# Patient Record
Sex: Male | Born: 1945
Health system: Southern US, Community
[De-identification: ages and names within clinical notes are randomized; demographics above are authoritative.]

## PROBLEM LIST (undated history)

## (undated) DIAGNOSIS — I471 Supraventricular tachycardia, unspecified: Secondary | ICD-10-CM

## (undated) DIAGNOSIS — I219 Acute myocardial infarction, unspecified: Secondary | ICD-10-CM

## (undated) DIAGNOSIS — I050 Rheumatic mitral stenosis: Secondary | ICD-10-CM

## (undated) DIAGNOSIS — K5792 Diverticulitis of intestine, part unspecified, without perforation or abscess without bleeding: Secondary | ICD-10-CM

## (undated) DIAGNOSIS — I2510 Atherosclerotic heart disease of native coronary artery without angina pectoris: Secondary | ICD-10-CM

## (undated) DIAGNOSIS — E785 Hyperlipidemia, unspecified: Secondary | ICD-10-CM

## (undated) DIAGNOSIS — I350 Nonrheumatic aortic (valve) stenosis: Secondary | ICD-10-CM

## (undated) DIAGNOSIS — I519 Heart disease, unspecified: Secondary | ICD-10-CM

## (undated) DIAGNOSIS — R011 Cardiac murmur, unspecified: Secondary | ICD-10-CM

## (undated) DIAGNOSIS — T4145XA Adverse effect of unspecified anesthetic, initial encounter: Secondary | ICD-10-CM

## (undated) DIAGNOSIS — I10 Essential (primary) hypertension: Secondary | ICD-10-CM

## (undated) DIAGNOSIS — K219 Gastro-esophageal reflux disease without esophagitis: Secondary | ICD-10-CM

## (undated) DIAGNOSIS — J398 Other specified diseases of upper respiratory tract: Secondary | ICD-10-CM

## (undated) DIAGNOSIS — E119 Type 2 diabetes mellitus without complications: Secondary | ICD-10-CM

## (undated) DIAGNOSIS — M1990 Unspecified osteoarthritis, unspecified site: Secondary | ICD-10-CM

## (undated) DIAGNOSIS — J189 Pneumonia, unspecified organism: Secondary | ICD-10-CM

## (undated) DIAGNOSIS — R739 Hyperglycemia, unspecified: Secondary | ICD-10-CM

## (undated) DIAGNOSIS — N184 Chronic kidney disease, stage 4 (severe): Secondary | ICD-10-CM

## (undated) DIAGNOSIS — N189 Chronic kidney disease, unspecified: Secondary | ICD-10-CM

## (undated) DIAGNOSIS — J45909 Unspecified asthma, uncomplicated: Secondary | ICD-10-CM

## (undated) DIAGNOSIS — G43009 Migraine without aura, not intractable, without status migrainosus: Secondary | ICD-10-CM

## (undated) DIAGNOSIS — T8859XA Other complications of anesthesia, initial encounter: Secondary | ICD-10-CM

## (undated) DIAGNOSIS — G4730 Sleep apnea, unspecified: Secondary | ICD-10-CM

## (undated) HISTORY — PX: CORONARY ARTERY BYPASS GRAFT: SHX141

## (undated) HISTORY — DX: Unspecified osteoarthritis, unspecified site: M19.90

## (undated) HISTORY — DX: Diverticulitis of intestine, part unspecified, without perforation or abscess without bleeding: K57.92

## (undated) HISTORY — PX: CATARACT EXTRACTION: SUR2

## (undated) HISTORY — PX: REPAIR KNEE LIGAMENT: SUR1188

## (undated) HISTORY — PX: TONSILLECTOMY: SUR1361

## (undated) HISTORY — DX: Acute myocardial infarction, unspecified: I21.9

## (undated) HISTORY — DX: Migraine without aura, not intractable, without status migrainosus: G43.009

## (undated) HISTORY — DX: Hyperlipidemia, unspecified: E78.5

## (undated) HISTORY — DX: Type 2 diabetes mellitus without complications: E11.9

## (undated) HISTORY — DX: Essential (primary) hypertension: I10

## (undated) HISTORY — DX: Hyperglycemia, unspecified: R73.9

## (undated) HISTORY — DX: Sleep apnea, unspecified: G47.30

## (undated) HISTORY — DX: Heart disease, unspecified: I51.9

## (undated) HISTORY — DX: Gastro-esophageal reflux disease without esophagitis: K21.9

## (undated) HISTORY — PX: OTHER SURGICAL HISTORY: SHX169

---
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
## 1994-04-30 ENCOUNTER — Encounter: Payer: Self-pay | Admitting: Cardiology

## 1994-05-03 ENCOUNTER — Encounter: Payer: Self-pay | Admitting: Cardiology

## 2007-06-01 ENCOUNTER — Encounter: Payer: Self-pay | Admitting: Family Medicine

## 2009-03-01 ENCOUNTER — Encounter: Payer: Self-pay | Admitting: Family Medicine

## 2009-04-12 ENCOUNTER — Ambulatory Visit: Payer: Self-pay | Admitting: Family Medicine

## 2009-04-12 ENCOUNTER — Encounter: Admission: RE | Admit: 2009-04-12 | Discharge: 2009-04-12 | Payer: Self-pay | Admitting: Family Medicine

## 2009-04-12 DIAGNOSIS — I10 Essential (primary) hypertension: Secondary | ICD-10-CM | POA: Insufficient documentation

## 2009-04-12 DIAGNOSIS — K219 Gastro-esophageal reflux disease without esophagitis: Secondary | ICD-10-CM | POA: Insufficient documentation

## 2009-04-12 DIAGNOSIS — I25708 Atherosclerosis of coronary artery bypass graft(s), unspecified, with other forms of angina pectoris: Secondary | ICD-10-CM | POA: Insufficient documentation

## 2009-04-12 DIAGNOSIS — E1169 Type 2 diabetes mellitus with other specified complication: Secondary | ICD-10-CM | POA: Insufficient documentation

## 2009-04-12 DIAGNOSIS — E119 Type 2 diabetes mellitus without complications: Secondary | ICD-10-CM | POA: Insufficient documentation

## 2009-04-12 DIAGNOSIS — Z8719 Personal history of other diseases of the digestive system: Secondary | ICD-10-CM | POA: Insufficient documentation

## 2009-04-12 DIAGNOSIS — I25810 Atherosclerosis of coronary artery bypass graft(s) without angina pectoris: Secondary | ICD-10-CM

## 2009-04-12 DIAGNOSIS — E785 Hyperlipidemia, unspecified: Secondary | ICD-10-CM

## 2009-04-12 DIAGNOSIS — N289 Disorder of kidney and ureter, unspecified: Secondary | ICD-10-CM | POA: Insufficient documentation

## 2009-04-12 DIAGNOSIS — M109 Gout, unspecified: Secondary | ICD-10-CM | POA: Insufficient documentation

## 2009-04-17 ENCOUNTER — Ambulatory Visit: Payer: Self-pay | Admitting: Cardiology

## 2009-04-19 LAB — CONVERTED CEMR LAB
ALT: 17 units/L (ref 0–53)
AST: 20 units/L (ref 0–37)
Albumin: 4.6 g/dL (ref 3.5–5.2)
Calcium: 9.5 mg/dL (ref 8.4–10.5)
Chloride: 103 meq/L (ref 96–112)
Hemoglobin: 14.2 g/dL (ref 13.0–17.0)
Hgb A1c MFr Bld: 6.1 % (ref 4.6–6.1)
Platelets: 221 10*3/uL (ref 150–400)
Potassium: 3.4 meq/L — ABNORMAL LOW (ref 3.5–5.3)
RDW: 12.9 % (ref 11.5–15.5)
WBC: 8.1 10*3/uL (ref 4.0–10.5)

## 2009-04-23 ENCOUNTER — Encounter: Payer: Self-pay | Admitting: Cardiology

## 2009-05-22 ENCOUNTER — Ambulatory Visit: Payer: Self-pay

## 2009-05-22 ENCOUNTER — Encounter: Payer: Self-pay | Admitting: Cardiology

## 2009-06-07 ENCOUNTER — Telehealth: Payer: Self-pay | Admitting: Family Medicine

## 2009-07-19 ENCOUNTER — Ambulatory Visit: Payer: Self-pay | Admitting: Family Medicine

## 2009-08-01 LAB — CONVERTED CEMR LAB
AST: 31 units/L (ref 0–37)
Albumin: 4.3 g/dL (ref 3.5–5.2)
Calcium: 9.8 mg/dL (ref 8.4–10.5)
Cholesterol: 166 mg/dL (ref 0–200)
Creatinine, Ser: 1.1 mg/dL (ref 0.4–1.5)
Glucose, Bld: 118 mg/dL — ABNORMAL HIGH (ref 70–99)
HDL: 42.2 mg/dL (ref >39.00)
Triglycerides: 163 mg/dL — ABNORMAL HIGH (ref 0.0–149.0)
VLDL: 32.6 mg/dL (ref 0.0–40.0)

## 2009-10-25 ENCOUNTER — Telehealth: Payer: Self-pay | Admitting: Family Medicine

## 2009-11-29 ENCOUNTER — Ambulatory Visit: Payer: Self-pay | Admitting: Cardiovascular Disease

## 2009-11-30 ENCOUNTER — Encounter: Payer: Self-pay | Admitting: Cardiovascular Disease

## 2009-12-04 ENCOUNTER — Telehealth (INDEPENDENT_AMBULATORY_CARE_PROVIDER_SITE_OTHER): Payer: Self-pay | Admitting: *Deleted

## 2009-12-05 ENCOUNTER — Encounter (HOSPITAL_COMMUNITY): Admission: RE | Admit: 2009-12-05 | Discharge: 2010-02-06 | Payer: Self-pay | Admitting: Cardiovascular Disease

## 2009-12-05 ENCOUNTER — Ambulatory Visit: Payer: Self-pay | Admitting: Cardiovascular Disease

## 2009-12-05 ENCOUNTER — Ambulatory Visit: Payer: Self-pay | Admitting: Internal Medicine

## 2009-12-05 ENCOUNTER — Ambulatory Visit: Payer: Self-pay

## 2009-12-10 ENCOUNTER — Telehealth: Payer: Self-pay | Admitting: Cardiovascular Disease

## 2009-12-11 LAB — CONVERTED CEMR LAB
Alkaline Phosphatase: 59 units/L (ref 39–117)
Bilirubin, Direct: 0.2 mg/dL (ref 0.0–0.3)
Direct LDL: 75.7 mg/dL
GFR calc non Af Amer: 58.67 mL/min (ref >60)
Glucose, Bld: 113 mg/dL — ABNORMAL HIGH (ref 70–99)
HDL: 43.1 mg/dL (ref >39.00)
Potassium: 3.8 meq/L (ref 3.5–5.1)
Sodium: 143 meq/L (ref 135–145)
Total Protein: 7.8 g/dL (ref 6.0–8.3)

## 2010-03-06 ENCOUNTER — Ambulatory Visit: Payer: Self-pay | Admitting: Family Medicine

## 2010-03-07 LAB — CONVERTED CEMR LAB
ALT: 17 units/L (ref 0–53)
Cholesterol: 193 mg/dL (ref 0–200)
HDL: 42.2 mg/dL (ref >39.00)
Triglycerides: 249 mg/dL — ABNORMAL HIGH (ref 0.0–149.0)

## 2010-03-13 ENCOUNTER — Ambulatory Visit: Payer: Self-pay | Admitting: Family Medicine

## 2010-03-15 ENCOUNTER — Ambulatory Visit: Payer: Self-pay | Admitting: Internal Medicine

## 2010-03-15 DIAGNOSIS — G4733 Obstructive sleep apnea (adult) (pediatric): Secondary | ICD-10-CM | POA: Insufficient documentation

## 2010-03-18 ENCOUNTER — Ambulatory Visit: Payer: Self-pay | Admitting: Internal Medicine

## 2010-04-17 ENCOUNTER — Encounter: Payer: Self-pay | Admitting: Internal Medicine

## 2010-04-17 ENCOUNTER — Ambulatory Visit (HOSPITAL_BASED_OUTPATIENT_CLINIC_OR_DEPARTMENT_OTHER): Admission: RE | Admit: 2010-04-17 | Discharge: 2010-04-17 | Payer: Self-pay | Admitting: Internal Medicine

## 2010-04-20 ENCOUNTER — Ambulatory Visit: Payer: Self-pay | Admitting: Internal Medicine

## 2010-05-14 ENCOUNTER — Ambulatory Visit: Payer: Self-pay | Admitting: Internal Medicine

## 2010-05-15 ENCOUNTER — Encounter: Payer: Self-pay | Admitting: Internal Medicine

## 2010-05-23 ENCOUNTER — Encounter: Payer: Self-pay | Admitting: Internal Medicine

## 2010-05-27 ENCOUNTER — Ambulatory Visit: Payer: Self-pay | Admitting: Cardiovascular Disease

## 2010-06-20 ENCOUNTER — Ambulatory Visit: Payer: Self-pay | Admitting: Internal Medicine

## 2010-07-03 ENCOUNTER — Telehealth: Payer: Self-pay | Admitting: Family Medicine

## 2010-07-05 ENCOUNTER — Encounter: Payer: Self-pay | Admitting: Family Medicine

## 2010-08-06 ENCOUNTER — Ambulatory Visit: Payer: Self-pay | Admitting: Internal Medicine

## 2010-08-12 ENCOUNTER — Ambulatory Visit: Payer: Self-pay | Admitting: Internal Medicine

## 2010-09-04 ENCOUNTER — Ambulatory Visit: Payer: Self-pay | Admitting: Family Medicine

## 2010-09-09 LAB — CONVERTED CEMR LAB
ALT: 27 units/L (ref 0–53)
AST: 26 units/L (ref 0–37)
BUN: 32 mg/dL — ABNORMAL HIGH (ref 6–23)
Chloride: 103 meq/L (ref 96–112)
GFR calc non Af Amer: 52.44 mL/min — ABNORMAL LOW (ref >60.00)
Hgb A1c MFr Bld: 6.4 % (ref 4.6–6.5)
Phosphorus: 3.2 mg/dL (ref 2.3–4.6)
Sodium: 141 meq/L (ref 135–145)
VLDL: 34 mg/dL (ref 0.0–40.0)

## 2010-09-11 ENCOUNTER — Ambulatory Visit
Admission: RE | Admit: 2010-09-11 | Discharge: 2010-09-11 | Payer: Self-pay | Source: Home / Self Care | Attending: Family Medicine | Admitting: Family Medicine

## 2010-10-01 ENCOUNTER — Ambulatory Visit
Admission: RE | Admit: 2010-10-01 | Discharge: 2010-10-01 | Payer: Self-pay | Source: Home / Self Care | Attending: Internal Medicine | Admitting: Internal Medicine

## 2010-10-08 NOTE — Assessment & Plan Note (Signed)
Summary: F/U BRONCHITIS/CLE   Vital Signs:  Patient profile:   65 year old male Weight:      231.75 pounds O2 Sat:      98 % on Room air Temp:     97.6 degrees F oral Pulse rate:   74 / minute Pulse rhythm:   regular BP sitting:   122 / 70  (left arm) Cuff size:   large  Vitals Entered By: Maudie Mercury Dance CMA ([place]) (August 12, 2010 12:14 PM)  O2 Flow:  Room air CC: Recheck-not better   History of Present Illness: CC: recheck  4wk hx cough.  Morning better, afternoon increased cough and feels chest tight.  Cough continued, intermittently productive of yellow sputum.  Seen last week, thought asthmatic bronchitis.  put on zpack and told to use albuterol.  No more HA.  Still using mucinex with fluid.  Able to sleep at night.   thinks albuterol is only thing that helps open chest up.  finished zpack, feels has helped some, but day after completed felt worse.  affecting ability to exercises as coughing fits occur when steps outside as well.  Using CPAP per pulm.   already taking cetirizine daily.  No fevers/chills.  No abd pain, no hemoptysis, weight stable (actually up), appetite ok.  No PNDrip or congestion.  No smokers at home, no asthma/COPD hx.  + h/o wheezing with bronchitis in past.  tends to get bronchitis in winter, then lasts several months before goes away.  4 wks ago went to [place], prior had root canal and completed course of amoxicillin.  Current Medications (verified): 1)  Lutein 20 Mg Caps (Lutein) .Marland Kitchen.. 1 By Mouth Once Daily 2)  Levitra 20 Mg Tabs (Vardenafil Hcl) .... As Needed 3)  Aspirin 81 Mg  Tabs (Aspirin) .... Take 1 Tablet By Mouth Once A Day 4)  Crestor 40 Mg Tabs (Rosuvastatin Calcium) .Marland Kitchen.. 1 Tab By Mouth Once Daily 5)  Zetia 10 Mg Tabs (Ezetimibe) .... Take One Tablet By Mouth Daily. 6)  Omeprazole 20 Mg Cpdr (Omeprazole) .... Take 1 Tablet By Mouth Two Times A Day 7)  Metoprolol Tartrate 50 Mg Tabs (Metoprolol Tartrate) .... 1/2  Tab Two Times A Day By  Mouth 8)  Allopurinol 100 Mg Tabs (Allopurinol) .... .one Tab By Mouth Two Times A Day 9)  Chlorthalidone 50 Mg Tabs (Chlorthalidone) .... 1/2 Tab By Mouth Daily 10)  Glucosamine 500 Mg Caps (Glucosamine Sulfate) .... 2 Tab Once Daily 11)  Fish Oil 1000 Mg Caps (Omega-3 Fatty Acids) .Marland Kitchen.. 1 Cap Two Times A Day 12)  Vitamin C 500 Mg  Tabs (Ascorbic Acid) .... Take 1 Tablet By Mouth Once A Day 13)  Vitamin D 1000 Unit  Tabs (Cholecalciferol) .... Take 1 Tablet By Mouth Once A Day 14)  Tylenol Extra Strength 500 Mg Tabs (Acetaminophen) .Marland Kitchen.. 1 Tab Two Times A Day 15)  Klor-Con M20 20 Meq Cr-Tabs (Potassium Chloride Crys Cr) .Marland Kitchen.. 1 Tab By Mouth Daily 16)  Cetirizine Hcl 10 Mg Tabs (Cetirizine Hcl) .Marland Kitchen.. 1 By Mouth Once Daily 17)  Cpap  11 [facility] 18)  Lisinopril 10 Mg Tabs (Lisinopril) .... Take One Tablet By Mouth Daily 19)  Ventolin Hfa 108 (90 Base) Mcg/act Aers (Albuterol Sulfate) .... 2 Puffs Every 4-6 Hours As Needed Sob  Allergies: 1)  ! Percocet  Past History:  Past Medical History: Last updated: 05/14/2010 1.  CAD s/p CABG 1997 x 5.  Done in [place], [place].  Myoview in  2006 in ? [place] was normal per patient.  2.  Diverticulitis, hx of 3.  Gout 4.  Hyperlipidemia 5.  Hypertension 6.  osteoarthritis: knee OA -- injections in past / cortisone  7.  GERD  8.  atypical migraine - only gets aura  9.  hyperglycemia -- borderline DM  10.  CKD 11. Obstructive sleep apnea- NPSG 04/17/10- Moderate OSA AHI 36.3/hr,,,,CPAP 2. Cough- PFT 03/18/10- Mild obstructive airways dz w/o resp to BD. FEV1 2.65/ 86%; Ratio 0.77  Social History: Last updated: 03/15/2010 Retired but [employer] with wife.  ReMarried- 1st wife died Alcohol use-yes 1 drink per day or less  never smoked - occasional cigar Drug use-no Regular exercise-yes-ballroom dancing , ping pong , yoga , Charlean Sanfilippo   Review of Systems       per HPI  Physical Exam  General:  Well-developed,well-nourished,in no acute  distress; alert,appropriate and cooperative throughout examination, nontoxic.  vitals reviewed and stable.  O2 sat 98%RA Head:  normocephalic, atraumatic, and no abnormalities observed.  sinuses NT Eyes:  perrla, eomi, no injection Ears:  TMs clear Nose:  nares clear, no congestion Mouth:  MMM, no pharyngeal erythema/exudates Neck:  resolving R AC LAD Lungs:  minimal exp wheezing, very slightly tight, no crackles/rales, normal WOB Heart:  Normal rate and regular rhythm. S1 and S2 normal without gallop, murmur, click, rub or other extra sounds. Pulses:  2+ rad pulses, brisk cap refill Extremities:  no pedal edema Skin:  Intact without suspicious lesions or rashes   Impression & Recommendations:  Problem # 1:  COUGH (ICD-786.2) previously thought asthmatic bronchitis, consistent with improvement with albuterol.  no need for further abx (s/p zpack.)  add prednisone for possible RAD component.  continue mucinex.  schedule albuterol.  red flags to return discussed.  On ACEI, but this isn't dry cough.  already on antihistamine for possible PND component.  h/o OSA on CPAP and doing well.  no h/o smoking.  check CXR given going on for 4 wks, recent international trip, continued cough despite abx.  nothing acute on my read.  advised will call if any change in plan based on radiologist's read.  Orders: T-2 View CXR (71020TC)  Complete Medication List: 1)  Lutein 20 Mg Caps (Lutein) .Marland Kitchen.. 1 by mouth once daily 2)  Levitra 20 Mg Tabs (Vardenafil hcl) .... As needed 3)  Aspirin 81 Mg Tabs (Aspirin) .... Take 1 tablet by mouth once a day 4)  Crestor 40 Mg Tabs (Rosuvastatin calcium) .Marland Kitchen.. 1 tab by mouth once daily 5)  Zetia 10 Mg Tabs (Ezetimibe) .... Take one tablet by mouth daily. 6)  Omeprazole 20 Mg Cpdr (Omeprazole) .... Take 1 tablet by mouth two times a day 7)  Metoprolol Tartrate 50 Mg Tabs (Metoprolol tartrate) .... 1/2  tab two times a day by mouth 8)  Allopurinol 100 Mg Tabs (Allopurinol)  .... .one tab by mouth two times a day 9)  Chlorthalidone 50 Mg Tabs (Chlorthalidone) .... 1/2 tab by mouth daily 10)  Glucosamine 500 Mg Caps (Glucosamine sulfate) .... 2 tab once daily 11)  Fish Oil 1000 Mg Caps (Omega-3 fatty acids) .Marland Kitchen.. 1 cap two times a day 12)  Vitamin C 500 Mg Tabs (Ascorbic acid) .... Take 1 tablet by mouth once a day 13)  Vitamin D 1000 Unit Tabs (Cholecalciferol) .... Take 1 tablet by mouth once a day 14)  Tylenol Extra Strength 500 Mg Tabs (Acetaminophen) .Marland Kitchen.. 1 tab two times a day 15)  Klor-con M20 20 Meq Cr-tabs (Potassium chloride crys cr) .Marland Kitchen.. 1 tab by mouth daily 16)  Cetirizine Hcl 10 Mg Tabs (Cetirizine hcl) .Marland Kitchen.. 1 by mouth once daily 17)  Cpap [facility]  18)  Lisinopril 10 Mg Tabs (Lisinopril) .... Take one tablet by mouth daily 19)  Ventolin Hfa 108 (90 Base) Mcg/act Aers (Albuterol sulfate) .... 2 puffs every 4-6 hours as needed sob 20)  Prednisone 50 Mg Tabs (Prednisone) .... Take one by mouth daily x 7 days  Patient Instructions: 1)  continued asthmatic bronchitis.  2)  Continue guaifenesin or mucinex. 3)  Push fluids and get plenty of rest. 4)  Use medication as prescribed: use albuterol inhaler 2 puffs every 4-6 hours scheduled for next 2 days, start steroid course x 7 days.  5)  Please return if you are not improving as expected, or if you have high fevers (>101.5) or difficulty swallowing/breathing. 6)  Call clinic with questions.  Pleasure to see you today!  Prescriptions: PREDNISONE 50 MG TABS (PREDNISONE) take one by mouth daily x 7 days  #7 x 0   Entered and Authorized by:   Ria Bush  MD   Signed by:   Ria Bush  MD on 08/12/2010   Method used:   Electronically to        [facility]. [phone]* (retail)       [address]       [place], [place]  [address]       Ph: [phone]       Fax: [phone]   RxID:   [id number]    Orders Added: 1)  Est. Patient Level III [id number] 2)  T-2 View CXR  [[id number]]    Current Allergies (reviewed today): ! PERCOCET

## 2010-10-08 NOTE — Letter (Signed)
Summary: CMN for CPAP Supplies/[facility]  CMN for CPAP Supplies/[facility]   Imported By: Phillis Knack 05/27/2010 14:14:01  _____________________________________________________________________  External Attachment:    Type:   Image     Comment:   External Document

## 2010-10-08 NOTE — Assessment & Plan Note (Signed)
Summary: FOLLOW UP AFTER LABS/RI   Vital Signs:  Patient profile:   65 year old male Height:      71 inches Weight:      218.25 pounds BMI:     30.55 Temp:     97.9 degrees F oral Pulse rate:   76 / minute Pulse rhythm:   regular BP sitting:   116 / 74  (left arm) Cuff size:   large  Vitals Entered By: Ozzie Hoyle LPN (July  6, 624THL [id number] AM) CC: f/u after labs and wants to discuss getting a sleep study   History of Present Illness: here for f/u of HTN and lipids and hyperglycemia  has been feeling fine - no complaints   bp in good control 116/74  lipids up a bit with trig 248 and LDL 109 (was 75)- cardiology wants it below 70 has not changed eating habits  eats red meat occas -- once every 2 weeks  little to no fried foods  eggs - - did eat some eggs for a while - back to egg whites now not much high fat dairy  stays away from shellfish  cardiologist mentioned adding another pill - ? zetia after stress test    AIC is down to 6.2- good control   wt is up 5 lb-- has increased recently - not exercising as much as he was - too busy    sleep issues --has dx of sleep apnea  was on cpap in past  this changes with his weight -- and trouble adjusting to it so has not used for 3-5 years  does startle his wife with breating and snoring -- not severe but comes and goes  is not overly tired during the day- but does fall asleep when he sits to read    Allergies: 1)  ! Percocet   Impression & Recommendations:  Problem # 1:  SLEEP APNEA (ICD-780.57) Assessment New ref for re- eval and tx to pulm  some snoring and witnessed apnea disc how this aff the CV system as well Orders: Sleep Disorder Referral (Sleep Disorder)  Problem # 2:  HYPERGLYCEMIA (ICD-790.29) Assessment: Improved  this continues to improve disc healthy diet (low simple sugar/ choose complex carbs/ low sat fat) diet and exercise in detail  lab and f/u in 6 mo  Labs Reviewed: Creat: 1.3 (12/05/2009)      Problem # 3:  HYPERLIPIDEMIA (ICD-272.4) Assessment: Deteriorated  this worsened despite good diet- may be genetic  pt is holding off on zetia until he speaks with his cardiologist again disc low sat fat diet- doing well with that  His updated medication list for this problem includes:    Crestor 40 Mg Tabs (Rosuvastatin calcium) .Marland Kitchen... 1 tab by mouth once daily    Niaspan 500 Mg Cr-tabs (Niacin (antihyperlipidemic)) .Marland Kitchen... Take 1 tablet by mouth once a day  Labs Reviewed: SGOT: 22 (03/06/2010)   SGPT: 17 (03/06/2010)   HDL:42.20 (03/06/2010), 43.10 (12/05/2009)  LDL:91 (07/19/2009), 85 (04/17/2009)  Chol:193 (03/06/2010), 145 (12/05/2009)  Trig:249.0 (03/06/2010), 207.0 (12/05/2009)  Complete Medication List: 1)  Lutein 20 Mg Caps (Lutein) .Marland Kitchen.. 1 by mouth once daily 2)  Levitra 20 Mg Tabs (Vardenafil hcl) .... As needed 3)  Aspirin 81 Mg Tabs (Aspirin) .... Take 1 tablet by mouth once a day 4)  Crestor 40 Mg Tabs (Rosuvastatin calcium) .Marland Kitchen.. 1 tab by mouth once daily 5)  Niaspan 500 Mg Cr-tabs (Niacin (antihyperlipidemic)) .... Take 1 tablet by mouth once a day  6)  Omeprazole 20 Mg Cpdr (Omeprazole) .... Take 1 tablet by mouth two times a day 7)  Metoprolol Tartrate 50 Mg Tabs (Metoprolol tartrate) .... 1/2  tab two times a day by mouth 8)  Allopurinol 100 Mg Tabs (Allopurinol) .... .one tab by mouth two times a day 9)  Chlorthalidone 50 Mg Tabs (Chlorthalidone) .... 1/2 tab by mouth daily 10)  Glucosamine 500 Mg Caps (Glucosamine sulfate) .... 2 tab once daily 11)  Fish Oil 1000 Mg Caps (Omega-3 fatty acids) .Marland Kitchen.. 1 cap two times a day 12)  Vitamin C 500 Mg Tabs (Ascorbic acid) .... Take 1 tablet by mouth once a day 13)  Vitamin D 1000 Unit Tabs (Cholecalciferol) .... Take 1 tablet by mouth once a day 14)  Tylenol Extra Strength 500 Mg Tabs (Acetaminophen) .Marland Kitchen.. 1 tab two times a day 15)  Klor-con M20 20 Meq Cr-tabs (Potassium chloride crys cr) .Marland Kitchen.. 1 tab by mouth daily 16)  Cetirizine  Hcl 10 Mg Tabs (Cetirizine hcl) .Marland Kitchen.. 1 by mouth once daily  Patient Instructions: 1)  we will do sleep clinic referral at check out  2)  no change in medicines  3)  schedule fasting labs in 6 months lipids/ast/alt/renal / AIC for hyperglycemia and lipids -- then follow up  4)  work hard on healthy diet and exercise - to loose the 5 lb you gained   Current Allergies (reviewed today): ! PERCOCET  Appended Document: FOLLOW UP AFTER LABS/RI     Allergies: 1)  ! Percocet  Past History:  Past Medical History: Last updated: 03/15/2010 1.  CAD s/p CABG 1997 x 5.  Done in [place], [place].  Myoview in 2006 in ? [place] was normal per patient.  2.  Diverticulitis, hx of 3.  Gout 4.  Hyperlipidemia 5.  Hypertension 6.  osteoarthritis: knee OA -- injections in past / cortisone  7.  GERD  8.  atypical migraine - only gets aura  9.  hyperglycemia -- borderline DM  10.  CKD 11. Obstructive sleep apnea 2. Cough  Past Surgical History: Last updated: 03/15/2010  1997-98 Open heart surgery (5 bypass) colonosc 2007 flex sig - divertic 09  EGD in past 2007 - gerd  Tonsillectomy Right knee ligament repair  Family History: Last updated: 04/12/2009 Family History of Heart disease (parent) father had stroke, and heart disease (in early 17s)  mother - alzheimers   Social History: Last updated: 03/15/2010 Retired but [employer] pl[employer] with wife.  ReMarried- 1st wife died Alcohol use-yes 1 drink per day or less  never smoked - occasional cigar Drug use-no Regular exercise-yes-ballroom dancing , ping pong , yoga , Richard Simmons   Risk Factors: Exercise: yes (04/12/2009)  Risk Factors: Smoking Status: never (03/15/2010)  Review of Systems General:  Complains of fatigue; denies loss of appetite and malaise. Eyes:  Denies blurring and eye irritation. CV:  Denies chest pain or discomfort, lightheadness, and palpitations. Resp:  Denies cough, sputum productive, and wheezing. GI:   Denies abdominal pain, bloody stools, change in bowel habits, and indigestion. GU:  Denies urinary frequency. MS:  Denies muscle aches and cramps. Derm:  Denies itching, lesion(s), poor wound healing, and rash. Neuro:  Denies numbness and tingling. Psych:  Denies anxiety and depression. Endo:  Denies cold intolerance, excessive thirst, excessive urination, and heat intolerance. Heme:  Denies abnormal bruising and bleeding.  Physical Exam  General:  Well-developed,well-nourished,in no acute distress; alert,appropriate and cooperative throughout examination Head:  normocephalic, atraumatic, and no abnormalities  observed.   Eyes:  vision grossly intact, pupils equal, pupils round, and pupils reactive to light.  no conjunctival pallor, injection or icterus  Nose:  no nasal discharge.   Mouth:  pharynx pink and moist.   Neck:  supple with full rom and no masses or thyromegally, no JVD or carotid bruit  Chest Wall:  No deformities, masses, tenderness or gynecomastia noted. Lungs:  Normal respiratory effort, chest expands symmetrically. Lungs are clear to auscultation, no crackles or wheezes. Heart:  Normal rate and regular rhythm. S1 and S2 normal without gallop, murmur, click, rub or other extra sounds. Abdomen:  Bowel sounds positive,abdomen soft and non-tender without masses, organomegaly or hernias noted. no renal bruits  Msk:  No deformity or scoliosis noted of thoracic or lumbar spine.   Pulses:  R and L carotid,radial,femoral,dorsalis pedis and posterior tibial pulses are full and equal bilaterally Extremities:  No clubbing, cyanosis, edema, or deformity noted with normal full range of motion of all joints.   Neurologic:  sensation intact to light touch, gait normal, and DTRs symmetrical and normal.   Skin:  Intact without suspicious lesions or rashes Cervical Nodes:  No lymphadenopathy noted Inguinal Nodes:  No significant adenopathy Psych:  normal affect, talkative and pleasant     Complete Medication List: 1)  Lutein 20 Mg Caps (Lutein) .Marland Kitchen.. 1 by mouth once daily 2)  Levitra 20 Mg Tabs (Vardenafil hcl) .... As needed 3)  Aspirin 81 Mg Tabs (Aspirin) .... Take 1 tablet by mouth once a day 4)  Crestor 40 Mg Tabs (Rosuvastatin calcium) .Marland Kitchen.. 1 tab by mouth once daily 5)  Niaspan 500 Mg Cr-tabs (Niacin (antihyperlipidemic)) .... Take 1 tablet by mouth once a day 6)  Omeprazole 20 Mg Cpdr (Omeprazole) .... Take 1 tablet by mouth two times a day 7)  Metoprolol Tartrate 50 Mg Tabs (Metoprolol tartrate) .... 1/2  tab two times a day by mouth 8)  Allopurinol 100 Mg Tabs (Allopurinol) .... .one tab by mouth two times a day 9)  Chlorthalidone 50 Mg Tabs (Chlorthalidone) .... 1/2 tab by mouth daily 10)  Glucosamine 500 Mg Caps (Glucosamine sulfate) .... 2 tab once daily 11)  Fish Oil 1000 Mg Caps (Omega-3 fatty acids) .Marland Kitchen.. 1 cap two times a day 12)  Vitamin C 500 Mg Tabs (Ascorbic acid) .... Take 1 tablet by mouth once a day 13)  Vitamin D 1000 Unit Tabs (Cholecalciferol) .... Take 1 tablet by mouth once a day 14)  Tylenol Extra Strength 500 Mg Tabs (Acetaminophen) .Marland Kitchen.. 1 tab two times a day 15)  Klor-con M20 20 Meq Cr-tabs (Potassium chloride crys cr) .Marland Kitchen.. 1 tab by mouth daily 16)  Cetirizine Hcl 10 Mg Tabs (Cetirizine hcl) .Marland Kitchen.. 1 by mouth once daily

## 2010-10-08 NOTE — Assessment & Plan Note (Signed)
Summary: sleep/cb   Copy to:  [facility] Provider/Referring Provider:  [name]  CC:  Sleep consult- Dr. Myrla Halsted study done in 2004 at [place] in [place].Marland Kitchen  History of Present Illness: March 15, 2010- 65 yoM seen with his wife today on kind referral by [name] because of breathing concerns with sleep.  He and second wife are newly married, heading for honeymoon prolonged cruise. Never smoker told by his wife that he stops breathing and gasps in his sleep. Occasionaklly  aware that he is stuggling his way up out of sleep and flails to get a breath. Snores, but not loudly. Nocturia x 2. Feels unrested. Denies obvious nocturnal wheeze or reflux. NPSG at [facility] in [facility] around 2004 with dx of OSA. Tried CPAP a few months but airflow bothered him and first wife. Dyspneic if he bends over, but no hx of asthma or routine wheeze. On waking in the AM he has bothersome cough with scant white sputum. Takes him an hour or two for this to pass, then he is mostly clear through the day. Not much nasal congestion. Hasn't needed extra pillows or noted swelling ankles. Hx treated GERD, MI with CABG, HTN, tonsillectomy.  Preventive Screening-Counseling & Management  Alcohol-Tobacco     Smoking Status: never  Comments: Occasional cigar  Current Medications (verified): 1)  Lutein 20 Mg Caps (Lutein) .Marland Kitchen.. 1 By Mouth Once Daily 2)  Levitra 20 Mg Tabs (Vardenafil Hcl) .... As Needed 3)  Aspirin 81 Mg  Tabs (Aspirin) .... Take 1 Tablet By Mouth Once A Day 4)  Crestor 40 Mg Tabs (Rosuvastatin Calcium) .Marland Kitchen.. 1 Tab By Mouth Once Daily 5)  Niaspan 500 Mg Cr-Tabs (Niacin (Antihyperlipidemic)) .... Take 1 Tablet By Mouth Once A Day 6)  Omeprazole 20 Mg Cpdr (Omeprazole) .... Take 1 Tablet By Mouth Two Times A Day 7)  Metoprolol Tartrate 50 Mg Tabs (Metoprolol Tartrate) .... 1/2  Tab Two Times A Day By Mouth 8)  Allopurinol 100 Mg Tabs (Allopurinol) .... .one Tab By Mouth Two Times A Day 9)  Chlorthalidone 50 Mg  Tabs (Chlorthalidone) .... 1/2 Tab By Mouth Daily 10)  Glucosamine 500 Mg Caps (Glucosamine Sulfate) .... 2 Tab Once Daily 11)  Fish Oil 1000 Mg Caps (Omega-3 Fatty Acids) .Marland Kitchen.. 1 Cap Two Times A Day 12)  Vitamin C 500 Mg  Tabs (Ascorbic Acid) .... Take 1 Tablet By Mouth Once A Day 13)  Vitamin D 1000 Unit  Tabs (Cholecalciferol) .... Take 1 Tablet By Mouth Once A Day 14)  Tylenol Extra Strength 500 Mg Tabs (Acetaminophen) .Marland Kitchen.. 1 Tab Two Times A Day 15)  Klor-Con M20 20 Meq Cr-Tabs (Potassium Chloride Crys Cr) .Marland Kitchen.. 1 Tab By Mouth Daily 16)  Cetirizine Hcl 10 Mg Tabs (Cetirizine Hcl) .Marland Kitchen.. 1 By Mouth Once Daily  Allergies (verified): 1)  ! 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  ReMarried- 1st wife died Alcohol use-yes 1 drink per day or less  never smoked - occasional cigar Drug use-no Regular exercise-yes-ballroom dancing , ping pong , yoga , Richard Simmons   Risk Factors: Exercise: yes (04/12/2009)  Risk Factors: Smoking Status: never (03/15/2010)  Past Medical History: 1.  CAD s/p CABG 1997 x 5.  Done in [place], [place].  Myoview in 2006 in ?  [place] was normal per patient.  2.  Diverticulitis, hx of 3.  Gout 4.  Hyperlipidemia 5.  Hypertension 6.  osteoarthritis: knee OA -- injections in past / cortisone  7.  GERD  8.  atypical migraine - only gets aura  9.  hyperglycemia -- borderline DM  10.  CKD 11. Obstructive sleep apnea 2. 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  ReMarried- 1st wife died Alcohol use-yes 1 drink per day or less  never smoked  - occasional cigar Drug use-no Regular exercise-yes-ballroom dancing , ping pong , yoga , Charlean Sanfilippo  Smoking Status:  never  Review of Systems      See HPI       The patient complains of productive cough.  The patient denies shortness of breath with activity, shortness of breath at rest, non-productive cough, coughing up blood, chest pain, irregular heartbeats, acid heartburn, indigestion, loss of appetite, weight change, abdominal pain, difficulty swallowing, sore throat, tooth/dental problems, headaches, nasal congestion/difficulty breathing through nose, sneezing, itching, ear ache, anxiety, depression, hand/feet swelling, joint stiffness or pain, rash, change in color of mucus, and fever.    Vital Signs:  Patient profile:   65 year old male Height:      71 inches Weight:      218 pounds BMI:     30.51 O2 Sat:      95 % on Room air Pulse rate:   59 / minute BP sitting:   116 / 74  (right arm) Cuff size:   large  Vitals Entered By: Clayborne Dana CMA (March 15, 2010 9:29 AM)  O2 Flow:  Room air CC: Sleep consult- Dr. Myrla Halsted study done in 2004 at [place] in [place].   Physical Exam  Additional Exam:  General: A/Ox3; pleasant and cooperative, NAD, SKIN: no rash, lesions NODES: no lymphadenopathy HEENT: [place]/AT, EOM- WNL, Conjuctivae- clear, PERRLA, TM-WNL, Nose- clear, Throat- clear and wnl, Mallampati  II, no stridor NECK: Supple w/ fair ROM, JVD- none, normal carotid impulses w/o bruits Thyroid- normal to palpation CHEST: Clear to P&A, no rales or cough HEART: RRR, no m/g/r heard ABDOMEN: Soft and nl; nml bowel sounds; no organomegaly or masses noted, mild overweight [id number], nl pulses, no edema, cyanosis or clubbing NEURO: Grossly intact to observation      Impression & Recommendations:  Problem # 1:  OBSTRUCTIVE SLEEP APNEA (ICD-327.23)  Previously dx'd in 2004 at [place]. Lost to f/u because CPAP wasn't adjusted to comfort. New wife is giving convincing  description. We discussed sleep study and he agrees to go ahead for update and confirmation of diagnosis. We began a discussion of hte medical concerns, emphasizing his responsibility to drive carefully.  Problem # 2:  COUGH (ICD-786.2)  Morning cough may reflect increased lung water overnight, bronhcitis or possibly some reflux. We have discussed and given sample trial of Spiriva to see what effect it has. Meanwhile we will schedule PFT. Several potential etiologies might be relieved by successful treatment of sleep apnea if that is confirmed.   Other Orders: Sleep Disorder Referral (Sleep Disorder) Consultation Level IV [id number])  Patient Instructions: 1)  Please schedule a follow-up appointment in 2 months. 2)  Our [facility] will contact you to schedule a sleep study 3)  Schedule PFT 4)  Sample trial  Spiriva, one daily

## 2010-10-08 NOTE — Progress Notes (Signed)
Summary: pt wants abx  Phone Note Call from Patient Call back at Home Phone [phone]   [name]: Patient Call For: Allena Earing MD Summary of Call: Pt's wife, Izora Gala,  was seen yesterday for bronchitis and given z-pack.  Pt is concerned because they are going out of the country next week and he is worried that he will get sick.  He is asking if he too can get an antibiotic to have just in case.  Uses [facility] in [place]. Initial call taken by: Marty Heck CMA, [place],  July 03, 2010 3:10 PM  Follow-up for Phone Call        sorry- I cannot do that ... where is he going -- is it a developed country where he could get healthcare if needed? Follow-up by: Allena Earing MD,  July 03, 2010 3:41 PM  Additional Follow-up for Phone Call Additional follow up Details #1::        Patient notified as instructed by telephone. Pt said was going to [place] and [place]. Pt to leave next Tues. Pt will call back if develops symptoms.Ozzie Hoyle LPN  October 26, 624THL 4:01 PM

## 2010-10-08 NOTE — Progress Notes (Signed)
Summary: Returning Call  Phone Note Call from Patient Call back at Home Phone [phone]   [name]: Patient Call For: RN Summary of Call: Patient called and said he was returning your call from this morning. Initial call taken by: Shanda Howells,  December 10, 2009 10:57 AM  Follow-up for Phone Call        pt aware of MV results. Follow-up by: Gabriel Cirri, RN, BSN,  December 10, 2009 11:13 AM

## 2010-10-08 NOTE — Assessment & Plan Note (Signed)
Summary: F6M/AMD   Visit Type:  Follow-up Referring Muzammil Bruins:  [facility] Primary Frazier Balfour:  [name]  CC:  Denies chest pain or shortness of breath..  History of Present Illness: 65 yo with history of CAD CABG back in 83 in [place], [place], HTN, hyperlipidemia, presents for routine followup.  Overall, he reports he is doing well. He is active, can walk 2 miles with no chest pain or shortness of breath. He has been gaining weight recently as he does travel, eats out frequently, goes on cruises. Previously he lost weight with [employer].  stress test in March of 2011 shows mildly decreased perfusion in the anterior wall at rest with moderately decreased perfusion with stress. He had no significant symptoms with testing.  EKG shows normal sinus rhythm with rate 61 beats per minute, no significant ST or T wave changes.     Current Medications (verified): 1)  Lutein 20 Mg Caps (Lutein) .Marland Kitchen.. 1 By Mouth Once Daily 2)  Levitra 20 Mg Tabs (Vardenafil Hcl) .... As Needed 3)  Aspirin 81 Mg  Tabs (Aspirin) .... Take 1 Tablet By Mouth Once A Day 4)  Crestor 40 Mg Tabs (Rosuvastatin Calcium) .Marland Kitchen.. 1 Tab By Mouth Once Daily 5)  Niaspan 500 Mg Cr-Tabs (Niacin (Antihyperlipidemic)) .... Take 1 Tablet By Mouth Once A Day 6)  Omeprazole 20 Mg Cpdr (Omeprazole) .... Take 1 Tablet By Mouth Two Times A Day 7)  Metoprolol Tartrate 50 Mg Tabs (Metoprolol Tartrate) .... 1/2  Tab Two Times A Day By Mouth 8)  Allopurinol 100 Mg Tabs (Allopurinol) .... .one Tab By Mouth Two Times A Day 9)  Chlorthalidone 50 Mg Tabs (Chlorthalidone) .... 1/2 Tab By Mouth Daily 10)  Glucosamine 500 Mg Caps (Glucosamine Sulfate) .... 2 Tab Once Daily 11)  Fish Oil 1000 Mg Caps (Omega-3 Fatty Acids) .Marland Kitchen.. 1 Cap Two Times A Day 12)  Vitamin C 500 Mg  Tabs (Ascorbic Acid) .... Take 1 Tablet By Mouth Once A Day 13)  Vitamin D 1000 Unit  Tabs (Cholecalciferol) .... Take 1 Tablet By Mouth Once A Day 14)  Tylenol Extra Strength 500 Mg  Tabs (Acetaminophen) .Marland Kitchen.. 1 Tab Two Times A Day 15)  Klor-Con M20 20 Meq Cr-Tabs (Potassium Chloride Crys Cr) .Marland Kitchen.. 1 Tab By Mouth Daily 16)  Cetirizine Hcl 10 Mg Tabs (Cetirizine Hcl) .Marland Kitchen.. 1 By Mouth Once Daily 17)  Cpap .... At Bedtime  Allergies (verified): 1)  ! Percocet  Past History:  Past Medical History: Last updated: 05/14/2010 1.  CAD s/p CABG 1997 x 5.  Done in [place], [place].  Myoview in 2006 in ? [place] was normal per patient.  2.  Diverticulitis, hx of 3.  Gout 4.  Hyperlipidemia 5.  Hypertension 6.  osteoarthritis: knee OA -- injections in past / cortisone  7.  GERD  8.  atypical migraine - only gets aura  9.  hyperglycemia -- borderline DM  10.  CKD 11. Obstructive sleep apnea- NPSG 04/17/10- Moderate OSA AHI 36.3/hr,,,,CPAP 2. Cough- PFT 03/18/10- Mild obstructive airways dz w/o resp to BD. 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  ReMarried- 1st wife died Alcohol use-yes 1 drink per day or less  never smoked - occasional cigar Drug use-no Regular exercise-yes-ballroom dancing , ping pong , yoga , Richard Simmons   Risk Factors: Exercise: yes (04/12/2009)  Risk Factors: Smoking Status: never (05/14/2010)  Review of Systems  The patient denies fever, weight loss, weight gain, vision loss, decreased hearing, hoarseness, chest pain, syncope, dyspnea on exertion, peripheral edema, prolonged cough, abdominal pain, incontinence, muscle weakness, depression, and enlarged lymph nodes.    Vital Signs:  Patient profile:   65 year old  male Height:      71 inches Weight:      222 pounds BMI:     31.07 Pulse rate:   71 / minute BP sitting:   132 / 85  (left arm) Cuff size:   regular  Vitals Entered By: Dolores Lory, CMA (May 27, 2010 9:26 AM)  Physical Exam  General:  Well developed, well nourished, in no acute distress. Head:  normocephalic and atraumatic Neck:  Neck supple, no JVD. No masses, thyromegaly or abnormal cervical nodes. Lungs:  Clear bilaterally to auscultation and percussion. Heart:  Non-displaced PMI, chest non-tender; regular rate and rhythm, S1, S2 without murmurs, rubs or gallops. Carotid upstroke normal, no bruit.  Pedals normal pulses. No edema, no varicosities. Abdomen:  Bowel sounds positive; abdomen soft and non-tender without masses Msk:  Back normal, normal gait. Muscle strength and tone normal. Pulses:  pulses normal in all 4 extremities Extremities:  No clubbing or cyanosis. Neurologic:  Alert and oriented x 3. Skin:  Intact without lesions or rashes. Psych:  Normal affect.   Impression & Recommendations:  Problem # 1:  CAD (ICD-414.00) history of CAD, bypass surgery, stress test this year showing mild ischemia in the anterior wall. He has no symptoms, is able to exercise without any difficulty. We will hold off on performing a cardiac catheterization. These changes may be secondary to his bypass surgery with severe native disease, collateral filling from a bypass to the anterior wall.  We will stress exercise, diet, low-cholesterol  His updated medication list for this problem includes:    Aspirin 81 Mg Tabs (Aspirin) .Marland Kitchen... Take 1 tablet by mouth once a day    Metoprolol Tartrate 50 Mg Tabs (Metoprolol tartrate) .Marland Kitchen... 1/2  tab two times a day by mouth    Lisinopril 10 Mg Tabs (Lisinopril) .Marland Kitchen... Take one tablet by mouth daily  Orders: EKG w/ Interpretation (93000)  Problem # 2:  HYPERLIPIDEMIA (ICD-272.4) His cholesterol is elevated and not at goal with LDL less than 70.  We will add Zetia 10 mg to his regimen.  His updated medication list for this problem includes:    Crestor 40 Mg Tabs (Rosuvastatin calcium) .Marland Kitchen... 1 tab by mouth once daily    Zetia 10 Mg Tabs (Ezetimibe) .Marland Kitchen... Take one tablet by mouth daily.  Problem # 3:  HYPERTENSION (ICD-401.9) Blood pressure is reasonably well controlled. He does report it is borderline elevated at home. We will add lisinopril 10 mg daily  His updated medication list for this problem includes:    Aspirin 81 Mg Tabs (Aspirin) .Marland Kitchen... Take 1 tablet by mouth once a day    Metoprolol Tartrate 50 Mg Tabs (Metoprolol tartrate) .Marland Kitchen... 1/2  tab two times a day by mouth    Chlorthalidone 50 Mg Tabs (Chlorthalidone) .Marland Kitchen... 1/2 tab by mouth daily    Lisinopril 10 Mg Tabs (Lisinopril) .Marland Kitchen... Take one tablet by mouth  daily  Patient Instructions: 1)  Your physician recommends that you return for a FASTING lipid profile: IN DECEMBER 2011 2)  Your physician has recommended you make the following change in your medication: STOP niaspan START  zetia and lisnopril 3)  Your physician wants you to follow-up in:   6 months You will receive a reminder letter in the mail two months in advance. If you don't receive a letter, please call our office to schedule the follow-up appointment. Prescriptions: LISINOPRIL 10 MG TABS (LISINOPRIL) Take one tablet by mouth daily  #30 x 6   Entered by:   Cordelia Pen, RN   Authorized by:   Esmond Plants MD   Signed by:   Cordelia Pen, RN on 05/27/2010   Method used:   Electronically to        [facility] (retail)       [address] [place], [place]  [address]       Ph: [phone]       Fax: [phone]   RxID:   [id number] ZETIA 10 MG TABS (EZETIMIBE) Take one tablet by mouth daily.  #30 x 11   Entered by:   Cordelia Pen, RN   Authorized by:   Esmond Plants MD   Signed by:   Cordelia Pen, RN on 05/27/2010   Method used:    Electronically to        [id number] (retail)       [address], [place]       [place], [place]  [address]       Ph: [phone]       Fax: [phone]   RxID:   [id number]

## 2010-10-08 NOTE — Progress Notes (Signed)
Summary: Nuclear Pre-Procedure  Phone Note Outgoing Call Call back at [facility] Phone [phone]   Call placed by: Eliezer Lofts, EMT-P,  December 04, 2009 3:40 PM Call placed to: Patient Action Taken: Phone Call Completed Summary of Call: Reviewed information on Myoview Information Sheet (see scanned document for further details).  Spoke with Patient.    Nuclear Med Background Indications for Stress Test: Evaluation for Ischemia, Graft Patency   History: CABG, Echo, Myocardial Perfusion Study  History Comments: '97 CABG x5 '06 MPS '97 Echo: EF=55-60%     Nuclear Pre-Procedure Cardiac Risk Factors: Family History - CAD, Hypertension, Lipids Height (in): 71

## 2010-10-08 NOTE — Assessment & Plan Note (Signed)
Summary: Cardiology Nuclear Study  Nuclear Med Background Indications for Stress Test: Evaluation for Ischemia, Graft Patency   History: CABG, Echo, Myocardial Perfusion Study  History Comments: '97 CABG x5 '06 MPS '97 Echo: EF=55-60%  Symptoms: DOE, Palpitations, SOB    Nuclear Pre-Procedure Cardiac Risk Factors: Family History - CAD, Hypertension, Lipids Caffeine/Decaff Intake: none NPO After: 7:00 PM Lungs: clear IV 0.9% NS with Angio Cath: 20g     IV Site: (R) AC IV Started by: Eliezer Lofts EMT-P Chest Size (in) 42     Height (in): 71 Weight (lb): 213 BMI: 29.81 Tech Comments: Metoprolol held > 24 hours, per Patient.  Nuclear Med Study 1 or 2 day study:  1 day     Stress Test Type:  Stress Reading MD:  Glori Bickers, MD     Referring MD:  T.Gollan Resting Radionuclide:  Technetium 5m Tetrofosmin     Resting Radionuclide Dose:  10.6 mCi  Stress Radionuclide:  Technetium 31m Tetrofosmin     Stress Radionuclide Dose:  33 mCi   Stress Protocol Exercise Time (min):  5:00 min     Max HR:  153 bpm     Predicted Max HR:  123456 bpm  Max Systolic BP: 123456 mm Hg     Percent Max HR:  99.35 %     METS: 7.0 Rate Pressure Product:  25092    Stress Test Technologist:  Perrin Maltese EMT-P     Nuclear Technologist:  Charlton Amor CNMT  Rest Procedure  Myocardial perfusion imaging was performed at rest 45 minutes following the intravenous administration of Myoview Technetium 15m Tetrofosmin.  Stress Procedure  The patient exercised for 5:00. The patient stopped due to fatigue, sob, and denied any chest pain.  There were no significant ST-T wave changes.  Myoview was injected at peak exercise and myocardial perfusion imaging was performed after a brief delay.  QPS Raw Data Images:  Normal; no motion artifact; normal heart/lung ratio. Stress Images:  Moderately reduced uptake in anterior wall Rest Images:  Mildly reduced uptake in the inferior wall Subtraction (SDS):  Possible  small previous anterior infarct with mild peri-infarct ischemia Transient Ischemic Dilatation:  .68  (Normal <1.22)  Lung/Heart Ratio:  .37  (Normal <0.45)  Quantitative Gated Spect Images QGS EDV:  61 ml QGS ESV:  15 ml QGS EF:  76 % QGS cine images:  Normal  Findings Abnormal nuclear study Evidence for anterior (septal apical) ischemia      Overall Impression  Exercise Capacity: Fair exercise capacity. BP Response: Normal blood pressure response. Clinical Symptoms: There is dyspnea. No CP. ECG Impression: No significant ST segment change suggestive of ischemia. Overall Impression: Abnormal stress nuclear study. Overall Impression Comments: On the vertical long axis, there are two images which have reduced uptake in the anterior wall at rest which is mildly reduced with stress representing possible small previous anterior infarct with mild peri-infarct ischemia. However the computer does not detect these defects and the wall motion is normal.   Appended Document: Cardiology Nuclear Study Left message to call. Gabriel Cirri RN BSN   Appended Document: Cardiology Nuclear Study pt aware. Gabriel Cirri RN BSN

## 2010-10-08 NOTE — Miscellaneous (Signed)
Summary: Orders Update pft charges  Clinical Lists Changes  Orders: Added new Service order of Carbon Monoxide diffusing w/capacity (94720) - Signed Added new Service order of Lung Volumes (94240) - Signed Added new Service order of Spirometry (Pre & Post) (94060) - Signed 

## 2010-10-08 NOTE — Miscellaneous (Signed)
Summary: Order Confirmation for CPAP/[facility]  Order Confirmation for CPAP/[facility]   Imported By: Phillis Knack 05/30/2010 11:25:09  _____________________________________________________________________  External Attachment:    Type:   Image     Comment:   External Document

## 2010-10-08 NOTE — Assessment & Plan Note (Signed)
Summary: COUGH/CLE   Vital Signs:  Patient profile:   65 year old male Weight:      230 pounds Temp:     97.6 degrees F oral Pulse rate:   76 / minute Pulse rhythm:   regular BP sitting:   100 / 60  (left arm) Cuff size:   large  Vitals Entered By: Maudie Mercury Dance CMA Deborra Medina) (August 06, 2010 12:24 PM) CC: Cough x3 weeks   History of Present Illness: CC: cough  3wk h/o coughing and HA.  + cough productive of yellow flegm and chest congestion.  worse at night, seems to get stuck in evenings.  prone to bronchitis in winter time.  Tried mucinex and taking some breathing treatment by pulm unsure what.  + chills.  + nausea.  + sore chest from cough.  No significant nasal congestion, PNDrainage.  No fevers, no abd pain, v/d, no rash, myalgia/arthralgia.  Wife sick recently with similar condition.  No smokers at home.  no h/o asthma/copd.  + h/o wheezing with bronchitis in past.  3 wks ago went to [place], prior had root canal and completed course of amoxicillin.  Current Medications (verified): 1)  Lutein 20 Mg Caps (Lutein) .Marland Kitchen.. 1 By Mouth Once Daily 2)  Levitra 20 Mg Tabs (Vardenafil Hcl) .... As Needed 3)  Aspirin 81 Mg  Tabs (Aspirin) .... Take 1 Tablet By Mouth Once A Day 4)  Crestor 40 Mg Tabs (Rosuvastatin Calcium) .Marland Kitchen.. 1 Tab By Mouth Once Daily 5)  Zetia 10 Mg Tabs (Ezetimibe) .... Take One Tablet By Mouth Daily. 6)  Omeprazole 20 Mg Cpdr (Omeprazole) .... Take 1 Tablet By Mouth Two Times A Day 7)  Metoprolol Tartrate 50 Mg Tabs (Metoprolol Tartrate) .... 1/2  Tab Two Times A Day By Mouth 8)  Allopurinol 100 Mg Tabs (Allopurinol) .... .one Tab By Mouth Two Times A Day 9)  Chlorthalidone 50 Mg Tabs (Chlorthalidone) .... 1/2 Tab By Mouth Daily 10)  Glucosamine 500 Mg Caps (Glucosamine Sulfate) .... 2 Tab Once Daily 11)  Fish Oil 1000 Mg Caps (Omega-3 Fatty Acids) .Marland Kitchen.. 1 Cap Two Times A Day 12)  Vitamin C 500 Mg  Tabs (Ascorbic Acid) .... Take 1 Tablet By Mouth Once A Day 13)   Vitamin D 1000 Unit  Tabs (Cholecalciferol) .... Take 1 Tablet By Mouth Once A Day 14)  Tylenol Extra Strength 500 Mg Tabs (Acetaminophen) .Marland Kitchen.. 1 Tab Two Times A Day 15)  Klor-Con M20 20 Meq Cr-Tabs (Potassium Chloride Crys Cr) .Marland Kitchen.. 1 Tab By Mouth Daily 16)  Cetirizine Hcl 10 Mg Tabs (Cetirizine Hcl) .Marland Kitchen.. 1 By Mouth Once Daily 17)  Cpap  11 [facility] 18)  Lisinopril 10 Mg Tabs (Lisinopril) .... Take One Tablet By Mouth Daily  Allergies: 1)  ! Percocet  Past History:  Past Medical History: Last updated: 05/14/2010 1.  CAD s/p CABG 1997 x 5.  Done in [place], [place].  Myoview in 2006 in ? [place] was normal per patient.  2.  Diverticulitis, hx of 3.  Gout 4.  Hyperlipidemia 5.  Hypertension 6.  osteoarthritis: knee OA -- injections in past / cortisone  7.  GERD  8.  atypical migraine - only gets aura  9.  hyperglycemia -- borderline DM  10.  CKD 11. Obstructive sleep apnea- NPSG 04/17/10- Moderate OSA AHI 36.3/hr,,,,CPAP 2. Cough- PFT 03/18/10- Mild obstructive airways dz w/o resp to BD. 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  ReMarried- 1st wife died Alcohol use-yes 1 drink per day or less  never smoked - occasional cigar Drug use-no Regular exercise-yes-ballroom dancing , ping pong , yoga , Charlean Sanfilippo   Review of Systems       per HPI  Physical Exam  General:  Well-developed,well-nourished,in no acute distress; alert,appropriate and cooperative throughout examination, nontoxic.  + coughing fits. Head:  normocephalic, atraumatic, and no abnormalities observed.  sinuses NT Eyes:  perrla, eomi, no injection Ears:  TMs clear Nose:  nares clear, no congestion Mouth:  MMM, no pharyngeal erythema/exudates Neck:  + R AC LAD Lungs:  + faint exp wheezing, slightly tight, no crackles/rales Heart:  Normal rate and regular rhythm. S1 and S2 normal without gallop, murmur, click, rub or other extra  sounds. Pulses:  2+ rad pulses, brisk cap refill Extremities:  no pedal edema Skin:  Intact without suspicious lesions or rashes   Impression & Recommendations:  Problem # 1:  COUGH (ICD-786.2) treat as bronchitis, going on for 3 wks, recent abx use, mild reactive airway component with wheezing, refilled albuterol.  red flags to return discussed.  Complete Medication List: 1)  Lutein 20 Mg Caps (Lutein) .Marland Kitchen.. 1 by mouth once daily 2)  Levitra 20 Mg Tabs (Vardenafil hcl) .... As needed 3)  Aspirin 81 Mg Tabs (Aspirin) .... Take 1 tablet by mouth once a day 4)  Crestor 40 Mg Tabs (Rosuvastatin calcium) .Marland Kitchen.. 1 tab by mouth once daily 5)  Zetia 10 Mg Tabs (Ezetimibe) .... Take one tablet by mouth daily. 6)  Omeprazole 20 Mg Cpdr (Omeprazole) .... Take 1 tablet by mouth two times a day 7)  Metoprolol Tartrate 50 Mg Tabs (Metoprolol tartrate) .... 1/2  tab two times a day by mouth 8)  Allopurinol 100 Mg Tabs (Allopurinol) .... .one tab by mouth two times a day 9)  Chlorthalidone 50 Mg Tabs (Chlorthalidone) .... 1/2 tab by mouth daily 10)  Glucosamine 500 Mg Caps (Glucosamine sulfate) .... 2 tab once daily 11)  Fish Oil 1000 Mg Caps (Omega-3 fatty acids) .Marland Kitchen.. 1 cap two times a day 12)  Vitamin C 500 Mg Tabs (Ascorbic acid) .... Take 1 tablet by mouth once a day 13)  Vitamin D 1000 Unit Tabs (Cholecalciferol) .... Take 1 tablet by mouth once a day 14)  Tylenol Extra Strength 500 Mg Tabs (Acetaminophen) .Marland Kitchen.. 1 tab two times a day 15)  Klor-con M20 20 Meq Cr-tabs (Potassium chloride crys cr) .Marland Kitchen.. 1 tab by mouth daily 16)  Cetirizine Hcl 10 Mg Tabs (Cetirizine hcl) .Marland Kitchen.. 1 by mouth once daily 17)  Cpap 11 [facility]  18)  Lisinopril 10 Mg Tabs (Lisinopril) .... Take one tablet by mouth daily 19)  Zithromax Z-pak 250 Mg Tabs (Azithromycin) .... Use as directed 20)  Ventolin Hfa 108 (90 Base) Mcg/act Aers (Albuterol sulfate) .... 2 puffs every 4-6 hours as needed sob  Patient Instructions: 1)  Sounds  like you have a bronchitis. 2)  Guaifenesin 400mg  IR 1 pill in am and 1 at noon with plenty of fluid to mobilize mucous, or mucinex. 3)  Use medication as prescribed: Zpack, albuterol inhaler to use pretty regularly for next few days. 4)  Please return if you are not improving as expected, or if you have high fevers (>101.5) or difficulty swallowing/breathing. 5)  Call clinic with questions.  Pleasure to see you today!  Prescriptions: VENTOLIN HFA 108 (90 BASE) MCG/ACT AERS (ALBUTEROL SULFATE) 2 puffs every 4-6 hours as needed  SOB  #1 x 3   Entered and Authorized by:   Ria Bush  MD   Signed by:   Ria Bush  MD on 08/06/2010   Method used:   Electronically to        [facility]. [phone]* (retail)       [address]       [place], [place]  [address]       Ph: [phone]       Fax: [phone]   RxID:   [id number] Azucena Fallen Z-PAK 250 MG TABS (AZITHROMYCIN) use as directed  #1 x 0   Entered and Authorized by:   Ria Bush  MD   Signed by:   Ria Bush  MD on 08/06/2010   Method used:   Electronically to        [facility]. [place] (retail)       [address]       [place], [place]  [address]       Ph: [phone]       Fax: [phone]   RxID:   [id number]    Orders Added: 1)  Est. Patient Level III [id number]    Current Allergies (reviewed today): ! PERCOCET

## 2010-10-08 NOTE — Assessment & Plan Note (Signed)
Summary: 1 MONTH/APC   Copy to:  [facility] Primary Provider/Referring Provider:  [name]  CC:  1 month follow up visit-sleep.Marland Kitchen  History of Present Illness: March 15, 2010- 64 yoM seen with his wife today on kind referral by [name] because of breathing concerns with sleep.  He and second wife are newly married, heading for honeymoon prolonged cruise. Never smoker told by his wife that he stops breathing and gasps in his sleep. Occasionaklly  aware that he is stuggling his way up out of sleep and flails to get a breath. Snores, but not loudly. Nocturia x 2. Feels unrested. Denies obvious nocturnal wheeze or reflux. NPSG at [facility] in [facility] around 2004 with dx of OSA. Tried CPAP a few months but airflow bothered him and first wife. Dyspneic if he bends over, but no hx of asthma or routine wheeze. On waking in the AM he has bothersome cough with scant white sputum. Takes him an hour or two for this to pass, then he is mostly clear through the day. Not much nasal congestion. Hasn't needed extra pillows or noted swelling ankles. Hx treated GERD, MI with CABG, HTN, tonsillectomy.  May 14, 2010- OSA.........................Marland Kitchenwife here No changes since last here. They did go on their honeymoon cruise without problesms. Tried sample Sprivia- didn't make a lot of difference but did dry him out. NPSG- 04/17/10- Moderate OSA, AHI 36.3/hr. CPAP 11. PFT- 03/18/10- FEV1 2.65/ 86%; R 0.77, mild obstructive disease w/o response to BD, NL LV; DLCO miildly reduced.  June 20, 2010- OSA................Marland Kitchenwife  Nurse CC: 1 month follow up visit-sleep. Has worked with Dr [name] on hx  coronary disease.  [facility] CPAP is on 11. He reports doing very well and sleeping through the night more with it. Wife notes little snore or apnea.    Preventive Screening-Counseling & Management  Alcohol-Tobacco     Smoking Status: never  Current Medications (verified): 1)  Lutein 20 Mg Caps (Lutein) .Marland Kitchen.. 1 By Mouth Once Daily 2)   Levitra 20 Mg Tabs (Vardenafil Hcl) .... As Needed 3)  Aspirin 81 Mg  Tabs (Aspirin) .... Take 1 Tablet By Mouth Once A Day 4)  Crestor 40 Mg Tabs (Rosuvastatin Calcium) .Marland Kitchen.. 1 Tab By Mouth Once Daily 5)  Zetia 10 Mg Tabs (Ezetimibe) .... Take One Tablet By Mouth Daily. 6)  Omeprazole 20 Mg Cpdr (Omeprazole) .... Take 1 Tablet By Mouth Two Times A Day 7)  Metoprolol Tartrate 50 Mg Tabs (Metoprolol Tartrate) .... 1/2  Tab Two Times A Day By Mouth 8)  Allopurinol 100 Mg Tabs (Allopurinol) .... .one Tab By Mouth Two Times A Day 9)  Chlorthalidone 50 Mg Tabs (Chlorthalidone) .... 1/2 Tab By Mouth Daily 10)  Glucosamine 500 Mg Caps (Glucosamine Sulfate) .... 2 Tab Once Daily 11)  Fish Oil 1000 Mg Caps (Omega-3 Fatty Acids) .Marland Kitchen.. 1 Cap Two Times A Day 12)  Vitamin C 500 Mg  Tabs (Ascorbic Acid) .... Take 1 Tablet By Mouth Once A Day 13)  Vitamin D 1000 Unit  Tabs (Cholecalciferol) .... Take 1 Tablet By Mouth Once A Day 14)  Tylenol Extra Strength 500 Mg Tabs (Acetaminophen) .Marland Kitchen.. 1 Tab Two Times A Day 15)  Klor-Con M20 20 Meq Cr-Tabs (Potassium Chloride Crys Cr) .Marland Kitchen.. 1 Tab By Mouth Daily 16)  Cetirizine Hcl 10 Mg Tabs (Cetirizine Hcl) .Marland Kitchen.. 1 By Mouth Once Daily 17)  Cpap .... At Bedtime 18)  Lisinopril 10 Mg Tabs (Lisinopril) .... Take One Tablet By Mouth  Daily  Allergies (verified): 1)  ! Percocet  Past History:  Past Medical History: Last updated: 05/14/2010 1.  CAD s/p CABG 1997 x 5.  Done in [place], [place].  Myoview in 2006 in ? [place] was normal per patient.  2.  Diverticulitis, hx of 3.  Gout 4.  Hyperlipidemia 5.  Hypertension 6.  osteoarthritis: knee OA -- injections in past / cortisone  7.  GERD  8.  atypical migraine - only gets aura  9.  hyperglycemia -- borderline DM  10.  CKD 11. Obstructive sleep apnea- NPSG 04/17/10- Moderate OSA AHI 36.3/hr,,,,CPAP 2. Cough- PFT 03/18/10- Mild obstructive airways dz w/o resp to BD. 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  ReMarried- 1st wife died Alcohol use-yes 1 drink per day or less  never smoked - occasional cigar Drug use-no Regular exercise-yes-ballroom dancing , ping pong , yoga , Richard Simmons   Risk Factors: Exercise: yes (04/12/2009)  Risk Factors: Smoking Status: never (06/20/2010)  Review of Systems      See HPI  The patient denies shortness of breath with activity, shortness of breath at rest, productive cough, non-productive cough, coughing up blood, chest pain, irregular heartbeats, acid heartburn, indigestion, loss of appetite, weight change, abdominal pain, difficulty swallowing, sore throat, tooth/dental problems, headaches, nasal congestion/difficulty breathing through nose, sneezing, itching, depression, and rash.    Vital Signs:  Patient profile:   65 year old male Height:      71 inches Weight:      225.50 pounds BMI:     31.56 O2 Sat:      98 % on Room air Pulse rate:   55 / minute BP sitting:   118 / 78  (left arm) Cuff size:   regular  Vitals Entered By: Clayborne Dana CMA (June 20, 2010 11:09 AM)  O2 Flow:  Room air CC: 1 month follow up visit-sleep.   Physical Exam  Additional Exam:  General: A/Ox3; pleasant and cooperative, NAD, SKIN: no rash, lesions NODES: no lymphadenopathy HEENT: [place]/AT, EOM- WNL, Conjuctivae- clear, PERRLA, TM-WNL, Nose- clear, Throat- clear and wnl, Mallampati  II, no stridor NECK: Supple w/ fair ROM, JVD- none, normal carotid impulses w/o bruits Thyroid- normal to palpation CHEST: Clear to P&A,  HEART: RRR, no  m/g/r heard ABDOMEN:, mild overweight [id number], nl pulses, no edema, cyanosis or clubbing NEURO: Grossly intact to observation      Impression & Recommendations:  Problem # 1:  OBSTRUCTIVE SLEEP APNEA (ICD-327.23)  Very good initial start on CPAP with good reported comfort and control. We will leave fixed pressure set at 11/ [facility]. We have discussed CPAP comfort and compliance measures.  Medications Added to Medication List This Visit: 1)  Cpap 11 [facility]   Other Orders: Est. Patient Level III [id number])  Patient Instructions: 1)  Please schedule a follow-up appointment in 3 months. 2)  Good start on CPAP. Call sooner if there are concerns.

## 2010-10-08 NOTE — Assessment & Plan Note (Signed)
Summary: ROV 2 MONTHS///KP   Copy to:  [facility] Primary Provider/Referring Provider:  [name]  CC:  2 month follow up visit-review sleep study.  History of Present Illness: History of Present Illness: March 15, 2010- 65 yoM seen with his wife today on kind referral by [name] because of breathing concerns with sleep.  He and second wife are newly married, heading for honeymoon prolonged cruise. Never smoker told by his wife that he stops breathing and gasps in his sleep. Occasionaklly  aware that he is stuggling his way up out of sleep and flails to get a breath. Snores, but not loudly. Nocturia x 2. Feels unrested. Denies obvious nocturnal wheeze or reflux. NPSG at [facility] in [facility] around 2004 with dx of OSA. Tried CPAP a few months but airflow bothered him and first wife. Dyspneic if he bends over, but no hx of asthma or routine wheeze. On waking in the AM he has bothersome cough with scant white sputum. Takes him an hour or two for this to pass, then he is mostly clear through the day. Not much nasal congestion. Hasn't needed extra pillows or noted swelling ankles. Hx treated GERD, MI with CABG, HTN, tonsillectomy.  May 14, 2010- OSA.........................Marland Kitchenwife here No changes since last here. They did go on their honeymoon cruise without problesms. Tried sample Sprivia- didn't make a lot of difference but did dry him out. NPSG- 04/17/10- Moderate OSA, AHI 36.3/hr. CPAP 11. PFT- 03/18/10- FEV1 2.65/ 86%; R 0.77, mild obstructive disease w/o response to BD, NL LV; DLCO miildly reduced.    Preventive Screening-Counseling & Management  Alcohol-Tobacco     Smoking Status: never  Allergies: 1)  ! 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  ReMarried- 1st wife died Alcohol use-yes 1 drink per day or less  never smoked - occasional cigar Drug use-no Regular exercise-yes-ballroom dancing , ping pong , yoga , Richard Simmons   Risk Factors: Exercise: yes (04/12/2009)  Risk Factors: Smoking Status: never (05/14/2010)  Past Medical History: 1.  CAD s/p CABG 1997 x 5.  Done in [place], [place].  Myoview in 2006 in ? [place] was normal per patient.  2.  Diverticulitis, hx of 3.  Gout 4.  Hyperlipidemia 5.  Hypertension 6.  osteoarthritis: knee OA -- injections in past / cortisone  7.  GERD  8.  atypical migraine - only gets aura  9.  hyperglycemia -- borderline DM  10.  CKD 11. Obstructive sleep apnea- NPSG 04/17/10- Moderate OSA AHI 36.3/hr,,,,CPAP 2. Cough- PFT 03/18/10- Mild obstructive airways dz w/o resp to BD. FEV1 2.65/ 86%; Ratio 0.77  Review of Systems      See HPI       The patient complains of non-productive cough.  The patient denies shortness of breath with activity, shortness of breath at rest, productive cough, coughing up blood, chest pain, irregular heartbeats, acid heartburn, indigestion, loss of appetite, weight change, abdominal pain, difficulty swallowing, sore throat, tooth/dental problems, nasal congestion/difficulty breathing through nose, and sneezing.    Vital Signs:  Patient profile:   64 year  old male Height:      71 inches Weight:      223.50 pounds BMI:     31.28 O2 Sat:      98 % on Room air Pulse rate:   59 / minute BP sitting:   122 / 80  (left arm) Cuff size:   regular  Vitals Entered By: Clayborne Dana CMA (May 14, 2010 9:40 AM)  O2 Flow:  Room air CC: 2 month follow up visit-review sleep study   Physical Exam  Additional Exam:  General: A/Ox3; pleasant and cooperative, NAD, SKIN: no rash,  lesions NODES: no lymphadenopathy HEENT: [place]/AT, EOM- WNL, Conjuctivae- clear, PERRLA, TM-WNL, Nose- clear, Throat- clear and wnl, Mallampati  II, no stridor NECK: Supple w/ fair ROM, JVD- none, normal carotid impulses w/o bruits Thyroid- normal to palpation CHEST: Clear to P&A, no rales or cough HEART: RRR, no m/g/r heard ABDOMEN:, mild overweight [id number], nl pulses, no edema, cyanosis or clubbing NEURO: Grossly intact to observation      Impression & Recommendations:  Problem # 1:  OBSTRUCTIVE SLEEP APNEA (ICD-327.23) Moderat OSA. he didn't tolerate initial exposure to CPAP diuring his previous marriage. He is a good candidate to try CPAP again based on this sleep study. We discussed alternatives carefully.  Problem # 2:  COUGH (ICD-786.2)  PFT indicates a mild small airway obstruction. some of his cough may be from GERD, but we will let him try a bronchodilator  Other Orders: Est. Patient Level IV [id number]) DME Referral (DME) Flu Vaccine 63yrs + MEDICARE PATIENTS [id number]) Administration Flu vaccine - MCR [id number])  Patient Instructions: 1)  Please schedule a follow-up appointment in 1 month. 2)  See [facility] to set up CPAP trial. 3)  Sample rescue bronchodilator: 2 puffs up to 4 times daily if needed for cough, chest tightness, shortness of breath.     Flu Vaccine Consent Questions     Do you have a history of severe allergic reactions to this vaccine? no    Any prior history of allergic reactions to egg and/or gelatin? no    Do you have a sensitivity to the preservative Thimersol? no    Do you have a past history of Guillan-Barre Syndrome? no    Do you currently have an acute febrile illness? no    Have you ever had a severe reaction to latex? no    Vaccine information given and explained to patient? yes    Are you currently pregnant? no    Lot Number:[id number]   Exp Date:03/08/2011   Site Given  Left Deltoid IMedflu   Clayborne Dana CMA  May 14, 2010 11:31 AM

## 2010-10-08 NOTE — Progress Notes (Signed)
Summary: Chest congestion  Phone Note Call from Patient Call back at Home Phone [phone]   [name]: Patient Call For: Allena Earing MD Summary of Call: Patient complains of chest congestion, green and yellow mucous, coughing, tightness in chest, head cold, no fever, has been taking Mucinex, Zicam, and using  Vicks vapor rub.  Thinks he needs an antibiotic and I advised that we usually don't prescribe antibiotics unless the patient is seen.  He agrees to be seen if necessary.  Some relief but not much.  Please advise.   Initial call taken by: Sherrian Divers CMA Deborra Medina),  October 25, 2009 8:20 AM  Follow-up for Phone Call        It sounds as if he is doing good URI care. 65 yo without lung disease with classic URI symptoms - sounds like classic viral infection.  Discuss colds, expect symptoms 7-10 weeks and continue supportive care. If fever develops, no improvement during this time period, then f/u indicated Follow-up by: Owens Loffler MD,  October 25, 2009 10:54 AM  Additional Follow-up for Phone Call Additional follow up Details #1::        Advised pt. Additional Follow-up by: Marty Heck CMA,  October 25, 2009 12:05 PM

## 2010-10-08 NOTE — Assessment & Plan Note (Signed)
Summary: EC6/AMD   Visit Type:  EC6 Referring Provider:  [facility] Primary Provider:  [name]  CC:  no complaints and other than edema when travel.  History of Present Illness: 65 yo with history of CAD s/p CABG, HTN, hyperlipidemia presents to establish cardiology care.  He had CABG back in 1997 in [place], [place].  Since then, he had a myoview in 2006 that per his report showed no abnormalities.  He was been followed by primary care at the [facility] and recently established care with Dr. Glori Bickers at [facility].  He has been doing well from a cardiac standpoint.     Since his last visit to the clinic in 2010, [name] has continued to do well. He does have some lower extremity edema. He does exercise on a regular basis, doing yoga, water aerobics and walking. He has lost 20 pounds since his last clinic visit by doing Weight Watchers.  He states that he has not had a stress test in many years.      Current Problems (verified): 1)  Uri  (ICD-465.9) 2)  Hyperglycemia  (ICD-790.29) 3)  Renal Insufficiency  (ICD-588.9) 4)  Gerd  (ICD-530.81) 5)  Cad  (ICD-414.00) 6)  Hypertension  (ICD-401.9) 7)  Hyperlipidemia  (ICD-272.4) 8)  Gout  (ICD-274.9) 9)  Diverticulitis, Hx of  (ICD-V12.79)  Current Medications (verified): 1)  Lutein 20 Mg Caps (Lutein) .Marland Kitchen.. 1 By Mouth Once Daily 2)  Levitra 20 Mg Tabs (Vardenafil Hcl) .... As Needed 3)  Aspirin 81 Mg  Tabs (Aspirin) .... Take 1 Tablet By Mouth Once A Day 4)  Crestor 40 Mg Tabs (Rosuvastatin Calcium) .Marland Kitchen.. 1 Tab By Mouth Once Daily 5)  Niaspan 500 Mg Cr-Tabs (Niacin (Antihyperlipidemic)) .... Take 1 Tablet By Mouth Once A Day 6)  Omeprazole 20 Mg Cpdr (Omeprazole) .... Take 1 Tablet By Mouth Two Times A Day 7)  Metoprolol Tartrate 50 Mg Tabs (Metoprolol Tartrate) .... 1/2  Tab Two Times A Day By Mouth 8)  Allopurinol 100 Mg Tabs (Allopurinol) .... .one Tab By Mouth Two Times A Day 9)  Chlorthalidone 50 Mg Tabs (Chlorthalidone) .... 1/2 Tab By  Mouth Daily 10)  Glucosamine 500 Mg Caps (Glucosamine Sulfate) .... 2 Tab Once Daily 11)  Fish Oil 1000 Mg Caps (Omega-3 Fatty Acids) .Marland Kitchen.. 1 Cap Two Times A Day 12)  Vitamin C 500 Mg  Tabs (Ascorbic Acid) .... Take 1 Tablet By Mouth Once A Day 13)  Vitamin D 1000 Unit  Tabs (Cholecalciferol) .... Take 1 Tablet By Mouth Once A Day 14)  Tylenol Extra Strength 500 Mg Tabs (Acetaminophen) .Marland Kitchen.. 1 Tab Two Times A Day 15)  Klor-Con M20 20 Meq Cr-Tabs (Potassium Chloride Crys Cr) .Marland Kitchen.. 1 Tab By Mouth Daily 16)  Cetirizine Hcl 10 Mg Tabs (Cetirizine Hcl) .Marland Kitchen.. 1 By Mouth Once Daily 17)  Ra Col-Rite 100 Mg Caps (Docusate Sodium) .Marland Kitchen.. 1 By Mouth Once Daily  Allergies (verified): 1)  ! Percocet  Past History:  Past Medical History: Last updated: 04/17/2009 1.  CAD s/p CABG 1997 x 5.  Done in [place], [place].  Myoview in 2006 in ? [place] was normal per patient.  2.  Diverticulitis, hx of 3.  Gout 4.  Hyperlipidemia 5.  Hypertension 6.  osteoarthritis: knee OA -- injections in past / cortisone  7.  GERD  8.  atypical migraine - only gets aura  9.  hyperglycemia -- borderline DM  10.  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  The patient denies fever, vision loss, decreased hearing, hoarseness, chest pain, syncope, dyspnea on exertion, prolonged cough,  abdominal pain, incontinence, muscle weakness, depression, and enlarged lymph nodes.    Vital Signs:  Patient profile:   65 year old male Height:      71 inches Weight:      217.50 pounds BMI:     30.44 Pulse rate:   76 / minute Pulse rhythm:   regular BP sitting:   126 / 86  (left arm) Cuff size:   large  Vitals Entered By: Philemon Kingdom (November 29, 2009 3:55 PM)  Physical Exam  General:  middle-aged gentleman in no apparent distress, alert and oriented x3, HEENT exam is benign, oropharynx is clear, neck is supple with no JVP or carotid bruits the heart sounds are regular with S1-S2 no murmurs appreciated, lungs are clear to auscultation with no wheezes or rales. He has a nontender abdomen that is essentially benign no widened aortic pulsation. trace to 1+ lower extremity edema bilaterally, neurologic exam grossly nonfocal, skin is warm and dry, pulses are equal and symmetrical in his upper and lower extremities.   Impression & Recommendations:  Problem # 1:  CAD (ICD-414.00)  Patient had CABG in 1997.  Negative myoview (per his report) in 2006.  as it has been many years since his last stress test, we'll set him up for a exercise perfusion imaging study. He is a nonsmoker and currently takes aspirin. Will increase the aspirin to 2 baby aspirins a day. He takes metoprolol. He is currently not on an ACE inhibitor and this could be added if there is room on his blood pressure. We'll discuss this with him on his next visit.   Orders: Nuclear Stress Test (Nuc Stress Test)  Problem # 2:  HYPERLIPIDEMIA ([id number]) Latest cholesterol shows that he is not at goal. Total cholesterol should be less than 150 and he is 169, LDL is 90 and ideally he should be less than 70. This was prior to recent weight loss and an increase in his Crestor from 20-40 mg. We'll recheck his cholesterol and make further decisions based on this. If he does not have an LDL less or close to 70, which as that he  attend milligrams daily.  His updated medication list for this problem includes:    Crestor 40 Mg Tabs (Rosuvastatin calcium) .Marland Kitchen... 1 tab by mouth once daily    Niaspan 500 Mg Cr-tabs (Niacin (antihyperlipidemic)) .Marland Kitchen... Take 1 tablet by mouth once a day  Patient Instructions: 1)  Your physician recommends that you schedule a follow-up appointment in: 6 months 2)  Your physician recommends that you continue on your current medications as directed. Please refer to the Current Medication list given to you today. 3)  Your physician has requested that you have an exercise stress  myoview.  For further information please visit [email or web address].  Please follow instruction sheet, as given. 4)  Your physician recommends that you return for a FASTING lipid profile: at your earliest convenience (lipids, cmet)

## 2010-10-10 NOTE — Miscellaneous (Signed)
Summary: Health Care Power of [place] [facility] [place] [facility]   Imported By: Laural Benes 09/10/2010 15:19:06  _____________________________________________________________________  External Attachment:    Type:   Image     Comment:   External Document

## 2010-10-10 NOTE — Assessment & Plan Note (Signed)
Summary: 6 MONTH FOLLOW UP/[facility]   Vital Signs:  Patient profile:   65 year old male Height:      71 inches Weight:      230.75 pounds BMI:     32.30 Temp:     97.9 degrees F oral Pulse rate:   64 / minute Pulse rhythm:   regular BP sitting:   100 / 60  (left arm) Cuff size:   large  Vitals Entered By: Ozzie Hoyle LPN (January  4, [id number] 9:36 AM) CC: six month f/u   History of Present Illness: here for f/u of HTN and Lipids and hyperglycemia and renal insuff  is feeling pretty good  nothing new medically  had bronchitis - finally cleared up from prednisone  wt is up 1lb with bmi of 32 he gained more and now working on loosing it  getting ready to start walking (now doing yoga)   HTN in very good control 100/60-- lisinopril was added   hyperglycemia slt worse with AIc 6.4 from 6.2 diet was generally worse and was on prednisone  will start walking and water aerobics  will start weight watchers -- soon   is excited -- waiting for new grandchild   renal insuff slt worse at 1.4 cr today and slt low gfr is not drinking enough water and eating too much salt    lipids are imp with addn of zetia trig 170, HDL 45 and LDL 72   occ ankles feel cold - but not swollen     Allergies: 1)  ! Percocet  Past History:  Past Medical History: Last updated: 05/14/2010 1.  CAD s/p CABG 1997 x 5.  Done in [place], [place].  Myoview in 2006 in ? [place] was normal per patient.  2.  Diverticulitis, hx of 3.  Gout 4.  Hyperlipidemia 5.  Hypertension 6.  osteoarthritis: knee OA -- injections in past / cortisone  7.  GERD  8.  atypical migraine - only gets aura  9.  hyperglycemia -- borderline DM  10.  CKD 11. Obstructive sleep apnea- NPSG 04/17/10- Moderate OSA AHI 36.3/hr,,,,CPAP 2. Cough- PFT 03/18/10- Mild obstructive airways dz w/o resp to BD. 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 Eyes:  Denies eye irritation. CV:  Denies chest pain or discomfort, lightheadness, and palpitations. Resp:  Denies cough, shortness of breath, and wheezing. GI:  Denies abdominal pain, change in bowel habits, indigestion, loss of appetite, and nausea. GU:  Denies dysuria, nocturia, and urinary hesitancy. MS:  Denies joint redness, muscle aches, and cramps. Derm:  Denies itching, lesion(s), poor wound healing, and rash. Neuro:  Denies numbness and tingling. Endo:  Denies cold intolerance, excessive thirst, excessive urination, and heat intolerance. Heme:  Denies abnormal  bruising and bleeding.  Physical Exam  General:  overweight but generally well appearing  Head:  normocephalic, atraumatic, and no abnormalities observed.   Eyes:  vision grossly intact, pupils equal, pupils round, and pupils reactive to light.  no conjunctival pallor, injection or icterus  Mouth:  pharynx pink and moist.   Neck:  supple with full rom and no masses or thyromegally, no JVD or carotid bruit  Chest Wall:  No deformities, masses, tenderness or gynecomastia noted. Lungs:  Normal respiratory effort, chest expands symmetrically. Lungs are clear to auscultation, no crackles or wheezes. Heart:  Normal rate and regular rhythm. S1  and S2 normal without gallop, murmur, click, rub or other extra sounds. Abdomen:  Bowel sounds positive,abdomen soft and non-tender without masses, organomegaly or hernias noted. no renal bruits  Msk:  No deformity or scoliosis noted of thoracic or lumbar spine.   Pulses:  R and L carotid,radial,femoral,dorsalis pedis and posterior tibial pulses are full and equal bilaterally Extremities:  slight/ trace ankle swelling bilat Neurologic:  sensation intact to light touch, gait normal, and DTRs symmetrical and normal.   Skin:  Intact without suspicious lesions or rashes Cervical Nodes:  No lymphadenopathy noted Inguinal Nodes:  No significant adenopathy Psych:  normal affect, talkative and pleasant   Diabetes Management Exam:    Foot Exam (with socks and/or shoes not present):       Sensory-Pinprick/Light touch:          Left medial foot (L-4): normal          Left dorsal foot (L-5): normal          Left lateral foot (S-1): normal          Right medial foot (L-4): normal          Right dorsal foot (L-5): normal          Right lateral foot (S-1): normal       Sensory-Monofilament:          Left foot: normal          Right foot: normal       Inspection:          Left foot: normal          Right foot: normal       Nails:          Left foot: normal          Right foot: normal   Impression & Recommendations:  Problem # 1:  HYPERGLYCEMIA (ICD-790.29) Assessment Deteriorated  this is slt worse after bronchitis and prednisone and poor holiday eating/ wt gain disc healthy diet (low simple sugar/ choose complex carbs/ low sat fat) diet and exercise in detail  will try harder   Labs Reviewed: Creat: 1.4 (09/04/2010)     Problem # 2:  RENAL INSUFFICIENCY (ICD-588.9) Assessment: Deteriorated this is worse suspect less fluids and addn of lisinopril may have inc cr is at risk of renal art stenosis re check renal prof 1 mo after inc water intake and also urine tests and then f/u if not  imp consider renal US and ref to renal  Problem # 3:  HYPERTENSION (ICD-401.9) Assessment: Improved  this is improved with lisinopril  will get back to exercise His updated medication list for this problem includes:    Metoprolol Tartrate 50 Mg Tabs (Metoprolol tartrate) .Marland Kitchen... 1/2  tab two times a day by mouth    Chlorthalidone 50 Mg Tabs (Chlorthalidone) .Marland Kitchen... 1/2 tab by mouth  daily    Lisinopril 10 Mg Tabs (Lisinopril) .Marland Kitchen... Take one tablet by mouth daily  BP today: 100/60 Prior BP: 122/70 (08/12/2010)  Labs Reviewed: K+: 4.6 (09/04/2010) Creat: : 1.4 (09/04/2010)   Chol: 151 (09/04/2010)   HDL: 45.30 (09/04/2010)   LDL: 72 (09/04/2010)   TG: 170.0 (09/04/2010)  Problem # 4:  HYPERLIPIDEMIA (ICD-272.4) Assessment: Improved  better with addn of zetia  very close to goal disc low sat fat diet  His updated medication list for this problem includes:    Crestor 40 Mg Tabs (Rosuvastatin calcium) .Marland Kitchen... 1 tab by mouth once daily    Zetia 10 Mg Tabs (Ezetimibe) .Marland Kitchen... Take one tablet by mouth daily.  Labs Reviewed: SGOT: 26 (09/04/2010)   SGPT: 27 (09/04/2010)   HDL:45.30 (09/04/2010), 42.20 (03/06/2010)  LDL:72 (09/04/2010), 91 (07/19/2009)  Chol:151 (09/04/2010), 193 (03/06/2010)  Trig:170.0 (09/04/2010), 249.0 (03/06/2010)  Complete Medication List: 1)  Lutein 20 Mg Caps (Lutein) .Marland Kitchen.. 1 by mouth once daily 2)  Levitra 20 Mg Tabs (Vardenafil hcl) .... As needed 3)  Aspirin 81 Mg Tabs (Aspirin) .... Take 2  tablets  by mouth once a day 4)  Crestor 40 Mg Tabs (Rosuvastatin calcium) .Marland Kitchen.. 1 tab by mouth once daily 5)  Zetia 10 Mg Tabs (Ezetimibe) .... Take one tablet by mouth daily. 6)  Omeprazole 20 Mg Cpdr (Omeprazole) .... Take 1 tablet by mouth two times a day 7)  Metoprolol Tartrate 50 Mg Tabs (Metoprolol tartrate) .... 1/2  tab two times a day by mouth 8)  Allopurinol 100 Mg Tabs (Allopurinol) .... .one tab by mouth two times a day 9)  Chlorthalidone 50 Mg Tabs (Chlorthalidone)  .... 1/2 tab by mouth daily 10)  Glucosamine 500 Mg Caps (Glucosamine sulfate) .... 2 tab once daily 11)  Fish Oil 1000 Mg Caps (Omega-3 fatty acids) .Marland Kitchen.. 1 cap two times a day 12)  Vitamin C 500 Mg Tabs (Ascorbic acid) .... Take 1 tablet by mouth once a day 13)  Vitamin D 1000 Unit Tabs (Cholecalciferol) .... Take 1 tablet by mouth once a day 14)  Tylenol Extra Strength 500 Mg Tabs (Acetaminophen) .Marland Kitchen.. 1 tab two times a day 15)  Klor-con M20 20 Meq Cr-tabs (Potassium chloride crys cr) .Marland Kitchen.. 1 tab by mouth daily 16)  Cetirizine Hcl 10 Mg Tabs (Cetirizine hcl) .Marland Kitchen.. 1 by mouth once daily 17)  Cpap 11 [facility]  18)  Lisinopril 10 Mg Tabs (Lisinopril) .... Take one tablet by mouth daily 19)  Ventolin Hfa 108 (90 Base) Mcg/act Aers (Albuterol sulfate) .... 2 puffs every 4-6 hours as needed sob  Patient Instructions: 1)  drink a lot of water  2)  use your support stockings  3)  no change in medicines  4)  in 1 month plan non fasting labs for renal profile/ microalbumin and ua dip and spin  5)  then follow up 1 week later    Orders Added: 1)  Est. Patient Level IV [id number]    Current Allergies (reviewed today): ! PERCOCET

## 2010-10-10 NOTE — Assessment & Plan Note (Signed)
Summary: rov 3 months///kp   Copy to:  [facility] Primary Provider/Referring Provider:  [name]  CC:  3 month follow up visit-sleep apnea; taking CPAP in middle of night-able to put back on and return to sleep.Marland Kitchen  History of Present Illness: May 14, 2010- OSA.........................Marland Kitchenwife here No changes since last here. They did go on their honeymoon cruise without problesms. Tried sample Sprivia- didn't make a lot of difference but did dry him out. NPSG- 04/17/10- Moderate OSA, AHI 36.3/hr. CPAP 11. PFT- 03/18/10- FEV1 2.65/ 86%; R 0.77, mild obstructive disease w/o response to BD, NL LV; DLCO miildly reduced.  June 20, 2010- OSA................Marland Kitchenwife  Nurse CC: 1 month follow up visit-sleep. Has worked with Dr [name] on hx  coronary disease.  [facility] CPAP is on 11. He reports doing very well and sleeping through the night more with it. Wife notes little snore or apnea.   October 01, 2010-  OSA................Marland Kitchenwife here Nurse-CC: 3 month follow up visit-sleep apnea; taking CPAP in middle of night-able to put back on and return to sleep. Continues with CPAP11. Full face mask is good, but very occasionally he takes it off in sleep. Wife confirms it stops his snore. He is less likely to fight sleepiness during day now that he is using CPAP.Marland Kitchen No major complaints or concerns.   Preventive Screening-Counseling & Management  Alcohol-Tobacco     Smoking Status: never  Current Medications (verified): 1)  Lutein 20 Mg Caps (Lutein) .Marland Kitchen.. 1 By Mouth Once Daily 2)  Levitra 20 Mg Tabs (Vardenafil Hcl) .... As Needed 3)  Aspirin 81 Mg  Tabs (Aspirin) .... Take 2  Tablets  By Mouth Once A Day 4)  Crestor 40 Mg Tabs (Rosuvastatin Calcium) .Marland Kitchen.. 1 Tab By Mouth Once Daily 5)  Zetia 10 Mg Tabs (Ezetimibe) .... Take One Tablet By Mouth Daily. 6)  Omeprazole 20 Mg Cpdr (Omeprazole) .... Take 1 Tablet By Mouth Two Times A Day 7)  Metoprolol Tartrate 50 Mg Tabs (Metoprolol Tartrate) .... 1/2  Tab Two  Times A Day By Mouth 8)  Allopurinol 100 Mg Tabs (Allopurinol) .... .one Tab By Mouth Two Times A Day 9)  Chlorthalidone 50 Mg Tabs (Chlorthalidone) .... 1/2 Tab By Mouth Daily 10)  Glucosamine 500 Mg Caps (Glucosamine Sulfate) .... 2 Tab Once Daily 11)  Fish Oil 1000 Mg Caps (Omega-3 Fatty Acids) .Marland Kitchen.. 1 Cap Two Times A Day 12)  Vitamin C 1000 Mg Tabs (Ascorbic Acid) .... Take 1 By Mouth Once Daily 13)  Vitamin D 1000 Unit  Tabs (Cholecalciferol) .... Take 1 Tablet By Mouth Once A Day 14)  Tylenol Extra Strength 500 Mg Tabs (Acetaminophen) .Marland Kitchen.. 1 Tab Two Times A Day 15)  Klor-Con M20 20 Meq Cr-Tabs (Potassium Chloride Crys Cr) .Marland Kitchen.. 1 Tab By Mouth Daily 16)  Cetirizine Hcl 10 Mg Tabs (Cetirizine Hcl) .Marland Kitchen.. 1 By Mouth Once Daily 17)  Cpap  11 [facility] 18)  Lisinopril 10 Mg Tabs (Lisinopril) .... Take One Tablet By Mouth Daily 19)  Ventolin Hfa 108 (90 Base) Mcg/act Aers (Albuterol Sulfate) .... 2 Puffs Every 4-6 Hours As Needed Sob  Allergies (verified): 1)  ! Percocet  Past History:  Past Medical History: Last updated: 05/14/2010 1.  CAD s/p CABG 1997 x 5.  Done in [place] [facility] [facility], [place].  Myoview in 2006 in ? [place] was normal per patient.  2.  Diverticulitis, hx of 3.  Gout 4.  Hyperlipidemia 5.  Hypertension 6.  osteoarthritis: knee OA -- injections in past /  cortisone  7.  GERD  8.  atypical migraine - only gets aura  9.  hyperglycemia -- borderline DM  10.  CKD 11. Obstructive sleep apnea- NPSG 04/17/10- Moderate OSA AHI 36.3/hr,,,,CPAP 2. Cough- PFT 03/18/10- Mild obstructive airways dz w/o resp to BD. 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 Alcohol use-yes 1 drink per day or less  never smoked - occasional cigar Drug use-no Regular exercise-yes-ballroom dancing , ping pong , yoga , Richard Simmons   Risk Factors: Exercise: yes (04/12/2009)  Risk Factors: Smoking Status: never (10/01/2010)  Review of Systems      See HPI  The patient denies shortness of breath with activity, shortness of breath at rest, productive cough, non-productive cough, coughing up blood, chest pain, irregular heartbeats, acid heartburn, indigestion, loss of appetite, weight change, abdominal pain, difficulty swallowing, sore throat, tooth/dental problems, headaches, nasal congestion/difficulty breathing through nose, and sneezing.    Vital Signs:  Patient profile:   65 year old male Height:      71 inches Weight:      235.38 pounds BMI:     32.95 O2 Sat:      97 % on Room air Pulse rate:   70 / minute BP sitting:   122 / 82  (left arm) Cuff size:   regular  Vitals Entered By: Clayborne Dana CMA (October 01, 2010 2:38 PM)  O2 Flow:  Room air CC: 3 month follow up visit-sleep apnea; taking CPAP in middle of night-able to put back on and return to sleep.   Physical Exam  Additional Exam:  General: A/Ox3; pleasant and cooperative, NAD, SKIN: no rash, lesions NODES: no lymphadenopathy HEENT: [place]/AT, EOM- WNL, Conjuctivae- clear, PERRLA, TM-WNL, Nose- clear, Throat- clear and wnl, Mallampati  II, no stridor NECK: Supple w/ fair ROM, JVD- none, normal carotid impulses w/o bruits Thyroid- normal to palpation, no stridor CHEST: Clear to P&A,  HEART: RRR, no m/g/r heard ABDOMEN:, mild overweight [id number], nl pulses, no edema, cyanosis or clubbing NEURO: Grossly intact to observation      Impression & Recommendations:  Problem # 1:  OBSTRUCTIVE SLEEP APNEA (ICD-327.23)  Good compliance and control  Other than once or twice a  month taking ithe mask off in his sleep, I don't hear of any problems at all. Sleep hygiene and comfort issues reviewed.   Medications Added to Medication List This Visit: 1)  Vitamin C 1000 Mg Tabs (Ascorbic acid) .... Take 1 by mouth once daily  Other Orders: Est. Patient Level III [id number])  Patient Instructions: 1)  Please schedule a follow-up appointment in 1 year. Please call if needed 2)  Continue CPAP at 11.

## 2010-11-05 ENCOUNTER — Encounter: Payer: Self-pay | Admitting: Family Medicine

## 2010-11-11 ENCOUNTER — Other Ambulatory Visit: Payer: Self-pay

## 2010-11-14 ENCOUNTER — Ambulatory Visit: Payer: Self-pay | Admitting: Family Medicine

## 2010-11-25 ENCOUNTER — Other Ambulatory Visit: Payer: Self-pay | Admitting: Family Medicine

## 2010-11-25 ENCOUNTER — Other Ambulatory Visit (INDEPENDENT_AMBULATORY_CARE_PROVIDER_SITE_OTHER): Payer: Medicare PPO

## 2010-11-25 ENCOUNTER — Encounter: Payer: Self-pay | Admitting: *Deleted

## 2010-11-25 DIAGNOSIS — I10 Essential (primary) hypertension: Secondary | ICD-10-CM

## 2010-11-25 DIAGNOSIS — R7309 Other abnormal glucose: Secondary | ICD-10-CM

## 2010-11-25 DIAGNOSIS — E785 Hyperlipidemia, unspecified: Secondary | ICD-10-CM

## 2010-11-25 DIAGNOSIS — N259 Disorder resulting from impaired renal tubular function, unspecified: Secondary | ICD-10-CM

## 2010-11-25 LAB — RENAL FUNCTION PANEL
Albumin: 5.1 g/dL (ref 3.5–5.2)
BUN: 30 mg/dL — ABNORMAL HIGH (ref 6–23)
CO2: 29 mEq/L (ref 19–32)
Chloride: 99 mEq/L (ref 96–112)
Phosphorus: 3.5 mg/dL (ref 2.3–4.6)
Potassium: 4.5 mEq/L (ref 3.5–5.1)

## 2010-11-25 LAB — MICROALBUMIN / CREATININE URINE RATIO
Creatinine,U: 64.1 mg/dL
Microalb Creat Ratio: 3.3 mg/g (ref 0.0–30.0)

## 2010-11-25 LAB — URINALYSIS, ROUTINE W REFLEX MICROSCOPIC
Hgb urine dipstick: NEGATIVE
Ketones, ur: NEGATIVE
Leukocytes, UA: NEGATIVE
Urine Glucose: NEGATIVE
Urobilinogen, UA: 0.2 (ref 0.0–1.0)

## 2010-11-25 LAB — HEPATIC FUNCTION PANEL
ALT: 23 U/L (ref 0–53)
Albumin: 5.1 g/dL (ref 3.5–5.2)
Alkaline Phosphatase: 53 U/L (ref 39–117)
Bilirubin, Direct: 0.2 mg/dL (ref 0.0–0.3)
Total Protein: 7.3 g/dL (ref 6.0–8.3)

## 2010-11-25 LAB — LIPID PANEL
Cholesterol: 127 mg/dL (ref 0–200)
HDL: 37.7 mg/dL — ABNORMAL LOW (ref >39.00)
VLDL: 48.2 mg/dL — ABNORMAL HIGH (ref 0.0–40.0)

## 2010-11-26 ENCOUNTER — Ambulatory Visit (INDEPENDENT_AMBULATORY_CARE_PROVIDER_SITE_OTHER): Payer: Medicare PPO | Admitting: Cardiovascular Disease

## 2010-11-26 ENCOUNTER — Encounter: Payer: Self-pay | Admitting: Cardiovascular Disease

## 2010-11-26 DIAGNOSIS — E785 Hyperlipidemia, unspecified: Secondary | ICD-10-CM

## 2010-11-26 DIAGNOSIS — I2510 Atherosclerotic heart disease of native coronary artery without angina pectoris: Secondary | ICD-10-CM

## 2010-11-26 DIAGNOSIS — I10 Essential (primary) hypertension: Secondary | ICD-10-CM

## 2010-11-26 DIAGNOSIS — G4733 Obstructive sleep apnea (adult) (pediatric): Secondary | ICD-10-CM

## 2010-11-26 NOTE — Assessment & Plan Note (Signed)
Blood pressure is low today. We have suggested he hold his chlorthalidone. Alternatively, he could cut his lisinopril and chlorthalidone in half. Weight loss is likely responsible for his lower blood pressure.

## 2010-11-26 NOTE — Assessment & Plan Note (Signed)
History of CAD and bypass. No symptoms of angina at this time. No further testing.

## 2010-11-26 NOTE — Progress Notes (Signed)
   Patient ID: Kurt Aguilar, male    DOB: September 20, 1945, 65 y.o.   MRN: [id number]  HPI 65 yo with history of CAD CABG back in 69 in [place], [place], HTN, hyperlipidemia, presents for routine followup.  Overall, he has been doing very well. He has decreased his weight from 235 pounds to 218 pounds over the last several months. He has been doing this by increased exercise and diet. He feels well. His blood pressure has been borderline low and occasionally has dizziness when he stands up too quickly.  stress test in March of 2011 shows mildly decreased perfusion in the anterior wall at rest with moderately decreased perfusion with stress. He had no significant symptoms with testing.  EKG shows normal sinus rhythm with rate 91 beats per minute, no significant ST or T wave changes.   Past Medical History: Last updated: 05/14/2010 1.  CAD s/p CABG 1997 x 5.  Done in [place], [place].  Myoview in 2006 in ? [place] was normal per patient.  2.  Diverticulitis, hx of 3.  Gout 4.  Hyperlipidemia 5.  Hypertension 6.  osteoarthritis: knee OA -- injections in past / cortisone  7.  GERD  8.  atypical migraine - only gets aura  9.  hyperglycemia -- borderline DM  10.  CKD 11. Obstructive sleep apnea- NPSG 04/17/10- Moderate OSA AHI 36.3/hr,,,,CPAP 2. Cough- PFT 03/18/10- Mild obstructive airways dz w/o resp to BD. FEV1 2.65/ 86%; Ratio 0.77   Review of Systems  Constitutional: Negative.   HENT: Negative.   Eyes: Negative.   Respiratory: Negative.   Cardiovascular: Negative.   Gastrointestinal: Negative.   Musculoskeletal: Negative.   Skin: Negative.   Neurological: Negative for syncope, speech difficulty and weakness.       Some dizzy symptoms with standing too quickly  Hematological: Negative.   Psychiatric/Behavioral: Negative.       Physical Exam  Constitutional: He is oriented to person, place, and time. He appears well-developed and well-nourished.  HENT:  Head: Normocephalic.  Nose:  Nose normal.  Mouth/Throat: Oropharynx is clear and moist.  Eyes: Conjunctivae are normal. Pupils are equal, round, and reactive to light.  Neck: Normal range of motion. Neck supple. No JVD present.  Cardiovascular: Normal rate, regular rhythm, normal heart sounds and intact distal pulses.  Exam reveals no gallop and no friction rub.   No murmur heard. Pulmonary/Chest: Effort normal and breath sounds normal. No respiratory distress. He has no wheezes. He has no rales. He exhibits no tenderness.  Abdominal: Soft. Bowel sounds are normal. He exhibits no distension. There is no tenderness.  Musculoskeletal: Normal range of motion. He exhibits no edema and no tenderness.  Lymphadenopathy:    He has no cervical adenopathy.  Neurological: He is alert and oriented to person, place, and time. Coordination normal.  Skin: Skin is warm and dry. No rash noted. No erythema.  Psychiatric: He has a normal mood and affect. His behavior is normal. Judgment and thought content normal.     Assessment and Plan:

## 2010-11-26 NOTE — Patient Instructions (Addendum)
  Would monitor blood pressure. If this continues to be low or you have continued symptoms of dizziness with standing, would change the chlorthalidone to one half tab, possibly even holding chlorthalidone altogether if symptoms persist. Contact our office if blood pressure continues to be borderline low. Would also suggest repeat cholesterol check in 6 months time given continued weight loss and possible need to decrease the dose of statin or zetia.  Follow up in 1 year.

## 2010-11-26 NOTE — Assessment & Plan Note (Signed)
Blood pressure is well controlled on his current medication regimen. No changes were made

## 2010-11-27 ENCOUNTER — Encounter: Payer: Self-pay | Admitting: Cardiovascular Disease

## 2010-11-29 ENCOUNTER — Ambulatory Visit (INDEPENDENT_AMBULATORY_CARE_PROVIDER_SITE_OTHER): Payer: Medicare PPO | Admitting: Family Medicine

## 2010-11-29 ENCOUNTER — Encounter: Payer: Self-pay | Admitting: Family Medicine

## 2010-11-29 DIAGNOSIS — N259 Disorder resulting from impaired renal tubular function, unspecified: Secondary | ICD-10-CM

## 2010-11-29 DIAGNOSIS — E785 Hyperlipidemia, unspecified: Secondary | ICD-10-CM

## 2010-11-29 DIAGNOSIS — I10 Essential (primary) hypertension: Secondary | ICD-10-CM

## 2010-11-29 DIAGNOSIS — R7309 Other abnormal glucose: Secondary | ICD-10-CM

## 2010-11-29 NOTE — Assessment & Plan Note (Signed)
bp is too low  Off chlorthalidone Symptomatic Stop the lisinopril  Re check upon return from trip in June  Update if not imp clinically before then

## 2010-11-29 NOTE — Assessment & Plan Note (Signed)
Improved LDL with zetia and crestor  Avoid red meat/ fried foods/ egg yolks/ fatty breakfast meats/ butter, cheese and high fat dairy/ and shellfish   Will work on trig and HDL and keep exercising  F/u 6 months

## 2010-11-29 NOTE — Patient Instructions (Addendum)
Please schedule lab in June for renal profile for renal insufficiency  If this is not improved I may refer you to renal doctor for kidneys Keep working on water intake and good habits for weight loss -- you are doing well  Follow up with me in about 6 months  Stop the lisinopril and chlorthalidone due to low bp If this does not improve let me know

## 2010-11-29 NOTE — Progress Notes (Signed)
Subjective:    Patient ID: Kurt Aguilar, male    DOB: [birth year], 65 y.o.   MRN: [id number]  HPI  Is having trouble with low blood pressure -- stopped his chlorthalidone  Still low and symptomatic    Here to follow up for HTN and hyperglycemia and renal insuff and hyperlipidemia   Wt is down 11 lb with better habits  Is doing well with diet and exercise  More salad and oatmeal and walks 3 miles -- is proud of what he is doing  Frustrated because wt plateaus at times    HTN-- bp on low side-- ? Orthostatic symptoms so cardiol dec/ stopped chlorthalidone  86/54 today  Hyperglycemia - sugar is a bit better at fasting 110 microalb is up a bit however   Renal insuff is worse with Cr of 1.5 Has been drinking more water  ua is nl  slt inc microalb Is on lisinopril  Was monitoring this at [place] -- was seen in the renal dept in the past CKD early stage   Lipids have inc trig at 241 and lower HDL at 37 and lower LDL at 56--is on crestor and zetia (cardiol) Cardiologist was really happy with this   Past Medical History  Diagnosis Date  . Diverticulitis   . Gout   . Hyperlipidemia   . Hypertension   . Osteoporosis   . GERD (gastroesophageal reflux disease)   . Atypical migraine   . Hyperglycemia     Past Surgical History  Procedure Date  . Open heart surgery 09/09/1995-1998    5 bypass  . Tonsillectomy   . Repair knee ligament     right    History   Social History  . Marital Status: Married    Spouse Name: N/A    Number of Children: 0  . Years of Education: N/A   Occupational History  . Restaurant owner    Social History Main Topics  . Smoking status: Former Smoker -- 1.0 packs/day for 3 years    Types: Cigars    Quit date: 11/25/2004  . Smokeless tobacco: Not on file   Comment: occas. cigar  . Alcohol Use: 0.5 oz/week    1 drink(s) per week     occas. social  . Drug Use: No  . Sexually Active: Not on file   Other Topics Concern  . Not on file    Social History Narrative  . No narrative on file    Family History  Problem Relation Age of Onset  . Heart disease Mother   . Heart disease Father   . Stroke Father         Review of Systems  Constitutional: Positive for fatigue. Negative for appetite change.  Eyes: Negative for visual disturbance.  Respiratory: Negative.  Negative for chest tightness and shortness of breath.   Cardiovascular: Negative for chest pain, palpitations and leg swelling.  Gastrointestinal: Negative for nausea and abdominal distention.  Genitourinary: Negative for urgency, frequency, hematuria and decreased urine volume.  Skin: Negative for pallor.  Neurological: Positive for dizziness. Negative for syncope, weakness, numbness and headaches.  Hematological: Does not bruise/bleed easily.  Psychiatric/Behavioral: Negative for dysphoric mood.       Objective:   Physical Exam  Constitutional: He appears well-developed and well-nourished.       overwt and well appearing   HENT:  Head: Normocephalic and atraumatic.  Eyes: Conjunctivae and EOM are normal. Pupils are equal, round, and reactive to light.  Neck: Normal range of  motion. No thyromegaly present.  Cardiovascular: Normal rate and regular rhythm.   Pulmonary/Chest: Effort normal and breath sounds normal.  Abdominal: Soft. Bowel sounds are normal. He exhibits no mass. There is no tenderness.  Musculoskeletal: He exhibits no edema.       Mild LS tenderness  Lymphadenopathy:    He has no cervical adenopathy.  Neurological: He is alert. He displays normal reflexes. No sensory deficit.  Skin: Skin is warm and dry.  Psychiatric: He has a normal mood and affect.          Assessment & Plan:

## 2010-11-29 NOTE — Assessment & Plan Note (Signed)
This is worse without reason ua nl  slt inc microalb  Water intake good Stopping chlorthalidone and ace at this time (for hypotension also )  Lab in June  If not imp needs renal ref

## 2010-11-29 NOTE — Assessment & Plan Note (Signed)
This is fairly stable  Continue effort at wt loss

## 2011-02-27 ENCOUNTER — Other Ambulatory Visit (INDEPENDENT_AMBULATORY_CARE_PROVIDER_SITE_OTHER): Payer: Medicare PPO

## 2011-02-27 DIAGNOSIS — N259 Disorder resulting from impaired renal tubular function, unspecified: Secondary | ICD-10-CM

## 2011-02-27 LAB — RENAL FUNCTION PANEL
CO2: 32 mEq/L (ref 19–32)
Chloride: 102 mEq/L (ref 96–112)
GFR: 59.5 mL/min — ABNORMAL LOW (ref >60.00)

## 2011-03-05 ENCOUNTER — Telehealth: Payer: Self-pay

## 2011-03-05 NOTE — Telephone Encounter (Signed)
Patient notified as instructed by telephone. Pt wanted to come same day wife had appt so scheduled 05/13/11 at 2pm.

## 2011-03-05 NOTE — Telephone Encounter (Signed)
Message copied by Helene Shoe on Wed Mar 05, 2011 11:40 AM ------      Message from: Loura Pardon A      Created: Fri Feb 28, 2011  3:16 PM       Cr is improved - still mildly high       F/u later this summer to disc and also to re check bp      Make sure to drink enough water

## 2011-04-02 ENCOUNTER — Other Ambulatory Visit: Payer: Self-pay | Admitting: *Deleted

## 2011-04-02 MED ORDER — POTASSIUM CHLORIDE CRYS ER 20 MEQ PO TBCR: 20.0000 meq | EXTENDED_RELEASE_TABLET | Freq: Every day | ORAL | Status: DC

## 2011-04-02 MED ORDER — OMEPRAZOLE 20 MG PO CPDR: 20.0000 mg | DELAYED_RELEASE_CAPSULE | Freq: Two times a day (BID) | ORAL | Status: DC

## 2011-04-02 MED ORDER — METOPROLOL TARTRATE 50 MG PO TABS: 25.0000 mg | ORAL_TABLET | Freq: Two times a day (BID) | ORAL | Status: DC

## 2011-04-02 MED ORDER — EZETIMIBE 10 MG PO TABS: 10.0000 mg | ORAL_TABLET | Freq: Every day | ORAL | Status: DC

## 2011-04-02 MED ORDER — CHLORTHALIDONE 50 MG PO TABS: ORAL_TABLET | ORAL | Status: DC

## 2011-04-02 MED ORDER — ROSUVASTATIN CALCIUM 40 MG PO TABS: 40.0000 mg | ORAL_TABLET | Freq: Every day | ORAL | Status: DC

## 2011-04-02 MED ORDER — ALLOPURINOL 100 MG PO TABS: 100.0000 mg | ORAL_TABLET | Freq: Two times a day (BID) | ORAL | Status: DC

## 2011-05-13 ENCOUNTER — Encounter: Payer: Self-pay | Admitting: Family Medicine

## 2011-05-13 ENCOUNTER — Ambulatory Visit (INDEPENDENT_AMBULATORY_CARE_PROVIDER_SITE_OTHER): Payer: Medicare PPO | Admitting: Family Medicine

## 2011-05-13 VITALS — BP 118/72 | HR 70 | Temp 97.9°F | Wt 232.5 lb

## 2011-05-13 DIAGNOSIS — G8929 Other chronic pain: Secondary | ICD-10-CM | POA: Insufficient documentation

## 2011-05-13 DIAGNOSIS — E785 Hyperlipidemia, unspecified: Secondary | ICD-10-CM

## 2011-05-13 DIAGNOSIS — J40 Bronchitis, not specified as acute or chronic: Secondary | ICD-10-CM

## 2011-05-13 DIAGNOSIS — M545 Low back pain, unspecified: Secondary | ICD-10-CM

## 2011-05-13 DIAGNOSIS — I10 Essential (primary) hypertension: Secondary | ICD-10-CM

## 2011-05-13 DIAGNOSIS — R7309 Other abnormal glucose: Secondary | ICD-10-CM

## 2011-05-13 DIAGNOSIS — N259 Disorder resulting from impaired renal tubular function, unspecified: Secondary | ICD-10-CM

## 2011-05-13 MED ORDER — PREDNISONE 10 MG PO TABS: ORAL_TABLET | ORAL | Status: DC

## 2011-05-13 MED ORDER — ALBUTEROL SULFATE HFA 108 (90 BASE) MCG/ACT IN AERS: 2.0000 | INHALATION_SPRAY | RESPIRATORY_TRACT | Status: DC | PRN

## 2011-05-13 NOTE — Progress Notes (Signed)
Subjective:    Patient ID: Kurt Aguilar, male    DOB: [birth year], 65 y.o.   MRN: [id number]  HPI Here for f/u of HTN and lipids and renal insuff and hyperglycemia  Also bronchitis for 3 weeks  Started as a cold 3 weeks ago with sore throat and nasal symptoms / congestion  Cough started 5 days ago  occ productive and getting worse- and is worried about that -- clear discharge  Had to have a steroid last time   Used to smoke years ago - very light  Does not have asthma  No trouble breathing - but cpap is laborius  Has px for ventolin but not using it   Is going to [place] on Sunday      Lipids were stable in march Lab Results  Component Value Date   CHOL 127 11/25/2010   CHOL 151 09/04/2010   CHOL 193 03/06/2010   Lab Results  Component Value Date   HDL 37.70* 11/25/2010   HDL 45.30 09/04/2010   HDL 42.20 03/06/2010   Lab Results  Component Value Date   LDLCALC 72 09/04/2010   LDLCALC 91 07/19/2009   [place] 85 04/17/2009   Lab Results  Component Value Date   TRIG 241.0* 11/25/2010   TRIG 170.0* 09/04/2010   TRIG 249.0* 03/06/2010   Lab Results  Component Value Date   CHOLHDL 3 11/25/2010   CHOLHDL 3 09/04/2010   CHOLHDL 5 03/06/2010   Lab Results  Component Value Date   LDLDIRECT 56.5 11/25/2010   LDLDIRECT 109.0 03/06/2010   LDLDIRECT 75.7 12/05/2009   diet= admittedly not as good   Did gain 13 lb from last visit He hurt his back and unable to exercise  Problems L5 - disc problem -- almost operated more than once  Starting to get a little better- walked almost a mile today  Yoga tends to help   Came off the chlorthalidone  Had to go back on that - was on a trip and bp started to fluctuate a lot  No low blood pressures either  bp was too low at last visit  118/72 today- good No ha or cp  Renal insuff -in June his cr 1.3 down from 1.5 ( still on chlorthalidone)  Has hypertensive nephropathy Used to see kidney doc at [place]- has not been since   Hyperglycemia   Sugar 122 Need to check a1c Knows it may be up due to bad diet and wt gain  Now is watching sugar in diet again  Also wants to start exercise when back problem improves   Patient Active Problem List  Diagnoses  . HYPERLIPIDEMIA  . GOUT  . OBSTRUCTIVE SLEEP APNEA  . HYPERTENSION  . CAD  . GERD  . RENAL INSUFFICIENCY  . HYPERGLYCEMIA  . DIVERTICULITIS, HX OF  . Chronic low back pain  . Bronchitis   Past Medical History  Diagnosis Date  . Diverticulitis   . Gout   . Hyperlipidemia   . Hypertension   . Osteoporosis   . GERD (gastroesophageal reflux disease)   . Atypical migraine   . Hyperglycemia    Past Surgical History  Procedure Date  . Open heart surgery 09/09/1995-1998    5 bypass  . Tonsillectomy   . Repair knee ligament     right   History  Substance Use Topics  . Smoking status: Former Smoker -- 1.0 packs/day for 3 years    Types: Cigars    Quit date: 11/25/2004  .  Smokeless tobacco: Not on file   Comment: occas. cigar  . Alcohol Use: 0.5 oz/week    1 drink(s) per week     occas. social   Family History  Problem Relation Age of Onset  . Heart disease Mother   . Heart disease Father   . Stroke Father    Allergies  Allergen Reactions  . Oxycodone-Acetaminophen    Current Outpatient Prescriptions on File Prior to Visit  Medication Sig Dispense Refill  . acetaminophen (TYLENOL) 500 MG tablet Take 500 mg by mouth 2 (two) times daily.        Marland Kitchen allopurinol (ZYLOPRIM) 100 MG tablet Take 1 tablet (100 mg total) by mouth 2 (two) times daily.  180 tablet  3  . Ascorbic Acid (VITAMIN C) 1000 MG tablet Take 500 mg by mouth daily.       Marland Kitchen aspirin 81 MG tablet Take 81 mg by mouth 2 (two) times daily.        . cetirizine (ZYRTEC) 10 MG tablet Take 10 mg by mouth daily.        . chlorthalidone (HYGROTON) 50 MG tablet Take one half tablet by mouth daily.  45 tablet  3  . Cholecalciferol (VITAMIN D) 1000 UNITS capsule Take 1,000 Units by mouth daily.        Marland Kitchen  ezetimibe (ZETIA) 10 MG tablet Take 1 tablet (10 mg total) by mouth daily.  90 tablet  3  . fish oil-omega-3 fatty acids 1000 MG capsule Take 1 g by mouth 2 (two) times daily.       . Glucosamine-Chondroit-Vit C-Mn (GLUCOSAMINE 1500 COMPLEX) CAPS Take 1,500 capsules by mouth 2 (two) times daily.        . Lutein 20 MG CAPS Take 1 capsule by mouth daily.        . metoprolol (LOPRESSOR) 50 MG tablet Take 0.5 tablets (25 mg total) by mouth 2 (two) times daily.  90 tablet  3  . omeprazole (PRILOSEC) 20 MG capsule Take 1 capsule (20 mg total) by mouth 2 (two) times daily.  180 capsule  3  . potassium chloride SA (K-DUR,KLOR-CON) 20 MEQ tablet Take 1 tablet (20 mEq total) by mouth daily.  90 tablet  3  . rosuvastatin (CRESTOR) 40 MG tablet Take 1 tablet (40 mg total) by mouth daily.  90 tablet  3  . vardenafil (LEVITRA) 20 MG tablet Take 20 mg by mouth as needed.              Review of Systems Review of Systems  Constitutional: Negative for fever, appetite change, fatigue and unexpected weight change.  Eyes: Negative for pain and visual disturbance.  Respiratory: Negative for cough and shortness of breath.   Cardiovascular: Negative for cp or palpitations    Gastrointestinal: Negative for nausea, diarrhea and constipation.  Genitourinary: Negative for urgency and frequency.  Skin: Negative for pallor or rash   MSK pos for low back pain  Neurological: Negative for weakness, light-headedness, numbness and headaches.  Hematological: Negative for adenopathy. Does not bruise/bleed easily.  Psychiatric/Behavioral: Negative for dysphoric mood. The patient is not nervous/anxious.          Objective:   Physical Exam  Constitutional: He appears well-developed and well-nourished. No distress.       overwt and well appearing   HENT:  Head: Normocephalic and atraumatic.  Right Ear: External ear normal.  Left Ear: External ear normal.  Mouth/Throat: Oropharynx is clear and moist.  Nares are  boggy with some post nasal drip  Eyes: Conjunctivae and EOM are normal. Pupils are equal, round, and reactive to light.  Neck: Normal range of motion. Neck supple. No JVD present. Carotid bruit is not present. No thyromegaly present.  Cardiovascular: Normal rate, regular rhythm, normal heart sounds and intact distal pulses.   Pulmonary/Chest: Effort normal and breath sounds normal. No respiratory distress. He has no wheezes. He has no rales. He exhibits no tenderness.       Generally harsh bs Scant wheeze on forced exp only  Cough is dry sounding and harsh as well   Abdominal: Soft. Bowel sounds are normal. He exhibits no distension, no abdominal bruit and no mass. There is no tenderness.  Musculoskeletal: Normal range of motion. He exhibits tenderness. He exhibits no edema.       Mild LS tenderness L3- L5 rom limted by pain - flex 50 deg and ext 10 deg Nl gait  Neg SLR bilaterally  Nl rom both hips  Lymphadenopathy:    He has no cervical adenopathy.  Neurological: He is alert. He has normal reflexes.  Skin: Skin is warm and dry. No rash noted. No erythema. No pallor.  Psychiatric: He has a normal mood and affect.          Assessment & Plan:

## 2011-05-13 NOTE — Assessment & Plan Note (Signed)
bp went up with wt gain so started back on chlorthalidone Lab today Good control Need to watch renal insuff Work on wt loss F/u 3 mo

## 2011-05-13 NOTE — Patient Instructions (Signed)
Take the prednisone taper for bronchitis  If worse/ fever/ shortness of breath - let us know  We will do ref to ortho at check out  Lab today  Drink fluids Cut portions and exercise when able for weight loss Follow up in 3 months  See your dermatologist about skin spots

## 2011-05-13 NOTE — Assessment & Plan Note (Signed)
With wt gain - expect a1c to be up  No symptoms  Disc imp of low glycemic diet Lab today F/u 3 mo

## 2011-05-13 NOTE — Assessment & Plan Note (Signed)
This is intermittent with hx of disc dz in past and with symptoms severe enough to stop exercise and cause 13 lb wt gain Ref to ortho to eval further and make tx plan

## 2011-05-13 NOTE — Assessment & Plan Note (Signed)
With mild reactive airways and cough  tx with short course of prednisone Do not suspect bacterial infx but will let me know if worse or fever

## 2011-05-13 NOTE — Assessment & Plan Note (Signed)
Per pt from HTNsive nephropathy bp in good control On diuretic - ? If too harsh on kidney Lab today Watch carefully  Low threshold to ref to renal  Last a1c 1.3 was imp Disc imp of water intake

## 2011-05-14 LAB — RENAL FUNCTION PANEL
Albumin: 4.5 g/dL (ref 3.5–5.2)
BUN: 27 mg/dL — ABNORMAL HIGH (ref 6–23)
Creatinine, Ser: 1.3 mg/dL (ref 0.4–1.5)
Glucose, Bld: 84 mg/dL (ref 70–99)
Phosphorus: 3.7 mg/dL (ref 2.3–4.6)

## 2011-06-03 ENCOUNTER — Ambulatory Visit: Payer: Medicare PPO | Admitting: Family Medicine

## 2011-06-24 ENCOUNTER — Telehealth: Payer: Self-pay | Admitting: *Deleted

## 2011-06-24 MED ORDER — ZOSTER VACCINE LIVE 19400 UNT/0.65ML ~~LOC~~ SOLR: 0.6500 mL | Freq: Once | SUBCUTANEOUS | Status: DC

## 2011-06-24 NOTE — Telephone Encounter (Signed)
Px printed for pick up in IN box  

## 2011-06-24 NOTE — Telephone Encounter (Signed)
Pt needs order for zostavax to take to a pharmacy.  Wife checked with his insurance and it will be covered. Please call wife when ready and she will pick up.

## 2011-06-24 NOTE — Telephone Encounter (Signed)
Script placed up front for pick up, left message on wife's voice mail advising her.

## 2011-08-04 ENCOUNTER — Other Ambulatory Visit: Payer: Self-pay | Admitting: *Deleted

## 2011-08-04 MED ORDER — ROSUVASTATIN CALCIUM 40 MG PO TABS: 40.0000 mg | ORAL_TABLET | Freq: Every day | ORAL | Status: DC

## 2011-08-15 ENCOUNTER — Ambulatory Visit: Payer: Medicare PPO | Admitting: Family Medicine

## 2011-10-03 ENCOUNTER — Encounter: Payer: Self-pay | Admitting: Internal Medicine

## 2011-10-03 ENCOUNTER — Ambulatory Visit (INDEPENDENT_AMBULATORY_CARE_PROVIDER_SITE_OTHER): Payer: Medicare PPO | Admitting: Internal Medicine

## 2011-10-03 VITALS — BP 132/70 | HR 59 | Ht 71.0 in | Wt 233.2 lb

## 2011-10-03 DIAGNOSIS — G4733 Obstructive sleep apnea (adult) (pediatric): Secondary | ICD-10-CM

## 2011-10-03 NOTE — Progress Notes (Signed)
10/03/11- 77 yoM former smoker followed for OSA Wife here LOV 10/01/10 She returns for one year followup, still fully compliant with CPAP 11/[employer]. He says he sleeps well through the night. He likes his current fullface mask and humidifier. His wife notes only minimal snoring without witnessed apnea. He feels well rested and sees no need for any changes.  ROS-see HPI Constitutional:   No-   weight loss, night sweats, fevers, chills, fatigue, lassitude. HEENT:   No-  headaches, difficulty swallowing, tooth/dental problems, sore throat,       No-  sneezing, itching, ear ache, nasal congestion, post nasal drip,  CV:  No-   chest pain, orthopnea, PND, swelling in lower extremities, anasarca, dizziness, palpitations Resp: No-   shortness of breath with exertion or at rest.              No-   productive cough,  No non-productive cough,  No- coughing up of blood.              No-   change in color of mucus.  No- wheezing.   Skin: No-   rash or lesions. GI:  No-   heartburn, indigestion, abdominal pain, nausea, vomiting, diarrhea,                 change in bowel habits, loss of appetite GU:  MS:  No-   joint pain or swelling.  No- decreased range of motion.  No- back pain. Neuro-     nothing unusual Psych:  No- change in mood or affect. No depression or anxiety.  No memory loss.   OBJ General- Alert, Oriented, Affect-appropriate, Distress- none acute Skin- rash-none, lesions- none, excoriation- none Lymphadenopathy- none Head- atraumatic            Eyes- Gross vision intact, PERRLA, conjunctivae clear secretions            Ears- Hearing, canals-normal            Nose- Clear, no-Septal dev, mucus, polyps, erosion, perforation             Throat- Mallampati II , mucosa clear , drainage- none, tonsils- atrophic Neck- flexible , trachea midline, no stridor , thyroid nl, carotid no bruit Chest - symmetrical excursion , unlabored           Heart/CV- RRR , no murmur , no gallop  , no rub, nl s1 s2                         - JVD- none , edema- none, stasis changes- none, varices- none           Lung- clear to P&A, wheeze- none, cough- none , dullness-none, rub- none           Chest wall-  Abd- Br/ Gen/ Rectal- Not done, not indicated Extrem- cyanosis- none, clubbing, none, atrophy- none, strength- nl Neuro- grossly intact to observation

## 2011-10-03 NOTE — Patient Instructions (Signed)
Continue CPAP 11 - please call as needed

## 2011-10-05 NOTE — Assessment & Plan Note (Signed)
Good compliance and control. We reviewed general comfort measures. No changes needed.

## 2011-10-23 ENCOUNTER — Encounter: Payer: Self-pay | Admitting: Family Medicine

## 2012-02-12 ENCOUNTER — Ambulatory Visit (INDEPENDENT_AMBULATORY_CARE_PROVIDER_SITE_OTHER): Payer: Medicare PPO | Admitting: Cardiovascular Disease

## 2012-02-12 ENCOUNTER — Encounter: Payer: Self-pay | Admitting: Cardiovascular Disease

## 2012-02-12 VITALS — BP 130/70 | HR 74 | Ht 70.0 in | Wt 234.2 lb

## 2012-02-12 DIAGNOSIS — I2510 Atherosclerotic heart disease of native coronary artery without angina pectoris: Secondary | ICD-10-CM

## 2012-02-12 DIAGNOSIS — E785 Hyperlipidemia, unspecified: Secondary | ICD-10-CM

## 2012-02-12 DIAGNOSIS — I10 Essential (primary) hypertension: Secondary | ICD-10-CM

## 2012-02-12 MED ORDER — FUROSEMIDE 20 MG PO TABS: 20.0000 mg | ORAL_TABLET | Freq: Every day | ORAL | Status: DC | PRN

## 2012-02-12 NOTE — Progress Notes (Signed)
Patient ID: Kurt Aguilar, male    DOB: 10/06/1945, 66 y.o.   MRN: [id number]  HPI Comments: 66 yo with history of CAD CABG back in 80 in [place], [place], HTN, hyperlipidemia, presents for routine followup.  He has been traveling around the country and with his travels, his weight has increased 20 pounds. He reports that he feels well, no shortness of breath or chest pain. He does have mild swelling around his ankles after long trips. He is interested in a diuretic to take as needed. He is planning additional trips to [place] at the end of the summer.  stress test in March of 2011 shows mildly decreased perfusion in the anterior wall at rest with moderately decreased perfusion with stress. He had no significant symptoms with testing.  EKG shows normal sinus rhythm with rate 74 beats per minute, no significant ST or T wave changes.    Past Medical History: Last updated: 05/14/2010 1.  CAD s/p CABG 1997 x 5.  Done in [place], [place].  Myoview in 2006 in ? [place] was normal per patient.  2.  Diverticulitis, hx of 3.  Gout 4.  Hyperlipidemia 5.  Hypertension 6.  osteoarthritis: knee OA -- injections in past / cortisone  7.  GERD  8.  atypical migraine - only gets aura  9.  hyperglycemia -- borderline DM  10.  CKD 11. Obstructive sleep apnea- NPSG 04/17/10- Moderate OSA AHI 36.3/hr,,,,CPAP 2. Cough- PFT 03/18/10- Mild obstructive airways dz w/o resp to BD. FEV1 2.65/ 86%; Ratio 0.77   Outpatient Encounter Prescriptions as of 02/12/2012  Medication Sig Dispense Refill  . acetaminophen (TYLENOL) 500 MG tablet Take 500 mg by mouth 2 (two) times daily.        Marland Kitchen albuterol (VENTOLIN HFA) 108 (90 BASE) MCG/ACT inhaler Inhale 2 puffs into the lungs every 4 (four) hours as needed for shortness of breath (tight lungs ).  1 Inhaler  11  . allopurinol (ZYLOPRIM) 100 MG tablet Take 1 tablet (100 mg total) by mouth 2 (two) times daily.  180 tablet  3  . aspirin 81 MG tablet Take by mouth. Takes 2 tablets  daily.      . cetirizine (ZYRTEC) 10 MG tablet Take 10 mg by mouth as needed.       . chlorthalidone (HYGROTON) 25 MG tablet Take 25 mg by mouth daily.      Marland Kitchen ezetimibe (ZETIA) 10 MG tablet Take 1 tablet (10 mg total) by mouth daily.  90 tablet  3  . fish oil-omega-3 fatty acids 1000 MG capsule Take 1 g by mouth 2 (two) times daily.       . Glucosamine-Chondroit-Vit C-Mn (GLUCOSAMINE 1500 COMPLEX) CAPS Take 1,500 capsules by mouth 2 (two) times daily.        . Lutein 20 MG CAPS Take 1 capsule by mouth daily.        . metoprolol tartrate (LOPRESSOR) 25 MG tablet Take 25 mg by mouth 2 (two) times daily.      Marland Kitchen omeprazole (PRILOSEC) 20 MG capsule Take 1 capsule (20 mg total) by mouth 2 (two) times daily.  180 capsule  3  . potassium chloride SA (K-DUR,KLOR-CON) 20 MEQ tablet Take 1 tablet (20 mEq total) by mouth daily.  90 tablet  3  . rosuvastatin (CRESTOR) 40 MG tablet Take 1 tablet (40 mg total) by mouth daily.  14 tablet  0  . vardenafil (LEVITRA) 20 MG tablet Take 20 mg by mouth as needed.  Review of Systems  Constitutional: Negative.   HENT: Negative.   Eyes: Negative.   Respiratory: Negative.   Cardiovascular: Positive for leg swelling.  Gastrointestinal: Negative.   Musculoskeletal: Negative.   Skin: Negative.   Neurological: Negative for syncope, speech difficulty and weakness.       Some dizzy symptoms with standing too quickly  Hematological: Negative.   Psychiatric/Behavioral: Negative.    BP 130/70  Pulse 74  Ht 5\' 10"  (1.778 m)  Wt 234 lb 4 oz (106.255 kg)  BMI 33.61 kg/m2  Physical Exam  Nursing note and vitals reviewed. Constitutional: He is oriented to person, place, and time. He appears well-developed and well-nourished.  HENT:  Head: Normocephalic.  Nose: Nose normal.  Mouth/Throat: Oropharynx is clear and moist.  Eyes: Conjunctivae are normal. Pupils are equal, round, and reactive to light.  Neck: Normal range of motion. Neck supple. No JVD present.    Cardiovascular: Normal rate, regular rhythm, S1 normal, S2 normal, normal heart sounds and intact distal pulses.  Exam reveals no gallop and no friction rub.   No murmur heard. Pulmonary/Chest: Effort normal and breath sounds normal. No respiratory distress. He has no wheezes. He has no rales. He exhibits no tenderness.  Abdominal: Soft. Bowel sounds are normal. He exhibits no distension. There is no tenderness.  Musculoskeletal: Normal range of motion. He exhibits no edema and no tenderness.  Lymphadenopathy:    He has no cervical adenopathy.  Neurological: He is alert and oriented to person, place, and time. Coordination normal.  Skin: Skin is warm and dry. No rash noted. No erythema.  Psychiatric: He has a normal mood and affect. His behavior is normal. Judgment and thought content normal.           Assessment and Plan

## 2012-02-12 NOTE — Assessment & Plan Note (Signed)
Currently with no symptoms of angina. No further workup at this time. Continue current medication regimen. 

## 2012-02-12 NOTE — Assessment & Plan Note (Signed)
We have encouraged him to work on his way to the summer and recheck his cholesterol in the early fall. He was previously at goal on his current medication regimen.

## 2012-02-12 NOTE — Patient Instructions (Signed)
You are doing well. Please take lasix as needed for significant edema, take with potassium  Please call us if you have new issues that need to be addressed before your next appt.  Your physician wants you to follow-up in: 12 months.  You will receive a reminder letter in the mail two months in advance. If you don't receive a letter, please call our office to schedule the follow-up appointment.

## 2012-02-12 NOTE — Assessment & Plan Note (Signed)
Blood pressure is well controlled on today's visit. No changes made to the medications. 

## 2012-03-15 ENCOUNTER — Telehealth: Payer: Self-pay | Admitting: Internal Medicine

## 2012-03-15 DIAGNOSIS — G4733 Obstructive sleep apnea (adult) (pediatric): Secondary | ICD-10-CM

## 2012-03-15 NOTE — Telephone Encounter (Signed)
Order- DME [facility] increase CPAP to 13     Dx OSA

## 2012-03-15 NOTE — Telephone Encounter (Signed)
Called spoke with patient, advised of CY's recs to increase his CPAP pressure.  Pt okay with this, verbalized his understandig and will call back if anything further is needed.  Order sent to [facility].

## 2012-03-15 NOTE — Telephone Encounter (Signed)
Called, spoke with pt who states he believes the pressure on his cpap needs to be increased because he is snoring with mask on.  Also, reports his wife has noticed he has stopped breathing a few times while sleeping over the past few wks.  Dr. Annamaria Boots, pls advise.  Thank you.

## 2012-03-15 NOTE — Telephone Encounter (Signed)
Patients current CPAP is at 11 through [place].

## 2012-04-07 ENCOUNTER — Telehealth: Payer: Self-pay | Admitting: Family Medicine

## 2012-04-07 NOTE — Telephone Encounter (Signed)
Noted.  Any worsening lower extremity edema?  If not, may see tomorrow, if worsening rec urgent care tonight.

## 2012-04-07 NOTE — Telephone Encounter (Signed)
Patient denied any LE swelling more than normal. Made appt tomorrow with Dr. Damita Dunnings. Advised to go to [facility] if symptoms worsen tonight. He verbalized understanding.

## 2012-04-07 NOTE — Telephone Encounter (Signed)
Noted  

## 2012-04-07 NOTE — Telephone Encounter (Signed)
[name]: Ashley/Patient; PCP: Ria Bush; CB#: [phone]; ; ; Call regarding Cough/Congestion;   Patient states he developed cough, nasal congestion, sore throat. Onset X 3 weeks. States sore throat and nasal congestion have resolved but non productive cough persists. States occasional wheezing when supine.  Afebrile. Patient taking fluids well. Triage per Cough Protocol. No emergent sx identified. Care advice given per guidelines. Patient advised increased fluids, avoid irritants, inhaled steam, humidifier. Call back parameters reviewed. Patient verbalizes understanding. Disposition of "See within 4 hours"  due to positive triage assessment for " New or worsening cough AND known cardiac or respiratory condition." No appts. available in [place]. PLEASE RETURN CALL REGARDING APPT. WITHIN 4 HOURS FOR COUGH/INTERMITTENT WHEEZING. PATIENT CAN BE REACHED @ [phone]. Message sent with high priority to CAN Pool via Epic EHR.

## 2012-04-08 ENCOUNTER — Encounter: Payer: Self-pay | Admitting: Family Medicine

## 2012-04-08 ENCOUNTER — Ambulatory Visit (INDEPENDENT_AMBULATORY_CARE_PROVIDER_SITE_OTHER): Payer: Medicare PPO | Admitting: Family Medicine

## 2012-04-08 VITALS — BP 110/70 | HR 68 | Temp 97.7°F | Wt 233.0 lb

## 2012-04-08 DIAGNOSIS — R059 Cough, unspecified: Secondary | ICD-10-CM

## 2012-04-08 DIAGNOSIS — R05 Cough: Secondary | ICD-10-CM

## 2012-04-08 MED ORDER — PREDNISONE 10 MG PO TABS: ORAL_TABLET | ORAL | Status: DC

## 2012-04-08 NOTE — Progress Notes (Signed)
grandkids were sick.  He had gotten a cold and was worried about progression to bronchitis.   duration of symptoms: 3 weeks.   Rhinorrhea: yes congestion:yes ear pain: yes sore throat:yes Cough: yes, dry now, prev with some production Myalgias:no other concerns: no fevers now, but previously.   He does feel better except for the cough.   H/o barky cough responsive to steroids previously.   SABA helps some, but not full control.    ROS: See HPI.  Otherwise negative.    Meds, vitals, and allergies reviewed.   GEN: nad, alert and oriented HEENT: mucous membranes moist, TM w/o erythema, nasal epithelium not injected, OP with mild cobblestoning NECK: supple w/o LA CV: rrr. PULM: ctab, no inc wob, dry cough noted, no wheeze ABD: soft, +bs EXT: no edema

## 2012-04-08 NOTE — Assessment & Plan Note (Signed)
Likely related to inflammation/irritation after illness.  Doubt current infection. Would use short course of steroids with GI caution.  I'd like him to discuss with pulm.  He may need preemptive steroid inhaler during certain parts of the year.  He agrees. F/u prn.  Well appearing.

## 2012-04-08 NOTE — Patient Instructions (Addendum)
Take the prednisone with food.  If you don't improve or if you have a fever, notify us.  Ask Dr. Annamaria Boots about this when you see him again.

## 2012-06-14 ENCOUNTER — Other Ambulatory Visit: Payer: Self-pay

## 2012-06-14 NOTE — Telephone Encounter (Signed)
Pt left message requesting acid reflux med to [facility] source and then later in message said [facility]. Left v/m for pt to call back with name of med requested and which pharmacy to send to.

## 2012-06-15 ENCOUNTER — Other Ambulatory Visit: Payer: Self-pay | Admitting: *Deleted

## 2012-06-15 MED ORDER — OMEPRAZOLE 20 MG PO CPDR: 20.0000 mg | DELAYED_RELEASE_CAPSULE | Freq: Two times a day (BID) | ORAL | Status: DC

## 2012-06-15 MED ORDER — EZETIMIBE 10 MG PO TABS: 10.0000 mg | ORAL_TABLET | Freq: Every day | ORAL | Status: DC

## 2012-06-15 MED ORDER — ROSUVASTATIN CALCIUM 40 MG PO TABS: 40.0000 mg | ORAL_TABLET | Freq: Every day | ORAL | Status: DC

## 2012-06-15 MED ORDER — METOPROLOL TARTRATE 25 MG PO TABS: 25.0000 mg | ORAL_TABLET | Freq: Two times a day (BID) | ORAL | Status: DC

## 2012-06-15 NOTE — Telephone Encounter (Signed)
Ok to refill? No recent appt with you and no recent labs

## 2012-06-15 NOTE — Telephone Encounter (Signed)
Spoke with pts wife and omeprazole was filled already this AM.

## 2012-06-15 NOTE — Telephone Encounter (Signed)
Please make him a follow up with me in December and refil meds till then, thanks

## 2012-06-16 MED ORDER — CHLORTHALIDONE 25 MG PO TABS: 25.0000 mg | ORAL_TABLET | Freq: Every day | ORAL | Status: DC

## 2012-06-16 MED ORDER — ALLOPURINOL 100 MG PO TABS: 100.0000 mg | ORAL_TABLET | Freq: Two times a day (BID) | ORAL | Status: DC

## 2012-06-16 MED ORDER — POTASSIUM CHLORIDE CRYS ER 20 MEQ PO TBCR: 20.0000 meq | EXTENDED_RELEASE_TABLET | Freq: Every day | ORAL | Status: DC

## 2012-06-16 NOTE — Telephone Encounter (Signed)
Called pt an made f/u appt for dec, and refilled med

## 2012-08-18 ENCOUNTER — Encounter: Payer: Self-pay | Admitting: Family Medicine

## 2012-08-18 ENCOUNTER — Ambulatory Visit (INDEPENDENT_AMBULATORY_CARE_PROVIDER_SITE_OTHER): Payer: Medicare PPO | Admitting: Family Medicine

## 2012-08-18 VITALS — BP 116/60 | HR 70 | Temp 97.7°F | Ht 71.0 in | Wt 230.8 lb

## 2012-08-18 DIAGNOSIS — R7309 Other abnormal glucose: Secondary | ICD-10-CM

## 2012-08-18 DIAGNOSIS — N259 Disorder resulting from impaired renal tubular function, unspecified: Secondary | ICD-10-CM

## 2012-08-18 DIAGNOSIS — E785 Hyperlipidemia, unspecified: Secondary | ICD-10-CM

## 2012-08-18 DIAGNOSIS — I10 Essential (primary) hypertension: Secondary | ICD-10-CM

## 2012-08-18 NOTE — Assessment & Plan Note (Signed)
Hx of hypertensive nephropathy  Good control now  Lab today  Lab Results  Component Value Date   CREATININE 1.3 05/13/2011   enc water intake

## 2012-08-18 NOTE — Assessment & Plan Note (Signed)
Lab today Disc imp to heart and overall health On crestor and diet and zetia currently

## 2012-08-18 NOTE — Progress Notes (Signed)
Subjective:    Patient ID: Kurt Aguilar, male    DOB: 05-30-1946, 66 y.o.   MRN: [id number]  HPI Here for f/u of chronic health conditions   Wt is down 3 lb with bmi of 32 Is doing well overall  Down 4 lb since June  Eating less and more healthy diet  Measuring protein  Lot of exercise - water aerobics - 3 d per week and yoga 2 d and some golf   CAD has been stable  Sleep apnea-on cpap= doing well with that  bp is stable today  No cp or palpitations or headaches or edema  No side effects to medicines  BP Readings from Last 3 Encounters:  08/18/12 116/60  04/08/12 110/70  02/12/12 130/70     Hyperlipidemia On statin - crestor and zetia Lab Results  Component Value Date   CHOL 127 11/25/2010   HDL 37.70* 11/25/2010   LDLCALC 72 09/04/2010   LDLDIRECT 56.5 11/25/2010   TRIG 241.0* 11/25/2010   CHOLHDL 3 11/25/2010   due for a check   Hyperglycemia Lab Results  Component Value Date   HGBA1C 6.1 05/13/2011     Fall hx no falls at all   Mood --has been very good -- with good motivation   Had flu vaccine   Patient Active Problem List  Diagnosis  . HYPERLIPIDEMIA  . GOUT  . OBSTRUCTIVE SLEEP APNEA  . HYPERTENSION  . CAD  . GERD  . RENAL INSUFFICIENCY  . HYPERGLYCEMIA  . DIVERTICULITIS, HX OF  . Chronic low back pain  . Bronchitis  . Cough   Past Medical History  Diagnosis Date  . Diverticulitis   . Gout   . Hyperlipidemia   . Hypertension   . Osteoporosis   . GERD (gastroesophageal reflux disease)   . Atypical migraine   . Hyperglycemia    Past Surgical History  Procedure Date  . Open heart surgery 09/09/1995-1998    5 bypass  . Tonsillectomy   . Repair knee ligament     right   History  Substance Use Topics  . Smoking status: Former Smoker -- 1.0 packs/day for 3 years    Types: Cigars    Quit date: 11/25/2004  . Smokeless tobacco: Not on file     Comment: occas. cigar quit 2006  . Alcohol Use: 0.5 oz/week    1 drink(s) per week   Comment: occas. social   Family History  Problem Relation Age of Onset  . Heart disease Mother   . Heart disease Father   . Stroke Father    Allergies  Allergen Reactions  . Oxycodone-Acetaminophen    Current Outpatient Prescriptions on File Prior to Visit  Medication Sig Dispense Refill  . acetaminophen (TYLENOL) 500 MG tablet Take 500 mg by mouth 2 (two) times daily.        Marland Kitchen albuterol (VENTOLIN HFA) 108 (90 BASE) MCG/ACT inhaler Inhale 2 puffs into the lungs every 4 (four) hours as needed for shortness of breath (tight lungs ).  1 Inhaler  11  . allopurinol (ZYLOPRIM) 100 MG tablet Take 1 tablet (100 mg total) by mouth 2 (two) times daily.  180 tablet  0  . aspirin 81 MG tablet Take by mouth. Takes 2 tablets daily.      . cetirizine (ZYRTEC) 10 MG tablet Take 10 mg by mouth as needed.       . chlorthalidone (HYGROTON) 25 MG tablet Take 1 tablet (25 mg total) by mouth daily.  90 tablet  0  . ezetimibe (ZETIA) 10 MG tablet Take 1 tablet (10 mg total) by mouth daily.  90 tablet  0  . fish oil-omega-3 fatty acids 1000 MG capsule Take 1 g by mouth 2 (two) times daily.       . furosemide (LASIX) 20 MG tablet Take 1 tablet (20 mg total) by mouth daily as needed.  30 tablet  3  . Glucosamine-Chondroit-Vit C-Mn (GLUCOSAMINE 1500 COMPLEX) CAPS Take 1,500 capsules by mouth 2 (two) times daily.        . Lutein 20 MG CAPS Take 1 capsule by mouth daily.        . metoprolol tartrate (LOPRESSOR) 25 MG tablet Take 1 tablet (25 mg total) by mouth 2 (two) times daily.  180 tablet  0  . omeprazole (PRILOSEC) 20 MG capsule Take 1 capsule (20 mg total) by mouth 2 (two) times daily.  60 capsule  0  . potassium chloride SA (K-DUR,KLOR-CON) 20 MEQ tablet Take 1 tablet (20 mEq total) by mouth daily.  90 tablet  0  . predniSONE (DELTASONE) 10 MG tablet Take 3 pills by mouth daily for 3 days then 2 pills daily for 3 days then 1 pill daily for 3 days.  Take with food.  18 tablet  0  . rosuvastatin (CRESTOR) 40 MG  tablet Take 1 tablet (40 mg total) by mouth daily.  90 tablet  0  . vardenafil (LEVITRA) 20 MG tablet Take 20 mg by mouth as needed.        . vitamin C (ASCORBIC ACID) 500 MG tablet Take 500 mg by mouth daily.          Review of Systems Review of Systems  Constitutional: Negative for fever, appetite change, fatigue and unexpected weight change.  Eyes: Negative for pain and visual disturbance.  Respiratory: Negative for cough and shortness of breath.   Cardiovascular: Negative for cp or palpitations    Gastrointestinal: Negative for nausea, diarrhea and constipation.  Genitourinary: Negative for urgency and frequency.  Skin: Negative for pallor or rash   Neurological: Negative for weakness, light-headedness, numbness and headaches.  Hematological: Negative for adenopathy. Does not bruise/bleed easily.  Psychiatric/Behavioral: Negative for dysphoric mood. The patient is not nervous/anxious.         Objective:   Physical Exam  Constitutional: He appears well-developed and well-nourished. No distress.       obese and well appearing   HENT:  Head: Normocephalic and atraumatic.  Right Ear: External ear normal.  Left Ear: External ear normal.  Nose: Nose normal.  Mouth/Throat: Oropharynx is clear and moist.  Eyes: Conjunctivae normal and EOM are normal. Pupils are equal, round, and reactive to light. Right eye exhibits no discharge. Left eye exhibits no discharge. No scleral icterus.  Neck: Normal range of motion. Neck supple. No JVD present. Carotid bruit is not present. No thyromegaly present.  Cardiovascular: Normal rate, regular rhythm, normal heart sounds and intact distal pulses.  Exam reveals no gallop.   Pulmonary/Chest: Effort normal and breath sounds normal. No respiratory distress. He has no wheezes. He exhibits no tenderness.  Abdominal: Soft. Bowel sounds are normal. He exhibits no distension, no abdominal bruit and no mass. There is no tenderness.  Musculoskeletal: He  exhibits no edema and no tenderness.       No cva tenderness   Lymphadenopathy:    He has no cervical adenopathy.  Neurological: He is alert. He has normal reflexes. No cranial nerve deficit. He  exhibits normal muscle tone. Coordination normal.  Skin: Skin is warm and dry. No rash noted. No erythema. No pallor.  Psychiatric: He has a normal mood and affect.          Assessment & Plan:

## 2012-08-18 NOTE — Assessment & Plan Note (Signed)
a1c today With good diet and exercise- expect improvement Disc imp of wt loss- will continue his efforts

## 2012-08-18 NOTE — Patient Instructions (Addendum)
Labs today Keep working on weight loss and also exercise  I'm glad you are doing well

## 2012-08-18 NOTE — Assessment & Plan Note (Signed)
bp in fair control at this time  No changes needed  Disc lifstyle change with low sodium diet and exercise   Labs today 

## 2012-08-19 LAB — COMPREHENSIVE METABOLIC PANEL
Albumin: 4.9 g/dL (ref 3.5–5.2)
BUN: 23 mg/dL (ref 6–23)
CO2: 29 mEq/L (ref 19–32)
Calcium: 9.9 mg/dL (ref 8.4–10.5)
Chloride: 98 mEq/L (ref 96–112)
GFR: 49.33 mL/min — ABNORMAL LOW (ref >60.00)
Glucose, Bld: 102 mg/dL — ABNORMAL HIGH (ref 70–99)
Potassium: 3.6 mEq/L (ref 3.5–5.1)

## 2012-08-19 LAB — LIPID PANEL
Cholesterol: 117 mg/dL (ref 0–200)
Total CHOL/HDL Ratio: 3

## 2012-08-19 LAB — CBC WITH DIFFERENTIAL/PLATELET
Basophils Relative: 0.6 % (ref 0.0–3.0)
Eosinophils Relative: 3.6 % (ref 0.0–5.0)
Hemoglobin: 15.5 g/dL (ref 13.0–17.0)
Lymphocytes Relative: 38.8 % (ref 12.0–46.0)
MCV: 91.8 fl (ref 78.0–100.0)
Neutro Abs: 4 10*3/uL (ref 1.4–7.7)
Neutrophils Relative %: 48.6 % (ref 43.0–77.0)
RBC: 5.02 Mil/uL (ref 4.22–5.81)
WBC: 8.3 10*3/uL (ref 4.5–10.5)

## 2012-08-19 LAB — TSH: TSH: 1.46 u[IU]/mL (ref 0.35–5.50)

## 2012-08-30 ENCOUNTER — Ambulatory Visit (INDEPENDENT_AMBULATORY_CARE_PROVIDER_SITE_OTHER): Payer: Medicare PPO | Admitting: Family Medicine

## 2012-08-30 ENCOUNTER — Encounter: Payer: Self-pay | Admitting: Family Medicine

## 2012-08-30 VITALS — BP 128/76 | HR 65 | Temp 97.8°F | Ht 71.0 in | Wt 236.2 lb

## 2012-08-30 DIAGNOSIS — E119 Type 2 diabetes mellitus without complications: Secondary | ICD-10-CM

## 2012-08-30 DIAGNOSIS — R799 Abnormal finding of blood chemistry, unspecified: Secondary | ICD-10-CM

## 2012-08-30 DIAGNOSIS — R7309 Other abnormal glucose: Secondary | ICD-10-CM

## 2012-08-30 DIAGNOSIS — E785 Hyperlipidemia, unspecified: Secondary | ICD-10-CM

## 2012-08-30 DIAGNOSIS — R7989 Other specified abnormal findings of blood chemistry: Secondary | ICD-10-CM

## 2012-08-30 DIAGNOSIS — N259 Disorder resulting from impaired renal tubular function, unspecified: Secondary | ICD-10-CM

## 2012-08-30 LAB — POCT URINALYSIS DIPSTICK
Bilirubin, UA: NEGATIVE
Glucose, UA: NEGATIVE
Ketones, UA: NEGATIVE
Leukocytes, UA: NEGATIVE
Protein, UA: NEGATIVE

## 2012-08-30 NOTE — Progress Notes (Signed)
Subjective:    Patient ID: Kurt Aguilar, male    DOB: Jun 07, 1946, 66 y.o.   MRN: [id number]  HPI Here for f/u of several chronic medical problem  Wt is up 6.5 lb He blames a lot of things in pockets  Weight at home has not changed   (he is trying hard to loose weight) Did eat some potato chips last night  bmi is 32  Hyperglycemia Lab Results  Component Value Date   HGBA1C 6.7* 08/18/2012   this is up from 6.1 Is now in diabetic range  No diabetes in the family    Lab Results  Component Value Date   CHOL 117 08/18/2012   CHOL 127 11/25/2010   CHOL 151 09/04/2010   Lab Results  Component Value Date   HDL 34.10* 08/18/2012   HDL 37.70* 11/25/2010   HDL 45.30 09/04/2010   Lab Results  Component Value Date   LDLCALC 72 09/04/2010   LDLCALC 91 07/19/2009   LDLCALC 85 04/17/2009   Lab Results  Component Value Date   TRIG 355.0* 08/18/2012   TRIG 241.0* 11/25/2010   TRIG 170.0* 09/04/2010   Lab Results  Component Value Date   CHOLHDL 3 08/18/2012   CHOLHDL 3 11/25/2010   CHOLHDL 3 09/04/2010   Lab Results  Component Value Date   LDLDIRECT 57.1 08/18/2012   LDLDIRECT 56.5 11/25/2010   LDLDIRECT 109.0 03/06/2010   trig are up - ? Due to sugar  ua neg today Need microalb Cr is up from 1.3 to 1.5 Hx of hypertensive nepheropathy On lasix and chlorthalidone Very rarely takes lasix -only when he travels  Does not drink enough water    Review of Systems Review of Systems  Constitutional: Negative for fever, appetite change, fatigue and unexpected weight change.  Eyes: Negative for pain and visual disturbance.  Respiratory: Negative for cough and shortness of breath.   Cardiovascular: Negative for cp or palpitations    Gastrointestinal: Negative for nausea, diarrhea and constipation.  Genitourinary: Negative for urgency and frequency.  Skin: Negative for pallor or rash   Neurological: Negative for weakness, light-headedness, numbness and headaches.   Hematological: Negative for adenopathy. Does not bruise/bleed easily.  Psychiatric/Behavioral: Negative for dysphoric mood. The patient is not nervous/anxious.         Objective:   Physical Exam  Constitutional: He appears well-developed and well-nourished. No distress.       obese and well appearing   HENT:  Head: Normocephalic and atraumatic.  Mouth/Throat: Oropharynx is clear and moist.  Eyes: Conjunctivae normal and EOM are normal. Pupils are equal, round, and reactive to light. Right eye exhibits no discharge. Left eye exhibits no discharge. No scleral icterus.  Neck: Normal range of motion. Neck supple. No JVD present. Carotid bruit is not present. No thyromegaly present.  Cardiovascular: Normal rate, regular rhythm, normal heart sounds and intact distal pulses.  Exam reveals no gallop.   Pulmonary/Chest: Effort normal and breath sounds normal. He has no rales.       No crackles   Abdominal: He exhibits no abdominal bruit.  Musculoskeletal: He exhibits no edema.  Lymphadenopathy:    He has no cervical adenopathy.  Neurological: He is alert. He has normal reflexes. No cranial nerve deficit. He exhibits normal muscle tone. Coordination normal.  Skin: Skin is warm and dry. No rash noted. No pallor.  Psychiatric: He has a normal mood and affect.          Assessment & Plan:

## 2012-08-30 NOTE — Assessment & Plan Note (Signed)
New dx with a1c of 6.7 Ref to DM ed Disc wt loss and low glycemic diet Good exericse profile Trig are high- this may be related Pend microalb -not on ace currently due to renla insuff

## 2012-08-30 NOTE — Assessment & Plan Note (Signed)
Hypertensive nephropathy in past  ua neg microalb pending -? Consider adding back ace in light of DM now  On chlorthalidone  Not enough water intake  Watching closely

## 2012-08-30 NOTE — Assessment & Plan Note (Signed)
Good control except for triglycerides which may be up due to sugar Disc goals for lipids and reasons to control them Rev labs with pt Rev low sat fat diet in detail

## 2012-08-30 NOTE — Patient Instructions (Addendum)
Drink lots of water for kidney health Work on low sugar and low fat diet and weight loss We will refer you for diabetic teaching at check out  We will get back to you when the urine microalbumin test returns  Follow up with me in about 3 months with labs prior

## 2012-08-31 LAB — MICROALBUMIN / CREATININE URINE RATIO: Microalb Creat Ratio: 1.7 mg/g (ref 0.0–30.0)

## 2012-09-01 LAB — POCT UA - MICROSCOPIC ONLY
Casts, Ur, LPF, POC: 0
Crystals, Ur, HPF, POC: 0
Yeast, UA: 0

## 2012-09-13 ENCOUNTER — Encounter: Payer: Self-pay | Admitting: Family Medicine

## 2012-09-13 ENCOUNTER — Ambulatory Visit (INDEPENDENT_AMBULATORY_CARE_PROVIDER_SITE_OTHER): Payer: Medicare PPO | Admitting: Family Medicine

## 2012-09-13 VITALS — BP 118/74 | HR 69 | Temp 98.2°F | Ht 71.0 in | Wt 227.8 lb

## 2012-09-13 DIAGNOSIS — J209 Acute bronchitis, unspecified: Secondary | ICD-10-CM

## 2012-09-13 MED ORDER — AZITHROMYCIN 250 MG PO TABS: ORAL_TABLET | ORAL | Status: DC

## 2012-09-13 MED ORDER — PREDNISONE 10 MG PO TABS: ORAL_TABLET | ORAL | Status: DC

## 2012-09-13 MED ORDER — ALBUTEROL SULFATE HFA 108 (90 BASE) MCG/ACT IN AERS: 2.0000 | INHALATION_SPRAY | RESPIRATORY_TRACT | Status: DC | PRN

## 2012-09-13 NOTE — Progress Notes (Signed)
Subjective:    Patient ID: Kurt Aguilar, male    DOB: 1946-03-23, 67 y.o.   MRN: [id number]  HPI Exposed to sick grandchildren and wife  Started symptoms about Wednesday afternoon last week  Has not had a fever yet Cough and a painful chest Has a sore throat  He usually gets bronchitis with reactive airways -and often ends up needing prednisone  Not wheezing yet  Yellow chest and nasal drainage -not thick  No facial pain  Ears are fine   Is behind his wife-she has bacterial bronchitis   Patient Active Problem List  Diagnosis  . HYPERLIPIDEMIA  . GOUT  . OBSTRUCTIVE SLEEP APNEA  . HYPERTENSION  . CAD  . GERD  . RENAL INSUFFICIENCY  . Diabetes type 2, controlled  . DIVERTICULITIS, HX OF  . Chronic low back pain  . Bronchitis   Past Medical History  Diagnosis Date  . Diverticulitis   . Gout   . Hyperlipidemia   . Hypertension   . Osteoporosis   . GERD (gastroesophageal reflux disease)   . Atypical migraine   . Hyperglycemia    Past Surgical History  Procedure Date  . Open heart surgery 09/09/1995-1998    5 bypass  . Tonsillectomy   . Repair knee ligament     right   History  Substance Use Topics  . Smoking status: Former Smoker -- 1.0 packs/day for 3 years    Types: Cigars    Quit date: 11/25/2004  . Smokeless tobacco: Not on file     Comment: occas. cigar quit 2006  . Alcohol Use: 0.5 oz/week    1 drink(s) per week     Comment: occas. social   Family History  Problem Relation Age of Onset  . Heart disease Mother   . Heart disease Father   . Stroke Father    Allergies  Allergen Reactions  . Oxycodone-Acetaminophen    Current Outpatient Prescriptions on File Prior to Visit  Medication Sig Dispense Refill  . acetaminophen (TYLENOL) 500 MG tablet Take 500 mg by mouth 2 (two) times daily.        Marland Kitchen albuterol (VENTOLIN HFA) 108 (90 BASE) MCG/ACT inhaler Inhale 2 puffs into the lungs every 4 (four) hours as needed for shortness of breath (tight lungs  ).  1 Inhaler  11  . allopurinol (ZYLOPRIM) 100 MG tablet Take 1 tablet (100 mg total) by mouth 2 (two) times daily.  180 tablet  0  . aspirin 81 MG tablet Take by mouth. Takes 2 tablets daily.      . cetirizine (ZYRTEC) 10 MG tablet Take 10 mg by mouth as needed.       . chlorthalidone (HYGROTON) 25 MG tablet Take 1 tablet (25 mg total) by mouth daily.  90 tablet  0  . ezetimibe (ZETIA) 10 MG tablet Take 1 tablet (10 mg total) by mouth daily.  90 tablet  0  . fish oil-omega-3 fatty acids 1000 MG capsule Take 1 g by mouth 2 (two) times daily.       . furosemide (LASIX) 20 MG tablet Take 1 tablet (20 mg total) by mouth daily as needed.  30 tablet  3  . Glucosamine-Chondroit-Vit C-Mn (GLUCOSAMINE 1500 COMPLEX) CAPS Take 1,500 capsules by mouth 2 (two) times daily.        . Lutein 20 MG CAPS Take 1 capsule by mouth daily.        . metoprolol tartrate (LOPRESSOR) 25 MG tablet Take 1 tablet (25  mg total) by mouth 2 (two) times daily.  180 tablet  0  . omeprazole (PRILOSEC) 20 MG capsule Take 1 capsule (20 mg total) by mouth 2 (two) times daily.  60 capsule  0  . potassium chloride SA (K-DUR,KLOR-CON) 20 MEQ tablet Take 1 tablet (20 mEq total) by mouth daily.  90 tablet  0  . predniSONE (DELTASONE) 10 MG tablet Take 3 pills by mouth daily for 3 days then 2 pills daily for 3 days then 1 pill daily for 3 days.  Take with food.  18 tablet  0  . rosuvastatin (CRESTOR) 40 MG tablet Take 1 tablet (40 mg total) by mouth daily.  90 tablet  0  . vardenafil (LEVITRA) 20 MG tablet Take 20 mg by mouth as needed.        . vitamin C (ASCORBIC ACID) 500 MG tablet Take 500 mg by mouth daily.          Review of Systems Review of Systems  Constitutional: Negative for fever, appetite change,  and unexpected weight change. pos for fatigue Eyes: Negative for pain and visual disturbance.  ENT pos for cong and post nasal drip and rhinorrhea, neg for sinus pain  Respiratory: Negative for shortness of breath.     Cardiovascular: Negative for cp or palpitations    Gastrointestinal: Negative for nausea, diarrhea and constipation.  Genitourinary: Negative for urgency and frequency.  Skin: Negative for pallor or rash   Neurological: Negative for weakness, light-headedness, numbness and headaches.  Hematological: Negative for adenopathy. Does not bruise/bleed easily.  Psychiatric/Behavioral: Negative for dysphoric mood. The patient is not nervous/anxious.         Objective:   Physical Exam  Constitutional: He appears well-developed and well-nourished. No distress.       overwt and well app  HENT:  Head: Normocephalic and atraumatic.  Right Ear: External ear normal.  Left Ear: External ear normal.  Mouth/Throat: Oropharynx is clear and moist. No oropharyngeal exudate.       Nares are injected and congested  Clear rhinorrhea No sinus tenderness  Eyes: Conjunctivae normal and EOM are normal. Pupils are equal, round, and reactive to light. Right eye exhibits no discharge. Left eye exhibits no discharge. No scleral icterus.  Neck: Normal range of motion. Neck supple. No JVD present. No thyromegaly present.  Cardiovascular: Normal rate and regular rhythm.   Pulmonary/Chest: Effort normal. No respiratory distress. He has wheezes. He has no rales. He exhibits no tenderness.       Harsh bs  Wheeze on forced exp only occ rhonchi  Lymphadenopathy:    He has no cervical adenopathy.  Neurological: He is alert.  Skin: Skin is warm and dry. No rash noted.  Psychiatric: He has a normal mood and affect.          Assessment & Plan:

## 2012-09-13 NOTE — Patient Instructions (Addendum)
Drink lots of fluids and get rest mucinex DM is ok for cough and congestion  Take the zpak as directed  Use the inhaler for wheeze as needed , but if wheezing gets worse and uncontrolled start the prednisone and expect this will increase your sugars so watch it  Update if not starting to improve in a week or if worsening

## 2012-09-14 ENCOUNTER — Other Ambulatory Visit: Payer: Self-pay | Admitting: Family Medicine

## 2012-09-14 MED ORDER — OMEPRAZOLE 20 MG PO CPDR: 20.0000 mg | DELAYED_RELEASE_CAPSULE | Freq: Two times a day (BID) | ORAL | Status: DC

## 2012-09-14 MED ORDER — EZETIMIBE 10 MG PO TABS: 10.0000 mg | ORAL_TABLET | Freq: Every day | ORAL | Status: DC

## 2012-09-14 MED ORDER — ALLOPURINOL 100 MG PO TABS: 100.0000 mg | ORAL_TABLET | Freq: Two times a day (BID) | ORAL | Status: DC

## 2012-09-14 MED ORDER — POTASSIUM CHLORIDE CRYS ER 20 MEQ PO TBCR: 20.0000 meq | EXTENDED_RELEASE_TABLET | Freq: Every day | ORAL | Status: DC

## 2012-09-14 MED ORDER — METOPROLOL TARTRATE 25 MG PO TABS: 25.0000 mg | ORAL_TABLET | Freq: Two times a day (BID) | ORAL | Status: DC

## 2012-09-14 MED ORDER — CHLORTHALIDONE 25 MG PO TABS: 25.0000 mg | ORAL_TABLET | Freq: Every day | ORAL | Status: DC

## 2012-09-14 MED ORDER — ROSUVASTATIN CALCIUM 40 MG PO TABS: 40.0000 mg | ORAL_TABLET | Freq: Every day | ORAL | Status: DC

## 2012-09-16 ENCOUNTER — Telehealth: Payer: Self-pay

## 2012-09-16 NOTE — Telephone Encounter (Signed)
Pt request multiple refill status to [facility]. Advised pt several meds were refilled electronically to [facility] on 09/14/12. Pt will ck with [facility].

## 2012-09-20 ENCOUNTER — Ambulatory Visit: Payer: Self-pay | Admitting: Family Medicine

## 2012-09-21 ENCOUNTER — Telehealth: Payer: Self-pay | Admitting: Family Medicine

## 2012-09-21 MED ORDER — PRODIGY LANCETS 26G MISC: 1.0000 | Freq: Every day | Status: DC

## 2012-09-21 MED ORDER — GLUCOSE BLOOD VI STRP: ORAL_STRIP | Status: DC

## 2012-09-21 NOTE — Telephone Encounter (Signed)
Thanks- that sounds good

## 2012-09-21 NOTE — Telephone Encounter (Signed)
Spoke with pt an he did need lancets too, Rx sent to pharm. electronically and pt notified

## 2012-09-21 NOTE — Telephone Encounter (Signed)
Called Mail order pharm. and they no longer supply strips for one touch they supply for [facility] true results, accucheck aviva and accucheck nano, and prodigy, called and let pt know and he said that he is going to talk to his DM coach and call us back he is either going to have the strips for his one touch meter sent to a local pharm. or change his meter to one that his mail order pharm. covers

## 2012-09-21 NOTE — Telephone Encounter (Signed)
Pt needs DM test equipt px sent to [facility]   He needs one touch ultra blue test strips to check glucose qd and prn for DM (250.0) # 90 3 refills  Also one touch delica lancets- same instructions #90 3 refills   Please call these in as they are not in the data base to send electronically and add to med list if possible , thanks

## 2012-09-21 NOTE — Addendum Note (Signed)
Addended by: Loura Pardon A on: 09/21/2012 03:20 PM   Modules accepted: Orders

## 2012-09-21 NOTE — Telephone Encounter (Signed)
I sent strips to [facility]- if he needs lancets- please send those also (whatever brand he needs) Thanks

## 2012-09-21 NOTE — Addendum Note (Signed)
Addended by: Tammi Sou on: 09/21/2012 03:43 PM   Modules accepted: Orders

## 2012-09-21 NOTE — Telephone Encounter (Addendum)
Pt called back and request the prodigy test strips for the prodigy pocket meter sent to [facility].. Pt does not need rx for meter.

## 2012-10-04 ENCOUNTER — Ambulatory Visit (INDEPENDENT_AMBULATORY_CARE_PROVIDER_SITE_OTHER): Payer: Medicare PPO | Admitting: Internal Medicine

## 2012-10-04 ENCOUNTER — Encounter: Payer: Self-pay | Admitting: Internal Medicine

## 2012-10-04 VITALS — BP 122/74 | HR 56 | Ht 71.0 in | Wt 226.2 lb

## 2012-10-04 DIAGNOSIS — G4733 Obstructive sleep apnea (adult) (pediatric): Secondary | ICD-10-CM

## 2012-10-04 NOTE — Progress Notes (Signed)
10/03/11- 11 yoM former smoker followed for OSA Wife here LOV 10/01/10 She returns for one year followup, still fully compliant with CPAP 11/[facility]. He says he sleeps well through the night. He likes his current fullface mask and humidifier. His wife notes only minimal snoring without witnessed apnea. He feels well rested and sees no need for any changes.  10/04/12-67 yoM former smoker followed for OSA           Wife here FOLLOWS FOR: wears CPAP 13/ [facility] every night for about 6 hours; pressure working well for patient at this. Full face mask. We had changed pressure from 11 in July, 2013, due to a leak. Now diagnosed with diabetes, diet-controlled, trying to lose weight. Weight loss would help sleep apnea as explained again.  ROS-see HPI Constitutional:   No-   weight loss, night sweats, fevers, chills, fatigue, lassitude. HEENT:   No-  headaches, difficulty swallowing, tooth/dental problems, sore throat,       No-  sneezing, itching, ear ache, nasal congestion, post nasal drip,  CV:  No-   chest pain, orthopnea, PND, swelling in lower extremities, anasarca, dizziness, palpitations Resp: No-   shortness of breath with exertion or at rest.              No-   productive cough,  No non-productive cough,  No- coughing up of blood.              No-   change in color of mucus.  No- wheezing.   Skin: No-   rash or lesions. GI:  No-   heartburn, indigestion, abdominal pain, nausea, vomiting,  GU:  MS:  No-   joint pain or swelling. . Neuro-     nothing unusual Psych:  No- change in mood or affect. No depression or anxiety.  No memory loss.   OBJ General- Alert, Oriented, Affect-appropriate, Distress- none acute Skin- rash-none, lesions- none, excoriation- none Lymphadenopathy- none Head- atraumatic            Eyes- Gross vision intact, PERRLA, conjunctivae clear secretions            Ears- Hearing, canals-normal            Nose- Clear, no-Septal dev, mucus, polyps, erosion, perforation     Throat- Mallampati II , mucosa clear , drainage- none, tonsils- atrophic Neck- flexible , trachea midline, no stridor , thyroid nl, carotid no bruit Chest - symmetrical excursion , unlabored           Heart/CV- RRR , no murmur , no gallop  , no rub, nl s1 s2                           - JVD- none , edema- none, stasis changes- none, varices- none           Lung- clear to P&A, wheeze- none, cough- none , dullness-none, rub- none           Chest wall-  Abd- Br/ Gen/ Rectal- Not done, not indicated Extrem- cyanosis- none, clubbing, none, atrophy- none, strength- nl Neuro- grossly intact to observation

## 2012-10-04 NOTE — Patient Instructions (Addendum)
We can continue CPAP 13/ [facility]  Please call as needed

## 2012-10-09 ENCOUNTER — Ambulatory Visit: Payer: Self-pay | Admitting: Family Medicine

## 2012-10-13 NOTE — Assessment & Plan Note (Signed)
Good compliance and control. We could leave CPAP pressure at 13. Encouraged weight loss.

## 2012-11-10 ENCOUNTER — Ambulatory Visit: Payer: Self-pay | Admitting: Family Medicine

## 2012-11-18 ENCOUNTER — Other Ambulatory Visit: Payer: Self-pay | Admitting: *Deleted

## 2012-11-18 MED ORDER — METOPROLOL TARTRATE 25 MG PO TABS: 25.0000 mg | ORAL_TABLET | Freq: Two times a day (BID) | ORAL | Status: DC

## 2012-11-18 MED ORDER — OMEPRAZOLE 20 MG PO CPDR: 20.0000 mg | DELAYED_RELEASE_CAPSULE | Freq: Two times a day (BID) | ORAL | Status: DC

## 2012-11-18 MED ORDER — CHLORTHALIDONE 25 MG PO TABS: 25.0000 mg | ORAL_TABLET | Freq: Every day | ORAL | Status: DC

## 2012-11-18 MED ORDER — POTASSIUM CHLORIDE CRYS ER 20 MEQ PO TBCR: 20.0000 meq | EXTENDED_RELEASE_TABLET | Freq: Every day | ORAL | Status: DC

## 2012-11-18 MED ORDER — EZETIMIBE 10 MG PO TABS: 10.0000 mg | ORAL_TABLET | Freq: Every day | ORAL | Status: DC

## 2012-11-18 MED ORDER — ALLOPURINOL 100 MG PO TABS: 100.0000 mg | ORAL_TABLET | Freq: Two times a day (BID) | ORAL | Status: DC

## 2012-11-21 ENCOUNTER — Telehealth: Payer: Self-pay | Admitting: Family Medicine

## 2012-11-21 DIAGNOSIS — E785 Hyperlipidemia, unspecified: Secondary | ICD-10-CM

## 2012-11-21 DIAGNOSIS — N259 Disorder resulting from impaired renal tubular function, unspecified: Secondary | ICD-10-CM

## 2012-11-21 DIAGNOSIS — E119 Type 2 diabetes mellitus without complications: Secondary | ICD-10-CM

## 2012-11-21 DIAGNOSIS — I10 Essential (primary) hypertension: Secondary | ICD-10-CM

## 2012-11-21 NOTE — Telephone Encounter (Signed)
Message copied by [name] on Sun Nov 21, 2012  6:04 PM ------      Message from: [name]      Created: Tue Nov 16, 2012  1:20 PM      Regarding: F/u labs scheduled Mon 11/22/12       Please order  future f/u labs for pt's upcomming lab appt.      Thanks      Tasha             ------

## 2012-11-22 ENCOUNTER — Other Ambulatory Visit: Payer: Medicare PPO

## 2012-11-23 ENCOUNTER — Telehealth: Payer: Self-pay | Admitting: Family Medicine

## 2012-11-23 NOTE — Telephone Encounter (Signed)
Labs were already ordered

## 2012-11-23 NOTE — Telephone Encounter (Signed)
Message copied by [name] on Tue Nov 23, 2012  7:46 AM ------      Message from: Ellamae Sia      Created: Fri Nov 19, 2012 10:05 AM      Regarding: Lab orders for Wednesday, 3.19.14       F/U labs ------

## 2012-11-24 ENCOUNTER — Other Ambulatory Visit (INDEPENDENT_AMBULATORY_CARE_PROVIDER_SITE_OTHER): Payer: Medicare PPO

## 2012-11-24 DIAGNOSIS — E119 Type 2 diabetes mellitus without complications: Secondary | ICD-10-CM

## 2012-11-24 DIAGNOSIS — N259 Disorder resulting from impaired renal tubular function, unspecified: Secondary | ICD-10-CM

## 2012-11-24 DIAGNOSIS — E785 Hyperlipidemia, unspecified: Secondary | ICD-10-CM

## 2012-11-24 DIAGNOSIS — I10 Essential (primary) hypertension: Secondary | ICD-10-CM

## 2012-11-24 LAB — COMPREHENSIVE METABOLIC PANEL
ALT: 21 U/L (ref 0–53)
AST: 25 U/L (ref 0–37)
BUN: 29 mg/dL — ABNORMAL HIGH (ref 6–23)
Calcium: 9.8 mg/dL (ref 8.4–10.5)
Chloride: 101 mEq/L (ref 96–112)
Creatinine, Ser: 1.4 mg/dL (ref 0.4–1.5)
Total Bilirubin: 1 mg/dL (ref 0.3–1.2)

## 2012-11-24 LAB — LIPID PANEL
Cholesterol: 120 mg/dL (ref 0–200)
HDL: 37 mg/dL — ABNORMAL LOW (ref >39.00)
LDL Cholesterol: 53 mg/dL (ref 0–99)
Triglycerides: 149 mg/dL (ref 0.0–149.0)

## 2012-11-29 ENCOUNTER — Encounter: Payer: Self-pay | Admitting: Family Medicine

## 2012-11-29 ENCOUNTER — Ambulatory Visit (INDEPENDENT_AMBULATORY_CARE_PROVIDER_SITE_OTHER): Payer: Medicare PPO | Admitting: Family Medicine

## 2012-11-29 VITALS — BP 110/64 | HR 66 | Temp 98.4°F | Ht 71.0 in | Wt 220.0 lb

## 2012-11-29 DIAGNOSIS — I10 Essential (primary) hypertension: Secondary | ICD-10-CM

## 2012-11-29 DIAGNOSIS — N259 Disorder resulting from impaired renal tubular function, unspecified: Secondary | ICD-10-CM

## 2012-11-29 DIAGNOSIS — E119 Type 2 diabetes mellitus without complications: Secondary | ICD-10-CM

## 2012-11-29 NOTE — Assessment & Plan Note (Signed)
In pt with hx of hypertensive nephropathy bp in fair control at this time  No changes needed  Disc lifstyle change with low sodium diet and exercise   Cr is stable at 1.4

## 2012-11-29 NOTE — Progress Notes (Signed)
Subjective:    Patient ID: Kurt Aguilar, male    DOB: 1946/05/03, 67 y.o.   MRN: [id number]  HPI Here for f/u of several chronic medical problems incl HTN and DM  Wt is down 7.5 lb since jan and 16 lb since dec! Is trying to stay on DM diet - low carbs , is a big effort -and is getting some exercise  Went to DM ed  Lab Results  Component Value Date   HGBA1C 6.1 11/24/2012   this is down from 6.7 (without med ) Watching his glucose    Triglycerides are much imp Lab Results  Component Value Date   CHOL 120 11/24/2012   CHOL 117 08/18/2012   CHOL 127 11/25/2010   Lab Results  Component Value Date   HDL 37.00* 11/24/2012   HDL 34.10* 08/18/2012   HDL 37.70* 11/25/2010   Lab Results  Component Value Date   LDLCALC 53 11/24/2012   LDLCALC 72 09/04/2010   LDLCALC 91 07/19/2009   Lab Results  Component Value Date   TRIG 149.0 11/24/2012   TRIG 355.0* 08/18/2012   TRIG 241.0* 11/25/2010   Lab Results  Component Value Date   CHOLHDL 3 11/24/2012   CHOLHDL 3 08/18/2012   CHOLHDL 3 11/25/2010   Lab Results  Component Value Date   LDLDIRECT 57.1 08/18/2012   LDLDIRECT 56.5 11/25/2010   LDLDIRECT 109.0 03/06/2010     bp is stable today  No cp or palpitations or headaches or edema  No side effects to medicines  BP Readings from Last 3 Encounters:  11/29/12 110/64  10/04/12 122/74  09/13/12 118/74    Renal insuff - fairly stable (hx of hypertensive nephropathy) Cr is 1.4 down from 1.5  Lots and lots of stress - this affects him also  Patient Active Problem List  Diagnosis  . HYPERLIPIDEMIA  . GOUT  . OBSTRUCTIVE SLEEP APNEA  . HYPERTENSION  . CAD  . GERD  . RENAL INSUFFICIENCY  . Diabetes type 2, controlled  . DIVERTICULITIS, HX OF  . Chronic low back pain   Past Medical History  Diagnosis Date  . Diverticulitis   . Gout   . Hyperlipidemia   . Hypertension   . Osteoporosis   . GERD (gastroesophageal reflux disease)   . Atypical migraine   .  Hyperglycemia    Past Surgical History  Procedure Laterality Date  . Open heart surgery  09/09/1995-1998    5 bypass  . Tonsillectomy    . Repair knee ligament      right   History  Substance Use Topics  . Smoking status: Former Smoker -- 1.00 packs/day for 3 years    Types: Cigars    Quit date: 11/25/2004  . Smokeless tobacco: Not on file     Comment: occas. cigar quit 2006  . Alcohol Use: 0.5 oz/week    1 drink(s) per week     Comment: occas. social   Family History  Problem Relation Age of Onset  . Heart disease Mother   . Heart disease Father   . Stroke Father    Allergies  Allergen Reactions  . Oxycodone-Acetaminophen    Current Outpatient Prescriptions on File Prior to Visit  Medication Sig Dispense Refill  . acetaminophen (TYLENOL) 500 MG tablet Take 500 mg by mouth 2 (two) times daily.        Marland Kitchen albuterol (VENTOLIN HFA) 108 (90 BASE) MCG/ACT inhaler Inhale 2 puffs into the lungs every 4 (four) hours as needed  for wheezing or shortness of breath (tight lungs ).  1 Inhaler  3  . allopurinol (ZYLOPRIM) 100 MG tablet Take 1 tablet (100 mg total) by mouth 2 (two) times daily.  180 tablet  1  . aspirin 81 MG tablet Takes 2 tablets daily.      . cetirizine (ZYRTEC) 10 MG tablet Take 10 mg by mouth as needed.       . chlorthalidone (HYGROTON) 25 MG tablet Take 1 tablet (25 mg total) by mouth daily.  90 tablet  1  . ezetimibe (ZETIA) 10 MG tablet Take 1 tablet (10 mg total) by mouth daily.  90 tablet  1  . fish oil-omega-3 fatty acids 1000 MG capsule Take 1 g by mouth 2 (two) times daily.       . furosemide (LASIX) 20 MG tablet Take 1 tablet (20 mg total) by mouth daily as needed.  30 tablet  3  . Glucosamine HCl 1500 MG TABS Take 1 tablet by mouth 2 (two) times daily.      Marland Kitchen glucose blood (PRODIGY TEST) test strip Check blood glucose once daily and as needed for DM2 (250.00)  90 each  3  . glucose blood test strip 1 each by Other route. one touch ultra blue test strips to  check glucose qd and prn      . Lutein 20 MG CAPS Take 1 capsule by mouth daily.        . metoprolol tartrate (LOPRESSOR) 25 MG tablet Take 1 tablet (25 mg total) by mouth 2 (two) times daily.  180 tablet  1  . omeprazole (PRILOSEC) 20 MG capsule Take 1 capsule (20 mg total) by mouth 2 (two) times daily.  180 capsule  1  . polycarbophil (FIBERCON) 625 MG tablet Take 625 mg by mouth daily.      . potassium chloride SA (K-DUR,KLOR-CON) 20 MEQ tablet Take 1 tablet (20 mEq total) by mouth daily.  90 tablet  1  . PRODIGY LANCETS 26G MISC 1 each by Does not apply route daily. To check glucose qd and prn for DM2 (250.00)  100 each  3  . rosuvastatin (CRESTOR) 40 MG tablet Take 1 tablet (40 mg total) by mouth daily.  90 tablet  0  . vardenafil (LEVITRA) 20 MG tablet Take 20 mg by mouth as needed.        . vitamin C (ASCORBIC ACID) 500 MG tablet Take 500 mg by mouth daily.       No current facility-administered medications on file prior to visit.    Review of Systems Review of Systems  Constitutional: Negative for fever, appetite change, fatigue and unexpected weight change.  Eyes: Negative for pain and visual disturbance.  Respiratory: Negative for cough and shortness of breath.   Cardiovascular: Negative for cp or palpitations    Gastrointestinal: Negative for nausea, diarrhea and constipation.  Genitourinary: Negative for urgency and frequency.  Skin: Negative for pallor or rash   Neurological: Negative for weakness, light-headedness, numbness and headaches.  Hematological: Negative for adenopathy. Does not bruise/bleed easily.  Psychiatric/Behavioral: Negative for dysphoric mood. The patient is not nervous/anxious.  pos for stressors        Objective:   Physical Exam  Constitutional: He appears well-developed and well-nourished. No distress.  HENT:  Head: Normocephalic and atraumatic.  Mouth/Throat: Oropharynx is clear and moist.  Eyes: Conjunctivae and EOM are normal. Pupils are equal,  round, and reactive to light. Right eye exhibits no discharge. Left eye exhibits  no discharge.  Neck: Normal range of motion. Neck supple. No JVD present. Carotid bruit is not present. No thyromegaly present.  Cardiovascular: Normal rate, regular rhythm, normal heart sounds and intact distal pulses.  Exam reveals no gallop.   Pulmonary/Chest: Effort normal and breath sounds normal. No respiratory distress. He has no wheezes. He has no rales.  Abdominal: Soft. Bowel sounds are normal. He exhibits no distension, no abdominal bruit and no mass. There is no tenderness.  Musculoskeletal: He exhibits no edema.  Lymphadenopathy:    He has no cervical adenopathy.  Neurological: He is alert. He has normal reflexes. No cranial nerve deficit. He exhibits normal muscle tone. Coordination normal.  Skin: Skin is warm and dry. No rash noted. No erythema. No pallor.  Psychiatric: He has a normal mood and affect.          Assessment & Plan:

## 2012-11-29 NOTE — Assessment & Plan Note (Signed)
a1c down to 6.1 with diet/ exercise and 20 lb wt loss Commended! On great effort- will keep it up  F/u 6 mo with lab prior

## 2012-11-29 NOTE — Assessment & Plan Note (Signed)
Cr is stable at 1.4 from hypertensive nephropathy Drinking more water Also bp is good and sugar is better  Will keep working on lifestyle change F/u in 6 mo

## 2012-11-29 NOTE — Patient Instructions (Addendum)
Your labs look a lot better  Keep up the great work with weight loss and diet and exercise  Follow up in 6 months with labs prior  Stay motivated !

## 2012-12-07 ENCOUNTER — Ambulatory Visit: Payer: Self-pay | Admitting: Family Medicine

## 2012-12-14 ENCOUNTER — Telehealth: Payer: Self-pay

## 2012-12-14 NOTE — Telephone Encounter (Signed)
Please review labs from Dr. Glori Bickers and advise [phone] thanks

## 2012-12-14 NOTE — Telephone Encounter (Signed)
Pt states he had bloodwork from Dr. Glori Bickers and states that Dr. [name] says he may be able to cut back on some of his medications. He also states he has lost weight. Please advise

## 2012-12-15 NOTE — Telephone Encounter (Signed)
Cholesterol  has been in the same range for about 2 years, denies having low  If he would like to do 1/2 zetia daily, this may help with the cost of the medication

## 2012-12-16 ENCOUNTER — Other Ambulatory Visit: Payer: Self-pay

## 2012-12-16 MED ORDER — EZETIMIBE 10 MG PO TABS: 5.0000 mg | ORAL_TABLET | Freq: Every day | ORAL | Status: DC

## 2012-12-16 NOTE — Telephone Encounter (Signed)
Pt informed Understanding verb 

## 2013-02-02 ENCOUNTER — Other Ambulatory Visit: Payer: Self-pay | Admitting: *Deleted

## 2013-02-02 MED ORDER — ROSUVASTATIN CALCIUM 40 MG PO TABS: 40.0000 mg | ORAL_TABLET | Freq: Every day | ORAL | Status: DC

## 2013-02-17 ENCOUNTER — Ambulatory Visit: Payer: Medicare PPO | Admitting: Cardiovascular Disease

## 2013-02-22 ENCOUNTER — Ambulatory Visit (INDEPENDENT_AMBULATORY_CARE_PROVIDER_SITE_OTHER): Payer: Medicare PPO | Admitting: Cardiovascular Disease

## 2013-02-22 ENCOUNTER — Encounter: Payer: Self-pay | Admitting: Cardiovascular Disease

## 2013-02-22 VITALS — BP 110/60 | HR 78 | Ht 70.0 in | Wt 217.5 lb

## 2013-02-22 DIAGNOSIS — E785 Hyperlipidemia, unspecified: Secondary | ICD-10-CM

## 2013-02-22 DIAGNOSIS — I4510 Unspecified right bundle-branch block: Secondary | ICD-10-CM | POA: Insufficient documentation

## 2013-02-22 DIAGNOSIS — I10 Essential (primary) hypertension: Secondary | ICD-10-CM

## 2013-02-22 DIAGNOSIS — E119 Type 2 diabetes mellitus without complications: Secondary | ICD-10-CM

## 2013-02-22 DIAGNOSIS — I2510 Atherosclerotic heart disease of native coronary artery without angina pectoris: Secondary | ICD-10-CM

## 2013-02-22 NOTE — Assessment & Plan Note (Signed)
The right bundle branch block compared to prior EKG. He is asymptomatic here no symptoms concerning for angina. We'll monitor him for now.

## 2013-02-22 NOTE — Assessment & Plan Note (Signed)
Cholesterol is at goal on the current lipid regimen. No changes to the medications were made.  

## 2013-02-22 NOTE — Patient Instructions (Addendum)
You are doing well. No medication changes were made.  Please call us if you have new issues that need to be addressed before your next appt.  Your physician wants you to follow-up in: 6 months.  You will receive a reminder letter in the mail two months in advance. If you don't receive a letter, please call our office to schedule the follow-up appointment.   

## 2013-02-22 NOTE — Assessment & Plan Note (Signed)
We have encouraged continued exercise, careful diet management. Hemoglobin A1c well controlled at 6.1

## 2013-02-22 NOTE — Assessment & Plan Note (Signed)
Currently with no symptoms of angina. No further workup at this time. Continue current medication regimen. New change on his EKG, right bundle branch block. Given no significant symptoms, we will hold off on any further testing.

## 2013-02-22 NOTE — Assessment & Plan Note (Signed)
Blood pressure is well controlled on today's visit. No changes made to the medications. 

## 2013-02-22 NOTE — Progress Notes (Signed)
Patient ID: Kurt Aguilar, male    DOB: 1945/10/22, 67 y.o.   MRN: [id number]  HPI Comments: 67 yo with history of CAD CABG back in 83 in [place], [place], HTN, hyperlipidemia, presents for routine followup.  Since his last clinic visit, he has had no health changes. No new symptoms.  He traveled to [place] last year with no complications. He is planning to travel again back to [place] later this year. He denies any chest pain, shortness of breath, edema, lightheadedness.  Is tolerating his medications well.  stress test in March of 2011 shows mildly decreased perfusion in the anterior wall at rest with moderately decreased perfusion with stress. He had no significant symptoms with testing.  EKG shows normal sinus rhythm with rate 78 beats per minute, no significant ST or T wave changes, new right bundle branch block (possibly had incomplete right bundle branch block last year)    Past Medical History: Last updated: 05/14/2010 1.  CAD s/p CABG 1997 x 5.  Done in [place], [place].  Myoview in 2006 in ? [place] was normal per patient.  2.  Diverticulitis, hx of 3.  Gout 4.  Hyperlipidemia 5.  Hypertension 6.  osteoarthritis: knee OA -- injections in past / cortisone  7.  GERD  8.  atypical migraine - only gets aura  9.  hyperglycemia -- borderline DM  10.  CKD 11. Obstructive sleep apnea- NPSG 04/17/10- Moderate OSA AHI 36.3/hr,,,,CPAP 2. Cough- PFT 03/18/10- Mild obstructive airways dz w/o resp to BD. FEV1 2.65/ 86%; Ratio 0.77   Outpatient Encounter Prescriptions as of 02/22/2013  Medication Sig Dispense Refill  . acetaminophen (TYLENOL) 500 MG tablet Take 500 mg by mouth 2 (two) times daily.        Marland Kitchen albuterol (VENTOLIN HFA) 108 (90 BASE) MCG/ACT inhaler Inhale 2 puffs into the lungs every 4 (four) hours as needed for wheezing or shortness of breath (tight lungs ).  1 Inhaler  3  . allopurinol (ZYLOPRIM) 100 MG tablet Take 100 mg by mouth every other day.      Marland Kitchen aspirin 81 MG tablet  Takes 2 tablets daily.      . cetirizine (ZYRTEC) 10 MG tablet Take 10 mg by mouth as needed.       . chlorthalidone (HYGROTON) 25 MG tablet Take 1 tablet (25 mg total) by mouth daily.  90 tablet  1  . ezetimibe (ZETIA) 10 MG tablet Take 0.5 tablets (5 mg total) by mouth daily.  90 tablet  1  . fish oil-omega-3 fatty acids 1000 MG capsule Take 1 g by mouth 2 (two) times daily.       . furosemide (LASIX) 20 MG tablet Take 1 tablet (20 mg total) by mouth daily as needed.  30 tablet  3  . Glucosamine HCl 1500 MG TABS Take 1 tablet by mouth 2 (two) times daily.      Marland Kitchen glucose blood (PRODIGY TEST) test strip Check blood glucose once daily and as needed for DM2 (250.00)  90 each  3  . glucose blood test strip 1 each by Other route. one touch ultra blue test strips to check glucose qd and prn      . Lutein 20 MG CAPS Take 1 capsule by mouth daily.        . metoprolol tartrate (LOPRESSOR) 25 MG tablet Take 1 tablet (25 mg total) by mouth 2 (two) times daily.  180 tablet  1  . omeprazole (PRILOSEC) 20 MG capsule Take  1 capsule (20 mg total) by mouth 2 (two) times daily.  180 capsule  1  . polycarbophil (FIBERCON) 625 MG tablet Take 625 mg by mouth daily.      . potassium chloride SA (K-DUR,KLOR-CON) 20 MEQ tablet Take 1 tablet (20 mEq total) by mouth daily.  90 tablet  1  . PRODIGY LANCETS 26G MISC 1 each by Does not apply route daily. To check glucose qd and prn for DM2 (250.00)  100 each  3  . rosuvastatin (CRESTOR) 40 MG tablet Take 1 tablet (40 mg total) by mouth daily.  90 tablet  1  . vardenafil (LEVITRA) 20 MG tablet Take 20 mg by mouth as needed.        . vitamin C (ASCORBIC ACID) 500 MG tablet Take 500 mg by mouth daily.      . [DISCONTINUED] allopurinol (ZYLOPRIM) 100 MG tablet Take 1 tablet (100 mg total) by mouth 2 (two) times daily.  180 tablet  1    Review of Systems  Constitutional: Negative.   HENT: Negative.   Eyes: Negative.   Respiratory: Negative.   Cardiovascular: Negative.    Gastrointestinal: Negative.   Musculoskeletal: Negative.   Skin: Negative.   Neurological: Negative.        Some dizzy symptoms with standing too quickly  Psychiatric/Behavioral: Negative.   All other systems reviewed and are negative.   BP 110/60  Pulse 78  Ht 5\' 10"  (1.778 m)  Wt 217 lb 8 oz (98.657 kg)  BMI 31.21 kg/m2  Physical Exam  Nursing note and vitals reviewed. Constitutional: He is oriented to person, place, and time. He appears well-developed and well-nourished.  HENT:  Head: Normocephalic.  Nose: Nose normal.  Mouth/Throat: Oropharynx is clear and moist.  Eyes: Conjunctivae are normal. Pupils are equal, round, and reactive to light.  Neck: Normal range of motion. Neck supple. No JVD present.  Cardiovascular: Normal rate, regular rhythm, S1 normal, S2 normal, normal heart sounds and intact distal pulses.  Exam reveals no gallop and no friction rub.   No murmur heard. Pulmonary/Chest: Effort normal and breath sounds normal. No respiratory distress. He has no wheezes. He has no rales. He exhibits no tenderness.  Abdominal: Soft. Bowel sounds are normal. He exhibits no distension. There is no tenderness.  Musculoskeletal: Normal range of motion. He exhibits no edema and no tenderness.  Lymphadenopathy:    He has no cervical adenopathy.  Neurological: He is alert and oriented to person, place, and time. Coordination normal.  Skin: Skin is warm and dry. No rash noted. No erythema.  Psychiatric: He has a normal mood and affect. His behavior is normal. Judgment and thought content normal.      Assessment and Plan

## 2013-04-11 ENCOUNTER — Other Ambulatory Visit: Payer: Self-pay | Admitting: *Deleted

## 2013-04-11 MED ORDER — CHLORTHALIDONE 25 MG PO TABS: 25.0000 mg | ORAL_TABLET | Freq: Every day | ORAL | Status: DC

## 2013-04-11 MED ORDER — OMEPRAZOLE 20 MG PO CPDR: 20.0000 mg | DELAYED_RELEASE_CAPSULE | Freq: Two times a day (BID) | ORAL | Status: DC

## 2013-04-11 MED ORDER — GLUCOSE BLOOD VI STRP: ORAL_STRIP | Status: DC

## 2013-04-11 MED ORDER — POTASSIUM CHLORIDE CRYS ER 20 MEQ PO TBCR: 20.0000 meq | EXTENDED_RELEASE_TABLET | Freq: Every day | ORAL | Status: DC

## 2013-05-23 ENCOUNTER — Other Ambulatory Visit: Payer: Medicare PPO

## 2013-05-30 ENCOUNTER — Encounter: Payer: Medicare PPO | Admitting: Family Medicine

## 2013-06-09 ENCOUNTER — Encounter: Payer: Self-pay | Admitting: Internal Medicine

## 2013-06-09 ENCOUNTER — Ambulatory Visit (INDEPENDENT_AMBULATORY_CARE_PROVIDER_SITE_OTHER): Payer: Medicare PPO | Admitting: Internal Medicine

## 2013-06-09 VITALS — BP 126/80 | HR 83 | Temp 98.6°F | Ht 71.0 in | Wt 218.5 lb

## 2013-06-09 DIAGNOSIS — J209 Acute bronchitis, unspecified: Secondary | ICD-10-CM

## 2013-06-09 MED ORDER — AZITHROMYCIN 250 MG PO TABS: ORAL_TABLET | ORAL | Status: DC

## 2013-06-09 NOTE — Patient Instructions (Signed)

## 2013-06-09 NOTE — Progress Notes (Signed)
HPI  Pt presents to the clinic today with c/o cold symptoms x 2 weeks. The worst part is the sore throat cough. He does produce thick green sputum. He denies fever but has chest congestion and body aches. He has tried Robitussin, Mucinex, cough drops and nothing seems to help. The cough is worse at night. He does not have a history of allergies or no asthma. He does have sick contacts.  Review of Systems      Past Medical History  Diagnosis Date  . Diverticulitis   . Gout   . Hyperlipidemia   . Hypertension   . Osteoporosis   . GERD (gastroesophageal reflux disease)   . Atypical migraine   . Hyperglycemia     Family History  Problem Relation Age of Onset  . Heart disease Mother   . Heart disease Father   . Stroke Father     History   Social History  . Marital Status: Married    Spouse Name: N/A    Number of Children: 0  . Years of Education: N/A   Occupational History  . Restaurant owner    Social History Main Topics  . Smoking status: Former Smoker -- 1.00 packs/day for 3 years    Types: Cigars    Quit date: 11/25/2004  . Smokeless tobacco: Not on file     Comment: occas. cigar quit 2006  . Alcohol Use: 0.5 oz/week    1 drink(s) per week     Comment: occas. social  . Drug Use: No  . Sexual Activity: Not on file   Other Topics Concern  . Not on file   Social History Narrative  . No narrative on file    Allergies  Allergen Reactions  . Oxycodone-Acetaminophen      Constitutional: Positive headache, fatigue. Denies fever or abrupt weight changes.  HEENT:  Positive sore throat. Denies eye redness, eye pain, pressure behind the eyes, facial pain, nasal congestion, ear pain, ringing in the ears, wax buildup, runny nose or bloody nose. Respiratory: Positive cough. Denies difficulty breathing or shortness of breath.  Cardiovascular: Denies chest pain, chest tightness, palpitations or swelling in the hands or feet.   No other specific complaints in a  complete review of systems (except as listed in HPI above).  Objective:   BP 126/80  Pulse 83  Temp(Src) 98.6 F (37 C) (Oral)  Ht 5\' 11"  (1.803 m)  Wt 218 lb 8 oz (99.111 kg)  BMI 30.49 kg/m2  SpO2 95% Wt Readings from Last 3 Encounters:  06/09/13 218 lb 8 oz (99.111 kg)  02/22/13 217 lb 8 oz (98.657 kg)  11/29/12 220 lb (99.791 kg)     General: Appears his stated age, well developed, well nourished in NAD. HEENT: Head: normal shape and size; Eyes: sclera white, no icterus, conjunctiva pink, PERRLA and EOMs intact; Ears: Tm's gray and intact, normal light reflex; Nose: mucosa pink and moist, septum midline; Throat/Mouth: + PND. Teeth present, mucosa erythematous and moist, no exudate noted, no lesions or ulcerations noted.  Neck: Mild cervical lymphadenopathy. Neck supple, trachea midline. No massses, lumps or thyromegaly present.  Cardiovascular: Normal rate and rhythm. S1,S2 noted.  No murmur, rubs or gallops noted. No JVD or BLE edema. No carotid bruits noted. Pulmonary/Chest: Normal effort and scattered rhonchi throughout. No respiratory distress.      Assessment & Plan:   Acute Bronchitis:  Get some rest and drink plenty of water Do salt water gargles for the sore throat eRx  for Azithromax x 5 days Pt declined RX for cough syrup  RTC as needed or if symptoms persist.

## 2013-06-21 ENCOUNTER — Other Ambulatory Visit: Payer: Self-pay | Admitting: Family Medicine

## 2013-06-21 MED ORDER — ROSUVASTATIN CALCIUM 40 MG PO TABS: 40.0000 mg | ORAL_TABLET | Freq: Every day | ORAL | Status: DC

## 2013-06-21 MED ORDER — POTASSIUM CHLORIDE CRYS ER 20 MEQ PO TBCR: 20.0000 meq | EXTENDED_RELEASE_TABLET | Freq: Every day | ORAL | Status: DC

## 2013-06-21 MED ORDER — METOPROLOL TARTRATE 25 MG PO TABS: 25.0000 mg | ORAL_TABLET | Freq: Two times a day (BID) | ORAL | Status: DC

## 2013-07-03 ENCOUNTER — Telehealth (INDEPENDENT_AMBULATORY_CARE_PROVIDER_SITE_OTHER): Payer: Medicare PPO | Admitting: Family Medicine

## 2013-07-03 DIAGNOSIS — E785 Hyperlipidemia, unspecified: Secondary | ICD-10-CM

## 2013-07-03 DIAGNOSIS — E119 Type 2 diabetes mellitus without complications: Secondary | ICD-10-CM

## 2013-07-03 DIAGNOSIS — N259 Disorder resulting from impaired renal tubular function, unspecified: Secondary | ICD-10-CM

## 2013-07-03 DIAGNOSIS — I10 Essential (primary) hypertension: Secondary | ICD-10-CM

## 2013-07-03 DIAGNOSIS — Z125 Encounter for screening for malignant neoplasm of prostate: Secondary | ICD-10-CM | POA: Insufficient documentation

## 2013-07-03 DIAGNOSIS — M109 Gout, unspecified: Secondary | ICD-10-CM

## 2013-07-03 NOTE — Telephone Encounter (Signed)
Message copied by [name] on Sun Jul 03, 2013  5:42 PM ------      Message from: Ellamae Sia      Created: Tue Jun 28, 2013 10:06 AM      Regarding: Lab orders for Monday, 10.27.14       Patient is scheduled for CPX labs, please order future labs, Thanks , Terri       ------

## 2013-07-04 ENCOUNTER — Encounter: Payer: Self-pay | Admitting: Family Medicine

## 2013-07-04 ENCOUNTER — Other Ambulatory Visit (INDEPENDENT_AMBULATORY_CARE_PROVIDER_SITE_OTHER): Payer: Medicare PPO

## 2013-07-04 ENCOUNTER — Ambulatory Visit (INDEPENDENT_AMBULATORY_CARE_PROVIDER_SITE_OTHER): Payer: Medicare PPO | Admitting: Family Medicine

## 2013-07-04 VITALS — BP 132/68 | HR 61 | Temp 97.5°F | Ht 71.0 in | Wt 218.0 lb

## 2013-07-04 DIAGNOSIS — E119 Type 2 diabetes mellitus without complications: Secondary | ICD-10-CM

## 2013-07-04 DIAGNOSIS — E785 Hyperlipidemia, unspecified: Secondary | ICD-10-CM

## 2013-07-04 DIAGNOSIS — R059 Cough, unspecified: Secondary | ICD-10-CM

## 2013-07-04 DIAGNOSIS — M109 Gout, unspecified: Secondary | ICD-10-CM

## 2013-07-04 DIAGNOSIS — Z125 Encounter for screening for malignant neoplasm of prostate: Secondary | ICD-10-CM

## 2013-07-04 DIAGNOSIS — N259 Disorder resulting from impaired renal tubular function, unspecified: Secondary | ICD-10-CM

## 2013-07-04 DIAGNOSIS — I10 Essential (primary) hypertension: Secondary | ICD-10-CM

## 2013-07-04 DIAGNOSIS — R05 Cough: Secondary | ICD-10-CM | POA: Insufficient documentation

## 2013-07-04 LAB — COMPREHENSIVE METABOLIC PANEL
Albumin: 4.5 g/dL (ref 3.5–5.2)
Alkaline Phosphatase: 61 U/L (ref 39–117)
CO2: 31 mEq/L (ref 19–32)
Calcium: 10.3 mg/dL (ref 8.4–10.5)
Chloride: 98 mEq/L (ref 96–112)
GFR: 56.53 mL/min — ABNORMAL LOW (ref >60.00)
Glucose, Bld: 124 mg/dL — ABNORMAL HIGH (ref 70–99)
Potassium: 4.2 mEq/L (ref 3.5–5.1)
Sodium: 140 mEq/L (ref 135–145)
Total Protein: 7.4 g/dL (ref 6.0–8.3)

## 2013-07-04 LAB — CBC WITH DIFFERENTIAL/PLATELET
Basophils Absolute: 0 10*3/uL (ref 0.0–0.1)
Basophils Relative: 0.6 % (ref 0.0–3.0)
Eosinophils Absolute: 0.5 10*3/uL (ref 0.0–0.7)
Eosinophils Relative: 5.8 % — ABNORMAL HIGH (ref 0.0–5.0)
HCT: 45.3 % (ref 39.0–52.0)
Hemoglobin: 15.4 g/dL (ref 13.0–17.0)
Lymphs Abs: 2.5 10*3/uL (ref 0.7–4.0)
MCV: 88.9 fl (ref 78.0–100.0)
Monocytes Absolute: 0.7 10*3/uL (ref 0.1–1.0)
Monocytes Relative: 8 % (ref 3.0–12.0)
Neutro Abs: 4.5 10*3/uL (ref 1.4–7.7)
RBC: 5.09 Mil/uL (ref 4.22–5.81)
RDW: 13 % (ref 11.5–14.6)
WBC: 8.2 10*3/uL (ref 4.5–10.5)

## 2013-07-04 LAB — PSA: PSA: 0.81 ng/mL (ref 0.10–4.00)

## 2013-07-04 LAB — URIC ACID: Uric Acid, Serum: 6.7 mg/dL (ref 4.0–7.8)

## 2013-07-04 LAB — TSH: TSH: 2.77 u[IU]/mL (ref 0.35–5.50)

## 2013-07-04 LAB — LDL CHOLESTEROL, DIRECT: Direct LDL: 65.4 mg/dL

## 2013-07-04 LAB — LIPID PANEL
Cholesterol: 134 mg/dL (ref 0–200)
Total CHOL/HDL Ratio: 4
VLDL: 52.2 mg/dL — ABNORMAL HIGH (ref 0.0–40.0)

## 2013-07-04 LAB — HEMOGLOBIN A1C: Hgb A1c MFr Bld: 6.3 % (ref 4.6–6.5)

## 2013-07-04 MED ORDER — BENZONATATE 200 MG PO CAPS: 200.0000 mg | ORAL_CAPSULE | Freq: Three times a day (TID) | ORAL | Status: DC | PRN

## 2013-07-04 NOTE — Progress Notes (Signed)
Subjective:    Patient ID: Kurt Aguilar, male    DOB: [birth year], 67 y.o.   MRN: [id number]  HPI Here for cough  Was tx for acute bronchitis with 10/14 - and tx with zpak here (had already been sick for 2 weeks) Did not want to take prednisone due to DM Took the zpak  Still has a cough  Also burning in chest - not severe but persistent -and production - phlegm is scant with a bit of color Feels rattling and not a lot coming up  A lot of throat clearning   Some nasal symptoms -- uses netti pot for cong No facial pain now  Ears and throat feel ok   No otc meds   Using some cough drops  Patient Active Problem List   Diagnosis Date Noted  . Prostate cancer screening 07/03/2013  . Right bundle branch block 02/22/2013  . Chronic low back pain 05/13/2011  . OBSTRUCTIVE SLEEP APNEA 03/15/2010  . HYPERLIPIDEMIA 04/12/2009  . GOUT 04/12/2009  . HYPERTENSION 04/12/2009  . CAD 04/12/2009  . GERD 04/12/2009  . RENAL INSUFFICIENCY 04/12/2009  . Diabetes type 2, controlled 04/12/2009  . DIVERTICULITIS, HX OF 04/12/2009   Past Medical History  Diagnosis Date  . Diverticulitis   . Gout   . Hyperlipidemia   . Hypertension   . Osteoporosis   . GERD (gastroesophageal reflux disease)   . Atypical migraine   . Hyperglycemia    Past Surgical History  Procedure Laterality Date  . Open heart surgery  09/09/1995-1998    5 bypass  . Tonsillectomy    . Repair knee ligament      right   History  Substance Use Topics  . Smoking status: Former Smoker -- 1.00 packs/day for 3 years    Types: Cigars    Quit date: 11/25/2004  . Smokeless tobacco: Not on file     Comment: occas. cigar quit 2006  . Alcohol Use: 0.5 oz/week    1 drink(s) per week     Comment: occas. social   Family History  Problem Relation Age of Onset  . Heart disease Mother   . Heart disease Father   . Stroke Father    Allergies  Allergen Reactions  . Oxycodone-Acetaminophen    Current Outpatient  Prescriptions on File Prior to Visit  Medication Sig Dispense Refill  . acetaminophen (TYLENOL) 500 MG tablet Take 500 mg by mouth 2 (two) times daily.        Marland Kitchen albuterol (VENTOLIN HFA) 108 (90 BASE) MCG/ACT inhaler Inhale 2 puffs into the lungs every 4 (four) hours as needed for wheezing or shortness of breath (tight lungs ).  1 Inhaler  3  . allopurinol (ZYLOPRIM) 100 MG tablet Take 100 mg by mouth every other day.      Marland Kitchen aspirin 81 MG tablet Takes 2 tablets daily.      Marland Kitchen azithromycin (ZITHROMAX) 250 MG tablet Take 2 tablets today, then 1 tablet daily for 4 days  6 tablet  0  . cetirizine (ZYRTEC) 10 MG tablet Take 10 mg by mouth as needed.       . chlorthalidone (HYGROTON) 25 MG tablet Take 1 tablet (25 mg total) by mouth daily.  90 tablet  0  . ezetimibe (ZETIA) 10 MG tablet Take 0.5 tablets (5 mg total) by mouth daily.  90 tablet  1  . fish oil-omega-3 fatty acids 1000 MG capsule Take 1 g by mouth 2 (two) times daily.       Marland Kitchen  Glucosamine HCl 1500 MG TABS Take 1 tablet by mouth 2 (two) times daily.      Marland Kitchen glucose blood (PRODIGY TEST) test strip Check blood glucose once daily and as needed for DM2 (250.00)  200 each  0  . glucose blood test strip 1 each by Other route. one touch ultra blue test strips to check glucose qd and prn      . Lutein 20 MG CAPS Take 1 capsule by mouth daily.        . metoprolol tartrate (LOPRESSOR) 25 MG tablet Take 1 tablet (25 mg total) by mouth 2 (two) times daily.  180 tablet  1  . omeprazole (PRILOSEC) 20 MG capsule Take 1 capsule (20 mg total) by mouth 2 (two) times daily.  180 capsule  0  . polycarbophil (FIBERCON) 625 MG tablet Take 625 mg by mouth daily.      . potassium chloride SA (K-DUR,KLOR-CON) 20 MEQ tablet Take 1 tablet (20 mEq total) by mouth daily.  90 tablet  1  . PRODIGY LANCETS 26G MISC 1 each by Does not apply route daily. To check glucose qd and prn for DM2 (250.00)  100 each  3  . rosuvastatin (CRESTOR) 40 MG tablet Take 1 tablet (40 mg total)  by mouth daily.  90 tablet  1  . vardenafil (LEVITRA) 20 MG tablet Take 20 mg by mouth as needed.        . vitamin C (ASCORBIC ACID) 500 MG tablet Take 500 mg by mouth daily.      . furosemide (LASIX) 20 MG tablet Take 1 tablet (20 mg total) by mouth daily as needed.  30 tablet  3   No current facility-administered medications on file prior to visit.      Review of Systems Review of Systems  Constitutional: Negative for fever, appetite change, fatigue and unexpected weight change.  ENt pos for cong and post nasal drip Eyes: Negative for pain and visual disturbance.  Respiratory: Negative for shortness of breath.   Cardiovascular: Negative for cp or palpitations    Gastrointestinal: Negative for nausea, diarrhea and constipation.  Genitourinary: Negative for urgency and frequency.  Skin: Negative for pallor or rash   Neurological: Negative for weakness, light-headedness, numbness and headaches.  Hematological: Negative for adenopathy. Does not bruise/bleed easily.  Psychiatric/Behavioral: Negative for dysphoric mood. The patient is not nervous/anxious.         Objective:   Physical Exam  Constitutional: He appears well-developed and well-nourished. No distress.  obese and well appearing   HENT:  Head: Normocephalic and atraumatic.  Mouth/Throat: Oropharynx is clear and moist.  Eyes: Conjunctivae and EOM are normal. Pupils are equal, round, and reactive to light. Right eye exhibits no discharge. Left eye exhibits no discharge.  Neck: Normal range of motion. Neck supple.  Cardiovascular: Normal rate and regular rhythm.   Pulmonary/Chest: Effort normal and breath sounds normal. No respiratory distress. He has no wheezes. He has no rales. He exhibits no tenderness.  Harsh bs/ upper airway congestion and throat clearing   Musculoskeletal: He exhibits no edema.  Lymphadenopathy:    He has no cervical adenopathy.  Neurological: He is alert.  Skin: Skin is warm and dry. No rash noted.   Psychiatric: He has a normal mood and affect.          Assessment & Plan:

## 2013-07-04 NOTE — Patient Instructions (Signed)
I think you have a post viral cough syndrome  Try the tessalon pills three times daily  mucinex DM up to every 12 hours to loosen cough Drink lots of fluids Breathe steam  Use nasal saline or netti pos  Update if not starting to improve in a week or if worsening

## 2013-07-04 NOTE — Assessment & Plan Note (Signed)
Persistent cough cycle after tx for bronchitis Reassuring exam Tessalon for cough mucinex DM for cong also Will try to break the cough cycle  See AVS Update if not starting to improve in a week or if worsening

## 2013-07-11 ENCOUNTER — Encounter: Payer: Self-pay | Admitting: Family Medicine

## 2013-07-11 ENCOUNTER — Ambulatory Visit (INDEPENDENT_AMBULATORY_CARE_PROVIDER_SITE_OTHER): Payer: Medicare PPO | Admitting: Family Medicine

## 2013-07-11 VITALS — BP 112/72 | HR 63 | Temp 97.9°F | Ht 70.5 in | Wt 216.2 lb

## 2013-07-11 DIAGNOSIS — E785 Hyperlipidemia, unspecified: Secondary | ICD-10-CM

## 2013-07-11 DIAGNOSIS — Z125 Encounter for screening for malignant neoplasm of prostate: Secondary | ICD-10-CM

## 2013-07-11 DIAGNOSIS — E119 Type 2 diabetes mellitus without complications: Secondary | ICD-10-CM

## 2013-07-11 DIAGNOSIS — Z23 Encounter for immunization: Secondary | ICD-10-CM

## 2013-07-11 DIAGNOSIS — I10 Essential (primary) hypertension: Secondary | ICD-10-CM

## 2013-07-11 DIAGNOSIS — Z0000 Encounter for general adult medical examination without abnormal findings: Secondary | ICD-10-CM

## 2013-07-11 MED ORDER — GLUCOSE BLOOD VI STRP: ORAL_STRIP | Status: DC

## 2013-07-11 MED ORDER — METOPROLOL TARTRATE 25 MG PO TABS: 25.0000 mg | ORAL_TABLET | Freq: Two times a day (BID) | ORAL | Status: DC

## 2013-07-11 MED ORDER — OMEPRAZOLE 20 MG PO CPDR: 20.0000 mg | DELAYED_RELEASE_CAPSULE | Freq: Two times a day (BID) | ORAL | Status: DC

## 2013-07-11 MED ORDER — CHLORTHALIDONE 25 MG PO TABS: 25.0000 mg | ORAL_TABLET | Freq: Every day | ORAL | Status: DC

## 2013-07-11 NOTE — Assessment & Plan Note (Signed)
psa is nl  No symptoms or family history  No DRE today Disc s/s of prostate problems to watch for

## 2013-07-11 NOTE — Progress Notes (Signed)
Subjective:    Patient ID: Kurt Aguilar, male    DOB: November 20, 1945, 67 y.o.   MRN: [id number]  HPI I have personally reviewed the Medicare Annual Wellness questionnaire and have noted 1. The patient's medical and social history 2. Their use of alcohol, tobacco or illicit drugs 3. Their current medications and supplements 4. The patient's functional ability including ADL's, fall risks, home safety risks and hearing or visual             impairment. 5. Diet and physical activities 6. Evidence for depression or mood disorders  The patients weight, height, BMI have been recorded in the chart and visual acuity is per eye clinic.  I have made referrals, counseling and provided education to the patient based review of the above and I have provided the pt with a written personalized care plan for preventive services.  See scanned forms.  Routine anticipatory guidance given to patient.  See health maintenance. Flu - had that today  Shingles 10/12 vaccine  PNA 1/04 - needs one today  Tetanus 106 vaccine  Colon 2008 at the [facility] - thinks 10 year recall Prostate cancer screening-  Lab Results  Component Value Date   PSA 0.81 07/04/2013   no problems with urination - does have nocturia 1-2 times per night - drinks a lot of water   Advance directive-has a living will  Cognitive function addressed- see scanned forms- and if abnormal then additional documentation follows. - no concerns at all   PMH and SH reviewed  Meds, vitals, and allergies reviewed.   ROS: See HPI.  Otherwise negative.    Wt is down another 2 lb - he has been working very hard on diet and exercise     Chemistry      Component Value Date/Time   NA 140 07/04/2013 0852   K 4.2 07/04/2013 0852   CL 98 07/04/2013 0852   CO2 31 07/04/2013 0852   BUN 26* 07/04/2013 0852   CREATININE 1.3 07/04/2013 0852      Component Value Date/Time   CALCIUM 10.3 07/04/2013 0852   ALKPHOS 61 07/04/2013 0852   AST 28 07/04/2013 0852    ALT 24 07/04/2013 0852   BILITOT 0.9 07/04/2013 0852      Lab Results  Component Value Date   CHOL 134 07/04/2013   CHOL 120 11/24/2012   CHOL 117 08/18/2012   Lab Results  Component Value Date   HDL 38.20* 07/04/2013   HDL 37.00* 11/24/2012   HDL 34.10* 08/18/2012   Lab Results  Component Value Date   LDLCALC 53 11/24/2012   LDLCALC 72 09/04/2010   LDLCALC 91 07/19/2009   Lab Results  Component Value Date   TRIG 261.0* 07/04/2013   TRIG 149.0 11/24/2012   TRIG 355.0* 08/18/2012   Lab Results  Component Value Date   CHOLHDL 4 07/04/2013   CHOLHDL 3 11/24/2012   CHOLHDL 3 08/18/2012   Lab Results  Component Value Date   LDLDIRECT 65.4 07/04/2013   LDLDIRECT 57.1 08/18/2012   LDLDIRECT 56.5 11/25/2010   is taking crestor 40 mg and diet   Lab Results  Component Value Date   WBC 8.2 07/04/2013   HGB 15.4 07/04/2013   HCT 45.3 07/04/2013   MCV 88.9 07/04/2013   PLT 243.0 07/04/2013   Lab Results  Component Value Date   TSH 2.77 07/04/2013     Dm Lab Results  Component Value Date   HGBA1C 6.3 07/04/2013   was on a  boat for a week- poor diet and exercise at that time -otherwise very good    . Patient Active Problem List   Diagnosis Date Noted  . Encounter for Medicare annual wellness exam 07/11/2013  . Cough 07/04/2013  . Prostate cancer screening 07/03/2013  . Right bundle branch block 02/22/2013  . Chronic low back pain 05/13/2011  . OBSTRUCTIVE SLEEP APNEA 03/15/2010  . HYPERLIPIDEMIA 04/12/2009  . GOUT 04/12/2009  . HYPERTENSION 04/12/2009  . CAD 04/12/2009  . GERD 04/12/2009  . RENAL INSUFFICIENCY 04/12/2009  . Diabetes type 2, controlled 04/12/2009  . DIVERTICULITIS, HX OF 04/12/2009   Past Medical History  Diagnosis Date  . Diverticulitis   . Gout   . Hyperlipidemia   . Hypertension   . Osteoporosis   . GERD (gastroesophageal reflux disease)   . Atypical migraine   . Hyperglycemia    Past Surgical History  Procedure Laterality Date   . Open heart surgery  09/09/1995-1998    5 bypass  . Tonsillectomy    . Repair knee ligament      right   History  Substance Use Topics  . Smoking status: Former Smoker -- 1.00 packs/day for 3 years    Types: Cigars    Quit date: 11/25/2004  . Smokeless tobacco: Not on file     Comment: occas. cigar quit 2006  . Alcohol Use: 0.5 oz/week    1 drink(s) per week     Comment: occas. social   Family History  Problem Relation Age of Onset  . Heart disease Mother   . Heart disease Father   . Stroke Father    Allergies  Allergen Reactions  . Oxycodone-Acetaminophen    Current Outpatient Prescriptions on File Prior to Visit  Medication Sig Dispense Refill  . allopurinol (ZYLOPRIM) 100 MG tablet Take 100 mg by mouth every other day.      Marland Kitchen aspirin 81 MG tablet Takes 2 tablets daily.      . benzonatate (TESSALON) 200 MG capsule Take 1 capsule (200 mg total) by mouth 3 (three) times daily as needed for cough. Swallow whole  30 capsule  0  . cetirizine (ZYRTEC) 10 MG tablet Take 10 mg by mouth as needed.       . ezetimibe (ZETIA) 10 MG tablet Take 0.5 tablets (5 mg total) by mouth daily.  90 tablet  1  . glucose blood test strip 1 each by Other route. one touch ultra blue test strips to check glucose qd and prn      . Lutein 20 MG CAPS Take 1 capsule by mouth daily.        . polycarbophil (FIBERCON) 625 MG tablet Take 625 mg by mouth daily.      . potassium chloride SA (K-DUR,KLOR-CON) 20 MEQ tablet Take 1 tablet (20 mEq total) by mouth daily.  90 tablet  1  . PRODIGY LANCETS 26G MISC 1 each by Does not apply route daily. To check glucose qd and prn for DM2 (250.00)  100 each  3  . rosuvastatin (CRESTOR) 40 MG tablet Take 1 tablet (40 mg total) by mouth daily.  90 tablet  1  . vardenafil (LEVITRA) 20 MG tablet Take 20 mg by mouth as needed.        . vitamin C (ASCORBIC ACID) 500 MG tablet Take 500 mg by mouth daily.      . furosemide (LASIX) 20 MG tablet Take 1 tablet (20 mg total) by  mouth daily as needed.  30 tablet  3   No current facility-administered medications on file prior to visit.    Review of Systems    Review of Systems  Constitutional: Negative for fever, appetite change, fatigue and unexpected weight change.  Eyes: Negative for pain and visual disturbance.  Respiratory: Negative for cough and shortness of breath.   Cardiovascular: Negative for cp or palpitations    Gastrointestinal: Negative for nausea, diarrhea and constipation.  Genitourinary: Negative for urgency and frequency.  Skin: Negative for pallor or rash   Neurological: Negative for weakness, light-headedness, numbness and headaches.  Hematological: Negative for adenopathy. Does not bruise/bleed easily.  Psychiatric/Behavioral: Negative for dysphoric mood. The patient is not nervous/anxious.      Objective:   Physical Exam  Constitutional: He appears well-developed and well-nourished. No distress.  obese and well appearing   HENT:  Head: Normocephalic and atraumatic.  Right Ear: External ear normal.  Left Ear: External ear normal.  Mouth/Throat: Oropharynx is clear and moist.  Eyes: Conjunctivae and EOM are normal. Pupils are equal, round, and reactive to light. Right eye exhibits no discharge. Left eye exhibits no discharge. No scleral icterus.  Neck: Normal range of motion. Neck supple. No JVD present. Carotid bruit is not present. No thyromegaly present.  Cardiovascular: Normal rate, regular rhythm and intact distal pulses.  Exam reveals no gallop.   Pulmonary/Chest: Effort normal and breath sounds normal. No respiratory distress. He has no wheezes.  Abdominal: Soft. Bowel sounds are normal. He exhibits no distension, no abdominal bruit and no mass. There is no tenderness.  Musculoskeletal: He exhibits no edema and no tenderness.  Neurological: He is alert. He has normal reflexes. No cranial nerve deficit. He exhibits normal muscle tone. Coordination normal.  Skin: Skin is warm and  dry. No rash noted. No erythema. No pallor.  Psychiatric: He has a normal mood and affect.          Assessment & Plan:

## 2013-07-11 NOTE — Assessment & Plan Note (Signed)
BP: 112/72 mmHg   bp in fair control at this time  No changes needed  Disc lifstyle change with low sodium diet and exercise   Labs rev

## 2013-07-11 NOTE — Assessment & Plan Note (Signed)
Reviewed health habits including diet and exercise and skin cancer prevention Also reviewed health mt list, fam hx and immunizations  See HPI Wellness labs reviewed  

## 2013-07-11 NOTE — Patient Instructions (Signed)
Keep up the good work with diet and exercise Labs are fairly stable  Pneumonia vaccine today Follow up in 6 months with labs prior

## 2013-07-11 NOTE — Assessment & Plan Note (Signed)
Lab Results  Component Value Date   HGBA1C 6.3 07/04/2013   Up a bit-poss due to a vacation with poor eating  Enc to keep working on healthy diet/exercise and wt loss  F/u 6 mo with lab prior

## 2013-07-11 NOTE — Assessment & Plan Note (Signed)
Disc goals for lipids and reasons to control them Rev labs with pt Rev low sat fat diet in detail  Will continue statin and zetia and diet

## 2013-08-15 ENCOUNTER — Ambulatory Visit (INDEPENDENT_AMBULATORY_CARE_PROVIDER_SITE_OTHER): Payer: Medicare PPO | Admitting: Family Medicine

## 2013-08-15 ENCOUNTER — Encounter: Payer: Self-pay | Admitting: Family Medicine

## 2013-08-15 ENCOUNTER — Ambulatory Visit (INDEPENDENT_AMBULATORY_CARE_PROVIDER_SITE_OTHER)
Admission: RE | Admit: 2013-08-15 | Discharge: 2013-08-15 | Disposition: A | Discharge status: Home / Self Care | Payer: Medicare PPO | Source: Ambulatory Visit | Attending: Family Medicine | Admitting: Family Medicine

## 2013-08-15 VITALS — BP 128/80 | HR 76 | Wt 219.0 lb

## 2013-08-15 DIAGNOSIS — M109 Gout, unspecified: Secondary | ICD-10-CM

## 2013-08-15 LAB — URIC ACID: Uric Acid, Serum: 7.5 mg/dL (ref 4.0–7.8)

## 2013-08-15 MED ORDER — COLCHICINE 0.6 MG PO TABS: 0.6000 mg | ORAL_TABLET | Freq: Two times a day (BID) | ORAL | Status: DC

## 2013-08-15 NOTE — Assessment & Plan Note (Signed)
Episodic since sept (after stopping allopurinol for a year) Is back on it bid Uric acid today Film of L ankle today  Colchicine prn pain  Info on gout and diet given  Update if not starting to improve in a week or if worsening

## 2013-08-15 NOTE — Patient Instructions (Signed)
Xray today Lab today for uric acid  Drink lots of water  Elevate ankle -warm compres if it helps  Use colchicine as needed for acute gout pain with a meal  Continue allopurinol as well

## 2013-08-15 NOTE — Progress Notes (Signed)
Pre visit review using our clinic review tool, if applicable. No additional management support is needed unless otherwise documented below in the visit note. 

## 2013-08-15 NOTE — Progress Notes (Signed)
Subjective:    Patient ID: Kurt Aguilar, male    DOB: 11-24-1945, 67 y.o.   MRN: [id number]  HPI Here for gout in L foot   (was in both feet and R elbow ) It came on when he was on his cruise  Waxed and waned from Sept 17th  Affected areas were painful and hot      On allopurinol - was off of it for a year-- started it back on Sept 28th - now takes it day and night  Taking bicarb  Never had any acute gout meds before  Had been over a year   Does not drink alcohol  He changed vitamins - to a food supplement   Lasix - has on his list - to use for travel only- but did not take it  He takes chlorthalidone however - every day   Patient Active Problem List   Diagnosis Date Noted  . Encounter for Medicare annual wellness exam 07/11/2013  . Cough 07/04/2013  . Prostate cancer screening 07/03/2013  . Right bundle branch block 02/22/2013  . Chronic low back pain 05/13/2011  . OBSTRUCTIVE SLEEP APNEA 03/15/2010  . HYPERLIPIDEMIA 04/12/2009  . GOUT 04/12/2009  . HYPERTENSION 04/12/2009  . CAD 04/12/2009  . GERD 04/12/2009  . RENAL INSUFFICIENCY 04/12/2009  . Diabetes type 2, controlled 04/12/2009  . DIVERTICULITIS, HX OF 04/12/2009   Past Medical History  Diagnosis Date  . Diverticulitis   . Gout   . Hyperlipidemia   . Hypertension   . Osteoporosis   . GERD (gastroesophageal reflux disease)   . Atypical migraine   . Hyperglycemia    Past Surgical History  Procedure Laterality Date  . Open heart surgery  09/09/1995-1998    5 bypass  . Tonsillectomy    . Repair knee ligament      right   History  Substance Use Topics  . Smoking status: Former Smoker -- 1.00 packs/day for 3 years    Types: Cigars    Quit date: 11/25/2004  . Smokeless tobacco: Not on file     Comment: occas. cigar quit 2006  . Alcohol Use: 0.5 oz/week    1 drink(s) per week     Comment: occas. social   Family History  Problem Relation Age of Onset  . Heart disease Mother   . Heart disease  Father   . Stroke Father    Allergies  Allergen Reactions  . Oxycodone-Acetaminophen    Current Outpatient Prescriptions on File Prior to Visit  Medication Sig Dispense Refill  . allopurinol (ZYLOPRIM) 100 MG tablet Take 100 mg by mouth every other day.      Marland Kitchen aspirin 81 MG tablet Takes 2 tablets daily.      . benzonatate (TESSALON) 200 MG capsule Take 1 capsule (200 mg total) by mouth 3 (three) times daily as needed for cough. Swallow whole  30 capsule  0  . cetirizine (ZYRTEC) 10 MG tablet Take 10 mg by mouth as needed.       . chlorthalidone (HYGROTON) 25 MG tablet Take 1 tablet (25 mg total) by mouth daily.  90 tablet  3  . DHA-EPA-Vitamin E (OMEGA-3 COMPLEX PO) Take 4 tablets by mouth daily.      Marland Kitchen ezetimibe (ZETIA) 10 MG tablet Take 0.5 tablets (5 mg total) by mouth daily.  90 tablet  1  . glucose blood (PRODIGY TEST) test strip Check blood glucose once daily and as needed for DM2 (250.00)  200 each  3  . glucose blood test strip 1 each by Other route. one touch ultra blue test strips to check glucose qd and prn      . Lutein 20 MG CAPS Take 1 capsule by mouth daily.        . metoprolol tartrate (LOPRESSOR) 25 MG tablet Take 1 tablet (25 mg total) by mouth 2 (two) times daily.  180 tablet  3  . NON FORMULARY Take 1 tablet by mouth 4 (four) times daily. FOOD NUTRIENT COMPLEX      . NON FORMULARY Take 1 tablet by mouth 2 (two) times daily. CELLULAR COMPLEX      . omeprazole (PRILOSEC) 20 MG capsule Take 1 capsule (20 mg total) by mouth 2 (two) times daily.  180 capsule  3  . polycarbophil (FIBERCON) 625 MG tablet Take 625 mg by mouth daily.      . potassium chloride SA (K-DUR,KLOR-CON) 20 MEQ tablet Take 1 tablet (20 mEq total) by mouth daily.  90 tablet  1  . PRODIGY LANCETS 26G MISC 1 each by Does not apply route daily. To check glucose qd and prn for DM2 (250.00)  100 each  3  . rosuvastatin (CRESTOR) 40 MG tablet Take 1 tablet (40 mg total) by mouth daily.  90 tablet  1  .  Specialty Vitamins Products (VARISAN VITALITY PO) Take 1 tablet by mouth 4 (four) times daily.      . vardenafil (LEVITRA) 20 MG tablet Take 20 mg by mouth as needed.        . vitamin C (ASCORBIC ACID) 500 MG tablet Take 500 mg by mouth daily.      . furosemide (LASIX) 20 MG tablet Take 1 tablet (20 mg total) by mouth daily as needed.  30 tablet  3   No current facility-administered medications on file prior to visit.     Review of Systems Review of Systems  Constitutional: Negative for fever, appetite change, fatigue and unexpected weight change.  Eyes: Negative for pain and visual disturbance.  Respiratory: Negative for shortness of breath.  pos for ongoing cough that is getting better  Cardiovascular: Negative for cp or palpitations    Gastrointestinal: Negative for nausea, diarrhea and constipation.  Genitourinary: Negative for urgency and frequency.  Skin: Negative for pallor or rash   MSK pos for joint swelling and pain/ neg for redness today Neurological: Negative for weakness, light-headedness, numbness and headaches.  Hematological: Negative for adenopathy. Does not bruise/bleed easily.  Psychiatric/Behavioral: Negative for dysphoric mood. The patient is not nervous/anxious.         Objective:   Physical Exam  Constitutional: He appears well-developed and well-nourished. No distress.  HENT:  Head: Atraumatic.  Eyes: Conjunctivae and EOM are normal. Pupils are equal, round, and reactive to light. No scleral icterus.  Neck: Normal range of motion. Neck supple.  Cardiovascular: Normal rate and regular rhythm.   Pulmonary/Chest: Effort normal and breath sounds normal. No respiratory distress. He has no wheezes. He has no rales.  No crackles  Musculoskeletal: He exhibits edema and tenderness.  Swelling and tenderness over L lateral malleolus  slt warm, no redness  Nl rom with pain to plantar flex  Bears weight with a limp   No other acute joint changes  Lymphadenopathy:      He has no cervical adenopathy.  Neurological: He is alert. He has normal reflexes.  Skin: Skin is warm and dry. No rash noted. No erythema.  Psychiatric: He has a normal mood  and affect.          Assessment & Plan:

## 2013-08-24 ENCOUNTER — Ambulatory Visit (INDEPENDENT_AMBULATORY_CARE_PROVIDER_SITE_OTHER): Payer: Medicare PPO | Admitting: Cardiovascular Disease

## 2013-08-24 ENCOUNTER — Encounter: Payer: Self-pay | Admitting: Cardiovascular Disease

## 2013-08-24 VITALS — BP 110/70 | HR 59 | Ht 70.5 in | Wt 219.5 lb

## 2013-08-24 DIAGNOSIS — E785 Hyperlipidemia, unspecified: Secondary | ICD-10-CM

## 2013-08-24 DIAGNOSIS — I4510 Unspecified right bundle-branch block: Secondary | ICD-10-CM

## 2013-08-24 DIAGNOSIS — I2510 Atherosclerotic heart disease of native coronary artery without angina pectoris: Secondary | ICD-10-CM

## 2013-08-24 DIAGNOSIS — E119 Type 2 diabetes mellitus without complications: Secondary | ICD-10-CM

## 2013-08-24 DIAGNOSIS — I10 Essential (primary) hypertension: Secondary | ICD-10-CM

## 2013-08-24 NOTE — Assessment & Plan Note (Signed)
Blood pressure is well controlled on today's visit. No changes made to the medications. 

## 2013-08-24 NOTE — Assessment & Plan Note (Signed)
Diabetes well controlled.

## 2013-08-24 NOTE — Assessment & Plan Note (Signed)
Intermittent right bundle branch block. Not visible on today's EKG. Asymptomatic. No further workup at this time

## 2013-08-24 NOTE — Progress Notes (Signed)
Patient ID: Kurt Aguilar, male    DOB: August 24, 1946, 67 y.o.   MRN: [id number]  HPI Comments: 67 yo with history of CAD CABG back in 20 in [place], [place], HTN, hyperlipidemia, presents for routine followup. EKGs show intermittent right bundle branch block. He is asymptomatic Hemoglobin A1c 6.1, total cholesterol 134 Recent travel to [place] where he had severe gout attack  Since his last clinic visit, he has had no health changes. No new symptoms.  He has been having significant problems with gout. Back on allopurinol.  Is tolerating his medications well.  stress test in March of 2011 shows mildly decreased perfusion in the anterior wall at rest with moderately decreased perfusion with stress. He had no significant symptoms with testing.  EKG shows normal sinus rhythm with rate 59 beats per minute, no significant ST or T wave changes,  Right bundle branch block has resolved    Past Medical History: Last updated: 05/14/2010 1.  CAD s/p CABG 1997 x 5.  Done in [place], [place].  Myoview in 2006 in ? [place] was normal per patient.  2.  Diverticulitis, hx of 3.  Gout 4.  Hyperlipidemia 5.  Hypertension 6.  osteoarthritis: knee OA -- injections in past / cortisone  7.  GERD  8.  atypical migraine - only gets aura  9.  hyperglycemia -- borderline DM  10.  CKD 11. Obstructive sleep apnea- NPSG 04/17/10- Moderate OSA AHI 36.3/hr,,,,CPAP 2. Cough- PFT 03/18/10- Mild obstructive airways dz w/o resp to BD. FEV1 2.65/ 86%; Ratio 0.77   Outpatient Encounter Prescriptions as of 08/24/2013  Medication Sig  . allopurinol (ZYLOPRIM) 100 MG tablet Take 100 mg by mouth 2 (two) times daily.   Marland Kitchen aspirin 81 MG tablet Takes 2 tablets daily.  . benzonatate (TESSALON) 200 MG capsule Take 1 capsule (200 mg total) by mouth 3 (three) times daily as needed for cough. Swallow whole  . cetirizine (ZYRTEC) 10 MG tablet Take 10 mg by mouth as needed.   . chlorthalidone (HYGROTON) 25 MG tablet Take 1 tablet  (25 mg total) by mouth daily.  . colchicine 0.6 MG tablet Take 1 tablet (0.6 mg total) by mouth 2 (two) times daily. With a meal as needed for acute flare of gout  . DHA-EPA-Vitamin E (OMEGA-3 COMPLEX PO) Take 4 tablets by mouth daily.  Marland Kitchen ezetimibe (ZETIA) 10 MG tablet Take 0.5 tablets (5 mg total) by mouth daily.  . furosemide (LASIX) 20 MG tablet Take 1 tablet (20 mg total) by mouth daily as needed.  Marland Kitchen glucose blood (PRODIGY TEST) test strip Check blood glucose once daily and as needed for DM2 (250.00)  . glucose blood test strip 1 each by Other route. one touch ultra blue test strips to check glucose qd and prn  . Lutein 20 MG CAPS Take 1 capsule by mouth daily.    . metoprolol tartrate (LOPRESSOR) 25 MG tablet Take 1 tablet (25 mg total) by mouth 2 (two) times daily.  . NON FORMULARY Take 1 tablet by mouth 4 (four) times daily. FOOD NUTRIENT COMPLEX  . NON FORMULARY Take 1 tablet by mouth 2 (two) times daily. CELLULAR COMPLEX  . omeprazole (PRILOSEC) 20 MG capsule Take 1 capsule (20 mg total) by mouth 2 (two) times daily.  . polycarbophil (FIBERCON) 625 MG tablet Take 625 mg by mouth daily.  . potassium chloride SA (K-DUR,KLOR-CON) 20 MEQ tablet Take 1 tablet (20 mEq total) by mouth daily.  Marland Kitchen PRODIGY LANCETS 26G MISC 1 each  by Does not apply route daily. To check glucose qd and prn for DM2 (250.00)  . rosuvastatin (CRESTOR) 40 MG tablet Take 1 tablet (40 mg total) by mouth daily.  Marland Kitchen Specialty Vitamins Products (VARISAN VITALITY PO) Take 1 tablet by mouth 4 (four) times daily.  . vardenafil (LEVITRA) 20 MG tablet Take 20 mg by mouth as needed.    . vitamin C (ASCORBIC ACID) 500 MG tablet Take 500 mg by mouth daily.     Review of Systems  Constitutional: Negative.   HENT: Negative.   Eyes: Negative.   Respiratory: Negative.   Cardiovascular: Negative.   Gastrointestinal: Negative.   Musculoskeletal: Positive for arthralgias.  Skin: Negative.   Neurological: Negative.    Psychiatric/Behavioral: Negative.   All other systems reviewed and are negative.   BP 110/70  Pulse 59  Ht 5' 10.5" (1.791 m)  Wt 219 lb 8 oz (99.565 kg)  BMI 31.04 kg/m2  Physical Exam  Nursing note and vitals reviewed. Constitutional: He is oriented to person, place, and time. He appears well-developed and well-nourished.  HENT:  Head: Normocephalic.  Nose: Nose normal.  Mouth/Throat: Oropharynx is clear and moist.  Eyes: Conjunctivae are normal. Pupils are equal, round, and reactive to light.  Neck: Normal range of motion. Neck supple. No JVD present.  Cardiovascular: Normal rate, regular rhythm, S1 normal, S2 normal, normal heart sounds and intact distal pulses.  Exam reveals no gallop and no friction rub.   No murmur heard. Pulmonary/Chest: Effort normal and breath sounds normal. No respiratory distress. He has no wheezes. He has no rales. He exhibits no tenderness.  Abdominal: Soft. Bowel sounds are normal. He exhibits no distension. There is no tenderness.  Musculoskeletal: Normal range of motion. He exhibits no edema and no tenderness.  Lymphadenopathy:    He has no cervical adenopathy.  Neurological: He is alert and oriented to person, place, and time. Coordination normal.  Skin: Skin is warm and dry. No rash noted. No erythema.  Psychiatric: He has a normal mood and affect. His behavior is normal. Judgment and thought content normal.      Assessment and Plan

## 2013-08-24 NOTE — Patient Instructions (Signed)
You are doing well. No medication changes were made.  Research the chlorthalidone and risk for gout  Please call us if you have new issues that need to be addressed before your next appt.  Your physician wants you to follow-up in: 6 months.  You will receive a reminder letter in the mail two months in advance. If you don't receive a letter, please call our office to schedule the follow-up appointment.

## 2013-08-24 NOTE — Assessment & Plan Note (Signed)
Cholesterol is at goal on the current lipid regimen. No changes to the medications were made.  

## 2013-08-24 NOTE — Assessment & Plan Note (Signed)
Currently with no symptoms of angina. No further workup at this time. Continue current medication regimen. 

## 2013-09-15 ENCOUNTER — Encounter: Payer: Self-pay | Admitting: Internal Medicine

## 2013-09-15 ENCOUNTER — Ambulatory Visit (INDEPENDENT_AMBULATORY_CARE_PROVIDER_SITE_OTHER): Payer: Medicare PPO | Admitting: Internal Medicine

## 2013-09-15 VITALS — BP 122/76 | HR 79 | Ht 70.0 in | Wt 225.0 lb

## 2013-09-15 DIAGNOSIS — J209 Acute bronchitis, unspecified: Secondary | ICD-10-CM

## 2013-09-15 DIAGNOSIS — G4733 Obstructive sleep apnea (adult) (pediatric): Secondary | ICD-10-CM

## 2013-09-15 MED ORDER — ALBUTEROL SULFATE HFA 108 (90 BASE) MCG/ACT IN AERS: 2.0000 | INHALATION_SPRAY | RESPIRATORY_TRACT | Status: DC | PRN

## 2013-09-15 MED ORDER — DOXYCYCLINE HYCLATE 100 MG PO TABS: ORAL_TABLET | ORAL | Status: DC

## 2013-09-15 MED ORDER — PROMETHAZINE-CODEINE 6.25-10 MG/5ML PO SYRP: 5.0000 mL | ORAL_SOLUTION | Freq: Four times a day (QID) | ORAL | Status: DC | PRN

## 2013-09-15 NOTE — Patient Instructions (Signed)
Script sent for doxycycline antibiotic and for ventolin/ albuterol rescue inhaler  Script printed for cough syrup  We can continue CPAP 13/ [facility]  Please call as needed

## 2013-09-15 NOTE — Progress Notes (Signed)
10/03/11- 33 yoM former smoker followed for OSA Wife here LOV 10/01/10 She returns for one year followup, still fully compliant with CPAP 11/[facility]. He says he sleeps well through the night. He likes his current fullface mask and humidifier. His wife notes only minimal snoring without witnessed apnea. He feels well rested and sees no need for any changes.  10/04/12-67 yoM former smoker followed for OSA           Wife here FOLLOWS FOR: wears CPAP 13/ [facility] every night for about 6 hours; pressure working well for patient at this. Full face mask. We had changed pressure from 11 in July, 2013, due to a leak. Now diagnosed with diabetes, diet-controlled, trying to lose weight. Weight loss would help sleep apnea as explained again.  09/15/13- 67 yoM former smoker followed for OSA, bronchitis, complicated by DM          Wife here FOLLOWS FOR: Currently using CPAP 13/ [facility] machine for 6 hours per night. Denies problems with machine, mask or pressure. Had bronchitis in October, relieved by Z-Pak. Now again 2 weeks of acute bronchitis with cough productive green but denies fever or sore throat. Some wheeze. Now diagnosed with diabetes and trying to avoid prednisone.  ROS-see HPI Constitutional:   No-   weight loss, night sweats, fevers, chills, fatigue, lassitude. HEENT:   No-  headaches, difficulty swallowing, tooth/dental problems, sore throat,       No-  sneezing, itching, ear ache, nasal congestion, post nasal drip,  CV:  No-   chest pain, orthopnea, PND, swelling in lower extremities, anasarca, dizziness, palpitations Resp: No-   shortness of breath with exertion or at rest.              + productive cough,  No non-productive cough,  No- coughing up of blood.              No-   change in color of mucus.  No- wheezing.   Skin: No-   rash or lesions. GI:  No-   heartburn, indigestion, abdominal pain, nausea, vomiting,  GU:  MS:  No-   joint pain or swelling. . Neuro-     nothing unusual Psych:  No-  change in mood or affect. No depression or anxiety.  No memory loss.  OBJ General- Alert, Oriented, Affect-appropriate, Distress- none acute Skin- rash-none, lesions- none, excoriation- none Lymphadenopathy- none Head- atraumatic            Eyes- Gross vision intact, PERRLA, conjunctivae clear secretions            Ears- Hearing, canals-normal            Nose- Clear, no-Septal dev, mucus, polyps, erosion, perforation             Throat- Mallampati II , mucosa clear , drainage- none, tonsils- atrophic Neck- flexible , trachea midline, no stridor , thyroid nl, carotid no bruit Chest - symmetrical excursion , unlabored           Heart/CV- RRR , no murmur , no gallop  , no rub, nl s1 s2                           - JVD- none , edema- none, stasis changes- none, varices- none           Lung- clear to P&A, wheeze- none, cough- none , dullness-none, rub- none  Chest wall-  Abd- Br/ Gen/ Rectal- Not done, not indicated Extrem- cyanosis- none, clubbing, none, atrophy- none, strength- nl Neuro- grossly intact to observation

## 2013-09-19 ENCOUNTER — Other Ambulatory Visit: Payer: Self-pay | Admitting: *Deleted

## 2013-09-19 MED ORDER — ALLOPURINOL 100 MG PO TABS: 100.0000 mg | ORAL_TABLET | Freq: Two times a day (BID) | ORAL | Status: DC

## 2013-09-19 MED ORDER — CHLORTHALIDONE 25 MG PO TABS: 25.0000 mg | ORAL_TABLET | Freq: Every day | ORAL | Status: DC

## 2013-10-04 ENCOUNTER — Ambulatory Visit: Payer: Medicare PPO | Admitting: Internal Medicine

## 2013-10-09 NOTE — Assessment & Plan Note (Signed)
Good compliance and control 

## 2013-10-09 NOTE — Assessment & Plan Note (Signed)
Second episode this winter. Plan-refill Ventolin inhaler. Doxycycline, codeine cough syrup

## 2013-12-15 ENCOUNTER — Other Ambulatory Visit: Payer: Self-pay

## 2014-01-27 ENCOUNTER — Telehealth: Payer: Self-pay | Admitting: *Deleted

## 2014-01-27 MED ORDER — ROSUVASTATIN CALCIUM 40 MG PO TABS: 40.0000 mg | ORAL_TABLET | Freq: Every day | ORAL | Status: DC

## 2014-01-27 MED ORDER — EZETIMIBE 10 MG PO TABS: 5.0000 mg | ORAL_TABLET | Freq: Every day | ORAL | Status: DC

## 2014-01-27 MED ORDER — CHLORTHALIDONE 25 MG PO TABS: 25.0000 mg | ORAL_TABLET | Freq: Every day | ORAL | Status: DC

## 2014-01-27 MED ORDER — POTASSIUM CHLORIDE CRYS ER 20 MEQ PO TBCR: 20.0000 meq | EXTENDED_RELEASE_TABLET | Freq: Every day | ORAL | Status: DC

## 2014-01-27 NOTE — Telephone Encounter (Signed)
Received a refill request for pt's kcl, crestor, and chlorthalidone. I refilled these meds and called him to schedule his 6 month f/u that was due around this time. I made him appointments for next week. He has an a1c and cmet in future orders, but I wanted to make sure you didn't want lipids or anything else done.

## 2014-01-27 NOTE — Telephone Encounter (Signed)
That is good-nothing to add, thanks

## 2014-02-01 ENCOUNTER — Other Ambulatory Visit (INDEPENDENT_AMBULATORY_CARE_PROVIDER_SITE_OTHER): Payer: Medicare PPO

## 2014-02-01 DIAGNOSIS — E785 Hyperlipidemia, unspecified: Secondary | ICD-10-CM

## 2014-02-01 DIAGNOSIS — E119 Type 2 diabetes mellitus without complications: Secondary | ICD-10-CM

## 2014-02-01 DIAGNOSIS — I10 Essential (primary) hypertension: Secondary | ICD-10-CM

## 2014-02-01 LAB — COMPREHENSIVE METABOLIC PANEL
ALT: 20 U/L (ref 0–53)
AST: 24 U/L (ref 0–37)
Albumin: 4.2 g/dL (ref 3.5–5.2)
Alkaline Phosphatase: 42 U/L (ref 39–117)
BILIRUBIN TOTAL: 1 mg/dL (ref 0.2–1.2)
BUN: 32 mg/dL — ABNORMAL HIGH (ref 6–23)
CALCIUM: 10 mg/dL (ref 8.4–10.5)
CO2: 32 meq/L (ref 19–32)
CREATININE: 1.4 mg/dL (ref 0.4–1.5)
Chloride: 99 mEq/L (ref 96–112)
GFR: 54.09 mL/min — AB (ref >60.00)
GLUCOSE: 116 mg/dL — AB (ref 70–99)
Potassium: 3.7 mEq/L (ref 3.5–5.1)
Sodium: 141 mEq/L (ref 135–145)
Total Protein: 7.1 g/dL (ref 6.0–8.3)

## 2014-02-01 LAB — HEMOGLOBIN A1C: Hgb A1c MFr Bld: 6 % (ref 4.6–6.5)

## 2014-02-03 ENCOUNTER — Ambulatory Visit (INDEPENDENT_AMBULATORY_CARE_PROVIDER_SITE_OTHER): Payer: Medicare PPO | Admitting: Family Medicine

## 2014-02-03 ENCOUNTER — Encounter: Payer: Self-pay | Admitting: Family Medicine

## 2014-02-03 VITALS — BP 122/68 | HR 64 | Temp 98.2°F | Ht 70.5 in | Wt 211.0 lb

## 2014-02-03 DIAGNOSIS — N259 Disorder resulting from impaired renal tubular function, unspecified: Secondary | ICD-10-CM

## 2014-02-03 DIAGNOSIS — I10 Essential (primary) hypertension: Secondary | ICD-10-CM

## 2014-02-03 DIAGNOSIS — E119 Type 2 diabetes mellitus without complications: Secondary | ICD-10-CM

## 2014-02-03 NOTE — Progress Notes (Signed)
Subjective:    Patient ID: Kurt Aguilar, male    DOB: Mar 28, 1946, 68 y.o.   MRN: [id number]  HPI Here for f/u of chronic health problems   Wt is down 14 lb  bmi 29 He is on a very stringent diet - about 3 months  Vegetables Meats - sauteed in coconut oil - occ chicken and steak  No sweats  No starchy vegetables-overall no carbohydrates  Salads with red wine vinegar on salads    bp is stable today  No cp or palpitations or headaches or edema  No side effects to medicines  BP Readings from Last 3 Encounters:  02/03/14 122/68  09/15/13 122/76  08/24/13 110/70      Renal insuff   Chemistry      Component Value Date/Time   NA 141 02/01/2014 0838   K 3.7 02/01/2014 0838   CL 99 02/01/2014 0838   CO2 32 02/01/2014 0838   BUN 32* 02/01/2014 0838   CREATININE 1.4 02/01/2014 0838      Component Value Date/Time   CALCIUM 10.0 02/01/2014 0838   ALKPHOS 42 02/01/2014 0838   AST 24 02/01/2014 0838   ALT 20 02/01/2014 0838   BILITOT 1.0 02/01/2014 0838     he is on high protien low carb diet  Perhaps not enough water   Some supplements   Diabetes Home sugar results - glucose is around 100 most of the time and then goes up to 120s  DM diet - not enough carbs  Exercise - a lot of that - walking at least 50 min per day with weights and water aerobics  Symptoms-none  A1C last  Lab Results  Component Value Date   HGBA1C 6.0 02/01/2014  from 6.3   No problems with medications  Renal protection not on ace due to cr currently  Last eye exam 5/15  Lab Results  Component Value Date   CHOL 134 07/04/2013   HDL 38.20* 07/04/2013   LDLCALC 53 11/24/2012   LDLDIRECT 65.4 07/04/2013   TRIG 261.0* 07/04/2013   CHOLHDL 4 07/04/2013   this is before diet change    Patient Active Problem List   Diagnosis Date Noted  . Encounter for Medicare annual wellness exam 07/11/2013  . Cough 07/04/2013  . Prostate cancer screening 07/03/2013  . Right bundle branch block 02/22/2013  .  Chronic low back pain 05/13/2011  . OBSTRUCTIVE SLEEP APNEA 03/15/2010  . HYPERLIPIDEMIA 04/12/2009  . GOUT 04/12/2009  . HYPERTENSION 04/12/2009  . CAD 04/12/2009  . GERD 04/12/2009  . RENAL INSUFFICIENCY 04/12/2009  . Diabetes type 2, controlled 04/12/2009  . DIVERTICULITIS, HX OF 04/12/2009   Past Medical History  Diagnosis Date  . Diverticulitis   . Gout   . Hyperlipidemia   . Hypertension   . Osteoporosis   . GERD (gastroesophageal reflux disease)   . Atypical migraine   . Hyperglycemia    Past Surgical History  Procedure Laterality Date  . Open heart surgery  09/09/1995-1998    5 bypass  . Tonsillectomy    . Repair knee ligament      right   History  Substance Use Topics  . Smoking status: Former Smoker -- 1.00 packs/day for 3 years    Types: Cigars    Quit date: 11/25/2004  . Smokeless tobacco: Not on file     Comment: occas. cigar quit 2006  . Alcohol Use: No   Family History  Problem Relation Age of Onset  . Heart  disease Mother   . Heart disease Father   . Stroke Father    Allergies  Allergen Reactions  . Oxycodone-Acetaminophen    Current Outpatient Prescriptions on File Prior to Visit  Medication Sig Dispense Refill  . albuterol (VENTOLIN HFA) 108 (90 BASE) MCG/ACT inhaler Inhale 2 puffs into the lungs every 4 (four) hours as needed for wheezing or shortness of breath (tight lungs ).  1 Inhaler  prn  . allopurinol (ZYLOPRIM) 100 MG tablet Take 1 tablet (100 mg total) by mouth 2 (two) times daily.  180 tablet  1  . aspirin 81 MG tablet Takes 2 tablets daily.      . benzonatate (TESSALON) 200 MG capsule Take 1 capsule (200 mg total) by mouth 3 (three) times daily as needed for cough. Swallow whole  30 capsule  0  . cetirizine (ZYRTEC) 10 MG tablet Take 10 mg by mouth as needed.       . chlorthalidone (HYGROTON) 25 MG tablet Take 1 tablet (25 mg total) by mouth daily.  90 tablet  0  . colchicine 0.6 MG tablet Take 1 tablet (0.6 mg total) by mouth 2  (two) times daily. With a meal as needed for acute flare of gout  30 tablet  0  . DHA-EPA-Vitamin E (OMEGA-3 COMPLEX PO) Take 4 tablets by mouth daily.      Marland Kitchen doxycycline (VIBRA-TABS) 100 MG tablet 2 today then one daily  8 tablet  1  . ezetimibe (ZETIA) 10 MG tablet Take 0.5 tablets (5 mg total) by mouth daily.  90 tablet  0  . glucosamine-chondroitin 500-400 MG tablet Take 1 tablet by mouth 2 (two) times daily.      Marland Kitchen glucose blood (PRODIGY TEST) test strip Check blood glucose once daily and as needed for DM2 (250.00)  200 each  3  . glucose blood test strip 1 each by Other route. one touch ultra blue test strips to check glucose qd and prn      . Lutein 20 MG CAPS Take 1 capsule by mouth daily.        . metoprolol tartrate (LOPRESSOR) 25 MG tablet Take 1 tablet (25 mg total) by mouth 2 (two) times daily.  180 tablet  3  . NON FORMULARY Take 1 tablet by mouth 4 (four) times daily. FOOD NUTRIENT COMPLEX      . NON FORMULARY Take 1 tablet by mouth 2 (two) times daily. CELLULAR COMPLEX      . omeprazole (PRILOSEC) 20 MG capsule Take 1 capsule (20 mg total) by mouth 2 (two) times daily.  180 capsule  3  . polycarbophil (FIBERCON) 625 MG tablet Take 625 mg by mouth daily.      . potassium chloride SA (K-DUR,KLOR-CON) 20 MEQ tablet Take 1 tablet (20 mEq total) by mouth daily.  90 tablet  0  . PRODIGY LANCETS 26G MISC 1 each by Does not apply route daily. To check glucose qd and prn for DM2 (250.00)  100 each  3  . promethazine-codeine (PHENERGAN WITH CODEINE) 6.25-10 MG/5ML syrup Take 5 mLs by mouth every 6 (six) hours as needed for cough.  180 mL  0  . rosuvastatin (CRESTOR) 40 MG tablet Take 1 tablet (40 mg total) by mouth daily.  90 tablet  0  . Specialty Vitamins Products (VARISAN VITALITY PO) Take 1 tablet by mouth 4 (four) times daily.      . vardenafil (LEVITRA) 20 MG tablet Take 20 mg by mouth as needed.        Marland Kitchen  vitamin C (ASCORBIC ACID) 500 MG tablet Take 500 mg by mouth daily.      .  furosemide (LASIX) 20 MG tablet Take 1 tablet (20 mg total) by mouth daily as needed.  30 tablet  3   No current facility-administered medications on file prior to visit.    Review of Systems Review of Systems  Constitutional: Negative for fever, appetite change, fatigue and unexpected weight change.  Eyes: Negative for pain and visual disturbance.  Respiratory: Negative for cough and shortness of breath.   Cardiovascular: Negative for cp or palpitations    Gastrointestinal: Negative for nausea, diarrhea and constipation.  Genitourinary: Negative for urgency and frequency.  Skin: Negative for pallor or rash   Neurological: Negative for weakness, light-headedness, numbness and headaches.  Hematological: Negative for adenopathy. Does not bruise/bleed easily.  Psychiatric/Behavioral: Negative for dysphoric mood. The patient is not nervous/anxious.         Objective:   Physical Exam  Constitutional: He appears well-developed and well-nourished. No distress.  HENT:  Head: Normocephalic and atraumatic.  Mouth/Throat: Oropharynx is clear and moist.  Eyes: Conjunctivae and EOM are normal. Pupils are equal, round, and reactive to light. Right eye exhibits no discharge. Left eye exhibits no discharge. No scleral icterus.  Neck: Normal range of motion. Neck supple. No JVD present. Carotid bruit is not present. No thyromegaly present.  Cardiovascular: Normal rate, regular rhythm, normal heart sounds and intact distal pulses.  Exam reveals no gallop.   Pulmonary/Chest: Effort normal and breath sounds normal. No respiratory distress. He has no wheezes. He exhibits no tenderness.  Abdominal: Soft. Bowel sounds are normal. He exhibits no distension, no abdominal bruit and no mass. There is no tenderness.  Musculoskeletal: He exhibits no edema.  No acute joint changes   Lymphadenopathy:    He has no cervical adenopathy.  Neurological: He is alert. He has normal reflexes. No cranial nerve deficit. He  exhibits normal muscle tone. Coordination normal.  Skin: Skin is warm and dry. No rash noted. No erythema. No pallor.  Psychiatric: He has a normal mood and affect.          Assessment & Plan:

## 2014-02-03 NOTE — Progress Notes (Signed)
Pre visit review using our clinic review tool, if applicable. No additional management support is needed unless otherwise documented below in the visit note. 

## 2014-02-03 NOTE — Patient Instructions (Signed)
Your weight loss is wonderful - but I think you need more carbs - 2 small servings of carbohydrates per meal (natural /no sugar added/ high fiber) Keep up the great exercise  Drink more water as well for your kidney function  Follow up in 6 months for annual exam with labs prior

## 2014-02-04 ENCOUNTER — Telehealth: Payer: Self-pay | Admitting: Family Medicine

## 2014-02-04 NOTE — Telephone Encounter (Signed)
Relevant patient education mailed to patient.  

## 2014-02-05 NOTE — Assessment & Plan Note (Signed)
Cr is up to 1.4 Suspect this may be due to high protein/ extremely low carb diet  Disc need for inc water intake  Also add some high fiber / complex carbs to diet  Want to avoid ketosis  Will follow closely  bp and glucose are very well controlled

## 2014-02-05 NOTE — Assessment & Plan Note (Signed)
bp in fair control at this time  BP Readings from Last 1 Encounters:  02/03/14 122/68   No changes needed Disc lifstyle change with low sodium diet and exercise

## 2014-02-05 NOTE — Assessment & Plan Note (Signed)
Lab Results  Component Value Date   HGBA1C 6.0 02/01/2014    Doing great with diet and exercise and wt loss-enc to keep it up

## 2014-04-12 ENCOUNTER — Ambulatory Visit (INDEPENDENT_AMBULATORY_CARE_PROVIDER_SITE_OTHER): Payer: Medicare PPO | Admitting: Cardiovascular Disease

## 2014-04-12 ENCOUNTER — Encounter: Payer: Self-pay | Admitting: Cardiovascular Disease

## 2014-04-12 VITALS — BP 120/70 | HR 56 | Ht 70.5 in | Wt 211.0 lb

## 2014-04-12 DIAGNOSIS — I10 Essential (primary) hypertension: Secondary | ICD-10-CM

## 2014-04-12 DIAGNOSIS — E119 Type 2 diabetes mellitus without complications: Secondary | ICD-10-CM

## 2014-04-12 DIAGNOSIS — E785 Hyperlipidemia, unspecified: Secondary | ICD-10-CM

## 2014-04-12 DIAGNOSIS — I4510 Unspecified right bundle-branch block: Secondary | ICD-10-CM

## 2014-04-12 DIAGNOSIS — I2510 Atherosclerotic heart disease of native coronary artery without angina pectoris: Secondary | ICD-10-CM

## 2014-04-12 DIAGNOSIS — N259 Disorder resulting from impaired renal tubular function, unspecified: Secondary | ICD-10-CM

## 2014-04-12 NOTE — Assessment & Plan Note (Signed)
Currently with no symptoms of angina. No further workup at this time. Continue current medication regimen. 

## 2014-04-12 NOTE — Assessment & Plan Note (Addendum)
Borderline elevated BUN and creatinine possibly from being on chlorthalidone. Suggested he decrease the chlorthalidone in half, even taking this as needed. Blood pressures running low with his recent weight loss. Suggested he cut back on his potassium when he does not take the chlorthalidone

## 2014-04-12 NOTE — Progress Notes (Signed)
Patient ID: Kurt Aguilar, male    DOB: [birth year], 68 y.o.   MRN: [id number]  HPI Comments: 68 yo with history of CAD CABG back in 54 in [place], [place], HTN, hyperlipidemia, presents for routine followup. EKGs show intermittent right bundle branch block. He is asymptomatic History of gout. Reports baseline creatinine mildly elevated  In followup today, he reports that he is feeling well. He is exercising on a regular basis. He has lost significant weight over the past year, now down to 200 pounds. He walks daily, watches his diet closely.  Previous Hemoglobin A1c 6.1, total cholesterol 134 He is taking zetia 5 mg daily.  stress test in March of 2011 shows mildly decreased perfusion in the anterior wall at rest with moderately decreased perfusion with stress. He had no significant symptoms with testing.  EKG shows normal sinus rhythm with rate 56 beats per minute, right bundle branch block    Past Medical History: Last updated: 05/14/2010 1.  CAD s/p CABG 1997 x 5.  Done in [place], [place].  Myoview in 2006 in ? [place] was normal per patient.  2.  Diverticulitis, hx of 3.  Gout 4.  Hyperlipidemia 5.  Hypertension 6.  osteoarthritis: knee OA -- injections in past / cortisone  7.  GERD  8.  atypical migraine - only gets aura  9.  hyperglycemia -- borderline DM  10.  CKD 11. Obstructive sleep apnea- NPSG 04/17/10- Moderate OSA AHI 36.3/hr,,,,CPAP 2. Cough- PFT 03/18/10- Mild obstructive airways dz w/o resp to BD. FEV1 2.65/ 86%; Ratio 0.77   Outpatient Encounter Prescriptions as of 04/12/2014  Medication Sig  . albuterol (VENTOLIN HFA) 108 (90 BASE) MCG/ACT inhaler Inhale 2 puffs into the lungs every 4 (four) hours as needed for wheezing or shortness of breath (tight lungs ).  Marland Kitchen allopurinol (ZYLOPRIM) 100 MG tablet Take 1 tablet (100 mg total) by mouth 2 (two) times daily.  Marland Kitchen aspirin 81 MG tablet Takes 2 tablets daily.  . benzonatate (TESSALON) 200 MG capsule Take 1 capsule (200  mg total) by mouth 3 (three) times daily as needed for cough. Swallow whole  . cetirizine (ZYRTEC) 10 MG tablet Take 10 mg by mouth as needed.   . chlorthalidone (HYGROTON) 25 MG tablet Take 1 tablet (25 mg total) by mouth daily.  . colchicine 0.6 MG tablet Take 1 tablet (0.6 mg total) by mouth 2 (two) times daily. With a meal as needed for acute flare of gout  . DHA-EPA-Vitamin E (OMEGA-3 COMPLEX PO) Take 4 tablets by mouth daily.  Marland Kitchen doxycycline (VIBRA-TABS) 100 MG tablet 2 today then one daily  . ezetimibe (ZETIA) 10 MG tablet Take 0.5 tablets (5 mg total) by mouth daily.  . furosemide (LASIX) 20 MG tablet Take 1 tablet (20 mg total) by mouth daily as needed.  . Glucosamine-Chondroit-Vit C-Mn (GLUCOSAMINE 1500 COMPLEX PO) Take by mouth daily.  Marland Kitchen glucose blood (PRODIGY TEST) test strip Check blood glucose once daily and as needed for DM2 (250.00)  . glucose blood test strip 1 each by Other route. one touch ultra blue test strips to check glucose qd and prn  . Lutein 20 MG CAPS Take 1 capsule by mouth daily.    . metoprolol tartrate (LOPRESSOR) 25 MG tablet Take 1 tablet (25 mg total) by mouth 2 (two) times daily.  . NON FORMULARY Take 1 tablet by mouth 4 (four) times daily. FOOD NUTRIENT COMPLEX  . NON FORMULARY Take 1 tablet by mouth 2 (two) times daily.  CELLULAR COMPLEX  . omeprazole (PRILOSEC) 20 MG capsule Take 1 capsule (20 mg total) by mouth 2 (two) times daily.  . polycarbophil (FIBERCON) 625 MG tablet Take 625 mg by mouth daily.  . potassium chloride SA (K-DUR,KLOR-CON) 20 MEQ tablet Take 1 tablet (20 mEq total) by mouth daily.  Marland Kitchen PRODIGY LANCETS 26G MISC 1 each by Does not apply route daily. To check glucose qd and prn for DM2 (250.00)  . promethazine-codeine (PHENERGAN WITH CODEINE) 6.25-10 MG/5ML syrup Take 5 mLs by mouth every 6 (six) hours as needed for cough.  . rosuvastatin (CRESTOR) 40 MG tablet Take 1 tablet (40 mg total) by mouth daily.  Marland Kitchen Specialty Vitamins Products (VARISAN  VITALITY PO) Take 1 tablet by mouth 4 (four) times daily.  . vardenafil (LEVITRA) 20 MG tablet Take 20 mg by mouth as needed.    . vitamin C (ASCORBIC ACID) 500 MG tablet Take 500 mg by mouth daily.    Review of Systems  Constitutional: Negative.   HENT: Negative.   Eyes: Negative.   Respiratory: Negative.   Cardiovascular: Negative.   Gastrointestinal: Negative.   Endocrine: Negative.   Musculoskeletal: Positive for arthralgias.  Skin: Negative.   Allergic/Immunologic: Negative.   Neurological: Negative.   Hematological: Negative.   Psychiatric/Behavioral: Negative.   All other systems reviewed and are negative.  BP 120/70  Pulse 56  Ht 5' 10.5" (1.791 m)  Wt 211 lb (95.709 kg)  BMI 29.84 kg/m2  Physical Exam  Nursing note and vitals reviewed. Constitutional: He is oriented to person, place, and time. He appears well-developed and well-nourished.  HENT:  Head: Normocephalic.  Nose: Nose normal.  Mouth/Throat: Oropharynx is clear and moist.  Eyes: Conjunctivae are normal. Pupils are equal, round, and reactive to light.  Neck: Normal range of motion. Neck supple. No JVD present.  Cardiovascular: Normal rate, regular rhythm, S1 normal, S2 normal, normal heart sounds and intact distal pulses.  Exam reveals no gallop and no friction rub.   No murmur heard. Pulmonary/Chest: Effort normal and breath sounds normal. No respiratory distress. He has no wheezes. He has no rales. He exhibits no tenderness.  Abdominal: Soft. Bowel sounds are normal. He exhibits no distension. There is no tenderness.  Musculoskeletal: Normal range of motion. He exhibits no edema and no tenderness.  Lymphadenopathy:    He has no cervical adenopathy.  Neurological: He is alert and oriented to person, place, and time. Coordination normal.  Skin: Skin is warm and dry. No rash noted. No erythema.  Psychiatric: He has a normal mood and affect. His behavior is normal. Judgment and thought content normal.       Assessment and Plan

## 2014-04-12 NOTE — Assessment & Plan Note (Signed)
He is doing exceptionally well with his diet and exercise

## 2014-04-12 NOTE — Assessment & Plan Note (Signed)
Cholesterol is at goal on the current lipid regimen. In fact his numbers are likely going to be very low given his recent weight loss. Suggested he hold the zetia with a recheck of his numbers in October 2015 with primary care on his annual visit

## 2014-04-12 NOTE — Assessment & Plan Note (Signed)
Intermittent right bundle branch block. Asymptomatic. No further workup needed

## 2014-04-12 NOTE — Assessment & Plan Note (Signed)
Blood pressure is well controlled on today's visit. No changes made to the medications. 

## 2014-04-12 NOTE — Patient Instructions (Signed)
You are doing well. Hold the zetia We will check your lipids in 06/2014  Cut the chlorthalidone in 1/2 daily, Hold it on days when you are outside, workout  Please call us if you have new issues that need to be addressed before your next appt.  Your physician wants you to follow-up in: 6 months.  You will receive a reminder letter in the mail two months in advance. If you don't receive a letter, please call our office to schedule the follow-up appointment.

## 2014-05-01 ENCOUNTER — Other Ambulatory Visit: Payer: Self-pay | Admitting: *Deleted

## 2014-05-01 MED ORDER — ROSUVASTATIN CALCIUM 40 MG PO TABS: 40.0000 mg | ORAL_TABLET | Freq: Every day | ORAL | Status: DC

## 2014-05-01 MED ORDER — ALLOPURINOL 100 MG PO TABS: 100.0000 mg | ORAL_TABLET | Freq: Two times a day (BID) | ORAL | Status: DC

## 2014-05-30 ENCOUNTER — Other Ambulatory Visit: Payer: Self-pay | Admitting: *Deleted

## 2014-05-30 MED ORDER — ALLOPURINOL 100 MG PO TABS: 100.0000 mg | ORAL_TABLET | Freq: Two times a day (BID) | ORAL | Status: DC

## 2014-05-30 MED ORDER — ROSUVASTATIN CALCIUM 40 MG PO TABS: 40.0000 mg | ORAL_TABLET | Freq: Every day | ORAL | Status: DC

## 2014-06-01 ENCOUNTER — Other Ambulatory Visit: Payer: Self-pay | Admitting: *Deleted

## 2014-06-01 MED ORDER — POTASSIUM CHLORIDE CRYS ER 20 MEQ PO TBCR: 20.0000 meq | EXTENDED_RELEASE_TABLET | Freq: Every day | ORAL | Status: DC

## 2014-06-01 MED ORDER — CHLORTHALIDONE 25 MG PO TABS: 25.0000 mg | ORAL_TABLET | Freq: Every day | ORAL | Status: DC

## 2014-06-01 MED ORDER — GLUCOSE BLOOD VI STRP: ORAL_STRIP | Status: DC

## 2014-06-19 ENCOUNTER — Ambulatory Visit (INDEPENDENT_AMBULATORY_CARE_PROVIDER_SITE_OTHER): Payer: Medicare PPO | Admitting: Family Medicine

## 2014-06-19 ENCOUNTER — Encounter: Payer: Self-pay | Admitting: Family Medicine

## 2014-06-19 VITALS — BP 136/74 | HR 72 | Temp 97.6°F | Wt 210.5 lb

## 2014-06-19 DIAGNOSIS — S8992XA Unspecified injury of left lower leg, initial encounter: Secondary | ICD-10-CM

## 2014-06-19 NOTE — Assessment & Plan Note (Addendum)
68 yo with knee injury sustained 2-3 wks ago. Knee instability limiting ability to be as active as previously. Suspicious for lateral meniscal tear on exam. Will refer to ortho for further evaluation. Encouraged continued use of knee brace. Declines pain medication. Pt agrees with plan.

## 2014-06-19 NOTE — Progress Notes (Signed)
Pre visit review using our clinic review tool, if applicable. No additional management support is needed unless otherwise documented below in the visit note. 

## 2014-06-19 NOTE — Patient Instructions (Signed)
I think you have ligament injury - possible lateral meniscal injury. Pass by Linda's office for referral to orthopedist today. Continue knee brace. May elevate leg, ice knee as well.

## 2014-06-19 NOTE — Progress Notes (Signed)
BP 136/74  Pulse 72  Temp(Src) 97.6 F (36.4 C) (Oral)  Wt 210 lb 8 oz (95.482 kg)   CC: L knee pain  Subjective:    Patient ID: Kurt Aguilar, male    DOB: Nov 28, 1945, 68 y.o.   MRN: [id number]  HPI: Kurt Aguilar is a 68 y.o. male presenting on 06/19/2014 for Knee Pain   3 wks ago while getting out of his truck twisted L knee. Has been going out on him intermittently leading to falls and near falls. Has needed to start using cane. No pain except when it gives out. Worse with prolonged standing. When knee gives out, lateral pain that radiates anterior to kneecap. No locking in place. Using knee brace which helps stability.  Hasn't tried any meds for this.  H/o R knee arthroscopy as teenager.  Has upcoming grandfather granddaughter dance he was supposed to escort his 3 granddaughters to  Limiting ability to do things he wants to do - previously golfing, swimming, walking, was very active. Wt Readings from Last 3 Encounters:  06/19/14 210 lb 8 oz (95.482 kg)  04/12/14 211 lb (95.709 kg)  02/03/14 211 lb (95.709 kg)    Relevant past medical, surgical, family and social history reviewed and updated as indicated.  Allergies and medications reviewed and updated. Current Outpatient Prescriptions on File Prior to Visit  Medication Sig  . albuterol (VENTOLIN HFA) 108 (90 BASE) MCG/ACT inhaler Inhale 2 puffs into the lungs every 4 (four) hours as needed for wheezing or shortness of breath (tight lungs ).  Marland Kitchen allopurinol (ZYLOPRIM) 100 MG tablet Take 1 tablet (100 mg total) by mouth 2 (two) times daily.  Marland Kitchen aspirin 81 MG tablet Takes 2 tablets daily.  . cetirizine (ZYRTEC) 10 MG tablet Take 10 mg by mouth as needed.   . chlorthalidone (HYGROTON) 25 MG tablet Take 1 tablet (25 mg total) by mouth daily.  . colchicine 0.6 MG tablet Take 1 tablet (0.6 mg total) by mouth 2 (two) times daily. With a meal as needed for acute flare of gout  . DHA-EPA-Vitamin E (OMEGA-3 COMPLEX PO) Take 4  tablets by mouth daily.  . Glucosamine-Chondroit-Vit C-Mn (GLUCOSAMINE 1500 COMPLEX PO) Take by mouth daily.  Marland Kitchen glucose blood test strip Check blood glucose once daily and as needed for DM2 (250.00)  . Lutein 20 MG CAPS Take 1 capsule by mouth daily.    . metoprolol tartrate (LOPRESSOR) 25 MG tablet Take 1 tablet (25 mg total) by mouth 2 (two) times daily.  . NON FORMULARY Take 1 tablet by mouth 4 (four) times daily. FOOD NUTRIENT COMPLEX  . NON FORMULARY Take 1 tablet by mouth 2 (two) times daily. CELLULAR COMPLEX  . omeprazole (PRILOSEC) 20 MG capsule Take 1 capsule (20 mg total) by mouth 2 (two) times daily.  . polycarbophil (FIBERCON) 625 MG tablet Take 625 mg by mouth daily.  Marland Kitchen PRODIGY LANCETS 26G MISC 1 each by Does not apply route daily. To check glucose qd and prn for DM2 (250.00)  . promethazine-codeine (PHENERGAN WITH CODEINE) 6.25-10 MG/5ML syrup Take 5 mLs by mouth every 6 (six) hours as needed for cough.  . rosuvastatin (CRESTOR) 40 MG tablet Take 1 tablet (40 mg total) by mouth daily.  Marland Kitchen Specialty Vitamins Products (VARISAN VITALITY PO) Take 1 tablet by mouth 2 (two) times daily.   . vardenafil (LEVITRA) 20 MG tablet Take 20 mg by mouth as needed.    . vitamin C (ASCORBIC ACID) 500 MG tablet Take 500  mg by mouth daily.  . benzonatate (TESSALON) 200 MG capsule Take 1 capsule (200 mg total) by mouth 3 (three) times daily as needed for cough. Swallow whole   No current facility-administered medications on file prior to visit.   Patient Active Problem List   Diagnosis Date Noted  . Left knee injury 06/19/2014  . Encounter for Medicare annual wellness exam 07/11/2013  . Prostate cancer screening 07/03/2013  . Right bundle branch block 02/22/2013  . Chronic low back pain 05/13/2011  . OBSTRUCTIVE SLEEP APNEA 03/15/2010  . HYPERLIPIDEMIA 04/12/2009  . GOUT 04/12/2009  . HYPERTENSION 04/12/2009  . CAD 04/12/2009  . GERD 04/12/2009  . RENAL INSUFFICIENCY 04/12/2009  . Diabetes  type 2, controlled 04/12/2009  . DIVERTICULITIS, HX OF 04/12/2009   Past Medical History  Diagnosis Date  . Diverticulitis   . Gout   . Hyperlipidemia   . Hypertension   . Osteoporosis   . GERD (gastroesophageal reflux disease)   . Atypical migraine   . Hyperglycemia    Past Surgical History  Procedure Laterality Date  . Open heart surgery  09/09/1995-1998    5 bypass  . Tonsillectomy    . Repair knee ligament      right   History  Substance Use Topics  . Smoking status: Former Smoker -- 1.00 packs/day for 3 years    Types: Cigars    Quit date: 11/25/2004  . Smokeless tobacco: Not on file     Comment: occas. cigar quit 2006  . Alcohol Use: No   Family History  Problem Relation Age of Onset  . Heart disease Mother   . Heart disease Father   . Stroke Father     Review of Systems Per HPI unless specifically indicated above    Objective:    BP 136/74  Pulse 72  Temp(Src) 97.6 F (36.4 C) (Oral)  Wt 210 lb 8 oz (95.482 kg)  Physical Exam  Nursing note and vitals reviewed. Constitutional: He appears well-developed and well-nourished. No distress.  Musculoskeletal: He exhibits no edema.  R knee WNL L Knee exam: No deformity on inspection. +pain with palpation of popliteal area, lateral joint line, and anterior knee joint Effusion/swelling of left knee present FROM in flex/extension, crepitus and popping of joint present No popliteal fullness but pain present. Neg drawer test. + mcmurray test. No pain with valgus/varus stress. No PFgrind. No abnormal patellar mobility.       Assessment & Plan:   Problem List Items Addressed This Visit   Left knee injury - Primary     68 yo with knee injury sustained 2-3 wks ago. Knee instability limiting ability to be as active as previously. Suspicious for lateral meniscal tear on exam. Will refer to ortho for further evaluation. Encouraged continued use of knee brace. Declines pain medication. Pt agrees with  plan.    Relevant Orders      Ambulatory referral to Orthopedic Surgery       Follow up plan: No Follow-up on file.

## 2014-07-17 ENCOUNTER — Encounter: Payer: Self-pay | Admitting: Orthopedic Surgery

## 2014-07-27 ENCOUNTER — Telehealth: Payer: Self-pay | Admitting: Family Medicine

## 2014-07-27 DIAGNOSIS — M1A00X0 Idiopathic chronic gout, unspecified site, without tophus (tophi): Secondary | ICD-10-CM

## 2014-07-27 DIAGNOSIS — E785 Hyperlipidemia, unspecified: Secondary | ICD-10-CM

## 2014-07-27 DIAGNOSIS — N289 Disorder of kidney and ureter, unspecified: Secondary | ICD-10-CM

## 2014-07-27 DIAGNOSIS — Z125 Encounter for screening for malignant neoplasm of prostate: Secondary | ICD-10-CM

## 2014-07-27 DIAGNOSIS — I10 Essential (primary) hypertension: Secondary | ICD-10-CM

## 2014-07-27 DIAGNOSIS — E119 Type 2 diabetes mellitus without complications: Secondary | ICD-10-CM

## 2014-07-27 NOTE — Telephone Encounter (Signed)
Opened in error

## 2014-07-27 NOTE — Telephone Encounter (Signed)
-----   Message from Ellamae Sia sent at 07/20/2014  6:32 PM EST ----- Regarding: Lab orders for Friday, 11.20.15 Patient is scheduled for CPX labs, please order future labs, Thanks , Karna Christmas

## 2014-07-28 ENCOUNTER — Other Ambulatory Visit (INDEPENDENT_AMBULATORY_CARE_PROVIDER_SITE_OTHER): Payer: Medicare PPO

## 2014-07-28 DIAGNOSIS — Z125 Encounter for screening for malignant neoplasm of prostate: Secondary | ICD-10-CM

## 2014-07-28 DIAGNOSIS — I10 Essential (primary) hypertension: Secondary | ICD-10-CM

## 2014-07-28 DIAGNOSIS — M1A00X0 Idiopathic chronic gout, unspecified site, without tophus (tophi): Secondary | ICD-10-CM

## 2014-07-28 DIAGNOSIS — N289 Disorder of kidney and ureter, unspecified: Secondary | ICD-10-CM

## 2014-07-28 DIAGNOSIS — E119 Type 2 diabetes mellitus without complications: Secondary | ICD-10-CM

## 2014-07-28 DIAGNOSIS — E785 Hyperlipidemia, unspecified: Secondary | ICD-10-CM

## 2014-07-28 LAB — COMPREHENSIVE METABOLIC PANEL
ALBUMIN: 4.2 g/dL (ref 3.5–5.2)
ALT: 24 U/L (ref 0–53)
AST: 26 U/L (ref 0–37)
Alkaline Phosphatase: 64 U/L (ref 39–117)
BUN: 31 mg/dL — ABNORMAL HIGH (ref 6–23)
CALCIUM: 10.1 mg/dL (ref 8.4–10.5)
CHLORIDE: 103 meq/L (ref 96–112)
CO2: 29 meq/L (ref 19–32)
Creatinine, Ser: 1.3 mg/dL (ref 0.4–1.5)
GFR: 58.38 mL/min — ABNORMAL LOW (ref >60.00)
GLUCOSE: 110 mg/dL — AB (ref 70–99)
Potassium: 3.8 mEq/L (ref 3.5–5.1)
Sodium: 141 mEq/L (ref 135–145)
Total Bilirubin: 1 mg/dL (ref 0.2–1.2)
Total Protein: 6.7 g/dL (ref 6.0–8.3)

## 2014-07-28 LAB — CBC WITH DIFFERENTIAL/PLATELET
BASOS ABS: 0 10*3/uL (ref 0.0–0.1)
Basophils Relative: 0.6 % (ref 0.0–3.0)
Eosinophils Absolute: 0.3 10*3/uL (ref 0.0–0.7)
Eosinophils Relative: 4 % (ref 0.0–5.0)
HCT: 44.3 % (ref 39.0–52.0)
HEMOGLOBIN: 14.6 g/dL (ref 13.0–17.0)
LYMPHS PCT: 37 % (ref 12.0–46.0)
Lymphs Abs: 2.3 10*3/uL (ref 0.7–4.0)
MCHC: 33 g/dL (ref 30.0–36.0)
MCV: 91.5 fl (ref 78.0–100.0)
Monocytes Absolute: 0.5 10*3/uL (ref 0.1–1.0)
Monocytes Relative: 8 % (ref 3.0–12.0)
Neutro Abs: 3.2 10*3/uL (ref 1.4–7.7)
Neutrophils Relative %: 50.4 % (ref 43.0–77.0)
PLATELETS: 284 10*3/uL (ref 150.0–400.0)
RBC: 4.85 Mil/uL (ref 4.22–5.81)
RDW: 13.8 % (ref 11.5–15.5)
WBC: 6.3 10*3/uL (ref 4.0–10.5)

## 2014-07-28 LAB — LIPID PANEL
CHOL/HDL RATIO: 4
Cholesterol: 187 mg/dL (ref 0–200)
HDL: 44.4 mg/dL (ref >39.00)
LDL Cholesterol: 106 mg/dL — ABNORMAL HIGH (ref 0–99)
NONHDL: 142.6
Triglycerides: 182 mg/dL — ABNORMAL HIGH (ref 0.0–149.0)
VLDL: 36.4 mg/dL (ref 0.0–40.0)

## 2014-07-28 LAB — URIC ACID: Uric Acid, Serum: 6.1 mg/dL (ref 4.0–7.8)

## 2014-07-28 LAB — HEMOGLOBIN A1C: Hgb A1c MFr Bld: 6.2 % (ref 4.6–6.5)

## 2014-07-28 LAB — PSA: PSA: 1.07 ng/mL (ref 0.10–4.00)

## 2014-07-28 LAB — TSH: TSH: 2.05 u[IU]/mL (ref 0.35–4.50)

## 2014-07-31 ENCOUNTER — Other Ambulatory Visit: Payer: Medicare PPO

## 2014-08-07 ENCOUNTER — Encounter: Payer: Self-pay | Admitting: Family Medicine

## 2014-08-07 ENCOUNTER — Ambulatory Visit (INDEPENDENT_AMBULATORY_CARE_PROVIDER_SITE_OTHER): Payer: Medicare PPO | Admitting: Family Medicine

## 2014-08-07 VITALS — BP 112/66 | HR 68 | Temp 98.1°F | Ht 70.5 in | Wt 208.2 lb

## 2014-08-07 DIAGNOSIS — Z125 Encounter for screening for malignant neoplasm of prostate: Secondary | ICD-10-CM

## 2014-08-07 DIAGNOSIS — Z0000 Encounter for general adult medical examination without abnormal findings: Secondary | ICD-10-CM

## 2014-08-07 DIAGNOSIS — E785 Hyperlipidemia, unspecified: Secondary | ICD-10-CM

## 2014-08-07 DIAGNOSIS — N289 Disorder of kidney and ureter, unspecified: Secondary | ICD-10-CM

## 2014-08-07 DIAGNOSIS — E119 Type 2 diabetes mellitus without complications: Secondary | ICD-10-CM

## 2014-08-07 DIAGNOSIS — I10 Essential (primary) hypertension: Secondary | ICD-10-CM

## 2014-08-07 DIAGNOSIS — Z23 Encounter for immunization: Secondary | ICD-10-CM

## 2014-08-07 MED ORDER — COLCHICINE 0.6 MG PO TABS: 0.6000 mg | ORAL_TABLET | Freq: Two times a day (BID) | ORAL | Status: DC | PRN

## 2014-08-07 NOTE — Assessment & Plan Note (Signed)
Reviewed health habits including diet and exercise and skin cancer prevention Reviewed appropriate screening tests for age  Also reviewed health mt list, fam hx and immunization status , as well as social and family history   See HPI prevnar vaccine today Will get Tdap at the health dept

## 2014-08-07 NOTE — Progress Notes (Signed)
Pre visit review using our clinic review tool, if applicable. No additional management support is needed unless otherwise documented below in the visit note. 

## 2014-08-07 NOTE — Assessment & Plan Note (Signed)
Lab Results  Component Value Date   HGBA1C 6.2 07/28/2014   Well controlled with diet and exercise  Will work on wt loss F/u 6 mo

## 2014-08-07 NOTE — Assessment & Plan Note (Signed)
Cr is 1.3 -down from 1.4 Stressed imp of water intake -needs to be better  Will re check 6 mo   Adv to avoid nephrotoxic meds

## 2014-08-07 NOTE — Progress Notes (Signed)
Subjective:    Patient ID: Kurt Aguilar, male    DOB: 02-29-1946, 68 y.o.   MRN: [id number]  HPI Here for annual medicare wellness exam as well as acute and chronic medical problems  I have personally reviewed the Medicare Annual Wellness questionnaire and have noted 1. The patient's medical and social history 2. Their use of alcohol, tobacco or illicit drugs 3. Their current medications and supplements 4. The patient's functional ability including ADL's, fall risks, home safety risks and hearing or visual             impairment. 5. Diet and physical activities 6. Evidence for depression or mood disorders  The patients weight, height, BMI have been recorded in the chart and visual acuity is per eye clinic.  I have made referrals, counseling and provided education to the patient based review of the above and I have provided the pt with a written personalized care plan for preventive services.  Doing well overall  Daughter had open heart surgery last weekend -- is home now - happy about that   See scanned forms.  Routine anticipatory guidance given to patient.  See health maintenance. Colon cancer screening 1/08 - was clear- 10 year recall  Flu vaccine was 9/15 at a pharmacy Tetanus vaccine 1/06  Pneumovax 11/14  Zoster vaccine 10/12 Prostate cancer screening  Lab Results  Component Value Date   PSA 1.07 07/28/2014   PSA 0.81 07/04/2013    Advance directive -has that drawn up Cognitive function addressed- see scanned forms- and if abnormal then additional documentation follows. No significant problems   PMH and SH reviewed  Meds, vitals, and allergies reviewed.   ROS: See HPI.  Otherwise negative.      wt is down 2 lb  bmi of 29  Hyperlipidemia Lab Results  Component Value Date   CHOL 187 07/28/2014   CHOL 134 07/04/2013   CHOL 120 11/24/2012   Lab Results  Component Value Date   HDL 44.40 07/28/2014   HDL 38.20* 07/04/2013   HDL 37.00* 11/24/2012   Lab  Results  Component Value Date   LDLCALC 106* 07/28/2014   LDLCALC 53 11/24/2012   LDLCALC 72 09/04/2010   Lab Results  Component Value Date   TRIG 182.0* 07/28/2014   TRIG 261.0* 07/04/2013   TRIG 149.0 11/24/2012   Lab Results  Component Value Date   CHOLHDL 4 07/28/2014   CHOLHDL 4 07/04/2013   CHOLHDL 3 11/24/2012   Lab Results  Component Value Date   LDLDIRECT 65.4 07/04/2013   LDLDIRECT 57.1 08/18/2012   LDLDIRECT 56.5 11/25/2010   crestor and diet  Last month-was not exercising -and had his knee operated on  He did change eating habits a bit / traveling and eating some junk food = and ate a lot of white clam chowder     Hyperglycemia Lab Results  Component Value Date   HGBA1C 6.2 07/28/2014   up from 6.0 Not surprised since he was eating poorly and not able to exercise yet   bp is stable today  No cp or palpitations or headaches or edema  No side effects to medicines  BP Readings from Last 3 Encounters:  08/07/14 112/66  06/19/14 136/74  04/12/14 120/70     Hx of renal insuff   Chemistry      Component Value Date/Time   NA 141 07/28/2014 0847   K 3.8 07/28/2014 0847   CL 103 07/28/2014 0847   CO2 29 07/28/2014 0847  BUN 31* 07/28/2014 0847   CREATININE 1.3 07/28/2014 0847      Component Value Date/Time   CALCIUM 10.1 07/28/2014 0847   ALKPHOS 64 07/28/2014 0847   AST 26 07/28/2014 0847   ALT 24 07/28/2014 0847   BILITOT 1.0 07/28/2014 0847     does not drink enough water -will work on that    Prostate ca screen Lab Results  Component Value Date   PSA 1.07 07/28/2014   PSA 0.81 07/04/2013   no problems with stream or nocturia  No family history    Patient Active Problem List   Diagnosis Date Noted  . Left knee injury 06/19/2014  . Encounter for Medicare annual wellness exam 07/11/2013  . Prostate cancer screening 07/03/2013  . Right bundle branch block 02/22/2013  . Chronic low back pain 05/13/2011  . OBSTRUCTIVE SLEEP APNEA  03/15/2010  . Hyperlipidemia 04/12/2009  . Gout 04/12/2009  . Essential hypertension 04/12/2009  . CAD 04/12/2009  . GERD 04/12/2009  . Renal insufficiency 04/12/2009  . Diabetes type 2, controlled 04/12/2009  . DIVERTICULITIS, HX OF 04/12/2009   Past Medical History  Diagnosis Date  . Diverticulitis   . Gout   . Hyperlipidemia   . Hypertension   . Osteoporosis   . GERD (gastroesophageal reflux disease)   . Atypical migraine   . Hyperglycemia    Past Surgical History  Procedure Laterality Date  . Open heart surgery  09/09/1995-1998    5 bypass  . Tonsillectomy    . Repair knee ligament      right   History  Substance Use Topics  . Smoking status: Former Smoker -- 1.00 packs/day for 3 years    Types: Cigars    Quit date: 11/25/2004  . Smokeless tobacco: Not on file     Comment: occas. cigar quit 2006  . Alcohol Use: No   Family History  Problem Relation Age of Onset  . Heart disease Mother   . Heart disease Father   . Stroke Father    Allergies  Allergen Reactions  . Oxycodone-Acetaminophen     Stops breathing   Current Outpatient Prescriptions on File Prior to Visit  Medication Sig Dispense Refill  . albuterol (VENTOLIN HFA) 108 (90 BASE) MCG/ACT inhaler Inhale 2 puffs into the lungs every 4 (four) hours as needed for wheezing or shortness of breath (tight lungs ). 1 Inhaler prn  . allopurinol (ZYLOPRIM) 100 MG tablet Take 1 tablet (100 mg total) by mouth 2 (two) times daily. 180 tablet 0  . aspirin 81 MG tablet Takes 2 tablets daily.    . benzonatate (TESSALON) 200 MG capsule Take 1 capsule (200 mg total) by mouth 3 (three) times daily as needed for cough. Swallow whole 30 capsule 0  . cetirizine (ZYRTEC) 10 MG tablet Take 10 mg by mouth as needed.     . chlorthalidone (HYGROTON) 25 MG tablet Take 1 tablet (25 mg total) by mouth daily. 90 tablet 0  . colchicine 0.6 MG tablet Take 1 tablet (0.6 mg total) by mouth 2 (two) times daily. With a meal as needed for  acute flare of gout 30 tablet 0  . DHA-EPA-Vitamin E (OMEGA-3 COMPLEX PO) Take 4 tablets by mouth daily.    . furosemide (LASIX) 20 MG tablet Take 10 mg by mouth daily as needed.    . Glucosamine-Chondroit-Vit C-Mn (GLUCOSAMINE 1500 COMPLEX PO) Take by mouth daily.    Marland Kitchen glucose blood test strip Check blood glucose once daily and as  needed for DM2 (250.00) 200 each 0  . Lutein 20 MG CAPS Take 1 capsule by mouth daily.      . metoprolol tartrate (LOPRESSOR) 25 MG tablet Take 1 tablet (25 mg total) by mouth 2 (two) times daily. 180 tablet 3  . NON FORMULARY Take 1 tablet by mouth 4 (four) times daily. FOOD NUTRIENT COMPLEX    . NON FORMULARY Take 1 tablet by mouth 2 (two) times daily. CELLULAR COMPLEX    . omeprazole (PRILOSEC) 20 MG capsule Take 1 capsule (20 mg total) by mouth 2 (two) times daily. 180 capsule 3  . polycarbophil (FIBERCON) 625 MG tablet Take 625 mg by mouth daily.    . potassium chloride SA (K-DUR,KLOR-CON) 20 MEQ tablet Take 10 mEq by mouth daily.    Marland Kitchen PRODIGY LANCETS 26G MISC 1 each by Does not apply route daily. To check glucose qd and prn for DM2 (250.00) 100 each 3  . promethazine-codeine (PHENERGAN WITH CODEINE) 6.25-10 MG/5ML syrup Take 5 mLs by mouth every 6 (six) hours as needed for cough. 180 mL 0  . rosuvastatin (CRESTOR) 40 MG tablet Take 1 tablet (40 mg total) by mouth daily. 90 tablet 0  . Specialty Vitamins Products (VARISAN VITALITY PO) Take 1 tablet by mouth 2 (two) times daily.     . vardenafil (LEVITRA) 20 MG tablet Take 20 mg by mouth as needed.      . vitamin C (ASCORBIC ACID) 500 MG tablet Take 500 mg by mouth daily.     No current facility-administered medications on file prior to visit.     Review of Systems Review of Systems  Constitutional: Negative for fever, appetite change, fatigue and unexpected weight change.  Eyes: Negative for pain and visual disturbance.  Respiratory: Negative for cough and shortness of breath.   Cardiovascular: Negative for  cp or palpitations    Gastrointestinal: Negative for nausea, diarrhea and constipation.  Genitourinary: Negative for urgency and frequency.  Skin: Negative for pallor or rash   Neurological: Negative for weakness, light-headedness, numbness and headaches.  Hematological: Negative for adenopathy. Does not bruise/bleed easily.  Psychiatric/Behavioral: Negative for dysphoric mood. The patient is not nervous/anxious.         Objective:   Physical Exam  Constitutional: He appears well-developed and well-nourished. No distress.  obese and well appearing   HENT:  Head: Normocephalic and atraumatic.  Right Ear: External ear normal.  Left Ear: External ear normal.  Nose: Nose normal.  Mouth/Throat: Oropharynx is clear and moist.  Eyes: Conjunctivae and EOM are normal. Pupils are equal, round, and reactive to light. Right eye exhibits no discharge. Left eye exhibits no discharge. No scleral icterus.  Neck: Normal range of motion. Neck supple. No JVD present. Carotid bruit is not present. No thyromegaly present.  Cardiovascular: Normal rate, regular rhythm, normal heart sounds and intact distal pulses.  Exam reveals no gallop.   Pulmonary/Chest: Effort normal and breath sounds normal. No respiratory distress. He has no wheezes. He exhibits no tenderness.  Abdominal: Soft. Bowel sounds are normal. He exhibits no distension, no abdominal bruit and no mass. There is no tenderness.  Musculoskeletal: He exhibits no edema or tenderness.  Lymphadenopathy:    He has no cervical adenopathy.  Neurological: He is alert. He has normal reflexes. No cranial nerve deficit. He exhibits normal muscle tone. Coordination normal.  Skin: Skin is warm and dry. No rash noted. No erythema. No pallor.  Psychiatric: He has a normal mood and affect.  Assessment & Plan:   Problem List Items Addressed This Visit      Cardiovascular and Mediastinum   Essential hypertension    bp in fair control at this  time  BP Readings from Last 1 Encounters:  08/07/14 112/66   No changes needed Disc lifstyle change with low sodium diet and exercise   Labs rev in detail       Endocrine   Diabetes type 2, controlled    Lab Results  Component Value Date   HGBA1C 6.2 07/28/2014   Well controlled with diet and exercise  Will work on wt loss F/u 6 mo       Genitourinary   Renal insufficiency    Cr is 1.3 -down from 1.4 Stressed imp of water intake -needs to be better  Will re check 6 mo   Adv to avoid nephrotoxic meds       Other   Encounter for Medicare annual wellness exam - Primary    Reviewed health habits including diet and exercise and skin cancer prevention Reviewed appropriate screening tests for age  Also reviewed health mt list, fam hx and immunization status , as well as social and family history   See HPI prevnar vaccine today Will get Tdap at the health dept     Hyperlipidemia    Disc goals for lipids and reasons to control them Rev labs with pt Rev low sat fat diet in detail Fair control but up from prev - bad diet and less exercise with knee surgery Will work on this Continue crestor Lab and f/u 6 mo     Prostate cancer screening    Lab Results  Component Value Date   PSA 1.07 07/28/2014   PSA 0.81 07/04/2013    No symptoms No family hx  Will continue to follow      Other Visit Diagnoses    Need for vaccination with 13-polyvalent pneumococcal conjugate vaccine        Relevant Orders       Pneumococcal conjugate vaccine 13-valent (Completed)

## 2014-08-07 NOTE — Assessment & Plan Note (Signed)
Lab Results  Component Value Date   PSA 1.07 07/28/2014   PSA 0.81 07/04/2013    No symptoms No family hx  Will continue to follow

## 2014-08-07 NOTE — Patient Instructions (Signed)
Drink more water - at least 8-12 servings per day for kidney health and to prevent gout attacks  prevnar vaccine today  Get a tetanus (Tdap) vaccine [facility] the [facility] in 2016 Eat a lower cholesterol diet   Avoid red meat/ fried foods/ egg yolks/ fatty breakfast meats/ butter, cheese and high fat dairy/ and shellfish   Follow up in 6 months with labs prior    Fat and Cholesterol Control Diet Fat and cholesterol levels in your blood and organs are influenced by your diet. High levels of fat and cholesterol may lead to diseases of the heart, small and large blood vessels, gallbladder, liver, and pancreas. CONTROLLING FAT AND CHOLESTEROL WITH DIET Although exercise and lifestyle factors are important, your diet is key. That is because certain foods are known to raise cholesterol and others to lower it. The goal is to balance foods for their effect on cholesterol and more importantly, to replace saturated and trans fat with other types of fat, such as monounsaturated fat, polyunsaturated fat, and omega-3 fatty acids. On average, a person should consume no more than 15 to 17 g of saturated fat daily. Saturated and trans fats are considered "bad" fats, and they will raise LDL cholesterol. Saturated fats are primarily found in animal products such as meats, butter, and cream. However, that does not mean you need to give up all your favorite foods. Today, there are good tasting, low-fat, low-cholesterol substitutes for most of the things you like to eat. Choose low-fat or nonfat alternatives. Choose round or loin cuts of red meat. These types of cuts are lowest in fat and cholesterol. Chicken (without the skin), fish, veal, and ground [place] breast are great choices. Eliminate fatty meats, such as hot dogs and salami. Even shellfish have little or no saturated fat. Have a 3 oz (85 g) portion when you eat lean meat, poultry, or fish. Trans fats are also called "partially hydrogenated oils." They are oils  that have been scientifically manipulated so that they are solid at room temperature resulting in a longer shelf life and improved taste and texture of foods in which they are added. Trans fats are found in stick margarine, some tub margarines, cookies, crackers, and baked goods.  When baking and cooking, oils are a great substitute for butter. The monounsaturated oils are especially beneficial since it is believed they lower LDL and raise HDL. The oils you should avoid entirely are saturated tropical oils, such as coconut and palm.  Remember to eat a lot from food groups that are naturally free of saturated and trans fat, including fish, fruit, vegetables, beans, grains (barley, rice, couscous, bulgur wheat), and pasta (without cream sauces).  IDENTIFYING FOODS THAT LOWER FAT AND CHOLESTEROL  Soluble fiber may lower your cholesterol. This type of fiber is found in fruits such as apples, vegetables such as broccoli, potatoes, and carrots, legumes such as beans, peas, and lentils, and grains such as barley. Foods fortified with plant sterols (phytosterol) may also lower cholesterol. You should eat at least 2 g per day of these foods for a cholesterol lowering effect.  Read package labels to identify low-saturated fats, trans fat free, and low-fat foods at the supermarket. Select cheeses that have only 2 to 3 g saturated fat per ounce. Use a heart-healthy tub margarine that is free of trans fats or partially hydrogenated oil. When buying baked goods (cookies, crackers), avoid partially hydrogenated oils. Breads and muffins should be made from whole grains (whole-wheat or whole oat flour,  instead of "flour" or "enriched flour"). Buy non-creamy canned soups with reduced salt and no added fats.  FOOD PREPARATION TECHNIQUES  Never deep-fry. If you must fry, either stir-fry, which uses very little fat, or use non-stick cooking sprays. When possible, broil, bake, or roast meats, and steam vegetables. Instead of  putting butter or margarine on vegetables, use lemon and herbs, applesauce, and cinnamon (for squash and sweet potatoes). Use nonfat yogurt, salsa, and low-fat dressings for salads.  LOW-SATURATED FAT / LOW-FAT FOOD SUBSTITUTES Meats / Saturated Fat (g)  Avoid: Steak, marbled (3 oz/85 g) / 11 g  Choose: Steak, lean (3 oz/85 g) / 4 g  Avoid: Hamburger (3 oz/85 g) / 7 g  Choose: Hamburger, lean (3 oz/85 g) / 5 g  Avoid: Ham (3 oz/85 g) / 6 g  Choose: Ham, lean cut (3 oz/85 g) / 2.4 g  Avoid: Chicken, with skin, dark meat (3 oz/85 g) / 4 g  Choose: Chicken, skin removed, dark meat (3 oz/85 g) / 2 g  Avoid: Chicken, with skin, light meat (3 oz/85 g) / 2.5 g  Choose: Chicken, skin removed, light meat (3 oz/85 g) / 1 g Dairy / Saturated Fat (g)  Avoid: Whole milk (1 cup) / 5 g  Choose: Low-fat milk, 2% (1 cup) / 3 g  Choose: Low-fat milk, 1% (1 cup) / 1.5 g  Choose: Skim milk (1 cup) / 0.3 g  Avoid: Hard cheese (1 oz/28 g) / 6 g  Choose: Skim milk cheese (1 oz/28 g) / 2 to 3 g  Avoid: Cottage cheese, 4% fat (1 cup) / 6.5 g  Choose: Low-fat cottage cheese, 1% fat (1 cup) / 1.5 g  Avoid: Ice cream (1 cup) / 9 g  Choose: Sherbet (1 cup) / 2.5 g  Choose: Nonfat frozen yogurt (1 cup) / 0.3 g  Choose: Frozen fruit bar / trace  Avoid: Whipped cream (1 tbs) / 3.5 g  Choose: Nondairy whipped topping (1 tbs) / 1 g Condiments / Saturated Fat (g)  Avoid: Mayonnaise (1 tbs) / 2 g  Choose: Low-fat mayonnaise (1 tbs) / 1 g  Avoid: Butter (1 tbs) / 7 g  Choose: Extra light margarine (1 tbs) / 1 g  Avoid: Coconut oil (1 tbs) / 11.8 g  Choose: Olive oil (1 tbs) / 1.8 g  Choose: Corn oil (1 tbs) / 1.7 g  Choose: Safflower oil (1 tbs) / 1.2 g  Choose: Sunflower oil (1 tbs) / 1.4 g  Choose: Soybean oil (1 tbs) / 2.4 g  Choose: Canola oil (1 tbs) / 1 g Document Released: 08/25/2005 Document Revised: 12/20/2012 Document Reviewed: 11/23/2013 [employer] Patient Information  2015 [place], [place]. This information is not intended to replace advice given to you by your health care provider. Make sure you discuss any questions you have with your health care provider.

## 2014-08-07 NOTE — Assessment & Plan Note (Signed)
bp in fair control at this time  BP Readings from Last 1 Encounters:  08/07/14 112/66   No changes needed Disc lifstyle change with low sodium diet and exercise   Labs rev in detail

## 2014-08-07 NOTE — Assessment & Plan Note (Signed)
Disc goals for lipids and reasons to control them Rev labs with pt Rev low sat fat diet in detail Fair control but up from prev - bad diet and less exercise with knee surgery Will work on this Continue crestor Lab and f/u 6 mo

## 2014-08-08 ENCOUNTER — Telehealth: Payer: Self-pay | Admitting: Family Medicine

## 2014-08-08 ENCOUNTER — Encounter: Payer: Self-pay | Admitting: Orthopedic Surgery

## 2014-08-08 NOTE — Telephone Encounter (Signed)
emmi emailed °

## 2014-08-29 ENCOUNTER — Other Ambulatory Visit: Payer: Self-pay | Admitting: Family Medicine

## 2014-11-22 ENCOUNTER — Ambulatory Visit (INDEPENDENT_AMBULATORY_CARE_PROVIDER_SITE_OTHER): Payer: PPO | Admitting: Cardiovascular Disease

## 2014-11-22 ENCOUNTER — Encounter: Payer: Self-pay | Admitting: Cardiovascular Disease

## 2014-11-22 VITALS — BP 110/68 | HR 68 | Ht 70.5 in | Wt 219.6 lb

## 2014-11-22 DIAGNOSIS — R0602 Shortness of breath: Secondary | ICD-10-CM

## 2014-11-22 DIAGNOSIS — I10 Essential (primary) hypertension: Secondary | ICD-10-CM

## 2014-11-22 DIAGNOSIS — E785 Hyperlipidemia, unspecified: Secondary | ICD-10-CM

## 2014-11-22 DIAGNOSIS — I25810 Atherosclerosis of coronary artery bypass graft(s) without angina pectoris: Secondary | ICD-10-CM

## 2014-11-22 DIAGNOSIS — E119 Type 2 diabetes mellitus without complications: Secondary | ICD-10-CM

## 2014-11-22 DIAGNOSIS — F4321 Adjustment disorder with depressed mood: Secondary | ICD-10-CM

## 2014-11-22 MED ORDER — ROSUVASTATIN CALCIUM 40 MG PO TABS: 40.0000 mg | ORAL_TABLET | Freq: Every day | ORAL | Status: DC

## 2014-11-22 NOTE — Assessment & Plan Note (Signed)
We have encouraged continued exercise, careful diet management in an effort to lose weight. 

## 2014-11-22 NOTE — Assessment & Plan Note (Signed)
Blood pressure is well controlled on today's visit. No changes made to the medications. 

## 2014-11-22 NOTE — Assessment & Plan Note (Signed)
Recent loss of his son to cardiac arrest/acute MI. Difficulty sleeping at nighttime. Recommended he try to start a walking program with his wife

## 2014-11-22 NOTE — Assessment & Plan Note (Signed)
Currently with no symptoms of angina. No further workup at this time. Continue current medication regimen. 

## 2014-11-22 NOTE — Patient Instructions (Signed)
You are doing well.  Please restart zetia one a day  Please call us if you have new issues that need to be addressed before your next appt.  Your physician wants you to follow-up in: 6 months.  You will receive a reminder letter in the mail two months in advance. If you don't receive a letter, please call our office to schedule the follow-up appointment.

## 2014-11-22 NOTE — Progress Notes (Signed)
Patient ID: Kurt Aguilar, male    DOB: 06/07/1946, 69 y.o.   MRN: [id number]  HPI Comments: 69 yo with history of CAD CABG back in 1997 in [place], [place], HTN, hyperlipidemia, presents for routine followup of his coronary artery disease. EKGs show intermittent right bundle branch block. He is asymptomatic History of gout. Reports baseline creatinine mildly elevated  In follow-up today, he reports that he lost his son to cardiac arrest in the past month. Having significant problems with sleep. Not exercising over the past several months secondary to left knee surgery, residual pain. Also had bronchitis February 2016 Previously was exercising on a regular basis, not recently. Weight is up approximately 15 pounds He also stopped taking his Zetia in 2015. Cholesterol since then has been climbing above goal.  EKG shows normal sinus rhythm with right bundle branch block, rate 68 bpm  Previous Hemoglobin A1c 6.1, total cholesterol 134  stress test in March of 2011 shows mildly decreased perfusion in the anterior wall at rest with moderately decreased perfusion with stress. He had no significant symptoms with testing.  Allergies  Allergen Reactions  . Oxycodone-Acetaminophen     Stops breathing    Outpatient Encounter Prescriptions as of 11/22/2014  Medication Sig  . albuterol (VENTOLIN HFA) 108 (90 BASE) MCG/ACT inhaler Inhale 2 puffs into the lungs every 4 (four) hours as needed for wheezing or shortness of breath (tight lungs ).  Marland Kitchen allopurinol (ZYLOPRIM) 100 MG tablet TAKE 1 TABLET TWICE DAILY  . aspirin 81 MG tablet Takes 2 tablets daily.  . benzonatate (TESSALON) 200 MG capsule Take 1 capsule (200 mg total) by mouth 3 (three) times daily as needed for cough. Swallow whole  . cetirizine (ZYRTEC) 10 MG tablet Take 10 mg by mouth as needed.   . chlorthalidone (HYGROTON) 25 MG tablet TAKE 1 TABLET EVERY DAY  . colchicine 0.6 MG tablet Take 1 tablet (0.6 mg total) by mouth 2 (two) times  daily as needed. With a meal as needed for acute flare of gout  . DHA-EPA-Vitamin E (OMEGA-3 COMPLEX PO) Take 4 tablets by mouth daily.  . furosemide (LASIX) 20 MG tablet Take 10 mg by mouth daily as needed.  . Glucosamine-Chondroit-Vit C-Mn (GLUCOSAMINE 1500 COMPLEX PO) Take by mouth daily.  . Lutein 20 MG CAPS Take 1 capsule by mouth daily.    . metoprolol tartrate (LOPRESSOR) 25 MG tablet TAKE 1 TABLET 2 TIMES DAILY.  . NON FORMULARY Take 1 tablet by mouth 4 (four) times daily. FOOD NUTRIENT COMPLEX  . NON FORMULARY Take 1 tablet by mouth 2 (two) times daily. CELLULAR COMPLEX  . omeprazole (PRILOSEC) 20 MG capsule TAKE 1 CAPSULE TWICE DAILY  . polycarbophil (FIBERCON) 625 MG tablet Take 625 mg by mouth daily.  . potassium chloride SA (K-DUR,KLOR-CON) 20 MEQ tablet TAKE 1 TABLET EVERY DAY  . PRODIGY LANCETS 26G MISC 1 each by Does not apply route daily. To check glucose qd and prn for DM2 (250.00)  . PRODIGY NO CODING BLOOD GLUC test strip CHECK  BLOOD  GLUCOSE ONE TIME DAILY  AND AS NEEDED  . promethazine-codeine (PHENERGAN WITH CODEINE) 6.25-10 MG/5ML syrup Take 5 mLs by mouth every 6 (six) hours as needed for cough.  . rosuvastatin (CRESTOR) 40 MG tablet Take 1 tablet (40 mg total) by mouth daily.  Marland Kitchen Specialty Vitamins Products (VARISAN VITALITY PO) Take 1 tablet by mouth 2 (two) times daily.   . vardenafil (LEVITRA) 20 MG tablet Take 20 mg by mouth  as needed.    . vitamin C (ASCORBIC ACID) 500 MG tablet Take 500 mg by mouth daily.  . [DISCONTINUED] CRESTOR 40 MG tablet TAKE 1 TABLET EVERY DAY    Past Medical History  Diagnosis Date  . Diverticulitis   . Gout   . Hyperlipidemia   . Hypertension   . Osteoporosis   . GERD (gastroesophageal reflux disease)   . Atypical migraine   . Hyperglycemia     Past Surgical History  Procedure Laterality Date  . Open heart surgery  09/09/1995-1998    5 bypass  . Tonsillectomy    . Repair knee ligament      right    Social History   reports that he quit smoking about 9 years ago. His smoking use included Cigars. He does not have any smokeless tobacco history on file. He reports that he does not drink alcohol or use illicit drugs.  Family History family history includes Heart attack (age of onset: 87) in his son; Heart disease in his father and mother; Stroke in his father.   Review of Systems  Constitutional: Negative.   Respiratory: Negative.   Cardiovascular: Negative.   Gastrointestinal: Negative.   Endocrine: Negative.   Musculoskeletal: Positive for arthralgias.  Skin: Negative.   Neurological: Negative.   Hematological: Negative.   Psychiatric/Behavioral: Negative.   All other systems reviewed and are negative.  BP 110/68 mmHg  Pulse 68  Ht 5' 10.5" (1.791 m)  Wt 219 lb 9 oz (99.593 kg)  BMI 31.05 kg/m2  Physical Exam  Constitutional: He is oriented to person, place, and time. He appears well-developed and well-nourished.  HENT:  Head: Normocephalic.  Nose: Nose normal.  Mouth/Throat: Oropharynx is clear and moist.  Eyes: Conjunctivae are normal. Pupils are equal, round, and reactive to light.  Neck: Normal range of motion. Neck supple. No JVD present.  Cardiovascular: Normal rate, regular rhythm, S1 normal, S2 normal, normal heart sounds and intact distal pulses.  Exam reveals no gallop and no friction rub.   No murmur heard. Pulmonary/Chest: Effort normal and breath sounds normal. No respiratory distress. He has no wheezes. He has no rales. He exhibits no tenderness.  Abdominal: Soft. Bowel sounds are normal. He exhibits no distension. There is no tenderness.  Musculoskeletal: Normal range of motion. He exhibits no edema or tenderness.  Lymphadenopathy:    He has no cervical adenopathy.  Neurological: He is alert and oriented to person, place, and time. Coordination normal.  Skin: Skin is warm and dry. No rash noted. No erythema.  Psychiatric: He has a normal mood and affect. His behavior is  normal. Judgment and thought content normal.      Assessment and Plan   Nursing note and vitals reviewed.

## 2014-11-22 NOTE — Assessment & Plan Note (Signed)
Cholesterol has climbed above goal. LDL less than 70, total cholesterol less than 150. Likely even worse now with recent weight gain. Recommended he restart his zetia 10 mg daily, recheck cholesterol in the end of summer

## 2014-12-22 ENCOUNTER — Other Ambulatory Visit: Payer: Self-pay

## 2014-12-22 MED ORDER — OMEPRAZOLE 20 MG PO CPDR: 20.0000 mg | DELAYED_RELEASE_CAPSULE | Freq: Two times a day (BID) | ORAL | Status: DC

## 2014-12-22 NOTE — Telephone Encounter (Signed)
[facility] left v[facility]m requesting refill omeprazole. Done.spoke with pt and he is wanting to go to [address] instead of [place].

## 2015-01-26 ENCOUNTER — Other Ambulatory Visit: Payer: Self-pay

## 2015-01-26 MED ORDER — METOPROLOL TARTRATE 25 MG PO TABS: ORAL_TABLET | ORAL | Status: DC

## 2015-01-26 MED ORDER — ALLOPURINOL 100 MG PO TABS: 100.0000 mg | ORAL_TABLET | Freq: Two times a day (BID) | ORAL | Status: DC

## 2015-01-26 NOTE — Telephone Encounter (Signed)
Kurt Aguilar from [address] left v/m requesting refill allopurinol and metoprolol. Spoke with pt and he no longer is using [facility]; [facility] is aware no long pts pharmacy. Pt request remaining refills sent to [facility]. Advised done.

## 2015-02-01 ENCOUNTER — Ambulatory Visit (INDEPENDENT_AMBULATORY_CARE_PROVIDER_SITE_OTHER): Payer: PPO | Admitting: Internal Medicine

## 2015-02-01 ENCOUNTER — Encounter: Payer: Self-pay | Admitting: Internal Medicine

## 2015-02-01 VITALS — BP 122/66 | HR 65 | Ht 70.0 in | Wt 234.0 lb

## 2015-02-01 DIAGNOSIS — G4733 Obstructive sleep apnea (adult) (pediatric): Secondary | ICD-10-CM

## 2015-02-01 NOTE — Progress Notes (Signed)
10/03/11- 25 yoM former smoker followed for OSA Wife here LOV 10/01/10 She returns for one year followup, still fully compliant with CPAP 11/[facility]. He says he sleeps well through the night. He likes his current fullface mask and humidifier. His wife notes only minimal snoring without witnessed apnea. He feels well rested and sees no need for any changes.  10/04/12-67 yoM former smoker followed for OSA           Wife here FOLLOWS FOR: wears CPAP 13/ [facility] every night for about 6 hours; pressure working well for patient at this. Full face mask. We had changed pressure from 11 in July, 2013, due to a leak. Now diagnosed with diabetes, diet-controlled, trying to lose weight. Weight loss would help sleep apnea as explained again.  09/15/13- 67 yoM former smoker followed for OSA, bronchitis, complicated by DM          Wife here FOLLOWS FOR: Currently using CPAP 13/ [facility] machine for 6 hours per night. Denies problems with machine, mask or pressure. Had bronchitis in October, relieved by Z-Pak. Now again 2 weeks of acute bronchitis with cough productive green but denies fever or sore throat. Some wheeze. Now diagnosed with diabetes and trying to avoid prednisone.  02/01/15- 67 yoM former smoker followed for OSA, bronchitis, complicated by DM          Wife here Report: Wears CPAP 13/ [facility] nightly 6hrs, snoring through the mask The way the exhaust air vents from his mask bothers his wife. He was off CPAP for 3 weeks on a trip to [place] and admits he sleeps better with it.  ROS-see HPI Constitutional:   No-   weight loss, night sweats, fevers, chills, fatigue, lassitude. HEENT:   No-  headaches, difficulty swallowing, tooth/dental problems, sore throat,       No-  sneezing, itching, ear ache, nasal congestion, post nasal drip,  CV:  No-   chest pain, orthopnea, PND, swelling in lower extremities, anasarca, dizziness, palpitations Resp: No-   shortness of breath with exertion or at rest.              +  productive cough,  No non-productive cough,  No- coughing up of blood.              No-   change in color of mucus.  No- wheezing.   Skin: No-   rash or lesions. GI:  No-   heartburn, indigestion, abdominal pain, nausea, vomiting,  GU:  MS:  No-   joint pain or swelling. . Neuro-     nothing unusual Psych:  No- change in mood or affect. No depression or anxiety.  No memory loss.  OBJ General- Alert, Oriented, Affect-appropriate, Distress- none acute Skin- rash-none, lesions- none, excoriation- none Lymphadenopathy- none Head- atraumatic            Eyes- Gross vision intact, PERRLA, conjunctivae clear secretions            Ears- Hearing aid+            Nose- Clear, no-Septal dev, mucus, polyps, erosion, perforation             Throat- Mallampati II , mucosa clear , drainage- none, tonsils- atrophic Neck- flexible , trachea midline, no stridor , thyroid nl, carotid no bruit Chest - symmetrical excursion , unlabored           Heart/CV- RRR , no murmur , no gallop  , no rub, nl s1 s2                           -  JVD- none , edema- none, stasis changes- none, varices- none           Lung- clear to P&A, wheeze- none, cough- none , dullness-none, rub- none           Chest wall-  Abd- Br/ Gen/ Rectal- Not done, not indicated Extrem- cyanosis- none, clubbing, none, atrophy- none, strength- nl Neuro- grossly intact to observation

## 2015-02-01 NOTE — Patient Instructions (Signed)
Order- [facility]uey Romans increase  CPAP to 15 cwp           Dx OSA            - please work with patient on mask style and fit to reduce bothersome venting           - download for pressure compliance on 15 cwp  Please call as needed

## 2015-02-03 NOTE — Assessment & Plan Note (Signed)
Cardiac problems managed by cardiology. We have discussed the interaction between obstructive sleep apnea and heart disease/hypertension

## 2015-02-03 NOTE — Assessment & Plan Note (Signed)
With weight gain, he is snoring through his mask at 13 so we are going to increased pressure to 15/[facility] and get download. He is going to discuss mask styles with Huey Romans

## 2015-02-09 ENCOUNTER — Telehealth: Payer: Self-pay | Admitting: Internal Medicine

## 2015-02-09 DIAGNOSIS — G4733 Obstructive sleep apnea (adult) (pediatric): Secondary | ICD-10-CM

## 2015-02-09 NOTE — Telephone Encounter (Signed)
Spoke with pt. He is needing a new CPAP machine. Order will be placed. Nothing further was needed.

## 2015-02-27 ENCOUNTER — Telehealth: Payer: Self-pay | Admitting: Internal Medicine

## 2015-02-27 DIAGNOSIS — G4733 Obstructive sleep apnea (adult) (pediatric): Secondary | ICD-10-CM

## 2015-02-27 NOTE — Telephone Encounter (Signed)
Pt is aware of DL showing he could use increase in CPAP pressure from 15 to 17 and agrees. Order has been placed.

## 2015-04-09 ENCOUNTER — Telehealth: Payer: Self-pay

## 2015-04-09 MED ORDER — EZETIMIBE 10 MG PO TABS: 10.0000 mg | ORAL_TABLET | Freq: Every day | ORAL | Status: DC

## 2015-04-09 NOTE — Telephone Encounter (Signed)
Pt would like Zetia rx sent [facility] in [place]

## 2015-04-09 NOTE — Telephone Encounter (Signed)
Refill sent for zetia.

## 2015-05-11 LAB — HM SIGMOIDOSCOPY: HM SIGMOIDOSCOPY: NORMAL

## 2015-05-11 LAB — HM DIABETES EYE EXAM

## 2015-06-08 ENCOUNTER — Encounter: Payer: Self-pay | Admitting: Cardiovascular Disease

## 2015-06-08 ENCOUNTER — Ambulatory Visit (INDEPENDENT_AMBULATORY_CARE_PROVIDER_SITE_OTHER): Payer: PPO | Admitting: Cardiovascular Disease

## 2015-06-08 VITALS — BP 120/68 | HR 86 | Ht 70.5 in | Wt 238.8 lb

## 2015-06-08 DIAGNOSIS — E119 Type 2 diabetes mellitus without complications: Secondary | ICD-10-CM

## 2015-06-08 DIAGNOSIS — I4510 Unspecified right bundle-branch block: Secondary | ICD-10-CM | POA: Diagnosis not present

## 2015-06-08 DIAGNOSIS — G4733 Obstructive sleep apnea (adult) (pediatric): Secondary | ICD-10-CM

## 2015-06-08 DIAGNOSIS — I10 Essential (primary) hypertension: Secondary | ICD-10-CM | POA: Diagnosis not present

## 2015-06-08 DIAGNOSIS — I25810 Atherosclerosis of coronary artery bypass graft(s) without angina pectoris: Secondary | ICD-10-CM

## 2015-06-08 DIAGNOSIS — E785 Hyperlipidemia, unspecified: Secondary | ICD-10-CM

## 2015-06-08 DIAGNOSIS — I2510 Atherosclerotic heart disease of native coronary artery without angina pectoris: Secondary | ICD-10-CM

## 2015-06-08 NOTE — Assessment & Plan Note (Signed)
Currently with no symptoms of angina. No further workup at this time. Continue current medication regimen. 

## 2015-06-08 NOTE — Assessment & Plan Note (Signed)
20 pound weight gain since March 2016 secondary to poor diet. We have encouraged continued exercise, careful diet management in an effort to lose weight.

## 2015-06-08 NOTE — Patient Instructions (Addendum)
You are doing well. No medication changes were made.  Please call us if you have new issues that need to be addressed before your next appt.  Your physician wants you to follow-up in: 6 months.  You will receive a reminder letter in the mail two months in advance. If you don't receive a letter, please call our office to schedule the follow-up appointment.   

## 2015-06-08 NOTE — Assessment & Plan Note (Signed)
We have stressed the importance of aggressive diabetes control, aggressive cholesterol control. Weight of 20 pounds. Ideally goal LDL less than 70 Currently with no symptoms of angina

## 2015-06-08 NOTE — Assessment & Plan Note (Signed)
Blood pressure is well controlled on today's visit. No changes made to the medications. 

## 2015-06-08 NOTE — Progress Notes (Signed)
Patient ID: Kurt Aguilar, male    DOB: [birth year], 69 y.o.   MRN: [id number]  HPI Comments: 69 yo with history of CAD,  CABG back in 70 in [place], [place], HTN, hyperlipidemia, presents for routine followup of his coronary artery disease. EKGs show intermittent right bundle branch block. He is asymptomatic History of gout. Reports baseline creatinine mildly elevated He lost his son in early 2016 to cardiac arrest   In follow-up today, he recently spent 2 weeks in [place] him a got back earlier this week. No dietary restrictions, drink lots of beer, lots of desserts. Weight is up post to 20 pounds from his prior clinic visit earlier in the year. He's not been exercising. No recent lab work available He is taking Crestor and Zetia daily. Denies any symptoms concerning for angina   EKG on today's visit shows normal sinus rhythm with rate 86 bpm, right bundle branch block  Other past medical history  Most recent total cholesterol in 2015 was 180  stress test in March of 2011 shows mildly decreased perfusion in the anterior wall at rest with moderately decreased perfusion with stress. He had no significant symptoms with testing.  Allergies  Allergen Reactions  . Oxycodone-Acetaminophen     Stops breathing    Outpatient Encounter Prescriptions as of 06/08/2015  Medication Sig  . albuterol (VENTOLIN HFA) 108 (90 BASE) MCG/ACT inhaler Inhale 2 puffs into the lungs every 4 (four) hours as needed for wheezing or shortness of breath (tight lungs ).  Marland Kitchen allopurinol (ZYLOPRIM) 100 MG tablet Take 1 tablet (100 mg total) by mouth 2 (two) times daily.  Marland Kitchen aspirin 81 MG tablet Takes 2 tablets daily.  . benzonatate (TESSALON) 200 MG capsule Take 1 capsule (200 mg total) by mouth 3 (three) times daily as needed for cough. Swallow whole  . cetirizine (ZYRTEC) 10 MG tablet Take 10 mg by mouth as needed.   . chlorthalidone (HYGROTON) 25 MG tablet TAKE 1 TABLET EVERY DAY  . colchicine 0.6 MG tablet Take  1 tablet (0.6 mg total) by mouth 2 (two) times daily as needed. With a meal as needed for acute flare of gout  . DHA-EPA-Vitamin E (OMEGA-3 COMPLEX PO) Take 4 tablets by mouth daily.  Marland Kitchen ezetimibe (ZETIA) 10 MG tablet Take 1 tablet (10 mg total) by mouth daily.  . furosemide (LASIX) 20 MG tablet Take 10 mg by mouth daily as needed.  . Glucosamine-Chondroit-Vit C-Mn (GLUCOSAMINE 1500 COMPLEX PO) Take by mouth daily.  . Lutein 20 MG CAPS Take 1 capsule by mouth daily.    . metoprolol tartrate (LOPRESSOR) 25 MG tablet TAKE 1 TABLET 2 TIMES DAILY.  . NON FORMULARY Take 1 tablet by mouth 4 (four) times daily. FOOD NUTRIENT COMPLEX  . NON FORMULARY Take 1 tablet by mouth 2 (two) times daily. CELLULAR COMPLEX  . omeprazole (PRILOSEC) 20 MG capsule Take 1 capsule (20 mg total) by mouth 2 (two) times daily.  . polycarbophil (FIBERCON) 625 MG tablet Take 625 mg by mouth daily.  . potassium chloride SA (K-DUR,KLOR-CON) 20 MEQ tablet TAKE 1 TABLET EVERY DAY  . PRODIGY LANCETS 26G MISC 1 each by Does not apply route daily. To check glucose qd and prn for DM2 (250.00)  . PRODIGY NO CODING BLOOD GLUC test strip CHECK  BLOOD  GLUCOSE ONE TIME DAILY  AND AS NEEDED  . promethazine-codeine (PHENERGAN WITH CODEINE) 6.25-10 MG/5ML syrup Take 5 mLs by mouth every 6 (six) hours as needed for cough.  Marland Kitchen  rosuvastatin (CRESTOR) 40 MG tablet Take 1 tablet (40 mg total) by mouth daily.  Marland Kitchen Specialty Vitamins Products (VARISAN VITALITY PO) Take 1 tablet by mouth 2 (two) times daily.   . vardenafil (LEVITRA) 20 MG tablet Take 20 mg by mouth as needed.    . vitamin C (ASCORBIC ACID) 500 MG tablet Take 500 mg by mouth daily.   No facility-administered encounter medications on file as of 06/08/2015.    Past Medical History  Diagnosis Date  . Diverticulitis   . Gout   . Hyperlipidemia   . Hypertension   . Osteoporosis   . GERD (gastroesophageal reflux disease)   . Atypical migraine   . Hyperglycemia     Past Surgical  History  Procedure Laterality Date  . Open heart surgery  09/09/1995-1998    5 bypass  . Tonsillectomy    . Repair knee ligament      right    Social History  reports that he quit smoking about 10 years ago. His smoking use included Cigars. He does not have any smokeless tobacco history on file. He reports that he does not drink alcohol or use illicit drugs.  Family History family history includes Heart attack (age of onset: 45) in his son; Heart disease in his father and mother; Stroke in his father.   Review of Systems  Constitutional: Positive for unexpected weight change.  Respiratory: Negative.   Cardiovascular: Negative.   Gastrointestinal: Negative.   Endocrine: Negative.   Musculoskeletal: Positive for arthralgias.  Skin: Negative.   Neurological: Negative.   Hematological: Negative.   Psychiatric/Behavioral: Negative.   All other systems reviewed and are negative.  BP 120/68 mmHg  Pulse 86  Ht 5' 10.5" (1.791 m)  Wt 238 lb 12 oz (108.296 kg)  BMI 33.76 kg/m2  Physical Exam  Constitutional: He is oriented to person, place, and time. He appears well-developed and well-nourished.  HENT:  Head: Normocephalic.  Nose: Nose normal.  Mouth/Throat: Oropharynx is clear and moist.  Eyes: Conjunctivae are normal. Pupils are equal, round, and reactive to light.  Neck: Normal range of motion. Neck supple. No JVD present.  Cardiovascular: Normal rate, regular rhythm, S1 normal, S2 normal, normal heart sounds and intact distal pulses.  Exam reveals no gallop and no friction rub.   No murmur heard. Pulmonary/Chest: Effort normal and breath sounds normal. No respiratory distress. He has no wheezes. He has no rales. He exhibits no tenderness.  Abdominal: Soft. Bowel sounds are normal. He exhibits no distension. There is no tenderness.  Musculoskeletal: Normal range of motion. He exhibits no edema or tenderness.  Lymphadenopathy:    He has no cervical adenopathy.  Neurological:  He is alert and oriented to person, place, and time. Coordination normal.  Skin: Skin is warm and dry. No rash noted. No erythema.  Psychiatric: He has a normal mood and affect. His behavior is normal. Judgment and thought content normal.      Assessment and Plan   Nursing note and vitals reviewed.

## 2015-06-08 NOTE — Assessment & Plan Note (Signed)
Cholesterol previously above goal, added zetia last year. He would like to lose weight before any further lab draws

## 2015-06-08 NOTE — Assessment & Plan Note (Signed)
Stressed the importance of weight loss, compliance with his CPAP

## 2015-06-11 ENCOUNTER — Telehealth: Payer: Self-pay | Admitting: Internal Medicine

## 2015-06-11 DIAGNOSIS — G4733 Obstructive sleep apnea (adult) (pediatric): Secondary | ICD-10-CM

## 2015-06-11 NOTE — Telephone Encounter (Signed)
lmtcb for pt.  

## 2015-06-12 NOTE — Telephone Encounter (Signed)
Attempted to call pt x2. When the pt answers the phone, I can hear him but he can't hear me. Will try back.

## 2015-06-12 NOTE — Telephone Encounter (Signed)
Spoke with pt. Advised him that I would get an order to [facility] for a new CPAP machine. Nothing further was needed.

## 2015-07-04 ENCOUNTER — Telehealth: Payer: Self-pay | Admitting: Internal Medicine

## 2015-07-04 DIAGNOSIS — G4733 Obstructive sleep apnea (adult) (pediatric): Secondary | ICD-10-CM

## 2015-07-04 DIAGNOSIS — Z9989 Dependence on other enabling machines and devices: Principal | ICD-10-CM

## 2015-07-04 NOTE — Telephone Encounter (Signed)
Spoke with the pt  He states [facility] needing further info regarding CPAP machine  Called [facility] and spoke with Helana and she states that she faxed form for [facility] to sign yesterday and needed pressure settings I checked up front fax, folder up front and CDY's desk and do not see a fax  Per [facility]- the pressure range should be 5-20  I tried calling Helana back to give VO, but she states this has to be in writing  She is going to refax order  Will await fax

## 2015-07-05 NOTE — Telephone Encounter (Signed)
Katie, did you receive this fax?

## 2015-07-06 NOTE — Telephone Encounter (Signed)
I have had no forms on patient.

## 2015-07-06 NOTE — Telephone Encounter (Signed)
Spoke with pt, advised that the order for his cpap is still being processed. Katie please advise if this order has been taken care of.  Thanks!

## 2015-07-06 NOTE — Telephone Encounter (Signed)
Called [facility], order is being refaxed to office.  Will await fax.

## 2015-07-06 NOTE — Telephone Encounter (Signed)
Patient is calling back for status on this, [phone].

## 2015-07-09 NOTE — Telephone Encounter (Signed)
Called and spoke with [facility] to find out what was needed to process this order.  Huey Romans advised me that they needed the order to be re-entered with current pressure settings.  Order has been completed as per [facility]'s specifications and patient has been notified. Nothing further needed.  Closing encounter.

## 2015-07-09 NOTE — Telephone Encounter (Signed)
I have not received any forms; sorry

## 2015-07-09 NOTE — Telephone Encounter (Signed)
I have looked in CY's look at and I do not see the form. Joellen Jersey do you have this form?

## 2015-07-19 ENCOUNTER — Other Ambulatory Visit: Payer: Self-pay | Admitting: Family Medicine

## 2015-08-08 ENCOUNTER — Other Ambulatory Visit: Payer: Self-pay

## 2015-08-10 ENCOUNTER — Other Ambulatory Visit: Payer: Self-pay | Admitting: *Deleted

## 2015-08-10 MED ORDER — POTASSIUM CHLORIDE CRYS ER 20 MEQ PO TBCR: 20.0000 meq | EXTENDED_RELEASE_TABLET | Freq: Every day | ORAL | Status: DC

## 2015-09-11 DIAGNOSIS — H2511 Age-related nuclear cataract, right eye: Secondary | ICD-10-CM | POA: Diagnosis not present

## 2015-09-14 ENCOUNTER — Telehealth: Payer: Self-pay | Admitting: Family Medicine

## 2015-09-14 ENCOUNTER — Encounter (HOSPITAL_COMMUNITY): Payer: Self-pay | Admitting: Family Medicine

## 2015-09-14 ENCOUNTER — Emergency Department (HOSPITAL_COMMUNITY): Payer: PPO

## 2015-09-14 ENCOUNTER — Emergency Department (HOSPITAL_COMMUNITY)
Admission: EM | Admit: 2015-09-14 | Discharge: 2015-09-14 | Disposition: A | Discharge status: Home / Self Care | Payer: PPO | Attending: Emergency Medicine | Admitting: Emergency Medicine

## 2015-09-14 DIAGNOSIS — Z79899 Other long term (current) drug therapy: Secondary | ICD-10-CM | POA: Insufficient documentation

## 2015-09-14 DIAGNOSIS — K5900 Constipation, unspecified: Secondary | ICD-10-CM | POA: Diagnosis not present

## 2015-09-14 DIAGNOSIS — M109 Gout, unspecified: Secondary | ICD-10-CM | POA: Insufficient documentation

## 2015-09-14 DIAGNOSIS — Z87891 Personal history of nicotine dependence: Secondary | ICD-10-CM | POA: Insufficient documentation

## 2015-09-14 DIAGNOSIS — R1032 Left lower quadrant pain: Secondary | ICD-10-CM | POA: Diagnosis not present

## 2015-09-14 DIAGNOSIS — E785 Hyperlipidemia, unspecified: Secondary | ICD-10-CM | POA: Insufficient documentation

## 2015-09-14 DIAGNOSIS — I10 Essential (primary) hypertension: Secondary | ICD-10-CM | POA: Insufficient documentation

## 2015-09-14 DIAGNOSIS — K219 Gastro-esophageal reflux disease without esophagitis: Secondary | ICD-10-CM | POA: Insufficient documentation

## 2015-09-14 DIAGNOSIS — R1031 Right lower quadrant pain: Secondary | ICD-10-CM | POA: Diagnosis not present

## 2015-09-14 DIAGNOSIS — R109 Unspecified abdominal pain: Secondary | ICD-10-CM | POA: Diagnosis not present

## 2015-09-14 DIAGNOSIS — Z7982 Long term (current) use of aspirin: Secondary | ICD-10-CM | POA: Insufficient documentation

## 2015-09-14 DIAGNOSIS — Z8739 Personal history of other diseases of the musculoskeletal system and connective tissue: Secondary | ICD-10-CM | POA: Insufficient documentation

## 2015-09-14 DIAGNOSIS — N2889 Other specified disorders of kidney and ureter: Secondary | ICD-10-CM | POA: Diagnosis not present

## 2015-09-14 LAB — COMPREHENSIVE METABOLIC PANEL
ALBUMIN: 4.4 g/dL (ref 3.5–5.0)
ALT: 35 U/L (ref 17–63)
ANION GAP: 15 (ref 5–15)
AST: 35 U/L (ref 15–41)
Alkaline Phosphatase: 61 U/L (ref 38–126)
BILIRUBIN TOTAL: 0.8 mg/dL (ref 0.3–1.2)
BUN: 26 mg/dL — AB (ref 6–20)
CHLORIDE: 98 mmol/L — AB (ref 101–111)
CO2: 23 mmol/L (ref 22–32)
Calcium: 9.9 mg/dL (ref 8.9–10.3)
Creatinine, Ser: 1.4 mg/dL — ABNORMAL HIGH (ref 0.61–1.24)
GFR calc Af Amer: 57 mL/min — ABNORMAL LOW (ref >60)
GFR calc non Af Amer: 49 mL/min — ABNORMAL LOW (ref >60)
GLUCOSE: 151 mg/dL — AB (ref 65–99)
POTASSIUM: 3.8 mmol/L (ref 3.5–5.1)
SODIUM: 136 mmol/L (ref 135–145)
Total Protein: 7.4 g/dL (ref 6.5–8.1)

## 2015-09-14 LAB — CBC
HEMATOCRIT: 44.6 % (ref 39.0–52.0)
HEMOGLOBIN: 15.6 g/dL (ref 13.0–17.0)
MCH: 31 pg (ref 26.0–34.0)
MCHC: 35 g/dL (ref 30.0–36.0)
MCV: 88.5 fL (ref 78.0–100.0)
Platelets: 221 10*3/uL (ref 150–400)
RBC: 5.04 MIL/uL (ref 4.22–5.81)
RDW: 13.2 % (ref 11.5–15.5)
WBC: 13.5 10*3/uL — ABNORMAL HIGH (ref 4.0–10.5)

## 2015-09-14 LAB — URINALYSIS, ROUTINE W REFLEX MICROSCOPIC
BILIRUBIN URINE: NEGATIVE
GLUCOSE, UA: NEGATIVE mg/dL
Hgb urine dipstick: NEGATIVE
KETONES UR: NEGATIVE mg/dL
Leukocytes, UA: NEGATIVE
Nitrite: NEGATIVE
PH: 6.5 (ref 5.0–8.0)
Protein, ur: NEGATIVE mg/dL
SPECIFIC GRAVITY, URINE: 1.02 (ref 1.005–1.030)

## 2015-09-14 LAB — LIPASE, BLOOD: LIPASE: 26 U/L (ref 11–51)

## 2015-09-14 MED ORDER — FENTANYL CITRATE (PF) 100 MCG/2ML IJ SOLN
50.0000 ug | Freq: Once | INTRAMUSCULAR | Status: AC
  Administered 2015-09-14: 50 ug via INTRAVENOUS

## 2015-09-14 MED ORDER — MAGNESIUM CITRATE PO SOLN
1.0000 | Freq: Once | ORAL | Status: AC
  Administered 2015-09-14: 1 via ORAL
  Filled 2015-09-14: qty 296

## 2015-09-14 MED ORDER — IOHEXOL 300 MG/ML  SOLN
100.0000 mL | Freq: Once | INTRAMUSCULAR | Status: AC | PRN
  Administered 2015-09-14: 80 mL via INTRAVENOUS

## 2015-09-14 MED ORDER — MORPHINE SULFATE (PF) 4 MG/ML IV SOLN
4.0000 mg | Freq: Once | INTRAVENOUS | Status: AC
  Administered 2015-09-14: 4 mg via INTRAVENOUS
  Filled 2015-09-14: qty 1

## 2015-09-14 MED ORDER — ONDANSETRON HCL 4 MG/2ML IJ SOLN
4.0000 mg | Freq: Once | INTRAMUSCULAR | Status: AC
  Administered 2015-09-14: 4 mg via INTRAVENOUS
  Filled 2015-09-14: qty 2

## 2015-09-14 MED ORDER — FENTANYL CITRATE (PF) 100 MCG/2ML IJ SOLN
50.0000 ug | Freq: Once | INTRAMUSCULAR | Status: AC
  Administered 2015-09-14: 50 ug via INTRAVENOUS
  Filled 2015-09-14: qty 2

## 2015-09-14 MED ORDER — FENTANYL CITRATE (PF) 100 MCG/2ML IJ SOLN
INTRAMUSCULAR | Status: AC
  Administered 2015-09-14: 50 ug
  Filled 2015-09-14: qty 2

## 2015-09-14 NOTE — ED Notes (Signed)
Pt stable, ambulatory, states understanding of discharge instructions 

## 2015-09-14 NOTE — ED Notes (Signed)
Brought patient back to room and asked for a urinalysis. Patient stated he would try but its a problem.

## 2015-09-14 NOTE — ED Notes (Signed)
Pt in bathroom

## 2015-09-14 NOTE — Telephone Encounter (Signed)
Aware-will watch for notes  

## 2015-09-14 NOTE — Telephone Encounter (Signed)
Patient Name: Kurt Aguilar DOB: [birth year] Initial Comment [name] States husband is constipated for 3 days. Nurse Assessment Nurse: Ronnald Ramp, RN, Miranda Date/Time (Eastern Time): 09/14/2015 1:07:18 PM Confirm and document reason for call. If symptomatic, describe symptoms. ---Spoke with patient. He states his stool is loose but he has pain when trying have a BM. He has a history or hemorrhoids. He has not had a normal BM in 4 days. Has the patient traveled out of the country within the last 30 days? ---Not Applicable Does the patient have any new or worsening symptoms? ---Yes Will a triage be completed? ---Yes Related visit to physician within the last 2 weeks? ---No Does the PT have any chronic conditions? (i.e. diabetes, asthma, etc.) ---Yes List chronic conditions. ---Asthma, Allerges, GERD, Diabetes, Diverticuitis Is this a behavioral health or substance abuse call? ---No Guidelines Guideline Title Affirmed Question Affirmed Notes Constipation [1] Sudden onset rectal pain (straining, rectal pressure or fullness) AND [2] NOT better after SITZ bath, suppository or enema Final Disposition User See Physician within 4 Hours (or PCP triage) Ronnald Ramp, RN, Miranda Comments attempted to make an appt. none available, suggested UC or ED. Referrals [facility] - ED Disagree/Comply: Comply

## 2015-09-14 NOTE — ED Notes (Signed)
Pt here for 4 days no BM, sts leaking watery stool. sts he can taste stool when he breathes.

## 2015-09-14 NOTE — Telephone Encounter (Signed)
Per chart review pt is at [facility] ED.

## 2015-09-14 NOTE — Discharge Instructions (Signed)
Drink the magnesium citrate once you get home. This should help you have a bowel movement. Recommend to follow-up with your primary care physician.  Please discuss with her the renal abnormalities that were noted on your CT, it is recommended that you have follow-up imaging which she can arrange for you. Please return here for any new or worsening symptoms.

## 2015-09-14 NOTE — ED Provider Notes (Signed)
CSN: [id number]     Arrival date & time 09/14/15  1355 History   First MD Initiated Contact with Patient 09/14/15 1516     Chief Complaint  Patient presents with  . Abdominal Pain  . Nausea  . Constipation     (Consider location/radiation/quality/duration/timing/severity/associated sxs/prior Treatment) Patient is a 70 y.o. male presenting with abdominal pain and constipation. The history is provided by the patient and medical records.  Abdominal Pain Associated symptoms: constipation   Constipation Associated symptoms: abdominal pain     70 year old male with history of diverticulitis, gout, hyperlipidemia, hypertension, presenting to the ED for abdominal pain. Patient states in the past 4 days he has had progressively worsening abdominal pain. He states he has not had a bowel movement in 4 days which is atypical for him. He states he has tried drinking prune juice and using an enema without production of stool.  He has had issues with constipation in the past, however this feels different.  He states he feels bloated. He was initially able to pass flatus, however none in the past 2 days.  He states he has had some nausea but denies vomiting.  He denies any fever or chills. Patient has no history of abdominal surgeries.  Patient did recently undergo anesthesia a little over a week ago for cataract surgery. States he has been doing well.  Vital signs stable.  Past Medical History  Diagnosis Date  . Diverticulitis   . Gout   . Hyperlipidemia   . Hypertension   . Osteoporosis   . GERD (gastroesophageal reflux disease)   . Atypical migraine   . Hyperglycemia    Past Surgical History  Procedure Laterality Date  . Open heart surgery  09/09/1995-1998    5 bypass  . Tonsillectomy    . Repair knee ligament      right   Family History  Problem Relation Age of Onset  . Heart disease Mother   . Heart disease Father   . Stroke Father   . Heart attack Son 6   Social History   Substance Use Topics  . Smoking status: Former Smoker -- 1.00 packs/day for 3 years    Types: Cigars    Quit date: 11/25/2004  . Smokeless tobacco: None     Comment: occas. cigar quit 2006  . Alcohol Use: No    Review of Systems  Gastrointestinal: Positive for abdominal pain and constipation.  All other systems reviewed and are negative.     Allergies  Oxycodone-acetaminophen  Home Medications   Prior to Admission medications   Medication Sig Start Date End Date Taking? Authorizing Provider  albuterol (VENTOLIN HFA) 108 (90 BASE) MCG/ACT inhaler Inhale 2 puffs into the lungs every 4 (four) hours as needed for wheezing or shortness of breath (tight lungs ). 09/15/13   Deneise Lever, MD  allopurinol (ZYLOPRIM) 100 MG tablet TAKE ONE TABLET BY MOUTH TWICE DAILY 07/19/15   [name], MD  aspirin 81 MG tablet Takes 2 tablets daily.    Historical Provider, MD  benzonatate (TESSALON) 200 MG capsule Take 1 capsule (200 mg total) by mouth 3 (three) times daily as needed for cough. Swallow whole 07/04/13   [name], MD  cetirizine (ZYRTEC) 10 MG tablet Take 10 mg by mouth as needed.     Historical Provider, MD  chlorthalidone (HYGROTON) 25 MG tablet TAKE 1 TABLET EVERY DAY 08/29/14   [name], MD  colchicine 0.6 MG tablet Take 1 tablet (  0.6 mg total) by mouth 2 (two) times daily as needed. With a meal as needed for acute flare of gout 08/07/14   [name], MD  DHA-EPA-Vitamin E (OMEGA-3 COMPLEX PO) Take 4 tablets by mouth daily.    Historical Provider, MD  ezetimibe (ZETIA) 10 MG tablet Take 1 tablet (10 mg total) by mouth daily. 04/09/15   Minna Merritts, MD  furosemide (LASIX) 20 MG tablet Take 10 mg by mouth daily as needed. 02/12/12 06/20/15  Minna Merritts, MD  Glucosamine-Chondroit-Vit C-Mn (GLUCOSAMINE 1500 COMPLEX PO) Take by mouth daily.    Historical Provider, MD  Lutein 20 MG CAPS Take 1 capsule by mouth daily.      Historical Provider, MD  metoprolol tartrate  (LOPRESSOR) 25 MG tablet TAKE 1 TABLET 2 TIMES DAILY. 01/26/15   [name], MD  NON FORMULARY Take 1 tablet by mouth 4 (four) times daily. FOOD NUTRIENT COMPLEX    Historical Provider, MD  NON FORMULARY Take 1 tablet by mouth 2 (two) times daily. CELLULAR COMPLEX    Historical Provider, MD  omeprazole (PRILOSEC) 20 MG capsule TAKE ONE CAPSULE BY MOUTH TWICE DAILY 07/19/15   [name], MD  polycarbophil (FIBERCON) 625 MG tablet Take 625 mg by mouth daily.    Historical Provider, MD  potassium chloride SA (K-DUR,KLOR-CON) 20 MEQ tablet Take 1 tablet (20 mEq total) by mouth daily. 08/10/15   [name], MD  PRODIGY LANCETS 26G MISC 1 each by Does not apply route daily. To check glucose qd and prn for DM2 (250.00) 09/21/12   [name], MD  PRODIGY NO CODING BLOOD GLUC test strip CHECK  BLOOD  GLUCOSE ONE TIME DAILY  AND AS NEEDED 08/29/14   [name], MD  promethazine-codeine [facility] WITH CODEINE) 6.25-10 MG/5ML syrup Take 5 mLs by mouth every 6 (six) hours as needed for cough. 09/15/13   Deneise Lever, MD  rosuvastatin (CRESTOR) 40 MG tablet Take 1 tablet (40 mg total) by mouth daily. 11/22/14   Minna Merritts, MD  Specialty Vitamins Products (VARISAN VITALITY PO) Take 1 tablet by mouth 2 (two) times daily.     Historical Provider, MD  vardenafil (LEVITRA) 20 MG tablet Take 20 mg by mouth as needed.      Historical Provider, MD  vitamin C (ASCORBIC ACID) 500 MG tablet Take 500 mg by mouth daily.    Historical Provider, MD   BP 128/62 mmHg  Pulse 79  Temp(Src) 97.4 F (36.3 C) (Oral)  Resp 20  Ht 5\' 10"  (1.778 m)  Wt 104.327 kg  BMI 33.00 kg/m2  SpO2 97%   Physical Exam  Constitutional: He is oriented to person, place, and time. He appears well-developed and well-nourished.  HENT:  Head: Normocephalic and atraumatic.  Mouth/Throat: Oropharynx is clear and moist.  Eyes: Conjunctivae and EOM are normal. Pupils are equal, round, and reactive to light.  Neck: Normal range of  motion.  Cardiovascular: Normal rate, regular rhythm and normal heart sounds.   Pulmonary/Chest: Effort normal and breath sounds normal. No respiratory distress. He has no wheezes.  Abdominal: Soft. Bowel sounds are normal. There is tenderness in the right lower quadrant and left lower quadrant.  Musculoskeletal: Normal range of motion.  Neurological: He is alert and oriented to person, place, and time.  Skin: Skin is warm and dry.  Psychiatric: He has a normal mood and affect.  Nursing note and vitals reviewed.   ED Course  Procedures (  including critical care time) Labs Review Labs Reviewed  COMPREHENSIVE METABOLIC PANEL - Abnormal; Notable for the following:    Chloride 98 (*)    Glucose, Bld 151 (*)    BUN 26 (*)    Creatinine, Ser 1.40 (*)    GFR calc non Af Amer 49 (*)    GFR calc Af Amer 57 (*)    All other components within normal limits  CBC - Abnormal; Notable for the following:    WBC 13.5 (*)    All other components within normal limits  LIPASE, BLOOD  URINALYSIS, ROUTINE W REFLEX MICROSCOPIC (NOT AT [facility])    Imaging Review Ct Abdomen Pelvis W Contrast  09/14/2015  CLINICAL DATA:  Constipation and lower abdominal pain.  Dysuria EXAM: CT ABDOMEN AND PELVIS WITH CONTRAST TECHNIQUE: Multidetector CT imaging of the abdomen and pelvis was performed using the standard protocol following bolus administration of intravenous contrast. CONTRAST:  87mL OMNIPAQUE IOHEXOL 300 MG/ML  SOLN COMPARISON:  None. FINDINGS: Lower chest: No pleural fluid. No pericardial effusion. The lung bases are clear. Hepatobiliary: Mild diffuse hepatic steatosis. There are multiple small cysts identified throughout the liver. The largest measures 1.4 cm, image 11 of series 2. The gallbladder appears normal. No biliary dilatation. Pancreas: Negative Spleen: Multiple calcified splenic granulomas noted. Adrenals/Urinary Tract: Normal appearance of the adrenal glands. There are bilateral polycystic kidneys  identified. Most of these cysts appear simple. Arising from the upper pole of the right kidney is a hyperdense lesion measuring 1.2 cm and 26 HU, image 12 of series 7. No obstructive uropathy identified. There is symmetric excretion of contrast material on the delayed images. Urinary bladder is unremarkable. Stomach/Bowel: The stomach is normal. The small bowel loops have a normal course and caliber. The appendix is visualized and appears normal. Unremarkable appearance of the proximal colon. There is a moderate stool burden identified within the distal colon with desiccated stool identified in the rectum. Vascular/Lymphatic: Calcified atherosclerotic disease involves the abdominal aorta. No aneurysm. No enlarged retroperitoneal or mesenteric adenopathy. No enlarged pelvic or inguinal lymph nodes. Reproductive: The prostate gland and seminal vesicles are unremarkable. Other: No free fluid identified. There is mild soft tissue stranding within the left iliac fossa around the mid left ureter. Nonspecific. Musculoskeletal: No aggressive lytic or sclerotic bone lesions identified. IMPRESSION: 1. There is a moderate amount of stool within the distal colon and rectum compatible with the clinical history of constipation. Cannot rule out rectal impaction. 2. Polycystic kidney disease. 3. There is an intermediate attenuating lesion arising from the upper pole of right kidney which may represent a hyperdense cyst or enhancing renal lesion. Recommend followup imaging with nonemergent renal protocol MR. 4. Liver cysts 5. Aortic atherosclerosis 6. Mild very ureteral fat stranding is noted within the left lower quadrant of the abdomen. Correlate for any clinical signs or symptoms of urinary tract infection. Electronically Signed   By: Kerby Moors M.D.   On: 09/14/2015 17:10   I have personally reviewed and evaluated these images and lab results as part of my medical decision-making.   EKG Interpretation None      MDM    Final diagnoses:  Abdominal pain, unspecified abdominal location  Constipation, unspecified constipation type   70 year old male here with abdominal pain. He has not had a bowel movement in 4 days.  Patient without any significant distention on exam. He does report he feels bloated. Has tenderness of his lower abdomen without peritonitis.  Patient does have leukocytosis on his  lab work. His renal function appears baseline.  CT scan was obtained revealing constipation, no evidence of bowel obstruction currently. There is also incidental finding of possible cysts versus enhancing renal lesion of the right upper kidney. Recommended follow-up MRI. U/a without signs of infection, no current dysuria or hematuria.  Results were discussed with patient and wife, they knowledge understanding. Patient was offered enema versus magnesium citrate here in the emergency department to help aid bowel movement, he preferred to take these at home which I feel is reasonable.  Patient will follow-up with his PCP next week-- given hard copies of his radiology results to ensure renal lesion is followed up appropriately.  Discussed plan with patient, he/she acknowledged understanding and agreed with plan of care.  Return precautions given for new or worsening symptoms.  Larene Pickett, PA-C 09/14/15 2004  Tanna Furry, MD 09/15/15 [id number]

## 2015-09-14 NOTE — ED Notes (Addendum)
Pt requests in/out cath, unable to urinate.

## 2015-09-15 DIAGNOSIS — G4733 Obstructive sleep apnea (adult) (pediatric): Secondary | ICD-10-CM | POA: Diagnosis not present

## 2015-09-18 DIAGNOSIS — H2511 Age-related nuclear cataract, right eye: Secondary | ICD-10-CM | POA: Diagnosis not present

## 2015-09-24 ENCOUNTER — Encounter: Payer: Self-pay | Admitting: Family Medicine

## 2015-09-24 ENCOUNTER — Ambulatory Visit (INDEPENDENT_AMBULATORY_CARE_PROVIDER_SITE_OTHER): Payer: PPO | Admitting: Family Medicine

## 2015-09-24 VITALS — BP 130/86 | HR 53 | Temp 97.4°F | Ht 70.5 in | Wt 236.5 lb

## 2015-09-24 DIAGNOSIS — N289 Disorder of kidney and ureter, unspecified: Secondary | ICD-10-CM | POA: Diagnosis not present

## 2015-09-24 DIAGNOSIS — R361 Hematospermia: Secondary | ICD-10-CM | POA: Diagnosis not present

## 2015-09-24 DIAGNOSIS — N281 Cyst of kidney, acquired: Secondary | ICD-10-CM | POA: Insufficient documentation

## 2015-09-24 DIAGNOSIS — Q6100 Congenital renal cyst, unspecified: Secondary | ICD-10-CM | POA: Diagnosis not present

## 2015-09-24 DIAGNOSIS — K5900 Constipation, unspecified: Secondary | ICD-10-CM

## 2015-09-24 DIAGNOSIS — D72829 Elevated white blood cell count, unspecified: Secondary | ICD-10-CM | POA: Diagnosis not present

## 2015-09-24 LAB — CBC WITH DIFFERENTIAL/PLATELET
BASOS PCT: 0.5 % (ref 0.0–3.0)
Basophils Absolute: 0 10*3/uL (ref 0.0–0.1)
EOS ABS: 0.5 10*3/uL (ref 0.0–0.7)
EOS PCT: 5.6 % — AB (ref 0.0–5.0)
HCT: 45.2 % (ref 39.0–52.0)
HEMOGLOBIN: 15 g/dL (ref 13.0–17.0)
LYMPHS ABS: 2.7 10*3/uL (ref 0.7–4.0)
Lymphocytes Relative: 31.6 % (ref 12.0–46.0)
MCHC: 33.1 g/dL (ref 30.0–36.0)
MCV: 91.7 fl (ref 78.0–100.0)
MONO ABS: 0.8 10*3/uL (ref 0.1–1.0)
Monocytes Relative: 9.7 % (ref 3.0–12.0)
NEUTROS ABS: 4.4 10*3/uL (ref 1.4–7.7)
NEUTROS PCT: 52.6 % (ref 43.0–77.0)
PLATELETS: 250 10*3/uL (ref 150.0–400.0)
RBC: 4.93 Mil/uL (ref 4.22–5.81)
RDW: 13.8 % (ref 11.5–15.5)
WBC: 8.4 10*3/uL (ref 4.0–10.5)

## 2015-09-24 LAB — PSA: PSA: 1.11 ng/mL (ref 0.10–4.00)

## 2015-09-24 LAB — RENAL FUNCTION PANEL
ALBUMIN: 4.5 g/dL (ref 3.5–5.2)
BUN: 23 mg/dL (ref 6–23)
CALCIUM: 10.1 mg/dL (ref 8.4–10.5)
CHLORIDE: 100 meq/L (ref 96–112)
CO2: 28 mEq/L (ref 19–32)
Creatinine, Ser: 1.56 mg/dL — ABNORMAL HIGH (ref 0.40–1.50)
GFR: 46.73 mL/min — ABNORMAL LOW (ref >60.00)
Glucose, Bld: 133 mg/dL — ABNORMAL HIGH (ref 70–99)
Phosphorus: 3.2 mg/dL (ref 2.3–4.6)
Potassium: 4.1 mEq/L (ref 3.5–5.1)
SODIUM: 142 meq/L (ref 135–145)

## 2015-09-24 MED ORDER — POTASSIUM CHLORIDE CRYS ER 20 MEQ PO TBCR: 20.0000 meq | EXTENDED_RELEASE_TABLET | Freq: Every day | ORAL | Status: DC

## 2015-09-24 MED ORDER — OMEPRAZOLE 20 MG PO CPDR: 20.0000 mg | DELAYED_RELEASE_CAPSULE | Freq: Two times a day (BID) | ORAL | Status: DC

## 2015-09-24 MED ORDER — ALLOPURINOL 100 MG PO TABS: 100.0000 mg | ORAL_TABLET | Freq: Two times a day (BID) | ORAL | Status: DC

## 2015-09-24 MED ORDER — ROSUVASTATIN CALCIUM 40 MG PO TABS: 40.0000 mg | ORAL_TABLET | Freq: Every day | ORAL | Status: DC

## 2015-09-24 MED ORDER — COLCHICINE 0.6 MG PO TABS: 0.6000 mg | ORAL_TABLET | Freq: Two times a day (BID) | ORAL | Status: DC | PRN

## 2015-09-24 MED ORDER — CHLORTHALIDONE 25 MG PO TABS: 25.0000 mg | ORAL_TABLET | Freq: Every day | ORAL | Status: DC

## 2015-09-24 MED ORDER — METOPROLOL TARTRATE 25 MG PO TABS: ORAL_TABLET | ORAL | Status: DC

## 2015-09-24 MED ORDER — EZETIMIBE 10 MG PO TABS: 10.0000 mg | ORAL_TABLET | Freq: Every day | ORAL | Status: DC

## 2015-09-24 NOTE — Progress Notes (Signed)
Pre visit review using our clinic review tool, if applicable. No additional management support is needed unless otherwise documented below in the visit note. 

## 2015-09-24 NOTE — Progress Notes (Signed)
Subjective:    Patient ID: Kurt Aguilar, male    DOB: 08/03/1946, 70 y.o.   MRN: [id number]  HPI Here for f/u of ED visit on 09/14/15 for abd pain /constipation  Also noticed blood in semen   He was d/c with mag citrate- drank the whole bottle and it worked well  Feels ok now  He had cataract surgery- and the constipation followed (but no pain med and no problems with surgery)  Usually uses prune juice No more abd pain   Has also been noticing blood in his semen- just a little  Aware of that for 3 months No pain  Lab Results  Component Value Date   PSA 1.07 07/28/2014   PSA 0.81 07/04/2013     Results for orders placed or performed during the hospital encounter of 09/14/15  Lipase, blood  Result Value Ref Range   Lipase 26 11 - 51 U/L  Comprehensive metabolic panel  Result Value Ref Range   Sodium 136 135 - 145 mmol/L   Potassium 3.8 3.5 - 5.1 mmol/L   Chloride 98 (L) 101 - 111 mmol/L   CO2 23 22 - 32 mmol/L   Glucose, Bld 151 (H) 65 - 99 mg/dL   BUN 26 (H) 6 - 20 mg/dL   Creatinine, Ser 1.40 (H) 0.61 - 1.24 mg/dL   Calcium 9.9 8.9 - 10.3 mg/dL   Total Protein 7.4 6.5 - 8.1 g/dL   Albumin 4.4 3.5 - 5.0 g/dL   AST 35 15 - 41 U/L   ALT 35 17 - 63 U/L   Alkaline Phosphatase 61 38 - 126 U/L   Total Bilirubin 0.8 0.3 - 1.2 mg/dL   GFR calc non Af Amer 49 (L) >60 mL/min   GFR calc Af Amer 57 (L) >60 mL/min   Anion gap 15 5 - 15  CBC  Result Value Ref Range   WBC 13.5 (H) 4.0 - 10.5 K/uL   RBC 5.04 4.22 - 5.81 MIL/uL   Hemoglobin 15.6 13.0 - 17.0 g/dL   HCT 44.6 39.0 - 52.0 %   MCV 88.5 78.0 - 100.0 fL   MCH 31.0 26.0 - 34.0 pg   MCHC 35.0 30.0 - 36.0 g/dL   RDW 13.2 11.5 - 15.5 %   Platelets 221 150 - 400 K/uL  Urinalysis, Routine w reflex microscopic (not at [facility])  Result Value Ref Range   Color, Urine YELLOW YELLOW   APPearance CLEAR CLEAR   Specific Gravity, Urine 1.020 1.005 - 1.030   pH 6.5 5.0 - 8.0   Glucose, UA NEGATIVE NEGATIVE mg/dL   Hgb urine  dipstick NEGATIVE NEGATIVE   Bilirubin Urine NEGATIVE NEGATIVE   Ketones, ur NEGATIVE NEGATIVE mg/dL   Protein, ur NEGATIVE NEGATIVE mg/dL   Nitrite NEGATIVE NEGATIVE   Leukocytes, UA NEGATIVE NEGATIVE    Ct of abd/pel 09/14/2015 CLINICAL DATA: Constipation and lower abdominal pain. Dysuria EXAM: CT ABDOMEN AND PELVIS WITH CONTRAST TECHNIQUE: Multidetector CT imaging of the abdomen and pelvis was performed using the standard protocol following bolus administration of intravenous contrast. CONTRAST: 11mL OMNIPAQUE IOHEXOL 300 MG/ML SOLN COMPARISON: None. FINDINGS: Lower chest: No pleural fluid. No pericardial effusion. The lung bases are clear. Hepatobiliary: Mild diffuse hepatic steatosis. There are multiple small cysts identified throughout the liver. The largest measures 1.4 cm, image 11 of series 2. The gallbladder appears normal. No biliary dilatation. Pancreas: Negative Spleen: Multiple calcified splenic granulomas noted. Adrenals/Urinary Tract: Normal appearance of the adrenal glands. There  are bilateral polycystic kidneys identified. Most of these cysts appear simple. Arising from the upper pole of the right kidney is a hyperdense lesion measuring 1.2 cm and 26 HU, image 12 of series 7. No obstructive uropathy identified. There is symmetric excretion of contrast material on the delayed images. Urinary bladder is unremarkable. Stomach/Bowel: The stomach is normal. The small bowel loops have a normal course and caliber. The appendix is visualized and appears normal. Unremarkable appearance of the proximal colon. There is a moderate stool burden identified within the distal colon with desiccated stool identified in the rectum. Vascular/Lymphatic: Calcified atherosclerotic disease involves the abdominal aorta. No aneurysm. No enlarged retroperitoneal or mesenteric adenopathy. No enlarged pelvic or inguinal lymph nodes. Reproductive: The prostate gland and seminal vesicles are unremarkable. Other: No  free fluid identified. There is mild soft tissue stranding within the left iliac fossa around the mid left ureter. Nonspecific. Musculoskeletal: No aggressive lytic or sclerotic bone lesions identified. IMPRESSION: 1. There is a moderate amount of stool within the distal colon and rectum compatible with the clinical history of constipation. Cannot rule out rectal impaction. 2. Polycystic kidney disease. 3. There is an intermediate attenuating lesion arising from the upper pole of right kidney which may represent a hyperdense cyst or enhancing renal lesion. Recommend followup imaging with nonemergent renal protocol MR. 4. Liver cysts 5. Aortic atherosclerosis 6. Mild very ureteral fat stranding is noted within the left lower quadrant of the abdomen. Correlate for any clinical signs or symptoms of urinary tract infection. Electronically Signed By: Kerby Moors M.D. On: 09/14/2015 17:10   Recommended getting an MRI of the kidney   Has never had a renal cyst that he knows of  Mildly elevated Cr for a while   Drinks a lot of water No sodas  occ coffee  Is trying to loose wt again  He went off the wagon - after his son died-gained 40 lb So far 12 lb down- doing wt watchers again   Patient Active Problem List   Diagnosis Date Noted  . Constipation 09/24/2015  . Blood in semen 09/24/2015  . Cyst of right kidney 09/24/2015  . Leukocytosis 09/24/2015  . CAD (coronary artery disease) 06/08/2015  . Grieving 11/22/2014  . Left knee injury 06/19/2014  . Encounter for Medicare annual wellness exam 07/11/2013  . Prostate cancer screening 07/03/2013  . Right bundle branch block 02/22/2013  . Chronic low back pain 05/13/2011  . Obstructive sleep apnea 03/15/2010  . Hyperlipidemia 04/12/2009  . Gout 04/12/2009  . Essential hypertension 04/12/2009  . CAD (coronary artery disease) of artery bypass graft 04/12/2009  . GERD 04/12/2009  . Renal insufficiency 04/12/2009  . Diabetes type 2, controlled  ([place]) 04/12/2009  . DIVERTICULITIS, HX OF 04/12/2009   Past Medical History  Diagnosis Date  . Diverticulitis   . Gout   . Hyperlipidemia   . Hypertension   . Osteoporosis   . GERD (gastroesophageal reflux disease)   . Atypical migraine   . Hyperglycemia    Past Surgical History  Procedure Laterality Date  . Open heart surgery  09/09/1995-1998    5 bypass  . Tonsillectomy    . Repair knee ligament      right   Social History  Substance Use Topics  . Smoking status: Former Smoker -- 1.00 packs/day for 3 years    Types: Cigars    Quit date: 11/25/2004  . Smokeless tobacco: None     Comment: occas. cigar quit 2006  . Alcohol  Use: 0.6 oz/week    1 Standard drinks or equivalent per week     Comment: rare   Family History  Problem Relation Age of Onset  . Heart disease Mother   . Heart disease Father   . Stroke Father   . Heart attack Son 85   Allergies  Allergen Reactions  . Oxycodone-Acetaminophen     Stops breathing   Current Outpatient Prescriptions on File Prior to Visit  Medication Sig Dispense Refill  . albuterol (VENTOLIN HFA) 108 (90 BASE) MCG/ACT inhaler Inhale 2 puffs into the lungs every 4 (four) hours as needed for wheezing or shortness of breath (tight lungs ). 1 Inhaler prn  . aspirin 81 MG tablet Takes 2 tablets daily.    . DHA-EPA-Vitamin E (OMEGA-3 COMPLEX PO) Take 4 tablets by mouth daily.    . Glucosamine HCl (GLUCOSAMINE PO) Take 1 tablet by mouth daily.    Marland Kitchen ketorolac (ACULAR) 0.4 % SOLN Place 1 drop into the right eye 4 (four) times daily.    . Lutein 20 MG CAPS Take 1 capsule by mouth daily.      . NON FORMULARY Take 1 tablet by mouth 4 (four) times daily. FOOD NUTRIENT COMPLEX    . NON FORMULARY Take 1 tablet by mouth 2 (two) times daily. CELLULAR COMPLEX    . ofloxacin (OCUFLOX) 0.3 % ophthalmic solution Place 1 drop into both eyes 4 (four) times daily.    . polycarbophil (FIBERCON) 625 MG tablet Take 625 mg by mouth daily.    .  prednisoLONE acetate (PRED FORTE) 1 % ophthalmic suspension Place 1 drop into the right eye 4 (four) times daily.    Marland Kitchen PRODIGY LANCETS 26G MISC 1 each by Does not apply route daily. To check glucose qd and prn for DM2 (250.00) 100 each 3  . PRODIGY NO CODING BLOOD GLUC test strip CHECK  BLOOD  GLUCOSE ONE TIME DAILY  AND AS NEEDED 200 each 3  . promethazine-codeine (PHENERGAN WITH CODEINE) 6.25-10 MG/5ML syrup Take 5 mLs by mouth every 6 (six) hours as needed for cough. 180 mL 0  . vitamin C (ASCORBIC ACID) 500 MG tablet Take 1,000 mg by mouth daily.     . furosemide (LASIX) 20 MG tablet Take 10 mg by mouth daily as needed.     No current facility-administered medications on file prior to visit.    Review of Systems Review of Systems  Constitutional: Negative for fever, appetite change, fatigue and unexpected weight change.  Eyes: Negative for pain and visual disturbance.  Respiratory: Negative for cough and shortness of breath.   Cardiovascular: Negative for cp or palpitations    Gastrointestinal: Negative for nausea, diarrhea and and pos for constipation that is resolved  Genitourinary: Negative for urgency and frequency. neg for hematuria, pos for blood in semen Neg for nocturia or urinary hesitancy Skin: Negative for pallor or rash   Neurological: Negative for weakness, light-headedness, numbness and headaches.  Hematological: Negative for adenopathy. Does not bruise/bleed easily.  Psychiatric/Behavioral: Negative for dysphoric mood. The patient is not nervous/anxious.         Objective:   Physical Exam  Constitutional: He appears well-developed and well-nourished. No distress.  obese and well appearing   HENT:  Head: Normocephalic and atraumatic.  Mouth/Throat: Oropharynx is clear and moist.  Eyes: Conjunctivae and EOM are normal. Pupils are equal, round, and reactive to light.  Neck: Normal range of motion. Neck supple. No JVD present. Carotid bruit is not present.  No  thyromegaly present.  Cardiovascular: Normal rate, regular rhythm, normal heart sounds and intact distal pulses.  Exam reveals no gallop.   Pulmonary/Chest: Effort normal and breath sounds normal. No respiratory distress. He has no wheezes. He has no rales.  No crackles  Abdominal: Soft. Bowel sounds are normal. He exhibits no distension, no abdominal bruit and no mass. There is no tenderness.  No cva tenderness  No suprapubic tenderness or fullness    Musculoskeletal: He exhibits no edema.  Lymphadenopathy:    He has no cervical adenopathy.  Neurological: He is alert. He has normal reflexes.  Skin: Skin is warm and dry. No rash noted.  Psychiatric: He has a normal mood and affect.          Assessment & Plan:   Problem List Items Addressed This Visit      Digestive   Constipation    Much improved with mag citrate Making and effort re: more fluids and fiber Suggested miralax when needed Rev ED notes and studies in detail         Genitourinary   Cyst of right kidney    Lesion resembling cyst in upper pole of R kidney Also renal insuf  Nl UA Will plan MRI after re check renal labs       Renal insufficiency - Primary    This was stable in ED Also UA was normal  Polycystic kidneys/ lesion on upper pole of R kidney noted and MR with renal protocol recommended  Will re  Check renal panel today and order MR once that is returned  Enc water intake       Relevant Orders   Renal function panel (Completed)     Other   Blood in semen    For the past several weeks Rev CT Ref to urology No pain or other symptoms       Relevant Orders   PSA (Completed)   Ambulatory referral to Urology   Leukocytosis    In ED during constipation/abd pain episode Feels better now  Re check today      Relevant Orders   CBC with Differential/Platelet (Completed)

## 2015-09-24 NOTE — Assessment & Plan Note (Signed)
In ED during constipation/abd pain episode Feels better now  Re check today

## 2015-09-24 NOTE — Assessment & Plan Note (Signed)
Lesion resembling cyst in upper pole of R kidney Also renal insuf  Nl UA Will plan MRI after re check renal labs

## 2015-09-24 NOTE — Assessment & Plan Note (Signed)
This was stable in ED Also UA was normal  Polycystic kidneys/ lesion on upper pole of R kidney noted and MR with renal protocol recommended  Will re  Check renal panel today and order MR once that is returned  Enc water intake

## 2015-09-24 NOTE — Assessment & Plan Note (Signed)
Much improved with mag citrate Making and effort re: more fluids and fiber Suggested miralax when needed Rev ED notes and studies in detail

## 2015-09-24 NOTE — Assessment & Plan Note (Signed)
For the past several weeks Rev CT Ref to urology No pain or other symptoms

## 2015-09-24 NOTE — Patient Instructions (Signed)
Labs today Then we will order your MRI  Stop at check out for urology referral  Take care of yourself

## 2015-09-25 ENCOUNTER — Telehealth: Payer: Self-pay | Admitting: Family Medicine

## 2015-09-25 DIAGNOSIS — N281 Cyst of kidney, acquired: Secondary | ICD-10-CM

## 2015-09-25 NOTE — Telephone Encounter (Signed)
Labs look ok  Cr is up a bit -please make sure to drink enough water I am going to order his MRI as we discussed - radiologist adv reduced contrast for this  Will route to [facility]

## 2015-09-25 NOTE — Telephone Encounter (Signed)
Pt notified of Dr. Tower's comments/instructions and verbalized understanding 

## 2015-09-27 ENCOUNTER — Ambulatory Visit (INDEPENDENT_AMBULATORY_CARE_PROVIDER_SITE_OTHER): Payer: PPO | Admitting: Urology

## 2015-09-27 ENCOUNTER — Telehealth (HOSPITAL_COMMUNITY): Payer: Self-pay | Admitting: Family Medicine

## 2015-09-27 ENCOUNTER — Ambulatory Visit (HOSPITAL_COMMUNITY)
Admission: RE | Admit: 2015-09-27 | Discharge: 2015-09-27 | Disposition: A | Discharge status: Home / Self Care | Payer: PPO | Source: Ambulatory Visit | Attending: Family Medicine | Admitting: Family Medicine

## 2015-09-27 ENCOUNTER — Other Ambulatory Visit: Payer: Self-pay | Admitting: Family Medicine

## 2015-09-27 ENCOUNTER — Other Ambulatory Visit: Payer: Self-pay | Admitting: Urology

## 2015-09-27 ENCOUNTER — Ambulatory Visit (HOSPITAL_COMMUNITY): Admission: RE | Admit: 2015-09-27 | Payer: PPO | Source: Ambulatory Visit

## 2015-09-27 ENCOUNTER — Encounter: Payer: Self-pay | Admitting: Urology

## 2015-09-27 VITALS — BP 121/71 | HR 56 | Ht 70.5 in | Wt 235.6 lb

## 2015-09-27 DIAGNOSIS — R935 Abnormal findings on diagnostic imaging of other abdominal regions, including retroperitoneum: Secondary | ICD-10-CM | POA: Diagnosis not present

## 2015-09-27 DIAGNOSIS — K7689 Other specified diseases of liver: Secondary | ICD-10-CM | POA: Insufficient documentation

## 2015-09-27 DIAGNOSIS — Q6100 Congenital renal cyst, unspecified: Secondary | ICD-10-CM | POA: Diagnosis not present

## 2015-09-27 DIAGNOSIS — R361 Hematospermia: Secondary | ICD-10-CM | POA: Diagnosis not present

## 2015-09-27 DIAGNOSIS — I700 Atherosclerosis of aorta: Secondary | ICD-10-CM | POA: Diagnosis not present

## 2015-09-27 DIAGNOSIS — Q613 Polycystic kidney, unspecified: Secondary | ICD-10-CM | POA: Insufficient documentation

## 2015-09-27 DIAGNOSIS — N528 Other male erectile dysfunction: Secondary | ICD-10-CM

## 2015-09-27 DIAGNOSIS — N281 Cyst of kidney, acquired: Secondary | ICD-10-CM

## 2015-09-27 DIAGNOSIS — N138 Other obstructive and reflux uropathy: Secondary | ICD-10-CM | POA: Insufficient documentation

## 2015-09-27 DIAGNOSIS — N529 Male erectile dysfunction, unspecified: Secondary | ICD-10-CM

## 2015-09-27 DIAGNOSIS — N289 Disorder of kidney and ureter, unspecified: Secondary | ICD-10-CM | POA: Insufficient documentation

## 2015-09-27 DIAGNOSIS — N401 Enlarged prostate with lower urinary tract symptoms: Secondary | ICD-10-CM

## 2015-09-27 LAB — BLADDER SCAN AMB NON-IMAGING: SCAN RESULT: 116

## 2015-09-27 MED ORDER — GADOBENATE DIMEGLUMINE 529 MG/ML IV SOLN
12.0000 mL | Freq: Once | INTRAVENOUS | Status: AC | PRN
  Administered 2015-09-27: 12 mL via INTRAVENOUS

## 2015-09-27 NOTE — Progress Notes (Signed)
09/27/2015 1:26 PM   [name] 1946-02-25 [id number]  Referring provider: [name], MD [address] [place], [place] [address]  Chief Complaint  Patient presents with  . Blood in semen    referred by Dr. Loura Pardon  . kidney cyst    HPI: Patient is a 70 year old Caucasian male who is referred to [place] by his primary care physician, Loura Pardon MD, for blood in the semen.  Patient also mentioned he was undergoing an MRI this afternoon for a renal cyst and that he's been having difficulty with erections.  Hematospermia Patient has noted intermittent episodes of blood in his ejaculate fluid over the last 6 months. He has not experienced painful ejaculations.   His current PSA is 1.11 ng/mL on 09/24/2015.  Renal lesion Patient underwent a CT scan of the abdomen and pelvis on 09/14/2015 for constipation and abdominal pain.  He was found to have an intermediate attenuating lesion arising from the upper pole of the right kidney for which renal protocol MRI was suggested by the radiologist.  He has an MRI scheduled this afternoon.  He has denied any flank pain or blood in the urine.  Erectile dysfunction He has been having difficulty with erections for the last few years.   His major complaint is a lack of firmness to his erections.  His libido is preserved.   His risk factors for ED are age, BPH, CAD, HTN and hyperlipidemia.  He denies any painful erections or curvatures with his erections.   He has tried Viagra in the past without good results.  He is uncertain of the dose of the Viagra or if he took an empty stomach, 2 hours prior to intercourse.     BPH WITH LUTS His IPSS score today is 6, which is mild lower urinary tract symptomatology. He is delighted with his quality life due to his urinary symptoms. His PVR is 116 mL.  he did have an episode of acute urinary retention after cataract surgery. This is also associated with constipation. He  states he is urinating well at this time. He also denies any recent fevers, chills, nausea or vomiting.  He does not have a family history of PCa.      IPSS      09/27/15 0900       International Prostate Symptom Score   How often have you had the sensation of not emptying your bladder? Not at All     How often have you had to urinate less than every two hours? More than half the time     How often have you found you stopped and started again several times when you urinated? Not at All     How often have you found it difficult to postpone urination? Not at All     How often have you had a weak urinary stream? Not at All     How often have you had to strain to start urination? Not at All     How many times did you typically get up at night to urinate? 2 Times     Total IPSS Score 6     Quality of Life due to urinary symptoms   If you were to spend the rest of your life with your urinary condition just the way it is now how would you feel about that? Delighted        Score:  1-7 Mild 8-19 Moderate 20-35  Severe    PMH: Past Medical History  Diagnosis Date  . Diverticulitis   . Gout   . Hyperlipidemia   . Hypertension   . Osteoporosis   . GERD (gastroesophageal reflux disease)   . Atypical migraine   . Hyperglycemia   . Arthritis   . Diabetes ([place])   . Heart attack ([place])   . Heart disease   . Sleep apnea     Surgical History: Past Surgical History  Procedure Laterality Date  . Open heart surgery  09/09/1995-1998    5 bypass  . Tonsillectomy    . Repair knee ligament      right  . Cataract extraction Bilateral     Home Medications:    Medication List       This list is accurate as of: 09/27/15  1:26 PM.  Always use your most recent med list.               albuterol 108 (90 Base) MCG/ACT inhaler  Commonly known as:  VENTOLIN HFA  Inhale 2 puffs into the lungs every 4 (four) hours as needed for wheezing or shortness of breath (tight lungs ).      allopurinol 100 MG tablet  Commonly known as:  ZYLOPRIM  Take 1 tablet (100 mg total) by mouth 2 (two) times daily.     aspirin 81 MG tablet  Takes 2 tablets daily.     chlorthalidone 25 MG tablet  Commonly known as:  HYGROTON  Take 1 tablet (25 mg total) by mouth daily.     colchicine 0.6 MG tablet  Take 1 tablet (0.6 mg total) by mouth 2 (two) times daily as needed. With a meal as needed for acute flare of gout     ezetimibe 10 MG tablet  Commonly known as:  ZETIA  Take 1 tablet (10 mg total) by mouth daily.     furosemide 20 MG tablet  Commonly known as:  LASIX  Take 10 mg by mouth daily as needed.     GLUCOSAMINE PO  Take 1 tablet by mouth daily.     ketorolac 0.4 % Soln  Commonly known as:  ACULAR  Place 1 drop into the right eye 4 (four) times daily.     Lutein 20 MG Caps  Take 1 capsule by mouth daily.     metoprolol tartrate 25 MG tablet  Commonly known as:  LOPRESSOR  TAKE 1 TABLET 2 TIMES DAILY.     NON FORMULARY  Take 1 tablet by mouth 4 (four) times daily. Reported on 09/27/2015     NON FORMULARY  Take 1 tablet by mouth 2 (two) times daily. Reported on 09/27/2015     ofloxacin 0.3 % ophthalmic solution  Commonly known as:  OCUFLOX  Place 1 drop into both eyes 4 (four) times daily.     OMEGA-3 COMPLEX PO  Take 4 tablets by mouth daily.     omeprazole 20 MG capsule  Commonly known as:  PRILOSEC  Take 1 capsule (20 mg total) by mouth 2 (two) times daily.     polycarbophil 625 MG tablet  Commonly known as:  FIBERCON  Take 625 mg by mouth daily.     potassium chloride SA 20 MEQ tablet  Commonly known as:  K-DUR,KLOR-CON  Take 1 tablet (20 mEq total) by mouth daily.     prednisoLONE acetate 1 % ophthalmic suspension  Commonly known as:  PRED FORTE  Place 1 drop into the right eye 4 (  four) times daily.     PRODIGY LANCETS 26G Misc  1 each by Does not apply route daily. To check glucose qd and prn for DM2 (250.00)     PRODIGY NO CODING BLOOD GLUC  test strip  Generic drug:  glucose blood  CHECK  BLOOD  GLUCOSE ONE TIME DAILY  AND AS NEEDED     promethazine-codeine 6.25-10 MG/5ML syrup  Commonly known as:  PHENERGAN with CODEINE  Take 5 mLs by mouth every 6 (six) hours as needed for cough.     rosuvastatin 40 MG tablet  Commonly known as:  CRESTOR  Take 1 tablet (40 mg total) by mouth daily.     vitamin C 500 MG tablet  Commonly known as:  ASCORBIC ACID  Take 1,000 mg by mouth daily.        Allergies:  Allergies  Allergen Reactions  . Oxycodone-Acetaminophen     Stops breathing    Family History: Family History  Problem Relation Age of Onset  . Heart disease Mother   . Heart disease Father   . Stroke Father   . Heart attack Son 20  . Kidney disease Neg Hx   . Prostate cancer Neg Hx     Social History:  reports that he quit smoking about 10 years ago. His smoking use included Cigars. He does not have any smokeless tobacco history on file. He reports that he drinks about 0.6 oz of alcohol per week. He reports that he does not use illicit drugs.  ROS: UROLOGY Frequent Urination?: No Hard to postpone urination?: No Burning/pain with urination?: No Get up at night to urinate?: No Leakage of urine?: No Urine stream starts and stops?: No Trouble starting stream?: No Do you have to strain to urinate?: No Blood in urine?: No Urinary tract infection?: No Sexually transmitted disease?: No Injury to kidneys or bladder?: No Painful intercourse?: No Weak stream?: No Erection problems?: Yes Penile pain?: No  Gastrointestinal Nausea?: No Vomiting?: No Indigestion/heartburn?: No Diarrhea?: No Constipation?: Yes  Constitutional Fever: No Night sweats?: No Weight loss?: No Fatigue?: No  Skin Skin rash/lesions?: No Itching?: No  Eyes Blurred vision?: No Double vision?: No  Ears/Nose/Throat Sore throat?: No Sinus problems?: No  Hematologic/Lymphatic Swollen glands?: No Easy bruising?:  No  Cardiovascular Leg swelling?: No Chest pain?: No  Respiratory Cough?: No Shortness of breath?: No  Endocrine Excessive thirst?: No  Musculoskeletal Back pain?: Yes Joint pain?: Yes  Neurological Headaches?: No Dizziness?: No  Psychologic Depression?: No Anxiety?: No  Physical Exam: BP 121/71 mmHg  Pulse 56  Ht 5' 10.5" (1.791 m)  Wt 235 lb 9.6 oz (106.867 kg)  BMI 33.32 kg/m2  Constitutional: Well nourished. Alert and oriented, No acute distress. HEENT: [place] AT, moist mucus membranes. Trachea midline, no masses. Cardiovascular: No clubbing, cyanosis, or edema. Respiratory: Normal respiratory effort, no increased work of breathing. GI: Abdomen is soft, non tender, non distended, no abdominal masses. Liver and spleen not palpable.  No hernias appreciated.  Stool sample for occult testing is not indicated.   GU: No CVA tenderness.  No bladder fullness or masses.  Patient with circumcised phallus.   Urethral meatus is patent.  No penile discharge. No penile lesions or rashes. Scrotum without lesions, cysts, rashes and/or edema.  Testicles are located scrotally bilaterally. No masses are appreciated in the testicles. Left and right epididymis are normal. Rectal: Patient with  normal sphincter tone. Anus and perineum without scarring or rashes. No rectal masses are appreciated. Prostate  is approximately 60 grams, no nodules are appreciated. Seminal vesicles are not palpable. Skin: No rashes, bruises or suspicious lesions. Lymph: No cervical or inguinal adenopathy. Neurologic: Grossly intact, no focal deficits, moving all 4 extremities. Psychiatric: Normal mood and affect.  Laboratory Data: Lab Results  Component Value Date   WBC 8.4 09/24/2015   HGB 15.0 09/24/2015   HCT 45.2 09/24/2015   MCV 91.7 09/24/2015   PLT 250.0 09/24/2015   Lab Results  Component Value Date   CREATININE 1.56* 09/24/2015   Lab Results  Component Value Date   PSA 1.11 09/24/2015   PSA  1.07 07/28/2014   PSA 0.81 07/04/2013   Lab Results  Component Value Date   HGBA1C 6.2 07/28/2014   Lab Results  Component Value Date   TSH 2.05 07/28/2014   Lab Results  Component Value Date   AST 35 09/14/2015   Lab Results  Component Value Date   ALT 35 09/14/2015    Pertinent Imaging: CLINICAL DATA: Constipation and lower abdominal pain. Dysuria  EXAM: CT ABDOMEN AND PELVIS WITH CONTRAST  TECHNIQUE: Multidetector CT imaging of the abdomen and pelvis was performed using the standard protocol following bolus administration of intravenous contrast.  CONTRAST: 51mL OMNIPAQUE IOHEXOL 300 MG/ML SOLN  COMPARISON: None.  FINDINGS: Lower chest: No pleural fluid. No pericardial effusion. The lung bases are clear.  Hepatobiliary: Mild diffuse hepatic steatosis. There are multiple small cysts identified throughout the liver. The largest measures 1.4 cm, image 11 of series 2. The gallbladder appears normal. No biliary dilatation.  Pancreas: Negative  Spleen: Multiple calcified splenic granulomas noted.  Adrenals/Urinary Tract: Normal appearance of the adrenal glands. There are bilateral polycystic kidneys identified. Most of these cysts appear simple. Arising from the upper pole of the right kidney is a hyperdense lesion measuring 1.2 cm and 26 HU, image 12 of series 7. No obstructive uropathy identified. There is symmetric excretion of contrast material on the delayed images. Urinary bladder is unremarkable.  Stomach/Bowel: The stomach is normal. The small bowel loops have a normal course and caliber. The appendix is visualized and appears normal. Unremarkable appearance of the proximal colon. There is a moderate stool burden identified within the distal colon with desiccated stool identified in the rectum.  Vascular/Lymphatic: Calcified atherosclerotic disease involves the abdominal aorta. No aneurysm. No enlarged retroperitoneal or mesenteric  adenopathy. No enlarged pelvic or inguinal lymph nodes.  Reproductive: The prostate gland and seminal vesicles are unremarkable.  Other: No free fluid identified. There is mild soft tissue stranding within the left iliac fossa around the mid left ureter. Nonspecific.  Musculoskeletal: No aggressive lytic or sclerotic bone lesions identified.  IMPRESSION: 1. There is a moderate amount of stool within the distal colon and rectum compatible with the clinical history of constipation. Cannot rule out rectal impaction. 2. Polycystic kidney disease. 3. There is an intermediate attenuating lesion arising from the upper pole of right kidney which may represent a hyperdense cyst or enhancing renal lesion. Recommend followup imaging with nonemergent renal protocol MR. 4. Liver cysts 5. Aortic atherosclerosis 6. Mild very ureteral fat stranding is noted within the left lower quadrant of the abdomen. Correlate for any clinical signs or symptoms of urinary tract infection.   Electronically Signed  By: Kerby Moors M.D.  On: 09/14/2015 17:10        Assessment & Plan:    1. Blood in semen:   I reassured the patient that blood in the semen is a benign finding.  I did  explain that his prostate is large and he could have veins that are friable which bleed easily when he ejaculates. He could start finasteride 5 mg daily if he finds the blood in the semen disturbing. He does not want to start a medication at this time.  2. Renal lesion:   Patient will be having an MRI this afternoon to further evaluate the renal lesion. I will be reviewing those results. If the lesion demonstrates ominous features, I will have the patient make an appointment with one of our physicians.   3. Erectile dysfunction:   We discussed trying a different PDE5 inhibitor, intra-urethral suppositories, intracavernous vasoactive drug injection therapy, vacuum constriction device and penile prosthesis implantation.   He would like to try Cialis 20 mg on demand dosing. I have given him samples.  I advised him to take them 2 hours prior to intercourse on an empty stomach. I also given him a coupon to help offset the cost. He will contact the office with his response to the medication.  He is warned not to take the Cialis with medications that contain nitrates.  I also advised him of the side effects, such as: headache, flushing, dyspepsia, abnormal vision, nasal congestion, back pain, myalgia, nausea, dizziness, and rash.  4.  BPH with LUTS:   Patient's I PSS score 6/0. His PVR is 116 mL. I did advise him that his prostate is enlarged and it is possible in the future that he may have another episode of acute urinary retention since this has occurred in the past.  He does not want to start medication for his prostate at this time. He will inform us if his urinary symptoms worsen.  - BLADDER SCAN AMB NON-IMAGING     Return for pending MRI results.  These notes generated with voice recognition software. I apologize for typographical errors.  Zara Council, [facility] [address] [place], [place] [address] [phone]

## 2015-10-01 ENCOUNTER — Telehealth: Payer: Self-pay | Admitting: Family Medicine

## 2015-10-01 DIAGNOSIS — Q619 Cystic kidney disease, unspecified: Secondary | ICD-10-CM | POA: Insufficient documentation

## 2015-10-01 DIAGNOSIS — N289 Disorder of kidney and ureter, unspecified: Secondary | ICD-10-CM

## 2015-10-01 NOTE — Telephone Encounter (Signed)
Please let the patient know that his MRI demonstrates polycystic kidney disease and that none of the cysts appear malignant.

## 2015-10-01 NOTE — Telephone Encounter (Signed)
-----   Message from Tammi Sou, [place] sent at 10/01/2015 12:04 PM EST ----- Pt notified of MRI results and Dr. Marliss Coots comments from [place], pt does want to see the renal doc and he would like to go somewhere in [place], I advise pt one of our [facility] will call to schedule appt., please put referral in

## 2015-10-01 NOTE — Telephone Encounter (Signed)
Spoke with pt in reference to MRI results. Pt voiced understanding. Pt wanted to know does he still need to see nephrology. Please advise.

## 2015-10-01 NOTE — Telephone Encounter (Signed)
Yes.  He has polycystic kidney disease.

## 2015-10-01 NOTE — Telephone Encounter (Signed)
Ref to nephrology for polycystic kidneys

## 2015-10-02 NOTE — Telephone Encounter (Signed)
Spoke with pt and made aware to keep nephrology appt. Pt voiced understanding.

## 2015-10-04 ENCOUNTER — Ambulatory Visit: Payer: PPO | Admitting: Urology

## 2015-10-05 ENCOUNTER — Other Ambulatory Visit: Payer: Self-pay | Admitting: Urology

## 2015-10-05 ENCOUNTER — Telehealth: Payer: Self-pay | Admitting: Urology

## 2015-10-05 DIAGNOSIS — N529 Male erectile dysfunction, unspecified: Secondary | ICD-10-CM

## 2015-10-05 MED ORDER — TADALAFIL 20 MG PO TABS: 20.0000 mg | ORAL_TABLET | Freq: Every day | ORAL | Status: DC | PRN

## 2015-10-05 NOTE — Telephone Encounter (Signed)
Patient called today and said he would like to go ahead and start on cialis like you discussed. If you could call in a script to [facility] in [place].   Thanks,  Kurt Aguilar

## 2015-10-05 NOTE — Telephone Encounter (Signed)
That will be fine.  I have sent in the prescription for the Cialis.

## 2015-10-09 ENCOUNTER — Other Ambulatory Visit: Payer: Self-pay

## 2015-10-09 DIAGNOSIS — N529 Male erectile dysfunction, unspecified: Secondary | ICD-10-CM

## 2015-10-09 MED ORDER — TADALAFIL 20 MG PO TABS: 20.0000 mg | ORAL_TABLET | Freq: Every day | ORAL | Status: DC | PRN

## 2015-10-09 NOTE — Progress Notes (Signed)
Pt called stating [facility] did not have his script for Cialis. Resent medication to pharmacy for pt.

## 2015-10-16 ENCOUNTER — Ambulatory Visit: Payer: PPO

## 2015-10-16 DIAGNOSIS — G4733 Obstructive sleep apnea (adult) (pediatric): Secondary | ICD-10-CM | POA: Diagnosis not present

## 2015-11-12 DIAGNOSIS — Q612 Polycystic kidney, adult type: Secondary | ICD-10-CM | POA: Diagnosis not present

## 2015-11-12 DIAGNOSIS — R609 Edema, unspecified: Secondary | ICD-10-CM | POA: Diagnosis not present

## 2015-11-12 DIAGNOSIS — N183 Chronic kidney disease, stage 3 (moderate): Secondary | ICD-10-CM | POA: Diagnosis not present

## 2015-11-12 DIAGNOSIS — I10 Essential (primary) hypertension: Secondary | ICD-10-CM | POA: Diagnosis not present

## 2015-11-13 DIAGNOSIS — G4733 Obstructive sleep apnea (adult) (pediatric): Secondary | ICD-10-CM | POA: Diagnosis not present

## 2015-11-15 ENCOUNTER — Telehealth: Payer: Self-pay | Admitting: Family Medicine

## 2015-11-15 ENCOUNTER — Ambulatory Visit (INDEPENDENT_AMBULATORY_CARE_PROVIDER_SITE_OTHER): Payer: PPO | Admitting: Family Medicine

## 2015-11-15 VITALS — BP 120/80 | HR 76 | Temp 97.8°F | Wt 235.0 lb

## 2015-11-15 DIAGNOSIS — J209 Acute bronchitis, unspecified: Secondary | ICD-10-CM

## 2015-11-15 DIAGNOSIS — R059 Cough, unspecified: Secondary | ICD-10-CM

## 2015-11-15 DIAGNOSIS — R05 Cough: Secondary | ICD-10-CM

## 2015-11-15 DIAGNOSIS — R062 Wheezing: Secondary | ICD-10-CM | POA: Diagnosis not present

## 2015-11-15 MED ORDER — ALBUTEROL SULFATE (2.5 MG/3ML) 0.083% IN NEBU
2.5000 mg | INHALATION_SOLUTION | RESPIRATORY_TRACT | Status: AC
  Administered 2015-11-15: 2.5 mg via RESPIRATORY_TRACT

## 2015-11-15 MED ORDER — PREDNISONE 10 MG PO TABS: ORAL_TABLET | ORAL | Status: DC

## 2015-11-15 MED ORDER — ALBUTEROL SULFATE HFA 108 (90 BASE) MCG/ACT IN AERS: 2.0000 | INHALATION_SPRAY | RESPIRATORY_TRACT | Status: DC | PRN

## 2015-11-15 NOTE — Telephone Encounter (Signed)
Aware-will watch for note

## 2015-11-15 NOTE — Telephone Encounter (Signed)
Patient Name: Kurt Aguilar DOB: [birth year] Initial Comment [name] states he lost conscious yesterday, like a seizure. He states he may have bronchitis, [place] He's having some breathing trouble. Nurse Assessment Nurse: Ronnald Ramp, RN, Miranda Date/Time (Eastern Time): 11/15/2015 10:59:10 AM Confirm and document reason for call. If symptomatic, describe symptoms. You must click the next button to save text entered. ---[name] states yesterday he was coughing and passed out. His coughing worse today. Albuterol q 6-8 hrs, last dose was yesterday afternoon. [name] states yesterday he passed out and "twitched" for a few seconds and his lip were purple. [place] and he felt better afterwards so did not seek any medical attention. Has the patient traveled out of the country within the last 30 days? ---No Does the patient have any new or worsening symptoms? ---Yes Will a triage be completed? ---Yes Related visit to physician within the last 2 weeks? ---No Does the PT have any chronic conditions? (i.e. diabetes, asthma, etc.) ---Yes List chronic conditions. ---Asthma Is this a behavioral health or substance abuse call? ---No Guidelines Guideline Title Affirmed Question Affirmed Notes Asthma Attack [1] Wheezing or coughing AND [2] hasn't used neb or inhaler twice AND [3] it's available Final Disposition User See Physician within 4 Hours (or PCP triage) Ronnald Ramp, RN, Miranda Comments No appt available with PCP or primary office. Appt scheduled for today at 2pm with Almira Coaster at [place] location. Referrals GO TO FACILITY OTHER - SPECIFY Disagree/Comply: Comply

## 2015-11-15 NOTE — Telephone Encounter (Signed)
Pt has 30 min appt scheduled with Delano Metz FNP on 11/15/15 at 2pm.

## 2015-11-15 NOTE — Progress Notes (Signed)
Pre visit review using our clinic review tool, if applicable. No additional management support is needed unless otherwise documented below in the visit note. 

## 2015-11-15 NOTE — Progress Notes (Signed)
Subjective:    Patient ID: Kurt Aguilar, male    DOB: [birth year], 70 y.o.   MRN: [id number]  HPI  Kurt Aguilar is a 70 year old male who presents today with a nonproductive cough that has been present for 3 weeks. I am seeing him in place of his PCP which is located at [facility] as no appointments were available. Associated symptom of PND noted and history of bronchitis during the winter months that has been associated with wheezing. Yesterday, he was in a [place] with friends and noted coughing which he was trying to suppress. At that time, he states that he "passed out" for a brief few seconds and did not experience any neuro or cardio prodrome prior to this episode or following the episode. He stated that he did not seek care because he was 45 minutes away from a health care agency and a friend who was with him and a nurse witnessed this episode and noted that this episode was related to coughing, lasted a brief few seconds, and no other symptoms were noted. No symptoms have occurred since that time. Today, he denies fever, chills, sweats, sinus pressure/pain, SOB, chest pain, palpitations, numbness, tingling, or lightheadedness. Treatment at home included albuterol which has provided moderate benefit, however he notes that this medication has expired. Recent sick exposure of grandchildren and also environmental trigger of dog/cat dander exposure.  Review of Systems  Constitutional: Negative for fever, chills and fatigue.  HENT: Positive for postnasal drip. Negative for congestion, ear pain, nosebleeds, rhinorrhea, sinus pressure, sneezing and sore throat.   Respiratory: Positive for cough. Negative for chest tightness, shortness of breath and wheezing.   Cardiovascular: Negative for chest pain, palpitations and leg swelling.  Gastrointestinal: Negative for nausea, vomiting, abdominal pain, diarrhea and constipation.  Genitourinary: Negative for dysuria and hematuria.    Musculoskeletal: Negative for myalgias and back pain.  Skin: Negative for color change and pallor.  Neurological: Negative for dizziness, seizures, syncope, speech difficulty, weakness, light-headedness, numbness and headaches.  Psychiatric/Behavioral:       Denies symptoms of depression or anxiety       Objective:   Physical Exam  Constitutional: He is oriented to person, place, and time. He appears well-developed and well-nourished.  HENT:  Right Ear: Tympanic membrane normal.  Left Ear: Tympanic membrane normal.  Nose: Rhinorrhea present. Right sinus exhibits no maxillary sinus tenderness and no frontal sinus tenderness. Left sinus exhibits no maxillary sinus tenderness and no frontal sinus tenderness.  Mouth/Throat: No posterior oropharyngeal erythema.  Eyes: Pupils are equal, round, and reactive to light.  Cardiovascular: Normal rate and regular rhythm.   Pulmonary/Chest: Effort normal. He has wheezes.  Lymphadenopathy:    He has no cervical adenopathy.  Neurological: He is alert and oriented to person, place, and time.  Skin: Skin is warm and dry. No rash noted.  Psychiatric: He has a normal mood and affect. His behavior is normal.      Assessment & Plan:  1. Acute bronchitis, unspecified organism Supportive measures of increased rest and fluid encouraged. Decreased wheezing after administration of albuterol. Oxygen saturation 98% - albuterol (PROVENTIL) (2.5 MG/3ML) 0.083% nebulizer solution 2.5 mg; Take 3 mLs (2.5 mg total) by nebulization now.  2. Wheezing - albuterol (PROVENTIL) (2.5 MG/3ML) 0.083% nebulizer solution 2.5 mg; Take 3 mLs (2.5 mg total) by nebulization now. - albuterol (VENTOLIN HFA) 108 (90 Base) MCG/ACT inhaler; Inhale 2 puffs into the lungs every 4 (four) hours as needed for  wheezing or shortness of breath (tight lungs ).  Dispense: 1 Inhaler; Refill: prn - predniSONE (DELTASONE) 10 MG tablet; Take 4 tablets by mouth once daily for 2 days, 3 tablets once  daily for 2 days, 2 tablets once daily for 2 days, and 1 tab daily for 2 days.  Dispense: 20 tablet; Refill: 0  3. Cough - predniSONE (DELTASONE) 10 MG tablet; Take 4 tablets by mouth once daily for 2 days, 3 tablets once daily for 2 days, 2 tablets once daily for 2 days, and 1 tab daily for 2 days.  Dispense: 20 tablet; Refill: 0  Advised patient to purchase a spacer to use when administering albuterol if needed. Also advised and encouraged patient to follow up with PCP for further evaluation if symptoms are not improved and for consideration of pulmonary function tests. Patient and wife voiced understanding and agreed with plan.

## 2015-11-15 NOTE — Patient Instructions (Signed)
Please take prednisone as prescribed with food. Consider purchasing a spacer to administer albuterol as needed. If symptoms do not improve, worsen, or you develop fever >101, contact clinic for further evaluation. If you notice any increased SOB, wheezing, or lightheadedness, please seek medical attention immediately.  Acute Bronchitis Bronchitis is inflammation of the airways that extend from the windpipe into the lungs (bronchi). The inflammation often causes mucus to develop. This leads to a cough, which is the most common symptom of bronchitis.  In acute bronchitis, the condition usually develops suddenly and goes away over time, usually in a couple weeks. Smoking, allergies, and asthma can make bronchitis worse. Repeated episodes of bronchitis may cause further lung problems.  CAUSES Acute bronchitis is most often caused by the same virus that causes a cold. The virus can spread from person to person (contagious) through coughing, sneezing, and touching contaminated objects. SIGNS AND SYMPTOMS   Cough.   Fever.   Coughing up mucus.   Body aches.   Chest congestion.   Chills.   Shortness of breath.   Sore throat.  DIAGNOSIS  Acute bronchitis is usually diagnosed through a physical exam. Your health care provider will also ask you questions about your medical history. Tests, such as chest X-rays, are sometimes done to rule out other conditions.  TREATMENT  Acute bronchitis usually goes away in a couple weeks. Oftentimes, no medical treatment is necessary. Medicines are sometimes given for relief of fever or cough. Antibiotic medicines are usually not needed but may be prescribed in certain situations. In some cases, an inhaler may be recommended to help reduce shortness of breath and control the cough. A cool mist vaporizer may also be used to help thin bronchial secretions and make it easier to clear the chest.  HOME CARE INSTRUCTIONS  Get plenty of rest.   Drink enough  fluids to keep your urine clear or pale yellow (unless you have a medical condition that requires fluid restriction). Increasing fluids may help thin your respiratory secretions (sputum) and reduce chest congestion, and it will prevent dehydration.   Take medicines only as directed by your health care provider.  If you were prescribed an antibiotic medicine, finish it all even if you start to feel better.  Avoid smoking and secondhand smoke. Exposure to cigarette smoke or irritating chemicals will make bronchitis worse. If you are a smoker, consider using nicotine gum or skin patches to help control withdrawal symptoms. Quitting smoking will help your lungs heal faster.   Reduce the chances of another bout of acute bronchitis by washing your hands frequently, avoiding people with cold symptoms, and trying not to touch your hands to your mouth, nose, or eyes.   Keep all follow-up visits as directed by your health care provider.  SEEK MEDICAL CARE IF: Your symptoms do not improve after 1 week of treatment.  SEEK IMMEDIATE MEDICAL CARE IF:  You develop an increased fever or chills.   You have chest pain.   You have severe shortness of breath.  You have bloody sputum.   You develop dehydration.  You faint or repeatedly feel like you are going to pass out.  You develop repeated vomiting.  You develop a severe headache. MAKE SURE YOU:   Understand these instructions.  Will watch your condition.  Will get help right away if you are not doing well or get worse.   This information is not intended to replace advice given to you by your health care provider. Make sure you  discuss any questions you have with your health care provider.   Document Released: 10/02/2004 Document Revised: 09/15/2014 Document Reviewed: 02/15/2013 Elsevier Interactive Patient Education [employer].

## 2015-11-29 ENCOUNTER — Ambulatory Visit (INDEPENDENT_AMBULATORY_CARE_PROVIDER_SITE_OTHER): Payer: PPO | Admitting: Family Medicine

## 2015-11-29 ENCOUNTER — Encounter: Payer: Self-pay | Admitting: Family Medicine

## 2015-11-29 VITALS — BP 126/70 | HR 60 | Temp 97.5°F | Wt 236.5 lb

## 2015-11-29 DIAGNOSIS — R05 Cough: Secondary | ICD-10-CM | POA: Diagnosis not present

## 2015-11-29 DIAGNOSIS — R059 Cough, unspecified: Secondary | ICD-10-CM | POA: Insufficient documentation

## 2015-11-29 MED ORDER — FLUTICASONE-SALMETEROL 100-50 MCG/DOSE IN AEPB: 1.0000 | INHALATION_SPRAY | Freq: Two times a day (BID) | RESPIRATORY_TRACT | Status: DC

## 2015-11-29 MED ORDER — PROMETHAZINE-CODEINE 6.25-10 MG/5ML PO SYRP: 5.0000 mL | ORAL_SOLUTION | Freq: Four times a day (QID) | ORAL | Status: DC | PRN

## 2015-11-29 NOTE — Progress Notes (Signed)
Pre visit review using our clinic review tool, if applicable. No additional management support is needed unless otherwise documented below in the visit note. 

## 2015-11-29 NOTE — Patient Instructions (Signed)
It was really nice to meet you. We are starting low dose Advair. Please update [place] with your cough.  Ok to still use the albuterol.  Follow up with your doctor in the next week or two if your symptoms are not improving.

## 2015-11-29 NOTE — Progress Notes (Signed)
SUBJECTIVE:  Kurt Aguilar is a 70 y.o. male pt of Dr. Glori Bickers, new to me, who complains of persistent bronchitis symptoms  Was seen by Thalia Bloodgood, NP on 11/15/15  Notes reviewed.  Given albuterol nebs in office and sent home with prednisone taper along with albuterol inhaler.  He does feel the wheezing has improved but the coughing has persisted.  He gets "bouts" of this once a year.  Feels oral prednisone helps but symptoms deteriorated after stopping it.  Feels prednisone is the only thing that helps when he has these symptoms once a year.  Denies fevers or chills.  Cough is not productive.  Unsure if he has seasonal allergies but he does not think that he does.  Tried cough medicine with codeine that he had at home but it expired several years ago.  Helped a little.   Current Outpatient Prescriptions on File Prior to Visit  Medication Sig Dispense Refill  . albuterol (VENTOLIN HFA) 108 (90 Base) MCG/ACT inhaler Inhale 2 puffs into the lungs every 4 (four) hours as needed for wheezing or shortness of breath (tight lungs ). 1 Inhaler prn  . allopurinol (ZYLOPRIM) 100 MG tablet Take 1 tablet (100 mg total) by mouth 2 (two) times daily. 180 tablet 3  . aspirin 81 MG tablet Takes 2 tablets daily.    . chlorthalidone (HYGROTON) 25 MG tablet Take 1 tablet (25 mg total) by mouth daily. 90 tablet 3  . colchicine 0.6 MG tablet Take 1 tablet (0.6 mg total) by mouth 2 (two) times daily as needed. With a meal as needed for acute flare of gout 45 tablet 1  . DHA-EPA-Vitamin E (OMEGA-3 COMPLEX PO) Take 4 tablets by mouth daily.    Marland Kitchen ezetimibe (ZETIA) 10 MG tablet Take 1 tablet (10 mg total) by mouth daily. 90 tablet 3  . Glucosamine HCl (GLUCOSAMINE PO) Take 1 tablet by mouth daily.    . Lutein 20 MG CAPS Take 1 capsule by mouth daily.      . metoprolol tartrate (LOPRESSOR) 25 MG tablet TAKE 1 TABLET 2 TIMES DAILY. 180 tablet 3  . NON FORMULARY Take 1 tablet by mouth 4 (four) times daily.  Reported on 09/27/2015    . NON FORMULARY Take 1 tablet by mouth 2 (two) times daily. Reported on 09/27/2015    . omeprazole (PRILOSEC) 20 MG capsule Take 1 capsule (20 mg total) by mouth 2 (two) times daily. 180 capsule 3  . polycarbophil (FIBERCON) 625 MG tablet Take 625 mg by mouth daily.    . potassium chloride SA (K-DUR,KLOR-CON) 20 MEQ tablet Take 1 tablet (20 mEq total) by mouth daily. 90 tablet 3  . PRODIGY LANCETS 26G MISC 1 each by Does not apply route daily. To check glucose qd and prn for DM2 (250.00) 100 each 3  . PRODIGY NO CODING BLOOD GLUC test strip CHECK  BLOOD  GLUCOSE ONE TIME DAILY  AND AS NEEDED 200 each 3  . promethazine-codeine (PHENERGAN WITH CODEINE) 6.25-10 MG/5ML syrup Take 5 mLs by mouth every 6 (six) hours as needed for cough. 180 mL 0  . rosuvastatin (CRESTOR) 40 MG tablet Take 1 tablet (40 mg total) by mouth daily. 90 tablet 3  . tadalafil (CIALIS) 20 MG tablet Take 1 tablet (20 mg total) by mouth daily as needed for erectile dysfunction. 10 tablet 12  . vitamin C (ASCORBIC ACID) 500 MG tablet Take 1,000 mg by mouth daily.     . furosemide (LASIX) 20 MG  tablet Take 10 mg by mouth daily as needed.     No current facility-administered medications on file prior to visit.    Allergies  Allergen Reactions  . Oxycodone-Acetaminophen     Stops breathing    Past Medical History  Diagnosis Date  . Diverticulitis   . Gout   . Hyperlipidemia   . Hypertension   . Osteoporosis   . GERD (gastroesophageal reflux disease)   . Atypical migraine   . Hyperglycemia   . Arthritis   . Diabetes ([place])   . Heart attack ([place])   . Heart disease   . Sleep apnea     Past Surgical History  Procedure Laterality Date  . Open heart surgery  09/09/1995-1998    5 bypass  . Tonsillectomy    . Repair knee ligament      right  . Cataract extraction Bilateral     Family History  Problem Relation Age of Onset  . Heart disease Mother   . Heart disease Father   . Stroke  Father   . Heart attack Son 87  . Kidney disease Neg Hx   . Prostate cancer Neg Hx     Social History   Social History  . Marital Status: Married    Spouse Name: N/A  . Number of Children: 0  . Years of Education: N/A   Occupational History  . Restaurant owner    Social History Main Topics  . Smoking status: Former Smoker -- 1.00 packs/day for 3 years    Types: Cigars    Quit date: 11/25/2004  . Smokeless tobacco: Not on file     Comment: occas. cigar quit 2006  . Alcohol Use: 0.6 oz/week    1 Standard drinks or equivalent per week     Comment: rare  . Drug Use: No  . Sexual Activity: Not on file   Other Topics Concern  . Not on file   Social History Narrative   The PMH, PSH, Social History, Family History, Medications, and allergies have been reviewed in [facility], and have been updated if relevant.  Review of Systems  Constitutional: Negative.   HENT: Negative.   Respiratory: Positive for cough and shortness of breath. Negative for wheezing and stridor.   Cardiovascular: Negative.   Gastrointestinal: Negative.   Endocrine: Negative.   Genitourinary: Negative.   Skin: Negative.   Allergic/Immunologic: Negative.   Neurological: Negative.   Hematological: Negative.   Psychiatric/Behavioral: Negative.   All other systems reviewed and are negative.   OBJECTIVE: BP 126/70 mmHg  Pulse 60  Temp(Src) 97.5 F (36.4 C) (Oral)  Wt 236 lb 8 oz (107.276 kg)  SpO2 97%   Physical Exam  Constitutional: He is oriented to person, place, and time and well-developed, well-nourished, and in no distress. No distress.  HENT:  Head: Normocephalic and atraumatic.  Eyes: Conjunctivae are normal.  Cardiovascular: Normal rate and regular rhythm.   Pulmonary/Chest: Effort normal and breath sounds normal. No respiratory distress. He has no wheezes. He has no rales.  Musculoskeletal: Normal range of motion. He exhibits no edema.  Neurological: He is alert and oriented to person,  place, and time.  Skin: Skin is warm and dry. He is not diaphoretic.  Psychiatric: Mood, memory, affect and judgment normal.  Nursing note and vitals reviewed.

## 2015-11-29 NOTE — Assessment & Plan Note (Signed)
Lung exam reassuring today. I would like to avoid further oral steroids since he is diabetic with chronic kidney disease. Advised short term use of inhaled steroid such as Advair- eRx sent, continue as needed proair rescue inhaler.  Cough suppressant with codeine refilled (per pt, he can tolerate this). Follow up with PCP in 1-2 weeks. Has OSA and therefore has a pulmonologist. Advised following up with him as well. The patient indicates understanding of these issues and agrees with the plan.

## 2015-12-03 ENCOUNTER — Ambulatory Visit (INDEPENDENT_AMBULATORY_CARE_PROVIDER_SITE_OTHER): Payer: PPO | Admitting: Internal Medicine

## 2015-12-03 ENCOUNTER — Ambulatory Visit (INDEPENDENT_AMBULATORY_CARE_PROVIDER_SITE_OTHER)
Admission: RE | Admit: 2015-12-03 | Discharge: 2015-12-03 | Disposition: A | Discharge status: Home / Self Care | Payer: PPO | Source: Ambulatory Visit | Attending: Internal Medicine | Admitting: Internal Medicine

## 2015-12-03 ENCOUNTER — Telehealth: Payer: Self-pay | Admitting: Family Medicine

## 2015-12-03 ENCOUNTER — Encounter: Payer: Self-pay | Admitting: Internal Medicine

## 2015-12-03 VITALS — BP 110/70 | HR 78 | Temp 98.5°F | Resp 20 | Wt 230.0 lb

## 2015-12-03 DIAGNOSIS — R0602 Shortness of breath: Secondary | ICD-10-CM | POA: Diagnosis not present

## 2015-12-03 DIAGNOSIS — R05 Cough: Secondary | ICD-10-CM | POA: Diagnosis not present

## 2015-12-03 DIAGNOSIS — R059 Cough, unspecified: Secondary | ICD-10-CM

## 2015-12-03 DIAGNOSIS — J181 Lobar pneumonia, unspecified organism: Secondary | ICD-10-CM | POA: Diagnosis not present

## 2015-12-03 MED ORDER — BENZONATATE 200 MG PO CAPS: 200.0000 mg | ORAL_CAPSULE | Freq: Three times a day (TID) | ORAL | Status: DC | PRN

## 2015-12-03 MED ORDER — ALBUTEROL SULFATE (2.5 MG/3ML) 0.083% IN NEBU
2.5000 mg | INHALATION_SOLUTION | Freq: Once | RESPIRATORY_TRACT | Status: AC
  Administered 2015-12-03: 2.5 mg via RESPIRATORY_TRACT

## 2015-12-03 MED ORDER — LEVOFLOXACIN 500 MG PO TABS: 500.0000 mg | ORAL_TABLET | Freq: Every day | ORAL | Status: DC

## 2015-12-03 MED ORDER — PREDNISONE 20 MG PO TABS: 40.0000 mg | ORAL_TABLET | Freq: Every day | ORAL | Status: DC

## 2015-12-03 NOTE — Telephone Encounter (Signed)
Spoke with Mrs Kurt Aguilar and pt early this morning cough up black stuff when clearing his lungs out; now when coughs up phlegm is clear in color. Pt will keep appt today with Dr Silvio Pate.if condition changes or worsens prior to appt pt will cb.

## 2015-12-03 NOTE — Assessment & Plan Note (Addendum)
Has RLL>LLL findings Lung fields are small---markings may also be compressed (but waited for radiology reading) Will give rocephin then levaquin Benzonatate  See back 2-3 days

## 2015-12-03 NOTE — Telephone Encounter (Signed)
Will evaluate at [facility]

## 2015-12-03 NOTE — Telephone Encounter (Signed)
Patient Name: TREIGHTON MEZEY DOB: 01-22-1946 Initial Comment [name] states her husbands breathing very shallow. He was seen on Friday- DX- Bronchitis, his symptoms are getting worse. Runny low grade fever. Coughing. Coughing up black stuff. Nurse Assessment Nurse: Dimas Chyle, RN, Dellis Filbert Date/Time Eilene Ghazi Time): 12/03/2015 8:10:37 AM Confirm and document reason for call. If symptomatic, describe symptoms. You must click the next button to save text entered. ---[name] states her husbands breathing very shallow. He was seen on Friday- DX- Bronchitis, his symptoms are getting worse. Runny low grade fever. Coughing. Coughing up black stuff. Has the patient traveled out of the country within the last 30 days? ---No Does the patient have any new or worsening symptoms? ---Yes Will a triage be completed? ---Yes Related visit to physician within the last 2 weeks? ---No Does the PT have any chronic conditions? (i.e. diabetes, asthma, etc.) ---Yes List chronic conditions. ---HTN, hyperlipidemia Is this a behavioral health or substance abuse call? ---No Guidelines Guideline Title Affirmed Question Affirmed Notes Breathing Difficulty [1] MILD difficulty breathing (e.g., minimal/no SOB at rest, SOB with walking, pulse <100) AND [2] NEW-onset or WORSE than normal Final Disposition User See Physician within 4 Hours (or PCP triage) Dimas Chyle, RN, Dellis Filbert Comments [name] already has appointment today at 12:15. Referrals REFERRED TO PCP OFFICE Disagree/Comply: Comply

## 2015-12-03 NOTE — Progress Notes (Signed)
Subjective:    Patient ID: Kurt Aguilar, male    DOB: 01/04/1946, 70 y.o.   MRN: [id number]  HPI Here due to persistent cough Wife is with him  "It is really dragging me down" Started around a month ago--while in [place]  Prednisone did help --but then recurred when finished this Then seen again--started advair. This doesn't seem to have helped  Mostly dry cough Fever this weekend--up to 101 briefly. Better this AM Cold at times but no shivering or sweats Does have SOB--most of the time. Even at rest Breathing very shallow  vick's has helped some  Current Outpatient Prescriptions on File Prior to Visit  Medication Sig Dispense Refill  . albuterol (VENTOLIN HFA) 108 (90 Base) MCG/ACT inhaler Inhale 2 puffs into the lungs every 4 (four) hours as needed for wheezing or shortness of breath (tight lungs ). 1 Inhaler prn  . allopurinol (ZYLOPRIM) 100 MG tablet Take 1 tablet (100 mg total) by mouth 2 (two) times daily. 180 tablet 3  . aspirin 81 MG tablet Takes 2 tablets daily.    . chlorthalidone (HYGROTON) 25 MG tablet Take 1 tablet (25 mg total) by mouth daily. 90 tablet 3  . colchicine 0.6 MG tablet Take 1 tablet (0.6 mg total) by mouth 2 (two) times daily as needed. With a meal as needed for acute flare of gout 45 tablet 1  . DHA-EPA-Vitamin E (OMEGA-3 COMPLEX PO) Take 4 tablets by mouth daily.    Marland Kitchen ezetimibe (ZETIA) 10 MG tablet Take 1 tablet (10 mg total) by mouth daily. 90 tablet 3  . Fluticasone-Salmeterol (ADVAIR) 100-50 MCG/DOSE AEPB Inhale 1 puff into the lungs 2 (two) times daily. 1 each 3  . Glucosamine HCl (GLUCOSAMINE PO) Take 1 tablet by mouth daily.    . Lutein 20 MG CAPS Take 1 capsule by mouth daily.      . metoprolol tartrate (LOPRESSOR) 25 MG tablet TAKE 1 TABLET 2 TIMES DAILY. 180 tablet 3  . NON FORMULARY Take 1 tablet by mouth 4 (four) times daily. Reported on 09/27/2015    . NON FORMULARY Take 1 tablet by mouth 2 (two) times daily. Reported on 09/27/2015      . omeprazole (PRILOSEC) 20 MG capsule Take 1 capsule (20 mg total) by mouth 2 (two) times daily. 180 capsule 3  . polycarbophil (FIBERCON) 625 MG tablet Take 625 mg by mouth daily.    . potassium chloride SA (K-DUR,KLOR-CON) 20 MEQ tablet Take 1 tablet (20 mEq total) by mouth daily. 90 tablet 3  . PRODIGY LANCETS 26G MISC 1 each by Does not apply route daily. To check glucose qd and prn for DM2 (250.00) 100 each 3  . PRODIGY NO CODING BLOOD GLUC test strip CHECK  BLOOD  GLUCOSE ONE TIME DAILY  AND AS NEEDED 200 each 3  . promethazine-codeine (PHENERGAN WITH CODEINE) 6.25-10 MG/5ML syrup Take 5 mLs by mouth every 6 (six) hours as needed for cough. 180 mL 0  . rosuvastatin (CRESTOR) 40 MG tablet Take 1 tablet (40 mg total) by mouth daily. 90 tablet 3  . tadalafil (CIALIS) 20 MG tablet Take 1 tablet (20 mg total) by mouth daily as needed for erectile dysfunction. 10 tablet 12  . vitamin C (ASCORBIC ACID) 500 MG tablet Take 1,000 mg by mouth daily.     . furosemide (LASIX) 20 MG tablet Take 10 mg by mouth daily as needed.     No current facility-administered medications on file prior to visit.  Allergies  Allergen Reactions  . Oxycodone-Acetaminophen     Stops breathing    Past Medical History  Diagnosis Date  . Diverticulitis   . Gout   . Hyperlipidemia   . Hypertension   . Osteoporosis   . GERD (gastroesophageal reflux disease)   . Atypical migraine   . Hyperglycemia   . Arthritis   . Diabetes ([place])   . Heart attack ([place])   . Heart disease   . Sleep apnea     Past Surgical History  Procedure Laterality Date  . Open heart surgery  09/09/1995-1998    5 bypass  . Tonsillectomy    . Repair knee ligament      right  . Cataract extraction Bilateral     Family History  Problem Relation Age of Onset  . Heart disease Mother   . Heart disease Father   . Stroke Father   . Heart attack Son 34  . Kidney disease Neg Hx   . Prostate cancer Neg Hx     Social History    Social History  . Marital Status: Married    Spouse Name: N/A  . Number of Children: 0  . Years of Education: N/A   Occupational History  . Restaurant owner    Social History Main Topics  . Smoking status: Former Smoker -- 1.00 packs/day for 3 years    Types: Cigars    Quit date: 11/25/2004  . Smokeless tobacco: Not on file     Comment: occas. cigar quit 2006  . Alcohol Use: 0.6 oz/week    1 Standard drinks or equivalent per week     Comment: rare  . Drug Use: No  . Sexual Activity: Not on file   Other Topics Concern  . Not on file   Social History Narrative   Review of Systems  No head congestion No nasal drainage No rash No vomiting or diarrhea Appetite is off     Objective:   Physical Exam  Constitutional:  Persistent harsh cough. tachypneic at 22 Seems mildly uncomfortable  HENT:  Mouth/Throat: Oropharynx is clear and moist. No oropharyngeal exudate.  No sinus tenderness TMs normal Slight nasal swelling  Neck: Normal range of motion. Neck supple.  Cardiovascular: Normal rate, regular rhythm and normal heart sounds.  Exam reveals no gallop.   No murmur heard. Pulmonary/Chest: Effort normal.  Decreased breath sounds at bases with faint LLL crackles No wheezes but cough is tight  Musculoskeletal: He exhibits no edema.  Lymphadenopathy:    He has no cervical adenopathy.          Assessment & Plan:

## 2015-12-03 NOTE — Telephone Encounter (Signed)
Pt has appt with Dr Silvio Pate 12/03/15 at 12:15.

## 2015-12-03 NOTE — Progress Notes (Signed)
Pre visit review using our clinic review tool, if applicable. No additional management support is needed unless otherwise documented below in the visit note. 

## 2015-12-03 NOTE — Assessment & Plan Note (Signed)
Persistent for a month Prednisone helped Concern for pneumonia so CXR ordered

## 2015-12-05 DIAGNOSIS — R05 Cough: Secondary | ICD-10-CM | POA: Diagnosis not present

## 2015-12-05 DIAGNOSIS — J181 Lobar pneumonia, unspecified organism: Secondary | ICD-10-CM | POA: Diagnosis not present

## 2015-12-05 MED ORDER — CEFTRIAXONE SODIUM 1 G IJ SOLR
1.0000 g | Freq: Once | INTRAMUSCULAR | Status: AC
  Administered 2015-12-05: 1 g via INTRAMUSCULAR

## 2015-12-05 NOTE — Addendum Note (Signed)
Addended by: Lurlean Nanny on: 12/05/2015 10:30 AM   Modules accepted: Orders

## 2015-12-06 ENCOUNTER — Encounter: Payer: Self-pay | Admitting: Internal Medicine

## 2015-12-06 ENCOUNTER — Ambulatory Visit (INDEPENDENT_AMBULATORY_CARE_PROVIDER_SITE_OTHER): Payer: PPO | Admitting: Internal Medicine

## 2015-12-06 VITALS — BP 118/70 | HR 79 | Temp 97.5°F | Wt 229.0 lb

## 2015-12-06 DIAGNOSIS — J181 Lobar pneumonia, unspecified organism: Secondary | ICD-10-CM | POA: Diagnosis not present

## 2015-12-06 NOTE — Progress Notes (Signed)
Subjective:    Patient ID: Kurt Aguilar, male    DOB: [birth year], 70 y.o.   MRN: [id number]  HPI Follow up on pneumonia Here with wife  Still doesn't feel good Cough persists but is better Breathing is not as shallow per wife No fever but has sweats a couple of nights Has burning feeling in chest--- points to bronchial area (throat and upper chest)  No problems with the antibiotic Tessalon is helping some and the codeine syrup  Tried to take a walk--had to stop and rest after 2 houses  Current Outpatient Prescriptions on File Prior to Visit  Medication Sig Dispense Refill  . albuterol (VENTOLIN HFA) 108 (90 Base) MCG/ACT inhaler Inhale 2 puffs into the lungs every 4 (four) hours as needed for wheezing or shortness of breath (tight lungs ). 1 Inhaler prn  . allopurinol (ZYLOPRIM) 100 MG tablet Take 1 tablet (100 mg total) by mouth 2 (two) times daily. 180 tablet 3  . aspirin 81 MG tablet Takes 2 tablets daily.    . benzonatate (TESSALON) 200 MG capsule Take 1 capsule (200 mg total) by mouth 3 (three) times daily as needed for cough. 60 capsule 0  . chlorthalidone (HYGROTON) 25 MG tablet Take 1 tablet (25 mg total) by mouth daily. 90 tablet 3  . colchicine 0.6 MG tablet Take 1 tablet (0.6 mg total) by mouth 2 (two) times daily as needed. With a meal as needed for acute flare of gout 45 tablet 1  . DHA-EPA-Vitamin E (OMEGA-3 COMPLEX PO) Take 4 tablets by mouth daily.    Marland Kitchen ezetimibe (ZETIA) 10 MG tablet Take 1 tablet (10 mg total) by mouth daily. 90 tablet 3  . Fluticasone-Salmeterol (ADVAIR) 100-50 MCG/DOSE AEPB Inhale 1 puff into the lungs 2 (two) times daily. 1 each 3  . Glucosamine HCl (GLUCOSAMINE PO) Take 1 tablet by mouth daily.    Marland Kitchen levofloxacin (LEVAQUIN) 500 MG tablet Take 1 tablet (500 mg total) by mouth daily. 10 tablet 0  . Lutein 20 MG CAPS Take 1 capsule by mouth daily.      . metoprolol tartrate (LOPRESSOR) 25 MG tablet TAKE 1 TABLET 2 TIMES DAILY. 180 tablet 3  .  NON FORMULARY Take 1 tablet by mouth 4 (four) times daily. Reported on 09/27/2015    . NON FORMULARY Take 1 tablet by mouth 2 (two) times daily. Reported on 09/27/2015    . omeprazole (PRILOSEC) 20 MG capsule Take 1 capsule (20 mg total) by mouth 2 (two) times daily. 180 capsule 3  . polycarbophil (FIBERCON) 625 MG tablet Take 625 mg by mouth daily.    . potassium chloride SA (K-DUR,KLOR-CON) 20 MEQ tablet Take 1 tablet (20 mEq total) by mouth daily. 90 tablet 3  . predniSONE (DELTASONE) 20 MG tablet Take 2 tablets (40 mg total) by mouth daily. 15 tablet 0  . PRODIGY LANCETS 26G MISC 1 each by Does not apply route daily. To check glucose qd and prn for DM2 (250.00) 100 each 3  . PRODIGY NO CODING BLOOD GLUC test strip CHECK  BLOOD  GLUCOSE ONE TIME DAILY  AND AS NEEDED 200 each 3  . promethazine-codeine (PHENERGAN WITH CODEINE) 6.25-10 MG/5ML syrup Take 5 mLs by mouth every 6 (six) hours as needed for cough. 180 mL 0  . rosuvastatin (CRESTOR) 40 MG tablet Take 1 tablet (40 mg total) by mouth daily. 90 tablet 3  . tadalafil (CIALIS) 20 MG tablet Take 1 tablet (20 mg total) by mouth  daily as needed for erectile dysfunction. 10 tablet 12  . vitamin C (ASCORBIC ACID) 500 MG tablet Take 1,000 mg by mouth daily.     . furosemide (LASIX) 20 MG tablet Take 10 mg by mouth daily as needed.     No current facility-administered medications on file prior to visit.    Allergies  Allergen Reactions  . Oxycodone-Acetaminophen     Stops breathing    Past Medical History  Diagnosis Date  . Diverticulitis   . Gout   . Hyperlipidemia   . Hypertension   . Osteoporosis   . GERD (gastroesophageal reflux disease)   . Atypical migraine   . Hyperglycemia   . Arthritis   . Diabetes ([place])   . Heart attack ([place])   . Heart disease   . Sleep apnea     Past Surgical History  Procedure Laterality Date  . Open heart surgery  09/09/1995-1998    5 bypass  . Tonsillectomy    . Repair knee ligament      right    . Cataract extraction Bilateral     Family History  Problem Relation Age of Onset  . Heart disease Mother   . Heart disease Father   . Stroke Father   . Heart attack Son 59  . Kidney disease Neg Hx   . Prostate cancer Neg Hx     Social History   Social History  . Marital Status: Married    Spouse Name: N/A  . Number of Children: 0  . Years of Education: N/A   Occupational History  . Restaurant owner    Social History Main Topics  . Smoking status: Former Smoker -- 1.00 packs/day for 3 years    Types: Cigars    Quit date: 11/25/2004  . Smokeless tobacco: Not on file     Comment: occas. cigar quit 2006  . Alcohol Use: 0.6 oz/week    1 Standard drinks or equivalent per week     Comment: rare  . Drug Use: No  . Sexual Activity: Not on file   Other Topics Concern  . Not on file   Social History Narrative   Review of Systems Eating and taking fluids fine Sleeping okay--- a lot    Objective:   Physical Exam  Constitutional: He appears well-developed. No distress.  Pulmonary/Chest: Effort normal. No respiratory distress. He has no wheezes.  Still with bibasilar light crackles and slightly decreased breath sounds at left base          Assessment & Plan:

## 2015-12-06 NOTE — Assessment & Plan Note (Signed)
Partial response No signs of complications like effusion and is a little better clinically I suspect atypical pneumonia Discussed rest still Finish the antibiotic Call if worsening  Will set up repeat CXR in 1 month

## 2015-12-06 NOTE — Progress Notes (Signed)
Pre visit review using our clinic review tool, if applicable. No additional management support is needed unless otherwise documented below in the visit note. 

## 2015-12-10 ENCOUNTER — Ambulatory Visit: Payer: PPO | Admitting: Internal Medicine

## 2015-12-10 ENCOUNTER — Telehealth: Payer: Self-pay

## 2015-12-10 NOTE — Telephone Encounter (Signed)
Spoke to her He tried to walk 4 houses each way on Friday (since he felt better) and really exhausted himself No SOB or fever now--just fatigued Offered pulmonary second opinion--- and we discussed this. Will wait for now---will do this later in week if still not clearly improving

## 2015-12-10 NOTE — Telephone Encounter (Signed)
Kurt Aguilar left v/m(DPR signed); pt was seen 12/06/15 with pneumonia; Pt is very weak, if pt goes to bathroom gets totally exhausted. pt is not showing any improvement;Kurt Aguilar request cb from Dr Silvio Pate.

## 2015-12-12 ENCOUNTER — Ambulatory Visit (INDEPENDENT_AMBULATORY_CARE_PROVIDER_SITE_OTHER)
Admission: RE | Admit: 2015-12-12 | Discharge: 2015-12-12 | Disposition: A | Discharge status: Home / Self Care | Payer: PPO | Source: Ambulatory Visit | Attending: Family Medicine | Admitting: Family Medicine

## 2015-12-12 ENCOUNTER — Ambulatory Visit (INDEPENDENT_AMBULATORY_CARE_PROVIDER_SITE_OTHER): Payer: PPO | Admitting: Family Medicine

## 2015-12-12 ENCOUNTER — Telehealth: Payer: Self-pay | Admitting: Internal Medicine

## 2015-12-12 ENCOUNTER — Encounter: Payer: Self-pay | Admitting: Family Medicine

## 2015-12-12 VITALS — BP 116/72 | HR 115 | Temp 97.6°F | Ht 70.5 in | Wt 229.5 lb

## 2015-12-12 DIAGNOSIS — J181 Lobar pneumonia, unspecified organism: Secondary | ICD-10-CM

## 2015-12-12 DIAGNOSIS — I959 Hypotension, unspecified: Secondary | ICD-10-CM | POA: Diagnosis not present

## 2015-12-12 DIAGNOSIS — R5383 Other fatigue: Secondary | ICD-10-CM | POA: Insufficient documentation

## 2015-12-12 DIAGNOSIS — E119 Type 2 diabetes mellitus without complications: Secondary | ICD-10-CM

## 2015-12-12 DIAGNOSIS — R05 Cough: Secondary | ICD-10-CM | POA: Diagnosis not present

## 2015-12-12 DIAGNOSIS — R5382 Chronic fatigue, unspecified: Secondary | ICD-10-CM

## 2015-12-12 LAB — GLUCOSE, POCT (MANUAL RESULT ENTRY): POC GLUCOSE: 155 mg/dL — AB (ref 70–99)

## 2015-12-12 NOTE — Telephone Encounter (Signed)
Patient Name: JAVIER MONGILLO DOB: Jun 29, 1946 Initial Comment [name] states husband has been weak, last night his BP was 98/58. this morning it was 98/71 then again 106/66 w/ pulse of 82 - still weak Nurse Assessment Nurse: Marcelline Deist, RN, Lynda Date/Time (Eastern Time): 12/12/2015 8:44:33 AM Confirm and document reason for call. If symptomatic, describe symptoms. You must click the next button to save text entered. ---[name] states husband has been weak, last night his BP was 98/58. this morning it was 98/71, then again 106/66 w/ pulse of 82, but is still weak. Is being treated for pneumonia, on Prednisone & an antibiotic - Levofloxacin, finished those rxs. Spoke with Dr. Silvio Pate Monday afternoon, seen Thursday. Told to wait a couple days, if no improvement, may need to see a pulmonary specialist. Did not take BP rx this am. No fever, has been dizzy & lightheaded. Has the patient traveled out of the country within the last 30 days? ---Not Applicable Does the patient have any new or worsening symptoms? ---Yes Will a triage be completed? ---Yes Related visit to physician within the last 2 weeks? ---Yes Does the PT have any chronic conditions? (i.e. diabetes, asthma, etc.) ---Yes List chronic conditions. ---on cholesterol rx, BP rx Is this a behavioral health or substance abuse call? ---No Guidelines Guideline Title Affirmed Question Affirmed Notes Pneumonia on Antibiotic Post- Hospitalization Follow-up Call [1] Finished taking antibiotics AND [2] no improvement (e.g., "not feeling any better") Final Disposition User See PCP When Office is Open (within 3 days) Marcelline Deist, RN, [employer] would like some further feedback from patient's Dr. as he does not feel there has been improvement. he is weak, lightheaded & BP is lower than usual. He has finished rx course for pneumonia. His breathing is fine, better. Nurse offered to schedule patient, but caller would rather just get  some advice from Dr. Silvio Pate as to what to do next. Referrals REFERRED TO PCP OFFICE Disagree/Comply: Comply

## 2015-12-12 NOTE — Telephone Encounter (Signed)
I spoke with Kurt Aguilar (DPR signed) and notified as instructed; Kurt Macri said pt does not need to go to ED and request appt; scheduled with [name] 12/12/15 at 4:30. Kurt Drayton agreed if pt condition changes or worsens prior to appt to take pt to ED.

## 2015-12-12 NOTE — Progress Notes (Signed)
Pre visit review using our clinic review tool, if applicable. No additional management support is needed unless otherwise documented below in the visit note. 

## 2015-12-12 NOTE — Progress Notes (Signed)
Subjective:    Patient ID: Kurt Aguilar, male    DOB: 04-27-1946, 70 y.o.   MRN: [id number]  HPI Here for f/u of pneumonia (likely atypical) and also low bp  Seen by Dr Silvio Pate and tx for pneuonia on 3/27 and 3/30 Rocephin and then levaquin  Tessalon and phenergan with codiene  DG Chest 2 View   Status: Final result       PACS Images     Show images for DG Chest 2 View     Study Result     CLINICAL DATA: Shortness of breath and cough for 1 month  EXAM: CHEST 2 VIEW  COMPARISON: August 12, 2010  FINDINGS: There is patchy infiltrate in both lower lobes, somewhat more on the right than on the left. Lungs elsewhere clear. Heart size and pulmonary vascularity are normal. No adenopathy. Patient is status post internal mammary bypass grafting. There is degenerative change in the thoracic spine.  IMPRESSION: Patchy infiltrate both lower lobes, somewhat more on the right than on the left. Suspect pneumonia as most likely etiology. Lungs elsewhere clear. No change in cardiac silhouette.  Followup PA and lateral chest radiographs recommended in 3-4 weeks following trial of antibiotic therapy to ensure resolution and exclude underlying malignancy.  These results will be called to the ordering clinician or representative by the Radiologist Assistant, and communication documented in the PACS or zVision Dashboard.   Electronically Signed  By: Lowella Grip III M.D.  On: 12/03/2015 13:52           Vitals     Height Weight BMI (Calculated)    5' 10.5" (1.791 m) 229 lb 8 oz (104.101 kg) 32.5      External Result Report     External Result Report     Imaging     Imaging Information     Patient Result Comments     Entered by Venia Carbon, MD at 12/03/2015 1:56 PM    Here is the official report for your records      Signed by     Signed Date/Time   Phone Pager    Erling Conte 12/03/2015 1:52  PM [phone]       Exam Information     Status Exam Begun   Exam Ended      Final [99] 12/03/2015 12:57 PM 12/03/2015 1:52 PM        Signed  Cough got better for a while and started coming back today  Chest feels a little tight/ burning feeling in chest  No wheezing  Finished abx today and prednisone yesterday (2nd round)   Really wiped out in general   Has not had any cough medicine today  Usually takes codeine cough med only at night   He held all his med last night except his gerd med -omeprazole  Did not take any of his bp medicines   Is using his inhaler- the albuterol and the advair - (up until today)  bp has been low at home -as low as 92/48 last night  Now it is 116/72 (without bp medicines)  Pulse has been going up - 90 yesterday and 115 this am   Is drinking fluids 20 oz water 3-4 times per day and a coke today  Is eating - appetite is ok -due to the prednisone  Glucose is likely up     Patient Active Problem List   Diagnosis Date Noted  . Fatigue 12/12/2015  . Hypotension 12/12/2015  . Lobar pneumonia ([place]) 12/03/2015  . Cough 11/29/2015  . Cystic kidney disease 10/01/2015  . Renal lesion 09/27/2015  . Erectile dysfunction of organic origin 09/27/2015  . BPH with obstruction/lower urinary tract symptoms 09/27/2015  . Constipation 09/24/2015  . Blood in semen 09/24/2015  . Cyst of right kidney 09/24/2015  . Leukocytosis 09/24/2015  . CAD (coronary artery disease) 06/08/2015  . Grieving 11/22/2014  . Left knee injury 06/19/2014  . Encounter for Medicare annual  wellness exam 07/11/2013  . Prostate cancer screening 07/03/2013  . Right bundle branch block 02/22/2013  . Chronic low back pain 05/13/2011  . Obstructive sleep apnea 03/15/2010  . Hyperlipidemia 04/12/2009  . Gout 04/12/2009  . Essential hypertension 04/12/2009  . CAD (coronary artery disease) of artery bypass graft 04/12/2009  . GERD 04/12/2009  . Renal insufficiency 04/12/2009  . Diabetes type 2, controlled ([place]) 04/12/2009  . DIVERTICULITIS, HX OF 04/12/2009   Past Medical History  Diagnosis Date  . Diverticulitis   . Gout   . Hyperlipidemia   . Hypertension   . Osteoporosis   . GERD (gastroesophageal reflux disease)   . Atypical migraine   . Hyperglycemia   . Arthritis   . Diabetes ([place])   . Heart attack ([place])   . Heart disease   . Sleep apnea    Past Surgical History  Procedure Laterality Date  . Open heart surgery  09/09/1995-1998    5 bypass  . Tonsillectomy    . Repair knee ligament      right  . Cataract extraction Bilateral    Social History  Substance Use Topics  . Smoking status: Former Smoker -- 1.00 packs/day for 3 years    Types: Cigars    Quit date: 11/25/2004  . Smokeless tobacco: None     Comment: occas. cigar quit 2006  . Alcohol Use: 0.6 oz/week    1 Standard drinks or equivalent per week     Comment: rare   Family History  Problem Relation Age of Onset  . Heart disease Mother   . Heart disease Father   . Stroke Father   . Heart attack Son 72  . Kidney disease Neg Hx   . Prostate cancer Neg Hx    Allergies  Allergen Reactions  . Oxycodone-Acetaminophen     Stops breathing   Current Outpatient Prescriptions on File Prior to Visit  Medication Sig Dispense Refill  .  omeprazole (PRILOSEC) 20 MG capsule Take 1 capsule (20 mg total) by mouth 2 (two) times daily. 180 capsule 3  . albuterol (VENTOLIN HFA) 108 (90 Base) MCG/ACT inhaler Inhale 2 puffs into the lungs every 4 (four) hours as needed for wheezing or shortness of breath (tight  lungs ). (Patient not taking: Reported on 12/12/2015) 1 Inhaler prn  . allopurinol (ZYLOPRIM) 100 MG tablet Take 1 tablet (100 mg total) by mouth 2 (two) times daily. (Patient not taking: Reported on 12/12/2015) 180 tablet 3  . aspirin 81 MG tablet Reported on 12/12/2015    . benzonatate (TESSALON) 200 MG capsule Take 1 capsule (200 mg total) by mouth 3 (three) times daily as needed for cough. (Patient not taking: Reported on 12/12/2015) 60 capsule 0  . chlorthalidone (HYGROTON) 25 MG tablet Take 1 tablet (25 mg total) by mouth daily. (Patient not taking: Reported on 12/12/2015) 90 tablet 3  . colchicine 0.6 MG tablet Take 1 tablet (0.6 mg total) by mouth 2 (two) times daily as needed. With a meal as needed for acute flare of gout (Patient not taking: Reported on 12/12/2015) 45 tablet 1  . DHA-EPA-Vitamin E (OMEGA-3 COMPLEX PO) Take 4 tablets by mouth daily. Reported on 12/12/2015    . ezetimibe (ZETIA) 10 MG tablet Take 1 tablet (10 mg total) by mouth daily. (Patient not taking: Reported on 12/12/2015) 90 tablet 3  . Fluticasone-Salmeterol (ADVAIR) 100-50 MCG/DOSE AEPB Inhale 1 puff into the lungs 2 (two) times daily. (Patient not taking: Reported on 12/12/2015) 1 each 3  . furosemide (LASIX) 20 MG tablet Take 10 mg by mouth daily as needed.    . Glucosamine HCl (GLUCOSAMINE PO) Take 1 tablet by mouth daily. Reported on 12/12/2015    . Lutein 20 MG CAPS Take 1 capsule by mouth daily. Reported on 12/12/2015    . metoprolol tartrate (LOPRESSOR) 25 MG tablet TAKE 1 TABLET 2 TIMES DAILY. (Patient not taking: Reported on 12/12/2015) 180 tablet 3  . NON FORMULARY Take 1 tablet by mouth 4 (four) times daily. Reported on 12/12/2015    . NON FORMULARY Take 1 tablet by mouth 2 (two) times daily. Reported on 12/12/2015    . polycarbophil (FIBERCON) 625 MG tablet Take 625 mg by mouth daily. Reported on 12/12/2015    . potassium chloride SA (K-DUR,KLOR-CON) 20 MEQ tablet Take 1 tablet (20 mEq total) by mouth daily. (Patient not taking:  Reported on 12/12/2015) 90 tablet 3  . predniSONE (DELTASONE) 20 MG tablet Take 2 tablets (40 mg total) by mouth daily. (Patient not taking: Reported on 12/12/2015) 15 tablet 0  . PRODIGY LANCETS 26G MISC 1 each by Does not apply route daily. To check glucose qd and prn for DM2 (250.00) (Patient not taking: Reported on 12/12/2015) 100 each 3  . PRODIGY NO CODING BLOOD GLUC test strip CHECK  BLOOD  GLUCOSE ONE TIME DAILY  AND AS NEEDED (Patient not taking: Reported on 12/12/2015) 200 each 3  . promethazine-codeine (PHENERGAN WITH CODEINE) 6.25-10 MG/5ML syrup Take 5 mLs by mouth every 6 (six) hours as needed for cough. (Patient not taking: Reported on 12/12/2015) 180 mL 0  . rosuvastatin (CRESTOR) 40 MG tablet Take 1 tablet (40 mg total) by mouth daily. (Patient not taking: Reported on 12/12/2015) 90 tablet 3  . tadalafil (CIALIS) 20 MG tablet Take 1 tablet (20 mg total) by mouth daily as needed for erectile dysfunction. (Patient not taking: Reported on 12/12/2015) 10 tablet 12  . vitamin C (ASCORBIC ACID)  500 MG tablet Take 1,000 mg by mouth daily. Reported on 12/12/2015     No current facility-administered medications on file prior to visit.     Review of Systems Review of Systems  Constitutional: Negative for fever, appetite change, and unexpected weight change. pos for fatigue and generalized weakness Eyes: Negative for pain and visual disturbance.  Respiratory: Negative for wheeze and shortness of breath.  pos for mild cough Cardiovascular: Negative for cp or palpitations   pos for hypotension Gastrointestinal: Negative for nausea, diarrhea and constipation.  Genitourinary: Negative for urgency and frequency.  Skin: Negative for pallor or rash   Neurological: Negative for weakness, light-headedness, numbness and headaches.  Hematological: Negative for adenopathy. Does not bruise/bleed easily.  Psychiatric/Behavioral: Negative for dysphoric mood. The patient is not nervous/anxious.         Objective:     Physical Exam  Constitutional: He appears well-developed and well-nourished. No distress.  Fatigued appearing - in good spirits  HENT:  Head: Normocephalic and atraumatic.  Right Ear: External ear normal.  Left Ear: External ear normal.  Mouth/Throat: Oropharynx is clear and moist. No oropharyngeal exudate.  Nares are boggy  MMM  Eyes: Conjunctivae and EOM are normal. Pupils are equal, round, and reactive to light. Right eye exhibits no discharge. Left eye exhibits no discharge.  Neck: Normal range of motion. Neck supple.  Cardiovascular: Regular rhythm and normal heart sounds.   Rate of 93 on exam after sitting  Pulmonary/Chest: Effort normal and breath sounds normal. No respiratory distress. He has no wheezes. He has no rales. He exhibits no tenderness.  Harsh bs No rales  Good air exch  occ dry cough   Abdominal: Soft. Bowel sounds are normal. He exhibits no distension. There is no tenderness.  Musculoskeletal: He exhibits no edema.  Lymphadenopathy:    He has no cervical adenopathy.  Neurological: He is alert. He has normal reflexes.  No tremor  Skin: Skin is warm and dry. No rash noted. No erythema. No pallor.  No pallor  Brisk capillary refill time and good skin turgor    Psychiatric: He has a normal mood and affect.          Assessment & Plan:  . Problem List Items Addressed This Visit      Cardiovascular and Mediastinum   Hypotension    S/p tx for pneumonia with levaquin and a prednisone course  Held bp meds  bp is improved now-though pt feels weak  Reassuring exam and vitals/not dehydrated appearing (is eating and drinking)  Labs today  CXR is improved  Plan from there  Adv to go to ED if symptoms worsen         Respiratory   Lobar pneumonia ([place])    Clinically improved re: cough and fever  Still weak however and bp has been low at home  Reassuring exam today and cxr is improved Labs done- will make plan from there If condition worsens will go  to ED        Relevant Orders   DG Chest 2 View (Completed)     Other   Fatigue - Primary    And weakness s/p tx for pneumonia  Lab today      Relevant Orders   CBC with Differential/Platelet (Completed)   Comprehensive metabolic panel (Completed)   TSH (Completed)   Vitamin B12 (Completed)    Other Visit Diagnoses    Diabetes mellitus without complication ([place])        Relevant  Orders    POCT glucose (manual entry) (Completed)

## 2015-12-12 NOTE — Telephone Encounter (Signed)
It sounds like he needs to be seen again. If his breathing is better, we can just see him here and decide if further evaluation is needed. If he really feels bad, he should proceed to ER

## 2015-12-12 NOTE — Telephone Encounter (Signed)
I will see him then

## 2015-12-12 NOTE — Patient Instructions (Addendum)
Glucose is ok at 155 Keep eating and keep drinking  Take your acid reflux medicine but hold all your other pills Labs today  If much worse tonight -go to the ER  Otherwise - will make plan tomorrow when plans return

## 2015-12-13 ENCOUNTER — Telehealth: Payer: Self-pay | Admitting: Family Medicine

## 2015-12-13 LAB — COMPREHENSIVE METABOLIC PANEL
ALT: 30 U/L (ref 0–53)
AST: 17 U/L (ref 0–37)
Albumin: 3.9 g/dL (ref 3.5–5.2)
Alkaline Phosphatase: 66 U/L (ref 39–117)
BUN: 38 mg/dL — AB (ref 6–23)
CHLORIDE: 98 meq/L (ref 96–112)
CO2: 26 mEq/L (ref 19–32)
Calcium: 9.5 mg/dL (ref 8.4–10.5)
Creatinine, Ser: 1.45 mg/dL (ref 0.40–1.50)
GFR: 50.81 mL/min — ABNORMAL LOW (ref >60.00)
GLUCOSE: 157 mg/dL — AB (ref 70–99)
POTASSIUM: 3.9 meq/L (ref 3.5–5.1)
SODIUM: 135 meq/L (ref 135–145)
TOTAL PROTEIN: 6.7 g/dL (ref 6.0–8.3)
Total Bilirubin: 0.6 mg/dL (ref 0.2–1.2)

## 2015-12-13 LAB — CBC WITH DIFFERENTIAL/PLATELET
Basophils Absolute: 0.1 10*3/uL (ref 0.0–0.1)
Basophils Relative: 0.5 % (ref 0.0–3.0)
EOS PCT: 1.7 % (ref 0.0–5.0)
Eosinophils Absolute: 0.2 10*3/uL (ref 0.0–0.7)
HCT: 45.3 % (ref 39.0–52.0)
Hemoglobin: 15.7 g/dL (ref 13.0–17.0)
LYMPHS ABS: 2.6 10*3/uL (ref 0.7–4.0)
Lymphocytes Relative: 21.4 % (ref 12.0–46.0)
MCHC: 34.7 g/dL (ref 30.0–36.0)
MCV: 89.4 fl (ref 78.0–100.0)
MONO ABS: 0.8 10*3/uL (ref 0.1–1.0)
Monocytes Relative: 6.6 % (ref 3.0–12.0)
NEUTROS PCT: 69.8 % (ref 43.0–77.0)
Neutro Abs: 8.5 10*3/uL — ABNORMAL HIGH (ref 1.4–7.7)
Platelets: 327 10*3/uL (ref 150.0–400.0)
RBC: 5.07 Mil/uL (ref 4.22–5.81)
RDW: 14.2 % (ref 11.5–15.5)
WBC: 12.2 10*3/uL — ABNORMAL HIGH (ref 4.0–10.5)

## 2015-12-13 LAB — VITAMIN B12: Vitamin B-12: 452 pg/mL (ref 211–911)

## 2015-12-13 LAB — TSH: TSH: 3.11 u[IU]/mL (ref 0.35–4.50)

## 2015-12-13 NOTE — Assessment & Plan Note (Signed)
Clinically improved re: cough and fever  Still weak however and bp has been low at home  Reassuring exam today and cxr is improved Labs done- will make plan from there If condition worsens will go to ED

## 2015-12-13 NOTE — Assessment & Plan Note (Signed)
And weakness s/p tx for pneumonia  Lab today

## 2015-12-13 NOTE — Assessment & Plan Note (Signed)
S/p tx for pneumonia with levaquin and a prednisone course  Held bp meds  bp is improved now-though pt feels weak  Reassuring exam and vitals/not dehydrated appearing (is eating and drinking)  Labs today  CXR is improved  Plan from there  Adv to go to ED if symptoms worsen

## 2015-12-13 NOTE — Telephone Encounter (Signed)
Spoke with pt and his wife- feeling a little better today - slept a lot and that was restful Coughs when he talks too much  No fever  bp was low again this am but normalized now  Continues to hold his bp meds  Enc to keep holding them  Keep eating and drinking fluids  Will check in tomorrow If worse overnight will go to the ED   We will check in tomorrow

## 2015-12-14 DIAGNOSIS — G4733 Obstructive sleep apnea (adult) (pediatric): Secondary | ICD-10-CM | POA: Diagnosis not present

## 2015-12-14 MED ORDER — LEVOFLOXACIN 500 MG PO TABS: 500.0000 mg | ORAL_TABLET | Freq: Every day | ORAL | Status: DC

## 2015-12-14 NOTE — Telephone Encounter (Signed)
Thanks for the update - that sounds a bit better In light of the cough I do want to extend his levaquin another 5 days- [facility] If worse -as before/ go to ED F/u with me when I am back in town -- if necessary while I'm gone- with first avail provider  Stay off bp medicines for now  Thanks

## 2015-12-14 NOTE — Telephone Encounter (Signed)
Please check and see how he is feeling today Thanks

## 2015-12-14 NOTE — Telephone Encounter (Signed)
Spoke with wife and she gave me BP reading from yesterday and today  Yesterday BP readings were: 96/66, 116/70, 130/75 pulse 92  Today BP readings: 100/69 pulse 97, 97/55 pulse 91, 126/72 pulse 100,   Pt said he is feeling a little better today but his cough is starting to come back and wife worried it is worsening

## 2015-12-14 NOTE — Telephone Encounter (Signed)
Pt notified of DR. Tower's comments and instructions. Pt will get abx and call back and schedule a f/u with Dr. Glori Bickers when she returns pt declined to schedule one now. Pt advised to go to ER or f/u with another provider if sxs worsen

## 2015-12-18 ENCOUNTER — Ambulatory Visit (INDEPENDENT_AMBULATORY_CARE_PROVIDER_SITE_OTHER): Payer: PPO | Admitting: Family Medicine

## 2015-12-18 ENCOUNTER — Telehealth: Payer: Self-pay | Admitting: Cardiovascular Disease

## 2015-12-18 ENCOUNTER — Encounter: Payer: Self-pay | Admitting: Family Medicine

## 2015-12-18 VITALS — BP 124/60 | HR 115 | Temp 98.5°F | Wt 226.0 lb

## 2015-12-18 DIAGNOSIS — R05 Cough: Secondary | ICD-10-CM | POA: Diagnosis not present

## 2015-12-18 DIAGNOSIS — J181 Lobar pneumonia, unspecified organism: Secondary | ICD-10-CM | POA: Diagnosis not present

## 2015-12-18 DIAGNOSIS — R0602 Shortness of breath: Secondary | ICD-10-CM | POA: Diagnosis not present

## 2015-12-18 DIAGNOSIS — R000 Tachycardia, unspecified: Secondary | ICD-10-CM

## 2015-12-18 DIAGNOSIS — R059 Cough, unspecified: Secondary | ICD-10-CM

## 2015-12-18 LAB — CBC WITH DIFFERENTIAL/PLATELET
BASOS ABS: 0.1 10*3/uL (ref 0.0–0.1)
Basophils Relative: 0.4 % (ref 0.0–3.0)
Eosinophils Absolute: 0.2 10*3/uL (ref 0.0–0.7)
Eosinophils Relative: 1.5 % (ref 0.0–5.0)
HEMATOCRIT: 42.4 % (ref 39.0–52.0)
HEMOGLOBIN: 14.3 g/dL (ref 13.0–17.0)
LYMPHS PCT: 16.6 % (ref 12.0–46.0)
Lymphs Abs: 2.3 10*3/uL (ref 0.7–4.0)
MCHC: 33.8 g/dL (ref 30.0–36.0)
MCV: 89.7 fl (ref 78.0–100.0)
MONOS PCT: 12.3 % — AB (ref 3.0–12.0)
Monocytes Absolute: 1.7 10*3/uL — ABNORMAL HIGH (ref 0.1–1.0)
NEUTROS ABS: 9.6 10*3/uL — AB (ref 1.4–7.7)
NEUTROS PCT: 69.2 % (ref 43.0–77.0)
PLATELETS: 207 10*3/uL (ref 150.0–400.0)
RBC: 4.73 Mil/uL (ref 4.22–5.81)
RDW: 14.5 % (ref 11.5–15.5)
WBC: 13.9 10*3/uL — AB (ref 4.0–10.5)

## 2015-12-18 LAB — COMPREHENSIVE METABOLIC PANEL
ALBUMIN: 3.6 g/dL (ref 3.5–5.2)
ALT: 16 U/L (ref 0–53)
AST: 16 U/L (ref 0–37)
Alkaline Phosphatase: 64 U/L (ref 39–117)
BILIRUBIN TOTAL: 1 mg/dL (ref 0.2–1.2)
BUN: 20 mg/dL (ref 6–23)
CALCIUM: 9.5 mg/dL (ref 8.4–10.5)
CO2: 25 meq/L (ref 19–32)
CREATININE: 1.41 mg/dL (ref 0.40–1.50)
Chloride: 99 mEq/L (ref 96–112)
GFR: 52.48 mL/min — ABNORMAL LOW (ref >60.00)
Glucose, Bld: 122 mg/dL — ABNORMAL HIGH (ref 70–99)
Potassium: 3.6 mEq/L (ref 3.5–5.1)
SODIUM: 135 meq/L (ref 135–145)
Total Protein: 7.1 g/dL (ref 6.0–8.3)

## 2015-12-18 LAB — BRAIN NATRIURETIC PEPTIDE: PRO B NATRI PEPTIDE: 18 pg/mL (ref 0.0–100.0)

## 2015-12-18 NOTE — Progress Notes (Signed)
Pre visit review using our clinic review tool, if applicable. No additional management support is needed unless otherwise documented below in the visit note. 

## 2015-12-18 NOTE — Telephone Encounter (Signed)
[place] calling stating pt needs to be seen soon for Tachycardia Pt see's Dr [name]  Placed patient on schedule for 12/26/15 to see [place]. Please advise if patient can be seen sooner.

## 2015-12-18 NOTE — Telephone Encounter (Signed)
Pt sched to see Dr. [name] tomorrow @ 2:00.

## 2015-12-18 NOTE — Telephone Encounter (Signed)
Pt placed on waiting list to call in the event of a cancellation.

## 2015-12-18 NOTE — Patient Instructions (Signed)
Great to see you. Please stop by to see Rosaria Ferries after you go to the lab.

## 2015-12-18 NOTE — Assessment & Plan Note (Signed)
>  25 minutes spent in face to face time with patient, >50% spent in counselling or coordination of care I am concerned about his tachycardia and persistent SOB. Will ask his cardiology and pulmonologist for urgent evaluation-  ?repeat echo- last done in 2010. Labs today including BNP and Ddimer given recent travel. The patient indicates understanding of these issues and agrees with the plan. Orders Placed This Encounter  Procedures  . Brain natriuretic peptide  . D-dimer, quantitative (not at [facility])  . CBC with Differential/Platelet  . Comprehensive metabolic panel  . Ambulatory referral to Cardiology  . Ambulatory referral to Pulmonology

## 2015-12-18 NOTE — Assessment & Plan Note (Signed)
Resolving based on chest xray but persistent symptoms.  See below.

## 2015-12-18 NOTE — Progress Notes (Signed)
Subjective:   Patient ID: Kurt Aguilar, male    DOB: Sep 21, 1945, 70 y.o.   MRN: [id number]  Kurt Aguilar is a pleasant 70 y.o. year old male pt of Dr. Glori Bickers who presents to clinic today with Follow-up  on 12/18/2015  HPI:  Recent quite complicated history.  Notes reviewed.  This is his fourth visit for persistent cough. Initially seen on 11/15/15 by Delano Metz for cough and wheezing- given albuterol and prednsione.  I saw him 4 days later on 11/29/15- at that time, he felt symptoms overall better but still coughing. eRx sent for Advair.  Saw Dr. Silvio Pate 4 days later, 3/27- felt advair has not help. CXR showed PNA- given IM recophenin and levaquin. Dg Chest 2 View  12/12/2015  CLINICAL DATA:  Followup pneumonia, persistent cough and weakness EXAM: CHEST  2 VIEW COMPARISON:  Chest x-ray of 12/03/2015 FINDINGS: The opacities noted previously in the lung bases have cleared. No residual infiltrate or effusion is seen. Mediastinal and hilar contours are unremarkable and cardiomegaly is stable. Median sternotomy sutures are noted from prior CABG. There are degenerative changes throughout the mid to lower thoracic spine. IMPRESSION: Clearing of basilar infiltrates.  No active process. Electronically Signed   By: Ivar Drape M.D.   On: 12/12/2015 17:15   Dg Chest 2 View  12/03/2015  CLINICAL DATA:  Shortness of breath and cough for 1 month EXAM: CHEST  2 VIEW COMPARISON:  August 12, 2010 FINDINGS: There is patchy infiltrate in both lower lobes, somewhat more on the right than on the left. Lungs elsewhere clear. Heart size and pulmonary vascularity are normal. No adenopathy. Patient is status post internal mammary bypass grafting. There is degenerative change in the thoracic spine. IMPRESSION: Patchy infiltrate both lower lobes, somewhat more on the right than on the left. Suspect pneumonia as most likely etiology. Lungs elsewhere clear. No change in cardiac silhouette. Followup PA and  lateral chest radiographs recommended in 3-4 weeks following trial of antibiotic therapy to ensure resolution and exclude underlying malignancy. These results will be called to the ordering clinician or representative by the Radiologist Assistant, and communication documented in the PACS or zVision Dashboard. Electronically Signed   By: Lowella Grip III M.D.   On: 12/03/2015 13:52   Saw PCP, Dr. Glori Bickers on 4/5- Repeat CXR showed clearing.  No further rx given.  Pt called on 4/6 because cough worsening- Dr. Glori Bickers extended course of Levaquin for another 5 days.  He is still short of breath, fatigued and cough is persistent.  Current Outpatient Prescriptions on File Prior to Visit  Medication Sig Dispense Refill  . albuterol (VENTOLIN HFA) 108 (90 Base) MCG/ACT inhaler Inhale 2 puffs into the lungs every 4 (four) hours as needed for wheezing or shortness of breath (tight lungs ). 1 Inhaler prn  . allopurinol (ZYLOPRIM) 100 MG tablet Take 1 tablet (100 mg total) by mouth 2 (two) times daily. 180 tablet 3  . aspirin 81 MG tablet Reported on 12/12/2015    . benzonatate (TESSALON) 200 MG capsule Take 1 capsule (200 mg total) by mouth 3 (three) times daily as needed for cough. 60 capsule 0  . chlorthalidone (HYGROTON) 25 MG tablet Take 1 tablet (25 mg total) by mouth daily. 90 tablet 3  . colchicine 0.6 MG tablet Take 1 tablet (0.6 mg total) by mouth 2 (two) times daily as needed. With a meal as needed for acute flare of gout 45 tablet 1  . DHA-EPA-Vitamin E (  OMEGA-3 COMPLEX PO) Take 4 tablets by mouth daily. Reported on 12/12/2015    . ezetimibe (ZETIA) 10 MG tablet Take 1 tablet (10 mg total) by mouth daily. 90 tablet 3  . Fluticasone-Salmeterol (ADVAIR) 100-50 MCG/DOSE AEPB Inhale 1 puff into the lungs 2 (two) times daily. 1 each 3  . Glucosamine HCl (GLUCOSAMINE PO) Take 1 tablet by mouth daily. Reported on 12/12/2015    . levofloxacin (LEVAQUIN) 500 MG tablet Take 1 tablet (500 mg total) by mouth  daily. 5 tablet 0  . Lutein 20 MG CAPS Take 1 capsule by mouth daily. Reported on 12/12/2015    . metoprolol tartrate (LOPRESSOR) 25 MG tablet TAKE 1 TABLET 2 TIMES DAILY. 180 tablet 3  . NON FORMULARY Take 1 tablet by mouth 4 (four) times daily. Reported on 12/12/2015    . NON FORMULARY Take 1 tablet by mouth 2 (two) times daily. Reported on 12/12/2015    . omeprazole (PRILOSEC) 20 MG capsule Take 1 capsule (20 mg total) by mouth 2 (two) times daily. 180 capsule 3  . polycarbophil (FIBERCON) 625 MG tablet Take 625 mg by mouth daily. Reported on 12/12/2015    . potassium chloride SA (K-DUR,KLOR-CON) 20 MEQ tablet Take 1 tablet (20 mEq total) by mouth daily. 90 tablet 3  . PRODIGY LANCETS 26G MISC 1 each by Does not apply route daily. To check glucose qd and prn for DM2 (250.00) 100 each 3  . PRODIGY NO CODING BLOOD GLUC test strip CHECK  BLOOD  GLUCOSE ONE TIME DAILY  AND AS NEEDED 200 each 3  . promethazine-codeine (PHENERGAN WITH CODEINE) 6.25-10 MG/5ML syrup Take 5 mLs by mouth every 6 (six) hours as needed for cough. 180 mL 0  . rosuvastatin (CRESTOR) 40 MG tablet Take 1 tablet (40 mg total) by mouth daily. 90 tablet 3  . tadalafil (CIALIS) 20 MG tablet Take 1 tablet (20 mg total) by mouth daily as needed for erectile dysfunction. 10 tablet 12  . vitamin C (ASCORBIC ACID) 500 MG tablet Take 1,000 mg by mouth daily. Reported on 12/12/2015    . furosemide (LASIX) 20 MG tablet Take 10 mg by mouth daily as needed.     No current facility-administered medications on file prior to visit.    Allergies  Allergen Reactions  . Oxycodone-Acetaminophen     Stops breathing    Past Medical History  Diagnosis Date  . Diverticulitis   . Gout   . Hyperlipidemia   . Hypertension   . Osteoporosis   . GERD (gastroesophageal reflux disease)   . Atypical migraine   . Hyperglycemia   . Arthritis   . Diabetes ([place])   . Heart attack ([place])   . Heart disease   . Sleep apnea     Past Surgical History    Procedure Laterality Date  . Open heart surgery  09/09/1995-1998    5 bypass  . Tonsillectomy    . Repair knee ligament      right  . Cataract extraction Bilateral     Family History  Problem Relation Age of Onset  . Heart disease Mother   . Heart disease Father   . Stroke Father   . Heart attack Son 46  . Kidney disease Neg Hx   . Prostate cancer Neg Hx     Social History   Social History  . Marital Status: Married    Spouse Name: N/A  . Number of Children: 0  . Years of Education: N/A  Occupational History  . Restaurant owner    Social History Main Topics  . Smoking status: Former Smoker -- 1.00 packs/day for 3 years    Types: Cigars    Quit date: 11/25/2004  . Smokeless tobacco: Not on file     Comment: occas. cigar quit 2006  . Alcohol Use: 0.6 oz/week    1 Standard drinks or equivalent per week     Comment: rare  . Drug Use: No  . Sexual Activity: Not on file   Other Topics Concern  . Not on file   Social History Narrative   The PMH, PSH, Social History, Family History, Medications, and allergies have been reviewed in [facility], and have been updated if relevant.    Review of Systems  Constitutional: Positive for fatigue. Negative for fever.  HENT: Negative.   Respiratory: Positive for cough. Negative for wheezing.   Cardiovascular: Positive for palpitations. Negative for chest pain and leg swelling.  Gastrointestinal: Negative.   Musculoskeletal: Positive for myalgias.  Neurological: Negative.        Objective:    BP 124/60 mmHg  Pulse 115  Temp(Src) 98.5 F (36.9 C) (Oral)  Wt 226 lb (102.513 kg)  SpO2 90% Wt Readings from Last 3 Encounters:  12/18/15 226 lb (102.513 kg)  12/12/15 229 lb 8 oz (104.101 kg)  12/06/15 229 lb (103.874 kg)     Physical Exam  Constitutional: He is oriented to person, place, and time.  Appears tired  HENT:  Head: Normocephalic.  Eyes: Conjunctivae are normal.  Cardiovascular: Tachycardia present.    Pulmonary/Chest:  Unable to take deep inspiration due to cough  Neurological: He is alert and oriented to person, place, and time.  Skin: There is pallor.  ?jaundice  Psychiatric: He has a normal mood and affect. His behavior is normal. Judgment and thought content normal.          Assessment & Plan:   Cough  Lobar pneumonia (HCC) No Follow-up on file.

## 2015-12-19 ENCOUNTER — Ambulatory Visit (INDEPENDENT_AMBULATORY_CARE_PROVIDER_SITE_OTHER): Payer: PPO | Admitting: Internal Medicine

## 2015-12-19 ENCOUNTER — Ambulatory Visit (INDEPENDENT_AMBULATORY_CARE_PROVIDER_SITE_OTHER): Payer: PPO | Admitting: Cardiovascular Disease

## 2015-12-19 ENCOUNTER — Encounter: Payer: Self-pay | Admitting: Cardiovascular Disease

## 2015-12-19 ENCOUNTER — Encounter: Payer: Self-pay | Admitting: Internal Medicine

## 2015-12-19 ENCOUNTER — Other Ambulatory Visit (INDEPENDENT_AMBULATORY_CARE_PROVIDER_SITE_OTHER): Payer: PPO

## 2015-12-19 ENCOUNTER — Ambulatory Visit
Admission: RE | Admit: 2015-12-19 | Discharge: 2015-12-19 | Disposition: A | Discharge status: Home / Self Care | Payer: PPO | Source: Ambulatory Visit | Attending: Family Medicine | Admitting: Family Medicine

## 2015-12-19 ENCOUNTER — Encounter (INDEPENDENT_AMBULATORY_CARE_PROVIDER_SITE_OTHER): Payer: PPO | Admitting: Cardiovascular Disease

## 2015-12-19 ENCOUNTER — Other Ambulatory Visit: Payer: Self-pay | Admitting: Family Medicine

## 2015-12-19 VITALS — BP 118/82 | HR 98 | Ht 70.0 in | Wt 227.0 lb

## 2015-12-19 VITALS — BP 110/64 | HR 91 | Ht 70.0 in | Wt 229.2 lb

## 2015-12-19 DIAGNOSIS — R791 Abnormal coagulation profile: Secondary | ICD-10-CM | POA: Diagnosis not present

## 2015-12-19 DIAGNOSIS — I2510 Atherosclerotic heart disease of native coronary artery without angina pectoris: Secondary | ICD-10-CM | POA: Diagnosis not present

## 2015-12-19 DIAGNOSIS — R918 Other nonspecific abnormal finding of lung field: Secondary | ICD-10-CM | POA: Insufficient documentation

## 2015-12-19 DIAGNOSIS — Q613 Polycystic kidney, unspecified: Secondary | ICD-10-CM | POA: Diagnosis not present

## 2015-12-19 DIAGNOSIS — Q446 Cystic disease of liver: Secondary | ICD-10-CM | POA: Insufficient documentation

## 2015-12-19 DIAGNOSIS — I10 Essential (primary) hypertension: Secondary | ICD-10-CM | POA: Diagnosis not present

## 2015-12-19 DIAGNOSIS — R0602 Shortness of breath: Secondary | ICD-10-CM | POA: Diagnosis not present

## 2015-12-19 DIAGNOSIS — I25810 Atherosclerosis of coronary artery bypass graft(s) without angina pectoris: Secondary | ICD-10-CM

## 2015-12-19 DIAGNOSIS — R7989 Other specified abnormal findings of blood chemistry: Secondary | ICD-10-CM

## 2015-12-19 DIAGNOSIS — R059 Cough, unspecified: Secondary | ICD-10-CM

## 2015-12-19 DIAGNOSIS — R05 Cough: Secondary | ICD-10-CM | POA: Diagnosis not present

## 2015-12-19 LAB — D-DIMER, QUANTITATIVE: D-Dimer, Quant: 0.61 ug/mL-FEU — ABNORMAL HIGH (ref 0.00–0.48)

## 2015-12-19 LAB — SEDIMENTATION RATE: Sed Rate: 93 mm/hr — ABNORMAL HIGH (ref 0–22)

## 2015-12-19 MED ORDER — PREDNISONE 10 MG PO TABS: ORAL_TABLET | ORAL | Status: DC

## 2015-12-19 MED ORDER — IOPAMIDOL (ISOVUE-370) INJECTION 76%
75.0000 mL | Freq: Once | INTRAVENOUS | Status: AC | PRN
  Administered 2015-12-19: 75 mL via INTRAVENOUS

## 2015-12-19 MED ORDER — FUROSEMIDE 20 MG PO TABS: 20.0000 mg | ORAL_TABLET | Freq: Every day | ORAL | Status: DC | PRN

## 2015-12-19 NOTE — Assessment & Plan Note (Signed)
Coronary calcifications seen on CT scan of the chest consistent with prior disease, history of bypass surgery. Encouraged aggressive diabetes control as much as can be done given he is on prednisone. Aggressive lipid control with Crestor and zetia

## 2015-12-19 NOTE — Assessment & Plan Note (Signed)
Onset of symptoms feb 2017 with "photo neg pumonary edema pattern" 12/19/2015  ESR 93 > started on prednisone 20 mg daily   ddx = Miscellaneous:Alv microlithiasis, alv proteinosis, asp, bronchiectais, BOOP   ARDS/ AIP Occupational dz/ HSP Neoplasm Infection Drug  Pulmonary emboli, Protein disorders Edema/Eosinophilic dz Sarcoidosis Hist X / Hemorrhage Idiopathic   With no sign eosinophilia or exp favoring hsp, nl bnp and no symptoms at all to suggest atypical infection as well as rapidly clearing them recurring pattern of AS dz with prednisone this is almost certainly BOOP ? Post infection vs idiopathic  The goal with a chronic steroid dependent illness is always arriving at the lowest effective dose that controls the disease/symptoms and not accepting a set "formula" which is based on statistics or guidelines that don't always take into account patient  variability or the natural hx of the dz in every individual patient, which may well vary over time.  For now therefore I recommend the patient maintain  Ceiling of 20 mg and floor of 10 mg per day guided by symptoms and esr  Discussed in detail all the  indications, usual  risks and alternatives  relative to the benefits with patient/wife who agree  to proceed with conservative f/u as outlined  Without subjecting him to bx at this point.  Total time devoted to counseling  = 35/26mreview case with pt/ discussion of options/alternatives/ personally creating in presence of pt  then going over specific  Instructions directly with the pt including how to use all of the meds but in particular covering each new medication in detail (see avs)      .

## 2015-12-19 NOTE — Assessment & Plan Note (Signed)
We spent some time discussing his blood pressure medications. Blood pressure has been running low for the past several weeks the setting of his lung pathology. He is holding most of his medications. Recommended he continue to monitor blood pressure, and we can restart medications slowly as symptoms improve. Would certainly hold beta blockers given potential for bronchospasm   Total encounter time more than 25 minutes  Greater than 50% was spent in counseling and coordination of care with the patient

## 2015-12-19 NOTE — Progress Notes (Signed)
Subjective:    Patient ID: Kurt Aguilar, male    DOB: 01/12/1946,   MRN: [id number]  HPI  42 yowm never smoker after cabg in [place] stated noting recurrent winter cough regardless of where located in [place] ([place]/ [place]) with typical acute episode in [place] in Feb 2017  This time cough  more dry less severe than previous >  abx and pred > improved but did not resolve and worse when stopped it so restarted pred/abx > dx as pneumonia with variable as dz > referred to pulmonary clinic 12/19/2015 by [name].    12/19/2015 1st [facility] office visit/ [facility]   Chief Complaint  Patient presents with  . Pulmonary Consult    Referred by [name]. Pt c/o cough x 4 wks- non prod and worse in the evening and when he lies down. Talking and exertion are things that trigger the cough. He has been on pred x 2 and both times cough resolved and immediately returns once done with med. He also c/o SOB- gets winded just walking from room to room at home.   while on prednisone still some weak and sob but better cough and last dose at least one week and worse overall since off it assoc with sob room to room and weakness/ excess/ purulent sputum or mucus plugs / no unusual exp / no ctd.  Has new CPAP machine fall 2016   No obvious other patterns in day to day or daytime variabilty or assoc  cp or chest tightness, subjective wheeze overt sinus or hb symptoms. No unusual exp hx or h/o childhood pna/ asthma or knowledge of premature birth.   Also denies any obvious fluctuation of symptoms with weather or environmental changes or other aggravating or alleviating factors except as outlined above   Current Medications, Allergies, Complete Past Medical History, Past Surgical History, Family History, and Social History were reviewed in [employer] record.              Review of Systems  Constitutional: Negative for fever, chills, activity change,  appetite change and unexpected weight change.  HENT: Negative for congestion, dental problem, postnasal drip, rhinorrhea, sneezing, sore throat, trouble swallowing and voice change.   Eyes: Negative for visual disturbance.  Respiratory: Positive for cough and shortness of breath. Negative for choking.   Cardiovascular: Negative for chest pain and leg swelling.  Gastrointestinal: Negative for nausea, vomiting and abdominal pain.  Genitourinary: Negative for difficulty urinating.  Musculoskeletal: Negative for arthralgias.  Skin: Negative for rash.  Psychiatric/Behavioral: Negative for behavioral problems and confusion.       Objective:   Physical Exam  Pleasant hoarse amb wm nad  Wt Readings from Last 3 Encounters:  12/19/15 229 lb 4 oz (103.987 kg)  12/19/15 229 lb 4 oz (103.987 kg)  12/19/15 227 lb (102.967 kg)    Vital signs reviewed  HEENT: nl dentition, turbinates, and oropharynx. Nl external ear canals without cough reflex   NECK :  without JVD/Nodes/TM/ nl carotid upstrokes bilaterally   LUNGS: no acc muscle use,  Nl contour chest which is clear to A and P bilaterally without cough on insp or exp maneuvers   CV:  RRR  no s3 or murmur or increase in P2, no edema   ABD:  soft and nontender with nl inspiratory excursion in the supine position. No bruits or organomegaly, bowel sounds nl  MS:  Nl gait/ ext warm without deformities, calf tenderness, cyanosis  or clubbing No obvious joint restrictions   SKIN: warm and dry without lesions    NEURO:  alert, approp, nl sensorium with  no motor deficits    Labs ordered/ reviewed:      Chemistry      Component Value Date/Time   NA 135 12/18/2015 1226   K 3.6 12/18/2015 1226   CL 99 12/18/2015 1226   CO2 25 12/18/2015 1226   BUN 20 12/18/2015 1226   CREATININE 1.41 12/18/2015 1226      Component Value Date/Time   CALCIUM 9.5 12/18/2015 1226   ALKPHOS 64 12/18/2015 1226   AST 16 12/18/2015 1226   ALT 16  12/18/2015 1226   BILITOT 1.0 12/18/2015 1226        Lab Results  Component Value Date   WBC 13.9* 12/18/2015   HGB 14.3 12/18/2015   HCT 42.4 12/18/2015   MCV 89.7 12/18/2015   PLT 207.0 12/18/2015       EOS                         0.2                                                                   12/18/15      Lab Results  Component Value Date   DDIMER 0.61* 12/18/2015      Lab Results  Component Value Date   TSH 3.11 12/12/2015     Lab Results  Component Value Date   PROBNP 18.0 12/18/2015       Lab Results  Component Value Date   ESRSEDRATE 93* 12/19/2015            Assessment & Plan:

## 2015-12-19 NOTE — Assessment & Plan Note (Signed)
He continues to feel poorly, now on long prednisone taper Significant coughing on inspiration on today's exam, being managed by pulmonary

## 2015-12-19 NOTE — Progress Notes (Signed)
Patient ID: Kurt Aguilar, male    DOB: 27-May-1946, 70 y.o.   MRN: [id number]  HPI Comments: 70 yo with history of CAD,  CABG back in 51 in [place], [place], HTN, hyperlipidemia, presents for routine followup of his coronary artery disease. History of gout. Reports baseline creatinine mildly elevated He lost his son in early 2016 to cardiac arrest History of diabetes  In follow-up today, he has had a difficult several months  reports symptoms started with a "bowel blockage" Following that he developed upper respiratory infection, possibly viral. He had significant cough, one episode leading to cough syncope In March 2017 was placed on prednisone for his cough with improvement of symptoms, cough returned after prednisone was discontinued He was started on Advair but this did not seem to help his symptoms In follow-up was placed on prednisone and ABX Symptoms have persisted and he has been placed back on long course of prednisone for symptoms  Recent CT scan of the chest done today Images reviewed with him in detail showing diffuse coronary calcifications of the LAD, mild calcifications in the aorta Images show interstitial disease bilaterally, worse on the right  Reports sugars have been going higher on the prednisone In general he continues to feel poorly, malaise, weak  EKG on today's visit shows normal sinus rhythm with rate 91 bpm, right bundle branch block  Other past medical history  Most recent total cholesterol in 2015 was 180  stress test in March of 2011 shows mildly decreased perfusion in the anterior wall at rest with moderately decreased perfusion with stress. He had no significant symptoms with testing.  Allergies  Allergen Reactions  . Oxycodone-Acetaminophen     Stops breathing    Outpatient Encounter Prescriptions as of 12/19/2015  Medication Sig  . albuterol (VENTOLIN HFA) 108 (90 Base) MCG/ACT inhaler Inhale 2 puffs into the lungs every 4 (four) hours as needed  for wheezing or shortness of breath (tight lungs ). (Patient not taking: Reported on 12/19/2015)  . allopurinol (ZYLOPRIM) 100 MG tablet Take 1 tablet (100 mg total) by mouth 2 (two) times daily. (Patient not taking: Reported on 12/19/2015)  . aspirin 81 MG tablet Reported on 12/19/2015  . benzonatate (TESSALON) 200 MG capsule Take 1 capsule (200 mg total) by mouth 3 (three) times daily as needed for cough. (Patient not taking: Reported on 12/19/2015)  . chlorthalidone (HYGROTON) 25 MG tablet Take 1 tablet (25 mg total) by mouth daily. (Patient not taking: Reported on 12/19/2015)  . colchicine 0.6 MG tablet Take 1 tablet (0.6 mg total) by mouth 2 (two) times daily as needed. With a meal as needed for acute flare of gout (Patient not taking: Reported on 12/19/2015)  . ezetimibe (ZETIA) 10 MG tablet Take 1 tablet (10 mg total) by mouth daily. (Patient not taking: Reported on 12/19/2015)  . Glucosamine HCl (GLUCOSAMINE PO) Take 1 tablet by mouth daily. Reported on 12/19/2015  . Lutein 20 MG CAPS Take 1 capsule by mouth daily. Reported on 12/19/2015  . metoprolol tartrate (LOPRESSOR) 25 MG tablet TAKE 1 TABLET 2 TIMES DAILY. (Patient not taking: Reported on 12/19/2015)  . NON FORMULARY Take 1 tablet by mouth 4 (four) times daily. Reported on 12/19/2015  . NON FORMULARY Take 1 tablet by mouth 2 (two) times daily. Reported on 12/19/2015  . omeprazole (PRILOSEC) 20 MG capsule Take 1 capsule (20 mg total) by mouth 2 (two) times daily.  . polycarbophil (FIBERCON) 625 MG tablet Take 625 mg by mouth daily.  Reported on 12/19/2015  . potassium chloride SA (K-DUR,KLOR-CON) 20 MEQ tablet Take 1 tablet (20 mEq total) by mouth daily. (Patient not taking: Reported on 12/19/2015)  . predniSONE (DELTASONE) 10 MG tablet Take  2 each am until better then 1 each am (Patient not taking: Reported on 12/19/2015)  . PRODIGY LANCETS 26G MISC 1 each by Does not apply route daily. To check glucose qd and prn for DM2 (250.00) (Patient not  taking: Reported on 12/19/2015)  . PRODIGY NO CODING BLOOD GLUC test strip CHECK  BLOOD  GLUCOSE ONE TIME DAILY  AND AS NEEDED (Patient not taking: Reported on 12/19/2015)  . promethazine-codeine (PHENERGAN WITH CODEINE) 6.25-10 MG/5ML syrup Take 5 mLs by mouth every 6 (six) hours as needed for cough. (Patient not taking: Reported on 12/19/2015)  . rosuvastatin (CRESTOR) 40 MG tablet Take 1 tablet (40 mg total) by mouth daily. (Patient not taking: Reported on 12/19/2015)  . tadalafil (CIALIS) 20 MG tablet Take 1 tablet (20 mg total) by mouth daily as needed for erectile dysfunction. (Patient not taking: Reported on 12/19/2015)  . vitamin C (ASCORBIC ACID) 500 MG tablet Take 1,000 mg by mouth daily. Reported on 12/19/2015  . [DISCONTINUED] furosemide (LASIX) 20 MG tablet Take 10 mg by mouth daily as needed. Reported on 12/19/2015   No facility-administered encounter medications on file as of 12/19/2015.    Past Medical History  Diagnosis Date  . Diverticulitis   . Gout   . Hyperlipidemia   . Hypertension   . Osteoporosis   . GERD (gastroesophageal reflux disease)   . Atypical migraine   . Hyperglycemia   . Arthritis   . Diabetes ([place])   . Heart attack ([place])   . Heart disease   . Sleep apnea     Past Surgical History  Procedure Laterality Date  . Open heart surgery  09/09/1995-1998    5 bypass  . Tonsillectomy    . Repair knee ligament      right  . Cataract extraction Bilateral   . Coronary artery bypass graft      [place]    Social History  reports that he quit smoking about 11 years ago. His smoking use included Cigars. He does not have any smokeless tobacco history on file. He reports that he drinks about 0.6 oz of alcohol per week. He reports that he does not use illicit drugs.  Family History family history includes Heart attack (age of onset: 50) in his son; Heart disease in his father and mother; Stroke in his father. There is no history of Kidney disease or  Prostate cancer.   Review of Systems  Constitutional: Positive for fatigue.  Respiratory: Positive for cough and shortness of breath.   Cardiovascular: Negative.   Gastrointestinal: Negative.   Endocrine: Negative.   Musculoskeletal: Positive for arthralgias.  Skin: Negative.   Neurological: Negative.   Hematological: Negative.   Psychiatric/Behavioral: Negative.   All other systems reviewed and are negative.  BP 110/64 mmHg  Pulse 91  Ht 5\' 10"  (1.778 m)  Wt 229 lb 4 oz (103.987 kg)  BMI 32.89 kg/m2  Physical Exam  Constitutional: He is oriented to person, place, and time. He appears well-developed and well-nourished.  HENT:  Head: Normocephalic.  Nose: Nose normal.  Mouth/Throat: Oropharynx is clear and moist.  Eyes: Conjunctivae are normal. Pupils are equal, round, and reactive to light.  Neck: Normal range of motion. Neck supple. No JVD present.  Cardiovascular: Normal rate, regular rhythm, S1 normal,  S2 normal, normal heart sounds and intact distal pulses.  Exam reveals no gallop and no friction rub.   No murmur heard. Pulmonary/Chest: Effort normal. No respiratory distress. He has no wheezes. He has rales. He exhibits no tenderness.  Abdominal: Soft. Bowel sounds are normal. He exhibits no distension. There is no tenderness.  Musculoskeletal: Normal range of motion. He exhibits no edema or tenderness.  Lymphadenopathy:    He has no cervical adenopathy.  Neurological: He is alert and oriented to person, place, and time. Coordination normal.  Skin: Skin is warm and dry. No rash noted. No erythema.  Psychiatric: He has a normal mood and affect. His behavior is normal. Judgment and thought content normal.      Assessment and Plan   Nursing note and vitals reviewed.

## 2015-12-19 NOTE — Assessment & Plan Note (Signed)
Symptoms as above, now on long course of prednisone

## 2015-12-19 NOTE — Patient Instructions (Signed)
Please remember to go to the lab   department downstairs for your tests - we will call you with the results when they are available.    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    It can be treated with medication, but also with lifestyle changes including elevation of the head of your bed (ideally with 6 inch  bed blocks),  Smoking cessation, avoidance of late meals, excessive alcohol, and avoid fatty foods, chocolate, peppermint, colas, red wine, and acidic juices such as orange juice.  NO MINT OR MENTHOL PRODUCTS SO NO COUGH DROPS  USE SUGARLESS CANDY INSTEAD (Jolley ranchers or Stover's or Life Savers) or even ice chips will also do - the key is to swallow to prevent all throat clearing. NO OIL BASED VITAMINS - use powdered substitutes.  Please schedule a follow up office visit in 2 weeks, sooner if needed with cxr on return

## 2015-12-19 NOTE — Patient Instructions (Signed)
You are doing well. No medication changes were made.  Consider restarting your crestor and zetia  Please call us if you have new issues that need to be addressed before your next appt.  Your physician wants you to follow-up in: 6 months.  You will receive a reminder letter in the mail two months in advance. If you don't receive a letter, please call our office to schedule the follow-up appointment.

## 2015-12-20 ENCOUNTER — Ambulatory Visit: Payer: PPO | Admitting: Internal Medicine

## 2015-12-20 LAB — RESPIRATORY ALLERGY PROFILE REGION II ~~LOC~~
Allergen, Comm Silver Birch, t9: <0.1 kU/L
Allergen, Cottonwood, t14: <0.1 kU/L
Allergen, Mulberry, t76: <0.1 kU/L
Aspergillus fumigatus, m3: <0.1 kU/L
Bermuda Grass: <0.1 kU/L
Box Elder IgE: <0.1 kU/L
CAT DANDER: 0.17 kU/L — AB
Cladosporium Herbarum: <0.1 kU/L
Cockroach: <0.1 kU/L
Common Ragweed: <0.1 kU/L
IgE (Immunoglobulin E), Serum: 89 kU/L (ref <115)
Penicillium Notatum: <0.1 kU/L

## 2015-12-20 NOTE — Progress Notes (Signed)
Quick Note:  Spoke with pt and notified of results per Dr. Wert. Pt verbalized understanding and denied any questions.  ______ 

## 2015-12-24 LAB — HYPERSENSITIVITY PNUEMONITIS PROFILE

## 2015-12-26 ENCOUNTER — Ambulatory Visit: Payer: PPO | Admitting: Cardiovascular Disease

## 2016-01-07 ENCOUNTER — Encounter: Payer: Self-pay | Admitting: Internal Medicine

## 2016-01-07 ENCOUNTER — Ambulatory Visit (INDEPENDENT_AMBULATORY_CARE_PROVIDER_SITE_OTHER): Payer: PPO | Admitting: Internal Medicine

## 2016-01-07 ENCOUNTER — Other Ambulatory Visit (INDEPENDENT_AMBULATORY_CARE_PROVIDER_SITE_OTHER): Payer: PPO

## 2016-01-07 ENCOUNTER — Ambulatory Visit (INDEPENDENT_AMBULATORY_CARE_PROVIDER_SITE_OTHER)
Admission: RE | Admit: 2016-01-07 | Discharge: 2016-01-07 | Disposition: A | Discharge status: Home / Self Care | Payer: PPO | Source: Ambulatory Visit | Attending: Internal Medicine | Admitting: Internal Medicine

## 2016-01-07 VITALS — BP 130/78 | HR 95 | Ht 70.5 in | Wt 228.4 lb

## 2016-01-07 DIAGNOSIS — R918 Other nonspecific abnormal finding of lung field: Secondary | ICD-10-CM

## 2016-01-07 DIAGNOSIS — R0602 Shortness of breath: Secondary | ICD-10-CM | POA: Diagnosis not present

## 2016-01-07 DIAGNOSIS — J189 Pneumonia, unspecified organism: Secondary | ICD-10-CM | POA: Diagnosis not present

## 2016-01-07 DIAGNOSIS — R05 Cough: Secondary | ICD-10-CM | POA: Diagnosis not present

## 2016-01-07 LAB — SEDIMENTATION RATE: SED RATE: 18 mm/h (ref 0–22)

## 2016-01-07 NOTE — Progress Notes (Signed)
Subjective:    Patient ID: Kurt Aguilar, male    DOB: 1946/05/02,   MRN: [id number]    Brief patient profile:  74 yowm never smoker after cabg in [place] started noting recurrent winter cough regardless of where located in [place] ([place]/ [place]) with typical acute episode in [place] in Feb 2017  This time cough  more dry less severe than previous >  abx and pred > improved but did not resolve and worse when stopped it so restarted pred/abx > dx as pneumonia with variable as dz > referred to pulmonary clinic 12/19/2015 by Dr Kurt Aguilar.   History of Present Illness  12/19/2015 1st [facility] office visit/ Kurt Aguilar   Chief Complaint  Patient presents with  . Pulmonary Consult    Referred by Dr. Alphonsa Aguilar. Pt c/o cough x 4 wks- non prod and worse in the evening and when he Aguilar down. Talking and exertion are things that trigger the cough. He has been on pred x 2 and both times cough resolved and immediately returns once done with med. He also c/o SOB- gets winded just walking from room to room at home.   while on prednisone still some weak and sob but better cough and last dose at least one week and worse Aguilar since off it assoc with sob room to room and weakness/ excess/ purulent sputum or mucus plugs / no unusual exp / no ctd.  Has new CPAP machine fall 2016  rec Please remember to go to the lab   department downstairs for your tests - we will call you with the results when they are available. 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 Follow-up    Cough and SOB have improved some, but not back to his normal baseline. He states still feeling  very weak.   no longer needing cough med, walking down block and back slower than baeline  No obvious day to day or daytime variability or assoc excess/ purulent sputum or mucus plugs or hemoptysis or cp or chest tightness, subjective wheeze or overt sinus or hb symptoms. No unusual exp hx or h/o childhood pna/ asthma or knowledge of premature birth.  Sleeping ok without nocturnal  or early am exacerbation  of respiratory  c/o's or need for noct saba. Also denies any obvious fluctuation of symptoms with weather or environmental changes or other aggravating or alleviating factors except as outlined above   Current Medications, Allergies, Complete Past Medical History, Past Surgical History, Family History, and Social History were reviewed in [employer] record.  ROS  The following are not active complaints unless bolded sore throat, dysphagia, dental problems, itching, sneezing,  nasal congestion or excess/ purulent secretions, ear ache,   fever, chills, sweats, unintended wt loss, classically pleuritic or exertional cp,  orthopnea pnd or leg swelling, presyncope, palpitations, abdominal pain, anorexia, nausea, vomiting, diarrhea  or change in bowel or bladder habits, change in stools or urine, dysuria,hematuria,  rash, arthralgias, visual complaints, headache, numbness, weakness or ataxia or problems with walking or coordination,  change in mood/affect or memory.  Objective:   Physical Exam  Pleasant min hoarse amb wm nad  01/07/2016          228   12/19/15 229 lb 4 oz (103.987 kg)  12/19/15 229 lb 4 oz (103.987 kg)  12/19/15 227 lb (102.967 kg)    Vital signs reviewed  HEENT: nl dentition, turbinates, and oropharynx. Nl external ear canals without cough reflex   NECK :  without JVD/Nodes/TM/ nl carotid upstrokes bilaterally   LUNGS: no acc muscle use,  Nl contour chest which is clear to A and P bilaterally without cough on insp or exp  maneuvers   CV:  RRR  no s3 or murmur or increase in P2, no edema   ABD:  soft and nontender with nl inspiratory excursion in the supine position. No bruits or organomegaly, bowel sounds nl  MS:  Nl gait/ ext warm without deformities, calf tenderness, cyanosis or clubbing No obvious joint restrictions   SKIN: warm and dry without lesions    NEURO:  alert, approp, nl sensorium with  no motor deficits      CXR PA and Lateral:   01/07/2016 :    I personally reviewed images and agree with radiology impression as follows:   No active cardiopulmonary disease.    Lab Results  Component Value Date   ESRSEDRATE 18 01/07/2016   ESRSEDRATE 93* 12/19/2015        Assessment & Plan:

## 2016-01-07 NOTE — Assessment & Plan Note (Signed)
Onset of symptoms feb 2017 with "photo neg pumonary edema pattern" - Allergy profile No visit date found. >  Eos 0.2 /  IgE  89/ Pos Cat RAST only  12/19/2015  ESR 93 > started on prednisone 20 mg daily  HSP profile 12/19/15 >  Neg  - 01/07/2016 ESR down to 18 but only 30% improved symptomatically > rec continue 20 mg daily until better then 10 mg daily   The goal with a chronic steroid dependent illness is always arriving at the lowest effective dose that controls the disease/symptoms and not accepting a set "formula" which is based on statistics or guidelines that don't always take into account patient  variability or the natural hx of the dz in every individual patient, which may well vary over time.  For now therefore I recommend the patient maintain  20 until better then 10 mg daily   Also emphasized rx for gerd/ cyclical coughing   I had an extended discussion with the patient reviewing all relevant studies completed to date and  lasting 15 to 20 minutes of a 25 minute visit    Each maintenance medication was reviewed in detail including most importantly the difference between maintenance and prns and under what circumstances the prns are to be triggered using an action plan format that is not reflected in the computer generated alphabetically organized AVS.    Please see instructions for details which were reviewed in writing and the patient given a copy highlighting the part that I personally wrote and discussed at today's ov.

## 2016-01-07 NOTE — Progress Notes (Signed)
Quick Note:  Spoke with pt and notified of results per Dr. Wert. Pt verbalized understanding and denied any questions.  ______ 

## 2016-01-07 NOTE — Patient Instructions (Signed)
Please remember to go to the lab and x-ray department downstairs for your tests - we will call you with the results when they are available.  Please schedule a follow up office visit in 4 weeks, sooner if needed

## 2016-01-13 DIAGNOSIS — G4733 Obstructive sleep apnea (adult) (pediatric): Secondary | ICD-10-CM | POA: Diagnosis not present

## 2016-01-15 DIAGNOSIS — G4733 Obstructive sleep apnea (adult) (pediatric): Secondary | ICD-10-CM | POA: Diagnosis not present

## 2016-01-17 ENCOUNTER — Telehealth: Payer: Self-pay | Admitting: Internal Medicine

## 2016-01-17 NOTE — Telephone Encounter (Signed)
The lowest dose of prednisone that works is the right dose so needs to go back to that dose and hold there for 2 weeks then ov before attempt to taper again

## 2016-01-17 NOTE — Telephone Encounter (Signed)
Spoke with patient- aware of rec's per MW Scheduled for appt with MW 02/01/16 at 12pm Nothing further needed.

## 2016-01-17 NOTE — Telephone Encounter (Signed)
Pt states he is down to Prednisone 10mg  tabs today for the next 3 days and is starting to have increased cough with chest congestion x 2 days with sore throat.  Pt states that cough had almost resolved with Prednisone taper but has returned and is increasing.  Pt reports some mucus production white in color, runny nose and more SOB.  Please advise Dr [name]. Thanks.   Patient Instructions 01/07/16     Please remember to go to the lab and x-ray department downstairs for your tests - we will call you with the results when they are available.  Please schedule a follow up office visit in 4 weeks, sooner if needed

## 2016-01-27 ENCOUNTER — Telehealth: Payer: Self-pay | Admitting: Family Medicine

## 2016-01-27 DIAGNOSIS — Z0001 Encounter for general adult medical examination with abnormal findings: Secondary | ICD-10-CM | POA: Insufficient documentation

## 2016-01-27 DIAGNOSIS — M1A00X0 Idiopathic chronic gout, unspecified site, without tophus (tophi): Secondary | ICD-10-CM

## 2016-01-27 DIAGNOSIS — Z0000 Encounter for general adult medical examination without abnormal findings: Secondary | ICD-10-CM

## 2016-01-27 DIAGNOSIS — Z125 Encounter for screening for malignant neoplasm of prostate: Secondary | ICD-10-CM

## 2016-01-27 DIAGNOSIS — E119 Type 2 diabetes mellitus without complications: Secondary | ICD-10-CM

## 2016-01-27 DIAGNOSIS — N401 Enlarged prostate with lower urinary tract symptoms: Secondary | ICD-10-CM

## 2016-01-27 DIAGNOSIS — N138 Other obstructive and reflux uropathy: Secondary | ICD-10-CM

## 2016-01-27 NOTE — Telephone Encounter (Signed)
-----   Message from Ellamae Sia sent at 01/25/2016 12:11 PM EDT ----- Regarding: Lab orders for Wednesday, 5.31.17 Patient is scheduled for CPX labs, please order future labs, Thanks , Karna Christmas

## 2016-02-01 ENCOUNTER — Ambulatory Visit (INDEPENDENT_AMBULATORY_CARE_PROVIDER_SITE_OTHER): Payer: PPO | Admitting: Internal Medicine

## 2016-02-01 ENCOUNTER — Encounter: Payer: Self-pay | Admitting: Internal Medicine

## 2016-02-01 VITALS — BP 132/80 | HR 77 | Ht 70.0 in | Wt 235.4 lb

## 2016-02-01 DIAGNOSIS — R918 Other nonspecific abnormal finding of lung field: Secondary | ICD-10-CM

## 2016-02-01 NOTE — Patient Instructions (Addendum)
Prednisone 10 mg with bfast x 2 week then 5 mg x 2 weeks then 5 mg even days x 2 weeks and stop   If breathing/cough worse or nausea resume previous dose  Please schedule a follow up office visit in 6 weeks, call sooner if needed with cxr

## 2016-02-01 NOTE — Progress Notes (Signed)
Subjective:    Patient ID: Kurt Aguilar, male    DOB: 12-Aug-1946,   MRN: [id number]    Brief patient profile:  69 yowm never smoker after cabg in [place] started noting recurrent winter cough regardless of where located in [place] ([place]/ [place]) with typical acute episode in [place] in Feb 2017  This time cough  more dry less severe than previous >  abx and pred > improved but did not resolve and worse when stopped it so restarted pred/abx > dx as pneumonia with variable as dz > referred to pulmonary clinic 12/19/2015 by Dr Kurt Aguilar with presumed BOOP   History of Present Illness  12/19/2015 1st [facility] office visit/ Kurt Aguilar   Chief Complaint  Patient presents with  . Pulmonary Consult    Referred by Dr. Alphonsa Aguilar. Pt c/o cough x 4 wks- non prod and worse in the evening and when he Aguilar down. Talking and exertion are things that trigger the cough. He has been on pred x 2 and both times cough resolved and immediately returns once done with med. He also c/o SOB- gets winded just walking from room to room at home.   while on prednisone still some weak and sob but better cough and last dose at least one week and worse Aguilar since off it assoc with sob room to room and weakness/ excess/ purulent sputum or mucus plugs / no unusual exp / no ctd.  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 Follow-up    Cough and SOB have improved some, but not back to his normal baseline. He states still feeling very weak.   no longer needing cough med, walking down block and back slower than baseline rec No change  rx   02/01/2016  f/u ov/Kurt Aguilar re: boop tapered pred to 10 mg as 01/28/16  Chief Complaint  Patient presents with  . Follow-up    pt states he is much improved since last office visit.  SOB has resolved.     Not limited by breathing from desired activities / concerned about wt gain from pred   No obvious day to day or daytime variability or assoc excess/ purulent sputum or mucus plugs or hemoptysis or cp or chest tightness, subjective wheeze or overt sinus or hb symptoms. No unusual exp hx or h/o childhood pna/ asthma or knowledge of premature birth.  Sleeping ok without nocturnal  or early am exacerbation  of respiratory  c/o's or need for noct saba. Also denies any obvious fluctuation of symptoms with weather or environmental changes or other aggravating or alleviating factors except as outlined above   Current Medications, Allergies, Complete Past Medical History, Past Surgical History, Family History, and Social History were reviewed in [employer] record.  ROS  The following are not active complaints unless bolded sore throat, dysphagia, dental problems, itching, sneezing,  nasal congestion or excess/ purulent secretions, ear ache,   fever, chills, sweats, unintended wt loss, classically pleuritic or exertional cp,  orthopnea pnd or leg swelling, presyncope, palpitations, abdominal pain,  anorexia, nausea, vomiting, diarrhea  or change in bowel or bladder habits, change in stools or urine, dysuria,hematuria,  rash, arthralgias, visual complaints, headache, numbness, weakness or ataxia or problems with walking or coordination,  change in mood/affect or memory.                 Objective:   Physical Exam  Pleasant min hoarse amb wm nad  02/01/2016        235  01/07/2016          228   12/19/15 229 lb 4 oz (103.987 kg)  12/19/15 229 lb 4 oz (103.987 kg)  12/19/15 227 lb (102.967 kg)    Vital signs reviewed  HEENT: nl dentition, turbinates, and oropharynx. Nl  external ear canals without cough reflex   NECK :  without JVD/Nodes/TM/ nl carotid upstrokes bilaterally   LUNGS: no acc muscle use,  Nl contour chest which is clear to A and P bilaterally without cough on insp or exp maneuvers   CV:  RRR  no s3 or murmur or increase in P2, no edema   ABD:  soft and nontender with nl inspiratory excursion in the supine position. No bruits or organomegaly, bowel sounds nl  MS:  Nl gait/ ext warm without deformities, calf tenderness, cyanosis or clubbing No obvious joint restrictions   SKIN: warm and dry without lesions    NEURO:  alert, approp, nl sensorium with  no motor deficits        I personally reviewed images and agree with radiology impression as follows:  CXR:  01/07/16  No active cardiopulmonary disease.   .   Lab Results  Component Value Date   ESRSEDRATE 18 01/07/2016   ESRSEDRATE 93* 12/19/2015         Assessment & Plan:

## 2016-02-03 ENCOUNTER — Encounter: Payer: Self-pay | Admitting: Internal Medicine

## 2016-02-03 NOTE — Assessment & Plan Note (Signed)
Onset of symptoms feb 2017 with "photo neg pumonary edema pattern" - Allergy profile No visit date found. >  Eos 0.2 /  IgE  89/ Pos Cat RAST only  12/19/2015  ESR 93 > started on prednisone 20 mg daily  HSP profile 12/19/15 >  Neg  - 01/07/2016 ESR down to 18 but only 30% improved symptomatically > rec continue 20 mg daily until better then 10 mg daily  - 02/01/2016 rec taper off x 6 weeks then ov   The goal with a chronic steroid dependent illness is always arriving at the lowest effective dose that controls the disease/symptoms and not accepting a set "formula" which is based on statistics or guidelines that don't always take into account patient  variability or the natural hx of the dz in every individual patient, which may well vary over time.  For now therefore I recommend the patient taper off over 6 weeks  I had an extended discussion with the patient reviewing all relevant studies completed to date and  lasting 15 to 20 minutes of a 25 minute visit on the following ongoing concerns:   Cautioned what to look for as taper emphasizing that the lowest dose that works to keep the symptoms from coming back is the right dose and hopefully that will be zero by the time he returns in 6 weeks  Cautioned re adrenal reserve issues and to look out for nausea/ fatigue as get to the lowest doses of prednisone  Reviewed diet to prevent excess wt gain  Each maintenance medication was reviewed in detail including most importantly the difference between maintenance and as needed and under what circumstances the prns are to be used.  Please see instructions for details which were reviewed in writing and the patient given a copy.

## 2016-02-06 ENCOUNTER — Ambulatory Visit (INDEPENDENT_AMBULATORY_CARE_PROVIDER_SITE_OTHER): Payer: PPO

## 2016-02-06 ENCOUNTER — Other Ambulatory Visit (INDEPENDENT_AMBULATORY_CARE_PROVIDER_SITE_OTHER): Payer: PPO

## 2016-02-06 VITALS — BP 140/90 | HR 80 | Temp 97.7°F | Ht 70.5 in | Wt 235.5 lb

## 2016-02-06 DIAGNOSIS — E119 Type 2 diabetes mellitus without complications: Secondary | ICD-10-CM

## 2016-02-06 DIAGNOSIS — N401 Enlarged prostate with lower urinary tract symptoms: Secondary | ICD-10-CM | POA: Diagnosis not present

## 2016-02-06 DIAGNOSIS — M1A00X0 Idiopathic chronic gout, unspecified site, without tophus (tophi): Secondary | ICD-10-CM

## 2016-02-06 DIAGNOSIS — Z1159 Encounter for screening for other viral diseases: Secondary | ICD-10-CM | POA: Diagnosis not present

## 2016-02-06 DIAGNOSIS — Z0000 Encounter for general adult medical examination without abnormal findings: Secondary | ICD-10-CM

## 2016-02-06 DIAGNOSIS — N138 Other obstructive and reflux uropathy: Secondary | ICD-10-CM

## 2016-02-06 LAB — LIPID PANEL
Cholesterol: 159 mg/dL (ref 0–200)
HDL: 55.2 mg/dL (ref >39.00)
LDL CALC: 72 mg/dL (ref 0–99)
NONHDL: 104.11
Total CHOL/HDL Ratio: 3
Triglycerides: 163 mg/dL — ABNORMAL HIGH (ref 0.0–149.0)
VLDL: 32.6 mg/dL (ref 0.0–40.0)

## 2016-02-06 LAB — COMPREHENSIVE METABOLIC PANEL
ALBUMIN: 4.4 g/dL (ref 3.5–5.2)
ALT: 27 U/L (ref 0–53)
AST: 27 U/L (ref 0–37)
Alkaline Phosphatase: 45 U/L (ref 39–117)
BILIRUBIN TOTAL: 1 mg/dL (ref 0.2–1.2)
BUN: 31 mg/dL — AB (ref 6–23)
CALCIUM: 9.6 mg/dL (ref 8.4–10.5)
CHLORIDE: 105 meq/L (ref 96–112)
CO2: 28 mEq/L (ref 19–32)
CREATININE: 1.45 mg/dL (ref 0.40–1.50)
GFR: 50.79 mL/min — ABNORMAL LOW (ref >60.00)
Glucose, Bld: 110 mg/dL — ABNORMAL HIGH (ref 70–99)
Potassium: 4.7 mEq/L (ref 3.5–5.1)
SODIUM: 141 meq/L (ref 135–145)
TOTAL PROTEIN: 6.6 g/dL (ref 6.0–8.3)

## 2016-02-06 LAB — CBC WITH DIFFERENTIAL/PLATELET
BASOS ABS: 0 10*3/uL (ref 0.0–0.1)
Basophils Relative: 0.4 % (ref 0.0–3.0)
EOS ABS: 0.2 10*3/uL (ref 0.0–0.7)
Eosinophils Relative: 1.9 % (ref 0.0–5.0)
HCT: 41.9 % (ref 39.0–52.0)
Hemoglobin: 13.9 g/dL (ref 13.0–17.0)
LYMPHS ABS: 3.3 10*3/uL (ref 0.7–4.0)
Lymphocytes Relative: 35 % (ref 12.0–46.0)
MCHC: 33.2 g/dL (ref 30.0–36.0)
MCV: 89.3 fl (ref 78.0–100.0)
Monocytes Absolute: 0.7 10*3/uL (ref 0.1–1.0)
Monocytes Relative: 7.5 % (ref 3.0–12.0)
NEUTROS ABS: 5.2 10*3/uL (ref 1.4–7.7)
Neutrophils Relative %: 55.2 % (ref 43.0–77.0)
Platelets: 223 10*3/uL (ref 150.0–400.0)
RBC: 4.69 Mil/uL (ref 4.22–5.81)
RDW: 15.5 % (ref 11.5–15.5)
WBC: 9.4 10*3/uL (ref 4.0–10.5)

## 2016-02-06 LAB — MICROALBUMIN / CREATININE URINE RATIO
CREATININE, U: 174.5 mg/dL
MICROALB UR: 2.1 mg/dL — AB (ref 0.0–1.9)
MICROALB/CREAT RATIO: 1.2 mg/g (ref 0.0–30.0)

## 2016-02-06 LAB — URIC ACID: URIC ACID, SERUM: 5.9 mg/dL (ref 4.0–7.8)

## 2016-02-06 LAB — HEMOGLOBIN A1C: HEMOGLOBIN A1C: 7.8 % — AB (ref 4.6–6.5)

## 2016-02-06 LAB — PSA: PSA: 0.87 ng/mL (ref 0.10–4.00)

## 2016-02-06 LAB — TSH: TSH: 2.74 u[IU]/mL (ref 0.35–4.50)

## 2016-02-06 NOTE — Progress Notes (Signed)
Pre visit review using our clinic review tool, if applicable. No additional management support is needed unless otherwise documented below in the visit note. 

## 2016-02-06 NOTE — Progress Notes (Signed)
PCP notes:  Health maintenance:  Tetanus - postponed/insurance Hep C screening - completed Microalbumin - completed A1C - completed Eye exam - Sept 2016 Foot exam - will be completed at CPE  Abnormal screenings: None  Patient concerns: None  Nurse concerns: None  Next appt: 02/12/16 @ 0930

## 2016-02-06 NOTE — Patient Instructions (Signed)
Kurt Aguilar , Thank you for taking time to come for your Medicare Wellness Visit. I appreciate your ongoing commitment to your health goals. Please review the following plan we discussed and let me know if I can assist you in the future.   These are the goals we discussed: Goals    . Weight < 196 lb (88.905 kg)     Starting 02/05/2016, I will continue to exercise for at least 60 min daily.        This is a list of the screening recommended for you and due dates:  Health Maintenance  Topic Date Due  . Complete foot exam   02/12/2016*  . Tetanus Vaccine  02/05/2017*  . Flu Shot  04/08/2016  . Eye exam for diabetics  05/23/2016  . Hemoglobin A1C  08/07/2016  . Colon Cancer Screening  09/08/2016  . Urine Protein Check  02/05/2017  . Shingles Vaccine  Completed  .  Hepatitis C: One time screening is recommended by Center for Disease Control  (CDC) for  adults born from [birth year] through 1967.   Completed  . Pneumonia vaccines  Completed  *Topic was postponed. The date shown is not the original due date.   Preventive Care for Adults  A healthy lifestyle and preventive care can promote health and wellness. Preventive health guidelines for adults include the following key practices.  . A routine yearly physical is a good way to check with your health care provider about your health and preventive screening. It is a chance to share any concerns and updates on your health and to receive a thorough exam.  . Visit your dentist for a routine exam and preventive care every 6 months. Brush your teeth twice a day and floss once a day. Good oral hygiene prevents tooth decay and gum disease.  . The frequency of eye exams is based on your age, health, family medical history, use  of contact lenses, and other factors. Follow your health care provider's ecommendations for frequency of eye exams.  . Eat a healthy diet. Foods like vegetables, fruits, whole grains, low-fat dairy products, and lean protein  foods contain the nutrients you need without too many calories. Decrease your intake of foods high in solid fats, added sugars, and salt. Eat the right amount of calories for you. Get information about a proper diet from your health care provider, if necessary.  . Regular physical exercise is one of the most important things you can do for your health. Most adults should get at least 150 minutes of moderate-intensity exercise (any activity that increases your heart rate and causes you to sweat) each week. In addition, most adults need muscle-strengthening exercises on 2 or more days a week.  Silver Sneakers may be a benefit available to you. To determine eligibility, you may visit the website: www.silversneakers.com or contact program at (618) 600-3925 Mon-Fri between 8AM-8PM.   . Maintain a healthy weight. The body mass index (BMI) is a screening tool to identify possible weight problems. It provides an estimate of body fat based on height and weight. Your health care provider can find your BMI and can help you achieve or maintain a healthy weight.   For adults 20 years and older: ? A BMI below 18.5 is considered underweight. ? A BMI of 18.5 to 24.9 is normal. ? A BMI of 25 to 29.9 is considered overweight. ? A BMI of 30 and above is considered obese.   . Maintain normal blood lipids and cholesterol levels  by exercising and minimizing your intake of saturated fat. Eat a balanced diet with plenty of fruit and vegetables. Blood tests for lipids and cholesterol should begin at age 33 and be repeated every 5 years. If your lipid or cholesterol levels are high, you are over 50, or you are at high risk for heart disease, you may need your cholesterol levels checked more frequently. Ongoing high lipid and cholesterol levels should be treated with medicines if diet and exercise are not working.  . If you smoke, find out from your health care provider how to quit. If you do not use tobacco, please do not  start.  . If you choose to drink alcohol, please do not consume more than 2 drinks per day. One drink is considered to be 12 ounces (355 mL) of beer, 5 ounces (148 mL) of wine, or 1.5 ounces (44 mL) of liquor.  . If you are 63-18 years old, ask your health care provider if you should take aspirin to prevent strokes.  . Use sunscreen. Apply sunscreen liberally and repeatedly throughout the day. You should seek shade when your shadow is shorter than you. Protect yourself by wearing long sleeves, pants, a wide-brimmed hat, and sunglasses year round, whenever you are outdoors.  . Once a month, do a whole body skin exam, using a mirror to look at the skin on your back. Tell your health care provider of new moles, moles that have irregular borders, moles that are larger than a pencil eraser, or moles that have changed in shape or color.

## 2016-02-06 NOTE — Progress Notes (Signed)
Subjective:   Kurt Aguilar is a 70 y.o. male who presents for Medicare Annual/Subsequent preventive examination.  Review of Systems:  N/A Cardiac Risk Factors include: advanced age (>44men, >52 women);male gender;dyslipidemia;diabetes mellitus;hypertension;obesity (BMI >30kg/m2);smoking/ tobacco exposure     Objective:    Vitals: BP 140/90 mmHg  Pulse 80  Temp(Src) 97.7 F (36.5 C) (Oral)  Ht 5' 10.5" (1.791 m)  Wt 235 lb 8 oz (106.822 kg)  BMI 33.30 kg/m2  SpO2 95%  Body mass index is 33.3 kg/(m^2).  Tobacco History  Smoking status  . Former Smoker -- 1.00 packs/day for 3 years  . Types: Cigars  . Quit date: 11/25/2004  Smokeless tobacco  . Never Used    Comment: occas. cigar quit 2006     Counseling given: No   Past Medical History  Diagnosis Date  . Diverticulitis   . Gout   . Hyperlipidemia   . Hypertension   . Osteoporosis   . GERD (gastroesophageal reflux disease)   . Atypical migraine   . Hyperglycemia   . Arthritis   . Diabetes ([place])   . Heart attack ([place])   . Heart disease   . Sleep apnea    Past Surgical History  Procedure Laterality Date  . Open heart surgery  09/09/1995-1998    5 bypass  . Tonsillectomy    . Repair knee ligament      right  . Cataract extraction Bilateral   . Coronary artery bypass graft      [place]   Family History  Problem Relation Age of Onset  . Heart disease Mother   . Heart disease Father   . Stroke Father   . Heart attack Son 26  . Kidney disease Neg Hx   . Prostate cancer Neg Hx    History  Sexual Activity  . Sexual Activity: No    Outpatient Encounter Prescriptions as of 02/06/2016  Medication Sig  . albuterol (VENTOLIN HFA) 108 (90 Base) MCG/ACT inhaler Inhale 2 puffs into the lungs every 4 (four) hours as needed for wheezing or shortness of breath (tight lungs ).  Marland Kitchen allopurinol (ZYLOPRIM) 100 MG tablet Take 1 tablet (100 mg total) by mouth 2 (two) times daily.  Marland Kitchen aspirin 81 MG tablet  Reported on 12/19/2015  . benzonatate (TESSALON) 200 MG capsule Take 1 capsule (200 mg total) by mouth 3 (three) times daily as needed for cough.  . colchicine 0.6 MG tablet Take 1 tablet (0.6 mg total) by mouth 2 (two) times daily as needed. With a meal as needed for acute flare of gout  . ezetimibe (ZETIA) 10 MG tablet Take 1 tablet (10 mg total) by mouth daily.  . furosemide (LASIX) 20 MG tablet Take 1 tablet (20 mg total) by mouth daily as needed. Reported on 12/19/2015  . Glucosamine HCl (GLUCOSAMINE PO) Take 1 tablet by mouth daily. Reported on 12/19/2015  . Lutein 20 MG CAPS Take 1 capsule by mouth daily. Reported on 12/19/2015  . omeprazole (PRILOSEC) 20 MG capsule Take 1 capsule (20 mg total) by mouth 2 (two) times daily.  . polycarbophil (FIBERCON) 625 MG tablet Take 625 mg by mouth daily. Reported on 12/19/2015  . potassium chloride SA (K-DUR,KLOR-CON) 20 MEQ tablet Take 1 tablet (20 mEq total) by mouth daily.  . predniSONE (DELTASONE) 10 MG tablet Take  2 each am until better then 1 each am  . PRODIGY LANCETS 26G MISC 1 each by Does not apply route daily. To check glucose  qd and prn for DM2 (250.00)  . PRODIGY NO CODING BLOOD GLUC test strip CHECK  BLOOD  GLUCOSE ONE TIME DAILY  AND AS NEEDED  . promethazine-codeine (PHENERGAN WITH CODEINE) 6.25-10 MG/5ML syrup Take 5 mLs by mouth every 6 (six) hours as needed for cough.  . rosuvastatin (CRESTOR) 40 MG tablet Take 1 tablet (40 mg total) by mouth daily.  . tadalafil (CIALIS) 20 MG tablet Take 1 tablet (20 mg total) by mouth daily as needed for erectile dysfunction.   No facility-administered encounter medications on file as of 02/06/2016.    Activities of Daily Living In your present state of health, do you have any difficulty performing the following activities: 02/06/2016  Hearing? Y  Vision? N  Difficulty concentrating or making decisions? N  Walking or climbing stairs? N  Dressing or bathing? N  Doing errands, shopping? N    Preparing Food and eating ? N  Using the Toilet? N  In the past six months, have you accidently leaked urine? N  Do you have problems with loss of bowel control? N  Managing your Medications? N  Managing your Finances? N  Housekeeping or managing your Housekeeping? N    Patient Care Team: [name], MD as PCP - General Minna Merritts, MD as Consulting Physician (Cardiology) Tanda Rockers, MD as Consulting Physician (Pulmonary Disease) Mariah Milling. Kenton Kingfisher, MD as Referring Physician (Dentistry)   Assessment:    Hearing Screening Comments: Wears bilateral hearing aids Vision Screening Comments: Last eye exam in Sept 2016  Exercise Activities and Dietary recommendations Current Exercise Habits: Home exercise routine, Type of exercise: walking;Other - see comments (water aerobics), Frequency (Times/Week): 7, Intensity: Moderate, Exercise limited by: None identified  Goals    . Weight < 196 lb (88.905 kg)     Starting 02/05/2016, I will continue to exercise for at least 60 min daily.       Fall Risk Fall Risk  02/06/2016 11/15/2015 08/07/2014 07/11/2013 06/09/2013  Falls in the past year? No No No Yes Yes  Number falls in past yr: - - - 1 -   Depression Screen PHQ 2/9 Scores 02/06/2016 11/15/2015 08/07/2014 07/11/2013  PHQ - 2 Score 0 0 0 0    Cognitive Testing MMSE - Mini Mental State Exam 02/06/2016  Orientation to time 5  Orientation to Place 5  Registration 3  Attention/ Calculation 0  Recall 3  Language- name 2 objects 0  Language- repeat 1  Language- follow 3 step command 3  Language- read & follow direction 0  Write a sentence 0  Copy design 0  Total score 20   PLEASE NOTE: A Mini-Cog screen was completed. Maximum score is 20. A value of 0 denotes this part of [place]stein MMSE was not completed or the patient failed this part of the Mini-Cog screening.   Mini-Cog Screening Orientation to Time - Max 5 pts Orientation to Place - Max 5 pts Registration - Max 3 pts Recall  - Max 3 pts Language Repeat - Max 1 pts Language Follow 3 Step Command - Max 3 pts  Immunization History  Administered Date(s) Administered  . Influenza Split 07/09/2011  . Influenza Whole 05/14/2010, 06/09/2012  . Influenza, High Dose Seasonal PF 05/22/2014, 07/04/2015  . Influenza,inj,Quad PF,36+ Mos 07/11/2013  . Pneumococcal Conjugate-13 08/07/2014  . Pneumococcal Polysaccharide-23 09/08/2002, 07/11/2013  . Td 09/08/2004  . Zoster 07/09/2011   Screening Tests Health Maintenance  Topic Date Due  . FOOT EXAM  02/12/2016 (Originally  02/04/2015)  . TETANUS/TDAP  02/05/2017 (Originally 09/08/2014)  . INFLUENZA VACCINE  04/08/2016  . OPHTHALMOLOGY EXAM  05/23/2016  . HEMOGLOBIN A1C  08/07/2016  . COLONOSCOPY  09/08/2016  . URINE MICROALBUMIN  02/05/2017  . ZOSTAVAX  Completed  . Hepatitis C Screening  Completed  . PNA vac Low Risk Adult  Completed      Plan:     I have personally reviewed and addressed the Medicare Annual Wellness questionnaire and have noted the following in the patient's chart:  A. Medical and social history B. Use of alcohol, tobacco or illicit drugs  C. Current medications and supplements D. Functional ability and status E.  Nutritional status F.  Physical activity G. Advance directives H. List of other physicians I.  Hospitalizations, surgeries, and ER visits in previous 12 months J.  [place] to include hearing, vision, cognitive, depression L. Referrals and appointments - none  In addition, I have reviewed and discussed with patient certain preventive protocols, quality metrics, and best practice recommendations. A written personalized care plan for preventive services as well as general preventive health recommendations were provided to patient.  See attached scanned questionnaire for additional information.   Signed,   Lindell Noe, MHA, BS, LPN Health Advisor

## 2016-02-07 ENCOUNTER — Ambulatory Visit: Payer: PPO | Admitting: Internal Medicine

## 2016-02-07 LAB — HEPATITIS C ANTIBODY: HCV Ab: NEGATIVE

## 2016-02-12 ENCOUNTER — Ambulatory Visit (INDEPENDENT_AMBULATORY_CARE_PROVIDER_SITE_OTHER): Payer: PPO | Admitting: Family Medicine

## 2016-02-12 ENCOUNTER — Encounter: Payer: Self-pay | Admitting: Family Medicine

## 2016-02-12 VITALS — BP 130/72 | HR 81 | Temp 98.1°F | Ht 70.5 in | Wt 235.5 lb

## 2016-02-12 DIAGNOSIS — I10 Essential (primary) hypertension: Secondary | ICD-10-CM | POA: Diagnosis not present

## 2016-02-12 DIAGNOSIS — Z125 Encounter for screening for malignant neoplasm of prostate: Secondary | ICD-10-CM

## 2016-02-12 DIAGNOSIS — Z23 Encounter for immunization: Secondary | ICD-10-CM | POA: Diagnosis not present

## 2016-02-12 DIAGNOSIS — N289 Disorder of kidney and ureter, unspecified: Secondary | ICD-10-CM

## 2016-02-12 DIAGNOSIS — J181 Lobar pneumonia, unspecified organism: Secondary | ICD-10-CM

## 2016-02-12 DIAGNOSIS — E119 Type 2 diabetes mellitus without complications: Secondary | ICD-10-CM

## 2016-02-12 DIAGNOSIS — Z0000 Encounter for general adult medical examination without abnormal findings: Secondary | ICD-10-CM | POA: Diagnosis not present

## 2016-02-12 DIAGNOSIS — E785 Hyperlipidemia, unspecified: Secondary | ICD-10-CM

## 2016-02-12 MED ORDER — GLIPIZIDE ER 5 MG PO TB24: 5.0000 mg | ORAL_TABLET | Freq: Every day | ORAL | Status: DC

## 2016-02-12 NOTE — Assessment & Plan Note (Signed)
Overall stable Lab Results  Component Value Date   CREATININE 1.45 02/06/2016   BUN 31* 02/06/2016   NA 141 02/06/2016   K 4.7 02/06/2016   CL 105 02/06/2016   CO2 28 02/06/2016   Enc to drink more water

## 2016-02-12 NOTE — Assessment & Plan Note (Signed)
Disc goals for lipids and reasons to control them Rev labs with pt Rev low sat fat diet in detail Good control with crestor and zetia

## 2016-02-12 NOTE — Assessment & Plan Note (Signed)
Poor control due to prednisone Lab Results  Component Value Date   HGBA1C 7.8* 02/06/2016   This is way up  Enc checking glucose later in the day Given glipizide 5 mg xl for use while on prednisone F/u 29mo

## 2016-02-12 NOTE — Progress Notes (Signed)
Pre visit review using our clinic review tool, if applicable. No additional management support is needed unless otherwise documented below in the visit note. 

## 2016-02-12 NOTE — Assessment & Plan Note (Signed)
Lab Results  Component Value Date   PSA 0.87 02/06/2016   PSA 1.11 09/24/2015   PSA 1.07 07/28/2014   No clinical changes  Has had exam at [facility] office

## 2016-02-12 NOTE — Assessment & Plan Note (Signed)
Clinically improved Still seeing pulmonary for sequelae and still on prednisone (tapering) for another mo

## 2016-02-12 NOTE — Assessment & Plan Note (Signed)
Reviewed health habits including diet and exercise and skin cancer prevention Reviewed appropriate screening tests for age  Also reviewed health mt list, fam hx and immunization status , as well as social and family history   See HPI Rev AMW with Lesia  Try the glipizide daily during prednisone course to control blood sugar  If hypoglycemia - hold it and let me know  Start checking some readings later in the day When ready- get back on track with diabetic diet and exercise  Also increase water intake  Had a flex sig at the [place] in the fall for screening  PSA is low  Get your tetanus shot today at the health dept

## 2016-02-12 NOTE — Patient Instructions (Signed)
Try the glipizide daily during prednisone course to control blood sugar  If hypoglycemia - hold it and let me know  Start checking some readings later in the day When ready- get back on track with diabetic diet and exercise  Also increase water intake  Get your tetanus shot today at the health dept

## 2016-02-12 NOTE — Assessment & Plan Note (Signed)
bp in fair control at this time  BP Readings from Last 1 Encounters:  02/12/16 130/72   No changes needed Disc lifstyle change with low sodium diet and exercise  Labs reviewed  Not on med currently (due to low bp for a while when sick)-continue to monitor

## 2016-02-12 NOTE — Progress Notes (Addendum)
Subjective:    Patient ID: Kurt Aguilar, male    DOB: 05/11/1946, 70 y.o.   MRN: [id number]  HPI Here for health maintenance exam and to review chronic medical problems    Had AMW with Lesia on 5/31 and did well  Doing well overall    Wt is stable with bmi of 33 Still on prednisone   Colonoscopy 1/08= he was scheduled and then they did a flex sig (no barium enema) Everything was ok - this was in Sept 2016   Prostate screen  Lab Results  Component Value Date   PSA 0.87 02/06/2016   PSA 1.11 09/24/2015   PSA 1.07 07/28/2014  no prostate problems  Gets up at night to urinate 1-2 times -is tolerating that  Thinks he is emptying his bladder all the way  Sees urology    bp is stable today (he is monitoring at home)-no problems  No cp or palpitations or headaches or edema  No side effects to medicines  That is w/o any medicine at all  BP Readings from Last 3 Encounters:  02/12/16 130/72  02/06/16 140/90  02/01/16 132/80     bmi stable at 32 Obese   DM2--130s in am , not checking in pm  Lab Results  Component Value Date   HGBA1C 7.8* 02/06/2016  up from 6.2 Has been on steroids from lung problems (projection is 4 more weeks to go) Has been weaning dose  Was on metformin years ago - too hard on kidneys  Diet - "eating everything in site"  Cannot control appetite with prednisone   microalbumin slt elevated-will watch   Breathing is much better!  Cholesterol Lab Results  Component Value Date   CHOL 159 02/06/2016   CHOL 187 07/28/2014   CHOL 134 07/04/2013   Lab Results  Component Value Date   HDL 55.20 02/06/2016   HDL 44.40 07/28/2014   HDL 38.20* 07/04/2013   Lab Results  Component Value Date   LDLCALC 72 02/06/2016   LDLCALC 106* 07/28/2014   LDLCALC 53 11/24/2012   Lab Results  Component Value Date   TRIG 163.0* 02/06/2016   TRIG 182.0* 07/28/2014   TRIG 261.0* 07/04/2013   Lab Results  Component Value Date   CHOLHDL 3 02/06/2016   CHOLHDL 4 07/28/2014   CHOLHDL 4 07/04/2013   Lab Results  Component Value Date   LDLDIRECT 65.4 07/04/2013   LDLDIRECT 57.1 08/18/2012   LDLDIRECT 56.5 11/25/2010   looking very good/ eating is fair  On crestor     Chemistry      Component Value Date/Time   NA 141 02/06/2016 1002   K 4.7 02/06/2016 1002   CL 105 02/06/2016 1002   CO2 28 02/06/2016 1002   BUN 31* 02/06/2016 1002   CREATININE 1.45 02/06/2016 1002      Component Value Date/Time   CALCIUM 9.6 02/06/2016 1002   ALKPHOS 45 02/06/2016 1002   AST 27 02/06/2016 1002   ALT 27 02/06/2016 1002   BILITOT 1.0 02/06/2016 1002     does not always drink enough water   Lab Results  Component Value Date   WBC 9.4 02/06/2016   HGB 13.9 02/06/2016   HCT 41.9 02/06/2016   MCV 89.3 02/06/2016   PLT 223.0 02/06/2016   Lab Results  Component Value Date   TSH 2.74 02/06/2016    Has planned a tetanus shot this after noon - at the health dept   Patient Active Problem List  Diagnosis Date Noted  . Routine general medical examination at a health care facility 01/27/2016  . Pulmonary infiltrates 12/19/2015  . Fatigue 12/12/2015  . Hypotension 12/12/2015  . Lobar pneumonia ([place]) 12/03/2015  . Cough 11/29/2015  . Cystic kidney disease 10/01/2015  . Renal lesion 09/27/2015  . Erectile dysfunction of organic origin 09/27/2015  . BPH with obstruction/lower urinary tract symptoms 09/27/2015  . Constipation 09/24/2015  . Blood in semen 09/24/2015  . Cyst of right kidney 09/24/2015  . Leukocytosis 09/24/2015  . CAD (coronary artery disease) 06/08/2015  . Grieving 11/22/2014  . Left knee injury 06/19/2014  . Encounter for Medicare annual wellness exam 07/11/2013  . Prostate cancer screening 07/03/2013  . Right bundle branch block 02/22/2013  . Chronic low back pain 05/13/2011  . Obstructive sleep apnea 03/15/2010  . Hyperlipidemia 04/12/2009  . Gout 04/12/2009  . Essential hypertension 04/12/2009  . CAD (coronary  artery disease) of artery bypass graft 04/12/2009  . GERD 04/12/2009  . Renal insufficiency 04/12/2009  . Diabetes type 2, controlled ([place]) 04/12/2009  . DIVERTICULITIS, HX OF 04/12/2009   Past Medical History  Diagnosis Date  . Diverticulitis   . Gout   . Hyperlipidemia   . Hypertension   . Osteoporosis   . GERD (gastroesophageal reflux disease)   . Atypical migraine   . Hyperglycemia   . Arthritis   . Diabetes ([place])   . Heart attack ([place])   . Heart disease   . Sleep apnea    Past Surgical History  Procedure Laterality Date  . Open heart surgery  09/09/1995-1998    5 bypass  . Tonsillectomy    . Repair knee ligament      right  . Cataract extraction Bilateral   . Coronary artery bypass graft      [place]   Social History  Substance Use Topics  . Smoking status: Former Smoker -- 1.00 packs/day for 3 years    Types: Cigars    Quit date: 11/25/2004  . Smokeless tobacco: Never Used     Comment: occas. cigar quit 2006  . Alcohol Use: 0.6 oz/week    1 Standard drinks or equivalent per week     Comment: rare   Family History  Problem Relation Age of Onset  . Heart disease Mother   . Heart disease Father   . Stroke Father   . Heart attack Son 62  . Kidney disease Neg Hx   . Prostate cancer Neg Hx    Allergies  Allergen Reactions  . Oxycodone-Acetaminophen     Stops breathing   Current Outpatient Prescriptions on File Prior to Visit  Medication Sig Dispense Refill  . albuterol (VENTOLIN HFA) 108 (90 Base) MCG/ACT inhaler Inhale 2 puffs into the lungs every 4 (four) hours as needed for wheezing or shortness of breath (tight lungs ). 1 Inhaler prn  . allopurinol (ZYLOPRIM) 100 MG tablet Take 1 tablet (100 mg total) by mouth 2 (two) times daily. 180 tablet 3  . aspirin 81 MG tablet Reported on 12/19/2015    . benzonatate (TESSALON) 200 MG capsule Take 1 capsule (200 mg total) by mouth 3 (three) times daily as needed for cough. 60 capsule 0  . colchicine  0.6 MG tablet Take 1 tablet (0.6 mg total) by mouth 2 (two) times daily as needed. With a meal as needed for acute flare of gout 45 tablet 1  . ezetimibe (ZETIA) 10 MG tablet Take 1 tablet (10 mg total) by mouth daily.  90 tablet 3  . furosemide (LASIX) 20 MG tablet Take 1 tablet (20 mg total) by mouth daily as needed. Reported on 12/19/2015 30 tablet 3  . Glucosamine HCl (GLUCOSAMINE PO) Take 1 tablet by mouth daily. Reported on 12/19/2015    . Lutein 20 MG CAPS Take 1 capsule by mouth daily. Reported on 12/19/2015    . omeprazole (PRILOSEC) 20 MG capsule Take 1 capsule (20 mg total) by mouth 2 (two) times daily. 180 capsule 3  . polycarbophil (FIBERCON) 625 MG tablet Take 625 mg by mouth daily. Reported on 12/19/2015    . potassium chloride SA (K-DUR,KLOR-CON) 20 MEQ tablet Take 1 tablet (20 mEq total) by mouth daily. 90 tablet 3  . predniSONE (DELTASONE) 10 MG tablet Take  2 each am until better then 1 each am 60 tablet 2  . PRODIGY LANCETS 26G MISC 1 each by Does not apply route daily. To check glucose qd and prn for DM2 (250.00) 100 each 3  . PRODIGY NO CODING BLOOD GLUC test strip CHECK  BLOOD  GLUCOSE ONE TIME DAILY  AND AS NEEDED 200 each 3  . promethazine-codeine (PHENERGAN WITH CODEINE) 6.25-10 MG/5ML syrup Take 5 mLs by mouth every 6 (six) hours as needed for cough. 180 mL 0  . rosuvastatin (CRESTOR) 40 MG tablet Take 1 tablet (40 mg total) by mouth daily. 90 tablet 3  . tadalafil (CIALIS) 20 MG tablet Take 1 tablet (20 mg total) by mouth daily as needed for erectile dysfunction. 10 tablet 12   No current facility-administered medications on file prior to visit.    Review of Systems Review of Systems  Constitutional: Negative for fever, appetite change, fatigue and unexpected weight change.  Eyes: Negative for pain and visual disturbance.  Respiratory: Negative for cough and shortness of breath.   Cardiovascular: Negative for cp or palpitations    Gastrointestinal: Negative for nausea,  diarrhea and constipation.  Genitourinary: Negative for urgency and frequency.  Skin: Negative for pallor or rash   Neurological: Negative for weakness, light-headedness, numbness and headaches.  Hematological: Negative for adenopathy. Does not bruise/bleed easily.  Psychiatric/Behavioral: Negative for dysphoric mood. The patient is not nervous/anxious.         Objective:   Physical Exam  Constitutional: He appears well-developed and well-nourished. No distress.  obese and well appearing   HENT:  Head: Normocephalic and atraumatic.  Right Ear: External ear normal.  Left Ear: External ear normal.  Nose: Nose normal.  Mouth/Throat: Oropharynx is clear and moist.  Eyes: Conjunctivae and EOM are normal. Pupils are equal, round, and reactive to light. Right eye exhibits no discharge. Left eye exhibits no discharge. No scleral icterus.  Neck: Normal range of motion. Neck supple. No JVD present. Carotid bruit is not present. No thyromegaly present.  Cardiovascular: Normal rate, regular rhythm and intact distal pulses.  Exam reveals no gallop.   Murmur heard. Pulmonary/Chest: Effort normal and breath sounds normal. No respiratory distress. He has no wheezes. He has no rales. He exhibits no tenderness.  Abdominal: Soft. Bowel sounds are normal. He exhibits no distension, no abdominal bruit and no mass. There is no tenderness.  Musculoskeletal: He exhibits no edema or tenderness.  Lymphadenopathy:    He has no cervical adenopathy.  Neurological: He is alert. He has normal reflexes. No cranial nerve deficit. He exhibits normal muscle tone. Coordination normal.  Skin: Skin is warm and dry. No rash noted. No erythema. No pallor.  Solar lentigines diff  Psychiatric: He has  a normal mood and affect.  cheerful          Assessment & Plan:   Problem List Items Addressed This Visit      Cardiovascular and Mediastinum   Essential hypertension - Primary    bp in fair control at this time  BP  Readings from Last 1 Encounters:  02/12/16 130/72   No changes needed Disc lifstyle change with low sodium diet and exercise  Labs reviewed  Not on med currently (due to low bp for a while when sick)-continue to monitor          Respiratory   Lobar pneumonia ([place])    Clinically improved Still seeing pulmonary for sequelae and still on prednisone (tapering) for another mo        Endocrine   Diabetes type 2, controlled ([place])    Poor control due to prednisone Lab Results  Component Value Date   HGBA1C 7.8* 02/06/2016   This is way up  Enc checking glucose later in the day Given glipizide 5 mg xl for use while on prednisone F/u 7mo       Relevant Medications   glipiZIDE (GLUCOTROL XL) 5 MG 24 hr tablet     Genitourinary   Renal insufficiency    Overall stable Lab Results  Component Value Date   CREATININE 1.45 02/06/2016   BUN 31* 02/06/2016   NA 141 02/06/2016   K 4.7 02/06/2016   CL 105 02/06/2016   CO2 28 02/06/2016   Enc to drink more water         Other   Routine general medical examination at a health care facility    Reviewed health habits including diet and exercise and skin cancer prevention Reviewed appropriate screening tests for age  Also reviewed health mt list, fam hx and immunization status , as well as social and family history   See HPI Rev AMW with Lesia  Try the glipizide daily during prednisone course to control blood sugar  If hypoglycemia - hold it and let me know  Start checking some readings later in the day When ready- get back on track with diabetic diet and exercise  Also increase water intake  Had a flex sig at the [place] in the fall for screening  PSA is low  Get your tetanus shot today at the health dept       Prostate cancer screening    Lab Results  Component Value Date   PSA 0.87 02/06/2016   PSA 1.11 09/24/2015   PSA 1.07 07/28/2014   No clinical changes  Has had exam at [place] office      Hyperlipidemia    Disc  goals for lipids and reasons to control them Rev labs with pt Rev low sat fat diet in detail Good control with crestor and zetia

## 2016-02-12 NOTE — Progress Notes (Signed)
I reviewed health advisor's note, was available for consultation, and agree with documentation and plan.  

## 2016-02-13 DIAGNOSIS — G4733 Obstructive sleep apnea (adult) (pediatric): Secondary | ICD-10-CM | POA: Diagnosis not present

## 2016-02-13 NOTE — Addendum Note (Signed)
Addended by: Loura Pardon A on: 02/13/2016 01:44 PM   Modules accepted: Miquel Dunn

## 2016-02-25 DIAGNOSIS — L814 Other melanin hyperpigmentation: Secondary | ICD-10-CM | POA: Diagnosis not present

## 2016-02-25 DIAGNOSIS — L821 Other seborrheic keratosis: Secondary | ICD-10-CM | POA: Diagnosis not present

## 2016-02-25 DIAGNOSIS — L570 Actinic keratosis: Secondary | ICD-10-CM | POA: Diagnosis not present

## 2016-02-25 DIAGNOSIS — D235 Other benign neoplasm of skin of trunk: Secondary | ICD-10-CM | POA: Diagnosis not present

## 2016-02-27 NOTE — Progress Notes (Signed)
This encounter was created in error - please disregard.

## 2016-03-14 DIAGNOSIS — G4733 Obstructive sleep apnea (adult) (pediatric): Secondary | ICD-10-CM | POA: Diagnosis not present

## 2016-03-18 ENCOUNTER — Ambulatory Visit: Payer: PPO | Admitting: Internal Medicine

## 2016-04-14 DIAGNOSIS — G4733 Obstructive sleep apnea (adult) (pediatric): Secondary | ICD-10-CM | POA: Diagnosis not present

## 2016-05-15 DIAGNOSIS — G4733 Obstructive sleep apnea (adult) (pediatric): Secondary | ICD-10-CM | POA: Diagnosis not present

## 2016-05-16 ENCOUNTER — Encounter: Payer: Self-pay | Admitting: Internal Medicine

## 2016-05-16 ENCOUNTER — Ambulatory Visit (INDEPENDENT_AMBULATORY_CARE_PROVIDER_SITE_OTHER): Payer: PPO | Admitting: Internal Medicine

## 2016-05-16 ENCOUNTER — Other Ambulatory Visit (INDEPENDENT_AMBULATORY_CARE_PROVIDER_SITE_OTHER): Payer: PPO

## 2016-05-16 ENCOUNTER — Ambulatory Visit
Admission: RE | Admit: 2016-05-16 | Discharge: 2016-05-16 | Disposition: A | Discharge status: Home / Self Care | Payer: PPO | Source: Ambulatory Visit | Attending: Internal Medicine | Admitting: Internal Medicine

## 2016-05-16 VITALS — BP 124/66 | HR 76 | Ht 70.0 in | Wt 238.4 lb

## 2016-05-16 VITALS — BP 116/76 | HR 99 | Ht 70.5 in | Wt 238.0 lb

## 2016-05-16 DIAGNOSIS — E1121 Type 2 diabetes mellitus with diabetic nephropathy: Secondary | ICD-10-CM | POA: Diagnosis not present

## 2016-05-16 DIAGNOSIS — Z23 Encounter for immunization: Secondary | ICD-10-CM | POA: Diagnosis not present

## 2016-05-16 DIAGNOSIS — R918 Other nonspecific abnormal finding of lung field: Secondary | ICD-10-CM

## 2016-05-16 DIAGNOSIS — G4733 Obstructive sleep apnea (adult) (pediatric): Secondary | ICD-10-CM | POA: Diagnosis not present

## 2016-05-16 LAB — SEDIMENTATION RATE: Sed Rate: 58 mm/hr — ABNORMAL HIGH (ref 0–20)

## 2016-05-16 NOTE — Progress Notes (Signed)
M former smoker followed for OSA Wife here   02/01/15- 67 yoM former smoker followed for OSA, bronchitis, complicated by DM          Wife here Report: Wears CPAP 13/ [employer] nightly 6hrs, snoring through the mask The way the exhaust air vents from his mask bothers his wife. He was off CPAP for 3 weeks on a trip to [place] and admits he sleeps better with it.  05/16/2016-70 year old male former smoker followed for OSA, bronchitis, complicated by DM, CAD/CABG CPAP 17/[employer] FOLLOWS FOR; DME Hue[facility] Romans; Pt states he wears CPAP nightly and DL attached. Pt will need order for new supplies.  Followed recently by Dr. [name] for cough and suspected BOOP He recently got "So Clear" CPAP cleaning device for $300 out of concern that he might get bronchitis from his CPAP machine. Download-adequate compliance 77% 4 hours, good control AHI 4.0/hour. Incidentally uncomfortable today with pain and swelling right elbow he attributes to gout.  ROS-see HPI Constitutional:   No-   weight loss, night sweats, fevers, chills, fatigue, lassitude. HEENT:   No-  headaches, difficulty swallowing, tooth/dental problems, sore throat,       No-  sneezing, itching, ear ache, nasal congestion, post nasal drip,  CV:  No-   chest pain, orthopnea, PND, swelling in lower extremities, anasarca, dizziness, palpitations Resp: No-   shortness of breath with exertion or at rest.              + productive cough,  No non-productive cough,  No- coughing up of blood.              No-   change in color of mucus.  No- wheezing.   Skin: No-   rash or lesions. GI:  No-   heartburn, indigestion, abdominal pain, nausea, vomiting,  GU:  MS:  + joint pain or swelling. . Neuro-     nothing unusual Psych:  No- change in mood or affect. No depression or anxiety.  No memory loss.  OBJ General- Alert, Oriented, Affect-appropriate, Distress- none acute Skin- rash-none, lesions- none, excoriation- none Lymphadenopathy- none Head- atraumatic  Eyes- Gross vision intact, PERRLA, conjunctivae clear secretions            Ears- Hearing aid+            Nose- Clear, no-Septal dev, mucus, polyps, erosion, perforation             Throat- Mallampati II , mucosa clear , drainage- none, tonsils- atrophic Neck- flexible , trachea midline, no stridor , thyroid nl, carotid no bruit Chest - symmetrical excursion , unlabored           Heart/CV- RRR , no murmur , no gallop  , no rub, nl s1 s2                           - JVD- none , edema- none, stasis changes- none, varices- none           Lung- clear to P&A, wheeze- none, cough- none , dullness-none, rub- none           Chest wall-  Abd- Br/ Gen/ Rectal- Not done, not indicated Extrem- cyanosis- none, clubbing, none, atrophy- none, strength- nl. + Swelling right elbow. Neuro- grossly intact to observation

## 2016-05-16 NOTE — Patient Instructions (Signed)
Please remember to go to the lab   department downstairs for your tests - we will call you with the results when they are available.  Weight control is simply a matter of calorie balance which needs to be tilted in your favor by eating less and exercising more.  To get the most out of exercise, you need to be continuously aware that you are short of breath, but never out of breath, for 30 minutes daily. As you improve, it will actually be easier for you to do the same amount of exercise  in  30 minutes so always push to the level where you are short of breath.  If this does not result in gradual weight reduction then I strongly recommend you see a nutritionist with a food diary x 2 weeks so that we can work out a negative calorie balance which is universally effective in steady weight loss programs.  Think of your calorie balance like you do your bank account where in this case you want the balance to go down so you must take in less calories than you burn up.  It's just that simple:  Hard to do, but easy to understand.  Good luck!    If you are satisfied with your treatment plan,  let your doctor know and he/she can either refill your medications or you can return here when your prescription runs out.     If in any way you are not 100% satisfied,  please tell us.  If 100% better, tell your friends!  Pulmonary follow up is as needed

## 2016-05-16 NOTE — Assessment & Plan Note (Signed)
Download and compliance goals reviewed with him. Comfort issues discussed to improve compliance. Quality of life is definitely improved with CPAP and he wishes to continue using it. Plan-hope to increase use. Pressure is comfortable and sufficient. Order replacement supplies as needed.

## 2016-05-16 NOTE — Progress Notes (Signed)
Subjective:    Patient ID: Kurt Aguilar, male    DOB: Nov 18, 1945,   MRN: [id number]    Brief patient profile:  21 yowm never smoker after cabg in [place] started noting recurrent winter cough regardless of where located in [place] ([place]/ [place]) with typical acute episode in [place] in Feb 2017  This time cough  more dry less severe than previous >  abx and pred > improved but did not resolve and worse when stopped it so restarted pred/abx > dx as pneumonia with variable as dz > referred to pulmonary clinic 12/19/2015 by [name] with presumed BOOP   History of Present Illness  12/19/2015 1st [name]/ Fernando Stoiber   Chief Complaint  Patient presents with  . Pulmonary Consult    Referred by [name]. Pt c/o cough x 4 wks- non prod and worse in the evening and when he lies down. Talking and exertion are things that trigger the cough. He has been on pred x 2 and both times cough resolved and immediately returns once done with med. He also c/o SOB- gets winded just walking from room to room at home.   while on prednisone still some weak and sob but better cough and last dose at least one week and worse overall since off it assoc with sob room to room and weakness/ excess/ purulent sputum or mucus plugs / no unusual exp / no ctd.  Has new CPAP machine fall 2016  rec  Omeprazole 20 mg Take 30- 60 min before your first and last meals of the day  Prednisone 10  X 2 each day until 100% better then 10 mg daily  Dx is Brochilitis obliterans with organizing pneumonia vs eosiniphic pneumonia most likely diagnosis GERD  Diet    01/07/2016  f/u ov/Tynesia Harral re: prob boop/ on 20 mg pred at 30% improved Chief Complaint  Patient presents with  . Follow-up    Cough and SOB have improved some, but not back to his normal baseline. He states still feeling very weak.   no longer needing cough med, walking down block and back slower than baseline rec No change  rx   02/01/2016  f/u ov/Oniya Mandarino re: boop tapered pred to 10 mg as 01/28/16  Chief Complaint  Patient presents with  . Follow-up    pt states he is much improved since last office visit.  SOB has resolved.     Not limited by breathing from desired activities / concerned about wt gain from pred  rec Prednisone 10 mg with bfast x 2 week then 5 mg x 2 weeks then 5 mg even days x 2 weeks and stop  If breathing/cough worse or nausea resume previous dose   05/16/2016  f/u ov/Audryna Wendt re: boop off pred since early July 2017  Chief Complaint  Patient presents with  . Follow-up    CXR repeated today. Breathing is doing well. No new co's today.    Not limited by breathing from desired activities    No obvious day to day or daytime variability or assoc excess/ purulent sputum or mucus plugs or hemoptysis or cp or chest tightness, subjective wheeze or overt sinus or hb symptoms. No unusual exp hx or h/o childhood pna/ asthma or knowledge of premature birth.  Sleeping ok without nocturnal  or early am exacerbation  of respiratory  c/o's or need for noct saba. Also denies any obvious fluctuation of symptoms with weather or environmental changes or  other aggravating or alleviating factors except as outlined above   Current Medications, Allergies, Complete Past Medical History, Past Surgical History, Family History, and Social History were reviewed in [employer] record.  ROS  The following are not active complaints unless bolded sore throat, dysphagia, dental problems, itching, sneezing,  nasal congestion or excess/ purulent secretions, ear ache,   fever, chills, sweats, unintended wt loss, classically pleuritic or exertional cp,  orthopnea pnd or leg swelling, presyncope, palpitations, abdominal pain, anorexia, nausea, vomiting, diarrhea  or change in bowel or bladder habits, change in stools or urine, dysuria,hematuria,  rash, arthralgias, visual complaints, headache, numbness, weakness or  ataxia or problems with walking or coordination,  change in mood/affect or memory.                 Objective:   Physical Exam  Pleasant min hoarse amb wm nad   05/16/2016          238  02/01/2016        235  01/07/2016          228   12/19/15 229 lb 4 oz (103.987 kg)  12/19/15 229 lb 4 oz (103.987 kg)  12/19/15 227 lb (102.967 kg)    Vital signs reviewed/  Note sats 97% RA on arrival  HEENT: nl dentition, turbinates, and oropharynx. Nl external ear canals without cough reflex   NECK :  without JVD/Nodes/TM/ nl carotid upstrokes bilaterally   LUNGS: no acc muscle use,  Nl contour chest which is clear to A and P bilaterally without cough on insp or exp maneuvers   CV:  RRR  no s3 or murmur or increase in P2, no edema   ABD:  soft and nontender with nl inspiratory excursion in the supine position. No bruits or organomegaly, bowel sounds nl  MS:  Nl gait/ ext warm without deformities, calf tenderness, cyanosis or clubbing No obvious joint restrictions   SKIN: warm and dry without lesions    NEURO:  alert, approp, nl sensorium with  no motor deficits     CXR PA and Lateral:   05/16/2016 :    I personally reviewed images and agree with radiology impression as follows:   No active cardiopulmonary disease        Lab Results  Component Value Date   ESRSEDRATE 58 (H) 05/16/2016   ESRSEDRATE 18 01/07/2016   ESRSEDRATE 93 (H) 12/19/2015       Assessment & Plan:

## 2016-05-16 NOTE — Assessment & Plan Note (Signed)
We discussed interaction of untreated obstructive sleep apnea with more difficult diabetic control. He is motivated to be compliant with CPAP.

## 2016-05-16 NOTE — Patient Instructions (Signed)
Order- DME [facility]- please replace CPAP supplies and mask of choice. Continue 17 cwp, humidifier, AirView   Dx OSA  Please call as needed

## 2016-05-19 DIAGNOSIS — E669 Obesity, unspecified: Secondary | ICD-10-CM | POA: Insufficient documentation

## 2016-05-19 DIAGNOSIS — Z6832 Body mass index (BMI) 32.0-32.9, adult: Secondary | ICD-10-CM | POA: Insufficient documentation

## 2016-05-19 NOTE — Assessment & Plan Note (Signed)
Onset of symptoms feb 2017 with "photo neg pumonary edema pattern" - Allergy profile 12/19/15 >  Eos 0.2 /  IgE  89/ Pos Cat RAST only  12/19/2015  ESR 93 > started on prednisone 20 mg daily  HSP profile 12/19/15 >  Neg  - 01/07/2016 ESR down to 18 but only 30% improved symptomatically > rec continue 20 mg daily until better then 10 mg daily  - 02/01/2016 rec taper off x 6 weeks then ov > 05/16/2016 ESR = 58   I had an extended discussion with the patient reviewing all relevant studies completed to date and  lasting 15 to 20 minutes of a 25 minute visit on the following ongoing concerns:   1) doing great for now though note esr trending back up off prednisone s obvious other source such as connective tissue dz - which could still be occult in this setting and not yet presenting  2) many pts with idiopathic boop will recur up a year and require prednisone rx  3) The goal with a chronic steroid dependent illness is always arriving at the lowest effective dose that controls the disease/symptoms and not accepting a set "formula" which is based on statistics or guidelines that don't always take into account patient  variability or the natural hx of the dz in every individual patient, which may well vary over time.  For now therefore I recommend the patient maintain  Off prednisone unless new symptoms develop then dose on the low side again considering he never required more than 20 mg per day to gain complete remission previously   F/u in 6 weeks planned

## 2016-05-19 NOTE — Progress Notes (Signed)
Called spoke with pt. Reviewed results and recs. Scheduled pt with MW on 06/17/16. He voiced understanding and had no further questions.

## 2016-05-19 NOTE — Assessment & Plan Note (Signed)
Body mass index is 33.67 kg/m.  Lab Results  Component Value Date   TSH 2.74 02/06/2016     Contributing to gerd tendency/ doe/reviewed the need and the process to achieve and maintain neg calorie balance > defer f/u primary care including intermittently monitoring thyroid status

## 2016-05-21 DIAGNOSIS — G4733 Obstructive sleep apnea (adult) (pediatric): Secondary | ICD-10-CM | POA: Diagnosis not present

## 2016-05-23 DIAGNOSIS — I10 Essential (primary) hypertension: Secondary | ICD-10-CM | POA: Diagnosis not present

## 2016-05-23 DIAGNOSIS — Q612 Polycystic kidney, adult type: Secondary | ICD-10-CM | POA: Diagnosis not present

## 2016-05-23 DIAGNOSIS — N183 Chronic kidney disease, stage 3 (moderate): Secondary | ICD-10-CM | POA: Diagnosis not present

## 2016-05-26 ENCOUNTER — Encounter: Payer: Self-pay | Admitting: Internal Medicine

## 2016-06-03 ENCOUNTER — Telehealth: Payer: Self-pay | Admitting: Internal Medicine

## 2016-06-03 MED ORDER — PREDNISONE 10 MG PO TABS: ORAL_TABLET | ORAL | 0 refills | Status: DC

## 2016-06-03 NOTE — Telephone Encounter (Signed)
Spoke with pt and he states that his wife has been sick for about 2 weeks and he thinks he is also sick now. Pt c/o chest congestion, dry cough and some burning sensations. Pt denies ShOB, wheeze, chest tightness or f/n/v. Pt would like to know if he can start back on oral prednisone to help with symptoms.   MW - Please advise. Thanks!  LOV 05/16/16 NOV 06/17/16   Instructions   Patient Instructions    Please remember to go to the lab   department downstairs for your tests - we will call you with the results when they are available.  Weight control is simply a matter of calorie balance which needs to be tilted in your favor by eating less and exercising more.  To get the most out of exercise, you need to be continuously aware that you are short of breath, but never out of breath, for 30 minutes daily. As you improve, it will actually be easier for you to do the same amount of exercise  in  30 minutes so always push to the level where you are short of breath.  If this does not result in gradual weight reduction then I strongly recommend you see a nutritionist with a food diary x 2 weeks so that we can work out a negative calorie balance which is universally effective in steady weight loss programs.  Think of your calorie balance like you do your bank account where in this case you want the balance to go down so you must take in less calories than you burn up.  It's just that simple:  Hard to do, but easy to understand.  Good luck!    If you are satisfied with your treatment plan,  let your doctor know and he/she can either refill your medications or you can return here when your prescription runs out.     If in any way you are not 100% satisfied,  please tell us.  If 100% better, tell your friends!  Pulmonary follow up is as needed

## 2016-06-03 NOTE — Telephone Encounter (Signed)
Prednisone 10 mg 2 daily until better then 1 daily until ov in 2 weeks

## 2016-06-03 NOTE — Telephone Encounter (Signed)
Spoke with pt and gave recommendation. He would like rx sent to pharmacy. Nothing further needed.

## 2016-06-09 LAB — HM DIABETES EYE EXAM

## 2016-06-14 DIAGNOSIS — G4733 Obstructive sleep apnea (adult) (pediatric): Secondary | ICD-10-CM | POA: Diagnosis not present

## 2016-06-16 ENCOUNTER — Ambulatory Visit (INDEPENDENT_AMBULATORY_CARE_PROVIDER_SITE_OTHER): Payer: PPO | Admitting: Cardiovascular Disease

## 2016-06-16 ENCOUNTER — Encounter: Payer: Self-pay | Admitting: Cardiovascular Disease

## 2016-06-16 VITALS — BP 122/70 | HR 73 | Ht 70.5 in | Wt 238.0 lb

## 2016-06-16 DIAGNOSIS — E1121 Type 2 diabetes mellitus with diabetic nephropathy: Secondary | ICD-10-CM | POA: Diagnosis not present

## 2016-06-16 DIAGNOSIS — E7800 Pure hypercholesterolemia, unspecified: Secondary | ICD-10-CM | POA: Diagnosis not present

## 2016-06-16 DIAGNOSIS — I10 Essential (primary) hypertension: Secondary | ICD-10-CM

## 2016-06-16 DIAGNOSIS — I25810 Atherosclerosis of coronary artery bypass graft(s) without angina pectoris: Secondary | ICD-10-CM | POA: Diagnosis not present

## 2016-06-16 NOTE — Patient Instructions (Signed)

## 2016-06-16 NOTE — Progress Notes (Signed)
Cardiology Office Note  Date:  06/16/2016   ID:  Kurt Aguilar, DOB 1946-09-04, MRN [id number]  PCP:  Loura Pardon, MD   Chief Complaint  Patient presents with  . OTHER    6 month f/u no complaints today. Meds reviewed verbally with pt.    HPI:  70 yo with history of CAD,  CABG back in 90 in [place], [place], HTN, hyperlipidemia, presents for routine followup of his coronary artery disease. History of gout. Reports baseline creatinine mildly elevated He lost his son in early 2016 to cardiac arrest History of diabetes  In follow-up today, he reports that he is slowly improving from recent reactive airway symptoms. Started on prednisone for shortness of breath, improvement of symptoms, not at his baseline but is getting close. Currently taking prednisone 10 mg daily, follow-up with Dr. [name] tomorrow. Denies any significant sputum production Previously had Elevated sed rate, also having problems with gout  Off omeprazole, initially had gerd symptoms, Not using inhalers  No regular exercise program for many months Reports he was set back by  "bowel blockage" the beginning of the year Following by upper respiratory infection, possibly viral. He had significant cough, one episode leading to cough syncope In March 2017 was placed on prednisone cough returned after prednisone was discontinued He was started on Advair but this did not seem to help his symptoms In follow-up was placed on prednisone and ABX  Previous CT scan of the chest   showing diffuse coronary calcifications of the LAD, mild calcifications in the aorta Images show interstitial disease bilaterally, worse on the right  Reports sugars have been going higher on the prednisone  EKG on today's visit shows normal sinus rhythm with rate 73 bpm, right bundle branch block, no change from prior EKG  Lab work reviewed, Creatinine 1.45 Total chol 159, LDL 72 Hemoglobin A1c up from 6.2, now 7.8 on prednisone  Other past  medical history  stress test in March of 2011 shows mildly decreased perfusion in the anterior wall at rest with moderately decreased perfusion with stress. He had no significant symptoms with testing.  PMH:   has a past medical history of Arthritis; Atypical migraine; Diabetes ([place]); Diverticulitis; GERD (gastroesophageal reflux disease); Gout; Heart attack; Heart disease; Hyperglycemia; Hyperlipidemia; Hypertension; Osteoporosis; and Sleep apnea.  PSH:    Past Surgical History:  Procedure Laterality Date  . CATARACT EXTRACTION Bilateral   . CORONARY ARTERY BYPASS GRAFT     [place]  . open heart surgery  09/09/1995-1998   5 bypass  . REPAIR KNEE LIGAMENT     right  . TONSILLECTOMY      Current Outpatient Prescriptions  Medication Sig Dispense Refill  . albuterol (VENTOLIN HFA) 108 (90 Base) MCG/ACT inhaler Inhale 2 puffs into the lungs every 4 (four) hours as needed for wheezing or shortness of breath (tight lungs ). 1 Inhaler prn  . allopurinol (ZYLOPRIM) 100 MG tablet Take 1 tablet (100 mg total) by mouth 2 (two) times daily. 180 tablet 3  . aspirin 81 MG tablet Reported on 12/19/2015    . benzonatate (TESSALON) 200 MG capsule Take 1 capsule (200 mg total) by mouth 3 (three) times daily as needed for cough. 60 capsule 0  . colchicine 0.6 MG tablet Take 1 tablet (0.6 mg total) by mouth 2 (two) times daily as needed. With a meal as needed for acute flare of gout 45 tablet 1  . ezetimibe (ZETIA) 10 MG tablet Take 1 tablet (10 mg total)  by mouth daily. 90 tablet 3  . furosemide (LASIX) 20 MG tablet Take 1 tablet (20 mg total) by mouth daily as needed. Reported on 12/19/2015 30 tablet 3  . glipiZIDE (GLUCOTROL XL) 5 MG 24 hr tablet Take 1 tablet (5 mg total) by mouth daily with breakfast. (Patient taking differently: Take 5 mg by mouth daily as needed. ) 30 tablet 3  . Glucosamine HCl (GLUCOSAMINE PO) Take 1 tablet by mouth daily. Reported on 12/19/2015    . Lutein 20 MG CAPS Take 1  capsule by mouth daily. Reported on 12/19/2015    . polycarbophil (FIBERCON) 625 MG tablet Take 625 mg by mouth daily. Reported on 12/19/2015    . potassium chloride SA (K-DUR,KLOR-CON) 20 MEQ tablet Take 1 tablet (20 mEq total) by mouth daily. 90 tablet 3  . predniSONE (DELTASONE) 10 MG tablet TAKE 20mg  DAILY UNTIL BETTER, THEN 10mg  DAILY 28 tablet 0  . PRODIGY LANCETS 26G MISC 1 each by Does not apply route daily. To check glucose qd and prn for DM2 (250.00) 100 each 3  . PRODIGY NO CODING BLOOD GLUC test strip CHECK  BLOOD  GLUCOSE ONE TIME DAILY  AND AS NEEDED 200 each 3  . promethazine-codeine (PHENERGAN WITH CODEINE) 6.25-10 MG/5ML syrup Take 5 mLs by mouth every 6 (six) hours as needed for cough. 180 mL 0  . rosuvastatin (CRESTOR) 40 MG tablet Take 1 tablet (40 mg total) by mouth daily. 90 tablet 3  . tadalafil (CIALIS) 20 MG tablet Take 1 tablet (20 mg total) by mouth daily as needed for erectile dysfunction. 10 tablet 12   No current facility-administered medications for this visit.      Allergies:   Oxycodone-acetaminophen   Social History:  The patient  reports that he quit smoking about 11 years ago. His smoking use included Cigars. He has a 3.00 pack-year smoking history. He has never used smokeless tobacco. He reports that he drinks about 0.6 oz of alcohol per week . He reports that he does not use drugs.   Family History:   family history includes Heart attack (age of onset: 14) in his son; Heart disease in his father and mother; Stroke in his father.    Review of Systems: Review of Systems  Constitutional: Negative.   Respiratory: Positive for cough and shortness of breath.   Cardiovascular: Negative.   Gastrointestinal: Negative.   Musculoskeletal: Negative.   Neurological: Negative.   Psychiatric/Behavioral: Negative.   All other systems reviewed and are negative.    PHYSICAL EXAM: VS:  BP 122/70 (BP Location: Left Arm, Patient Position: Sitting, Cuff Size: Large)    Pulse 73   Ht 5' 10.5" (1.791 m)   Wt 238 lb (108 kg)   BMI 33.67 kg/m  , BMI Body mass index is 33.67 kg/m. GEN: Well nourished, well developed, in no acute distress  HEENT: normal  Neck: no JVD, carotid bruits, or masses Cardiac: RRR; no murmurs, rubs, or gallops,no edema  Respiratory:  clear to auscultation bilaterally, normal work of breathing GI: soft, nontender, nondistended, + BS MS: no deformity or atrophy  Skin: warm and dry, no rash Neuro:  Strength and sensation are intact Psych: euthymic mood, full affect    Recent Labs: 12/18/2015: Pro B Natriuretic peptide (BNP) 18.0 02/06/2016: ALT 27; BUN 31; Creatinine, Ser 1.45; Hemoglobin 13.9; Platelets 223.0; Potassium 4.7; Sodium 141; TSH 2.74    Lipid Panel Lab Results  Component Value Date   CHOL 159 02/06/2016   HDL  55.20 02/06/2016   LDLCALC 72 02/06/2016   TRIG 163.0 (H) 02/06/2016      Wt Readings from Last 3 Encounters:  06/16/16 238 lb (108 kg)  05/16/16 238 lb (108 kg)  05/16/16 238 lb 6.4 oz (108.1 kg)       ASSESSMENT AND PLAN:  Pure hypercholesterolemia - Plan: EKG 12-Lead Cholesterol close to goal, slightly elevated Recommended he work hard to drop his A1c down  Essential hypertension - Plan: EKG 12-Lead Blood pressure is well controlled on today's visit. No changes made to the medications.  Coronary artery disease involving coronary bypass graft of native heart without angina pectoris - Plan: EKG 12-Lead Chronic mild shortness of breath, stable. Recommended if symptoms get worse that he call our office for further evaluation/testing  Controlled type 2 diabetes mellitus with diabetic nephropathy, unspecified long term insulin use status ([place]) - Plan: EKG 12-Lead Stressed importance of weight loss, low carbohydrate diet Prednisone likely contributing to her sugars.   Morbid obesity due to excess calories ([place]) in setting of pred rx  - Plan: EKG 12-Lead We have encouraged continued exercise,  careful diet management in an effort to lose weight.  Reactive airway disease Clinical exam essentially benign today, discussed with him Seems to be very sensitive to upper respiratory tract infections, Wife just getting over one cold, seems to pass back and forth   Total encounter time more than 25 minutes  Greater than 50% was spent in counseling and coordination of care with the patient    Disposition:   F/U  6 months   Orders Placed This Encounter  Procedures  . EKG 12-Lead     Signed, Esmond Plants, M.D., Ph.D. 06/16/2016  [place], [place]

## 2016-06-17 ENCOUNTER — Encounter: Payer: Self-pay | Admitting: Internal Medicine

## 2016-06-17 ENCOUNTER — Ambulatory Visit (INDEPENDENT_AMBULATORY_CARE_PROVIDER_SITE_OTHER): Payer: PPO | Admitting: Internal Medicine

## 2016-06-17 VITALS — BP 112/82 | HR 75 | Ht 70.0 in | Wt 241.8 lb

## 2016-06-17 DIAGNOSIS — R059 Cough, unspecified: Secondary | ICD-10-CM

## 2016-06-17 DIAGNOSIS — R05 Cough: Secondary | ICD-10-CM

## 2016-06-17 DIAGNOSIS — R918 Other nonspecific abnormal finding of lung field: Secondary | ICD-10-CM

## 2016-06-17 MED ORDER — PREDNISONE 10 MG PO TABS: ORAL_TABLET | ORAL | 2 refills | Status: DC

## 2016-06-17 NOTE — Progress Notes (Signed)
Subjective:    Patient ID: Kurt Aguilar, male    DOB: [birth year],   MRN: [id number]    Brief patient profile:  15 yowm never smoker after cabg in [place] started noting recurrent winter cough regardless of where located in [place] ([place]/ [place]) with typical acute episode in [place] in Feb 2017  This time cough  more dry less severe than previous >  abx and pred > improved but did not resolve and worse when stopped it so restarted pred/abx > dx as pneumonia with variable as dz > referred to pulmonary clinic 12/19/2015 by Dr Kurt Aguilar with presumed BOOP   History of Present Illness  12/19/2015 1st [facility] office visit/ Kurt Aguilar   Chief Complaint  Patient presents with  . Pulmonary Consult    Referred by Dr. Alphonsa Aguilar. Pt c/o cough x 4 wks- non prod and worse in the evening and when he Aguilar down. Talking and exertion are things that trigger the cough. He has been on pred x 2 and both times cough resolved and immediately returns once done with med. He also c/o SOB- gets winded just walking from room to room at home.   while on prednisone still some weak and sob but better cough and last dose at least one week and worse Aguilar since off it assoc with sob room to room and weakness/ excess/ purulent sputum or mucus plugs / no unusual exp / no ctd.  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 Follow-up    Cough and SOB have improved some, but not back to his normal baseline. He states still feeling very weak.   no longer needing cough med, walking down block and back slower than baseline rec No change  rx   02/01/2016  f/u ov/Kurt Aguilar re: boop tapered pred to 10 mg as 01/28/16  Chief Complaint  Patient presents with  . Follow-up    pt states he is much improved since last office visit.  SOB has resolved.    Not limited by breathing from desired activities / concerned about wt gain from pred  rec Prednisone 10 mg with bfast x 2 week then 5 mg x 2 weeks then 5 mg even days x 2 weeks and stop  If breathing/cough worse or nausea resume previous dose     05/16/2016  f/u ov/Kurt Aguilar re: boop off pred since early July 2017  Chief Complaint  Patient presents with  . Follow-up    CXR repeated today. Breathing is doing well. No new co's today.   Not limited by breathing from desired activities   rec Please remember to go to the lab   department downstairs for your tests - we will call you with the results when they are available. Weight control iby neg cal balance   06/03/16  PC sick with cough/ sense of chest congestion/ nothing purulent x 3 days and hoarseness  rec pred 20 until better   06/17/2016  f/u ov/Kurt Aguilar re:  prob boop recurrence >> Prednisone down to 10 mg x 3 days  Chief Complaint  Patient presents with  . Follow-up    Had increased cough 2 wks ago- started back on pred 20 mg and stayed on this dose for 9 days, then started on 10 mg daily. He states cough had improved on 20 mg and started worsening again on 10 mg. His cough is non prod.    cough is daytime and dry Aguilar much better   Not limited by breathing from desired activities    No obvious day to day or daytime variability or assoc excess/ purulent sputum or mucus plugs or hemoptysis or cp or chest tightness, subjective wheeze or overt sinus or hb symptoms. No unusual exp hx or h/o childhood pna/ asthma or knowledge of premature birth.  Sleeping ok without nocturnal  or early am exacerbation  of respiratory  c/o's or need for noct saba. Also denies any obvious fluctuation of symptoms with weather or environmental changes or  other aggravating or alleviating factors except as outlined above   Current Medications, Allergies, Complete Past Medical History, Past Surgical History, Family History, and Social History were reviewed in [employer] record.  ROS  The following are not active complaints unless bolded sore throat, dysphagia, dental problems, itching, sneezing,  nasal congestion or excess/ purulent secretions, ear ache,   fever, chills, sweats, unintended wt loss, classically pleuritic or exertional cp,  orthopnea pnd or leg swelling, presyncope, palpitations, abdominal pain, anorexia, nausea, vomiting, diarrhea  or change in bowel or bladder habits, change in stools or urine, dysuria,hematuria,  rash, arthralgias, visual complaints, headache, numbness, weakness or ataxia or problems with walking or coordination,  change in mood/affect or memory.                 Objective:   Physical Exam  Pleasant min hoarse amb wm nad   05/16/2016          238  02/01/2016        235  01/07/2016          228   12/19/15 229 lb 4 oz (103.987 kg)  12/19/15 229 lb 4 oz (103.987 kg)  12/19/15 227 lb (102.967 kg)    Vital signs reviewed/  Note sats 96% RA on arrival  HEENT: nl dentition, turbinates, and oropharynx. Nl external ear canals without cough reflex   NECK :  without JVD/Nodes/TM/ nl carotid upstrokes bilaterally   LUNGS: no acc muscle use,  Nl contour chest which is clear to A and P bilaterally without cough on insp or exp maneuvers   CV:  RRR  no s3 or murmur or increase in P2, no edema   ABD:  soft and nontender with nl inspiratory excursion in the supine position. No bruits or organomegaly, bowel sounds nl  MS:  Nl gait/ ext warm without deformities, calf tenderness, cyanosis or clubbing No obvious joint restrictions   SKIN: warm and dry without lesions    NEURO:  alert, approp, nl sensorium with  no motor deficits     CXR PA and Lateral:   05/16/2016 :    I personally reviewed  images and agree with radiology impression as follows:   No active cardiopulmonary disease        Lab Results  Component Value Date   ESRSEDRATE 58 (H) 05/16/2016   ESRSEDRATE 18 01/07/2016   ESRSEDRATE 93 (H) 12/19/2015       Assessment & Plan:

## 2016-06-17 NOTE — Patient Instructions (Addendum)
Prednisone 10 mg 2 daily until better, then 1 daily for one week,  Then half daily for a week then stop   If flare start over at 2 daily (call if need more)    Whenever needing prednisone or coughing or hoarse > omeprazole(prilosec) is 20 mg Take 30- 60 min before your first and last meals of the day   For cough > take tessalon up to every 4 hours as needed  Please schedule a follow up office visit in 6 weeks, call sooner if needed cxr

## 2016-06-18 NOTE — Assessment & Plan Note (Signed)
Onset of symptoms feb 2017 with "photo neg pumonary edema pattern" - Allergy profile 12/19/15 >  Eos 0.2 /  IgE  89/ Pos Cat RAST only  12/19/2015  ESR 93 > started on prednisone 20 mg daily  HSP profile 12/19/15 >  Neg  - 01/07/2016 ESR down to 18 but only 30% improved symptomatically > rec continue 20 mg daily until better then 10 mg daily  - 02/01/2016 rec taper off x 6 weeks then ov > 05/16/2016 ESR = 58> restarted pred 06/03/16  The goal with a chronic steroid dependent illness is always arriving at the lowest effective dose that controls the disease/symptoms and not accepting a set "formula" which is based on statistics or guidelines that don't always take into account patient  variability or the natural hx of the dz in every individual patient, which may well vary over time.  For now therefore I recommend the patient maintain  A floor of zero p slow taper and a ceiling of 20 mg daily   I had an extended discussion with the patient and wife reviewing all relevant studies completed to date and  lasting 15 to 20 minutes of a 25 minute visit    Each maintenance medication was reviewed in detail including most importantly the difference between maintenance and prns and under what circumstances the prns are to be triggered using an action plan format that is not reflected in the computer generated alphabetically organized AVS.    Please see instructions for details which were reviewed in writing and the patient given a copy highlighting the part that I personally wrote and discussed at today's ov.  

## 2016-06-18 NOTE — Assessment & Plan Note (Signed)
rec max gerd rx whenever on prednisone

## 2016-07-15 DIAGNOSIS — G4733 Obstructive sleep apnea (adult) (pediatric): Secondary | ICD-10-CM | POA: Diagnosis not present

## 2016-07-29 ENCOUNTER — Encounter: Payer: Self-pay | Admitting: Internal Medicine

## 2016-07-29 ENCOUNTER — Ambulatory Visit (INDEPENDENT_AMBULATORY_CARE_PROVIDER_SITE_OTHER)
Admission: RE | Admit: 2016-07-29 | Discharge: 2016-07-29 | Disposition: A | Discharge status: Home / Self Care | Payer: PPO | Source: Ambulatory Visit | Attending: Internal Medicine | Admitting: Internal Medicine

## 2016-07-29 ENCOUNTER — Ambulatory Visit (INDEPENDENT_AMBULATORY_CARE_PROVIDER_SITE_OTHER): Payer: PPO | Admitting: Internal Medicine

## 2016-07-29 ENCOUNTER — Other Ambulatory Visit (INDEPENDENT_AMBULATORY_CARE_PROVIDER_SITE_OTHER): Payer: PPO

## 2016-07-29 VITALS — BP 128/82 | HR 77 | Ht 70.0 in | Wt 244.0 lb

## 2016-07-29 DIAGNOSIS — R918 Other nonspecific abnormal finding of lung field: Secondary | ICD-10-CM

## 2016-07-29 DIAGNOSIS — E119 Type 2 diabetes mellitus without complications: Secondary | ICD-10-CM

## 2016-07-29 DIAGNOSIS — R05 Cough: Secondary | ICD-10-CM

## 2016-07-29 DIAGNOSIS — I10 Essential (primary) hypertension: Secondary | ICD-10-CM | POA: Diagnosis not present

## 2016-07-29 DIAGNOSIS — R0602 Shortness of breath: Secondary | ICD-10-CM | POA: Diagnosis not present

## 2016-07-29 DIAGNOSIS — R058 Other specified cough: Secondary | ICD-10-CM

## 2016-07-29 LAB — BASIC METABOLIC PANEL
BUN: 37 mg/dL — AB (ref 6–23)
CO2: 29 meq/L (ref 19–32)
Calcium: 9.7 mg/dL (ref 8.4–10.5)
Chloride: 97 mEq/L (ref 96–112)
Creatinine, Ser: 1.51 mg/dL — ABNORMAL HIGH (ref 0.40–1.50)
GFR: 48.41 mL/min — ABNORMAL LOW (ref >60.00)
GLUCOSE: 216 mg/dL — AB (ref 70–99)
POTASSIUM: 4 meq/L (ref 3.5–5.1)
SODIUM: 138 meq/L (ref 135–145)

## 2016-07-29 LAB — HEMOGLOBIN A1C: Hgb A1c MFr Bld: 9.2 % — ABNORMAL HIGH (ref 4.6–6.5)

## 2016-07-29 MED ORDER — GABAPENTIN 100 MG PO CAPS: 100.0000 mg | ORAL_CAPSULE | Freq: Three times a day (TID) | ORAL | 2 refills | Status: DC

## 2016-07-29 MED ORDER — PREDNISONE 10 MG PO TABS: ORAL_TABLET | ORAL | 2 refills | Status: DC

## 2016-07-29 NOTE — Progress Notes (Signed)
Subjective:    Patient ID: Kurt Aguilar, male    DOB: 1945/11/30,   MRN: [id number]    Brief patient profile:  35 yowm never smoker after cabg in [place] started noting recurrent winter cough regardless of where located in [place] ([place]/ [place]) with typical acute episode in [place] in Feb 2017  This time cough  more dry less severe than previous >  abx and pred > improved but did not resolve and worse when stopped it so restarted pred/abx > dx as pneumonia with variable as dz > referred to pulmonary clinic 12/19/2015 by [name] with presumed BOOP   History of Present Illness  12/19/2015 1st [facility] office visit/ Kurt Aguilar   Chief Complaint  Patient presents with  . Pulmonary Consult    Referred by [name]. Pt c/o cough x 4 wks- non prod and worse in the evening and when he lies down. Talking and exertion are things that trigger the cough. He has been on pred x 2 and both times cough resolved and immediately returns once done with med. He also c/o SOB- gets winded just walking from room to room at home.   while on prednisone still some weak and sob but better cough and last dose at least one week and worse overall since off it assoc with sob room to room and weakness/ excess/ purulent sputum or mucus plugs / no unusual exp / no ctd.  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 Follow-up    Cough and SOB have improved some, but not back to his normal baseline. He states still feeling very weak.   no longer needing cough med, walking down block and back slower than baseline rec No change  rx   02/01/2016  f/u ov/Kurt Aguilar re: boop tapered pred to 10 mg as 01/28/16  Chief Complaint  Patient presents with  . Follow-up    pt states he is much improved since last office visit.  SOB has resolved.    Not limited by breathing from desired activities / concerned about wt gain from pred  rec Prednisone 10 mg with bfast x 2 week then 5 mg x 2 weeks then 5 mg even days x 2 weeks and stop  If breathing/cough worse or nausea resume previous dose     05/16/2016  f/u ov/Kurt Aguilar re: boop off pred since early July 2017  Chief Complaint  Patient presents with  . Follow-up    CXR repeated today. Breathing is doing well. No new co's today.   Not limited by breathing from desired activities   rec Please remember to go to the lab   department downstairs for your tests - we will call you with the results when they are available. Weight control iby neg cal balance   06/03/16  PC sick with cough/ sense of chest congestion/ nothing purulent x 3 days and hoarseness  rec pred 20 until better   06/17/2016  f/u ov/Kurt Aguilar re:  prob boop recurrence >> Prednisone down to 10 mg x 3 days  Chief Complaint  Patient presents with  . Follow-up    Had increased cough 2 wks ago- started back on pred 20 mg and stayed on this dose for 9 days, then started on 10 mg daily. He states cough had improved on 20 mg and started worsening again on 10 mg. His cough is non prod.    cough is daytime and dry overall much better   Not limited by breathing from desired activities   rec Prednisone 10 mg 2 daily until better, then 1 daily for one week,  Then half daily for a week then stop  If flare start over at 2 daily (call if need more)  Whenever needing prednisone or coughing or hoarse > omeprazole(prilosec) is 20 mg Take 30- 60 min before your first and last meals of the day  For cough > take tessalon up to every 4 hours as needed    07/29/2016  f/u ov/Kurt Aguilar re:  Chief Complaint  Patient presents with  . Follow-up    6wk  rov. pt states breathing has slightly improved. pt sob with exertion, chest congestion & occ wheezing.  while on omeprazole bid ac and predisone down to 5 mg x 8 days cough worse so started back on 20 mg daily and helped still feel urge to clear the throat daytime only .   Walked all over [place] on 20 mg daily    No obvious day to day or daytime variability or assoc excess/ purulent sputum or mucus plugs or hemoptysis or cp or chest tightness, subjective wheeze or overt sinus or hb symptoms. No unusual exp hx or h/o childhood pna/ asthma or knowledge of premature birth.  Sleeping ok without nocturnal  or early am exacerbation  of respiratory  c/o's or need for noct saba. Also denies any obvious fluctuation of symptoms with weather or environmental changes or other aggravating or alleviating factors except as outlined above   Current Medications, Allergies, Complete Past Medical History, Past Surgical History, Family History, and Social History were reviewed in [employer] record.  ROS  The following are not active complaints unless bolded sore throat, dysphagia, dental problems, itching, sneezing,  nasal congestion or excess/ purulent secretions, ear ache,   fever, chills, sweats, unintended wt loss, classically pleuritic or exertional cp,  orthopnea pnd or leg swelling, presyncope, palpitations, abdominal pain, anorexia, nausea, vomiting, diarrhea  or change in bowel or bladder habits, change in stools or urine, dysuria,hematuria,  rash, arthralgias, visual complaints, headache, numbness, weakness or ataxia or problems with walking or coordination,  change in mood/affect or memory.                 Objective:   Physical Exam  Pleasant min hoarse amb wm nad freq grinding throat clearing during interview/ exam      07/29/2016      244  05/16/2016          238  02/01/2016        235  01/07/2016          228   12/19/15 229 lb 4 oz (103.987 kg)  12/19/15 229 lb 4 oz  (103.987 kg)  12/19/15 227 lb (102.967 kg)    Vital signs reviewed/  Note sats 96% RA on arrival  HEENT: nl dentition, turbinates, and oropharynx. Nl external ear canals without cough reflex   NECK :  without JVD/Nodes/TM/ nl carotid upstrokes bilaterally   LUNGS: no acc muscle use,  Nl contour chest which is  clear to A and P bilaterally without cough on insp or exp maneuvers   CV:  RRR  no s3 or murmur or increase in P2, no edema   ABD:  soft and nontender with nl inspiratory excursion in the supine position. No bruits or organomegaly, bowel sounds nl  MS:  Nl gait/ ext warm without deformities, calf tenderness, cyanosis or clubbing No obvious joint restrictions   SKIN: warm and dry without lesions    NEURO:  alert, approp, nl sensorium with  no motor deficits        CXR PA and Lateral:   07/29/2016 :    I personally reviewed images and agree with radiology impression as follows:    No acute cardiopulmonary disease.  Stable examination.    Labs ordered 07/29/2016  :  RA screen   Lab Results  Component Value Date   ESRSEDRATE 58 (H) 05/16/2016   ESRSEDRATE 18 01/07/2016   ESRSEDRATE 93 (H) 12/19/2015       Assessment & Plan:

## 2016-07-29 NOTE — Patient Instructions (Addendum)
Start gabapentin 100 mg three times a day   For throat clearing use tessalon pearls as needed and keeps lots of candy handy= sugarless life savers and jolly ranchers  Taper prednisone to the lowest dose the works - try taper to 10 alternate with 5 mg to get thru  the holidays   Please remember to go to the lab and x-ray department downstairs for your tests - we will call you with the results when they are available.  Please schedule a follow up office visit in 6 weeks, call sooner if needed

## 2016-07-30 LAB — RHEUMATOID FACTOR

## 2016-07-30 LAB — ANA: Anti Nuclear Antibody(ANA): NEGATIVE

## 2016-07-30 LAB — CYCLIC CITRUL PEPTIDE ANTIBODY, IGG

## 2016-07-30 NOTE — Progress Notes (Signed)
Spoke with pt and notified of results per Dr. Wert. Pt verbalized understanding and denied any questions. 

## 2016-07-31 DIAGNOSIS — R05 Cough: Secondary | ICD-10-CM | POA: Insufficient documentation

## 2016-07-31 DIAGNOSIS — R058 Other specified cough: Secondary | ICD-10-CM | POA: Insufficient documentation

## 2016-07-31 NOTE — Assessment & Plan Note (Signed)
Body mass index is 35.01 kg/m.  Lab Results  Component Value Date   TSH 2.74 02/06/2016     Contributing to gerd tendency/ doe/reviewed the need and the process to achieve and maintain neg calorie balance > defer f/u primary care including intermittently monitoring thyroid status  - will try on this end to work on minimizing systemic steroid exposure going forward

## 2016-07-31 NOTE — Assessment & Plan Note (Signed)
Trial of gabapentin 100 tid 07/29/2016 >>>  Upper airway cough syndrome (previously labeled PNDS) , is  so named because it's frequently impossible to sort out how much is  CR/sinusitis with freq throat clearing (which can be related to primary GERD)   vs  causing  secondary (" extra esophageal")  GERD from wide swings in gastric pressure that occur with throat clearing, often  promoting self use of mint and menthol lozenges that reduce the lower esophageal sphincter tone and exacerbate the problem further in a cyclical fashion.   These are the same pts (now being labeled as having "irritable larynx syndrome" by some cough centers) who not infrequently have a history of having failed to tolerate ace inhibitors,  dry powder inhalers or biphosphonates or report having atypical/extraesophageal reflux symptoms that don't respond to standard doses of PPI  and are easily confused as having aecopd or asthma flares by even experienced allergists/ pulmonologists (myself included).    Will try gabapentin for irritable larynx and in meantime continue max rx for gerd/ try to prevent cyclical throat clearing with use of non mint/menthol lozenges  See avs for details

## 2016-07-31 NOTE — Assessment & Plan Note (Signed)
Onset of symptoms feb 2017 with "photo neg pumonary edema pattern" - Allergy profile 12/19/15 >  Eos 0.2 /  IgE  89/ Pos Cat RAST only  12/19/2015  ESR 93 > started on prednisone 20 mg daily  HSP profile 12/19/15 >  Neg  - 01/07/2016 ESR down to 18 but only 30% improved symptomatically > rec continue 20 mg daily until better then 10 mg daily  - 02/01/2016 rec taper off x 6 weeks then ov > 05/16/2016 ESR = 58> restarted pred 06/03/16  - Collagen Vasc dz screen 07/29/2016  Neg      No evidence at all of recurrent as dz c/w treated boop - the cough has much more of an upper airway pattern now than one assoc with ILD so rec treat it separately and work again on tapering prednisone  The goal with a chronic steroid dependent illness is always arriving at the lowest effective dose that controls the disease/symptoms and not accepting a set "formula" which is based on statistics or guidelines that don't always take into account patient  variability or the natural hx of the dz in every individual patient, which may well vary over time.  For now therefore I recommend the patient maintain  10/5 as new floor with taper  I had an extended discussion with the patient reviewing all relevant studies completed to date and  lasting 15 to 20 minutes of a 25 minute visit    Each maintenance medication was reviewed in detail including most importantly the difference between maintenance and prns and under what circumstances the prns are to be triggered using an action plan format that is not reflected in the computer generated alphabetically organized AVS.    Please see instructions for details which were reviewed in writing and the patient given a copy highlighting the part that I personally wrote and discussed at today's ov.

## 2016-08-12 ENCOUNTER — Encounter: Payer: Self-pay | Admitting: Family Medicine

## 2016-08-12 ENCOUNTER — Ambulatory Visit (INDEPENDENT_AMBULATORY_CARE_PROVIDER_SITE_OTHER): Payer: PPO | Admitting: Family Medicine

## 2016-08-12 VITALS — BP 122/70 | HR 61 | Temp 97.5°F | Ht 70.0 in | Wt 248.5 lb

## 2016-08-12 DIAGNOSIS — E1165 Type 2 diabetes mellitus with hyperglycemia: Secondary | ICD-10-CM

## 2016-08-12 DIAGNOSIS — E119 Type 2 diabetes mellitus without complications: Secondary | ICD-10-CM | POA: Diagnosis not present

## 2016-08-12 DIAGNOSIS — R918 Other nonspecific abnormal finding of lung field: Secondary | ICD-10-CM

## 2016-08-12 DIAGNOSIS — E7800 Pure hypercholesterolemia, unspecified: Secondary | ICD-10-CM

## 2016-08-12 DIAGNOSIS — I10 Essential (primary) hypertension: Secondary | ICD-10-CM

## 2016-08-12 DIAGNOSIS — G8929 Other chronic pain: Secondary | ICD-10-CM

## 2016-08-12 DIAGNOSIS — IMO0001 Reserved for inherently not codable concepts without codable children: Secondary | ICD-10-CM

## 2016-08-12 DIAGNOSIS — M79672 Pain in left foot: Secondary | ICD-10-CM

## 2016-08-12 DIAGNOSIS — K219 Gastro-esophageal reflux disease without esophagitis: Secondary | ICD-10-CM

## 2016-08-12 DIAGNOSIS — N289 Disorder of kidney and ureter, unspecified: Secondary | ICD-10-CM

## 2016-08-12 DIAGNOSIS — E1121 Type 2 diabetes mellitus with diabetic nephropathy: Secondary | ICD-10-CM

## 2016-08-12 MED ORDER — GLIPIZIDE ER 10 MG PO TB24: 10.0000 mg | ORAL_TABLET | Freq: Every day | ORAL | 11 refills | Status: DC

## 2016-08-12 NOTE — Progress Notes (Signed)
Pre visit review using our clinic review tool, if applicable. No additional management support is needed unless otherwise documented below in the visit note. 

## 2016-08-12 NOTE — Progress Notes (Signed)
Subjective:    Patient ID: Kurt Aguilar, male    DOB: Jan 24, 1946, 70 y.o.   MRN: [id number]  HPI Here for f/u of chronic medical problems   Wt Readings from Last 3 Encounters:  08/12/16 248 lb 8 oz (112.7 kg)  07/29/16 244 lb (110.7 kg)  06/17/16 241 lb 12.8 oz (109.7 kg)   bmi is 35.6  Still being treated for pulmonary problems and cannot get off the prednisone  GERD is also worse   Has not been able to exercise for a long time  Chronic heel pain - L foot -? Plantar fasciitis (more pain after inactivity)  Chronic back pain - last saw ortho 3 y ago / ? What his diagnosis is  Intermittent gout     bp is stable today  No cp or palpitations or headaches or edema  No side effects to medicines  BP Readings from Last 3 Encounters:  08/12/16 122/70  07/29/16 128/82  06/17/16 112/82      DM2 Has been on prednisone Lab Results  Component Value Date   HGBA1C 9.2 (H) 07/29/2016  blood glucose - avg fasting 140s/ pp 200s  No low blood sugars  This is up from 7.8 Diet -not been eating well (was previously) - issue of motivation and time and mood  Has done diabetes teaching - good grasp on that  Drinking more alcohol 2-3 per day  Eye exam utd On glipizide 5 mg  Was on metformin years ago at the [place] -was able to stop it - no problems with it ,now renal insuff   Renal insufficiency  Lab Results  Component Value Date   CREATININE 1.51 (H) 07/29/2016   not drinking enough water  Hesitant to try metformin at this time in light of inc cr  Not currently taking colchicine or any nsaid He is on chlortalidone and lasix (as needed) from cardiology    Hx of hyperlipidemia Lab Results  Component Value Date   CHOL 159 02/06/2016   HDL 55.20 02/06/2016   LDLCALC 72 02/06/2016   LDLDIRECT 65.4 07/04/2013   TRIG 163.0 (H) 02/06/2016   CHOLHDL 3 02/06/2016   On crestor    Patient Active Problem List   Diagnosis Date Noted  . Chronic heel pain, left 08/12/2016  . Upper  airway cough syndrome 07/31/2016  . Morbid obesity due to excess calories ([place]) in setting of pred rx  05/19/2016  . Routine general medical examination at a health care facility 01/27/2016  . Pulmonary infiltrates 12/19/2015  . Fatigue 12/12/2015  . Hypotension 12/12/2015  . Lobar pneumonia ([place]) 12/03/2015  . Cough 11/29/2015  . Cystic kidney disease 10/01/2015  . Renal lesion 09/27/2015  . Erectile dysfunction of organic origin 09/27/2015  . BPH with obstruction/lower urinary tract symptoms 09/27/2015  . Constipation 09/24/2015  . Blood in semen 09/24/2015  . Cyst of right kidney 09/24/2015  . Leukocytosis 09/24/2015  . CAD (coronary artery disease) 06/08/2015  . Grieving 11/22/2014  . Left knee injury 06/19/2014  . Encounter for Medicare annual wellness exam 07/11/2013  . Prostate cancer screening 07/03/2013  . Right bundle branch block 02/22/2013  . Chronic low back pain 05/13/2011  . Obstructive sleep apnea 03/15/2010  . Hyperlipidemia 04/12/2009  . Gout 04/12/2009  . Essential hypertension 04/12/2009  . CAD (coronary artery disease) of artery bypass graft 04/12/2009  . GERD 04/12/2009  . Renal insufficiency 04/12/2009  . Diabetes type 2, controlled ([place]) 04/12/2009  . DIVERTICULITIS, HX OF 04/12/2009  Past Medical History:  Diagnosis Date  . Arthritis   . Atypical migraine   . Diabetes ([place])   . Diverticulitis   . GERD (gastroesophageal reflux disease)   . Gout   . Heart attack   . Heart disease   . Hyperglycemia   . Hyperlipidemia   . Hypertension   . Osteoporosis   . Sleep apnea    Past Surgical History:  Procedure Laterality Date  . CATARACT EXTRACTION Bilateral   . CORONARY ARTERY BYPASS GRAFT     [place]  . open heart surgery  09/09/1995-1998   5 bypass  . REPAIR KNEE LIGAMENT     right  . TONSILLECTOMY     Social History  Substance Use Topics  . Smoking status: Former Smoker    Packs/day: 1.00    Years: 3.00    Types: Cigars     Quit date: 11/25/2004  . Smokeless tobacco: Never Used     Comment: occas. cigar quit 2006  . Alcohol use 0.6 oz/week    1 Standard drinks or equivalent per week     Comment: rare   Family History  Problem Relation Age of Onset  . Heart disease Mother   . Heart disease Father   . Stroke Father   . Heart attack Son 71  . Kidney disease Neg Hx   . Prostate cancer Neg Hx    Allergies  Allergen Reactions  . Oxycodone-Acetaminophen     Stops breathing   Current Outpatient Prescriptions on File Prior to Visit  Medication Sig Dispense Refill  . albuterol (VENTOLIN HFA) 108 (90 Base) MCG/ACT inhaler Inhale 2 puffs into the lungs every 4 (four) hours as needed for wheezing or shortness of breath (tight lungs ). 1 Inhaler prn  . allopurinol (ZYLOPRIM) 100 MG tablet Take 1 tablet (100 mg total) by mouth 2 (two) times daily. 180 tablet 3  . aspirin 81 MG tablet Reported on 12/19/2015    . benzonatate (TESSALON) 200 MG capsule Take 1 capsule (200 mg total) by mouth 3 (three) times daily as needed for cough. 60 capsule 0  . chlorthalidone (HYGROTON) 25 MG tablet     . colchicine 0.6 MG tablet Take 1 tablet (0.6 mg total) by mouth 2 (two) times daily as needed. With a meal as needed for acute flare of gout 45 tablet 1  . ezetimibe (ZETIA) 10 MG tablet Take 1 tablet (10 mg total) by mouth daily. 90 tablet 3  . furosemide (LASIX) 20 MG tablet Take 1 tablet (20 mg total) by mouth daily as needed. Reported on 12/19/2015 30 tablet 3  . gabapentin (NEURONTIN) 100 MG capsule Take 1 capsule (100 mg total) by mouth 3 (three) times daily. One three times daily 90 capsule 2  . Glucosamine HCl (GLUCOSAMINE PO) Take 1 tablet by mouth daily. Reported on 12/19/2015    . Lutein 20 MG CAPS Take 1 capsule by mouth daily. Reported on 12/19/2015    . metoprolol tartrate (LOPRESSOR) 25 MG tablet     . omeprazole (PRILOSEC) 20 MG capsule     . polycarbophil (FIBERCON) 625 MG tablet Take 625 mg by mouth daily. Reported on  12/19/2015    . potassium chloride SA (K-DUR,KLOR-CON) 20 MEQ tablet Take 1 tablet (20 mEq total) by mouth daily. 90 tablet 3  . predniSONE (DELTASONE) 10 MG tablet TAKE 20mg  DAILY UNTIL BETTER, THEN 10mg  DAILY 60 tablet 2  . PRODIGY LANCETS 26G MISC 1 each by Does not apply route  daily. To check glucose qd and prn for DM2 (250.00) 100 each 3  . PRODIGY NO CODING BLOOD GLUC test strip CHECK  BLOOD  GLUCOSE ONE TIME DAILY  AND AS NEEDED 200 each 3  . promethazine-codeine (PHENERGAN WITH CODEINE) 6.25-10 MG/5ML syrup Take 5 mLs by mouth every 6 (six) hours as needed for cough. 180 mL 0  . rosuvastatin (CRESTOR) 40 MG tablet Take 1 tablet (40 mg total) by mouth daily. 90 tablet 3  . tadalafil (CIALIS) 20 MG tablet Take 1 tablet (20 mg total) by mouth daily as needed for erectile dysfunction. 10 tablet 12   No current facility-administered medications on file prior to visit.      Review of Systems Review of Systems  Constitutional: Negative for fever, appetite change,  and unexpected weight change. pos for expected weight gain  Eyes: Negative for pain and visual disturbance.  Respiratory: Negative for  shortness of breath.  pos for chronic cough  Cardiovascular: Negative for cp or palpitations    Gastrointestinal: Negative for nausea, diarrhea and constipation.  Genitourinary: Negative for urgency and frequency.  Skin: Negative for pallor or rash   MSK pos for chronic back pain/ foot pain and intermittent gout  Neurological: Negative for weakness, light-headedness, numbness and headaches.  Hematological: Negative for adenopathy. Does not bruise/bleed easily.  Psychiatric/Behavioral: Negative for dysphoric mood. The patient is not nervous/anxious.  pos for general lack of motivation for self care        Objective:   Physical Exam  Constitutional: He appears well-developed and well-nourished. No distress.  Obese and well appearing / round face   HENT:  Head: Normocephalic and atraumatic.    Mouth/Throat: Oropharynx is clear and moist. No oropharyngeal exudate.  Eyes: Conjunctivae and EOM are normal. Pupils are equal, round, and reactive to light. Right eye exhibits no discharge. Left eye exhibits no discharge. No scleral icterus.  Neck: Normal range of motion. Neck supple. No JVD present. Carotid bruit is not present. No thyromegaly present.  Cardiovascular: Normal rate, regular rhythm, normal heart sounds and intact distal pulses.  Exam reveals no gallop.   Pulmonary/Chest: Effort normal and breath sounds normal. No respiratory distress. He has no wheezes. He has no rales. He exhibits no tenderness.  No crackles  Harsh bs , slightly distant  No wheeze even on forced exp Good air exch today  Abdominal: Soft. Bowel sounds are normal. He exhibits no distension, no abdominal bruit and no mass. There is no tenderness.  Musculoskeletal: He exhibits tenderness. He exhibits no edema.  Tenderness of L heel and arch  Limited rom of LS  Lymphadenopathy:    He has no cervical adenopathy.  Neurological: He is alert. He has normal reflexes. No cranial nerve deficit. He exhibits normal muscle tone. Coordination normal.  Skin: Skin is warm and dry. No rash noted. No pallor.  Psychiatric: He has a normal mood and affect.          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        Digestive  GERD     Endocrine   RESOLVED: Diabetes type 2, controlled ([place])   Relevant Medications   glipiZIDE (GLUCOTROL XL) 10 MG 24 hr tablet   Diabetes mellitus type 2, uncontrolled ([place])    Lab Results  Component Value Date    HGBA1C 9.2 (H) 07/29/2016   This is up significantly in the setting of long term prednisone and dietary indiscretion and wt gain  Pt plans to rev DM material and get back on DM diet asap  Will work on eval/tx back and heel pain so he can exercise (and he will begin water exercise now) Work on wt loss He will use the lowest dose of prednisone needed  Inc glipizide xl to 10 mg daily - watching glucose readings bid and alert if low  F/u short term to review these and re check renal labs        Relevant Medications   glipiZIDE (GLUCOTROL XL) 10 MG 24 hr tablet     Genitourinary   Renal insufficiency    Cr is up to 1.51 this draw Hx of kidney cysts  On 2 diuretics Admits to not drinking water Also DM is out of control  Likely multifactorial  Enc more water Avoiding metformin for his DM and all nsaid  Will re check at f/u        Other   Morbid obesity due to excess calories ([place]) in setting of pred rx     Worsened by inc appetite and intake on prednisone and dietary indiscretion  Difficult to control Problem List    No episode was linked to this visit.    Discussed how this problem influences overall health and the risks it imposes  Reviewed plan for weight loss with lower calorie diet (via better food choices and also portion control or program like weight watchers) and exercise building up to or more than 30 minutes 5 days per week including some aerobic activity   Exercise is difficult with chronic back pain and new heel pain  Will begin to work on eval and tx of these problems  Also suggested water exercise which he is on board with  Plans to return to diabetic diet asap as well      Relevant Medications   glipiZIDE (GLUCOTROL XL) 10 MG 24 hr tablet   Hyperlipidemia    Disc goals for lipids and reasons to control them Rev labs with pt Rev low sat fat diet in detail On crestor and diet -no change      Chronic heel pain, left    This is ongoing for a month or so    Suspect plantar fasciitis  Keeping him from exercising  Will refer to podiatry  Feet are well perfused with no neuropathy      Relevant Orders   Ambulatory referral to Podiatry    Other Visit Diagnoses    Controlled type 2 diabetes mellitus without complication, without long-term current use of insulin ([facility])       Relevant Medications   glipiZIDE (GLUCOTROL XL) 10 MG 24 hr tablet

## 2016-08-12 NOTE — Patient Instructions (Addendum)
Cut alcohol out if possible (due to gout and diabetes)  Get back on a diabetic diet asap please !!! Stop at check out for referral to podiatry  Get started with water exercise as well    Increase glipizide xl to 10 mg  Drink a lot more water   Decrease prednisone as tolerated   Follow up in 2-4 weeks with blood sugar log

## 2016-08-13 ENCOUNTER — Telehealth: Payer: Self-pay | Admitting: Cardiovascular Disease

## 2016-08-13 DIAGNOSIS — E1165 Type 2 diabetes mellitus with hyperglycemia: Secondary | ICD-10-CM | POA: Insufficient documentation

## 2016-08-13 DIAGNOSIS — IMO0002 Reserved for concepts with insufficient information to code with codable children: Secondary | ICD-10-CM | POA: Insufficient documentation

## 2016-08-13 DIAGNOSIS — E119 Type 2 diabetes mellitus without complications: Secondary | ICD-10-CM | POA: Insufficient documentation

## 2016-08-13 NOTE — Assessment & Plan Note (Signed)
Worsened by inc appetite and intake on prednisone and dietary indiscretion  Difficult to control Problem List    No episode was linked to this visit.    Discussed how this problem influences overall health and the risks it imposes  Reviewed plan for weight loss with lower calorie diet (via better food choices and also portion control or program like weight watchers) and exercise building up to or more than 30 minutes 5 days per week including some aerobic activity   Exercise is difficult with chronic back pain and new heel pain  Will begin to work on eval and tx of these problems  Also suggested water exercise which he is on board with  Plans to return to diabetic diet asap as well

## 2016-08-13 NOTE — Assessment & Plan Note (Signed)
This is ongoing for a month or so  Suspect plantar fasciitis  Keeping him from exercising  Will refer to podiatry  Feet are well perfused with no neuropathy

## 2016-08-13 NOTE — Assessment & Plan Note (Addendum)
Cr is up to 1.51 this draw Hx of kidney cysts  On 2 diuretics Admits to not drinking water Also DM is out of control  Likely multifactorial  Enc more water Avoiding metformin for his DM and all nsaid  Will re check at f/u

## 2016-08-13 NOTE — Telephone Encounter (Signed)
Received records request [facility], forwarded to [facility] for processing.

## 2016-08-13 NOTE — Assessment & Plan Note (Signed)
Sees pulm-steroid dependent -presumed BOOP

## 2016-08-13 NOTE — Assessment & Plan Note (Signed)
Lab Results  Component Value Date   HGBA1C 9.2 (H) 07/29/2016   This is up significantly in the setting of long term prednisone and dietary indiscretion and wt gain  Pt plans to rev DM material and get back on DM diet asap  Will work on eval/tx back and heel pain so he can exercise (and he will begin water exercise now) Work on wt loss He will use the lowest dose of prednisone needed  Inc glipizide xl to 10 mg daily - watching glucose readings bid and alert if low  F/u short term to review these and re check renal labs

## 2016-08-13 NOTE — Assessment & Plan Note (Signed)
bp in fair control at this time  BP Readings from Last 1 Encounters:  08/12/16 122/70   No changes needed Disc lifstyle change with low sodium diet and exercise  Labs reviewed

## 2016-08-13 NOTE — Assessment & Plan Note (Signed)
Disc goals for lipids and reasons to control them Rev labs with pt Rev low sat fat diet in detail On crestor and diet -no change

## 2016-08-22 ENCOUNTER — Encounter: Payer: Self-pay | Admitting: Podiatry

## 2016-08-22 ENCOUNTER — Ambulatory Visit (INDEPENDENT_AMBULATORY_CARE_PROVIDER_SITE_OTHER): Payer: PPO

## 2016-08-22 ENCOUNTER — Ambulatory Visit (INDEPENDENT_AMBULATORY_CARE_PROVIDER_SITE_OTHER): Payer: PPO | Admitting: Podiatry

## 2016-08-22 DIAGNOSIS — M7662 Achilles tendinitis, left leg: Secondary | ICD-10-CM

## 2016-08-22 DIAGNOSIS — M7732 Calcaneal spur, left foot: Secondary | ICD-10-CM | POA: Diagnosis not present

## 2016-08-22 DIAGNOSIS — M25572 Pain in left ankle and joints of left foot: Secondary | ICD-10-CM

## 2016-08-22 DIAGNOSIS — M722 Plantar fascial fibromatosis: Secondary | ICD-10-CM

## 2016-08-22 MED ORDER — NONFORMULARY OR COMPOUNDED ITEM: 1.0000 g | Freq: Four times a day (QID) | 2 refills | Status: DC

## 2016-08-24 NOTE — Progress Notes (Signed)
Subjective:  Patient presents today as a new patient for pain and tenderness to the left lower extremity.atient states that pain off and on for the past 2-3 years.Patient states that the pain has been significant for the past 2-3 weeks. Patient presents today for further treatment and evaluation. Patient denies trauma.    Objective/Physical Exam General: The patient is alert and oriented x3 in no acute distress.  Dermatology: Skin is warm, dry and supple bilateral lower extremities. Negative for open lesions or macerations.  Vascular: Palpable pedal pulses bilaterally. No edema or erythema noted. Capillary refill within normal limits.  Neurological: Epicritic and protective threshold grossly intact bilaterally.   Musculoskeletal Exam: arge retrocalcaneal heel spurs palpable to the posterior aspect of the leftfootpainful on palpation.Range of motion within normal limits to all pedal and ankle joints bilateral. Muscle strength 5/5 in all groups bilateral.   Radiographic Exam:  arge retrocalcaneal heel spur noted to the posterior aspect of the posterior tubercle of the left calcaneus at the level of the insertion of the Achilles tendon.  Assessment: #1 retrocalcanealheel spurleft lower extremity #2 pain in left lower extremity #3 Achilles bursitis left lower extremity   Plan of Care:  #1 Patient was evaluated. #2 today we discussed all conservativeversus surgical modalities regardingAchilles tendinitis. The patient opts for conservative management which will include shoe gear mopadding, anti-plantar pain cream #3 compression anklet with silicone heel pad dispensed #4 anti-inflammatory pain cream dispensed through [place] #5 return to clinic when necessary   Edrick Kins, DPM [facility]  Dr. Edrick Kins, [place]                                        [place],  [id number]                Office [phone]  Fax [phone]

## 2016-09-02 DIAGNOSIS — M25562 Pain in left knee: Secondary | ICD-10-CM | POA: Diagnosis not present

## 2016-09-02 DIAGNOSIS — S83207A Unspecified tear of unspecified meniscus, current injury, left knee, initial encounter: Secondary | ICD-10-CM | POA: Diagnosis not present

## 2016-09-11 ENCOUNTER — Ambulatory Visit (INDEPENDENT_AMBULATORY_CARE_PROVIDER_SITE_OTHER)
Admission: RE | Admit: 2016-09-11 | Discharge: 2016-09-11 | Disposition: A | Discharge status: Home / Self Care | Payer: PPO | Source: Ambulatory Visit | Attending: Internal Medicine | Admitting: Internal Medicine

## 2016-09-11 ENCOUNTER — Other Ambulatory Visit (INDEPENDENT_AMBULATORY_CARE_PROVIDER_SITE_OTHER): Payer: PPO

## 2016-09-11 ENCOUNTER — Ambulatory Visit (INDEPENDENT_AMBULATORY_CARE_PROVIDER_SITE_OTHER): Payer: PPO | Admitting: Internal Medicine

## 2016-09-11 ENCOUNTER — Encounter: Payer: Self-pay | Admitting: Internal Medicine

## 2016-09-11 VITALS — BP 122/78 | HR 95 | Ht 70.5 in | Wt 252.4 lb

## 2016-09-11 DIAGNOSIS — R918 Other nonspecific abnormal finding of lung field: Secondary | ICD-10-CM

## 2016-09-11 DIAGNOSIS — R05 Cough: Secondary | ICD-10-CM | POA: Diagnosis not present

## 2016-09-11 DIAGNOSIS — R0602 Shortness of breath: Secondary | ICD-10-CM | POA: Diagnosis not present

## 2016-09-11 DIAGNOSIS — R058 Other specified cough: Secondary | ICD-10-CM

## 2016-09-11 LAB — SEDIMENTATION RATE: Sed Rate: 27 mm/hr — ABNORMAL HIGH (ref 0–20)

## 2016-09-11 MED ORDER — OMEPRAZOLE 20 MG PO CPDR: 20.0000 mg | DELAYED_RELEASE_CAPSULE | Freq: Two times a day (BID) | ORAL | Status: DC

## 2016-09-11 MED ORDER — PREDNISONE 10 MG PO TABS: ORAL_TABLET | ORAL | 2 refills | Status: DC

## 2016-09-11 MED ORDER — GABAPENTIN 300 MG PO CAPS: 300.0000 mg | ORAL_CAPSULE | Freq: Three times a day (TID) | ORAL | 11 refills | Status: DC

## 2016-09-11 NOTE — Progress Notes (Signed)
Subjective:    Patient ID: Kurt Aguilar, male    DOB: 11-02-1945,   MRN: [id number]    Brief patient profile:  19 yowm never smoker after cabg in [place] started noting recurrent winter cough regardless of where located in [place] ([place]/ [place]) with typical acute episode in [place] in Feb 2017  This time cough  more dry less severe than previous >  abx and pred > improved but did not resolve and worse when stopped it so restarted pred/abx > dx as pneumonia with variable as dz > referred to pulmonary clinic 12/19/2015 by [name] with presumed BOOP   History of Present Illness  12/19/2015 1st [facility] office visit/ Kurt Aguilar   Chief Complaint  Patient presents with  . Pulmonary Consult    Referred by [name]. Pt c/o cough x 4 wks- non prod and worse in the evening and when he lies down. Talking and exertion are things that trigger the cough. He has been on pred x 2 and both times cough resolved and immediately returns once done with med. He also c/o SOB- gets winded just walking from room to room at home.   while on prednisone still some weak and sob but better cough and last dose at least one week and worse overall since off it assoc with sob room to room and weakness/ excess/ purulent sputum or mucus plugs / no unusual exp / no ctd.  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 Follow-up    Cough and SOB have improved some, but not back to his normal baseline. He states still feeling very weak.   no longer needing cough med, walking down block and back slower than baseline rec No change  rx   02/01/2016  f/u ov/Kurt Aguilar re: boop tapered pred to 10 mg as 01/28/16  Chief Complaint  Patient presents with  . Follow-up    pt states he is much improved since last office visit.  SOB has resolved.    Not limited by breathing from desired activities / concerned about wt gain from pred  rec Prednisone 10 mg with bfast x 2 week then 5 mg x 2 weeks then 5 mg even days x 2 weeks and stop  If breathing/cough worse or nausea resume previous dose     05/16/2016  f/u ov/Kurt Aguilar re: boop off pred since early July 2017  Chief Complaint  Patient presents with  . Follow-up    CXR repeated today. Breathing is doing well. No new co's today.   Not limited by breathing from desired activities   rec Please remember to go to the lab   department downstairs for your tests - we will call you with the results when they are available. Weight control iby neg cal balance   06/03/16  PC sick with cough/ sense of chest congestion/ nothing purulent x 3 days and hoarseness  rec pred 20 until better   06/17/2016  f/u ov/Kurt Aguilar re:  prob boop recurrence >> Prednisone down to 10 mg x 3 days  Chief Complaint  Patient presents with  . Follow-up    Had increased cough 2 wks ago- started back on pred 20 mg and stayed on this dose for 9 days, then started on 10 mg daily. He states cough had improved on 20 mg and started worsening again on 10 mg. His cough is non prod.    cough is daytime and dry overall much better   Not limited by breathing from desired activities   rec Prednisone 10 mg 2 daily until better, then 1 daily for one week,  Then half daily for a week then stop  If flare start over at 2 daily (call if need more)  Whenever needing prednisone or coughing or hoarse > omeprazole(prilosec) is 20 mg Take 30- 60 min before your first and last meals of the day  For cough > take tessalon up to every 4 hours as needed    07/29/2016  f/u ov/Kurt Aguilar re: ? boop recurrence  Chief Complaint  Patient presents with  .  Follow-up    6wk rov. pt states breathing has slightly improved. pt sob with exertion, chest congestion & occ wheezing.  while on omeprazole bid ac and predisone down to 5 mg x 8 days cough worse so started back on 20 mg daily and helped still feel urge to clear the throat daytime only .   Walked all over [place] on 20 mg daily  rec Start gabapentin 100 mg three times a day  For throat clearing use tessalon pearls as needed and keeps lots of candy handy= sugarless life savers and jolly ranchers Taper prednisone to the lowest dose the works - try taper to 10 alternate with 5 mg to get thru  the holidays      09/11/2016  f/u ov/Kurt Aguilar re: BOOP/uacs  on pred 10/5  prilosec 20 mg ac and hs  Chief Complaint  Patient presents with  . Follow-up    Increased SOB for the past 3-4 days. He gets SOB with exertion such as getting dressed or walking accross the room.  He has also started coughing more "feels like I burned my lungs"- prod with white sputum.     was doing ok on 10/5 in terms of breathing but worse gradually x one week p several weeks on the 10/5 dose  Cough some better but still needing freq tessalon on gabapentin 100 tid       No obvious day to day or daytime variability or assoc excess/ purulent sputum or mucus plugs or hemoptysis or cp or chest tightness, subjective wheeze or overt sinus or hb symptoms. No unusual exp hx or h/o childhood pna/ asthma or knowledge of premature birth.  Sleeping ok without nocturnal  or early am exacerbation  of respiratory  c/o's or need for noct saba. Also denies any obvious fluctuation of symptoms with weather or environmental changes or other aggravating or alleviating factors except as outlined above   Current Medications, Allergies, Complete Past Medical History, Past Surgical History, Family History, and Social History were reviewed in [employer] record.  ROS  The following are not active complaints unless bolded sore throat,  dysphagia, dental problems, itching, sneezing,  nasal congestion or excess/ purulent secretions, ear ache,   fever, chills, sweats, unintended wt loss, classically pleuritic or exertional cp,  orthopnea pnd or leg swelling, presyncope, palpitations, abdominal pain, anorexia, nausea, vomiting, diarrhea  or change in bowel or bladder habits, change in stools or urine, dysuria,hematuria,  rash, arthralgias baseline ,  visual complaints, headache, numbness, weakness or ataxia or problems with walking or coordination,  change in mood/affect or memory.                 Objective:   Physical Exam  Pleasant min hoarse amb wm occ throat clearng    09/11/2016          252  07/29/2016      244  05/16/2016          238  02/01/2016        235  01/07/2016          228   12/19/15 229 lb 4 oz (103.987 kg)  12/19/15 229 lb 4 oz (103.987 kg)  12/19/15 227 lb (102.967 kg)    Vital signs reviewed/  Note sats 92% RA on arrival  HEENT: nl dentition, turbinates, and oropharynx. Nl external ear canals without cough reflex   NECK :  without JVD/Nodes/TM/ nl carotid upstrokes bilaterally   LUNGS: no acc muscle use,  Nl contour chest  A few insp crackles in bases with  cough on insp maneuvers   CV:  RRR  no s3 or murmur or increase in P2, no edema   ABD:  soft and nontender with nl inspiratory excursion in the supine position. No bruits or organomegaly, bowel sounds nl  MS:  Nl gait/ ext warm without deformities, calf tenderness, cyanosis or clubbing No obvious joint restrictions   SKIN: warm and dry without lesions    NEURO:  alert, approp, nl sensorium with  no motor deficits      CXR PA and Lateral:   09/11/2016 :    I personally reviewed images and agree with radiology impression as follows:    No active lung disease.  Stable borderline cardiomegaly.     Lab Results  Component Value Date   ESRSEDRATE 27 (H) 09/11/2016   ESRSEDRATE 58 (H) 05/16/2016   ESRSEDRATE 18 01/07/2016               Assessment & Plan:

## 2016-09-11 NOTE — Progress Notes (Signed)
LMTCB

## 2016-09-11 NOTE — Assessment & Plan Note (Signed)
Trial of gabapentin 100 tid 07/29/2016 >  Still tessalon dep 09/11/2016 so increased to 300 tid and ppi bid ac    He clearly has two mechanisms of cough, the one on deep insp typical for ILD, the second with throat clearing c/w uacs/ irirtable larynx vs gerd vs combination so max rx for all three potential mechs and regroup when returns from [place] with pfts on return   I had an extended discussion with the patient reviewing all relevant studies completed to date and  lasting 15 to 20 minutes of a 25 minute visit    Each maintenance medication was reviewed in detail including most importantly the difference between maintenance and prns and under what circumstances the prns are to be triggered using an action plan format that is not reflected in the computer generated alphabetically organized AVS.    Please see AVS for specific instructions unique to this visit that I personally wrote and verbalized to the the pt in detail and then reviewed with pt  by my nurse highlighting any  changes in therapy recommended at today's visit to their plan of care.

## 2016-09-11 NOTE — Assessment & Plan Note (Addendum)
Body mass index is 35.7  Still trending up  Lab Results  Component Value Date   TSH 2.74 02/06/2016     Contributing to gerd tendency/ doe/reviewed the need and the process to achieve and maintain neg calorie balance > defer f/u primary care including intermittently monitoring thyroid status

## 2016-09-11 NOTE — Assessment & Plan Note (Addendum)
Onset of symptoms feb 2017 with "photo neg pumonary edema pattern" - Allergy profile 12/19/15 >  Eos 0.2 /  IgE  89/ Pos Cat RAST only  12/19/2015  ESR 93 > started on prednisone 20 mg daily  HSP profile 12/19/15 >  Neg  - 01/07/2016 ESR down to 18 but only 30% improved symptomatically > rec continue 20 mg daily until better then 10 mg daily  - 02/01/2016 rec taper off x 6 weeks then ov > 05/16/2016 ESR = 58> restarted pred 06/03/16  - Collagen Vasc dz screen 07/29/2016  Neg    Symptoms non specific as is the ESR of 27 but flared on 10/ 5 > The goal with a chronic steroid dependent illness is always arriving at the lowest effective dose that controls the disease/symptoms and not accepting a set "formula" which is based on statistics or guidelines that don't always take into account patient  variability or the natural hx of the dz in every individual patient, which may well vary over time.  For now therefore I recommend the patient maintain  A ceiling of 20 mg until better then titrate down to floor of 10/5 until returns from [place]  Discussed in detail all the  indications, usual  risks and alternatives  relative to the benefits with patient/wife  who agrees to proceed with conservative f/u as outlined

## 2016-09-11 NOTE — Progress Notes (Signed)
Spoke with pt and notified of results per Dr. Wert. Pt verbalized understanding and denied any questions. 

## 2016-09-11 NOTE — Patient Instructions (Addendum)
Prednisone 10 mg  2 daily until better then 1 daily x 2 weeks then 10 alternating with 5 until return  Prilosec should be 20 mg Take 30- 60 min before your first and last meals of the day   Increase gabapentin to 300 mg three times daily   Please remember to go to the lab and x-ray department downstairs for your tests - we will call you with the results when they are available.     Please schedule a follow up office visit in 8 weeks, sooner if needed with pfts on return

## 2016-09-12 ENCOUNTER — Telehealth: Payer: Self-pay

## 2016-09-12 ENCOUNTER — Ambulatory Visit (INDEPENDENT_AMBULATORY_CARE_PROVIDER_SITE_OTHER): Payer: PPO | Admitting: Family Medicine

## 2016-09-12 ENCOUNTER — Encounter: Payer: Self-pay | Admitting: Family Medicine

## 2016-09-12 VITALS — BP 136/86 | HR 61 | Temp 97.9°F | Ht 70.5 in | Wt 252.0 lb

## 2016-09-12 DIAGNOSIS — IMO0001 Reserved for inherently not codable concepts without codable children: Secondary | ICD-10-CM

## 2016-09-12 DIAGNOSIS — E1165 Type 2 diabetes mellitus with hyperglycemia: Secondary | ICD-10-CM

## 2016-09-12 DIAGNOSIS — N289 Disorder of kidney and ureter, unspecified: Secondary | ICD-10-CM | POA: Diagnosis not present

## 2016-09-12 DIAGNOSIS — I10 Essential (primary) hypertension: Secondary | ICD-10-CM

## 2016-09-12 LAB — RENAL FUNCTION PANEL
ALBUMIN: 4.3 g/dL (ref 3.5–5.2)
BUN: 38 mg/dL — ABNORMAL HIGH (ref 6–23)
CHLORIDE: 97 meq/L (ref 96–112)
CO2: 32 mEq/L (ref 19–32)
Calcium: 9.7 mg/dL (ref 8.4–10.5)
Creatinine, Ser: 1.43 mg/dL (ref 0.40–1.50)
GFR: 51.53 mL/min — ABNORMAL LOW (ref >60.00)
Glucose, Bld: 135 mg/dL — ABNORMAL HIGH (ref 70–99)
Phosphorus: 3.4 mg/dL (ref 2.3–4.6)
Potassium: 3.6 mEq/L (ref 3.5–5.1)
SODIUM: 138 meq/L (ref 135–145)

## 2016-09-12 NOTE — Progress Notes (Signed)
Pre visit review using our clinic review tool, if applicable. No additional management support is needed unless otherwise documented below in the visit note. 

## 2016-09-12 NOTE — Telephone Encounter (Signed)
Pt called his insurance to see which meds would be covered for diabetes, he said without the names of medications they won't give him a list of meds covered he would like to know which meds he needs to check on .

## 2016-09-12 NOTE — Telephone Encounter (Signed)
Pt was given the name of meds, he will check and let us know

## 2016-09-12 NOTE — Patient Instructions (Signed)
Lab today  Stop bedtime snack unless glucose is low  Keep doing water aerobics  Aim for at least 64 oz of water per day  Please call your insurance co - I need to know what diabetes medicines are covered -- I am most curious about lantus insulin (or any brand of basal insulin) or januvia  Then we will decide what to try next  Take care of yourself   Follow up in 3 months with labs prior

## 2016-09-12 NOTE — Telephone Encounter (Signed)
It sounds like they are giving him the run around.  All insurance companies (I believe) are required to supply their clients with a book or computer access to a list of all medications they cover and what levels or tiers they are in    (usually in a benefits book or computer file)   In any case I am curious about Lantus or Levemir insulin, Januvia, Invokana and Trulicity

## 2016-09-12 NOTE — Assessment & Plan Note (Signed)
Recently inc prednisone Was improving with inc glipzide and better diet and exercise Ref glucose readings  Enc better habits Will call ins re: next med that is affordable (perhaps [place] or basal insulin) F/u 3 mo or earlier if needed  Will keep working on wt loss

## 2016-09-12 NOTE — Assessment & Plan Note (Signed)
Drinking more water  Lab today

## 2016-09-12 NOTE — Progress Notes (Signed)
Subjective:    Patient ID: Kurt Aguilar, male    DOB: 12/27/1945, 71 y.o.   MRN: [id number]  HPI  Here for f/u of DM and renal insufficiency  Yesterday felt off balance and tired  Improved today   Wt Readings from Last 3 Encounters:  09/12/16 252 lb (114.3 kg)  09/11/16 252 lb 6.4 oz (114.5 kg)  08/12/16 248 lb 8 oz (112.7 kg)  no gain  bmi is 35.6  Still on prednisone-yesterday it was increased  Was dx with BOOP - is unable to get off the prednisone   Lab Results  Component Value Date   HGBA1C 9.2 (H) 07/29/2016  he has changed diet - now eats 3 oz of meat and vegetables  Stopped all sweets  Also stopped alcohol for the most part  No sugar beverages  Now he is eating a bedtime snack-occ cheese/nuts/ or raw veg  This was up with bad diet  Inc glipizide from 5 to 10 mg xl  Avoiding metformin for now due to renal insuff  Am glucose is labile - few days as low as 92-120  occ over 300  In evening mid 100s to mid 200s   Perhaps better on exercise days Water exercise     Made his knee hurt a bit / but he loved it overall     Lab Results  Component Value Date   CREATININE 1.51 (H) 07/29/2016   BUN 37 (H) 07/29/2016   NA 138 07/29/2016   K 4.0 07/29/2016   CL 97 07/29/2016   CO2 29 07/29/2016    Since then drinking more water  Is drinking more water 4-5 twelve oz glasses  Feels like he still could do better   Patient Active Problem List   Diagnosis Date Noted  . Diabetes mellitus type 2, uncontrolled ([place]) 08/13/2016  . Chronic heel pain, left 08/12/2016  . Upper airway cough syndrome 07/31/2016  . Morbid obesity due to excess calories ([place]) in setting of pred rx  05/19/2016  . Routine general medical examination at a health care facility 01/27/2016  . Pulmonary infiltrates 12/19/2015  . Fatigue 12/12/2015  . Hypotension 12/12/2015  . Cough 11/29/2015  . Cystic kidney disease 10/01/2015  . Renal lesion 09/27/2015  . Erectile dysfunction of organic  origin 09/27/2015  . BPH with obstruction/lower urinary tract symptoms 09/27/2015  . Constipation 09/24/2015  . Blood in semen 09/24/2015  . Cyst of right kidney 09/24/2015  . Leukocytosis 09/24/2015  . CAD (coronary artery disease) 06/08/2015  . Grieving 11/22/2014  . Encounter for Medicare annual wellness exam 07/11/2013  . Prostate cancer screening 07/03/2013  . Right bundle branch block 02/22/2013  . Chronic low back pain 05/13/2011  . Obstructive sleep apnea 03/15/2010  . Hyperlipidemia 04/12/2009  . Gout 04/12/2009  . Essential hypertension 04/12/2009  . CAD (coronary artery disease) of artery bypass graft 04/12/2009  . GERD 04/12/2009  . Renal insufficiency 04/12/2009  . DIVERTICULITIS, HX OF 04/12/2009   Past Medical History:  Diagnosis Date  . Arthritis   . Atypical migraine   . Diabetes ([place])   . Diverticulitis   . GERD (gastroesophageal reflux disease)   . Gout   . Heart attack   . Heart disease   . Hyperglycemia   . Hyperlipidemia   . Hypertension   . Osteoporosis   . Sleep apnea    Past Surgical History:  Procedure Laterality Date  . CATARACT EXTRACTION Bilateral   . CORONARY ARTERY BYPASS GRAFT  [place]  . open heart surgery  09/09/1995-1998   5 bypass  . REPAIR KNEE LIGAMENT     right  . TONSILLECTOMY     Social History  Substance Use Topics  . Smoking status: Former Smoker    Packs/day: 1.00    Years: 3.00    Types: Cigars    Quit date: 11/25/2004  . Smokeless tobacco: Never Used     Comment: occas. cigar quit 2006  . Alcohol use 0.6 oz/week    1 Standard drinks or equivalent per week     Comment: rare   Family History  Problem Relation Age of Onset  . Heart disease Mother   . Heart disease Father   . Stroke Father   . Heart attack Son 19  . Kidney disease Neg Hx   . Prostate cancer Neg Hx    Allergies  Allergen Reactions  . Oxycodone-Acetaminophen     Stops breathing   Current Outpatient Prescriptions on File Prior  to Visit  Medication Sig Dispense Refill  . albuterol (VENTOLIN HFA) 108 (90 Base) MCG/ACT inhaler Inhale 2 puffs into the lungs every 4 (four) hours as needed for wheezing or shortness of breath (tight lungs ). 1 Inhaler prn  . allopurinol (ZYLOPRIM) 100 MG tablet Take 1 tablet (100 mg total) by mouth 2 (two) times daily. 180 tablet 3  . aspirin 81 MG tablet Reported on 12/19/2015    . benzonatate (TESSALON) 200 MG capsule Take 1 capsule (200 mg total) by mouth 3 (three) times daily as needed for cough. 60 capsule 0  . chlorthalidone (HYGROTON) 25 MG tablet     . colchicine 0.6 MG tablet Take 1 tablet (0.6 mg total) by mouth 2 (two) times daily as needed. With a meal as needed for acute flare of gout 45 tablet 1  . ezetimibe (ZETIA) 10 MG tablet Take 1 tablet (10 mg total) by mouth daily. 90 tablet 3  . furosemide (LASIX) 20 MG tablet Take 1 tablet (20 mg total) by mouth daily as needed. Reported on 12/19/2015 30 tablet 3  . gabapentin (NEURONTIN) 300 MG capsule Take 1 capsule (300 mg total) by mouth 3 (three) times daily. 90 capsule 11  . glipiZIDE (GLUCOTROL XL) 10 MG 24 hr tablet Take 1 tablet (10 mg total) by mouth daily with breakfast. 30 tablet 11  . Glucosamine HCl (GLUCOSAMINE PO) Take 1 tablet by mouth daily. Reported on 12/19/2015    . Lutein 20 MG CAPS Take 1 capsule by mouth daily. Reported on 12/19/2015    . metoprolol tartrate (LOPRESSOR) 25 MG tablet Take 25 mg by mouth 2 (two) times daily.     . NONFORMULARY OR COMPOUNDED ITEM Apply 1-2 g topically 4 (four) times daily. 120 each 2  . omeprazole (PRILOSEC) 20 MG capsule Take 1 capsule (20 mg total) by mouth 2 (two) times daily before a meal.    . polycarbophil (FIBERCON) 625 MG tablet Take 625 mg by mouth daily. Reported on 12/19/2015    . potassium chloride SA (K-DUR,KLOR-CON) 20 MEQ tablet Take 1 tablet (20 mEq total) by mouth daily. 90 tablet 3  . predniSONE (DELTASONE) 10 MG tablet TAKE 20mg  DAILY UNTIL BETTER, THEN 10mg  DAILY x 2  weeks then one alternating with one half 100 tablet 2  . PRODIGY LANCETS 26G MISC 1 each by Does not apply route daily. To check glucose qd and prn for DM2 (250.00) 100 each 3  . PRODIGY NO CODING BLOOD GLUC test strip  CHECK  BLOOD  GLUCOSE ONE TIME DAILY  AND AS NEEDED 200 each 3  . promethazine-codeine (PHENERGAN WITH CODEINE) 6.25-10 MG/5ML syrup Take 5 mLs by mouth every 6 (six) hours as needed for cough. 180 mL 0  . rosuvastatin (CRESTOR) 40 MG tablet Take 1 tablet (40 mg total) by mouth daily. 90 tablet 3  . tadalafil (CIALIS) 20 MG tablet Take 1 tablet (20 mg total) by mouth daily as needed for erectile dysfunction. 10 tablet 12   No current facility-administered medications on file prior to visit.     Review of Systems Review of Systems  Constitutional: Negative for fever, appetite change, and unexpected weight change. pos for periods of fatigue and general malaise  Eyes: Negative for pain and visual disturbance.  Respiratory: pos for baseline cough and shortness of breath.  neg for wheezing today Cardiovascular: Negative for cp or palpitations    Gastrointestinal: Negative for nausea, diarrhea and constipation.  Genitourinary: Negative for urgency and frequency.  Skin: Negative for pallor or rash   Neurological: Negative for weakness, light-headedness, numbness and headaches.  Hematological: Negative for adenopathy. Does not bruise/bleed easily.  Psychiatric/Behavioral: Negative for dysphoric mood. The patient is not nervous/anxious.         Objective:   Physical Exam  Constitutional: He appears well-developed and well-nourished. No distress.  obese and well appearing   HENT:  Head: Normocephalic and atraumatic.  Mouth/Throat: Oropharynx is clear and moist.  Eyes: Conjunctivae and EOM are normal. Pupils are equal, round, and reactive to light.  Neck: Normal range of motion. Neck supple. No JVD present. Carotid bruit is not present. No thyromegaly present.  Cardiovascular:  Normal rate, regular rhythm, normal heart sounds and intact distal pulses.  Exam reveals no gallop.   Pulmonary/Chest: Effort normal and breath sounds normal. No respiratory distress. He has no wheezes. He has no rales.  No crackles  Diffusely distant bs  No wheeze Good air exch  Abdominal: Soft. Bowel sounds are normal. He exhibits no distension, no abdominal bruit and no mass. There is no tenderness.  Musculoskeletal: He exhibits no edema.  Lymphadenopathy:    He has no cervical adenopathy.  Neurological: He is alert. He has normal reflexes.  Skin: Skin is warm and dry. No rash noted.  Psychiatric: He has a normal mood and affect.          Assessment & Plan:   Problem List Items Addressed This Visit      Cardiovascular and Mediastinum   Essential hypertension - Primary    bp in fair control at this time  BP Readings from Last 1 Encounters:  09/12/16 136/86   No changes needed Disc lifstyle change with low sodium diet and exercise  Latest labs reviewed  Renal panel drawn today        Endocrine   Diabetes mellitus type 2, uncontrolled ([place])    Recently inc prednisone Was improving with inc glipzide and better diet and exercise Ref glucose readings  Enc better habits Will call ins re: next med that is affordable (perhaps [place] or basal insulin) F/u 3 mo or earlier if needed  Will keep working on wt loss         Genitourinary   Renal insufficiency    Drinking more water  Lab today       Relevant Orders   Renal function panel (Completed)     Other   Morbid obesity due to excess calories ([place]) in setting of pred rx  He is fighting an uphill battle with this Discussed how this problem influences overall health and the risks it imposes  Reviewed plan for weight loss with lower calorie diet (via better food choices and also portion control or program like weight watchers) and exercise building up to or more than 30 minutes 5 days per week including some  aerobic activity   Will keep doing water aerobics

## 2016-09-12 NOTE — Assessment & Plan Note (Signed)
He is fighting an uphill battle with this Discussed how this problem influences overall health and the risks it imposes  Reviewed plan for weight loss with lower calorie diet (via better food choices and also portion control or program like weight watchers) and exercise building up to or more than 30 minutes 5 days per week including some aerobic activity   Will keep doing water aerobics

## 2016-09-13 NOTE — Assessment & Plan Note (Signed)
bp in fair control at this time  BP Readings from Last 1 Encounters:  09/12/16 136/86   No changes needed Disc lifstyle change with low sodium diet and exercise  Latest labs reviewed  Renal panel drawn today

## 2016-09-14 ENCOUNTER — Encounter: Payer: Self-pay | Admitting: Internal Medicine

## 2016-09-15 MED ORDER — INSULIN GLARGINE 100 UNIT/ML SOLOSTAR PEN: 50.0000 [IU] | PEN_INJECTOR | Freq: Every day | SUBCUTANEOUS | 11 refills | Status: DC

## 2016-09-15 NOTE — Telephone Encounter (Signed)
I sent lantus insulin solostar pen to his pharmacy The px says 50 u qhs  However I want him to follow this schedule   Week one : 10 u QHS Week two 20 u QHS Week three 30 u QHS Stop increasing this if glucose levels become low  At 30 units call and leave a message re: how glucose readings are and we will go from there   Please see what kind of needles he will need for the pen and send those in also  Thanks   If he needs inst re: how to use it - either see if the folks at [facility] can teach him or see if perhaps [facility] could teach him here

## 2016-09-15 NOTE — Telephone Encounter (Signed)
Pt left v/m the meds that [name] had asked pt to ck with ins co; pt did ck and all meds offered by [name] on 09/12/16 phone note is Tier 3. Pt request cb with which med [name] wants pt to start.

## 2016-09-16 ENCOUNTER — Telehealth: Payer: Self-pay

## 2016-09-16 ENCOUNTER — Other Ambulatory Visit: Payer: Self-pay

## 2016-09-16 MED ORDER — INSULIN PEN NEEDLE 31G X 8 MM MISC: 2 refills | Status: DC

## 2016-09-16 MED ORDER — INSULIN GLARGINE 100 UNIT/ML SOLOSTAR PEN: 50.0000 [IU] | PEN_INJECTOR | Freq: Every day | SUBCUTANEOUS | 3 refills | Status: DC

## 2016-09-16 NOTE — Telephone Encounter (Signed)
Continue the glipizide Thanks

## 2016-09-16 NOTE — Telephone Encounter (Signed)
Error see phone note dated 09/16/16.

## 2016-09-16 NOTE — Telephone Encounter (Signed)
Pt going to [place] in couple of weeks for 3 months and request 3 mth rx for lantus sent to [facility] [address]; Medication phoned to pharmacy as instructed and cancelled rx for # 15 ml. Pt also wants to verify if he should stop or continue the glipizide when starts the lantus. Pt request cb.

## 2016-09-16 NOTE — Telephone Encounter (Signed)
Patient advised.

## 2016-09-16 NOTE — Telephone Encounter (Signed)
Pt notified of Dr. Marliss Coots comments and instructions and verbalized understanding. Rx for needles sent to pharmacy

## 2016-09-18 ENCOUNTER — Other Ambulatory Visit: Payer: Self-pay | Admitting: Family Medicine

## 2016-09-18 ENCOUNTER — Other Ambulatory Visit: Payer: Self-pay | Admitting: Internal Medicine

## 2016-09-18 DIAGNOSIS — H353131 Nonexudative age-related macular degeneration, bilateral, early dry stage: Secondary | ICD-10-CM | POA: Diagnosis not present

## 2016-09-18 NOTE — Telephone Encounter (Signed)
Will refill electronically  

## 2016-09-24 ENCOUNTER — Other Ambulatory Visit: Payer: Self-pay | Admitting: Family Medicine

## 2016-09-26 ENCOUNTER — Other Ambulatory Visit: Payer: Self-pay

## 2016-09-26 MED ORDER — GLUCOSE BLOOD VI STRP: ORAL_STRIP | 5 refills | Status: DC

## 2016-09-26 NOTE — Telephone Encounter (Signed)
Pt request prodigy test strips to [facility] [address]. Advised pt done per protocol.

## 2016-10-06 ENCOUNTER — Telehealth: Payer: Self-pay | Admitting: Family Medicine

## 2016-10-06 NOTE — Telephone Encounter (Signed)
Pt said since starting lantus BS are low in AM; lowest was 74 and then range from 90's - 169. This morning FBS was 169 but ate spaghetti last night  and by afternoon can be up to 400. Pt taking Lantus 30 units at hs daily. Pt said only symptom is pt has been sleeping more than usual but pt said was sleeping more than usual before starting lantus in Jan. 2018. Pt leaving today at 12 noon for [facility] and request cb ASAP. Pt wants to know if can adjust dosage of med rather than being seen. No available appt at [facility] or [place] this morning prior to pt going out of town. [address]

## 2016-10-06 NOTE — Telephone Encounter (Signed)
Pt is still on 30 units of lantus so I advise pt of Dr. Marliss Coots instructions and pt verbalized understanding pt will update [place] later this week

## 2016-10-06 NOTE — Telephone Encounter (Signed)
I assume he is still on the 30 units - let me know if not  Change dosing to just before lunch- let's see if that makes a difference, and do best you can with diet  Let me know later in the week how he is doing by phone and then we can adj dose If necessary

## 2016-10-06 NOTE — Telephone Encounter (Signed)
[facility]  Patient Name: ISABEL FREESE  DOB: May 02, 1946    Initial Comment [name] states he was recently put on insulin(a month ago.) His bs has been erratic, and he has been sleeping more during the day. 169 this morning, yesterday in the 200's.   Nurse Assessment  Nurse: Wynetta Emery, RN, Baker Janus Date/Time Eilene Ghazi Time): 10/06/2016 9:12:24 AM  Confirm and document reason for call. If symptomatic, describe symptoms. ---Fritz Pickerel is new diabetic and started on Lantus 30 units at night glibizide at breakfast-- states he notes the bs is very erratic starts out low and gets high during the day  Does the patient have any new or worsening symptoms? ---Yes  Will a triage be completed? ---Yes  Related visit to physician within the last 2 weeks? ---Yes  Does the PT have any chronic conditions? (i.e. diabetes, asthma, etc.) ---Yes  List chronic conditions. ---Type 2  Is this a behavioral health or substance abuse call? ---No     Guidelines    Guideline Title Affirmed Question Affirmed Notes       Final Disposition User        Comments  Nurse tried to make appointment none available. Nurse would like him to be seen this am before he leaves to go to [place] at [place] today is there any way we can get him in before he leaves for [place].

## 2016-10-18 ENCOUNTER — Other Ambulatory Visit: Payer: Self-pay | Admitting: Family Medicine

## 2016-10-22 ENCOUNTER — Telehealth: Payer: Self-pay

## 2016-10-22 NOTE — Telephone Encounter (Signed)
I spoke with pt;pt is presently in [facility] and will not return home until around 11/08/16. On 10/18/16 pt felt nauseated, dizzy and BP was 92/62. On 10/19/16 pt stopped taking chlorthalidone; pt also concerned about BS; on 10/18/16 afternoon BS was 484. Pt is presently taking lantus 30 units at 10 PM. 10/22/16 FBS was 168 and BP today was 118/81. Pt feels good today; no H/A, dizziness,CP or SOB. Pt scheduled 30 min appt on 11/12/16 at 12 noon(this was first appt that met pts schedule needs). Pt wonders if [name] might advise pt on how to adjust his dosage on chlorthalidone and lantus. Pt request cb today after 10:30 AM. Pt agreed if his condition worsened again [facility]

## 2016-10-22 NOTE — Telephone Encounter (Signed)
Keep holding chlorthalidone and watch bp  Inc his lantus from 30 to 40 units daily and see how blood glucose does  Keep me posted  maks sure you are drinking enough fluids

## 2016-10-22 NOTE — Telephone Encounter (Signed)
PLEASE NOTE: All timestamps contained within this report are represented as [place] Standard Time. CONFIDENTIALTY NOTICE: This fax transmission is intended only for the addressee. It contains information that is legally privileged, confidential or otherwise protected from use or disclosure. If you are not the intended recipient, you are strictly prohibited from reviewing, disclosing, copying using or disseminating any of this information or taking any action in reliance on or regarding this information. If you have received this fax in error, please notify us immediately by telephone so that we can arrange for its return to [place]. Phone: [phone], Toll-Free: [phone], Fax: [phone] Page: 1 of 1 Call Id: [id number] Will[place] Patient Name: Kurt Aguilar Gender: Unknown DOB: [birth year] Age: 17 Y 28 D Return Phone Number: [phone] (Primary) City/State/Zip: [place] Client [place] Night - Client Client Site [place] [facility] [place], Roque Lias - MD Who Is Calling Patient / Member / Family / Caregiver Call Type Triage / Clinical Relationship To Patient Self Return Phone Number [phone] (Primary) Chief Complaint Medication reaction Reason for Call Symptomatic / Request for [place] said he is out of state , feels like he will pass out but has not yet. Just started using insulin . Is bleeding when bumping into things. Nurse Assessment Guidelines Guideline Title Affirmed Question Disp. Time Eilene Ghazi Time) Disposition Final User 10/21/2016 5:52:41 PM FINAL ATTEMPT MADE - message left Yes Ilona Sorrel, RN, Joellen Jersey

## 2016-10-22 NOTE — Telephone Encounter (Signed)
Pt notified of Dr. Marliss Coots comments and instructions and verbalized understanding. Pt will keep [place] updated on BP and BS

## 2016-10-23 DIAGNOSIS — Z6835 Body mass index (BMI) 35.0-35.9, adult: Secondary | ICD-10-CM | POA: Diagnosis not present

## 2016-10-23 DIAGNOSIS — I10 Essential (primary) hypertension: Secondary | ICD-10-CM | POA: Diagnosis not present

## 2016-10-23 DIAGNOSIS — Z7984 Long term (current) use of oral hypoglycemic drugs: Secondary | ICD-10-CM | POA: Diagnosis not present

## 2016-10-23 DIAGNOSIS — E669 Obesity, unspecified: Secondary | ICD-10-CM | POA: Diagnosis not present

## 2016-10-23 DIAGNOSIS — R531 Weakness: Secondary | ICD-10-CM | POA: Diagnosis not present

## 2016-10-23 DIAGNOSIS — R42 Dizziness and giddiness: Secondary | ICD-10-CM | POA: Diagnosis not present

## 2016-10-23 DIAGNOSIS — E119 Type 2 diabetes mellitus without complications: Secondary | ICD-10-CM | POA: Diagnosis not present

## 2016-10-23 DIAGNOSIS — M6281 Muscle weakness (generalized): Secondary | ICD-10-CM | POA: Diagnosis not present

## 2016-10-23 DIAGNOSIS — Z87891 Personal history of nicotine dependence: Secondary | ICD-10-CM | POA: Diagnosis not present

## 2016-10-23 DIAGNOSIS — T450X5A Adverse effect of antiallergic and antiemetic drugs, initial encounter: Secondary | ICD-10-CM | POA: Diagnosis not present

## 2016-10-29 ENCOUNTER — Other Ambulatory Visit: Payer: Self-pay | Admitting: Internal Medicine

## 2016-10-29 DIAGNOSIS — R918 Other nonspecific abnormal finding of lung field: Secondary | ICD-10-CM

## 2016-11-10 ENCOUNTER — Other Ambulatory Visit: Payer: Self-pay | Admitting: Internal Medicine

## 2016-11-10 DIAGNOSIS — R0600 Dyspnea, unspecified: Secondary | ICD-10-CM

## 2016-11-11 ENCOUNTER — Other Ambulatory Visit (INDEPENDENT_AMBULATORY_CARE_PROVIDER_SITE_OTHER): Payer: PPO

## 2016-11-11 ENCOUNTER — Other Ambulatory Visit: Payer: PPO

## 2016-11-11 ENCOUNTER — Ambulatory Visit (INDEPENDENT_AMBULATORY_CARE_PROVIDER_SITE_OTHER): Payer: PPO | Admitting: Internal Medicine

## 2016-11-11 ENCOUNTER — Encounter: Payer: Self-pay | Admitting: Internal Medicine

## 2016-11-11 VITALS — BP 120/70 | HR 71 | Ht 72.0 in | Wt 258.0 lb

## 2016-11-11 DIAGNOSIS — R0609 Other forms of dyspnea: Secondary | ICD-10-CM

## 2016-11-11 DIAGNOSIS — R05 Cough: Secondary | ICD-10-CM | POA: Diagnosis not present

## 2016-11-11 DIAGNOSIS — R0600 Dyspnea, unspecified: Secondary | ICD-10-CM

## 2016-11-11 DIAGNOSIS — R918 Other nonspecific abnormal finding of lung field: Secondary | ICD-10-CM | POA: Diagnosis not present

## 2016-11-11 DIAGNOSIS — R058 Other specified cough: Secondary | ICD-10-CM

## 2016-11-11 LAB — PULMONARY FUNCTION TEST
DL/VA % PRED: 77 %
DL/VA: 3.63 ml/min/mmHg/L
DLCO COR % PRED: 43 %
DLCO UNC % PRED: 42 %
DLCO UNC: 14.88 ml/min/mmHg
DLCO cor: 15.24 ml/min/mmHg
FEF 25-75 POST: 1.42 L/s
FEF 25-75 PRE: 1 L/s
FEF2575-%CHANGE-POST: 41 %
FEF2575-%PRED-POST: 56 %
FEF2575-%Pred-Pre: 40 %
FEV1-%CHANGE-POST: 7 %
FEV1-%PRED-POST: 56 %
FEV1-%Pred-Pre: 52 %
FEV1-Post: 1.91 L
FEV1-Pre: 1.77 L
FEV1FVC-%CHANGE-POST: 2 %
FEV1FVC-%PRED-PRE: 96 %
FEV6-%Change-Post: 6 %
FEV6-%PRED-POST: 60 %
FEV6-%Pred-Pre: 56 %
FEV6-PRE: 2.48 L
FEV6-Post: 2.63 L
FEV6FVC-%CHANGE-POST: 0 %
FEV6FVC-%PRED-PRE: 104 %
FEV6FVC-%Pred-Post: 105 %
FVC-%Change-Post: 5 %
FVC-%Pred-Post: 57 %
FVC-%Pred-Pre: 54 %
FVC-Post: 2.66 L
FVC-Pre: 2.53 L
POST FEV1/FVC RATIO: 72 %
PRE FEV6/FVC RATIO: 98 %
Post FEV6/FVC ratio: 99 %
Pre FEV1/FVC ratio: 70 %
RV % pred: 76 %
RV: 2.01 L
TLC % pred: 63 %
TLC: 4.71 L

## 2016-11-11 LAB — CBC WITH DIFFERENTIAL/PLATELET
Basophils Absolute: 0 10*3/uL (ref 0.0–0.1)
Basophils Relative: 0.3 % (ref 0.0–3.0)
EOS ABS: 0 10*3/uL (ref 0.0–0.7)
Eosinophils Relative: 0.3 % (ref 0.0–5.0)
HCT: 41.5 % (ref 39.0–52.0)
Hemoglobin: 14 g/dL (ref 13.0–17.0)
LYMPHS ABS: 1.8 10*3/uL (ref 0.7–4.0)
Lymphocytes Relative: 15.8 % (ref 12.0–46.0)
MCHC: 33.8 g/dL (ref 30.0–36.0)
MCV: 92.4 fl (ref 78.0–100.0)
MONO ABS: 0.5 10*3/uL (ref 0.1–1.0)
Monocytes Relative: 4 % (ref 3.0–12.0)
NEUTROS PCT: 79.6 % — AB (ref 43.0–77.0)
Neutro Abs: 9.1 10*3/uL — ABNORMAL HIGH (ref 1.4–7.7)
Platelets: 232 10*3/uL (ref 150.0–400.0)
RBC: 4.48 Mil/uL (ref 4.22–5.81)
RDW: 14.1 % (ref 11.5–15.5)
WBC: 11.4 10*3/uL — ABNORMAL HIGH (ref 4.0–10.5)

## 2016-11-11 LAB — BASIC METABOLIC PANEL
BUN: 34 mg/dL — AB (ref 6–23)
CALCIUM: 9.5 mg/dL (ref 8.4–10.5)
CO2: 24 mEq/L (ref 19–32)
Chloride: 107 mEq/L (ref 96–112)
Creatinine, Ser: 1.68 mg/dL — ABNORMAL HIGH (ref 0.40–1.50)
GFR: 42.77 mL/min — AB (ref >60.00)
Glucose, Bld: 178 mg/dL — ABNORMAL HIGH (ref 70–99)
Potassium: 4.2 mEq/L (ref 3.5–5.1)
SODIUM: 142 meq/L (ref 135–145)

## 2016-11-11 LAB — TSH: TSH: 1.27 u[IU]/mL (ref 0.35–4.50)

## 2016-11-11 LAB — BRAIN NATRIURETIC PEPTIDE: PRO B NATRI PEPTIDE: 24 pg/mL (ref 0.0–100.0)

## 2016-11-11 LAB — SEDIMENTATION RATE: SED RATE: 20 mm/h (ref 0–20)

## 2016-11-11 MED ORDER — PANTOPRAZOLE SODIUM 40 MG PO TBEC
40.0000 mg | DELAYED_RELEASE_TABLET | Freq: Every day | ORAL | 2 refills | Status: DC
Start: 2016-11-11 — End: 2017-02-01

## 2016-11-11 MED ORDER — FAMOTIDINE 20 MG PO TABS: 20.0000 mg | ORAL_TABLET | Freq: Every day | ORAL | 2 refills | Status: DC

## 2016-11-11 NOTE — Patient Instructions (Addendum)
Please remember to go to the lab   department downstairs in the basement  for your tests - we will call you with the results when they are available.      Stop prilosec and take pantoprazole (protonix) 40 mg   Take  30-60 min before first meal of the day and Pepcid (famotidine)  20 mg one @  bedtime until return to office - this is the best way to tell whether stomach acid is contributing to your problem.    Try again to reduce prednisone to 10 mg daily if tolerated   Please schedule a follow up office visit in 4 weeks, sooner if needed

## 2016-11-11 NOTE — Progress Notes (Signed)
Subjective:    Patient ID: Kurt Aguilar, male    DOB: 1946-01-21,   MRN: [id number]    Brief patient profile:  71yowm never cigarette  Smoker(just some cigars)  after cabg in [place] started noting recurrent winter cough regardless of where located in [place] ([place]/ [place]) with typical acute episode in [place] in Feb 2017  This time cough  more dry less severe than previous >  abx and pred > improved but did not resolve and worse when stopped it so restarted pred/abx > dx as pneumonia with variable as dz > referred to pulmonary clinic 12/19/2015 by [name] with 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 Pulmonary Consult    Referred by [name]. Pt c/o cough x 4 wks- non prod and worse in the evening and when he lies down. Talking and exertion are things that trigger the cough. He has been on pred x 2 and both times cough resolved and immediately returns once done with med. He also c/o SOB- gets winded just walking from room to room at home.   while on prednisone still some weak and sob but better cough and last dose at least one week and worse overall since off it assoc with sob room to room and weakness/ excess/ purulent sputum or mucus plugs / no unusual exp / no ctd.  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 Follow-up    Cough and SOB have improved some, but not back to his normal baseline. He states still feeling very weak.   no longer needing cough med, walking down block and back slower than  baseline rec No change rx   02/01/2016  f/u ov/Wert re: boop tapered pred to 10 mg as 01/28/16  Chief Complaint  Patient presents with  . Follow-up    pt states he is much improved since last office visit.  SOB has resolved.    Not limited by breathing from desired activities / concerned about wt gain from pred  rec Prednisone 10 mg with bfast x 2 week then 5 mg x 2 weeks then 5 mg even days x 2 weeks and stop  If breathing/cough worse or nausea resume previous dose     05/16/2016  f/u ov/Wert re: boop off pred since early July 2017  Chief Complaint  Patient presents with  . Follow-up    CXR repeated today. Breathing is doing well. No new co's today.   Not limited by breathing from desired activities   rec Please remember to go to the lab   department downstairs for your tests - we will call you with the results when they are available. Weight control iby neg cal balance   06/03/16  PC sick with cough/ sense of chest congestion/ nothing purulent x 3 days and hoarseness  rec pred 20 until better   06/17/2016  f/u ov/Wert re:  prob boop recurrence >> Prednisone down to 10 mg  x 3 days  Chief Complaint  Patient presents with  . Follow-up    Had increased cough 2 wks ago- started back on pred 20 mg and stayed on this dose for 9 days, then started on 10 mg daily. He states cough had improved on 20 mg and started worsening again on 10 mg. His cough is non prod.    cough is daytime and dry overall much better   Not limited by breathing from desired activities   rec Prednisone 10 mg 2 daily until better, then 1 daily for one week,  Then half daily for a week then stop  If flare start over at 2 daily (call if need more)  Whenever needing prednisone or coughing or hoarse > omeprazole(prilosec) is 20 mg Take 30- 60 min before your first and last meals of the day  For cough > take tessalon up to every 4 hours as needed    07/29/2016  f/u ov/Wert re: ? boop recurrence  Chief Complaint    Patient presents with  . Follow-up    6wk rov. pt states breathing has slightly improved. pt sob with exertion, chest congestion & occ wheezing.  while on omeprazole bid ac and predisone down to 5 mg x 8 days cough worse so started back on 20 mg daily and helped still feel urge to clear the throat daytime only .   Walked all over [place] on 20 mg daily  rec Start gabapentin 100 mg three times a day  For throat clearing use tessalon pearls as needed and keeps lots of candy handy= sugarless life savers and jolly ranchers Taper prednisone to the lowest dose the works - try taper to 10 alternate with 5 mg to get thru  the holidays      09/11/2016  f/u ov/Wert re: BOOP/uacs  on pred 10/5  prilosec 20 mg ac and hs  Chief Complaint  Patient presents with  . Follow-up    Increased SOB for the past 3-4 days. He gets SOB with exertion such as getting dressed or walking accross the room.  He has also started coughing more "feels like I burned my lungs"- prod with white sputum.     was doing ok on 10/5 in terms of breathing but worse gradually x one week p several weeks on the 10/5 dose  Cough some better but still needing freq tessalon on gabapentin 100 tid  rec Prednisone 10 mg  2 daily until better then 1 daily x 2 weeks then 10 alternating with 5 until return Prilosec should be 20 mg Take 30- 60 min before your first and last meals of the day  Increase gabapentin to 300 mg three times daily   .        11/11/2016  f/u ov/Wert re: BOOP/ pred 15 a/w 10  Chief Complaint  Patient presents with  . Follow-up    Cough has improved per pt but his spouse thinks it's worse. Pt states his breathing is progressively worse. He has not had to use ventolin inhaler. He is currently taking pred 15 alternating with 10.    cough remains dry day > noct   No obvious day to day or daytime variability or assoc excess/ purulent sputum or mucus plugs or hemoptysis or cp or chest tightness, subjective wheeze or overt  sinus or hb symptoms. No unusual exp hx or h/o childhood pna/ asthma or knowledge of premature birth.  Sleeping ok without nocturnal  or early am exacerbation  of  respiratory  c/o's or need for noct saba. Also denies any obvious fluctuation of symptoms with weather or environmental changes or other aggravating or alleviating factors except as outlined above   Current Medications, Allergies, Complete Past Medical History, Past Surgical History, Family History, and Social History were reviewed in [employer] record.  ROS  The following are not active complaints unless bolded sore throat, dysphagia, dental problems, itching, sneezing,  nasal congestion or excess/ purulent secretions, ear ache,   fever, chills, sweats, unintended wt loss, classically pleuritic or exertional cp,  orthopnea pnd or leg swelling, presyncope, palpitations, abdominal pain, anorexia, nausea, vomiting, diarrhea  or change in bowel or bladder habits, change in stools or urine, dysuria,hematuria,  rash, arthralgias baseline , visual complaints, headache, numbness, weakness or ataxia or problems with walking or coordination,  change in mood/affect or memory.                 Objective:   Physical Exam  Pleasant min hoarse amb wm freq  throat clearng    11/11/2016          258  09/11/2016          252  07/29/2016      244  05/16/2016          238  02/01/2016        235  01/07/2016          228   12/19/15 229 lb 4 oz (103.987 kg)  12/19/15 229 lb 4 oz (103.987 kg)  12/19/15 227 lb (102.967 kg)    Vital signs reviewed/  Note sats 95% RA on arrival  HEENT: nl dentition, turbinates, and oropharynx. Nl external ear canals without cough reflex   NECK :  without JVD/Nodes/TM/ nl carotid upstrokes bilaterally   LUNGS: no acc muscle use,  Nl contour chest  Min distant  insp crackles in bases with  cough on insp maneuvers   CV:  RRR  no s3 or murmur or increase in P2,  Trace to 1 + sym pitting bilateral  lower ext edema   ABD:  soft and nontender with nl inspiratory excursion in the supine position. No bruits or organomegaly, bowel sounds nl  MS:  Nl gait/ ext warm without deformities, calf tenderness, cyanosis or clubbing No obvious joint restrictions   SKIN: warm and dry without lesions    NEURO:  alert, approp, nl sensorium with  no motor deficits      CXR PA and Lateral:   09/11/2016 :    I personally reviewed images and agree with radiology impression as follows:    No active lung disease.  Stable borderline cardiomegaly.     Labs ordered/ reviewed:      Chemistry      Component Value Date/Time   NA 142 11/11/2016 1048   K 4.2 11/11/2016 1048   CL 107 11/11/2016 1048   CO2 24 11/11/2016 1048   BUN 34 (H) 11/11/2016 1048   CREATININE 1.68 (H) 11/11/2016 1048      Component Value Date/Time   CALCIUM 9.5 11/11/2016 1048   ALKPHOS 45 02/06/2016 1002   AST 27 02/06/2016 1002   ALT 27 02/06/2016 1002   BILITOT 1.0 02/06/2016 1002        Lab Results  Component Value Date   WBC 11.4 (H) 11/11/2016   HGB 14.0 11/11/2016   HCT 41.5 11/11/2016   MCV 92.4 11/11/2016   PLT 232.0 11/11/2016  Lab Results  Component Value Date   TSH 1.27 11/11/2016     Lab Results  Component Value Date   PROBNP 24.0 11/11/2016       Lab Results  Component Value Date   ESRSEDRATE 20 11/11/2016   ESRSEDRATE 27 (H) 09/11/2016   ESRSEDRATE 58 (H) 05/16/2016                 Assessment & Plan:

## 2016-11-11 NOTE — Progress Notes (Signed)
PFT done today. 

## 2016-11-12 ENCOUNTER — Telehealth: Payer: Self-pay | Admitting: Family Medicine

## 2016-11-12 ENCOUNTER — Ambulatory Visit (INDEPENDENT_AMBULATORY_CARE_PROVIDER_SITE_OTHER): Payer: PPO | Admitting: Family Medicine

## 2016-11-12 VITALS — BP 128/74 | HR 66 | Temp 97.8°F | Ht 70.5 in | Wt 258.5 lb

## 2016-11-12 DIAGNOSIS — E1165 Type 2 diabetes mellitus with hyperglycemia: Secondary | ICD-10-CM

## 2016-11-12 DIAGNOSIS — I10 Essential (primary) hypertension: Secondary | ICD-10-CM

## 2016-11-12 DIAGNOSIS — IMO0001 Reserved for inherently not codable concepts without codable children: Secondary | ICD-10-CM

## 2016-11-12 DIAGNOSIS — I959 Hypotension, unspecified: Secondary | ICD-10-CM

## 2016-11-12 NOTE — Progress Notes (Signed)
Pre visit review using our clinic review tool, if applicable. No additional management support is needed unless otherwise documented below in the visit note. 

## 2016-11-12 NOTE — Assessment & Plan Note (Addendum)
Trial of gabapentin 100 tid 07/29/2016 >  Still tessalon dep 09/11/2016 so increased to 300 tid and ppi bid ac > not improved 11/11/2016   Next step is usually add elavil at hs but due to wt gain he's already experiencing this is not a good option > will try  Max acid suppression/ hard rock sugarless candy instead.

## 2016-11-12 NOTE — Telephone Encounter (Signed)
Pt called stating he called his insurance  co about diabetic classes.  He stated it sounded like they will cover this however whoever we send him to will have to call to clarify.  Pt is ok with going [facility] if that who you prefer.

## 2016-11-12 NOTE — Progress Notes (Signed)
Subjective:    Patient ID: Kurt Aguilar, male    DOB: May 08, 1946, 71 y.o.   MRN: [id number]  HPI Here for f/u of chronic health problems   Wt Readings from Last 3 Encounters:  11/12/16 258 lb 8 oz (117.3 kg)  11/11/16 258 lb (117 kg)  09/12/16 252 lb (114.3 kg)   bmi 36.5  He had trouble with bp and glucose control while he was in [place]  bp trended down (92/62) and he stopped clorthalidone Glucose went up into the 400s Was on lantus 30 u at night   Continues to hold the chlorthalidone BP Readings from Last 3 Encounters:  11/12/16 128/74  11/11/16 120/70  09/12/16 136/86  at home it is running   Pulse Readings from Last 3 Encounters:  11/12/16 66  11/11/16 71  09/12/16 61    Was inst to inc lantus to 40 u daily (before lunch)   Blood glucose level readings from home are labile [place] from 80s to 200s for the most part  On 2/23 he had a reading of 375 -had peach pie and french fries  On 2/27 a 332 ate soup with cheese on the top and creme brulee   Not much exercise in [place] because he stayed dizzy   Diet - ate a lot of fish  He is not totally off sweets   He sucks on sugar free hard candy for cough   Higher readings are usually mid day   On 3/3 a 306  Prednisone dose is currently 10 mg daily   (was prev on 10 and 15 mg alternating)   On glipizide 10 mg xl daily  Lab Results  Component Value Date   HGBA1C 9.2 (H) 07/29/2016     He went to the ED on 2/15 in [facility] for dizziness  ? If related to blood pressure  Improved since better bp off chlorthalidone  Pulse is ok  Does not think it is from gabapentin   Did water aerobics this am   Saw Dr [name]- diag also with obesity hypoventilation syndrome   Patient Active Problem List   Diagnosis Date Noted  . Dyspnea on exertion 11/11/2016  . Diabetes mellitus type 2, uncontrolled ([place]) 08/13/2016  . Chronic heel pain, left 08/12/2016  . Upper airway cough syndrome 07/31/2016  . Morbid obesity due to excess  calories ([place]) in setting of pred rx  05/19/2016  . Routine general medical examination at a health care facility 01/27/2016  . Pulmonary infiltrates 12/19/2015  . Fatigue 12/12/2015  . Hypotension 12/12/2015  . Cough 11/29/2015  . Cystic kidney disease 10/01/2015  . Renal lesion 09/27/2015  . Erectile dysfunction of organic origin 09/27/2015  . BPH with obstruction/lower urinary tract symptoms 09/27/2015  . Constipation 09/24/2015  . Blood in semen 09/24/2015  . Cyst of right kidney 09/24/2015  . Leukocytosis 09/24/2015  . CAD (coronary artery disease) 06/08/2015  . Grieving 11/22/2014  . Encounter for Medicare annual wellness exam 07/11/2013  . Prostate cancer screening 07/03/2013  . Right bundle branch block 02/22/2013  . Chronic low back pain 05/13/2011  . Obstructive sleep apnea 03/15/2010  . Hyperlipidemia 04/12/2009  . Gout 04/12/2009  . Essential hypertension 04/12/2009  . CAD (coronary artery disease) of artery bypass graft 04/12/2009  . GERD 04/12/2009  . Renal insufficiency 04/12/2009  . DIVERTICULITIS, HX OF 04/12/2009   Past Medical History:  Diagnosis Date  . Arthritis   . Atypical migraine   . Diabetes ([place])   .  Diverticulitis   . GERD (gastroesophageal reflux disease)   . Gout   . Heart attack   . Heart disease   . Hyperglycemia   . Hyperlipidemia   . Hypertension   . Osteoporosis   . Sleep apnea    Past Surgical History:  Procedure Laterality Date  . CATARACT EXTRACTION Bilateral   . CORONARY ARTERY BYPASS GRAFT     [place]  . open heart surgery  09/09/1995-1998   5 bypass  . REPAIR KNEE LIGAMENT     right  . TONSILLECTOMY     Social History  Substance Use Topics  . Smoking status: Former Smoker    Packs/day: 1.00    Years: 3.00    Types: Cigars    Quit date: 11/25/2004  . Smokeless tobacco: Never Used     Comment: occas. cigar quit 2006  . Alcohol use 0.6 oz/week    1 Standard drinks or equivalent per week     Comment: rare     Family History  Problem Relation Age of Onset  . Heart disease Mother   . Heart disease Father   . Stroke Father   . Heart attack Son 50  . Kidney disease Neg Hx   . Prostate cancer Neg Hx    Allergies  Allergen Reactions  . Oxycodone-Acetaminophen     Stops breathing   Current Outpatient Prescriptions on File Prior to Visit  Medication Sig Dispense Refill  . albuterol (VENTOLIN HFA) 108 (90 Base) MCG/ACT inhaler Inhale 2 puffs into the lungs every 4 (four) hours as needed for wheezing or shortness of breath (tight lungs ). 1 Inhaler prn  . allopurinol (ZYLOPRIM) 100 MG tablet take 1 tablet by mouth twice a day (Patient taking differently: take 1 tablet by mouth twice a day with a meal) 180 tablet 1  . aspirin 81 MG tablet Reported on 12/19/2015    . benzonatate (TESSALON) 200 MG capsule take 1 capsule by mouth three times a day if needed for cough 60 capsule 0  . colchicine 0.6 MG tablet Take 1 tablet (0.6 mg total) by mouth 2 (two) times daily as needed. With a meal as needed for acute flare of gout 45 tablet 1  . ezetimibe (ZETIA) 10 MG tablet take 1 tablet by mouth once daily 90 tablet 1  . famotidine (PEPCID) 20 MG tablet Take 1 tablet (20 mg total) by mouth at bedtime. 30 tablet 2  . furosemide (LASIX) 20 MG tablet Take 1 tablet (20 mg total) by mouth daily as needed. Reported on 12/19/2015 30 tablet 3  . gabapentin (NEURONTIN) 300 MG capsule Take 1 capsule (300 mg total) by mouth 3 (three) times daily. 90 capsule 11  . glipiZIDE (GLUCOTROL XL) 10 MG 24 hr tablet Take 1 tablet (10 mg total) by mouth daily with breakfast. 30 tablet 11  . Glucosamine HCl (GLUCOSAMINE PO) Take 1,500 mg by mouth 2 (two) times daily. Reported on 12/19/2015    . glucose blood (PRODIGY NO CODING BLOOD GLUC) test strip Check blood sugar 4 times a day and as directed. Dx E11.65 300 each 5  . Insulin Glargine (LANTUS) 100 UNIT/ML Solostar Pen Inject 50 Units into the skin daily at 10 pm. (Patient taking  differently: Inject 40 Units into the skin daily at 10 pm. ) 45 mL 3  . Insulin Pen Needle 31G X 8 MM MISC Use with Lantus insulin pen 50 each 2  . Lutein 20 MG CAPS Take 1 capsule by mouth  daily. Reported on 12/19/2015    . metoprolol tartrate (LOPRESSOR) 25 MG tablet take 1 tablet by mouth twice a day 180 tablet 0  . NONFORMULARY OR COMPOUNDED ITEM Apply 1-2 g topically 4 (four) times daily. 120 each 2  . pantoprazole (PROTONIX) 40 MG tablet Take 1 tablet (40 mg total) by mouth daily before breakfast. 30 tablet 2  . polycarbophil (FIBERCON) 625 MG tablet Take 625 mg by mouth daily. Reported on 12/19/2015    . potassium chloride SA (K-DUR,KLOR-CON) 20 MEQ tablet take 1 tablet by mouth once daily (Patient taking differently: take 1/2 tablet by mouth once daily) 90 tablet 0  . predniSONE (DELTASONE) 10 MG tablet TAKE 20mg  DAILY UNTIL BETTER, THEN 10mg  DAILY x 2 weeks then one alternating with one half 100 tablet 2  . PRODIGY LANCETS 26G MISC 1 each by Does not apply route daily. To check glucose qd and prn for DM2 (250.00) 100 each 3  . rosuvastatin (CRESTOR) 40 MG tablet take 1 tablet by mouth once daily 90 tablet 1  . tadalafil (CIALIS) 20 MG tablet Take 1 tablet (20 mg total) by mouth daily as needed for erectile dysfunction. 10 tablet 12   No current facility-administered medications on file prior to visit.      Review of Systems Review of Systems  Constitutional: Negative for fever, appetite change, and unexpected weight change. pos for fatigue  Eyes: Negative for pain and visual disturbance.  Respiratory: Negative for cough and pos for exertional shortness of breath.  pos for general exercise intolerance  Cardiovascular: Negative for cp or palpitations    Gastrointestinal: Negative for nausea, diarrhea and constipation.  Genitourinary: Negative for urgency and frequency.  Skin: Negative for pallor or rash   Neurological: Negative for weakness, light-headedness, numbness and headaches.    Hematological: Negative for adenopathy. Does not bruise/bleed easily.  Psychiatric/Behavioral: Negative for dysphoric mood. The patient is occ  nervous/anxious.         Objective:   Physical Exam  Constitutional: He appears well-developed and well-nourished. No distress.  obese and well appearing   HENT:  Head: Normocephalic and atraumatic.  Mouth/Throat: Oropharynx is clear and moist.  Eyes: Conjunctivae and EOM are normal. Pupils are equal, round, and reactive to light.  Neck: Normal range of motion. Neck supple. No JVD present. Carotid bruit is not present. No thyromegaly present.  Cardiovascular: Normal rate, regular rhythm, normal heart sounds and intact distal pulses.  Exam reveals no gallop.   Pulmonary/Chest: Effort normal and breath sounds normal. No respiratory distress. He has no wheezes. He has no rales.  No crackles  Good air exch No wheezing   Abdominal: Soft. Bowel sounds are normal. He exhibits no distension, no abdominal bruit and no mass. There is no tenderness.  Musculoskeletal: He exhibits no edema.  Lymphadenopathy:    He has no cervical adenopathy.  Neurological: He is alert. He has normal reflexes. No cranial nerve deficit. He exhibits normal muscle tone. Coordination normal.  Skin: Skin is warm and dry. No rash noted. No pallor.  Psychiatric: He has a normal mood and affect.          Assessment & Plan:   Problem List Items Addressed This Visit      Cardiovascular and Mediastinum   Essential hypertension (Chronic)    bp is ok off chlorthalidone bp in fair control at this time  BP Readings from Last 1 Encounters:  11/12/16 128/74   No changes needed Disc lifstyle change with low  sodium diet and exercise         Hypotension    This is much improved off chlorthalidone  He is less dizzy  Will continue to hold it and we will monitor        Endocrine   Diabetes mellitus type 2, uncontrolled (HCC) - Primary    Glucose readings were high -  some imp after inc lantus to 40 u daily  Disc diet in detail-pt has very poor understanding despite dm teaching in the past  Is cutting prednisone down a bit which should help-but unsure if he will get off of it in the future Wt is also up  Ref to DM teaching  Ref to endocrinology for help with management Lab Results  Component Value Date   HGBA1C 9.2 (H) 07/29/2016   Rev diet- and urged to do better with low refined carb diet and exercise as tolerated       Relevant Orders   Ambulatory referral to Endocrinology

## 2016-11-12 NOTE — Patient Instructions (Addendum)
Call your insurance to see how coverage would be for diabetic education  I would love to set you up at [facility] in the future   Stop at check out for referral to endocrinology

## 2016-11-12 NOTE — Progress Notes (Signed)
Spoke with pt and notified of results per Dr. Wert. Pt verbalized understanding and denied any questions. 

## 2016-11-12 NOTE — Telephone Encounter (Signed)
[place] form with [name].

## 2016-11-12 NOTE — Assessment & Plan Note (Signed)
No evidence of anemia/ chf/ thyroid dz or airflow obst on today's studies

## 2016-11-12 NOTE — Assessment & Plan Note (Signed)
Body mass index is 34.99 kg/m.  trending  Up on pred Lab Results  Component Value Date   TSH 1.27 11/11/2016     Contributing to gerd/ Doe/low ERV (discussed) >> reviewed the need and the process to achieve and maintain neg calorie balance > defer f/u primary care including intermittently monitoring thyroid status

## 2016-11-12 NOTE — Telephone Encounter (Signed)
Ref done  Will route to [facility] 

## 2016-11-12 NOTE — Assessment & Plan Note (Addendum)
Baseline PFTs 03/18/10  FVC  3.59 (80%) with ERV 67% and dlco 75 corrects to 106 Onset of symptoms feb 2017 with "photo neg pumonary edema pattern" - Allergy profile 12/19/15 >  Eos 0.2 /  IgE  89/ Pos Cat RAST only  12/19/2015  ESR 93 > started on prednisone 20 mg daily  HSP profile 12/19/15 >  Neg  - 01/07/2016 ESR down to 18 but only 30% improved symptomatically > rec continue 20 mg daily until better then 10 mg daily  - 02/01/2016 rec taper off x 6 weeks then ov > 05/16/2016 ESR = 58> restarted pred 06/03/16  - Collagen Vasc dz screen 07/29/2016  Neg  - PFT's  11/11/2016  FEV1 1.91 (56 % ) ratio 72  p 7 % improvement from saba p nothing prior to study with DLCO  42/43 % corrects to 77 % for alv volume but ERV 35%  - ESR  20 on 10 a/w 15 as  Of 11/11/2016 > rec try 10 mg daily   The goal with a chronic steroid dependent illness is always arriving at the lowest effective dose that controls the disease/symptoms and not accepting a set "formula" which is based on statistics or guidelines that don't always take into account patient  variability or the natural hx of the dz in every individual patient, which may well vary over time.  For now therefore I recommend the patient maintain  10 mg daily if possible.

## 2016-11-13 NOTE — Assessment & Plan Note (Addendum)
This is much improved off chlorthalidone  He is less dizzy  Will continue to hold it and we will monitor

## 2016-11-13 NOTE — Assessment & Plan Note (Signed)
Glucose readings were high - some imp after inc lantus to 40 u daily  Disc diet in detail-pt has very poor understanding despite dm teaching in the past  Is cutting prednisone down a bit which should help-but unsure if he will get off of it in the future Wt is also up  Ref to DM teaching  Ref to endocrinology for help with management Lab Results  Component Value Date   HGBA1C 9.2 (H) 07/29/2016   Rev diet- and urged to do better with low refined carb diet and exercise as tolerated

## 2016-11-13 NOTE — Assessment & Plan Note (Signed)
bp is ok off chlorthalidone bp in fair control at this time  BP Readings from Last 1 Encounters:  11/12/16 128/74   No changes needed Disc lifstyle change with low sodium diet and exercise

## 2016-11-27 DIAGNOSIS — G4733 Obstructive sleep apnea (adult) (pediatric): Secondary | ICD-10-CM | POA: Diagnosis not present

## 2016-11-28 DIAGNOSIS — E1165 Type 2 diabetes mellitus with hyperglycemia: Secondary | ICD-10-CM | POA: Diagnosis not present

## 2016-12-03 ENCOUNTER — Encounter: Payer: Self-pay | Admitting: Endocrinology

## 2016-12-03 ENCOUNTER — Ambulatory Visit (INDEPENDENT_AMBULATORY_CARE_PROVIDER_SITE_OTHER): Payer: PPO | Admitting: Endocrinology

## 2016-12-03 VITALS — BP 126/84 | HR 76 | Ht 70.5 in | Wt 254.0 lb

## 2016-12-03 DIAGNOSIS — E1165 Type 2 diabetes mellitus with hyperglycemia: Secondary | ICD-10-CM

## 2016-12-03 DIAGNOSIS — IMO0001 Reserved for inherently not codable concepts without codable children: Secondary | ICD-10-CM

## 2016-12-03 LAB — POCT GLYCOSYLATED HEMOGLOBIN (HGB A1C): Hemoglobin A1C: 6

## 2016-12-03 NOTE — Progress Notes (Signed)
Subjective:    Patient ID: Kurt Aguilar, male    DOB: [birth year], 71 y.o.   MRN: [id number]  HPI pt is referred by [name], for diabetes.  Pt states DM was dx'ed in 2014; he has moderate neuropathy of the lower extremities; he has associated renal insufficiency and CAD; he has been on insulin since early 2018; pt says his diet and exercise are limited by health problems; he has never had pancreatitis, pancreatic surgery, severe hypoglycemia or DKA.  He takes chronic prednisone for BOOP (recently tapered).  He brings a record of his cbg's which I have reviewed today.  It varies from 71-235, but most are approx 100.   Past Medical History:  Diagnosis Date  . Arthritis   . Atypical migraine   . Diabetes ([place])   . Diverticulitis   . GERD (gastroesophageal reflux disease)   . Gout   . Heart attack   . Heart disease   . Hyperglycemia   . Hyperlipidemia   . Hypertension   . Osteoporosis   . Sleep apnea     Past Surgical History:  Procedure Laterality Date  . CATARACT EXTRACTION Bilateral   . CORONARY ARTERY BYPASS GRAFT     [place]  . open heart surgery  09/09/1995-1998   5 bypass  . REPAIR KNEE LIGAMENT     right  . TONSILLECTOMY      Social History   Social History  . Marital status: Married    Spouse name: N/A  . Number of children: 0  . Years of education: N/A   Occupational History  . Restaurant owner Retired   Social History Main Topics  . Smoking status: Former Smoker    Packs/day: 1.00    Years: 3.00    Types: Cigars    Quit date: 11/25/2004  . Smokeless tobacco: Never Used     Comment: occas. cigar quit 2006  . Alcohol use 0.6 oz/week    1 Standard drinks or equivalent per week     Comment: rare  . Drug use: No  . Sexual activity: No   Other Topics Concern  . Not on file   Social History Narrative  . No narrative on file    Current Outpatient Prescriptions on File Prior to Visit  Medication Sig Dispense Refill  . albuterol  (VENTOLIN HFA) 108 (90 Base) MCG/ACT inhaler Inhale 2 puffs into the lungs every 4 (four) hours as needed for wheezing or shortness of breath (tight lungs ). 1 Inhaler prn  . allopurinol (ZYLOPRIM) 100 MG tablet take 1 tablet by mouth twice a day (Patient taking differently: take 1 tablet by mouth twice a day with a meal) 180 tablet 1  . aspirin 81 MG tablet 81 mg 2 (two) times daily. Reported on 12/19/2015    . CINNAMON PO Take 1,000 mg by mouth 2 (two) times daily.    . colchicine 0.6 MG tablet Take 1 tablet (0.6 mg total) by mouth 2 (two) times daily as needed. With a meal as needed for acute flare of gout 45 tablet 1  . ezetimibe (ZETIA) 10 MG tablet take 1 tablet by mouth once daily 90 tablet 1  . famotidine (PEPCID) 20 MG tablet Take 1 tablet (20 mg total) by mouth at bedtime. 30 tablet 2  . furosemide (LASIX) 20 MG tablet Take 1 tablet (20 mg total) by mouth daily as needed. Reported on 12/19/2015 30 tablet 3  . gabapentin (NEURONTIN) 300 MG capsule Take 1 capsule (  300 mg total) by mouth 3 (three) times daily. 90 capsule 11  . Glucosamine HCl (GLUCOSAMINE PO) Take 1,500 mg by mouth 2 (two) times daily. Reported on 12/19/2015    . glucose blood (PRODIGY NO CODING BLOOD GLUC) test strip Check blood sugar 4 times a day and as directed. Dx E11.65 300 each 5  . Insulin Glargine (LANTUS) 100 UNIT/ML Solostar Pen Inject 50 Units into the skin daily at 10 pm. (Patient taking differently: Inject 40 Units into the skin daily at 10 pm. ) 45 mL 3  . Insulin Pen Needle 31G X 8 MM MISC Use with Lantus insulin pen 50 each 2  . Lutein 20 MG CAPS Take 1 capsule by mouth daily. Reported on 12/19/2015    . metoprolol tartrate (LOPRESSOR) 25 MG tablet take 1 tablet by mouth twice a day 180 tablet 0  . NONFORMULARY OR COMPOUNDED ITEM Apply 1-2 g topically 4 (four) times daily. 120 each 2  . pantoprazole (PROTONIX) 40 MG tablet Take 1 tablet (40 mg total) by mouth daily before breakfast. 30 tablet 2  . polycarbophil  (FIBERCON) 625 MG tablet Take 625 mg by mouth daily. Reported on 12/19/2015    . potassium chloride SA (K-DUR,KLOR-CON) 20 MEQ tablet take 1 tablet by mouth once daily (Patient taking differently: take 1/2 tablet by mouth once daily) 90 tablet 0  . PRODIGY LANCETS 26G MISC 1 each by Does not apply route daily. To check glucose qd and prn for DM2 (250.00) 100 each 3  . rosuvastatin (CRESTOR) 40 MG tablet take 1 tablet by mouth once daily 90 tablet 1  . tadalafil (CIALIS) 20 MG tablet Take 1 tablet (20 mg total) by mouth daily as needed for erectile dysfunction. 10 tablet 12   No current facility-administered medications on file prior to visit.     Allergies  Allergen Reactions  . Oxycodone-Acetaminophen     Stops breathing    Family History  Problem Relation Age of Onset  . Heart disease Mother   . Heart disease Father   . Stroke Father   . Heart attack Son 13  . Kidney disease Neg Hx   . Prostate cancer Neg Hx   . Diabetes Neg Hx     BP 126/84   Pulse 76   Ht 5' 10.5" (1.791 m)   Wt 254 lb (115.2 kg)   SpO2 94%   BMI 35.93 kg/m    Review of Systems denies blurry vision, headache, chest pain, n/v, urinary frequency, excessive diaphoresis, and rhinorrhea.  He has chronic doe, leg cramps, easy bruising, cold intolerance, and weight gain.      Objective:   Physical Exam VS: see vs page GEN: no distress HEAD: head: no deformity eyes: no periorbital swelling, no proptosis external nose and ears are normal mouth: no lesion seen NECK: supple, thyroid is not enlarged CHEST WALL: no deformity.  Old healed surgical scar (median sternotomy). LUNGS: clear to auscultation CV: reg rate and rhythm, no murmur ABD: abdomen is soft, nontender.  no hepatosplenomegaly.  not distended.  no hernia MUSCULOSKELETAL: muscle bulk and strength are grossly normal.  no obvious joint swelling.  gait is normal and steady. EXTEMITIES: no deformity.  no ulcer on the feet.  feet are of normal color  and temp.  1+ bilat edema.  Old healed surgical scars on both legs (vein harvest).  PULSES: dorsalis pedis intact bilat.  no carotid bruit NEURO:  cn 2-12 grossly intact.   readily moves all 4's.  sensation is intact to touch on the feet SKIN:  Normal texture and temperature.  No rash or suspicious lesion is visible.   NODES:  None palpable at the neck PSYCH: alert, well-oriented.  Does not appear anxious nor depressed.    a1c=6.0%  Lab Results  Component Value Date   CREATININE 1.68 (H) 11/11/2016   BUN 34 (H) 11/11/2016   NA 142 11/11/2016   K 4.2 11/11/2016   CL 107 11/11/2016   CO2 24 11/11/2016   I personally reviewed electrocardiogram tracing (06/16/16): Indication: HTN Impression: NSR.  No MI.  No hypertrophy.  RBBB Compared to 12/19/15: no change    Assessment & Plan:  Insulin-requiring type 2 DM, with CAD: overcontrolled.   BOOP: prednisone has been tapered, but he will be on for the forseable future.  Renal insuff: in this context, glipizide is chosen as the med to d/c.   Patient Instructions  good diet and exercise significantly improve the control of your diabetes.  please let me know if you wish to be referred to a dietician.  high blood sugar is very risky to your health.  you should see an eye doctor and dentist every year.  It is very important to get all recommended vaccinations.  Controlling your blood pressure and cholesterol drastically reduces the damage diabetes does to your body.  Those who smoke should quit.  Please discuss these with your doctor.  check your blood sugar twice a day.  vary the time of day when you check, between before the 3 meals, and at bedtime.  also check if you have symptoms of your blood sugar being too high or too low.  please keep a record of the readings and bring it to your next appointment here (or you can bring the meter itself).  You can write it on any piece of paper.  please call us sooner if you have a lot of readings under 1000 or  over 200. For now, please: Stop taking the glipizide, and: Change the lantus to the morning.   Please come back for a follow-up appointment in 1 month.

## 2016-12-03 NOTE — Patient Instructions (Addendum)
good diet and exercise significantly improve the control of your diabetes.  please let me know if you wish to be referred to a dietician.  high blood sugar is very risky to your health.  you should see an eye doctor and dentist every year.  It is very important to get all recommended vaccinations.  Controlling your blood pressure and cholesterol drastically reduces the damage diabetes does to your body.  Those who smoke should quit.  Please discuss these with your doctor.  check your blood sugar twice a day.  vary the time of day when you check, between before the 3 meals, and at bedtime.  also check if you have symptoms of your blood sugar being too high or too low.  please keep a record of the readings and bring it to your next appointment here (or you can bring the meter itself).  You can write it on any piece of paper.  please call us sooner if you have a lot of readings under 1000 or over 200. For now, please: Stop taking the glipizide, and: Change the lantus to the morning.   Please come back for a follow-up appointment in 1 month.

## 2016-12-11 ENCOUNTER — Other Ambulatory Visit (INDEPENDENT_AMBULATORY_CARE_PROVIDER_SITE_OTHER): Payer: PPO

## 2016-12-11 DIAGNOSIS — E1165 Type 2 diabetes mellitus with hyperglycemia: Secondary | ICD-10-CM | POA: Diagnosis not present

## 2016-12-11 DIAGNOSIS — IMO0001 Reserved for inherently not codable concepts without codable children: Secondary | ICD-10-CM

## 2016-12-11 DIAGNOSIS — I10 Essential (primary) hypertension: Secondary | ICD-10-CM

## 2016-12-11 LAB — COMPREHENSIVE METABOLIC PANEL
ALBUMIN: 4.1 g/dL (ref 3.5–5.2)
ALT: 22 U/L (ref 0–53)
AST: 19 U/L (ref 0–37)
Alkaline Phosphatase: 32 U/L — ABNORMAL LOW (ref 39–117)
BUN: 30 mg/dL — ABNORMAL HIGH (ref 6–23)
CALCIUM: 9.4 mg/dL (ref 8.4–10.5)
CHLORIDE: 108 meq/L (ref 96–112)
CO2: 27 meq/L (ref 19–32)
Creatinine, Ser: 1.44 mg/dL (ref 0.40–1.50)
GFR: 51.08 mL/min — AB (ref >60.00)
Glucose, Bld: 83 mg/dL (ref 70–99)
Potassium: 3.9 mEq/L (ref 3.5–5.1)
Sodium: 142 mEq/L (ref 135–145)
Total Bilirubin: 0.7 mg/dL (ref 0.2–1.2)
Total Protein: 6.5 g/dL (ref 6.0–8.3)

## 2016-12-11 LAB — HEMOGLOBIN A1C: HEMOGLOBIN A1C: 6.2 % (ref 4.6–6.5)

## 2016-12-15 DIAGNOSIS — E1165 Type 2 diabetes mellitus with hyperglycemia: Secondary | ICD-10-CM | POA: Diagnosis not present

## 2016-12-16 ENCOUNTER — Ambulatory Visit (INDEPENDENT_AMBULATORY_CARE_PROVIDER_SITE_OTHER): Payer: PPO | Admitting: Family Medicine

## 2016-12-16 ENCOUNTER — Encounter: Payer: Self-pay | Admitting: Family Medicine

## 2016-12-16 VITALS — BP 110/68 | HR 60 | Temp 98.0°F | Ht 70.5 in | Wt 251.5 lb

## 2016-12-16 DIAGNOSIS — N289 Disorder of kidney and ureter, unspecified: Secondary | ICD-10-CM

## 2016-12-16 DIAGNOSIS — E1165 Type 2 diabetes mellitus with hyperglycemia: Secondary | ICD-10-CM

## 2016-12-16 DIAGNOSIS — R0609 Other forms of dyspnea: Secondary | ICD-10-CM

## 2016-12-16 DIAGNOSIS — IMO0001 Reserved for inherently not codable concepts without codable children: Secondary | ICD-10-CM

## 2016-12-16 DIAGNOSIS — I10 Essential (primary) hypertension: Secondary | ICD-10-CM

## 2016-12-16 NOTE — Assessment & Plan Note (Signed)
GFR and cr are improved with inc of fluids (64 oz per day) and also d/c of glipizide  Enc him to keep up the good work

## 2016-12-16 NOTE — Assessment & Plan Note (Signed)
Lab Results  Component Value Date   HGBA1C 6.2 12/11/2016   Great improvement Now seeing endocrinology and also going to DM ed classes-better diet and exercise  Off glipizide due to renal insuff Dosing lantus in am  Going down on prednisone as tolerated for BOOP Will continue endocrinology f/u [facility] foot [facility] eye care

## 2016-12-16 NOTE — Assessment & Plan Note (Signed)
Wt is starting to come down with better diet and exercise  Discussed how this problem influences overall health and the risks it imposes  Reviewed plan for weight loss with lower calorie diet (via better food choices and also portion control or program like weight watchers) and exercise building up to or more than 30 minutes 5 days per week including some aerobic activity   Also dec prednisone if successful will also help

## 2016-12-16 NOTE — Progress Notes (Signed)
Pre visit review using our clinic review tool, if applicable. No additional management support is needed unless otherwise documented below in the visit note. 

## 2016-12-16 NOTE — Assessment & Plan Note (Signed)
Doing well with current meds -lasix and metoprolol  No more hypotension since stopping chlorthalidone bp in fair control at this time  BP Readings from Last 1 Encounters:  12/16/16 110/68   No changes needed Disc lifstyle change with low sodium diet and exercise

## 2016-12-16 NOTE — Progress Notes (Signed)
Subjective:    Patient ID: Kurt Aguilar, male    DOB: April 05, 1946, 71 y.o.   MRN: [id number]  HPI Here for f/u of chronic health problems   Wt Readings from Last 3 Encounters:  12/16/16 251 lb 8 oz (114.1 kg)  12/03/16 254 lb (115.2 kg)  11/12/16 258 lb 8 oz (117.3 kg)  weight is coming down  bmi 35.5  bp is stable today  No cp or palpitations or headaches or edema  No side effects to medicines  BP Readings from Last 3 Encounters:  12/16/16 110/68  12/03/16 126/84  11/12/16 128/74    Off chlorthalidone which gave him hypotension  Takes lasix Takes metoprolol  No more hypotension !  Saw Dr Loanne Drilling for diabetes  Changed his lantus to am dosing - that seems to have helped  Also d/c glipizide for renal insufficiency Lab Results  Component Value Date   MICROALBUR 2.1 (H) 02/06/2016  blood glucose readings are improved overall  Seldom above 200 (one 275)  occ up to 170 in the afternoon  Not eating sweets   Lab Results  Component Value Date   HGBA1C 6.2 12/11/2016   This is much improved  We referred him to DM ed at [facility]- really liked his first class Exercising more - water aerobics 3 times per week and golf 1-2 times per week  Also trying to walk every day   BOOP- has cut down prednisone again to 5 mg  This is the 2nd day and hopes it will work out  [employer] tell a difference quite yet    Hx of renal insuff   Chemistry      Component Value Date/Time   NA 142 12/11/2016 0838   K 3.9 12/11/2016 0838   CL 108 12/11/2016 0838   CO2 27 12/11/2016 0838   BUN 30 (H) 12/11/2016 0838   CREATININE 1.44 12/11/2016 0838      Component Value Date/Time   CALCIUM 9.4 12/11/2016 0838   ALKPHOS 32 (L) 12/11/2016 0838   AST 19 12/11/2016 0838   ALT 22 12/11/2016 0838   BILITOT 0.7 12/11/2016 0838     improved off glipizide  Is working on fluid intake   - close to 64 oz per day!   Patient Active Problem List   Diagnosis Date Noted  . Dyspnea on exertion  11/11/2016  . Diabetes mellitus type 2, uncontrolled ([place]) 08/13/2016  . Chronic heel pain, left 08/12/2016  . Upper airway cough syndrome 07/31/2016  . Morbid obesity due to excess calories ([place]) in setting of pred rx  05/19/2016  . Routine general medical examination at a health care facility 01/27/2016  . Pulmonary infiltrates 12/19/2015  . Fatigue 12/12/2015  . Cough 11/29/2015  . Cystic kidney disease 10/01/2015  . Renal lesion 09/27/2015  . Erectile dysfunction of organic origin 09/27/2015  . BPH with obstruction/lower urinary tract symptoms 09/27/2015  . Constipation 09/24/2015  . Blood in semen 09/24/2015  . Cyst of right kidney 09/24/2015  . Leukocytosis 09/24/2015  . CAD (coronary artery disease) 06/08/2015  . Grieving 11/22/2014  . Encounter for Medicare annual wellness exam 07/11/2013  . Prostate cancer screening 07/03/2013  . Right bundle branch block 02/22/2013  . Chronic low back pain 05/13/2011  . Obstructive sleep apnea 03/15/2010  . Hyperlipidemia 04/12/2009  . Gout 04/12/2009  . Essential hypertension 04/12/2009  . CAD (coronary artery disease) of artery bypass graft 04/12/2009  . GERD 04/12/2009  . Renal insufficiency 04/12/2009  .  DIVERTICULITIS, HX OF 04/12/2009   Past Medical History:  Diagnosis Date  . Arthritis   . Atypical migraine   . Diabetes ([place])   . Diverticulitis   . GERD (gastroesophageal reflux disease)   . Gout   . Heart attack   . Heart disease   . Hyperglycemia   . Hyperlipidemia   . Hypertension   . Osteoporosis   . Sleep apnea    Past Surgical History:  Procedure Laterality Date  . CATARACT EXTRACTION Bilateral   . CORONARY ARTERY BYPASS GRAFT     [place]  . open heart surgery  09/09/1995-1998   5 bypass  . REPAIR KNEE LIGAMENT     right  . TONSILLECTOMY     Social History  Substance Use Topics  . Smoking status: Former Smoker    Packs/day: 1.00    Years: 3.00    Types: Cigars    Quit date: 11/25/2004  .  Smokeless tobacco: Never Used     Comment: occas. cigar quit 2006  . Alcohol use 0.6 oz/week    1 Standard drinks or equivalent per week     Comment: rare   Family History  Problem Relation Age of Onset  . Heart disease Mother   . Heart disease Father   . Stroke Father   . Heart attack Son 3  . Kidney disease Neg Hx   . Prostate cancer Neg Hx   . Diabetes Neg Hx    Allergies  Allergen Reactions  . Oxycodone-Acetaminophen     Stops breathing   Current Outpatient Prescriptions on File Prior to Visit  Medication Sig Dispense Refill  . albuterol (VENTOLIN HFA) 108 (90 Base) MCG/ACT inhaler Inhale 2 puffs into the lungs every 4 (four) hours as needed for wheezing or shortness of breath (tight lungs ). 1 Inhaler prn  . allopurinol (ZYLOPRIM) 100 MG tablet take 1 tablet by mouth twice a day (Patient taking differently: take 1 tablet by mouth twice a day with a meal) 180 tablet 1  . aspirin 81 MG tablet 81 mg 2 (two) times daily. Reported on 12/19/2015    . CINNAMON PO Take 1,000 mg by mouth 2 (two) times daily.    . colchicine 0.6 MG tablet Take 1 tablet (0.6 mg total) by mouth 2 (two) times daily as needed. With a meal as needed for acute flare of gout 45 tablet 1  . ezetimibe (ZETIA) 10 MG tablet take 1 tablet by mouth once daily 90 tablet 1  . famotidine (PEPCID) 20 MG tablet Take 1 tablet (20 mg total) by mouth at bedtime. 30 tablet 2  . furosemide (LASIX) 20 MG tablet Take 1 tablet (20 mg total) by mouth daily as needed. Reported on 12/19/2015 30 tablet 3  . gabapentin (NEURONTIN) 300 MG capsule Take 1 capsule (300 mg total) by mouth 3 (three) times daily. 90 capsule 11  . Glucosamine HCl (GLUCOSAMINE PO) Take 1,500 mg by mouth 2 (two) times daily. Reported on 12/19/2015    . glucose blood (PRODIGY NO CODING BLOOD GLUC) test strip Check blood sugar 4 times a day and as directed. Dx E11.65 300 each 5  . Insulin Glargine (LANTUS) 100 UNIT/ML Solostar Pen Inject 50 Units into the skin  daily at 10 pm. (Patient taking differently: Inject 40 Units into the skin daily at 10 pm. ) 45 mL 3  . Insulin Pen Needle 31G X 8 MM MISC Use with Lantus insulin pen 50 each 2  . Lutein 20  MG CAPS Take 1 capsule by mouth daily. Reported on 12/19/2015    . metoprolol tartrate (LOPRESSOR) 25 MG tablet take 1 tablet by mouth twice a day 180 tablet 0  . NONFORMULARY OR COMPOUNDED ITEM Apply 1-2 g topically 4 (four) times daily. 120 each 2  . pantoprazole (PROTONIX) 40 MG tablet Take 1 tablet (40 mg total) by mouth daily before breakfast. 30 tablet 2  . polycarbophil (FIBERCON) 625 MG tablet Take 625 mg by mouth daily. Reported on 12/19/2015    . potassium chloride SA (K-DUR,KLOR-CON) 20 MEQ tablet take 1 tablet by mouth once daily (Patient taking differently: take 1/2 tablet by mouth once daily) 90 tablet 0  . PRODIGY LANCETS 26G MISC 1 each by Does not apply route daily. To check glucose qd and prn for DM2 (250.00) 100 each 3  . rosuvastatin (CRESTOR) 40 MG tablet take 1 tablet by mouth once daily 90 tablet 1  . tadalafil (CIALIS) 20 MG tablet Take 1 tablet (20 mg total) by mouth daily as needed for erectile dysfunction. 10 tablet 12   No current facility-administered medications on file prior to visit.     Review of Systems    Review of Systems  Constitutional: Negative for fever, appetite change, fatigue and unexpected weight change.  Eyes: Negative for pain and visual disturbance.  Respiratory: pos for slt improved  cough and shortness of breath.  (BOOP) Cardiovascular: Negative for cp or palpitations    Gastrointestinal: Negative for nausea, diarrhea and constipation.  Genitourinary: Negative for urgency and frequency. neg for polydipsia  Skin: Negative for pallor or rash   Neurological: Negative for weakness, light-headedness, numbness and headaches.  Hematological: Negative for adenopathy. Does not bruise/bleed easily.  Psychiatric/Behavioral: Negative for dysphoric mood. The patient is  not nervous/anxious.      Objective:   Physical Exam  Constitutional: He appears well-developed and well-nourished. No distress.  Morbidly obese and well appearing   HENT:  Head: Normocephalic and atraumatic.  Mouth/Throat: Oropharynx is clear and moist.  Eyes: Conjunctivae and EOM are normal. Pupils are equal, round, and reactive to light.  Neck: Normal range of motion. Neck supple. No JVD present. Carotid bruit is not present. No thyromegaly present.  Cardiovascular: Normal rate, regular rhythm, normal heart sounds and intact distal pulses.  Exam reveals no gallop.   Pulmonary/Chest: Effort normal and breath sounds normal. No respiratory distress. He has no wheezes. He has no rales.  No crackles  Abdominal: Soft. Bowel sounds are normal. He exhibits no abdominal bruit.  Musculoskeletal: He exhibits no edema or tenderness.  Trace pedal edema w/o pitting  Lymphadenopathy:    He has no cervical adenopathy.  Neurological: He is alert. He has normal reflexes. No cranial nerve deficit. He exhibits normal muscle tone. Coordination normal.  Skin: Skin is warm and dry. No rash noted. No pallor.  Psychiatric: He has a normal mood and affect.          Assessment & Plan:   Problem List Items Addressed This Visit      Cardiovascular and Mediastinum   Essential hypertension - Primary (Chronic)    Doing well with current meds -lasix and metoprolol  No more hypotension since stopping chlorthalidone bp in fair control at this time  BP Readings from Last 1 Encounters:  12/16/16 110/68   No changes needed Disc lifstyle change with low sodium diet and exercise          Endocrine   Diabetes mellitus type 2, uncontrolled ([place])  Lab Results  Component Value Date   HGBA1C 6.2 12/11/2016   Great improvement Now seeing endocrinology and also going to DM ed classes-better diet and exercise  Off glipizide due to renal insuff Dosing lantus in am  Going down on prednisone as tolerated  for BOOP Will continue endocrinology f/u [facility]        Genitourinary   Renal insufficiency    GFR and cr are improved with inc of fluids (64 oz per day) and also d/c of glipizide  Enc him to keep up the good work        Other   Dyspnea on exertion   Morbid obesity due to excess calories ([place]) in setting of pred rx     Wt is starting to come down with better diet and exercise  Discussed how this problem influences overall health and the risks it imposes  Reviewed plan for weight loss with lower calorie diet (via better food choices and also portion control or program like weight watchers) and exercise building up to or more than 30 minutes 5 days per week including some aerobic activity   Also dec prednisone if successful will also help

## 2016-12-16 NOTE — Patient Instructions (Signed)
Keep up the great work with diet /exercise/weight loss  Diabetes is improved  Blood pressure is controlled Renal function is also improved Keep drinkign 64 oz of fluids daily  Keep exercising - give yourself a goal   We will see you in June

## 2016-12-19 ENCOUNTER — Ambulatory Visit (INDEPENDENT_AMBULATORY_CARE_PROVIDER_SITE_OTHER)
Admission: RE | Admit: 2016-12-19 | Discharge: 2016-12-19 | Disposition: A | Discharge status: Home / Self Care | Payer: PPO | Source: Ambulatory Visit | Attending: Internal Medicine | Admitting: Internal Medicine

## 2016-12-19 ENCOUNTER — Ambulatory Visit (INDEPENDENT_AMBULATORY_CARE_PROVIDER_SITE_OTHER): Payer: PPO | Admitting: Internal Medicine

## 2016-12-19 VITALS — BP 124/72 | HR 69 | Ht 70.5 in | Wt 250.0 lb

## 2016-12-19 DIAGNOSIS — R918 Other nonspecific abnormal finding of lung field: Secondary | ICD-10-CM | POA: Diagnosis not present

## 2016-12-19 DIAGNOSIS — R058 Other specified cough: Secondary | ICD-10-CM

## 2016-12-19 DIAGNOSIS — R05 Cough: Secondary | ICD-10-CM

## 2016-12-19 DIAGNOSIS — Z0000 Encounter for general adult medical examination without abnormal findings: Secondary | ICD-10-CM | POA: Diagnosis not present

## 2016-12-19 NOTE — Progress Notes (Signed)
Spoke with pt and notified of results per Dr. Wert. Pt verbalized understanding and denied any questions. 

## 2016-12-19 NOTE — Progress Notes (Signed)
Subjective:    Patient ID: Kurt Aguilar, male    DOB: 03/12/1946,   MRN: [id number]    Brief patient profile:  71yowm never cigarette  Smoker(just some cigars)  after cabg in [place] started noting recurrent winter cough regardless of where located in [place] ([place]/ [place]) with typical acute episode in [place] in Feb 2017  This time cough  more dry less severe than previous >  abx and pred > improved but did not resolve and worse when stopped it so restarted pred/abx > dx as pneumonia with variable as dz > referred to pulmonary clinic 12/19/2015 by [name] with presumed BOOP   History of Present Illness  12/19/2015 1st [facility] office visit/ Machael Raine   Chief Complaint  Patient presents with  . Pulmonary Consult    Referred by [name]. Pt c/o cough x 4 wks- non prod and worse in the evening and when he lies down. Talking and exertion are things that trigger the cough. He has been on pred x 2 and both times cough resolved and immediately returns once done with med. He also c/o SOB- gets winded just walking from room to room at home.   while on prednisone still some weak and sob but better cough and last dose at least one week and worse overall since off it assoc with sob room to room and weakness/ excess/ purulent sputum or mucus plugs / no unusual exp / no ctd.  Has new CPAP machine fall 2016  rec  Omeprazole 20 mg Take 30- 60 min before your first and last meals of the day  Prednisone 10  X 2 each day until 100% better then 10 mg daily  Dx is Brochilitis obliterans with organizing pneumonia vs eosiniphic pneumonia most likely diagnosis GERD  Diet    01/07/2016  f/u ov/Duc Crocket re: prob boop/ on 20 mg pred at 30% improved Chief Complaint  Patient presents with  . Follow-up    Cough and SOB have improved some, but not back to his normal baseline. He states still feeling very weak.   no longer needing cough med, walking down block and back slower than  baseline rec No change rx   02/01/2016  f/u ov/Maika Kaczmarek re: boop tapered pred to 10 mg as 01/28/16  Chief Complaint  Patient presents with  . Follow-up    pt states he is much improved since last office visit.  SOB has resolved.    Not limited by breathing from desired activities / concerned about wt gain from pred  rec Prednisone 10 mg with bfast x 2 week then 5 mg x 2 weeks then 5 mg even days x 2 weeks and stop  If breathing/cough worse or nausea resume previous dose     05/16/2016  f/u ov/Taygan Connell re: boop off pred since early July 2017  Chief Complaint  Patient presents with  . Follow-up    CXR repeated today. Breathing is doing well. No new co's today.   Not limited by breathing from desired activities   rec Please remember to go to the lab   department downstairs for your tests - we will call you with the results when they are available. Weight control iby neg cal balance   06/03/16  PC sick with cough/ sense of chest congestion/ nothing purulent x 3 days and hoarseness  rec pred 20 until better   06/17/2016  f/u ov/Tonishia Steffy re:  prob boop recurrence >> Prednisone down to 10 mg  x 3 days  Chief Complaint  Patient presents with  . Follow-up    Had increased cough 2 wks ago- started back on pred 20 mg and stayed on this dose for 9 days, then started on 10 mg daily. He states cough had improved on 20 mg and started worsening again on 10 mg. His cough is non prod.    cough is daytime and dry overall much better   Not limited by breathing from desired activities   rec Prednisone 10 mg 2 daily until better, then 1 daily for one week,  Then half daily for a week then stop  If flare start over at 2 daily (call if need more)  Whenever needing prednisone or coughing or hoarse > omeprazole(prilosec) is 20 mg Take 30- 60 min before your first and last meals of the day  For cough > take tessalon up to every 4 hours as needed    07/29/2016  f/u ov/Khamora Karan re: ? boop recurrence  Chief Complaint    Patient presents with  . Follow-up    6wk rov. pt states breathing has slightly improved. pt sob with exertion, chest congestion & occ wheezing.  while on omeprazole bid ac and predisone down to 5 mg x 8 days cough worse so started back on 20 mg daily and helped still feel urge to clear the throat daytime only .   Walked all over [place] on 20 mg daily  rec Start gabapentin 100 mg three times a day  For throat clearing use tessalon pearls as needed and keeps lots of candy handy= sugarless life savers and jolly ranchers Taper prednisone to the lowest dose the works - try taper to 10 alternate with 5 mg to get thru  the holidays      09/11/2016  f/u ov/Vasiliki Smaldone re: BOOP/uacs  on pred 10/5  prilosec 20 mg ac and hs  Chief Complaint  Patient presents with  . Follow-up    Increased SOB for the past 3-4 days. He gets SOB with exertion such as getting dressed or walking accross the room.  He has also started coughing more "feels like I burned my lungs"- prod with white sputum.     was doing ok on 10/5 in terms of breathing but worse gradually x one week p several weeks on the 10/5 dose  Cough some better but still needing freq tessalon on gabapentin 100 tid  rec Prednisone 10 mg  2 daily until better then 1 daily x 2 weeks then 10 alternating with 5 until return Prilosec should be 20 mg Take 30- 60 min before your first and last meals of the day  Increase gabapentin to 300 mg three times daily   .        11/11/2016  f/u ov/Cherity Blickenstaff re: BOOP/ pred 15 a/w 10  Chief Complaint  Patient presents with  . Follow-up    Cough has improved per pt but his spouse thinks it's worse. Pt states his breathing is progressively worse. He has not had to use ventolin inhaler. He is currently taking pred 15 alternating with 10.    cough remains dry day > noct rec Stop prilosec and take pantoprazole (protonix) 40 mg   Take  30-60 min before first meal of the day and Pepcid (famotidine)  20 mg one @  bedtime until return  to office - this is the best way to tell whether stomach acid is contributing to your problem.  Try again to reduce prednisone to 10  mg daily if tolerated     12/19/2016  f/u ov/Nahuel Wilbert re: BOOP prednisone 5 mg x 5 days  Chief Complaint  Patient presents with  . Follow-up    Pt states his cough is much improved and his breathing seems to be doing better. He is currently taking pred 5 mg daily.   cough better on reflux rx/ some watery rhinitis Water aerobics tiw / walking a mile in 15-20 min some hills  No obvious day to day or daytime variability or assoc excess/ purulent sputum or mucus plugs or hemoptysis or cp or chest tightness, subjective wheeze or overt sinus or hb symptoms. No unusual exp hx or h/o childhood pna/ asthma or knowledge of premature birth.  Sleeping ok without nocturnal  or early am exacerbation  of respiratory  c/o's or need for noct saba. Also denies any obvious fluctuation of symptoms with weather or environmental changes or other aggravating or alleviating factors except as outlined above   Current Medications, Allergies, Complete Past Medical History, Past Surgical History, Family History, and Social History were reviewed in [employer] record.  ROS  The following are not active complaints unless bolded sore throat, dysphagia, dental problems, itching, sneezing,  nasal congestion or excess/ purulent secretions, ear ache,   fever, chills, sweats, unintended wt loss, classically pleuritic or exertional cp,  orthopnea pnd or leg swelling, presyncope, palpitations, abdominal pain, anorexia, nausea, vomiting, diarrhea  or change in bowel or bladder habits, change in stools or urine, dysuria,hematuria,  rash, arthralgias, visual complaints, headache, numbness, weakness or ataxia or problems with walking or coordination,  change in mood/affect or memory.                         Objective:   Physical Exam  Pleasant  Wm nad     12/19/2016         250 11/11/2016          258  09/11/2016          252  07/29/2016      244  05/16/2016          238  02/01/2016        235  01/07/2016          228   12/19/15 229 lb 4 oz (103.987 kg)  12/19/15 229 lb 4 oz (103.987 kg)  12/19/15 227 lb (102.967 kg)    Vital signs reviewed/  Note sats 95% RA on arrival  HEENT: nl dentition, turbinates, and oropharynx. Nl external ear canals without cough reflex   NECK :  without JVD/Nodes/TM/ nl carotid upstrokes bilaterally   LUNGS: no acc muscle use,  Nl contour chest  VeryV Min distant  insp crackles in bases with no loner  cough on insp maneuvers   CV:  RRR  no s3 or murmur or increase in P2,  Trace   Sym  Bilateral ankle edema   ABD:  soft and nontender with nl inspiratory excursion in the supine position. No bruits or organomegaly, bowel sounds nl  MS:  Nl gait/ ext warm without deformities, calf tenderness, cyanosis or clubbing No obvious joint restrictions   SKIN: warm and dry without lesions    NEURO:  alert, approp, nl sensorium with  no motor deficits          Labs  reviewed:    Lab Results  Component Value Date   ESRSEDRATE 20 11/11/2016   ESRSEDRATE 27 (H) 09/11/2016  ESRSEDRATE 58 (H) 05/16/2016        CXR PA and Lateral:   12/19/2016 :    I personally reviewed images and agree with radiology impression as follows:    No acute abnormality noted.         Assessment & Plan:

## 2016-12-19 NOTE — Patient Instructions (Addendum)
For drainage /itching/ sneezing/ throat tickle try take CHLORPHENIRAMINE  4 mg - take one every 4 hours as needed - available over the counter- may cause drowsiness so start with just a bedtime dose or two and see how you tolerate it before trying in daytime    If can't tolerate then zyrtec 10 mg taken just at bedtime   Prednisone 5 mg daily x 4 weeks then try every other day    Please remember to go to the  x-ray department downstairs in the basement  for your tests - we will call you with the results when they are available.  Please schedule a follow up office visit in 8 weeks, sooner if needed

## 2016-12-20 ENCOUNTER — Encounter: Payer: Self-pay | Admitting: Internal Medicine

## 2016-12-20 NOTE — Assessment & Plan Note (Addendum)
Trial of gabapentin 100 tid 07/29/2016 >  Still tessalon dep 09/11/2016 so increased to 300 tid and ppi bid ac > not improved 11/11/2016 > rec max gerd rx and sugarless candy > resolved 12/19/2016  But worried about pnds related to seasonal rhinitis which likely to be worse this year with pred taper planned   rec try 1st gen h1 and if can't tol then zyrtec should suffice.

## 2016-12-20 NOTE — Assessment & Plan Note (Signed)
Baseline PFTs 03/18/10  FVC  3.59 (80%) with ERV 67% and dlco 75 corrects to 106 Onset of symptoms feb 2017 with "photo neg pumonary edema pattern" - Allergy profile 12/19/15 >  Eos 0.2 /  IgE  89/ Pos Cat RAST only  12/19/2015  ESR 93 > started on prednisone 20 mg daily  HSP profile 12/19/15 >  Neg  - 01/07/2016 ESR down to 18 but only 30% improved symptomatically > rec continue 20 mg daily until better then 10 mg daily  - 02/01/2016 rec taper off x 6 weeks then ov > 05/16/2016 ESR = 58> restarted pred 06/03/16  - Collagen Vasc dz screen 07/29/2016  Neg  - PFT's  11/11/2016  FEV1 1.91 (56 % ) ratio 72  p 7 % improvement from saba p nothing prior to study with DLCO  42/43 % corrects to 77 % for alv volume but ERV 35%  - ESR  20 on 10 a/w 15 as  Of 11/11/2016 > rec try 10 mg daily  - Prednisone 5 mg daily as of 12/15/16   No evidence at all of a flare with pred but there is still a risk of late recurrence given that this was idiopathic BOOP most likely > challenge now is to see if has any problem with adrenal reserve so rec taper to 5 mg qod then will do 2.5 qod next ov in 8 m and off thereafter if tol and no recurrence  Each maintenance medication was reviewed in detail including most importantly the difference between maintenance and as needed and under what circumstances the prns are to be used.  Please see AVS for specific  Instructions which are unique to this visit and I personally typed out  which were reviewed in detail in writing with the patient and a copy provided.

## 2016-12-20 NOTE — Assessment & Plan Note (Signed)
Body mass index is 35.36 kg/m.  -  trending down with pred taper, encouraged Lab Results  Component Value Date   TSH 1.27 11/11/2016     Contributing to gerd risk/ doe/reviewed the need and the process to achieve and maintain neg calorie balance > defer f/u primary care including intermittently monitoring thyroid status

## 2016-12-21 NOTE — Progress Notes (Signed)
Cardiology Office Note  Date:  12/22/2016   ID:  Kurt Aguilar, DOB 08-21-1946, MRN [id number]  PCP:  Loura Pardon, MD   Chief Complaint  Patient presents with  . other    48mo f/u. Pt states he is doing well. Pt states he started insulin in [place]. Reviewed meds with pt verbally.    HPI:  71 yo with history of  CAD,  CABG back in 52 in [place], [place],  HTN,  diabetes hyperlipidemia,   gout.  baseline creatinine mildly elevated He lost his son in early 2016 to cardiac arrest presents for routine followup of his coronary artery disease.  Issue in [place] in 10/2016 "thought I was having a stroke" Was seen in the emergency room for confusion BMP performed showing creatinine 1.69, BUN 45 Chlorthalidone was held Repeat BMP early March 2018 showing creatinine 1.69 Baseline creatinine typically 1.4 For some reason he has continued his potassium  Only takes Lasix as needed for ankle swelling  h/o reactive airway symptoms, Boop PreviouslyStarted on prednisoneMarch 2017  for shortness of breath, improvement of symptoms,  cough returned after prednisone was discontinued getting close To his baseline Minimal wheezing and cough predisone 5 mg daily, Plan may be to wean the dose Managed by Dr. [name] , Pulmonary  Walks one mile a day, golfing 2 x week Water Aerobics 3 x a week Reports having regional exercise tolerance  Other major issue was hemoglobin A1c going up to 11 Started On insulin, daily in Am HBA1C down to 6 Also now watching his diet  Previous CT scan of the chest   showing diffuse coronary calcifications of the LAD, mild calcifications in the aorta Images show interstitial disease bilaterally, worse on the right  EKG personally reviewed by myself on todays visit shows normal sinus rhythm with rate 65 bpm, right bundle branch block, no change from prior EKG  Lab work reviewed,  Creatinine 1.69 Since then he has held his chlorthalidone Total chol 159, LDL  72 Hemoglobin A1c 6.0  Other past medical history  stress test in March of 2011 shows mildly decreased perfusion in the anterior wall at rest with moderately decreased perfusion with stress. He had no significant symptoms with testing.  PMH:   has a past medical history of Arthritis; Atypical migraine; Diabetes ([place]); Diverticulitis; GERD (gastroesophageal reflux disease); Gout; Heart attack ([place]); Heart disease; Hyperglycemia; Hyperlipidemia; Hypertension; Osteoporosis; and Sleep apnea.  PSH:    Past Surgical History:  Procedure Laterality Date  . CATARACT EXTRACTION Bilateral   . CORONARY ARTERY BYPASS GRAFT     [place]  . open heart surgery  09/09/1995-1998   5 bypass  . REPAIR KNEE LIGAMENT     right  . TONSILLECTOMY      Current Outpatient Prescriptions  Medication Sig Dispense Refill  . albuterol (VENTOLIN HFA) 108 (90 Base) MCG/ACT inhaler Inhale 2 puffs into the lungs every 4 (four) hours as needed for wheezing or shortness of breath (tight lungs ). 1 Inhaler prn  . allopurinol (ZYLOPRIM) 100 MG tablet take 1 tablet by mouth twice a day (Patient taking differently: take 1 tablet by mouth twice a day with a meal) 180 tablet 1  . aspirin 81 MG tablet 81 mg 2 (two) times daily. Reported on 12/19/2015    . azelastine (OPTIVAR) 0.05 % ophthalmic solution Place 1 drop into both eyes as needed.    Marland Kitchen CINNAMON PO Take 1,000 mg by mouth 2 (two) times daily.    . colchicine  0.6 MG tablet Take 1 tablet (0.6 mg total) by mouth 2 (two) times daily as needed. With a meal as needed for acute flare of gout 45 tablet 1  . ezetimibe (ZETIA) 10 MG tablet take 1 tablet by mouth once daily 90 tablet 1  . famotidine (PEPCID) 20 MG tablet Take 1 tablet (20 mg total) by mouth at bedtime. 30 tablet 2  . furosemide (LASIX) 20 MG tablet Take 1 tablet (20 mg total) by mouth daily as needed. Reported on 12/19/2015 30 tablet 3  . gabapentin (NEURONTIN) 300 MG capsule Take 1 capsule (300 mg total) by  mouth 3 (three) times daily. 90 capsule 11  . Glucosamine HCl (GLUCOSAMINE PO) Take 1,500 mg by mouth 2 (two) times daily. Reported on 12/19/2015    . glucose blood (PRODIGY NO CODING BLOOD GLUC) test strip Check blood sugar 4 times a day and as directed. Dx E11.65 300 each 5  . Insulin Glargine (LANTUS) 100 UNIT/ML Solostar Pen Inject 50 Units into the skin daily at 10 pm. (Patient taking differently: Inject 40 Units into the skin daily at 10 pm. ) 45 mL 3  . Insulin Pen Needle 31G X 8 MM MISC Use with Lantus insulin pen 50 each 2  . Lutein 20 MG CAPS Take 1 capsule by mouth daily. Reported on 12/19/2015    . metoprolol tartrate (LOPRESSOR) 25 MG tablet take 1 tablet by mouth twice a day 180 tablet 0  . NONFORMULARY OR COMPOUNDED ITEM Apply 1-2 g topically 4 (four) times daily. 120 each 2  . pantoprazole (PROTONIX) 40 MG tablet Take 1 tablet (40 mg total) by mouth daily before breakfast. 30 tablet 2  . polycarbophil (FIBERCON) 625 MG tablet Take 625 mg by mouth daily. Reported on 12/19/2015    . potassium chloride SA (K-DUR,KLOR-CON) 20 MEQ tablet take 1 tablet by mouth once daily (Patient taking differently: take 1/2 tablet by mouth once daily) 90 tablet 0  . PRODIGY LANCETS 26G MISC 1 each by Does not apply route daily. To check glucose qd and prn for DM2 (250.00) 100 each 3  . rosuvastatin (CRESTOR) 40 MG tablet take 1 tablet by mouth once daily 90 tablet 1  . tadalafil (CIALIS) 20 MG tablet Take 1 tablet (20 mg total) by mouth daily as needed for erectile dysfunction. 10 tablet 12   No current facility-administered medications for this visit.      Allergies:   Oxycodone-acetaminophen   Social History:  The patient  reports that he quit smoking about 12 years ago. His smoking use included Cigars. He has a 3.00 pack-year smoking history. He has never used smokeless tobacco. He reports that he drinks about 0.6 oz of alcohol per week . He reports that he does not use drugs.   Family History:    family history includes Heart attack (age of onset: 38) in his son; Heart disease in his father and mother; Stroke in his father.    Review of Systems: Review of Systems  Constitutional: Negative.        Weight gain  Respiratory: Negative.   Cardiovascular: Negative.   Gastrointestinal: Negative.   Musculoskeletal: Negative.   Neurological: Negative.   Psychiatric/Behavioral: Negative.   All other systems reviewed and are negative.    PHYSICAL EXAM: VS:  BP 128/78 (BP Location: Left Arm, Patient Position: Sitting, Cuff Size: Normal)   Pulse 65   Ht 5' 10.5" (1.791 m)   Wt 251 lb 12 oz (114.2 kg)  BMI 35.61 kg/m  , BMI Body mass index is 35.61 kg/m. GEN: Well nourished, well developed, in no acute distress , Obese HEENT: normal  Neck: no JVD, carotid bruits, or masses Cardiac: RRR; no murmurs, rubs, or gallops,no edema  Respiratory:  clear to auscultation bilaterally, normal work of breathing GI: soft, nontender, nondistended, + BS MS: no deformity or atrophy  Skin: warm and dry, no rash Neuro:  Strength and sensation are intact Psych: euthymic mood, full affect    Recent Labs: 11/11/2016: Hemoglobin 14.0; Platelets 232.0; Pro B Natriuretic peptide (BNP) 24.0; TSH 1.27 12/11/2016: ALT 22; BUN 30; Creatinine, Ser 1.44; Potassium 3.9; Sodium 142    Lipid Panel Lab Results  Component Value Date   CHOL 159 02/06/2016   HDL 55.20 02/06/2016   LDLCALC 72 02/06/2016   TRIG 163.0 (H) 02/06/2016      Wt Readings from Last 3 Encounters:  12/22/16 251 lb 12 oz (114.2 kg)  12/19/16 250 lb (113.4 kg)  12/16/16 251 lb 8 oz (114.1 kg)       ASSESSMENT AND PLAN:  Pure hypercholesterolemia - Plan: EKG 12-Lead Scheduled to have labwork done through primary care next month  Essential hypertension - Plan: EKG 12-Lead Chlorthalidone recently held in the setting of dehydration, hypotension Recommended he hold the potassium as well, only take potassium when he takes Lasix  sparingly  Coronary artery disease involving coronary bypass graft of native heart without angina pectoris - Chronic mild shortness of breath, stable. Currently with no symptoms of angina. No further workup at this time. Continue current medication regimen.  Controlled type 2 diabetes mellitus with diabetic nephropathy, unspecified long term insulin use status ([place]) - Plan: EKG 12-Lead Stressed importance of weight loss, low carbohydrate diet Dramatic improvement in hemoglobin A1c with insulin  Morbid obesity due to excess calories ([facility]) in setting of pred rx  - Plan: EKG 12-Lead Doing better with his diet, now doing regular exercise program  Reactive airway disease Clinical exam essentially benign today Weaning down on his potassium Followed by pulmonary   Total encounter time more than 25 minutes  Greater than 50% was spent in counseling and coordination of care with the patient    Disposition:   F/U  6 months   Orders Placed This Encounter  Procedures  . EKG 12-Lead     Signed, Esmond Plants, M.D., Ph.D. 12/22/2016  [place], [place]

## 2016-12-22 ENCOUNTER — Ambulatory Visit (INDEPENDENT_AMBULATORY_CARE_PROVIDER_SITE_OTHER): Payer: PPO | Admitting: Cardiovascular Disease

## 2016-12-22 ENCOUNTER — Encounter: Payer: Self-pay | Admitting: Cardiovascular Disease

## 2016-12-22 VITALS — BP 128/78 | HR 65 | Ht 70.5 in | Wt 251.8 lb

## 2016-12-22 DIAGNOSIS — R918 Other nonspecific abnormal finding of lung field: Secondary | ICD-10-CM | POA: Diagnosis not present

## 2016-12-22 DIAGNOSIS — E782 Mixed hyperlipidemia: Secondary | ICD-10-CM | POA: Diagnosis not present

## 2016-12-22 DIAGNOSIS — R0609 Other forms of dyspnea: Secondary | ICD-10-CM

## 2016-12-22 DIAGNOSIS — I25708 Atherosclerosis of coronary artery bypass graft(s), unspecified, with other forms of angina pectoris: Secondary | ICD-10-CM | POA: Diagnosis not present

## 2016-12-22 DIAGNOSIS — I10 Essential (primary) hypertension: Secondary | ICD-10-CM

## 2016-12-22 DIAGNOSIS — E1165 Type 2 diabetes mellitus with hyperglycemia: Secondary | ICD-10-CM

## 2016-12-22 DIAGNOSIS — N289 Disorder of kidney and ureter, unspecified: Secondary | ICD-10-CM

## 2016-12-22 DIAGNOSIS — E1159 Type 2 diabetes mellitus with other circulatory complications: Secondary | ICD-10-CM

## 2016-12-22 DIAGNOSIS — IMO0002 Reserved for concepts with insufficient information to code with codable children: Secondary | ICD-10-CM

## 2016-12-22 NOTE — Patient Instructions (Signed)

## 2017-01-02 ENCOUNTER — Ambulatory Visit: Payer: PPO | Admitting: Endocrinology

## 2017-01-05 ENCOUNTER — Encounter: Payer: Self-pay | Admitting: Endocrinology

## 2017-01-05 ENCOUNTER — Telehealth: Payer: Self-pay | Admitting: Family Medicine

## 2017-01-05 ENCOUNTER — Ambulatory Visit (INDEPENDENT_AMBULATORY_CARE_PROVIDER_SITE_OTHER): Payer: PPO | Admitting: Endocrinology

## 2017-01-05 VITALS — BP 126/78 | HR 58 | Ht 71.0 in | Wt 253.0 lb

## 2017-01-05 DIAGNOSIS — E1165 Type 2 diabetes mellitus with hyperglycemia: Secondary | ICD-10-CM

## 2017-01-05 DIAGNOSIS — E1159 Type 2 diabetes mellitus with other circulatory complications: Secondary | ICD-10-CM

## 2017-01-05 DIAGNOSIS — IMO0002 Reserved for concepts with insufficient information to code with codable children: Secondary | ICD-10-CM

## 2017-01-05 MED ORDER — INSULIN GLARGINE 100 UNIT/ML SOLOSTAR PEN: 40.0000 [IU] | PEN_INJECTOR | SUBCUTANEOUS | 3 refills | Status: DC

## 2017-01-05 NOTE — Progress Notes (Signed)
Subjective:    Patient ID: Kurt Aguilar, male    DOB: 1945/10/10, 71 y.o.   MRN: [id number]  HPI Pt returns for f/u of diabetes mellitus: DM type:  Dx'ed: 5427 Complications: polyneuropathy, renal insufficiency, and CAD Therapy: insulin since early 2018 DKA: never Severe hypoglycemia: never Pancreatitis: never Other: he takes chronic prednisone for BOOP; renal insuff limits oral rx options. Interval history: no cbg record, but states cbg's vary from 90-275.  He is tapering prednisone. He takes lantus 40/d.   Past Medical History:  Diagnosis Date  . Arthritis   . Atypical migraine   . Diabetes ([place])   . Diverticulitis   . GERD (gastroesophageal reflux disease)   . Gout   . Heart attack ([place])   . Heart disease   . Hyperglycemia   . Hyperlipidemia   . Hypertension   . Osteoporosis   . Sleep apnea     Past Surgical History:  Procedure Laterality Date  . CATARACT EXTRACTION Bilateral   . CORONARY ARTERY BYPASS GRAFT     [place]  . open heart surgery  09/09/1995-1998   5 bypass  . REPAIR KNEE LIGAMENT     right  . TONSILLECTOMY      Social History   Social History  . Marital status: Married    Spouse name: N/A  . Number of children: 0  . Years of education: N/A   Occupational History  . Restaurant owner Retired   Social History Main Topics  . Smoking status: Former Smoker    Packs/day: 1.00    Years: 3.00    Types: Cigars    Quit date: 11/25/2004  . Smokeless tobacco: Never Used     Comment: occas. cigar quit 2006  . Alcohol use 0.6 oz/week    1 Standard drinks or equivalent per week     Comment: rare  . Drug use: No  . Sexual activity: No   Other Topics Concern  . Not on file   Social History Narrative  . No narrative on file    Current Outpatient Prescriptions on File Prior to Visit  Medication Sig Dispense Refill  . albuterol (VENTOLIN HFA) 108 (90 Base) MCG/ACT inhaler Inhale 2 puffs into the lungs every 4 (four) hours as  needed for wheezing or shortness of breath (tight lungs ). 1 Inhaler prn  . allopurinol (ZYLOPRIM) 100 MG tablet take 1 tablet by mouth twice a day (Patient taking differently: take 1 tablet by mouth twice a day with a meal) 180 tablet 1  . aspirin 81 MG tablet 81 mg 2 (two) times daily. Reported on 12/19/2015    . azelastine (OPTIVAR) 0.05 % ophthalmic solution Place 1 drop into both eyes as needed.    Marland Kitchen CINNAMON PO Take 1,000 mg by mouth 2 (two) times daily.    . colchicine 0.6 MG tablet Take 1 tablet (0.6 mg total) by mouth 2 (two) times daily as needed. With a meal as needed for acute flare of gout 45 tablet 1  . ezetimibe (ZETIA) 10 MG tablet take 1 tablet by mouth once daily 90 tablet 1  . famotidine (PEPCID) 20 MG tablet Take 1 tablet (20 mg total) by mouth at bedtime. 30 tablet 2  . furosemide (LASIX) 20 MG tablet Take 1 tablet (20 mg total) by mouth daily as needed. Reported on 12/19/2015 30 tablet 3  . gabapentin (NEURONTIN) 300 MG capsule Take 1 capsule (300 mg total) by mouth 3 (three) times daily. 90 capsule  11  . Glucosamine HCl (GLUCOSAMINE PO) Take 1,500 mg by mouth 2 (two) times daily. Reported on 12/19/2015    . glucose blood (PRODIGY NO CODING BLOOD GLUC) test strip Check blood sugar 4 times a day and as directed. Dx E11.65 300 each 5  . Insulin Pen Needle 31G X 8 MM MISC Use with Lantus insulin pen 50 each 2  . Lutein 20 MG CAPS Take 1 capsule by mouth daily. Reported on 12/19/2015    . metoprolol tartrate (LOPRESSOR) 25 MG tablet take 1 tablet by mouth twice a day 180 tablet 0  . NONFORMULARY OR COMPOUNDED ITEM Apply 1-2 g topically 4 (four) times daily. 120 each 2  . pantoprazole (PROTONIX) 40 MG tablet Take 1 tablet (40 mg total) by mouth daily before breakfast. 30 tablet 2  . polycarbophil (FIBERCON) 625 MG tablet Take 625 mg by mouth daily. Reported on 12/19/2015    . PRODIGY LANCETS 26G MISC 1 each by Does not apply route daily. To check glucose qd and prn for DM2 (250.00) 100  each 3  . rosuvastatin (CRESTOR) 40 MG tablet take 1 tablet by mouth once daily 90 tablet 1  . tadalafil (CIALIS) 20 MG tablet Take 1 tablet (20 mg total) by mouth daily as needed for erectile dysfunction. 10 tablet 12  . potassium chloride SA (K-DUR,KLOR-CON) 20 MEQ tablet take 1 tablet by mouth once daily (Patient not taking: Reported on 01/05/2017) 90 tablet 0   No current facility-administered medications on file prior to visit.     Allergies  Allergen Reactions  . Oxycodone-Acetaminophen     Stops breathing    Family History  Problem Relation Age of Onset  . Heart disease Mother   . Heart disease Father   . Stroke Father   . Heart attack Son 57  . Kidney disease Neg Hx   . Prostate cancer Neg Hx   . Diabetes Neg Hx     BP 126/78   Pulse (!) 58   Ht 5\' 11"  (1.803 m)   Wt 253 lb (114.8 kg)   SpO2 97%   BMI 35.29 kg/m   Review of Systems He denies hypoglycemia.      Objective:   Physical Exam VITAL SIGNS:  See vs page.  GENERAL: no distress.  Pulses: dorsalis pedis intact bilat.   MSK: no deformity of the feet CV: 1+ bilat leg edema.  Skin:  no ulcer on the feet.  normal color and temp on the feet.  Old healed surgical scars on both legs (vein harvest).  Neuro: sensation is intact to touch on the feet.         Assessment & Plan:  Insulin-requiring type 2 DM, with renal failure: I advised multiple daily injections, but pt declines.  Check fructosamine.  Patient Instructions  check your blood sugar twice a day.  vary the time of day when you check, between before the 3 meals, and at bedtime.  also check if you have symptoms of your blood sugar being too high or too low.  please keep a record of the readings and bring it to your next appointment here (or you can bring the meter itself).  You can write it on any piece of paper.  please call us sooner if you have a lot of readings under 100 or over 200. blood tests are requested for you today.  We'll let you know  about the results. Please come back for a follow-up appointment in 3 months.

## 2017-01-05 NOTE — Patient Instructions (Addendum)
check your blood sugar twice a day.  vary the time of day when you check, between before the 3 meals, and at bedtime.  also check if you have symptoms of your blood sugar being too high or too low.  please keep a record of the readings and bring it to your next appointment here (or you can bring the meter itself).  You can write it on any piece of paper.  please call us sooner if you have a lot of readings under 100 or over 200.  blood tests are requested for you today.  We'll let you know about the results.  Please come back for a follow-up appointment in 3 months.

## 2017-01-05 NOTE — Telephone Encounter (Signed)
Pt dropped off wellness form [facility] form. Please call when ready. Placing in rx tower.  Thanks

## 2017-01-05 NOTE — Telephone Encounter (Signed)
Done and in IN box thanks

## 2017-01-06 NOTE — Telephone Encounter (Signed)
Form faxed, pt's wife said she didn't need a copy so form sent to scanning

## 2017-01-07 LAB — FRUCTOSAMINE: FRUCTOSAMINE: 218 umol/L (ref 190–270)

## 2017-02-01 ENCOUNTER — Telehealth: Payer: Self-pay | Admitting: Family Medicine

## 2017-02-01 ENCOUNTER — Other Ambulatory Visit: Payer: Self-pay | Admitting: Family Medicine

## 2017-02-01 ENCOUNTER — Other Ambulatory Visit: Payer: Self-pay | Admitting: Internal Medicine

## 2017-02-01 DIAGNOSIS — N401 Enlarged prostate with lower urinary tract symptoms: Principal | ICD-10-CM

## 2017-02-01 DIAGNOSIS — N138 Other obstructive and reflux uropathy: Secondary | ICD-10-CM

## 2017-02-01 DIAGNOSIS — Z0000 Encounter for general adult medical examination without abnormal findings: Secondary | ICD-10-CM

## 2017-02-01 DIAGNOSIS — R918 Other nonspecific abnormal finding of lung field: Secondary | ICD-10-CM

## 2017-02-01 DIAGNOSIS — Z125 Encounter for screening for malignant neoplasm of prostate: Secondary | ICD-10-CM

## 2017-02-01 NOTE — Telephone Encounter (Signed)
-----   Message from Ellamae Sia sent at 01/26/2017  4:06 PM EDT ----- Regarding: Lab orders for Wednesday, 5.30.18 Patient is scheduled for CPX labs, please order future labs, Thanks , Karna Christmas

## 2017-02-04 ENCOUNTER — Other Ambulatory Visit (INDEPENDENT_AMBULATORY_CARE_PROVIDER_SITE_OTHER): Payer: PPO

## 2017-02-04 DIAGNOSIS — Z0000 Encounter for general adult medical examination without abnormal findings: Secondary | ICD-10-CM | POA: Diagnosis not present

## 2017-02-04 DIAGNOSIS — N401 Enlarged prostate with lower urinary tract symptoms: Secondary | ICD-10-CM | POA: Diagnosis not present

## 2017-02-04 DIAGNOSIS — N138 Other obstructive and reflux uropathy: Secondary | ICD-10-CM

## 2017-02-04 DIAGNOSIS — Z125 Encounter for screening for malignant neoplasm of prostate: Secondary | ICD-10-CM

## 2017-02-04 DIAGNOSIS — R7989 Other specified abnormal findings of blood chemistry: Secondary | ICD-10-CM

## 2017-02-04 LAB — LIPID PANEL
Cholesterol: 129 mg/dL (ref 0–200)
HDL: 34 mg/dL — AB (ref >39.00)
NonHDL: 95.35
Total CHOL/HDL Ratio: 4
Triglycerides: 216 mg/dL — ABNORMAL HIGH (ref 0.0–149.0)
VLDL: 43.2 mg/dL — AB (ref 0.0–40.0)

## 2017-02-04 LAB — COMPREHENSIVE METABOLIC PANEL
ALK PHOS: 46 U/L (ref 39–117)
ALT: 16 U/L (ref 0–53)
AST: 20 U/L (ref 0–37)
Albumin: 4.4 g/dL (ref 3.5–5.2)
BUN: 25 mg/dL — AB (ref 6–23)
CHLORIDE: 105 meq/L (ref 96–112)
CO2: 31 meq/L (ref 19–32)
Calcium: 9.8 mg/dL (ref 8.4–10.5)
Creatinine, Ser: 1.7 mg/dL — ABNORMAL HIGH (ref 0.40–1.50)
GFR: 42.16 mL/min — AB (ref >60.00)
Glucose, Bld: 109 mg/dL — ABNORMAL HIGH (ref 70–99)
POTASSIUM: 4.6 meq/L (ref 3.5–5.1)
SODIUM: 142 meq/L (ref 135–145)
TOTAL PROTEIN: 6.6 g/dL (ref 6.0–8.3)
Total Bilirubin: 0.8 mg/dL (ref 0.2–1.2)

## 2017-02-04 LAB — CBC WITH DIFFERENTIAL/PLATELET
Basophils Absolute: 0 10*3/uL (ref 0.0–0.1)
Basophils Relative: 0.4 % (ref 0.0–3.0)
EOS PCT: 5.7 % — AB (ref 0.0–5.0)
Eosinophils Absolute: 0.4 10*3/uL (ref 0.0–0.7)
HCT: 41.9 % (ref 39.0–52.0)
Hemoglobin: 14.1 g/dL (ref 13.0–17.0)
LYMPHS ABS: 2.3 10*3/uL (ref 0.7–4.0)
Lymphocytes Relative: 32 % (ref 12.0–46.0)
MCHC: 33.6 g/dL (ref 30.0–36.0)
MCV: 90.7 fl (ref 78.0–100.0)
MONO ABS: 0.7 10*3/uL (ref 0.1–1.0)
Monocytes Relative: 9.1 % (ref 3.0–12.0)
NEUTROS ABS: 3.8 10*3/uL (ref 1.4–7.7)
NEUTROS PCT: 52.8 % (ref 43.0–77.0)
Platelets: 255 10*3/uL (ref 150.0–400.0)
RBC: 4.62 Mil/uL (ref 4.22–5.81)
RDW: 14.7 % (ref 11.5–15.5)
WBC: 7.2 10*3/uL (ref 4.0–10.5)

## 2017-02-04 LAB — LDL CHOLESTEROL, DIRECT: Direct LDL: 59 mg/dL

## 2017-02-04 LAB — TSH: TSH: 3.83 u[IU]/mL (ref 0.35–4.50)

## 2017-02-04 LAB — PSA, MEDICARE: PSA: 0.62 ng/mL (ref 0.10–4.00)

## 2017-02-09 ENCOUNTER — Ambulatory Visit: Payer: PPO | Admitting: Internal Medicine

## 2017-02-10 ENCOUNTER — Encounter: Payer: Self-pay | Admitting: Internal Medicine

## 2017-02-10 ENCOUNTER — Ambulatory Visit (INDEPENDENT_AMBULATORY_CARE_PROVIDER_SITE_OTHER): Payer: PPO | Admitting: Internal Medicine

## 2017-02-10 ENCOUNTER — Other Ambulatory Visit (INDEPENDENT_AMBULATORY_CARE_PROVIDER_SITE_OTHER): Payer: PPO

## 2017-02-10 VITALS — BP 124/68 | HR 58 | Ht 70.5 in | Wt 250.0 lb

## 2017-02-10 DIAGNOSIS — R918 Other nonspecific abnormal finding of lung field: Secondary | ICD-10-CM

## 2017-02-10 DIAGNOSIS — R05 Cough: Secondary | ICD-10-CM | POA: Diagnosis not present

## 2017-02-10 DIAGNOSIS — R058 Other specified cough: Secondary | ICD-10-CM

## 2017-02-10 LAB — SEDIMENTATION RATE: Sed Rate: 28 mm/hr — ABNORMAL HIGH (ref 0–20)

## 2017-02-10 MED ORDER — PREDNISONE 5 MG (21) PO TBPK: ORAL_TABLET | ORAL | 0 refills | Status: DC

## 2017-02-10 NOTE — Patient Instructions (Addendum)
Prednisone 5mg  one half every other day x 2 weeks and then stop - increase if symptoms recur or get nauseated   Please remember to go to the lab  department downstairs in the basement  for your tests - we will call you with the results when they are available.  Please schedule a follow up visit in 2  months but call sooner if needed

## 2017-02-10 NOTE — Assessment & Plan Note (Signed)
Body mass index is 35.36 kg/m.  -  trending no change  Lab Results  Component Value Date   TSH 3.83 02/04/2017     Contributing to gerd risk/ doe/reviewed the need and the process to achieve and maintain neg calorie balance > defer f/u primary care including intermittently monitoring thyroid status

## 2017-02-10 NOTE — Progress Notes (Signed)
LMTCB

## 2017-02-10 NOTE — Progress Notes (Signed)
Subjective:    Patient ID: Kurt Aguilar, male    DOB: January 03, 1946,   MRN: [id number]    Brief patient profile:  60 yowm never cigarette  Smoker(just some cigars)  after cabg in [place] started noting recurrent winter cough regardless of where located in [place] ([place]/ [place]) with typical acute episode in [place] in Feb 2017  This time cough  more dry less severe than previous >  abx and pred > improved but did not resolve and worse when stopped it so restarted pred/abx > dx as pneumonia with variable as dz > referred to pulmonary clinic 12/19/2015 by Dr Kurt Aguilar with presumed BOOP   History of Present Illness  12/19/2015 1st Kurt Aguilar Pulmonary office visit/ Kurt Aguilar   Chief Complaint  Patient presents with  . Pulmonary Consult    Referred by Dr. Alphonsa Aguilar. Pt c/o cough x 4 wks- non prod and worse in the evening and when he Aguilar down. Talking and exertion are things that trigger the cough. He has been on pred x 2 and both times cough resolved and immediately returns once done with med. He also c/o SOB- gets winded just walking from room to room at home.   while on prednisone still some weak and sob but better cough and last dose at least one week and worse Aguilar since off it assoc with sob room to room and weakness/ excess/ purulent sputum or mucus plugs / no unusual exp / no ctd.  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 Follow-up    Cough and SOB have improved some, but not back to his normal baseline. He states still feeling very weak.   no longer needing cough med, walking down block and back slower than  baseline rec No change rx   02/01/2016  f/u ov/Kurt Aguilar re: boop tapered pred to 10 mg as 01/28/16  Chief Complaint  Patient presents with  . Follow-up    pt states he is much improved since last office visit.  SOB has resolved.    Not limited by breathing from desired activities / concerned about wt gain from pred  rec Prednisone 10 mg with bfast x 2 week then 5 mg x 2 weeks then 5 mg even days x 2 weeks and stop  If breathing/cough worse or nausea resume previous dose     05/16/2016  f/u ov/Kurt Aguilar re: boop off pred since early July 2017  Chief Complaint  Patient presents with  . Follow-up    CXR repeated today. Breathing is doing well. No new co's today.   Not limited by breathing from desired activities   rec Please remember to go to the lab   department downstairs for your tests - we will call you with the results when they are available. Weight control iby neg cal balance   06/03/16  PC sick with cough/ sense of chest congestion/ nothing purulent x 3 days and hoarseness  rec pred 20 until better   06/17/2016  f/u ov/Kurt Aguilar re:  prob boop recurrence >> Prednisone down to 10  mg x 3 days  Chief Complaint  Patient presents with  . Follow-up    Had increased cough 2 wks ago- started back on pred 20 mg and stayed on this dose for 9 days, then started on 10 mg daily. He states cough had improved on 20 mg and started worsening again on 10 mg. His cough is non prod.    cough is daytime and dry Aguilar much better   Not limited by breathing from desired activities   rec Prednisone 10 mg 2 daily until better, then 1 daily for one week,  Then half daily for a week then stop  If flare start over at 2 daily (call if need more)  Whenever needing prednisone or coughing or hoarse > omeprazole(prilosec) is 20 mg Take 30- 60 min before your first and last meals of the day  For cough > take tessalon up to every 4 hours as needed    07/29/2016  f/u ov/Kurt Aguilar re: ? boop recurrence  Chief Complaint    Patient presents with  . Follow-up    6wk rov. pt states breathing has slightly improved. pt sob with exertion, chest congestion & occ wheezing.  while on omeprazole bid ac and predisone down to 5 mg x 8 days cough worse so started back on 20 mg daily and helped still feel urge to clear the throat daytime only .   Walked all over [place] on 20 mg daily  rec Start gabapentin 100 mg three times a day  For throat clearing use tessalon pearls as needed and keeps lots of candy handy= sugarless life savers and jolly ranchers Taper prednisone to the lowest dose the works - try taper to 10 alternate with 5 mg to get thru  the holidays      09/11/2016  f/u ov/Kurt Aguilar re: BOOP/uacs  on pred 10/5  prilosec 20 mg ac and hs  Chief Complaint  Patient presents with  . Follow-up    Increased SOB for the past 3-4 days. He gets SOB with exertion such as getting dressed or walking accross the room.  He has also started coughing more "feels like I burned my lungs"- prod with white sputum.     was doing ok on 10/5 in terms of breathing but worse gradually x one week p several weeks on the 10/5 dose  Cough some better but still needing freq tessalon on gabapentin 100 tid  rec Prednisone 10 mg  2 daily until better then 1 daily x 2 weeks then 10 alternating with 5 until return Prilosec should be 20 mg Take 30- 60 min before your first and last meals of the day  Increase gabapentin to 300 mg three times daily   .        11/11/2016  f/u ov/Kurt Aguilar re: BOOP/ pred 15 a/w 10  Chief Complaint  Patient presents with  . Follow-up    Cough has improved per pt but his spouse thinks it's worse. Pt states his breathing is progressively worse. He has not had to use ventolin inhaler. He is currently taking pred 15 alternating with 10.    cough remains dry day > noct rec Stop prilosec and take pantoprazole (protonix) 40 mg   Take  30-60 min before first meal of the day and Pepcid (famotidine)  20 mg one @  bedtime until return  to office - this is the best way to tell whether stomach acid is contributing to your problem.  Try again to reduce prednisone to  10 mg daily if tolerated     12/19/2016  f/u ov/Kaleigh Spiegelman re: BOOP prednisone 5 mg x 5 days  Chief Complaint  Patient presents with  . Follow-up    Pt states his cough is much improved and his breathing seems to be doing better. He is currently taking pred 5 mg daily.   cough better on reflux rx/ some watery rhinitis Water aerobics tiw / walking a mile in 15-20 min some hills rec For drainage /itching/ sneezing/ throat tickle try take CHLORPHENIRAMINE  4 mg - take one every 4 hours as needed - available over the counter- may cause drowsiness so start with just a bedtime dose or two and see how you tolerate it before trying in daytime   If can't tolerate then zyrtec 10 mg taken just at bedtime  Prednisone 5 mg daily x 4 weeks then try every other day  Please remember to go to the  x-ray department downstairs in the basement  for your tests - we will call you with the results when they are available.     02/10/2017  f/u ov/Sylis Ketchum re:  PF / boop down to 10 mg one half qod and maint on gabapentin 300 tid for UACS Chief Complaint  Patient presents with  . Follow-up    Pt states "doing great"- doing well on the pred 5 mg every other day.    no change ex tol with taper / no cough/ no nausea even on days off   No obvious day to day or daytime variability or assoc excess/ purulent sputum or mucus plugs or hemoptysis or cp or chest tightness, subjective wheeze or overt sinus or hb symptoms. No unusual exp hx or h/o childhood pna/ asthma or knowledge of premature birth.  Sleeping ok without nocturnal  or early am exacerbation  of respiratory  c/o's or need for noct saba. Also denies any obvious fluctuation of symptoms with weather or environmental changes or other aggravating or alleviating factors except as outlined above   Current Medications, Allergies, Complete Past Medical  History, Past Surgical History, Family History, and Social History were reviewed in [employer] record.  ROS  The following are not active complaints unless bolded sore throat, dysphagia, dental problems, itching, sneezing,  nasal congestion or excess/ purulent secretions, ear ache,   fever, chills, sweats, unintended wt loss, classically pleuritic or exertional cp,  orthopnea pnd or leg swelling on prn lasix, presyncope, palpitations, abdominal pain, anorexia, nausea, vomiting, diarrhea  or change in bowel or bladder habits, change in stools or urine, dysuria,hematuria,  rash, arthralgias, visual complaints, headache, numbness, weakness or ataxia or problems with walking or coordination,  change in mood/affect or memory.                    Objective:   Physical Exam  Pleasant  Wm nad     02/10/2017          250  12/19/2016        250 11/11/2016          258  09/11/2016          252  07/29/2016      244  05/16/2016          238  02/01/2016        235  01/07/2016          228   12/19/15 229 lb 4 oz (103.987 kg)  12/19/15 229 lb 4 oz (103.987 kg)  12/19/15 227 lb (102.967 kg)    Vital signs reviewed/  Note sats 95% RA on arrival  HEENT: nl dentition, turbinates, and oropharynx. Nl external ear canals without cough reflex   NECK :  without JVD/Nodes/TM/ nl carotid upstrokes bilaterally   LUNGS: no acc muscle use,  Nl contour chest  With good bs bilaterally / very minimal insp crackles R > L base only on deep insp  CV:  RRR  no s3 or murmur or increase in P2,  Trace Sym  Bilateral ankle edema   ABD:  soft and nontender with nl inspiratory excursion in the supine position. No bruits or organomegaly, bowel sounds nl  MS:  Nl gait/ ext warm without deformities, calf tenderness, cyanosis or clubbing No obvious joint restrictions   SKIN: warm and dry without lesions    NEURO:  alert, approp, nl sensorium with  no motor deficits         Lab Results  Component  Value Date   ESRSEDRATE 28 (H) 02/10/2017   ESRSEDRATE 20 11/11/2016   ESRSEDRATE 27 (H) 09/11/2016               Assessment & Plan:

## 2017-02-11 ENCOUNTER — Telehealth: Payer: Self-pay | Admitting: Internal Medicine

## 2017-02-11 NOTE — Assessment & Plan Note (Addendum)
Baseline PFTs 03/18/10  FVC  3.59 (80%) with ERV 67% and dlco 75 corrects to 106 Onset of symptoms feb 2017 with "photo neg pumonary edema pattern" - Allergy profile 12/19/15 >  Eos 0.2 /  IgE  89/ Pos Cat RAST only  12/19/2015  ESR 93 > started on prednisone 20 mg daily  HSP profile 12/19/15 >  Neg  - 01/07/2016 ESR down to 18 but only 30% improved symptomatically > rec continue 20 mg daily until better then 10 mg daily  - 02/01/2016 rec taper off x 6 weeks then ov > 05/16/2016 ESR = 58> restarted pred 06/03/16  - Collagen Vasc dz screen 07/29/2016  Neg  - PFT's  11/11/2016  FEV1 1.91 (56 % ) ratio 72  p 7 % improvement from saba p nothing prior to study with DLCO  42/43 % corrects to 77 % for alv volume but ERV 35%  - ESR  20 on 10 a/w 15 as  Of 11/11/2016 > rec try 10 mg daily  - Prednisone 5 mg daily as of 12/15/16 > taper to 5 mg qod by 02/10/2017 - 02/10/2017 ESR =  28  rec taper off x 2 weeks   The goal with a chronic steroid dependent illness is always arriving at the lowest effective dose that controls the disease/symptoms and not accepting a set "formula" which is based on statistics or guidelines that don't always take into account patient  variability or the natural hx of the dz in every individual patient, which may well vary over time.  For now therefore I recommend the patient taper completely off over next 2 weeks looking for any flare of cough and shortness of breath or nausea is an indication that he needs to be back on the previous dose that was effective at controlling the symptoms. He was given plenty of extra prednisone for this purpose since he is leaving to go to [place] soon on vacation  I had an extended discussion with the patient reviewing all relevant studies completed to date and  lasting 15 to 20 minutes of a 25 minute visit    Each maintenance medication was reviewed in detail including most importantly the difference between maintenance and prns and under what circumstances the prns are  to be triggered using an action plan format that is not reflected in the computer generated alphabetically organized AVS.    Please see AVS for specific instructions unique to this visit that I personally wrote and verbalized to the the pt in detail and then reviewed with pt  by my nurse highlighting any  changes in therapy recommended at today's visit to their plan of care.

## 2017-02-11 NOTE — Assessment & Plan Note (Signed)
Trial of gabapentin 100 tid 07/29/2016 >  Still tessalon dep 09/11/2016 so increased to 300 tid and ppi bid ac > not improved 11/11/2016 > rec max gerd rx and sugarless candy > resolved 12/19/2016   So far no flare with steroid taper > no change rx

## 2017-02-11 NOTE — Telephone Encounter (Signed)
Tanda Rockers, MD  Rosana Berger, CMA        Call patient : Crist Fat is only slightly higher so that's good, should be able to come off prednisone just as rec at [facility] with patient's wife Izora Gala about results. She verbalized understanding. Will tell patient once he returns home. Nothing else needed at time of call.

## 2017-02-13 ENCOUNTER — Encounter: Payer: Self-pay | Admitting: Family Medicine

## 2017-02-13 ENCOUNTER — Ambulatory Visit (INDEPENDENT_AMBULATORY_CARE_PROVIDER_SITE_OTHER): Payer: PPO

## 2017-02-13 ENCOUNTER — Ambulatory Visit (INDEPENDENT_AMBULATORY_CARE_PROVIDER_SITE_OTHER): Payer: PPO | Admitting: Family Medicine

## 2017-02-13 VITALS — BP 130/78 | HR 55 | Temp 98.1°F | Ht 70.5 in | Wt 251.2 lb

## 2017-02-13 DIAGNOSIS — E1165 Type 2 diabetes mellitus with hyperglycemia: Secondary | ICD-10-CM

## 2017-02-13 DIAGNOSIS — Z0000 Encounter for general adult medical examination without abnormal findings: Secondary | ICD-10-CM

## 2017-02-13 DIAGNOSIS — M1A00X0 Idiopathic chronic gout, unspecified site, without tophus (tophi): Secondary | ICD-10-CM | POA: Diagnosis not present

## 2017-02-13 DIAGNOSIS — N138 Other obstructive and reflux uropathy: Secondary | ICD-10-CM

## 2017-02-13 DIAGNOSIS — N401 Enlarged prostate with lower urinary tract symptoms: Secondary | ICD-10-CM | POA: Diagnosis not present

## 2017-02-13 DIAGNOSIS — IMO0002 Reserved for concepts with insufficient information to code with codable children: Secondary | ICD-10-CM

## 2017-02-13 DIAGNOSIS — Z794 Long term (current) use of insulin: Secondary | ICD-10-CM | POA: Diagnosis not present

## 2017-02-13 DIAGNOSIS — Z125 Encounter for screening for malignant neoplasm of prostate: Secondary | ICD-10-CM | POA: Diagnosis not present

## 2017-02-13 DIAGNOSIS — I10 Essential (primary) hypertension: Secondary | ICD-10-CM

## 2017-02-13 DIAGNOSIS — E1159 Type 2 diabetes mellitus with other circulatory complications: Secondary | ICD-10-CM | POA: Diagnosis not present

## 2017-02-13 DIAGNOSIS — E669 Obesity, unspecified: Secondary | ICD-10-CM

## 2017-02-13 DIAGNOSIS — Z1211 Encounter for screening for malignant neoplasm of colon: Secondary | ICD-10-CM | POA: Insufficient documentation

## 2017-02-13 DIAGNOSIS — N183 Chronic kidney disease, stage 3 (moderate): Secondary | ICD-10-CM | POA: Diagnosis not present

## 2017-02-13 DIAGNOSIS — E782 Mixed hyperlipidemia: Secondary | ICD-10-CM

## 2017-02-13 DIAGNOSIS — E1122 Type 2 diabetes mellitus with diabetic chronic kidney disease: Secondary | ICD-10-CM

## 2017-02-13 DIAGNOSIS — N289 Disorder of kidney and ureter, unspecified: Secondary | ICD-10-CM | POA: Diagnosis not present

## 2017-02-13 LAB — MICROALBUMIN / CREATININE URINE RATIO
CREATININE, U: 163.7 mg/dL
MICROALB/CREAT RATIO: 1.2 mg/g (ref 0.0–30.0)
Microalb, Ur: 2 mg/dL — ABNORMAL HIGH (ref 0.0–1.9)

## 2017-02-13 MED ORDER — ZOSTER VAC RECOMB ADJUVANTED 50 MCG/0.5ML IM SUSR: 0.5000 mL | Freq: Once | INTRAMUSCULAR | 0 refills | Status: AC

## 2017-02-13 MED ORDER — EZETIMIBE 10 MG PO TABS: 10.0000 mg | ORAL_TABLET | Freq: Every day | ORAL | 3 refills | Status: DC

## 2017-02-13 MED ORDER — METOPROLOL TARTRATE 25 MG PO TABS: 25.0000 mg | ORAL_TABLET | Freq: Two times a day (BID) | ORAL | 3 refills | Status: DC

## 2017-02-13 MED ORDER — ALLOPURINOL 100 MG PO TABS
100.0000 mg | ORAL_TABLET | Freq: Two times a day (BID) | ORAL | 1 refills | Status: DC
Start: 2017-02-13 — End: 2017-08-13

## 2017-02-13 MED ORDER — ROSUVASTATIN CALCIUM 40 MG PO TABS: 40.0000 mg | ORAL_TABLET | Freq: Every day | ORAL | 3 refills | Status: DC

## 2017-02-13 NOTE — Assessment & Plan Note (Signed)
No change in voiding Lab Results  Component Value Date   PSA 0.62 02/04/2017   PSA 0.87 02/06/2016   PSA 1.11 09/24/2015

## 2017-02-13 NOTE — Assessment & Plan Note (Signed)
Disc goals for lipids and reasons to control them Rev labs with pt Rev low sat fat diet in detail Controlled with crestor and diet  Close to goal LDL of 70 HDL is down-disc exercise

## 2017-02-13 NOTE — Assessment & Plan Note (Signed)
Doing well with allopuinol

## 2017-02-13 NOTE — Assessment & Plan Note (Signed)
Reviewed health habits including diet and exercise and skin cancer prevention Reviewed appropriate screening tests for age  Also reviewed health mt list, fam hx and immunization status , as well as social and family history   See HPI AMW rev- plans to have further hearing eval Ref for screening colonoscopy Px for shingrix given  Enc to keep working on weight loss

## 2017-02-13 NOTE — Assessment & Plan Note (Signed)
No advance in symptoms  Tolerates nocturia times one at night  Lab Results  Component Value Date   PSA 0.62 02/04/2017   PSA 0.87 02/06/2016   PSA 1.11 09/24/2015

## 2017-02-13 NOTE — Progress Notes (Signed)
PCP notes:   Health maintenance:  Colon cancer screening - PCP please discuss with pt at CPE  Abnormal screenings:   Hearing (right ear only) - failed  Patient concerns:   None  Nurse concerns:  None  Next PCP appt:   02/13/17 @ 1200  I reviewed health advisor's note, was available for consultation, and agree with documentation and plan. Loura Pardon MD

## 2017-02-13 NOTE — Patient Instructions (Addendum)
We will refer you for a colonoscopy   Aim for 64 oz of fluids daily (mostly water) See the kidney doctor as planned  Your Cr is elevated a bit more this time   Keep exercising and eat fish to boost HDL cholesterol    Take care of yourself  Keep working on weight loss !

## 2017-02-13 NOTE — Progress Notes (Signed)
Subjective:   Kurt Aguilar is a 71 y.o. male who presents for Medicare Annual (Subsequent) preventive examination.  Review of Systems:  N/A Cardiac Risk Factors include: advanced age (>39men, >66 women);obesity (BMI >30kg/m2);diabetes mellitus;dyslipidemia;male gender;hypertension     Objective:     Vitals: BP 130/78 (BP Location: Left Arm, Patient Position: Sitting, Cuff Size: Normal)   Pulse (!) 55   Temp 98.1 F (36.7 C) (Oral)   Ht 5' 10.5" (1.791 m) Comment: no shoes  Wt 251 lb 4 oz (114 kg)   SpO2 95%   BMI 35.54 kg/m   Body mass index is 35.54 kg/m.   Tobacco History  Smoking Status  . Former Smoker  . Packs/day: 1.00  . Years: 3.00  . Types: Cigars  . Quit date: 11/25/2004  Smokeless Tobacco  . Never Used    Comment: occas. cigar quit 2006     Counseling given: No   Past Medical History:  Diagnosis Date  . Arthritis   . Atypical migraine   . Diabetes ([place])   . Diverticulitis   . GERD (gastroesophageal reflux disease)   . Gout   . Heart attack ([place])   . Heart disease   . Hyperglycemia   . Hyperlipidemia   . Hypertension   . Osteoporosis   . Sleep apnea    Past Surgical History:  Procedure Laterality Date  . CATARACT EXTRACTION Bilateral   . CORONARY ARTERY BYPASS GRAFT     [place]  . open heart surgery  09/09/1995-1998   5 bypass  . REPAIR KNEE LIGAMENT     right  . TONSILLECTOMY     Family History  Problem Relation Age of Onset  . Heart disease Mother   . Heart disease Father   . Stroke Father   . Heart attack Son 6  . Kidney disease Neg Hx   . Prostate cancer Neg Hx   . Diabetes Neg Hx    History  Sexual Activity  . Sexual activity: No    Outpatient Encounter Prescriptions as of 02/13/2017  Medication Sig  . albuterol (VENTOLIN HFA) 108 (90 Base) MCG/ACT inhaler Inhale 2 puffs into the lungs every 4 (four) hours as needed for wheezing or shortness of breath (tight lungs ).  Marland Kitchen allopurinol (ZYLOPRIM) 100 MG  tablet take 1 tablet by mouth twice a day (Patient taking differently: take 1 tablet by mouth twice a day with a meal)  . aspirin 81 MG tablet 81 mg 2 (two) times daily. Reported on 12/19/2015  . azelastine (OPTIVAR) 0.05 % ophthalmic solution Place 1 drop into both eyes as needed.  Marland Kitchen CINNAMON PO Take 1,000 mg by mouth 2 (two) times daily.  . colchicine 0.6 MG tablet Take 1 tablet (0.6 mg total) by mouth 2 (two) times daily as needed. With a meal as needed for acute flare of gout  . ezetimibe (ZETIA) 10 MG tablet take 1 tablet by mouth once daily  . famotidine (PEPCID) 20 MG tablet take 1 tablet by mouth at bedtime  . furosemide (LASIX) 20 MG tablet Take 1 tablet (20 mg total) by mouth daily as needed. Reported on 12/19/2015  . gabapentin (NEURONTIN) 300 MG capsule Take 1 capsule (300 mg total) by mouth 3 (three) times daily.  . Glucosamine HCl (GLUCOSAMINE PO) Take 1,500 mg by mouth 2 (two) times daily. Reported on 12/19/2015  . glucose blood (PRODIGY NO CODING BLOOD GLUC) test strip Check blood sugar 4 times a day and as directed. Dx  E11.65  . Insulin Glargine (LANTUS) 100 UNIT/ML Solostar Pen Inject 40 Units into the skin every morning.  . Insulin Pen Needle 31G X 8 MM MISC Use with Lantus insulin pen  . Lutein 20 MG CAPS Take 1 capsule by mouth daily. Reported on 12/19/2015  . metoprolol tartrate (LOPRESSOR) 25 MG tablet take 1 tablet by mouth twice a day  . pantoprazole (PROTONIX) 40 MG tablet take 1 tablet by mouth daily before BREAKFAST  . polycarbophil (FIBERCON) 625 MG tablet Take 625 mg by mouth daily. Reported on 12/19/2015  . PRODIGY LANCETS 26G MISC 1 each by Does not apply route daily. To check glucose qd and prn for DM2 (250.00)  . rosuvastatin (CRESTOR) 40 MG tablet take 1 tablet by mouth once daily  . tadalafil (CIALIS) 20 MG tablet Take 1 tablet (20 mg total) by mouth daily as needed for erectile dysfunction.  . [DISCONTINUED] predniSONE (STERAPRED UNI-PAK 21 TAB) 5 MG (21) TBPK  tablet One half every  Other day   No facility-administered encounter medications on file as of 02/13/2017.     Activities of Daily Living In your present state of health, do you have any difficulty performing the following activities: 02/13/2017  Hearing? Y  Vision? N  Difficulty concentrating or making decisions? N  Walking or climbing stairs? N  Dressing or bathing? N  Doing errands, shopping? N  Preparing Food and eating ? N  Using the Toilet? N  In the past six months, have you accidently leaked urine? N  Do you have problems with loss of bowel control? N  Managing your Medications? N  Managing your Finances? N  Housekeeping or managing your Housekeeping? N  Some recent data might be hidden    Patient Care Team: Tower, [name], MD as PCP - General [name], Kathlene November, MD as Consulting Physician (Cardiology) Tanda Rockers, MD as Consulting Physician (Pulmonary Disease) Buren Kos., MD as Referring Physician (Dentistry)    Assessment:     Hearing Screening   125Hz  250Hz  500Hz  1000Hz  2000Hz  3000Hz  4000Hz  6000Hz  8000Hz   Right ear:   40 40 40  0    Left ear:           Comments: Hearing aid - left ear  Vision Screening Comments: Last vision exam in Oct 2017 with Dr Orion Modest   Exercise Activities and Dietary recommendations Current Exercise Habits: Home exercise routine, Type of exercise: walking;Other - see comments (golf), Time (Minutes): 60, Frequency (Times/Week): 7, Weekly Exercise (Minutes/Week): 420, Intensity: Moderate, Exercise limited by: None identified  Goals    . Weight < 196 lb (88.905 kg)          Starting 02/13/2017, I will continue to exercise for at least 60 min daily.       Fall Risk Fall Risk  02/13/2017 02/06/2016 11/15/2015 08/07/2014 07/11/2013  Falls in the past year? No No No No Yes  Number falls in past yr: - - - - 1   Depression Screen PHQ 2/9 Scores 02/13/2017 02/06/2016 11/15/2015 08/07/2014  PHQ - 2 Score 0 0 0 0     Cognitive  Function MMSE - Mini Mental State Exam 02/13/2017 02/06/2016  Orientation to time 5 5  Orientation to Place 5 5  Registration 3 3  Attention/ Calculation 0 0  Recall 3 3  Language- name 2 objects 0 0  Language- repeat 1 1  Language- follow 3 step command 3 3  Language- read & follow direction 0 0  Write a sentence 0 0  Copy design 0 0  Total score 20 20       PLEASE NOTE: A Mini-Cog screen was completed. Maximum score is 20. A value of 0 denotes this part of Folstein MMSE was not completed or the patient failed this part of the Mini-Cog screening.   Mini-Cog Screening Orientation to Time - Max 5 pts Orientation to Place - Max 5 pts Registration - Max 3 pts Recall - Max 3 pts Language Repeat - Max 1 pts Language Follow 3 Step Command - Max 3 pts   Immunization History  Administered Date(s) Administered  . Influenza Split 07/09/2011  . Influenza Whole 05/14/2010, 06/09/2012  . Influenza, High Dose Seasonal PF 05/22/2014, 07/04/2015, 05/16/2016  . Influenza,inj,Quad PF,36+ Mos 07/11/2013  . Pneumococcal Conjugate-13 08/07/2014  . Pneumococcal Polysaccharide-23 09/08/2002, 07/11/2013  . Td 09/08/2004, 02/12/2016  . Zoster 07/09/2011   Screening Tests Health Maintenance  Topic Date Due  . COLONOSCOPY  09/07/2026 (Originally 09/08/2016)  . INFLUENZA VACCINE  04/08/2017  . OPHTHALMOLOGY EXAM  06/08/2017  . HEMOGLOBIN A1C  06/12/2017  . FOOT EXAM  01/05/2018  . URINE MICROALBUMIN  02/13/2018  . TETANUS/TDAP  02/11/2026  . Hepatitis C Screening  Completed  . PNA vac Low Risk Adult  Completed      Plan:     I have personally reviewed and addressed the Medicare Annual Wellness questionnaire and have noted the following in the patient's chart:  A. Medical and social history B. Use of alcohol, tobacco or illicit drugs  C. Current medications and supplements D. Functional ability and status E.  Nutritional status F.  Physical activity G. Advance directives H. List of other  physicians I.  Hospitalizations, surgeries, and ER visits in previous 12 months J.  [place] to include hearing, vision, cognitive, depression L. Referrals and appointments - none  In addition, I have reviewed and discussed with patient certain preventive protocols, quality metrics, and best practice recommendations. A written personalized care plan for preventive services as well as general preventive health recommendations were provided to patient.  See attached scanned questionnaire for additional information.   Signed,   Lindell Noe, MHA, BS, LPN Health Coach

## 2017-02-13 NOTE — Assessment & Plan Note (Signed)
bp in fair control at this time  BP Readings from Last 1 Encounters:  02/13/17 130/78   No changes needed Disc lifstyle change with low sodium diet and exercise  Labs reviewed  Watching renal function

## 2017-02-13 NOTE — Patient Instructions (Addendum)
Mr. Cisnero , Thank you for taking time to come for your Medicare Wellness Visit. I appreciate your ongoing commitment to your health goals. Please review the following plan we discussed and let me know if I can assist you in the future.   These are the goals we discussed: Goals    . Weight < 196 lb (88.905 kg)          Starting 02/13/2017, I will continue to exercise for at least 60 min daily.        This is a list of the screening recommended for you and due dates:  Health Maintenance  Topic Date Due  . Colon Cancer Screening  09/07/2026*  . Flu Shot  04/08/2017  . Eye exam for diabetics  06/08/2017  . Hemoglobin A1C  06/12/2017  . Complete foot exam   01/05/2018  . Urine Protein Check  02/13/2018  . Tetanus Vaccine  02/11/2026  .  Hepatitis C: One time screening is recommended by Center for Disease Control  (CDC) for  adults born from [birth year] through 1967.   Completed  . Pneumonia vaccines  Completed  *Topic was postponed. The date shown is not the original due date.    Preventive Care for Adults  A healthy lifestyle and preventive care can promote health and wellness. Preventive health guidelines for adults include the following key practices.  . A routine yearly physical is a good way to check with your health care provider about your health and preventive screening. It is a chance to share any concerns and updates on your health and to receive a thorough exam.  . Visit your dentist for a routine exam and preventive care every 6 months. Brush your teeth twice a day and floss once a day. Good oral hygiene prevents tooth decay and gum disease.  . The frequency of eye exams is based on your age, health, family medical history, use  of contact lenses, and other factors. Follow your health care provider's ecommendations for frequency of eye exams.  . Eat a healthy diet. Foods like vegetables, fruits, whole grains, low-fat dairy products, and lean protein foods contain the nutrients  you need without too many calories. Decrease your intake of foods high in solid fats, added sugars, and salt. Eat the right amount of calories for you. Get information about a proper diet from your health care provider, if necessary.  . Regular physical exercise is one of the most important things you can do for your health. Most adults should get at least 150 minutes of moderate-intensity exercise (any activity that increases your heart rate and causes you to sweat) each week. In addition, most adults need muscle-strengthening exercises on 2 or more days a week.  Silver Sneakers may be a benefit available to you. To determine eligibility, you may visit the website: www.silversneakers.com or contact program at 305-396-4517 Mon-Fri between 8AM-8PM.   . Maintain a healthy weight. The body mass index (BMI) is a screening tool to identify possible weight problems. It provides an estimate of body fat based on height and weight. Your health care provider can find your BMI and can help you achieve or maintain a healthy weight.   For adults 20 years and older: ? A BMI below 18.5 is considered underweight. ? A BMI of 18.5 to 24.9 is normal. ? A BMI of 25 to 29.9 is considered overweight. ? A BMI of 30 and above is considered obese.   . Maintain normal blood lipids and cholesterol levels  by exercising and minimizing your intake of saturated fat. Eat a balanced diet with plenty of fruit and vegetables. Blood tests for lipids and cholesterol should begin at age 68 and be repeated every 5 years. If your lipid or cholesterol levels are high, you are over 50, or you are at high risk for heart disease, you may need your cholesterol levels checked more frequently. Ongoing high lipid and cholesterol levels should be treated with medicines if diet and exercise are not working.  . If you smoke, find out from your health care provider how to quit. If you do not use tobacco, please do not start.  . If you choose to  drink alcohol, please do not consume more than 2 drinks per day. One drink is considered to be 12 ounces (355 mL) of beer, 5 ounces (148 mL) of wine, or 1.5 ounces (44 mL) of liquor.  . If you are 61-32 years old, ask your health care provider if you should take aspirin to prevent strokes.  . Use sunscreen. Apply sunscreen liberally and repeatedly throughout the day. You should seek shade when your shadow is shorter than you. Protect yourself by wearing long sleeves, pants, a wide-brimmed hat, and sunglasses year round, whenever you are outdoors.  . Once a month, do a whole body skin exam, using a mirror to look at the skin on your back. Tell your health care provider of new moles, moles that have irregular borders, moles that are larger than a pencil eraser, or moles that have changed in shape or color.

## 2017-02-13 NOTE — Assessment & Plan Note (Signed)
Refer for screening colonoscopy  He does c/o of hemorrhoid problems

## 2017-02-13 NOTE — Assessment & Plan Note (Signed)
Discussed how this problem influences overall health and the risks it imposes  Reviewed plan for weight loss with lower calorie diet (via better food choices and also portion control or program like weight watchers) and exercise building up to or more than 30 minutes 5 days per week including some aerobic activity   Now without prednisone- appetite is less and he feels more control of eating  Commended exercise

## 2017-02-13 NOTE — Assessment & Plan Note (Signed)
Watched by renal [name]  Lab Results  Component Value Date   CREATININE 1.70 (H) 02/04/2017    This is up  Disc imp of fluids-will work to mt 64 oz daily  F/u planned

## 2017-02-13 NOTE — Progress Notes (Signed)
Subjective:    Patient ID: Kurt Aguilar, male    DOB: 28-Mar-1946, 71 y.o.   MRN: [id number]  HPI Here for health maintenance exam and to review chronic medical problems  Off of his prednisone since yesterday  Sob is improved -can do more and walk on an incline    Waiting for wt to come off   Had amw visit today Missed 4000 Hz in R ear Has hearing aide in L ear    Hearing Screening   125Hz  250Hz  500Hz  1000Hz  2000Hz  3000Hz  4000Hz  6000Hz  8000Hz   Right ear:   40 40 40  0    Left ear:           Comments: Hearing aid - left ear   Vision Screening Comments: Last vision exam in Oct 2017  he wants to get hearing checked-notes a difference    Interested in shingrix vaccine   (had zostavax in 2012) Will see if covered  Given a px for it   Wt Readings from Last 3 Encounters:  02/13/17 251 lb 4 oz (114 kg)  02/13/17 251 lb 4 oz (114 kg)  02/10/17 250 lb (113.4 kg)  stable  Doing well with diet =trying really hard to back off on all eating  Appetite is decreasing a bit now that he is on less prednisone  bmi is 35.5  colonosocopy 1/08 per pt  Flex sig at the [place] - ? 2011 -no problems  He is interested in getting a colonoscopy  Eye exam 10/17   bp is stable today  No cp or palpitations or headaches or edema  No side effects to medicines  BP Readings from Last 3 Encounters:  02/13/17 130/78  02/13/17 130/78  02/10/17 124/68     Hx of CAD with cabg in the past  He is retaining fluid more with prednisone  Has lasix if he needs it   Sees pulmonary for BooP Prednisone 5 daily -plan to taper off for 2 wk Lab Results  Component Value Date   ESRSEDRATE 28 (H) 02/10/2017     DM2 Seeing endocrinology  Did diabetic teaching  Lab Results  Component Value Date   HGBA1C 6.2 12/11/2016   Improved ! No lows  200 the highest (after pasta)  Getting closer to goal  Exercise is important    Hx of BPH No changes in status  Nocturia once per night  No change in urine  flow  Lab Results  Component Value Date   PSA 0.62 02/04/2017   PSA 0.87 02/06/2016   PSA 1.11 09/24/2015   Hx of renal insufficiency Lab Results  Component Value Date   CREATININE 1.70 (H) 02/04/2017   BUN 25 (H) 02/04/2017   NA 142 02/04/2017   K 4.6 02/04/2017   CL 105 02/04/2017   CO2 31 02/04/2017   Cr is up from 1.44 GFR is 42.1 (down from 51) Hx of hypertensive nephropathy No nsaids except low dose daily aspirin  Not getting in as much water / he forgets to drink (does well when mindful)  Exercising more and drinking less     Hyperlipidemia Lab Results  Component Value Date   CHOL 129 02/04/2017   CHOL 159 02/06/2016   CHOL 187 07/28/2014   Lab Results  Component Value Date   HDL 34.00 (L) 02/04/2017   HDL 55.20 02/06/2016   HDL 44.40 07/28/2014   Lab Results  Component Value Date   LDLCALC 72 02/06/2016   LDLCALC 106 (H)  07/28/2014   LDLCALC 53 11/24/2012   Lab Results  Component Value Date   TRIG 216.0 (H) 02/04/2017   TRIG 163.0 (H) 02/06/2016   TRIG 182.0 (H) 07/28/2014   Lab Results  Component Value Date   CHOLHDL 4 02/04/2017   CHOLHDL 3 02/06/2016   CHOLHDL 4 07/28/2014   Lab Results  Component Value Date   LDLDIRECT 59.0 02/04/2017   LDLDIRECT 65.4 07/04/2013   LDLDIRECT 57.1 08/18/2012  crestor and diet  Is eating better   Lab Results  Component Value Date   WBC 7.2 02/04/2017   HGB 14.1 02/04/2017   HCT 41.9 02/04/2017   MCV 90.7 02/04/2017   PLT 255.0 02/04/2017   Lab Results  Component Value Date   TSH 3.83 02/04/2017    Patient Active Problem List   Diagnosis Date Noted  . Colon cancer screening 02/13/2017  . Dyspnea on exertion 11/11/2016  . Diabetes mellitus type 2, uncontrolled ([place]) 08/13/2016  . Chronic heel pain, left 08/12/2016  . Upper airway cough syndrome 07/31/2016  . Obesity (BMI 30-39.9) 05/19/2016  . Routine general medical examination at a health care facility 01/27/2016  . Pulmonary infiltrates  with hier ESR c/w BOOP/ idiopathic  12/19/2015  . Fatigue 12/12/2015  . Cough 11/29/2015  . Cystic kidney disease 10/01/2015  . Renal lesion 09/27/2015  . Erectile dysfunction of organic origin 09/27/2015  . BPH with obstruction/lower urinary tract symptoms 09/27/2015  . Constipation 09/24/2015  . Cyst of right kidney 09/24/2015  . CAD (coronary artery disease) 06/08/2015  . Grieving 11/22/2014  . Encounter for Medicare annual wellness exam 07/11/2013  . Prostate cancer screening 07/03/2013  . Right bundle branch block 02/22/2013  . Chronic low back pain 05/13/2011  . Obstructive sleep apnea 03/15/2010  . Hyperlipidemia 04/12/2009  . Gout 04/12/2009  . Essential hypertension 04/12/2009  . CAD (coronary artery disease) of artery bypass graft 04/12/2009  . GERD 04/12/2009  . Renal insufficiency 04/12/2009  . DIVERTICULITIS, HX OF 04/12/2009   Past Medical History:  Diagnosis Date  . Arthritis   . Atypical migraine   . Diabetes ([place])   . Diverticulitis   . GERD (gastroesophageal reflux disease)   . Gout   . Heart attack ([place])   . Heart disease   . Hyperglycemia   . Hyperlipidemia   . Hypertension   . Osteoporosis   . Sleep apnea    Past Surgical History:  Procedure Laterality Date  . CATARACT EXTRACTION Bilateral   . CORONARY ARTERY BYPASS GRAFT     [place]  . open heart surgery  09/09/1995-1998   5 bypass  . REPAIR KNEE LIGAMENT     right  . TONSILLECTOMY     Social History  Substance Use Topics  . Smoking status: Former Smoker    Packs/day: 1.00    Years: 3.00    Types: Cigars    Quit date: 11/25/2004  . Smokeless tobacco: Never Used     Comment: occas. cigar quit 2006  . Alcohol use 0.6 oz/week    1 Standard drinks or equivalent per week     Comment: rare   Family History  Problem Relation Age of Onset  . Heart disease Mother   . Heart disease Father   . Stroke Father   . Heart attack Son 27  . Kidney disease Neg Hx   . Prostate cancer  Neg Hx   . Diabetes Neg Hx    Allergies  Allergen Reactions  . Oxycodone-Acetaminophen  Stops breathing   Current Outpatient Prescriptions on File Prior to Visit  Medication Sig Dispense Refill  . albuterol (VENTOLIN HFA) 108 (90 Base) MCG/ACT inhaler Inhale 2 puffs into the lungs every 4 (four) hours as needed for wheezing or shortness of breath (tight lungs ). 1 Inhaler prn  . aspirin 81 MG tablet 81 mg 2 (two) times daily. Reported on 12/19/2015    . azelastine (OPTIVAR) 0.05 % ophthalmic solution Place 1 drop into both eyes as needed.    Marland Kitchen CINNAMON PO Take 1,000 mg by mouth 2 (two) times daily.    . colchicine 0.6 MG tablet Take 1 tablet (0.6 mg total) by mouth 2 (two) times daily as needed. With a meal as needed for acute flare of gout 45 tablet 1  . famotidine (PEPCID) 20 MG tablet take 1 tablet by mouth at bedtime 30 tablet 2  . furosemide (LASIX) 20 MG tablet Take 1 tablet (20 mg total) by mouth daily as needed. Reported on 12/19/2015 30 tablet 3  . gabapentin (NEURONTIN) 300 MG capsule Take 1 capsule (300 mg total) by mouth 3 (three) times daily. 90 capsule 11  . Glucosamine HCl (GLUCOSAMINE PO) Take 1,500 mg by mouth 2 (two) times daily. Reported on 12/19/2015    . glucose blood (PRODIGY NO CODING BLOOD GLUC) test strip Check blood sugar 4 times a day and as directed. Dx E11.65 300 each 5  . Insulin Glargine (LANTUS) 100 UNIT/ML Solostar Pen Inject 40 Units into the skin every morning. 45 mL 3  . Insulin Pen Needle 31G X 8 MM MISC Use with Lantus insulin pen 50 each 2  . Lutein 20 MG CAPS Take 1 capsule by mouth daily. Reported on 12/19/2015    . pantoprazole (PROTONIX) 40 MG tablet take 1 tablet by mouth daily before BREAKFAST 30 tablet 2  . polycarbophil (FIBERCON) 625 MG tablet Take 625 mg by mouth daily. Reported on 12/19/2015    . PRODIGY LANCETS 26G MISC 1 each by Does not apply route daily. To check glucose qd and prn for DM2 (250.00) 100 each 3  . tadalafil (CIALIS) 20 MG  tablet Take 1 tablet (20 mg total) by mouth daily as needed for erectile dysfunction. 10 tablet 12   No current facility-administered medications on file prior to visit.     Review of Systems Review of Systems  Constitutional: Negative for fever, , fatigue and unexpected weight change.  Eyes: Negative for pain and visual disturbance.  Respiratory: Negative for cough and shortness of breath.  (sob on exertion is improved) Cardiovascular: Negative for cp or palpitations   pos for puffy ankles w/o pitting  Gastrointestinal: Negative for nausea, diarrhea and constipation.  Genitourinary: Negative for urgency and frequency.  Skin: Negative for pallor or rash   Neurological: Negative for weakness, light-headedness, numbness and headaches.  Hematological: Negative for adenopathy. Does not bruise/bleed easily.  Psychiatric/Behavioral: Negative for dysphoric mood. The patient is not nervous/anxious.         Objective:   Physical Exam  Constitutional: He appears well-developed and well-nourished. No distress.  obese and well appearing   HENT:  Head: Normocephalic and atraumatic.  Right Ear: External ear normal.  Left Ear: External ear normal.  Nose: Nose normal.  Mouth/Throat: Oropharynx is clear and moist.  Eyes: Conjunctivae and EOM are normal. Pupils are equal, round, and reactive to light. Right eye exhibits no discharge. Left eye exhibits no discharge. No scleral icterus.  Neck: Normal range of motion. Neck supple.  No JVD present. Carotid bruit is not present. No thyromegaly present.  Cardiovascular: Normal rate, regular rhythm and intact distal pulses.  Exam reveals no gallop.   Murmur heard. Pulmonary/Chest: Effort normal and breath sounds normal. No respiratory distress. He has no wheezes. He exhibits no tenderness.  Abdominal: Soft. Bowel sounds are normal. He exhibits no distension, no abdominal bruit and no mass. There is no tenderness.  Musculoskeletal: He exhibits no edema or  tenderness.  Puffy ankles-no pitting edema noted  Lymphadenopathy:    He has no cervical adenopathy.  Neurological: He is alert. He has normal reflexes. No cranial nerve deficit. He exhibits normal muscle tone. Coordination normal.  Skin: Skin is warm and dry. No rash noted. No erythema. No pallor.  Some lentigines Fair complexion  Psychiatric: He has a normal mood and affect.          Assessment & Plan:   Problem List Items Addressed This Visit      Cardiovascular and Mediastinum   Essential hypertension - Primary (Chronic)    bp in fair control at this time  BP Readings from Last 1 Encounters:  02/13/17 130/78   No changes needed Disc lifstyle change with low sodium diet and exercise  Labs reviewed  Watching renal function       Relevant Medications   ezetimibe (ZETIA) 10 MG tablet   metoprolol tartrate (LOPRESSOR) 25 MG tablet   rosuvastatin (CRESTOR) 40 MG tablet     Endocrine   Diabetes mellitus type 2, uncontrolled ([place])    Seeing endocrinology Lab Results  Component Value Date   HGBA1C 6.2 12/11/2016   Doing better Also weaned off prednisone (for now)  Eating better microalb pending Will work on wt loss       Relevant Medications   rosuvastatin (CRESTOR) 40 MG tablet     Genitourinary   BPH with obstruction/lower urinary tract symptoms    No advance in symptoms  Tolerates nocturia times one at night  Lab Results  Component Value Date   PSA 0.62 02/04/2017   PSA 0.87 02/06/2016   PSA 1.11 09/24/2015         Renal insufficiency    Watched by renal [name]  Lab Results  Component Value Date   CREATININE 1.70 (H) 02/04/2017    This is up  Disc imp of fluids-will work to mt 64 oz daily  F/u planned         Other   Colon cancer screening    Refer for screening colonoscopy  He does c/o of hemorrhoid problems       Relevant Orders   Ambulatory referral to Gastroenterology   Gout    Doing well with allopuinol      Hyperlipidemia      Disc goals for lipids and reasons to control them Rev labs with pt Rev low sat fat diet in detail Controlled with crestor and diet  Close to goal LDL of 70 HDL is down-disc exercise       Relevant Medications   ezetimibe (ZETIA) 10 MG tablet   metoprolol tartrate (LOPRESSOR) 25 MG tablet   rosuvastatin (CRESTOR) 40 MG tablet   Obesity (BMI 30-39.9)    Discussed how this problem influences overall health and the risks it imposes  Reviewed plan for weight loss with lower calorie diet (via better food choices and also portion control or program like weight watchers) and exercise building up to or more than 30 minutes 5 days per week including some  aerobic activity   Now without prednisone- appetite is less and he feels more control of eating  Commended exercise       Prostate cancer screening    No change in voiding Lab Results  Component Value Date   PSA 0.62 02/04/2017   PSA 0.87 02/06/2016   PSA 1.11 09/24/2015         Routine general medical examination at a health care facility    Reviewed health habits including diet and exercise and skin cancer prevention Reviewed appropriate screening tests for age  Also reviewed health mt list, fam hx and immunization status , as well as social and family history   See HPI AMW rev- plans to have further hearing eval Ref for screening colonoscopy Px for shingrix given  Enc to keep working on weight loss

## 2017-02-13 NOTE — Assessment & Plan Note (Signed)
Seeing endocrinology Lab Results  Component Value Date   HGBA1C 6.2 12/11/2016   Doing better Also weaned off prednisone (for now)  Eating better microalb pending Will work on wt loss

## 2017-02-13 NOTE — Progress Notes (Signed)
Pre visit review using our clinic review tool, if applicable. No additional management support is needed unless otherwise documented below in the visit note. 

## 2017-02-24 ENCOUNTER — Other Ambulatory Visit: Payer: Self-pay | Admitting: *Deleted

## 2017-02-24 DIAGNOSIS — L570 Actinic keratosis: Secondary | ICD-10-CM | POA: Diagnosis not present

## 2017-02-24 DIAGNOSIS — D225 Melanocytic nevi of trunk: Secondary | ICD-10-CM | POA: Diagnosis not present

## 2017-02-24 DIAGNOSIS — L821 Other seborrheic keratosis: Secondary | ICD-10-CM | POA: Diagnosis not present

## 2017-02-24 DIAGNOSIS — L814 Other melanin hyperpigmentation: Secondary | ICD-10-CM | POA: Diagnosis not present

## 2017-02-24 MED ORDER — COLCHICINE 0.6 MG PO TABS: 0.6000 mg | ORAL_TABLET | Freq: Two times a day (BID) | ORAL | 1 refills | Status: DC | PRN

## 2017-02-24 NOTE — Telephone Encounter (Signed)
Pt notified Rx sent to pharmacy and I also advise pt of Dr. Marliss Coots instructions and pt verbalized understanding

## 2017-02-24 NOTE — Telephone Encounter (Signed)
I sent it to his pharmacy Use as needed Drink lots of fluids Take it with food Update if not improving

## 2017-02-24 NOTE — Telephone Encounter (Signed)
Received message on Triage line pt is having a Gout flare and Rx for Colchicine has expired and needs refill. Pt would like today due to going out of town tomorrow.

## 2017-03-05 ENCOUNTER — Telehealth: Payer: Self-pay | Admitting: Family Medicine

## 2017-03-05 DIAGNOSIS — M1712 Unilateral primary osteoarthritis, left knee: Secondary | ICD-10-CM | POA: Diagnosis not present

## 2017-03-05 DIAGNOSIS — M1711 Unilateral primary osteoarthritis, right knee: Secondary | ICD-10-CM | POA: Diagnosis not present

## 2017-03-05 NOTE — Telephone Encounter (Signed)
Pt dropped off form to be completed by Dr. Glori Bickers. Pt would like form to be mailed upon completion and has enclosed a mailing label. Form left in rx tower to be picked up by CMA. Please advise.

## 2017-03-06 ENCOUNTER — Ambulatory Visit (INDEPENDENT_AMBULATORY_CARE_PROVIDER_SITE_OTHER): Payer: PPO | Admitting: Podiatry

## 2017-03-06 ENCOUNTER — Ambulatory Visit (INDEPENDENT_AMBULATORY_CARE_PROVIDER_SITE_OTHER): Payer: PPO

## 2017-03-06 ENCOUNTER — Encounter: Payer: Self-pay | Admitting: Podiatry

## 2017-03-06 DIAGNOSIS — M7671 Peroneal tendinitis, right leg: Secondary | ICD-10-CM | POA: Diagnosis not present

## 2017-03-06 DIAGNOSIS — M25571 Pain in right ankle and joints of right foot: Secondary | ICD-10-CM | POA: Diagnosis not present

## 2017-03-06 DIAGNOSIS — M7752 Other enthesopathy of left foot: Secondary | ICD-10-CM

## 2017-03-06 DIAGNOSIS — M659 Synovitis and tenosynovitis, unspecified: Secondary | ICD-10-CM | POA: Diagnosis not present

## 2017-03-06 MED ORDER — BETAMETHASONE SOD PHOS & ACET 6 (3-3) MG/ML IJ SUSP: 3.0000 mg | Freq: Once | INTRAMUSCULAR | Status: DC

## 2017-03-06 NOTE — Progress Notes (Signed)
   HPI: Patient presents today for follow-up treatment and evaluation of Achilles retrocalcaneal bursitis left lower extremity. Patient states that his left lower extremity is doing well however he has some significant pain and tenderness to the right ankle and lateral aspect of the right foot. This been going on for several weeks now. Patient goes to [place] for vacation in 2 weeks and he would like to see if using treatment options to alleviate some symptoms for his trip.   Physical Exam: General: The patient is alert and oriented x3 in no acute distress.  Dermatology: Skin is warm, dry and supple bilateral lower extremities. Negative for open lesions or macerations.  Vascular: Palpable pedal pulses bilaterally. No edema or erythema noted. Capillary refill within normal limits.  Neurological: Epicritic and protective threshold grossly intact bilaterally.   Musculoskeletal Exam: Pain on palpation noted to the retrocalcaneal area of the left lower extremity more prominent on the lateral aspect. There is also some pain on palpation and range of motion to the lateral aspect of the right ankle joint consistent with ankle joint type synovitis Pain on palpation also noted along the peroneal tendons of the right lower extremity  Radiographic Exam:  Normal osseous mineralization. Joint spaces preserved. No fracture/dislocation/boney destruction.  There is some diffuse degenerative changes consistent with osteoarthritis throughout the pedal joints of the foot.  Assessment: 1. Right ankle synovitis 2. Right peroneal tendinitis 3. Sub-Achilles bursitis left  Plan of Care:  1. Patient was evaluated. X-rays reviewed today 2. Injection of 0.5 mL Celestone Soluspan injected in the right ankle joint 3. Injection of 0.5 mL Celestone Soluspan injected in the peroneal tendon sheath right lower extremity 4. Injection of 0.5 mL Celestone Soluspan injected into the sub-Achilles bursa of the left lower  extremity. Care was taken to avoid direct injection into the Achilles tendon. 5. Ankle brace dispensed for right lower extremity 6. Return to clinic when necessary  Patient going to [place] on vacation with his daughters and spouse for 2-1/2 weeks   Edrick Kins, DPM [facility]  Dr. Edrick Kins, [address]                                        [place], [place] [address]                Office [phone]  Fax [phone]

## 2017-03-06 NOTE — Telephone Encounter (Signed)
Forms in your inbox to complete once you return

## 2017-03-10 NOTE — Telephone Encounter (Signed)
Spoke to pt and advised letter is available for pickup. We do not schedule [employer] pickups for pts in office

## 2017-03-10 NOTE — Telephone Encounter (Signed)
done

## 2017-03-13 ENCOUNTER — Telehealth: Payer: Self-pay | Admitting: Cardiovascular Disease

## 2017-03-13 NOTE — Telephone Encounter (Signed)
Received records request from [facility], forwarded to [facility] for processing.

## 2017-03-14 ENCOUNTER — Other Ambulatory Visit: Payer: Self-pay | Admitting: Cardiovascular Disease

## 2017-03-18 IMAGING — CT CT ANGIO CHEST
1 of 2 series · 19 of 32 positions shown · IV contrast (isovue)
Comparison: None.

CLINICAL DATA: Shortness of breath and positive D-dimer.

EXAM:
CT ANGIOGRAPHY CHEST WITH CONTRAST
TECHNIQUE: Multidetector CT imaging of the chest was performed using the
standard protocol during bolus administration of intravenous
contrast. Multiplanar CT image reconstructions and MIPs were
obtained to evaluate the vascular anatomy.
CONTRAST:  75 cc Isovue 370 intravenous

[Series 6: thins · axial · 0.77mm/px · z∈[-620,-368]mm · 19 of 278 slices shown]
[im 13/278  lung]
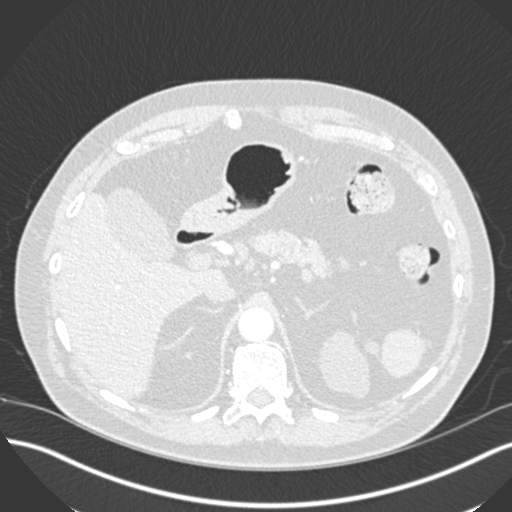
[im 25/278  soft-tissue]
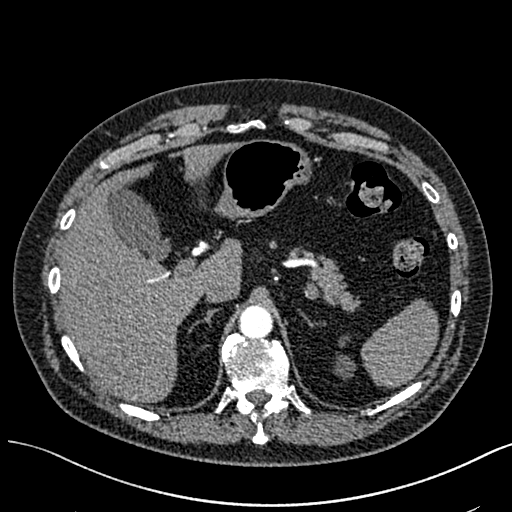
[im 37/278  lung]
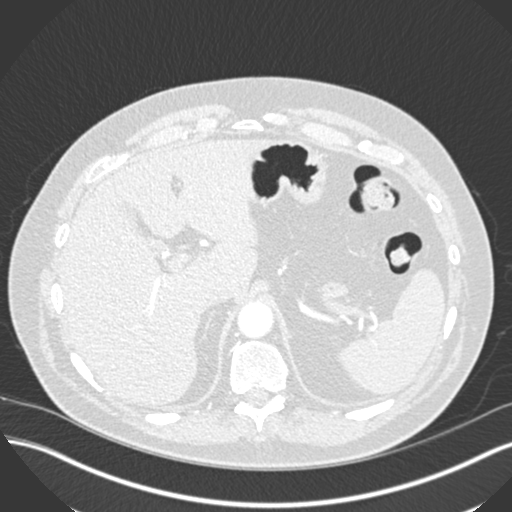
[im 61/278  soft-tissue]
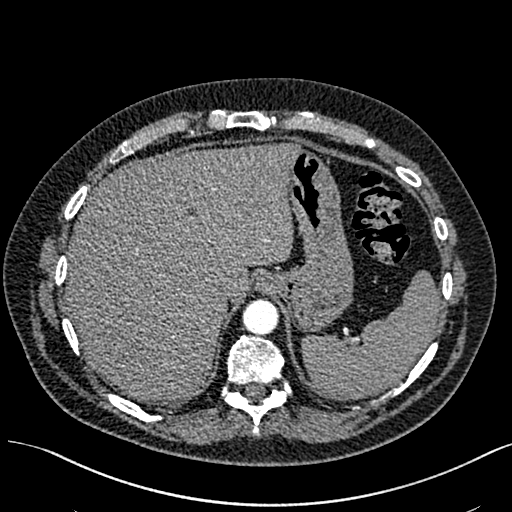
[im 73/278  lung]
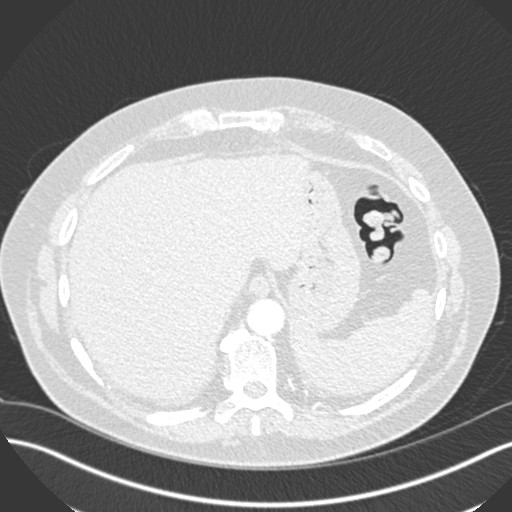
[im 85/278  soft-tissue]
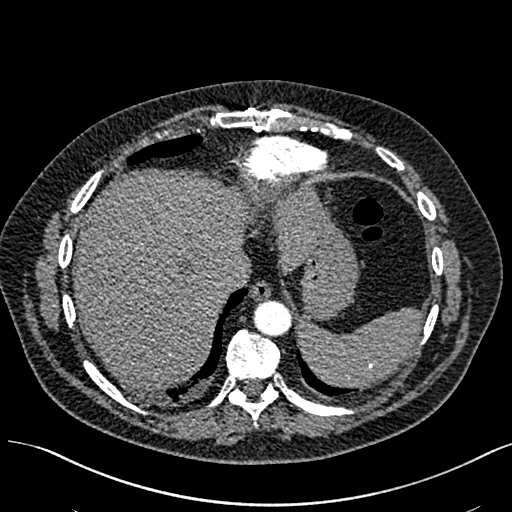
[im 97/278  lung]
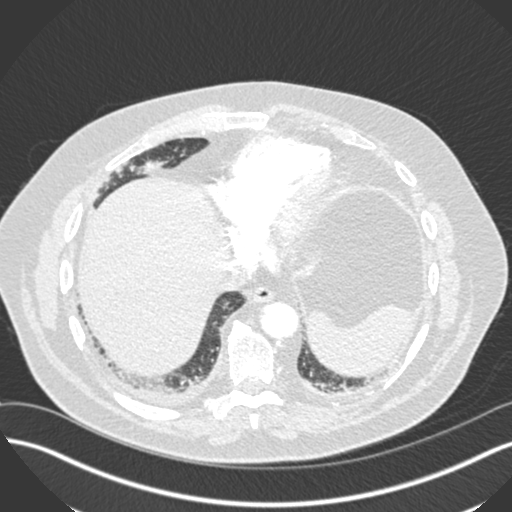
[im 109/278  soft-tissue]
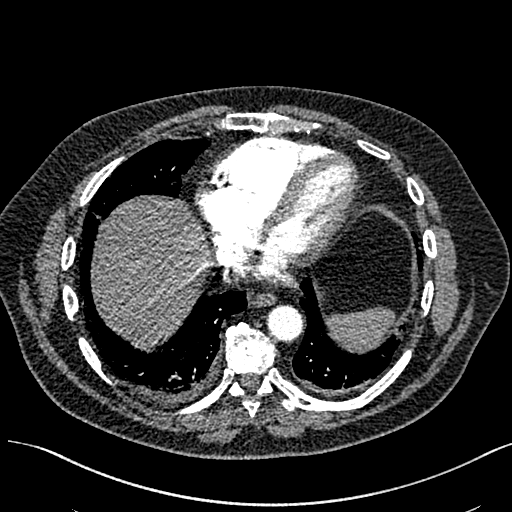
[im 121/278  lung]
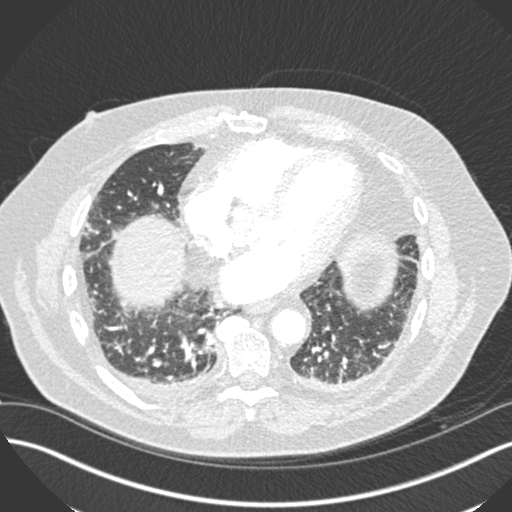
[im 145/278  soft-tissue]
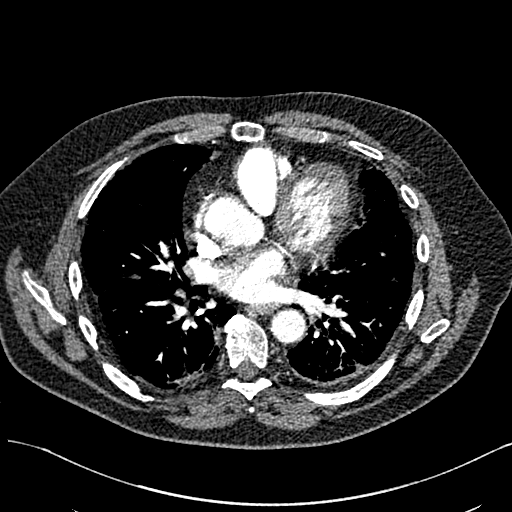
[im 157/278  lung]
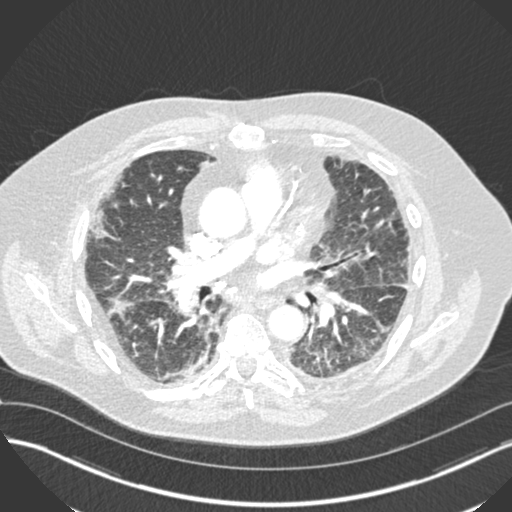
[im 169/278  soft-tissue]
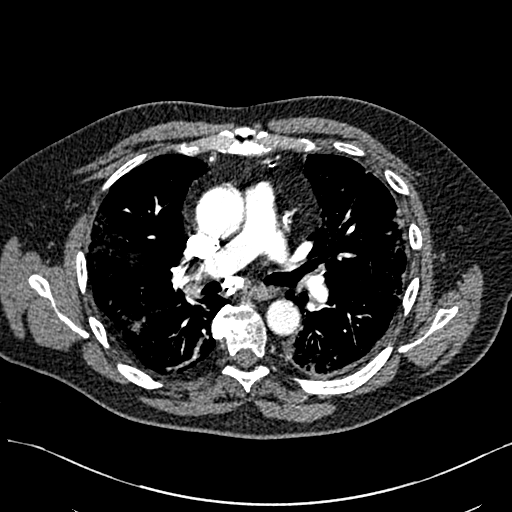
[im 181/278  lung]
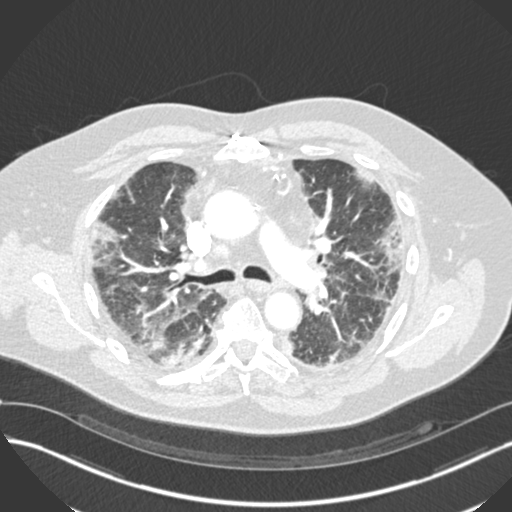
[im 193/278  soft-tissue]
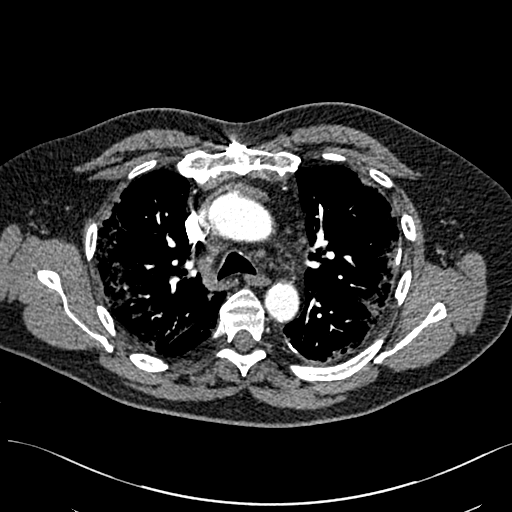
[im 205/278  lung]
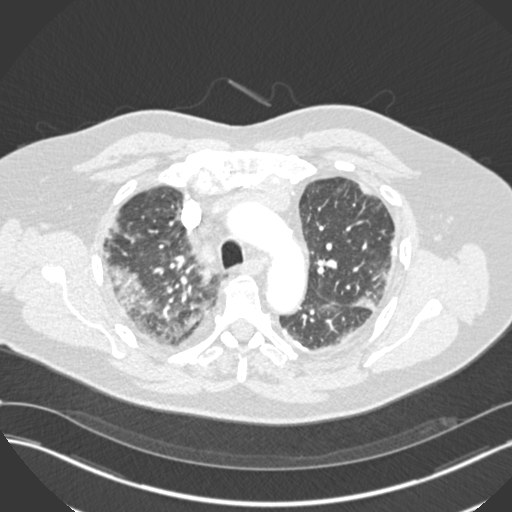
[im 217/278  soft-tissue]
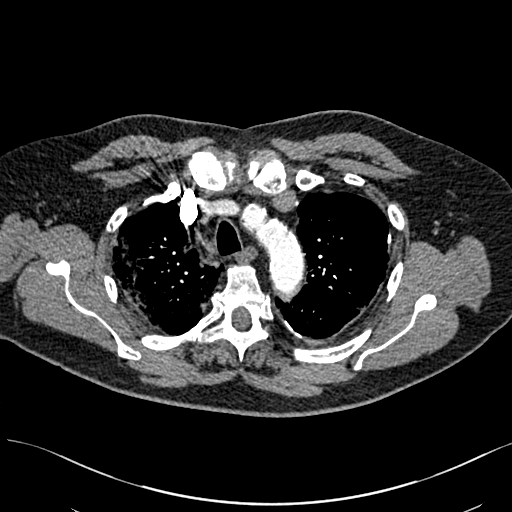
[im 241/278  lung]
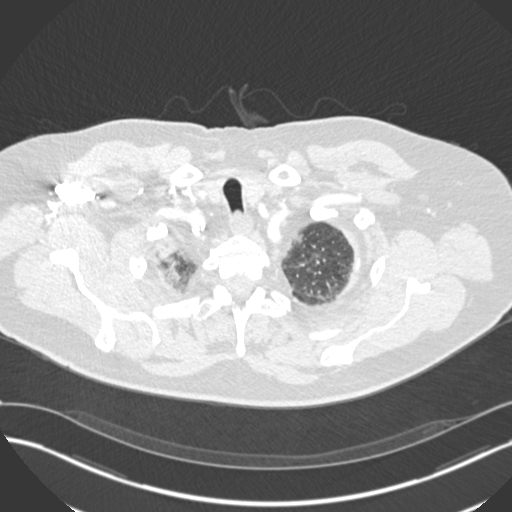
[im 253/278  soft-tissue]
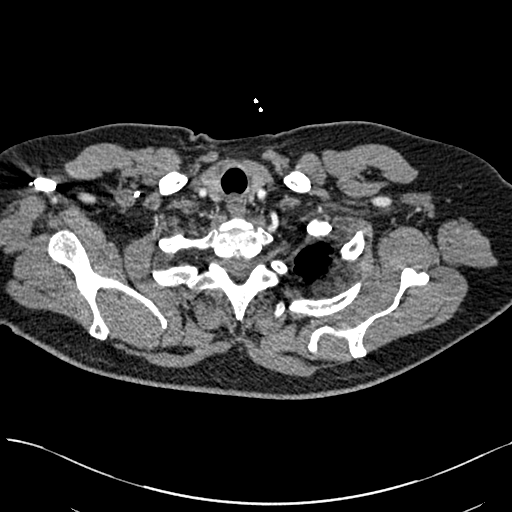
[im 265/278  lung]
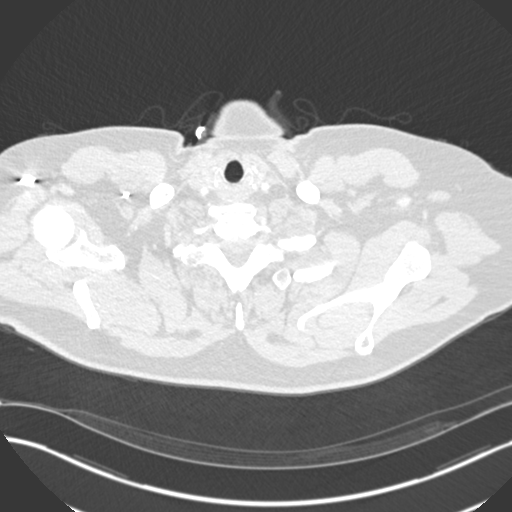

[19 of 32 positions shown; findings below may reference images not displayed]

FINDINGS: THORACIC INLET/BODY WALL:

No acute abnormality.

MEDIASTINUM:

Normal heart size. No pericardial effusion. No evidence of pulmonary
embolism or acute aortic syndrome. Atherosclerosis, including the
coronary arteries. Calcified mediastinal lymph nodes consistent with
remote granulomatous disease. Prominent mediastinal and bilateral
hilar lymph nodes is presumably reactive to the lung disease.

LUNG WINDOWS:

Low volume chest with peripheral ground-glass and consolidative
opacities. There is a mild apical gradient. Thickening along the
fissures without dominant nodular pattern. These opacities have
fluctuated by chest x-ray, present 12/03/2015 but improving
12/12/2015

Calcified pleural plaques in the left upper chest. Lack of symmetry
or basilar predominance would not be typical for asbestos related
pleural disease in this could be posttraumatic or postinflammatory.
A pleural calcification has been present since 5622 chest x-ray, and
preexistent the lung opacity.

UPPER ABDOMEN:

Polycystic liver and kidneys as recently characterized by MRI
09/14/2015. Chronic granulomatous changes in the spleen. No active
disease.

OSSEOUS:

No acute fracture.  No suspicious lytic or blastic lesions.

Review of the MIP images confirms the above findings.
IMPRESSION: 1. No evidence of pulmonary embolism.
2. Acute bilateral airspace disease which has fluctuated by chest
x-ray during [REDACTED] and [place] and has peripheral predilection.
Question atypical pneumonia, acute eosinophilic pneumonia, or acute
hypersensitivity pneumonitis.

## 2017-03-24 ENCOUNTER — Telehealth: Payer: Self-pay | Admitting: Cardiovascular Disease

## 2017-03-24 NOTE — Telephone Encounter (Signed)
Received records request from [facility], forwarded to [facility] for processing.

## 2017-04-04 ENCOUNTER — Other Ambulatory Visit: Payer: Self-pay | Admitting: Family Medicine

## 2017-04-06 NOTE — Telephone Encounter (Signed)
Please refill times one and ask him how his gout is doing  Thanks

## 2017-04-06 NOTE — Telephone Encounter (Signed)
Last CPE was 02/13/17, last filled 02/24/17 #45 tab with 1 additional refill, please advise

## 2017-04-07 NOTE — Telephone Encounter (Signed)
Rx filled and left voicemail requesting pt to call the office and update [place] on how gout is doing

## 2017-04-10 NOTE — Telephone Encounter (Signed)
Lm on pts vm requesting a call back with update 

## 2017-04-15 ENCOUNTER — Encounter: Payer: Self-pay | Admitting: Internal Medicine

## 2017-04-15 ENCOUNTER — Ambulatory Visit (INDEPENDENT_AMBULATORY_CARE_PROVIDER_SITE_OTHER): Payer: PPO | Admitting: Internal Medicine

## 2017-04-15 VITALS — BP 120/78 | HR 82 | Ht 70.5 in | Wt 243.0 lb

## 2017-04-15 DIAGNOSIS — R058 Other specified cough: Secondary | ICD-10-CM

## 2017-04-15 DIAGNOSIS — R05 Cough: Secondary | ICD-10-CM

## 2017-04-15 DIAGNOSIS — R918 Other nonspecific abnormal finding of lung field: Secondary | ICD-10-CM

## 2017-04-15 NOTE — Patient Instructions (Addendum)
Once the protonix runs out,  Ok to try omeprzole 20 mg before bfrast and pepcid at bedtime until you see [name]   Once you run out of gabapentin ok to leave it off and call if refills needed   Please remember to go to the lab and x-ray department downstairs in the basement  for your tests - we will call you with the results when they are available.      If you are satisfied with your treatment plan,  let your doctor know and he/she can either refill your medications or you can return here when your prescription runs out.     If in any way you are not 100% satisfied,  please tell us.  If 100% better, tell your friends!  Pulmonary follow up is as needed

## 2017-04-15 NOTE — Progress Notes (Signed)
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   History of Present Illness  12/19/2015 1st [facility] office visit/ Kurt Aguilar   Chief Complaint  Patient presents with  . Pulmonary Consult    Referred by [name]. Pt c/o cough x 4 wks- non prod and worse in the evening and when he lies down. Talking and exertion are things that trigger the cough. He has been on pred x 2 and both times cough resolved and immediately returns once done with med. He also c/o SOB- gets winded just walking from room to room at home.   while on prednisone still some weak and sob but better cough and last dose at least one week and worse overall since off it assoc with sob room to room and weakness/ excess/ purulent sputum or mucus plugs / no unusual exp / no ctd.  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 Follow-up    Cough and SOB have improved some, but not back to his normal baseline. He states still feeling very weak.   no longer needing cough med, walking down block and back slower than  baseline rec No change rx   02/01/2016  f/u ov/Kurt Aguilar re: boop tapered pred to 10 mg as 01/28/16  Chief Complaint  Patient presents with  . Follow-up    pt states he is much improved since last office visit.  SOB has resolved.    Not limited by breathing from desired activities / concerned about wt gain from pred  rec Prednisone 10 mg with bfast x 2 week then 5 mg x 2 weeks then 5 mg even days x 2 weeks and stop  If breathing/cough worse or nausea resume previous dose     05/16/2016  f/u ov/Kurt Aguilar re: boop off pred since early July 2017  Chief Complaint  Patient presents with  . Follow-up    CXR repeated today. Breathing is doing well. No new co's today.   Not limited by breathing from desired activities   rec Please remember to go to the lab   department downstairs for your tests - we will call you with the results when they are available. Weight control iby neg cal balance   06/03/16  PC sick with cough/ sense of chest congestion/ nothing purulent x 3 days and hoarseness  rec pred 20 until better   06/17/2016  f/u ov/Kurt Aguilar re:  prob boop recurrence >> Prednisone down to 10  mg x 3 days  Chief Complaint  Patient presents with  . Follow-up    Had increased cough 2 wks ago- started back on pred 20 mg and stayed on this dose for 9 days, then started on 10 mg daily. He states cough had improved on 20 mg and started worsening again on 10 mg. His cough is non prod.    cough is daytime and dry overall much better   Not limited by breathing from desired activities   rec Prednisone 10 mg 2 daily until better, then 1 daily for one week,  Then half daily for a week then stop  If flare start over at 2 daily (call if need more)  Whenever needing prednisone or coughing or hoarse > omeprazole(prilosec) is 20 mg Take 30- 60 min before your first and last meals of the day  For cough > take tessalon up to every 4 hours as needed    07/29/2016  f/u ov/Kurt Aguilar re: ? boop recurrence  Chief Complaint    Patient presents with  . Follow-up    6wk rov. pt states breathing has slightly improved. pt sob with exertion, chest congestion & occ wheezing.  while on omeprazole bid ac and predisone down to 5 mg x 8 days cough worse so started back on 20 mg daily and helped still feel urge to clear the throat daytime only .   Walked all over [place] on 20 mg daily  rec Start gabapentin 100 mg three times a day  For throat clearing use tessalon pearls as needed and keeps lots of candy handy= sugarless life savers and jolly ranchers Taper prednisone to the lowest dose the works - try taper to 10 alternate with 5 mg to get thru  the holidays      09/11/2016  f/u ov/Kurt Aguilar re: BOOP/uacs  on pred 10/5  prilosec 20 mg ac and hs  Chief Complaint  Patient presents with  . Follow-up    Increased SOB for the past 3-4 days. He gets SOB with exertion such as getting dressed or walking accross the room.  He has also started coughing more "feels like I burned my lungs"- prod with white sputum.     was doing ok on 10/5 in terms of breathing but worse gradually x one week p several weeks on the 10/5 dose  Cough some better but still needing freq tessalon on gabapentin 100 tid  rec Prednisone 10 mg  2 daily until better then 1 daily x 2 weeks then 10 alternating with 5 until return Prilosec should be 20 mg Take 30- 60 min before your first and last meals of the day  Increase gabapentin to 300 mg three times daily   .        11/11/2016  f/u ov/Kurt Aguilar re: BOOP/ pred 15 a/w 10  Chief Complaint  Patient presents with  . Follow-up    Cough has improved per pt but his spouse thinks it's worse. Pt states his breathing is progressively worse. He has not had to use ventolin inhaler. He is currently taking pred 15 alternating with 10.    cough remains dry day > noct rec Stop prilosec and take pantoprazole (protonix) 40 mg   Take  30-60 min before first meal of the day and Pepcid (famotidine)  20 mg one @  bedtime until return  to office - this is the best way to tell whether stomach acid is contributing to your problem.  Try again to reduce prednisone to  10 mg daily if tolerated     12/19/2016  f/u ov/Feleshia Zundel re: BOOP prednisone 5 mg x 5 days  Chief Complaint  Patient presents with  . Follow-up    Pt states his cough is much improved and his breathing seems to be doing better. He is currently taking pred 5 mg daily.   cough better on reflux rx/ some watery rhinitis Water aerobics tiw / walking a mile in 15-20 min some hills rec For drainage /itching/ sneezing/ throat tickle try take CHLORPHENIRAMINE  4 mg - take one every 4 hours as needed - available over the counter- may cause drowsiness so start with just a bedtime dose or two and see how you tolerate it before trying in daytime   If can't tolerate then zyrtec 10 mg taken just at bedtime  Prednisone 5 mg daily x 4 weeks then try every other day  Please remember to go to the  x-ray department downstairs in the basement  for your tests - we will call you with the results when they are available.     02/10/2017  f/u ov/Roey Coopman re:  PF / boop down to 10 mg one half qod and maint on gabapentin 300 tid for UACS Chief Complaint  Patient presents with  . Follow-up    Pt states "doing great"- doing well on the pred 5 mg every other day.    no change ex tol with taper / no cough/ no nausea even on days off  rec Prednisone 5mg  one half every other day x 2 weeks and then stop - increase if symptoms recur or get nauseated  Please remember to go to the lab  department downstairs in the basement  for your tests - we will call you with the results when they are available.     04/15/2017  f/u ov/Alnisa Hasley re:  PF/ boop / ? Irritable larynx  Chief Complaint  Patient presents with  . Follow-up    Pt states developed a cold mid July 2018 while traveling to [place]. He is feeling better now and not coughing much. No new co's today.   walked all over [place] up hills/ in heat no problem  with sob   Still occ throat clearing better on gabapentin 300 tid and gerd rx / daytime only   No obvious day to day or daytime variability or assoc excess/ purulent sputum or mucus plugs or hemoptysis or cp or chest tightness, subjective wheeze or overt sinus or hb symptoms. No unusual exp hx or h/o childhood pna/ asthma or knowledge of premature birth.  Sleeping ok without nocturnal  or early am exacerbation  of respiratory  c/o's or need for noct saba. Also denies any obvious fluctuation of symptoms with weather or environmental changes or other aggravating or alleviating factors except as outlined above   Current Medications, Allergies, Complete Past Medical History, Past Surgical History, Family History, and Social History were reviewed in [employer] record.  ROS  The following are not active complaints unless bolded sore throat, dysphagia, dental problems, itching, sneezing,  nasal congestion or excess/ purulent secretions, ear ache,   fever, chills, sweats, unintended wt loss, classically pleuritic or exertional cp,  orthopnea pnd or leg swelling, presyncope, palpitations, abdominal pain, anorexia, nausea, vomiting, diarrhea  or change in bowel or bladder habits, change in stools or urine, dysuria,hematuria,  rash, arthralgias, visual complaints, headache, numbness, weakness or ataxia or problems with walking or coordination,  change in mood/affect or memory.  Objective:   Physical Exam  Pleasant  Wm nad      04/15/2017          243  02/10/2017          250  12/19/2016        250 11/11/2016          258  09/11/2016          252  07/29/2016      244  05/16/2016          238  02/01/2016        235  01/07/2016          228   12/19/15 229 lb 4 oz (103.987 kg)  12/19/15 229 lb 4 oz (103.987 kg)  12/19/15 227 lb (102.967 kg)    Vital signs reviewed/  Note sats 98% RA on arrival  HEENT: nl dentition, turbinates, and oropharynx. Nl external ear canals  without cough reflex   NECK :  without JVD/Nodes/TM/ nl carotid upstrokes bilaterally   LUNGS: no acc muscle use,  Nl contour chest  - good bs bilaterally / No significant insp crackles or cough on insp  CV:  RRR  no s3 or murmur or increase in P2,  Trace Sym  Bilateral ankle edema   ABD:  soft and nontender with nl inspiratory excursion in the supine position. No bruits or organomegaly, bowel sounds nl  MS:  Nl gait/ ext warm without deformities, calf tenderness, cyanosis or clubbing No obvious joint restrictions   SKIN: warm and dry without lesions    NEURO:  alert, approp, nl sensorium with  no motor deficits                      Assessment & Plan:   Outpatient Encounter Prescriptions as of 04/15/2017  Medication Sig  . albuterol (VENTOLIN HFA) 108 (90 Base) MCG/ACT inhaler Inhale 2 puffs into the lungs every 4 (four) hours as needed for wheezing or shortness of breath (tight lungs ).  Marland Kitchen allopurinol (ZYLOPRIM) 100 MG tablet Take 1 tablet (100 mg total) by mouth 2 (two) times daily.  Marland Kitchen aspirin 81 MG tablet 81 mg 2 (two) times daily. Reported on 12/19/2015  . azelastine (OPTIVAR) 0.05 % ophthalmic solution Place 1 drop into both eyes as needed.  Marland Kitchen CINNAMON PO Take 1,000 mg by mouth 2 (two) times daily.  . colchicine 0.6 MG tablet TAKE 1 TABLET BY MOUTH TWICE A DAY WITH A MEAL AS NEEDED FOR ACUTE FLARE OF GOUT  . ezetimibe (ZETIA) 10 MG tablet Take 1 tablet (10 mg total) by mouth daily.  . famotidine (PEPCID) 20 MG tablet take 1 tablet by mouth at bedtime  . furosemide (LASIX) 20 MG tablet take 1 tablet by mouth once daily if needed  . gabapentin (NEURONTIN) 300 MG capsule Take 1 capsule (300 mg total) by mouth 3 (three) times daily.  . Glucosamine HCl (GLUCOSAMINE PO) Take 1,500 mg by mouth 2 (two) times daily. Reported on 12/19/2015  . glucose blood (PRODIGY NO CODING BLOOD GLUC) test strip Check blood sugar 4 times a day and as directed. Dx E11.65  . Insulin Glargine  (LANTUS) 100 UNIT/ML Solostar Pen Inject 40 Units into the skin every morning.  . Insulin Pen Needle 31G X 8 MM MISC Use with Lantus insulin pen  . Lutein 20 MG CAPS Take 1 capsule by mouth daily. Reported on 12/19/2015  . metoprolol tartrate (LOPRESSOR) 25 MG tablet Take 1 tablet (  25 mg total) by mouth 2 (two) times daily.  . pantoprazole (PROTONIX) 40 MG tablet take 1 tablet by mouth daily before BREAKFAST  . polycarbophil (FIBERCON) 625 MG tablet Take 625 mg by mouth daily. Reported on 12/19/2015  . PRODIGY LANCETS 26G MISC 1 each by Does not apply route daily. To check glucose qd and prn for DM2 (250.00)  . rosuvastatin (CRESTOR) 40 MG tablet Take 1 tablet (40 mg total) by mouth daily.  . tadalafil (CIALIS) 20 MG tablet Take 1 tablet (20 mg total) by mouth daily as needed for erectile dysfunction.   Facility-Administered Encounter Medications as of 04/15/2017  Medication  . betamethasone acetate-betamethasone sodium phosphate (CELESTONE) injection 3 mg

## 2017-04-16 NOTE — Assessment & Plan Note (Signed)
Body mass index is 34.37 kg/m.  -  trending down off pred, encouraged  Lab Results  Component Value Date   TSH 3.83 02/04/2017     Contributing to gerd risk/ doe/reviewed the need and the process to achieve and maintain neg calorie balance > defer f/u primary care including intermittently monitoring thyroid status

## 2017-04-16 NOTE — Assessment & Plan Note (Signed)
Trial of gabapentin 100 tid 07/29/2016 >  Still tessalon dep 09/11/2016 so increased to 300 tid and ppi bid ac > not improved 11/11/2016 > rec max gerd rx and sugarless candy > resolved 12/19/2016    Clearly better but still occ throat clearing - he can take the gabapentin indefinitely but if remains gabapentin dep will need to let PCP refill or return here yearly for refills

## 2017-04-16 NOTE — Assessment & Plan Note (Addendum)
Baseline PFTs 03/18/10  FVC  3.59 (80%) with ERV 67% and dlco 75 corrects to 106 Onset of symptoms feb 2017 with "photo neg pumonary edema pattern" - Allergy profile 12/19/15 >  Eos 0.2 /  IgE  89/ Pos Cat RAST only  12/19/2015  ESR 93 > started on prednisone 20 mg daily  HSP profile 12/19/15 >  Neg  - 01/07/2016 ESR down to 18 but only 30% improved symptomatically > rec continue 20 mg daily until better then 10 mg daily  - 02/01/2016 rec taper off x 6 weeks then ov > 05/16/2016 ESR = 58> restarted pred 06/03/16  - Collagen Vasc dz screen 07/29/2016  Neg  - PFT's  11/11/2016  FEV1 1.91 (56 % ) ratio 72  p 7 % improvement from saba p nothing prior to study with DLCO  42/43 % corrects to 77 % for alv volume but ERV 35%  - ESR  20 on 10 a/w 15 as  Of 11/11/2016 > rec try 10 mg daily  - Prednisone 5 mg daily as of 12/15/16 > taper to 5 mg qod by 02/10/2017 - 02/10/2017 ESR =  28  rec taper off x 2 weeks > no flare  I had an extended final summary discussion with the patient reviewing all relevant studies completed to date and  lasting 15 to 20 minutes of a 25 minute visit on the following issues:   At this point unlikely to recur but since idiopathic there is always a risk > advised monitor ex tol, stay active and continue to work on wt loss (see separate a/p) and control of upper airway cough syndrome/ irritable larynx  Pulmonary f/u can be prn   Each maintenance medication was reviewed in detail including most importantly the difference between maintenance and as needed and under what circumstances the prns are to be used.  Please see AVS for specific  Instructions which are unique to this visit and I personally typed out  which were reviewed in detail in writing with the patient and a copy provided.

## 2017-04-20 ENCOUNTER — Ambulatory Visit (INDEPENDENT_AMBULATORY_CARE_PROVIDER_SITE_OTHER)
Admission: RE | Admit: 2017-04-20 | Discharge: 2017-04-20 | Disposition: A | Discharge status: Home / Self Care | Payer: PPO | Source: Ambulatory Visit | Attending: Internal Medicine | Admitting: Internal Medicine

## 2017-04-20 ENCOUNTER — Ambulatory Visit (INDEPENDENT_AMBULATORY_CARE_PROVIDER_SITE_OTHER): Payer: PPO | Admitting: Endocrinology

## 2017-04-20 ENCOUNTER — Other Ambulatory Visit (INDEPENDENT_AMBULATORY_CARE_PROVIDER_SITE_OTHER): Payer: PPO

## 2017-04-20 ENCOUNTER — Encounter: Payer: Self-pay | Admitting: Endocrinology

## 2017-04-20 VITALS — BP 128/62 | HR 68 | Wt 240.2 lb

## 2017-04-20 DIAGNOSIS — R918 Other nonspecific abnormal finding of lung field: Secondary | ICD-10-CM

## 2017-04-20 DIAGNOSIS — IMO0002 Reserved for concepts with insufficient information to code with codable children: Secondary | ICD-10-CM

## 2017-04-20 DIAGNOSIS — E1159 Type 2 diabetes mellitus with other circulatory complications: Secondary | ICD-10-CM | POA: Diagnosis not present

## 2017-04-20 DIAGNOSIS — E1165 Type 2 diabetes mellitus with hyperglycemia: Secondary | ICD-10-CM | POA: Diagnosis not present

## 2017-04-20 LAB — SEDIMENTATION RATE: Sed Rate: 32 mm/hr — ABNORMAL HIGH (ref 0–20)

## 2017-04-20 LAB — POCT GLYCOSYLATED HEMOGLOBIN (HGB A1C): Hemoglobin A1C: 6

## 2017-04-20 MED ORDER — INSULIN GLARGINE 100 UNIT/ML SOLOSTAR PEN: 30.0000 [IU] | PEN_INJECTOR | SUBCUTANEOUS | 3 refills | Status: DC

## 2017-04-20 NOTE — Patient Instructions (Addendum)
check your blood sugar twice a day.  vary the time of day when you check, between before the 3 meals, and at bedtime.  also check if you have symptoms of your blood sugar being too high or too low.  please keep a record of the readings and bring it to your next appointment here (or you can bring the meter itself).  You can write it on any piece of paper.  please call us sooner if you have a lot of readings under 100 or over 200.  Please reduce the lantus to 30 units each morning.   Please come back for a follow-up appointment in 3 months.

## 2017-04-20 NOTE — Progress Notes (Signed)
Subjective:    Patient ID: Kurt Aguilar, male    DOB: July 24, 1946, 71 y.o.   MRN: [id number]  HPI Pt returns for f/u of diabetes mellitus: DM type: Insulin-requiring type 2.   Dx'ed: 2951 Complications: polyneuropathy, renal insufficiency, and CAD Therapy: insulin since early 2018. DKA: never.  Severe hypoglycemia: never.  Pancreatitis: never.  Other: he takes chronic prednisone for BOOP; renal insuff limits oral rx options; he declines multiple daily injections.   Interval history: Meter is downloaded today, and the printout is scanned into the record.  It varies from 68-200.  There is no trend throughout the day.  He is off prednisone.  Past Medical History:  Diagnosis Date  . Arthritis   . Atypical migraine   . Diabetes ([place])   . Diverticulitis   . GERD (gastroesophageal reflux disease)   . Gout   . Heart attack ([place])   . Heart disease   . Hyperglycemia   . Hyperlipidemia   . Hypertension   . Osteoporosis   . Sleep apnea     Past Surgical History:  Procedure Laterality Date  . CATARACT EXTRACTION Bilateral   . CORONARY ARTERY BYPASS GRAFT     [place]  . open heart surgery  09/09/1995-1998   5 bypass  . REPAIR KNEE LIGAMENT     right  . TONSILLECTOMY      Social History   Social History  . Marital status: Married    Spouse name: N/A  . Number of children: 0  . Years of education: N/A   Occupational History  . Restaurant owner Retired   Social History Main Topics  . Smoking status: Former Smoker    Packs/day: 1.00    Years: 3.00    Types: Cigars    Quit date: 11/25/2004  . Smokeless tobacco: Never Used     Comment: occas. cigar quit 2006  . Alcohol use 0.6 oz/week    1 Standard drinks or equivalent per week     Comment: rare  . Drug use: No  . Sexual activity: No   Other Topics Concern  . Not on file   Social History Narrative  . No narrative on file    Current Outpatient Prescriptions on File Prior to Visit  Medication Sig  Dispense Refill  . albuterol (VENTOLIN HFA) 108 (90 Base) MCG/ACT inhaler Inhale 2 puffs into the lungs every 4 (four) hours as needed for wheezing or shortness of breath (tight lungs ). 1 Inhaler prn  . allopurinol (ZYLOPRIM) 100 MG tablet Take 1 tablet (100 mg total) by mouth 2 (two) times daily. 180 tablet 1  . aspirin 81 MG tablet 81 mg 2 (two) times daily. Reported on 12/19/2015    . azelastine (OPTIVAR) 0.05 % ophthalmic solution Place 1 drop into both eyes as needed.    Marland Kitchen CINNAMON PO Take 1,000 mg by mouth 2 (two) times daily.    . colchicine 0.6 MG tablet TAKE 1 TABLET BY MOUTH TWICE A DAY WITH A MEAL AS NEEDED FOR ACUTE FLARE OF GOUT 45 tablet 1  . ezetimibe (ZETIA) 10 MG tablet Take 1 tablet (10 mg total) by mouth daily. 90 tablet 3  . famotidine (PEPCID) 20 MG tablet take 1 tablet by mouth at bedtime 30 tablet 2  . furosemide (LASIX) 20 MG tablet take 1 tablet by mouth once daily if needed 30 tablet 3  . Glucosamine HCl (GLUCOSAMINE PO) Take 1,500 mg by mouth 2 (two) times daily. Reported on 12/19/2015    .  glucose blood (PRODIGY NO CODING BLOOD GLUC) test strip Check blood sugar 4 times a day and as directed. Dx E11.65 300 each 5  . Insulin Pen Needle 31G X 8 MM MISC Use with Lantus insulin pen 50 each 2  . Lutein 20 MG CAPS Take 1 capsule by mouth daily. Reported on 12/19/2015    . metoprolol tartrate (LOPRESSOR) 25 MG tablet Take 1 tablet (25 mg total) by mouth 2 (two) times daily. 180 tablet 3  . pantoprazole (PROTONIX) 40 MG tablet take 1 tablet by mouth daily before BREAKFAST 30 tablet 2  . polycarbophil (FIBERCON) 625 MG tablet Take 625 mg by mouth daily. Reported on 12/19/2015    . PRODIGY LANCETS 26G MISC 1 each by Does not apply route daily. To check glucose qd and prn for DM2 (250.00) 100 each 3  . rosuvastatin (CRESTOR) 40 MG tablet Take 1 tablet (40 mg total) by mouth daily. 90 tablet 3  . tadalafil (CIALIS) 20 MG tablet Take 1 tablet (20 mg total) by mouth daily as needed for  erectile dysfunction. 10 tablet 12  . gabapentin (NEURONTIN) 300 MG capsule Take 1 capsule (300 mg total) by mouth 3 (three) times daily. (Patient not taking: Reported on 04/20/2017) 90 capsule 11   Current Facility-Administered Medications on File Prior to Visit  Medication Dose Route Frequency Provider Last Rate Last Dose  . betamethasone acetate-betamethasone sodium phosphate (CELESTONE) injection 3 mg  3 mg Intramuscular Once Edrick Kins, DPM        Allergies  Allergen Reactions  . Oxycodone-Acetaminophen     Stops breathing    Family History  Problem Relation Age of Onset  . Heart disease Mother   . Heart disease Father   . Stroke Father   . Heart attack Son 74  . Kidney disease Neg Hx   . Prostate cancer Neg Hx   . Diabetes Neg Hx     BP 128/62   Pulse 68   Wt 240 lb 3.2 oz (109 kg)   SpO2 95%   BMI 33.98 kg/m   Review of Systems He denies LOC.  He has lost 13 lbs.      Objective:   Physical Exam VITAL SIGNS:  See vs page.  GENERAL: no distress. Pulses: foot pulses are intact bilaterally.   MSK: no deformity of the feet or ankles.  CV: 1+ bilat leg edema.  Skin:  no ulcer on the feet or ankles.  normal color and temp on the feet and ankles.  Old healed surgical scars on both legs (vein harvest).  Neuro: sensation is intact to touch on the feet and ankles.     Lab Results  Component Value Date   HGBA1C 6.0 04/20/2017      Assessment & Plan:  Insulin-requiring type 2 DM, with renal insuff: overcontrolled.   Weight loss, new: we'll follow.   Renal insuff: he is at risk for hypoglycemia.    Patient Instructions  check your blood sugar twice a day.  vary the time of day when you check, between before the 3 meals, and at bedtime.  also check if you have symptoms of your blood sugar being too high or too low.  please keep a record of the readings and bring it to your next appointment here (or you can bring the meter itself).  You can write it on any piece of  paper.  please call us sooner if you have a lot of readings under 100 or over  200.  Please reduce the lantus to 30 units each morning.   Please come back for a follow-up appointment in 3 months.

## 2017-05-01 DIAGNOSIS — Z1211 Encounter for screening for malignant neoplasm of colon: Secondary | ICD-10-CM | POA: Diagnosis not present

## 2017-05-01 DIAGNOSIS — D126 Benign neoplasm of colon, unspecified: Secondary | ICD-10-CM | POA: Diagnosis not present

## 2017-05-05 DIAGNOSIS — D126 Benign neoplasm of colon, unspecified: Secondary | ICD-10-CM | POA: Diagnosis not present

## 2017-05-07 ENCOUNTER — Other Ambulatory Visit: Payer: Self-pay | Admitting: Internal Medicine

## 2017-06-01 DIAGNOSIS — Q612 Polycystic kidney, adult type: Secondary | ICD-10-CM | POA: Diagnosis not present

## 2017-06-01 DIAGNOSIS — R600 Localized edema: Secondary | ICD-10-CM | POA: Diagnosis not present

## 2017-06-01 DIAGNOSIS — N183 Chronic kidney disease, stage 3 (moderate): Secondary | ICD-10-CM | POA: Diagnosis not present

## 2017-06-01 DIAGNOSIS — I10 Essential (primary) hypertension: Secondary | ICD-10-CM | POA: Diagnosis not present

## 2017-06-02 DIAGNOSIS — G4733 Obstructive sleep apnea (adult) (pediatric): Secondary | ICD-10-CM | POA: Diagnosis not present

## 2017-06-19 ENCOUNTER — Ambulatory Visit (INDEPENDENT_AMBULATORY_CARE_PROVIDER_SITE_OTHER): Payer: PPO | Admitting: Podiatry

## 2017-06-19 ENCOUNTER — Ambulatory Visit (INDEPENDENT_AMBULATORY_CARE_PROVIDER_SITE_OTHER): Payer: PPO

## 2017-06-19 ENCOUNTER — Encounter: Payer: Self-pay | Admitting: Podiatry

## 2017-06-19 ENCOUNTER — Ambulatory Visit: Payer: PPO | Admitting: Podiatry

## 2017-06-19 DIAGNOSIS — M109 Gout, unspecified: Secondary | ICD-10-CM | POA: Diagnosis not present

## 2017-06-22 NOTE — Progress Notes (Signed)
  Subjective:  Patient ID: Kurt Aguilar, male    DOB: 1945/12/07,  MRN: [id number]  Chief Complaint  Patient presents with  . Foot Pain    1st MPJ right - red, swollen and painful x 3 days, better today, been taking Tylenol   71 y.o. male returns for the above complaint. He was formerly being treated by Dr. Amalia Hailey for right ankle synovitis and peroneal tendinitis with subacromial Achilles bursitis with noted improvement. No complaint today for this issue.  New complaint of right great toe pain 3 days. Endorses redness swelling and pain. Has been taking Tylenol. Has a history of gout however he states that this does not seem as severe as his other gout attacks.  Objective:  There were no vitals filed for this visit. General AA&O x3. Normal mood and affect.  Vascular Dorsalis pedis and posterior tibial pulses  present 2+ bilaterally  Capillary refill normal to all digits. Pedal hair growth diminished .  Neurologic Epicritic sensation grossly present.  Dermatologic No open lesions. Interspaces clear of maceration.  Nails well groomed and normal in appearance. Right first MPJ with edema and erythema, warmth to touch   Orthopedic: Pain on palpation and pain on range of motion right first MPJ    Radiographs: No acute fractures. No osseous erosions noted to the right first MPJ  Assessment & Plan:  Patient was evaluated and treated and all questions answered.  Gout right first MPJ -Educated on etiology -Discussed taking colchicine or other anti-inflammatory medicine to relieve acute attack. -Injection delivered as below.  Procedure: Joint Injection Location: Right first MPJ joint Skin Prep: Alcohol. Injectate: 0.5 cc 1% lidocaine plain, 0.5 cc dexamethasone phosphate. Disposition: Patient tolerated procedure well. Injection site dressed with a band-aid.

## 2017-06-26 NOTE — Progress Notes (Signed)
Cardiology Office Note  Date:  06/30/2017   ID:  Kurt Aguilar, DOB 07-29-1946, MRN [id number]  PCP:  [name], MD   Chief Complaint  Patient presents with  . other    50mo f/u. Pt states he is doing well. Reviewed meds with pt verbally.    HPI:  71 yo with history of  CAD,  CABG back in 59 in [place], [place],  HTN,  diabetes hyperlipidemia,   gout.  baseline creatinine mildly elevated He lost his son in early 2016 to cardiac arrest Aortic valve murmur presents for routine followup of his coronary artery disease.  Off prednisone, Walks one mile a day, golfing 2 x week Water Aerobics 3 x a week Reports having regional exercise tolerance  Weight down 5 to 6 pounds recently Changed diet  HBA1C 6.0 Dropping insulin, with weight loss  Denies any significant chest pain or shortness of breath concerning for angina Only takes Lasix as needed for ankle swelling  EKG personally reviewed by myself on todays visit shows normal sinus rhythm with rate 51 bpm, right bundle branch block, no change from prior EKG  Her past medical history reviewed Issue in [place] in 10/2016 "thought I was having a stroke" Was seen in the emergency room for confusion BMP performed showing creatinine 1.69, BUN 45 Chlorthalidone was held Repeat BMP early March 2018 showing creatinine 1.69 Baseline creatinine typically 1.4 For some reason he has continued his potassium  h/o reactive airway symptoms, Boop PreviouslyStarted on prednisoneMarch 2017  for shortness of breath, improvement of symptoms,  cough returned after prednisone was discontinued getting close To his baseline Minimal wheezing and cough predisone 5 mg daily, Plan may be to wean the dose Managed by Dr. [name] , Pulmonary  Other major issue was hemoglobin A1c going up to 11 Started On insulin, daily in [place]  Previous CT scan of the chest   showing diffuse coronary calcifications of the LAD, mild calcifications in the  aorta Images show interstitial disease bilaterally, worse on the right  stress test in March of 2011 shows mildly decreased perfusion in the anterior wall at rest with moderately decreased perfusion with stress. He had no significant symptoms with testing.  PMH:   has a past medical history of Arthritis; Atypical migraine; Diabetes ([place]); Diverticulitis; GERD (gastroesophageal reflux disease); Gout; Heart attack ([place]); Heart disease; Hyperglycemia; Hyperlipidemia; Hypertension; Osteoporosis; and Sleep apnea.  PSH:    Past Surgical History:  Procedure Laterality Date  . CATARACT EXTRACTION Bilateral   . CORONARY ARTERY BYPASS GRAFT     [place]  . open heart surgery  09/09/1995-1998   5 bypass  . REPAIR KNEE LIGAMENT     right  . TONSILLECTOMY      Current Outpatient Prescriptions  Medication Sig Dispense Refill  . albuterol (VENTOLIN HFA) 108 (90 Base) MCG/ACT inhaler Inhale 2 puffs into the lungs every 4 (four) hours as needed for wheezing or shortness of breath (tight lungs ). 1 Inhaler prn  . allopurinol (ZYLOPRIM) 100 MG tablet Take 1 tablet (100 mg total) by mouth 2 (two) times daily. 180 tablet 1  . aspirin 81 MG tablet 81 mg 2 (two) times daily. Reported on 12/19/2015    . azelastine (OPTIVAR) 0.05 % ophthalmic solution Place 1 drop into both eyes as needed.    Marland Kitchen CINNAMON PO Take 1,000 mg by mouth 2 (two) times daily.    . colchicine 0.6 MG tablet TAKE 1 TABLET BY MOUTH TWICE A DAY WITH  A MEAL AS NEEDED FOR ACUTE FLARE OF GOUT 45 tablet 1  . ezetimibe (ZETIA) 10 MG tablet Take 1 tablet (10 mg total) by mouth daily. 90 tablet 3  . famotidine (PEPCID) 20 MG tablet take 1 tablet by mouth at bedtime 30 tablet 2  . furosemide (LASIX) 20 MG tablet take 1 tablet by mouth once daily if needed 30 tablet 3  . Glucosamine HCl (GLUCOSAMINE PO) Take 1,500 mg by mouth 2 (two) times daily. Reported on 12/19/2015    . glucose blood (PRODIGY NO CODING BLOOD GLUC) test strip Check blood  sugar 4 times a day and as directed. Dx E11.65 300 each 5  . Insulin Glargine (LANTUS) 100 UNIT/ML Solostar Pen Inject 30 Units into the skin every morning. 30 mL 3  . Insulin Pen Needle 31G X 8 MM MISC Use with Lantus insulin pen 50 each 2  . Lutein 20 MG CAPS Take 1 capsule by mouth daily. Reported on 12/19/2015    . metoprolol tartrate (LOPRESSOR) 25 MG tablet Take 1 tablet (25 mg total) by mouth 2 (two) times daily. 180 tablet 3  . polycarbophil (FIBERCON) 625 MG tablet Take 625 mg by mouth daily. Reported on 12/19/2015    . polyethylene glycol-electrolytes (NULYTELY/GOLYTELY) 420 g solution See admin instructions.  0  . PRODIGY LANCETS 26G MISC 1 each by Does not apply route daily. To check glucose qd and prn for DM2 (250.00) 100 each 3  . rosuvastatin (CRESTOR) 40 MG tablet Take 1 tablet (40 mg total) by mouth daily. 90 tablet 3  . tadalafil (CIALIS) 20 MG tablet Take 1 tablet (20 mg total) by mouth daily as needed for erectile dysfunction. 10 tablet 12   Current Facility-Administered Medications  Medication Dose Route Frequency Provider Last Rate Last Dose  . betamethasone acetate-betamethasone sodium phosphate (CELESTONE) injection 3 mg  3 mg Intramuscular Once Edrick Kins, DPM         Allergies:   Oxycodone-acetaminophen   Social History:  The patient  reports that he quit smoking about 12 years ago. His smoking use included Cigars. He has a 3.00 pack-year smoking history. He has never used smokeless tobacco. He reports that he drinks about 0.6 oz of alcohol per week . He reports that he does not use drugs.   Family History:   family history includes Heart attack (age of onset: 32) in his son; Heart disease in his father and mother; Stroke in his father.    Review of Systems: Review of Systems  Constitutional: Positive for weight loss.  Respiratory: Negative.   Cardiovascular: Negative.   Gastrointestinal: Negative.   Musculoskeletal: Negative.   Neurological: Negative.    Psychiatric/Behavioral: Negative.   All other systems reviewed and are negative. No significant change   PHYSICAL EXAM: VS:  BP 136/70 (BP Location: Left Arm, Patient Position: Sitting, Cuff Size: Normal)   Pulse (!) 51   Ht 5' 10.5" (1.791 m)   Wt 238 lb 8 oz (108.2 kg)   BMI 33.74 kg/m  , BMI Body mass index is 33.74 kg/m.  No significant change compared to previous office visit GEN: Well nourished, well developed, in no acute distress , Obese HEENT: normal  Neck: no JVD, carotid bruits, or masses Cardiac: RRR; no murmurs, rubs, or gallops,no edema  Respiratory:  clear to auscultation bilaterally, normal work of breathing GI: soft, nontender, nondistended, + BS MS: no deformity or atrophy  Skin: warm and dry, no rash Neuro:  Strength and sensation are  intact Psych: euthymic mood, full affect    Recent Labs: 11/11/2016: Pro B Natriuretic peptide (BNP) 24.0 02/04/2017: ALT 16; BUN 25; Creatinine, Ser 1.70; Hemoglobin 14.1; Platelets 255.0; Potassium 4.6; Sodium 142; TSH 3.83    Lipid Panel Lab Results  Component Value Date   CHOL 129 02/04/2017   HDL 34.00 (L) 02/04/2017   LDLCALC 72 02/06/2016   TRIG 216.0 (H) 02/04/2017      Wt Readings from Last 3 Encounters:  06/30/17 238 lb 8 oz (108.2 kg)  04/20/17 240 lb 3.2 oz (109 kg)  04/15/17 243 lb (110.2 kg)       ASSESSMENT AND PLAN:  Pure hypercholesterolemia - Plan: EKG 12-Lead Cholesterol is at goal on the current lipid regimen. No changes to the medications were made. Recent weight loss  Essential hypertension - Plan: EKG 12-Lead Chlorthalidone held in the setting of dehydration, hypotension Blood pressure stable, likely improving with weight loss  Coronary artery disease involving coronary bypass graft of native heart without angina pectoris Currently with no symptoms of angina. No further workup at this time. Continue current medication regimen.  Chronic mild shortness of breath, stable. Stable,  recommended regular exercise program  Controlled type 2 diabetes mellitus with diabetic nephropathy, unspecified long term insulin use status ([place]) - Plan: EKG 12-Lead Hemoglobin A1c of 6 Improving with weight loss Insulin dose is coming down  Morbid obesity due to excess calories ([facility]) in setting of pred rx  - Plan: EKG 12-Lead better with his diet, now doing regular exercise program Stable  Reactive airway disease Off prednisone, reports he is back to his baseline   Total encounter time more than 25 minutes  Greater than 50% was spent in counseling and coordination of care with the patient    Disposition:   F/U  12 months   Orders Placed This Encounter  Procedures  . EKG 12-Lead     Signed, Esmond Plants, M.D., Ph.D. 06/30/2017  [place], [place]

## 2017-06-30 ENCOUNTER — Ambulatory Visit (INDEPENDENT_AMBULATORY_CARE_PROVIDER_SITE_OTHER): Payer: PPO | Admitting: Cardiovascular Disease

## 2017-06-30 ENCOUNTER — Encounter: Payer: Self-pay | Admitting: Cardiovascular Disease

## 2017-06-30 VITALS — BP 136/70 | HR 51 | Ht 70.5 in | Wt 238.5 lb

## 2017-06-30 DIAGNOSIS — I10 Essential (primary) hypertension: Secondary | ICD-10-CM | POA: Diagnosis not present

## 2017-06-30 DIAGNOSIS — E782 Mixed hyperlipidemia: Secondary | ICD-10-CM

## 2017-06-30 DIAGNOSIS — G4733 Obstructive sleep apnea (adult) (pediatric): Secondary | ICD-10-CM | POA: Diagnosis not present

## 2017-06-30 DIAGNOSIS — I25708 Atherosclerosis of coronary artery bypass graft(s), unspecified, with other forms of angina pectoris: Secondary | ICD-10-CM

## 2017-06-30 DIAGNOSIS — R0609 Other forms of dyspnea: Secondary | ICD-10-CM

## 2017-06-30 NOTE — Patient Instructions (Addendum)
Medication Instructions:   Please cut the metoprolol in 1/2 twice a day  Labwork:  No new labs needed  Testing/Procedures:  Call if you would ever like a echocardiogram to look at the aortic valve Suspected very early aortic valve stenosis   Follow-Up: It was a pleasure seeing you in the office today. Please call us if you have new issues that need to be addressed before your next appt.  [phone]  Your physician wants you to follow-up in: 12 months.  You will receive a reminder letter in the mail two months in advance. If you don't receive a letter, please call our office to schedule the follow-up appointment.  If you need a refill on your cardiac medications before your next appointment, please call your pharmacy.

## 2017-07-07 ENCOUNTER — Other Ambulatory Visit: Payer: Self-pay | Admitting: Family Medicine

## 2017-07-07 NOTE — Telephone Encounter (Signed)
Rout do Endo since they manage DM care

## 2017-07-10 ENCOUNTER — Telehealth: Payer: Self-pay | Admitting: Family Medicine

## 2017-07-10 ENCOUNTER — Other Ambulatory Visit: Payer: Self-pay

## 2017-07-10 MED ORDER — INSULIN PEN NEEDLE 31G X 8 MM MISC: 2 refills | Status: DC

## 2017-07-10 NOTE — Telephone Encounter (Unsigned)
Copied from [place] [id number]. Topic: Quick Communication - See Telephone Encounter >> Jul 10, 2017  2:16 PM Neva Seat wrote: Pt stopped by pharmacy and no needles. Pt has only 1 needle left. Needs needles for insulin pen.

## 2017-07-15 ENCOUNTER — Ambulatory Visit: Payer: PPO | Admitting: Podiatry

## 2017-07-17 ENCOUNTER — Other Ambulatory Visit: Payer: Self-pay | Admitting: Internal Medicine

## 2017-07-17 MED ORDER — FAMOTIDINE 20 MG PO TABS: 20.0000 mg | ORAL_TABLET | Freq: Every day | ORAL | 0 refills | Status: DC

## 2017-07-21 ENCOUNTER — Ambulatory Visit: Payer: PPO | Admitting: Endocrinology

## 2017-07-21 ENCOUNTER — Encounter: Payer: Self-pay | Admitting: Endocrinology

## 2017-07-21 VITALS — BP 138/78 | HR 65 | Wt 233.8 lb

## 2017-07-21 DIAGNOSIS — E1165 Type 2 diabetes mellitus with hyperglycemia: Secondary | ICD-10-CM

## 2017-07-21 LAB — POCT GLYCOSYLATED HEMOGLOBIN (HGB A1C): Hemoglobin A1C: 5.9

## 2017-07-21 MED ORDER — INSULIN GLARGINE 100 UNIT/ML SOLOSTAR PEN: 15.0000 [IU] | PEN_INJECTOR | SUBCUTANEOUS | 3 refills | Status: DC

## 2017-07-21 NOTE — Patient Instructions (Addendum)
check your blood sugar twice a day.  vary the time of day when you check, between before the 3 meals, and at bedtime.  also check if you have symptoms of your blood sugar being too high or too low.  please keep a record of the readings and bring it to your next appointment here (or you can bring the meter itself).  You can write it on any piece of paper.  please call us sooner if you have a lot of readings under 100 or over 200.  Please reduce the lantus to 15 units each morning.   Please call if you go back on the steroids.  Please come back for a follow-up appointment in 3 months.

## 2017-07-21 NOTE — Progress Notes (Signed)
Subjective:    Patient ID: Kurt Aguilar, male    DOB: Mar 10, 1946, 71 y.o.   MRN: [id number]  HPI Pt returns for f/u of diabetes mellitus: DM type: Insulin-requiring type 2.   Dx'ed: 1884 Complications: polyneuropathy, renal insufficiency, and CAD Therapy: insulin since early 2018. DKA: never.  Severe hypoglycemia: never.  Pancreatitis: never.  Other: he takes intermitt prednisone for BOOP; renal insuff limits rx options; he declines multiple daily injections.   Interval history: Meter is downloaded today, and the printout is scanned into the record.  It varies from 90-200's.  There is no trend throughout the day.  No recent prednisone. He takes lantus, 20/d Past Medical History:  Diagnosis Date  . Arthritis   . Atypical migraine   . Diabetes ([place])   . Diverticulitis   . GERD (gastroesophageal reflux disease)   . Gout   . Heart attack ([place])   . Heart disease   . Hyperglycemia   . Hyperlipidemia   . Hypertension   . Osteoporosis   . Sleep apnea     Past Surgical History:  Procedure Laterality Date  . CATARACT EXTRACTION Bilateral   . CORONARY ARTERY BYPASS GRAFT     [place]  . open heart surgery  09/09/1995-1998   5 bypass  . REPAIR KNEE LIGAMENT     right  . TONSILLECTOMY      Social History   Socioeconomic History  . Marital status: Married    Spouse name: Not on file  . Number of children: 0  . Years of education: Not on file  . Highest education level: Not on file  Social Needs  . Financial resource strain: Not on file  . Food insecurity - worry: Not on file  . Food insecurity - inability: Not on file  . Transportation needs - medical: Not on file  . Transportation needs - non-medical: Not on file  Occupational History  . Occupation: Armed forces technical officer: RETIRED  Tobacco Use  . Smoking status: Former Smoker    Packs/day: 1.00    Years: 3.00    Pack years: 3.00    Types: Cigars    Last attempt to quit: 11/25/2004    Years  since quitting: 12.6  . Smokeless tobacco: Never Used  . Tobacco comment: occas. cigar quit 2006  Substance and Sexual Activity  . Alcohol use: Yes    Alcohol/week: 0.6 oz    Types: 1 Standard drinks or equivalent per week    Comment: rare  . Drug use: No  . Sexual activity: No  Other Topics Concern  . Not on file  Social History Narrative  . Not on file    Current Outpatient Medications on File Prior to Visit  Medication Sig Dispense Refill  . albuterol (VENTOLIN HFA) 108 (90 Base) MCG/ACT inhaler Inhale 2 puffs into the lungs every 4 (four) hours as needed for wheezing or shortness of breath (tight lungs ). 1 Inhaler prn  . allopurinol (ZYLOPRIM) 100 MG tablet Take 1 tablet (100 mg total) by mouth 2 (two) times daily. 180 tablet 1  . aspirin 81 MG tablet 81 mg 2 (two) times daily. Reported on 12/19/2015    . azelastine (OPTIVAR) 0.05 % ophthalmic solution Place 1 drop into both eyes as needed.    . B-D ULTRAFINE III SHORT PEN 31G X 8 MM MISC use with LANTUS PEN 100 each 2  . CINNAMON PO Take 1,000 mg by mouth 2 (two) times daily.    Marland Kitchen  colchicine 0.6 MG tablet TAKE 1 TABLET BY MOUTH TWICE A DAY WITH A MEAL AS NEEDED FOR ACUTE FLARE OF GOUT 45 tablet 1  . ezetimibe (ZETIA) 10 MG tablet Take 1 tablet (10 mg total) by mouth daily. 90 tablet 3  . famotidine (PEPCID) 20 MG tablet Take 1 tablet (20 mg total) at bedtime by mouth. 30 tablet 0  . furosemide (LASIX) 20 MG tablet take 1 tablet by mouth once daily if needed 30 tablet 3  . Glucosamine HCl (GLUCOSAMINE PO) Take 1,500 mg by mouth 2 (two) times daily. Reported on 12/19/2015    . glucose blood (PRODIGY NO CODING BLOOD GLUC) test strip Check blood sugar 4 times a day and as directed. Dx E11.65 300 each 5  . Insulin Pen Needle 31G X 8 MM MISC Use with Lantus insulin pen 50 each 2  . Lutein 20 MG CAPS Take 1 capsule by mouth daily. Reported on 12/19/2015    . metoprolol tartrate (LOPRESSOR) 25 MG tablet Take 1 tablet (25 mg total) by mouth  2 (two) times daily. 180 tablet 3  . polycarbophil (FIBERCON) 625 MG tablet Take 625 mg by mouth daily. Reported on 12/19/2015    . polyethylene glycol-electrolytes (NULYTELY/GOLYTELY) 420 g solution See admin instructions.  0  . PRODIGY LANCETS 26G MISC 1 each by Does not apply route daily. To check glucose qd and prn for DM2 (250.00) 100 each 3  . rosuvastatin (CRESTOR) 40 MG tablet Take 1 tablet (40 mg total) by mouth daily. 90 tablet 3  . tadalafil (CIALIS) 20 MG tablet Take 1 tablet (20 mg total) by mouth daily as needed for erectile dysfunction. 10 tablet 12   Current Facility-Administered Medications on File Prior to Visit  Medication Dose Route Frequency Provider Last Rate Last Dose  . betamethasone acetate-betamethasone sodium phosphate (CELESTONE) injection 3 mg  3 mg Intramuscular Once Edrick Kins, DPM        Allergies  Allergen Reactions  . Oxycodone-Acetaminophen     Stops breathing    Family History  Problem Relation Age of Onset  . Heart disease Mother   . Heart disease Father   . Stroke Father   . Heart attack Son 48  . Kidney disease Neg Hx   . Prostate cancer Neg Hx   . Diabetes Neg Hx     BP 138/78 (BP Location: Left Arm, Patient Position: Sitting, Cuff Size: Normal)   Pulse 65   Wt 233 lb 12.8 oz (106.1 kg)   SpO2 96%   BMI 33.07 kg/m    Review of Systems He denies hypoglycemia    Objective:   Physical Exam VITAL SIGNS:  See vs page.  GENERAL: no distress. Pulses: foot pulses are intact bilaterally.   MSK: no deformity of the feet or ankles.  CV: trace bilat leg edema.  Skin:  no ulcer on the feet or ankles.  normal color and temp on the feet and ankles.  Old healed surgical scars on both legs (vein harvest).  Neuro: sensation is intact to touch on the feet and ankles.     Lab Results  Component Value Date   CREATININE 1.70 (H) 02/04/2017   BUN 25 (H) 02/04/2017   NA 142 02/04/2017   K 4.6 02/04/2017   CL 105 02/04/2017   CO2 31  02/04/2017   Lab Results  Component Value Date   HGBA1C 5.9 07/21/2017      Assessment & Plan:  Insulin-requiring type 2 DM: overcontrolled BOOP:  he takes intermitt steroids Renal failure.  He declines fructosamine, at least for now.    Patient Instructions  check your blood sugar twice a day.  vary the time of day when you check, between before the 3 meals, and at bedtime.  also check if you have symptoms of your blood sugar being too high or too low.  please keep a record of the readings and bring it to your next appointment here (or you can bring the meter itself).  You can write it on any piece of paper.  please call us sooner if you have a lot of readings under 100 or over 200.  Please reduce the lantus to 15 units each morning.   Please call if you go back on the steroids.  Please come back for a follow-up appointment in 3 months.

## 2017-07-28 ENCOUNTER — Other Ambulatory Visit: Payer: Self-pay | Admitting: Cardiovascular Disease

## 2017-07-29 ENCOUNTER — Encounter: Payer: Self-pay | Admitting: Internal Medicine

## 2017-07-29 ENCOUNTER — Ambulatory Visit: Payer: PPO | Admitting: Internal Medicine

## 2017-07-29 ENCOUNTER — Ambulatory Visit (INDEPENDENT_AMBULATORY_CARE_PROVIDER_SITE_OTHER)
Admission: RE | Admit: 2017-07-29 | Discharge: 2017-07-29 | Disposition: A | Discharge status: Home / Self Care | Payer: PPO | Source: Ambulatory Visit | Attending: Internal Medicine | Admitting: Internal Medicine

## 2017-07-29 VITALS — BP 124/62 | HR 63 | Temp 97.4°F | Ht 70.5 in | Wt 231.8 lb

## 2017-07-29 DIAGNOSIS — R918 Other nonspecific abnormal finding of lung field: Secondary | ICD-10-CM | POA: Diagnosis not present

## 2017-07-29 DIAGNOSIS — R05 Cough: Secondary | ICD-10-CM | POA: Diagnosis not present

## 2017-07-29 DIAGNOSIS — J069 Acute upper respiratory infection, unspecified: Secondary | ICD-10-CM | POA: Diagnosis not present

## 2017-07-29 MED ORDER — AZITHROMYCIN 250 MG PO TABS: ORAL_TABLET | ORAL | 0 refills | Status: DC

## 2017-07-29 NOTE — Progress Notes (Signed)
Subjective:    Patient ID: Kurt Aguilar, male    DOB: Sep 28, 1945,   MRN: [id number]    Brief patient profile:  36 yowm never cigarette  Smoker(just some cigars)  after cabg in [place] started noting recurrent winter cough regardless of where located in [place] ([place]/ [place]) with typical acute episode in [place] in Feb 2017  This time cough  more dry less severe than previous >  abx and pred > improved but did not resolve and worse when stopped it so restarted pred/abx > dx as pneumonia with variable as dz > referred to pulmonary clinic 12/19/2015 by [name] with 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   History of Present Illness  12/19/2015 1st [facility] office visit/ [facility]   Chief Complaint  Patient presents with  . Pulmonary Consult    Referred by [name]. Pt c/o cough x 4 wks- non prod and worse in the evening and when he lies down. Talking and exertion are things that trigger the cough. He has been on pred x 2 and both times cough resolved and immediately returns once done with med. He also c/o SOB- gets winded just walking from room to room at home.   while on prednisone still some weak and sob but better cough and last dose at least one week and worse overall since off it assoc with sob room to room and weakness/ excess/ purulent sputum or mucus plugs / no unusual exp / no ctd.  Has new CPAP machine fall 2016  rec  Omeprazole 20 mg Take 30- 60 min before your first and last meals of the day  Prednisone 10  X 2 each day until 100% better then 10 mg daily  Dx is Brochilitis obliterans with organizing pneumonia vs eosiniphic pneumonia most likely diagnosis GERD  Diet       02/01/2016  f/u ov/Wert re: boop tapered pred to 10 mg as 01/28/16  Chief Complaint  Patient presents with  . Follow-up    pt states he is much improved since last office visit.  SOB has resolved.    Not limited by breathing from desired activities / concerned about wt gain from pred   rec Prednisone 10 mg with bfast x 2 week then 5 mg x 2 weeks then 5 mg even days x 2 weeks and stop  If breathing/cough worse or nausea resume previous dose    09/11/2016  f/u ov/Wert re: BOOP/uacs  on pred 10/5  prilosec 20 mg ac and hs  Chief Complaint  Patient presents with  . Follow-up    Increased SOB for the past 3-4 days. He gets SOB with exertion such as getting dressed or walking accross the room.  He has also started coughing more "feels like I burned my lungs"- prod with white sputum.     was doing ok on 10/5 in terms of breathing but worse gradually x one week p several weeks on the 10/5 dose  Cough some better but still needing freq tessalon on gabapentin 100 tid  rec Prednisone 10 mg  2 daily until better then 1 daily x 2 weeks then 10 alternating with 5 until return Prilosec should be 20 mg Take 30- 60 min before your first and last meals of the day  Increase gabapentin to 300 mg three times daily   .       02/10/2017  f/u ov/Wert re:  PF / boop down to 10 mg one half qod and maint on  gabapentin 300 tid for UACS Chief Complaint  Patient presents with  . Follow-up    Pt states "doing great"- doing well on the pred 5 mg every other day.    no change ex tol with taper / no cough/ no nausea even on days off  rec Prednisone 5mg  one half every other day x 2 weeks and then stop -      04/15/2017  f/u ov/Wert re:  PF/ boop / ? Irritable larynx  Chief Complaint  Patient presents with  . Follow-up    Pt states developed a cold mid July 2018 while traveling to [place]. He is feeling better now and not coughing much. No new co's today.   walked all over [place] up hills/ in heat no problem with sob   Still occ throat clearing better on gabapentin 300 tid and gerd rx / daytime only  rec Once the protonix runs out,  Ok to try omeprazole 20 mg before bfrast and pepcid at bedtime until you see [name]  Once you run out of gabapentin ok to leave it off and call if refills needed        07/29/2017  Acute ov/Wert re: PF s/p boop with recurrent cough in setting of ? URI Chief Complaint  Patient presents with  . Acute Visit    Pt states had fever and chills 3 days ago- then started developed non prod cough. He states he noticed some SOB when he went for his walk recently.    good ex tol/ no cough on just pepcid at hs and no gabapentin  Flu shot last month Stuffy in head for several weeks > nettipot > yellow  Then acutely ill 11/18 bad chills/ fever to 101.4  then dry cough s sore throat    No obvious day to day or daytime variability or assoc excess/ purulent sputum or mucus plugs or hemoptysis or cp or chest tightness, subjective wheeze or overt sinus or hb symptoms. No unusual exp hx or h/o childhood pna/ asthma or knowledge of premature birth.  Sleeping ok flat without nocturnal  or early am exacerbation  of respiratory  c/o's or need for noct saba. Also denies any obvious fluctuation of symptoms with weather or environmental changes or other aggravating or alleviating factors except as outlined above   Current Allergies, Complete Past Medical History, Past Surgical History, Family History, and Social History were reviewed in [employer] record.  ROS  The following are not active complaints unless bolded Hoarseness, sore throat, dysphagia, dental problems, itching, sneezing,  nasal congestion or discharge of excess mucus or purulent secretions, ear ache,   fever, chills, sweats, unintended wt loss or wt gain, classically pleuritic or exertional cp,  orthopnea pnd or leg swelling, presyncope, palpitations, abdominal pain, anorexia, nausea, vomiting, diarrhea  or change in bowel habits or change in bladder habits, change in stools or change in urine, dysuria, hematuria,  rash, arthralgias, visual complaints, headache, numbness, weakness or ataxia or problems with walking or coordination,  change in mood/affect or memory.        Current Meds   Medication Sig  . albuterol (VENTOLIN HFA) 108 (90 Base) MCG/ACT inhaler Inhale 2 puffs into the lungs every 4 (four) hours as needed for wheezing or shortness of breath (tight lungs ).  Marland Kitchen allopurinol (ZYLOPRIM) 100 MG tablet Take 1 tablet (100 mg total) by mouth 2 (two) times daily.  Marland Kitchen aspirin 81 MG tablet 81 mg 2 (two) times daily. Reported  on 12/19/2015  . azelastine (OPTIVAR) 0.05 % ophthalmic solution Place 1 drop into both eyes as needed.  . B-D ULTRAFINE III SHORT PEN 31G X 8 MM MISC use with LANTUS PEN  . CINNAMON PO Take 1,000 mg by mouth 2 (two) times daily.  . colchicine 0.6 MG tablet TAKE 1 TABLET BY MOUTH TWICE A DAY WITH A MEAL AS NEEDED FOR ACUTE FLARE OF GOUT  . ezetimibe (ZETIA) 10 MG tablet Take 1 tablet (10 mg total) by mouth daily.  . famotidine (PEPCID) 20 MG tablet Take 1 tablet (20 mg total) at bedtime by mouth.  . furosemide (LASIX) 20 MG tablet take 1 tablet by mouth once daily if needed  . Glucosamine HCl (GLUCOSAMINE PO) Take 1,500 mg by mouth 2 (two) times daily. Reported on 12/19/2015  . glucose blood (PRODIGY NO CODING BLOOD GLUC) test strip Check blood sugar 4 times a day and as directed. Dx E11.65  . Insulin Glargine (LANTUS) 100 UNIT/ML Solostar Pen Inject 15 Units every morning into the skin.  . Insulin Pen Needle 31G X 8 MM MISC Use with Lantus insulin pen  . Lutein 20 MG CAPS Take 1 capsule by mouth daily. Reported on 12/19/2015  . metoprolol tartrate (LOPRESSOR) 25 MG tablet Take 12.5 mg by mouth 2 (two) times daily.  . polycarbophil (FIBERCON) 625 MG tablet Take 625 mg by mouth daily. Reported on 12/19/2015  . polyethylene glycol-electrolytes (NULYTELY/GOLYTELY) 420 g solution See admin instructions.  Marland Kitchen PRODIGY LANCETS 26G MISC 1 each by Does not apply route daily. To check glucose qd and prn for DM2 (250.00)  . rosuvastatin (CRESTOR) 40 MG tablet Take 1 tablet (40 mg total) by mouth daily.  . tadalafil (CIALIS) 20 MG tablet Take 1 tablet (20 mg total) by  mouth daily as needed for erectile dysfunction.               Objective:   Physical Exam  Pleasant  Wm nad     07/29/2017      232  04/15/2017          243  02/10/2017          250  12/19/2016        250 11/11/2016          258  09/11/2016          252  07/29/2016      244  05/16/2016          238  02/01/2016        235  01/07/2016          228   12/19/15 229 lb 4 oz (103.987 kg)  12/19/15 229 lb 4 oz (103.987 kg)  12/19/15 227 lb (102.967 kg)    Vital signs reviewed/  Note sats 98% RA on arrival     LUNGS: no acc muscle use,  Nl contour chest  - good bs bilaterally / No significant insp crackles or cough on insp  CV:  RRR  no s3 or murmur or increase in P2,  Trace Sym  Bilateral ankle edema     HEENT: nl dentition,   and oropharynx. Nl external ear canals without cough reflex - moderate bilateral non-specific turbinate edema     NECK :  without JVD/Nodes/TM/ nl carotid upstrokes bilaterally   LUNGS: no acc muscle use,  Nl contour chest which is clear to A and P bilaterally without cough on insp or exp maneuvers   CV:  RRR  no  s3 or murmur or increase in P2, and  Trace sym bilat  pedal edema   ABD:  soft and nontender with nl inspiratory excursion in the supine position. No bruits or organomegaly appreciated, bowel sounds nl  MS:  Nl gait/ ext warm without deformities, calf tenderness, cyanosis or clubbing No obvious joint restrictions   SKIN: warm and dry without lesions    NEURO:  alert, approp, nl sensorium with  no motor or cerebellar deficits apparent.          CXR PA and Lateral:   07/29/2017 :    I personally reviewed images and   impression as follows:   No acute change              Assessment & Plan:

## 2017-07-29 NOTE — Patient Instructions (Addendum)
zpak   At onset of cough for any reason:  omerprazole 40 mg before bfast and pepcid 20 mg at bedtime  For cough > mucinex dm up to 1200 mg every 12 hours as needed   For drainage / throat tickle try take CHLORPHENIRAMINE  4 mg - take one every 4 hours as needed - available over the counter- may cause drowsiness so start with just a bedtime dose or two and see how you tolerate it before trying in daytime    GERD (REFLUX)  is an extremely common cause of respiratory symptoms just like yours , many times with no obvious heartburn at all.    It can be treated with medication, but also with lifestyle changes including elevation of the head of your bed (ideally with 6 inch  bed blocks),  Smoking cessation, avoidance of late meals, excessive alcohol, and avoid fatty foods, chocolate, peppermint, colas, red wine, and acidic juices such as orange juice.  NO MINT OR MENTHOL PRODUCTS SO NO COUGH DROPS   USE SUGARLESS CANDY INSTEAD (Jolley ranchers or Stover's or Life Savers) or even ice chips will also do - the key is to swallow to prevent all throat clearing. NO OIL BASED VITAMINS - use powdered substitutes.    Please remember to go to the  x-ray department downstairs in the basement  for your tests - we will call you with the results when they are available.   Follow up as needed

## 2017-07-31 ENCOUNTER — Encounter: Payer: Self-pay | Admitting: Internal Medicine

## 2017-07-31 NOTE — Assessment & Plan Note (Signed)
Baseline PFTs 03/18/10  FVC  3.59 (80%) with ERV 67% and dlco 75 corrects to 106 Onset of symptoms feb 2017 with "photo neg pumonary edema pattern" - Allergy profile 12/19/15 >  Eos 0.2 /  IgE  89/ Pos Cat RAST only  12/19/2015  ESR 93 > started on prednisone 20 mg daily  HSP profile 12/19/15 >  Neg  - 01/07/2016 ESR down to 18 but only 30% improved symptomatically > rec continue 20 mg daily until better then 10 mg daily  - 02/01/2016 rec taper off x 6 weeks then ov > 05/16/2016 ESR = 58> restarted pred 06/03/16  - Collagen Vasc dz screen 07/29/2016  Neg  - PFT's  11/11/2016  FEV1 1.91 (56 % ) ratio 72  p 7 % improvement from saba p nothing prior to study with DLCO  42/43 % corrects to 77 % for alv volume but ERV 35%  - ESR  20 on 10 a/w 15 as  Of 11/11/2016 > rec try 10 mg daily  - Prednisone 5 mg daily as of 12/15/16 > taper to 5 mg qod by 02/10/2017 - 02/10/2017 ESR =  28  rec taper off x 2 weeks > no flare - 04/20/2017 ESR = 32 off pred since 02/2017   No evidence of a flare of boop or any pna at this point > rx zpak for uri, no steroids

## 2017-07-31 NOTE — Assessment & Plan Note (Signed)
Onset of symptoms 07/26/17 > rec zpak and max gerd rx short term   Of the three most common causes of  Sub-acute or recurrent or chronic cough, only one (GERD)  can actually contribute to/ trigger  the other two (asthma and post nasal drip syndrome)  and perpetuate the cylce of cough.  While not intuitively obvious, many patients with chronic low grade reflux do not cough until there is a primary insult that disturbs the protective epithelial barrier and exposes sensitive nerve endings.   This is typically viral is is likely the case here  but can be direct physical injury such as with an endotracheal tube.   The point is that once this occurs, it is difficult to eliminate the cycle  using anything but a maximally effective acid suppression regimen at least in the short run, accompanied by an appropriate diet to address non acid GERD  And control the cough with mucinex dm   I had an extended discussion with the patient reviewing all relevant studies completed to date and  lasting 15 to 20 minutes of a 25 minute acute office visit    Each maintenance medication was reviewed in detail including most importantly the difference between maintenance and prns and under what circumstances the prns are to be triggered using an action plan format that is not reflected in the computer generated alphabetically organized AVS.    Please see AVS for specific instructions unique to this visit that I personally wrote and verbalized to the the pt in detail and then reviewed with pt  by my nurse highlighting any  changes in therapy recommended at today's visit to their plan of care.

## 2017-08-13 ENCOUNTER — Other Ambulatory Visit: Payer: Self-pay | Admitting: Family Medicine

## 2017-09-14 ENCOUNTER — Other Ambulatory Visit: Payer: Self-pay | Admitting: *Deleted

## 2017-09-18 ENCOUNTER — Telehealth: Payer: Self-pay | Admitting: Internal Medicine

## 2017-09-18 MED ORDER — AZITHROMYCIN 250 MG PO TABS: ORAL_TABLET | ORAL | 0 refills | Status: DC

## 2017-09-18 NOTE — Telephone Encounter (Signed)
Pt c/o increased sob, prod cough with yellow/green mucus X2-3 days.    Denies fever, sinus congestion, body aches, chest pain.  Pt taking mucinex to help with s/s- requesting a zpak.    Pt uses [facility] on [address] in [place]   MW please advise on recs.  Thanks!

## 2017-09-18 NOTE — Telephone Encounter (Signed)
Spoke with patient. He is aware of MW's recs. Will go ahead and call in medication. Nothing else needed at time of call.

## 2017-09-18 NOTE — Telephone Encounter (Signed)
z-pak 

## 2017-09-21 DIAGNOSIS — H353131 Nonexudative age-related macular degeneration, bilateral, early dry stage: Secondary | ICD-10-CM | POA: Diagnosis not present

## 2017-09-21 DIAGNOSIS — E119 Type 2 diabetes mellitus without complications: Secondary | ICD-10-CM | POA: Diagnosis not present

## 2017-09-24 ENCOUNTER — Telehealth: Payer: Self-pay | Admitting: Internal Medicine

## 2017-09-24 MED ORDER — FAMOTIDINE 20 MG PO TABS: 20.0000 mg | ORAL_TABLET | Freq: Every day | ORAL | 0 refills | Status: DC

## 2017-09-24 NOTE — Telephone Encounter (Signed)
Spoke with pt. He is needing a 90 day supply of Pepcid refilled. Rx has been sent in. Nothing further was needed.

## 2017-11-10 ENCOUNTER — Ambulatory Visit: Payer: PPO | Admitting: Endocrinology

## 2017-11-10 ENCOUNTER — Encounter: Payer: Self-pay | Admitting: Endocrinology

## 2017-11-10 VITALS — BP 108/66 | HR 60 | Temp 97.7°F | Wt 222.0 lb

## 2017-11-10 DIAGNOSIS — E11649 Type 2 diabetes mellitus with hypoglycemia without coma: Secondary | ICD-10-CM | POA: Diagnosis not present

## 2017-11-10 LAB — POCT GLYCOSYLATED HEMOGLOBIN (HGB A1C): Hemoglobin A1C: 6

## 2017-11-10 NOTE — Patient Instructions (Addendum)
check your blood sugar twice a day.  vary the time of day when you check, between before the 3 meals, and at bedtime.  also check if you have symptoms of your blood sugar being too high or too low.  please keep a record of the readings and bring it to your next appointment here (or you can bring the meter itself).  You can write it on any piece of paper.  please call us sooner if you have a lot of readings under 100 or over 200.  Please stop taking the lantus.   Please call if you go back on the steroids.  Please come back for a follow-up appointment in 3 months.

## 2017-11-10 NOTE — Progress Notes (Signed)
Subjective:    Patient ID: Kurt Aguilar, male    DOB: 09/30/1945, 72 y.o.   MRN: [id number]  HPI Pt returns for f/u of diabetes mellitus: DM type: Insulin-requiring type 2.   Dx'ed: 9233 Complications: polyneuropathy, renal insufficiency, and CAD Therapy: insulin since early 2018. DKA: never.  Severe hypoglycemia: never.  Pancreatitis: never.  Other: he takes intermitt prednisone for BOOP; renal insuff limits rx options; he declines multiple daily injections.   Interval history: no cbg record, but states cbg's vary from 100-160  There is no trend throughout the day.  No recent prednisone. He takes lantus, 10/d.   Past Medical History:  Diagnosis Date  . Arthritis   . Atypical migraine   . Diabetes ([place])   . Diverticulitis   . GERD (gastroesophageal reflux disease)   . Gout   . Heart attack ([place])   . Heart disease   . Hyperglycemia   . Hyperlipidemia   . Hypertension   . Osteoporosis   . Sleep apnea     Past Surgical History:  Procedure Laterality Date  . CATARACT EXTRACTION Bilateral   . CORONARY ARTERY BYPASS GRAFT     [place]  . open heart surgery  09/09/1995-1998   5 bypass  . REPAIR KNEE LIGAMENT     right  . TONSILLECTOMY      Social History   Socioeconomic History  . Marital status: Married    Spouse name: Not on file  . Number of children: 0  . Years of education: Not on file  . Highest education level: Not on file  Social Needs  . Financial resource strain: Not on file  . Food insecurity - worry: Not on file  . Food insecurity - inability: Not on file  . Transportation needs - medical: Not on file  . Transportation needs - non-medical: Not on file  Occupational History  . Occupation: Armed forces technical officer: RETIRED  Tobacco Use  . Smoking status: Former Smoker    Packs/day: 1.00    Years: 3.00    Pack years: 3.00    Types: Cigars    Last attempt to quit: 11/25/2004    Years since quitting: 12.9  . Smokeless tobacco:  Never Used  . Tobacco comment: occas. cigar quit 2006  Substance and Sexual Activity  . Alcohol use: Yes    Alcohol/week: 0.6 oz    Types: 1 Standard drinks or equivalent per week    Comment: rare  . Drug use: No  . Sexual activity: No  Other Topics Concern  . Not on file  Social History Narrative  . Not on file    Current Outpatient Medications on File Prior to Visit  Medication Sig Dispense Refill  . albuterol (VENTOLIN HFA) 108 (90 Base) MCG/ACT inhaler Inhale 2 puffs into the lungs every 4 (four) hours as needed for wheezing or shortness of breath (tight lungs ). 1 Inhaler prn  . allopurinol (ZYLOPRIM) 100 MG tablet take 1 tablet by mouth twice a day 180 tablet 0  . aspirin 81 MG tablet 81 mg 2 (two) times daily. Reported on 12/19/2015    . azelastine (OPTIVAR) 0.05 % ophthalmic solution Place 1 drop into both eyes as needed.    Marland Kitchen CINNAMON PO Take 1,000 mg by mouth 2 (two) times daily.    . colchicine 0.6 MG tablet TAKE 1 TABLET BY MOUTH TWICE A DAY WITH A MEAL AS NEEDED FOR ACUTE FLARE OF GOUT 45 tablet 1  .  ezetimibe (ZETIA) 10 MG tablet Take 1 tablet (10 mg total) by mouth daily. 90 tablet 3  . famotidine (PEPCID) 20 MG tablet Take 1 tablet (20 mg total) by mouth at bedtime. 90 tablet 0  . furosemide (LASIX) 20 MG tablet take 1 tablet by mouth once daily if needed 30 tablet 3  . Glucosamine HCl (GLUCOSAMINE PO) Take 1,500 mg by mouth 2 (two) times daily. Reported on 12/19/2015    . glucose blood (PRODIGY NO CODING BLOOD GLUC) test strip Check blood sugar 4 times a day and as directed. Dx E11.65 300 each 5  . Lutein 20 MG CAPS Take 1 capsule by mouth daily. Reported on 12/19/2015    . metoprolol tartrate (LOPRESSOR) 25 MG tablet Take 12.5 mg by mouth 2 (two) times daily.    . polycarbophil (FIBERCON) 625 MG tablet Take 625 mg by mouth daily. Reported on 12/19/2015    . rosuvastatin (CRESTOR) 40 MG tablet Take 1 tablet (40 mg total) by mouth daily. 90 tablet 3  . tadalafil (CIALIS)  20 MG tablet Take 1 tablet (20 mg total) by mouth daily as needed for erectile dysfunction. 10 tablet 12   Current Facility-Administered Medications on File Prior to Visit  Medication Dose Route Frequency Provider Last Rate Last Dose  . betamethasone acetate-betamethasone sodium phosphate (CELESTONE) injection 3 mg  3 mg Intramuscular Once Edrick Kins, DPM        Allergies  Allergen Reactions  . Oxycodone-Acetaminophen     Stops breathing    Family History  Problem Relation Age of Onset  . Heart disease Mother   . Heart disease Father   . Stroke Father   . Heart attack Son 52  . Kidney disease Neg Hx   . Prostate cancer Neg Hx   . Diabetes Neg Hx     BP 108/66 (BP Location: Left Arm, Patient Position: Sitting, Cuff Size: Large)   Pulse 60   Temp 97.7 F (36.5 C) (Oral)   Wt 222 lb (100.7 kg)   SpO2 95%   BMI 31.40 kg/m    Review of Systems He denies hypoglycemia    Objective:   Physical Exam VITAL SIGNS:  See vs page.  GENERAL: no distress. Pulses: foot pulses are intact bilaterally.   MSK: no deformity of the feet or ankles.  CV: 1+ bilat leg edema.  Skin:  no ulcer on the feet or ankles.  normal color and temp on the feet and ankles.  Old healed surgical scars on both legs (vein harvest).  Neuro: sensation is intact to touch on the feet and ankles.     Lab Results  Component Value Date   HGBA1C 6.0 11/10/2017   Lab Results  Component Value Date   CREATININE 1.70 (H) 02/04/2017   BUN 25 (H) 02/04/2017   NA 142 02/04/2017   K 4.6 02/04/2017   CL 105 02/04/2017   CO2 31 02/04/2017       Assessment & Plan:  Type 2 DM: overcontrolled Renal insuff: this limits rx options BOOP: he may need to resume prednisone at some point, and therefore need to resume insulin.     Patient Instructions  check your blood sugar twice a day.  vary the time of day when you check, between before the 3 meals, and at bedtime.  also check if you have symptoms of your blood  sugar being too high or too low.  please keep a record of the readings and bring it to your  next appointment here (or you can bring the meter itself).  You can write it on any piece of paper.  please call us sooner if you have a lot of readings under 100 or over 200.  Please stop taking the lantus.   Please call if you go back on the steroids.  Please come back for a follow-up appointment in 3 months.

## 2017-11-11 ENCOUNTER — Encounter: Payer: Self-pay | Admitting: Family Medicine

## 2017-11-11 ENCOUNTER — Ambulatory Visit (INDEPENDENT_AMBULATORY_CARE_PROVIDER_SITE_OTHER): Payer: PPO | Admitting: Family Medicine

## 2017-11-11 VITALS — BP 128/70 | HR 68 | Temp 97.6°F | Wt 222.5 lb

## 2017-11-11 DIAGNOSIS — R05 Cough: Secondary | ICD-10-CM

## 2017-11-11 DIAGNOSIS — R059 Cough, unspecified: Secondary | ICD-10-CM

## 2017-11-11 MED ORDER — BENZONATATE 100 MG PO CAPS: 100.0000 mg | ORAL_CAPSULE | Freq: Three times a day (TID) | ORAL | 0 refills | Status: DC | PRN

## 2017-11-11 NOTE — Progress Notes (Signed)
Subjective:    Patient ID: Kurt Aguilar, male    DOB: 18-Mar-1946, 72 y.o.   MRN: [id number]  HPI This is a 72 yo male, accompanied by his wife, who presents today with cough x several weeks. Was in [place] when he got sick, started as slight cough. Had low fever. Increasing intensity of cough, non productive, feels like there is something in his chest that he can't get out. No wheeze or SOB. Clear nasal drainage, no ear pain, occasionally throat sore in evenings. Doesn't feel poorly.  Last fever (99.6) was 3 days ago.  Was taking Mucinex, (none over lats couple of days) with some relief. Has been using albuterol with some relief. None today.  Saw Dr. Loanne Drilling yesterday and was able to go off insulin.  He has a history of BOOP and sees Dr. Shyrl Numbers. Has had upper airway cough syndrome in past.   Past Medical History:  Diagnosis Date  . Arthritis   . Atypical migraine   . Diabetes ([place])   . Diverticulitis   . GERD (gastroesophageal reflux disease)   . Gout   . Heart attack ([place])   . Heart disease   . Hyperglycemia   . Hyperlipidemia   . Hypertension   . Osteoporosis   . Sleep apnea    Past Surgical History:  Procedure Laterality Date  . CATARACT EXTRACTION Bilateral   . CORONARY ARTERY BYPASS GRAFT     [place]  . open heart surgery  09/09/1995-1998   5 bypass  . REPAIR KNEE LIGAMENT     right  . TONSILLECTOMY     Family History  Problem Relation Age of Onset  . Heart disease Mother   . Heart disease Father   . Stroke Father   . Heart attack Son 26  . Kidney disease Neg Hx   . Prostate cancer Neg Hx   . Diabetes Neg Hx    Social History   Tobacco Use  . Smoking status: Former Smoker    Packs/day: 1.00    Years: 3.00    Pack years: 3.00    Types: Cigars    Last attempt to quit: 11/25/2004    Years since quitting: 12.9  . Smokeless tobacco: Never Used  . Tobacco comment: occas. cigar quit 2006  Substance Use Topics  . Alcohol use: Yes   Alcohol/week: 0.6 oz    Types: 1 Standard drinks or equivalent per week    Comment: rare  . Drug use: No      Review of Systems Per HPI    Objective:   Physical Exam  Constitutional: He is oriented to person, place, and time. He appears well-developed and well-nourished. No distress.  HENT:  Head: Normocephalic and atraumatic.  Right Ear: Tympanic membrane, external ear and ear canal normal.  Left Ear: Tympanic membrane, external ear and ear canal normal.  Nose: Nose normal.  Mouth/Throat: Uvula is midline, oropharynx is clear and moist and mucous membranes are normal.  Eyes: Conjunctivae are normal.  Neck: Normal range of motion. Neck supple.  Cardiovascular: Normal rate, regular rhythm and normal heart sounds.  Pulmonary/Chest: Effort normal and breath sounds normal. No respiratory distress. He has no wheezes. He has no rales.  Lymphadenopathy:    He has no cervical adenopathy.  Neurological: He is alert and oriented to person, place, and time.  Skin: Skin is warm and dry. He is not diaphoretic.  Psychiatric: He has a normal mood and affect. His behavior is normal.  Judgment and thought content normal.  Vitals reviewed.     BP 128/70   Pulse 68   Temp 97.6 F (36.4 C) (Oral)   Wt 222 lb 8 oz (100.9 kg)   SpO2 98%   BMI 31.47 kg/m  Wt Readings from Last 3 Encounters:  11/11/17 222 lb 8 oz (100.9 kg)  11/10/17 222 lb (100.7 kg)  07/29/17 231 lb 12.8 oz (105.1 kg)       Assessment & Plan:  1. Cough - Provided written and verbal information regarding diagnosis and treatment. No history or PE concerning for bacterial etiology - RTC precautions reviewed - benzonatate (TESSALON) 100 MG capsule; Take 1-2 capsules (100-200 mg total) by mouth 3 (three) times daily as needed.  Dispense: 30 capsule; Refill: 0 -  Patient Instructions  Good to see you today  Please resume your Mucinex twice a day  Use your albuterol inhaler every 4-6 hours while awake today and tomorrow  then as needed  If any worsening, fever or increased sputum production, please call the office  Clarene Reamer, FNP-BC  [facility] [place]  11/11/2017 9:04 PM

## 2017-11-11 NOTE — Patient Instructions (Signed)
Good to see you today  Please resume your Mucinex twice a day  Use your albuterol inhaler every 4-6 hours while awake today and tomorrow then as needed  If any worsening, fever or increased sputum production, please call the office

## 2017-11-30 DIAGNOSIS — E1129 Type 2 diabetes mellitus with other diabetic kidney complication: Secondary | ICD-10-CM | POA: Diagnosis not present

## 2017-11-30 DIAGNOSIS — I10 Essential (primary) hypertension: Secondary | ICD-10-CM | POA: Diagnosis not present

## 2017-11-30 DIAGNOSIS — Q612 Polycystic kidney, adult type: Secondary | ICD-10-CM | POA: Diagnosis not present

## 2017-11-30 DIAGNOSIS — N183 Chronic kidney disease, stage 3 (moderate): Secondary | ICD-10-CM | POA: Diagnosis not present

## 2017-12-02 DIAGNOSIS — G4733 Obstructive sleep apnea (adult) (pediatric): Secondary | ICD-10-CM | POA: Diagnosis not present

## 2017-12-15 ENCOUNTER — Other Ambulatory Visit: Payer: Self-pay | Admitting: Internal Medicine

## 2017-12-23 ENCOUNTER — Telehealth: Payer: Self-pay | Admitting: Internal Medicine

## 2017-12-23 NOTE — Telephone Encounter (Signed)
Pepcid 20mg  last refilled on 09/24/17 with a note to pharmacist to defer further refills to PCP. Pt was instructed to f/u prn at last OV 07/29/17 [facility] and was placed on hold for 27min. Called pt to make him aware of above information.  Pt states he will contact PCP for further refills on Pepcid 20mg  Nothing further is needed.

## 2018-02-09 ENCOUNTER — Ambulatory Visit (INDEPENDENT_AMBULATORY_CARE_PROVIDER_SITE_OTHER): Payer: PPO | Admitting: Endocrinology

## 2018-02-09 ENCOUNTER — Encounter: Payer: Self-pay | Admitting: Endocrinology

## 2018-02-09 VITALS — BP 105/52 | HR 58 | Wt 218.2 lb

## 2018-02-09 DIAGNOSIS — E11649 Type 2 diabetes mellitus with hypoglycemia without coma: Secondary | ICD-10-CM | POA: Diagnosis not present

## 2018-02-09 LAB — POCT GLYCOSYLATED HEMOGLOBIN (HGB A1C): Hemoglobin A1C: 6.2 % — AB (ref 4.0–5.6)

## 2018-02-09 NOTE — Patient Instructions (Addendum)
check your blood sugar twice a day.  vary the time of day when you check, between before the 3 meals, and at bedtime.  also check if you have symptoms of your blood sugar being too high or too low.  please keep a record of the readings and bring it to your next appointment here (or you can bring the meter itself).  You can write it on any piece of paper.  please call us sooner if you have a lot of readings under 100 or over 200.  Please call if you go back on the steroids.  Please come back for a follow-up appointment in 4-6 months.

## 2018-02-09 NOTE — Progress Notes (Signed)
Subjective:    Patient ID: Kurt Aguilar, male    DOB: June 29, 1946, 72 y.o.   MRN: [id number]  HPI Pt returns for f/u of diabetes mellitus: DM type: Insulin-requiring type 2.   Dx'ed: 7425 Complications: polyneuropathy, renal insufficiency, and CAD Therapy: insulin since early 2018. DKA: never.  Severe hypoglycemia: never.  Pancreatitis: never.  Other: he takes intermitt prednisone for BOOP; renal insuff limits rx options; he took insulin 2018-2019.   Interval history: Meter is downloaded today, and the printout is scanned into the record.  It varies from 70-182. There is no trend throughout the day.  No recent prednisone.   Past Medical History:  Diagnosis Date  . Arthritis   . Atypical migraine   . Diabetes ([place])   . Diverticulitis   . GERD (gastroesophageal reflux disease)   . Gout   . Heart attack ([place])   . Heart disease   . Hyperglycemia   . Hyperlipidemia   . Hypertension   . Osteoporosis   . Sleep apnea     Past Surgical History:  Procedure Laterality Date  . CATARACT EXTRACTION Bilateral   . CORONARY ARTERY BYPASS GRAFT     [place]  . open heart surgery  09/09/1995-1998   5 bypass  . REPAIR KNEE LIGAMENT     right  . TONSILLECTOMY      Social History   Socioeconomic History  . Marital status: Married    Spouse name: Not on file  . Number of children: 0  . Years of education: Not on file  . Highest education level: Not on file  Occupational History  . Occupation: Armed forces technical officer: RETIRED  Social Needs  . Financial resource strain: Not on file  . Food insecurity:    Worry: Not on file    Inability: Not on file  . Transportation needs:    Medical: Not on file    Non-medical: Not on file  Tobacco Use  . Smoking status: Former Smoker    Packs/day: 1.00    Years: 3.00    Pack years: 3.00    Types: Cigars    Last attempt to quit: 11/25/2004    Years since quitting: 13.2  . Smokeless tobacco: Never Used  . Tobacco  comment: occas. cigar quit 2006  Substance and Sexual Activity  . Alcohol use: Yes    Alcohol/week: 0.6 oz    Types: 1 Standard drinks or equivalent per week    Comment: rare  . Drug use: No  . Sexual activity: Never  Lifestyle  . Physical activity:    Days per week: Not on file    Minutes per session: Not on file  . Stress: Not on file  Relationships  . Social connections:    Talks on phone: Not on file    Gets together: Not on file    Attends religious service: Not on file    Active member of club or organization: Not on file    Attends meetings of clubs or organizations: Not on file    Relationship status: Not on file  . Intimate partner violence:    Fear of current or ex partner: Not on file    Emotionally abused: Not on file    Physically abused: Not on file    Forced sexual activity: Not on file  Other Topics Concern  . Not on file  Social History Narrative  . Not on file    Current Outpatient Medications on File  Prior to Visit  Medication Sig Dispense Refill  . albuterol (VENTOLIN HFA) 108 (90 Base) MCG/ACT inhaler Inhale 2 puffs into the lungs every 4 (four) hours as needed for wheezing or shortness of breath (tight lungs ). 1 Inhaler prn  . allopurinol (ZYLOPRIM) 100 MG tablet take 1 tablet by mouth twice a day 180 tablet 0  . aspirin 81 MG tablet 81 mg 2 (two) times daily. Reported on 12/19/2015    . azelastine (OPTIVAR) 0.05 % ophthalmic solution Place 1 drop into both eyes as needed.    . benzonatate (TESSALON) 100 MG capsule Take 1-2 capsules (100-200 mg total) by mouth 3 (three) times daily as needed. 30 capsule 0  . CINNAMON PO Take 1,000 mg by mouth 2 (two) times daily.    . colchicine 0.6 MG tablet TAKE 1 TABLET BY MOUTH TWICE A DAY WITH A MEAL AS NEEDED FOR ACUTE FLARE OF GOUT 45 tablet 1  . ezetimibe (ZETIA) 10 MG tablet Take 1 tablet (10 mg total) by mouth daily. 90 tablet 3  . furosemide (LASIX) 20 MG tablet take 1 tablet by mouth once daily if needed 30  tablet 3  . Glucosamine HCl (GLUCOSAMINE PO) Take 1,500 mg by mouth 2 (two) times daily. Reported on 12/19/2015    . glucose blood (PRODIGY NO CODING BLOOD GLUC) test strip Check blood sugar 4 times a day and as directed. Dx E11.65 300 each 5  . Lutein 20 MG CAPS Take 1 capsule by mouth daily. Reported on 12/19/2015    . metoprolol tartrate (LOPRESSOR) 25 MG tablet Take 12.5 mg by mouth 2 (two) times daily.    . polycarbophil (FIBERCON) 625 MG tablet Take 625 mg by mouth daily. Reported on 12/19/2015    . rosuvastatin (CRESTOR) 40 MG tablet Take 1 tablet (40 mg total) by mouth daily. 90 tablet 3  . famotidine (PEPCID) 20 MG tablet Take 1 tablet (20 mg total) by mouth at bedtime. (Patient not taking: Reported on 02/09/2018) 90 tablet 0  . omeprazole (PRILOSEC) 20 MG capsule Take 20 mg by mouth 2 (two) times daily.  0   Current Facility-Administered Medications on File Prior to Visit  Medication Dose Route Frequency Provider Last Rate Last Dose  . betamethasone acetate-betamethasone sodium phosphate (CELESTONE) injection 3 mg  3 mg Intramuscular Once Edrick Kins, DPM        Allergies  Allergen Reactions  . Oxycodone-Acetaminophen     Stops breathing    Family History  Problem Relation Age of Onset  . Heart disease Mother   . Heart disease Father   . Stroke Father   . Heart attack Son 23  . Kidney disease Neg Hx   . Prostate cancer Neg Hx   . Diabetes Neg Hx     BP (!) 105/52   Pulse (!) 58   Wt 218 lb 3.2 oz (99 kg)   SpO2 95%   BMI 30.87 kg/m    Review of Systems He has lost a few lbs.      Objective:   Physical Exam VITAL SIGNS:  See vs page.  GENERAL: no distress. Pulses: foot pulses are intact bilaterally.   MSK: no deformity of the feet or ankles.  CV: trace bilat leg edema.  Skin:  no ulcer on the feet or ankles.  normal color and temp on the feet and ankles.  Old healed surgical scars on both legs (vein harvest).  Neuro: sensation is intact to touch on the feet  and ankles.   Lab Results  Component Value Date   HGBA1C 6.2 (A) 02/09/2018   Lab Results  Component Value Date   CREATININE 1.70 (H) 02/04/2017   BUN 25 (H) 02/04/2017   NA 142 02/04/2017   K 4.6 02/04/2017   CL 105 02/04/2017   CO2 31 02/04/2017      Assessment & Plan:  Type 2 DM: better.  he declines medication for now. BOOP: he may have to resume steroid rx.   Renal insuff: this limits rx options.     Patient Instructions  check your blood sugar twice a day.  vary the time of day when you check, between before the 3 meals, and at bedtime.  also check if you have symptoms of your blood sugar being too high or too low.  please keep a record of the readings and bring it to your next appointment here (or you can bring the meter itself).  You can write it on any piece of paper.  please call us sooner if you have a lot of readings under 100 or over 200.  Please call if you go back on the steroids.  Please come back for a follow-up appointment in 4-6 months.

## 2018-02-24 DIAGNOSIS — L814 Other melanin hyperpigmentation: Secondary | ICD-10-CM | POA: Diagnosis not present

## 2018-02-24 DIAGNOSIS — L819 Disorder of pigmentation, unspecified: Secondary | ICD-10-CM | POA: Diagnosis not present

## 2018-02-24 DIAGNOSIS — D1801 Hemangioma of skin and subcutaneous tissue: Secondary | ICD-10-CM | POA: Diagnosis not present

## 2018-02-24 DIAGNOSIS — L570 Actinic keratosis: Secondary | ICD-10-CM | POA: Diagnosis not present

## 2018-02-24 DIAGNOSIS — D229 Melanocytic nevi, unspecified: Secondary | ICD-10-CM | POA: Diagnosis not present

## 2018-02-24 DIAGNOSIS — L821 Other seborrheic keratosis: Secondary | ICD-10-CM | POA: Diagnosis not present

## 2018-03-07 ENCOUNTER — Other Ambulatory Visit: Payer: Self-pay | Admitting: Family Medicine

## 2018-03-08 ENCOUNTER — Ambulatory Visit (INDEPENDENT_AMBULATORY_CARE_PROVIDER_SITE_OTHER): Payer: PPO

## 2018-03-08 VITALS — BP 132/70 | HR 54 | Temp 97.7°F | Ht 71.5 in | Wt 219.0 lb

## 2018-03-08 DIAGNOSIS — Z0000 Encounter for general adult medical examination without abnormal findings: Secondary | ICD-10-CM

## 2018-03-08 DIAGNOSIS — Z125 Encounter for screening for malignant neoplasm of prostate: Secondary | ICD-10-CM

## 2018-03-08 DIAGNOSIS — E1165 Type 2 diabetes mellitus with hyperglycemia: Secondary | ICD-10-CM | POA: Diagnosis not present

## 2018-03-08 DIAGNOSIS — I10 Essential (primary) hypertension: Secondary | ICD-10-CM

## 2018-03-08 DIAGNOSIS — E785 Hyperlipidemia, unspecified: Secondary | ICD-10-CM | POA: Diagnosis not present

## 2018-03-08 LAB — CBC WITH DIFFERENTIAL/PLATELET
BASOS ABS: 0.1 10*3/uL (ref 0.0–0.1)
Basophils Relative: 0.7 % (ref 0.0–3.0)
Eosinophils Absolute: 0.5 10*3/uL (ref 0.0–0.7)
Eosinophils Relative: 6.1 % — ABNORMAL HIGH (ref 0.0–5.0)
HCT: 43.4 % (ref 39.0–52.0)
HEMOGLOBIN: 14.5 g/dL (ref 13.0–17.0)
LYMPHS PCT: 31.6 % (ref 12.0–46.0)
Lymphs Abs: 2.5 10*3/uL (ref 0.7–4.0)
MCHC: 33.5 g/dL (ref 30.0–36.0)
MCV: 93.6 fl (ref 78.0–100.0)
MONOS PCT: 8.5 % (ref 3.0–12.0)
Monocytes Absolute: 0.7 10*3/uL (ref 0.1–1.0)
NEUTROS PCT: 53.1 % (ref 43.0–77.0)
Neutro Abs: 4.1 10*3/uL (ref 1.4–7.7)
Platelets: 238 10*3/uL (ref 150.0–400.0)
RBC: 4.64 Mil/uL (ref 4.22–5.81)
RDW: 14.5 % (ref 11.5–15.5)
WBC: 7.8 10*3/uL (ref 4.0–10.5)

## 2018-03-08 LAB — COMPREHENSIVE METABOLIC PANEL
ALK PHOS: 51 U/L (ref 39–117)
ALT: 18 U/L (ref 0–53)
AST: 20 U/L (ref 0–37)
Albumin: 4.3 g/dL (ref 3.5–5.2)
BILIRUBIN TOTAL: 0.8 mg/dL (ref 0.2–1.2)
BUN: 31 mg/dL — ABNORMAL HIGH (ref 6–23)
CHLORIDE: 102 meq/L (ref 96–112)
CO2: 31 mEq/L (ref 19–32)
Calcium: 9.4 mg/dL (ref 8.4–10.5)
Creatinine, Ser: 1.49 mg/dL (ref 0.40–1.50)
GFR: 48.94 mL/min — ABNORMAL LOW (ref >60.00)
Glucose, Bld: 118 mg/dL — ABNORMAL HIGH (ref 70–99)
Potassium: 4.8 mEq/L (ref 3.5–5.1)
SODIUM: 141 meq/L (ref 135–145)
Total Protein: 6.8 g/dL (ref 6.0–8.3)

## 2018-03-08 LAB — LDL CHOLESTEROL, DIRECT: Direct LDL: 50 mg/dL

## 2018-03-08 LAB — MICROALBUMIN / CREATININE URINE RATIO
CREATININE, U: 87.7 mg/dL
MICROALB/CREAT RATIO: 1.7 mg/g (ref 0.0–30.0)
Microalb, Ur: 1.5 mg/dL (ref 0.0–1.9)

## 2018-03-08 LAB — LIPID PANEL
CHOLESTEROL: 115 mg/dL (ref 0–200)
HDL: 37.5 mg/dL — AB (ref >39.00)
NonHDL: 77.14
TRIGLYCERIDES: 203 mg/dL — AB (ref 0.0–149.0)
Total CHOL/HDL Ratio: 3
VLDL: 40.6 mg/dL — AB (ref 0.0–40.0)

## 2018-03-08 LAB — TSH: TSH: 3.34 u[IU]/mL (ref 0.35–4.50)

## 2018-03-08 LAB — PSA, MEDICARE: PSA: 1.15 ng/ml (ref 0.10–4.00)

## 2018-03-08 NOTE — Progress Notes (Signed)
Subjective:   Kurt Aguilar is a 72 y.o. male who presents for Medicare Annual/Subsequent preventive examination.  Review of Systems:  N/A Cardiac Risk Factors include: advanced age (>60men, >40 women);male gender;diabetes mellitus;obesity (BMI >30kg/m2);dyslipidemia;hypertension     Objective:    Vitals: BP 132/70 (BP Location: Right Arm, Patient Position: Sitting, Cuff Size: Normal)   Pulse (!) 54   Temp 97.7 F (36.5 C) (Oral)   Ht 5' 11.5" (1.816 m) Comment: shoes  Wt 219 lb (99.3 kg)   SpO2 98%   BMI 30.12 kg/m   Body mass index is 30.12 kg/m.  Advanced Directives 03/08/2018 02/13/2017 02/06/2016  Does Patient Have a Medical Advance Directive? Yes Yes Yes  Type of Paramedic of [place];Living will [place];Living will [place];Living will  Does patient want to make changes to medical advance directive? - - No - Patient declined  Copy of [place] in Chart? No - copy requested Yes Yes    Tobacco Social History   Tobacco Use  Smoking Status Former Smoker  . Packs/day: 1.00  . Years: 3.00  . Pack years: 3.00  . Types: Cigars  . Last attempt to quit: 11/25/2004  . Years since quitting: 13.2  Smokeless Tobacco Never Used  Tobacco Comment   occas. cigar quit 2006     Counseling given: No Comment: occas. cigar quit 2006   Clinical Intake:  Pre-visit preparation completed: Yes  Pain Score: 1      Nutritional Status: BMI > 30  Obese Nutritional Risks: None Diabetes: Yes CBG done?: No Did pt. bring in CBG monitor from home?: No  How often do you need to have someone help you when you read instructions, pamphlets, or other written materials from your doctor or pharmacy?: 1 - Never What is the last grade level you completed in school?: 12th grade + 2 yrs college  Interpreter Needed?: No  Comments: pt lives with spouse Information entered by :: LPinson, LPN  Past Medical  History:  Diagnosis Date  . Arthritis   . Atypical migraine   . Diabetes ([place])   . Diverticulitis   . GERD (gastroesophageal reflux disease)   . Gout   . Heart attack ([place])   . Heart disease   . Hyperglycemia   . Hyperlipidemia   . Hypertension   . Osteoporosis   . Sleep apnea    Past Surgical History:  Procedure Laterality Date  . CATARACT EXTRACTION Bilateral   . CORONARY ARTERY BYPASS GRAFT     [place]  . open heart surgery  09/09/1995-1998   5 bypass  . REPAIR KNEE LIGAMENT     right  . TONSILLECTOMY     Family History  Problem Relation Age of Onset  . Heart disease Mother   . Heart disease Father   . Stroke Father   . Heart attack Son 42  . Kidney disease Neg Hx   . Prostate cancer Neg Hx   . Diabetes Neg Hx    Social History   Socioeconomic History  . Marital status: Married    Spouse name: Not on file  . Number of children: 0  . Years of education: Not on file  . Highest education level: Not on file  Occupational History  . Occupation: Armed forces technical officer: RETIRED  Social Needs  . Financial resource strain: Not on file  . Food insecurity:    Worry: Not on file  Inability: Not on file  . Transportation needs:    Medical: Not on file    Non-medical: Not on file  Tobacco Use  . Smoking status: Former Smoker    Packs/day: 1.00    Years: 3.00    Pack years: 3.00    Types: Cigars    Last attempt to quit: 11/25/2004    Years since quitting: 13.2  . Smokeless tobacco: Never Used  . Tobacco comment: occas. cigar quit 2006  Substance and Sexual Activity  . Alcohol use: Yes    Alcohol/week: 0.6 oz    Types: 1 Standard drinks or equivalent per week    Comment: rare  . Drug use: No  . Sexual activity: Never  Lifestyle  . Physical activity:    Days per week: Not on file    Minutes per session: Not on file  . Stress: Not on file  Relationships  . Social connections:    Talks on phone: Not on file    Gets together: Not on file     Attends religious service: Not on file    Active member of club or organization: Not on file    Attends meetings of clubs or organizations: Not on file    Relationship status: Not on file  Other Topics Concern  . Not on file  Social History Narrative  . Not on file    Outpatient Encounter Medications as of 03/08/2018  Medication Sig  . albuterol (VENTOLIN HFA) 108 (90 Base) MCG/ACT inhaler Inhale 2 puffs into the lungs every 4 (four) hours as needed for wheezing or shortness of breath (tight lungs ).  Marland Kitchen allopurinol (ZYLOPRIM) 100 MG tablet take 1 tablet by mouth twice a day  . aspirin 81 MG tablet 81 mg 2 (two) times daily. Reported on 12/19/2015  . azelastine (OPTIVAR) 0.05 % ophthalmic solution Place 1 drop into both eyes as needed.  . benzonatate (TESSALON) 100 MG capsule Take 1-2 capsules (100-200 mg total) by mouth 3 (three) times daily as needed.  Marland Kitchen CINNAMON PO Take 1,000 mg by mouth 2 (two) times daily.  . colchicine 0.6 MG tablet TAKE 1 TABLET BY MOUTH TWICE A DAY WITH A MEAL AS NEEDED FOR ACUTE FLARE OF GOUT  . ezetimibe (ZETIA) 10 MG tablet Take 1 tablet (10 mg total) by mouth daily.  . furosemide (LASIX) 20 MG tablet take 1 tablet by mouth once daily if needed  . Glucosamine HCl (GLUCOSAMINE PO) Take 1,500 mg by mouth 2 (two) times daily. Reported on 12/19/2015  . glucose blood (PRODIGY NO CODING BLOOD GLUC) test strip Check blood sugar 4 times a day and as directed. Dx E11.65  . Lutein 20 MG CAPS Take 1 capsule by mouth daily. Reported on 12/19/2015  . metoprolol tartrate (LOPRESSOR) 25 MG tablet Take 12.5 mg by mouth 2 (two) times daily.  Marland Kitchen omeprazole (PRILOSEC) 20 MG capsule Take 20 mg by mouth 2 (two) times daily.  . polycarbophil (FIBERCON) 625 MG tablet Take 625 mg by mouth daily. Reported on 12/19/2015  . rosuvastatin (CRESTOR) 40 MG tablet Take 1 tablet (40 mg total) by mouth daily.  . [DISCONTINUED] famotidine (PEPCID) 20 MG tablet Take 1 tablet (20 mg total) by mouth at  bedtime. (Patient not taking: Reported on 02/09/2018)   Facility-Administered Encounter Medications as of 03/08/2018  Medication  . betamethasone acetate-betamethasone sodium phosphate (CELESTONE) injection 3 mg    Activities of Daily Living In your present state of health, do you have any difficulty performing  the following activities: 03/08/2018  Hearing? N  Vision? N  Difficulty concentrating or making decisions? N  Walking or climbing stairs? N  Dressing or bathing? N  Doing errands, shopping? N  Preparing Food and eating ? N  Using the Toilet? N  In the past six months, have you accidently leaked urine? N  Do you have problems with loss of bowel control? N  Managing your Medications? N  Managing your Finances? N  Housekeeping or managing your Housekeeping? N  Some recent data might be hidden    Patient Care Team: Tower, [name], MD as PCP - General [name], Kathlene November, MD as Consulting Physician (Cardiology) Tanda Rockers, MD as Consulting Physician (Pulmonary Disease) Buren Kos., MD as Referring Physician (Dentistry)   Assessment:   This is a routine wellness examination for Elpidio.  Hearing Screening Comments: Hearing aids Vision Screening Comments: October 2018 with Dr. Ellin Mayhew   Exercise Activities and Dietary recommendations Current Exercise Habits: Home exercise routine, Type of exercise: walking;Other - see comments(golfing, water aerobics), Time (Minutes): 60, Frequency (Times/Week): 7, Weekly Exercise (Minutes/Week): 420, Exercise limited by: None identified  Goals    . Weight < 196 lb (88.905 kg)     Starting 03/08/2018, I will continue to exercise for at least 60 min daily.        Fall Risk Fall Risk  03/08/2018 02/13/2017 02/06/2016 11/15/2015 08/07/2014  Falls in the past year? No No No No No  Number falls in past yr: - - - - -   Depression Screen PHQ 2/9 Scores 03/08/2018 02/13/2017 02/06/2016 11/15/2015  PHQ - 2 Score 0 0 0 0  PHQ- 9 Score 0 - - -     Cognitive Function MMSE - Mini Mental State Exam 03/08/2018 02/13/2017 02/06/2016  Orientation to time 5 5 5   Orientation to Place 5 5 5   Registration 3 3 3   Attention/ Calculation 0 0 0  Recall 3 3 3   Language- name 2 objects 0 0 0  Language- repeat 1 1 1   Language- follow 3 step command 3 3 3   Language- read & follow direction 0 0 0  Write a sentence 0 0 0  Copy design 0 0 0  Total score 20 20 20      PLEASE NOTE: A Mini-Cog screen was completed. Maximum score is 20. A value of 0 denotes this part of [place] MMSE was not completed or the patient failed this part of the Mini-Cog screening.   Mini-Cog Screening Orientation to Time - Max 5 pts Orientation to Place - Max 5 pts Registration - Max 3 pts Recall - Max 3 pts Language Repeat - Max 1 pts Language Follow 3 Step Command - Max 3 pts     Immunization History  Administered Date(s) Administered  . Influenza Split 07/09/2011  . Influenza Whole 05/14/2010, 06/09/2012  . Influenza, High Dose Seasonal PF 05/22/2014, 07/04/2015, 05/16/2016  . Influenza,inj,Quad PF,6+ Mos 07/11/2013  . Influenza-Unspecified 07/03/2017  . Pneumococcal Conjugate-13 08/07/2014  . Pneumococcal Polysaccharide-23 09/08/2002, 07/11/2013  . Td 09/08/2004, 02/12/2016  . Zoster 07/09/2011    Screening Tests Health Maintenance  Topic Date Due  . INFLUENZA VACCINE  04/08/2018  . OPHTHALMOLOGY EXAM  06/08/2018  . HEMOGLOBIN A1C  08/11/2018  . FOOT EXAM  02/10/2019  . URINE MICROALBUMIN  03/09/2019  . TETANUS/TDAP  02/11/2026  . COLONOSCOPY  05/02/2027  . Hepatitis C Screening  Completed  . PNA vac Low Risk Adult  Completed  Plan:     I have personally reviewed, addressed, and noted the following in the patient's chart:  A. Medical and social history B. Use of alcohol, tobacco or illicit drugs  C. Current medications and supplements D. Functional ability and status E.  Nutritional status F.  Physical activity G. Advance  directives H. List of other physicians I.  Hospitalizations, surgeries, and ER visits in previous 12 months J.  [place] to include hearing, vision, cognitive, depression L. Referrals and appointments - none  In addition, I have reviewed and discussed with patient certain preventive protocols, quality metrics, and best practice recommendations. A written personalized care plan for preventive services as well as general preventive health recommendations were provided to patient.  See attached scanned questionnaire for additional information.   Signed,   Lindell Noe, MHA, BS, LPN Health Coach

## 2018-03-08 NOTE — Progress Notes (Signed)
PCP notes:   Health maintenance:  Microalbumin - completed Eye exam - per pt, exam in Oct 2018  Abnormal screenings:   None  Patient concerns:   Pt requested refill of Metoprolol Tartrate. Per med flowsheet, PCP reordered medication on 03/07/2018. Contacted pharmacy on record. Per Audelia Acton, he did not have [employer] electronic refill request from PCP. Provided verbal order for medication refill based on PCP's order. Contacted patient to advise him medication refill had been requested and to contact pharmacy for pickup. Patient verbalized understanding.   Nurse concerns:  None  Next PCP appt:   03/16/18 @ 1430  I reviewed health advisor's note, was available for consultation, and agree with documentation and plan. Loura Pardon MD

## 2018-03-08 NOTE — Patient Instructions (Signed)
Kurt Aguilar , Thank you for taking time to come for your Medicare Wellness Visit. I appreciate your ongoing commitment to your health goals. Please review the following plan we discussed and let me know if I can assist you in the future.   These are the goals we discussed: Goals    . Weight < 196 lb (88.905 kg)     Starting 03/08/2018, I will continue to exercise for at least 60 min daily.        This is a list of the screening recommended for you and due dates:  Health Maintenance  Topic Date Due  . Flu Shot  04/08/2018  . Eye exam for diabetics  06/08/2018  . Hemoglobin A1C  08/11/2018  . Complete foot exam   02/10/2019  . Urine Protein Check  03/09/2019  . Tetanus Vaccine  02/11/2026  . Colon Cancer Screening  05/02/2027  .  Hepatitis C: One time screening is recommended by Center for Disease Control  (CDC) for  adults born from [birth year] through 1967.   Completed  . Pneumonia vaccines  Completed   Preventive Care for Adults  A healthy lifestyle and preventive care can promote health and wellness. Preventive health guidelines for adults include the following key practices.  . A routine yearly physical is a good way to check with your health care provider about your health and preventive screening. It is a chance to share any concerns and updates on your health and to receive a thorough exam.  . Visit your dentist for a routine exam and preventive care every 6 months. Brush your teeth twice a day and floss once a day. Good oral hygiene prevents tooth decay and gum disease.  . The frequency of eye exams is based on your age, health, family medical history, use  of contact lenses, and other factors. Follow your health care provider's recommendations for frequency of eye exams.  . Eat a healthy diet. Foods like vegetables, fruits, whole grains, low-fat dairy products, and lean protein foods contain the nutrients you need without too many calories. Decrease your intake of foods high in  solid fats, added sugars, and salt. Eat the right amount of calories for you. Get information about a proper diet from your health care provider, if necessary.  . Regular physical exercise is one of the most important things you can do for your health. Most adults should get at least 150 minutes of moderate-intensity exercise (any activity that increases your heart rate and causes you to sweat) each week. In addition, most adults need muscle-strengthening exercises on 2 or more days a week.  Silver Sneakers may be a benefit available to you. To determine eligibility, you may visit the website: www.silversneakers.com or contact program at 915-475-1901 Mon-Fri between 8AM-8PM.   . Maintain a healthy weight. The body mass index (BMI) is a screening tool to identify possible weight problems. It provides an estimate of body fat based on height and weight. Your health care provider can find your BMI and can help you achieve or maintain a healthy weight.   For adults 20 years and older: ? A BMI below 18.5 is considered underweight. ? A BMI of 18.5 to 24.9 is normal. ? A BMI of 25 to 29.9 is considered overweight. ? A BMI of 30 and above is considered obese.   . Maintain normal blood lipids and cholesterol levels by exercising and minimizing your intake of saturated fat. Eat a balanced diet with plenty of fruit and vegetables.  Blood tests for lipids and cholesterol should begin at age 87 and be repeated every 5 years. If your lipid or cholesterol levels are high, you are over 50, or you are at high risk for heart disease, you may need your cholesterol levels checked more frequently. Ongoing high lipid and cholesterol levels should be treated with medicines if diet and exercise are not working.  . If you smoke, find out from your health care provider how to quit. If you do not use tobacco, please do not start.  . If you choose to drink alcohol, please do not consume more than 2 drinks per day. One drink  is considered to be 12 ounces (355 mL) of beer, 5 ounces (148 mL) of wine, or 1.5 ounces (44 mL) of liquor.  . If you are 23-30 years old, ask your health care provider if you should take aspirin to prevent strokes.  . Use sunscreen. Apply sunscreen liberally and repeatedly throughout the day. You should seek shade when your shadow is shorter than you. Protect yourself by wearing long sleeves, pants, a wide-brimmed hat, and sunglasses year round, whenever you are outdoors.  . Once a month, do a whole body skin exam, using a mirror to look at the skin on your back. Tell your health care provider of new moles, moles that have irregular borders, moles that are larger than a pencil eraser, or moles that have changed in shape or color.

## 2018-03-09 ENCOUNTER — Other Ambulatory Visit: Payer: PPO

## 2018-03-12 ENCOUNTER — Encounter: Payer: PPO | Admitting: Family Medicine

## 2018-03-16 ENCOUNTER — Ambulatory Visit (INDEPENDENT_AMBULATORY_CARE_PROVIDER_SITE_OTHER): Payer: PPO | Admitting: Family Medicine

## 2018-03-16 ENCOUNTER — Encounter: Payer: Self-pay | Admitting: Family Medicine

## 2018-03-16 VITALS — BP 114/72 | HR 81 | Temp 97.7°F | Ht 71.5 in | Wt 221.0 lb

## 2018-03-16 DIAGNOSIS — Z125 Encounter for screening for malignant neoplasm of prostate: Secondary | ICD-10-CM | POA: Diagnosis not present

## 2018-03-16 DIAGNOSIS — Z0000 Encounter for general adult medical examination without abnormal findings: Secondary | ICD-10-CM | POA: Diagnosis not present

## 2018-03-16 DIAGNOSIS — I10 Essential (primary) hypertension: Secondary | ICD-10-CM

## 2018-03-16 DIAGNOSIS — E1165 Type 2 diabetes mellitus with hyperglycemia: Secondary | ICD-10-CM | POA: Diagnosis not present

## 2018-03-16 DIAGNOSIS — E669 Obesity, unspecified: Secondary | ICD-10-CM | POA: Diagnosis not present

## 2018-03-16 DIAGNOSIS — E785 Hyperlipidemia, unspecified: Secondary | ICD-10-CM | POA: Diagnosis not present

## 2018-03-16 DIAGNOSIS — N138 Other obstructive and reflux uropathy: Secondary | ICD-10-CM

## 2018-03-16 DIAGNOSIS — N289 Disorder of kidney and ureter, unspecified: Secondary | ICD-10-CM | POA: Diagnosis not present

## 2018-03-16 DIAGNOSIS — N401 Enlarged prostate with lower urinary tract symptoms: Secondary | ICD-10-CM | POA: Diagnosis not present

## 2018-03-16 MED ORDER — ALLOPURINOL 100 MG PO TABS: 100.0000 mg | ORAL_TABLET | Freq: Two times a day (BID) | ORAL | 3 refills | Status: DC

## 2018-03-16 MED ORDER — METOPROLOL TARTRATE 25 MG PO TABS: 25.0000 mg | ORAL_TABLET | Freq: Two times a day (BID) | ORAL | 3 refills | Status: DC

## 2018-03-16 MED ORDER — ROSUVASTATIN CALCIUM 40 MG PO TABS: 40.0000 mg | ORAL_TABLET | Freq: Every day | ORAL | 3 refills | Status: DC

## 2018-03-16 MED ORDER — COLCHICINE 0.6 MG PO TABS: ORAL_TABLET | ORAL | 3 refills | Status: DC

## 2018-03-16 MED ORDER — EZETIMIBE 10 MG PO TABS: 10.0000 mg | ORAL_TABLET | Freq: Every day | ORAL | 3 refills | Status: DC

## 2018-03-16 MED ORDER — OMEPRAZOLE 20 MG PO CPDR: 20.0000 mg | DELAYED_RELEASE_CAPSULE | Freq: Every day | ORAL | 3 refills | Status: DC

## 2018-03-16 NOTE — Assessment & Plan Note (Signed)
bp in fair control at this time  BP Readings from Last 1 Encounters:  03/16/18 114/72   No changes needed Most recent labs reviewed  Disc lifstyle change with low sodium diet and exercise

## 2018-03-16 NOTE — Progress Notes (Signed)
Subjective:    Patient ID: Kurt Aguilar, male    DOB: [birth year], 72 y.o.   MRN: [id number]  HPI Here for health maintenance exam and to review chronic medical problems    Leaving for [place] next week  Taking their family - looking forward to it   Wt Readings from Last 3 Encounters:  03/16/18 221 lb (100.2 kg)  03/08/18 219 lb (99.3 kg)  02/09/18 218 lb 3.2 oz (99 kg)   30.39 kg/m  Was 251 a year ago Eating healthy  Exercising - water aerobics for 2 h three times per week  Walking and golf also   Had amw on 7/1 No concerns   Colonoscopy 8/18  Polyps found -adenomas with 5 y recall  More problems with constipation - started drinking prune juice (bad for her sugar)  Also benefiber  Eating fruits and veggies     Prostate health Hx of BPH-no problems with frequency or nocturia  Lab Results  Component Value Date   PSA 1.15 03/08/2018   PSA 0.62 02/04/2017   PSA 0.87 02/06/2016     Zoster status - had zostavax  Is on the wait list for shingrix    bp is stable today  No cp or palpitations or headaches or edema  No side effects to medicines  BP Readings from Last 3 Encounters:  03/16/18 114/72  03/08/18 132/70  02/09/18 (!) 105/52    CAD-doing well/ no heart issues     H/o renal insuff from hypertensive nephropathy  Lab Results  Component Value Date   CREATININE 1.49 03/08/2018   BUN 31 (H) 03/08/2018   NA 141 03/08/2018   K 4.8 03/08/2018   CL 102 03/08/2018   CO2 31 03/08/2018  avoids nsaids  Drinking more water Improved from a year ago    Hyperlipidemia Lab Results  Component Value Date   CHOL 115 03/08/2018   CHOL 129 02/04/2017   CHOL 159 02/06/2016   Lab Results  Component Value Date   HDL 37.50 (L) 03/08/2018   HDL 34.00 (L) 02/04/2017   HDL 55.20 02/06/2016   Lab Results  Component Value Date   LDLCALC 72 02/06/2016   LDLCALC 106 (H) 07/28/2014   LDLCALC 53 11/24/2012   Lab Results  Component Value Date   TRIG 203.0  (H) 03/08/2018   TRIG 216.0 (H) 02/04/2017   TRIG 163.0 (H) 02/06/2016   Lab Results  Component Value Date   CHOLHDL 3 03/08/2018   CHOLHDL 4 02/04/2017   CHOLHDL 3 02/06/2016   Lab Results  Component Value Date   LDLDIRECT 50.0 03/08/2018   LDLDIRECT 59.0 02/04/2017   LDLDIRECT 65.4 07/04/2013   crestor and diet  Also zetia    Lab Results  Component Value Date   ALT 18 03/08/2018   AST 20 03/08/2018   ALKPHOS 51 03/08/2018   BILITOT 0.8 03/08/2018      Pulmonary f/u for BOOP-no longer on prednisone  Breathing is good  Much better    DM2 Sees endocrinology Lab Results  Component Value Date   HGBA1C 6.2 (A) 02/09/2018   Well controlled  No longer on prednisone  Off insulin  utd on eye exam   Lab Results  Component Value Date   WBC 7.8 03/08/2018   HGB 14.5 03/08/2018   HCT 43.4 03/08/2018   MCV 93.6 03/08/2018   PLT 238.0 03/08/2018   Lab Results  Component Value Date   TSH 3.34 03/08/2018    Patient Active  Problem List   Diagnosis Date Noted  . Colon cancer screening 02/13/2017  . Dyspnea on exertion 11/11/2016  . Diabetes mellitus type 2, uncontrolled ([place]) 08/13/2016  . Chronic heel pain, left 08/12/2016  . Upper airway cough syndrome 07/31/2016  . Obesity (BMI 30-39.9) 05/19/2016  . Routine general medical examination at a health care facility 01/27/2016  . Pulmonary infiltrates with high  ESR c/w BOOP/ idiopathic  12/19/2015  . Fatigue 12/12/2015  . Cough 11/29/2015  . Cystic kidney disease 10/01/2015  . Renal lesion 09/27/2015  . Erectile dysfunction of organic origin 09/27/2015  . BPH with obstruction/lower urinary tract symptoms 09/27/2015  . Constipation 09/24/2015  . Cyst of right kidney 09/24/2015  . CAD (coronary artery disease) 06/08/2015  . Grieving 11/22/2014  . Encounter for Medicare annual wellness exam 07/11/2013  . Prostate cancer screening 07/03/2013  . Right bundle branch block 02/22/2013  . Chronic low back pain  05/13/2011  . Obstructive sleep apnea 03/15/2010  . Hyperlipidemia 04/12/2009  . Gout 04/12/2009  . Essential hypertension 04/12/2009  . CAD (coronary artery disease) of artery bypass graft 04/12/2009  . GERD 04/12/2009  . Renal insufficiency 04/12/2009  . DIVERTICULITIS, HX OF 04/12/2009   Past Medical History:  Diagnosis Date  . Arthritis   . Atypical migraine   . Diabetes ([place])   . Diverticulitis   . GERD (gastroesophageal reflux disease)   . Gout   . Heart attack ([place])   . Heart disease   . Hyperglycemia   . Hyperlipidemia   . Hypertension   . Osteoporosis   . Sleep apnea    Past Surgical History:  Procedure Laterality Date  . CATARACT EXTRACTION Bilateral   . CORONARY ARTERY BYPASS GRAFT     [place]  . open heart surgery  09/09/1995-1998   5 bypass  . REPAIR KNEE LIGAMENT     right  . TONSILLECTOMY     Social History   Tobacco Use  . Smoking status: Former Smoker    Packs/day: 1.00    Years: 3.00    Pack years: 3.00    Types: Cigars    Last attempt to quit: 11/25/2004    Years since quitting: 13.3  . Smokeless tobacco: Never Used  . Tobacco comment: occas. cigar quit 2006  Substance Use Topics  . Alcohol use: Yes    Alcohol/week: 0.6 oz    Types: 1 Standard drinks or equivalent per week    Comment: rare  . Drug use: No   Family History  Problem Relation Age of Onset  . Heart disease Mother   . Heart disease Father   . Stroke Father   . Heart attack Son 20  . Kidney disease Neg Hx   . Prostate cancer Neg Hx   . Diabetes Neg Hx    Allergies  Allergen Reactions  . Oxycodone-Acetaminophen     Stops breathing   Current Outpatient Medications on File Prior to Visit  Medication Sig Dispense Refill  . albuterol (VENTOLIN HFA) 108 (90 Base) MCG/ACT inhaler Inhale 2 puffs into the lungs every 4 (four) hours as needed for wheezing or shortness of breath (tight lungs ). 1 Inhaler prn  . aspirin 81 MG tablet 81 mg 2 (two) times daily. Reported  on 12/19/2015    . azelastine (OPTIVAR) 0.05 % ophthalmic solution Place 1 drop into both eyes as needed.    . benzonatate (TESSALON) 100 MG capsule Take 1-2 capsules (100-200 mg total) by mouth 3 (three) times daily  as needed. 30 capsule 0  . CINNAMON PO Take 1,000 mg by mouth 2 (two) times daily.    . furosemide (LASIX) 20 MG tablet take 1 tablet by mouth once daily if needed 30 tablet 3  . Glucosamine HCl (GLUCOSAMINE PO) Take 1,500 mg by mouth 2 (two) times daily. Reported on 12/19/2015    . glucose blood (PRODIGY NO CODING BLOOD GLUC) test strip Check blood sugar 4 times a day and as directed. Dx E11.65 300 each 5  . Lutein 20 MG CAPS Take 1 capsule by mouth daily. Reported on 12/19/2015    . polycarbophil (FIBERCON) 625 MG tablet Take 625 mg by mouth daily. Reported on 12/19/2015     Current Facility-Administered Medications on File Prior to Visit  Medication Dose Route Frequency Provider Last Rate Last Dose  . betamethasone acetate-betamethasone sodium phosphate (CELESTONE) injection 3 mg  3 mg Intramuscular Once Edrick Kins, DPM         Review of Systems  Constitutional: Negative for activity change, appetite change, fatigue, fever and unexpected weight change.  HENT: Negative for congestion, rhinorrhea, sore throat and trouble swallowing.   Eyes: Negative for pain, redness, itching and visual disturbance.  Respiratory: Negative for cough, chest tightness, shortness of breath and wheezing.   Cardiovascular: Negative for chest pain and palpitations.  Gastrointestinal: Positive for constipation. Negative for abdominal pain, blood in stool, diarrhea and nausea.  Endocrine: Negative for cold intolerance, heat intolerance, polydipsia and polyuria.  Genitourinary: Negative for difficulty urinating, dysuria, frequency and urgency.  Musculoskeletal: Positive for neck pain. Negative for arthralgias, joint swelling and myalgias.       Occ neck pain   Skin: Negative for pallor and rash.    Neurological: Negative for dizziness, tremors, weakness, numbness and headaches.  Hematological: Negative for adenopathy. Does not bruise/bleed easily.  Psychiatric/Behavioral: Negative for decreased concentration and dysphoric mood. The patient is not nervous/anxious.        Objective:   Physical Exam  Constitutional: He appears well-developed and well-nourished. No distress.  obese and well appearing    HENT:  Head: Normocephalic and atraumatic.  Right Ear: External ear normal.  Left Ear: External ear normal.  Nose: Nose normal.  Mouth/Throat: Oropharynx is clear and moist.  Eyes: Pupils are equal, round, and reactive to light. Conjunctivae and EOM are normal. Right eye exhibits no discharge. Left eye exhibits no discharge. No scleral icterus.  Neck: Normal range of motion. Neck supple. No JVD present. Carotid bruit is not present. No thyromegaly present.  Cardiovascular: Normal rate, regular rhythm and intact distal pulses. Exam reveals no gallop.  Murmur heard. Pulmonary/Chest: Effort normal and breath sounds normal. No respiratory distress. He has no wheezes. He exhibits no tenderness.  Abdominal: Soft. Bowel sounds are normal. He exhibits no distension, no abdominal bruit and no mass. There is no tenderness.  Musculoskeletal: He exhibits no edema or tenderness.  No kyphosis   Lymphadenopathy:    He has no cervical adenopathy.  Neurological: He is alert. He has normal reflexes. He displays normal reflexes. No cranial nerve deficit. He exhibits normal muscle tone. Coordination normal.  Skin: Skin is warm and dry. No rash noted. No erythema. No pallor.  Solar lentigines diffusely  Some  sks  Psychiatric: He has a normal mood and affect.  Pleasant           Assessment & Plan:   Problem List Items Addressed This Visit      Cardiovascular and Mediastinum   Essential  hypertension (Chronic)    bp in fair control at this time  BP Readings from Last 1 Encounters:   03/16/18 114/72   No changes needed Most recent labs reviewed  Disc lifstyle change with low sodium diet and exercise        Relevant Medications   ezetimibe (ZETIA) 10 MG tablet   metoprolol tartrate (LOPRESSOR) 25 MG tablet   rosuvastatin (CRESTOR) 40 MG tablet     Endocrine   Diabetes mellitus type 2, uncontrolled ([place])    Doing better Lab Results  Component Value Date   HGBA1C 6.2 (A) 02/09/2018   No insulin No prednisone currently  Disc eye and foot care       Relevant Medications   rosuvastatin (CRESTOR) 40 MG tablet     Genitourinary   BPH with obstruction/lower urinary tract symptoms    No complaints Lab Results  Component Value Date   PSA 1.15 03/08/2018   PSA 0.62 02/04/2017   PSA 0.87 02/06/2016   Continue to watch      Renal insufficiency    Stable cr and GFR Avoiding nephrotoxic meds Inc water to 64 oz per day  Continue to follow bp and DM well controlled        Other   Hyperlipidemia    Disc goals for lipids and reasons to control them Rev last labs with pt Rev low sat fat diet in detail  Well controlled with crestor and zetia       Relevant Medications   ezetimibe (ZETIA) 10 MG tablet   metoprolol tartrate (LOPRESSOR) 25 MG tablet   rosuvastatin (CRESTOR) 40 MG tablet   Obesity (BMI 30-39.9)    Discussed how this problem influences overall health and the risks it imposes  Reviewed plan for weight loss with lower calorie diet (via better food choices and also portion control or program like weight watchers) and exercise building up to or more than 30 minutes 5 days per week including some aerobic activity   Commended on wt loss so far      Prostate cancer screening    No symptoms  Lab Results  Component Value Date   PSA 1.15 03/08/2018   PSA 0.62 02/04/2017   PSA 0.87 02/06/2016   continue to follow       Routine general medical examination at a health care facility - Primary    Reviewed health habits including diet and  exercise and skin cancer prevention Reviewed appropriate screening tests for age  Also reviewed health mt list, fam hx and immunization status , as well as social and family history   See HPI amw rev Labs rev  Commended on wt loss and better habits On wait list for shingrix  Disc inc water intake for renal health and constipation

## 2018-03-16 NOTE — Assessment & Plan Note (Signed)
No symptoms  Lab Results  Component Value Date   PSA 1.15 03/08/2018   PSA 0.62 02/04/2017   PSA 0.87 02/06/2016   continue to follow

## 2018-03-16 NOTE — Assessment & Plan Note (Signed)
Reviewed health habits including diet and exercise and skin cancer prevention Reviewed appropriate screening tests for age  Also reviewed health mt list, fam hx and immunization status , as well as social and family history   See HPI amw rev Labs rev  Commended on wt loss and better habits On wait list for shingrix  Disc inc water intake for renal health and constipation

## 2018-03-16 NOTE — Patient Instructions (Addendum)
Get a store brand of miralax for constipation  Water intake is most important - aim for 64 oz per day   Keep working on diet /exercise and weight loss -good job so far

## 2018-03-16 NOTE — Assessment & Plan Note (Addendum)
Stable cr and GFR Avoiding nephrotoxic meds Inc water to 64 oz per day  Continue to follow bp and DM well controlled

## 2018-03-16 NOTE — Assessment & Plan Note (Signed)
Doing better Lab Results  Component Value Date   HGBA1C 6.2 (A) 02/09/2018   No insulin No prednisone currently  Disc eye and foot care

## 2018-03-16 NOTE — Assessment & Plan Note (Signed)
Discussed how this problem influences overall health and the risks it imposes  Reviewed plan for weight loss with lower calorie diet (via better food choices and also portion control or program like weight watchers) and exercise building up to or more than 30 minutes 5 days per week including some aerobic activity   Commended on wt loss so far  

## 2018-03-16 NOTE — Assessment & Plan Note (Signed)
Disc goals for lipids and reasons to control them Rev last labs with pt Rev low sat fat diet in detail  Well controlled with crestor and zetia

## 2018-03-16 NOTE — Assessment & Plan Note (Signed)
No complaints Lab Results  Component Value Date   PSA 1.15 03/08/2018   PSA 0.62 02/04/2017   PSA 0.87 02/06/2016   Continue to watch

## 2018-06-04 DIAGNOSIS — G4733 Obstructive sleep apnea (adult) (pediatric): Secondary | ICD-10-CM | POA: Diagnosis not present

## 2018-08-11 ENCOUNTER — Encounter: Payer: Self-pay | Admitting: Endocrinology

## 2018-08-11 ENCOUNTER — Ambulatory Visit: Payer: PPO | Admitting: Endocrinology

## 2018-08-11 VITALS — BP 124/68 | HR 68 | Ht 72.0 in | Wt 229.4 lb

## 2018-08-11 DIAGNOSIS — E11649 Type 2 diabetes mellitus with hypoglycemia without coma: Secondary | ICD-10-CM

## 2018-08-11 LAB — POCT GLYCOSYLATED HEMOGLOBIN (HGB A1C): Hemoglobin A1C: 6.2 % — AB (ref 4.0–5.6)

## 2018-08-11 NOTE — Patient Instructions (Addendum)
check your blood sugar twice a day.  vary the time of day when you check, between before the 3 meals, and at bedtime.  also check if you have symptoms of your blood sugar being too high or too low.  please keep a record of the readings and bring it to your next appointment here (or you can bring the meter itself).  You can write it on any piece of paper.  please call us sooner if you have a lot of readings under 100 or over 200.  Please call if you go back on the steroids.  Please come back for a follow-up appointment in 6 months.

## 2018-08-11 NOTE — Progress Notes (Signed)
Subjective:    Patient ID: Kurt Aguilar, male    DOB: 1945/10/09, 72 y.o.   MRN: [id number]  HPI Pt returns for f/u of diabetes mellitus: DM type: 2   Dx'ed: 4193 Complications: polyneuropathy, renal insufficiency, and CAD Therapy: no medication now.   DKA: never.  Severe hypoglycemia: never.  Pancreatitis: never.  Other: he takes intermitt prednisone for BOOP; renal insuff limits rx options; he took insulin 2018-2019.   Interval history: He took prednisone x 2 weeks, approx 3 weeks ago.  Past Medical History:  Diagnosis Date  . Arthritis   . Atypical migraine   . Diabetes ([place])   . Diverticulitis   . GERD (gastroesophageal reflux disease)   . Gout   . Heart attack ([place])   . Heart disease   . Hyperglycemia   . Hyperlipidemia   . Hypertension   . Osteoporosis   . Sleep apnea     Past Surgical History:  Procedure Laterality Date  . CATARACT EXTRACTION Bilateral   . CORONARY ARTERY BYPASS GRAFT     [place]  . open heart surgery  09/09/1995-1998   5 bypass  . REPAIR KNEE LIGAMENT     right  . TONSILLECTOMY      Social History   Socioeconomic History  . Marital status: Married    Spouse name: Not on file  . Number of children: 0  . Years of education: Not on file  . Highest education level: Not on file  Occupational History  . Occupation: Armed forces technical officer: RETIRED  Social Needs  . Financial resource strain: Not on file  . Food insecurity:    Worry: Not on file    Inability: Not on file  . Transportation needs:    Medical: Not on file    Non-medical: Not on file  Tobacco Use  . Smoking status: Former Smoker    Packs/day: 1.00    Years: 3.00    Pack years: 3.00    Types: Cigars    Last attempt to quit: 11/25/2004    Years since quitting: 13.7  . Smokeless tobacco: Never Used  . Tobacco comment: occas. cigar quit 2006  Substance and Sexual Activity  . Alcohol use: Yes    Alcohol/week: 1.0 standard drinks    Types: 1  Standard drinks or equivalent per week    Comment: rare  . Drug use: No  . Sexual activity: Never  Lifestyle  . Physical activity:    Days per week: Not on file    Minutes per session: Not on file  . Stress: Not on file  Relationships  . Social connections:    Talks on phone: Not on file    Gets together: Not on file    Attends religious service: Not on file    Active member of club or organization: Not on file    Attends meetings of clubs or organizations: Not on file    Relationship status: Not on file  . Intimate partner violence:    Fear of current or ex partner: Not on file    Emotionally abused: Not on file    Physically abused: Not on file    Forced sexual activity: Not on file  Other Topics Concern  . Not on file  Social History Narrative  . Not on file    Current Outpatient Medications on File Prior to Visit  Medication Sig Dispense Refill  . albuterol (VENTOLIN HFA) 108 (90 Base) MCG/ACT inhaler Inhale  2 puffs into the lungs every 4 (four) hours as needed for wheezing or shortness of breath (tight lungs ). 1 Inhaler prn  . allopurinol (ZYLOPRIM) 100 MG tablet Take 1 tablet (100 mg total) by mouth 2 (two) times daily. 180 tablet 3  . aspirin 81 MG tablet 81 mg 2 (two) times daily. Reported on 12/19/2015    . azelastine (OPTIVAR) 0.05 % ophthalmic solution Place 1 drop into both eyes as needed.    . benzonatate (TESSALON) 100 MG capsule Take 1-2 capsules (100-200 mg total) by mouth 3 (three) times daily as needed. 30 capsule 0  . CINNAMON PO Take 1,000 mg by mouth 2 (two) times daily.    . colchicine 0.6 MG tablet TAKE 1 TABLET BY MOUTH TWICE A DAY WITH A MEAL AS NEEDED FOR ACUTE FLARE OF GOUT 45 tablet 3  . ezetimibe (ZETIA) 10 MG tablet Take 1 tablet (10 mg total) by mouth daily. 90 tablet 3  . furosemide (LASIX) 20 MG tablet take 1 tablet by mouth once daily if needed 30 tablet 3  . Glucosamine HCl (GLUCOSAMINE PO) Take 1,500 mg by mouth 2 (two) times daily. Reported  on 12/19/2015    . glucose blood (PRODIGY NO CODING BLOOD GLUC) test strip Check blood sugar 4 times a day and as directed. Dx E11.65 300 each 5  . Lutein 20 MG CAPS Take 1 capsule by mouth daily. Reported on 12/19/2015    . metoprolol tartrate (LOPRESSOR) 25 MG tablet Take 1 tablet (25 mg total) by mouth 2 (two) times daily. 180 tablet 3  . omeprazole (PRILOSEC) 20 MG capsule Take 1 capsule (20 mg total) by mouth daily. 90 capsule 3  . polyethylene glycol (MIRALAX / GLYCOLAX) packet Take 17 g by mouth daily.    . rosuvastatin (CRESTOR) 40 MG tablet Take 1 tablet (40 mg total) by mouth daily. 90 tablet 3  . polycarbophil (FIBERCON) 625 MG tablet Take 625 mg by mouth daily. Reported on 12/19/2015     Current Facility-Administered Medications on File Prior to Visit  Medication Dose Route Frequency Provider Last Rate Last Dose  . betamethasone acetate-betamethasone sodium phosphate (CELESTONE) injection 3 mg  3 mg Intramuscular Once Edrick Kins, DPM        Allergies  Allergen Reactions  . Oxycodone-Acetaminophen     Stops breathing    Family History  Problem Relation Age of Onset  . Heart disease Mother   . Heart disease Father   . Stroke Father   . Heart attack Son 33  . Kidney disease Neg Hx   . Prostate cancer Neg Hx   . Diabetes Neg Hx     BP 124/68 (BP Location: Left Arm, Patient Position: Sitting, Cuff Size: Normal)   Pulse 68   Ht 6' (1.829 m)   Wt 229 lb 6.4 oz (104.1 kg)   SpO2 93%   BMI 31.11 kg/m    Review of Systems He has gained a few lbs    Objective:   Physical Exam VITAL SIGNS:  See vs page.  GENERAL: no distress. Pulses: foot pulses are intact bilaterally.   MSK: no deformity of the feet or ankles.  CV: trace bilat leg edema.  Skin:  no ulcer on the feet or ankles.  normal color and temp on the feet and ankles.  Old healed surgical scars on both legs (vein harvest).  Neuro: sensation is intact to touch on the feet and ankles.  A1c=6.2%  Lab Results  Component Value Date   CREATININE 1.49 03/08/2018   BUN 31 (H) 03/08/2018   NA 141 03/08/2018   K 4.8 03/08/2018   CL 102 03/08/2018   CO2 31 03/08/2018       Assessment & Plan:  Type 2 DM: with renal insuff BOOP: he may need DM medication if he goes back on steroids  Patient Instructions  check your blood sugar twice a day.  vary the time of day when you check, between before the 3 meals, and at bedtime.  also check if you have symptoms of your blood sugar being too high or too low.  please keep a record of the readings and bring it to your next appointment here (or you can bring the meter itself).  You can write it on any piece of paper.  please call us sooner if you have a lot of readings under 100 or over 200.  Please call if you go back on the steroids.  Please come back for a follow-up appointment in 6 months.

## 2018-08-12 ENCOUNTER — Ambulatory Visit (INDEPENDENT_AMBULATORY_CARE_PROVIDER_SITE_OTHER): Payer: PPO | Admitting: Adult Health

## 2018-08-12 ENCOUNTER — Ambulatory Visit (INDEPENDENT_AMBULATORY_CARE_PROVIDER_SITE_OTHER)
Admission: RE | Admit: 2018-08-12 | Discharge: 2018-08-12 | Disposition: A | Discharge status: Home / Self Care | Payer: PPO | Source: Ambulatory Visit | Attending: Adult Health | Admitting: Adult Health

## 2018-08-12 ENCOUNTER — Encounter: Payer: Self-pay | Admitting: Adult Health

## 2018-08-12 VITALS — BP 128/60 | HR 69 | Temp 98.0°F | Ht 70.0 in | Wt 227.8 lb

## 2018-08-12 DIAGNOSIS — R918 Other nonspecific abnormal finding of lung field: Secondary | ICD-10-CM | POA: Diagnosis not present

## 2018-08-12 DIAGNOSIS — R059 Cough, unspecified: Secondary | ICD-10-CM

## 2018-08-12 DIAGNOSIS — J209 Acute bronchitis, unspecified: Secondary | ICD-10-CM | POA: Diagnosis not present

## 2018-08-12 DIAGNOSIS — R05 Cough: Secondary | ICD-10-CM

## 2018-08-12 DIAGNOSIS — R0602 Shortness of breath: Secondary | ICD-10-CM | POA: Diagnosis not present

## 2018-08-12 LAB — CBC WITH DIFFERENTIAL/PLATELET
Basophils Absolute: 0.1 10*3/uL (ref 0.0–0.1)
Basophils Relative: 1 % (ref 0.0–3.0)
Eosinophils Absolute: 0.4 10*3/uL (ref 0.0–0.7)
Eosinophils Relative: 4.8 % (ref 0.0–5.0)
HCT: 43.7 % (ref 39.0–52.0)
HEMOGLOBIN: 14.7 g/dL (ref 13.0–17.0)
Lymphocytes Relative: 35.6 % (ref 12.0–46.0)
Lymphs Abs: 2.9 10*3/uL (ref 0.7–4.0)
MCHC: 33.6 g/dL (ref 30.0–36.0)
MCV: 93.2 fl (ref 78.0–100.0)
MONOS PCT: 8.9 % (ref 3.0–12.0)
Monocytes Absolute: 0.7 10*3/uL (ref 0.1–1.0)
Neutro Abs: 4.1 10*3/uL (ref 1.4–7.7)
Neutrophils Relative %: 49.7 % (ref 43.0–77.0)
Platelets: 290 10*3/uL (ref 150.0–400.0)
RBC: 4.69 Mil/uL (ref 4.22–5.81)
RDW: 13.8 % (ref 11.5–15.5)
WBC: 8.2 10*3/uL (ref 4.0–10.5)

## 2018-08-12 LAB — BASIC METABOLIC PANEL
BUN: 29 mg/dL — ABNORMAL HIGH (ref 6–23)
CO2: 28 mEq/L (ref 19–32)
Calcium: 9.7 mg/dL (ref 8.4–10.5)
Chloride: 103 mEq/L (ref 96–112)
Creatinine, Ser: 1.79 mg/dL — ABNORMAL HIGH (ref 0.40–1.50)
GFR: 39.56 mL/min — ABNORMAL LOW (ref >60.00)
GLUCOSE: 105 mg/dL — AB (ref 70–99)
Potassium: 4.9 mEq/L (ref 3.5–5.1)
Sodium: 138 mEq/L (ref 135–145)

## 2018-08-12 LAB — SEDIMENTATION RATE: Sed Rate: 16 mm/hr (ref 0–20)

## 2018-08-12 MED ORDER — AMOXICILLIN-POT CLAVULANATE 875-125 MG PO TABS: 1.0000 | ORAL_TABLET | Freq: Two times a day (BID) | ORAL | 0 refills | Status: AC

## 2018-08-12 MED ORDER — BENZONATATE 200 MG PO CAPS: 200.0000 mg | ORAL_CAPSULE | Freq: Three times a day (TID) | ORAL | 3 refills | Status: DC | PRN

## 2018-08-12 MED ORDER — PREDNISONE 10 MG PO TABS: ORAL_TABLET | ORAL | 0 refills | Status: DC

## 2018-08-12 NOTE — Patient Instructions (Signed)
Augmentin 875mg  Twice daily  For 1 week , take w/ food .  Mucinex DM Twice daily  As needed  Cough/congestion .  Tessalon Three times a day  As needed  Cough .  Begin Prednisone 20mg  daily , take with food.  Chest xray today  Labs today .  Follow up with Dr. [name]  In 2 weeks and As needed   Please contact office for sooner follow up if symptoms do not improve or worsen or seek emergency care

## 2018-08-12 NOTE — Assessment & Plan Note (Signed)
Slow to resolve acute bronchitis.  Patient does have a history of BOOP in the past.  Will check labs with an ESR and chest x-ray.  Will treat empirically with antibiotics and steroids. If cxr and ESR ok will taper off steroids prior to next ov    Plan  Patient Instructions  Augmentin 856m Twice daily  For 1 week , take w/ food .  Mucinex DM Twice daily  As needed  Cough/congestion .  Tessalon Three times a day  As needed  Cough .  Begin Prednisone 27mdaily , take with food.  Chest xray today  Labs today .  Follow up with Dr. WeMelvyn NovasIn 2 weeks and As needed   Please contact office for sooner follow up if symptoms do not improve or worsen or seek emergency care

## 2018-08-12 NOTE — Progress Notes (Signed)
_0  ID: Kurt Aguilar, male    DOB: Jun 10, 1946, 72 y.o.   MRN: [id number]  Chief Complaint  Patient presents with  . Acute Visit    Cough     Referring provider: [name], MD  HPI: 72 year old male former smoker (cigars only) followed for BOOP  Medical history significant for coronary disease status post CABG  TEST/EVENTS :  Baseline PFTs 03/18/10  FVC  3.59 (80%) with ERV 67% and dlco 75 corrects to 106 Onset of symptoms feb 2017 with "photo neg pumonary edema pattern" - Allergy profile 12/19/15 >  Eos 0.2 /  IgE  89/ Pos Cat RAST only  12/19/2015  ESR 93 > started on prednisone 20 mg daily  HSP profile 12/19/15 >  Neg  - 01/07/2016 ESR down to 18 but only 30% improved symptomatically > rec continue 20 mg daily until better then 10 mg daily  - 02/01/2016 rec taper off x 6 weeks then ov > 05/16/2016 ESR = 58> restarted pred 06/03/16  - Collagen Vasc dz screen 07/29/2016  Neg  - PFT's  11/11/2016  FEV1 1.91 (56 % ) ratio 72  p 7 % improvement from saba p nothing prior to study with DLCO  42/43 % corrects to 77 % for alv volume but ERV 35%  - ESR  20 on 10 a/w 15 as  Of 11/11/2016 > rec try 10 mg daily  - Prednisone 5 mg daily as of 12/15/16 > taper to 5 mg qod by 02/10/2017 - 02/10/2017 ESR =  28  rec taper off x 2 weeks > no flare - 04/20/2017 ESR = 32 off pred since 02/2017   08/12/2018 Acute OV : Cough  Patient presents for an acute office visit. Complains of 4 weeks of cough, congestion with thick mucus.  Has significant coughing paroxysms on and off.  Is been using Mucinex DM without much relief.  Patient says he started some leftover prednisone for about 8 days which did help some.  Has some sinus drainage.  Denies any fever, hemoptysis, chest pain, orthopnea or edema.  Patient was last seen 1 year ago.  He has a history of BOOP that began in 2017.  At onset had elevated ESR.  He responded to prednisone.  Collagen vascular screening was negative.  PFTs showed moderate restriction.   Patient improved gradually and was weaned off of steroids in June 2018.  Chest x-ray November 2018 showed no infiltrates.  Stable pleural thickening bilaterally.  Says he is very active tries to eat healthy.  And exercises on a regular basis.  Allergies  Allergen Reactions  . Oxycodone-Acetaminophen     Stops breathing    Immunization History  Administered Date(s) Administered  . Influenza Split 07/09/2011  . Influenza Whole 05/14/2010, 06/09/2012  . Influenza, High Dose Seasonal PF 05/22/2014, 07/04/2015, 05/16/2016, 07/09/2018  . Influenza,inj,Quad PF,6+ Mos 07/11/2013  . Influenza-Unspecified 07/03/2017  . Pneumococcal Conjugate-13 08/07/2014  . Pneumococcal Polysaccharide-23 09/08/2002, 07/11/2013  . Td 09/08/2004, 02/12/2016  . Zoster 07/09/2011    Past Medical History:  Diagnosis Date  . Arthritis   . Atypical migraine   . Diabetes ([place])   . Diverticulitis   . GERD (gastroesophageal reflux disease)   . Gout   . Heart attack ([place])   . Heart disease   . Hyperglycemia   . Hyperlipidemia   . Hypertension   . Osteoporosis   . Sleep apnea     Tobacco History: Social History   Tobacco Use  Smoking Status Former Smoker  . Packs/day: 1.00  . Years: 3.00  . Pack years: 3.00  . Types: Cigars  . Last attempt to quit: 11/25/2004  . Years since quitting: 13.7  Smokeless Tobacco Never Used  Tobacco Comment   occas. cigar quit 2006   Counseling given: Not Answered Comment: occas. cigar quit 2006   Outpatient Medications Prior to Visit  Medication Sig Dispense Refill  . albuterol (VENTOLIN HFA) 108 (90 Base) MCG/ACT inhaler Inhale 2 puffs into the lungs every 4 (four) hours as needed for wheezing or shortness of breath (tight lungs ). 1 Inhaler prn  . allopurinol (ZYLOPRIM) 100 MG tablet Take 1 tablet (100 mg total) by mouth 2 (two) times daily. 180 tablet 3  . aspirin 81 MG tablet 81 mg 2 (two) times daily. Reported on 12/19/2015    . azelastine (OPTIVAR) 0.05 %  ophthalmic solution Place 1 drop into both eyes as needed.    . benzonatate (TESSALON) 100 MG capsule Take 1-2 capsules (100-200 mg total) by mouth 3 (three) times daily as needed. 30 capsule 0  . CINNAMON PO Take 1,000 mg by mouth 2 (two) times daily.    . colchicine 0.6 MG tablet TAKE 1 TABLET BY MOUTH TWICE A DAY WITH A MEAL AS NEEDED FOR ACUTE FLARE OF GOUT 45 tablet 3  . ezetimibe (ZETIA) 10 MG tablet Take 1 tablet (10 mg total) by mouth daily. 90 tablet 3  . furosemide (LASIX) 20 MG tablet take 1 tablet by mouth once daily if needed 30 tablet 3  . Glucosamine HCl (GLUCOSAMINE PO) Take 1,500 mg by mouth 2 (two) times daily. Reported on 12/19/2015    . glucose blood (PRODIGY NO CODING BLOOD GLUC) test strip Check blood sugar 4 times a day and as directed. Dx E11.65 300 each 5  . Lutein 20 MG CAPS Take 1 capsule by mouth daily. Reported on 12/19/2015    . metoprolol tartrate (LOPRESSOR) 25 MG tablet Take 1 tablet (25 mg total) by mouth 2 (two) times daily. 180 tablet 3  . omeprazole (PRILOSEC) 20 MG capsule Take 1 capsule (20 mg total) by mouth daily. 90 capsule 3  . polycarbophil (FIBERCON) 625 MG tablet Take 625 mg by mouth daily. Reported on 12/19/2015    . polyethylene glycol (MIRALAX / GLYCOLAX) packet Take 17 g by mouth daily.    . rosuvastatin (CRESTOR) 40 MG tablet Take 1 tablet (40 mg total) by mouth daily. 90 tablet 3   Facility-Administered Medications Prior to Visit  Medication Dose Route Frequency Provider Last Rate Last Dose  . betamethasone acetate-betamethasone sodium phosphate (CELESTONE) injection 3 mg  3 mg Intramuscular Once Edrick Kins, DPM         Review of Systems  Constitutional:   No  weight loss, night sweats,  Fevers, chills, + fatigue, or  lassitude.  HEENT:   No headaches,  Difficulty swallowing,  Tooth/dental problems, or  Sore throat,                No sneezing, itching, ear ache, nasal congestion, post nasal drip,   CV:  No chest pain,  Orthopnea, PND,  swelling in lower extremities, anasarca, dizziness, palpitations, syncope.   GI  No heartburn, indigestion, abdominal pain, nausea, vomiting, diarrhea, change in bowel habits, loss of appetite, bloody stools.   Resp:   No chest wall deformity  Skin: no rash or lesions.  GU: no dysuria, change in color of urine, no urgency or frequency.  No flank pain, no hematuria   MS:  No joint pain or swelling.  No decreased range of motion.  No back pain.    Physical Exam  BP 128/60 (BP Location: Left Arm, Cuff Size: Normal)   Pulse 69   Temp 98 F (36.7 C) (Oral)   Ht _0  (1.778 m)   Wt 227 lb 12.8 oz (103.3 kg)   SpO2 95%   BMI 32.69 kg/m   GEN: A/Ox3; pleasant , NAD, elderly   HEENT:  [place]/AT,  EACs-clear, TMs-wnl, NOSE-clear, THROAT-clear, no lesions, no postnasal drip or exudate noted.   NECK:  Supple w/ fair ROM; no JVD; normal carotid impulses w/o bruits; no thyromegaly or nodules palpated; no lymphadenopathy.    RESP  Few trace rhonchi ,no accessory muscle use, no dullness to percussion  CARD:  RRR, no m/r/g, no peripheral edema, pulses intact, no cyanosis or clubbing.  GI:   Soft & nt; nml bowel sounds; no organomegaly or masses detected.   Musco: Warm bil, no deformities or joint swelling noted.   Neuro: alert, no focal deficits noted.    Skin: Warm, no lesions or rashes    Lab Results:  CBC  No results found for: BNP  ProBNP    Component Value Date/Time   PROBNP 24.0 11/11/2016 1048    Imaging: Dg Chest 2 View  Result Date: 08/12/2018 CLINICAL DATA:  Cough and shortness of breath for several weeks EXAM: CHEST - 2 VIEW COMPARISON:  07/29/2017 FINDINGS: Cardiac shadow is stable. Postsurgical changes are again noted. Aortic calcifications are seen. Lungs are well aerated bilaterally without focal infiltrate or sizable effusion. No acute bony abnormality is noted. IMPRESSION: No acute abnormality noted. Electronically Signed   By: Inez Catalina M.D.   On:  08/12/2018 13:27      PFT Results Latest Ref Rng & Units 11/11/2016  FVC-Pre L 2.53  FVC-Predicted Pre % 54  FVC-Post L 2.66  FVC-Predicted Post % 57  Pre FEV1/FVC % % 70  Post FEV1/FCV % % 72  FEV1-Pre L 1.77  FEV1-Predicted Pre % 52  FEV1-Post L 1.91  DLCO UNC% % 42  DLCO COR %Predicted % 77  TLC L 4.71  TLC % Predicted % 63  RV % Predicted % 76    No results found for: NITRICOXIDE      Assessment & Plan:   Acute bronchitis Slow to resolve acute bronchitis.  Patient does have a history of BOOP in the past.  Will check labs with an ESR and chest x-ray.  Will treat empirically with antibiotics and steroids. If cxr and ESR ok will taper off steroids prior to next ov    Plan  Patient Instructions  Augmentin 866m Twice daily  For 1 week , take w/ food .  Mucinex DM Twice daily  As needed  Cough/congestion .  Tessalon Three times a day  As needed  Cough .  Begin Prednisone 267mdaily , take with food.  Chest xray today  Labs today .  Follow up with Dr. WeMelvyn NovasIn 2 weeks and As needed   Please contact office for sooner follow up if symptoms do not improve or worsen or seek emergency care       Pulmonary infiltrates with high  ESR c/w BOOP/ idiopathic  Hx of BOOP in 2017 requiring steroids for ~1 year . Improved clinically .  Patient with persistent cough over the last 4 weeks.  Will check a chest x-ray and labs with  ESR.  Empirically treat with steroids for now.  If ESR and chest x-ray are okay will taper to off.  Plan  Patient Instructions  Augmentin 832m Twice daily  For 1 week , take w/ food .  Mucinex DM Twice daily  As needed  Cough/congestion .  Tessalon Three times a day  As needed  Cough .  Begin Prednisone 255mdaily , take with food.  Chest xray today  Labs today .  Follow up with Dr. WeMelvyn NovasIn 2 weeks and As needed   Please contact office for sooner follow up if symptoms do not improve or worsen or seek emergency care          TaRexene Edison NP 08/12/2018

## 2018-08-12 NOTE — Assessment & Plan Note (Signed)
Hx of BOOP in 2017 requiring steroids for ~1 year . Improved clinically .  Patient with persistent cough over the last 4 weeks.  Will check a chest x-ray and labs with ESR.  Empirically treat with steroids for now.  If ESR and chest x-ray are okay will taper to off.  Plan  Patient Instructions  Augmentin 850m Twice daily  For 1 week , take w/ food .  Mucinex DM Twice daily  As needed  Cough/congestion .  Tessalon Three times a day  As needed  Cough .  Begin Prednisone 281mdaily , take with food.  Chest xray today  Labs today .  Follow up with Dr. WeMelvyn NovasIn 2 weeks and As needed   Please contact office for sooner follow up if symptoms do not improve or worsen or seek emergency care

## 2018-08-13 ENCOUNTER — Encounter: Payer: Self-pay | Admitting: Adult Health

## 2018-08-16 NOTE — Progress Notes (Signed)
Called spoke with patient, advised of cxr results / recs as stated by TP.  Pt verbalized understanding and denied any questions.

## 2018-08-23 ENCOUNTER — Telehealth: Payer: Self-pay | Admitting: *Deleted

## 2018-08-23 NOTE — Progress Notes (Signed)
Chart and office note reviewed in detail  > agree with a/p as outlined    

## 2018-08-23 NOTE — Telephone Encounter (Signed)
Spoke to [facility] who states pt is now wanting to have his metoprolol moved to their location. She will contact [facility] and have the remaining refills transferred.

## 2018-08-26 ENCOUNTER — Ambulatory Visit (INDEPENDENT_AMBULATORY_CARE_PROVIDER_SITE_OTHER): Payer: PPO | Admitting: Internal Medicine

## 2018-08-26 ENCOUNTER — Encounter: Payer: Self-pay | Admitting: Internal Medicine

## 2018-08-26 VITALS — BP 138/64 | HR 65 | Ht 70.5 in | Wt 222.0 lb

## 2018-08-26 DIAGNOSIS — R05 Cough: Secondary | ICD-10-CM | POA: Diagnosis not present

## 2018-08-26 DIAGNOSIS — R918 Other nonspecific abnormal finding of lung field: Secondary | ICD-10-CM

## 2018-08-26 DIAGNOSIS — R058 Other specified cough: Secondary | ICD-10-CM

## 2018-08-26 NOTE — Progress Notes (Signed)
Subjective:    Patient ID: Kurt Aguilar, male    DOB: October 24, 1945,   MRN: [id number]    Brief patient profile:  41 yowm never cigarette  Smoker(just some cigars)  after cabg in [place] started noting recurrent winter cough regardless of where located in [place] ([place]/ [place]) with typical acute episode in [place] in Feb 2017  This  cough  more dry less severe than previous >  abx and pred > improved but did not resolve and worse when stopped it so restarted pred/abx > dx as pneumonia with variable as dz > referred to pulmonary clinic 12/19/2015 by [name] with 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   History of Present Illness  12/19/2015 1st [facility] office visit/ Bruce Churilla   Chief Complaint  Patient presents with  . Pulmonary Consult    Referred by [name]. Pt c/o cough x 4 wks- non prod and worse in the evening and when he lies down. Talking and exertion are things that trigger the cough. He has been on pred x 2 and both times cough resolved and immediately returns once done with med. He also c/o SOB- gets winded just walking from room to room at home.   while on prednisone still some weak and sob but better cough and last dose at least one week and worse overall since off it assoc with sob room to room and weakness/ excess/ purulent sputum or mucus plugs / no unusual exp / no ctd.  Has new CPAP machine fall 2016  rec  Omeprazole 20 mg Take 30- 60 min before your first and last meals of the day  Prednisone 10  X 2 each day until 100% better then 10 mg daily  Dx is Brochilitis obliterans with organizing pneumonia vs eosiniphic pneumonia most likely diagnosis GERD  Diet       02/01/2016  f/u ov/Dimitra Woodstock re: boop tapered pred to 10 mg as 01/28/16  Chief Complaint  Patient presents with  . Follow-up    pt states he is much improved since last office visit.  SOB has resolved.    Not limited by breathing from desired activities / concerned about wt gain from pred  rec Prednisone  10 mg with bfast x 2 week then 5 mg x 2 weeks then 5 mg even days x 2 weeks and stop  If breathing/cough worse or nausea resume previous dose    09/11/2016  f/u ov/Ester Hilley re: BOOP/uacs  on pred 10/5  prilosec 20 mg ac and hs  Chief Complaint  Patient presents with  . Follow-up    Increased SOB for the past 3-4 days. He gets SOB with exertion such as getting dressed or walking accross the room.  He has also started coughing more "feels like I burned my lungs"- prod with white sputum.     was doing ok on 10/5 in terms of breathing but worse gradually x one week p several weeks on the 10/5 dose  Cough some better but still needing freq tessalon on gabapentin 100 tid  rec Prednisone 10 mg  2 daily until better then 1 daily x 2 weeks then 10 alternating with 5 until return Prilosec should be 20 mg Take 30- 60 min before your first and last meals of the day  Increase gabapentin to 300 mg three times daily   .       02/10/2017  f/u ov/Lazariah Savard re:  PF / boop down to 10 mg one half qod and maint on  gabapentin 300 tid for UACS Chief Complaint  Patient presents with  . Follow-up    Pt states "doing great"- doing well on the pred 5 mg every other day.    no change ex tol with taper / no cough/ no nausea even on days off  rec Prednisone 5mg  one half every other day x 2 weeks and then stop -      04/15/2017  f/u ov/Jonel Sick re:  PF/ boop / ? Irritable larynx  Chief Complaint  Patient presents with  . Follow-up    Pt states developed a cold mid July 2018 while traveling to [place]. He is feeling better now and not coughing much. No new co's today.   walked all over [place] up hills/ in heat no problem with sob   Still occ throat clearing better on gabapentin 300 tid and gerd rx / daytime only  rec Once the protonix runs out,  Ok to try omeprazole 20 mg before bfrast and pepcid at bedtime until you see [name]  Once you run out of gabapentin ok to leave it off and call if refills needed              08/12/18  NP cough? Uri  Augmentin 875mg  Twice daily  For 1 week , take w/ food .  Mucinex DM Twice daily  As needed  Cough/congestion .  Tessalon Three times a day  As needed  Cough .  Begin Prednisone 20mg  daily , take with food.  Chest xray  ok      08/26/2018  f/u ov/Halleigh Comes re: flare of cough related to uri, much better  Chief Complaint  Patient presents with  . Follow-up    Cough is much improved. He very rarely uses his albuterol inhaler.   Dyspnea:  Pool x 2.5 h Cough: gone/ still has urge to clear to clear throat with sense of throat and chest "congestion" but no mucus Sleeping: ok / adjustable x 8 in hob SABA use: none  02: none    No obvious day to day or daytime variability or assoc excess/ purulent sputum or mucus plugs or hemoptysis or cp or chest tightness, subjective wheeze or overt sinus or hb symptoms.   Sleeping fine as above  without nocturnal  or early am exacerbation  of respiratory  c/o's or need for noct saba. Also denies any obvious fluctuation of symptoms with weather or environmental changes or other aggravating or alleviating factors except as outlined above   No unusual exposure hx or h/o childhood pna/ asthma or knowledge of premature birth.  Current Allergies, Complete Past Medical History, Past Surgical History, Family History, and Social History were reviewed in [employer] record.  ROS  The following are not active complaints unless bolded Hoarseness, sore throat, dysphagia, dental problems, itching, sneezing,  nasal congestion or discharge of excess mucus or purulent secretions, ear ache,   fever, chills, sweats, unintended wt loss or wt gain, classically pleuritic or exertional cp,  orthopnea pnd or arm/hand swelling  or leg swelling, presyncope, palpitations, abdominal pain, anorexia, nausea, vomiting, diarrhea  or change in bowel habits or change in bladder habits, change in stools or change in urine, dysuria, hematuria,   rash, arthralgias, visual complaints, headache, numbness, weakness or ataxia or problems with walking or coordination,  change in mood or  memory.        Current Meds  Medication Sig  . albuterol (VENTOLIN HFA) 108 (90 Base) MCG/ACT inhaler Inhale 2 puffs  into the lungs every 4 (four) hours as needed for wheezing or shortness of breath (tight lungs ).  Marland Kitchen allopurinol (ZYLOPRIM) 100 MG tablet Take 1 tablet (100 mg total) by mouth 2 (two) times daily.  Marland Kitchen aspirin 81 MG tablet 81 mg 2 (two) times daily. Reported on 12/19/2015  . azelastine (OPTIVAR) 0.05 % ophthalmic solution Place 1 drop into both eyes as needed.  . benzonatate (TESSALON) 100 MG capsule Take 1-2 capsules (100-200 mg total) by mouth 3 (three) times daily as needed.  . benzonatate (TESSALON) 200 MG capsule Take 1 capsule (200 mg total) by mouth 3 (three) times daily as needed for cough.  Marland Kitchen CINNAMON PO Take 1,000 mg by mouth 2 (two) times daily.  . colchicine 0.6 MG tablet TAKE 1 TABLET BY MOUTH TWICE A DAY WITH A MEAL AS NEEDED FOR ACUTE FLARE OF GOUT  . ezetimibe (ZETIA) 10 MG tablet Take 1 tablet (10 mg total) by mouth daily.  . furosemide (LASIX) 20 MG tablet take 1 tablet by mouth once daily if needed  . Glucosamine HCl (GLUCOSAMINE PO) Take 1,500 mg by mouth 2 (two) times daily. Reported on 12/19/2015  . glucose blood (PRODIGY NO CODING BLOOD GLUC) test strip Check blood sugar 4 times a day and as directed. Dx E11.65  . Lutein 20 MG CAPS Take 1 capsule by mouth daily. Reported on 12/19/2015  . metoprolol tartrate (LOPRESSOR) 25 MG tablet Take 1 tablet (25 mg total) by mouth 2 (two) times daily.  Marland Kitchen omeprazole (PRILOSEC) 20 MG capsule Take 1 capsule (20 mg total) by mouth daily.  . polycarbophil (FIBERCON) 625 MG tablet Take 625 mg by mouth daily. Reported on 12/19/2015  . polyethylene glycol (MIRALAX / GLYCOLAX) packet Take 17 g by mouth daily.  . predniSONE (DELTASONE) 10 MG tablet 2 tabs daily (Patient taking differently: 1 tab  daily)  . rosuvastatin (CRESTOR) 40 MG tablet Take 1 tablet (40 mg total) by mouth daily.           Objective:   Physical Exam  Pleasant amb wm nad      08/26/2018      222 07/29/2017      232  04/15/2017          243  02/10/2017          250  12/19/2016        250 11/11/2016          258  09/11/2016          252  07/29/2016      244  05/16/2016          238  02/01/2016        235  01/07/2016          228   12/19/15 229 lb 4 oz (103.987 kg)  12/19/15 229 lb 4 oz (103.987 kg)  12/19/15 227 lb (102.967 kg)      Vital signs reviewed - Note on arrival 02 sats  97% on RA    HEENT: nl dentition, turbinates bilaterally, and oropharynx. Nl external ear canals without cough reflex   NECK :  without JVD/Nodes/TM/ nl carotid upstrokes bilaterally   LUNGS: no acc muscle use,  Nl contour chest which is clear to A and P bilaterally without cough on insp or exp maneuvers   CV:  RRR  no s3 or murmur or increase in P2, and no edema   ABD:  Obese/ soft and nontender with nl inspiratory excursion in the  supine position. No bruits or organomegaly appreciated, bowel sounds nl  MS:  Nl gait/ ext warm without deformities, calf tenderness, cyanosis or clubbing No obvious joint restrictions   SKIN: warm and dry without lesions    NEURO:  alert, approp, nl sensorium with  no motor or cerebellar deficits apparent.      I personally reviewed images and agree with radiology impression as follows:  CXR:   08/12/18 No acute abnormality noted.     Lab Results  Component Value Date   ESRSEDRATE 16 08/12/2018   ESRSEDRATE 32 (H) 04/20/2017   ESRSEDRATE 28 (H) 02/10/2017            Assessment & Plan:

## 2018-08-26 NOTE — Patient Instructions (Signed)
Prednisone  10 mg take a half x 3 days and stop   Whenever coughing , omeprazole 40 mg Take 30-60 min before first meal of the day and take pepcid 20 mg after supper (if not improving then omeprazole should be:  Take 30-60 min before first meal of the day )  For drainage / throat tickle try take CHLORPHENIRAMINE  4 mg - take one every 4 hours as needed - available over the counter- may cause drowsiness so start with just a bedtime dose or two and see how you tolerate it before trying in daytime    Pulmonary follow up is as needed

## 2018-08-30 ENCOUNTER — Encounter: Payer: Self-pay | Admitting: Internal Medicine

## 2018-08-30 NOTE — Assessment & Plan Note (Signed)
Baseline PFTs 03/18/10  FVC  3.59 (80%) with ERV 67% and dlco 75 corrects to 106 Onset of symptoms feb 2017 with "photo neg pumonary edema pattern" - Allergy profile 12/19/15 >  Eos 0.2 /  IgE  89/ Pos Cat RAST only  12/19/2015  ESR 93 > started on prednisone 20 mg daily  HSP profile 12/19/15 >  Neg  - 01/07/2016 ESR down to 18 but only 30% improved symptomatically > rec continue 20 mg daily until better then 10 mg daily  - 02/01/2016 rec taper off x 6 weeks then ov > 05/16/2016 ESR = 58> restarted pred 06/03/16  - Collagen Vasc dz screen 07/29/2016  Neg  - PFT's  11/11/2016  FEV1 1.91 (56 % ) ratio 72  p 7 % improvement from saba p nothing prior to study with DLCO  42/43 % corrects to 77 % for alv volume but ERV 35%  - ESR  20 on 10 a/w 15 as  Of 11/11/2016 > rec try 10 mg daily  - Prednisone 5 mg daily as of 12/15/16 > taper to 5 mg qod by 02/10/2017 - 02/10/2017 ESR =  28  rec taper off x 2 weeks > no flare - 04/20/2017 ESR = 32 off pred since 02/2017  - 08/12/18  ESR = 16 with onset of cough attributable to uri  No evidence of recurrent boop > ok to take rapidly off prednisone and f/u if worse. see avs for instructions unique to this ov   

## 2018-08-30 NOTE — Assessment & Plan Note (Signed)
Trial of gabapentin 100 tid 07/29/2016 >  Still tessalon dep 09/11/2016 so increased to 300 tid and ppi bid ac > not improved 11/11/2016 > rec max gerd rx and sugarless candy > resolved 12/19/2016  And off gabapentin 8/201   Of the three most common causes of  Sub-acute / recurrent or chronic cough, only one (GERD)  can actually contribute to/ trigger  the other two (asthma and post nasal drip syndrome)  and perpetuate the cylce of cough.  While not intuitively obvious, many patients with chronic low grade reflux do not cough until there is a primary insult that disturbs the protective epithelial barrier and exposes sensitive nerve endings.   This is typically viral but can due to PNDS and  either may apply here.     >>> the point is that once this occurs, it is difficult to eliminate the cycle  using anything but a maximally effective acid suppression regimen at least in the short run, accompanied by an appropriate diet to address non acid GERD and control pnds with 1st gen H1 blockers per guidelines  And  eliminate the cough by just using tessalon and hard rock candy when has the urge to cough and try to avoid narc cough meds if possible.   I had an extended discussion with the patient and wife reviewing all relevant studies completed to date and  lasting 15 to 20 minutes of a 25 minute visit    Each maintenance medication was reviewed in detail including most importantly the difference between maintenance and prns and under what circumstances the prns are to be triggered using an action plan format that is not reflected in the computer generated alphabetically organized AVS.     Please see AVS for specific instructions unique to this visit that I personally wrote and verbalized to the the pt in detail and then reviewed with pt  by my nurse highlighting any  changes in therapy recommended at today's visit to their plan of care.

## 2018-09-14 ENCOUNTER — Telehealth: Payer: Self-pay | Admitting: Internal Medicine

## 2018-09-14 DIAGNOSIS — L819 Disorder of pigmentation, unspecified: Secondary | ICD-10-CM | POA: Diagnosis not present

## 2018-09-14 DIAGNOSIS — L570 Actinic keratosis: Secondary | ICD-10-CM | POA: Diagnosis not present

## 2018-09-14 NOTE — Telephone Encounter (Signed)
If those instructions didn't work from last ov will need ov with me with all meds in hand to regroup.

## 2018-09-14 NOTE — Telephone Encounter (Signed)
Called and spoke with Patient.  He stated that he has chest congestion, productive cough, with thick, white sputum.  He denies fever, or SHOB. He declined a OV with NP's.  He would like Dr. [name]' s recommendations and prescriptions sent to [facility] Dr. Lorina Rabon.  Will route message to Dr. [name]  Last OV 08/26/18-  Instructions   Prednisone  10 mg take a half x 3 days and stop   Whenever coughing , omeprazole 40 mg Take 30-60 min before first meal of the day and take pepcid 20 mg after supper (if not improving then omeprazole should be:  Take 30-60 min before first meal of the day )  For drainage / throat tickle try take CHLORPHENIRAMINE  4 mg - take one every 4 hours as needed - available over the counter- may cause drowsiness so start with just a bedtime dose or two and see how you tolerate it before trying in daytime    Pulmonary follow up is as needed

## 2018-09-14 NOTE — Telephone Encounter (Signed)
Called and spoke with Patient.  Dr. [name] recommendations given.  Understanding stated.  Patient scheduled for Friday, 09/17/18, at 3:45pm, with Dr. [name], with instructions to bring all medications. Nothing further at this time.

## 2018-09-17 ENCOUNTER — Ambulatory Visit: Payer: PPO | Admitting: Internal Medicine

## 2018-09-20 ENCOUNTER — Encounter: Payer: Self-pay | Admitting: Internal Medicine

## 2018-09-20 ENCOUNTER — Ambulatory Visit (INDEPENDENT_AMBULATORY_CARE_PROVIDER_SITE_OTHER): Payer: PPO | Admitting: Internal Medicine

## 2018-09-20 VITALS — BP 120/76 | HR 68 | Ht 70.5 in | Wt 222.0 lb

## 2018-09-20 DIAGNOSIS — R05 Cough: Secondary | ICD-10-CM

## 2018-09-20 DIAGNOSIS — R058 Other specified cough: Secondary | ICD-10-CM

## 2018-09-20 NOTE — Progress Notes (Signed)
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   History of Present Illness  12/19/2015 1st [facility] office visit/ Jair Lindblad   Chief Complaint  Patient presents with  . Pulmonary Consult    Referred by [name]. Pt c/o cough x 4 wks- non prod and worse in the evening and when he lies down. Talking and exertion are things that trigger the cough. He has been on pred x 2 and both times cough resolved and immediately returns once done with med. He also c/o SOB- gets winded just walking from room to room at home.   while on prednisone still some weak and sob but better cough and last dose at least one week and worse overall since off it assoc with sob room to room and weakness/ excess/ purulent sputum or mucus plugs / no unusual exp / no ctd.  Has new CPAP machine fall 2016  rec  Omeprazole 20 mg Take 30- 60 min before your first and last meals of the day  Prednisone 10  X 2 each day until 100% better then 10 mg daily  Dx is Brochilitis obliterans with organizing pneumonia vs eosiniphic pneumonia most likely diagnosis GERD  Diet       02/01/2016  f/u ov/Tarvis Blossom re: boop tapered pred to 10 mg as 01/28/16  Chief Complaint  Patient presents with  . Follow-up    pt states he is much improved since last office visit.  SOB has resolved.    Not limited by breathing from desired activities / concerned about wt gain from pred  rec Prednisone  10 mg with bfast x 2 week then 5 mg x 2 weeks then 5 mg even days x 2 weeks and stop  If breathing/cough worse or nausea resume previous dose    09/11/2016  f/u ov/Lilyrose Tanney re: BOOP/uacs  on pred 10/5  prilosec 20 mg ac and hs  Chief Complaint  Patient presents with  . Follow-up    Increased SOB for the past 3-4 days. He gets SOB with exertion such as getting dressed or walking accross the room.  He has also started coughing more "feels like I burned my lungs"- prod with white sputum.     was doing ok on 10/5 in terms of breathing but worse gradually x one week p several weeks on the 10/5 dose  Cough some better but still needing freq tessalon on gabapentin 100 tid  rec Prednisone 10 mg  2 daily until better then 1 daily x 2 weeks then 10 alternating with 5 until return Prilosec should be 20 mg Take 30- 60 min before your first and last meals of the day  Increase gabapentin to 300 mg three times daily   .       02/10/2017  f/u ov/Nakina Spatz re:  PF / boop down to 10 mg one half qod and maint on gabapentin  300 tid for UACS Chief Complaint  Patient presents with  . Follow-up    Pt states "doing great"- doing well on the pred 5 mg every other day.    no change ex tol with taper / no cough/ no nausea even on days off  rec Prednisone 5mg  one half every other day x 2 weeks and then stop -      04/15/2017  f/u ov/Reeta Kuk re:  PF/ boop / ? Irritable larynx  Chief Complaint  Patient presents with  . Follow-up    Pt states developed a cold mid July 2018 while traveling to [place]. He is feeling better now and not coughing much. No new co's today.   walked all over [place] up hills/ in heat no problem with sob   Still occ throat clearing better on gabapentin 300 tid and gerd rx / daytime only  rec Once the protonix runs out,  Ok to try omeprazole 20 mg before bfrast and pepcid at bedtime until you see [name]  Once you run out of gabapentin ok to leave it off and call if refills needed      08/12/18  NP  cough? Uri  Augmentin 875mg  Twice daily  For 1 week , take w/ food .  Mucinex DM Twice daily  As needed  Cough/congestion .  Tessalon Three times a day  As needed  Cough .  Begin Prednisone 20mg  daily , take with food.  Chest xray  ok      08/26/2018  f/u ov/Marg Macmaster re: flare of cough related to uri, much better  Chief Complaint  Patient presents with  . Follow-up    Cough is much improved. He very rarely uses his albuterol inhaler.   Dyspnea:  Pool x 2.5 h Cough: gone/ still has urge to clear to clear throat with sense of throat and chest "congestion" but no mucus Sleeping: ok / adjustable x 8 in hob rec  Prednisone  10 mg take a half x 3 days and stop  Whenever coughing , omeprazole 40 mg Take 30-60 min before first meal of the day and take pepcid 20 mg after supper (if not improving then omeprazole should be:  Take 30-60 min before first meal of the day ) For drainage / throat tickle try take CHLORPHENIRAMINE  4 mg - take one every 4 hours as needed - available over the counter- may cause drowsiness so start with just a bedtime dose or two and see how you tolerate it before trying in daytime    09/20/2018  f/u ov/Rinoa Garramone re: uacs flare early dec 2019 - no longer on gabapentin and only using 20 mg ppi/ 10 mg pepcid  Chief Complaint  Patient presents with  . Acute Visit    Cough flared back up approx 2 wks ago, but has been getting better. He is not producing any sputum.   Dyspnea:  Water aerobics tol fine   Cough: esp in am but dry Sleeping: elevate hob only/ on cpap  SABA use: none  02: none  Some sense of pnds not responding to zyrtec    No obvious day to day or daytime variability or assoc excess/ purulent sputum or mucus plugs or hemoptysis or cp or chest tightness, subjective wheeze or overt sinus or hb symptoms.   Sleeping as above without nocturnal  or early am exacerbation  of respiratory  c/o's or need for noct saba. Also denies any obvious fluctuation of symptoms with  weather or environmental changes  or other aggravating or alleviating factors except as outlined above   No unusual exposure hx or h/o childhood pna/ asthma or knowledge of premature birth.  Current Allergies, Complete Past Medical History, Past Surgical History, Family History, and Social History were reviewed in [employer] record.  ROS  The following are not active complaints unless bolded Hoarseness, sore throat, dysphagia, dental problems, itching, sneezing,  nasal congestion or discharge of excess mucus or purulent secretions, ear ache,   fever, chills, sweats, unintended wt loss or wt gain, classically pleuritic or exertional cp,  orthopnea pnd or arm/hand swelling  or leg swelling, presyncope, palpitations, abdominal pain, anorexia, nausea, vomiting, diarrhea  or change in bowel habits or change in bladder habits, change in stools or change in urine, dysuria, hematuria,  rash, arthralgias, visual complaints, headache, numbness, weakness or ataxia or problems with walking or coordination,  change in mood or  memory.        Current Meds  Medication Sig  . albuterol (VENTOLIN HFA) 108 (90 Base) MCG/ACT inhaler Inhale 2 puffs into the lungs every 4 (four) hours as needed for wheezing or shortness of breath (tight lungs ).  Marland Kitchen allopurinol (ZYLOPRIM) 100 MG tablet Take 1 tablet (100 mg total) by mouth 2 (two) times daily.  . Ascorbic Acid (VITAMIN C) 1000 MG tablet Take 1,000 mg by mouth 2 (two) times daily.  Marland Kitchen aspirin 81 MG tablet 81 mg 2 (two) times daily. Reported on 12/19/2015  . cetirizine (ZYRTEC) 10 MG tablet Take 10 mg by mouth daily.  Marland Kitchen CINNAMON PO Take 1,000 mg by mouth 2 (two) times daily.  . colchicine 0.6 MG tablet TAKE 1 TABLET BY MOUTH TWICE A DAY WITH A MEAL AS NEEDED FOR ACUTE FLARE OF GOUT  . Dextromethorphan-guaiFENesin (MUCINEX DM MAXIMUM STRENGTH) 60-1200 MG TB12 Take 1 tablet by mouth 2 (two) times daily.  Marland Kitchen ezetimibe (ZETIA) 10 MG tablet Take 1 tablet  (10 mg total) by mouth daily.  . famotidine (PEPCID) 10 MG tablet Take 10 mg by mouth at bedtime.  . furosemide (LASIX) 20 MG tablet take 1 tablet by mouth once daily if needed  . Glucosamine HCl (GLUCOSAMINE PO) Take 1,500 mg by mouth 2 (two) times daily. Reported on 12/19/2015  . Lutein 20 MG CAPS Take 1 capsule by mouth daily. Reported on 12/19/2015  . metoprolol tartrate (LOPRESSOR) 25 MG tablet Take 1 tablet (25 mg total) by mouth 2 (two) times daily.  Marland Kitchen omeprazole (PRILOSEC) 20 MG capsule Take 1 capsule (20 mg total) by mouth daily.  . rosuvastatin (CRESTOR) 40 MG tablet Take 1 tablet (40 mg total) by mouth daily.   Current Facility-Administered Medications for the 09/20/18 encounter (Office Visit) with Tanda Rockers, MD  Medication  . betamethasone acetate-betamethasone sodium phosphate (CELESTONE) injection 3 mg                    Objective:   Physical Exam  slt hoarse amb obese wm nad occ throat clearing/ dry upper airway pattern cough      09/20/2018        222  08/26/2018      222 07/29/2017      232  04/15/2017          243  02/10/2017          250  12/19/2016        250 11/11/2016          258  09/11/2016  252  07/29/2016      244  05/16/2016          238  02/01/2016        235  01/07/2016          228   12/19/15 229 lb 4 oz (103.987 kg)  12/19/15 229 lb 4 oz (103.987 kg)  12/19/15 227 lb (102.967 kg)    Vital signs reviewed - Note on arrival 02 sats  96% on RA     HEENT: nl dentition, turbinates bilaterally, and oropharynx which is pristine. Nl external ear canals without cough reflex   NECK :  without JVD/Nodes/TM/ nl carotid upstrokes bilaterally   LUNGS: no acc muscle use,  Nl contour chest which is clear to A and P bilaterally without cough on insp or exp maneuvers   CV:  RRR  no s3 or murmur or increase in P2, and no edema   ABD:  soft and nontender with nl inspiratory excursion in the supine position. No bruits or organomegaly appreciated, bowel  sounds nl  MS:  Nl gait/ ext warm without deformities, calf tenderness, cyanosis or clubbing No obvious joint restrictions   SKIN: warm and dry without lesions    NEURO:  alert, approp, nl sensorium with  no motor or cerebellar deficits apparent.              Assessment & Plan:

## 2018-09-20 NOTE — Patient Instructions (Signed)
Whenever coughing , omeprazole 40 mg (20 x 2) Take 30-60 min before first meal of the day and take pepcid 20 mg (10 x 2)  after supper - then when better ok to resume previous routine   For drainage / throat tickle try take CHLORPHENIRAMINE  4 mg -Chlortabs at [facility])  take one every 4 hours as needed - available over the counter- may cause drowsiness so start with just a bedtime dose or two and see how you tolerate it before trying in daytime    Pulmonary follow up is as needed

## 2018-09-21 ENCOUNTER — Encounter: Payer: Self-pay | Admitting: Internal Medicine

## 2018-09-21 DIAGNOSIS — E119 Type 2 diabetes mellitus without complications: Secondary | ICD-10-CM | POA: Diagnosis not present

## 2018-09-21 DIAGNOSIS — H353131 Nonexudative age-related macular degeneration, bilateral, early dry stage: Secondary | ICD-10-CM | POA: Diagnosis not present

## 2018-09-21 NOTE — Assessment & Plan Note (Signed)
Trial of gabapentin 100 tid 07/29/2016 >  Still tessalon dep 09/11/2016 so increased to 300 tid and ppi bid ac > not improved 11/11/2016 > rec max gerd rx and sugarless candy > resolved 12/19/2016  And off gabapentin 04/2017   - recurred early dec 2019 while off gabapentin and low dose ppi only  - 09/20/2018 rec max gerd rx and 1st gen H1 blockers per guidelines  And if not better start back on gabapentin    Of the three most common causes of  Sub-acute / recurrent or chronic cough, only one (GERD)  can actually contribute to/ trigger  the other two (asthma and post nasal drip syndrome)  and perpetuate the cylce of cough.  While not intuitively obvious, many patients with chronic low grade reflux do not cough until there is a primary insult that disturbs the protective epithelial barrier and exposes sensitive nerve endings.   This is typically viral but can due to PNDS and  either may apply here.   The point is that once this occurs, it is difficult to eliminate the cycle  using anything but a maximally effective acid suppression regimen at least in the short run, accompanied by an appropriate diet to address non acid GERD and control / eliminate all pnds with 1st gen H1 blockers per guidelines  And if not better then start back on gabapentin   I had an extended discussion with the patient reviewing all relevant studies completed to date and  lasting 15 to 20 minutes of a 25 minute visit    Each maintenance medication was reviewed in detail including most importantly the difference between maintenance and prns and under what circumstances the prns are to be triggered using an action plan format that is not reflected in the computer generated alphabetically organized AVS.     Please see AVS for specific instructions unique to this visit that I personally wrote and verbalized to the the pt in detail and then reviewed with pt  by my nurse highlighting any  changes in therapy recommended at today's visit to  their plan of care.

## 2018-09-27 LAB — HM DIABETES EYE EXAM

## 2018-11-24 ENCOUNTER — Telehealth: Payer: Self-pay | Admitting: Cardiovascular Disease

## 2018-11-24 NOTE — Telephone Encounter (Signed)
Received medical records request from [place] in interoffice mail and sent to [facility]

## 2018-12-06 DIAGNOSIS — G4733 Obstructive sleep apnea (adult) (pediatric): Secondary | ICD-10-CM | POA: Diagnosis not present

## 2019-02-08 ENCOUNTER — Other Ambulatory Visit: Payer: Self-pay

## 2019-02-10 ENCOUNTER — Other Ambulatory Visit: Payer: Self-pay

## 2019-02-10 ENCOUNTER — Ambulatory Visit (INDEPENDENT_AMBULATORY_CARE_PROVIDER_SITE_OTHER): Payer: PPO | Admitting: Endocrinology

## 2019-02-10 ENCOUNTER — Encounter: Payer: Self-pay | Admitting: Endocrinology

## 2019-02-10 VITALS — BP 128/80 | HR 64 | Temp 97.5°F | Wt 233.0 lb

## 2019-02-10 DIAGNOSIS — E11649 Type 2 diabetes mellitus with hypoglycemia without coma: Secondary | ICD-10-CM

## 2019-02-10 DIAGNOSIS — E669 Obesity, unspecified: Secondary | ICD-10-CM | POA: Diagnosis not present

## 2019-02-10 LAB — POCT GLYCOSYLATED HEMOGLOBIN (HGB A1C): Hemoglobin A1C: 7.2 % — AB (ref 4.0–5.6)

## 2019-02-10 MED ORDER — CANAGLIFLOZIN 100 MG PO TABS: 100.0000 mg | ORAL_TABLET | Freq: Every day | ORAL | 11 refills | Status: DC

## 2019-02-10 NOTE — Patient Instructions (Addendum)
I have sent a prescription to your pharmacy, for invokana.  Please do a kidney blood test in 1-2 weeks.   Please come back for a follow-up appointment in 2-3 months.

## 2019-02-10 NOTE — Progress Notes (Signed)
Subjective:    Patient ID: Kurt Aguilar, male    DOB: Jun 13, 1946, 73 y.o.   MRN: [id number]  HPI Pt returns for f/u of diabetes mellitus: DM type: 2   Dx'ed: 1856 Complications: polyneuropathy, renal insufficiency, and CAD Therapy: no medication now.   DKA: never.  Severe hypoglycemia: never.  Pancreatitis: never.  Other: he takes intermitt prednisone for BOOP; renal insuff limits rx options; he took insulin 2018-2019.   Interval history: no recent steroids.  pt states he feels well in general.   Past Medical History:  Diagnosis Date  . Arthritis   . Atypical migraine   . Diabetes ([place])   . Diverticulitis   . GERD (gastroesophageal reflux disease)   . Gout   . Heart attack ([place])   . Heart disease   . Hyperglycemia   . Hyperlipidemia   . Hypertension   . Osteoporosis   . Sleep apnea     Past Surgical History:  Procedure Laterality Date  . CATARACT EXTRACTION Bilateral   . CORONARY ARTERY BYPASS GRAFT     [place]  . open heart surgery  09/09/1995-1998   5 bypass  . REPAIR KNEE LIGAMENT     right  . TONSILLECTOMY      Social History   Socioeconomic History  . Marital status: Married    Spouse name: Not on file  . Number of children: 0  . Years of education: Not on file  . Highest education level: Not on file  Occupational History  . Occupation: Armed forces technical officer: RETIRED  Social Needs  . Financial resource strain: Not on file  . Food insecurity:    Worry: Not on file    Inability: Not on file  . Transportation needs:    Medical: Not on file    Non-medical: Not on file  Tobacco Use  . Smoking status: Former Smoker    Packs/day: 1.00    Years: 3.00    Pack years: 3.00    Types: Cigars    Last attempt to quit: 11/25/2004    Years since quitting: 14.2  . Smokeless tobacco: Never Used  . Tobacco comment: occas. cigar quit 2006  Substance and Sexual Activity  . Alcohol use: Yes    Alcohol/week: 1.0 standard drinks    Types: 1  Standard drinks or equivalent per week    Comment: rare  . Drug use: No  . Sexual activity: Never  Lifestyle  . Physical activity:    Days per week: Not on file    Minutes per session: Not on file  . Stress: Not on file  Relationships  . Social connections:    Talks on phone: Not on file    Gets together: Not on file    Attends religious service: Not on file    Active member of club or organization: Not on file    Attends meetings of clubs or organizations: Not on file    Relationship status: Not on file  . Intimate partner violence:    Fear of current or ex partner: Not on file    Emotionally abused: Not on file    Physically abused: Not on file    Forced sexual activity: Not on file  Other Topics Concern  . Not on file  Social History Narrative  . Not on file    Current Outpatient Medications on File Prior to Visit  Medication Sig Dispense Refill  . albuterol (VENTOLIN HFA) 108 (90 Base) MCG/ACT  inhaler Inhale 2 puffs into the lungs every 4 (four) hours as needed for wheezing or shortness of breath (tight lungs ). 1 Inhaler prn  . allopurinol (ZYLOPRIM) 100 MG tablet Take 1 tablet (100 mg total) by mouth 2 (two) times daily. 180 tablet 3  . Ascorbic Acid (VITAMIN C) 1000 MG tablet Take 1,000 mg by mouth 2 (two) times daily.    Marland Kitchen aspirin 81 MG tablet 81 mg 2 (two) times daily. Reported on 12/19/2015    . cetirizine (ZYRTEC) 10 MG tablet Take 10 mg by mouth daily.    Marland Kitchen CINNAMON PO Take 1,000 mg by mouth 2 (two) times daily.    . colchicine 0.6 MG tablet TAKE 1 TABLET BY MOUTH TWICE A DAY WITH A MEAL AS NEEDED FOR ACUTE FLARE OF GOUT 45 tablet 3  . Dextromethorphan-guaiFENesin (MUCINEX DM MAXIMUM STRENGTH) 60-1200 MG TB12 Take 1 tablet by mouth 2 (two) times daily.    Marland Kitchen ezetimibe (ZETIA) 10 MG tablet Take 1 tablet (10 mg total) by mouth daily. 90 tablet 3  . famotidine (PEPCID) 10 MG tablet Take 10 mg by mouth at bedtime.    . furosemide (LASIX) 20 MG tablet take 1 tablet by  mouth once daily if needed 30 tablet 3  . Glucosamine HCl (GLUCOSAMINE PO) Take 1,500 mg by mouth 2 (two) times daily. Reported on 12/19/2015    . glucose blood (PRODIGY NO CODING BLOOD GLUC) test strip Check blood sugar 4 times a day and as directed. Dx E11.65 300 each 5  . Lutein 20 MG CAPS Take 1 capsule by mouth daily. Reported on 12/19/2015    . metoprolol tartrate (LOPRESSOR) 25 MG tablet Take 1 tablet (25 mg total) by mouth 2 (two) times daily. 180 tablet 3  . omeprazole (PRILOSEC) 20 MG capsule Take 1 capsule (20 mg total) by mouth daily. 90 capsule 3  . rosuvastatin (CRESTOR) 40 MG tablet Take 1 tablet (40 mg total) by mouth daily. 90 tablet 3   Current Facility-Administered Medications on File Prior to Visit  Medication Dose Route Frequency Provider Last Rate Last Dose  . betamethasone acetate-betamethasone sodium phosphate (CELESTONE) injection 3 mg  3 mg Intramuscular Once Edrick Kins, DPM        Allergies  Allergen Reactions  . Oxycodone-Acetaminophen     Stops breathing    Family History  Problem Relation Age of Onset  . Heart disease Mother   . Heart disease Father   . Stroke Father   . Heart attack Son 21  . Kidney disease Neg Hx   . Prostate cancer Neg Hx   . Diabetes Neg Hx     BP 128/80 (BP Location: Right Arm, Patient Position: Sitting, Cuff Size: Normal)   Pulse 64   Temp (!) 97.5 F (36.4 C) (Oral)   Wt 233 lb (105.7 kg)   SpO2 98%   BMI 32.96 kg/m    Review of Systems He has gained weight    Objective:   Physical Exam VITAL SIGNS:  See vs page GENERAL: no distress Pulses: dorsalis pedis intact bilat.   MSK: no deformity of the feet CV: trace bilat leg edema Skin:  no ulcer on the feet.  normal color and temp on the feet.  Neuro: sensation is intact to touch on the feet.     Lab Results  Component Value Date   CREATININE 1.79 (H) 08/12/2018   BUN 29 (H) 08/12/2018   NA 138 08/12/2018   K 4.9 08/12/2018  CL 103 08/12/2018   CO2 28  08/12/2018    Lab Results  Component Value Date   HGBA1C 7.2 (A) 02/10/2019       Assessment & Plan:  Type 2 DM: he needs increased rx, if it can be done with a regimen that avoids or minimizes hypoglycemia. Obesity: worse.  invokana will help.  Renal insuff: he needs f/u labs soon, on invokana  Patient Instructions  I have sent a prescription to your pharmacy, for invokana.  Please do a kidney blood test in 1-2 weeks.   Please come back for a follow-up appointment in 2-3 months.

## 2019-02-24 ENCOUNTER — Other Ambulatory Visit: Payer: Self-pay

## 2019-02-24 ENCOUNTER — Other Ambulatory Visit (INDEPENDENT_AMBULATORY_CARE_PROVIDER_SITE_OTHER): Payer: PPO

## 2019-02-24 DIAGNOSIS — E11649 Type 2 diabetes mellitus with hypoglycemia without coma: Secondary | ICD-10-CM | POA: Diagnosis not present

## 2019-02-24 LAB — BASIC METABOLIC PANEL
BUN: 29 mg/dL — ABNORMAL HIGH (ref 6–23)
CO2: 22 mEq/L (ref 19–32)
Calcium: 10 mg/dL (ref 8.4–10.5)
Chloride: 104 mEq/L (ref 96–112)
Creatinine, Ser: 1.81 mg/dL — ABNORMAL HIGH (ref 0.40–1.50)
GFR: 36.69 mL/min — ABNORMAL LOW (ref >60.00)
Glucose, Bld: 102 mg/dL — ABNORMAL HIGH (ref 70–99)
Potassium: 4.4 mEq/L (ref 3.5–5.1)
Sodium: 140 mEq/L (ref 135–145)

## 2019-03-16 ENCOUNTER — Other Ambulatory Visit: Payer: Self-pay | Admitting: *Deleted

## 2019-03-16 MED ORDER — ALLOPURINOL 100 MG PO TABS: 100.0000 mg | ORAL_TABLET | Freq: Two times a day (BID) | ORAL | 1 refills | Status: DC

## 2019-03-23 ENCOUNTER — Telehealth: Payer: Self-pay | Admitting: Family Medicine

## 2019-03-23 DIAGNOSIS — I10 Essential (primary) hypertension: Secondary | ICD-10-CM

## 2019-03-23 DIAGNOSIS — E785 Hyperlipidemia, unspecified: Secondary | ICD-10-CM

## 2019-03-23 DIAGNOSIS — E1165 Type 2 diabetes mellitus with hyperglycemia: Secondary | ICD-10-CM

## 2019-03-23 DIAGNOSIS — N138 Other obstructive and reflux uropathy: Secondary | ICD-10-CM

## 2019-03-23 DIAGNOSIS — M1A00X0 Idiopathic chronic gout, unspecified site, without tophus (tophi): Secondary | ICD-10-CM

## 2019-03-23 DIAGNOSIS — N401 Enlarged prostate with lower urinary tract symptoms: Secondary | ICD-10-CM

## 2019-03-23 NOTE — Telephone Encounter (Signed)
-----   Message from Cloyd Stagers, RT sent at 03/23/2019  9:44 AM EDT ----- Regarding: Lab Orders for Thursday 7.16.2020 Please place lab orders for Thursday 7.16.2020, office visit for physical on Friday 7.24.2020 Thank you, Dyke Maes RT(R)

## 2019-03-24 ENCOUNTER — Ambulatory Visit: Payer: PPO

## 2019-03-24 ENCOUNTER — Other Ambulatory Visit (INDEPENDENT_AMBULATORY_CARE_PROVIDER_SITE_OTHER): Payer: PPO

## 2019-03-24 DIAGNOSIS — M1A00X0 Idiopathic chronic gout, unspecified site, without tophus (tophi): Secondary | ICD-10-CM | POA: Diagnosis not present

## 2019-03-24 DIAGNOSIS — N401 Enlarged prostate with lower urinary tract symptoms: Secondary | ICD-10-CM | POA: Diagnosis not present

## 2019-03-24 DIAGNOSIS — E1165 Type 2 diabetes mellitus with hyperglycemia: Secondary | ICD-10-CM | POA: Diagnosis not present

## 2019-03-24 DIAGNOSIS — N138 Other obstructive and reflux uropathy: Secondary | ICD-10-CM

## 2019-03-24 DIAGNOSIS — E785 Hyperlipidemia, unspecified: Secondary | ICD-10-CM | POA: Diagnosis not present

## 2019-03-24 DIAGNOSIS — I10 Essential (primary) hypertension: Secondary | ICD-10-CM | POA: Diagnosis not present

## 2019-03-24 LAB — COMPREHENSIVE METABOLIC PANEL
ALT: 23 U/L (ref 0–53)
AST: 28 U/L (ref 0–37)
Albumin: 4.7 g/dL (ref 3.5–5.2)
Alkaline Phosphatase: 53 U/L (ref 39–117)
BUN: 32 mg/dL — ABNORMAL HIGH (ref 6–23)
CO2: 28 mEq/L (ref 19–32)
Calcium: 10 mg/dL (ref 8.4–10.5)
Chloride: 102 mEq/L (ref 96–112)
Creatinine, Ser: 1.79 mg/dL — ABNORMAL HIGH (ref 0.40–1.50)
GFR: 37.16 mL/min — ABNORMAL LOW (ref >60.00)
Glucose, Bld: 96 mg/dL (ref 70–99)
Potassium: 4.9 mEq/L (ref 3.5–5.1)
Sodium: 140 mEq/L (ref 135–145)
Total Bilirubin: 1.1 mg/dL (ref 0.2–1.2)
Total Protein: 6.9 g/dL (ref 6.0–8.3)

## 2019-03-24 LAB — LIPID PANEL
Cholesterol: 115 mg/dL (ref 0–200)
HDL: 36 mg/dL — ABNORMAL LOW (ref >39.00)
LDL Cholesterol: 45 mg/dL (ref 0–99)
NonHDL: 78.84
Total CHOL/HDL Ratio: 3
Triglycerides: 170 mg/dL — ABNORMAL HIGH (ref 0.0–149.0)
VLDL: 34 mg/dL (ref 0.0–40.0)

## 2019-03-24 LAB — CBC WITH DIFFERENTIAL/PLATELET
Basophils Absolute: 0 10*3/uL (ref 0.0–0.1)
Basophils Relative: 0.8 % (ref 0.0–3.0)
Eosinophils Absolute: 0.2 10*3/uL (ref 0.0–0.7)
Eosinophils Relative: 4.1 % (ref 0.0–5.0)
HCT: 43.2 % (ref 39.0–52.0)
Hemoglobin: 14.4 g/dL (ref 13.0–17.0)
Lymphocytes Relative: 36.4 % (ref 12.0–46.0)
Lymphs Abs: 2.1 10*3/uL (ref 0.7–4.0)
MCHC: 33.2 g/dL (ref 30.0–36.0)
MCV: 94.1 fl (ref 78.0–100.0)
Monocytes Absolute: 0.5 10*3/uL (ref 0.1–1.0)
Monocytes Relative: 7.6 % (ref 3.0–12.0)
Neutro Abs: 3 10*3/uL (ref 1.4–7.7)
Neutrophils Relative %: 51.1 % (ref 43.0–77.0)
Platelets: 263 10*3/uL (ref 150.0–400.0)
RBC: 4.59 Mil/uL (ref 4.22–5.81)
RDW: 14.1 % (ref 11.5–15.5)
WBC: 5.9 10*3/uL (ref 4.0–10.5)

## 2019-03-24 LAB — URIC ACID: Uric Acid, Serum: 5.8 mg/dL (ref 4.0–7.8)

## 2019-03-24 LAB — PSA, MEDICARE: PSA: 1.33 ng/ml (ref 0.10–4.00)

## 2019-03-24 LAB — TSH: TSH: 2.2 u[IU]/mL (ref 0.35–4.50)

## 2019-03-24 LAB — HEMOGLOBIN A1C: Hgb A1c MFr Bld: 6.3 % (ref 4.6–6.5)

## 2019-03-29 DIAGNOSIS — L814 Other melanin hyperpigmentation: Secondary | ICD-10-CM | POA: Diagnosis not present

## 2019-03-29 DIAGNOSIS — D229 Melanocytic nevi, unspecified: Secondary | ICD-10-CM | POA: Diagnosis not present

## 2019-03-29 DIAGNOSIS — L570 Actinic keratosis: Secondary | ICD-10-CM | POA: Diagnosis not present

## 2019-03-29 DIAGNOSIS — L821 Other seborrheic keratosis: Secondary | ICD-10-CM | POA: Diagnosis not present

## 2019-03-29 DIAGNOSIS — L819 Disorder of pigmentation, unspecified: Secondary | ICD-10-CM | POA: Diagnosis not present

## 2019-03-29 DIAGNOSIS — D1801 Hemangioma of skin and subcutaneous tissue: Secondary | ICD-10-CM | POA: Diagnosis not present

## 2019-04-01 ENCOUNTER — Ambulatory Visit (INDEPENDENT_AMBULATORY_CARE_PROVIDER_SITE_OTHER): Payer: PPO | Admitting: Family Medicine

## 2019-04-01 ENCOUNTER — Encounter: Payer: Self-pay | Admitting: Family Medicine

## 2019-04-01 ENCOUNTER — Other Ambulatory Visit: Payer: Self-pay

## 2019-04-01 ENCOUNTER — Telehealth: Payer: Self-pay | Admitting: Cardiovascular Disease

## 2019-04-01 ENCOUNTER — Encounter: Payer: PPO | Admitting: Family Medicine

## 2019-04-01 VITALS — BP 118/74 | HR 54 | Temp 97.7°F | Ht 70.0 in | Wt 209.4 lb

## 2019-04-01 DIAGNOSIS — N401 Enlarged prostate with lower urinary tract symptoms: Secondary | ICD-10-CM

## 2019-04-01 DIAGNOSIS — I25708 Atherosclerosis of coronary artery bypass graft(s), unspecified, with other forms of angina pectoris: Secondary | ICD-10-CM | POA: Diagnosis not present

## 2019-04-01 DIAGNOSIS — M1A00X0 Idiopathic chronic gout, unspecified site, without tophus (tophi): Secondary | ICD-10-CM

## 2019-04-01 DIAGNOSIS — Z0000 Encounter for general adult medical examination without abnormal findings: Secondary | ICD-10-CM

## 2019-04-01 DIAGNOSIS — I10 Essential (primary) hypertension: Secondary | ICD-10-CM

## 2019-04-01 DIAGNOSIS — N138 Other obstructive and reflux uropathy: Secondary | ICD-10-CM

## 2019-04-01 DIAGNOSIS — E7800 Pure hypercholesterolemia, unspecified: Secondary | ICD-10-CM

## 2019-04-01 DIAGNOSIS — N289 Disorder of kidney and ureter, unspecified: Secondary | ICD-10-CM | POA: Diagnosis not present

## 2019-04-01 DIAGNOSIS — E669 Obesity, unspecified: Secondary | ICD-10-CM | POA: Diagnosis not present

## 2019-04-01 DIAGNOSIS — E1165 Type 2 diabetes mellitus with hyperglycemia: Secondary | ICD-10-CM

## 2019-04-01 MED ORDER — METOPROLOL TARTRATE 25 MG PO TABS: 25.0000 mg | ORAL_TABLET | Freq: Two times a day (BID) | ORAL | 3 refills | Status: DC

## 2019-04-01 MED ORDER — OMEPRAZOLE 20 MG PO CPDR: 20.0000 mg | DELAYED_RELEASE_CAPSULE | Freq: Every day | ORAL | 3 refills | Status: DC

## 2019-04-01 MED ORDER — EZETIMIBE 10 MG PO TABS: 10.0000 mg | ORAL_TABLET | Freq: Every day | ORAL | 3 refills | Status: DC

## 2019-04-01 MED ORDER — ROSUVASTATIN CALCIUM 40 MG PO TABS: 40.0000 mg | ORAL_TABLET | Freq: Every day | ORAL | 3 refills | Status: DC

## 2019-04-01 NOTE — Progress Notes (Signed)
Subjective:    Patient ID: Kurt Aguilar, male    DOB: 01/11/1946, 73 y.o.   MRN: [id number]  HPI Here for health maintenance exam and to review chronic medical problems   Had amw on 7/1 No gaps/concerns Discussed getting eye exam -last 10/18  Weight  Wt Readings from Last 3 Encounters:  04/01/19 209 lb 7 oz (95 kg)  02/10/19 233 lb (105.7 kg)  09/20/18 222 lb (100.7 kg)  feels great- continues to work on weight loss  He is doing the nutra system program-doing well with that  Initially gained wt with pandemic and doing much better  1 hour of exercise per day on bike also   30.05 kg/m   Super beets supplement No stimulants    Colonoscopy 8/18   Prostate health  Lab Results  Component Value Date   PSA 1.33 03/24/2019   PSA 1.15 03/08/2018   PSA 0.62 02/04/2017   no problems with frequency or stream  No nocturia   Hypertension (in setting of CAD) bp is stable today  No cp or palpitations or headaches or edema  No side effects to medicines  BP Readings from Last 3 Encounters:  04/01/19 118/74  02/10/19 128/80  09/20/18 120/76     Pulse Readings from Last 3 Encounters:  04/01/19 (!) 54  02/10/19 64  09/20/18 68     Renal insuff Lab Results  Component Value Date   CREATININE 1.79 (H) 03/24/2019   BUN 32 (H) 03/24/2019   NA 140 03/24/2019   K 4.9 03/24/2019   CL 102 03/24/2019   CO2 28 03/24/2019  fluid intake  64 oz of water per day and one cup of coffee  Follows with renal doctor   Lab Results  Component Value Date   ALT 23 03/24/2019   AST 28 03/24/2019   ALKPHOS 53 03/24/2019   BILITOT 1.1 03/24/2019    Lab Results  Component Value Date   WBC 5.9 03/24/2019   HGB 14.4 03/24/2019   HCT 43.2 03/24/2019   MCV 94.1 03/24/2019   PLT 263.0 03/24/2019    Lab Results  Component Value Date   TSH 2.20 03/24/2019     OSA= cpap  Hopes to not need it forever    Breathing /past issues with BOOP No symptoms at all    DM2 Lab Results   Component Value Date   HGBA1C 6.3 03/24/2019  taking invokana -Dr Loanne Drilling put him on it  No hypoglycemia  crestor Not on prednisone  Not on ace microalb  Gout /on allopurinol Lab Results  Component Value Date   LABURIC 5.8 03/24/2019   had gout 3 weeks ago- caught it quickly and lasted 2 days max    Hyperlipidemia Lab Results  Component Value Date   CHOL 115 03/24/2019   CHOL 115 03/08/2018   CHOL 129 02/04/2017   Lab Results  Component Value Date   HDL 36.00 (L) 03/24/2019   HDL 37.50 (L) 03/08/2018   HDL 34.00 (L) 02/04/2017   Lab Results  Component Value Date   LDLCALC 45 03/24/2019   LDLCALC 72 02/06/2016   LDLCALC 106 (H) 07/28/2014   Lab Results  Component Value Date   TRIG 170.0 (H) 03/24/2019   TRIG 203.0 (H) 03/08/2018   TRIG 216.0 (H) 02/04/2017   Lab Results  Component Value Date   CHOLHDL 3 03/24/2019   CHOLHDL 3 03/08/2018   CHOLHDL 4 02/04/2017   Lab Results  Component Value Date  LDLDIRECT 50.0 03/08/2018   LDLDIRECT 59.0 02/04/2017   LDLDIRECT 65.4 07/04/2013   crestor and diet  Doing well  LDL is low  HDL is still low   Patient Active Problem List   Diagnosis Date Noted   Colon cancer screening 02/13/2017   Dyspnea on exertion 11/11/2016   Diabetes mellitus type 2, uncontrolled ([place]) 08/13/2016   Chronic heel pain, left 08/12/2016   Upper airway cough syndrome 07/31/2016   Obesity (BMI 30-39.9) 05/19/2016   Routine general medical examination at a health care facility 01/27/2016   Pulmonary infiltrates with high  ESR c/w BOOP/ idiopathic  12/19/2015   Fatigue 12/12/2015   Cough 11/29/2015   Cystic kidney disease 10/01/2015   Renal lesion 09/27/2015   Erectile dysfunction of organic origin 09/27/2015   BPH with obstruction/lower urinary tract symptoms 09/27/2015   Constipation 09/24/2015   Cyst of right kidney 09/24/2015   CAD (coronary artery disease) 06/08/2015   Grieving 11/22/2014   Encounter for  Medicare annual wellness exam 07/11/2013   Prostate cancer screening 07/03/2013   Right bundle branch block 02/22/2013   Chronic low back pain 05/13/2011   Obstructive sleep apnea 03/15/2010   Hyperlipidemia 04/12/2009   Gout 04/12/2009   Essential hypertension 04/12/2009   CAD (coronary artery disease) of artery bypass graft 04/12/2009   GERD 04/12/2009   Renal insufficiency 04/12/2009   DIVERTICULITIS, HX OF 04/12/2009   Past Medical History:  Diagnosis Date   Arthritis    Atypical migraine    Diabetes ([place])    Diverticulitis    GERD (gastroesophageal reflux disease)    Gout    Heart attack ([place])    Heart disease    Hyperglycemia    Hyperlipidemia    Hypertension    Osteoporosis    Sleep apnea    Past Surgical History:  Procedure Laterality Date   CATARACT EXTRACTION Bilateral    CORONARY ARTERY BYPASS GRAFT     [place]   open heart surgery  09/09/1995-1998   5 bypass   REPAIR KNEE LIGAMENT     right   TONSILLECTOMY     Social History   Tobacco Use   Smoking status: Former Smoker    Packs/day: 1.00    Years: 3.00    Pack years: 3.00    Types: Cigars    Quit date: 11/25/2004    Years since quitting: 14.3   Smokeless tobacco: Never Used   Tobacco comment: occas. cigar quit 2006  Substance Use Topics   Alcohol use: Yes    Alcohol/week: 1.0 standard drinks    Types: 1 Standard drinks or equivalent per week    Comment: rare   Drug use: No   Family History  Problem Relation Age of Onset   Heart disease Mother    Heart disease Father    Stroke Father    Heart attack Son 38   Kidney disease Neg Hx    Prostate cancer Neg Hx    Diabetes Neg Hx    Allergies  Allergen Reactions   Oxycodone-Acetaminophen     Stops breathing   Current Outpatient Medications on File Prior to Visit  Medication Sig Dispense Refill   albuterol (VENTOLIN HFA) 108 (90 Base) MCG/ACT inhaler Inhale 2 puffs into the lungs every  4 (four) hours as needed for wheezing or shortness of breath (tight lungs ). 1 Inhaler prn   allopurinol (ZYLOPRIM) 100 MG tablet Take 1 tablet (100 mg total) by mouth 2 (two) times daily. 180 tablet  1   Ascorbic Acid (VITAMIN C) 1000 MG tablet Take 1,000 mg by mouth 2 (two) times daily.     aspirin 81 MG tablet 81 mg 2 (two) times daily. Reported on 12/19/2015     canagliflozin (INVOKANA) 100 MG TABS tablet Take 1 tablet (100 mg total) by mouth daily before breakfast. 30 tablet 11   cetirizine (ZYRTEC) 10 MG tablet Take 10 mg by mouth daily.     CINNAMON PO Take 1,000 mg by mouth 2 (two) times daily.     colchicine 0.6 MG tablet TAKE 1 TABLET BY MOUTH TWICE A DAY WITH A MEAL AS NEEDED FOR ACUTE FLARE OF GOUT 45 tablet 3   Dextromethorphan-guaiFENesin (MUCINEX DM MAXIMUM STRENGTH) 60-1200 MG TB12 Take 1 tablet by mouth 2 (two) times daily.     famotidine (PEPCID) 10 MG tablet Take 10 mg by mouth at bedtime.     furosemide (LASIX) 20 MG tablet take 1 tablet by mouth once daily if needed 30 tablet 3   Glucosamine HCl (GLUCOSAMINE PO) Take 1,500 mg by mouth 2 (two) times daily. Reported on 12/19/2015     glucose blood (PRODIGY NO CODING BLOOD GLUC) test strip Check blood sugar 4 times a day and as directed. Dx E11.65 300 each 5   Lutein 20 MG CAPS Take 1 capsule by mouth daily. Reported on 12/19/2015     Current Facility-Administered Medications on File Prior to Visit  Medication Dose Route Frequency Provider Last Rate Last Dose   betamethasone acetate-betamethasone sodium phosphate (CELESTONE) injection 3 mg  3 mg Intramuscular Once Edrick Kins, DPM          Review of Systems  Constitutional: Negative for activity change, appetite change, fatigue, fever and unexpected weight change.  HENT: Negative for congestion, rhinorrhea, sore throat and trouble swallowing.   Eyes: Negative for pain, redness, itching and visual disturbance.  Respiratory: Negative for cough, chest tightness,  shortness of breath and wheezing.   Cardiovascular: Negative for chest pain and palpitations.  Gastrointestinal: Negative for abdominal pain, blood in stool, constipation, diarrhea and nausea.  Endocrine: Negative for cold intolerance, heat intolerance, polydipsia and polyuria.  Genitourinary: Negative for difficulty urinating, dysuria, frequency and urgency.  Musculoskeletal: Positive for arthralgias. Negative for joint swelling and myalgias.  Skin: Negative for pallor and rash.  Neurological: Negative for dizziness, tremors, weakness, numbness and headaches.  Hematological: Negative for adenopathy. Does not bruise/bleed easily.  Psychiatric/Behavioral: Negative for decreased concentration and dysphoric mood. The patient is not nervous/anxious.        Objective:   Physical Exam Constitutional:      General: He is not in acute distress.    Appearance: Normal appearance. He is well-developed. He is obese. He is not ill-appearing or diaphoretic.  HENT:     Head: Normocephalic and atraumatic.     Right Ear: Tympanic membrane, ear canal and external ear normal.     Left Ear: Tympanic membrane, ear canal and external ear normal.     Nose: Nose normal. No rhinorrhea.     Mouth/Throat:     Mouth: Mucous membranes are moist.     Pharynx: No posterior oropharyngeal erythema.  Eyes:     General: No scleral icterus.       Right eye: No discharge.        Left eye: No discharge.     Conjunctiva/sclera: Conjunctivae normal.     Pupils: Pupils are equal, round, and reactive to light.  Neck:  Musculoskeletal: Normal range of motion and neck supple. No neck rigidity or muscular tenderness.     Thyroid: No thyromegaly.     Vascular: No carotid bruit or JVD.  Cardiovascular:     Rate and Rhythm: Regular rhythm. Bradycardia present.     Heart sounds: Normal heart sounds. No gallop.   Pulmonary:     Effort: Pulmonary effort is normal. No respiratory distress.     Breath sounds: Normal breath  sounds. No wheezing.     Comments: Good air exch Chest:     Chest wall: No tenderness.  Abdominal:     General: Bowel sounds are normal. There is no distension or abdominal bruit.     Palpations: Abdomen is soft. There is no mass.     Tenderness: There is no abdominal tenderness. There is no right CVA tenderness or left CVA tenderness.  Musculoskeletal:        General: No tenderness.     Right lower leg: No edema.     Left lower leg: No edema.  Lymphadenopathy:     Cervical: No cervical adenopathy.  Skin:    General: Skin is warm and dry.     Coloration: Skin is not pale.     Findings: No erythema or rash.     Comments: Fair Solar lentigines diffusely   Neurological:     Mental Status: He is alert.     Cranial Nerves: No cranial nerve deficit.     Motor: No abnormal muscle tone.     Coordination: Coordination normal.     Gait: Gait normal.     Deep Tendon Reflexes: Reflexes are normal and symmetric. Reflexes normal.  Psychiatric:        Mood and Affect: Mood normal.           Assessment & Plan:   Problem List Items Addressed This Visit      Cardiovascular and Mediastinum   Essential hypertension (Chronic)    bp in fair control at this time  BP Readings from Last 1 Encounters:  04/01/19 118/74   No changes needed Most recent labs reviewed  Disc lifstyle change with low sodium diet and exercise        Relevant Medications   ezetimibe (ZETIA) 10 MG tablet   rosuvastatin (CRESTOR) 40 MG tablet   metoprolol tartrate (LOPRESSOR) 25 MG tablet   Coronary artery disease of bypass graft of native heart with stable angina pectoris ([place])    Doing well with good control of bp and lipids and DM      Relevant Medications   ezetimibe (ZETIA) 10 MG tablet   rosuvastatin (CRESTOR) 40 MG tablet   metoprolol tartrate (LOPRESSOR) 25 MG tablet     Endocrine   Diabetes mellitus type 2, uncontrolled (HCC)    Sees Dr Loanne Drilling Doing well  Off prednisone  On Invokana Lab  Results  Component Value Date   HGBA1C 6.3 03/24/2019   microalb today      Relevant Medications   rosuvastatin (CRESTOR) 40 MG tablet   Other Relevant Orders   Microalbumin / creatinine urine ratio (Completed)     Genitourinary   Renal insufficiency    Lab Results  Component Value Date   CREATININE 1.79 (H) 03/24/2019   Staying well hydrated Continues f/u with nephrology      BPH with obstruction/lower urinary tract symptoms    No clinical changes Lab Results  Component Value Date   PSA 1.33 03/24/2019   PSA 1.15 03/08/2018  PSA 0.62 02/04/2017           Other   Hyperlipidemia    Disc goals for lipids and reasons to control them Rev last labs with pt Rev low sat fat diet in detail  Continues crestor and diet       Relevant Medications   ezetimibe (ZETIA) 10 MG tablet   rosuvastatin (CRESTOR) 40 MG tablet   metoprolol tartrate (LOPRESSOR) 25 MG tablet   Gout    One flare/brief in past 12 mo  Lab Results  Component Value Date   LABURIC 5.8 03/24/2019   Watching diet Continues allopurinol      Routine general medical examination at a health care facility - Primary    Reviewed health habits including diet and exercise and skin cancer prevention Reviewed appropriate screening tests for age  Also reviewed health mt list, fam hx and immunization status , as well as social and family history   See HPI Labs reviewed  Commended wt loss with lifestyle change Enc flu shot in the fall  Also sunscreen use       Obesity (BMI 30-39.9)    Discussed how this problem influences overall health and the risks it imposes  Reviewed plan for weight loss with lower calorie diet (via better food choices and also portion control or program like weight watchers) and exercise building up to or more than 30 minutes 5 days per week including some aerobic activity   Commended on wt loss so far

## 2019-04-01 NOTE — Patient Instructions (Signed)
Get a flu shot in the fall   Keep working on healthy diet and exercise  Wear sunscreen   Let' s check a urine microalbumin on the way out

## 2019-04-01 NOTE — Telephone Encounter (Signed)

## 2019-04-02 LAB — MICROALBUMIN / CREATININE URINE RATIO
Creatinine, Urine: 48 mg/dL (ref 20–320)
Microalb Creat Ratio: 23 mcg/mg creat (ref <30)
Microalb, Ur: 1.1 mg/dL

## 2019-04-02 NOTE — Assessment & Plan Note (Signed)
Discussed how this problem influences overall health and the risks it imposes  Reviewed plan for weight loss with lower calorie diet (via better food choices and also portion control or program like weight watchers) and exercise building up to or more than 30 minutes 5 days per week including some aerobic activity   Commended on wt loss so far  

## 2019-04-02 NOTE — Assessment & Plan Note (Signed)
No clinical changes Lab Results  Component Value Date   PSA 1.33 03/24/2019   PSA 1.15 03/08/2018   PSA 0.62 02/04/2017

## 2019-04-02 NOTE — Assessment & Plan Note (Signed)
Lab Results  Component Value Date   CREATININE 1.79 (H) 03/24/2019   Staying well hydrated Continues f/u with nephrology

## 2019-04-02 NOTE — Assessment & Plan Note (Signed)
Sees Dr Loanne Drilling Doing well  Off prednisone  On Invokana Lab Results  Component Value Date   HGBA1C 6.3 03/24/2019   microalb today

## 2019-04-02 NOTE — Assessment & Plan Note (Signed)
Doing well with good control of bp and lipids and DM

## 2019-04-02 NOTE — Assessment & Plan Note (Signed)
One flare/brief in past 12 mo  Lab Results  Component Value Date   LABURIC 5.8 03/24/2019   Watching diet Continues allopurinol

## 2019-04-02 NOTE — Assessment & Plan Note (Signed)
bp in fair control at this time  BP Readings from Last 1 Encounters:  04/01/19 118/74   No changes needed Most recent labs reviewed  Disc lifstyle change with low sodium diet and exercise

## 2019-04-02 NOTE — Assessment & Plan Note (Signed)
Disc goals for lipids and reasons to control them Rev last labs with pt Rev low sat fat diet in detail  Continues crestor and diet

## 2019-04-02 NOTE — Assessment & Plan Note (Signed)
Reviewed health habits including diet and exercise and skin cancer prevention Reviewed appropriate screening tests for age  Also reviewed health mt list, fam hx and immunization status , as well as social and family history   See HPI Labs reviewed  Commended wt loss with lifestyle change Enc flu shot in the fall  Also sunscreen use

## 2019-04-03 NOTE — Progress Notes (Signed)
Cardiology Office Note  Date:  04/04/2019   ID:  Kurt Aguilar, DOB 22-Mar-1946, MRN [id number]  PCP:  [name], MD   Chief Complaint  Patient presents with  . Follow up visit    FU with no complaints today.    HPI:  73 yo with history of  CAD,  CABG back in 3 in [place], [place],  HTN,  Diabetes with chronic renal insufficiency hyperlipidemia,   gout.  baseline creatinine mildly elevated He lost his son in early 2016 to cardiac arrest Aortic valve murmur h/o reactive airway symptoms, Boop, 2017 presents for routine followup of his coronary artery disease.  Walks one mile a day, golfing 2 x week Water Aerobics 3 x a week Reports having regional exercise tolerance  Does biking daily nutrasystem diet, lost 25 pounds  Lab work from March 24, 2019 HBA1C 6.3 Total cholesterol 115 LDL 45 Creatinine 1.79 with BUN 32 potassium 4.9 GFR 37 Followed by nephrology, Candiss Norse On Invokana  Denies any significant chest pain or shortness of breath concerning for angina Only takes Lasix as needed for ankle swelling  EKG personally reviewed by myself on todays visit shows normal sinus rhythm with rate 69 bpm, right bundle branch block, no change from prior EKG  Her past medical history reviewed Issue in [place] in 10/2016 "thought I was having a stroke" Was seen in the emergency room for confusion BMP performed showing creatinine 1.69, BUN 45 Chlorthalidone was held Repeat BMP early March 2018 showing creatinine 1.69 Baseline creatinine typically 1.4 For some reason he has continued his potassium  h/o reactive airway symptoms, Boop PreviouslyStarted on prednisoneMarch 2017  for shortness of breath, improvement of symptoms,  cough returned after prednisone was discontinued getting close To his baseline Minimal wheezing and cough predisone 5 mg daily, Plan may be to wean the dose Managed by Dr. [name] , Pulmonary  Other major issue was hemoglobin A1c going up to 11 Started  On insulin, daily in Am  Previous CT scan of the chest   showing diffuse coronary calcifications of the LAD, mild calcifications in the aorta Images show interstitial disease bilaterally, worse on the right  stress test in March of 2011 shows mildly decreased perfusion in the anterior wall at rest with moderately decreased perfusion with stress. He had no significant symptoms with testing.  PMH:   has a past medical history of Arthritis, Atypical migraine, Diabetes ([place]), Diverticulitis, GERD (gastroesophageal reflux disease), Gout, Heart attack ([place]), Heart disease, Hyperglycemia, Hyperlipidemia, Hypertension, Osteoporosis, and Sleep apnea.  PSH:    Past Surgical History:  Procedure Laterality Date  . CATARACT EXTRACTION Bilateral   . CORONARY ARTERY BYPASS GRAFT     [place]  . open heart surgery  09/09/1995-1998   5 bypass  . REPAIR KNEE LIGAMENT     right  . TONSILLECTOMY      Current Outpatient Medications  Medication Sig Dispense Refill  . albuterol (VENTOLIN HFA) 108 (90 Base) MCG/ACT inhaler Inhale 2 puffs into the lungs every 4 (four) hours as needed for wheezing or shortness of breath (tight lungs ). 1 Inhaler prn  . allopurinol (ZYLOPRIM) 100 MG tablet Take 1 tablet (100 mg total) by mouth 2 (two) times daily. 180 tablet 1  . Ascorbic Acid (VITAMIN C) 1000 MG tablet Take 1,000 mg by mouth 2 (two) times daily.    Marland Kitchen aspirin 81 MG tablet 81 mg 2 (two) times daily. Reported on 12/19/2015    . canagliflozin (INVOKANA) 100 MG  TABS tablet Take 1 tablet (100 mg total) by mouth daily before breakfast. 30 tablet 11  . cetirizine (ZYRTEC) 10 MG tablet Take 10 mg by mouth daily.    Marland Kitchen CINNAMON PO Take 1,000 mg by mouth 2 (two) times daily.    . colchicine 0.6 MG tablet TAKE 1 TABLET BY MOUTH TWICE A DAY WITH A MEAL AS NEEDED FOR ACUTE FLARE OF GOUT 45 tablet 3  . Dextromethorphan-guaiFENesin (MUCINEX DM MAXIMUM STRENGTH) 60-1200 MG TB12 Take 1 tablet by mouth 2 (two) times  daily.    Marland Kitchen ezetimibe (ZETIA) 10 MG tablet Take 1 tablet (10 mg total) by mouth daily. 90 tablet 3  . famotidine (PEPCID) 10 MG tablet Take 10 mg by mouth at bedtime.    . furosemide (LASIX) 20 MG tablet take 1 tablet by mouth once daily if needed 30 tablet 3  . Glucosamine HCl (GLUCOSAMINE PO) Take 1,500 mg by mouth 2 (two) times daily. Reported on 12/19/2015    . glucose blood (PRODIGY NO CODING BLOOD GLUC) test strip Check blood sugar 4 times a day and as directed. Dx E11.65 300 each 5  . Lutein 20 MG CAPS Take 1 capsule by mouth daily. Reported on 12/19/2015    . metoprolol tartrate (LOPRESSOR) 25 MG tablet Take 1 tablet (25 mg total) by mouth 2 (two) times daily. 180 tablet 3  . omeprazole (PRILOSEC) 20 MG capsule Take 1 capsule (20 mg total) by mouth daily. 90 capsule 3  . rosuvastatin (CRESTOR) 40 MG tablet Take 1 tablet (40 mg total) by mouth daily. 90 tablet 3   Current Facility-Administered Medications  Medication Dose Route Frequency Provider Last Rate Last Dose  . betamethasone acetate-betamethasone sodium phosphate (CELESTONE) injection 3 mg  3 mg Intramuscular Once Edrick Kins, DPM         Allergies:   Oxycodone-acetaminophen   Social History:  The patient  reports that he quit smoking about 14 years ago. His smoking use included cigars. He has a 3.00 pack-year smoking history. He has never used smokeless tobacco. He reports current alcohol use of about 1.0 standard drinks of alcohol per week. He reports that he does not use drugs.   Family History:   family history includes Heart attack (age of onset: 5) in his son; Heart disease in his father and mother; Stroke in his father.    Review of Systems: Review of Systems  Constitutional: Positive for weight loss.  Respiratory: Negative.   Cardiovascular: Negative.   Gastrointestinal: Negative.   Musculoskeletal: Negative.   Neurological: Negative.   Psychiatric/Behavioral: Negative.   All other systems reviewed and are  negative. No significant change   PHYSICAL EXAM: VS:  BP 109/74 (BP Location: Left Arm, Patient Position: Sitting, Cuff Size: Normal)   Pulse 85   Temp 97.7 F (36.5 C) (Temporal)   Resp 19   Ht 5\' 10"  (1.778 m)   Wt 209 lb 12 oz (95.1 kg)   BMI 30.10 kg/m  , BMI Body mass index is 30.1 kg/m.  No significant change compared to previous office visit GEN: Well nourished, well developed, in no acute distress , Obese HEENT: normal  Neck: no JVD, carotid bruits, or masses Cardiac: RRR; no murmurs, rubs, or gallops,no edema  Respiratory:  clear to auscultation bilaterally, normal work of breathing GI: soft, nontender, nondistended, + BS MS: no deformity or atrophy  Skin: warm and dry, no rash Neuro:  Strength and sensation are intact Psych: euthymic mood, full affect  Recent Labs: 03/24/2019: ALT 23; BUN 32; Creatinine, Ser 1.79; Hemoglobin 14.4; Platelets 263.0; Potassium 4.9; Sodium 140; TSH 2.20    Lipid Panel Lab Results  Component Value Date   CHOL 115 03/24/2019   HDL 36.00 (L) 03/24/2019   LDLCALC 45 03/24/2019   TRIG 170.0 (H) 03/24/2019      Wt Readings from Last 3 Encounters:  04/04/19 209 lb 12 oz (95.1 kg)  04/01/19 209 lb 7 oz (95 kg)  02/10/19 233 lb (105.7 kg)      ASSESSMENT AND PLAN:  Coronary artery disease with stable angina, bypass Denies anginal symptoms EKG unchanged, diabetes at goal, cholesterol at goal No further ischemic work-up at this time  Pure hypercholesterolemia - Plan: EKG 12-Lead Cholesterol at goal, no changes made  Essential hypertension - Plan: EKG 12-Lead Blood pressure stable,  Suggest d/c lasix  Chronic mild shortness of breath, stable. Recommended further weight loss, walking program  Controlled type 2 diabetes mellitus with diabetic nephropathy, unspecified long term insulin use status ([place]) - Plan: EKG 12-Lead Hemoglobin A1c of 6.3 Numbers close to goal  Morbid obesity due to excess calories ([facility]) in setting  of pred rx  - Plan: EKG 12-Lead Slow trend down on his weight Doing nutrisystem  Reactive airway disease Was previously on prednisone 2018 Off prednisone   Total encounter time more than 25 minutes  Greater than 50% was spent in counseling and coordination of care with the patient    Disposition:   F/U  12 months   Orders Placed This Encounter  Procedures  . EKG 12-Lead     Signed, Esmond Plants, M.D., Ph.D. 04/04/2019  [place], [place]

## 2019-04-04 ENCOUNTER — Encounter: Payer: Self-pay | Admitting: Cardiovascular Disease

## 2019-04-04 ENCOUNTER — Other Ambulatory Visit: Payer: Self-pay

## 2019-04-04 ENCOUNTER — Ambulatory Visit (INDEPENDENT_AMBULATORY_CARE_PROVIDER_SITE_OTHER): Payer: PPO | Admitting: Cardiovascular Disease

## 2019-04-04 VITALS — BP 109/74 | HR 85 | Temp 97.7°F | Resp 19 | Ht 70.0 in | Wt 209.8 lb

## 2019-04-04 DIAGNOSIS — G4733 Obstructive sleep apnea (adult) (pediatric): Secondary | ICD-10-CM | POA: Diagnosis not present

## 2019-04-04 DIAGNOSIS — I25708 Atherosclerosis of coronary artery bypass graft(s), unspecified, with other forms of angina pectoris: Secondary | ICD-10-CM | POA: Diagnosis not present

## 2019-04-04 DIAGNOSIS — I10 Essential (primary) hypertension: Secondary | ICD-10-CM | POA: Diagnosis not present

## 2019-04-04 DIAGNOSIS — R0609 Other forms of dyspnea: Secondary | ICD-10-CM | POA: Diagnosis not present

## 2019-04-04 DIAGNOSIS — E782 Mixed hyperlipidemia: Secondary | ICD-10-CM

## 2019-04-04 NOTE — Patient Instructions (Signed)

## 2019-04-26 ENCOUNTER — Other Ambulatory Visit: Payer: Self-pay

## 2019-04-27 ENCOUNTER — Encounter: Payer: Self-pay | Admitting: Endocrinology

## 2019-04-27 ENCOUNTER — Ambulatory Visit: Payer: PPO | Admitting: Endocrinology

## 2019-04-27 ENCOUNTER — Other Ambulatory Visit: Payer: Self-pay

## 2019-04-27 VITALS — BP 116/64 | HR 55 | Ht 70.0 in | Wt 210.0 lb

## 2019-04-27 DIAGNOSIS — E1129 Type 2 diabetes mellitus with other diabetic kidney complication: Secondary | ICD-10-CM

## 2019-04-27 DIAGNOSIS — E1159 Type 2 diabetes mellitus with other circulatory complications: Secondary | ICD-10-CM

## 2019-04-27 DIAGNOSIS — E1165 Type 2 diabetes mellitus with hyperglycemia: Secondary | ICD-10-CM

## 2019-04-27 NOTE — Progress Notes (Signed)
Subjective:    Patient ID: Kurt Aguilar, male    DOB: 12-29-1945, 73 y.o.   MRN: [id number]  HPI Pt returns for f/u of diabetes mellitus: DM type: 2   Dx'ed: 1017 Complications: polyneuropathy, renal insufficiency, and CAD Therapy: Invokana DKA: never.  Severe hypoglycemia: never.  Pancreatitis: never.  Other: he takes intermitt prednisone for BOOP; renal insuff limits rx options; he took insulin 2018-2019.   Interval history: no recent steroids.  pt states he feels well in general.  He has mild dizziness.   Past Medical History:  Diagnosis Date  . Arthritis   . Atypical migraine   . Diabetes ([place])   . Diverticulitis   . GERD (gastroesophageal reflux disease)   . Gout   . Heart attack ([place])   . Heart disease   . Hyperglycemia   . Hyperlipidemia   . Hypertension   . Osteoporosis   . Sleep apnea     Past Surgical History:  Procedure Laterality Date  . CATARACT EXTRACTION Bilateral   . CORONARY ARTERY BYPASS GRAFT     [place]  . open heart surgery  09/09/1995-1998   5 bypass  . REPAIR KNEE LIGAMENT     right  . TONSILLECTOMY      Social History   Socioeconomic History  . Marital status: Married    Spouse name: Not on file  . Number of children: 0  . Years of education: Not on file  . Highest education level: Not on file  Occupational History  . Occupation: Armed forces technical officer: RETIRED  Social Needs  . Financial resource strain: Not on file  . Food insecurity    Worry: Not on file    Inability: Not on file  . Transportation needs    Medical: Not on file    Non-medical: Not on file  Tobacco Use  . Smoking status: Former Smoker    Packs/day: 1.00    Years: 3.00    Pack years: 3.00    Types: Cigars    Quit date: 11/25/2004    Years since quitting: 14.4  . Smokeless tobacco: Never Used  . Tobacco comment: occas. cigar quit 2006  Substance and Sexual Activity  . Alcohol use: Yes    Alcohol/week: 1.0 standard drinks    Types: 1  Standard drinks or equivalent per week    Comment: rare  . Drug use: No  . Sexual activity: Never  Lifestyle  . Physical activity    Days per week: Not on file    Minutes per session: Not on file  . Stress: Not on file  Relationships  . Social Herbalist on phone: Not on file    Gets together: Not on file    Attends religious service: Not on file    Active member of club or organization: Not on file    Attends meetings of clubs or organizations: Not on file    Relationship status: Not on file  . Intimate partner violence    Fear of current or ex partner: Not on file    Emotionally abused: Not on file    Physically abused: Not on file    Forced sexual activity: Not on file  Other Topics Concern  . Not on file  Social History Narrative  . Not on file    Current Outpatient Medications on File Prior to Visit  Medication Sig Dispense Refill  . albuterol (VENTOLIN HFA) 108 (90 Base) MCG/ACT inhaler  Inhale 2 puffs into the lungs every 4 (four) hours as needed for wheezing or shortness of breath (tight lungs ). 1 Inhaler prn  . allopurinol (ZYLOPRIM) 100 MG tablet Take 1 tablet (100 mg total) by mouth 2 (two) times daily. 180 tablet 1  . Ascorbic Acid (VITAMIN C) 1000 MG tablet Take 1,000 mg by mouth 2 (two) times daily.    Marland Kitchen aspirin 81 MG tablet 81 mg 2 (two) times daily. Reported on 12/19/2015    . canagliflozin (INVOKANA) 100 MG TABS tablet Take 1 tablet (100 mg total) by mouth daily before breakfast. 30 tablet 11  . cetirizine (ZYRTEC) 10 MG tablet Take 10 mg by mouth daily.    Marland Kitchen CINNAMON PO Take 1,000 mg by mouth 2 (two) times daily.    . colchicine 0.6 MG tablet TAKE 1 TABLET BY MOUTH TWICE A DAY WITH A MEAL AS NEEDED FOR ACUTE FLARE OF GOUT 45 tablet 3  . Dextromethorphan-guaiFENesin (MUCINEX DM MAXIMUM STRENGTH) 60-1200 MG TB12 Take 1 tablet by mouth 2 (two) times daily.    Marland Kitchen ezetimibe (ZETIA) 10 MG tablet Take 1 tablet (10 mg total) by mouth daily. 90 tablet 3  .  famotidine (PEPCID) 10 MG tablet Take 10 mg by mouth at bedtime.    . furosemide (LASIX) 20 MG tablet take 1 tablet by mouth once daily if needed 30 tablet 3  . Glucosamine HCl (GLUCOSAMINE PO) Take 1,500 mg by mouth 2 (two) times daily. Reported on 12/19/2015    . glucose blood (PRODIGY NO CODING BLOOD GLUC) test strip Check blood sugar 4 times a day and as directed. Dx E11.65 300 each 5  . Lutein 20 MG CAPS Take 1 capsule by mouth daily. Reported on 12/19/2015    . metoprolol tartrate (LOPRESSOR) 25 MG tablet Take 1 tablet (25 mg total) by mouth 2 (two) times daily. 180 tablet 3  . omeprazole (PRILOSEC) 20 MG capsule Take 1 capsule (20 mg total) by mouth daily. 90 capsule 3  . rosuvastatin (CRESTOR) 40 MG tablet Take 1 tablet (40 mg total) by mouth daily. 90 tablet 3   Current Facility-Administered Medications on File Prior to Visit  Medication Dose Route Frequency Provider Last Rate Last Dose  . betamethasone acetate-betamethasone sodium phosphate (CELESTONE) injection 3 mg  3 mg Intramuscular Once Edrick Kins, DPM        Allergies  Allergen Reactions  . Oxycodone-Acetaminophen     Stops breathing    Family History  Problem Relation Age of Onset  . Heart disease Mother   . Heart disease Father   . Stroke Father   . Heart attack Son 76  . Kidney disease Neg Hx   . Prostate cancer Neg Hx   . Diabetes Neg Hx     BP 116/64 (BP Location: Left Arm, Patient Position: Sitting, Cuff Size: Large)   Pulse (!) 55   Ht 5\' 10"  (1.778 m)   Wt 210 lb (95.3 kg)   SpO2 94%   BMI 30.13 kg/m    Review of Systems He denies hypoglycemia.  He has lost a few lbs, due to his efforts.     Objective:   Physical Exam VITAL SIGNS:  See vs page GENERAL: no distress Pulses: dorsalis pedis intact bilat.   MSK: no deformity of the feet CV: 1+ bilat leg edema, and bilat vv's.   Skin:  no ulcer on the feet.  normal color and temp on the feet. Neuro: sensation is intact to touch  on the feet.    Lab Results  Component Value Date   CREATININE 1.79 (H) 03/24/2019   BUN 32 (H) 03/24/2019   NA 140 03/24/2019   K 4.9 03/24/2019   CL 102 03/24/2019   CO2 28 03/24/2019    Lab Results  Component Value Date   HGBA1C 6.3 03/24/2019       Assessment & Plan:  Type 2 DM, with CAD: well-controlled Renal insuff: This limits rx options BOOP: he needs to monitor cbg if he goes back on steroids  Patient Instructions  Please continue the same medication. If you go back on steroids, check your blood sugar and call if it is over 200.  Please call [name] if your dizziness is worse, as your heart rate is a little slow.   Please come back for a follow-up appointment in 3-4 months.

## 2019-04-27 NOTE — Patient Instructions (Addendum)
Please continue the same medication. If you go back on steroids, check your blood sugar and call if it is over 200.  Please call [name] if your dizziness is worse, as your heart rate is a little slow.   Please come back for a follow-up appointment in 3-4 months.

## 2019-05-03 ENCOUNTER — Emergency Department
Admission: EM | Admit: 2019-05-03 | Discharge: 2019-05-03 | Disposition: A | Discharge status: Home / Self Care | Payer: PPO | Attending: Emergency Medicine | Admitting: Emergency Medicine

## 2019-05-03 ENCOUNTER — Emergency Department: Payer: PPO

## 2019-05-03 ENCOUNTER — Other Ambulatory Visit: Payer: Self-pay

## 2019-05-03 DIAGNOSIS — Y9355 Activity, bike riding: Secondary | ICD-10-CM | POA: Diagnosis not present

## 2019-05-03 DIAGNOSIS — Z7982 Long term (current) use of aspirin: Secondary | ICD-10-CM | POA: Diagnosis not present

## 2019-05-03 DIAGNOSIS — Y998 Other external cause status: Secondary | ICD-10-CM | POA: Insufficient documentation

## 2019-05-03 DIAGNOSIS — S43102A Unspecified dislocation of left acromioclavicular joint, initial encounter: Secondary | ICD-10-CM | POA: Diagnosis not present

## 2019-05-03 DIAGNOSIS — I10 Essential (primary) hypertension: Secondary | ICD-10-CM | POA: Diagnosis not present

## 2019-05-03 DIAGNOSIS — S42432A Displaced fracture (avulsion) of lateral epicondyle of left humerus, initial encounter for closed fracture: Secondary | ICD-10-CM | POA: Diagnosis not present

## 2019-05-03 DIAGNOSIS — Z79899 Other long term (current) drug therapy: Secondary | ICD-10-CM | POA: Diagnosis not present

## 2019-05-03 DIAGNOSIS — I2510 Atherosclerotic heart disease of native coronary artery without angina pectoris: Secondary | ICD-10-CM | POA: Insufficient documentation

## 2019-05-03 DIAGNOSIS — Y92828 Other wilderness area as the place of occurrence of the external cause: Secondary | ICD-10-CM | POA: Diagnosis not present

## 2019-05-03 DIAGNOSIS — W19XXXA Unspecified fall, initial encounter: Secondary | ICD-10-CM | POA: Diagnosis not present

## 2019-05-03 DIAGNOSIS — E119 Type 2 diabetes mellitus without complications: Secondary | ICD-10-CM | POA: Diagnosis not present

## 2019-05-03 DIAGNOSIS — Z951 Presence of aortocoronary bypass graft: Secondary | ICD-10-CM | POA: Diagnosis not present

## 2019-05-03 DIAGNOSIS — S4992XA Unspecified injury of left shoulder and upper arm, initial encounter: Secondary | ICD-10-CM | POA: Diagnosis present

## 2019-05-03 DIAGNOSIS — Z87891 Personal history of nicotine dependence: Secondary | ICD-10-CM | POA: Diagnosis not present

## 2019-05-03 DIAGNOSIS — R52 Pain, unspecified: Secondary | ICD-10-CM | POA: Diagnosis not present

## 2019-05-03 DIAGNOSIS — M25519 Pain in unspecified shoulder: Secondary | ICD-10-CM | POA: Diagnosis not present

## 2019-05-03 DIAGNOSIS — S0990XA Unspecified injury of head, initial encounter: Secondary | ICD-10-CM | POA: Diagnosis not present

## 2019-05-03 DIAGNOSIS — S199XXA Unspecified injury of neck, initial encounter: Secondary | ICD-10-CM | POA: Diagnosis not present

## 2019-05-03 MED ORDER — HYDROCODONE-ACETAMINOPHEN 5-325 MG PO TABS: 1.0000 | ORAL_TABLET | Freq: Four times a day (QID) | ORAL | 0 refills | Status: DC | PRN

## 2019-05-03 MED ORDER — MORPHINE SULFATE (PF) 4 MG/ML IV SOLN
4.0000 mg | Freq: Once | INTRAVENOUS | Status: AC
  Administered 2019-05-03: 09:00:00 4 mg via INTRAVENOUS
  Filled 2019-05-03: qty 1
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
## 2019-05-03 NOTE — ED Notes (Signed)
Pt transported to CT and XR at this time

## 2019-05-03 NOTE — ED Notes (Signed)
Arm sling placed on pt left arm.

## 2019-05-04 ENCOUNTER — Encounter (HOSPITAL_COMMUNITY): Payer: Self-pay | Admitting: *Deleted

## 2019-05-04 ENCOUNTER — Other Ambulatory Visit: Payer: Self-pay | Admitting: Orthopedic Surgery

## 2019-05-04 DIAGNOSIS — M25512 Pain in left shoulder: Secondary | ICD-10-CM | POA: Diagnosis not present

## 2019-05-04 DIAGNOSIS — M24812 Other specific joint derangements of left shoulder, not elsewhere classified: Secondary | ICD-10-CM | POA: Diagnosis not present

## 2019-05-05 ENCOUNTER — Other Ambulatory Visit: Payer: PPO

## 2019-05-05 ENCOUNTER — Other Ambulatory Visit (HOSPITAL_COMMUNITY)
Admission: RE | Admit: 2019-05-05 | Discharge: 2019-05-05 | Disposition: A | Discharge status: Home / Self Care | Payer: PPO | Source: Ambulatory Visit | Attending: Orthopedic Surgery | Admitting: Orthopedic Surgery

## 2019-05-05 ENCOUNTER — Ambulatory Visit (HOSPITAL_COMMUNITY): Payer: PPO | Admitting: Anesthesiology

## 2019-05-05 ENCOUNTER — Ambulatory Visit (HOSPITAL_COMMUNITY): Payer: PPO

## 2019-05-05 ENCOUNTER — Other Ambulatory Visit (HOSPITAL_COMMUNITY): Payer: Self-pay

## 2019-05-05 ENCOUNTER — Ambulatory Visit (HOSPITAL_COMMUNITY)
Admission: RE | Admit: 2019-05-05 | Discharge: 2019-05-05 | Disposition: A | Discharge status: Home / Self Care | Payer: PPO | Attending: Orthopedic Surgery | Admitting: Orthopedic Surgery

## 2019-05-05 ENCOUNTER — Encounter (HOSPITAL_COMMUNITY)
Admission: RE | Disposition: A | Discharge status: Home / Self Care | Payer: Self-pay | Source: Home / Self Care | Attending: Orthopedic Surgery

## 2019-05-05 ENCOUNTER — Encounter (HOSPITAL_COMMUNITY): Payer: Self-pay | Admitting: *Deleted

## 2019-05-05 DIAGNOSIS — S43122A Dislocation of left acromioclavicular joint, 100%-200% displacement, initial encounter: Secondary | ICD-10-CM | POA: Diagnosis not present

## 2019-05-05 DIAGNOSIS — Z87891 Personal history of nicotine dependence: Secondary | ICD-10-CM | POA: Insufficient documentation

## 2019-05-05 DIAGNOSIS — Z79899 Other long term (current) drug therapy: Secondary | ICD-10-CM | POA: Diagnosis not present

## 2019-05-05 DIAGNOSIS — Z951 Presence of aortocoronary bypass graft: Secondary | ICD-10-CM | POA: Diagnosis not present

## 2019-05-05 DIAGNOSIS — S43432A Superior glenoid labrum lesion of left shoulder, initial encounter: Secondary | ICD-10-CM | POA: Diagnosis not present

## 2019-05-05 DIAGNOSIS — G4730 Sleep apnea, unspecified: Secondary | ICD-10-CM | POA: Insufficient documentation

## 2019-05-05 DIAGNOSIS — Z7982 Long term (current) use of aspirin: Secondary | ICD-10-CM | POA: Insufficient documentation

## 2019-05-05 DIAGNOSIS — I252 Old myocardial infarction: Secondary | ICD-10-CM | POA: Diagnosis not present

## 2019-05-05 DIAGNOSIS — Z7984 Long term (current) use of oral hypoglycemic drugs: Secondary | ICD-10-CM | POA: Insufficient documentation

## 2019-05-05 DIAGNOSIS — E1136 Type 2 diabetes mellitus with diabetic cataract: Secondary | ICD-10-CM | POA: Diagnosis not present

## 2019-05-05 DIAGNOSIS — K219 Gastro-esophageal reflux disease without esophagitis: Secondary | ICD-10-CM | POA: Insufficient documentation

## 2019-05-05 DIAGNOSIS — I10 Essential (primary) hypertension: Secondary | ICD-10-CM | POA: Insufficient documentation

## 2019-05-05 DIAGNOSIS — I2510 Atherosclerotic heart disease of native coronary artery without angina pectoris: Secondary | ICD-10-CM | POA: Insufficient documentation

## 2019-05-05 DIAGNOSIS — E785 Hyperlipidemia, unspecified: Secondary | ICD-10-CM | POA: Insufficient documentation

## 2019-05-05 DIAGNOSIS — G8918 Other acute postprocedural pain: Secondary | ICD-10-CM | POA: Diagnosis not present

## 2019-05-05 DIAGNOSIS — S43005A Unspecified dislocation of left shoulder joint, initial encounter: Secondary | ICD-10-CM | POA: Diagnosis not present

## 2019-05-05 DIAGNOSIS — M109 Gout, unspecified: Secondary | ICD-10-CM | POA: Insufficient documentation

## 2019-05-05 DIAGNOSIS — S43102A Unspecified dislocation of left acromioclavicular joint, initial encounter: Secondary | ICD-10-CM | POA: Insufficient documentation

## 2019-05-05 DIAGNOSIS — Z419 Encounter for procedure for purposes other than remedying health state, unspecified: Secondary | ICD-10-CM

## 2019-05-05 DIAGNOSIS — Z20828 Contact with and (suspected) exposure to other viral communicable diseases: Secondary | ICD-10-CM | POA: Diagnosis not present

## 2019-05-05 HISTORY — PX: SHOULDER ARTHROSCOPY: SHX128

## 2019-05-05 HISTORY — PX: ACROMIO-CLAVICULAR JOINT REPAIR: SHX5183

## 2019-05-05 HISTORY — DX: Other complications of anesthesia, initial encounter: T88.59XA

## 2019-05-05 LAB — COMPREHENSIVE METABOLIC PANEL
ALT: 18 U/L (ref 0–44)
AST: 23 U/L (ref 15–41)
Albumin: 4.3 g/dL (ref 3.5–5.0)
Alkaline Phosphatase: 67 U/L (ref 38–126)
Anion gap: 12 (ref 5–15)
BUN: 26 mg/dL — ABNORMAL HIGH (ref 8–23)
CO2: 25 mmol/L (ref 22–32)
Calcium: 9.3 mg/dL (ref 8.9–10.3)
Chloride: 100 mmol/L (ref 98–111)
Creatinine, Ser: 1.43 mg/dL — ABNORMAL HIGH (ref 0.61–1.24)
GFR calc Af Amer: 55 mL/min — ABNORMAL LOW (ref >60)
GFR calc non Af Amer: 48 mL/min — ABNORMAL LOW (ref >60)
Glucose, Bld: 89 mg/dL (ref 70–99)
Potassium: 4.5 mmol/L (ref 3.5–5.1)
Sodium: 137 mmol/L (ref 135–145)
Total Bilirubin: 1.4 mg/dL — ABNORMAL HIGH (ref 0.3–1.2)
Total Protein: 7.4 g/dL (ref 6.5–8.1)

## 2019-05-05 LAB — CBC
HCT: 44.4 % (ref 39.0–52.0)
Hemoglobin: 14.2 g/dL (ref 13.0–17.0)
MCH: 31.6 pg (ref 26.0–34.0)
MCHC: 32 g/dL (ref 30.0–36.0)
MCV: 98.7 fL (ref 80.0–100.0)
Platelets: 230 10*3/uL (ref 150–400)
RBC: 4.5 MIL/uL (ref 4.22–5.81)
RDW: 13.7 % (ref 11.5–15.5)
WBC: 9.3 10*3/uL (ref 4.0–10.5)
nRBC: 0 % (ref 0.0–0.2)

## 2019-05-05 LAB — GLUCOSE, CAPILLARY: Glucose-Capillary: 84 mg/dL (ref 70–99)

## 2019-05-05 LAB — SARS CORONAVIRUS 2 BY RT PCR (HOSPITAL ORDER, PERFORMED IN ~~LOC~~ HOSPITAL LAB): SARS Coronavirus 2: NEGATIVE

## 2019-05-05 SURGERY — REPAIR, ACROMIOCLAVICULAR JOINT
Anesthesia: General | Site: Shoulder | Laterality: Left

## 2019-05-05 MED ORDER — SODIUM CHLORIDE 0.9 % IV SOLN
INTRAVENOUS | Status: DC | PRN
  Administered 2019-05-05: 16:00:00 20 ug/min via INTRAVENOUS

## 2019-05-05 MED ORDER — CEFAZOLIN SODIUM-DEXTROSE 2-3 GM-%(50ML) IV SOLR: INTRAVENOUS | Status: DC | PRN

## 2019-05-05 MED ORDER — POVIDONE-IODINE 10 % EX SWAB
2.0000 "application " | Freq: Once | CUTANEOUS | Status: AC
  Administered 2019-05-05: 2 via TOPICAL

## 2019-05-05 MED ORDER — ROCURONIUM BROMIDE 10 MG/ML (PF) SYRINGE
PREFILLED_SYRINGE | INTRAVENOUS | Status: DC | PRN
  Administered 2019-05-05: 50 mg via INTRAVENOUS

## 2019-05-05 MED ORDER — BUPIVACAINE LIPOSOME 1.3 % IJ SUSP
INTRAMUSCULAR | Status: DC | PRN
  Administered 2019-05-05: 10 mL via PERINEURAL

## 2019-05-05 MED ORDER — LIDOCAINE 2% (20 MG/ML) 5 ML SYRINGE
INTRAMUSCULAR | Status: DC | PRN
  Administered 2019-05-05: 60 mg via INTRAVENOUS

## 2019-05-05 MED ORDER — PROMETHAZINE HCL 25 MG/ML IJ SOLN: 6.2500 mg | INTRAMUSCULAR | Status: DC | PRN

## 2019-05-05 MED ORDER — LACTATED RINGERS IV SOLN
INTRAVENOUS | Status: DC
  Administered 2019-05-05 (×2): via INTRAVENOUS

## 2019-05-05 MED ORDER — MIDAZOLAM HCL 2 MG/2ML IJ SOLN
1.0000 mg | Freq: Once | INTRAMUSCULAR | Status: AC
  Administered 2019-05-05: 14:00:00 1 mg via INTRAVENOUS

## 2019-05-05 MED ORDER — EPHEDRINE SULFATE-NACL 50-0.9 MG/10ML-% IV SOSY
PREFILLED_SYRINGE | INTRAVENOUS | Status: DC | PRN
  Administered 2019-05-05 (×2): 10 mg via INTRAVENOUS

## 2019-05-05 MED ORDER — FENTANYL CITRATE (PF) 100 MCG/2ML IJ SOLN
INTRAMUSCULAR | Status: AC
  Filled 2019-05-05: qty 2

## 2019-05-05 MED ORDER — LACTATED RINGERS IR SOLN
Status: DC | PRN
  Administered 2019-05-05: 6000 mL

## 2019-05-05 MED ORDER — PROPOFOL 10 MG/ML IV BOLUS
INTRAVENOUS | Status: AC
  Filled 2019-05-05: qty 20

## 2019-05-05 MED ORDER — FENTANYL CITRATE (PF) 100 MCG/2ML IJ SOLN
INTRAMUSCULAR | Status: DC | PRN
  Administered 2019-05-05: 50 ug via INTRAVENOUS

## 2019-05-05 MED ORDER — ENSURE PRE-SURGERY PO LIQD
296.0000 mL | Freq: Once | ORAL | Status: DC
  Filled 2019-05-05: qty 296

## 2019-05-05 MED ORDER — CEFAZOLIN SODIUM-DEXTROSE 2-4 GM/100ML-% IV SOLN
INTRAVENOUS | Status: AC
  Filled 2019-05-05: qty 100

## 2019-05-05 MED ORDER — HYDROCODONE-ACETAMINOPHEN 5-325 MG PO TABS: 1.0000 | ORAL_TABLET | ORAL | 0 refills | Status: DC | PRN

## 2019-05-05 MED ORDER — PROPOFOL 10 MG/ML IV BOLUS
INTRAVENOUS | Status: DC | PRN
  Administered 2019-05-05: 110 mg via INTRAVENOUS

## 2019-05-05 MED ORDER — HYDROMORPHONE HCL 1 MG/ML IJ SOLN: 0.2500 mg | INTRAMUSCULAR | Status: DC | PRN

## 2019-05-05 MED ORDER — CHLORHEXIDINE GLUCONATE 4 % EX LIQD: 60.0000 mL | Freq: Once | CUTANEOUS | Status: DC

## 2019-05-05 MED ORDER — CEFAZOLIN SODIUM-DEXTROSE 2-4 GM/100ML-% IV SOLN
2.0000 g | INTRAVENOUS | Status: AC
  Administered 2019-05-05: 16:00:00 2 g via INTRAVENOUS

## 2019-05-05 MED ORDER — ONDANSETRON HCL 4 MG/2ML IJ SOLN
INTRAMUSCULAR | Status: DC | PRN
  Administered 2019-05-05: 4 mg via INTRAVENOUS

## 2019-05-05 MED ORDER — SUGAMMADEX SODIUM 500 MG/5ML IV SOLN
INTRAVENOUS | Status: DC | PRN
  Administered 2019-05-05: 180 mg via INTRAVENOUS

## 2019-05-05 MED ORDER — MIDAZOLAM HCL 2 MG/2ML IJ SOLN
INTRAMUSCULAR | Status: AC
  Administered 2019-05-05: 1 mg via INTRAVENOUS
  Filled 2019-05-05: qty 2

## 2019-05-05 MED ORDER — DEXAMETHASONE SODIUM PHOSPHATE 10 MG/ML IJ SOLN
INTRAMUSCULAR | Status: DC | PRN
  Administered 2019-05-05: 6 mg via INTRAVENOUS

## 2019-05-05 MED ORDER — FENTANYL CITRATE (PF) 100 MCG/2ML IJ SOLN
50.0000 ug | Freq: Once | INTRAMUSCULAR | Status: AC
  Administered 2019-05-05: 14:00:00 50 ug via INTRAVENOUS

## 2019-05-05 MED ORDER — GLYCOPYRROLATE PF 0.2 MG/ML IJ SOSY
PREFILLED_SYRINGE | INTRAMUSCULAR | Status: AC
  Filled 2019-05-05: qty 3

## 2019-05-05 MED ORDER — EPHEDRINE 5 MG/ML INJ
INTRAVENOUS | Status: AC
  Filled 2019-05-05: qty 10

## 2019-05-05 MED ORDER — FENTANYL CITRATE (PF) 100 MCG/2ML IJ SOLN
INTRAMUSCULAR | Status: AC
  Administered 2019-05-05: 50 ug via INTRAVENOUS
  Filled 2019-05-05: qty 2

## 2019-05-05 MED ORDER — BUPIVACAINE HCL (PF) 0.5 % IJ SOLN
INTRAMUSCULAR | Status: DC | PRN
  Administered 2019-05-05: 20 mL via PERINEURAL

## 2019-05-05 SURGICAL SUPPLY — 86 items
BOOTIES KNEE HIGH SLOAN (MISCELLANEOUS) ×4 IMPLANT
BURR OVAL 8 FLU 4.0X13 (MISCELLANEOUS) ×2 IMPLANT
CANNULA 5.75X71 LONG (CANNULA) ×2 IMPLANT
CANNULA TWIST IN 8.25X7CM (CANNULA) IMPLANT
CHLORAPREP W/TINT 26 (MISCELLANEOUS) ×2 IMPLANT
COOLER ICEMAN CLASSIC (MISCELLANEOUS) IMPLANT
COVER SURGICAL LIGHT HANDLE (MISCELLANEOUS) ×2 IMPLANT
COVER WAND RF STERILE (DRAPES) ×2 IMPLANT
CUTTER BONE 4.0MM X 13CM (MISCELLANEOUS) ×2 IMPLANT
DRAPE C-ARM 42X120 X-RAY (DRAPES) ×1 IMPLANT
DRAPE IMP U-DRAPE 54X76 (DRAPES) ×2 IMPLANT
DRAPE INCISE IOBAN 66X45 STRL (DRAPES) ×2 IMPLANT
DRAPE OEC MINIVIEW 54X84 (DRAPES) ×1 IMPLANT
DRAPE ORTHO SPLIT 77X108 STRL (DRAPES) ×2
DRAPE STERI 35X30 U-POUCH (DRAPES) ×2 IMPLANT
DRAPE SURG 17X23 STRL (DRAPES) ×4 IMPLANT
DRAPE SURG ORHT 6 SPLT 77X108 (DRAPES) ×2 IMPLANT
DRAPE U-SHAPE 47X51 STRL (DRAPES) ×4 IMPLANT
DRSG MEPILEX BORDER 4X8 (GAUZE/BANDAGES/DRESSINGS) ×2 IMPLANT
DRSG PAD ABDOMINAL 8X10 ST (GAUZE/BANDAGES/DRESSINGS) ×4 IMPLANT
DURAPREP 26ML APPLICATOR (WOUND CARE) ×2 IMPLANT
DW OUTFLOW CASSETTE/TUBE SET (MISCELLANEOUS) ×2 IMPLANT
ELECT REM PT RETURN 15FT ADLT (MISCELLANEOUS) ×3 IMPLANT
GAUZE SPONGE 4X4 12PLY STRL (GAUZE/BANDAGES/DRESSINGS) ×2 IMPLANT
GAUZE XEROFORM 1X8 LF (GAUZE/BANDAGES/DRESSINGS) ×2 IMPLANT
GLOVE BIO SURGEON STRL SZ7 (GLOVE) ×2 IMPLANT
GLOVE BIO SURGEON STRL SZ7.5 (GLOVE) ×2 IMPLANT
GLOVE BIOGEL PI IND STRL 7.0 (GLOVE) ×1 IMPLANT
GLOVE BIOGEL PI IND STRL 8 (GLOVE) ×1 IMPLANT
GLOVE BIOGEL PI INDICATOR 7.0 (GLOVE) ×1
GLOVE BIOGEL PI INDICATOR 8 (GLOVE) ×1
GLOVE SURG SS PI 7.0 STRL IVOR (GLOVE) ×2 IMPLANT
GOWN STRL REUS W/ TWL LRG LVL3 (GOWN DISPOSABLE) ×2 IMPLANT
GOWN STRL REUS W/ TWL XL LVL3 (GOWN DISPOSABLE) ×1 IMPLANT
GOWN STRL REUS W/TWL LRG LVL3 (GOWN DISPOSABLE) ×4 IMPLANT
GOWN STRL REUS W/TWL XL LVL3 (GOWN DISPOSABLE) ×3 IMPLANT
KIT BASIN OR (CUSTOM PROCEDURE TRAY) ×2 IMPLANT
KIT BIO-TENODESIS 3X8 DISP (MISCELLANEOUS)
KIT INSRT BABSR STRL DISP BTN (MISCELLANEOUS) IMPLANT
KIT TURNOVER KIT A (KITS) ×1 IMPLANT
NDL HYPO 25X1 1.5 SAFETY (NEEDLE) ×1 IMPLANT
NDL SCORPION MULTI FIRE (NEEDLE) IMPLANT
NDL SPNL 18GX3.5 QUINCKE PK (NEEDLE) IMPLANT
NEEDLE HYPO 25X1 1.5 SAFETY (NEEDLE) ×4 IMPLANT
NEEDLE SCORPION MULTI FIRE (NEEDLE) IMPLANT
NEEDLE SPNL 18GX3.5 QUINCKE PK (NEEDLE) ×2 IMPLANT
NS IRRIG 1000ML POUR BTL (IV SOLUTION) ×2 IMPLANT
PACK ARTHROSCOPY DSU (CUSTOM PROCEDURE TRAY) ×2 IMPLANT
PACK SHOULDER (CUSTOM PROCEDURE TRAY) ×2 IMPLANT
PACK UNIVERSAL I (CUSTOM PROCEDURE TRAY) ×2 IMPLANT
PAD ARMBOARD 7.5X6 YLW CONV (MISCELLANEOUS) ×1 IMPLANT
PAD COLD SHLDR WRAP-ON (PAD) IMPLANT
PROBE BIPOLAR ATHRO 135MM 90D (MISCELLANEOUS) ×2 IMPLANT
PROTECTOR NERVE ULNAR (MISCELLANEOUS) ×2 IMPLANT
RESTRAINT HEAD UNIVERSAL NS (MISCELLANEOUS) IMPLANT
RETRIEVER SUT HEWSON (MISCELLANEOUS) ×2 IMPLANT
SLING ARM FOAM STRAP LRG (SOFTGOODS) ×2 IMPLANT
SLING ARM FOAM STRAP MED (SOFTGOODS) IMPLANT
SLING ARM IMMOBILIZER LRG (SOFTGOODS) IMPLANT
SLING ARM IMMOBILIZER MED (SOFTGOODS) IMPLANT
STRIP CLOSURE SKIN 1/2X4 (GAUZE/BANDAGES/DRESSINGS) ×2 IMPLANT
SUCTION FRAZIER HANDLE 10FR (MISCELLANEOUS) ×1
SUCTION TUBE FRAZIER 10FR DISP (MISCELLANEOUS) ×1 IMPLANT
SUPPORT WRAP ARM LG (MISCELLANEOUS) ×2 IMPLANT
SUT ETHILON 3 0 PS 1 (SUTURE) ×2 IMPLANT
SUT FIBERWIRE #2 38 T-5 BLUE (SUTURE) ×2
SUT MNCRL AB 4-0 PS2 18 (SUTURE) ×1 IMPLANT
SUT PDS AB 1 CT  36 (SUTURE) ×1
SUT PDS AB 1 CT 36 (SUTURE) ×1 IMPLANT
SUT PDS AB 1 CT1 27 (SUTURE) IMPLANT
SUT TIGER TAPE 7 IN WHITE (SUTURE) IMPLANT
SUT VIC AB 0 CT1 27 (SUTURE) ×1
SUT VIC AB 0 CT1 27XBRD ANBCTR (SUTURE) ×1 IMPLANT
SUT VIC AB 2-0 CT1 27 (SUTURE) ×1
SUT VIC AB 2-0 CT1 TAPERPNT 27 (SUTURE) ×1 IMPLANT
SUTURE FIBERWR #2 38 T-5 BLUE (SUTURE) ×1 IMPLANT
SUTURE TAPE 1.3 40 TPR END (SUTURE) IMPLANT
SUTURETAPE 1.3 40 TPR END (SUTURE)
SYR CONTROL 10ML LL (SYRINGE) ×2 IMPLANT
SYSTEM AC REPAIR LO-PRO KNTLS (Anchor) ×1 IMPLANT
TAPE FIBER 2MM 7IN #2 BLUE (SUTURE) IMPLANT
TOWEL OR 17X26 10 PK STRL BLUE (TOWEL DISPOSABLE) ×5 IMPLANT
TOWEL OR NON WOVEN STRL DISP B (DISPOSABLE) ×2 IMPLANT
TUBING ARTHROSCOPY IRRIG 16FT (MISCELLANEOUS) ×1 IMPLANT
TUBING CONNECTING 10 (TUBING) ×4 IMPLANT
WATER STERILE IRR 1000ML POUR (IV SOLUTION) ×2 IMPLANT

## 2019-05-05 NOTE — Anesthesia Preprocedure Evaluation (Signed)
Anesthesia Evaluation  Patient identified by MRN, date of birth, ID band Patient awake    Reviewed: Allergy & Precautions, NPO status , Patient's Chart, lab work & pertinent test results  Airway Mallampati: II  TM Distance: >3 FB Neck ROM: Full    Dental no notable dental hx.    Pulmonary sleep apnea , former smoker,    Pulmonary exam normal breath sounds clear to auscultation       Cardiovascular hypertension, Pt. on medications + CAD, + Past MI and + CABG  Normal cardiovascular exam Rhythm:Regular Rate:Normal     Neuro/Psych  Headaches, negative psych ROS   GI/Hepatic Neg liver ROS, GERD  ,  Endo/Other  negative endocrine ROSdiabetes  Renal/GU negative Renal ROS  negative genitourinary   Musculoskeletal  (+) Arthritis , Osteoarthritis,    Abdominal   Peds negative pediatric ROS (+)  Hematology negative hematology ROS (+)   Anesthesia Other Findings   Reproductive/Obstetrics negative OB ROS                             Anesthesia Physical Anesthesia Plan  ASA: III  Anesthesia Plan: General   Post-op Pain Management:  Regional for Post-op pain   Induction: Intravenous  PONV Risk Score and Plan: 2 and Ondansetron, Midazolam and Treatment may vary due to age or medical condition  Airway Management Planned: Oral ETT  Additional Equipment:   Intra-op Plan:   Post-operative Plan: Extubation in OR  Informed Consent: I have reviewed the patients History and Physical, chart, labs and discussed the procedure including the risks, benefits and alternatives for the proposed anesthesia with the patient or authorized representative who has indicated his/her understanding and acceptance.     Dental advisory given  Plan Discussed with: CRNA  Anesthesia Plan Comments:         Anesthesia Quick Evaluation

## 2019-05-05 NOTE — Anesthesia Procedure Notes (Signed)
Anesthesia Regional Block: Interscalene brachial plexus block   Pre-Anesthetic Checklist: ,, timeout performed, Correct Patient, Correct Site, Correct Laterality, Correct Procedure, Correct Position, site marked, Risks and benefits discussed,  Surgical consent,  Pre-op evaluation,  At surgeon's request and post-op pain management  Laterality: Left  Prep: chloraprep       Needles:  Injection technique: Single-shot  Needle Type: Stimiplex     Needle Length: 9cm  Needle Gauge: 21     Additional Needles:   Procedures:,,,, ultrasound used (permanent image in chart),,,,  Narrative:  Start time: 05/05/2019 1:37 PM End time: 05/05/2019 1:42 PM Injection made incrementally with aspirations every 5 mL.  Performed by: Personally  Anesthesiologist: Lynda Rainwater, MD

## 2019-05-05 NOTE — Discharge Instructions (Signed)

## 2019-05-05 NOTE — Anesthesia Procedure Notes (Signed)
Procedure Name: Intubation Date/Time: 05/05/2019 3:48 PM Performed by: Lavina Hamman, CRNA Pre-anesthesia Checklist: Patient identified, Emergency Drugs available, Suction available, Patient being monitored and Timeout performed Patient Re-evaluated:Patient Re-evaluated prior to induction Oxygen Delivery Method: Circle system utilized Preoxygenation: Pre-oxygenation with 100% oxygen Induction Type: IV induction Ventilation: Mask ventilation without difficulty Laryngoscope Size: Mac and 4 Grade View: Grade II Tube type: Oral Tube size: 7.0 mm Number of attempts: 1 Airway Equipment and Method: Stylet Placement Confirmation: ETT inserted through vocal cords under direct vision,  positive ETCO2,  CO2 detector and breath sounds checked- equal and bilateral Secured at: 23 cm Tube secured with: Tape Dental Injury: Teeth and Oropharynx as per pre-operative assessment

## 2019-05-05 NOTE — Progress Notes (Signed)
Assisted Dr. Miller with left, ultrasound guided, interscalene  block. Side rails up, monitors on throughout procedure. See vital signs in flow sheet. Tolerated Procedure well.  

## 2019-05-05 NOTE — Anesthesia Postprocedure Evaluation (Signed)
Anesthesia Post Note  Patient: Kurt Aguilar  Procedure(s) Performed: SHOULDER ARTHROSCOPIC ASSISTED ACROMIO-CLAVICULAR JOINT REPAIR (Left Shoulder) ARTHROSCOPY SHOULDER (Left Shoulder)     Patient location during evaluation: PACU Anesthesia Type: General Level of consciousness: awake and alert Pain management: pain level controlled Vital Signs Assessment: post-procedure vital signs reviewed and stable Respiratory status: spontaneous breathing, nonlabored ventilation and respiratory function stable Cardiovascular status: blood pressure returned to baseline and stable Postop Assessment: no apparent nausea or vomiting Anesthetic complications: no    Last Vitals:  Vitals:   05/05/19 1715 05/05/19 1730  BP: 140/64 135/72  Pulse: 64 65  Resp: 16 12  Temp:  36.6 C  SpO2: 100% 97%    Last Pain:  Vitals:   05/05/19 1715  TempSrc:   PainSc: 0-No pain                 Lynda Rainwater
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
## 2019-05-05 NOTE — Transfer of Care (Signed)
Immediate Anesthesia Transfer of Care Note  Patient: Kurt Aguilar  Procedure(s) Performed: Procedure(s): SHOULDER ARTHROSCOPIC ASSISTED ACROMIO-CLAVICULAR JOINT REPAIR (Left) ARTHROSCOPY SHOULDER (Left)  Patient Location: PACU  Anesthesia Type:General and Regional  Level of Consciousness:  sedated, patient cooperative and responds to stimulation  Airway & Oxygen Therapy:Patient Spontanous Breathing and Patient connected to face mask oxgen  Post-op Assessment:  Report given to PACU RN and Post -op Vital signs reviewed and stable  Post vital signs:  Reviewed and stable  Last Vitals:  Vitals:   05/05/19 1405 05/05/19 1445  BP:  126/70  Pulse: 60 (!) 55  Resp: 18 (!) 21  Temp:    SpO2: 502% 714%    Complications: No apparent anesthesia complications

## 2019-05-06 ENCOUNTER — Encounter (HOSPITAL_COMMUNITY): Payer: Self-pay | Admitting: Orthopedic Surgery

## 2019-05-18 DIAGNOSIS — S43122A Dislocation of left acromioclavicular joint, 100%-200% displacement, initial encounter: Secondary | ICD-10-CM | POA: Diagnosis not present

## 2019-06-08 DIAGNOSIS — G4733 Obstructive sleep apnea (adult) (pediatric): Secondary | ICD-10-CM | POA: Diagnosis not present

## 2019-06-15 DIAGNOSIS — M25612 Stiffness of left shoulder, not elsewhere classified: Secondary | ICD-10-CM | POA: Diagnosis not present

## 2019-06-15 DIAGNOSIS — M1711 Unilateral primary osteoarthritis, right knee: Secondary | ICD-10-CM | POA: Diagnosis not present

## 2019-06-15 DIAGNOSIS — M25512 Pain in left shoulder: Secondary | ICD-10-CM | POA: Diagnosis not present

## 2019-06-15 DIAGNOSIS — M1712 Unilateral primary osteoarthritis, left knee: Secondary | ICD-10-CM | POA: Diagnosis not present

## 2019-06-15 DIAGNOSIS — S43122D Dislocation of left acromioclavicular joint, 100%-200% displacement, subsequent encounter: Secondary | ICD-10-CM | POA: Diagnosis not present

## 2019-06-21 DIAGNOSIS — M25612 Stiffness of left shoulder, not elsewhere classified: Secondary | ICD-10-CM | POA: Diagnosis not present

## 2019-06-21 DIAGNOSIS — S43122D Dislocation of left acromioclavicular joint, 100%-200% displacement, subsequent encounter: Secondary | ICD-10-CM | POA: Diagnosis not present

## 2019-06-23 DIAGNOSIS — S43122D Dislocation of left acromioclavicular joint, 100%-200% displacement, subsequent encounter: Secondary | ICD-10-CM | POA: Diagnosis not present

## 2019-06-23 DIAGNOSIS — M25612 Stiffness of left shoulder, not elsewhere classified: Secondary | ICD-10-CM | POA: Diagnosis not present

## 2019-06-27 DIAGNOSIS — S43122D Dislocation of left acromioclavicular joint, 100%-200% displacement, subsequent encounter: Secondary | ICD-10-CM | POA: Diagnosis not present

## 2019-06-27 DIAGNOSIS — M25612 Stiffness of left shoulder, not elsewhere classified: Secondary | ICD-10-CM | POA: Diagnosis not present

## 2019-06-29 DIAGNOSIS — S43122D Dislocation of left acromioclavicular joint, 100%-200% displacement, subsequent encounter: Secondary | ICD-10-CM | POA: Diagnosis not present

## 2019-06-29 DIAGNOSIS — M25612 Stiffness of left shoulder, not elsewhere classified: Secondary | ICD-10-CM | POA: Diagnosis not present

## 2019-07-04 DIAGNOSIS — M25612 Stiffness of left shoulder, not elsewhere classified: Secondary | ICD-10-CM | POA: Diagnosis not present

## 2019-07-04 DIAGNOSIS — S43122D Dislocation of left acromioclavicular joint, 100%-200% displacement, subsequent encounter: Secondary | ICD-10-CM | POA: Diagnosis not present

## 2019-07-05 ENCOUNTER — Telehealth: Payer: Self-pay | Admitting: Cardiovascular Disease

## 2019-07-05 NOTE — Telephone Encounter (Signed)
Pt called to report that he has been having increased light headedness when rising over the past 4 days.. he is fine otherwise... he biked at home for several miles today and he said without difficulty..   He has lost about 65 lbs in the last year and down to 191 lbs with nutrisystem over the past 4 months.   He said at his last OV Dr. [name] had mentioned he may need want to decrease some meds if he lost weight and he now has.   Pt has not been checking his BP but he did today at it was 99/66 and HR 73.Marland Kitchen   He has nausea with his dizziness when rising but no other sx such as chest pain, no sinus problems, and has been eating the best he can on his diet.   Will forward to Dr. [name] for review.

## 2019-07-05 NOTE — Telephone Encounter (Signed)
STAT if patient feels like he/she is going to faint   1) Are you dizzy now? Yes   2) Do you feel faint or have you passed out? Not today, but yesterday, yes  Do you have any other symptoms? Nauseated  3) Have you checked your HR and BP (record if available)? 99/66, HR 73.  Patient thinks his BP meds may be adjusted. States he has been losing weight and thinks his meds may need adjusted.

## 2019-07-06 DIAGNOSIS — M25612 Stiffness of left shoulder, not elsewhere classified: Secondary | ICD-10-CM | POA: Diagnosis not present

## 2019-07-06 DIAGNOSIS — S43122D Dislocation of left acromioclavicular joint, 100%-200% displacement, subsequent encounter: Secondary | ICD-10-CM | POA: Diagnosis not present

## 2019-07-06 NOTE — Telephone Encounter (Signed)
Would avoid lasix Could cut the metoprolol down to 12.5 BID Stay very hydrated May need to hold metoprolol if still having symptoms

## 2019-07-07 NOTE — Telephone Encounter (Signed)
Patient returning call to check on status  °

## 2019-07-07 NOTE — Telephone Encounter (Signed)
Patient states he is already taking metoprolol 12.5 mg two times a day.   In looking back at office visits, this change was made 06/30/17 and patient has been doing that since.  Advised patient to go ahead and stop the metoprolol altogether since he is already on the lower dose as Dr [name] advised.  Will route to Dr [name] to make sure this is ok.  Patient aware to stay well hydrated as well.

## 2019-07-10 NOTE — Telephone Encounter (Signed)
Okay to hold the metoprolol as detailed improve this note If blood pressure does come up we might be able to do metoprolol tartrate 12.5 mg daily rather than twice daily

## 2019-07-11 DIAGNOSIS — M25612 Stiffness of left shoulder, not elsewhere classified: Secondary | ICD-10-CM | POA: Diagnosis not present

## 2019-07-11 DIAGNOSIS — S43122D Dislocation of left acromioclavicular joint, 100%-200% displacement, subsequent encounter: Secondary | ICD-10-CM | POA: Diagnosis not present

## 2019-07-11 NOTE — Telephone Encounter (Signed)
Spoke with patient and he states that blood pressures have improved. Reviewed recommendations by provider and advised to continue monitoring blood pressures. He verbalized understanding with no further questions at this time.

## 2019-07-18 DIAGNOSIS — S43122D Dislocation of left acromioclavicular joint, 100%-200% displacement, subsequent encounter: Secondary | ICD-10-CM | POA: Diagnosis not present

## 2019-07-18 DIAGNOSIS — M25612 Stiffness of left shoulder, not elsewhere classified: Secondary | ICD-10-CM | POA: Diagnosis not present

## 2019-08-02 ENCOUNTER — Other Ambulatory Visit: Payer: Self-pay

## 2019-08-02 ENCOUNTER — Ambulatory Visit (INDEPENDENT_AMBULATORY_CARE_PROVIDER_SITE_OTHER): Payer: PPO | Admitting: Family Medicine

## 2019-08-02 ENCOUNTER — Encounter: Payer: Self-pay | Admitting: Family Medicine

## 2019-08-02 ENCOUNTER — Telehealth: Payer: Self-pay

## 2019-08-02 DIAGNOSIS — J3489 Other specified disorders of nose and nasal sinuses: Secondary | ICD-10-CM

## 2019-08-02 MED ORDER — MUPIROCIN 2 % EX OINT: 1.0000 "application " | TOPICAL_OINTMENT | Freq: Two times a day (BID) | CUTANEOUS | 0 refills | Status: DC

## 2019-08-02 MED ORDER — CEPHALEXIN 500 MG PO CAPS: 500.0000 mg | ORAL_CAPSULE | Freq: Three times a day (TID) | ORAL | 0 refills | Status: DC

## 2019-08-02 NOTE — Assessment & Plan Note (Signed)
Infected appearing papule in L nare  Adv to continue warm compresses  Keep clean with soap and water  Keflex 500 mg tid for 7d bactroban ointment topically bid  Update if not starting to improve in a week or if worsening

## 2019-08-02 NOTE — Progress Notes (Signed)
Subjective:    Patient ID: Kurt Aguilar, male    DOB: [birth year], 73 y.o.   MRN: [id number]  This visit occurred during the SARS-CoV-2 public health emergency.  Safety protocols were in place, including screening questions prior to the visit, additional usage of staff PPE, and extensive cleaning of exam room while observing appropriate contact time as indicated for disinfecting solutions.    HPI Pt presents for bump in nose that is sore   For 3 weeks he has had a bump in his nose - L nostril  Waxes and wanes symptom wise   He has used warm compresses  No drainage from it  He can feel it from the outside but not really red   Feels fine  No fever  No nose bleeds  Masks cause his nose to run   Patient Active Problem List   Diagnosis Date Noted  . Nostril infection 08/02/2019  . Colon cancer screening 02/13/2017  . Dyspnea on exertion 11/11/2016  . Diabetes mellitus type 2, uncontrolled ([place]) 08/13/2016  . Chronic heel pain, left 08/12/2016  . Upper airway cough syndrome 07/31/2016  . Obesity (BMI 30-39.9) 05/19/2016  . Routine general medical examination at a health care facility 01/27/2016  . Pulmonary infiltrates with high  ESR c/w BOOP/ idiopathic  12/19/2015  . Fatigue 12/12/2015  . Cough 11/29/2015  . Cystic kidney disease 10/01/2015  . Renal lesion 09/27/2015  . Erectile dysfunction of organic origin 09/27/2015  . BPH with obstruction/lower urinary tract symptoms 09/27/2015  . Constipation 09/24/2015  . Cyst of right kidney 09/24/2015  . CAD (coronary artery disease) 06/08/2015  . Grieving 11/22/2014  . Encounter for Medicare annual wellness exam 07/11/2013  . Prostate cancer screening 07/03/2013  . Right bundle branch block 02/22/2013  . Chronic low back pain 05/13/2011  . Obstructive sleep apnea 03/15/2010  . Hyperlipidemia 04/12/2009  . Gout 04/12/2009  . Essential hypertension 04/12/2009  . Coronary artery disease of bypass graft of native heart with  stable angina pectoris ([place]) 04/12/2009  . GERD 04/12/2009  . Renal insufficiency 04/12/2009  . DIVERTICULITIS, HX OF 04/12/2009   Past Medical History:  Diagnosis Date  . Arthritis   . Atypical migraine   . Complication of anesthesia    constipation and inability to urinate  . Diabetes ([place])   . Diverticulitis   . GERD (gastroesophageal reflux disease)   . Gout   . Heart attack ([place])   . Heart disease   . Hyperglycemia   . Hyperlipidemia   . Hypertension   . Osteoporosis   . Sleep apnea    Past Surgical History:  Procedure Laterality Date  . ACROMIO-CLAVICULAR JOINT REPAIR Left 05/05/2019   Procedure: SHOULDER ARTHROSCOPIC ASSISTED ACROMIO-CLAVICULAR JOINT REPAIR;  Surgeon: Tania Ade, MD;  Location: [facility];  Service: Orthopedics;  Laterality: Left;  . CATARACT EXTRACTION Bilateral   . CORONARY ARTERY BYPASS GRAFT     [place]  . open heart surgery  09/09/1995-1998   5 bypass  . REPAIR KNEE LIGAMENT     right  . SHOULDER ARTHROSCOPY Left 05/05/2019   Procedure: ARTHROSCOPY SHOULDER;  Surgeon: Tania Ade, MD;  Location: [facility];  Service: Orthopedics;  Laterality: Left;  . TONSILLECTOMY     Social History   Tobacco Use  . Smoking status: Former Smoker    Packs/day: 1.00    Years: 3.00    Pack years: 3.00    Types: Cigars    Quit date: 11/25/2004  Years since quitting: 14.6  . Smokeless tobacco: Never Used  . Tobacco comment: occas. cigar quit 2006  Substance Use Topics  . Alcohol use: Yes    Alcohol/week: 1.0 standard drinks    Types: 1 Standard drinks or equivalent per week    Comment: rare  . Drug use: No   Family History  Problem Relation Age of Onset  . Heart disease Mother   . Heart disease Father   . Stroke Father   . Heart attack Son 74  . Kidney disease Neg Hx   . Prostate cancer Neg Hx   . Diabetes Neg Hx    Allergies  Allergen Reactions  . Oxycodone-Acetaminophen Other (See Comments)    Stops breathing   Current  Outpatient Medications on File Prior to Visit  Medication Sig Dispense Refill  . allopurinol (ZYLOPRIM) 100 MG tablet Take 1 tablet (100 mg total) by mouth 2 (two) times daily. 180 tablet 1  . aspirin 81 MG tablet Take 81 mg by mouth daily.     . canagliflozin (INVOKANA) 100 MG TABS tablet Take 1 tablet (100 mg total) by mouth daily before breakfast. 30 tablet 11  . colchicine 0.6 MG tablet TAKE 1 TABLET BY MOUTH TWICE A DAY WITH A MEAL AS NEEDED FOR ACUTE FLARE OF GOUT 45 tablet 3  . Dextromethorphan-guaiFENesin (MUCINEX DM MAXIMUM STRENGTH) 60-1200 MG TB12 Take 1 tablet by mouth 2 (two) times daily.    Marland Kitchen ezetimibe (ZETIA) 10 MG tablet Take 1 tablet (10 mg total) by mouth daily. 90 tablet 3  . furosemide (LASIX) 20 MG tablet take 1 tablet by mouth once daily if needed (Patient taking differently: Take 20 mg by mouth daily as needed for fluid. ) 30 tablet 3  . Glucosamine HCl (GLUCOSAMINE PO) Take 1,500 mg by mouth 2 (two) times daily. Reported on 12/19/2015    . glucose blood (PRODIGY NO CODING BLOOD GLUC) test strip Check blood sugar 4 times a day and as directed. Dx E11.65 300 each 5  . Lutein 20 MG CAPS Take 1 capsule by mouth daily. Reported on 12/19/2015    . metoprolol tartrate (LOPRESSOR) 25 MG tablet Take 12.5 mg by mouth daily.    Marland Kitchen omeprazole (PRILOSEC) 20 MG capsule Take 1 capsule (20 mg total) by mouth daily. 90 capsule 3  . rosuvastatin (CRESTOR) 40 MG tablet Take 1 tablet (40 mg total) by mouth daily. 90 tablet 3   Current Facility-Administered Medications on File Prior to Visit  Medication Dose Route Frequency Provider Last Rate Last Dose  . betamethasone acetate-betamethasone sodium phosphate (CELESTONE) injection 3 mg  3 mg Intramuscular Once Edrick Kins, DPM        Review of Systems  Constitutional: Negative for activity change, appetite change, fatigue, fever and unexpected weight change.  HENT: Positive for rhinorrhea. Negative for congestion, postnasal drip, sore throat  and trouble swallowing.        Painful lesion in L nostril  Eyes: Negative for pain, redness, itching and visual disturbance.  Respiratory: Negative for cough, chest tightness, shortness of breath and wheezing.   Cardiovascular: Negative for chest pain and palpitations.  Gastrointestinal: Negative for abdominal pain, blood in stool, constipation, diarrhea and nausea.  Endocrine: Negative for cold intolerance, heat intolerance, polydipsia and polyuria.  Genitourinary: Negative for difficulty urinating, dysuria, frequency and urgency.  Musculoskeletal: Negative for arthralgias, joint swelling and myalgias.  Skin: Negative for pallor and rash.  Neurological: Negative for dizziness, tremors, weakness, numbness and headaches.  Hematological: Negative for adenopathy. Does not bruise/bleed easily.  Psychiatric/Behavioral: Negative for decreased concentration and dysphoric mood. The patient is not nervous/anxious.        Objective:   Physical Exam Constitutional:      General: He is not in acute distress.    Appearance: Normal appearance. He is not ill-appearing.  HENT:     Head: Normocephalic and atraumatic.     Right Ear: Tympanic membrane, ear canal and external ear normal.     Left Ear: Tympanic membrane, ear canal and external ear normal.     Nose: No congestion.     Comments: 2-3 mm erythematous papule in lateral L nostril w/o drainage Tender to the touch No skin changes     Mouth/Throat:     Mouth: Mucous membranes are moist.  Eyes:     General: No scleral icterus.       Right eye: No discharge.        Left eye: No discharge.     Extraocular Movements: Extraocular movements intact.     Conjunctiva/sclera: Conjunctivae normal.     Pupils: Pupils are equal, round, and reactive to light.  Neck:     Musculoskeletal: Normal range of motion and neck supple.  Cardiovascular:     Rate and Rhythm: Normal rate and regular rhythm.     Pulses: Normal pulses.     Heart sounds: Normal  heart sounds.  Pulmonary:     Effort: Pulmonary effort is normal.     Breath sounds: Normal breath sounds.  Lymphadenopathy:     Cervical: No cervical adenopathy.  Skin:    General: Skin is warm and dry.     Findings: No erythema or rash.  Neurological:     Mental Status: He is alert.     Cranial Nerves: No cranial nerve deficit.  Psychiatric:        Mood and Affect: Mood normal.           Assessment & Plan:   Problem List Items Addressed This Visit      Other   Nostril infection    Infected appearing papule in L nare  Adv to continue warm compresses  Keep clean with soap and water  Keflex 500 mg tid for 7d bactroban ointment topically bid  Update if not starting to improve in a week or if worsening        Relevant Medications   cephALEXin (KEFLEX) 500 MG capsule   mupirocin ointment (BACTROBAN) 2 %

## 2019-08-02 NOTE — Telephone Encounter (Signed)
I will see him then

## 2019-08-02 NOTE — Patient Instructions (Signed)
Continue warm compresses   Keep the area clean with soap and water   Take keflex as directed  Use the ointment twice daily for a week  Update if not starting to improve in a week or if worsening

## 2019-08-02 NOTE — Telephone Encounter (Signed)
Pt said has pimple like area on left side inside of nose. Pt has been using warm compresses; feels irritating and tender; no drainage and estimates size 1/8 ". Pt has no covid symptoms, no travel and no known exposure to + covid. Pt has in office appt today at 3 PM and blocked the 3:45.

## 2019-08-11 ENCOUNTER — Other Ambulatory Visit: Payer: Self-pay

## 2019-08-15 ENCOUNTER — Ambulatory Visit (INDEPENDENT_AMBULATORY_CARE_PROVIDER_SITE_OTHER): Payer: PPO | Admitting: Endocrinology

## 2019-08-15 ENCOUNTER — Other Ambulatory Visit: Payer: Self-pay

## 2019-08-15 ENCOUNTER — Encounter: Payer: Self-pay | Admitting: Endocrinology

## 2019-08-15 VITALS — BP 110/78 | HR 80 | Ht 70.0 in | Wt 196.4 lb

## 2019-08-15 DIAGNOSIS — E1165 Type 2 diabetes mellitus with hyperglycemia: Secondary | ICD-10-CM | POA: Diagnosis not present

## 2019-08-15 LAB — POCT GLYCOSYLATED HEMOGLOBIN (HGB A1C): Hemoglobin A1C: 5.8 % — AB (ref 4.0–5.6)

## 2019-08-15 MED ORDER — CANAGLIFLOZIN 100 MG PO TABS: 100.0000 mg | ORAL_TABLET | ORAL | 5 refills | Status: DC

## 2019-08-15 NOTE — Progress Notes (Signed)
Subjective:    Patient ID: Kurt Aguilar, male    DOB: 1946-05-07, 73 y.o.   MRN: [id number]  HPI Pt returns for f/u of diabetes mellitus: DM type: 2   Dx'ed: 9163 Complications: polyneuropathy, renal insuff, and CAD Therapy: Invokana DKA: never.  Severe hypoglycemia: never.  Pancreatitis: never.  Other: he takes intermitt prednisone for BOOP; renal insuff limits rx options; he took insulin 2018-2019.   Interval history: no recent steroids.  pt states he feels well in general.  He seldom takes the lasix  Past Medical History:  Diagnosis Date  . Arthritis   . Atypical migraine   . Complication of anesthesia    constipation and inability to urinate  . Diabetes ([place])   . Diverticulitis   . GERD (gastroesophageal reflux disease)   . Gout   . Heart attack ([place])   . Heart disease   . Hyperglycemia   . Hyperlipidemia   . Hypertension   . Osteoporosis   . Sleep apnea     Past Surgical History:  Procedure Laterality Date  . ACROMIO-CLAVICULAR JOINT REPAIR Left 05/05/2019   Procedure: SHOULDER ARTHROSCOPIC ASSISTED ACROMIO-CLAVICULAR JOINT REPAIR;  Surgeon: Tania Ade, MD;  Location: [facility];  Service: Orthopedics;  Laterality: Left;  . CATARACT EXTRACTION Bilateral   . CORONARY ARTERY BYPASS GRAFT     [place]  . open heart surgery  09/09/1995-1998   5 bypass  . REPAIR KNEE LIGAMENT     right  . SHOULDER ARTHROSCOPY Left 05/05/2019   Procedure: ARTHROSCOPY SHOULDER;  Surgeon: Tania Ade, MD;  Location: [facility];  Service: Orthopedics;  Laterality: Left;  . TONSILLECTOMY      Social History   Socioeconomic History  . Marital status: Married    Spouse name: Not on file  . Number of children: 0  . Years of education: Not on file  . Highest education level: Not on file  Occupational History  . Occupation: Armed forces technical officer: RETIRED  Social Needs  . Financial resource strain: Not on file  . Food insecurity    Worry: Not on file   Inability: Not on file  . Transportation needs    Medical: Not on file    Non-medical: Not on file  Tobacco Use  . Smoking status: Former Smoker    Packs/day: 1.00    Years: 3.00    Pack years: 3.00    Types: Cigars    Quit date: 11/25/2004    Years since quitting: 14.7  . Smokeless tobacco: Never Used  . Tobacco comment: occas. cigar quit 2006  Substance and Sexual Activity  . Alcohol use: Yes    Alcohol/week: 1.0 standard drinks    Types: 1 Standard drinks or equivalent per week    Comment: rare  . Drug use: No  . Sexual activity: Never  Lifestyle  . Physical activity    Days per week: Not on file    Minutes per session: Not on file  . Stress: Not on file  Relationships  . Social Herbalist on phone: Not on file    Gets together: Not on file    Attends religious service: Not on file    Active member of club or organization: Not on file    Attends meetings of clubs or organizations: Not on file    Relationship status: Not on file  . Intimate partner violence    Fear of current or ex partner: Not on file  Emotionally abused: Not on file    Physically abused: Not on file    Forced sexual activity: Not on file  Other Topics Concern  . Not on file  Social History Narrative  . Not on file    Current Outpatient Medications on File Prior to Visit  Medication Sig Dispense Refill  . allopurinol (ZYLOPRIM) 100 MG tablet Take 1 tablet (100 mg total) by mouth 2 (two) times daily. 180 tablet 1  . aspirin 81 MG tablet Take 81 mg by mouth daily.     . colchicine 0.6 MG tablet TAKE 1 TABLET BY MOUTH TWICE A DAY WITH A MEAL AS NEEDED FOR ACUTE FLARE OF GOUT 45 tablet 3  . Dextromethorphan-guaiFENesin (MUCINEX DM MAXIMUM STRENGTH) 60-1200 MG TB12 Take 1 tablet by mouth 2 (two) times daily.    Marland Kitchen ezetimibe (ZETIA) 10 MG tablet Take 1 tablet (10 mg total) by mouth daily. 90 tablet 3  . furosemide (LASIX) 20 MG tablet take 1 tablet by mouth once daily if needed (Patient  taking differently: Take 20 mg by mouth daily as needed for fluid. ) 30 tablet 3  . Glucosamine HCl (GLUCOSAMINE PO) Take 1,500 mg by mouth 2 (two) times daily. Reported on 12/19/2015    . glucose blood (PRODIGY NO CODING BLOOD GLUC) test strip Check blood sugar 4 times a day and as directed. Dx E11.65 300 each 5  . Lutein 20 MG CAPS Take 1 capsule by mouth daily. Reported on 12/19/2015    . metoprolol tartrate (LOPRESSOR) 25 MG tablet Take 12.5 mg by mouth daily.    . mupirocin ointment (BACTROBAN) 2 % Apply 1 application topically 2 (two) times daily. To affected area 15 g 0  . omeprazole (PRILOSEC) 20 MG capsule Take 1 capsule (20 mg total) by mouth daily. 90 capsule 3  . rosuvastatin (CRESTOR) 40 MG tablet Take 1 tablet (40 mg total) by mouth daily. 90 tablet 3   Current Facility-Administered Medications on File Prior to Visit  Medication Dose Route Frequency Provider Last Rate Last Dose  . betamethasone acetate-betamethasone sodium phosphate (CELESTONE) injection 3 mg  3 mg Intramuscular Once Edrick Kins, DPM        Allergies  Allergen Reactions  . Oxycodone-Acetaminophen Other (See Comments)    Stops breathing    Family History  Problem Relation Age of Onset  . Heart disease Mother   . Heart disease Father   . Stroke Father   . Heart attack Son 59  . Kidney disease Neg Hx   . Prostate cancer Neg Hx   . Diabetes Neg Hx     BP 110/78 (BP Location: Left Arm, Patient Position: Sitting, Cuff Size: Normal)   Pulse 80   Ht 5\' 10"  (1.778 m)   Wt 196 lb 6.4 oz (89.1 kg)   SpO2 95%   BMI 28.18 kg/m    Review of Systems Denies leg edema.      Objective:   Physical Exam VITAL SIGNS:  See vs page GENERAL: no distress Pulses: dorsalis pedis intact bilat.   MSK: no deformity of the feet CV: no leg edema Skin:  no ulcer on the feet.  normal color and temp on the feet. Neuro: sensation is intact to touch on the feet  Lab Results  Component Value Date   HGBA1C 5.8 (A)  08/15/2019    Lab Results  Component Value Date   CREATININE 1.43 (H) 05/05/2019   BUN 26 (H) 05/05/2019   NA 137 05/05/2019  K 4.5 05/05/2019   CL 100 05/05/2019   CO2 25 05/05/2019       Assessment & Plan:  Type 2 DM: well-controlled Renal insuff: This limits rx options   Patient Instructions  Please reduce the invokana to 1/2 pill per day (or 1 pill, every other day).  If you go back on steroids, check your blood sugar and call if it is over 200.   Please come back for a follow-up appointment in 4-6 months.

## 2019-08-15 NOTE — Patient Instructions (Addendum)
Please reduce the invokana to 1/2 pill per day (or 1 pill, every other day).  If you go back on steroids, check your blood sugar and call if it is over 200.   Please come back for a follow-up appointment in 4-6 months.

## 2019-09-10 ENCOUNTER — Other Ambulatory Visit: Payer: Self-pay | Admitting: Family Medicine

## 2019-09-12 ENCOUNTER — Other Ambulatory Visit: Payer: Self-pay

## 2019-09-12 DIAGNOSIS — E1165 Type 2 diabetes mellitus with hyperglycemia: Secondary | ICD-10-CM

## 2019-09-12 MED ORDER — PRODIGY NO CODING BLOOD GLUC VI STRP: ORAL_STRIP | 5 refills | Status: DC

## 2019-09-12 NOTE — Telephone Encounter (Signed)
Rx for the allopurinol refilled but pt has a endo doc that manages his DM care not [place]. Called pt and advise him that DR. Loanne Drilling with [facility] would need to refill test strips and he just needs to call their office and request the refill. Pt verbalized understanding

## 2019-09-12 NOTE — Telephone Encounter (Signed)
Patient called in regards to refill request. He was not sure if they sent the request for his test strips and wanted to make sure they were also sent  glucose blood (PRODIGY NO CODING BLOOD GLUC) test strip

## 2019-09-14 DIAGNOSIS — M1712 Unilateral primary osteoarthritis, left knee: Secondary | ICD-10-CM | POA: Diagnosis not present

## 2019-09-14 DIAGNOSIS — M25562 Pain in left knee: Secondary | ICD-10-CM | POA: Diagnosis not present

## 2019-09-19 ENCOUNTER — Telehealth: Payer: Self-pay

## 2019-09-19 NOTE — Telephone Encounter (Signed)
Outpatient Medication Detail   Disp Refills Start End   glucose blood (PRODIGY NO CODING BLOOD GLUC) test strip 300 each 5 09/12/2019    Sig: Check blood sugar 4 times a day and as directed. Dx E11.65   Sent to pharmacy as: glucose blood (PRODIGY NO CODING BLOOD GLUC) test strip   E-Prescribing Status: Receipt confirmed by pharmacy (09/12/2019  1:51 PM EST)    Above requested Rx has already been sent to the pharmacy pt is requesting

## 2019-09-19 NOTE — Telephone Encounter (Signed)
MEDICATION: glucose blood (PRODIGY NO CODING BLOOD GLUC) test strip  PHARMACY:  [id number] - [place], [place] - [address]  IS THIS A 90 DAY SUPPLY : yes  IS PATIENT OUT OF MEDICATION:   IF NOT; HOW MUCH IS LEFT:   LAST APPOINTMENT DATE: @12 /03/2019  NEXT APPOINTMENT DATE:@6 /12/2019  DO WE HAVE YOUR PERMISSION TO LEAVE A DETAILED MESSAGE:  OTHER COMMENTS:    **Let patient know to contact pharmacy at the end of the day to make sure medication is ready. **  ** Please notify patient to allow 48-72 hours to process**  **Encourage patient to contact the pharmacy for refills or they can request refills through [facility]**

## 2019-09-29 DIAGNOSIS — L570 Actinic keratosis: Secondary | ICD-10-CM | POA: Diagnosis not present

## 2019-09-29 DIAGNOSIS — L218 Other seborrheic dermatitis: Secondary | ICD-10-CM | POA: Diagnosis not present

## 2019-11-24 ENCOUNTER — Encounter: Payer: Self-pay | Admitting: Internal Medicine

## 2019-11-24 ENCOUNTER — Other Ambulatory Visit: Payer: Self-pay

## 2019-11-24 ENCOUNTER — Ambulatory Visit: Payer: PPO | Admitting: Internal Medicine

## 2019-11-24 ENCOUNTER — Ambulatory Visit (INDEPENDENT_AMBULATORY_CARE_PROVIDER_SITE_OTHER): Payer: PPO

## 2019-11-24 DIAGNOSIS — R05 Cough: Secondary | ICD-10-CM | POA: Diagnosis not present

## 2019-11-24 DIAGNOSIS — R058 Other specified cough: Secondary | ICD-10-CM

## 2019-11-24 DIAGNOSIS — R918 Other nonspecific abnormal finding of lung field: Secondary | ICD-10-CM | POA: Diagnosis not present

## 2019-11-24 MED ORDER — OMEPRAZOLE 20 MG PO CPDR: 40.0000 mg | DELAYED_RELEASE_CAPSULE | Freq: Every day | ORAL | 3 refills | Status: DC

## 2019-11-24 MED ORDER — PREDNISONE 10 MG PO TABS: ORAL_TABLET | ORAL | 0 refills | Status: DC

## 2019-11-24 MED ORDER — FAMOTIDINE 20 MG PO TABS: 20.0000 mg | ORAL_TABLET | Freq: Every day | ORAL | 3 refills | Status: DC

## 2019-11-24 MED ORDER — AZITHROMYCIN 250 MG PO TABS: ORAL_TABLET | ORAL | 0 refills | Status: DC

## 2019-11-24 NOTE — Progress Notes (Signed)
Subjective:    Patient ID: Kurt Aguilar, male    DOB: 12/13/1945,   MRN: [id number]    Brief patient profile:  74 yowm never cigarette smoker(just some cigars)  after cabg in [place] started noting recurrent winter cough regardless of where located in [place] ([place]/ [place]) with typical acute episode in [place] in Feb 2017  This  cough  more dry less severe than previous >  abx and pred > improved but did not resolve and worse when stopped it so restarted pred/abx > dx as pneumonia with variable as dz > referred to pulmonary clinic 12/19/2015 by Kurt Aguilar with 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   History of Present Illness  12/19/2015 1st [facility] office visit/ Kurt Aguilar   Chief Complaint  Patient presents with  . Pulmonary Consult    Referred by Kurt Aguilar. Pt c/o cough x 4 wks- non prod and worse in the evening and when he Aguilar down. Talking and exertion are things that trigger the cough. He has been on pred x 2 and both times cough resolved and immediately returns once done with med. He also c/o SOB- gets winded just walking from room to room at home.   while on prednisone still some weak and sob but better cough and last dose at least one week and worse Aguilar since off it assoc with sob room to room and weakness/ excess/ purulent sputum or mucus plugs / no unusual exp / no ctd.  Has new CPAP machine fall 2016  rec  Omeprazole 20 mg Take 30- 60 min before your first and last meals of the day  Prednisone 10  X 2 each day until 100% better then 10 mg daily  Dx is Brochilitis obliterans with organizing pneumonia vs eosiniphic pneumonia most likely diagnosis GERD  Diet       02/01/2016  f/u ov/Kurt Aguilar re: boop tapered pred to 10 mg as 01/28/16  Chief Complaint  Patient presents with  . Follow-up    pt states he is much improved since last office visit.  SOB has resolved.    Not limited by breathing from desired activities / concerned about wt gain from pred  rec Prednisone  10 mg with bfast x 2 week then 5 mg x 2 weeks then 5 mg even days x 2 weeks and stop  If breathing/cough worse or nausea resume previous dose    09/11/2016  f/u ov/Kurt Aguilar re: BOOP/uacs  on pred 10/5  prilosec 20 mg ac and hs  Chief Complaint  Patient presents with  . Follow-up    Increased SOB for the past 3-4 days. He gets SOB with exertion such as getting dressed or walking accross the room.  He has also started coughing more "feels like I burned my lungs"- prod with white sputum.     was doing ok on 10/5 in terms of breathing but worse gradually x one week p several weeks on the 10/5 dose  Cough some better but still needing freq tessalon on gabapentin 100 tid  rec Prednisone 10 mg  2 daily until better then 1 daily x 2 weeks then 10 alternating with 5 until return Prilosec should be 20 mg Take 30- 60 min before your first and last meals of the day  Increase gabapentin to 300 mg three times daily   .       02/10/2017  f/u ov/Kurt Aguilar re:  PF / boop down to 10 mg one half qod and maint on gabapentin  300 tid for UACS Chief Complaint  Patient presents with  . Follow-up    Pt states "doing great"- doing well on the pred 5 mg every other day.    no change ex tol with taper / no cough/ no nausea even on days off  rec Prednisone 5mg  one half every other day x 2 weeks and then stop -      04/15/2017  f/u ov/Kurt Aguilar re:  PF/ boop / ? Irritable larynx  Chief Complaint  Patient presents with  . Follow-up    Pt states developed a cold mid July 2018 while traveling to [place]. He is feeling better now and not coughing much. No new co's today.   walked all over [place] up hills/ in heat no problem with sob   Still occ throat clearing better on gabapentin 300 tid and gerd rx / daytime only  rec Once the protonix runs out,  Ok to try omeprazole 20 mg before bfrast and pepcid at bedtime until you see Kurt Aguilar  Once you run out of gabapentin ok to leave it off and call if refills needed      08/12/18  NP  cough? Uri  Augmentin 875mg  Twice daily  For 1 week , take w/ food .  Mucinex DM Twice daily  As needed  Cough/congestion .  Tessalon Three times a day  As needed  Cough .  Begin Prednisone 20mg  daily , take with food.  Chest xray  ok      08/26/2018  f/u ov/Kurt Aguilar re: flare of cough related to uri, much better  Chief Complaint  Patient presents with  . Follow-up    Cough is much improved. He very rarely uses his albuterol inhaler.   Dyspnea:  Pool x 2.5 h Cough: gone/ still has urge to clear to clear throat with sense of throat and chest "congestion" but no mucus Sleeping: ok / adjustable x 8 in hob rec  Prednisone  10 mg take a half x 3 days and stop  Whenever coughing , omeprazole 40 mg Take 30-60 min before first meal of the day and take pepcid 20 mg after supper (if not improving then omeprazole should be:  Take 30-60 min before first meal of the day ) For drainage / throat tickle try take CHLORPHENIRAMINE  4 mg - take one every 4 hours as needed - available over the counter- may cause drowsiness so start with just a bedtime dose or two and see how you tolerate it before trying in daytime    09/20/2018  f/u ov/Kurt Aguilar re: uacs flare early dec 2019 - no longer on gabapentin and only using 20 mg ppi/ 10 mg pepcid  Chief Complaint  Patient presents with  . Acute Visit    Cough flared back up approx 2 wks ago, but has been getting better. He is not producing any sputum.   Dyspnea:  Water aerobics tol fine   Cough: esp in am but dry Sleeping: elevate hob only/ on cpap   Some sense of pnds not responding to zyrtec  rec Whenever coughing , omeprazole 40 mg (20 x 2) Take 30-60 min before first meal of the day and take pepcid 20 mg (10 x 2)  after supper - then when better ok to resume previous routine  For drainage / throat tickle try take CHLORPHENIRAMINE  4 mg -[facility]lortabs at [facility])  take one every 4 hours   11/24/2019  Acute extended  ov/Kurt Aguilar re: did fine for over a  year omeprazole  20mg  ac only then 2 weeks prior to OV on arrival from [place] Onset 6 pm cough s assoc obvious sniffles or obvious pnds but more severe cough then covid shot 1 week prior to OV  And worse with more severe cough and sob and throat burning and mucus turing a little yellow  Chief Complaint  Patient presents with  . Acute Visit    Pt c/o cough and "chest burning" for the past wk. He also c/o increased SOB. Cough is occ prod with clear to yellow sputum.    Dyspnea:  With activity whereas previously not limited including hills  Cough: esp after supper slt more productive  Sleeping: on cpap / ? Worse noct hb > just started pepcid hs   SABA use: none  02: none     No obvious pattern in day to day or daytime variability or assoc  mucus plugs or hemoptysis or cp or chest tightness, subjective wheeze or overt sinus symptoms.    . Also denies any obvious fluctuation of symptoms with weather or environmental changes or other aggravating or alleviating factors except as outlined above   No unusual exposure hx or h/o childhood pna/ asthma or knowledge of premature birth.  Current Allergies, Complete Past Medical History, Past Surgical History, Family History, and Social History were reviewed in [employer] record.  ROS  The following are not active complaints unless bolded Hoarseness, sore throat, dysphagia, dental problems, itching, sneezing,  nasal congestion or discharge of excess mucus or purulent secretions, ear ache,   fever, chills, sweats, unintended wt loss or wt gain, classically pleuritic or exertional cp,  orthopnea pnd or arm/hand swelling  or leg swelling, presyncope, palpitations, abdominal pain, anorexia, nausea, vomiting, diarrhea  or change in bowel habits or change in bladder habits, change in stools or change in urine, dysuria, hematuria,  rash, arthralgias, visual complaints, headache, numbness, weakness or ataxia or problems with walking or coordination,  change in  mood or  memory.        Current Meds  Medication Sig  . allopurinol (ZYLOPRIM) 100 MG tablet TAKE 1 TABLET BY MOUTH TWICE A DAY  . aspirin 81 MG tablet Take 81 mg by mouth daily.   . canagliflozin (INVOKANA) 100 MG TABS tablet Take 1 tablet (100 mg total) by mouth every other day.  . colchicine 0.6 MG tablet TAKE 1 TABLET BY MOUTH TWICE A DAY WITH A MEAL AS NEEDED FOR ACUTE FLARE OF GOUT  . Dextromethorphan-guaiFENesin (MUCINEX DM MAXIMUM STRENGTH) 60-1200 MG TB12 Take 1 tablet by mouth 2 (two) times daily.  Marland Kitchen ezetimibe (ZETIA) 10 MG tablet Take 1 tablet (10 mg total) by mouth daily.  . furosemide (LASIX) 20 MG tablet take 1 tablet by mouth once daily if needed (Patient taking differently: Take 20 mg by mouth daily as needed for fluid. )  . Glucosamine HCl (GLUCOSAMINE PO) Take 1,500 mg by mouth 2 (two) times daily. Reported on 12/19/2015  . glucose blood (PRODIGY NO CODING BLOOD GLUC) test strip Check blood sugar 4 times a day and as directed. Dx E11.65  . Lutein 20 MG CAPS Take 1 capsule by mouth daily. Reported on 12/19/2015  . metoprolol tartrate (LOPRESSOR) 25 MG tablet Take 12.5 mg by mouth daily.  . mupirocin ointment (BACTROBAN) 2 % Apply 1 application topically 2 (two) times daily. To affected area  . omeprazole (PRILOSEC) 20 MG capsule Take 1 capsule (20 mg total) by mouth daily.  . rosuvastatin (  CRESTOR) 40 MG tablet Take 1 tablet (40 mg total) by mouth daily.   Current Facility-Administered Medications for the 11/24/19 encounter (Office Visit) with Kurt Rockers, MD  Medication  . betamethasone acetate-betamethasone sodium phosphate (CELESTONE) injection 3 mg                    Objective:   Physical Exam   slt hoarse wm nad      11/24/2019        206  09/20/2018        222  08/26/2018      222 07/29/2017      232  04/15/2017          243  02/10/2017          250  12/19/2016        250 11/11/2016          258  09/11/2016          252  07/29/2016      244  05/16/2016           238  02/01/2016        235  01/07/2016          228   12/19/15 229 lb 4 oz (103.987 kg)  12/19/15 229 lb 4 oz (103.987 kg)  12/19/15 227 lb (102.967 kg)     Vital signs reviewed  11/24/2019  - Note at rest 02 sats  98% on RA        HEENT : pt wearing mask not removed for exam due to covid -19 concerns.    NECK :  without JVD/Nodes/TM/ nl carotid upstrokes bilaterally   LUNGS: no acc muscle use,  Nl contour chest which is clear to A and P bilaterally without cough on insp or exp maneuvers   CV:  RRR  no s3 or murmur or increase in P2, and no edema   ABD:  soft and nontender with nl inspiratory excursion in the supine position. No bruits or organomegaly appreciated, bowel sounds nl  MS:  Nl gait/ ext warm without deformities, calf tenderness, cyanosis or clubbing No obvious joint restrictions   SKIN: warm and dry without lesions    NEURO:  alert, approp, nl sensorium with  no motor or cerebellar deficits apparent.    CXR PA and Lateral:   11/24/2019 :    I personally reviewed images and   impression as follows:   slt increase in markins esp in L base but no def as dz                  Assessment & Plan:

## 2019-11-24 NOTE — Patient Instructions (Addendum)
Prednisone 10 mg take  4 each am x 2 days,   2 each am x 2 days,  1 each am x 2 days and stop   Zpak     Whenever coughing , omeprazole 40 mg (20mg   x 2) Take 30-60 min before first meal of the day and take pepcid 20 mg (10mg  x 2)  after supper - then when all  better ok to resume previous routine    For drainage / throat tickle try take CHLORPHENIRAMINE  4 mg  (Chlortab 4mg   at [facility] should be easiest to find in the green box)  take one every 4 hours as needed - available over the counter- may cause drowsiness so start with just a bedtime dose or two and see how you tolerate it before trying in daytime    For cough > tessilon 200 mg up to every 6 hours as needed   GERD (REFLUX)  is an extremely common cause of respiratory symptoms just like yours , many times with no obvious heartburn at all.    It can be treated with medication, but also with lifestyle changes including elevation of the head of your bed (ideally with 6 -8inch blocks under the headboard of your bed),  Smoking cessation, avoidance of late meals, excessive alcohol, and avoid fatty foods, chocolate, peppermint, colas, red wine, and acidic juices such as orange juice.  NO MINT OR MENTHOL PRODUCTS SO NO COUGH DROPS  USE SUGARLESS CANDY INSTEAD (Jolley ranchers or Stover's or Life Savers) or even ice chips will also do - the key is to swallow to prevent all throat clearing. NO OIL BASED VITAMINS - use powdered substitutes.  Avoid fish oil when coughing.    Please remember to go to the  x-ray department  for your tests - we will call you with the results when they are available    Call if not all better by first of week       .

## 2019-11-25 ENCOUNTER — Encounter: Payer: Self-pay | Admitting: Internal Medicine

## 2019-11-25 NOTE — Assessment & Plan Note (Addendum)
Recurrent pattern since feb 2017 Trial of gabapentin 100 tid 07/29/2016 >  Still tessalon dep 09/11/2016 so increased to 300 tid and ppi bid ac > not improved 11/11/2016 > rec max gerd rx and sugarless candy > resolved 12/19/2016  And off gabapentin 04/2017  - recurred early dec 2019 while off gabapentin and low dose ppi only  - 09/20/2018 rec max gerd rx and 1st gen H1 blockers per guidelines  And if not better start back on gabapentin  - 11/24/2019 recurred around November 07 2019 on return from car trip to [place] > rx pred/zpak/max gerd rx       Of the three most common causes of  Sub-acute / recurrent or chronic cough, only one (GERD)  can actually contribute to/ trigger  the other two (asthma and post nasal drip syndrome)  and perpetuate the cylce of cough.  While not intuitively obvious, many patients with chronic low grade reflux do not cough until there is a primary insult that disturbs the protective epithelial barrier and exposes sensitive nerve endings.   This is typically viral but can due to PNDS and  either may apply here.     >>>The point is that once this occurs, it is difficult to eliminate the cycle  using anything but a maximally effective acid suppression regimen at least in the short run, accompanied by an appropriate diet to address non acid GERD and control / eliminate the cough itself with tessalon 200 / eliminate pnds with 1st gen H1 blockers per guidelines, and also added 6 days of Prednisone in case of component of Th-2 driven upper or lower airways inflammation (if cough responds short term only to relapse befor return while will on rx for uacs that would point to allergic rhinitis/ asthma or eos bronchitis)     Added zpak and cautioned not to use with colchicine Advised sugars may transiently rise on pred but monitors regulalry anyway

## 2019-11-25 NOTE — Assessment & Plan Note (Signed)
Onset of symptoms feb 2017 with "photo neg pumonary edema pattern"  Baseline PFTs 03/18/10  FVC  3.59 (80%) with ERV 67% and dlco 75 corrects to 106  - Allergy profile 12/19/15 >  Eos 0.2 /  IgE  89/ Pos Cat RAST only  12/19/2015  ESR 93 > started on prednisone 20 mg daily  HSP profile 12/19/15 >  Neg  - 01/07/2016 ESR down to 18 but only 30% improved symptomatically > rec continue 20 mg daily until better then 10 mg daily  - 02/01/2016 rec taper off x 6 weeks then ov > 05/16/2016 ESR = 58> restarted pred 06/03/16  - Collagen Vasc dz screen 07/29/2016  Neg  - PFT's  11/11/2016  FEV1 1.91 (56 % ) ratio 72  p 7 % improvement from saba p nothing prior to study with DLCO  42/43 % corrects to 77 % for alv volume but ERV 35%  - ESR  20 on 10 a/w 15 as  Of 11/11/2016 > rec try 10 mg daily  - Prednisone 5 mg daily as of 12/15/16 > taper to 5 mg qod by 02/10/2017 - 02/10/2017 ESR =  28  rec taper off x 2 weeks > no flare - 04/20/2017 ESR = 32 off pred since 02/2017  - 08/12/18  ESR = 16 with onset of cough   No def as dz/ doubt recurrent boop.         Each maintenance medication was reviewed in detail including emphasizing most importantly the difference between maintenance and prns and under what circumstances the prns are to be triggered using an action plan format where appropriate.  Total time for H and P, chart review, counseling,  and generating customized AVS unique to this acute extended office visit / charting = 30 min

## 2019-11-28 ENCOUNTER — Other Ambulatory Visit: Payer: Self-pay

## 2019-11-28 ENCOUNTER — Telehealth: Payer: Self-pay | Admitting: Endocrinology

## 2019-11-28 DIAGNOSIS — H353131 Nonexudative age-related macular degeneration, bilateral, early dry stage: Secondary | ICD-10-CM | POA: Diagnosis not present

## 2019-11-28 DIAGNOSIS — E1165 Type 2 diabetes mellitus with hyperglycemia: Secondary | ICD-10-CM

## 2019-11-28 DIAGNOSIS — E119 Type 2 diabetes mellitus without complications: Secondary | ICD-10-CM | POA: Diagnosis not present

## 2019-11-28 DIAGNOSIS — H02889 Meibomian gland dysfunction of unspecified eye, unspecified eyelid: Secondary | ICD-10-CM | POA: Diagnosis not present

## 2019-11-28 MED ORDER — ACCU-CHEK GUIDE VI STRP: 1.0000 | ORAL_STRIP | Freq: Four times a day (QID) | 0 refills | Status: DC

## 2019-11-28 MED ORDER — ACCU-CHEK GUIDE ME W/DEVICE KIT: 1.0000 | PACK | Freq: Four times a day (QID) | 0 refills | Status: DC

## 2019-11-28 MED ORDER — ACCU-CHEK SOFTCLIX LANCETS MISC: 1.0000 | Freq: Four times a day (QID) | 0 refills | Status: DC

## 2019-11-28 NOTE — Telephone Encounter (Signed)
E-Prescribing Status: Receipt confirmed by pharmacy (11/28/2019  8:51 AM EDT)

## 2019-11-28 NOTE — Telephone Encounter (Signed)
MEDICATION: New Blood Sugar Meter, Test Strips, Lancets  PHARMACY:  [facility] [address]   IS THIS A 90 DAY SUPPLY :   IS PATIENT OUT OF MEDICATION: YES  IF NOT; HOW MUCH IS LEFT:   LAST APPOINTMENT DATE: @1 /07/2020  NEXT APPOINTMENT DATE:@6 /12/2019  DO WE HAVE YOUR PERMISSION TO LEAVE A DETAILED MESSAGE:  OTHER COMMENTS: patients current meter/strips etc are not compatible any longer due to manufacturing changes.  Needs whole new set up    **Let patient know to contact pharmacy at the end of the day to make sure medication is ready. **  ** Please notify patient to allow 48-72 hours to process**  **Encourage patient to contact the pharmacy for refills or they can request refills through [facility]**

## 2019-12-06 DIAGNOSIS — G4733 Obstructive sleep apnea (adult) (pediatric): Secondary | ICD-10-CM | POA: Diagnosis not present

## 2019-12-13 LAB — HM DIABETES EYE EXAM

## 2020-01-03 ENCOUNTER — Other Ambulatory Visit: Payer: Self-pay

## 2020-01-03 ENCOUNTER — Ambulatory Visit (INDEPENDENT_AMBULATORY_CARE_PROVIDER_SITE_OTHER): Payer: PPO | Admitting: Family Medicine

## 2020-01-03 ENCOUNTER — Encounter: Payer: Self-pay | Admitting: Family Medicine

## 2020-01-03 ENCOUNTER — Ambulatory Visit
Admission: RE | Admit: 2020-01-03 | Discharge: 2020-01-03 | Disposition: A | Discharge status: Home / Self Care | Payer: PPO | Source: Ambulatory Visit | Attending: Family Medicine | Admitting: Family Medicine

## 2020-01-03 DIAGNOSIS — M545 Low back pain, unspecified: Secondary | ICD-10-CM

## 2020-01-03 DIAGNOSIS — E1165 Type 2 diabetes mellitus with hyperglycemia: Secondary | ICD-10-CM | POA: Diagnosis not present

## 2020-01-03 DIAGNOSIS — M549 Dorsalgia, unspecified: Secondary | ICD-10-CM | POA: Insufficient documentation

## 2020-01-03 DIAGNOSIS — I25708 Atherosclerosis of coronary artery bypass graft(s), unspecified, with other forms of angina pectoris: Secondary | ICD-10-CM

## 2020-01-03 MED ORDER — HYDROCODONE-ACETAMINOPHEN 5-325 MG PO TABS: 1.0000 | ORAL_TABLET | Freq: Four times a day (QID) | ORAL | 0 refills | Status: DC | PRN

## 2020-01-03 MED ORDER — METHOCARBAMOL 500 MG PO TABS: 500.0000 mg | ORAL_TABLET | Freq: Three times a day (TID) | ORAL | 1 refills | Status: DC | PRN

## 2020-01-03 MED ORDER — ONDANSETRON HCL 8 MG PO TABS: 8.0000 mg | ORAL_TABLET | Freq: Three times a day (TID) | ORAL | 0 refills | Status: DC | PRN

## 2020-01-03 NOTE — Progress Notes (Signed)
Subjective:    Patient ID: Kurt Aguilar, male    DOB: 1946-05-23, 74 y.o.   MRN: [id number]  This visit occurred during the SARS-CoV-2 public health emergency.  Safety protocols were in place, including screening questions prior to the visit, additional usage of staff PPE, and extensive cleaning of exam room while observing appropriate contact time as indicated for disinfecting solutions.    HPI Pt presents with c/o back pain   Wt Readings from Last 3 Encounters:  11/24/19 206 lb (93.4 kg)  08/15/19 196 lb 6.4 oz (89.1 kg)  08/02/19 201 lb 4 oz (91.3 kg)     Started Thursday  Had a leg cramp in his leg and he tried to get out of bed quickly- tripped and fell out of bed  cpap pulled off and landed on L side   Wife was able to get him up  Not a lot of pain initially   The next night the pain in back got worse  L low back - radiates to the right  A back brace helps a little bit   Hurts to move in any direction  Has tried to walk with a cane  Worse after inactivity - goes to move and it is bad  Takes a while to get in and out of bed   Taking tylenol  Took colchicine on the off chance of gout  Heat helps a little bit  Has been able to take a shower   Worse today than yesterday   This grabs him -sharp like a knife In between - a dull ache   Has had some chronic back pain through the years  Almost had surgery at age 7  (wanted to fuse L5 ? To another vert)  Unsure of disc disease     Has never done PT for back  Has never had epidural injections for back  Has not had orthopedic visit for this in the back   Not on prednisone  Had adv reaction to oxycodone  Has had vicodin in the paste since then  Never had tramadol in the past   Has renal insuff Lab Results  Component Value Date   CREATININE 1.43 (H) 05/05/2019   BUN 26 (H) 05/05/2019   NA 137 05/05/2019   K 4.5 05/05/2019   CL 100 05/05/2019   CO2 25 05/05/2019   Avoiding nsaids  Also prefers to avoid  prednisone since he just got off of it    Patient Active Problem List   Diagnosis Date Noted  . Acute back pain 01/03/2020  . Nostril infection 08/02/2019  . Colon cancer screening 02/13/2017  . Dyspnea on exertion 11/11/2016  . Diabetes mellitus type 2, uncontrolled ([place]) 08/13/2016  . Chronic heel pain, left 08/12/2016  . Upper airway cough syndrome 07/31/2016  . Obesity (BMI 30-39.9) 05/19/2016  . Routine general medical examination at a health care facility 01/27/2016  . Pulmonary infiltrates with high  ESR c/w BOOP/ idiopathic  12/19/2015  . Fatigue 12/12/2015  . Cough 11/29/2015  . Cystic kidney disease 10/01/2015  . Renal lesion 09/27/2015  . Erectile dysfunction of organic origin 09/27/2015  . BPH with obstruction/lower urinary tract symptoms 09/27/2015  . Constipation 09/24/2015  . Cyst of right kidney 09/24/2015  . CAD (coronary artery disease) 06/08/2015  . Grieving 11/22/2014  . Encounter for Medicare annual wellness exam 07/11/2013  . Prostate cancer screening 07/03/2013  . Right bundle branch block 02/22/2013  . Chronic low back pain 05/13/2011  .  Obstructive sleep apnea 03/15/2010  . Hyperlipidemia 04/12/2009  . Gout 04/12/2009  . Essential hypertension 04/12/2009  . Coronary artery disease of bypass graft of native heart with stable angina pectoris ([place]) 04/12/2009  . GERD 04/12/2009  . Renal insufficiency 04/12/2009  . DIVERTICULITIS, HX OF 04/12/2009   Past Medical History:  Diagnosis Date  . Arthritis   . Atypical migraine   . Complication of anesthesia    constipation and inability to urinate  . Diabetes ([place])   . Diverticulitis   . GERD (gastroesophageal reflux disease)   . Gout   . Heart attack ([place])   . Heart disease   . Hyperglycemia   . Hyperlipidemia   . Hypertension   . Osteoporosis   . Sleep apnea    Past Surgical History:  Procedure Laterality Date  . ACROMIO-CLAVICULAR JOINT REPAIR Left 05/05/2019   Procedure: SHOULDER  ARTHROSCOPIC ASSISTED ACROMIO-CLAVICULAR JOINT REPAIR;  Surgeon: Tania Ade, MD;  Location: [facility];  Service: Orthopedics;  Laterality: Left;  . CATARACT EXTRACTION Bilateral   . CORONARY ARTERY BYPASS GRAFT     [place]  . open heart surgery  09/09/1995-1998   5 bypass  . REPAIR KNEE LIGAMENT     right  . SHOULDER ARTHROSCOPY Left 05/05/2019   Procedure: ARTHROSCOPY SHOULDER;  Surgeon: Tania Ade, MD;  Location: [facility];  Service: Orthopedics;  Laterality: Left;  . TONSILLECTOMY     Social History   Tobacco Use  . Smoking status: Former Smoker    Packs/day: 1.00    Years: 3.00    Pack years: 3.00    Types: Cigars    Quit date: 11/25/2004    Years since quitting: 15.1  . Smokeless tobacco: Never Used  . Tobacco comment: occas. cigar quit 2006  Substance Use Topics  . Alcohol use: Yes    Alcohol/week: 1.0 standard drinks    Types: 1 Standard drinks or equivalent per week    Comment: rare  . Drug use: No   Family History  Problem Relation Age of Onset  . Heart disease Mother   . Heart disease Father   . Stroke Father   . Heart attack Son 50  . Kidney disease Neg Hx   . Prostate cancer Neg Hx   . Diabetes Neg Hx    Allergies  Allergen Reactions  . Oxycodone-Acetaminophen Other (See Comments)    Stops breathing   Current Outpatient Medications on File Prior to Visit  Medication Sig Dispense Refill  . Accu-Chek Softclix Lancets lancets 1 each by Other route 4 (four) times daily. E11.65 400 each 0  . allopurinol (ZYLOPRIM) 100 MG tablet TAKE 1 TABLET BY MOUTH TWICE A DAY 180 tablet 1  . aspirin 81 MG tablet Take 81 mg by mouth daily.     . Blood Glucose Monitoring Suppl (ACCU-CHEK GUIDE ME) w/Device KIT 1 each by Does not apply route 4 (four) times daily. E11.65 1 kit 0  . canagliflozin (INVOKANA) 100 MG TABS tablet Take 1 tablet (100 mg total) by mouth every other day. 30 tablet 5  . colchicine 0.6 MG tablet TAKE 1 TABLET BY MOUTH TWICE A DAY WITH A  MEAL AS NEEDED FOR ACUTE FLARE OF GOUT 45 tablet 3  . Dextromethorphan-guaiFENesin (MUCINEX DM MAXIMUM STRENGTH) 60-1200 MG TB12 Take 1 tablet by mouth 2 (two) times daily.    Marland Kitchen ezetimibe (ZETIA) 10 MG tablet Take 1 tablet (10 mg total) by mouth daily. 90 tablet 3  . famotidine (PEPCID) 20 MG  tablet Take 1 tablet (20 mg total) by mouth at bedtime. 30 tablet 3  . furosemide (LASIX) 20 MG tablet take 1 tablet by mouth once daily if needed (Patient taking differently: Take 20 mg by mouth daily as needed for fluid. ) 30 tablet 3  . Glucosamine HCl (GLUCOSAMINE PO) Take 1,500 mg by mouth 2 (two) times daily. Reported on 12/19/2015    . glucose blood (ACCU-CHEK GUIDE) test strip 1 each by Other route 4 (four) times daily. E11.65 400 each 0  . Lutein 20 MG CAPS Take 1 capsule by mouth daily. Reported on 12/19/2015    . metoprolol tartrate (LOPRESSOR) 25 MG tablet Take 12.5 mg by mouth daily.    . mupirocin ointment (BACTROBAN) 2 % Apply 1 application topically 2 (two) times daily. To affected area 15 g 0  . omeprazole (PRILOSEC) 20 MG capsule Take 2 capsules (40 mg total) by mouth daily before breakfast. 60 capsule 3  . rosuvastatin (CRESTOR) 40 MG tablet Take 1 tablet (40 mg total) by mouth daily. 90 tablet 3   Current Facility-Administered Medications on File Prior to Visit  Medication Dose Route Frequency Provider Last Rate Last Admin  . betamethasone acetate-betamethasone sodium phosphate (CELESTONE) injection 3 mg  3 mg Intramuscular Once Edrick Kins, DPM          Review of Systems  Constitutional: Negative for activity change, appetite change, fatigue, fever and unexpected weight change.  HENT: Negative for congestion, rhinorrhea, sore throat and trouble swallowing.   Eyes: Negative for pain, redness, itching and visual disturbance.  Respiratory: Negative for cough, chest tightness, shortness of breath and wheezing.   Cardiovascular: Negative for chest pain and palpitations.   Gastrointestinal: Negative for abdominal pain, blood in stool, constipation, diarrhea and nausea.  Endocrine: Negative for cold intolerance, heat intolerance, polydipsia and polyuria.  Genitourinary: Negative for difficulty urinating, dysuria, frequency and urgency.  Musculoskeletal: Positive for back pain. Negative for arthralgias, joint swelling and myalgias.       Difficult to walk due to back pain  Used wheelchair to get here   Skin: Negative for pallor and rash.  Neurological: Negative for dizziness, tremors, weakness, numbness and headaches.  Hematological: Negative for adenopathy. Does not bruise/bleed easily.  Psychiatric/Behavioral: Negative for decreased concentration and dysphoric mood. The patient is not nervous/anxious.        Objective:   Physical Exam Constitutional:      General: He is not in acute distress.    Appearance: Normal appearance. He is well-developed. He is not ill-appearing.     Comments: In significant discomfort   HENT:     Head: Normocephalic and atraumatic.  Eyes:     General: No scleral icterus.    Conjunctiva/sclera: Conjunctivae normal.     Pupils: Pupils are equal, round, and reactive to light.  Cardiovascular:     Rate and Rhythm: Normal rate and regular rhythm.  Pulmonary:     Effort: Pulmonary effort is normal.     Breath sounds: Normal breath sounds. No wheezing or rales.  Abdominal:     General: Bowel sounds are normal. There is no distension.     Palpations: Abdomen is soft.     Tenderness: There is no abdominal tenderness.  Musculoskeletal:        General: Tenderness present.     Cervical back: Normal range of motion and neck supple.     Thoracic back: Bony tenderness present. No edema. Decreased range of motion.     Lumbar  back: Spasms and tenderness present. No swelling, edema, deformity or bony tenderness. Decreased range of motion. Negative right straight leg raise test and negative left straight leg raise test.     Comments:  Spinous process tenderness- high lumbar and low thoracic  Spasm noted in lumbar musculature worse on the left  Nl rom of legs and hips  Any amount of movement or position change causes sharp pain (difficult for him to tolerate)  No bruising/skin change or swelling  No neuro changes  Unable to stand without significant assist-and was unable to get on table (examined in wheelchair)   Lymphadenopathy:     Cervical: No cervical adenopathy.  Skin:    General: Skin is warm and dry.     Coloration: Skin is not pale.     Findings: No erythema or rash.  Neurological:     Mental Status: He is alert.     Cranial Nerves: No cranial nerve deficit.     Sensory: No sensory deficit.     Motor: No atrophy or abnormal muscle tone.     Coordination: Coordination normal.     Deep Tendon Reflexes: Reflexes are normal and symmetric.     Comments: Negative SLR           Assessment & Plan:   Problem List Items Addressed This Visit      Other   Acute back pain    In pt who has hx of chronic intermittent back pain -this is different/worse  Followed a fall out of bed  Pain is high lumbar-a bit worse on the L  Past chronic steroids / high risk for low bone mass and so compression fx is a concern  Unable to do xray today due to severity of pain Px norco and methocarbamol to try and control pain  Will use heat- slowly walk when able  Return tomorrow and for xray (hopefully will tolerate) No neuro s/s today-re assuring        Relevant Medications   methocarbamol (ROBAXIN) 500 MG tablet   HYDROcodone-acetaminophen (NORCO) 5-325 MG tablet   Other Relevant Orders   DG Lumbar Spine Complete   DG Thoracic Spine W/Swimmers

## 2020-01-03 NOTE — Patient Instructions (Addendum)
Let's try to get pain under control  norco as needed -with food / zofran if needed for nausea  Methocarbamol for spasm    Caution of sedation and also for falls  If you have a walker -use it   Ice/heat -whichever helps more  Try to do some walking (slow) when you feel up to it   We can try to get a lumbar xray tomorrow when pain is better controlled

## 2020-01-03 NOTE — Assessment & Plan Note (Signed)
In pt who has hx of chronic intermittent back pain -this is different/worse  Followed a fall out of bed  Pain is high lumbar-a bit worse on the L  Past chronic steroids / high risk for low bone mass and so compression fx is a concern  Unable to do xray today due to severity of pain Px norco and methocarbamol to try and control pain  Will use heat- slowly walk when able  Return tomorrow and for xray (hopefully will tolerate) No neuro s/s today-re assuring

## 2020-01-03 NOTE — Assessment & Plan Note (Signed)
Continues to see Dr Loanne Drilling Much improved off prednisone

## 2020-01-03 NOTE — Assessment & Plan Note (Signed)
No recent angina  

## 2020-01-04 ENCOUNTER — Ambulatory Visit (INDEPENDENT_AMBULATORY_CARE_PROVIDER_SITE_OTHER)
Admission: RE | Admit: 2020-01-04 | Discharge: 2020-01-04 | Disposition: A | Discharge status: Home / Self Care | Payer: PPO | Source: Ambulatory Visit | Attending: Family Medicine | Admitting: Family Medicine

## 2020-01-04 DIAGNOSIS — M545 Low back pain: Secondary | ICD-10-CM

## 2020-01-04 DIAGNOSIS — S299XXA Unspecified injury of thorax, initial encounter: Secondary | ICD-10-CM | POA: Diagnosis not present

## 2020-01-04 DIAGNOSIS — M546 Pain in thoracic spine: Secondary | ICD-10-CM | POA: Diagnosis not present

## 2020-01-16 ENCOUNTER — Other Ambulatory Visit: Payer: Self-pay

## 2020-01-16 ENCOUNTER — Telehealth: Payer: Self-pay | Admitting: Family Medicine

## 2020-01-16 ENCOUNTER — Telehealth: Payer: Self-pay | Admitting: Endocrinology

## 2020-01-16 DIAGNOSIS — E1165 Type 2 diabetes mellitus with hyperglycemia: Secondary | ICD-10-CM

## 2020-01-16 MED ORDER — FREESTYLE LANCETS MISC: 1.0000 | Freq: Four times a day (QID) | 0 refills | Status: DC

## 2020-01-16 MED ORDER — FREESTYLE LITE TEST VI STRP: 1.0000 | ORAL_STRIP | Freq: Four times a day (QID) | 0 refills | Status: DC

## 2020-01-16 MED ORDER — FREESTYLE LITE DEVI: 1.0000 | Freq: Four times a day (QID) | 0 refills | Status: DC

## 2020-01-16 NOTE — Telephone Encounter (Signed)
Pt notified xrays ready for pick up

## 2020-01-16 NOTE — Telephone Encounter (Signed)
Please get him a disk if possible  Thanks

## 2020-01-16 NOTE — Telephone Encounter (Signed)
Patient called re: RX previously sent for Glucose Monitoring Device Kit and supplies (e.g. test strips) is not covered by insurance. Patient states he called his insurance company who told patient they cover the following Device/supplies:  Freestyle  Precision One Touch  Patient requests that a new Rx for one of the Glucose Monitoring Meter/supplies (e.g. test strips, etc)  [facility]   [id number] [place]  [phone] Fax:  [phone]

## 2020-01-16 NOTE — Telephone Encounter (Signed)
Pt called back. He was able to call [facility] and get an appointment for Friday. He is needing a copy of his xray to take to his appointment.

## 2020-01-16 NOTE — Telephone Encounter (Signed)
Pt called stating he would like a referral to an orthopedic dr.

## 2020-01-16 NOTE — Telephone Encounter (Signed)
Outpatient Medication Detail   Disp Refills Start End   Blood Glucose Monitoring Suppl (FREESTYLE LITE) DEVI 1 each 0 01/16/2020    Sig - Route: 1 each by Does not apply route 4 (four) times daily. E11.65 - Does not apply   Sent to pharmacy as: Blood Glucose Monitoring Suppl (FREESTYLE LITE) Device   E-Prescribing Status: Receipt confirmed by pharmacy (01/16/2020  8:55 AM EDT)    Outpatient Medication Detail   Disp Refills Start End   glucose blood (FREESTYLE LITE) test strip 360 each 0 01/16/2020    Sig - Route: 1 each by Other route 4 (four) times daily. E11.65 - Other   Sent to pharmacy as: glucose blood (FREESTYLE LITE) test strip   E-Prescribing Status: Receipt confirmed by pharmacy (01/16/2020  8:55 AM EDT)    Outpatient Medication Detail   Disp Refills Start End   Lancets (FREESTYLE) lancets 360 each 0 01/16/2020    Sig - Route: 1 each by Other route 4 (four) times daily. E11.65 - Other   Sent to pharmacy as: Lancets (FREESTYLE) lancets   E-Prescribing Status: Receipt confirmed by pharmacy (01/16/2020  8:55 AM EDT)

## 2020-01-16 NOTE — Telephone Encounter (Signed)
Disk of thoracic and lumber xrays ready for pick up :)

## 2020-01-17 ENCOUNTER — Ambulatory Visit: Payer: PPO

## 2020-01-17 ENCOUNTER — Telehealth: Payer: Self-pay

## 2020-01-17 NOTE — Telephone Encounter (Signed)
Called patient to complete his Medicare wellness visit. Patient stated that he was in a restaurant eating and could not complete the call at the present time. Appointment was cancelled and he will call back at a later date to reschedule.

## 2020-01-20 DIAGNOSIS — M545 Low back pain: Secondary | ICD-10-CM | POA: Diagnosis not present

## 2020-01-30 DIAGNOSIS — M545 Low back pain: Secondary | ICD-10-CM | POA: Diagnosis not present

## 2020-02-08 ENCOUNTER — Other Ambulatory Visit: Payer: Self-pay

## 2020-02-08 DIAGNOSIS — M545 Low back pain: Secondary | ICD-10-CM | POA: Diagnosis not present

## 2020-02-10 ENCOUNTER — Encounter: Payer: Self-pay | Admitting: Endocrinology

## 2020-02-10 ENCOUNTER — Ambulatory Visit: Payer: PPO | Admitting: Endocrinology

## 2020-02-10 ENCOUNTER — Other Ambulatory Visit: Payer: Self-pay

## 2020-02-10 VITALS — BP 126/70 | HR 75 | Ht 70.0 in | Wt 212.6 lb

## 2020-02-10 DIAGNOSIS — E1165 Type 2 diabetes mellitus with hyperglycemia: Secondary | ICD-10-CM

## 2020-02-10 LAB — POCT GLYCOSYLATED HEMOGLOBIN (HGB A1C): Hemoglobin A1C: 6.3 % — AB (ref 4.0–5.6)

## 2020-02-10 NOTE — Patient Instructions (Addendum)
Please continue the same medications.   If you go back on steroids, check your blood sugar and call if it is over 200.   Please come back for a follow-up appointment in 6 months.

## 2020-02-10 NOTE — Progress Notes (Signed)
Subjective:    Patient ID: Kurt Aguilar, male    DOB: August 05, 1946, 74 y.o.   MRN: [id number]  HPI Pt returns for f/u of diabetes mellitus: DM type: 2   Dx'ed: 1941 Complications: polyneuropathy, stage 3a CRI, and CAD Therapy: Invokana DKA: never.  Severe hypoglycemia: never.  Pancreatitis: never.  Other: he takes intermitt prednisone for BOOP; renal insuff limits rx options; he took insulin 2018-2019.   Interval history: main symptom is back pain, but no recent steroids.  He says cbg's are well-controlled.  Past Medical History:  Diagnosis Date  . Arthritis   . Atypical migraine   . Complication of anesthesia    constipation and inability to urinate  . Diabetes ([place])   . Diverticulitis   . GERD (gastroesophageal reflux disease)   . Gout   . Heart attack ([place])   . Heart disease   . Hyperglycemia   . Hyperlipidemia   . Hypertension   . Osteoporosis   . Sleep apnea     Past Surgical History:  Procedure Laterality Date  . ACROMIO-CLAVICULAR JOINT REPAIR Left 05/05/2019   Procedure: SHOULDER ARTHROSCOPIC ASSISTED ACROMIO-CLAVICULAR JOINT REPAIR;  Surgeon: Tania Ade, MD;  Location: [facility];  Service: Orthopedics;  Laterality: Left;  . CATARACT EXTRACTION Bilateral   . CORONARY ARTERY BYPASS GRAFT     [place]  . open heart surgery  09/09/1995-1998   5 bypass  . REPAIR KNEE LIGAMENT     right  . SHOULDER ARTHROSCOPY Left 05/05/2019   Procedure: ARTHROSCOPY SHOULDER;  Surgeon: Tania Ade, MD;  Location: [facility];  Service: Orthopedics;  Laterality: Left;  . TONSILLECTOMY      Social History   Socioeconomic History  . Marital status: Married    Spouse name: Not on file  . Number of children: 0  . Years of education: Not on file  . Highest education level: Not on file  Occupational History  . Occupation: Armed forces technical officer: RETIRED  Tobacco Use  . Smoking status: Former Smoker    Packs/day: 1.00    Years: 3.00    Pack years: 3.00     Types: Cigars    Quit date: 11/25/2004    Years since quitting: 15.2  . Smokeless tobacco: Never Used  . Tobacco comment: occas. cigar quit 2006  Substance and Sexual Activity  . Alcohol use: Yes    Alcohol/week: 1.0 standard drinks    Types: 1 Standard drinks or equivalent per week    Comment: rare  . Drug use: No  . Sexual activity: Never  Other Topics Concern  . Not on file  Social History Narrative  . Not on file   Social Determinants of Health   Financial Resource Strain:   . Difficulty of Paying Living Expenses:   Food Insecurity:   . Worried About Charity fundraiser in the Last Year:   . Arboriculturist in the Last Year:   Transportation Needs:   . Film/video editor (Medical):   Marland Kitchen Lack of Transportation (Non-Medical):   Physical Activity:   . Days of Exercise per Week:   . Minutes of Exercise per Session:   Stress:   . Feeling of Stress :   Social Connections:   . Frequency of Communication with Friends and Family:   . Frequency of Social Gatherings with Friends and Family:   . Attends Religious Services:   . Active Member of Clubs or Organizations:   . Attends Club  or Organization Meetings:   Marland Kitchen Marital Status:   Intimate Partner Violence:   . Fear of Current or Ex-Partner:   . Emotionally Abused:   Marland Kitchen Physically Abused:   . Sexually Abused:     Current Outpatient Medications on File Prior to Visit  Medication Sig Dispense Refill  . allopurinol (ZYLOPRIM) 100 MG tablet TAKE 1 TABLET BY MOUTH TWICE A DAY 180 tablet 1  . aspirin 81 MG tablet Take 81 mg by mouth daily.     . Blood Glucose Monitoring Suppl (FREESTYLE LITE) DEVI 1 each by Does not apply route 4 (four) times daily. E11.65 1 each 0  . canagliflozin (INVOKANA) 100 MG TABS tablet Take 1 tablet (100 mg total) by mouth every other day. (Patient taking differently: Take 50 mg by mouth daily. ) 30 tablet 5  . colchicine 0.6 MG tablet TAKE 1 TABLET BY MOUTH TWICE A DAY WITH A MEAL AS NEEDED FOR  ACUTE FLARE OF GOUT 45 tablet 3  . Dextromethorphan-guaiFENesin (MUCINEX DM MAXIMUM STRENGTH) 60-1200 MG TB12 Take 1 tablet by mouth 2 (two) times daily.    Marland Kitchen ezetimibe (ZETIA) 10 MG tablet Take 1 tablet (10 mg total) by mouth daily. 90 tablet 3  . famotidine (PEPCID) 20 MG tablet Take 1 tablet (20 mg total) by mouth at bedtime. 30 tablet 3  . furosemide (LASIX) 20 MG tablet take 1 tablet by mouth once daily if needed (Patient taking differently: Take 20 mg by mouth daily as needed for fluid. ) 30 tablet 3  . Glucosamine HCl (GLUCOSAMINE PO) Take 1,500 mg by mouth 2 (two) times daily. Reported on 12/19/2015    . glucose blood (FREESTYLE LITE) test strip 1 each by Other route 4 (four) times daily. E11.65 360 each 0  . HYDROcodone-acetaminophen (NORCO) 5-325 MG tablet Take 1-2 tablets by mouth every 6 (six) hours as needed for moderate pain or severe pain. With food 40 tablet 0  . Lancets (FREESTYLE) lancets 1 each by Other route 4 (four) times daily. E11.65 360 each 0  . Lutein 20 MG CAPS Take 1 capsule by mouth daily. Reported on 12/19/2015    . methocarbamol (ROBAXIN) 500 MG tablet Take 1 tablet (500 mg total) by mouth every 8 (eight) hours as needed for muscle spasms. Caution of sedation 30 tablet 1  . metoprolol tartrate (LOPRESSOR) 25 MG tablet Take 12.5 mg by mouth daily.    . mupirocin ointment (BACTROBAN) 2 % Apply 1 application topically 2 (two) times daily. To affected area 15 g 0  . omeprazole (PRILOSEC) 20 MG capsule Take 2 capsules (40 mg total) by mouth daily before breakfast. 60 capsule 3  . ondansetron (ZOFRAN) 8 MG tablet Take 1 tablet (8 mg total) by mouth every 8 (eight) hours as needed for nausea or vomiting. From narcotic pain medicine 15 tablet 0  . rosuvastatin (CRESTOR) 40 MG tablet Take 1 tablet (40 mg total) by mouth daily. 90 tablet 3   Current Facility-Administered Medications on File Prior to Visit  Medication Dose Route Frequency Provider Last Rate Last Admin  .  betamethasone acetate-betamethasone sodium phosphate (CELESTONE) injection 3 mg  3 mg Intramuscular Once Edrick Kins, DPM        Allergies  Allergen Reactions  . Oxycodone-Acetaminophen Other (See Comments)    Stops breathing    Family History  Problem Relation Age of Onset  . Heart disease Mother   . Heart disease Father   . Stroke Father   . Heart  attack Son 21  . Kidney disease Neg Hx   . Prostate cancer Neg Hx   . Diabetes Neg Hx     BP 126/70 (BP Location: Left Arm, Patient Position: Sitting, Cuff Size: Normal)   Pulse 75   Ht 5\' 10"  (1.778 m)   Wt 212 lb 9.6 oz (96.4 kg)   SpO2 97%   BMI 30.50 kg/m    Review of Systems He denies hypoglycemia    Objective:   Physical Exam VITAL SIGNS:  See vs page GENERAL: no distress Pulses: dorsalis pedis intact bilat.   MSK: no deformity of the feet.   CV: trace bilat leg edema.   Skin:  no ulcer on the feet.  normal color and temp on the feet.  Old healed surgical scars on both legs (vein harvest) Neuro: sensation is intact to touch on the feet.    Lab Results  Component Value Date   CREATININE 1.43 (H) 05/05/2019   BUN 26 (H) 05/05/2019   NA 137 05/05/2019   K 4.5 05/05/2019   CL 100 05/05/2019   CO2 25 05/05/2019   Lab Results  Component Value Date   HGBA1C 6.3 (A) 02/10/2020       Assessment & Plan:  Type 2 DM, with CRI: well-controlled  Patient Instructions  Please continue the same medications.   If you go back on steroids, check your blood sugar and call if it is over 200.   Please come back for a follow-up appointment in 6 months.

## 2020-02-16 ENCOUNTER — Other Ambulatory Visit: Payer: Self-pay | Admitting: Internal Medicine

## 2020-03-08 ENCOUNTER — Other Ambulatory Visit: Payer: Self-pay | Admitting: Internal Medicine

## 2020-03-08 MED ORDER — OMEPRAZOLE 20 MG PO CPDR: 40.0000 mg | DELAYED_RELEASE_CAPSULE | Freq: Every day | ORAL | 3 refills | Status: DC

## 2020-03-10 ENCOUNTER — Other Ambulatory Visit: Payer: Self-pay | Admitting: Family Medicine

## 2020-03-29 DIAGNOSIS — L819 Disorder of pigmentation, unspecified: Secondary | ICD-10-CM | POA: Diagnosis not present

## 2020-03-29 DIAGNOSIS — L814 Other melanin hyperpigmentation: Secondary | ICD-10-CM | POA: Diagnosis not present

## 2020-03-29 DIAGNOSIS — L821 Other seborrheic keratosis: Secondary | ICD-10-CM | POA: Diagnosis not present

## 2020-03-29 DIAGNOSIS — L570 Actinic keratosis: Secondary | ICD-10-CM | POA: Diagnosis not present

## 2020-03-29 DIAGNOSIS — D229 Melanocytic nevi, unspecified: Secondary | ICD-10-CM | POA: Diagnosis not present

## 2020-04-08 ENCOUNTER — Telehealth: Payer: Self-pay | Admitting: Family Medicine

## 2020-04-08 DIAGNOSIS — N289 Disorder of kidney and ureter, unspecified: Secondary | ICD-10-CM

## 2020-04-08 DIAGNOSIS — E1165 Type 2 diabetes mellitus with hyperglycemia: Secondary | ICD-10-CM

## 2020-04-08 DIAGNOSIS — N401 Enlarged prostate with lower urinary tract symptoms: Secondary | ICD-10-CM

## 2020-04-08 DIAGNOSIS — I10 Essential (primary) hypertension: Secondary | ICD-10-CM

## 2020-04-08 DIAGNOSIS — N138 Other obstructive and reflux uropathy: Secondary | ICD-10-CM

## 2020-04-08 NOTE — Telephone Encounter (Signed)
-----   Message from Ellamae Sia sent at 03/28/2020  8:10 AM EDT ----- Regarding: Lab orders for Monday, 04-09-20  AWV lab orders, please.

## 2020-04-09 ENCOUNTER — Ambulatory Visit (INDEPENDENT_AMBULATORY_CARE_PROVIDER_SITE_OTHER): Payer: PPO

## 2020-04-09 ENCOUNTER — Other Ambulatory Visit: Payer: Self-pay

## 2020-04-09 ENCOUNTER — Other Ambulatory Visit (INDEPENDENT_AMBULATORY_CARE_PROVIDER_SITE_OTHER): Payer: PPO

## 2020-04-09 DIAGNOSIS — I10 Essential (primary) hypertension: Secondary | ICD-10-CM

## 2020-04-09 DIAGNOSIS — N401 Enlarged prostate with lower urinary tract symptoms: Secondary | ICD-10-CM | POA: Diagnosis not present

## 2020-04-09 DIAGNOSIS — Z0000 Encounter for general adult medical examination without abnormal findings: Secondary | ICD-10-CM | POA: Diagnosis not present

## 2020-04-09 DIAGNOSIS — N289 Disorder of kidney and ureter, unspecified: Secondary | ICD-10-CM | POA: Diagnosis not present

## 2020-04-09 DIAGNOSIS — N138 Other obstructive and reflux uropathy: Secondary | ICD-10-CM

## 2020-04-09 LAB — CBC WITH DIFFERENTIAL/PLATELET
Basophils Absolute: 0.1 10*3/uL (ref 0.0–0.1)
Basophils Relative: 0.7 % (ref 0.0–3.0)
Eosinophils Absolute: 0.3 10*3/uL (ref 0.0–0.7)
Eosinophils Relative: 4 % (ref 0.0–5.0)
HCT: 42.8 % (ref 39.0–52.0)
Hemoglobin: 14.3 g/dL (ref 13.0–17.0)
Lymphocytes Relative: 32.4 % (ref 12.0–46.0)
Lymphs Abs: 2.7 10*3/uL (ref 0.7–4.0)
MCHC: 33.4 g/dL (ref 30.0–36.0)
MCV: 95.4 fl (ref 78.0–100.0)
Monocytes Absolute: 0.6 10*3/uL (ref 0.1–1.0)
Monocytes Relative: 7.5 % (ref 3.0–12.0)
Neutro Abs: 4.6 10*3/uL (ref 1.4–7.7)
Neutrophils Relative %: 55.4 % (ref 43.0–77.0)
Platelets: 232 10*3/uL (ref 150.0–400.0)
RBC: 4.49 Mil/uL (ref 4.22–5.81)
RDW: 13.4 % (ref 11.5–15.5)
WBC: 8.3 10*3/uL (ref 4.0–10.5)

## 2020-04-09 LAB — COMPREHENSIVE METABOLIC PANEL
ALT: 18 U/L (ref 0–53)
AST: 20 U/L (ref 0–37)
Albumin: 4.4 g/dL (ref 3.5–5.2)
Alkaline Phosphatase: 55 U/L (ref 39–117)
BUN: 32 mg/dL — ABNORMAL HIGH (ref 6–23)
CO2: 29 mEq/L (ref 19–32)
Calcium: 9.5 mg/dL (ref 8.4–10.5)
Chloride: 104 mEq/L (ref 96–112)
Creatinine, Ser: 1.87 mg/dL — ABNORMAL HIGH (ref 0.40–1.50)
GFR: 35.23 mL/min — ABNORMAL LOW (ref >60.00)
Glucose, Bld: 132 mg/dL — ABNORMAL HIGH (ref 70–99)
Potassium: 4.7 mEq/L (ref 3.5–5.1)
Sodium: 139 mEq/L (ref 135–145)
Total Bilirubin: 0.6 mg/dL (ref 0.2–1.2)
Total Protein: 6.8 g/dL (ref 6.0–8.3)

## 2020-04-09 LAB — LIPID PANEL
Cholesterol: 131 mg/dL (ref 0–200)
HDL: 46.2 mg/dL (ref >39.00)
NonHDL: 84.75
Total CHOL/HDL Ratio: 3
Triglycerides: 206 mg/dL — ABNORMAL HIGH (ref 0.0–149.0)
VLDL: 41.2 mg/dL — ABNORMAL HIGH (ref 0.0–40.0)

## 2020-04-09 LAB — LDL CHOLESTEROL, DIRECT: Direct LDL: 59 mg/dL

## 2020-04-09 LAB — PSA, MEDICARE: PSA: 2.32 ng/ml (ref 0.10–4.00)

## 2020-04-09 LAB — TSH: TSH: 3.54 u[IU]/mL (ref 0.35–4.50)
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
## 2020-04-09 NOTE — Progress Notes (Signed)
PCP notes:  Health Maintenance: Foot exam- due Flu- due   Abnormal Screenings: none   Patient concerns: none   Nurse concerns: none   Next PCP appt.: 04/11/2020 @ 10:45 am

## 2020-04-09 NOTE — Patient Instructions (Signed)
Kurt Aguilar , Thank you for taking time to come for your Medicare Wellness Visit. I appreciate your ongoing commitment to your health goals. Please review the following plan we discussed and let me know if I can assist you in the future.   Screening recommendations/referrals: Colonoscopy: Up to date, completed 05/01/2017, no longer required Recommended yearly ophthalmology/optometry visit for glaucoma screening and checkup Recommended yearly dental visit for hygiene and checkup  Vaccinations: Influenza vaccine: due, available in office at the end of the month  Pneumococcal vaccine: Completed series Tdap vaccine: Up to date, completed 02/12/2016, due 02/2026 Shingles vaccine: Completed series   Covid-19: Completed series  Advanced directives: copy in chart  Conditions/risks identified: Diabetes  Next appointment: Follow up in one year for your annual wellness visit.   Preventive Care 57 Years and Older, Male Preventive care refers to lifestyle choices and visits with your health care provider that can promote health and wellness. What does preventive care include?  A yearly physical exam. This is also called an annual well check.  Dental exams once or twice a year.  Routine eye exams. Ask your health care provider how often you should have your eyes checked.  Personal lifestyle choices, including:  Daily care of your teeth and gums.  Regular physical activity.  Eating a healthy diet.  Avoiding tobacco and drug use.  Limiting alcohol use.  Practicing safe sex.  Taking low doses of aspirin every day.  Taking vitamin and mineral supplements as recommended by your health care provider. What happens during an annual well check? The services and screenings done by your health care provider during your annual well check will depend on your age, overall health, lifestyle risk factors, and family history of disease. Counseling  Your health care provider may ask you questions  about your:  Alcohol use.  Tobacco use.  Drug use.  Emotional well-being.  Home and relationship well-being.  Sexual activity.  Eating habits.  History of falls.  Memory and ability to understand (cognition).  Work and work Statistician. Screening  You may have the following tests or measurements:  Height, weight, and BMI.  Blood pressure.  Lipid and cholesterol levels. These may be checked every 5 years, or more frequently if you are over 67 years old.  Skin check.  Lung cancer screening. You may have this screening every year starting at age 71 if you have a 30-pack-year history of smoking and currently smoke or have quit within the past 15 years.  Fecal occult blood test (FOBT) of the stool. You may have this test every year starting at age 31.  Flexible sigmoidoscopy or colonoscopy. You may have a sigmoidoscopy every 5 years or a colonoscopy every 10 years starting at age 28.  Prostate cancer screening. Recommendations will vary depending on your family history and other risks.  Hepatitis C blood test.  Hepatitis B blood test.  Sexually transmitted disease (STD) testing.  Diabetes screening. This is done by checking your blood sugar (glucose) after you have not eaten for a while (fasting). You may have this done every 1-3 years.  Abdominal aortic aneurysm (AAA) screening. You may need this if you are a current or former smoker.  Osteoporosis. You may be screened starting at age 53 if you are at high risk. Talk with your health care provider about your test results, treatment options, and if necessary, the need for more tests. Vaccines  Your health care provider may recommend certain vaccines, such as:  Influenza vaccine. This  is recommended every year.  Tetanus, diphtheria, and acellular pertussis (Tdap, Td) vaccine. You may need a Td booster every 10 years.  Zoster vaccine. You may need this after age 23.  Pneumococcal 13-valent conjugate (PCV13)  vaccine. One dose is recommended after age 79.  Pneumococcal polysaccharide (PPSV23) vaccine. One dose is recommended after age 48. Talk to your health care provider about which screenings and vaccines you need and how often you need them. This information is not intended to replace advice given to you by your health care provider. Make sure you discuss any questions you have with your health care provider. Document Released: 09/21/2015 Document Revised: 05/14/2016 Document Reviewed: 06/26/2015 Elsevier Interactive Patient Education  2017 [place] Prevention in the Home Falls can cause injuries. They can happen to people of all ages. There are many things you can do to make your home safe and to help prevent falls. What can I do on the outside of my home?  Regularly fix the edges of walkways and driveways and fix any cracks.  Remove anything that might make you trip as you walk through a door, such as a raised step or threshold.  Trim any bushes or trees on the path to your home.  Use bright outdoor lighting.  Clear any walking paths of anything that might make someone trip, such as rocks or tools.  Regularly check to see if handrails are loose or broken. Make sure that both sides of any steps have handrails.  Any raised decks and porches should have guardrails on the edges.  Have any leaves, snow, or ice cleared regularly.  Use sand or salt on walking paths during winter.  Clean up any spills in your garage right away. This includes oil or grease spills. What can I do in the bathroom?  Use night lights.  Install grab bars by the toilet and in the tub and shower. Do not use towel bars as grab bars.  Use non-skid mats or decals in the tub or shower.  If you need to sit down in the shower, use a plastic, non-slip stool.  Keep the floor dry. Clean up any water that spills on the floor as soon as it happens.  Remove soap buildup in the tub or shower  regularly.  Attach bath mats securely with double-sided non-slip rug tape.  Do not have throw rugs and other things on the floor that can make you trip. What can I do in the bedroom?  Use night lights.  Make sure that you have a light by your bed that is easy to reach.  Do not use any sheets or blankets that are too big for your bed. They should not hang down onto the floor.  Have a firm chair that has side arms. You can use this for support while you get dressed.  Do not have throw rugs and other things on the floor that can make you trip. What can I do in the kitchen?  Clean up any spills right away.  Avoid walking on wet floors.  Keep items that you use a lot in easy-to-reach places.  If you need to reach something above you, use a strong step stool that has a grab bar.  Keep electrical cords out of the way.  Do not use floor polish or wax that makes floors slippery. If you must use wax, use non-skid floor wax.  Do not have throw rugs and other things on the floor that can make you  trip. What can I do with my stairs?  Do not leave any items on the stairs.  Make sure that there are handrails on both sides of the stairs and use them. Fix handrails that are broken or loose. Make sure that handrails are as long as the stairways.  Check any carpeting to make sure that it is firmly attached to the stairs. Fix any carpet that is loose or worn.  Avoid having throw rugs at the top or bottom of the stairs. If you do have throw rugs, attach them to the floor with carpet tape.  Make sure that you have a light switch at the top of the stairs and the bottom of the stairs. If you do not have them, ask someone to add them for you. What else can I do to help prevent falls?  Wear shoes that:  Do not have high heels.  Have rubber bottoms.  Are comfortable and fit you well.  Are closed at the toe. Do not wear sandals.  If you use a stepladder:  Make sure that it is fully  opened. Do not climb a closed stepladder.  Make sure that both sides of the stepladder are locked into place.  Ask someone to hold it for you, if possible.  Clearly mark and make sure that you can see:  Any grab bars or handrails.  First and last steps.  Where the edge of each step is.  Use tools that help you move around (mobility aids) if they are needed. These include:  Canes.  Walkers.  Scooters.  Crutches.  Turn on the lights when you go into a dark area. Replace any light bulbs as soon as they burn out.  Set up your furniture so you have a clear path. Avoid moving your furniture around.  If any of your floors are uneven, fix them.  If there are any pets around you, be aware of where they are.  Review your medicines with your doctor. Some medicines can make you feel dizzy. This can increase your chance of falling. Ask your doctor what other things that you can do to help prevent falls. This information is not intended to replace advice given to you by your health care provider. Make sure you discuss any questions you have with your health care provider. Document Released: 06/21/2009 Document Revised: 01/31/2016 Document Reviewed: 09/29/2014 Elsevier Interactive Patient Education  2017 [employer].

## 2020-04-10 ENCOUNTER — Other Ambulatory Visit: Payer: Self-pay | Admitting: Endocrinology

## 2020-04-10 DIAGNOSIS — E1165 Type 2 diabetes mellitus with hyperglycemia: Secondary | ICD-10-CM

## 2020-04-11 ENCOUNTER — Encounter: Payer: Self-pay | Admitting: Family Medicine

## 2020-04-11 ENCOUNTER — Other Ambulatory Visit: Payer: Self-pay

## 2020-04-11 ENCOUNTER — Ambulatory Visit (INDEPENDENT_AMBULATORY_CARE_PROVIDER_SITE_OTHER): Payer: PPO | Admitting: Family Medicine

## 2020-04-11 VITALS — BP 126/78 | HR 61 | Temp 97.1°F | Ht 70.0 in | Wt 217.5 lb

## 2020-04-11 DIAGNOSIS — N401 Enlarged prostate with lower urinary tract symptoms: Secondary | ICD-10-CM | POA: Diagnosis not present

## 2020-04-11 DIAGNOSIS — N289 Disorder of kidney and ureter, unspecified: Secondary | ICD-10-CM

## 2020-04-11 DIAGNOSIS — E785 Hyperlipidemia, unspecified: Secondary | ICD-10-CM

## 2020-04-11 DIAGNOSIS — N138 Other obstructive and reflux uropathy: Secondary | ICD-10-CM

## 2020-04-11 DIAGNOSIS — E669 Obesity, unspecified: Secondary | ICD-10-CM

## 2020-04-11 DIAGNOSIS — I25708 Atherosclerosis of coronary artery bypass graft(s), unspecified, with other forms of angina pectoris: Secondary | ICD-10-CM

## 2020-04-11 DIAGNOSIS — E1169 Type 2 diabetes mellitus with other specified complication: Secondary | ICD-10-CM | POA: Diagnosis not present

## 2020-04-11 DIAGNOSIS — E1165 Type 2 diabetes mellitus with hyperglycemia: Secondary | ICD-10-CM

## 2020-04-11 DIAGNOSIS — Z0000 Encounter for general adult medical examination without abnormal findings: Secondary | ICD-10-CM

## 2020-04-11 DIAGNOSIS — I10 Essential (primary) hypertension: Secondary | ICD-10-CM

## 2020-04-11 MED ORDER — ROSUVASTATIN CALCIUM 40 MG PO TABS: 40.0000 mg | ORAL_TABLET | Freq: Every day | ORAL | 3 refills | Status: DC

## 2020-04-11 MED ORDER — EZETIMIBE 10 MG PO TABS: 10.0000 mg | ORAL_TABLET | Freq: Every day | ORAL | 3 refills | Status: DC
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
## 2020-04-11 NOTE — Assessment & Plan Note (Signed)
bp in fair control at this time  BP Readings from Last 1 Encounters:  04/11/20 126/78   No changes needed Most recent labs reviewed  Disc lifstyle change with low sodium diet and exercise

## 2020-04-11 NOTE — Assessment & Plan Note (Signed)
Reviewed health habits including diet and exercise and skin cancer prevention Reviewed appropriate screening tests for age  Also reviewed health mt list, fam hx and immunization status , as well as social and family history   Reviewed amw Reviewed labs utd vaccines incl covid and shingrix psa up 1 pt in setting of BPH-will continue to follow

## 2020-04-11 NOTE — Assessment & Plan Note (Signed)
Discussed how this problem influences overall health and the risks it imposes  Reviewed plan for weight loss with lower calorie diet (via better food choices and also portion control or program like weight watchers) and exercise building up to or more than 30 minutes 5 days per week including some aerobic activity    

## 2020-04-11 NOTE — Patient Instructions (Addendum)
Try to maintain 64 oz fluids daily -- mostly  Especially in hot weather and when active   Use sunscreen   Stay active   Be cautious of falls   Chair yoga is great for balance

## 2020-04-11 NOTE — Assessment & Plan Note (Signed)
Disc goals for lipids and reasons to control them Rev last labs with pt Rev low sat fat diet in detail HDL is up and LDL down  Trig up (after vacation) Enc to continue crestor and diet

## 2020-04-11 NOTE — Assessment & Plan Note (Signed)
Cr up in our labs to 1.87  Has been on vacation/likely less fluids Set up plan to drink more water F/u nephrology in fall as expected

## 2020-04-11 NOTE — Assessment & Plan Note (Signed)
Pt sees endocrinology Recently able to cut invokana dose  Doing well  Enc further wt loss

## 2020-04-11 NOTE — Assessment & Plan Note (Signed)
No clinical changes Lab Results  Component Value Date   PSA 2.32 04/09/2020   PSA 1.33 03/24/2019   PSA 1.15 03/08/2018   Will continue to watch  1 point increase  Disc s/s to watch for

## 2020-04-11 NOTE — Assessment & Plan Note (Signed)
utd cardiac f/u  No clinical changes

## 2020-04-16 NOTE — Progress Notes (Signed)
Cardiology Office Note  Date:  04/17/2020   ID:  Kurt Aguilar, DOB 24-Dec-1945, MRN [id number]  PCP:  [name], MD   Chief Complaint  Patient presents with  . office visit    12 month F/U; Meds verbally reviewed with patient.    HPI:  74 yo with history of  CAD,  CABG back in 90 in [place], [place],  HTN,  Diabetes with chronic renal insufficiency Previous A1c 11 now down to 6 hyperlipidemia,   gout.  baseline creatinine mildly elevated He lost his son in early 2016 to cardiac arrest Aortic valve murmur/aortic valve disease h/o reactive airway symptoms, Boop, 2017 presents for routine followup of his coronary artery disease.  Reports doing relatively well Denies any chest pain concerning for angina, no significant shortness of breath  Using exercise facility at [facility] weight up from recent injuries Now lifting weights, swimming in the pool  Weight up 10 pounds past year  BP elevated today, long walk into office At home relatively well controlled No significant leg edema  Lab work reviewed HBA1c 6.3 LDL 45  Followed by nephrology, Candiss Norse On Invokana 1/2 pill  EKG personally reviewed by myself on todays visit Shows normal sinus rhythm rate 68 bpm right bundle branch block no other significant ST-T wave changes  Other past medical history reviewed Issue in [place] in 10/2016 "thought I was having a stroke" Was seen in the emergency room for confusion BMP performed showing creatinine 1.69, BUN 45 Chlorthalidone was held Repeat BMP early March 2018 showing creatinine 1.69 Baseline creatinine typically 1.4 For some reason he has continued his potassium  h/o reactive airway symptoms, Boop PreviouslyStarted on prednisoneMarch 2017  for shortness of breath, improvement of symptoms,  cough returned after prednisone was discontinued getting close To his baseline Minimal wheezing and cough predisone 5 mg daily, Plan may be to wean the dose Managed by Dr.  [name] , Pulmonary  Other major issue was hemoglobin A1c going up to 11 Started On insulin, daily in Am    PMH:   has a past medical history of Arthritis, Atypical migraine, Complication of anesthesia, Diabetes ([place]), Diverticulitis, GERD (gastroesophageal reflux disease), Gout, Heart attack ([place]), Heart disease, Hyperglycemia, Hyperlipidemia, Hypertension, Osteoporosis, and Sleep apnea.  PSH:    Past Surgical History:  Procedure Laterality Date  . ACROMIO-CLAVICULAR JOINT REPAIR Left 05/05/2019   Procedure: SHOULDER ARTHROSCOPIC ASSISTED ACROMIO-CLAVICULAR JOINT REPAIR;  Surgeon: Tania Ade, MD;  Location: [facility];  Service: Orthopedics;  Laterality: Left;  . CATARACT EXTRACTION Bilateral   . CORONARY ARTERY BYPASS GRAFT     [place]  . open heart surgery  09/09/1995-1998   5 bypass  . REPAIR KNEE LIGAMENT     right  . SHOULDER ARTHROSCOPY Left 05/05/2019   Procedure: ARTHROSCOPY SHOULDER;  Surgeon: Tania Ade, MD;  Location: [facility];  Service: Orthopedics;  Laterality: Left;  . TONSILLECTOMY      Current Outpatient Medications  Medication Sig Dispense Refill  . allopurinol (ZYLOPRIM) 100 MG tablet TAKE 1 TABLET BY MOUTH TWICE A DAY 180 tablet 0  . aspirin 81 MG tablet Take 81 mg by mouth daily.     . Blood Glucose Monitoring Suppl (FREESTYLE LITE) DEVI 1 each by Does not apply route 4 (four) times daily. E11.65 1 each 0  . canagliflozin (INVOKANA) 100 MG TABS tablet Take 50 mg by mouth daily before breakfast.    . colchicine 0.6 MG tablet TAKE 1 TABLET BY MOUTH TWICE A DAY  WITH A MEAL AS NEEDED FOR ACUTE FLARE OF GOUT 45 tablet 3  . Dextromethorphan-guaiFENesin (MUCINEX DM MAXIMUM STRENGTH) 60-1200 MG TB12 Take 1 tablet by mouth 2 (two) times daily.    Marland Kitchen ezetimibe (ZETIA) 10 MG tablet Take 1 tablet (10 mg total) by mouth daily. 90 tablet 3  . famotidine (PEPCID) 20 MG tablet TAKE 1 TABLET BY MOUTH EVERYDAY AT BEDTIME 90 tablet 1  . Glucosamine HCl (GLUCOSAMINE PO)  Take 1,500 mg by mouth 2 (two) times daily. Reported on 12/19/2015    . glucose blood (FREESTYLE LITE) test strip 1 each by Other route 4 (four) times daily. E11.65 400 strip 0  . Lancets (FREESTYLE) lancets 1 each by Other route 4 (four) times daily. E11.65 360 each 0  . Lutein 20 MG CAPS Take 1 capsule by mouth daily. Reported on 12/19/2015    . metoprolol tartrate (LOPRESSOR) 25 MG tablet Take 0.5 tablets (12.5 mg total) by mouth daily. 45 tablet 3  . omeprazole (PRILOSEC) 20 MG capsule Take 2 capsules (40 mg total) by mouth daily before breakfast. 60 capsule 3  . ondansetron (ZOFRAN) 8 MG tablet Take 1 tablet (8 mg total) by mouth every 8 (eight) hours as needed for nausea or vomiting. From narcotic pain medicine 15 tablet 0  . rosuvastatin (CRESTOR) 40 MG tablet Take 1 tablet (40 mg total) by mouth daily. 90 tablet 3  . canagliflozin (INVOKANA) 100 MG TABS tablet Take 1 tablet (100 mg total) by mouth every other day. (Patient taking differently: Take 50 mg by mouth daily. ) 30 tablet 5  . HYDROcodone-acetaminophen (NORCO) 5-325 MG tablet Take 1-2 tablets by mouth every 6 (six) hours as needed for moderate pain or severe pain. With food 40 tablet 0  . methocarbamol (ROBAXIN) 500 MG tablet Take 1 tablet (500 mg total) by mouth every 8 (eight) hours as needed for muscle spasms. Caution of sedation 30 tablet 1  . mupirocin ointment (BACTROBAN) 2 % Apply 1 application topically 2 (two) times daily. To affected area 15 g 0   Current Facility-Administered Medications  Medication Dose Route Frequency Provider Last Rate Last Admin  . betamethasone acetate-betamethasone sodium phosphate (CELESTONE) injection 3 mg  3 mg Intramuscular Once Edrick Kins, DPM         Allergies:   Oxycodone-acetaminophen   Social History:  The patient  reports that he quit smoking about 15 years ago. His smoking use included cigars. He has a 3.00 pack-year smoking history. He has never used smokeless tobacco. He reports  current alcohol use of about 1.0 standard drink of alcohol per week. He reports that he does not use drugs.   Family History:   family history includes Heart attack (age of onset: 67) in his son; Heart disease in his father and mother; Stroke in his father.    Review of Systems: Review of Systems  Constitutional:       Weight gain  HENT: Negative.   Respiratory: Negative.   Cardiovascular: Negative.   Gastrointestinal: Negative.   Musculoskeletal: Negative.   Neurological: Negative.   Psychiatric/Behavioral: Negative.   All other systems reviewed and are negative.   PHYSICAL EXAM: VS:  BP (!) 150/84 (BP Location: Left Arm, Patient Position: Sitting, Cuff Size: Large)   Pulse 68   Ht 5\' 10"  (1.778 m)   Wt 219 lb 2 oz (99.4 kg)   SpO2 97%   BMI 31.44 kg/m  , BMI Body mass index is 31.44 kg/m.  Constitutional:  oriented to person, place, and time. No distress.  HENT:  Head: Grossly normal Eyes:  no discharge. No scleral icterus.  Neck: No JVD, no carotid bruits  Cardiovascular: Regular rate and rhythm, no murmurs appreciated Pulmonary/Chest: Clear to auscultation bilaterally, no wheezes or rails Abdominal: Soft.  no distension.  no tenderness.  Musculoskeletal: Normal range of motion Neurological:  normal muscle tone. Coordination normal. No atrophy Skin: Skin warm and dry Psychiatric: normal affect, pleasant   Recent Labs: 04/09/2020: ALT 18; BUN 32; Creatinine, Ser 1.87; Hemoglobin 14.3; Platelets 232.0; Potassium 4.7; Sodium 139; TSH 3.54    Lipid Panel Lab Results  Component Value Date   CHOL 131 04/09/2020   HDL 46.20 04/09/2020   LDLCALC 45 03/24/2019   TRIG 206.0 (H) 04/09/2020      Wt Readings from Last 3 Encounters:  04/17/20 219 lb 2 oz (99.4 kg)  04/11/20 217 lb 8 oz (98.7 kg)  02/10/20 212 lb 9.6 oz (96.4 kg)      ASSESSMENT AND PLAN:  Coronary artery disease with stable angina, bypass Currently with no symptoms of angina. No further workup at  this time. Continue current medication regimen.  Stable No further ischemic work-up at this time  Pure hypercholesterolemia - Plan: EKG 12-Lead Cholesterol is at goal on the current lipid regimen. No changes to the medications were made. Stable  Essential hypertension - Plan: EKG 12-Lead Blood pressure well controlled at home, elevated in the office today Will monitor for now Weight trending up  Chronic mild shortness of breath, stable. He has restarted his exercise program Will watch weight  Controlled type 2 diabetes mellitus with diabetic nephropathy, unspecified long term insulin use status (HCC) - Hemoglobin A1c of 6.3 Close to goal Restarted exercise  Morbid obesity due to excess calories ([place]) in setting of pred rx  -  We have encouraged continued exercise, careful diet management in an effort to lose weight.  Reactive airway disease Was previously on prednisone 2018 Off prednisone Stable   Total encounter time more than 25 minutes  Greater than 50% was spent in counseling and coordination of care with the patient      Orders Placed This Encounter  Procedures  . EKG 12-Lead     Signed, Esmond Plants, M.D., Ph.D. 04/17/2020  [place], [place]

## 2020-04-17 ENCOUNTER — Other Ambulatory Visit: Payer: Self-pay

## 2020-04-17 ENCOUNTER — Ambulatory Visit: Payer: PPO | Admitting: Cardiovascular Disease

## 2020-04-17 ENCOUNTER — Encounter: Payer: Self-pay | Admitting: Cardiovascular Disease

## 2020-04-17 VITALS — BP 150/84 | HR 68 | Ht 70.0 in | Wt 219.1 lb

## 2020-04-17 DIAGNOSIS — G4733 Obstructive sleep apnea (adult) (pediatric): Secondary | ICD-10-CM | POA: Diagnosis not present

## 2020-04-17 DIAGNOSIS — I25708 Atherosclerosis of coronary artery bypass graft(s), unspecified, with other forms of angina pectoris: Secondary | ICD-10-CM | POA: Diagnosis not present

## 2020-04-17 DIAGNOSIS — I10 Essential (primary) hypertension: Secondary | ICD-10-CM | POA: Diagnosis not present

## 2020-04-17 DIAGNOSIS — R0600 Dyspnea, unspecified: Secondary | ICD-10-CM | POA: Diagnosis not present

## 2020-04-17 DIAGNOSIS — R0609 Other forms of dyspnea: Secondary | ICD-10-CM

## 2020-04-17 DIAGNOSIS — E782 Mixed hyperlipidemia: Secondary | ICD-10-CM | POA: Diagnosis not present

## 2020-04-17 MED ORDER — METOPROLOL TARTRATE 25 MG PO TABS: 12.5000 mg | ORAL_TABLET | Freq: Every day | ORAL | 3 refills | Status: DC

## 2020-04-17 NOTE — Patient Instructions (Signed)

## 2020-04-23 ENCOUNTER — Other Ambulatory Visit: Payer: Self-pay | Admitting: Endocrinology

## 2020-04-23 DIAGNOSIS — E1165 Type 2 diabetes mellitus with hyperglycemia: Secondary | ICD-10-CM

## 2020-05-11 ENCOUNTER — Telehealth: Payer: Self-pay | Admitting: Cardiovascular Disease

## 2020-05-11 ENCOUNTER — Other Ambulatory Visit: Payer: Self-pay | Admitting: Orthopaedic Surgery

## 2020-05-11 DIAGNOSIS — M25512 Pain in left shoulder: Secondary | ICD-10-CM | POA: Diagnosis not present

## 2020-05-11 DIAGNOSIS — M1712 Unilateral primary osteoarthritis, left knee: Secondary | ICD-10-CM | POA: Diagnosis not present

## 2020-05-11 DIAGNOSIS — M1711 Unilateral primary osteoarthritis, right knee: Secondary | ICD-10-CM | POA: Diagnosis not present

## 2020-05-11 NOTE — Telephone Encounter (Signed)
° °  [facility] Pre-operative Risk Assessment    HEARTCARE STAFF: - Please ensure there is not already an duplicate clearance open for this procedure. - Under Visit Info/Reason for Call, type in Other and utilize the format Clearance MM/DD/YY or Clearance TBD. Do not use dashes or single digits. - If request is for dental extraction, please clarify the # of teeth to be extracted.  Request for surgical clearance:  1. What type of surgery is being performed? Left knee arthroplasty   2. When is this surgery scheduled? 05/29/20  3. What type of clearance is required (medical clearance vs. Pharmacy clearance to hold med vs. Both)? both  4. Are there any medications that need to be held prior to surgery and how long? Not listed, please provide if needed  5. Practice name and name of physician performing surgery? [facility] - Dr Monico Blitz. [place]  6. What is the office phone number? [phone]   7.   What is the office fax number? [phone] Attn: Marlin Canary  8.   Anesthesia type (None, local, MAC, general) ? spinal   Ace Gins 05/11/2020, 2:45 PM  _________________________________________________________________   (provider comments below)

## 2020-05-15 NOTE — Telephone Encounter (Signed)
   Primary Cardiologist: Dr [name]  Chart reviewed and patient contacted today by phone as part of pre-operative protocol coverage. Given past medical history and time since last visit, based on ACC/AHA guidelines, Kurt Aguilar would be at acceptable risk for the planned procedure without further cardiovascular testing.   Dr [name] would prefer the patient remain on Asprin 81 mg if possible.   The patient was advised that if he develops new symptoms prior to surgery to contact our office to arrange for a follow-up visit, and he verbalized understanding.  I will route this recommendation to the requesting party via Epic fax function and remove from pre-op pool.  Please call with questions.  Kerin Ransom, PA-C 05/15/2020, 8:41 AM

## 2020-05-17 ENCOUNTER — Encounter (HOSPITAL_COMMUNITY): Payer: Self-pay

## 2020-05-17 ENCOUNTER — Encounter (HOSPITAL_COMMUNITY)
Admission: RE | Admit: 2020-05-17 | Discharge: 2020-05-17 | Disposition: A | Discharge status: Home / Self Care | Payer: PPO | Source: Ambulatory Visit | Attending: Orthopaedic Surgery | Admitting: Orthopaedic Surgery

## 2020-05-17 ENCOUNTER — Other Ambulatory Visit: Payer: Self-pay

## 2020-05-17 DIAGNOSIS — Z01812 Encounter for preprocedural laboratory examination: Secondary | ICD-10-CM | POA: Diagnosis not present

## 2020-05-17 HISTORY — DX: Pneumonia, unspecified organism: J18.9

## 2020-05-17 HISTORY — DX: Chronic kidney disease, unspecified: N18.9

## 2020-05-17 HISTORY — DX: Cardiac murmur, unspecified: R01.1

## 2020-05-17 LAB — SURGICAL PCR SCREEN
MRSA, PCR: NEGATIVE
Staphylococcus aureus: NEGATIVE

## 2020-05-17 LAB — HEMOGLOBIN A1C
Hgb A1c MFr Bld: 6.6 % — ABNORMAL HIGH (ref 4.8–5.6)
Mean Plasma Glucose: 142.72 mg/dL

## 2020-05-17 LAB — CBC
HCT: 46.3 % (ref 39.0–52.0)
Hemoglobin: 15.4 g/dL (ref 13.0–17.0)
MCH: 32.3 pg (ref 26.0–34.0)
MCHC: 33.3 g/dL (ref 30.0–36.0)
MCV: 97.1 fL (ref 80.0–100.0)
Platelets: 262 10*3/uL (ref 150–400)
RBC: 4.77 MIL/uL (ref 4.22–5.81)
RDW: 13.2 % (ref 11.5–15.5)
WBC: 9.7 10*3/uL (ref 4.0–10.5)
nRBC: 0 % (ref 0.0–0.2)

## 2020-05-17 LAB — BASIC METABOLIC PANEL
Anion gap: 12 (ref 5–15)
BUN: 40 mg/dL — ABNORMAL HIGH (ref 8–23)
CO2: 25 mmol/L (ref 22–32)
Calcium: 9.3 mg/dL (ref 8.9–10.3)
Chloride: 104 mmol/L (ref 98–111)
Creatinine, Ser: 1.89 mg/dL — ABNORMAL HIGH (ref 0.61–1.24)
GFR calc Af Amer: 39 mL/min — ABNORMAL LOW (ref >60)
GFR calc non Af Amer: 34 mL/min — ABNORMAL LOW (ref >60)
Glucose, Bld: 193 mg/dL — ABNORMAL HIGH (ref 70–99)
Potassium: 4.6 mmol/L (ref 3.5–5.1)
Sodium: 141 mmol/L (ref 135–145)

## 2020-05-17 NOTE — Progress Notes (Addendum)
DUE TO COVID-19 ONLY ONE VISITOR IS ALLOWED TO COME WITH YOU AND STAY IN THE WAITING ROOM ONLY DURING PRE OP AND PROCEDURE DAY OF SURGERY. THE 1 VISITOR  MAY VISIT WITH YOU AFTER SURGERY IN YOUR PRIVATE ROOM DURING VISITING HOURS ONLY!  YOU NEED TO HAVE A COVID 19 TEST ON___9/17/2021 ____ @_______ , THIS TEST MUST BE DONE BEFORE SURGERY,  COVID TESTING SITE [address] [place] [address], IT IS ON THE RIGHT GOING OUT [address] APPROXIMATELY  2 MINUTES PAST [employer] ON THE RIGHT. ONCE YOUR COVID TEST IS COMPLETED,  PLEASE BEGIN THE QUARANTINE INSTRUCTIONS AS OUTLINED IN YOUR HANDOUT.                Kurt Aguilar  05/17/2020   Your procedure is scheduled on: 05/29/20   Report to [facility] Main  Entrance   Report to admitting at     0730 AM     Call this number if you have problems the morning of surgery [phone]    Remember: Do not eat food , candy gum or mints :After Midnight. You may have clear liquids from midnight until   0645am    CLEAR LIQUID DIET   Foods Allowed                                                                       Coffee and tea, regular and decaf                              Plain Jell-O any favor except red or purple                                            Fruit ices (not with fruit pulp)                                      Iced Popsicles                                     Carbonated beverages, regular and diet                                    Cranberry, grape and apple juices Sports drinks like Gatorade Lightly seasoned clear broth or consume(fat free) Sugar, honey syrup   _____________________________________________________________________    BRUSH YOUR TEETH MORNING OF SURGERY AND RINSE YOUR MOUTH OUT, NO CHEWING GUM CANDY OR MINTS.     Take these medicines the morning of surgery with A SIP OF WATER:  Allopurinol, metoprolol, Prilosec  DO NOT TAKE ANY DIABETIC MEDICATIONS DAY OF YOUR SURGERY                                You may not have any  metal on your body including hair pins and              piercings  Do not wear jewelry, make-up, lotions, powders or perfumes, deodorant             Do not wear nail polish on your fingernails.  Do not shave  48 hours prior to surgery.              Men may shave face and neck.   Do not bring valuables to the hospital. [place].  Contacts, dentures or bridgework may not be worn into surgery.  Leave suitcase in the car. After surgery it may be brought to your room.     Patients discharged the day of surgery will not be allowed to drive home. IF YOU ARE HAVING SURGERY AND GOING HOME THE SAME DAY, YOU MUST HAVE AN ADULT TO DRIVE YOU HOME AND BE WITH YOU FOR 24 HOURS. YOU MAY GO HOME BY TAXI OR UBER OR ORTHERWISE, BUT AN ADULT MUST ACCOMPANY YOU HOME AND STAY WITH YOU FOR 24 HOURS.  Name and phone number of your driver:  Special Instructions: N/A              Please read over the following fact sheets you were given: _____________________________________________________________________  [facility] - Preparing for Surgery Before surgery, you can play an important role.  Because skin is not sterile, your skin needs to be as free of germs as possible.  You can reduce the number of germs on your skin by washing with CHG (chlorahexidine gluconate) soap before surgery.  CHG is an antiseptic cleaner which kills germs and bonds with the skin to continue killing germs even after washing. Please DO NOT use if you have an allergy to CHG or antibacterial soaps.  If your skin becomes reddened/irritated stop using the CHG and inform your nurse when you arrive at Short Stay. Do not shave (including legs and underarms) for at least 48 hours prior to the first CHG shower.  You may shave your face/neck. Please follow these instructions carefully:  1.  Shower with CHG Soap the night before surgery and the  morning of  Surgery.  2.  If you choose to wash your hair, wash your hair first as usual with your  normal  shampoo.  3.  After you shampoo, rinse your hair and body thoroughly to remove the  shampoo.                           4.  Use CHG as you would any other liquid soap.  You can apply chg directly  to the skin and wash                       Gently with a scrungie or clean washcloth.  5.  Apply the CHG Soap to your body ONLY FROM THE NECK DOWN.   Do not use on face/ open                           Wound or open sores. Avoid contact with eyes, ears mouth and genitals (private parts).                       Wash face,  Genitals (private parts) with your normal soap.             6.  Wash thoroughly, paying special attention to the area where your surgery  will be performed.  7.  Thoroughly rinse your body with warm water from the neck down.  8.  DO NOT shower/wash with your normal soap after using and rinsing off  the CHG Soap.                9.  Pat yourself dry with a clean towel.            10.  Wear clean pajamas.            11.  Place clean sheets on your bed the night of your first shower and do not  sleep with pets. Day of Surgery : Do not apply any lotions/deodorants the morning of surgery.  Please wear clean clothes to the hospital/surgery center.  FAILURE TO FOLLOW THESE INSTRUCTIONS MAY RESULT IN THE CANCELLATION OF YOUR SURGERY PATIENT SIGNATURE_________________________________  NURSE SIGNATURE__________________________________  ________________________________________________________________________

## 2020-05-17 NOTE — Progress Notes (Addendum)
Anesthesia Review:  [facility] Kurt Aguilar  Cardiologist : DR T Gollan  Chest x-ray : EKG : 04/17/20 Echo : Stress test: Cardiac Cath :  Activity level: Except for knee pt can do a flight of stairs without difficulty Sleep Study/ CPAP :-cpap- setting at 17 per pt  Fasting Blood Sugar :      / Checks Blood Sugar -- times a day:   Blood Thinner/ Instructions /Last Dose: ASA / Instructions/ Last Dose :  Stay on ASA 81 mg per pt per DR [name] Followed by Dr [name]- pulmonary for BOOP - LOV approx 4 mos ago - 11/24/19  Patient just returned on 05/07/20 on a trip from the [place] and [place] pt reports he had to be Covid tested daily on trip.  Afebrile at preop appt .  Travel screening questions completed and pt reports no exposure and covid tests negative and no symptoms. [name] made aware of at time of preop appt.  Renal insufficiency - [facility] [name] followed yearly per pt   DM- type 2  BMP done 05/17/20 routed via epic to DR [facility].

## 2020-05-18 LAB — GLUCOSE, CAPILLARY: Glucose-Capillary: 189 mg/dL — ABNORMAL HIGH (ref 70–99)

## 2020-05-23 ENCOUNTER — Telehealth: Payer: Self-pay | Admitting: Family Medicine

## 2020-05-23 NOTE — Telephone Encounter (Signed)
[name] @ [facility] called checking in surgical clearance that was faxed on 9/3  She will refax again today

## 2020-05-23 NOTE — H&P (Signed)
TOTAL KNEE ADMISSION H&P  Patient is being admitted for left total knee arthroplasty.  Subjective:  Chief Complaint:left knee pain.  HPI: Kurt Aguilar, 74 y.o. male, has a history of pain and functional disability in the left knee due to arthritis and has failed non-surgical conservative treatments for greater than 12 weeks to includeNSAID's and/or analgesics, corticosteriod injections, flexibility and strengthening excercises, use of assistive devices, weight reduction as appropriate and activity modification.  Onset of symptoms was gradual, starting 5 years ago with gradually worsening course since that time. The patient noted prior procedures on the knee to include  arthroscopy on the right knee(s).  Patient currently rates pain in the right knee(s) at 10 out of 10 with activity. Patient has night pain, worsening of pain with activity and weight bearing, pain that interferes with activities of daily living, crepitus and joint swelling.  Patient has evidence of subchondral cysts, subchondral sclerosis, periarticular osteophytes and joint space narrowing by imaging studies. There is no active infection.  Patient Active Problem List   Diagnosis Date Noted  . Nostril infection 08/02/2019  . Colon cancer screening 02/13/2017  . Diabetes mellitus type 2, uncontrolled ([place]) 08/13/2016  . Chronic heel pain, left 08/12/2016  . Upper airway cough syndrome 07/31/2016  . Obesity (BMI 30-39.9) 05/19/2016  . Routine general medical examination at a health care facility 01/27/2016  . Pulmonary infiltrates with high  ESR c/w BOOP/ idiopathic  12/19/2015  . Fatigue 12/12/2015  . Cough 11/29/2015  . Cystic kidney disease 10/01/2015  . Renal lesion 09/27/2015  . Erectile dysfunction of organic origin 09/27/2015  . BPH with obstruction/lower urinary tract symptoms 09/27/2015  . Constipation 09/24/2015  . Cyst of right kidney 09/24/2015  . CAD (coronary artery disease) 06/08/2015  . Grieving  11/22/2014  . Encounter for Medicare annual wellness exam 07/11/2013  . Prostate cancer screening 07/03/2013  . Right bundle branch block 02/22/2013  . Chronic low back pain 05/13/2011  . Obstructive sleep apnea 03/15/2010  . Hyperlipidemia associated with type 2 diabetes mellitus ([place]) 04/12/2009  . Gout 04/12/2009  . Essential hypertension 04/12/2009  . Coronary artery disease of bypass graft of native heart with stable angina pectoris ([place]) 04/12/2009  . GERD 04/12/2009  . Renal insufficiency 04/12/2009  . DIVERTICULITIS, HX OF 04/12/2009   Past Medical History:  Diagnosis Date  . Arthritis   . Chronic kidney disease   . Complication of anesthesia    constipation and inability to  urinate happened a few days after cataract surgery   . Diabetes ([place])    type 2   . Diverticulitis   . GERD (gastroesophageal reflux disease)   . Gout   . Heart attack ([place])   . Heart disease   . Heart murmur   . Hyperglycemia   . Hyperlipidemia   . Hypertension   . Osteoporosis   . Pneumonia    hx of BOOP followed by Dr [name]   . Sleep apnea    cpapp - setting at 17     Past Surgical History:  Procedure Laterality Date  . ACROMIO-CLAVICULAR JOINT REPAIR Left 05/05/2019   Procedure: SHOULDER ARTHROSCOPIC ASSISTED ACROMIO-CLAVICULAR JOINT REPAIR;  Surgeon: Tania Ade, MD;  Location: [facility];  Service: Orthopedics;  Laterality: Left;  . CATARACT EXTRACTION Bilateral   . CORONARY ARTERY BYPASS GRAFT     [place]  . open heart surgery  09/09/1995-1998   5 bypass  . REPAIR KNEE LIGAMENT     right  . SHOULDER  ARTHROSCOPY Left 05/05/2019   Procedure: ARTHROSCOPY SHOULDER;  Surgeon: Tania Ade, MD;  Location: [facility];  Service: Orthopedics;  Laterality: Left;  . TONSILLECTOMY      Current Facility-Administered Medications  Medication Dose Route Frequency Provider Last Rate Last Admin  . betamethasone acetate-betamethasone sodium phosphate (CELESTONE) injection 3 mg  3 mg  Intramuscular Once Edrick Kins, DPM       Current Outpatient Medications  Medication Sig Dispense Refill Last Dose  . allopurinol (ZYLOPRIM) 100 MG tablet TAKE 1 TABLET BY MOUTH TWICE A DAY (Patient taking differently: Take 100 mg by mouth 2 (two) times daily. ) 180 tablet 0   . aspirin 81 MG tablet Take 81 mg by mouth daily.      Marland Kitchen Bioflavonoid Products (VITAMIN C) CHEW Chew 1 tablet by mouth daily.     . canagliflozin (INVOKANA) 100 MG TABS tablet Take 1 tablet (100 mg total) by mouth every other day. (Patient taking differently: Take 50 mg by mouth daily. ) 30 tablet 5   . Carboxymethylcellul-Glycerin (LUBRICATING EYE DROPS OP) Place 1 drop into both eyes daily as needed (dry eyes).     . Cholecalciferol (VITAMIN D3 PO) Take 1 capsule by mouth daily.     Marland Kitchen CINNAMON PO Take 1,000 mg by mouth 2 (two) times daily.     . colchicine 0.6 MG tablet TAKE 1 TABLET BY MOUTH TWICE A DAY WITH A MEAL AS NEEDED FOR ACUTE FLARE OF GOUT (Patient taking differently: Take 0.6 mg by mouth 2 (two) times daily as needed (gout flare). ) 45 tablet 3   . Dextromethorphan-guaiFENesin (MUCINEX DM MAXIMUM STRENGTH) 60-1200 MG TB12 Take 1 tablet by mouth 2 (two) times daily as needed (congestion).      . ezetimibe (ZETIA) 10 MG tablet Take 1 tablet (10 mg total) by mouth daily. 90 tablet 3   . famotidine (PEPCID) 20 MG tablet TAKE 1 TABLET BY MOUTH EVERYDAY AT BEDTIME (Patient taking differently: Take 20 mg by mouth at bedtime. ) 90 tablet 1   . Glucosamine HCl (GLUCOSAMINE PO) Take 1,500 mg by mouth 2 (two) times daily.      . Lutein 20 MG CAPS Take 20 mg by mouth daily.      . metoprolol tartrate (LOPRESSOR) 25 MG tablet Take 0.5 tablets (12.5 mg total) by mouth daily. 45 tablet 3   . Multiple Vitamins-Minerals (ZINC PO) Take 1 tablet by mouth daily.     Marland Kitchen omeprazole (PRILOSEC) 20 MG capsule Take 2 capsules (40 mg total) by mouth daily before breakfast. 60 capsule 3   . rosuvastatin (CRESTOR) 40 MG tablet Take 1  tablet (40 mg total) by mouth daily. 90 tablet 3   . Blood Glucose Monitoring Suppl (FREESTYLE LITE) DEVI 1 each by Does not apply route 4 (four) times daily. E11.65 1 each 0   . glucose blood (FREESTYLE LITE) test strip 1 each by Other route 4 (four) times daily. E11.65 400 strip 0   . HYDROcodone-acetaminophen (NORCO) 5-325 MG tablet Take 1-2 tablets by mouth every 6 (six) hours as needed for moderate pain or severe pain. With food (Patient not taking: Reported on 05/15/2020) 40 tablet 0 Completed Course at Unknown time  . Lancets (FREESTYLE) lancets 1 each by Other route 4 (four) times daily. E11.65 400 each 0   . methocarbamol (ROBAXIN) 500 MG tablet Take 1 tablet (500 mg total) by mouth every 8 (eight) hours as needed for muscle spasms. Caution of sedation (Patient not taking:  Reported on 05/15/2020) 30 tablet 1 Completed Course at Unknown time  . ondansetron (ZOFRAN) 8 MG tablet Take 1 tablet (8 mg total) by mouth every 8 (eight) hours as needed for nausea or vomiting. From narcotic pain medicine (Patient not taking: Reported on 05/15/2020) 15 tablet 0 Completed Course at Unknown time   Allergies  Allergen Reactions  . Oxycodone-Acetaminophen Other (See Comments)    Stops breathing    Social History   Tobacco Use  . Smoking status: Former Smoker    Packs/day: 1.00    Years: 3.00    Pack years: 3.00    Types: Cigars    Quit date: 11/25/2004    Years since quitting: 15.5  . Smokeless tobacco: Never Used  . Tobacco comment: occas. cigar quit 2006  Substance Use Topics  . Alcohol use: Yes    Alcohol/week: 1.0 standard drink    Types: 1 Standard drinks or equivalent per week    Comment: rare    Family History  Problem Relation Age of Onset  . Heart disease Mother   . Heart disease Father   . Stroke Father   . Heart attack Son 72  . Kidney disease Neg Hx   . Prostate cancer Neg Hx   . Diabetes Neg Hx      Review of Systems  Musculoskeletal: Positive for arthralgias.       Left  knee  All other systems reviewed and are negative.   Objective:  Physical Exam Constitutional:      Appearance: Normal appearance.  HENT:     Head: Normocephalic and atraumatic.     Nose: Nose normal.     Mouth/Throat:     Pharynx: Oropharynx is clear.  Eyes:     Extraocular Movements: Extraocular movements intact.  Cardiovascular:     Rate and Rhythm: Normal rate and regular rhythm.  Pulmonary:     Effort: Pulmonary effort is normal.  Abdominal:     Palpations: Abdomen is soft.  Musculoskeletal:     Cervical back: Normal range of motion.     Comments: Both knees move about 0-100.  He has medial and lateral joint line pain with crepitation on both sides.  On the right he has a 5 cm scar and on the left I cannot really even find his arthroscopic portal scars.  On the left he does have a vein harvest scar on his calf from his bypass operation.  He has trace effusion on the left.  Hip motion is full and straight leg raise is negative.  Sensation and motor function are intact in his feet with palpable pulses on both sides.   Skin:    General: Skin is warm and dry.  Neurological:     General: No focal deficit present.     Mental Status: He is alert and oriented to person, place, and time.  Psychiatric:        Mood and Affect: Mood normal.        Behavior: Behavior normal.     Vital signs in last 24 hours:    Labs:   Estimated body mass index is 32.28 kg/m as calculated from the following:   Height as of 05/17/20: 5' 10"  (1.778 m).   Weight as of 05/17/20: 102.1 kg.   Imaging Review Plain radiographs demonstrate severe degenerative joint disease of the left knee(s). The overall alignment isneutral. The bone quality appears to be good for age and reported activity level.      Assessment/Plan:  End stage arthritis, left knee   The patient history, physical examination, clinical judgment of the provider and imaging studies are consistent with end stage degenerative  joint disease of the left knee(s) and total knee arthroplasty is deemed medically necessary. The treatment options including medical management, injection therapy arthroscopy and arthroplasty were discussed at length. The risks and benefits of total knee arthroplasty were presented and reviewed. The risks due to aseptic loosening, infection, stiffness, patella tracking problems, thromboembolic complications and other imponderables were discussed. The patient acknowledged the explanation, agreed to proceed with the plan and consent was signed. Patient is being admitted for inpatient treatment for surgery, pain control, PT, OT, prophylactic antibiotics, VTE prophylaxis, progressive ambulation and ADL's and discharge planning. The patient is planning to be discharged home with home health services     Patient's anticipated LOS is less than 2 midnights, meeting these requirements: - Younger than 66 - Lives within 1 hour of care - Has a competent adult at home to recover with post-op recover - NO history of  - Chronic pain requiring opiods  - Diabetes  - Coronary Artery Disease  - Heart failure  - Heart attack  - Stroke  - DVT/VTE  - Cardiac arrhythmia  - Respiratory Failure/COPD  - Renal failure  - Anemia  - Advanced Liver disease

## 2020-05-24 NOTE — Telephone Encounter (Signed)
Dr. Glori Bickers put a note on his surgical clearance from saying:  "he was cleared by cardiology. I need last renal note before I can sign off (last CR was elevated here)  Called and have faxed multiple request to nephrology to get labs and last OV note as PCP requested with no response. I called pt and advised him of Dr. Marliss Coots request. Pt said he hasn't seen Dr. Candiss Norse in almost a year if not longer his next appt with him won't be until Oct/ Nov after his surgery. Pt said he has no recent appt with nephrologist and "Dr. Glori Bickers better not mess this up" I advise pt I would let Dr. Glori Bickers know. In the meantime pt has had labs and hospital. Please review labs and if able sign off on surgical clearance or please advise further. Pt is to have surgery on Tuesday. Placed back in inbox for review

## 2020-05-24 NOTE — Telephone Encounter (Signed)
That is fine - I wanted to make sure he is up to date with renal care and nothing special needs to be done  Also want to make sure he is going to be safe (no intention of "messing things up")     On his clearance form I mentioned that monitoring glucose and renal status is important for him

## 2020-05-24 NOTE — Telephone Encounter (Signed)
Form faxed

## 2020-05-24 NOTE — Progress Notes (Signed)
Anesthesia Chart Review   Case: [id number] Date/Time: 05/29/20 0939   Procedure: LEFT TOTAL KNEE ARTHROPLASTY (Left Knee)   Anesthesia type: Spinal   Pre-op diagnosis: LEFT KNEE DEGENERATIVE JOINT DISEASE   Location: [facility] ROOM 08 / [facility]   Surgeons: Melrose Nakayama, MD      DISCUSSION:74 y.o. former smoker (3 pack years, quit 11/25/04) with h/o GERD, CAD (CABG 1997), HTN, upper airway cough syndrome, sleep apnea, DM II A1C 6.6 on 05/17/2020, CKD (creatinine stable), left knee djd scheduled for above procedure 05/29/2020 with Dr. Melrose Nakayama.   Per cardiology preoperative risk assessment 05/15/2020, "Chart reviewed and patient contacted today by phone as part of pre-operative protocol coverage. Given past medical history and time since last visit, based on ACC/AHA guidelines, LARRELL RAPOZO would be at acceptable risk for the planned procedure without further cardiovascular testing.  Dr [name] would prefer the patient remain on Asprin 81 mg if possible."  Last seen 04/17/2020, stable at this visit.   Last seen by PCP 04/11/2020.   Last seen by endocrinologist, stable at this visit.   Anticipate pt can proceed with planned procedure barring acute status change.   VS: BP (!) 168/74   Pulse 84   Temp 36.8 C (Oral)   Resp 16   Ht 5\' 10"  (1.778 m)   Wt 102.1 kg   SpO2 99%   BMI 32.28 kg/m   PROVIDERS: [facility], [name], MD is PCP   [name], MD is Cardiologist   Renato Shin, MD is Endocrinologist   Christinia Gully, MD is Pulmonologist  LABS: Labs reviewed: Acceptable for surgery. (all labs ordered are listed, but only abnormal results are displayed)  Labs Reviewed  HEMOGLOBIN A1C - Abnormal; Notable for the following components:      Result Value   Hgb A1c MFr Bld 6.6 (*)    All other components within normal limits  BASIC METABOLIC PANEL - Abnormal; Notable for the following components:   Glucose, Bld 193 (*)    BUN 40 (*)    Creatinine, Ser 1.89 (*)    GFR calc non Af  Amer 34 (*)    GFR calc Af Amer 39 (*)    All other components within normal limits  GLUCOSE, CAPILLARY - Abnormal; Notable for the following components:   Glucose-Capillary 189 (*)    All other components within normal limits  SURGICAL PCR SCREEN  CBC     IMAGES:   EKG: 04/17/2020 Rate 68 bpm  NSR RBBB  CV: Echo 05/22/2009 Study Conclusions   1. Left ventricle: The cavity size was normal. Wall thickness was   normal. Systolic function was normal. The estimated ejection   fraction was in the range of 55% to 60%.  2. Aortic valve: Mild regurgitation. Valve area: 2.8cm^2 (Vmax).  3. Mitral valve: Calcified annulus.  4. Pulmonary arteries: Systolic pressure was mildly increased.  Impressions:   - Extremely limited due to poor sound wave transmission; LV function   appears to be preserved; focal wall motion abnormality cannot be   excluded.  Past Medical History:  Diagnosis Date  . Arthritis   . Chronic kidney disease   . Complication of anesthesia    constipation and inability to  urinate happened a few days after cataract surgery   . Diabetes ([place])    type 2   . Diverticulitis   . GERD (gastroesophageal reflux disease)   . Gout   . Heart attack ([place])   . Heart disease   .  Heart murmur   . Hyperglycemia   . Hyperlipidemia   . Hypertension   . Osteoporosis   . Pneumonia    hx of BOOP followed by Dr [name]   . Sleep apnea    cpapp - setting at 17     Past Surgical History:  Procedure Laterality Date  . ACROMIO-CLAVICULAR JOINT REPAIR Left 05/05/2019   Procedure: SHOULDER ARTHROSCOPIC ASSISTED ACROMIO-CLAVICULAR JOINT REPAIR;  Surgeon: Tania Ade, MD;  Location: [facility];  Service: Orthopedics;  Laterality: Left;  . CATARACT EXTRACTION Bilateral   . CORONARY ARTERY BYPASS GRAFT     [place]  . open heart surgery  09/09/1995-1998   5 bypass  . REPAIR KNEE LIGAMENT     right  . SHOULDER ARTHROSCOPY Left 05/05/2019   Procedure:  ARTHROSCOPY SHOULDER;  Surgeon: Tania Ade, MD;  Location: [facility];  Service: Orthopedics;  Laterality: Left;  . TONSILLECTOMY      MEDICATIONS: . allopurinol (ZYLOPRIM) 100 MG tablet  . aspirin 81 MG tablet  . Bioflavonoid Products (VITAMIN C) CHEW  . Blood Glucose Monitoring Suppl (FREESTYLE LITE) DEVI  . canagliflozin (INVOKANA) 100 MG TABS tablet  . Carboxymethylcellul-Glycerin (LUBRICATING EYE DROPS OP)  . Cholecalciferol (VITAMIN D3 PO)  . CINNAMON PO  . colchicine 0.6 MG tablet  . Dextromethorphan-guaiFENesin (MUCINEX DM MAXIMUM STRENGTH) 60-1200 MG TB12  . ezetimibe (ZETIA) 10 MG tablet  . famotidine (PEPCID) 20 MG tablet  . Glucosamine HCl (GLUCOSAMINE PO)  . glucose blood (FREESTYLE LITE) test strip  . HYDROcodone-acetaminophen (NORCO) 5-325 MG tablet  . Lancets (FREESTYLE) lancets  . Lutein 20 MG CAPS  . methocarbamol (ROBAXIN) 500 MG tablet  . metoprolol tartrate (LOPRESSOR) 25 MG tablet  . Multiple Vitamins-Minerals (ZINC PO)  . omeprazole (PRILOSEC) 20 MG capsule  . ondansetron (ZOFRAN) 8 MG tablet  . rosuvastatin (CRESTOR) 40 MG tablet   . betamethasone acetate-betamethasone sodium phosphate (CELESTONE) injection 3 mg     Konrad Felix, PA-C [facility] [phone]

## 2020-05-25 ENCOUNTER — Other Ambulatory Visit (HOSPITAL_COMMUNITY)
Admission: RE | Admit: 2020-05-25 | Discharge: 2020-05-25 | Disposition: A | Discharge status: Home / Self Care | Payer: PPO | Source: Ambulatory Visit | Attending: Orthopaedic Surgery | Admitting: Orthopaedic Surgery

## 2020-05-25 DIAGNOSIS — Z20822 Contact with and (suspected) exposure to covid-19: Secondary | ICD-10-CM | POA: Insufficient documentation

## 2020-05-25 DIAGNOSIS — Z01812 Encounter for preprocedural laboratory examination: Secondary | ICD-10-CM | POA: Insufficient documentation

## 2020-05-25 LAB — SARS CORONAVIRUS 2 (TAT 6-24 HRS): SARS Coronavirus 2: NEGATIVE

## 2020-05-28 ENCOUNTER — Encounter (HOSPITAL_COMMUNITY): Payer: Self-pay | Admitting: Orthopaedic Surgery

## 2020-05-28 MED ORDER — BUPIVACAINE LIPOSOME 1.3 % IJ SUSP
20.0000 mL | INTRAMUSCULAR | Status: DC
  Filled 2020-05-28: qty 20

## 2020-05-28 MED ORDER — TRANEXAMIC ACID 1000 MG/10ML IV SOLN
2000.0000 mg | INTRAVENOUS | Status: DC
  Filled 2020-05-28: qty 20

## 2020-05-29 ENCOUNTER — Encounter (HOSPITAL_COMMUNITY)
Admission: RE | Disposition: A | Discharge status: Home / Self Care | Payer: Self-pay | Source: Home / Self Care | Attending: Orthopaedic Surgery

## 2020-05-29 ENCOUNTER — Ambulatory Visit (HOSPITAL_COMMUNITY): Payer: PPO | Admitting: Anesthesiology

## 2020-05-29 ENCOUNTER — Ambulatory Visit (HOSPITAL_COMMUNITY)
Admission: RE | Admit: 2020-05-29 | Discharge: 2020-05-29 | Disposition: A | Discharge status: Home / Self Care | Payer: PPO | Attending: Orthopaedic Surgery | Admitting: Orthopaedic Surgery

## 2020-05-29 ENCOUNTER — Ambulatory Visit (HOSPITAL_COMMUNITY): Payer: PPO | Admitting: Physician Assistant

## 2020-05-29 ENCOUNTER — Encounter (HOSPITAL_COMMUNITY): Payer: Self-pay | Admitting: Orthopaedic Surgery

## 2020-05-29 DIAGNOSIS — E785 Hyperlipidemia, unspecified: Secondary | ICD-10-CM | POA: Insufficient documentation

## 2020-05-29 DIAGNOSIS — Z87891 Personal history of nicotine dependence: Secondary | ICD-10-CM | POA: Diagnosis not present

## 2020-05-29 DIAGNOSIS — I25118 Atherosclerotic heart disease of native coronary artery with other forms of angina pectoris: Secondary | ICD-10-CM | POA: Diagnosis not present

## 2020-05-29 DIAGNOSIS — M1712 Unilateral primary osteoarthritis, left knee: Secondary | ICD-10-CM | POA: Diagnosis present

## 2020-05-29 DIAGNOSIS — Z7982 Long term (current) use of aspirin: Secondary | ICD-10-CM | POA: Insufficient documentation

## 2020-05-29 DIAGNOSIS — I129 Hypertensive chronic kidney disease with stage 1 through stage 4 chronic kidney disease, or unspecified chronic kidney disease: Secondary | ICD-10-CM | POA: Insufficient documentation

## 2020-05-29 DIAGNOSIS — K219 Gastro-esophageal reflux disease without esophagitis: Secondary | ICD-10-CM | POA: Insufficient documentation

## 2020-05-29 DIAGNOSIS — Z7984 Long term (current) use of oral hypoglycemic drugs: Secondary | ICD-10-CM | POA: Diagnosis not present

## 2020-05-29 DIAGNOSIS — M109 Gout, unspecified: Secondary | ICD-10-CM | POA: Insufficient documentation

## 2020-05-29 DIAGNOSIS — G8918 Other acute postprocedural pain: Secondary | ICD-10-CM | POA: Diagnosis not present

## 2020-05-29 DIAGNOSIS — G4730 Sleep apnea, unspecified: Secondary | ICD-10-CM | POA: Insufficient documentation

## 2020-05-29 DIAGNOSIS — I1310 Hypertensive heart and chronic kidney disease without heart failure, with stage 1 through stage 4 chronic kidney disease, or unspecified chronic kidney disease: Secondary | ICD-10-CM | POA: Diagnosis not present

## 2020-05-29 DIAGNOSIS — N189 Chronic kidney disease, unspecified: Secondary | ICD-10-CM | POA: Diagnosis not present

## 2020-05-29 DIAGNOSIS — Z951 Presence of aortocoronary bypass graft: Secondary | ICD-10-CM | POA: Diagnosis not present

## 2020-05-29 DIAGNOSIS — E1122 Type 2 diabetes mellitus with diabetic chronic kidney disease: Secondary | ICD-10-CM | POA: Diagnosis not present

## 2020-05-29 DIAGNOSIS — Z79899 Other long term (current) drug therapy: Secondary | ICD-10-CM | POA: Insufficient documentation

## 2020-05-29 DIAGNOSIS — Z96652 Presence of left artificial knee joint: Secondary | ICD-10-CM | POA: Diagnosis not present

## 2020-05-29 DIAGNOSIS — Z01818 Encounter for other preprocedural examination: Secondary | ICD-10-CM

## 2020-05-29 HISTORY — PX: TOTAL KNEE ARTHROPLASTY: SHX125

## 2020-05-29 LAB — URINALYSIS, ROUTINE W REFLEX MICROSCOPIC
Bacteria, UA: NONE SEEN
Bilirubin Urine: NEGATIVE
Glucose, UA: 150 mg/dL — AB
Ketones, ur: NEGATIVE mg/dL
Leukocytes,Ua: NEGATIVE
Nitrite: NEGATIVE
Protein, ur: NEGATIVE mg/dL
Specific Gravity, Urine: 1.005 (ref 1.005–1.030)
pH: 5 (ref 5.0–8.0)

## 2020-05-29 LAB — TYPE AND SCREEN
ABO/RH(D): A POS
Antibody Screen: NEGATIVE

## 2020-05-29 LAB — PROTIME-INR
INR: 1.1 (ref 0.8–1.2)
Prothrombin Time: 13.3 seconds (ref 11.4–15.2)

## 2020-05-29 LAB — GLUCOSE, CAPILLARY
Glucose-Capillary: 115 mg/dL — ABNORMAL HIGH (ref 70–99)
Glucose-Capillary: 129 mg/dL — ABNORMAL HIGH (ref 70–99)

## 2020-05-29 LAB — ABO/RH: ABO/RH(D): A POS

## 2020-05-29 LAB — APTT: aPTT: 31 seconds (ref 24–36)

## 2020-05-29 SURGERY — ARTHROPLASTY, KNEE, TOTAL
Anesthesia: Spinal | Site: Knee | Laterality: Left

## 2020-05-29 MED ORDER — PROPOFOL 500 MG/50ML IV EMUL
INTRAVENOUS | Status: AC
  Filled 2020-05-29: qty 50

## 2020-05-29 MED ORDER — BUPIVACAINE-EPINEPHRINE (PF) 0.5% -1:200000 IJ SOLN
INTRAMUSCULAR | Status: DC | PRN
  Administered 2020-05-29: 30 mL via PERINEURAL

## 2020-05-29 MED ORDER — METHOCARBAMOL 500 MG IVPB - SIMPLE MED: 500.0000 mg | Freq: Four times a day (QID) | INTRAVENOUS | Status: DC | PRN

## 2020-05-29 MED ORDER — BUPIVACAINE-EPINEPHRINE 0.25% -1:200000 IJ SOLN
INTRAMUSCULAR | Status: AC
  Filled 2020-05-29: qty 1

## 2020-05-29 MED ORDER — FENTANYL CITRATE (PF) 100 MCG/2ML IJ SOLN
INTRAMUSCULAR | Status: AC
  Filled 2020-05-29: qty 2

## 2020-05-29 MED ORDER — ONDANSETRON HCL 4 MG/2ML IJ SOLN
INTRAMUSCULAR | Status: DC | PRN
  Administered 2020-05-29: 4 mg via INTRAVENOUS

## 2020-05-29 MED ORDER — SODIUM CHLORIDE 0.9 % IR SOLN
Status: DC | PRN
  Administered 2020-05-29: 3000 mL

## 2020-05-29 MED ORDER — ASPIRIN 81 MG PO TABS: 81.0000 mg | ORAL_TABLET | Freq: Two times a day (BID) | ORAL | 0 refills | Status: DC

## 2020-05-29 MED ORDER — DEXMEDETOMIDINE (PRECEDEX) IN NS 20 MCG/5ML (4 MCG/ML) IV SYRINGE
PREFILLED_SYRINGE | INTRAVENOUS | Status: AC
  Filled 2020-05-29: qty 5

## 2020-05-29 MED ORDER — PHENYLEPHRINE HCL-NACL 10-0.9 MG/250ML-% IV SOLN
INTRAVENOUS | Status: DC | PRN
  Administered 2020-05-29: 25 ug/min via INTRAVENOUS

## 2020-05-29 MED ORDER — SODIUM CHLORIDE (PF) 0.9 % IJ SOLN
INTRAMUSCULAR | Status: AC
  Filled 2020-05-29: qty 50

## 2020-05-29 MED ORDER — LACTATED RINGERS IV BOLUS
500.0000 mL | Freq: Once | INTRAVENOUS | Status: AC
  Administered 2020-05-29: 500 mL via INTRAVENOUS

## 2020-05-29 MED ORDER — FENTANYL CITRATE (PF) 100 MCG/2ML IJ SOLN
50.0000 ug | INTRAMUSCULAR | Status: DC
  Administered 2020-05-29: 100 ug via INTRAVENOUS
  Filled 2020-05-29: qty 2

## 2020-05-29 MED ORDER — CLONIDINE HCL (ANALGESIA) 100 MCG/ML EP SOLN
EPIDURAL | Status: DC | PRN
  Administered 2020-05-29: 100 ug

## 2020-05-29 MED ORDER — PROPOFOL 1000 MG/100ML IV EMUL
INTRAVENOUS | Status: AC
  Filled 2020-05-29: qty 100

## 2020-05-29 MED ORDER — MIDAZOLAM HCL 2 MG/2ML IJ SOLN
1.0000 mg | INTRAMUSCULAR | Status: DC
  Filled 2020-05-29: qty 2

## 2020-05-29 MED ORDER — DEXAMETHASONE SODIUM PHOSPHATE 4 MG/ML IJ SOLN
INTRAMUSCULAR | Status: DC | PRN
  Administered 2020-05-29: 5 mg via INTRAVENOUS

## 2020-05-29 MED ORDER — PROPOFOL 10 MG/ML IV BOLUS
INTRAVENOUS | Status: DC | PRN
  Administered 2020-05-29 (×2): 10 mg via INTRAVENOUS

## 2020-05-29 MED ORDER — HYDROMORPHONE HCL 1 MG/ML IJ SOLN: 0.2500 mg | INTRAMUSCULAR | Status: DC | PRN

## 2020-05-29 MED ORDER — 0.9 % SODIUM CHLORIDE (POUR BTL) OPTIME
TOPICAL | Status: DC | PRN
  Administered 2020-05-29: 1000 mL

## 2020-05-29 MED ORDER — TRANEXAMIC ACID 1000 MG/10ML IV SOLN
INTRAVENOUS | Status: DC | PRN
  Administered 2020-05-29: 2000 mg via TOPICAL

## 2020-05-29 MED ORDER — PROPOFOL 500 MG/50ML IV EMUL
INTRAVENOUS | Status: DC | PRN
  Administered 2020-05-29: 85 ug/kg/min via INTRAVENOUS

## 2020-05-29 MED ORDER — LACTATED RINGERS IV BOLUS
250.0000 mL | Freq: Once | INTRAVENOUS | Status: AC
  Administered 2020-05-29: 250 mL via INTRAVENOUS

## 2020-05-29 MED ORDER — ORAL CARE MOUTH RINSE: 15.0000 mL | Freq: Once | OROMUCOSAL | Status: AC

## 2020-05-29 MED ORDER — BUPIVACAINE-EPINEPHRINE 0.5% -1:200000 IJ SOLN
INTRAMUSCULAR | Status: DC | PRN
  Administered 2020-05-29: 30 mL

## 2020-05-29 MED ORDER — HYDROCODONE-ACETAMINOPHEN 5-325 MG PO TABS
1.0000 | ORAL_TABLET | Freq: Four times a day (QID) | ORAL | 0 refills | Status: DC | PRN
Start: 2020-05-29 — End: 2020-12-04

## 2020-05-29 MED ORDER — METHOCARBAMOL 500 MG PO TABS: 500.0000 mg | ORAL_TABLET | Freq: Four times a day (QID) | ORAL | Status: DC | PRN

## 2020-05-29 MED ORDER — OXYCODONE HCL 5 MG PO TABS: 5.0000 mg | ORAL_TABLET | Freq: Once | ORAL | Status: DC | PRN

## 2020-05-29 MED ORDER — CHLORHEXIDINE GLUCONATE 0.12 % MT SOLN
15.0000 mL | Freq: Once | OROMUCOSAL | Status: AC
  Administered 2020-05-29: 15 mL via OROMUCOSAL

## 2020-05-29 MED ORDER — LACTATED RINGERS IV SOLN: INTRAVENOUS | Status: DC

## 2020-05-29 MED ORDER — CEFAZOLIN SODIUM-DEXTROSE 2-4 GM/100ML-% IV SOLN: 2.0000 g | Freq: Four times a day (QID) | INTRAVENOUS | Status: DC

## 2020-05-29 MED ORDER — METHOCARBAMOL 500 MG PO TABS: 500.0000 mg | ORAL_TABLET | Freq: Three times a day (TID) | ORAL | 1 refills | Status: DC | PRN

## 2020-05-29 MED ORDER — POVIDONE-IODINE 10 % EX SWAB
2.0000 "application " | Freq: Once | CUTANEOUS | Status: AC
  Administered 2020-05-29: 2 via TOPICAL

## 2020-05-29 MED ORDER — LIDOCAINE 2% (20 MG/ML) 5 ML SYRINGE
INTRAMUSCULAR | Status: DC | PRN
  Administered 2020-05-29: 40 mg via INTRAVENOUS

## 2020-05-29 MED ORDER — BUPIVACAINE IN DEXTROSE 0.75-8.25 % IT SOLN
INTRATHECAL | Status: DC | PRN
  Administered 2020-05-29: 1.6 mL via INTRATHECAL

## 2020-05-29 MED ORDER — EPHEDRINE 5 MG/ML INJ
INTRAVENOUS | Status: AC
  Filled 2020-05-29: qty 20

## 2020-05-29 MED ORDER — CEFAZOLIN SODIUM-DEXTROSE 2-4 GM/100ML-% IV SOLN
2.0000 g | INTRAVENOUS | Status: AC
  Administered 2020-05-29: 2 g via INTRAVENOUS
  Filled 2020-05-29: qty 100

## 2020-05-29 MED ORDER — DEXMEDETOMIDINE (PRECEDEX) IN NS 20 MCG/5ML (4 MCG/ML) IV SYRINGE
PREFILLED_SYRINGE | INTRAVENOUS | Status: DC | PRN
  Administered 2020-05-29 (×3): 4 ug via INTRAVENOUS

## 2020-05-29 MED ORDER — TRANEXAMIC ACID-NACL 1000-0.7 MG/100ML-% IV SOLN
1000.0000 mg | INTRAVENOUS | Status: AC
  Administered 2020-05-29: 1000 mg via INTRAVENOUS
  Filled 2020-05-29: qty 100

## 2020-05-29 MED ORDER — DEXAMETHASONE SODIUM PHOSPHATE 10 MG/ML IJ SOLN
INTRAMUSCULAR | Status: DC | PRN
  Administered 2020-05-29: 8 mg via INTRAVENOUS

## 2020-05-29 MED ORDER — TRANEXAMIC ACID-NACL 1000-0.7 MG/100ML-% IV SOLN: 1000.0000 mg | Freq: Once | INTRAVENOUS | Status: DC

## 2020-05-29 MED ORDER — BUPIVACAINE LIPOSOME 1.3 % IJ SUSP
INTRAMUSCULAR | Status: DC | PRN
  Administered 2020-05-29: 20 mL

## 2020-05-29 MED ORDER — SODIUM CHLORIDE (PF) 0.9 % IJ SOLN
INTRAMUSCULAR | Status: DC | PRN
  Administered 2020-05-29: 30 mL

## 2020-05-29 MED ORDER — OXYCODONE HCL 5 MG/5ML PO SOLN: 5.0000 mg | Freq: Once | ORAL | Status: DC | PRN

## 2020-05-29 MED ORDER — LIDOCAINE 2% (20 MG/ML) 5 ML SYRINGE
INTRAMUSCULAR | Status: AC
  Filled 2020-05-29: qty 10

## 2020-05-29 SURGICAL SUPPLY — 54 items
ATTUNE MED DOME PAT 38 KNEE (Knees) ×2 IMPLANT
ATTUNE PS FEM LT SZ 6 CEM KNEE (Femur) ×2 IMPLANT
ATTUNE PSRP INSR SZ6 10 KNEE (Insert) ×2 IMPLANT
BAG DECANTER FOR FLEXI CONT (MISCELLANEOUS) ×2 IMPLANT
BAG ZIPLOCK 12X15 (MISCELLANEOUS) ×2 IMPLANT
BASE TIBIAL ROT PLAT SZ 7 KNEE (Knees) ×1 IMPLANT
BLADE SAGITTAL 25.0X1.19X90 (BLADE) ×2 IMPLANT
BLADE SAW SGTL 11.0X1.19X90.0M (BLADE) ×2 IMPLANT
BNDG ELASTIC 6X10 VLCR STRL LF (GAUZE/BANDAGES/DRESSINGS) ×2 IMPLANT
BNDG ELASTIC 6X5.8 VLCR STR LF (GAUZE/BANDAGES/DRESSINGS) ×2 IMPLANT
BOOTIES KNEE HIGH SLOAN (MISCELLANEOUS) ×2 IMPLANT
BOWL SMART MIX CTS (DISPOSABLE) ×2 IMPLANT
CEMENT HV SMART SET (Cement) ×4 IMPLANT
COVER SURGICAL LIGHT HANDLE (MISCELLANEOUS) ×2 IMPLANT
COVER WAND RF STERILE (DRAPES) ×2 IMPLANT
CUFF TOURN SGL QUICK 34 (TOURNIQUET CUFF) ×1
CUFF TRNQT CYL 34X4.125X (TOURNIQUET CUFF) ×1 IMPLANT
DECANTER SPIKE VIAL GLASS SM (MISCELLANEOUS) ×4 IMPLANT
DRAPE ORTHO SPLIT 77X108 STRL (DRAPES)
DRAPE SHEET LG 3/4 BI-LAMINATE (DRAPES) ×2 IMPLANT
DRAPE SURG ORHT 6 SPLT 77X108 (DRAPES) IMPLANT
DRAPE TOP 10253 STERILE (DRAPES) ×2 IMPLANT
DRAPE U-SHAPE 47X51 STRL (DRAPES) ×2 IMPLANT
DRESSING AQUACEL AG SP 3.5X10 (GAUZE/BANDAGES/DRESSINGS) ×1 IMPLANT
DRSG AQUACEL AG ADV 3.5X10 (GAUZE/BANDAGES/DRESSINGS) ×2 IMPLANT
DRSG AQUACEL AG SP 3.5X10 (GAUZE/BANDAGES/DRESSINGS) ×2
DURAPREP 26ML APPLICATOR (WOUND CARE) ×4 IMPLANT
ELECT REM PT RETURN 15FT ADLT (MISCELLANEOUS) ×2 IMPLANT
GLOVE BIO SURGEON STRL SZ8 (GLOVE) ×4 IMPLANT
GLOVE BIOGEL PI IND STRL 8 (GLOVE) ×2 IMPLANT
GLOVE BIOGEL PI INDICATOR 8 (GLOVE) ×2
GOWN STRL REUS W/TWL XL LVL3 (GOWN DISPOSABLE) ×4 IMPLANT
HANDPIECE INTERPULSE COAX TIP (DISPOSABLE) ×1
HOLDER FOLEY CATH W/STRAP (MISCELLANEOUS) IMPLANT
HOOD PEEL AWAY FLYTE STAYCOOL (MISCELLANEOUS) ×6 IMPLANT
KIT TURNOVER KIT A (KITS) IMPLANT
MANIFOLD NEPTUNE II (INSTRUMENTS) ×2 IMPLANT
NS IRRIG 1000ML POUR BTL (IV SOLUTION) ×2 IMPLANT
PACK TOTAL KNEE CUSTOM (KITS) ×2 IMPLANT
PAD ARMBOARD 7.5X6 YLW CONV (MISCELLANEOUS) ×2 IMPLANT
PENCIL SMOKE EVACUATOR (MISCELLANEOUS) IMPLANT
PIN DRILL FIX HALF THREAD (BIT) ×2 IMPLANT
PIN STEINMAN FIXATION KNEE (PIN) ×2 IMPLANT
PROTECTOR NERVE ULNAR (MISCELLANEOUS) ×2 IMPLANT
SET HNDPC FAN SPRY TIP SCT (DISPOSABLE) ×1 IMPLANT
SUT ETHIBOND NAB CT1 #1 30IN (SUTURE) ×4 IMPLANT
SUT VIC AB 0 CT1 36 (SUTURE) ×2 IMPLANT
SUT VIC AB 2-0 CT1 27 (SUTURE) ×1
SUT VIC AB 2-0 CT1 TAPERPNT 27 (SUTURE) ×1 IMPLANT
SUT VICRYL AB 3-0 FS1 BRD 27IN (SUTURE) ×2 IMPLANT
TIBIAL BASE ROT PLAT SZ 7 KNEE (Knees) ×2 IMPLANT
TRAY FOLEY MTR SLVR 16FR STAT (SET/KITS/TRAYS/PACK) IMPLANT
WATER STERILE IRR 1000ML POUR (IV SOLUTION) ×2 IMPLANT
WRAP KNEE MAXI GEL POST OP (GAUZE/BANDAGES/DRESSINGS) ×2 IMPLANT

## 2020-05-29 NOTE — Transfer of Care (Signed)
Immediate Anesthesia Transfer of Care Note  Patient: Kurt Aguilar  Procedure(s) Performed: Procedure(s): LEFT TOTAL KNEE ARTHROPLASTY (Left)  Patient Location: PACU  Anesthesia Type:Spinal  Level of Consciousness:  sedated, patient cooperative and responds to stimulation  Airway & Oxygen Therapy:Patient Spontanous Breathing and Patient connected to face mask oxgen  Post-op Assessment:  Report given to PACU RN and Post -op Vital signs reviewed and stable  Post vital signs:  Reviewed and stable  Last Vitals:  Vitals:   05/29/20 1146 05/29/20 1147  BP: (!) 191/92   Pulse: 65 60  Resp: 15 13  Temp:    SpO2: 91% 504%    Complications: No apparent anesthesia complications

## 2020-05-29 NOTE — Anesthesia Postprocedure Evaluation (Signed)
Anesthesia Post Note  Patient: Kurt Aguilar  Procedure(s) Performed: LEFT TOTAL KNEE ARTHROPLASTY (Left Knee)     Patient location during evaluation: PACU Anesthesia Type: Spinal Level of consciousness: oriented and awake and alert Pain management: pain level controlled Vital Signs Assessment: post-procedure vital signs reviewed and stable Respiratory status: spontaneous breathing, respiratory function stable and patient connected to nasal cannula oxygen Cardiovascular status: blood pressure returned to baseline and stable Postop Assessment: no headache, no backache and no apparent nausea or vomiting Anesthetic complications: no   No complications documented.  Last Vitals:  Vitals:   05/29/20 1515 05/29/20 1530  BP: 133/65 124/68  Pulse: (!) 57 (!) 54  Resp:    Temp:    SpO2: 100% 100%    Last Pain:  Vitals:   05/29/20 1530  TempSrc:   PainSc: 0-No pain                 Brogan England S

## 2020-05-29 NOTE — Evaluation (Signed)
Physical Therapy Evaluation Patient Details Name: Kurt Aguilar MRN: [id number] DOB: June 03, 1946 Today's Date: 05/29/2020   History of Present Illness  pt s/p L TKA 05/29/2020 and with h/o CAD, CABGx5 in 1998, R knee ligamnet repair and back pain.  Clinical Impression  Pt s/p TKA presents with some ROM and muscle deficits, however performed fairly well today post surgery day with ability to SLR and mobilize with supervision and safest to DC home with pt's wife assist today. Educated on HEP for TKA and how to progress, ice and position his knee over next few days until HHPT can begin working with pt. No further acute PT needed at this time.      Follow Up Recommendations Follow surgeon's recommendation for DC plan and follow-up therapies (MD note and pt states they are supposed to have HHPT set up)    Equipment Recommendations  Rolling walker with 5" wheels    Recommendations for Other Services       Precautions / Restrictions Precautions Precautions: Knee Precaution Comments: dicussed and demonstarted proper resting postiions with the Knee and roll to prevent external rotation and knee flexion Restrictions Weight Bearing Restrictions: No Other Position/Activity Restrictions: WBAT LLE      Mobility  Bed Mobility Overal bed mobility: Modified Independent             General bed mobility comments: got on and off bed with no assistance , just increased time and he used his UEs a little to assist the LE  Transfers Overall transfer level: Needs assistance Equipment used: Rolling walker (2 wheeled) Transfers: Sit to/from Stand Sit to Stand: Min guard         General transfer comment: cues for saefty with RW and performed very well  Ambulation/Gait Ambulation/Gait assistance: Min guard Gait Distance (Feet): 100 Feet Assistive device: Rolling walker (2 wheeled) Gait Pattern/deviations: Step-to pattern Gait velocity: slow, intintially by PT pacing slow today first time  up after surgery   General Gait Details: educated with step to pattern for safety for tonight and tomoorw as RLE wakes up and performs properly, then transition to step through pattern  Stairs            Wheelchair Mobility    Modified Rankin (Stroke Patients Only)       Balance Overall balance assessment: Modified Independent;No apparent balance deficits (not formally assessed)                                           Pertinent Vitals/Pain Pain Assessment: 0-10 Pain Score: 2  Pain Location: jsut starting to get some pain p/o in knee area Pain Descriptors / Indicators: Aching Pain Intervention(s): Monitored during session;Ice applied    Home Living Family/patient expects to be discharged to:: Private residence Living Arrangements: Spouse/significant other Available Help at Discharge: Family Type of Home: [facility] [place] Home Access: Level entry     Home Layout: One [place]: Kurt Aguilar - single point      Prior Function Level of Independence: Independent         Comments: had to use cane for the last few months due to back pain and R knee pain. Enjoys being active, water aerobics and the gym at [facility]        Extremity/Trunk Assessment  Lower Extremity Assessment Lower Extremity Assessment: Overall WFL for tasks assessed (LLE able to perform SLR independently and knee flexion however still no sensation in gential area/ urine incontenenice during session)       Communication   Communication: No difficulties  Cognition Arousal/Alertness: Awake/alert Behavior During Therapy: WFL for tasks assessed/performed Overall Cognitive Status: Within Functional Limits for tasks assessed                                        General Comments General comments (skin integrity, edema, etc.): used RW for p/o not balance deficits noticed with basic  transfer and walkig with RW a tthis time    Exercises Total Joint Exercises Ankle Circles/Pumps: AROM;Left;10 reps;Supine Quad Sets: AROM;Supine;Left;5 reps Short Arc QuadSinclair Aguilar;Left;5 reps;Supine Heel Slides: AROM;Left;5 reps;Supine Hip ABduction/ADduction: AROM;Left;Supine;5 reps Straight Leg Raises: AROM;Supine;Left;5 reps Long Arc Quad: AROM;Left;Seated;5 reps Goniometric ROM: 0-90 supine AROM   Assessment/Plan    PT Assessment All further PT needs can be met in the next venue of care (pt's MD states getting HHPT)  PT Problem List Decreased strength;Decreased range of motion;Decreased activity tolerance;Decreased mobility       PT Treatment Interventions      PT Goals (Current goals can be found in the Care Plan section)  Acute Rehab PT Goals Patient Stated Goal: I want to get active again PT Goal Formulation: All assessment and education complete, DC therapy    Frequency     Barriers to discharge        Co-evaluation               AM-PAC PT "6 Clicks" Mobility  Outcome Measure Help needed turning from your back to your side while in a flat bed without using bedrails?: None Help needed moving from lying on your back to sitting on the side of a flat bed without using bedrails?: None Help needed moving to and from a bed to a chair (including a wheelchair)?: A Little Help needed standing up from a chair using your arms (e.g., wheelchair or bedside chair)?: A Little Help needed to walk in hospital room?: A Little Help needed climbing 3-5 steps with a railing? : A Little 6 Click Score: 20    End of Session Equipment Utilized During Treatment: Gait belt Activity Tolerance: Patient tolerated treatment well Patient left: in bed;with nursing/sitter in room Nurse Communication: Mobility status PT Visit Diagnosis: Other abnormalities of gait and mobility (R26.89)    Time: 1610-9604 PT Time Calculation (min) (ACUTE ONLY): 45 min   Charges:   PT Evaluation $PT  Eval Low Complexity: 1 Low PT Treatments $Gait Training: 8-22 mins $Therapeutic Exercise: 8-22 mins        Kurt Aguilar, PT, MPT Acute Rehabilitation Services Office: [phone] Pager: [phone] 05/29/2020   Kurt Aguilar 05/29/2020, 6:32 PM

## 2020-05-29 NOTE — Progress Notes (Signed)
Spoke with Dr. Kalman Shan about patient's allergy to Oxycodone-acetaminophin and the PRN Oxy IR that is ordered for PACU. Dr. Kalman Shan stated he is aware and it is ok for administration. Dr. Kalman Shan also aware of and aprroves of Norco Rx for home pain control.

## 2020-05-29 NOTE — Progress Notes (Signed)
Assisted Dr. Lissa Hoard with left, ultrasound guided, adductor canal block. Side rails up, monitors on throughout procedure. See vital signs in flow sheet. Tolerated Procedure well.

## 2020-05-29 NOTE — Anesthesia Preprocedure Evaluation (Addendum)
Anesthesia Evaluation  Patient identified by MRN, date of birth, ID band Patient awake    Reviewed: Allergy & Precautions, NPO status , Patient's Chart, lab work & pertinent test results  Airway Mallampati: II  TM Distance: >3 FB Neck ROM: Full    Dental  (+) Teeth Intact, Dental Advisory Given   Pulmonary sleep apnea and Continuous Positive Airway Pressure Ventilation , pneumonia, former smoker,    Pulmonary exam normal breath sounds clear to auscultation       Cardiovascular hypertension, Pt. on home beta blockers and Pt. on medications + angina + CAD, + Past MI and + CABG  Normal cardiovascular exam+ dysrhythmias + Valvular Problems/Murmurs  Rhythm:Regular Rate:Normal     Neuro/Psych negative neurological ROS  negative psych ROS   GI/Hepatic Neg liver ROS, GERD  ,  Endo/Other  negative endocrine ROSdiabetes  Renal/GU Renal disease  negative genitourinary   Musculoskeletal  (+) Arthritis ,   Abdominal   Peds negative pediatric ROS (+)  Hematology negative hematology ROS (+)   Anesthesia Other Findings   Reproductive/Obstetrics negative OB ROS                           Anesthesia Physical Anesthesia Plan  ASA: III  Anesthesia Plan: Spinal   Post-op Pain Management:  Regional for Post-op pain   Induction: Intravenous  PONV Risk Score and Plan: 2 and Propofol infusion, TIVA, Treatment may vary due to age or medical condition and Ondansetron  Airway Management Planned: Simple Face Mask  Additional Equipment: None  Intra-op Plan:   Post-operative Plan:   Informed Consent: I have reviewed the patients History and Physical, chart, labs and discussed the procedure including the risks, benefits and alternatives for the proposed anesthesia with the patient or authorized representative who has indicated his/her understanding and acceptance.     Dental advisory given  Plan  Discussed with: CRNA  Anesthesia Plan Comments:        Anesthesia Quick Evaluation

## 2020-05-29 NOTE — Anesthesia Procedure Notes (Signed)
Anesthesia Regional Block: Adductor canal block   Pre-Anesthetic Checklist: ,, timeout performed, Correct Patient, Correct Site, Correct Laterality, Correct Procedure, Correct Position, site marked, Risks and benefits discussed,  Surgical consent,  Pre-op evaluation,  At surgeon's request and post-op pain management  Laterality: Left  Prep: chloraprep       Needles:  Injection technique: Single-shot  Needle Type: Stimiplex     Needle Length: 9cm  Needle Gauge: 21     Additional Needles:   Procedures:,,,, ultrasound used (permanent image in chart),,,,  Narrative:  Start time: 05/29/2020 11:40 AM End time: 05/29/2020 11:44 AM Injection made incrementally with aspirations every 5 mL.  Performed by: Personally  Anesthesiologist: Nolon Nations, MD  Additional Notes: BP cuff, EKG monitors applied. Sedation begun. Artery and nerve location verified with U/S and anesthetic injected incrementally, slowly, and after negative aspirations under direct u/s guidance. Good fascial /perineural spread. Tolerated well.

## 2020-05-29 NOTE — Anesthesia Procedure Notes (Signed)
Spinal  Patient location during procedure: OR Start time: 05/29/2020 11:53 AM End time: 05/29/2020 11:57 AM Staffing Anesthesiologist: Nolon Nations, MD Preanesthetic Checklist Completed: patient identified, IV checked, site marked, risks and benefits discussed, surgical consent, monitors and equipment checked, pre-op evaluation and timeout performed Spinal Block Patient position: sitting Prep: DuraPrep and site prepped and draped Patient monitoring: heart rate, cardiac monitor, continuous pulse ox and blood pressure Approach: right paramedian Location: L3-4 Injection technique: single-shot Needle Needle type: Sprotte  Needle gauge: 24 G Needle length: 9 cm Assessment Sensory level: T4

## 2020-05-29 NOTE — Interval H&P Note (Signed)
History and Physical Interval Note:  05/29/2020 11:15 AM  Kurt Aguilar  has presented today for surgery, with the diagnosis of LEFT KNEE DEGENERATIVE JOINT DISEASE.  The various methods of treatment have been discussed with the patient and family. After consideration of risks, benefits and other options for treatment, the patient has consented to  Procedure(s): LEFT TOTAL KNEE ARTHROPLASTY (Left) as a surgical intervention.  The patient's history has been reviewed, patient examined, no change in status, stable for surgery.  I have reviewed the patient's chart and labs.  Questions were answered to the patient's satisfaction.     Hessie Dibble

## 2020-05-29 NOTE — Op Note (Signed)
PREOP DIAGNOSIS: DJD LEFT KNEE POSTOP DIAGNOSIS:  same PROCEDURE: LEFT TKR ANESTHESIA: Spinal and MAC ATTENDING SURGEON: Hessie Dibble ASSISTANT: Loni Dolly PA  INDICATIONS FOR PROCEDURE: Kurt Aguilar is a 74 y.o. male who has struggled for a long time with pain due to degenerative arthritis of the left knee.  The patient has failed many conservative non-operative measures and at this point has pain which limits the ability to sleep and walk.  The patient is offered total knee replacement.  Informed operative consent was obtained after discussion of possible risks of anesthesia, infection, neurovascular injury, DVT, and death.  The importance of the post-operative rehabilitation protocol to optimize result was stressed extensively with the patient.  SUMMARY OF FINDINGS AND PROCEDURE:  [name] was taken to the operative suite where under the above anesthesia a left knee replacement was performed.  There were advanced degenerative changes and the bone quality was excellent.  We used the DePuyAttune system and placed size 6 femur, 7 tibia, 38 mm all polyethylene patella, and a size 10 mm spacer.  Loni Dolly PA-C assisted throughout and was invaluable to the completion of the case in that he helped retract and maintain exposure while I placed the components.  He also helped close thereby minimizing OR time.  The patient was admitted for appropriate post-op care to include perioperative antibiotics and mechanical and pharmacologic measures for DVT prophylaxis.  DESCRIPTION OF PROCEDURE:  [name] was taken to the operative suite where the above anesthesia was applied.  The patient was positioned supine and prepped and draped in normal sterile fashion.  An appropriate time out was performed.  After the administration of kefzol pre-op antibiotic the leg was elevated and exsanguinated and a tourniquet inflated.  A standard longitudinal incision was made on the anterior knee.  Dissection  was carried down to the extensor mechanism.  All appropriate anti-infective measures were used including the pre-operative antibiotic, betadine impregnated drape, and closed hooded exhaust systems for each member of the surgical team.  A medial parapatellar incision was made in the extensor mechanism and the knee cap flipped and the knee flexed.  Some residual meniscal tissues were removed along with any remaining ACL/PCL tissue.  A guide was placed on the tibia and a flat cut was made on it's superior surface.  An intramedullary guide was placed in the femur and was utilized to make anterior and posterior cuts creating an appropriate flexion gap.  A second intramedullary guide was placed in the femur to make a distal cut properly balancing the knee with an extension gap equal to the flexion gap.  The three bones sized to the above mentioned sizes and the appropriate guides were placed and utilized.  A trial reduction was done and the knee easily came to full extension and the patella tracked well on flexion.  The trial components were removed and all bones were cleaned with pulsatile lavage and then dried thoroughly.  Cement was mixed and was pressurized onto the bones followed by placement of the aforementioned components.  Excess cement was trimmed and pressure was held on the components until the cement had hardened.  The tourniquet was deflated and a small amount of bleeding was controlled with cautery and pressure.  The knee was irrigated thoroughly.  The extensor mechanism was re-approximated with #1 ethibond in interrupted fashion.  The knee was flexed and the repair was solid.  The subcutaneous tissues were re-approximated with #0 and #2-0 vicryl and the skin  closed with a subcuticular stitch and steristrips.  A sterile dressing was applied.  Intraoperative fluids, EBL, and tourniquet time can be obtained from anesthesia records.  DISPOSITION:  The patient was taken to recovery room in stable condition  and admitted for appropriate post-op care to include peri-operative antibiotic and DVT prophylaxis with mechanical and pharmacologic measures.  Hessie Dibble 05/29/2020, 1:51 PM

## 2020-05-30 ENCOUNTER — Encounter (HOSPITAL_COMMUNITY): Payer: Self-pay | Admitting: Orthopaedic Surgery

## 2020-06-01 DIAGNOSIS — M545 Low back pain: Secondary | ICD-10-CM | POA: Diagnosis not present

## 2020-06-07 DIAGNOSIS — G4733 Obstructive sleep apnea (adult) (pediatric): Secondary | ICD-10-CM | POA: Diagnosis not present

## 2020-06-08 DIAGNOSIS — M6281 Muscle weakness (generalized): Secondary | ICD-10-CM | POA: Diagnosis not present

## 2020-06-08 DIAGNOSIS — Z96652 Presence of left artificial knee joint: Secondary | ICD-10-CM | POA: Diagnosis not present

## 2020-06-08 DIAGNOSIS — R2689 Other abnormalities of gait and mobility: Secondary | ICD-10-CM | POA: Diagnosis not present

## 2020-06-09 ENCOUNTER — Other Ambulatory Visit: Payer: Self-pay | Admitting: Family Medicine

## 2020-06-13 DIAGNOSIS — Z9889 Other specified postprocedural states: Secondary | ICD-10-CM | POA: Diagnosis not present

## 2020-06-13 DIAGNOSIS — Z96652 Presence of left artificial knee joint: Secondary | ICD-10-CM | POA: Diagnosis not present

## 2020-06-15 ENCOUNTER — Encounter: Payer: Self-pay | Admitting: Internal Medicine

## 2020-06-15 ENCOUNTER — Other Ambulatory Visit: Payer: Self-pay

## 2020-06-15 ENCOUNTER — Ambulatory Visit: Payer: PPO | Admitting: Internal Medicine

## 2020-06-15 VITALS — BP 130/74 | HR 87 | Temp 97.4°F | Ht 70.5 in | Wt 218.8 lb

## 2020-06-15 DIAGNOSIS — G4733 Obstructive sleep apnea (adult) (pediatric): Secondary | ICD-10-CM

## 2020-06-15 DIAGNOSIS — R918 Other nonspecific abnormal finding of lung field: Secondary | ICD-10-CM | POA: Diagnosis not present

## 2020-06-15 NOTE — Patient Instructions (Signed)
Order- DME [facility]   Please replace old CPAP machine, change o auto 10-20, mask of choice, humidifier, supplies Airview/ card  Please call if we can help

## 2020-06-15 NOTE — Assessment & Plan Note (Signed)
He feels stable off prednisone. Dr [name] has released him. Plan- he will report changes or concerns

## 2020-06-15 NOTE — Assessment & Plan Note (Signed)
Benefits from CPAP. Appropriate to continue. Plan- replace old machine, changing to auto 10-20

## 2020-06-15 NOTE — Progress Notes (Signed)
HPI- male former smoker followed for OSA, bronchitis,Upper Airway Cough, complicated by ZY6/ Hyperlipidemia, CAD/CABG/ RBBB, HTN,  GERD, hx BOOP, BPH,   ----------------------------------------------------------------------------------   05/16/2016-74 year old male former smoker followed for OSA, bronchitis, complicated by DM, CAD/CABG CPAP 17/[facility] FOLLOWS FOR; DME [facility]; Pt states he wears CPAP nightly and DL attached. Pt will need order for new supplies.  Followed recently by Dr. [name] for cough and suspected BOOP He recently got "So Clear" CPAP cleaning device for $300 out of concern that he might get bronchitis from his CPAP machine. Download-adequate compliance 77% 4 hours, good control AHI 4.0/hour. Incidentally uncomfortable today with pain and swelling right elbow he attributes to gout.  06/15/20- 74 year old male former smoker followed for OSA, bronchitis, complicated by DM, CAD/CABG, Obesity,  CPAP 17/[facility] Download compliance 47%, AHI 5.6/ hr Body weight today- 218 lbs Covid vax- 2 Moderna Flu vax- later Had L TKR 9/21- doing well. A lot of short nights in past month post-op discomfort.  Machine is old- discussed replacement with autopap.  BOOP cough doing very well, off prednisone.  ROS-see HPI + = positive Constitutional:   No-   weight loss, night sweats, fevers, chills, fatigue, lassitude. HEENT:   No-  headaches, difficulty swallowing, tooth/dental problems, sore throat,       No-  sneezing, itching, ear ache, nasal congestion, post nasal drip,  CV:  No-   chest pain, orthopnea, PND, swelling in lower extremities, anasarca, dizziness, palpitations Resp: No-   shortness of breath with exertion or at rest.               productive cough,  + non-productive cough,  No- coughing up of blood.              No-   change in color of mucus.  No- wheezing.   Skin: No-   rash or lesions. GI:  No-   heartburn, indigestion, abdominal pain, nausea, vomiting,  GU:  MS:  + joint  pain or swelling. . Neuro-     nothing unusual Psych:  No- change in mood or affect. No depression or anxiety.  No memory loss.  OBJ General- Alert, Oriented, Affect-appropriate, Distress- none acute Skin- rash-none, lesions- none, excoriation- none Lymphadenopathy- none Head- atraumatic            Eyes- Gross vision intact, PERRLA, conjunctivae clear secretions            Ears- Hearing aid+            Nose- Clear, no-Septal dev, mucus, polyps, erosion, perforation             Throat- Mallampati II , mucosa clear , drainage- none, tonsils- atrophic Neck- flexible , trachea midline, no stridor , thyroid nl, carotid no bruit Chest - symmetrical excursion , unlabored           Heart/CV- RRR , no murmur , no gallop  , no rub, nl s1 s2                           - JVD- none , edema- none, stasis changes- none, varices- none           Lung- clear to P&A, wheeze- none, cough+slight dry , dullness-none, rub- none           Chest wall-  Abd- Br/ Gen/ Rectal- Not done, not indicated Extrem- + healing L TKR incision Neuro- grossly intact to observation

## 2020-06-27 DIAGNOSIS — Z96652 Presence of left artificial knee joint: Secondary | ICD-10-CM | POA: Diagnosis not present

## 2020-06-27 DIAGNOSIS — Z9889 Other specified postprocedural states: Secondary | ICD-10-CM | POA: Diagnosis not present

## 2020-07-09 DIAGNOSIS — R2689 Other abnormalities of gait and mobility: Secondary | ICD-10-CM | POA: Diagnosis not present

## 2020-07-09 DIAGNOSIS — Z96652 Presence of left artificial knee joint: Secondary | ICD-10-CM | POA: Diagnosis not present

## 2020-07-09 DIAGNOSIS — M6281 Muscle weakness (generalized): Secondary | ICD-10-CM | POA: Diagnosis not present

## 2020-07-23 ENCOUNTER — Other Ambulatory Visit: Payer: Self-pay | Admitting: Internal Medicine

## 2020-07-30 DIAGNOSIS — M1711 Unilateral primary osteoarthritis, right knee: Secondary | ICD-10-CM | POA: Diagnosis not present
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
## 2020-08-09 ENCOUNTER — Telehealth: Payer: Self-pay | Admitting: Internal Medicine

## 2020-08-09 MED ORDER — PREDNISONE 10 MG PO TABS: ORAL_TABLET | ORAL | 0 refills | Status: DC

## 2020-08-09 MED ORDER — AZITHROMYCIN 250 MG PO TABS: ORAL_TABLET | ORAL | 0 refills | Status: DC

## 2020-08-09 MED ORDER — BENZONATATE 200 MG PO CAPS: ORAL_CAPSULE | ORAL | 0 refills | Status: DC

## 2020-08-09 NOTE — Telephone Encounter (Signed)
Prednisone 10 mg take 4 each am x 2 days, 2 each am x 2 days, 1 each am x 2 days and stop   Zpak   Whenever coughing ,omeprazole 40 mg (20mg x 2) Take 30-60 min before first meal of the day and take pepcid 20 mg (10mg x 2) after supper - then when allbetter ok to resume previous routine    For drainage / throat tickletry take CHLORPHENIRAMINE 4 mg (Chlortab 4mg  at [employer] should be easiest to find in the green box) take one every 4 hours as needed - available over the counter- may cause drowsiness so start with just a bedtime dose or two and see how you tolerate it before trying in daytime    For cough > tessilon 200 mg up to every 6 hours as needed - give #30 and ov in a week if not all better

## 2020-08-09 NOTE — Telephone Encounter (Signed)
Called and spoke to pt. Informed him of the recs per MW. When sending scripts to pharmacy there was a drug to drug interaction with Colchicine and azithromycin. Pt states he isnt taking Colchicine at this time and only takes it during a flare up of his gout. Rx sent to preferred pharmacy. Pt verbalized understanding and denied any further questions or concerns at this time.

## 2020-08-09 NOTE — Telephone Encounter (Signed)
Primary Pulmonologist: Dr. [name] Last office visit and with whom: 06/15/20 with CY and 11/24/19 with MW What do we see them for (pulmonary problems): CY for OSA and MW for upper airway cough  Last OV assessment/plan:  OV with MW for   Assessment & Plan:         Assessment & Plan Note by Tanda Rockers, MD at 11/25/2019 5:31 AM Author: Tanda Rockers, MD Author Type: Physician Filed: 11/25/2019 5:35 AM  Note Status: Bernell List: Cosign Not Required Encounter Date: 11/24/2019  Problem: Upper airway cough syndrome  Editor: Tanda Rockers, MD (Physician)      Prior Versions: 1. Tanda Rockers, MD (Physician) at 11/25/2019 5:32 AM - Written    Recurrent pattern since feb 2017 Trial of gabapentin 100 tid 07/29/2016 >  Still tessalon dep 09/11/2016 so increased to 300 tid and ppi bid ac > not improved 11/11/2016 > rec max gerd rx and sugarless candy > resolved 12/19/2016  And off gabapentin 04/2017  - recurred early dec 2019 while off gabapentin and low dose ppi only  - 09/20/2018 rec max gerd rx and 1st gen H1 blockers per guidelines  And if not better start back on gabapentin  - 11/24/2019 recurred around November 07 2019 on return from car trip to [place] > rx pred/zpak/max gerd rx       Of the three most common causes of  Sub-acute / recurrent or chronic cough, only one (GERD)  can actually contribute to/ trigger  the other two (asthma and post nasal drip syndrome)  and perpetuate the cylce of cough.  While not intuitively obvious, many patients with chronic low grade reflux do not cough until there is a primary insult that disturbs the protective epithelial barrier and exposes sensitive nerve endings.   This is typically viral but can due to PNDS and  either may apply here.     >>>The point is that once this occurs, it is difficult to eliminate the cycle  using anything but a maximally effective acid suppression regimen at least in the short run, accompanied by an appropriate diet to address non  acid GERD and control / eliminate the cough itself with tessalon 200 / eliminate pnds with 1st gen H1 blockers per guidelines, and also added 6 days of Prednisone in case of component of Th-2 driven upper or lower airways inflammation (if cough responds short term only to relapse befor return while will on rx for uacs that would point to allergic rhinitis/ asthma or eos bronchitis)     Added zpak and cautioned not to use with colchicine Advised sugars may transiently rise on pred but monitors regulalry anyway    Assessment & Plan Note by Tanda Rockers, MD at 11/25/2019 5:33 AM Author: Tanda Rockers, MD Author Type: Physician Filed: 11/25/2019 5:33 AM  Note Status: Written Cosign: Cosign Not Required Encounter Date: 11/24/2019  Problem: Pulmonary infiltrates with high ESR c/w BOOP/ idiopathic   Editor: Tanda Rockers, MD (Physician)               Onset of symptoms feb 2017 with "photo neg pumonary edema pattern"  Baseline PFTs 03/18/10  FVC  3.59 (80%) with ERV 67% and dlco 75 corrects to 106  - Allergy profile 12/19/15 >  Eos 0.2 /  IgE  89/ Pos Cat RAST only  12/19/2015  ESR 93 > started on prednisone 20 mg daily  HSP profile 12/19/15 >  Neg  - 01/07/2016 ESR  down to 18 but only 30% improved symptomatically > rec continue 20 mg daily until better then 10 mg daily  - 02/01/2016 rec taper off x 6 weeks then ov > 05/16/2016 ESR = 58> restarted pred 06/03/16  - Collagen Vasc dz screen 07/29/2016  Neg  - PFT's  11/11/2016  FEV1 1.91 (56 % ) ratio 72  p 7 % improvement from saba p nothing prior to study with DLCO  42/43 % corrects to 77 % for alv volume but ERV 35%  - ESR  20 on 10 a/w 15 as  Of 11/11/2016 > rec try 10 mg daily  - Prednisone 5 mg daily as of 12/15/16 > taper to 5 mg qod by 02/10/2017 - 02/10/2017 ESR =  28  rec taper off x 2 weeks > no flare - 04/20/2017 ESR = 32 off pred since 02/2017  - 08/12/18  ESR = 16 with onset of cough   No def as dz/ doubt recurrent boop.         Each  maintenance medication was reviewed in detail including emphasizing most importantly the difference between maintenance and prns and under what circumstances the prns are to be triggered using an action plan format where appropriate.  Total time for H and P, chart review, counseling,  and generating customized AVS unique to this acute extended office visit / charting = 30 min         Patient Instructions by Tanda Rockers, MD at 11/24/2019 2:30 PM Author: Tanda Rockers, MD Author Type: Physician Filed: 11/24/2019 3:12 PM  Note Status: Addendum Cosign: Cosign Not Required Encounter Date: 11/24/2019  Editor: Tanda Rockers, MD (Physician)      Prior Versions: 1. Tanda Rockers, MD (Physician) at 11/24/2019 3:10 PM - Addendum   2. Tanda Rockers, MD (Physician) at 11/24/2019 3:07 PM - Addendum   3. Tanda Rockers, MD (Physician) at 11/24/2019 3:04 PM - Signed    Prednisone 10 mg take  4 each am x 2 days,   2 each am x 2 days,  1 each am x 2 days and stop   Zpak     Whenever coughing , omeprazole 40 mg ($Remo'20mg'qsPsI$   x 2) Take 30-60 min before first meal of the day and take pepcid 20 mg ($Remo'10mg'WfJFh$  x 2)  after supper - then when all  better ok to resume previous routine    For drainage / throat tickle try take CHLORPHENIRAMINE  4 mg  (Chlortab $RemoveBe'4mg'ktUnGNVds$   at [employer] should be easiest to find in the green box)  take one every 4 hours as needed - available over the counter- may cause drowsiness so start with just a bedtime dose or two and see how you tolerate it before trying in daytime    For cough > tessilon 200 mg up to every 6 hours as needed   GERD (REFLUX)  is an extremely common cause of respiratory symptoms just like yours , many times with no obvious heartburn at all.    It can be treated with medication, but also with lifestyle changes including elevation of the head of your bed (ideally with 6 -8inch blocks under the headboard of your bed),  Smoking cessation, avoidance of late  meals, excessive alcohol, and avoid fatty foods, chocolate, peppermint, colas, red wine, and acidic juices such as orange juice.  NO MINT OR MENTHOL PRODUCTS SO NO COUGH DROPS  USE SUGARLESS CANDY INSTEAD (Jolley ranchers or Stover's or Life  Savers) or even ice chips will also do - the key is to swallow to prevent all throat clearing. NO OIL BASED VITAMINS - use powdered substitutes.  Avoid fish oil when coughing.    Please remember to go to the  x-ray department  for your tests - we will call you with the results when they are available    Call if not all better by first of week        Was appointment offered to patient (explain)?  States this is his usual and wants to know what to do should be come in for a visit?    Reason for call: Patient started having chest burning 2-3 days ago, and it has progressively gotten worse with congestion and now a cough. He states it is a dry hacking cough and occurs when he is talking.  Patient is vaccinated and received Booster about 6 weeks ago Patient is taking OTC Mucinex DM twice a day, zinc and Vitamin D & C   Dr. [name] please advise.    Allergies  Allergen Reactions   Oxycodone-Acetaminophen Other (See Comments)    Stops breathing    Immunization History  Administered Date(s) Administered   Fluad Quad(high Dose 65+) 06/25/2019   Influenza Split 07/09/2011   Influenza Whole 05/14/2010, 06/09/2012   Influenza, High Dose Seasonal PF 05/22/2014, 07/04/2015, 05/16/2016, 07/09/2018   Influenza,inj,Quad PF,6+ Mos 07/11/2013   Influenza-Unspecified 07/03/2017   Moderna SARS-COVID-2 Vaccination 11/18/2019, 12/27/2019   Pneumococcal Conjugate-13 08/07/2014   Pneumococcal Polysaccharide-23 09/08/2002, 07/11/2013   Td 09/08/2004, 02/12/2016   Zoster 07/09/2011   Zoster Recombinat (Shingrix) 04/06/2018, 06/17/2018

## 2020-08-15 ENCOUNTER — Encounter: Payer: Self-pay | Admitting: Endocrinology

## 2020-08-15 ENCOUNTER — Other Ambulatory Visit: Payer: Self-pay

## 2020-08-15 ENCOUNTER — Ambulatory Visit: Payer: PPO | Admitting: Endocrinology

## 2020-08-15 VITALS — BP 124/80 | HR 72 | Ht 70.5 in | Wt 216.6 lb

## 2020-08-15 DIAGNOSIS — E1165 Type 2 diabetes mellitus with hyperglycemia: Secondary | ICD-10-CM

## 2020-08-15 LAB — POCT GLYCOSYLATED HEMOGLOBIN (HGB A1C): Hemoglobin A1C: 6.5 % — AB (ref 4.0–5.6)

## 2020-08-15 NOTE — Patient Instructions (Addendum)
Please continue the same Invokana.  However, if your kidneys get any worse, we'll have to change this. If you go back on steroids, check your blood sugar and call if it is over 200.   Please come back for a follow-up appointment in 6 months.

## 2020-08-15 NOTE — Progress Notes (Signed)
Subjective:    Patient ID: Kurt Aguilar, male    DOB: [birth year], 74 y.o.   MRN: [id number]  HPI Pt returns for f/u of diabetes mellitus: DM type: 2   Dx'ed: 1224 Complications: polyneuropathy, stage 3b CRI, and CAD.   Therapy: Invokana DKA: never.  Severe hypoglycemia: never.  Pancreatitis: never.  Other: he takes intermitt prednisone for BOOP; renal insuff limits rx options; he took insulin 2018-2019.   Interval history: main symptom is back pain, but no recent steroids.  He says cbg's are well-controlled.  He is back on prednisone.   Past Medical History:  Diagnosis Date  . Arthritis   . Chronic kidney disease   . Complication of anesthesia    constipation and inability to  urinate happened a few days after cataract surgery   . Diabetes ([place])    type 2   . Diverticulitis   . GERD (gastroesophageal reflux disease)   . Gout   . Heart attack ([place])   . Heart disease   . Heart murmur   . Hyperglycemia   . Hyperlipidemia   . Hypertension   . Osteoporosis   . Pneumonia    hx of BOOP followed by Dr [name]   . Sleep apnea    cpapp - setting at 15     Past Surgical History:  Procedure Laterality Date  . ACROMIO-CLAVICULAR JOINT REPAIR Left 05/05/2019   Procedure: SHOULDER ARTHROSCOPIC ASSISTED ACROMIO-CLAVICULAR JOINT REPAIR;  Surgeon: Tania Ade, MD;  Location: [facility];  Service: Orthopedics;  Laterality: Left;  . CATARACT EXTRACTION Bilateral   . CORONARY ARTERY BYPASS GRAFT     [place]  . open heart surgery  09/09/1995-1998   5 bypass  . REPAIR KNEE LIGAMENT     right  . SHOULDER ARTHROSCOPY Left 05/05/2019   Procedure: ARTHROSCOPY SHOULDER;  Surgeon: Tania Ade, MD;  Location: [facility];  Service: Orthopedics;  Laterality: Left;  . TONSILLECTOMY    . TOTAL KNEE ARTHROPLASTY Left 05/29/2020   Procedure: LEFT TOTAL KNEE ARTHROPLASTY;  Surgeon: Melrose Nakayama, MD;  Location: [facility];  Service: Orthopedics;  Laterality: Left;    Social History    Socioeconomic History  . Marital status: Married    Spouse name: Not on file  . Number of children: 4  . Years of education: 73  . Highest education level: Not on file  Occupational History  . Occupation: Armed forces technical officer: RETIRED  Tobacco Use  . Smoking status: Former Smoker    Packs/day: 1.00    Years: 3.00    Pack years: 3.00    Types: Cigars    Quit date: 11/25/2004    Years since quitting: 15.7  . Smokeless tobacco: Never Used  . Tobacco comment: occas. cigar quit 2006  Vaping Use  . Vaping Use: Never used  Substance and Sexual Activity  . Alcohol use: Yes    Alcohol/week: 1.0 standard drink    Types: 1 Standard drinks or equivalent per week    Comment: rare  . Drug use: No  . Sexual activity: Never  Other Topics Concern  . Not on file  Social History Narrative  . Not on file   Social Determinants of Health   Financial Resource Strain: Low Risk   . Difficulty of Paying Living Expenses: Not hard at all  Food Insecurity: No Food Insecurity  . Worried About Charity fundraiser in the Last Year: Never true  . Ran Out of Food in the  Last Year: Never true  Transportation Needs: No Transportation Needs  . Lack of Transportation (Medical): No  . Lack of Transportation (Non-Medical): No  Physical Activity: Sufficiently Active  . Days of Exercise per Week: 7 days  . Minutes of Exercise per Session: 60 min  Stress: No Stress Concern Present  . Feeling of Stress : Not at all  Social Connections: Not on file  Intimate Partner Violence: Not At Risk  . Fear of Current or Ex-Partner: No  . Emotionally Abused: No  . Physically Abused: No  . Sexually Abused: No    Current Outpatient Medications on File Prior to Visit  Medication Sig Dispense Refill  . allopurinol (ZYLOPRIM) 100 MG tablet TAKE 1 TABLET BY MOUTH TWICE A DAY 180 tablet 0  . aspirin 81 MG tablet Take 1 tablet (81 mg total) by mouth 2 (two) times daily after a meal. 30 tablet 0  . azithromycin  (ZITHROMAX Z-PAK) 250 MG tablet Take as directed. 6 each 0  . benzonatate (TESSALON) 200 MG capsule Take up to every 6 hours as needed 30 capsule 0  . Bioflavonoid Products (VITAMIN C) CHEW Chew 1 tablet by mouth daily.    . Blood Glucose Monitoring Suppl (FREESTYLE LITE) DEVI 1 each by Does not apply route 4 (four) times daily. E11.65 1 each 0  . canagliflozin (INVOKANA) 100 MG TABS tablet Take 1 tablet (100 mg total) by mouth every other day. (Patient taking differently: Take 50 mg by mouth daily. ) 30 tablet 5  . Carboxymethylcellul-Glycerin (LUBRICATING EYE DROPS OP) Place 1 drop into both eyes daily as needed (dry eyes).    . Cholecalciferol (VITAMIN D3 PO) Take 1 capsule by mouth daily.    Marland Kitchen CINNAMON PO Take 1,000 mg by mouth 2 (two) times daily.    . colchicine 0.6 MG tablet TAKE 1 TABLET BY MOUTH TWICE A DAY WITH A MEAL AS NEEDED FOR ACUTE FLARE OF GOUT (Patient taking differently: Take 0.6 mg by mouth 2 (two) times daily as needed (gout flare). ) 45 tablet 3  . Dextromethorphan-guaiFENesin (MUCINEX DM MAXIMUM STRENGTH) 60-1200 MG TB12 Take 1 tablet by mouth 2 (two) times daily as needed (congestion).     . ezetimibe (ZETIA) 10 MG tablet Take 1 tablet (10 mg total) by mouth daily. 90 tablet 3  . famotidine (PEPCID) 20 MG tablet TAKE 1 TABLET BY MOUTH EVERYDAY AT BEDTIME (Patient taking differently: Take 20 mg by mouth at bedtime. ) 90 tablet 1  . Glucosamine HCl (GLUCOSAMINE PO) Take 1,500 mg by mouth 2 (two) times daily.     Marland Kitchen glucose blood (FREESTYLE LITE) test strip 1 each by Other route 4 (four) times daily. E11.65 400 strip 0  . HYDROcodone-acetaminophen (NORCO) 5-325 MG tablet Take 1-2 tablets by mouth every 6 (six) hours as needed for moderate pain or severe pain (post op pain). With food 40 tablet 0  . Lancets (FREESTYLE) lancets 1 each by Other route 4 (four) times daily. E11.65 400 each 0  . Lutein 20 MG CAPS Take 20 mg by mouth daily.     . methocarbamol (ROBAXIN) 500 MG tablet  Take 1 tablet (500 mg total) by mouth every 8 (eight) hours as needed for muscle spasms. Caution of sedation 40 tablet 1  . metoprolol tartrate (LOPRESSOR) 25 MG tablet Take 0.5 tablets (12.5 mg total) by mouth daily. 45 tablet 3  . Multiple Vitamins-Minerals (ZINC PO) Take 1 tablet by mouth daily.    Marland Kitchen omeprazole (PRILOSEC) 20  MG capsule TAKE 2 CAPSULES (40 MG TOTAL) BY MOUTH DAILY BEFORE BREAKFAST. 180 capsule 3  . ondansetron (ZOFRAN) 8 MG tablet Take 1 tablet (8 mg total) by mouth every 8 (eight) hours as needed for nausea or vomiting. From narcotic pain medicine 15 tablet 0  . predniSONE (DELTASONE) 10 MG tablet Take 4 tabs x 2 days, then 2 tabs x 2 days, then 1 tab x 2 days, then stop. 14 tablet 0  . rosuvastatin (CRESTOR) 40 MG tablet Take 1 tablet (40 mg total) by mouth daily. 90 tablet 3   Current Facility-Administered Medications on File Prior to Visit  Medication Dose Route Frequency Provider Last Rate Last Admin  . betamethasone acetate-betamethasone sodium phosphate (CELESTONE) injection 3 mg  3 mg Intramuscular Once Edrick Kins, DPM        Allergies  Allergen Reactions  . Oxycodone-Acetaminophen Other (See Comments)    Stops breathing    Family History  Problem Relation Age of Onset  . Heart disease Mother   . Heart disease Father   . Stroke Father   . Heart attack Son 74  . Kidney disease Neg Hx   . Prostate cancer Neg Hx   . Diabetes Neg Hx     BP 124/80   Pulse 72   Ht 5' 10.5" (1.791 m)   Wt 216 lb 9.6 oz (98.2 kg)   SpO2 97%   BMI 30.64 kg/m    Review of Systems     Objective:   Physical Exam VITAL SIGNS:  See vs page GENERAL: no distress Pulses: dorsalis pedis intact bilat.   MSK: no deformity of the feet.   CV: 1+ bilat leg edema.   Skin:  no ulcer on the feet.  normal color and temp on the feet.  Old healed surgical scars on both legs (vein harvest) Neuro: sensation is intact to touch on the feet.   A1c=6.5%  Lab Results  Component Value  Date   CREATININE 1.89 (H) 05/17/2020   BUN 40 (H) 05/17/2020   NA 141 05/17/2020   K 4.6 05/17/2020   CL 104 05/17/2020   CO2 25 05/17/2020        Assessment & Plan:  Type 2 DM: well-controlled.  stage 3 CRI: we discussed the fact that we may have to abandon this rx.   Patient Instructions  Please continue the same Invokana.  However, if your kidneys get any worse, we'll have to change this. If you go back on steroids, check your blood sugar and call if it is over 200.   Please come back for a follow-up appointment in 6 months.

## 2020-08-23 ENCOUNTER — Other Ambulatory Visit: Payer: Self-pay | Admitting: Endocrinology

## 2020-08-23 DIAGNOSIS — E1165 Type 2 diabetes mellitus with hyperglycemia: Secondary | ICD-10-CM

## 2020-08-27 ENCOUNTER — Other Ambulatory Visit: Payer: Self-pay | Admitting: Endocrinology

## 2020-08-30 ENCOUNTER — Encounter: Payer: Self-pay | Admitting: Internal Medicine

## 2020-08-30 ENCOUNTER — Other Ambulatory Visit: Payer: Self-pay

## 2020-08-30 ENCOUNTER — Ambulatory Visit (INDEPENDENT_AMBULATORY_CARE_PROVIDER_SITE_OTHER): Payer: PPO | Admitting: Internal Medicine

## 2020-08-30 ENCOUNTER — Telehealth: Payer: Self-pay | Admitting: Internal Medicine

## 2020-08-30 DIAGNOSIS — R918 Other nonspecific abnormal finding of lung field: Secondary | ICD-10-CM

## 2020-08-30 MED ORDER — PREDNISONE 10 MG PO TABS: ORAL_TABLET | ORAL | 0 refills | Status: DC

## 2020-08-30 NOTE — Progress Notes (Signed)
Subjective:    Patient ID: Kurt Aguilar, male    DOB: 06/25/1946,   MRN: [id number]    Brief patient profile:  46 yowm never cigarette smoker(just some cigars)  after cabg in [place] started noting recurrent winter cough regardless of where located in [place] ([place]/ [place]) with typical acute episode in [place] in Feb 2017  This  cough  more dry less severe than previous >  abx and pred > improved but did not resolve and worse when stopped it so restarted pred/abx > dx as pneumonia with variable as dz > referred to pulmonary clinic 12/19/2015 by [name] with 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   History of Present Illness  12/19/2015 1st [facility] office visit/ Riverlyn Kizziah   Chief Complaint  Patient presents with  . Pulmonary Consult    Referred by [name]. Pt c/o cough x 4 wks- non prod and worse in the evening and when he lies down. Talking and exertion are things that trigger the cough. He has been on pred x 2 and both times cough resolved and immediately returns once done with med. He also c/o SOB- gets winded just walking from room to room at home.   while on prednisone still some weak and sob but better cough and last dose at least one week and worse overall since off it assoc with sob room to room and weakness/ excess/ purulent sputum or mucus plugs / no unusual exp / no ctd.  Has new CPAP machine fall 2016  rec  Omeprazole 20 mg Take 30- 60 min before your first and last meals of the day  Prednisone 10  X 2 each day until 100% better then 10 mg daily  Dx is Brochilitis obliterans with organizing pneumonia vs eosiniphic pneumonia most likely diagnosis GERD  Diet       02/01/2016  f/u ov/Odilon Cass re: boop tapered pred to 10 mg as 01/28/16  Chief Complaint  Patient presents with  . Follow-up    pt states he is much improved since last office visit.  SOB has resolved.    Not limited by breathing from desired activities / concerned about wt gain from pred  rec Prednisone  10 mg with bfast x 2 week then 5 mg x 2 weeks then 5 mg even days x 2 weeks and stop  If breathing/cough worse or nausea resume previous dose    09/11/2016  f/u ov/Desree Leap re: BOOP/uacs  on pred 10/5  prilosec 20 mg ac and hs  Chief Complaint  Patient presents with  . Follow-up    Increased SOB for the past 3-4 days. He gets SOB with exertion such as getting dressed or walking accross the room.  He has also started coughing more "feels like I burned my lungs"- prod with white sputum.     was doing ok on 10/5 in terms of breathing but worse gradually x one week p several weeks on the 10/5 dose  Cough some better but still needing freq tessalon on gabapentin 100 tid  rec Prednisone 10 mg  2 daily until better then 1 daily x 2 weeks then 10 alternating with 5 until return Prilosec should be 20 mg Take 30- 60 min before your first and last meals of the day  Increase gabapentin to 300 mg three times daily   .       02/10/2017  f/u ov/Abrial Arrighi re:  PF / boop down to 10 mg one half qod and maint on gabapentin  300 tid for UACS Chief Complaint  Patient presents with  . Follow-up    Pt states "doing great"- doing well on the pred 5 mg every other day.    no change ex tol with taper / no cough/ no nausea even on days off  rec Prednisone 5mg  one half every other day x 2 weeks and then stop -      04/15/2017  f/u ov/Terrace Fontanilla re:  PF/ boop / ? Irritable larynx  Chief Complaint  Patient presents with  . Follow-up    Pt states developed a cold mid July 2018 while traveling to [place]. He is feeling better now and not coughing much. No new co's today.   walked all over [place] up hills/ in heat no problem with sob   Still occ throat clearing better on gabapentin 300 tid and gerd rx / daytime only  rec Once the protonix runs out,  Ok to try omeprazole 20 mg before bfrast and pepcid at bedtime until you see [name]  Once you run out of gabapentin ok to leave it off and call if refills needed      08/12/18  NP  cough? Uri  Augmentin 875mg  Twice daily  For 1 week , take w/ food .  Mucinex DM Twice daily  As needed  Cough/congestion .  Tessalon Three times a day  As needed  Cough .  Begin Prednisone 20mg  daily , take with food.  Chest xray  ok      08/26/2018  f/u ov/Yue Flanigan re: flare of cough related to uri, much better  Chief Complaint  Patient presents with  . Follow-up    Cough is much improved. He very rarely uses his albuterol inhaler.   Dyspnea:  Pool x 2.5 h Cough: gone/ still has urge to clear to clear throat with sense of throat and chest "congestion" but no mucus Sleeping: ok / adjustable x 8 in hob rec  Prednisone  10 mg take a half x 3 days and stop  Whenever coughing , omeprazole 40 mg Take 30-60 min before first meal of the day and take pepcid 20 mg after supper (if not improving then omeprazole should be:  Take 30-60 min before first meal of the day ) For drainage / throat tickle try take CHLORPHENIRAMINE  4 mg - take one every 4 hours as needed - available over the counter- may cause drowsiness so start with just a bedtime dose or two and see how you tolerate it before trying in daytime    09/20/2018  f/u ov/Jenette Rayson re: uacs flare early dec 2019 - no longer on gabapentin and only using 20 mg ppi/ 10 mg pepcid  Chief Complaint  Patient presents with  . Acute Visit    Cough flared back up approx 2 wks ago, but has been getting better. He is not producing any sputum.   Dyspnea:  Water aerobics tol fine   Cough: esp in am but dry Sleeping: elevate hob only/ on cpap   Some sense of pnds not responding to zyrtec  rec Whenever coughing , omeprazole 40 mg (20 x 2) Take 30-60 min before first meal of the day and take pepcid 20 mg (10 x 2)  after supper - then when better ok to resume previous routine  For drainage / throat tickle try take CHLORPHENIRAMINE  4 mg -Chlortabs at [facility])  take one every 4 hours   11/24/2019  Acute extended  ov/Mirren Gest re: did fine for over a  year omeprazole  20mg  ac only then 2 weeks prior to OV on arrival from [place] Onset 6 pm cough s assoc obvious sniffles or obvious pnds but more severe cough then covid shot 1 week prior to OV  And worse with more severe cough and sob and throat burning and mucus turing a little yellow  Chief Complaint  Patient presents with  . Acute Visit    Pt c/o cough and "chest burning" for the past wk. He also c/o increased SOB. Cough is occ prod with clear to yellow sputum.    Dyspnea:  With activity whereas previously not limited including hills  Cough: esp after supper slt more productive  Sleeping: on cpap / ? Worse noct hb > just started pepcid hs   SABA use: none  02: none  rec Prednisone 10 mg take  4 each am x 2 days,   2 each am x 2 days,  1 each am x 2 days and stop  Zpak   Whenever coughing , omeprazole 40 mg (20mg   x 2) Take 30-60 min before first meal of the day and take pepcid 20 mg (10mg  x 2)  after supper - then when all  better ok to resume previous routine  For drainage / throat tickle try take CHLORPHENIRAMINE  4 mg  For cough > tessilon 200 mg up to every 6 hours as needed  GERD  Diet   08/09/20 recurrent cough > rec repeat last instructions helped some        Virtual Visit via Telephone Note 08/30/2020 Acute visit vaccinated and boosted  I connected with [name] on 08/30/20 at 10:30 AM EST by telephone and verified that I am speaking with the correct person using two identifiers. Pt is at home and this call made from my office with no other participants    I discussed the limitations, risks, security and privacy concerns of performing an evaluation and management service by telephone and the availability of in person appointments. I also discussed with the patient that there may be a patient responsible charge related to this service. The patient expressed understanding and agreed to proceed.   History of Present Illness: No longer prednisone, cough back x 3 days esp after supper  if talks a lot  maint omeprazole 20 x 2 and pepcid 20 mg after supper  Dyspnea: more so now with walking than it was  Cough: min mucoid  Sleeping: x 10 min at hs coughs then sleeps fine @ 20 degrees SABA use: none 02: none  Using tessalon 200 tid    No obvious day to day or daytime variability or assoc excess/ purulent sputum or mucus plugs or hemoptysis or cp or chest tightness, subjective wheeze or overt sinus or hb symptoms.    Also denies any obvious fluctuation of symptoms with weather or environmental changes or other aggravating or alleviating factors except as outlined above.   Meds reviewed/ med reconciliation completed        Observations/Objective: slt hoarse, dry spont cough s conversational sob    Assessment and Plan: See problem list for active a/p's   Follow Up Instructions: See avs for instructions unique to this ov which includes revised/ updated med list     I discussed the assessment and treatment plan with the patient. The patient was provided an opportunity to ask questions and all were answered. The patient agreed with the plan and demonstrated an understanding of the instructions.   The patient was  advised to call back or seek an in-person evaluation if the symptoms worsen or if the condition fails to improve as anticipated.  I provided 25 minutes of non-face-to-face time during this encounter.   Christinia Gully, MD

## 2020-08-30 NOTE — Telephone Encounter (Signed)
Called and spoke with patient, advised that Dr. [name] is in the hospital and will call him when he is able to do a televisit regarding his symptoms.  Advised there was no set time and to be available.  He verbalized understanding.  Nothing further needed.

## 2020-08-30 NOTE — Telephone Encounter (Signed)
Primary Pulmonologist:  Young/Wert Last office visit and with whom: 06/15/2020  Young and Dr. 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 Dr [name] has released him. Plan- he will report changes or concerns       Assessment & Plan Note by Deneise Lever, MD at 06/15/2020 4:03 PM  Author: Deneise Lever, MD Author Type: Physician Filed: 06/15/2020 4:04 PM  Note Status: Written Cosign: Cosign Not Required Encounter Date: 06/15/2020  Problem: Obstructive sleep apnea  Editor: Deneise Lever, MD (Physician)               Benefits from CPAP. Appropriate to continue. Plan- replace old machine, changing to auto 10-20       Patient Instructions by Deneise Lever, MD at 06/15/2020 10:30 AM  Author: Deneise Lever, MD Author Type: Physician Filed: 06/15/2020 11:10 AM  Note Status: Signed Cosign: Cosign Not Required Encounter Date: 06/15/2020  Editor: Deneise Lever, MD (Physician)               Order- DME [facility]   Please replace old CPAP machine, change o auto 10-20, mask of choice, humidifier, supplies Airview/ card  Please call if we can help        Instructions     Return in about 1 year (around 06/15/2021).  Order- DME [facility]   Please replace old CPAP machine, change o auto 10-20, mask of choice, humidifier, supplies Airview/ card  Please call if we can help       Reason for call: Cough (BOOP) with talking and especially at night.  On Prednisone and Zpack a few weeks ago, he was better  and then it is back.  Cough has been back for 3 days.  Denies sob, fever, chills, body aches.  Has wheezing and feeling in chest he cannot describe (burning all the time).  He is out of the Tessalon tabs, taking musinex DM day and night, taking an antihistamine as well.  He states he does not feel bad and is not coughing up any mucus.  He has had both of his covid vaccines.  Dr. [name] please advise.  (examples of things to ask: : When did symptoms start? Fever? Cough? Productive? Color to sputum? More sputum than usual? Wheezing? Have you needed increased oxygen? Are you taking your respiratory medications? What over the counter measures have you tried?)  Allergies  Allergen Reactions   Oxycodone-Acetaminophen Other (See Comments)    Stops breathing    Immunization History  Administered Date(s) Administered   Fluad Quad(high Dose 65+) 06/25/2019   Influenza Split 07/09/2011   Influenza Whole 05/14/2010, 06/09/2012   Influenza, High Dose Seasonal PF 05/22/2014, 07/04/2015, 05/16/2016, 07/09/2018   Influenza,inj,Quad PF,6+ Mos 07/11/2013   Influenza-Unspecified 07/03/2017   Moderna Sars-Covid-2 Vaccination 11/18/2019, 12/27/2019   Pneumococcal Conjugate-13 08/07/2014   Pneumococcal Polysaccharide-23 09/08/2002, 07/11/2013   Td 09/08/2004, 02/12/2016   Zoster 07/09/2011   Zoster Recombinat (Shingrix) 04/06/2018, 06/17/2018

## 2020-08-30 NOTE — Telephone Encounter (Signed)
Needs televist today added on for me today  and let him know I'll call him as soon as I get the chance, not at a fixed time

## 2020-08-30 NOTE — Patient Instructions (Signed)
Prednisone 10 mg x 2 daily until better then one daily until seen in 2 weeks with cxr on return

## 2020-08-30 NOTE — Assessment & Plan Note (Signed)
Onset of symptoms feb 2017 with "photo neg pumonary edema pattern" Baseline PFTs 03/18/10  FVC  3.59 (80%) with ERV 67% and dlco 75 corrects to 106 - Allergy profile 12/19/15 >  Eos 0.2 /  IgE  89/ Pos Cat RAST only  12/19/2015  ESR 93 > started on prednisone 20 mg daily  HSP profile 12/19/15 >  Neg  - 01/07/2016 ESR down to 18 but only 30% improved symptomatically > rec continue 20 mg daily until better then 10 mg daily  - 02/01/2016 rec taper off x 6 weeks then ov > 05/16/2016 ESR = 58> restarted pred 06/03/16  - Collagen Vasc dz screen 07/29/2016  Neg  - PFT's  11/11/2016  FEV1 1.91 (56 % ) ratio 72  p 7 % improvement from saba p nothing prior to study with DLCO  42/43 % corrects to 77 % for alv volume but ERV 35%  - ESR  20 on 10 a/w 15 as  Of 11/11/2016 > rec try 10 mg daily  - Prednisone 5 mg daily as of 12/15/16 > taper to 5 mg qod by 02/10/2017 - 02/10/2017 ESR =  28  rec taper off x 2 weeks > no flare - 04/20/2017 ESR = 32 off pred since 02/2017  - 08/12/18  ESR = 16 with onset of cough  -  Cough recurred similar to boop late November 2021  >  08/30/2020   Prednisone 10 mg x 2 daily until better then one daily until seen in 2 weeks with cxr   Pt convinced it's the same problem as before and note cough is breaking thru good gerd rx so The goal with a chronic steroid dependent illness is always arriving at the lowest effective dose that controls the disease/symptoms and not accepting a set "formula" which is based on statistics or guidelines that don't always take into account patient  variability or the natural hx of the dz in every individual patient, which may well vary over time.  For now therefore I recommend the patient maintain  20 mg / day until better then 1 daily    Each maintenance medication was reviewed in detail including most importantly the difference between maintenance and as needed and under what circumstances the prns are to be used.  Please see AVS for specific  Instructions which are unique  to this visit and I personally typed out  which were reviewed in detail over the phone with the patient and a copy provided via my chart

## 2020-09-02 DIAGNOSIS — Z03818 Encounter for observation for suspected exposure to other biological agents ruled out: Secondary | ICD-10-CM | POA: Diagnosis not present

## 2020-09-02 DIAGNOSIS — Z20822 Contact with and (suspected) exposure to covid-19: Secondary | ICD-10-CM | POA: Diagnosis not present

## 2020-09-09 ENCOUNTER — Other Ambulatory Visit: Payer: Self-pay | Admitting: Family Medicine

## 2020-09-17 ENCOUNTER — Telehealth: Payer: Self-pay | Admitting: Internal Medicine

## 2020-09-17 ENCOUNTER — Other Ambulatory Visit: Payer: Self-pay | Admitting: Internal Medicine

## 2020-09-17 ENCOUNTER — Telehealth: Payer: Self-pay | Admitting: Endocrinology

## 2020-09-17 MED ORDER — FAMOTIDINE 20 MG PO TABS: ORAL_TABLET | ORAL | 1 refills | Status: DC

## 2020-09-17 NOTE — Telephone Encounter (Signed)
Error - patient called this office in error

## 2020-09-17 NOTE — Telephone Encounter (Signed)
Spoke to pt and advised that refills were sent to pharmacy. Pt verbalized understanding. Nothing further needed at this time.

## 2020-09-19 ENCOUNTER — Telehealth: Payer: Self-pay | Admitting: Internal Medicine

## 2020-09-19 ENCOUNTER — Encounter: Payer: Self-pay | Admitting: Internal Medicine

## 2020-09-19 MED ORDER — AZITHROMYCIN 250 MG PO TABS: 250.0000 mg | ORAL_TABLET | ORAL | 0 refills | Status: DC

## 2020-09-19 NOTE — Telephone Encounter (Signed)
09/19/2020  Patient contacted our office reporting that he is having worsened dyspnea on exertion, cough and congestion.  Congestion is thick yellow-green mucus.  He remains afebrile.  Temperature today 95 9.  Patient is up-to-date with COVID-19 vaccinations including booster.  He denies any recent sick contacts.  Patient has been managed by Dr. [name] for Boop.  He is currently taking prednisone 20 mg twice daily.  Patient is unsure if he needs an antibiotic or chest imaging.  Patient does have the availability to come in to the [place] office next week if were able to utilize 1 of Dr. Gustavus Bryant held slots.  Of note patient is planning a trip to [place] in February/2022 where he will be out of the state for about a month.  He is concerned about doing this travel with the acute symptoms that he is having.  Dr. [name] please advise  Wyn Quaker, FNP

## 2020-09-19 NOTE — Telephone Encounter (Signed)
Let's do a z apk and f/u using one of my held slots next week

## 2020-09-19 NOTE — Telephone Encounter (Signed)
Spoke with the pt and notified of response per Dr [name]  He verbalized understanding  Appt made for 09/26/20 and zpack was sent

## 2020-09-21 ENCOUNTER — Other Ambulatory Visit: Payer: Self-pay | Admitting: Internal Medicine

## 2020-09-26 ENCOUNTER — Ambulatory Visit (INDEPENDENT_AMBULATORY_CARE_PROVIDER_SITE_OTHER): Payer: Medicare Other | Admitting: Internal Medicine

## 2020-09-26 ENCOUNTER — Encounter: Payer: Self-pay | Admitting: Internal Medicine

## 2020-09-26 ENCOUNTER — Ambulatory Visit (INDEPENDENT_AMBULATORY_CARE_PROVIDER_SITE_OTHER): Payer: Medicare Other

## 2020-09-26 ENCOUNTER — Other Ambulatory Visit: Payer: Self-pay

## 2020-09-26 DIAGNOSIS — R918 Other nonspecific abnormal finding of lung field: Secondary | ICD-10-CM | POA: Diagnosis not present

## 2020-09-26 DIAGNOSIS — R058 Other specified cough: Secondary | ICD-10-CM

## 2020-09-26 DIAGNOSIS — J219 Acute bronchiolitis, unspecified: Secondary | ICD-10-CM | POA: Diagnosis not present

## 2020-09-26 MED ORDER — AZITHROMYCIN 250 MG PO TABS: ORAL_TABLET | ORAL | 0 refills | Status: DC

## 2020-09-26 NOTE — Assessment & Plan Note (Addendum)
Recurrent pattern since feb 2017 Trial of gabapentin 100 tid 07/29/2016 >  Still tessalon dep 09/11/2016 so increased to 300 tid and ppi bid ac > not improved 11/11/2016 > rec max gerd rx and sugarless candy > resolved 12/19/2016  And off gabapentin 04/2017  - recurred early dec 2019 while off gabapentin and low dose ppi only  - 09/20/2018 rec max gerd rx and 1st gen H1 blockers per guidelines  And if not better start back on gabapentin  - 11/24/2019 recurred around November 07 2019 on return from car trip to [place] > rx pred/zpak/max gerd rx   Very challenging to tease this problem apart from boop so should def continue gerd rx and may consider rechallenging with gabapentin if not responding as in past to relatively short courses of systemic steroids.          Each maintenance medication was reviewed in detail including emphasizing most importantly the difference between maintenance and prns and under what circumstances the prns are to be triggered using an action plan format where appropriate.  Total time for H and P, chart review, counseling, reviewing   and generating customized AVS unique to this acute office visit / same day charting  > 30 min

## 2020-09-26 NOTE — Patient Instructions (Addendum)
Please remember to go to the x-ray department  for your tests - we will call you with the results when they are available.  Taper off prednisone completely as you if cough flares restart previous dose that was effective.   Make sure you check your oxygen saturation at your highest level of activity to be sure it stays over 90% and keep track of it at least once a week, more often if breathing getting worse, and let me know if losing ground.    Please schedule a follow up visit in 6  months but call sooner if needed

## 2020-09-26 NOTE — Assessment & Plan Note (Addendum)
Onset of symptoms feb 2017 with "photo neg pumonary edema pattern" Baseline PFTs 03/18/10  FVC  3.59 (80%) with ERV 67% and dlco 75 corrects to 106 - Allergy profile 12/19/15 >  Eos 0.2 /  IgE  89/ Pos Cat RAST only  12/19/2015  ESR 93 > started on prednisone 20 mg daily  HSP profile 12/19/15 >  Neg  - 01/07/2016 ESR down to 18 but only 30% improved symptomatically > rec continue 20 mg daily until better then 10 mg daily  - 02/01/2016 rec taper off x 6 weeks then ov > 05/16/2016 ESR = 58> restarted pred 06/03/16  - Collagen Vasc dz screen 07/29/2016  Neg  - PFT's  11/11/2016  FEV1 1.91 (56 % ) ratio 72  p 7 % improvement from saba p nothing prior to study with DLCO  42/43 % corrects to 77 % for alv volume but ERV 35%  - ESR  20 on 10 a/w 15 as  Of 11/11/2016 > rec try 10 mg daily  - Prednisone 5 mg daily as of 12/15/16 > taper to 5 mg qod by 02/10/2017 - 02/10/2017 ESR =  28  rec taper off x 2 weeks > no flare - 04/20/2017 ESR = 32 off pred since 02/2017  - 08/12/18  ESR = 16 with onset of cough  -  Cough recurred similar to boop late November 2021  >  08/30/2020   Prednisone 10 mg x 2 daily until better then one daily until seen > cxr 09/26/2020 on 10 mg daily = wnl so rec taper off over 10 days  and see if cough flares   The goal with a chronic steroid dependent illness is always arriving at the lowest effective dose that controls the disease/symptoms and not accepting a set "formula" which is based on statistics or guidelines that don't always take into account patient  variability or the natural hx of the dz in every individual patient, which may well vary over time.  For now therefore I recommend the patient maintain  A floor dose of 0 if possible and if not then check his esr prior to restarting prednisone, esp in high doses, due to risk may be > benefit   As he is heading to the [facility] for month of February I rec:  1) keep a zpak handy if needed  2) Make sure you check your oxygen saturation at your highest  level of activity to be sure it stays over 90% and keep track of it at least once a week, more often if breathing getting worse, and let me know if losing ground.

## 2020-09-26 NOTE — Progress Notes (Signed)
Subjective:    Patient ID: Kurt Aguilar, male    DOB: [birth year],   MRN: [id number]    Brief patient profile:  20 yowm never cigarette smoker(just some cigars)  after cabg in [place] started noting recurrent winter cough regardless of where located in [place] ([place]/ [place]) with typical acute episode in [place] in Feb 2017  This  cough  more dry less severe than previous >  abx and pred > improved but did not resolve and worse when stopped it so restarted pred/abx > dx as pneumonia with variable as dz > referred to pulmonary clinic 12/19/2015 by [name] with 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   History of Present Illness  12/19/2015 1st [facility] office visit/ Sun Kihn   Chief Complaint  Patient presents with  . Pulmonary Consult    Referred by [name]. Pt c/o cough x 4 wks- non prod and worse in the evening and when he lies down. Talking and exertion are things that trigger the cough. He has been on pred x 2 and both times cough resolved and immediately returns once done with med. He also c/o SOB- gets winded just walking from room to room at home.   while on prednisone still some weak and sob but better cough and last dose at least one week and worse overall since off it assoc with sob room to room and weakness/ excess/ purulent sputum or mucus plugs / no unusual exp / no ctd.  Has new CPAP machine fall 2016  rec  Omeprazole 20 mg Take 30- 60 min before your first and last meals of the day  Prednisone 10  X 2 each day until 100% better then 10 mg daily  Dx is Brochilitis obliterans with organizing pneumonia vs eosiniphic pneumonia most likely diagnosis GERD  Diet       02/01/2016  f/u ov/Jasmene Goswami re: boop tapered pred to 10 mg as 01/28/16  Chief Complaint  Patient presents with  . Follow-up    pt states he is much improved since last office visit.  SOB has resolved.    Not limited by breathing from desired activities / concerned about wt gain from pred  rec Prednisone  10 mg with bfast x 2 week then 5 mg x 2 weeks then 5 mg even days x 2 weeks and stop  If breathing/cough worse or nausea resume previous dose    09/11/2016  f/u ov/Elenor Wildes re: BOOP/uacs  on pred 10/5  prilosec 20 mg ac and hs  Chief Complaint  Patient presents with  . Follow-up    Increased SOB for the past 3-4 days. He gets SOB with exertion such as getting dressed or walking accross the room.  He has also started coughing more "feels like I burned my lungs"- prod with white sputum.     was doing ok on 10/5 in terms of breathing but worse gradually x one week p several weeks on the 10/5 dose  Cough some better but still needing freq tessalon on gabapentin 100 tid  rec Prednisone 10 mg  2 daily until better then 1 daily x 2 weeks then 10 alternating with 5 until return Prilosec should be 20 mg Take 30- 60 min before your first and last meals of the day  Increase gabapentin to 300 mg three times daily   .       02/10/2017  f/u ov/Kanylah Muench re:  PF / boop down to 10 mg one half qod and maint on gabapentin  300 tid for UACS Chief Complaint  Patient presents with  . Follow-up    Pt states "doing great"- doing well on the pred 5 mg every other day.    no change ex tol with taper / no cough/ no nausea even on days off  rec Prednisone 5mg  one half every other day x 2 weeks and then stop -      04/15/2017  f/u ov/Shaylyn Bawa re:  PF/ boop / ? Irritable larynx  Chief Complaint  Patient presents with  . Follow-up    Pt states developed a cold mid July 2018 while traveling to [place]. He is feeling better now and not coughing much. No new co's today.   walked all over [place] up hills/ in heat no problem with sob   Still occ throat clearing better on gabapentin 300 tid and gerd rx / daytime only  rec Once the protonix runs out,  Ok to try omeprazole 20 mg before bfrast and pepcid at bedtime until you see [name]  Once you run out of gabapentin ok to leave it off and call if refills needed      08/12/18  NP  cough? Uri  Augmentin 875mg  Twice daily  For 1 week , take w/ food .  Mucinex DM Twice daily  As needed  Cough/congestion .  Tessalon Three times a day  As needed  Cough .  Begin Prednisone 20mg  daily , take with food.  Chest xray  ok      08/26/2018  f/u ov/Remer Couse re: flare of cough related to uri, much better  Chief Complaint  Patient presents with  . Follow-up    Cough is much improved. He very rarely uses his albuterol inhaler.   Dyspnea:  Pool x 2.5 h Cough: gone/ still has urge to clear to clear throat with sense of throat and chest "congestion" but no mucus Sleeping: ok / adjustable x 8 in hob rec  Prednisone  10 mg take a half x 3 days and stop  Whenever coughing , omeprazole 40 mg Take 30-60 min before first meal of the day and take pepcid 20 mg after supper (if not improving then omeprazole should be:  Take 30-60 min before first meal of the day ) For drainage / throat tickle try take CHLORPHENIRAMINE  4 mg - take one every 4 hours as needed - available over the counter- may cause drowsiness so start with just a bedtime dose or two and see how you tolerate it before trying in daytime    09/20/2018  f/u ov/Andyn Sales re: uacs flare early dec 2019 - no longer on gabapentin and only using 20 mg ppi/ 10 mg pepcid  Chief Complaint  Patient presents with  . Acute Visit    Cough flared back up approx 2 wks ago, but has been getting better. He is not producing any sputum.   Dyspnea:  Water aerobics tol fine   Cough: esp in am but dry Sleeping: elevate hob only/ on cpap   Some sense of pnds not responding to zyrtec  rec Whenever coughing , omeprazole 40 mg (20 x 2) Take 30-60 min before first meal of the day and take pepcid 20 mg (10 x 2)  after supper - then when better ok to resume previous routine  For drainage / throat tickle try take CHLORPHENIRAMINE  4 mg -Chlortabs at [facility])  take one every 4 hours   11/24/2019  Acute extended  ov/Reverie Vaquera re: did fine for over a  year omeprazole  20mg  ac only then 2 weeks prior to OV on arrival from [place] Onset 6 pm cough s assoc obvious sniffles or obvious pnds but more severe cough then covid shot 1 week prior to OV  And worse with more severe cough and sob and throat burning and mucus turing a little yellow  Chief Complaint  Patient presents with  . Acute Visit    Pt c/o cough and "chest burning" for the past wk. He also c/o increased SOB. Cough is occ prod with clear to yellow sputum.    Dyspnea:  With activity whereas previously not limited including hills  Cough: esp after supper slt more productive  Sleeping: on cpap / ? Worse noct hb > just started pepcid hs   SABA use: none  02: none  rec Prednisone 10 mg take  4 each am x 2 days,   2 each am x 2 days,  1 each am x 2 days and stop  Zpak   Whenever coughing , omeprazole 40 mg (20mg   x 2) Take 30-60 min before first meal of the day and take pepcid 20 mg (10mg  x 2)  after supper - then when all  better ok to resume previous routine  For drainage / throat tickle try take CHLORPHENIRAMINE  4 mg  For cough > tessilon 200 mg up to every 6 hours as needed  GERD  Diet     08/30/20  Cough x 3 days  televisit  rec Prednisone 10 mg x 2 daily until better then one daily until seen in 2 weeks with cxr on return zpak and increased to 40 mg daily and back to 10 mg daily 09/26/2020     09/26/2020  Acute  ov/Nicol Herbig re: recurrent cough ? boop vs uacs, much better p pred/zpack Chief Complaint  Patient presents with  . Cough    Reports symptoms improved since completing z-pack rx      Dyspnea:  More doe with wt gain / stopped ex/ not checking sats  Cough: dry tickle sensation  now sporadic, daytime, minimal use of delsym  Sleeping: no cough or sob/ on cpap and electric bed  SABA use: none 02: none    No obvious day to day or daytime variability or assoc excess/ purulent sputum or mucus plugs or hemoptysis or cp or chest tightness, subjective wheeze or overt sinus or hb symptoms.    Sleeping as above without nocturnal  or early am exacerbation  of respiratory  c/o's or need for noct saba. Also denies any obvious fluctuation of symptoms with weather or environmental changes or other aggravating or alleviating factors except as outlined above   No unusual exposure hx or h/o childhood pna/ asthma or knowledge of premature birth.  Current Allergies, Complete Past Medical History, Past Surgical History, Family History, and Social History were reviewed in [employer] record.  ROS  The following are not active complaints unless bolded Hoarseness, sore throat, dysphagia, dental problems, itching, sneezing,  nasal congestion or discharge of excess mucus or purulent secretions, ear ache,   fever, chills, sweats, unintended wt loss or wt gain since starting prednisone, classically pleuritic or exertional cp,  orthopnea pnd or arm/hand swelling  or leg swelling, presyncope, palpitations, abdominal pain, anorexia, nausea, vomiting, diarrhea  or change in bowel habits or change in bladder habits, change in stools or change in urine, dysuria, hematuria,  rash, arthralgias, visual complaints, headache, numbness, weakness or ataxia or problems with walking or  coordination,  change in mood or  memory.        Current Meds  Medication Sig  . allopurinol (ZYLOPRIM) 100 MG tablet TAKE 1 TABLET BY MOUTH TWICE A DAY  . aspirin 81 MG tablet Take 1 tablet (81 mg total) by mouth 2 (two) times daily after a meal.  . benzonatate (TESSALON) 200 MG capsule TAKE 1 CAPSULE BY MOUTH EVERY 6 HOURS AS NEEDED  . Bioflavonoid Products (VITAMIN C) CHEW Chew 1 tablet by mouth daily.  . Blood Glucose Monitoring Suppl (FREESTYLE LITE) DEVI 1 each by Does not apply route 4 (four) times daily. E11.65  . Carboxymethylcellul-Glycerin (LUBRICATING EYE DROPS OP) Place 1 drop into both eyes daily as needed (dry eyes).  . Cholecalciferol (VITAMIN D3 PO) Take 1 capsule by mouth daily.  Marland Kitchen CINNAMON PO  Take 1,000 mg by mouth 2 (two) times daily.  . colchicine 0.6 MG tablet TAKE 1 TABLET BY MOUTH TWICE A DAY WITH A MEAL AS NEEDED FOR ACUTE FLARE OF GOUT (Patient taking differently: Take 0.6 mg by mouth 2 (two) times daily as needed (gout flare).)  . Dextromethorphan-guaiFENesin (MUCINEX DM MAXIMUM STRENGTH) 60-1200 MG TB12 Take 1 tablet by mouth 2 (two) times daily as needed (congestion).   . ezetimibe (ZETIA) 10 MG tablet Take 1 tablet (10 mg total) by mouth daily.  . famotidine (PEPCID) 20 MG tablet TAKE 1 TABLET BY MOUTH EVERYDAY AT BEDTIME  . Glucosamine HCl (GLUCOSAMINE PO) Take 1,500 mg by mouth 2 (two) times daily.   Marland Kitchen glucose blood (FREESTYLE LITE) test strip 1 each by Other route 4 (four) times daily. E11.65  . HYDROcodone-acetaminophen (NORCO) 5-325 MG tablet Take 1-2 tablets by mouth every 6 (six) hours as needed for moderate pain or severe pain (post op pain). With food  . INVOKANA 100 MG TABS tablet TAKE 1 TABLET BY MOUTH EVERY OTHER DAY  . Lancets (FREESTYLE) lancets 1 EACH BY OTHER ROUTE 4 (FOUR) TIMES DAILY. E11.65  . Lutein 20 MG CAPS Take 20 mg by mouth daily.   . methocarbamol (ROBAXIN) 500 MG tablet Take 1 tablet (500 mg total) by mouth every 8 (eight) hours as needed for muscle spasms. Caution of sedation  . metoprolol tartrate (LOPRESSOR) 25 MG tablet Take 0.5 tablets (12.5 mg total) by mouth daily.  . Multiple Vitamins-Minerals (ZINC PO) Take 1 tablet by mouth daily.  Marland Kitchen omeprazole (PRILOSEC) 20 MG capsule TAKE 2 CAPSULES (40 MG TOTAL) BY MOUTH DAILY BEFORE BREAKFAST.  Marland Kitchen ondansetron (ZOFRAN) 8 MG tablet Take 1 tablet (8 mg total) by mouth every 8 (eight) hours as needed for nausea or vomiting. From narcotic pain medicine  . predniSONE (DELTASONE) 10 MG tablet 2 DAILY UNTIL BETTER THEN 1 DAILY  . rosuvastatin (CRESTOR) 40 MG tablet Take 1 tablet (40 mg total) by mouth daily.  .                             Objective:   Physical Exam      09/26/2020        220   11/24/2019        206  09/20/2018        222  08/26/2018      222 07/29/2017      232  04/15/2017          243  02/10/2017          250  12/19/2016  250 11/11/2016          258  09/11/2016          252  07/29/2016      244  05/16/2016          238  02/01/2016        235  01/07/2016          228   12/19/15 229 lb 4 oz (103.987 kg)  12/19/15 229 lb 4 oz (103.987 kg)  12/19/15 227 lb (102.967 kg)       Vital signs reviewed  09/26/2020  - Note at rest 02 sats  99% on RA   General appearance:    Robust slt hoarse amb wm nad     HEENT : pt wearing mask not removed for exam due to covid -19 concerns.    NECK :  without JVD/Nodes/TM/ nl carotid upstrokes bilaterally   LUNGS: no acc muscle use,  Nl contour chest which is clear to A and P bilaterally without cough on insp or exp maneuvers   CV:  RRR  no s3 or murmur or increase in P2, and no edema   ABD:  soft and nontender with nl inspiratory excursion in the supine position. No bruits or organomegaly appreciated, bowel sounds nl  MS:  Nl gait/ ext warm without deformities, calf tenderness, cyanosis or clubbing No obvious joint restrictions   SKIN: warm and dry without lesions    NEURO:  alert, approp, nl sensorium with  no motor or cerebellar deficits apparent.      CXR PA and Lateral:   09/26/2020 :    I personally reviewed images and  impression as follows:   No acute changes or evidence of active dz              Assessment & Plan:

## 2020-11-28 DIAGNOSIS — H353131 Nonexudative age-related macular degeneration, bilateral, early dry stage: Secondary | ICD-10-CM | POA: Diagnosis not present

## 2020-11-28 DIAGNOSIS — H5213 Myopia, bilateral: Secondary | ICD-10-CM | POA: Diagnosis not present

## 2020-11-28 DIAGNOSIS — H02889 Meibomian gland dysfunction of unspecified eye, unspecified eyelid: Secondary | ICD-10-CM | POA: Diagnosis not present

## 2020-11-28 DIAGNOSIS — E119 Type 2 diabetes mellitus without complications: Secondary | ICD-10-CM | POA: Diagnosis not present

## 2020-11-28 LAB — HM DIABETES EYE EXAM

## 2020-11-30 ENCOUNTER — Encounter: Payer: Self-pay | Admitting: Family Medicine

## 2020-12-04 ENCOUNTER — Other Ambulatory Visit: Payer: Self-pay

## 2020-12-04 ENCOUNTER — Ambulatory Visit (INDEPENDENT_AMBULATORY_CARE_PROVIDER_SITE_OTHER): Payer: Medicare Other | Admitting: Internal Medicine

## 2020-12-04 ENCOUNTER — Encounter: Payer: Self-pay | Admitting: Internal Medicine

## 2020-12-04 ENCOUNTER — Telehealth: Payer: Self-pay | Admitting: Internal Medicine

## 2020-12-04 DIAGNOSIS — R918 Other nonspecific abnormal finding of lung field: Secondary | ICD-10-CM | POA: Diagnosis not present

## 2020-12-04 DIAGNOSIS — R058 Other specified cough: Secondary | ICD-10-CM

## 2020-12-04 MED ORDER — AZITHROMYCIN 250 MG PO TABS: ORAL_TABLET | ORAL | 0 refills | Status: DC

## 2020-12-04 MED ORDER — OMEPRAZOLE 40 MG PO CPDR: DELAYED_RELEASE_CAPSULE | ORAL | 2 refills | Status: DC

## 2020-12-04 MED ORDER — PREDNISONE 10 MG PO TABS: ORAL_TABLET | ORAL | 0 refills | Status: DC

## 2020-12-04 MED ORDER — TRAMADOL HCL 50 MG PO TABS: 50.0000 mg | ORAL_TABLET | ORAL | 0 refills | Status: AC | PRN

## 2020-12-04 NOTE — Progress Notes (Signed)
Subjective:    Patient ID: Kurt Aguilar, male    DOB: 1946-05-18,   MRN: [id number]    Brief patient profile:  67 yowm never cigarette smoker(just some cigars)  after cabg in [place] started noting recurrent winter cough regardless of where located in [place] ([place]/ [place]) with typical acute episode in [place] in Feb 2017  This  cough  more dry less severe than previous >  abx and pred > improved but did not resolve and worse when stopped it so restarted pred/abx > dx as pneumonia with variable as dz > referred to pulmonary clinic 12/19/2015 by [name] with 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   History of Present Illness  12/19/2015 1st [facility] office visit/ Kurt Aguilar   Chief Complaint  Patient presents with  . Pulmonary Consult    Referred by [name]. Pt c/o cough x 4 wks- non prod and worse in the evening and when he lies down. Talking and exertion are things that trigger the cough. He has been on pred x 2 and both times cough resolved and immediately returns once done with med. He also c/o SOB- gets winded just walking from room to room at home.   while on prednisone still some weak and sob but better cough and last dose at least one week and worse overall since off it assoc with sob room to room and weakness/ excess/ purulent sputum or mucus plugs / no unusual exp / no ctd.  Has new CPAP machine fall 2016  rec  Omeprazole 20 mg Take 30- 60 min before your first and last meals of the day  Prednisone 10  X 2 each day until 100% better then 10 mg daily  Dx is Brochilitis obliterans with organizing pneumonia vs eosiniphic pneumonia most likely diagnosis GERD  Diet       02/01/2016  f/u ov/Kurt Aguilar re: boop tapered pred to 10 mg as 01/28/16  Chief Complaint  Patient presents with  . Follow-up    pt states he is much improved since last office visit.  SOB has resolved.    Not limited by breathing from desired activities / concerned about wt gain from pred  rec Prednisone  10 mg with bfast x 2 week then 5 mg x 2 weeks then 5 mg even days x 2 weeks and stop  If breathing/cough worse or nausea resume previous dose    09/11/2016  f/u ov/Kurt Aguilar re: BOOP/uacs  on pred 10/5  prilosec 20 mg ac and hs  Chief Complaint  Patient presents with  . Follow-up    Increased SOB for the past 3-4 days. He gets SOB with exertion such as getting dressed or walking accross the room.  He has also started coughing more "feels like I burned my lungs"- prod with white sputum.     was doing ok on 10/5 in terms of breathing but worse gradually x one week p several weeks on the 10/5 dose  Cough some better but still needing freq tessalon on gabapentin 100 tid  rec Prednisone 10 mg  2 daily until better then 1 daily x 2 weeks then 10 alternating with 5 until return Prilosec should be 20 mg Take 30- 60 min before your first and last meals of the day  Increase gabapentin to 300 mg three times daily   .       02/10/2017  f/u ov/Kurt Aguilar re:  PF / boop down to 10 mg one half qod and maint on gabapentin  300 tid for UACS Chief Complaint  Patient presents with  . Follow-up    Pt states "doing great"- doing well on the pred 5 mg every other day.    no change ex tol with taper / no cough/ no nausea even on days off  rec Prednisone 5mg  one half every other day x 2 weeks and then stop -      04/15/2017  f/u ov/Kurt Aguilar re:  PF/ boop / ? Irritable larynx  Chief Complaint  Patient presents with  . Follow-up    Pt states developed a cold mid July 2018 while traveling to [place]. He is feeling better now and not coughing much. No new co's today.   walked all over [place] up hills/ in heat no problem with sob   Still occ throat clearing better on gabapentin 300 tid and gerd rx / daytime only  rec Once the protonix runs out,  Ok to try omeprazole 20 mg before bfrast and pepcid at bedtime until you see Dr Kurt Aguilar  Once you run out of gabapentin ok to leave it off and call if refills needed      08/12/18  NP  cough? Uri  Augmentin 875mg  Twice daily  For 1 week , take w/ food .  Mucinex DM Twice daily  As needed  Cough/congestion .  Tessalon Three times a day  As needed  Cough .  Begin Prednisone 20mg  daily , take with food.  Chest xray  ok      08/26/2018  f/u ov/Kurt Aguilar re: flare of cough related to uri, much better  Chief Complaint  Patient presents with  . Follow-up    Cough is much improved. He very rarely uses his albuterol inhaler.   Dyspnea:  Pool x 2.5 h Cough: gone/ still has urge to clear to clear throat with sense of throat and chest "congestion" but no mucus Sleeping: ok / adjustable x 8 in hob rec  Prednisone  10 mg take a half x 3 days and stop  Whenever coughing , omeprazole 40 mg Take 30-60 min before first meal of the day and take pepcid 20 mg after supper (if not improving then omeprazole should be:  Take 30-60 min before first meal of the day ) For drainage / throat tickle try take CHLORPHENIRAMINE  4 mg - take one every 4 hours as needed - available over the counter- may cause drowsiness so start with just a bedtime dose or two and see how you tolerate it before trying in daytime    09/20/2018  f/u ov/Kurt Aguilar re: uacs flare early dec 2019 - no longer on gabapentin and only using 20 mg ppi/ 10 mg pepcid  Chief Complaint  Patient presents with  . Acute Visit    Cough flared back up approx 2 wks ago, but has been getting better. He is not producing any sputum.   Dyspnea:  Water aerobics tol fine   Cough: esp in am but dry Sleeping: elevate hob only/ on cpap   Some sense of pnds not responding to zyrtec  rec Whenever coughing , omeprazole 40 mg (20 x 2) Take 30-60 min before first meal of the day and take pepcid 20 mg (10 x 2)  after supper - then when better ok to resume previous routine  For drainage / throat tickle try take CHLORPHENIRAMINE  4 mg -Chlortabs at [facility])  take one every 4 hours   11/24/2019  Acute extended  ov/Kurt Aguilar re: did fine for over a  year omeprazole  20mg  ac only then 2 weeks prior to OV on arrival from [place] Onset 6 pm cough s assoc obvious sniffles or obvious pnds but more severe cough then covid shot 1 week prior to OV  And worse with more severe cough and sob and throat burning and mucus turing a little yellow  Chief Complaint  Patient presents with  . Acute Visit    Pt c/o cough and "chest burning" for the past wk. He also c/o increased SOB. Cough is occ prod with clear to yellow sputum.    Dyspnea:  With activity whereas previously not limited including hills  Cough: esp after supper slt more productive  Sleeping: on cpap / ? Worse noct hb > just started pepcid hs   SABA use: none  02: none  rec Prednisone 10 mg take  4 each am x 2 days,   2 each am x 2 days,  1 each am x 2 days and stop  Zpak   Whenever coughing , omeprazole 40 mg (20mg   x 2) Take 30-60 min before first meal of the day and take pepcid 20 mg (10mg  x 2)  after supper - then when all  better ok to resume previous routine  For drainage / throat tickle try take CHLORPHENIRAMINE  4 mg  For cough > tessilon 200 mg up to every 6 hours as needed  GERD  Diet     08/30/20  Cough x 3 days  televisit  rec Prednisone 10 mg x 2 daily until better then one daily until seen in 2 weeks with cxr on return zpak and increased to 40 mg daily and back to 10 mg daily 09/26/2020     09/26/2020  Acute  ov/Kurt Aguilar re: recurrent cough ? boop vs uacs, much better p pred/zpack Chief Complaint  Patient presents with  . Cough    Reports symptoms improved since completing z-pack rx      Dyspnea:  More doe with wt gain / stopped ex/ not checking sats  Cough: dry tickle sensation  now sporadic, daytime, minimal use of delsym  Sleeping: no cough or sob/ on cpap and electric bed  SABA use: none 02: none  rec  Taper off prednisone completely as you usually do and  if cough flares restart previous dose that was effective.  Make sure you check your oxygen saturation at your highest level of  activity to be sure it stays over 90%     Virtual Visit via Telephone Note 12/04/2020   I connected with Kurt Aguilar on 12/04/20 at 12:00 PM EDT by telephone and verified that I am speaking with the correct person using two identifiers. Pt is at home and this call made from my office with no other participants    I discussed the limitations, risks, security and privacy concerns of performing an evaluation and management service by telephone and the availability of in person appointments. I also discussed with the patient that there may be a patient responsible charge related to this service. The patient expressed understanding and agreed to proceed.   History of Present Illness: Tapered down to 2.5 mg still some cough p supper then 23 th wife got sick with neg covid 3/21  then pt started 22nd with sore throat, low grade fever /dry cough sats running mid 90s s vomiting / some loose stool  Dyspnea:  Breathing ok unless coughing  Cough: brown 3/28 and non productive 2/29   02: none  Prednisone  up to 20 mg since 3/23    No obvious day to day or daytime variability or assoc   mucus plugs or hemoptysis or cp or chest tightness,   or overt sinus or hb symptoms.    Also denies any obvious fluctuation of symptoms with weather or environmental changes or other aggravating or alleviating factors except as outlined above.   Meds reviewed/ med reconciliation completed        Observations/Objective: Hoarse wm having severe dry sounding coughing fits    Assessment and Plan: See problem list for active a/p's   Follow Up Instructions: See avs for instructions unique to this ov which includes revised/ updated med list     I discussed the assessment and treatment plan with the patient. The patient was provided an opportunity to ask questions and all were answered. The patient agreed with the plan and demonstrated an understanding of the instructions.   The patient was advised to call back  or seek an in-person evaluation if the symptoms worsen or if the condition fails to improve as anticipated.  I provided 23  minutes of non-face-to-face time during this encounter.   Christinia Gully, MD

## 2020-12-04 NOTE — Telephone Encounter (Signed)
Spoke with the pt's spouse  She states pt has been coughing for the past 5-6 days non stop  Tried increasing the pred to 20 mg daily and this has done nothing  She also reports he started with sore throat and has had low grade fever off and on  Televisit per MW- scheduled

## 2020-12-04 NOTE — Assessment & Plan Note (Signed)
Onset of symptoms feb 2017 with "photo neg pumonary edema pattern" Baseline PFTs 03/18/10  FVC  3.59 (80%) with ERV 67% and dlco 75 corrects to 106 - Allergy profile 12/19/15 >  Eos 0.2 /  IgE  89/ Pos Cat RAST only  12/19/2015  ESR 93 > started on prednisone 20 mg daily  HSP profile 12/19/15 >  Neg  - 01/07/2016 ESR down to 18 but only 30% improved symptomatically > rec continue 20 mg daily until better then 10 mg daily  - 02/01/2016 rec taper off x 6 weeks then ov > 05/16/2016 ESR = 58> restarted pred 06/03/16  - Collagen Vasc dz screen 07/29/2016  Neg  - PFT's  11/11/2016  FEV1 1.91 (56 % ) ratio 72  p 7 % improvement from saba p nothing prior to study with DLCO  42/43 % corrects to 77 % for alv volume but ERV 35%  - ESR  20 on 10 a/w 15 as  Of 11/11/2016 > rec try 10 mg daily  - Prednisone 5 mg daily as of 12/15/16 > taper to 5 mg qod by 02/10/2017 - 02/10/2017 ESR =  28  rec taper off x 2 weeks > no flare - 04/20/2017 ESR = 32 off pred since 02/2017  - 08/12/18  ESR = 16 with onset of cough  -  Cough recurred similar to boop late November 2021  >  08/30/2020   Prednisone 10 mg x 2 daily until better then one daily until seen > cxr 09/26/2020 on 10 mg daily = wnl so rec taper off over 10 days  and see if cough flares   prob viral uri triggering severe cough but hard to be sure so rec:  Stay on prednisone until better then taper as usual to 2.5 mg daily   For cough > mucinex dm 1200 twice daily supplment with Tramadol 50 mg 1-2 every 4 hours  As needed   Take zpak   Add  prilosec 40 mg before supper until cough gone then resume the am dose only   Refilled the prednisone   Check home covid test and let me know if positive  Call on 12/06/20 if not improving - to ER if worse or 02 sats go less than 90% at rest   Each maintenance medication was reviewed in detail including most importantly the difference between maintenance and as needed and under what circumstances the prns are to be used.  Please see AVS for  specific  Instructions which are unique to this visit and I personally typed out  which were reviewed in detail over the phone with the patient and a copy provided by MyChart

## 2020-12-04 NOTE — Assessment & Plan Note (Signed)
Recurrent pattern since feb 2017 Trial of gabapentin 100 tid 07/29/2016 >  Still tessalon dep 09/11/2016 so increased to 300 tid and ppi bid ac > not improved 11/11/2016 > rec max gerd rx and sugarless candy > resolved 12/19/2016  And off gabapentin 04/2017  - recurred early dec 2019 while off gabapentin and low dose ppi only  - 09/20/2018 rec max gerd rx and 1st gen H1 blockers per guidelines  And if not better start back on gabapentin  - 11/24/2019 recurred around November 07 2019 on return from car trip to [place] > rx pred/zpak/max gerd rx  > much better prior to 11/27/20 when caught wife's "cold"   Of the three most common causes of  Sub-acute / recurrent or chronic cough, only one (GERD)  can actually contribute to/ trigger  the other two (asthma and post nasal drip syndrome)  and perpetuate the cylce of cough.  While not intuitively obvious, many patients with chronic low grade reflux do not cough until there is a primary insult that disturbs the protective epithelial barrier and exposes sensitive nerve endings.   This is typically viral but can due to PNDS and  either may apply here.   The point is that once this occurs, it is difficult to eliminate the cycle  using anything but a maximally effective acid suppression regimen at least in the short run, accompanied by an appropriate diet to address non acid GERD and control / eliminate the cough itself for at least 3 days with tramadol

## 2020-12-04 NOTE — Patient Instructions (Addendum)
Stay on prednisone until better then taper as usual to 2.5 mg daily   For cough > mucinex dm 1200 twice daily supplment with Tramadol 50 mg 1-2 every 4 hours  As needed   Take zpak   Add  prilosec 40 mg before supper until cough gone then resume the am dose only   Refilled the prednisone   Check home covid test and let me know if positive  Call on 12/06/20 if not improving - to ER if worse or 02 sats go less than 90% at rest

## 2020-12-04 NOTE — Telephone Encounter (Signed)
Pt wife, Nancy(okay per DPR) pt has an URI. Pt is coughing so hard he is short of breath to the point of almost passing out. Pt has been taking prednisone(20mg  a day x6 days) but it isn't helping.Pt has hx of BOOP. Please advise [phone]

## 2020-12-11 ENCOUNTER — Other Ambulatory Visit: Payer: Self-pay

## 2020-12-11 ENCOUNTER — Ambulatory Visit: Payer: Medicare Other | Admitting: Primary Care

## 2020-12-11 ENCOUNTER — Telehealth: Payer: Self-pay | Admitting: Internal Medicine

## 2020-12-11 ENCOUNTER — Ambulatory Visit: Payer: Medicare Other

## 2020-12-11 ENCOUNTER — Ambulatory Visit (INDEPENDENT_AMBULATORY_CARE_PROVIDER_SITE_OTHER): Payer: Medicare Other

## 2020-12-11 ENCOUNTER — Encounter: Payer: Self-pay | Admitting: Primary Care

## 2020-12-11 VITALS — BP 118/74 | HR 70 | Temp 97.6°F | Ht 70.0 in | Wt 224.0 lb

## 2020-12-11 DIAGNOSIS — R918 Other nonspecific abnormal finding of lung field: Secondary | ICD-10-CM

## 2020-12-11 DIAGNOSIS — R058 Other specified cough: Secondary | ICD-10-CM

## 2020-12-11 DIAGNOSIS — R059 Cough, unspecified: Secondary | ICD-10-CM | POA: Diagnosis not present

## 2020-12-11 MED ORDER — ADVAIR HFA 115-21 MCG/ACT IN AERO: 2.0000 | INHALATION_SPRAY | Freq: Two times a day (BID) | RESPIRATORY_TRACT | 1 refills | Status: DC

## 2020-12-11 NOTE — Progress Notes (Signed)
_0  ID: Kurt Aguilar, male    DOB: [birth year], 75 y.o.   MRN: [id number]  Chief Complaint  Patient presents with  . Follow-up    Symptoms started on 6 weeks ago. Difficulty breathing, non productive cough. Still on prednisone 18m.     Referring provider: TAbner Greenspan MD    Brief patient profile:  89yowm never cigarette smoker(just some cigars)  [place]  This cough more dry less severe than previous >  abx and pred > improved but did not resolve and worse when stopped it so restarted pred/abx > dx as pneumonia with variable as dz > referred to pulmonary clinic 12/19/2015 by Dr AMarjory Lieswith 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  HPI: 75year old male, former smoker quit in 2006/ PMH significant for OSA, pulmonary infiltrates with high ESR c/w BOOP. Patient of Dr. WMelvyn Novas he had a televisit on 12/04/20. Felt viral URI triggering severe cough. Recommended to stay on prednisone until better than taper to 2.545mdaily. Given prescription for Zpack.   12/11/2020- Inteirm hx  Patient presents today for acute visit. He was seen on 12/04/20 by Dr. WeMelvyn Novasnd given Zpack and prednisone taper. He was beginning to feel better for a couple of days but symptoms returned. He continues to have a some shortness of breath, nonproductive cough, chest congestion, wheezing. Symptoms have been present for the last 6 weeks. He remains on 2064mrednisone. He is taking 102m34mrtec. He is compliant with GERD medication. He uses tramadol at bedtime to help him sleep. O2 stable on room air. Home covid test negative x 2. He is vaccinated and boosted.  PFTs in March 2018 showed mild obstructive airway disease, severe restriction and severe diffusion defect.  .   Onset of symptoms feb 2017 with "photo neg pumonary edema pattern" Baseline PFTs 03/18/10  FVC  3.59 (80%)  with ERV 67% and dlco 75 corrects to 106 - Allergy profile 12/19/15 >  Eos 0.2 /  IgE  89/ Pos Cat RAST only  12/19/2015  ESR 93 > started on prednisone 20 mg daily  HSP profile 12/19/15 >  Neg  - 01/07/2016 ESR down to 18 but only 30% improved symptomatically > rec continue 20 mg daily until better then 10 mg daily  - 02/01/2016 rec taper off x 6 weeks then ov > 05/16/2016 ESR = 58> restarted pred 06/03/16  - Collagen Vasc dz screen 07/29/2016  Neg  - PFT's  11/11/2016  FEV1 1.91 (56 % ) ratio 72  p 7 % improvement from saba p nothing prior to study with DLCO  42/43 % corrects to 77 % for alv volume but ERV 35%  - ESR  20 on 10 a/w 15 as  Of 11/11/2016 > rec try 10 mg daily  - Prednisone 5 mg daily as of 12/15/16 > taper to 5 mg qod by 02/10/2017 - 02/10/2017 ESR =  28  rec taper off x 2 weeks > no flare - 04/20/2017 ESR = 32 off pred since 02/2017  - 08/12/18  ESR = 16 with onset of cough  -  Cough recurred similar to boop late November 2021  >  08/30/2020   Prednisone 10 mg x 2 daily until better then one daily until seen > cxr 09/26/2020 on 10 mg daily = wnl so rec taper off over 10 days  and see if cough flares   Allergies  Allergen Reactions  . Oxycodone-Acetaminophen Other (See Comments)    Stops breathing    Immunization History  Administered Date(s) Administered  . Fluad Quad(high Dose 65+) 06/25/2019  . Influenza Split 07/09/2011  . Influenza Whole 05/14/2010, 06/09/2012  . Influenza, High Dose Seasonal PF 05/22/2014, 07/04/2015, 05/16/2016, 07/09/2018  . Influenza,inj,Quad PF,6+ Mos 07/11/2013  . Influenza-Unspecified 07/03/2017  . Moderna Sars-Covid-2 Vaccination 11/18/2019, 12/27/2019, 07/24/2020  . Pneumococcal Conjugate-13 08/07/2014  . Pneumococcal Polysaccharide-23 09/08/2002, 07/11/2013  . Td 09/08/2004, 02/12/2016  . Zoster 07/09/2011  . Zoster Recombinat (Shingrix) 04/06/2018, 06/17/2018    Past Medical History:  Diagnosis Date  . Arthritis   . Chronic kidney disease   .  Complication of anesthesia    constipation and inability to  urinate happened a few days after cataract surgery   . Diabetes ([place])    type 2   . Diverticulitis   . GERD (gastroesophageal reflux disease)   . Gout   . Heart attack ([place])   . Heart disease   . Heart murmur   . Hyperglycemia   . Hyperlipidemia   . Hypertension   . Osteoporosis   . Pneumonia    hx of BOOP followed by Dr [name]   . Sleep apnea    cpapp - setting at 15     Tobacco History: Social History   Tobacco Use  Smoking Status Former Smoker  . Packs/day: 1.00  . Years: 3.00  . Pack years: 3.00  . Types: Cigars  . Quit date: 11/25/2004  . Years since quitting: 16.0  Smokeless Tobacco Never Used  Tobacco Comment   occas. cigar quit 2006   Counseling given: Not Answered Comment: occas. cigar quit 2006   Outpatient Medications Prior to Visit  Medication Sig Dispense Refill  . allopurinol (ZYLOPRIM) 100 MG tablet TAKE 1 TABLET BY MOUTH TWICE A DAY 180 tablet 1  . aspirin 81 MG tablet Take 1 tablet (81 mg total) by mouth 2 (two) times daily after a meal. 30 tablet 0  . benzonatate (TESSALON) 200 MG capsule TAKE 1 CAPSULE BY MOUTH EVERY 6 HOURS AS NEEDED 30 capsule 0  . Bioflavonoid Products (VITAMIN C) CHEW Chew 1 tablet by mouth daily.    . Blood Glucose Monitoring Suppl (FREESTYLE LITE) DEVI 1 each by Does not apply route 4 (four) times daily. E11.65 1 each 0  . Carboxymethylcellul-Glycerin (LUBRICATING EYE DROPS OP) Place 1 drop into both eyes daily as needed (dry eyes).    . Cholecalciferol (VITAMIN D3 PO) Take 1 capsule by mouth daily.    Marland Kitchen CINNAMON PO Take 1,000 mg by mouth 2 (two) times daily.    . colchicine 0.6 MG tablet TAKE 1 TABLET BY MOUTH TWICE A DAY WITH A MEAL AS NEEDED FOR ACUTE FLARE OF GOUT (Patient taking differently: Take 0.6 mg by mouth 2 (two) times daily as needed (gout flare).) 45 tablet 3  . Dextromethorphan-guaiFENesin (MUCINEX DM MAXIMUM STRENGTH) 60-1200 MG TB12 Take 1 tablet by  mouth 2 (two) times daily as needed (congestion).     . ezetimibe (ZETIA) 10 MG tablet Take 1 tablet (10 mg total) by mouth daily. 90 tablet 3  . famotidine (PEPCID) 20 MG tablet TAKE 1 TABLET BY MOUTH EVERYDAY AT BEDTIME 90 tablet 1  . Glucosamine HCl (GLUCOSAMINE PO) Take 1,500 mg by mouth 2 (two) times daily.     Marland Kitchen glucose blood (FREESTYLE LITE) test strip 1 each by Other route 4 (  four) times daily. E11.65 400 strip 0  . INVOKANA 100 MG TABS tablet TAKE 1 TABLET BY MOUTH EVERY OTHER DAY 30 tablet 5  . Lancets (FREESTYLE) lancets 1 EACH BY OTHER ROUTE 4 (FOUR) TIMES DAILY. E11.65 400 each 0  . Lutein 20 MG CAPS Take 20 mg by mouth daily.     . methocarbamol (ROBAXIN) 500 MG tablet Take 1 tablet (500 mg total) by mouth every 8 (eight) hours as needed for muscle spasms. Caution of sedation 40 tablet 1  . metoprolol tartrate (LOPRESSOR) 25 MG tablet Take 0.5 tablets (12.5 mg total) by mouth daily. 45 tablet 3  . Multiple Vitamins-Minerals (ZINC PO) Take 1 tablet by mouth daily.    Marland Kitchen omeprazole (PRILOSEC) 40 MG capsule Take 30- 60 min before your first and last meals of the day 60 capsule 2  . ondansetron (ZOFRAN) 8 MG tablet Take 1 tablet (8 mg total) by mouth every 8 (eight) hours as needed for nausea or vomiting. From narcotic pain medicine 15 tablet 0  . predniSONE (DELTASONE) 10 MG tablet 2 daily until better 100 tablet 0  . rosuvastatin (CRESTOR) 40 MG tablet Take 1 tablet (40 mg total) by mouth daily. 90 tablet 3  . azithromycin (ZITHROMAX) 250 MG tablet Take 2 on day one then 1 daily x 4 days (Patient not taking: Reported on 12/11/2020) 6 tablet 0  . azithromycin (ZITHROMAX) 250 MG tablet Take 2 on day one then 1 daily x 4 days (Patient not taking: Reported on 12/11/2020) 6 tablet 0   Facility-Administered Medications Prior to Visit  Medication Dose Route Frequency Provider Last Rate Last Admin  . betamethasone acetate-betamethasone sodium phosphate (CELESTONE) injection 3 mg  3 mg  Intramuscular Once Edrick Kins, DPM        Review of Systems  Review of Systems  Respiratory: Positive for cough, shortness of breath and wheezing.    Physical Exam  BP 118/74 (BP Location: Left Arm, Cuff Size: Normal)   Pulse 70   Temp 97.6 F (36.4 C)   Ht _0  (1.778 m)   Wt 224 lb (101.6 kg)   SpO2 97%   BMI 32.14 kg/m  Physical Exam Constitutional:      Appearance: Normal appearance.  HENT:     Head: Normocephalic and atraumatic.     Mouth/Throat:     Mouth: Mucous membranes are moist.     Pharynx: Oropharynx is clear.  Cardiovascular:     Rate and Rhythm: Normal rate and regular rhythm.     Comments: No edema Pulmonary:     Effort: Pulmonary effort is normal.     Breath sounds: No wheezing, rhonchi or rales.     Comments: Reactive cough Neurological:     General: No focal deficit present.     Mental Status: He is alert and oriented to person, place, and time. Mental status is at baseline.  Psychiatric:        Mood and Affect: Mood normal.        Behavior: Behavior normal.        Thought Content: Thought content normal.        Judgment: Judgment normal.      Lab Results:  CBC    Component Value Date/Time   WBC 9.7 05/17/2020 1444   RBC 4.77 05/17/2020 1444   HGB 15.4 05/17/2020 1444   HCT 46.3 05/17/2020 1444   PLT 262 05/17/2020 1444   MCV 97.1 05/17/2020 1444   MCH 32.3 05/17/2020  1444   MCHC 33.3 05/17/2020 1444   RDW 13.2 05/17/2020 1444   LYMPHSABS 2.7 04/09/2020 0821   MONOABS 0.6 04/09/2020 0821   EOSABS 0.3 04/09/2020 0821   BASOSABS 0.1 04/09/2020 0821    BMET    Component Value Date/Time   NA 141 05/17/2020 1444   K 4.6 05/17/2020 1444   CL 104 05/17/2020 1444   CO2 25 05/17/2020 1444   GLUCOSE 193 (H) 05/17/2020 1444   BUN 40 (H) 05/17/2020 1444   CREATININE 1.89 (H) 05/17/2020 1444   CALCIUM 9.3 05/17/2020 1444   GFRNONAA 34 (L) 05/17/2020 1444   GFRAA 39 (L) 05/17/2020 1444    BNP No results found for:  BNP  ProBNP    Component Value Date/Time   PROBNP 24.0 11/11/2016 1048    Imaging: DG Chest 2 View  Result Date: 12/11/2020 CLINICAL DATA:  Prolonged cough. EXAM: CHEST - 2 VIEW COMPARISON:  September 26, 2020. FINDINGS: The heart size and mediastinal contours are within normal limits. Sternotomy wires are noted. No pneumothorax pleural effusion is noted. Stable left basilar scarring is noted. No acute pulmonary disease is noted. The visualized skeletal structures are unremarkable. IMPRESSION: No active cardiopulmonary disease. Electronically Signed   By: Marijo Conception M.D.   On: 12/11/2020 12:03     Assessment & Plan:   Upper airway cough syndrome - Dry cough x 6 weeks. CXR today showed no acute pulmonary process. Cough likely triggered by seasonal allergies or GERD. He had mild obstruction with severe restriction on PFTs in 2018. Recommend increased prednisone as he typically responds to steroids and adding medium potency ICS/LABA inhaler. Max GERD therapy and allergy medication.   Recommendations: - Increase prednisone 11m x 3 days; 340mx 3 days; 2068m 3 days; 10m57m3 days then stop - Start trial Advair 115-21mc82mo puffs twice daily  - Continue mucinex 1200mg 5me daily  - Take tramadol 50mg 152mabs every 4 hours for cough, after eliminated for 3 days taper off  - Continue Zyrtec 10mg at54mtime; Chlorpheniramine 4mg ever30m hours as needed for cough/allergies  - Contineu Prilosec and Pepcid (Famotidine) 20mg at b31mme      Eleonore Shippee Martyn Ehrich022

## 2020-12-11 NOTE — Telephone Encounter (Signed)
I ordered CXR prior to visit and let wife know

## 2020-12-11 NOTE — Telephone Encounter (Signed)
Called and spoke to pt's wife, Izora Gala. Informed her of the recs per Dr. [name]. Dr. [name] doesn't have any openings this week. OV scheduled with Derl Barrow, NP, today at noon. Izora Gala verbalized understanding and denied any further questions or concerns at this time.   Will forward to Derl Barrow, NP, as Juluis Rainier.

## 2020-12-11 NOTE — Telephone Encounter (Signed)
Pts wife states that her husband is having difficulty breathing and his non-productive cough is getting worse. Denies a fever, and states that he does get dizzy after coughing too hard and states that he did have a negative Covid-19 test as of yesterday. Believes he needs a chest x-ray and wants advice. Also, pt states that he is still taking all of the medication Dr. [name] prescribed. Pls regard; [phone].

## 2020-12-11 NOTE — Patient Instructions (Addendum)
CXR today showed no acute pulmonary disease  Recommendations - Trial Advair inhaler (steriod +brochodilator)- take two puffs morning and evening (rinse mouth after use) - Increase prednisone to 40mg  x 3 days; then 30mg  x 3 days; then 20mg  x 3 days; than 10mg  x 3 days; then stop  - Mucinex 1,200mg  twice daily  - Tramadol 50mg  1 tab every 4 hours for cough; once you have eliminated cough for three days back off  - Continue Prilosec daily and Pepcid 20mg  at bedtime - Continue Zyrtec 10mg  at bedtime; try Chlorpheniramine 4mg  every 4 hours as needed for cough/allergies (this can cause drowsiness, use cautions- do not combine with other sedating medication)  If you develop productive cough with purulent mucus or fever please notify us and we may consider additional abx coverage and/or sputum sample   Follow-up: - First available with Dr. [name]

## 2020-12-11 NOTE — Telephone Encounter (Signed)
Can't treat this over the phone any more > needs ov with [place] this week or PCP or go to [facility]

## 2020-12-11 NOTE — Telephone Encounter (Signed)
Called and spoke to pt's wife, Kurt Aguilar. She states the pt's vitals are: temp 96.6, BP 146/92, spo2 on RA 97%, HR 60. Pt c/o orthopnea, nonprod cough, chest congestion, unsteady gait, alert and oriented, and slight wheezing. Pt denies swelling. Pt tested for covid at home on 4/4 and was negative. Pt is vaccinated and boosted against covid. Pt states he had his visit on 3/29 with Dr. [name] and felt improvement for the next few days but then over the weekend began to feel worse.   Dr. [name], please advise.   ---------------------------------------------- Last OV instructions from 3/29:  Instructions  Stay on prednisone until better then taper as usual to 2.5 mg daily   For cough > mucinex dm 1200 twice daily supplment with Tramadol 50 mg 1-2 every 4 hours  As needed   Take zpak   Add  prilosec 40 mg before supper until cough gone then resume the am dose only   Refilled the prednisone   Check home covid test and let me know if positive  Call on 12/06/20 if not improving - to ER if worse or 02 sats go less than 90% at rest

## 2020-12-11 NOTE — Assessment & Plan Note (Addendum)
-   Dry cough x 6 weeks. CXR today showed no acute pulmonary process. Cough likely triggered by seasonal allergies or GERD. He had mild obstruction with severe restriction on PFTs in 2018. Recommend increased prednisone as he typically responds to steroids and adding medium potency ICS/LABA inhaler. Max GERD therapy and allergy medication.   Recommendations: - Increase prednisone 40mg  x 3 days; 30mg  x 3 days; 20mg  x 3 days; 10mg  x 3 days then stop - Start trial Advair 115-76mcg two puffs twice daily  - Continue mucinex 1200mg  twice daily  - Take tramadol 50mg  1-2 tabs every 4 hours for cough, after eliminated for 3 days taper off  - Continue Zyrtec 10mg  at bedtime; Chlorpheniramine 4mg  every 4 hours as needed for cough/allergies  - Contineu Prilosec and Pepcid (Famotidine) 20mg  at bedtime

## 2020-12-14 DIAGNOSIS — G4733 Obstructive sleep apnea (adult) (pediatric): Secondary | ICD-10-CM | POA: Diagnosis not present

## 2021-01-15 ENCOUNTER — Other Ambulatory Visit: Payer: Self-pay

## 2021-01-15 ENCOUNTER — Ambulatory Visit: Payer: Medicare Other | Admitting: Internal Medicine

## 2021-01-15 ENCOUNTER — Encounter: Payer: Self-pay | Admitting: Internal Medicine

## 2021-01-15 DIAGNOSIS — R918 Other nonspecific abnormal finding of lung field: Secondary | ICD-10-CM | POA: Diagnosis not present

## 2021-01-15 MED ORDER — PREDNISONE 10 MG PO TABS: ORAL_TABLET | ORAL | 0 refills | Status: DC

## 2021-01-15 NOTE — Assessment & Plan Note (Signed)
Onset of symptoms feb 2017 with "photo neg pumonary edema pattern" Baseline PFTs 03/18/10  FVC  3.59 (80%) with ERV 67% and dlco 75 corrects to 106 - Allergy profile 12/19/15 >  Eos 0.2 /  IgE  89/ Pos Cat RAST only  12/19/2015  ESR 93 > started on prednisone 20 mg daily  HSP profile 12/19/15 >  Neg  - 01/07/2016 ESR down to 18 but only 30% improved symptomatically > rec continue 20 mg daily until better then 10 mg daily  - 02/01/2016 rec taper off x 6 weeks then ov > 05/16/2016 ESR = 58> restarted pred 06/03/16  - Collagen Vasc dz screen 07/29/2016  Neg  - PFT's  11/11/2016  FEV1 1.91 (56 % ) ratio 72  p 7 % improvement from saba p nothing prior to study with DLCO  42/43 % corrects to 77 % for alv volume but ERV 35%  - ESR  20 on 10 a/w 15 as  Of 11/11/2016 > rec try 10 mg daily  - Prednisone 5 mg daily as of 12/15/16 > taper to 5 mg qod by 02/10/2017 - 02/10/2017 ESR =  28  rec taper off x 2 weeks > no flare - 04/20/2017 ESR = 32 off pred since 02/2017  - 08/12/18  ESR = 16 with onset of cough  -  Cough recurred similar to Boop late November 2021  >  08/30/2020   Prednisone 10 mg x 2 daily until better then one daily until seen > cxr 09/26/2020 on 10 mg daily = wnl so rec taper off over 10 days  and see if cough flares  - 01/15/2021 flared again with taper to 5 mg so new floor of 10/5 and ceiling of 20 mg - 01/15/2021 rec bone density per PCP   The goal with a chronic steroid dependent illness is always arriving at the lowest effective dose that controls the disease/symptoms and not accepting a set "formula" which is based on statistics or guidelines that don't always take into account patient  variability or the natural hx of the dz in every individual patient, which may well vary over time.  For now therefore I recommend the patient maintain  Ceiling of 20 mg and floor of 10/5 alternating since keeps flaring on 5 mg daily          Each maintenance medication was reviewed in detail including emphasizing most  importantly the difference between maintenance and prns and under what circumstances the prns are to be triggered using an action plan format where appropriate.  Total time for H and P, chart review, counseling, reviewing  and generating customized AVS unique to this office visit / same day charting = 59mn

## 2021-01-15 NOTE — Progress Notes (Signed)
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   History of Present Illness  12/19/2015 [facility] office visit/ Daelyn Pettaway   Chief Complaint  Patient presents with  . Pulmonary Consult    Referred by [name]. Pt c/o cough x 4 wks- non prod and worse in the evening and when he lies down. Talking and exertion are things that trigger the cough. He has been on pred x 2 and both times cough resolved and immediately returns once done with med. He also c/o SOB- gets winded just walking from room to room at home.   while on prednisone still some weak and sob but better cough and last dose at least one week and worse overall since off it assoc with sob room to room and weakness/ excess/ purulent sputum or mucus plugs / no unusual exp / no ctd.  Has new CPAP machine fall 2016  rec  Omeprazole 20 mg Take 30- 60 min before your first and last meals of the day  Prednisone 10  X 2 each day until 100% better then 10 mg daily  Dx is Brochilitis obliterans with organizing pneumonia vs eosiniphic pneumonia most likely diagnosis GERD  Diet       02/01/2016  f/u ov/Carson Bogden re: boop tapered pred to 10 mg as 01/28/16  Chief Complaint  Patient presents with  . Follow-up    pt states he is much improved since last office visit.  SOB has resolved.    Not limited by breathing from desired activities / concerned about wt gain from pred  rec Prednisone  10 mg with bfast x 2 week then 5 mg x 2 weeks then 5 mg even days x 2 weeks and stop  If breathing/cough worse or nausea resume previous dose    09/11/2016  f/u ov/Zeffie Bickert re: BOOP/uacs  on pred 10/5  prilosec 20 mg ac and hs  Chief Complaint  Patient presents with  . Follow-up    Increased SOB for the past 3-4 days. He gets SOB with exertion such as getting dressed or walking accross the room.  He has also started coughing more "feels like I burned my lungs"- prod with white sputum.     was doing ok on 10/5 in terms of breathing but worse gradually x one week p several weeks on the 10/5 dose  Cough some better but still needing freq tessalon on gabapentin 100 tid  rec Prednisone 10 mg  2 daily until better then 1 daily x 2 weeks then 10 alternating with 5 until return Prilosec should be 20 mg Take 30- 60 min before your first and last meals of the day  Increase gabapentin to 300 mg three times daily   .       02/10/2017  f/u ov/Gennesis Hogland re:  PF / boop down to 10 mg one half qod and maint on gabapentin  300 tid for UACS Chief Complaint  Patient presents with  . Follow-up    Pt states "doing great"- doing well on the pred 5 mg every other day.    no change ex tol with taper / no cough/ no nausea even on days off  rec Prednisone 5mg  one half every other day x 2 weeks and then stop -      04/15/2017  f/u ov/Stephaney Steven re:  PF/ boop / ? Irritable larynx  Chief Complaint  Patient presents with  . Follow-up    Pt states developed a cold mid July 2018 while traveling to [place]. He is feeling better now and not coughing much. No new co's today.   walked all over [place] up hills/ in heat no problem with sob   Still occ throat clearing better on gabapentin 300 tid and gerd rx / daytime only  rec Once the protonix runs out,  Ok to try omeprazole 20 mg before bfrast and pepcid at bedtime until you see [name]  Once you run out of gabapentin ok to leave it off and call if refills needed      08/12/18  NP  cough? Uri  Augmentin 875mg  Twice daily  For 1 week , take w/ food .  Mucinex DM Twice daily  As needed  Cough/congestion .  Tessalon Three times a day  As needed  Cough .  Begin Prednisone 20mg  daily , take with food.  Chest xray  ok      08/26/2018  f/u ov/Lyndee Herbst re: flare of cough related to uri, much better  Chief Complaint  Patient presents with  . Follow-up    Cough is much improved. He very rarely uses his albuterol inhaler.   Dyspnea:  Pool x 2.5 h Cough: gone/ still has urge to clear to clear throat with sense of throat and chest "congestion" but no mucus Sleeping: ok / adjustable x 8 in hob rec  Prednisone  10 mg take a half x 3 days and stop  Whenever coughing , omeprazole 40 mg Take 30-60 min before first meal of the day and take pepcid 20 mg after supper (if not improving then omeprazole should be:  Take 30-60 min before first meal of the day ) For drainage / throat tickle try take CHLORPHENIRAMINE  4 mg - take one every 4 hours as needed - available over the counter- may cause drowsiness so start with just a bedtime dose or two and see how you tolerate it before trying in daytime    09/20/2018  f/u ov/Shaheen Mende re: uacs flare early dec 2019 - no longer on gabapentin and only using 20 mg ppi/ 10 mg pepcid  Chief Complaint  Patient presents with  . Acute Visit    Cough flared back up approx 2 wks ago, but has been getting better. He is not producing any sputum.   Dyspnea:  Water aerobics tol fine   Cough: esp in am but dry Sleeping: elevate hob only/ on cpap   Some sense of pnds not responding to zyrtec  rec Whenever coughing , omeprazole 40 mg (20 x 2) Take 30-60 min before first meal of the day and take pepcid 20 mg (10 x 2)  after supper - then when better ok to resume previous routine  For drainage / throat tickle try take CHLORPHENIRAMINE  4 mg -[facility]lortabs at [facility])  take one every 4 hours   11/24/2019  Acute extended  ov/Khayman Kirsch re: did fine for over a  year omeprazole  20mg  ac only then 2 weeks prior to OV on arrival from [place] Onset 6 pm cough s assoc obvious sniffles or obvious pnds but more severe cough then covid shot 1 week prior to OV  And worse with more severe cough and sob and throat burning and mucus turing a little yellow  Chief Complaint  Patient presents with  . Acute Visit    Pt c/o cough and "chest burning" for the past wk. He also c/o increased SOB. Cough is occ prod with clear to yellow sputum.    Dyspnea:  With activity whereas previously not limited including hills  Cough: esp after supper slt more productive  Sleeping: on cpap / ? Worse noct hb > just started pepcid hs   SABA use: none  02: none  rec Prednisone 10 mg take  4 each am x 2 days,   2 each am x 2 days,  1 each am x 2 days and stop  Zpak   Whenever coughing , omeprazole 40 mg (20mg   x 2) Take 30-60 min before first meal of the day and take pepcid 20 mg (10mg  x 2)  after supper - then when all  better ok to resume previous routine  For drainage / throat tickle try take CHLORPHENIRAMINE  4 mg  For cough > tessilon 200 mg up to every 6 hours as needed  GERD  Diet     08/30/20  Cough x 3 days  televisit  rec Prednisone 10 mg x 2 daily until better then one daily until seen in 2 weeks with cxr on return zpak and increased to 40 mg daily and back to 10 mg daily 09/26/2020     09/26/2020  Acute  ov/Haydin Calandra re: recurrent cough ? boop vs uacs, much better p pred/zpack Chief Complaint  Patient presents with  . Cough    Reports symptoms improved since completing z-pack rx      Dyspnea:  More doe with wt gain / stopped ex/ not checking sats  Cough: dry tickle sensation  now sporadic, daytime, minimal use of delsym  Sleeping: no cough or sob/ on cpap and electric bed  SABA use: none 02: none  rec Stay on prednisone until better then taper as usual to 2.5 mg daily  For cough > mucinex dm 1200 twice daily supplment with Tramadol 50 mg 1-2 every 4 hours  As needed  Take zpak   Add  prilosec 40 mg before supper until cough gone then resume the am dose only  Refilled the prednisone  Check home covid test and let me know if positive Call on 12/06/20 if not improving - to ER if worse or 02 sats go less than 90% at rest     01/15/2021  f/u ov/Lanijah Warzecha re: BOOP back to 5 mg daily worse x several weeks  Chief Complaint  Patient presents with  . Follow-up    SOB with exertion. On 5 mg or prednisone, feels like Sob is getting worse since decreased dose. Dry cough worse in the afternoon and at night.    Dyspnea:    MMRC3 = can't walk 100 yards even at a slow pace at a flat grade s stopping due to sob   Cough: dry / good at hs p 10 min of cough Sleeping: finally does sleep ok flat (no bed blocks_  SABA use: none  02: none Covid status:   vax x 3    No obvious day to day or  daytime variability or assoc excess/ purulent sputum or mucus plugs or hemoptysis or cp or chest tightness, subjective wheeze or overt sinus or hb symptoms.     Also denies any obvious fluctuation of symptoms with weather or environmental changes or other aggravating or alleviating factors except as outlined above   No unusual exposure hx or h/o childhood pna/ asthma or knowledge of premature birth.  Current Allergies, Complete Past Medical History, Past Surgical History, Family History, and Social History were reviewed in [employer] record.  ROS  The following are not active complaints unless bolded Hoarseness, sore throat, dysphagia, dental problems, itching, sneezing,  nasal congestion or discharge of excess mucus or purulent secretions, ear ache,   fever, chills, sweats, unintended wt loss or wt gain, classically pleuritic or exertional cp,  orthopnea pnd or arm/hand swelling  or leg swelling, presyncope, palpitations, abdominal pain, anorexia, nausea, vomiting, diarrhea  or change in bowel habits or change in bladder habits, change in stools or change in urine, dysuria,  hematuria,  rash, arthralgias, visual complaints, headache, numbness, weakness or ataxia or problems with walking or coordination,  change in mood or  memory.        Current Meds  Medication Sig  . allopurinol (ZYLOPRIM) 100 MG tablet TAKE 1 TABLET BY MOUTH TWICE A DAY  . aspirin 81 MG tablet Take 1 tablet (81 mg total) by mouth 2 (two) times daily after a meal.  . benzonatate (TESSALON) 200 MG capsule TAKE 1 CAPSULE BY MOUTH EVERY 6 HOURS AS NEEDED  . Bioflavonoid Products (VITAMIN C) CHEW Chew 1 tablet by mouth daily.  . Blood Glucose Monitoring Suppl (FREESTYLE LITE) DEVI 1 each by Does not apply route 4 (four) times daily. E11.65  . Carboxymethylcellul-Glycerin (LUBRICATING EYE DROPS OP) Place 1 drop into both eyes daily as needed (dry eyes).  . Cholecalciferol (VITAMIN D3 PO) Take 1 capsule by mouth daily.  Marland Kitchen CINNAMON PO Take 1,000 mg by mouth 2 (two) times daily.  . colchicine 0.6 MG tablet TAKE 1 TABLET BY MOUTH TWICE A DAY WITH A MEAL AS NEEDED FOR ACUTE FLARE OF GOUT (Patient taking differently: Take 0.6 mg by mouth 2 (two) times daily as needed (gout flare).)  . Dextromethorphan-guaiFENesin (MUCINEX DM MAXIMUM STRENGTH) 60-1200 MG TB12 Take 1 tablet by mouth 2 (two) times daily as needed (congestion).   . ezetimibe (ZETIA) 10 MG tablet Take 1 tablet (10 mg total) by mouth daily.  . famotidine (PEPCID) 20 MG tablet TAKE 1 TABLET BY MOUTH EVERYDAY AT BEDTIME  . fluticasone-salmeterol (ADVAIR HFA) 115-21 MCG/ACT inhaler Inhale 2 puffs into the lungs 2 (two) times daily.  . Glucosamine HCl (GLUCOSAMINE PO) Take 1,500 mg by mouth 2 (two) times daily.   Marland Kitchen glucose blood (FREESTYLE LITE) test strip 1 each by Other route 4 (four) times daily. E11.65  . INVOKANA 100 MG TABS tablet TAKE 1 TABLET BY MOUTH EVERY OTHER DAY  . Lancets (FREESTYLE) lancets 1 EACH BY OTHER ROUTE 4 (FOUR) TIMES DAILY. E11.65  . Lutein 20 MG CAPS Take 20 mg by mouth daily.   . methocarbamol (ROBAXIN) 500 MG tablet Take  1 tablet (500 mg total) by mouth every 8 (eight) hours as needed for muscle spasms. Caution of sedation  . metoprolol tartrate (LOPRESSOR) 25 MG tablet Take 0.5 tablets (12.5 mg total) by mouth daily.  . Multiple Vitamins-Minerals (ZINC PO) Take 1 tablet by mouth daily.  Marland Kitchen omeprazole (PRILOSEC) 40 MG capsule Take 30- 60 min before  your first and last meals of the day  . ondansetron (ZOFRAN) 8 MG tablet Take 1 tablet (8 mg total) by mouth every 8 (eight) hours as needed for nausea or vomiting. From narcotic pain medicine  . predniSONE (DELTASONE) 10 MG tablet 2 daily until better  . rosuvastatin (CRESTOR) 40 MG tablet Take 1 tablet (40 mg total) by mouth daily.   Current Facility-Administered Medications for the 01/15/21 encounter (Office Visit) with Tanda Rockers, MD  Medication  . betamethasone acetate-betamethasone sodium phosphate (CELESTONE) injection 3 mg                   Objective:   Physical Exam     01/15/2021       231  09/26/2020        220  11/24/2019        206  09/20/2018        222  08/26/2018      222 07/29/2017      232  04/15/2017          243  02/10/2017          250  12/19/2016        250 11/11/2016          258  09/11/2016          252  07/29/2016      244  05/16/2016          238  02/01/2016        235  01/07/2016          228   12/19/15 229 lb 4 oz (103.987 kg)  12/19/15 229 lb 4 oz (103.987 kg)  12/19/15 227 lb (102.967 kg)    Vital signs reviewed  01/15/2021  - Note at rest 02 sats  96% on RA   General appearance:    Hoarse amb wm nad  slt cushingnoid   HEENT : pt wearing mask not removed for exam due to covid -19 concerns.    NECK :  without JVD/Nodes/TM/ nl carotid upstrokes bilaterally   LUNGS: no acc muscle use,  Nl contour chest with  insp crackles /cough  bilaterally with  cough on insp maneuvers   CV:  RRR  no s3 or murmur or increase in P2, and no edema   ABD:  Obese soft and nontender with nl inspiratory excursion in the supine position. No  bruits or organomegaly appreciated, bowel sounds nl  MS:  Nl gait/ ext warm without deformities, calf tenderness, cyanosis or clubbing No obvious joint restrictions   SKIN: warm and dry without lesions    NEURO:  alert, approp, nl sensorium with  no motor or cerebellar deficits apparent.           Assessment & Plan:

## 2021-01-15 NOTE — Patient Instructions (Addendum)
4th vaccine is at least 6 months after the 3rd  Dulera 100  (or advair)  Take 2 puffs first thing in am and then another 2 puffs about 12 hours later.   Work on inhaler technique:  relax and gently blow all the way out then take a nice smooth deep breath back in, triggering the inhaler at same time you start breathing in.  Hold for up to 5 seconds if you can. Blow out thru nose. Rinse and gargle with water when done.  Prednisone 10 mg 2 each am until better then  1 daily x one daily x one week and still better try 10 mg alternating with 5 mg daily to see if can wean to the lowest effective dose  Bed blocks x 6-8 in  Bone density at next check up visit with PCP   Please schedule a follow up visit in 3 months but call sooner if needed

## 2021-01-29 DIAGNOSIS — Z20822 Contact with and (suspected) exposure to covid-19: Secondary | ICD-10-CM | POA: Diagnosis not present

## 2021-02-05 ENCOUNTER — Other Ambulatory Visit: Payer: Self-pay | Admitting: Primary Care

## 2021-02-13 DIAGNOSIS — G4733 Obstructive sleep apnea (adult) (pediatric): Secondary | ICD-10-CM | POA: Diagnosis not present

## 2021-02-25 ENCOUNTER — Ambulatory Visit: Payer: Medicare Other | Admitting: Endocrinology

## 2021-02-25 ENCOUNTER — Other Ambulatory Visit: Payer: Self-pay | Admitting: Internal Medicine

## 2021-03-05 ENCOUNTER — Other Ambulatory Visit: Payer: Self-pay | Admitting: Internal Medicine

## 2021-03-06 ENCOUNTER — Other Ambulatory Visit: Payer: Self-pay | Admitting: Family Medicine

## 2021-03-08 ENCOUNTER — Other Ambulatory Visit: Payer: Self-pay

## 2021-03-08 DIAGNOSIS — E1165 Type 2 diabetes mellitus with hyperglycemia: Secondary | ICD-10-CM

## 2021-03-08 MED ORDER — CANAGLIFLOZIN 100 MG PO TABS: 100.0000 mg | ORAL_TABLET | ORAL | 0 refills | Status: DC

## 2021-03-19 ENCOUNTER — Other Ambulatory Visit: Payer: Self-pay | Admitting: Internal Medicine

## 2021-03-26 ENCOUNTER — Telehealth: Payer: Self-pay | Admitting: Internal Medicine

## 2021-03-26 ENCOUNTER — Ambulatory Visit: Payer: Medicare Other | Admitting: Internal Medicine

## 2021-03-26 DIAGNOSIS — G4733 Obstructive sleep apnea (adult) (pediatric): Secondary | ICD-10-CM

## 2021-03-26 NOTE — Telephone Encounter (Signed)
Called and spoke with Patient.  Patient stated [facility] Romans said he needed a new cpap supply prescription, because he changed insurance to [employer]. Patient is scheduled cpap follow up with Dr. Annamaria Boots 06/17/21.  Message routed to Dr.Young to advise  Allergies  Allergen Reactions   Oxycodone-Acetaminophen Other (See Comments)    Stops breathing   Current Outpatient Medications on File Prior to Visit  Medication Sig Dispense Refill   ADVAIR HFA 115-21 MCG/ACT inhaler INHALE 2 PUFFS INTO THE LUNGS TWICE A DAY 12 each 1   allopurinol (ZYLOPRIM) 100 MG tablet TAKE 1 TABLET BY MOUTH TWICE A DAY 180 tablet 0   aspirin 81 MG tablet Take 1 tablet (81 mg total) by mouth 2 (two) times daily after a meal. 30 tablet 0   benzonatate (TESSALON) 200 MG capsule TAKE 1 CAPSULE BY MOUTH EVERY 6 HOURS AS NEEDED 30 capsule 0   Bioflavonoid Products (VITAMIN C) CHEW Chew 1 tablet by mouth daily.     Blood Glucose Monitoring Suppl (FREESTYLE LITE) DEVI 1 each by Does not apply route 4 (four) times daily. E11.65 1 each 0   canagliflozin (INVOKANA) 100 MG TABS tablet Take 1 tablet (100 mg total) by mouth every other day. 30 tablet 0   Carboxymethylcellul-Glycerin (LUBRICATING EYE DROPS OP) Place 1 drop into both eyes daily as needed (dry eyes).     Cholecalciferol (VITAMIN D3 PO) Take 1 capsule by mouth daily.     CINNAMON PO Take 1,000 mg by mouth 2 (two) times daily.     colchicine 0.6 MG tablet TAKE 1 TABLET BY MOUTH TWICE A DAY WITH A MEAL AS NEEDED FOR ACUTE FLARE OF GOUT (Patient taking differently: Take 0.6 mg by mouth 2 (two) times daily as needed (gout flare).) 45 tablet 3   Dextromethorphan-guaiFENesin (MUCINEX DM MAXIMUM STRENGTH) 60-1200 MG TB12 Take 1 tablet by mouth 2 (two) times daily as needed (congestion).      ezetimibe (ZETIA) 10 MG tablet Take 1 tablet (10 mg total) by mouth daily. 90 tablet 3   famotidine (PEPCID) 20 MG tablet TAKE 1 TABLET BY MOUTH EVERYDAY AT BEDTIME 90 tablet 1   Glucosamine HCl  (GLUCOSAMINE PO) Take 1,500 mg by mouth 2 (two) times daily.      glucose blood (FREESTYLE LITE) test strip 1 each by Other route 4 (four) times daily. E11.65 400 strip 0   Lancets (FREESTYLE) lancets 1 EACH BY OTHER ROUTE 4 (FOUR) TIMES DAILY. E11.65 400 each 0   Lutein 20 MG CAPS Take 20 mg by mouth daily.      methocarbamol (ROBAXIN) 500 MG tablet Take 1 tablet (500 mg total) by mouth every 8 (eight) hours as needed for muscle spasms. Caution of sedation 40 tablet 1   metoprolol tartrate (LOPRESSOR) 25 MG tablet Take 0.5 tablets (12.5 mg total) by mouth daily. 45 tablet 3   Multiple Vitamins-Minerals (ZINC PO) Take 1 tablet by mouth daily.     omeprazole (PRILOSEC) 40 MG capsule TAKE 1 CAPUSLE BY MOUTH 30- 60 MIN BEFORE YOUR FIRST AND LAST MEALS OF THE DAY 180 capsule 3   ondansetron (ZOFRAN) 8 MG tablet Take 1 tablet (8 mg total) by mouth every 8 (eight) hours as needed for nausea or vomiting. From narcotic pain medicine 15 tablet 0   predniSONE (DELTASONE) 10 MG tablet 2 daily until better 100 tablet 0   predniSONE (DELTASONE) 10 MG tablet TAKE 1 TABLET TWICE DAILY UNTIL BETTER THEN ONE DAILY 100 tablet 0   rosuvastatin (  CRESTOR) 40 MG tablet Take 1 tablet (40 mg total) by mouth daily. 90 tablet 3   Current Facility-Administered Medications on File Prior to Visit  Medication Dose Route Frequency Provider Last Rate Last Admin   betamethasone acetate-betamethasone sodium phosphate (CELESTONE) injection 3 mg  3 mg Intramuscular Once Edrick Kins, DPM

## 2021-03-26 NOTE — Telephone Encounter (Signed)
DME order placed for cpap supplies.  Patient aware. Nothing further at this time.

## 2021-03-26 NOTE — Telephone Encounter (Signed)
Order- DME [facility] Romans- renewal script for CPAP 17, mask of choice, humidifier, supplies, AirView/ card

## 2021-03-28 ENCOUNTER — Other Ambulatory Visit: Payer: Self-pay | Admitting: Family Medicine

## 2021-03-28 ENCOUNTER — Other Ambulatory Visit: Payer: Self-pay | Admitting: Internal Medicine

## 2021-03-28 NOTE — Telephone Encounter (Signed)
Verified patient is only taking 5 mg prednisone daily.

## 2021-03-28 NOTE — Telephone Encounter (Signed)
Take one daily

## 2021-03-28 NOTE — Telephone Encounter (Signed)
01/15/21  Is patient continuing with 5 mg?

## 2021-04-01 ENCOUNTER — Ambulatory Visit: Payer: Medicare Other | Admitting: Internal Medicine

## 2021-04-08 ENCOUNTER — Telehealth: Payer: Self-pay | Admitting: Family Medicine

## 2021-04-08 DIAGNOSIS — N138 Other obstructive and reflux uropathy: Secondary | ICD-10-CM

## 2021-04-08 DIAGNOSIS — E1165 Type 2 diabetes mellitus with hyperglycemia: Secondary | ICD-10-CM

## 2021-04-08 DIAGNOSIS — I10 Essential (primary) hypertension: Secondary | ICD-10-CM

## 2021-04-08 DIAGNOSIS — E1169 Type 2 diabetes mellitus with other specified complication: Secondary | ICD-10-CM

## 2021-04-08 DIAGNOSIS — N401 Enlarged prostate with lower urinary tract symptoms: Secondary | ICD-10-CM

## 2021-04-08 DIAGNOSIS — M1A00X0 Idiopathic chronic gout, unspecified site, without tophus (tophi): Secondary | ICD-10-CM

## 2021-04-08 NOTE — Telephone Encounter (Signed)
-----   Message from Cloyd Stagers, RT sent at 03/25/2021 10:06 AM EDT ----- Regarding: Lab Orders for Tuesday 8.2.2022 Please place lab orders for Tuesday 8.2.2022, office visit for physical on Friday 8.5.2022 Thank you, Dyke Maes RT(R)

## 2021-04-09 ENCOUNTER — Other Ambulatory Visit: Payer: Self-pay

## 2021-04-09 ENCOUNTER — Other Ambulatory Visit (INDEPENDENT_AMBULATORY_CARE_PROVIDER_SITE_OTHER): Payer: Medicare Other

## 2021-04-09 DIAGNOSIS — N401 Enlarged prostate with lower urinary tract symptoms: Secondary | ICD-10-CM

## 2021-04-09 DIAGNOSIS — N138 Other obstructive and reflux uropathy: Secondary | ICD-10-CM | POA: Diagnosis not present

## 2021-04-09 DIAGNOSIS — E1169 Type 2 diabetes mellitus with other specified complication: Secondary | ICD-10-CM | POA: Diagnosis not present

## 2021-04-09 DIAGNOSIS — E785 Hyperlipidemia, unspecified: Secondary | ICD-10-CM | POA: Diagnosis not present

## 2021-04-09 DIAGNOSIS — I10 Essential (primary) hypertension: Secondary | ICD-10-CM

## 2021-04-09 DIAGNOSIS — M1A00X0 Idiopathic chronic gout, unspecified site, without tophus (tophi): Secondary | ICD-10-CM

## 2021-04-09 LAB — PSA, MEDICARE: PSA: 1.18 ng/ml (ref 0.10–4.00)

## 2021-04-09 LAB — CBC WITH DIFFERENTIAL/PLATELET
Basophils Absolute: 0.1 10*3/uL (ref 0.0–0.1)
Basophils Relative: 0.6 % (ref 0.0–3.0)
Eosinophils Absolute: 0.2 10*3/uL (ref 0.0–0.7)
Eosinophils Relative: 2.4 % (ref 0.0–5.0)
HCT: 39.1 % (ref 39.0–52.0)
Hemoglobin: 13 g/dL (ref 13.0–17.0)
Lymphocytes Relative: 34.8 % (ref 12.0–46.0)
Lymphs Abs: 3.1 10*3/uL (ref 0.7–4.0)
MCHC: 33.3 g/dL (ref 30.0–36.0)
MCV: 96.7 fl (ref 78.0–100.0)
Monocytes Absolute: 0.7 10*3/uL (ref 0.1–1.0)
Monocytes Relative: 7.6 % (ref 3.0–12.0)
Neutro Abs: 4.8 10*3/uL (ref 1.4–7.7)
Neutrophils Relative %: 54.6 % (ref 43.0–77.0)
Platelets: 232 10*3/uL (ref 150.0–400.0)
RBC: 4.04 Mil/uL — ABNORMAL LOW (ref 4.22–5.81)
RDW: 14.6 % (ref 11.5–15.5)
WBC: 8.9 10*3/uL (ref 4.0–10.5)

## 2021-04-09 LAB — COMPREHENSIVE METABOLIC PANEL
ALT: 16 U/L (ref 0–53)
AST: 16 U/L (ref 0–37)
Albumin: 4.3 g/dL (ref 3.5–5.2)
Alkaline Phosphatase: 45 U/L (ref 39–117)
BUN: 29 mg/dL — ABNORMAL HIGH (ref 6–23)
CO2: 28 mEq/L (ref 19–32)
Calcium: 9.2 mg/dL (ref 8.4–10.5)
Chloride: 105 mEq/L (ref 96–112)
Creatinine, Ser: 1.81 mg/dL — ABNORMAL HIGH (ref 0.40–1.50)
GFR: 35.61 mL/min — ABNORMAL LOW (ref >60.00)
Glucose, Bld: 113 mg/dL — ABNORMAL HIGH (ref 70–99)
Potassium: 4.5 mEq/L (ref 3.5–5.1)
Sodium: 143 mEq/L (ref 135–145)
Total Bilirubin: 0.7 mg/dL (ref 0.2–1.2)
Total Protein: 6.6 g/dL (ref 6.0–8.3)

## 2021-04-09 LAB — LIPID PANEL
Cholesterol: 154 mg/dL (ref 0–200)
HDL: 44.3 mg/dL (ref >39.00)
NonHDL: 109.95
Total CHOL/HDL Ratio: 3
Triglycerides: 242 mg/dL — ABNORMAL HIGH (ref 0.0–149.0)
VLDL: 48.4 mg/dL — ABNORMAL HIGH (ref 0.0–40.0)

## 2021-04-09 LAB — URIC ACID: Uric Acid, Serum: 4.9 mg/dL (ref 4.0–7.8)

## 2021-04-09 LAB — LDL CHOLESTEROL, DIRECT: Direct LDL: 74 mg/dL

## 2021-04-09 LAB — TSH: TSH: 3.86 u[IU]/mL (ref 0.35–5.50)

## 2021-04-10 ENCOUNTER — Ambulatory Visit (INDEPENDENT_AMBULATORY_CARE_PROVIDER_SITE_OTHER): Payer: Medicare Other

## 2021-04-10 DIAGNOSIS — Z0000 Encounter for general adult medical examination without abnormal findings: Secondary | ICD-10-CM

## 2021-04-10 NOTE — Progress Notes (Signed)
PCP notes:  Health Maintenance: No gaps noted   Abnormal Screenings: none   Patient concerns: Refill Zetia    Nurse concerns: none   Next PCP appt.: 04/12/2021 @ 3 pm

## 2021-04-10 NOTE — Patient Instructions (Signed)
Kurt Aguilar , Thank you for taking time to come for your Medicare Wellness Visit. I appreciate your ongoing commitment to your health goals. Please review the following plan we discussed and let me know if I can assist you in the future.   Screening recommendations/referrals: Colonoscopy: no longer required  Recommended yearly ophthalmology/optometry visit for glaucoma screening and checkup Recommended yearly dental visit for hygiene and checkup  Vaccinations: Influenza vaccine: due Fall 2022 Pneumococcal vaccine: Completed series Tdap vaccine: Up to date, completed 02/12/2016, due 02/2026 Shingles vaccine: Completed series   Covid-19: completed 3 vaccines   Advanced directives: copy in chart  Conditions/risks identified: diabetes, hyperlipidemia   Next appointment: Follow up in one year for your annual wellness visit.   Preventive Care 77 Years and Older, Male Preventive care refers to lifestyle choices and visits with your health care provider that can promote health and wellness. What does preventive care include? A yearly physical exam. This is also called an annual well check. Dental exams once or twice a year. Routine eye exams. Ask your health care provider how often you should have your eyes checked. Personal lifestyle choices, including: Daily care of your teeth and gums. Regular physical activity. Eating a healthy diet. Avoiding tobacco and drug use. Limiting alcohol use. Practicing safe sex. Taking low doses of aspirin every day. Taking vitamin and mineral supplements as recommended by your health care provider. What happens during an annual well check? The services and screenings done by your health care provider during your annual well check will depend on your age, overall health, lifestyle risk factors, and family history of disease. Counseling  Your health care provider may ask you questions about your: Alcohol use. Tobacco use. Drug use. Emotional  well-being. Home and relationship well-being. Sexual activity. Eating habits. History of falls. Memory and ability to understand (cognition). Work and work Statistician. Screening  You may have the following tests or measurements: Height, weight, and BMI. Blood pressure. Lipid and cholesterol levels. These may be checked every 5 years, or more frequently if you are over 18 years old. Skin check. Lung cancer screening. You may have this screening every year starting at age 77 if you have a 30-pack-year history of smoking and currently smoke or have quit within the past 15 years. Fecal occult blood test (FOBT) of the stool. You may have this test every year starting at age 77. Flexible sigmoidoscopy or colonoscopy. You may have a sigmoidoscopy every 5 years or a colonoscopy every 10 years starting at age 77. Prostate cancer screening. Recommendations will vary depending on your family history and other risks. Hepatitis C blood test. Hepatitis B blood test. Sexually transmitted disease (STD) testing. Diabetes screening. This is done by checking your blood sugar (glucose) after you have not eaten for a while (fasting). You may have this done every 1-3 years. Abdominal aortic aneurysm (AAA) screening. You may need this if you are a current or former smoker. Osteoporosis. You may be screened starting at age 77 if you are at high risk. Talk with your health care provider about your test results, treatment options, and if necessary, the need for more tests. Vaccines  Your health care provider may recommend certain vaccines, such as: Influenza vaccine. This is recommended every year. Tetanus, diphtheria, and acellular pertussis (Tdap, Td) vaccine. You may need a Td booster every 10 years. Zoster vaccine. You may need this after age 77. Pneumococcal 13-valent conjugate (PCV13) vaccine. One dose is recommended after age 77. Pneumococcal polysaccharide (PPSV23)  vaccine. One dose is recommended after  age 77. Talk to your health care provider about which screenings and vaccines you need and how often you need them. This information is not intended to replace advice given to you by your health care provider. Make sure you discuss any questions you have with your health care provider. Document Released: 09/21/2015 Document Revised: 05/14/2016 Document Reviewed: 06/26/2015 Elsevier Interactive Patient Education  2017 [employer] Prevention in the Home Falls can cause injuries. They can happen to people of all ages. There are many things you can do to make your home safe and to help prevent falls. What can I do on the outside of my home? Regularly fix the edges of walkways and driveways and fix any cracks. Remove anything that might make you trip as you walk through a door, such as a raised step or threshold. Trim any bushes or trees on the path to your home. Use bright outdoor lighting. Clear any walking paths of anything that might make someone trip, such as rocks or tools. Regularly check to see if handrails are loose or broken. Make sure that both sides of any steps have handrails. Any raised decks and porches should have guardrails on the edges. Have any leaves, snow, or ice cleared regularly. Use sand or salt on walking paths during winter. Clean up any spills in your garage right away. This includes oil or grease spills. What can I do in the bathroom? Use night lights. Install grab bars by the toilet and in the tub and shower. Do not use towel bars as grab bars. Use non-skid mats or decals in the tub or shower. If you need to sit down in the shower, use a plastic, non-slip stool. Keep the floor dry. Clean up any water that spills on the floor as soon as it happens. Remove soap buildup in the tub or shower regularly. Attach bath mats securely with double-sided non-slip rug tape. Do not have throw rugs and other things on the floor that can make you trip. What can I do in the  bedroom? Use night lights. Make sure that you have a light by your bed that is easy to reach. Do not use any sheets or blankets that are too big for your bed. They should not hang down onto the floor. Have a firm chair that has side arms. You can use this for support while you get dressed. Do not have throw rugs and other things on the floor that can make you trip. What can I do in the kitchen? Clean up any spills right away. Avoid walking on wet floors. Keep items that you use a lot in easy-to-reach places. If you need to reach something above you, use a strong step stool that has a grab bar. Keep electrical cords out of the way. Do not use floor polish or wax that makes floors slippery. If you must use wax, use non-skid floor wax. Do not have throw rugs and other things on the floor that can make you trip. What can I do with my stairs? Do not leave any items on the stairs. Make sure that there are handrails on both sides of the stairs and use them. Fix handrails that are broken or loose. Make sure that handrails are as long as the stairways. Check any carpeting to make sure that it is firmly attached to the stairs. Fix any carpet that is loose or worn. Avoid having throw rugs at the top or bottom  of the stairs. If you do have throw rugs, attach them to the floor with carpet tape. Make sure that you have a light switch at the top of the stairs and the bottom of the stairs. If you do not have them, ask someone to add them for you. What else can I do to help prevent falls? Wear shoes that: Do not have high heels. Have rubber bottoms. Are comfortable and fit you well. Are closed at the toe. Do not wear sandals. If you use a stepladder: Make sure that it is fully opened. Do not climb a closed stepladder. Make sure that both sides of the stepladder are locked into place. Ask someone to hold it for you, if possible. Clearly mark and make sure that you can see: Any grab bars or  handrails. First and last steps. Where the edge of each step is. Use tools that help you move around (mobility aids) if they are needed. These include: Canes. Walkers. Scooters. Crutches. Turn on the lights when you go into a dark area. Replace any light bulbs as soon as they burn out. Set up your furniture so you have a clear path. Avoid moving your furniture around. If any of your floors are uneven, fix them. If there are any pets around you, be aware of where they are. Review your medicines with your doctor. Some medicines can make you feel dizzy. This can increase your chance of falling. Ask your doctor what other things that you can do to help prevent falls. This information is not intended to replace advice given to you by your health care provider. Make sure you discuss any questions you have with your health care provider. Document Released: 06/21/2009 Document Revised: 01/31/2016 Document Reviewed: 09/29/2014 Elsevier Interactive Patient Education  2017 [employer].
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
## 2021-04-12 ENCOUNTER — Other Ambulatory Visit: Payer: Self-pay

## 2021-04-12 ENCOUNTER — Ambulatory Visit (INDEPENDENT_AMBULATORY_CARE_PROVIDER_SITE_OTHER): Payer: Medicare Other | Admitting: Family Medicine

## 2021-04-12 VITALS — BP 130/78 | HR 76 | Temp 98.2°F | Ht 70.25 in | Wt 230.2 lb

## 2021-04-12 DIAGNOSIS — N289 Disorder of kidney and ureter, unspecified: Secondary | ICD-10-CM

## 2021-04-12 DIAGNOSIS — F1920 Other psychoactive substance dependence, uncomplicated: Secondary | ICD-10-CM | POA: Insufficient documentation

## 2021-04-12 DIAGNOSIS — E6609 Other obesity due to excess calories: Secondary | ICD-10-CM

## 2021-04-12 DIAGNOSIS — Z125 Encounter for screening for malignant neoplasm of prostate: Secondary | ICD-10-CM

## 2021-04-12 DIAGNOSIS — N401 Enlarged prostate with lower urinary tract symptoms: Secondary | ICD-10-CM

## 2021-04-12 DIAGNOSIS — Z6832 Body mass index (BMI) 32.0-32.9, adult: Secondary | ICD-10-CM

## 2021-04-12 DIAGNOSIS — K219 Gastro-esophageal reflux disease without esophagitis: Secondary | ICD-10-CM | POA: Diagnosis not present

## 2021-04-12 DIAGNOSIS — Z1211 Encounter for screening for malignant neoplasm of colon: Secondary | ICD-10-CM

## 2021-04-12 DIAGNOSIS — I10 Essential (primary) hypertension: Secondary | ICD-10-CM | POA: Diagnosis not present

## 2021-04-12 DIAGNOSIS — Z0000 Encounter for general adult medical examination without abnormal findings: Secondary | ICD-10-CM

## 2021-04-12 DIAGNOSIS — M1A00X0 Idiopathic chronic gout, unspecified site, without tophus (tophi): Secondary | ICD-10-CM

## 2021-04-12 DIAGNOSIS — E1165 Type 2 diabetes mellitus with hyperglycemia: Secondary | ICD-10-CM

## 2021-04-12 DIAGNOSIS — E669 Obesity, unspecified: Secondary | ICD-10-CM

## 2021-04-12 DIAGNOSIS — N138 Other obstructive and reflux uropathy: Secondary | ICD-10-CM

## 2021-04-12 DIAGNOSIS — E1169 Type 2 diabetes mellitus with other specified complication: Secondary | ICD-10-CM

## 2021-04-12 DIAGNOSIS — E785 Hyperlipidemia, unspecified: Secondary | ICD-10-CM

## 2021-04-12 MED ORDER — ALLOPURINOL 100 MG PO TABS: 100.0000 mg | ORAL_TABLET | Freq: Two times a day (BID) | ORAL | 3 refills | Status: DC

## 2021-04-12 MED ORDER — ROSUVASTATIN CALCIUM 40 MG PO TABS: 40.0000 mg | ORAL_TABLET | Freq: Every day | ORAL | 3 refills | Status: DC

## 2021-04-12 MED ORDER — EZETIMIBE 10 MG PO TABS: 10.0000 mg | ORAL_TABLET | Freq: Every day | ORAL | 3 refills | Status: DC

## 2021-04-12 NOTE — Patient Instructions (Addendum)
Call and schedule your bone density test  Continue your vitamin D   Try to get most of your carbohydrates from produce (with the exception of white potatoes)  Eat less bread/pasta/rice/snack foods/cereals/sweets and other items from the middle of the grocery store (processed carbs)  Take care of yourself

## 2021-04-12 NOTE — Progress Notes (Signed)
Subjective:    Patient ID: Kurt Aguilar, male    DOB: 1945-12-30, 75 y.o.   MRN: [id number]  This visit occurred during the SARS-CoV-2 public health emergency.  Safety protocols were in place, including screening questions prior to the visit, additional usage of staff PPE, and extensive cleaning of exam room while observing appropriate contact time as indicated for disinfecting solutions.   HPI Here for health maintenance exam and to review chronic medical problems    Had amw on 04/10/21  Wt Readings from Last 3 Encounters:  04/12/21 230 lb 3 oz (104.4 kg)  01/15/21 231 lb (104.8 kg)  12/11/20 224 lb (101.6 kg)   32.79 kg/m  Taking care of himself  Started back in the pool for exercise    Flu shot-fall Covid imm with 2 boosters Had shingrix   Eye exam 3/22  Colonoscopy 8/18 -5 y recall for polyps   Prostate health  Lab Results  Component Value Date   PSA 1.18 04/09/2021   PSA 2.32 04/09/2020   PSA 1.33 03/24/2019  Nocturia once per night  No complaints H/o BPH   HTN (with CAD) bp is stable today  No cp or palpitations or headaches or edema  No side effects to medicines  BP Readings from Last 3 Encounters:  04/12/21 130/78  01/15/21 (!) 144/86  12/11/20 118/74     Metoprolol 12.5 mg daily   Pulse Readings from Last 3 Encounters:  04/12/21 76  01/15/21 94  12/11/20 70      Pulmonary status  Had BOOP again for a while (after a cold)  5 mg of prednisone  More of less chronic   Interested in dexa    GERD Takes omeprazole 40 mg and pepcid  This works for him  Cut out a lot of the hot sauce  Also eating much less    DM2 Sees endocrinology  Steroid dependent  Last a1c - up when he was on bigger dose of prednisone   Glucose 111 in am  [place]    Takes a statin   Hyperlipidemia Lab Results  Component Value Date   CHOL 154 04/09/2021   CHOL 131 04/09/2020   CHOL 115 03/24/2019   Lab Results  Component Value Date   HDL 44.30  04/09/2021   HDL 46.20 04/09/2020   HDL 36.00 (L) 03/24/2019   Lab Results  Component Value Date   LDLCALC 45 03/24/2019   LDLCALC 72 02/06/2016   LDLCALC 106 (H) 07/28/2014   Lab Results  Component Value Date   TRIG 242.0 (H) 04/09/2021   TRIG 206.0 (H) 04/09/2020   TRIG 170.0 (H) 03/24/2019   Lab Results  Component Value Date   CHOLHDL 3 04/09/2021   CHOLHDL 3 04/09/2020   CHOLHDL 3 03/24/2019   Lab Results  Component Value Date   LDLDIRECT 74.0 04/09/2021   LDLDIRECT 59.0 04/09/2020   LDLDIRECT 50.0 03/08/2018   Crestor 40 mg daily  Zetia 10 mg daily  Trig are up    Renal insuff Lab Results  Component Value Date   CREATININE 1.81 (H) 04/09/2021   BUN 29 (H) 04/09/2021   NA 143 04/09/2021   K 4.5 04/09/2021   CL 105 04/09/2021   CO2 28 04/09/2021  Cr is up a bit  Water intake could be better  GFR 35.6  Nephrology f/u   Gout  Under control Lab Results  Component Value Date   LABURIC 4.9 04/09/2021   Allopurinol  No flares at  all   Patient Active Problem List   Diagnosis Date Noted   Steroid dependent ([place]) 04/12/2021   Primary osteoarthritis of left knee 05/29/2020   Colon cancer screening 02/13/2017   Diabetes mellitus type 2, uncontrolled ([place]) 08/13/2016   Chronic heel pain, left 08/12/2016   Upper airway cough syndrome 07/31/2016   Class 1 obesity with serious comorbidity and body mass index (BMI) of 32.0 to 32.9 in adult 05/19/2016   Routine general medical examination at a health care facility 01/27/2016   Pulmonary infiltrates with high  ESR c/w BOOP/ idiopathic  12/19/2015   Fatigue 12/12/2015   Cough 11/29/2015   Cystic kidney disease 10/01/2015   Renal lesion 09/27/2015   Erectile dysfunction of organic origin 09/27/2015   BPH with obstruction/lower urinary tract symptoms 09/27/2015   Constipation 09/24/2015   Cyst of right kidney 09/24/2015   CAD (coronary artery disease) 06/08/2015   Grieving 11/22/2014   Encounter for Medicare  annual wellness exam 07/11/2013   Prostate cancer screening 07/03/2013   Right bundle branch block 02/22/2013   Chronic low back pain 05/13/2011   Obstructive sleep apnea 03/15/2010   Hyperlipidemia associated with type 2 diabetes mellitus ([place]) 04/12/2009   Gout 04/12/2009   Essential hypertension 04/12/2009   Coronary artery disease of bypass graft of native heart with stable angina pectoris ([place]) 04/12/2009   GERD 04/12/2009   Renal insufficiency 04/12/2009   DIVERTICULITIS, HX OF 04/12/2009   Past Medical History:  Diagnosis Date   Arthritis    Chronic kidney disease    Complication of anesthesia    constipation and inability to  urinate happened a few days after cataract surgery    Diabetes ([place])    type 2    Diverticulitis    GERD (gastroesophageal reflux disease)    Gout    Heart attack ([place])    Heart disease    Heart murmur    Hyperglycemia    Hyperlipidemia    Hypertension    Osteoporosis    Pneumonia    hx of BOOP followed by Dr [name]    Sleep apnea    cpapp - setting at 17    Past Surgical History:  Procedure Laterality Date   ACROMIO-CLAVICULAR JOINT REPAIR Left 05/05/2019   Procedure: SHOULDER ARTHROSCOPIC ASSISTED ACROMIO-CLAVICULAR JOINT REPAIR;  Surgeon: Tania Ade, MD;  Location: [facility];  Service: Orthopedics;  Laterality: Left;   CATARACT EXTRACTION Bilateral    CORONARY ARTERY BYPASS GRAFT     [place]   open heart surgery  09/09/1995-1998   5 bypass   REPAIR KNEE LIGAMENT     right   SHOULDER ARTHROSCOPY Left 05/05/2019   Procedure: ARTHROSCOPY SHOULDER;  Surgeon: Tania Ade, MD;  Location: [facility];  Service: Orthopedics;  Laterality: Left;   TONSILLECTOMY     TOTAL KNEE ARTHROPLASTY Left 05/29/2020   Procedure: LEFT TOTAL KNEE ARTHROPLASTY;  Surgeon: Melrose Nakayama, MD;  Location: [facility];  Service: Orthopedics;  Laterality: Left;   Social History   Tobacco Use   Smoking status: Former    Packs/day: 1.00    Years: 3.00     Pack years: 3.00    Types: Cigars, Cigarettes    Quit date: 11/25/2004    Years since quitting: 16.3   Smokeless tobacco: Never   Tobacco comments:    occas. cigar quit 2006  Vaping Use   Vaping Use: Never used  Substance Use Topics   Alcohol use: Yes    Alcohol/week: 1.0 standard drink  Types: 1 Standard drinks or equivalent per week    Comment: rare   Drug use: No   Family History  Problem Relation Age of Onset   Heart disease Mother    Heart disease Father    Stroke Father    Heart attack Son 5   Kidney disease Neg Hx    Prostate cancer Neg Hx    Diabetes Neg Hx    Allergies  Allergen Reactions   Oxycodone-Acetaminophen Other (See Comments)    Stops breathing   Current Outpatient Medications on File Prior to Visit  Medication Sig Dispense Refill   aspirin 81 MG tablet Take 1 tablet (81 mg total) by mouth 2 (two) times daily after a meal. 30 tablet 0   benzonatate (TESSALON) 200 MG capsule TAKE 1 CAPSULE BY MOUTH EVERY 6 HOURS AS NEEDED 30 capsule 0   Bioflavonoid Products (VITAMIN C) CHEW Chew 1 tablet by mouth daily.     Blood Glucose Monitoring Suppl (FREESTYLE LITE) DEVI 1 each by Does not apply route 4 (four) times daily. E11.65 1 each 0   canagliflozin (INVOKANA) 100 MG TABS tablet Take 1 tablet (100 mg total) by mouth every other day. 30 tablet 0   Carboxymethylcellul-Glycerin (LUBRICATING EYE DROPS OP) Place 1 drop into both eyes daily as needed (dry eyes).     Cholecalciferol (VITAMIN D3 PO) Take 1 capsule by mouth daily.     CINNAMON PO Take 1,000 mg by mouth 2 (two) times daily.     colchicine 0.6 MG tablet TAKE 1 TABLET BY MOUTH TWICE A DAY WITH A MEAL AS NEEDED FOR ACUTE FLARE OF GOUT (Patient taking differently: Take 0.6 mg by mouth 2 (two) times daily as needed (gout flare).) 45 tablet 3   Dextromethorphan-guaiFENesin (MUCINEX DM MAXIMUM STRENGTH) 60-1200 MG TB12 Take 1 tablet by mouth 2 (two) times daily as needed (congestion).      famotidine (PEPCID)  20 MG tablet TAKE 1 TABLET BY MOUTH EVERYDAY AT BEDTIME 90 tablet 1   Glucosamine HCl (GLUCOSAMINE PO) Take 1,500 mg by mouth 2 (two) times daily.      glucose blood (FREESTYLE LITE) test strip 1 each by Other route 4 (four) times daily. E11.65 400 strip 0   Lancets (FREESTYLE) lancets 1 EACH BY OTHER ROUTE 4 (FOUR) TIMES DAILY. E11.65 400 each 0   Lutein 20 MG CAPS Take 20 mg by mouth daily.      methocarbamol (ROBAXIN) 500 MG tablet Take 1 tablet (500 mg total) by mouth every 8 (eight) hours as needed for muscle spasms. Caution of sedation 40 tablet 1   metoprolol tartrate (LOPRESSOR) 25 MG tablet Take 0.5 tablets (12.5 mg total) by mouth daily. 45 tablet 3   Multiple Vitamins-Minerals (ZINC PO) Take 1 tablet by mouth daily.     omeprazole (PRILOSEC) 40 MG capsule TAKE 1 CAPUSLE BY MOUTH 30- 60 MIN BEFORE YOUR FIRST AND LAST MEALS OF THE DAY 180 capsule 3   ondansetron (ZOFRAN) 8 MG tablet Take 1 tablet (8 mg total) by mouth every 8 (eight) hours as needed for nausea or vomiting. From narcotic pain medicine 15 tablet 0   predniSONE (DELTASONE) 10 MG tablet 2 daily until better 100 tablet 0   predniSONE (DELTASONE) 10 MG tablet TAKE 1 TABLET TWICE DAILY UNTIL BETTER THEN ONE DAILY 100 tablet 0   predniSONE (DELTASONE) 5 MG tablet TAKE 4 TABLETS BY MOUTH UNTIL BETTER THEN 2 TABLETS DAILY 200 tablet 2   Current Facility-Administered Medications on  File Prior to Visit  Medication Dose Route Frequency Provider Last Rate Last Admin   betamethasone acetate-betamethasone sodium phosphate (CELESTONE) injection 3 mg  3 mg Intramuscular Once Edrick Kins, DPM        Review of Systems  Constitutional:  Negative for activity change, appetite change, fatigue, fever and unexpected weight change.  HENT:  Negative for congestion, rhinorrhea, sore throat and trouble swallowing.   Eyes:  Negative for pain, redness, itching and visual disturbance.  Respiratory:  Positive for cough. Negative for chest  tightness, shortness of breath and wheezing.   Cardiovascular:  Negative for chest pain and palpitations.  Gastrointestinal:  Negative for abdominal pain, blood in stool, constipation, diarrhea and nausea.  Endocrine: Negative for cold intolerance, heat intolerance, polydipsia and polyuria.  Genitourinary:  Negative for difficulty urinating, dysuria, frequency and urgency.  Musculoskeletal:  Negative for arthralgias, joint swelling and myalgias.  Skin:  Negative for pallor and rash.  Neurological:  Negative for dizziness, tremors, weakness, numbness and headaches.  Hematological:  Negative for adenopathy. Does not bruise/bleed easily.  Psychiatric/Behavioral:  Negative for decreased concentration and dysphoric mood. The patient is not nervous/anxious.       Objective:   Physical Exam Constitutional:      General: He is not in acute distress.    Appearance: Normal appearance. He is well-developed. He is obese. He is not ill-appearing or diaphoretic.  HENT:     Head: Normocephalic and atraumatic.     Right Ear: Tympanic membrane, ear canal and external ear normal.     Left Ear: Tympanic membrane, ear canal and external ear normal.     Nose: Nose normal. No congestion.     Mouth/Throat:     Mouth: Mucous membranes are moist.     Pharynx: Oropharynx is clear. No posterior oropharyngeal erythema.  Eyes:     General: No scleral icterus.       Right eye: No discharge.        Left eye: No discharge.     Conjunctiva/sclera: Conjunctivae normal.     Pupils: Pupils are equal, round, and reactive to light.  Neck:     Thyroid: No thyromegaly.     Vascular: No carotid bruit or JVD.  Cardiovascular:     Rate and Rhythm: Normal rate and regular rhythm.     Pulses: Normal pulses.     Heart sounds: Normal heart sounds.    No gallop.  Pulmonary:     Effort: Pulmonary effort is normal. No respiratory distress.     Breath sounds: Normal breath sounds. No wheezing or rales.     Comments: Good air  exch Chest:     Chest wall: No tenderness.  Abdominal:     General: Bowel sounds are normal. There is no distension or abdominal bruit.     Palpations: Abdomen is soft. There is no mass.     Tenderness: There is no abdominal tenderness.     Hernia: No hernia is present.  Musculoskeletal:        General: No tenderness.     Cervical back: Normal range of motion and neck supple. No rigidity. No muscular tenderness.     Right lower leg: No edema.     Left lower leg: No edema.     Comments: No kyphosis   Lymphadenopathy:     Cervical: No cervical adenopathy.  Skin:    General: Skin is warm and dry.     Coloration: Skin is not pale.  Findings: No erythema or rash.     Comments: Mildly tanned Solar lentigines diffusely   Neurological:     Mental Status: He is alert.     Cranial Nerves: No cranial nerve deficit.     Motor: No abnormal muscle tone.     Coordination: Coordination normal.     Gait: Gait normal.     Deep Tendon Reflexes: Reflexes are normal and symmetric. Reflexes normal.  Psychiatric:        Mood and Affect: Mood normal.        Cognition and Memory: Cognition and memory normal.          Assessment & Plan:   Problem List Items Addressed This Visit       Cardiovascular and Mediastinum   Essential hypertension (Chronic)    In the setting of stable CAD bp in fair control at this time  BP Readings from Last 1 Encounters:  04/12/21 130/78   No changes needed Most recent labs reviewed  Disc lifstyle change with low sodium diet and exercise  Plan to continue metoprolol 12.5 mg daily       Relevant Medications   ezetimibe (ZETIA) 10 MG tablet   rosuvastatin (CRESTOR) 40 MG tablet     Digestive   GERD    Well controlled with omeprazole 40 mg and pepcid  Taking vitamin D Under care of GI Enc to watch diet          Endocrine   Hyperlipidemia associated with type 2 diabetes mellitus ([place])    Disc goals for lipids and reasons to control them Rev  last labs with pt Rev low sat fat diet in detail In setting of CAD Well controlled with LDL of 45 Would like higher HDL Plan to continue crestor 40 mg daily and zetia 10 mg daily       Relevant Medications   ezetimibe (ZETIA) 10 MG tablet   rosuvastatin (CRESTOR) 40 MG tablet   Diabetes mellitus type 2, uncontrolled ([facility])    Due in part to chronic steroid use Seeing encrinology [facility] has been helpful        Relevant Medications   rosuvastatin (CRESTOR) 40 MG tablet     Genitourinary   Renal insufficiency    Stable with cr of 1.81 Enc him to continue nephrology care Also good fluid intake  Avoid nsaids        BPH with obstruction/lower urinary tract symptoms    PSA is down from last check  Lab Results  Component Value Date   PSA 1.18 04/09/2021   PSA 2.32 04/09/2020   PSA 1.33 03/24/2019    No clinical changes  Nocturia times one         Other   Gout    Lab Results  Component Value Date   LABURIC 4.9 04/09/2021  Continues allopurinol 100 mg bid  No flares Enc good fluid intake       Prostate cancer screening    Lab Results  Component Value Date   PSA 1.18 04/09/2021   PSA 2.32 04/09/2020   PSA 1.33 03/24/2019   No clinical changes      Routine general medical examination at a health care facility - Primary    Reviewed health habits including diet and exercise and skin cancer prevention Reviewed appropriate screening tests for age  Also reviewed health mt list, fam hx and immunization status , as well as social and family history   See HPI Labs reviewed  Will get  flu shot in the fall  covid immunized  Eye exam utd Colonoscopy utd Reviewed psa and prostate health  dexa ordered in light of chronic prednisone use       Class 1 obesity with serious comorbidity and body mass index (BMI) of 32.0 to 32.9 in adult    Discussed how this problem influences overall health and the risks it imposes  Reviewed plan for weight loss with lower  calorie diet (via better food choices and also portion control or program like weight watchers) and exercise building up to or more than 30 minutes 5 days per week including some aerobic activity   Prednisone makes it more difficult to loose weight        Colon cancer screening    Due for 5 y recall colonoscopy in 8/23       Steroid dependent ([place])    With BOOP  dexa ordered-disc inc risk of OP Taking vit D       Relevant Orders   DG Bone Density

## 2021-04-14 ENCOUNTER — Encounter: Payer: Self-pay | Admitting: Family Medicine

## 2021-04-14 NOTE — Assessment & Plan Note (Signed)
In the setting of stable CAD bp in fair control at this time  BP Readings from Last 1 Encounters:  04/12/21 130/78   No changes needed Most recent labs reviewed  Disc lifstyle change with low sodium diet and exercise  Plan to continue metoprolol 12.5 mg daily

## 2021-04-14 NOTE — Assessment & Plan Note (Signed)
Discussed how this problem influences overall health and the risks it imposes  Reviewed plan for weight loss with lower calorie diet (via better food choices and also portion control or program like weight watchers) and exercise building up to or more than 30 minutes 5 days per week including some aerobic activity   Prednisone makes it more difficult to loose weight

## 2021-04-14 NOTE — Assessment & Plan Note (Signed)
Due for 5 y recall colonoscopy in 8/23

## 2021-04-14 NOTE — Assessment & Plan Note (Signed)
Disc goals for lipids and reasons to control them Rev last labs with pt Rev low sat fat diet in detail In setting of CAD Well controlled with LDL of 45 Would like higher HDL Plan to continue crestor 40 mg daily and zetia 10 mg daily

## 2021-04-14 NOTE — Assessment & Plan Note (Signed)
Lab Results  Component Value Date   LABURIC 4.9 04/09/2021   Continues allopurinol 100 mg bid  No flares Enc good fluid intake

## 2021-04-14 NOTE — Assessment & Plan Note (Signed)
Due in part to chronic steroid use Seeing encrinology [facility] has been helpful

## 2021-04-14 NOTE — Assessment & Plan Note (Signed)
Lab Results  Component Value Date   PSA 1.18 04/09/2021   PSA 2.32 04/09/2020   PSA 1.33 03/24/2019    No clinical changes

## 2021-04-14 NOTE — Assessment & Plan Note (Signed)
PSA is down from last check  Lab Results  Component Value Date   PSA 1.18 04/09/2021   PSA 2.32 04/09/2020   PSA 1.33 03/24/2019    No clinical changes  Nocturia times one

## 2021-04-14 NOTE — Assessment & Plan Note (Signed)
Reviewed health habits including diet and exercise and skin cancer prevention Reviewed appropriate screening tests for age  Also reviewed health mt list, fam hx and immunization status , as well as social and family history   See HPI Labs reviewed  Will get flu shot in the fall  covid immunized  Eye exam utd Colonoscopy utd Reviewed psa and prostate health  dexa ordered in light of chronic prednisone use

## 2021-04-14 NOTE — Assessment & Plan Note (Signed)
Well controlled with omeprazole 40 mg and pepcid  Taking vitamin D Under care of GI Enc to watch diet

## 2021-04-14 NOTE — Assessment & Plan Note (Signed)
With BOOP  dexa ordered-disc inc risk of OP Taking vit D

## 2021-04-14 NOTE — Assessment & Plan Note (Signed)
Stable with cr of 1.81 Enc him to continue nephrology care Also good fluid intake  Avoid nsaids

## 2021-04-17 ENCOUNTER — Ambulatory Visit: Payer: Medicare Other | Admitting: Internal Medicine

## 2021-04-23 DIAGNOSIS — G4733 Obstructive sleep apnea (adult) (pediatric): Secondary | ICD-10-CM | POA: Diagnosis not present

## 2021-04-24 ENCOUNTER — Other Ambulatory Visit: Payer: Self-pay | Admitting: Internal Medicine

## 2021-04-29 ENCOUNTER — Encounter: Payer: Self-pay | Admitting: Cardiovascular Disease

## 2021-04-29 ENCOUNTER — Other Ambulatory Visit: Payer: Self-pay

## 2021-04-29 ENCOUNTER — Ambulatory Visit: Payer: Medicare Other | Admitting: Cardiovascular Disease

## 2021-04-29 VITALS — BP 120/70 | HR 68 | Ht 70.25 in | Wt 232.1 lb

## 2021-04-29 DIAGNOSIS — R0609 Other forms of dyspnea: Secondary | ICD-10-CM

## 2021-04-29 DIAGNOSIS — G4733 Obstructive sleep apnea (adult) (pediatric): Secondary | ICD-10-CM | POA: Diagnosis not present

## 2021-04-29 DIAGNOSIS — E782 Mixed hyperlipidemia: Secondary | ICD-10-CM

## 2021-04-29 DIAGNOSIS — I25708 Atherosclerosis of coronary artery bypass graft(s), unspecified, with other forms of angina pectoris: Secondary | ICD-10-CM

## 2021-04-29 DIAGNOSIS — I10 Essential (primary) hypertension: Secondary | ICD-10-CM | POA: Diagnosis not present

## 2021-04-29 DIAGNOSIS — R0600 Dyspnea, unspecified: Secondary | ICD-10-CM

## 2021-04-29 NOTE — Patient Instructions (Signed)
Medication Instructions:  No changes  If you need a refill on your cardiac medications before your next appointment, please call your pharmacy.    Lab work: No new labs needed   If you have labs (blood work) drawn today and your tests are completely normal, you will receive your results only by: MyChart Message (if you have MyChart) OR A paper copy in the mail If you have any lab test that is abnormal or we need to change your treatment, we will call you to review the results.   Testing/Procedures: No new testing needed   Follow-Up: At [facility], you and your health needs are our priority.  As part of our continuing mission to provide you with exceptional heart care, we have created designated Provider Care Teams.  These Care Teams include your primary Cardiologist (physician) and Advanced Practice Providers (APPs -  Physician Assistants and Nurse Practitioners) who all work together to provide you with the care you need, when you need it.  You will need a follow up appointment in 12 months  Providers on your designated Care Team:   Christopher Berge, NP Ryan Dunn, PA-C Jacquelyn Visser, PA-C Cadence Furth, PA-C  Any Other Special Instructions Will Be Listed Below (If Applicable).  COVID-19 Vaccine Information can be found at: [email or web address] For questions related to vaccine distribution or appointments, please email [email or web address] or call [phone]    

## 2021-04-29 NOTE — Progress Notes (Signed)
Cardiology Office Note  Date:  04/29/2021   ID:  Kurt Aguilar, Kurt Aguilar 1946/05/24, MRN [id number]  PCP:  [name], MD   Chief Complaint  Patient presents with   12 month follow up     "Doing well." Medications reviewed by the patient verbally.     HPI:  75 yo with history of  CAD,  CABG back in 1997 in [place], [place],  HTN,  Diabetes with chronic renal insufficiency Previous A1c 11 now down to 6 hyperlipidemia,  gout baseline creatinine mildly elevated He lost his son in early 2016 to cardiac arrest Aortic valve murmur/aortic valve disease h/o reactive airway symptoms, [place], 2017 presents for routine followup of his coronary artery disease, CRI   Active at baseline, lots of traveling Going on cruises  Lab work reviewed CR 1.8, stable over the past 3 years  Denies any chest pain concerning for angina, no significant shortness of breath  Using exercise facility at [facility]  swimming in the pool Weight has been trending upward Blood pressure stable, well controlled  Lab work reviewed HBA1c 6.3 LDL 45  EKG personally reviewed by myself on todays visit Shows normal sinus rhythm rate 68 bpm right bundle branch block no other significant ST-T wave changes  Other past medical history reviewed Issue in [place] in 10/2016 "thought I was having a stroke" Was seen in the emergency room for confusion BMP performed showing creatinine 1.69, BUN 45 Chlorthalidone was held Repeat BMP early March 2018 showing creatinine 1.69 Baseline creatinine typically 1.4 For some reason he has continued his potassium  h/o reactive airway symptoms, [place] PreviouslyStarted on prednisoneMarch 2017  for shortness of breath, improvement of symptoms,  cough returned after prednisone was discontinued getting close To his baseline Minimal wheezing and cough predisone 5 mg daily, Plan may be to wean the dose Managed by Dr. [name] , Pulmonary  Other major issue was hemoglobin A1c going up to  11 Started On insulin, daily in Am     PMH:   has a past medical history of Arthritis, Chronic kidney disease, Complication of anesthesia, Diabetes ([place]), Diverticulitis, GERD (gastroesophageal reflux disease), Gout, Heart attack ([place]), Heart disease, Heart murmur, Hyperglycemia, Hyperlipidemia, Hypertension, Osteoporosis, Pneumonia, and Sleep apnea.  PSH:    Past Surgical History:  Procedure Laterality Date   ACROMIO-CLAVICULAR JOINT REPAIR Left 05/05/2019   Procedure: SHOULDER ARTHROSCOPIC ASSISTED ACROMIO-CLAVICULAR JOINT REPAIR;  Surgeon: Tania Ade, MD;  Location: [facility];  Service: Orthopedics;  Laterality: Left;   CATARACT EXTRACTION Bilateral    CORONARY ARTERY BYPASS GRAFT     [place]   open heart surgery  09/09/1995-1998   5 bypass   REPAIR KNEE LIGAMENT     right   SHOULDER ARTHROSCOPY Left 05/05/2019   Procedure: ARTHROSCOPY SHOULDER;  Surgeon: Tania Ade, MD;  Location: [facility];  Service: Orthopedics;  Laterality: Left;   TONSILLECTOMY     TOTAL KNEE ARTHROPLASTY Left 05/29/2020   Procedure: LEFT TOTAL KNEE ARTHROPLASTY;  Surgeon: Melrose Nakayama, MD;  Location: [facility];  Service: Orthopedics;  Laterality: Left;    Current Outpatient Medications  Medication Sig Dispense Refill   allopurinol (ZYLOPRIM) 100 MG tablet Take 1 tablet (100 mg total) by mouth 2 (two) times daily. 180 tablet 3   aspirin 81 MG tablet Take 1 tablet (81 mg total) by mouth 2 (two) times daily after a meal. 30 tablet 0   benzonatate (TESSALON) 200 MG capsule TAKE 1 CAPSULE BY MOUTH EVERY 6 HOURS AS NEEDED  30 capsule 0   Bioflavonoid Products (VITAMIN C) CHEW Chew 1 tablet by mouth daily.     Blood Glucose Monitoring Suppl (FREESTYLE LITE) DEVI 1 each by Does not apply route 4 (four) times daily. E11.65 1 each 0   canagliflozin (INVOKANA) 100 MG TABS tablet Take 1 tablet (100 mg total) by mouth every other day. 30 tablet 0   Carboxymethylcellul-Glycerin (LUBRICATING EYE DROPS OP) Place  1 drop into both eyes daily as needed (dry eyes).     Cholecalciferol (VITAMIN D3 PO) Take 1 capsule by mouth daily.     CINNAMON PO Take 1,000 mg by mouth 2 (two) times daily.     colchicine 0.6 MG tablet TAKE 1 TABLET BY MOUTH TWICE A DAY WITH A MEAL AS NEEDED FOR ACUTE FLARE OF GOUT 45 tablet 3   Dextromethorphan-guaiFENesin (MUCINEX DM MAXIMUM STRENGTH) 60-1200 MG TB12 Take 1 tablet by mouth 2 (two) times daily as needed (congestion).      ezetimibe (ZETIA) 10 MG tablet Take 1 tablet (10 mg total) by mouth daily. 90 tablet 3   famotidine (PEPCID) 20 MG tablet TAKE 1 TABLET BY MOUTH EVERYDAY AT BEDTIME 90 tablet 1   Glucosamine HCl (GLUCOSAMINE PO) Take 1,500 mg by mouth 2 (two) times daily.      glucose blood (FREESTYLE LITE) test strip 1 each by Other route 4 (four) times daily. E11.65 400 strip 0   Lancets (FREESTYLE) lancets 1 EACH BY OTHER ROUTE 4 (FOUR) TIMES DAILY. E11.65 400 each 0   Lutein 20 MG CAPS Take 20 mg by mouth daily.      methocarbamol (ROBAXIN) 500 MG tablet Take 1 tablet (500 mg total) by mouth every 8 (eight) hours as needed for muscle spasms. Caution of sedation 40 tablet 1   metoprolol tartrate (LOPRESSOR) 25 MG tablet Take 0.5 tablets (12.5 mg total) by mouth daily. 45 tablet 3   Multiple Vitamins-Minerals (ZINC PO) Take 1 tablet by mouth daily.     omeprazole (PRILOSEC) 40 MG capsule TAKE 1 CAPUSLE BY MOUTH 30- 60 MIN BEFORE YOUR FIRST AND LAST MEALS OF THE DAY 180 capsule 3   ondansetron (ZOFRAN) 8 MG tablet Take 1 tablet (8 mg total) by mouth every 8 (eight) hours as needed for nausea or vomiting. From narcotic pain medicine 15 tablet 0   predniSONE (DELTASONE) 5 MG tablet Take 5 mg by mouth daily with breakfast.     rosuvastatin (CRESTOR) 40 MG tablet Take 1 tablet (40 mg total) by mouth daily. 90 tablet 3   predniSONE (DELTASONE) 10 MG tablet 2 daily until better (Patient not taking: Reported on 04/29/2021) 100 tablet 0   predniSONE (DELTASONE) 10 MG tablet TAKE 1  TABLET TWICE DAILY UNTIL BETTER THEN ONE DAILY (Patient not taking: Reported on 04/29/2021) 100 tablet 0   predniSONE (DELTASONE) 5 MG tablet TAKE 4 TABLETS BY MOUTH UNTIL BETTER THEN 2 TABLETS DAILY (Patient not taking: Reported on 04/29/2021) 200 tablet 2   Current Facility-Administered Medications  Medication Dose Route Frequency Provider Last Rate Last Admin   betamethasone acetate-betamethasone sodium phosphate (CELESTONE) injection 3 mg  3 mg Intramuscular Once Edrick Kins, DPM         Allergies:   Oxycodone-acetaminophen   Social History:  The patient  reports that he quit smoking about 16 years ago. His smoking use included cigars and cigarettes. He has a 3.00 pack-year smoking history. He has never used smokeless tobacco. He reports current alcohol use of about 1.0 standard  drink per week. He reports that he does not use drugs.   Family History:   family history includes Heart attack (age of onset: 62) in his son; Heart disease in his father and mother; Stroke in his father.    Review of Systems: Review of Systems  Constitutional:        Weight gain  HENT: Negative.    Respiratory: Negative.    Cardiovascular: Negative.   Gastrointestinal: Negative.   Musculoskeletal: Negative.   Neurological: Negative.   Psychiatric/Behavioral: Negative.    All other systems reviewed and are negative.  PHYSICAL EXAM: VS:  BP 120/70 (BP Location: Left Arm, Patient Position: Sitting, Cuff Size: Normal)   Pulse 68   Ht 5' 10.25" (1.784 m)   Wt 232 lb 2 oz (105.3 kg)   SpO2 98%   BMI 33.07 kg/m  , BMI Body mass index is 33.07 kg/m.  Constitutional:  oriented to person, place, and time. No distress.  HENT:  Head: Grossly normal Eyes:  no discharge. No scleral icterus.  Neck: No JVD, no carotid bruits  Cardiovascular: Regular rate and rhythm, no murmurs appreciated Pulmonary/Chest: Clear to auscultation bilaterally, no wheezes or rails Abdominal: Soft.  no distension.  no tenderness.   Musculoskeletal: Normal range of motion Neurological:  normal muscle tone. Coordination normal. No atrophy Skin: Skin warm and dry Psychiatric: normal affect, pleasant  Recent Labs: 04/09/2021: ALT 16; BUN 29; Creatinine, Ser 1.81; Hemoglobin 13.0; Platelets 232.0; Potassium 4.5; Sodium 143; TSH 3.86    Lipid Panel Lab Results  Component Value Date   CHOL 154 04/09/2021   HDL 44.30 04/09/2021   LDLCALC 45 03/24/2019   TRIG 242.0 (H) 04/09/2021      Wt Readings from Last 3 Encounters:  04/29/21 232 lb 2 oz (105.3 kg)  04/12/21 230 lb 3 oz (104.4 kg)  01/15/21 231 lb (104.8 kg)     ASSESSMENT AND PLAN:  Coronary artery disease with stable angina, bypass Currently with no symptoms of angina. No further workup at this time. Continue current medication regimen.  Pure hypercholesterolemia - Plan: EKG 12-Lead Cholesterol is at goal on the current lipid regimen. No changes to the medications were made.  Essential hypertension - Plan: EKG 12-Lead Blood pressure is well controlled on today's visit. No changes made to the medications.  Chronic mild shortness of breath, stable. Recommend regular walking program, weight loss  Controlled type 2 diabetes mellitus with diabetic nephropathy, unspecified long term insulin use status (HCC) - A1c with slight trend upwards with weight Recommended low carbohydrate diet, walking program  Morbid obesity due to excess calories ([place]) in setting of pred rx  -  We have encouraged continued exercise, careful diet management in an effort to lose weight.    Total encounter time more than 25 minutes  Greater than 50% was spent in counseling and coordination of care with the patient      Orders Placed This Encounter  Procedures   EKG 12-Lead      Signed, Esmond Plants, M.D., Ph.D. 04/29/2021  [facility] [place], [place] [phone]

## 2021-05-01 ENCOUNTER — Ambulatory Visit: Payer: Medicare Other | Admitting: Internal Medicine

## 2021-05-16 ENCOUNTER — Other Ambulatory Visit: Payer: Self-pay

## 2021-05-16 ENCOUNTER — Encounter: Payer: Self-pay | Admitting: Family Medicine

## 2021-05-16 ENCOUNTER — Ambulatory Visit
Admission: RE | Admit: 2021-05-16 | Discharge: 2021-05-16 | Disposition: A | Discharge status: Home / Self Care | Payer: Medicare Other | Source: Ambulatory Visit | Attending: Family Medicine | Admitting: Family Medicine

## 2021-05-16 DIAGNOSIS — F1920 Other psychoactive substance dependence, uncomplicated: Secondary | ICD-10-CM

## 2021-05-16 DIAGNOSIS — E119 Type 2 diabetes mellitus without complications: Secondary | ICD-10-CM | POA: Diagnosis not present

## 2021-05-16 DIAGNOSIS — J449 Chronic obstructive pulmonary disease, unspecified: Secondary | ICD-10-CM | POA: Insufficient documentation

## 2021-05-16 DIAGNOSIS — M8580 Other specified disorders of bone density and structure, unspecified site: Secondary | ICD-10-CM | POA: Insufficient documentation

## 2021-05-16 DIAGNOSIS — Z13820 Encounter for screening for osteoporosis: Secondary | ICD-10-CM | POA: Insufficient documentation

## 2021-05-16 DIAGNOSIS — M85851 Other specified disorders of bone density and structure, right thigh: Secondary | ICD-10-CM | POA: Diagnosis not present

## 2021-05-22 ENCOUNTER — Ambulatory Visit: Payer: Medicare Other | Admitting: Internal Medicine

## 2021-05-22 ENCOUNTER — Telehealth: Payer: Self-pay | Admitting: Internal Medicine

## 2021-05-22 ENCOUNTER — Other Ambulatory Visit: Payer: Self-pay

## 2021-05-22 ENCOUNTER — Encounter: Payer: Self-pay | Admitting: Internal Medicine

## 2021-05-22 DIAGNOSIS — R918 Other nonspecific abnormal finding of lung field: Secondary | ICD-10-CM

## 2021-05-22 DIAGNOSIS — R058 Other specified cough: Secondary | ICD-10-CM | POA: Diagnosis not present

## 2021-05-22 MED ORDER — BENZONATATE 200 MG PO CAPS: ORAL_CAPSULE | ORAL | 0 refills | Status: DC

## 2021-05-22 NOTE — Patient Instructions (Signed)
No change in recommendations  Make sure you check your oxygen saturation at your highest level of activity to be sure it stays over 90% and keep track of it at least once a week, more often if breathing getting worse, and let me know if losing ground.    Please schedule a follow up visit in 6  months but call sooner if needed  - [facility].

## 2021-05-22 NOTE — Progress Notes (Signed)
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   History of Present Illness  12/19/2015 1st [facility] office visit/ Andi Layfield   Chief Complaint  Patient presents with   Pulmonary Consult    Referred by [name]. Pt c/o cough x 4 wks- non prod and worse in the evening and when he lies down. Talking and exertion are things that trigger the cough. He has been on pred x 2 and both times cough resolved and immediately returns once done with med. He also c/o SOB- gets winded just walking from room to room at home.   while on prednisone still some weak and sob but better cough and last dose at least one week and worse overall since off it assoc with sob room to room and weakness/ excess/ purulent sputum or mucus plugs / no unusual exp / no ctd.  Has new CPAP machine fall 2016  rec  Omeprazole 20 mg Take 30- 60 min before your first and last meals of the day  Prednisone 10  X 2 each day until 100% better then 10 mg daily  Dx is Brochilitis obliterans with organizing pneumonia vs eosiniphic pneumonia most likely diagnosis GERD  Diet       02/01/2016  f/u ov/Ladarrius Bogdanski re: boop tapered pred to 10 mg as 01/28/16  Chief Complaint  Patient presents with   Follow-up    pt states he is much improved since last office visit.  SOB has resolved.    Not limited by breathing from desired activities / concerned about wt gain from pred  rec Prednisone 10  mg with bfast x 2 week then 5 mg x 2 weeks then 5 mg even days x 2 weeks and stop  If breathing/cough worse or nausea resume previous dose    09/11/2016  f/u ov/Alix Lahmann re: BOOP/uacs  on pred 10/5  prilosec 20 mg ac and hs  Chief Complaint  Patient presents with   Follow-up    Increased SOB for the past 3-4 days. He gets SOB with exertion such as getting dressed or walking accross the room.  He has also started coughing more "feels like I burned my lungs"- prod with white sputum.     was doing ok on 10/5 in terms of breathing but worse gradually x one week p several weeks on the 10/5 dose  Cough some better but still needing freq tessalon on gabapentin 100 tid  rec Prednisone 10 mg  2 daily until better then 1 daily x 2 weeks then 10 alternating with 5 until return Prilosec should be 20 mg Take 30- 60 min before your first and last meals of the day  Increase gabapentin to 300 mg three times daily   .       02/10/2017  f/u ov/Pietra Zuluaga re:  PF / boop down to 10 mg one half qod and maint on  gabapentin 300 tid for UACS Chief Complaint  Patient presents with   Follow-up    Pt states "doing great"- doing well on the pred 5 mg every other day.    no change ex tol with taper / no cough/ no nausea even on days off  rec Prednisone 5mg  one half every other day x 2 weeks and then stop -      04/15/2017  f/u ov/Jeffrie Lofstrom re:  PF/ boop / ? Irritable larynx  Chief Complaint  Patient presents with   Follow-up    Pt states developed a cold mid July 2018 while traveling to [place]. He is feeling better now and not coughing much. No new co's today.   walked all over [place] up hills/ in heat no problem with sob   Still occ throat clearing better on gabapentin 300 tid and gerd rx / daytime only  rec Once the protonix runs out,  Ok to try omeprazole 20 mg before bfrast and pepcid at bedtime until you see [name]  Once you run out of gabapentin ok to leave it off and call if refills needed      08/12/18  NP cough?  Uri  Augmentin 875mg  Twice daily  For 1 week , take w/ food .  Mucinex DM Twice daily  As needed  Cough/congestion .  Tessalon Three times a day  As needed  Cough .  Begin Prednisone 20mg  daily , take with food.  Chest xray  ok      08/26/2018  f/u ov/Tariah Transue re: flare of cough related to uri, much better  Chief Complaint  Patient presents with   Follow-up    Cough is much improved. He very rarely uses his albuterol inhaler.   Dyspnea:  Pool x 2.5 h Cough: gone/ still has urge to clear to clear throat with sense of throat and chest "congestion" but no mucus Sleeping: ok / adjustable x 8 in hob rec  Prednisone  10 mg take a half x 3 days and stop  Whenever coughing , omeprazole 40 mg Take 30-60 min before first meal of the day and take pepcid 20 mg after supper (if not improving then omeprazole should be:  Take 30-60 min before first meal of the day ) For drainage / throat tickle try take CHLORPHENIRAMINE  4 mg - take one every 4 hours as needed - available over the counter- may cause drowsiness so start with just a bedtime dose or two and see how you tolerate it before trying in daytime    09/20/2018  f/u ov/Kattia Selley re: uacs flare early dec 2019 - no longer on gabapentin and only using 20 mg ppi/ 10 mg pepcid  Chief Complaint  Patient presents with   Acute Visit    Cough flared back up approx 2 wks ago, but has been getting better. He is not producing any sputum.   Dyspnea:  Water aerobics tol fine   Cough: esp in am but dry Sleeping: elevate hob only/ on cpap   Some sense of pnds not responding to zyrtec  rec Whenever coughing , omeprazole 40 mg (20 x 2) Take 30-60 min before first meal of the day and take pepcid 20 mg (10 x 2)  after supper - then when better ok to resume previous routine  For drainage / throat tickle try take CHLORPHENIRAMINE  4 mg -[facility]lortabs at [facility])  take one every 4 hours   11/24/2019  Acute extended  ov/Wilhemina Grall re: did fine for over  a year omeprazole 20mg  ac  only then 2 weeks prior to OV on arrival from [place] Onset 6 pm cough s assoc obvious sniffles or obvious pnds but more severe cough then covid shot 1 week prior to OV  And worse with more severe cough and sob and throat burning and mucus turing a little yellow  Chief Complaint  Patient presents with   Acute Visit    Pt c/o cough and "chest burning" for the past wk. He also c/o increased SOB. Cough is occ prod with clear to yellow sputum.    Dyspnea:  With activity whereas previously not limited including hills  Cough: esp after supper slt more productive  Sleeping: on cpap / ? Worse noct hb > just started pepcid hs   SABA use: none  02: none  rec Prednisone 10 mg take  4 each am x 2 days,   2 each am x 2 days,  1 each am x 2 days and stop  Zpak   Whenever coughing , omeprazole 40 mg (20mg   x 2) Take 30-60 min before first meal of the day and take pepcid 20 mg (10mg  x 2)  after supper - then when all  better ok to resume previous routine  For drainage / throat tickle try take CHLORPHENIRAMINE  4 mg  For cough > tessilon 200 mg up to every 6 hours as needed  GERD  Diet     08/30/20  Cough x 3 days  televisit  rec Prednisone 10 mg x 2 daily until better then one daily until seen in 2 weeks with cxr on return zpak and increased to 40 mg daily and back to 10 mg daily 09/26/2020     09/26/2020  Acute  ov/Khalea Ventura re: recurrent cough ? boop vs uacs, much better p pred/zpack Chief Complaint  Patient presents with   Cough    Reports symptoms improved since completing z-pack rx      Dyspnea:  More doe with wt gain / stopped ex/ not checking sats  Cough: dry tickle sensation  now sporadic, daytime, minimal use of delsym  Sleeping: no cough or sob/ on cpap and electric bed  SABA use: none 02: none  rec Stay on prednisone until better then taper as usual to 2.5 mg daily  For cough > mucinex dm 1200 twice daily supplment with Tramadol 50 mg 1-2 every 4 hours  As needed  Take zpak  Add   prilosec 40 mg before supper until cough gone then resume the am dose only  Refilled the prednisone  Check home covid test and let me know if positive Call on 12/06/20 if not improving - to ER if worse or 02 sats go less than 90% at rest     01/15/2021  f/u ov/Franceska Strahm re: BOOP back to 5 mg daily worse x several weeks  Chief Complaint  Patient presents with   Follow-up    SOB with exertion. On 5 mg or prednisone, feels like Sob is getting worse since decreased dose. Dry cough worse in the afternoon and at night.    Dyspnea:    MMRC3 = can't walk 100 yards even at a slow pace at a flat grade s stopping due to sob   Cough: dry / good at hs p 10 min of cough Sleeping: finally does sleep ok flat (no bed blocks_  SABA use: none  02: none Covid status:   vax x 3  Rec 4th vaccine is at least 6  months after the 3rd Dulera 100  (or advair)  Take 2 puffs first thing in am and then another 2 puffs about 12 hours later.  Work on inhaler technique:  Prednisone 10 mg 2 each am until better then  1 daily x one daily x one week and still better try 10 mg alternating with 5 mg daily to see if can wean to the lowest effective dose Bed blocks x 6-8 in Bone density at next check up visit with PCP >  Please schedule a follow up visit in 3 months but call sooner if needed    05/22/2021  f/u ov/Jatavius Ellenwood re: BOOP   maint on 5 mg daily   Chief Complaint  Patient presents with   Follow-up    Pt states no concerns   Dyspnea:  running in indoor pool  Cough: much better - still throat clearing  Sleeping: still on bed blocks  SABA use: none  02: none  Covid status:   vax x 4   No obvious day to day or daytime variability or assoc excess/ purulent sputum or mucus plugs or hemoptysis or cp or chest tightness, subjective wheeze or overt sinus or hb symptoms.   Sleeping  without nocturnal  or early am exacerbation  of respiratory  c/o's or need for noct saba. Also denies any obvious fluctuation of symptoms with weather  or environmental changes or other aggravating or alleviating factors except as outlined above   No unusual exposure hx or h/o childhood pna/ asthma or knowledge of premature birth.  Current Allergies, Complete Past Medical History, Past Surgical History, Family History, and Social History were reviewed in [employer] record.  ROS  The following are not active complaints unless bolded Hoarseness, sore throat, dysphagia, dental problems, itching, sneezing,  nasal congestion or discharge of excess mucus or purulent secretions, ear ache,   fever, chills, sweats, unintended wt loss or wt gain, classically pleuritic or exertional cp,  orthopnea pnd or arm/hand swelling  or leg swelling, presyncope, palpitations, abdominal pain, anorexia, nausea, vomiting, diarrhea  or change in bowel habits or change in bladder habits, change in stools or change in urine, dysuria, hematuria,  rash, arthralgias, visual complaints, headache, numbness, weakness or ataxia or problems with walking or coordination,  change in mood or  memory.        Current Meds  Medication Sig   allopurinol (ZYLOPRIM) 100 MG tablet Take 1 tablet (100 mg total) by mouth 2 (two) times daily.   aspirin 81 MG tablet Take 1 tablet (81 mg total) by mouth 2 (two) times daily after a meal.   benzonatate (TESSALON) 200 MG capsule TAKE 1 CAPSULE BY MOUTH EVERY 6 HOURS AS NEEDED   Bioflavonoid Products (VITAMIN C) CHEW Chew 1 tablet by mouth daily.   Blood Glucose Monitoring Suppl (FREESTYLE LITE) DEVI 1 each by Does not apply route 4 (four) times daily. E11.65   canagliflozin (INVOKANA) 100 MG TABS tablet Take 1 tablet (100 mg total) by mouth every other day.   Carboxymethylcellul-Glycerin (LUBRICATING EYE DROPS OP) Place 1 drop into both eyes daily as needed (dry eyes).   Cholecalciferol (VITAMIN D3 PO) Take 1 capsule by mouth daily.   CINNAMON PO Take 1,000 mg by mouth 2 (two) times daily.   colchicine 0.6 MG tablet TAKE 1  TABLET BY MOUTH TWICE A DAY WITH A MEAL AS NEEDED FOR ACUTE FLARE OF GOUT   Dextromethorphan-guaiFENesin (MUCINEX DM MAXIMUM STRENGTH) 60-1200 MG TB12 Take 1 tablet  by mouth 2 (two) times daily as needed (congestion).    ezetimibe (ZETIA) 10 MG tablet Take 1 tablet (10 mg total) by mouth daily.   famotidine (PEPCID) 20 MG tablet TAKE 1 TABLET BY MOUTH EVERYDAY AT BEDTIME   Glucosamine HCl (GLUCOSAMINE PO) Take 1,500 mg by mouth 2 (two) times daily.    glucose blood (FREESTYLE LITE) test strip 1 each by Other route 4 (four) times daily. E11.65   Lancets (FREESTYLE) lancets 1 EACH BY OTHER ROUTE 4 (FOUR) TIMES DAILY. E11.65   Lutein 20 MG CAPS Take 20 mg by mouth daily.    methocarbamol (ROBAXIN) 500 MG tablet Take 1 tablet (500 mg total) by mouth every 8 (eight) hours as needed for muscle spasms. Caution of sedation   metoprolol tartrate (LOPRESSOR) 25 MG tablet Take 0.5 tablets (12.5 mg total) by mouth daily.   Multiple Vitamins-Minerals (ZINC PO) Take 1 tablet by mouth daily.   omeprazole (PRILOSEC) 40 MG capsule TAKE 1 CAPUSLE BY MOUTH 30- 60 MIN BEFORE YOUR FIRST AND LAST MEALS OF THE DAY   ondansetron (ZOFRAN) 8 MG tablet Take 1 tablet (8 mg total) by mouth every 8 (eight) hours as needed for nausea or vomiting. From narcotic pain medicine   predniSONE (DELTASONE) 5 MG tablet Take 5 mg by mouth daily with breakfast.   Current Facility-Administered Medications for the 05/22/21 encounter (Office Visit) with Tanda Rockers, MD  Medication   betamethasone acetate-betamethasone sodium phosphate (CELESTONE) injection 3 mg                           Objective:   Physical Exam   05/22/2021        230   01/15/2021       231  09/26/2020        220  11/24/2019        206  09/20/2018        222  08/26/2018      222 07/29/2017      232  04/15/2017          243  02/10/2017          250  12/19/2016        250 11/11/2016          258  09/11/2016          252  07/29/2016      244  05/16/2016           238  02/01/2016        235  01/07/2016          228   12/19/15 229 lb 4 oz (103.987 kg)  12/19/15 229 lb 4 oz (103.987 kg)  12/19/15 227 lb (102.967 kg)    Vital signs reviewed  05/22/2021  - Note at rest 02 sats  98% on RA   General appearance:    amb slt hoarse wm occ thorat clearing/ slt cushingnoid   HEENT : pt wearing mask not removed for exam due to covid -19 concerns.    NECK :  without JVD/Nodes/TM/ nl carotid upstrokes bilaterally   LUNGS: no acc muscle use,  Nl contour chest which is clear to A and P bilaterally without cough on insp or exp maneuvers   CV:  RRR  no s3 or murmur or increase in P2, and no edema   ABD:  soft and nontender with nl inspiratory excursion in the supine position. No bruits or organomegaly  appreciated, bowel sounds nl  MS:  Nl gait/ ext warm without deformities, calf tenderness, cyanosis or clubbing No obvious joint restrictions   SKIN: warm and dry without lesions    NEURO:  alert, approp, nl sensorium with  no motor or cerebellar deficits apparent.        CXR PA and Lateral:   05/22/2021 :    I personally reviewed images  /impression as follows:    Lungs are clear       Assessment & Plan:

## 2021-05-22 NOTE — Assessment & Plan Note (Signed)
Recurrent pattern since feb 2017 Trial of gabapentin 100 tid 07/29/2016 >  Still tessalon dep 09/11/2016 so increased to 300 tid and ppi bid ac > not improved 11/11/2016 > rec max gerd rx and sugarless candy > resolved 12/19/2016  And off gabapentin 04/2017  - recurred early dec 2019 while off gabapentin and low dose ppi only  - 09/20/2018 rec max gerd rx and 1st gen H1 blockers per guidelines  And if not better start back on gabapentin  - 11/24/2019 recurred around November 07 2019 on return from car trip to [place] > rx pred/zpak/max gerd rx  > much better prior to 11/27/20 when caught wife's "cold"   rec continue max gerd rx/ use hard rock candy to avoid throat clearing          Each maintenance medication was reviewed in detail including emphasizing most importantly the difference between maintenance and prns and under what circumstances the prns are to be triggered using an action plan format where appropriate.  Total time for H and P, chart review, counseling,   and generating customized AVS unique to this office visit / same day charting  = 25 min

## 2021-05-22 NOTE — Assessment & Plan Note (Addendum)
Onset of symptoms feb 2017 with "photo neg pumonary edema pattern" Baseline PFTs 03/18/10  FVC  3.59 (80%) with ERV 67% and dlco 75 corrects to 106 - Allergy profile 12/19/15 >  Eos 0.2 /  IgE  89/ Pos Cat RAST only  12/19/2015  ESR 93 > started on prednisone 20 mg daily  HSP profile 12/19/15 >  Neg  - 01/07/2016 ESR down to 18 but only 30% improved symptomatically > rec continue 20 mg daily until better then 10 mg daily  - 02/01/2016 rec taper off x 6 weeks then ov > 05/16/2016 ESR = 58> restarted pred 06/03/16  - Collagen Vasc dz screen 07/29/2016  Neg  - PFT's  11/11/2016  FEV1 1.91 (56 % ) ratio 72  p 7 % improvement from saba p nothing prior to study with DLCO  42/43 % corrects to 77 % for alv volume but ERV 35%  - ESR  20 on 10 a/w 15 as  Of 11/11/2016 > rec try 10 mg daily  - Prednisone 5 mg daily as of 12/15/16 > taper to 5 mg qod by 02/10/2017 - 02/10/2017 ESR =  28  rec taper off x 2 weeks > no flare - 04/20/2017 ESR = 32 off pred since 02/2017  - 08/12/18  ESR = 16 with onset of cough  -  Cough recurred similar to Boop late November 2021  >  08/30/2020   Prednisone 10 mg x 2 daily until better then one daily until seen > cxr 09/26/2020 on 10 mg daily = wnl so rec taper off over 10 days  and see if cough flares  - 01/15/2021 flared again with taper to 5 mg so new floor of 10/5 and ceiling of 20 mg - 01/15/2021 rec bone density per PCP > osteopenia only  Adequate control on present rx, reviewed in detail with pt > no change in rx needed   The goal with a chronic steroid dependent illness is always arriving at the lowest effective dose that controls the disease/symptoms and not accepting a set "formula" which is based on statistics or guidelines that don't always take into account patient  variability or the natural hx of the dz in every individual patient, which may well vary over time.  For now therefore I recommend the patient maintain  5 mg floor for now and track sats at peak ex, double dose if declining     rec Make sure you check your oxygen saturation at your highest level of activity to be sure it stays over 90% and keep track of it at least once a week, more often if breathing getting worse, and let me know if losing ground.   F/u q 6 m

## 2021-05-22 NOTE — Telephone Encounter (Signed)
Called and spoke with patient. He stated that he and Dr. [name] had discussed the Tessalon perles this morning and he was under the impression that he had refills but he does not. He would like to have a refill sent into [facility] in [place].   I advised him that I would go ahead and send in the RX. He verbalized understanding.   Nothing further needed at time of call.

## 2021-05-27 ENCOUNTER — Other Ambulatory Visit: Payer: Self-pay

## 2021-05-27 ENCOUNTER — Ambulatory Visit (INDEPENDENT_AMBULATORY_CARE_PROVIDER_SITE_OTHER): Payer: Medicare Other | Admitting: Dermatology

## 2021-05-27 DIAGNOSIS — Z1283 Encounter for screening for malignant neoplasm of skin: Secondary | ICD-10-CM | POA: Diagnosis not present

## 2021-05-27 DIAGNOSIS — L820 Inflamed seborrheic keratosis: Secondary | ICD-10-CM

## 2021-05-27 DIAGNOSIS — L570 Actinic keratosis: Secondary | ICD-10-CM

## 2021-05-27 DIAGNOSIS — Z808 Family history of malignant neoplasm of other organs or systems: Secondary | ICD-10-CM

## 2021-05-27 DIAGNOSIS — L720 Epidermal cyst: Secondary | ICD-10-CM | POA: Diagnosis not present

## 2021-05-27 DIAGNOSIS — L578 Other skin changes due to chronic exposure to nonionizing radiation: Secondary | ICD-10-CM | POA: Diagnosis not present

## 2021-05-27 MED ORDER — FLUOROURACIL 5 % EX CREA
TOPICAL_CREAM | CUTANEOUS | 1 refills | Status: DC
Start: 2021-05-27 — End: 2021-09-17

## 2021-05-27 NOTE — Progress Notes (Signed)
Follow-Up Visit   Subjective  Kurt Aguilar is a 75 y.o. male who presents for the following: New Patient (Initial Visit) (Patient here today for full body exam. Patient reports some bumps on scalp, right side of neck, temples and ears he would like checked today. Patient reports some skin cancer in brother. State brother had cancer on nose and required surgery. /).  Patient here for full body skin exam and skin cancer screening.  The following portions of the chart were reviewed this encounter and updated as appropriate:  Tobacco  Allergies  Meds  Problems  Med Hx  Surg Hx  Fam Hx     Review of Systems: No other skin or systemic complaints except as noted in HPI or Assessment and Plan.  Objective  Well appearing patient in no apparent distress; mood and affect are within normal limits.  A full examination was performed including scalp, head, eyes, ears, nose, lips, neck, chest, axillae, abdomen, back, buttocks, bilateral upper extremities, bilateral lower extremities, hands, feet, fingers, toes, fingernails, and toenails. All findings within normal limits unless otherwise noted below.  Scalp x 17 (17) Erythematous thin papules/macules with gritty scale.   right neck Subcutaneous nodule.   Scalp x 4 (4) Erythematous keratotic or waxy stuck-on papule or plaque.   Assessment & Plan  Actinic keratosis Scalp x 17 Actinic keratoses are precancerous spots that appear secondary to cumulative UV radiation exposure/sun exposure over time. They are chronic with expected duration over 1 year. A portion of actinic keratoses will progress to squamous cell carcinoma of the skin. It is not possible to reliably predict which spots will progress to skin cancer and so treatment is recommended to prevent development of skin cancer.  Recommend daily broad spectrum sunscreen SPF 30+ to sun-exposed areas, reapply every 2 hours as needed.  Recommend staying in the shade or wearing long sleeves,  sun glasses (UVA+UVB protection) and wide brim hats (4-inch brim around the entire circumference of the hat). Call for new or changing lesions.  - Start 5-fluorouracil/calcipotriene cream twice a day for 7 days to affected areas including entire scalp. Prescription sent to [facility]. Patient advised they will receive an email to purchase the medication online and have it sent to their home. Patient provided with handout reviewing treatment course and side effects and advised to call or message [place] on MyChart with any concerns.  Actinic Damage - Severe, confluent actinic changes with pre-cancerous actinic keratoses  - Severe, chronic, not at goal, secondary to cumulative UV radiation exposure over time - diffuse scaly erythematous macules and papules with underlying dyspigmentation - Discussed Prescription "Field Treatment" for Severe, Chronic Confluent Actinic Changes with Pre-Cancerous Actinic Keratoses Field treatment involves treatment of an entire area of skin that has confluent Actinic Changes (Sun/ Ultraviolet light damage) and PreCancerous Actinic Keratoses by method of PhotoDynamic Therapy (PDT) and/or prescription Topical Chemotherapy agents such as 5-fluorouracil, 5-fluorouracil/calcipotriene, and/or imiquimod.  The purpose is to decrease the number of clinically evident and subclinical PreCancerous lesions to prevent progression to development of skin cancer by chemically destroying early precancer changes that may or may not be visible.  It has been shown to reduce the risk of developing skin cancer in the treated area. As a result of treatment, redness, scaling, crusting, and open sores may occur during treatment course. One or more than one of these methods may be used and may have to be used several times to control, suppress and eliminate the PreCancerous changes.  Discussed treatment course, expected reaction, and possible side effects. - Recommend daily broad spectrum  sunscreen SPF 30+ to sun-exposed areas, reapply every 2 hours as needed.  - Staying in the shade or wearing long sleeves, sun glasses (UVA+UVB protection) and wide brim hats (4-inch brim around the entire circumference of the hat) are also recommended. - Call for new or changing lesions.  fluorouracil (EFUDEX) 5 % cream - Scalp x 17 Apply topically See admin instructions. Apply to entire scalp for 7 days  Epidermoid cyst of skin right neck Benign-appearing. Exam most consistent with an epidermal inclusion cyst. Discussed that a cyst is a benign growth that can grow over time and sometimes get irritated or inflamed. Recommend observation if it is not bothersome. Discussed option of surgical excision to remove it if it is growing, symptomatic, or other changes noted. Please call for new or changing lesions so they can be evaluated.  Inflamed seborrheic keratosis Scalp x 4 Destruction of lesion - Scalp x 4 Complexity: simple   Destruction method: cryotherapy   Informed consent: discussed and consent obtained   Timeout:  patient name, date of birth, surgical site, and procedure verified Lesion destroyed using liquid nitrogen: Yes   Region frozen until ice ball extended beyond lesion: Yes   Outcome: patient tolerated procedure well with no complications   Post-procedure details: wound care instructions given    Skin cancer screening  Return for Jan or february follow up on aks . IRuthell Rummage, CMA, am acting as scribe for Sarina Ser, MD. Documentation: I have reviewed the above documentation for accuracy and completeness, and I agree with the above.  Sarina Ser, MD

## 2021-05-27 NOTE — Patient Instructions (Addendum)
5-Fluorouracil/Calcipotriene Patient Education  For area at scalp start in 4 weeks from today   Actinic keratoses are the dry, red scaly spots on the skin caused by sun damage. A portion of these spots can turn into skin cancer with time, and treating them can help prevent development of skin cancer.   Treatment of these spots requires removal of the defective skin cells. There are various ways to remove actinic keratoses, including freezing with liquid nitrogen, treatment with creams, or treatment with a blue light procedure in the office.   5-fluorouracil cream is a topical cream used to treat actinic keratoses. It works by interfering with the growth of abnormal fast-growing skin cells, such as actinic keratoses. These cells peel off and are replaced by healthy ones.   5-fluorouracil/calcipotriene is a combination of the 5-fluorouracil cream with a vitamin D analog cream called calcipotriene. The calcipotriene alone does not treat actinic keratoses. However, when it is combined with 5-fluorouracil, it helps the 5-fluorouracil treat the actinic keratoses much faster so that the same results can be achieved with a much shorter treatment time.  INSTRUCTIONS FOR 5-FLUOROURACIL/CALCIPOTRIENE CREAM:   5-fluorouracil/calcipotriene cream typically only needs to be used for 4-7 days. A thin layer should be applied twice a day to the treatment areas recommended by your physician.   If your physician prescribed you separate tubes of 5-fluourouracil and calcipotriene, apply a thin layer of 5-fluorouracil followed by a thin layer of calcipotriene.   Avoid contact with your eyes, nostrils, and mouth. Do not use 5-fluorouracil/calcipotriene cream on infected or open wounds.   You will develop redness, irritation and some crusting at areas where you have pre-cancer damage/actinic keratoses. IF YOU DEVELOP PAIN, BLEEDING, OR SIGNIFICANT CRUSTING, STOP THE TREATMENT EARLY - you have already gotten a good  response and the actinic keratoses should clear up well.  Wash your hands after applying 5-fluorouracil 5% cream on your skin.   A moisturizer or sunscreen with a minimum SPF 30 should be applied each morning.   Once you have finished the treatment, you can apply a thin layer of Vaseline twice a day to irritated areas to soothe and calm the areas more quickly. If you experience significant discomfort, contact your physician.  For some patients it is necessary to repeat the treatment for best results.  SIDE EFFECTS: When using 5-fluorouracil/calcipotriene cream, you may have mild irritation, such as redness, dryness, swelling, or a mild burning sensation. This usually resolves within 2 weeks. The more actinic keratoses you have, the more redness and inflammation you can expect during treatment. Eye irritation has been reported rarely. If this occurs, please let us know.  If you have any trouble using this cream, please call the office. If you have any other questions about this information, please do not hesitate to ask me before you leave the office.     Seborrheic Keratosis  What causes seborrheic keratoses? Seborrheic keratoses are harmless, common skin growths that first appear during adult life.  As time goes by, more growths appear.  Some people may develop a large number of them.  Seborrheic keratoses appear on both covered and uncovered body parts.  They are not caused by sunlight.  The tendency to develop seborrheic keratoses can be inherited.  They vary in color from skin-colored to gray, brown, or even black.  They can be either smooth or have a rough, warty surface.   Seborrheic keratoses are superficial and look as if they were stuck on the skin.  Under  the microscope this type of keratosis looks like layers upon layers of skin.  That is why at times the top layer may seem to fall off, but the rest of the growth remains and re-grows.    Treatment Seborrheic keratoses do not need to  be treated, but can easily be removed in the office.  Seborrheic keratoses often cause symptoms when they rub on clothing or jewelry.  Lesions can be in the way of shaving.  If they become inflamed, they can cause itching, soreness, or burning.  Removal of a seborrheic keratosis can be accomplished by freezing, burning, or surgery. If any spot bleeds, scabs, or grows rapidly, please return to have it checked, as these can be an indication of a skin cancer.  Actinic keratoses are precancerous spots that appear secondary to cumulative UV radiation exposure/sun exposure over time. They are chronic with expected duration over 1 year. A portion of actinic keratoses will progress to squamous cell carcinoma of the skin. It is not possible to reliably predict which spots will progress to skin cancer and so treatment is recommended to prevent development of skin cancer.  Recommend daily broad spectrum sunscreen SPF 30+ to sun-exposed areas, reapply every 2 hours as needed.  Recommend staying in the shade or wearing long sleeves, sun glasses (UVA+UVB protection) and wide brim hats (4-inch brim around the entire circumference of the hat). Call for new or changing lesions.   Cryotherapy Aftercare  Wash gently with soap and water everyday.   Apply Vaseline and Band-Aid daily until healed.    Melanoma ABCDEs  Melanoma is the most dangerous type of skin cancer, and is the leading cause of death from skin disease.  You are more likely to develop melanoma if you: Have light-colored skin, light-colored eyes, or red or blond hair Spend a lot of time in the sun Tan regularly, either outdoors or in a tanning bed Have had blistering sunburns, especially during childhood Have a close family member who has had a melanoma Have atypical moles or large birthmarks  Early detection of melanoma is key since treatment is typically straightforward and cure rates are extremely high if we catch it early.   The first sign  of melanoma is often a change in a mole or a new dark spot.  The ABCDE system is a way of remembering the signs of melanoma.  A for asymmetry:  The two halves do not match. B for border:  The edges of the growth are irregular. C for color:  A mixture of colors are present instead of an even brown color. D for diameter:  Melanomas are usually (but not always) greater than 57mm - the size of a pencil eraser. E for evolution:  The spot keeps changing in size, shape, and color.  Please check your skin once per month between visits. You can use a small mirror in front and a large mirror behind you to keep an eye on the back side or your body.   If you see any new or changing lesions before your next follow-up, please call to schedule a visit.  Please continue daily skin protection including broad spectrum sunscreen SPF 30+ to sun-exposed areas, reapplying every 2 hours as needed when you're outdoors.   Staying in the shade or wearing long sleeves, sun glasses (UVA+UVB protection) and wide brim hats (4-inch brim around the entire circumference of the hat) are also recommended for sun protection.    If you have any questions or concerns for  your doctor, please call our main line at [phone] and press option 4 to reach your doctor's medical assistant. If no one answers, please leave a voicemail as directed and we will return your call as soon as possible. Messages left after 4 pm will be answered the following business day.   You may also send [place] a message via [place]. We typically respond to MyChart messages within 1-2 business days.  For prescription refills, please ask your pharmacy to contact our office. Our fax number is [phone].  If you have an urgent issue when the clinic is closed that cannot wait until the next business day, you can page your doctor at the number below.    Please note that while we do our best to be available for urgent issues outside of office hours, we are not  available 24/7.   If you have an urgent issue and are unable to reach [place], you may choose to seek medical care at your doctor's office, retail clinic, urgent care center, or emergency room.  If you have a medical emergency, please immediately call 911 or go to the emergency department.  Pager Numbers  - Dr. Nehemiah Massed: [phone]  - Dr. Laurence Ferrari: [phone]  - Dr. Nicole Kindred: [phone]  In the event of inclement weather, please call our main line at [phone] for an update on the status of any delays or closures.  Dermatology Medication Tips: Please keep the boxes that topical medications come in in order to help keep track of the instructions about where and how to use these. Pharmacies typically print the medication instructions only on the boxes and not directly on the medication tubes.   If your medication is too expensive, please contact our office at [phone] option 4 or send [place] a message through [place].   We are unable to tell what your co-pay for medications will be in advance as this is different depending on your insurance coverage. However, we may be able to find a substitute medication at lower cost or fill out paperwork to get insurance to cover a needed medication.   If a prior authorization is required to get your medication covered by your insurance company, please allow [place] 1-2 business days to complete this process.  Drug prices often vary depending on where the prescription is filled and some pharmacies may offer cheaper prices.  The website www.goodrx.com contains coupons for medications through different pharmacies. The prices here do not account for what the cost may be with help from insurance (it may be cheaper with your insurance), but the website can give you the price if you did not use any insurance.  - You can print the associated coupon and take it with your prescription to the pharmacy.  - You may also stop by our office during regular business hours  and pick up a GoodRx coupon card.  - If you need your prescription sent electronically to a different pharmacy, notify our office through [facility] or by phone at [phone] option 4.

## 2021-05-28 ENCOUNTER — Encounter: Payer: Self-pay | Admitting: Dermatology

## 2021-06-04 ENCOUNTER — Other Ambulatory Visit: Payer: Self-pay | Admitting: Endocrinology

## 2021-06-04 DIAGNOSIS — E1165 Type 2 diabetes mellitus with hyperglycemia: Secondary | ICD-10-CM

## 2021-06-07 DIAGNOSIS — G4733 Obstructive sleep apnea (adult) (pediatric): Secondary | ICD-10-CM | POA: Diagnosis not present

## 2021-06-14 NOTE — Progress Notes (Signed)
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 A lot of short nights in past month post-op discomfort.  Machine is old- discussed replacement with autopap.  BOOP cough doing very well, off prednisone.  06/17/21- 75 year old male former smoker followed for OSA, bronchitis,  BOOP (   infiltrates w high ESR- Dr [name]), complicated by DM, CAD/CABG, Obesity,  CPAP 10-20 /[employer] Download-compliance 90%, AHI 4.7/ hr Body weight today-236 lbs Covid vax-4 Moderna + recent updated booster = 5 Flu vax-had -----Cpap alright other than keeping face mask on  Gaining weight on prednisone for BOOP and will f/u w Dr [name]. Likes his FFM. Download reviewed.  Sleeping okay despite prednisone.  Once he wakes up he begins coughing through the day and continues working with Dr. [name].  ROS-see HPI + = positive Constitutional:   No-   weight loss, night sweats, fevers, chills, fatigue, lassitude. HEENT:   No-  headaches, difficulty swallowing, tooth/dental problems, sore throat,       No-  sneezing, itching, ear ache, nasal congestion, post nasal drip,  CV:  No-   chest pain, orthopnea, PND, swelling in lower extremities, anasarca, dizziness, palpitations Resp: No-   shortness of breath with exertion or at rest.               productive cough,  + non-productive cough,  No- coughing up of blood.              No-   change in color of mucus.  No- wheezing.   Skin: No-   rash or lesions. GI:  No-   heartburn, indigestion, abdominal pain, nausea, vomiting,  GU:  MS:  + joint  pain or swelling. . Neuro-     nothing unusual Psych:  No- change in mood or affect. No depression or anxiety.  No memory loss.  OBJ General- Alert, Oriented, Affect-appropriate, Distress- none acute, + obese Skin- rash-none, lesions- none, excoriation- none Lymphadenopathy- none Head- atraumatic            Eyes- Gross vision intact, PERRLA, conjunctivae clear secretions            Ears- Hearing aid+            Nose- Clear, no-Septal dev, mucus, polyps, erosion, perforation             Throat- Mallampati II , mucosa clear , drainage- none, tonsils- atrophic Neck- flexible , trachea midline, no stridor , thyroid nl, carotid no bruit Chest - symmetrical excursion , unlabored           Heart/CV- RRR , no murmur , no gallop  , no rub, nl s1 s2                           - JVD- none , edema- none, stasis changes- none, varices- none           Lung- clear to P&A, wheeze- none, cough+slight dry , dullness-none, rub- none           Chest wall-  Abd- Br/ Gen/ Rectal- Not done, not indicated Extrem- +  L TKR  incision scar Neuro- grossly intact to observation

## 2021-06-17 ENCOUNTER — Ambulatory Visit: Payer: Medicare Other | Admitting: Internal Medicine

## 2021-06-17 ENCOUNTER — Encounter: Payer: Self-pay | Admitting: Internal Medicine

## 2021-06-17 ENCOUNTER — Other Ambulatory Visit: Payer: Self-pay

## 2021-06-17 DIAGNOSIS — G4733 Obstructive sleep apnea (adult) (pediatric): Secondary | ICD-10-CM | POA: Diagnosis not present

## 2021-06-17 DIAGNOSIS — R918 Other nonspecific abnormal finding of lung field: Secondary | ICD-10-CM | POA: Diagnosis not present

## 2021-06-17 NOTE — Patient Instructions (Signed)
We can continue CPAP auto 10-20  Please call if we can help 

## 2021-07-03 DIAGNOSIS — Q612 Polycystic kidney, adult type: Secondary | ICD-10-CM | POA: Diagnosis not present

## 2021-07-03 DIAGNOSIS — E1129 Type 2 diabetes mellitus with other diabetic kidney complication: Secondary | ICD-10-CM | POA: Diagnosis not present

## 2021-07-03 DIAGNOSIS — I10 Essential (primary) hypertension: Secondary | ICD-10-CM | POA: Diagnosis not present

## 2021-07-03 DIAGNOSIS — N1832 Chronic kidney disease, stage 3b: Secondary | ICD-10-CM | POA: Diagnosis not present

## 2021-07-04 ENCOUNTER — Other Ambulatory Visit: Payer: Self-pay | Admitting: Internal Medicine

## 2021-07-30 DIAGNOSIS — H04129 Dry eye syndrome of unspecified lacrimal gland: Secondary | ICD-10-CM | POA: Diagnosis not present

## 2021-07-30 DIAGNOSIS — H02889 Meibomian gland dysfunction of unspecified eye, unspecified eyelid: Secondary | ICD-10-CM | POA: Diagnosis not present

## 2021-07-30 DIAGNOSIS — E119 Type 2 diabetes mellitus without complications: Secondary | ICD-10-CM | POA: Diagnosis not present

## 2021-07-30 DIAGNOSIS — H353131 Nonexudative age-related macular degeneration, bilateral, early dry stage: Secondary | ICD-10-CM | POA: Diagnosis not present

## 2021-08-11 ENCOUNTER — Encounter: Payer: Self-pay | Admitting: Emergency Medicine

## 2021-08-11 ENCOUNTER — Other Ambulatory Visit: Payer: Self-pay

## 2021-08-11 ENCOUNTER — Ambulatory Visit
Admission: EM | Admit: 2021-08-11 | Discharge: 2021-08-11 | Disposition: A | Discharge status: Home / Self Care | Payer: Medicare Other | Attending: Emergency Medicine | Admitting: Emergency Medicine

## 2021-08-11 DIAGNOSIS — B349 Viral infection, unspecified: Secondary | ICD-10-CM

## 2021-08-11 DIAGNOSIS — Z20822 Contact with and (suspected) exposure to covid-19: Secondary | ICD-10-CM

## 2021-08-11 MED ORDER — MOLNUPIRAVIR EUA 200MG CAPSULE: 4.0000 | ORAL_CAPSULE | Freq: Two times a day (BID) | ORAL | 0 refills | Status: AC

## 2021-08-11 NOTE — Discharge Instructions (Addendum)
Take the molnupiravir as directed.    Your COVID and Flu tests are pending.  You should self quarantine until the test results are back.    Take Tylenol as needed for fever or discomfort.  Rest and keep yourself hydrated.    Follow-up with your primary care provider if your symptoms are not improving.    Go to the emergency department right away if you have shortness of breath or other concerning symptoms.

## 2021-08-11 NOTE — ED Provider Notes (Signed)
Kurt Aguilar    CSN: [id number] Arrival date & time: 08/11/21  1211      History   Chief Complaint Chief Complaint  Patient presents with   Generalized Body Aches   Sore Throat   Chest Congestion    HPI Kurt Aguilar is a 75 y.o. male.  Patient presents with sore throat, congestion, cough x1 day.  His wife tested positive for COVID at home this morning but his test was negative.  He denies fever, rash, shortness of breath, vomiting, diarrhea, or other symptoms.  Treatment at home with Tylenol.  His medical history includes hypertension, diabetes, renal insufficiency, pulmonary infiltrates with BOOP.    The history is provided by the patient, the spouse and medical records.   Past Medical History:  Diagnosis Date   Arthritis    Chronic kidney disease    Complication of anesthesia    constipation and inability to  urinate happened a few days after cataract surgery    Diabetes ([place])    type 2    Diverticulitis    GERD (gastroesophageal reflux disease)    Gout    Heart attack ([place])    Heart disease    Heart murmur    Hyperglycemia    Hyperlipidemia    Hypertension    Osteoporosis    Pneumonia    hx of BOOP followed by Dr [name]    Sleep apnea    cpapp - setting at 17     Patient Active Problem List   Diagnosis Date Noted   Osteopenia 05/16/2021   Steroid dependent ([place]) 04/12/2021   Primary osteoarthritis of left knee 05/29/2020   Colon cancer screening 02/13/2017   Diabetes mellitus type 2, uncontrolled 08/13/2016   Chronic heel pain, left 08/12/2016   Upper airway cough syndrome 07/31/2016   Class 1 obesity with serious comorbidity and body mass index (BMI) of 32.0 to 32.9 in adult 05/19/2016   Routine general medical examination at a health care facility 01/27/2016   Pulmonary infiltrates with high  ESR c/w BOOP/ idiopathic  12/19/2015   Fatigue 12/12/2015   Cough 11/29/2015   Cystic kidney disease 10/01/2015   Renal lesion 09/27/2015    Erectile dysfunction of organic origin 09/27/2015   BPH with obstruction/lower urinary tract symptoms 09/27/2015   Constipation 09/24/2015   Cyst of right kidney 09/24/2015   CAD (coronary artery disease) 06/08/2015   Grieving 11/22/2014   Encounter for Medicare annual wellness exam 07/11/2013   Prostate cancer screening 07/03/2013   Right bundle branch block 02/22/2013   Chronic low back pain 05/13/2011   Obstructive sleep apnea 03/15/2010   Hyperlipidemia associated with type 2 diabetes mellitus ([place]) 04/12/2009   Gout 04/12/2009   Essential hypertension 04/12/2009   Coronary artery disease of bypass graft of native heart with stable angina pectoris ([place]) 04/12/2009   GERD 04/12/2009   Renal insufficiency 04/12/2009   DIVERTICULITIS, HX OF 04/12/2009    Past Surgical History:  Procedure Laterality Date   ACROMIO-CLAVICULAR JOINT REPAIR Left 05/05/2019   Procedure: SHOULDER ARTHROSCOPIC ASSISTED ACROMIO-CLAVICULAR JOINT REPAIR;  Surgeon: Tania Ade, MD;  Location: [facility];  Service: Orthopedics;  Laterality: Left;   CATARACT EXTRACTION Bilateral    CORONARY ARTERY BYPASS GRAFT     [place]   open heart surgery  09/09/1995-1998   5 bypass   REPAIR KNEE LIGAMENT     right   SHOULDER ARTHROSCOPY Left 05/05/2019   Procedure: ARTHROSCOPY SHOULDER;  Surgeon: Tania Ade, MD;  Location:  [facility];  Service: Orthopedics;  Laterality: Left;   TONSILLECTOMY     TOTAL KNEE ARTHROPLASTY Left 05/29/2020   Procedure: LEFT TOTAL KNEE ARTHROPLASTY;  Surgeon: Melrose Nakayama, MD;  Location: [facility];  Service: Orthopedics;  Laterality: Left;       Home Medications    Prior to Admission medications   Medication Sig Start Date End Date Taking? Authorizing Provider  molnupiravir EUA (LAGEVRIO) 200 mg CAPS capsule Take 4 capsules (800 mg total) by mouth 2 (two) times daily for 5 days. 08/11/21 08/16/21 Yes Sharion Balloon, NP  allopurinol (ZYLOPRIM) 100 MG tablet Take 1 tablet (100 mg  total) by mouth 2 (two) times daily. Patient taking differently: Take 100 mg by mouth 2 (two) times daily. Patient states taking 4m daily 04/12/21   Tower, MWynelle Fanny MD  aspirin 81 MG tablet Take 1 tablet (81 mg total) by mouth 2 (two) times daily after a meal. 05/29/20   NLoni Dolly PA-C  benzonatate (TESSALON) 200 MG capsule TAKE 1 CAPSULE BY MOUTH EVERY 6 HOURS AS NEEDED 05/22/21   WTanda Rockers MD  Bioflavonoid Products (VITAMIN C) CHEW Chew 1 tablet by mouth daily.    [provider]  Blood Glucose Monitoring Suppl (FREESTYLE LITE) DEVI 1 each by Does not apply route 4 (four) times daily. E11.65 01/16/20   ERenato Shin MD  canagliflozin (INVOKANA) 100 MG TABS tablet Take 1 tablet (100 mg total) by mouth every other day. 03/08/21   ERenato Shin MD  Carboxymethylcellul-Glycerin (LUBRICATING EYE DROPS OP) Place 1 drop into both eyes daily as needed (dry eyes).    [provider]  Cholecalciferol (VITAMIN D3 PO) Take 1 capsule by mouth daily.    [provider]  CINNAMON PO Take 1,000 mg by mouth 2 (two) times daily.    [provider]  colchicine 0.6 MG tablet TAKE 1 TABLET BY MOUTH TWICE A DAY WITH A MEAL AS NEEDED FOR ACUTE FLARE OF GOUT 03/16/18   Tower, MWynelle Fanny MD  Dextromethorphan-guaiFENesin (MUCINEX DM MAXIMUM STRENGTH) 60-1200 MG TB12 Take 1 tablet by mouth 2 (two) times daily as needed (congestion).     [provider]  ezetimibe (ZETIA) 10 MG tablet Take 1 tablet (10 mg total) by mouth daily. 04/12/21   Tower, MWynelle Fanny MD  famotidine (PEPCID) 20 MG tablet TAKE 1 TABLET BY MOUTH EVERYDAY AT BEDTIME 03/19/21   WTanda Rockers MD  fluorouracil (EFUDEX) 5 % cream Apply topically See admin instructions. Apply to entire scalp for 7 days Patient not taking: Reported on 06/17/2021 05/27/21   KRalene Bathe MD  Glucosamine HCl (GLUCOSAMINE PO) Take 1,500 mg by mouth 2 (two) times daily.     [provider]  glucose blood (FREESTYLE LITE)  test strip 1 each by Other route 4 (four) times daily. E11.65 04/10/20   ERenato Shin MD  Lancets (FREESTYLE) lancets 1 EACH BY OTHER ROUTE 4 (FOUR) TIMES DAILY. E11.65 08/23/20   ERenato Shin MD  Lutein 20 MG CAPS Take 20 mg by mouth daily.     [provider]  methocarbamol (ROBAXIN) 500 MG tablet Take 1 tablet (500 mg total) by mouth every 8 (eight) hours as needed for muscle spasms. Caution of sedation 05/29/20   NLoni Dolly PA-C  metoprolol tartrate (LOPRESSOR) 25 MG tablet Take 0.5 tablets (12.5 mg total) by mouth daily. 04/17/20   GMinna Merritts MD  Multiple Vitamins-Minerals (ZINC PO) Take 1 tablet by mouth daily.  [provider]  omeprazole (PRILOSEC) 40 MG capsule TAKE 1 CAPUSLE BY MOUTH 30- 60 MIN BEFORE YOUR FIRST AND LAST MEALS OF THE DAY 02/25/21   Tanda Rockers, MD  ondansetron (ZOFRAN) 8 MG tablet Take 1 tablet (8 mg total) by mouth every 8 (eight) hours as needed for nausea or vomiting. From narcotic pain medicine Patient not taking: Reported on 06/17/2021 01/03/20   Tower, [name], MD  predniSONE (DELTASONE) 5 MG tablet Take 5 mg by mouth daily with breakfast.    [provider]    Family History Family History  Problem Relation Age of Onset   Heart disease Mother    Heart disease Father    Stroke Father    Heart attack Son 99   Kidney disease Neg Hx    Prostate cancer Neg Hx    Diabetes Neg Hx     Social History Social History   Tobacco Use   Smoking status: Former    Packs/day: 1.00    Years: 3.00    Pack years: 3.00    Types: Cigars, Cigarettes    Quit date: 11/25/2004    Years since quitting: 16.7   Smokeless tobacco: Never   Tobacco comments:    occas. cigar quit 2006  Vaping Use   Vaping Use: Never used  Substance Use Topics   Alcohol use: Yes    Alcohol/week: 1.0 standard drink    Types: 1 Standard drinks or equivalent per week    Comment: rare   Drug use: No     Allergies    Oxycodone-acetaminophen   Review of Systems Review of Systems  Constitutional:  Negative for chills and fever.  HENT:  Positive for congestion and sore throat. Negative for ear pain.   Respiratory:  Positive for cough. Negative for shortness of breath.   Cardiovascular:  Negative for chest pain and palpitations.  Gastrointestinal:  Negative for diarrhea and vomiting.  Skin:  Negative for color change and rash.  All other systems reviewed and are negative.   Physical Exam Triage Vital Signs ED Triage Vitals  Enc Vitals Group     BP      Pulse      Resp      Temp      Temp src      SpO2      Weight      Height      Head Circumference      Peak Flow      Pain Score      Pain Loc      Pain Edu?      Excl. in [place]?    No data found.  Updated Vital Signs BP (!) 150/86   Pulse 81   Temp 98 F (36.7 C)   Resp 20   SpO2 94%   Visual Acuity Right Eye Distance:   Left Eye Distance:   Bilateral Distance:    Right Eye Near:   Left Eye Near:    Bilateral Near:     Physical Exam Vitals and nursing note reviewed.  Constitutional:      General: He is not in acute distress.    Appearance: He is well-developed.  HENT:     Right Ear: Tympanic membrane normal.     Left Ear: Tympanic membrane normal.     Nose: Nose normal.     Mouth/Throat:     Mouth: Mucous membranes are moist.     Pharynx: Oropharynx is clear.  Cardiovascular:  Rate and Rhythm: Normal rate and regular rhythm.     Heart sounds: No murmur heard. Pulmonary:     Effort: Pulmonary effort is normal. No respiratory distress.     Breath sounds: Normal breath sounds.  Abdominal:     Palpations: Abdomen is soft.     Tenderness: There is no abdominal tenderness.  Musculoskeletal:     Cervical back: Neck supple.  Skin:    General: Skin is warm and dry.  Neurological:     Mental Status: He is alert.  Psychiatric:        Mood and Affect: Mood normal.        Behavior: Behavior normal.     UC  Treatments / Results  Labs (all labs ordered are listed, but only abnormal results are displayed) Labs Reviewed  COVID-19, FLU A+B NAA    EKG   Radiology No results found.  Procedures Procedures (including critical care time)  Medications Ordered in UC Medications - No data to display  Initial Impression / Assessment and Plan / UC Course  I have reviewed the triage vital signs and the nursing notes.  Pertinent labs & imaging results that were available during my care of the patient were reviewed by me and considered in my medical decision making (see chart for details).   Viral illness, Exposure to COVID.  COVID and flu pending.  Instructed patient to self quarantine per CDC guidelines.  Treating with molnupiravir.  Discussed symptomatic treatment including Tylenol, rest, hydration.  Instructed patient to follow up with PCP if symptoms are not improving.  Strict ED precautions discussed and patient instructed to monitor his O2 sat at home.  Patient agrees to plan of care.    Final Clinical Impressions(s) / UC Diagnoses   Final diagnoses:  Viral illness  Exposure to COVID-19 virus     Discharge Instructions      Take the molnupiravir as directed.    Your COVID and Flu tests are pending.  You should self quarantine until the test results are back.    Take Tylenol as needed for fever or discomfort.  Rest and keep yourself hydrated.    Follow-up with your primary care provider if your symptoms are not improving.    Go to the emergency department right away if you have shortness of breath or other concerning symptoms.         ED Prescriptions     Medication Sig Dispense Auth. Provider   molnupiravir EUA (LAGEVRIO) 200 mg CAPS capsule Take 4 capsules (800 mg total) by mouth 2 (two) times daily for 5 days. 40 capsule Sharion Balloon, NP      PDMP not reviewed this encounter.   Sharion Balloon, NP 08/11/21 1511

## 2021-08-11 NOTE — ED Triage Notes (Addendum)
Pt here with sore throat, chest congestion since yesterday. Wife tested positive for COVID on a home test. Pt requesting a Z-pack.

## 2021-08-12 LAB — COVID-19, FLU A+B NAA
Influenza A, NAA: NOT DETECTED
Influenza B, NAA: NOT DETECTED
SARS-CoV-2, NAA: DETECTED — AB

## 2021-08-20 ENCOUNTER — Other Ambulatory Visit: Payer: Self-pay | Admitting: Internal Medicine

## 2021-08-20 ENCOUNTER — Telehealth: Payer: Self-pay | Admitting: Internal Medicine

## 2021-08-20 MED ORDER — AZITHROMYCIN 250 MG PO TABS: ORAL_TABLET | ORAL | 0 refills | Status: DC

## 2021-08-20 NOTE — Telephone Encounter (Signed)
Zpak and call if not better by 12/9 am

## 2021-08-20 NOTE — Telephone Encounter (Signed)
I called the patient and verified DOB and he reports that he is having some yellow sputum that is thick and has been having some nasal congestion for 1 week.   He reports that he had Covid 08/10/21 and is feeling better from it but he thinks his boop has come back as he is aware this has happened in the past when he gets sick. He is requesting an ABT. Please advise.

## 2021-08-20 NOTE — Telephone Encounter (Signed)
Spoke with pt and reviewed Dr. Gustavus Bryant recommendations including medication education. Pt stated understanding. Pt confirmed pharmacy as [facility] [address] in [place]. Nothing further needed at this time.

## 2021-08-26 ENCOUNTER — Encounter: Payer: Self-pay | Admitting: Internal Medicine

## 2021-08-26 ENCOUNTER — Other Ambulatory Visit: Payer: Self-pay

## 2021-08-26 ENCOUNTER — Ambulatory Visit: Payer: Medicare Other | Admitting: Internal Medicine

## 2021-08-26 ENCOUNTER — Ambulatory Visit: Payer: Medicare Other | Admitting: Primary Care

## 2021-08-26 ENCOUNTER — Telehealth: Payer: Self-pay | Admitting: Internal Medicine

## 2021-08-26 ENCOUNTER — Ambulatory Visit (INDEPENDENT_AMBULATORY_CARE_PROVIDER_SITE_OTHER): Payer: Medicare Other

## 2021-08-26 VITALS — BP 134/80 | HR 73 | Temp 97.7°F | Ht 70.0 in | Wt 235.4 lb

## 2021-08-26 DIAGNOSIS — R0609 Other forms of dyspnea: Secondary | ICD-10-CM | POA: Diagnosis not present

## 2021-08-26 DIAGNOSIS — U071 COVID-19: Secondary | ICD-10-CM | POA: Diagnosis not present

## 2021-08-26 DIAGNOSIS — R918 Other nonspecific abnormal finding of lung field: Secondary | ICD-10-CM | POA: Diagnosis not present

## 2021-08-26 LAB — D-DIMER, QUANTITATIVE: D-Dimer, Quant: 0.19 mcg/mL FEU (ref <0.50)

## 2021-08-26 LAB — COMPREHENSIVE METABOLIC PANEL
ALT: 30 U/L (ref 0–53)
AST: 20 U/L (ref 0–37)
Albumin: 4.2 g/dL (ref 3.5–5.2)
Alkaline Phosphatase: 49 U/L (ref 39–117)
BUN: 54 mg/dL — ABNORMAL HIGH (ref 6–23)
CO2: 21 mEq/L (ref 19–32)
Calcium: 9.6 mg/dL (ref 8.4–10.5)
Chloride: 102 mEq/L (ref 96–112)
Creatinine, Ser: 1.87 mg/dL — ABNORMAL HIGH (ref 0.40–1.50)
GFR: 34.15 mL/min — ABNORMAL LOW (ref >60.00)
Glucose, Bld: 282 mg/dL — ABNORMAL HIGH (ref 70–99)
Potassium: 4.8 mEq/L (ref 3.5–5.1)
Sodium: 135 mEq/L (ref 135–145)
Total Bilirubin: 1 mg/dL (ref 0.2–1.2)
Total Protein: 6.9 g/dL (ref 6.0–8.3)

## 2021-08-26 LAB — CBC WITH DIFFERENTIAL/PLATELET
Basophils Absolute: 0 10*3/uL (ref 0.0–0.1)
Basophils Relative: 0.1 % (ref 0.0–3.0)
Eosinophils Absolute: 0 10*3/uL (ref 0.0–0.7)
Eosinophils Relative: 0.1 % (ref 0.0–5.0)
HCT: 42.7 % (ref 39.0–52.0)
Hemoglobin: 13.8 g/dL (ref 13.0–17.0)
Lymphocytes Relative: 7.5 % — ABNORMAL LOW (ref 12.0–46.0)
Lymphs Abs: 1.4 10*3/uL (ref 0.7–4.0)
MCHC: 32.3 g/dL (ref 30.0–36.0)
MCV: 97.9 fl (ref 78.0–100.0)
Monocytes Absolute: 0.8 10*3/uL (ref 0.1–1.0)
Monocytes Relative: 3.9 % (ref 3.0–12.0)
Neutro Abs: 16.9 10*3/uL — ABNORMAL HIGH (ref 1.4–7.7)
Neutrophils Relative %: 88.4 % — ABNORMAL HIGH (ref 43.0–77.0)
Platelets: 238 10*3/uL (ref 150.0–400.0)
RBC: 4.36 Mil/uL (ref 4.22–5.81)
RDW: 14.5 % (ref 11.5–15.5)
WBC: 19.1 10*3/uL (ref 4.0–10.5)

## 2021-08-26 MED ORDER — BENZONATATE 200 MG PO CAPS: 200.0000 mg | ORAL_CAPSULE | Freq: Three times a day (TID) | ORAL | 1 refills | Status: DC | PRN

## 2021-08-26 MED ORDER — PROMETHAZINE-CODEINE 6.25-10 MG/5ML PO SYRP: 5.0000 mL | ORAL_SOLUTION | Freq: Four times a day (QID) | ORAL | 0 refills | Status: DC | PRN

## 2021-08-26 MED ORDER — MOLNUPIRAVIR EUA 200MG CAPSULE: 4.0000 | ORAL_CAPSULE | Freq: Two times a day (BID) | ORAL | 0 refills | Status: AC

## 2021-08-26 MED ORDER — PROMETHAZINE-CODEINE 6.25-10 MG/5ML PO SYRP: ORAL_SOLUTION | ORAL | 0 refills | Status: DC

## 2021-08-26 NOTE — Assessment & Plan Note (Signed)
This seems to be a persistent illness rather than relapse or secondary infection.  Recognize potential for complications. Plan-CXR, labs including D-dimer, c-Met, CBC with differential.  Continue prednisone, taper as able.  Stay hydrated, warm and rested.  Cough is burdensome at the getting to hurt his abdominal wall.  He feels able to tolerate codeine-based cough syrup.

## 2021-08-26 NOTE — Assessment & Plan Note (Signed)
Underlying disease aggravated by recent COVID infection Plan-continue prednisone as directed, tapering back to maintenance 5-10 mg daily as able.  Cough suppression as tolerated.

## 2021-08-26 NOTE — Telephone Encounter (Signed)
Spoke with pt who states he has finished Z-pak given last week and does feel a little better but is still coughing, has increased congestion and is somewhat hoarse. Pt denies any fever or GI upset. Pt was scheduled with Geraldo Pitter at 11am today to evaluate. Pt did test positive for Covid on 08/10/21. Nothing further needed at this time.   Routing to [employer]  as Juluis Rainier.

## 2021-08-26 NOTE — Progress Notes (Signed)
° °HPI- °male former smoker followed for OSA, bronchitis,Upper Airway Cough, complicated by DM2/ Hyperlipidemia, CAD/CABG/ RBBB, HTN,  GERD, hx BOOP, BPH,  ° °---------------------------------------------------------------------------------- ° ° °06/17/21- 75-year-old male former smoker followed for OSA, bronchitis,  BOOP (   infiltrates w high ESR- Dr Wert), complicated by DM, CAD/CABG, Obesity,  °CPAP 10-20 /[facility] °Download-compliance 90%, AHI 4.7/ hr °Body weight today-236 lbs °Covid vax-4 Moderna + recent updated booster = 5 °Flu vax-had °-----Cpap alright other than keeping face mask on  °Gaining weight on prednisone for BOOP and will f/u w Dr Wert. Likes his FFM. Download reviewed.  ° °08/26/21- 75-year-old male former smoker(only cigars) followed for OSA, bronchitis,  BOOP ( infiltrates w high ESR- Dr Wert), complicated by DM, CAD/CABG, Obesity,  °-Prednisone 30 mg daily, Zpak °CPAP 10-20 /[facility] °Download-compliance compliance 93%, AHI 6.5/ hr °Body weight today- °Covid vax-5 Moderna °Flu vax-had °Acute visit- tested Pos Covid 12/3. Sent Zpak. Cough. °-----Patient says he's not getting any better.  °He is usually on prednisone 5 or 10 mg for chronic Boop with instruction to increase as needed.  In the last 4 days he has gone up to 30 mg daily.  Has not retested for COVID since December 3 but feels he is gradually getting weaker.  Cough is productive of white with no blood but sputum described as "black" 4 days ago.  No definite fever.  Feels congested in chest.  Denies leg pain or swelling.  Took a course of molnupiravir at original diagnosis December 3.  We discussed evaluation and options including very limited possibility of any advantage to repeating the antiviral. ° °ROS-see HPI + = positive °Constitutional:   No-   weight loss, night sweats, fevers, chills, fatigue, lassitude. °HEENT:   No-  headaches, difficulty swallowing, tooth/dental problems, sore throat,  °     No-  sneezing, itching, ear ache,  nasal congestion, post nasal drip,  °CV:  No-   chest pain, orthopnea, PND, swelling in lower extremities, anasarca, dizziness, palpitations °Resp: +shortness of breath with exertion or at rest.   °            +productive cough,  + non-productive cough,  No- coughing up of blood.   °          +change in color of mucus.  No- wheezing.   °Skin: No-   rash or lesions. °GI:  No-   heartburn, indigestion, abdominal pain, nausea, vomiting,  °GU:  °MS:  + joint pain or swelling. . °Neuro-     nothing unusual °Psych:  No- change in mood or affect. No depression or anxiety.  No memory loss. ° °OBJ °General- Alert, Oriented, Affect-appropriate, Distress- none acute °Skin- rash-none, lesions- none, excoriation- none °Lymphadenopathy- none °Head- atraumatic °           Eyes- Gross vision intact, PERRLA, conjunctivae clear secretions °           Ears- Hearing aid+ °           Nose- Clear, no-Septal dev, mucus, polyps, erosion, perforation  °           Throat- Mallampati II , mucosa clear , drainage- none, tonsils- atrophic °Neck- flexible , trachea midline, no stridor , thyroid nl, carotid no bruit °Chest - symmetrical excursion , unlabored °          Heart/CV- RRR , no murmur , no gallop  , no rub, nl s1 s2 °                          -   JVD- none , edema- none, stasis changes- none, varices- none °          Lung- clear to P&A, wheeze- none, cough+slight dry , dullness-none, rub- none °          Chest wall-  °Abd- °Br/ Gen/ Rectal- Not done, not indicated °Extrem- + healing L TKR incision °Neuro- grossly intact to observation ° ° ° ° °

## 2021-08-26 NOTE — Patient Instructions (Addendum)
Order- CXR  dx Covid infection, BOOP  Order  lab-  D-dimer, CMET, CBC w diff    dx dyspnea on exertion, Covid infection  Scripts sent to drug store:  Prometh codeine    cough syrup  Benzonatate perles- for cough  Molnupiravir antiviral  Stay well- hydrated

## 2021-08-26 NOTE — Telephone Encounter (Signed)
Fax received from pt's pharmacy stating that they no longer carry promethazine-codeine cough syrup and are requesting for an alternative to be sent in for pt.  Dr. Annamaria Boots, please advise.  Allergies  Allergen Reactions   Oxycodone-Acetaminophen Other (See Comments)    Stops breathing     Current Outpatient Medications:    allopurinol (ZYLOPRIM) 100 MG tablet, Take 1 tablet (100 mg total) by mouth 2 (two) times daily. (Patient taking differently: Take 100 mg by mouth 2 (two) times daily. Patient states taking 80mg  daily), Disp: 180 tablet, Rfl: 3   aspirin 81 MG tablet, Take 1 tablet (81 mg total) by mouth 2 (two) times daily after a meal., Disp: 30 tablet, Rfl: 0   azithromycin (ZITHROMAX Z-PAK) 250 MG tablet, Take 2 tabs today, then 1 tab until gone, Disp: 6 each, Rfl: 0   benzonatate (TESSALON) 200 MG capsule, TAKE 1 CAPSULE BY MOUTH EVERY 6 HOURS AS NEEDED, Disp: 30 capsule, Rfl: 0   benzonatate (TESSALON) 200 MG capsule, Take 1 capsule (200 mg total) by mouth 3 (three) times daily as needed for cough., Disp: 30 capsule, Rfl: 1   Bioflavonoid Products (VITAMIN C) CHEW, Chew 1 tablet by mouth daily., Disp: , Rfl:    Blood Glucose Monitoring Suppl (FREESTYLE LITE) DEVI, 1 each by Does not apply route 4 (four) times daily. E11.65, Disp: 1 each, Rfl: 0   canagliflozin (INVOKANA) 100 MG TABS tablet, Take 1 tablet (100 mg total) by mouth every other day., Disp: 30 tablet, Rfl: 0   Carboxymethylcellul-Glycerin (LUBRICATING EYE DROPS OP), Place 1 drop into both eyes daily as needed (dry eyes)., Disp: , Rfl:    Cholecalciferol (VITAMIN D3 PO), Take 1 capsule by mouth daily., Disp: , Rfl:    CINNAMON PO, Take 1,000 mg by mouth 2 (two) times daily., Disp: , Rfl:    colchicine 0.6 MG tablet, TAKE 1 TABLET BY MOUTH TWICE A DAY WITH A MEAL AS NEEDED FOR ACUTE FLARE OF GOUT, Disp: 45 tablet, Rfl: 3   Dextromethorphan-guaiFENesin (MUCINEX DM MAXIMUM STRENGTH) 60-1200 MG TB12, Take 1 tablet by mouth 2 (two)  times daily as needed (congestion). , Disp: , Rfl:    ezetimibe (ZETIA) 10 MG tablet, Take 1 tablet (10 mg total) by mouth daily., Disp: 90 tablet, Rfl: 3   famotidine (PEPCID) 20 MG tablet, TAKE 1 TABLET BY MOUTH EVERYDAY AT BEDTIME, Disp: 90 tablet, Rfl: 1   fluorouracil (EFUDEX) 5 % cream, Apply topically See admin instructions. Apply to entire scalp for 7 days (Patient not taking: Reported on 06/17/2021), Disp: 15 g, Rfl: 1   Glucosamine HCl (GLUCOSAMINE PO), Take 1,500 mg by mouth 2 (two) times daily. , Disp: , Rfl:    glucose blood (FREESTYLE LITE) test strip, 1 each by Other route 4 (four) times daily. E11.65, Disp: 400 strip, Rfl: 0   Lancets (FREESTYLE) lancets, 1 EACH BY OTHER ROUTE 4 (FOUR) TIMES DAILY. E11.65, Disp: 400 each, Rfl: 0   Lutein 20 MG CAPS, Take 20 mg by mouth daily. , Disp: , Rfl:    methocarbamol (ROBAXIN) 500 MG tablet, Take 1 tablet (500 mg total) by mouth every 8 (eight) hours as needed for muscle spasms. Caution of sedation, Disp: 40 tablet, Rfl: 1   metoprolol tartrate (LOPRESSOR) 25 MG tablet, Take 0.5 tablets (12.5 mg total) by mouth daily., Disp: 45 tablet, Rfl: 3   molnupiravir EUA (LAGEVRIO) 200 mg CAPS capsule, Take 4 capsules (800 mg total) by mouth 2 (two) times daily for  5 days., Disp: 40 capsule, Rfl: 0   Multiple Vitamins-Minerals (ZINC PO), Take 1 tablet by mouth daily., Disp: , Rfl:    omeprazole (PRILOSEC) 40 MG capsule, TAKE 1 CAPUSLE BY MOUTH 30- 60 MIN BEFORE YOUR FIRST AND LAST MEALS OF THE DAY, Disp: 180 capsule, Rfl: 3   ondansetron (ZOFRAN) 8 MG tablet, Take 1 tablet (8 mg total) by mouth every 8 (eight) hours as needed for nausea or vomiting. From narcotic pain medicine, Disp: 15 tablet, Rfl: 0   predniSONE (DELTASONE) 5 MG tablet, Take 20 mg by mouth daily with breakfast. 20 mg in the morning and 10 mg at night, Disp: , Rfl:    promethazine-codeine (PHENERGAN WITH CODEINE) 6.25-10 MG/5ML syrup, 5 ml every 6 hours if needed for cough, Disp: 200 mL,  Rfl: 0  Current Facility-Administered Medications:    betamethasone acetate-betamethasone sodium phosphate (CELESTONE) injection 3 mg, 3 mg, Intramuscular, Once, Amalia Hailey, Dorathy Daft, DPM

## 2021-08-27 ENCOUNTER — Encounter: Payer: Self-pay | Admitting: *Deleted

## 2021-08-27 MED ORDER — HYDROCODONE BIT-HOMATROP MBR 5-1.5 MG/5ML PO SOLN: 5.0000 mL | Freq: Four times a day (QID) | ORAL | 0 refills | Status: DC | PRN

## 2021-08-27 NOTE — Telephone Encounter (Signed)
Called and spoke with pt and made him aware of alternative cough syrup that was sent in due to prior one not being avail. Pt verbalized understanding. Nothing further needed.

## 2021-09-07 ENCOUNTER — Other Ambulatory Visit: Payer: Self-pay | Admitting: Endocrinology

## 2021-09-07 DIAGNOSIS — E1165 Type 2 diabetes mellitus with hyperglycemia: Secondary | ICD-10-CM

## 2021-09-13 ENCOUNTER — Telehealth: Payer: Self-pay | Admitting: Internal Medicine

## 2021-09-13 ENCOUNTER — Ambulatory Visit (INDEPENDENT_AMBULATORY_CARE_PROVIDER_SITE_OTHER): Payer: PPO | Admitting: Endocrinology

## 2021-09-13 ENCOUNTER — Other Ambulatory Visit: Payer: Self-pay

## 2021-09-13 VITALS — BP 112/58 | HR 86 | Ht 70.0 in | Wt 228.0 lb

## 2021-09-13 DIAGNOSIS — E1165 Type 2 diabetes mellitus with hyperglycemia: Secondary | ICD-10-CM

## 2021-09-13 LAB — POCT GLYCOSYLATED HEMOGLOBIN (HGB A1C): Hemoglobin A1C: 9.5 % — AB (ref 4.0–5.6)

## 2021-09-13 MED ORDER — NOVOLIN 70/30 FLEXPEN (70-30) 100 UNIT/ML ~~LOC~~ SUPN: 20.0000 [IU] | PEN_INJECTOR | Freq: Every day | SUBCUTANEOUS | 11 refills | Status: DC

## 2021-09-13 MED ORDER — OMEPRAZOLE 40 MG PO CPDR: DELAYED_RELEASE_CAPSULE | ORAL | 3 refills | Status: DC

## 2021-09-13 NOTE — Progress Notes (Signed)
Subjective:    Patient ID: Kurt Aguilar, male    DOB: [birth year], 76 y.o.   MRN: [id number]  HPI Pt returns for f/u of diabetes mellitus:  DM type: 2   Dx'ed: 5009 Complications: PN, stage 3b CRI, and CAD.   Therapy: Invokana DKA: never.  Severe hypoglycemia: never.  Pancreatitis: never.  Other: he takes intermitt prednisone for BOOP; renal insuff limits rx options; he took insulin 2018-2019.   Interval history: He is back on high-dose prednisone x 3 weeks.  Cbg increased to approx 400.  pt states he feels well in general, except for fatigue and chronic sob Past Medical History:  Diagnosis Date   Arthritis    Chronic kidney disease    Complication of anesthesia    constipation and inability to  urinate happened a few days after cataract surgery    Diabetes ([place])    type 2    Diverticulitis    GERD (gastroesophageal reflux disease)    Gout    Heart attack ([place])    Heart disease    Heart murmur    Hyperglycemia    Hyperlipidemia    Hypertension    Osteoporosis    Pneumonia    hx of BOOP followed by Dr [name]    Sleep apnea    cpapp - setting at 17     Past Surgical History:  Procedure Laterality Date   ACROMIO-CLAVICULAR JOINT REPAIR Left 05/05/2019   Procedure: SHOULDER ARTHROSCOPIC ASSISTED ACROMIO-CLAVICULAR JOINT REPAIR;  Surgeon: Tania Ade, MD;  Location: [facility];  Service: Orthopedics;  Laterality: Left;   CATARACT EXTRACTION Bilateral    CORONARY ARTERY BYPASS GRAFT     [place]   open heart surgery  09/09/1995-1998   5 bypass   REPAIR KNEE LIGAMENT     right   SHOULDER ARTHROSCOPY Left 05/05/2019   Procedure: ARTHROSCOPY SHOULDER;  Surgeon: Tania Ade, MD;  Location: [facility];  Service: Orthopedics;  Laterality: Left;   TONSILLECTOMY     TOTAL KNEE ARTHROPLASTY Left 05/29/2020   Procedure: LEFT TOTAL KNEE ARTHROPLASTY;  Surgeon: Melrose Nakayama, MD;  Location: [facility];  Service: Orthopedics;  Laterality: Left;    Social History    Socioeconomic History   Marital status: Married    Spouse name: Not on file   Number of children: 4   Years of education: 14   Highest education level: Not on file  Occupational History   Occupation: Regulatory affairs officer    Employer: RETIRED  Tobacco Use   Smoking status: Former    Packs/day: 1.00    Years: 3.00    Pack years: 3.00    Types: Cigars, Cigarettes    Quit date: 11/25/2004    Years since quitting: 16.8   Smokeless tobacco: Never   Tobacco comments:    occas. cigar quit 2006  Vaping Use   Vaping Use: Never used  Substance and Sexual Activity   Alcohol use: Yes    Alcohol/week: 1.0 standard drink    Types: 1 Standard drinks or equivalent per week    Comment: rare   Drug use: No   Sexual activity: Never  Other Topics Concern   Not on file  Social History Narrative   Not on file   Social Determinants of Health   Financial Resource Strain: Low Risk    Difficulty of Paying Living Expenses: Not hard at all  Food Insecurity: No Food Insecurity   Worried About Charity fundraiser in the Last Year: Never  true   Ran Out of Food in the Last Year: Never true  Transportation Needs: No Transportation Needs   Lack of Transportation (Medical): No   Lack of Transportation (Non-Medical): No  Physical Activity: Sufficiently Active   Days of Exercise per Week: 4 days   Minutes of Exercise per Session: 50 min  Stress: No Stress Concern Present   Feeling of Stress : Not at all  Social Connections: Not on file  Intimate Partner Violence: Not At Risk   Fear of Current or Ex-Partner: No   Emotionally Abused: No   Physically Abused: No   Sexually Abused: No    Current Outpatient Medications on File Prior to Visit  Medication Sig Dispense Refill   allopurinol (ZYLOPRIM) 100 MG tablet Take 1 tablet (100 mg total) by mouth 2 (two) times daily. (Patient taking differently: Take 100 mg by mouth 2 (two) times daily. Patient states taking 80mg  daily) 180 tablet 3   aspirin 81 MG  tablet Take 1 tablet (81 mg total) by mouth 2 (two) times daily after a meal. 30 tablet 0   azithromycin (ZITHROMAX Z-PAK) 250 MG tablet Take 2 tabs today, then 1 tab until gone 6 each 0   benzonatate (TESSALON) 200 MG capsule TAKE 1 CAPSULE BY MOUTH EVERY 6 HOURS AS NEEDED 30 capsule 0   benzonatate (TESSALON) 200 MG capsule Take 1 capsule (200 mg total) by mouth 3 (three) times daily as needed for cough. 30 capsule 1   Bioflavonoid Products (VITAMIN C) CHEW Chew 1 tablet by mouth daily.     Blood Glucose Monitoring Suppl (FREESTYLE LITE) DEVI 1 each by Does not apply route 4 (four) times daily. E11.65 1 each 0   Carboxymethylcellul-Glycerin (LUBRICATING EYE DROPS OP) Place 1 drop into both eyes daily as needed (dry eyes).     Cholecalciferol (VITAMIN D3 PO) Take 1 capsule by mouth daily.     CINNAMON PO Take 1,000 mg by mouth 2 (two) times daily.     colchicine 0.6 MG tablet TAKE 1 TABLET BY MOUTH TWICE A DAY WITH A MEAL AS NEEDED FOR ACUTE FLARE OF GOUT 45 tablet 3   Dextromethorphan-guaiFENesin (MUCINEX DM MAXIMUM STRENGTH) 60-1200 MG TB12 Take 1 tablet by mouth 2 (two) times daily as needed (congestion).      ezetimibe (ZETIA) 10 MG tablet Take 1 tablet (10 mg total) by mouth daily. 90 tablet 3   famotidine (PEPCID) 20 MG tablet TAKE 1 TABLET BY MOUTH EVERYDAY AT BEDTIME 90 tablet 1   fluorouracil (EFUDEX) 5 % cream Apply topically See admin instructions. Apply to entire scalp for 7 days 15 g 1   Glucosamine HCl (GLUCOSAMINE PO) Take 1,500 mg by mouth 2 (two) times daily.      glucose blood (FREESTYLE LITE) test strip 1 each by Other route 4 (four) times daily. E11.65 400 strip 0   HYDROcodone bit-homatropine (HYCODAN) 5-1.5 MG/5ML syrup Take 5 mLs by mouth every 6 (six) hours as needed for cough. 240 mL 0   INVOKANA 100 MG TABS tablet TAKE 1 TABLET BY MOUTH EVERY OTHER DAY 30 tablet 5   Lancets (FREESTYLE) lancets 1 EACH BY OTHER ROUTE 4 (FOUR) TIMES DAILY. E11.65 400 each 0   Lutein 20 MG  CAPS Take 20 mg by mouth daily.      methocarbamol (ROBAXIN) 500 MG tablet Take 1 tablet (500 mg total) by mouth every 8 (eight) hours as needed for muscle spasms. Caution of sedation 40 tablet 1   metoprolol  tartrate (LOPRESSOR) 25 MG tablet Take 0.5 tablets (12.5 mg total) by mouth daily. 45 tablet 3   Multiple Vitamins-Minerals (ZINC PO) Take 1 tablet by mouth daily.     ondansetron (ZOFRAN) 8 MG tablet Take 1 tablet (8 mg total) by mouth every 8 (eight) hours as needed for nausea or vomiting. From narcotic pain medicine 15 tablet 0   predniSONE (DELTASONE) 5 MG tablet Take 20 mg by mouth daily with breakfast. 20 mg in the morning and 10 mg at night     Current Facility-Administered Medications on File Prior to Visit  Medication Dose Route Frequency Provider Last Rate Last Admin   betamethasone acetate-betamethasone sodium phosphate (CELESTONE) injection 3 mg  3 mg Intramuscular Once Edrick Kins, DPM        Allergies  Allergen Reactions   Oxycodone-Acetaminophen Other (See Comments)    Stops breathing    Family History  Problem Relation Age of Onset   Heart disease Mother    Heart disease Father    Stroke Father    Heart attack Son 64   Kidney disease Neg Hx    Prostate cancer Neg Hx    Diabetes Neg Hx     BP (!) 112/58    Pulse 86    Ht 5\' 10"  (1.778 m)    Wt 228 lb (103.4 kg)    SpO2 97%    BMI 32.71 kg/m    Review of Systems Denies n/v    Objective:   Physical Exam    Lab Results  Component Value Date   CREATININE 1.87 (H) 08/26/2021   BUN 54 (H) 08/26/2021   NA 135 08/26/2021   K 4.8 08/26/2021   CL 102 08/26/2021   CO2 21 08/26/2021   Lab Results  Component Value Date   HGBA1C 9.5 (A) 09/13/2021      Assessment & Plan:  Type 2 DM: uncontrolled, due to prednisone.    Patient Instructions  Please continue the same Invokana.  I have sent a prescription to your pharmacy, to add 70/30 insulin.  Take 20 units now, then start 20 units with breakfast  tomorrow. If you cannot afford this, you should buy 70/30 at [facility] without insurance or a prescription.  Please call or message [place] next week, to tell us how the blood sugar is doing.   check your blood sugar twice a day.  vary the time of day when you check, between before the 3 meals, and at bedtime.  also check if you have symptoms of your blood sugar being too high or too low.  please keep a record of the readings and bring it to your next appointment here (or you can bring the meter itself).  You can write it on any piece of paper.  please call us sooner if your blood sugar goes below 70, or if most of your readings are over 200.   Please come back for a follow-up appointment in 1 month.

## 2021-09-13 NOTE — Telephone Encounter (Signed)
Omeprazole Rx has been sent to preferred pharmacy.  Patient is aware and voiced his understanding.  Nothing further needed.

## 2021-09-13 NOTE — Patient Instructions (Addendum)
Please continue the same Invokana.  I have sent a prescription to your pharmacy, to add 70/30 insulin.  Take 20 units now, then start 20 units with breakfast tomorrow. If you cannot afford this, you should buy 70/30 at [facility] without insurance or a prescription.  Please call or message [place] next week, to tell us how the blood sugar is doing.   check your blood sugar twice a day.  vary the time of day when you check, between before the 3 meals, and at bedtime.  also check if you have symptoms of your blood sugar being too high or too low.  please keep a record of the readings and bring it to your next appointment here (or you can bring the meter itself).  You can write it on any piece of paper.  please call us sooner if your blood sugar goes below 70, or if most of your readings are over 200.   Please come back for a follow-up appointment in 1 month.

## 2021-09-16 ENCOUNTER — Telehealth: Payer: Self-pay | Admitting: Endocrinology

## 2021-09-16 NOTE — Telephone Encounter (Signed)
Updating blood sugar per Dr Loanne Drilling request - still weak, shaky, & nauseous    Date Time Reading Notes  09/13/21 PM 400 Took insulin  09/13/21 PM 400 After dinner  09/14/21 AM 187 fasting  09/14/21 AM 190 After breakfast  09/14/21 PM 201 After lunch  09/14/21 PM 181 Mid-afternoon  09/14/21 PM 207 After dinner  09/15/21 AM 139 Fasting  09/15/21 AM 246 After breakfast  09/15/21 PM 323 After lunch  09/15/21 PM 308 After dinner  09/16/21 09/16/21 AM AM 198 308 Fasting After breakfast   Please call patient at [phone]

## 2021-09-17 ENCOUNTER — Inpatient Hospital Stay (HOSPITAL_COMMUNITY)
Admission: EM | Admit: 2021-09-17 | Discharge: 2021-09-25 | DRG: 280 | Disposition: A | Discharge status: Skilled Nursing Facility | Payer: PPO | Attending: Internal Medicine | Admitting: Internal Medicine

## 2021-09-17 ENCOUNTER — Emergency Department (HOSPITAL_COMMUNITY): Payer: PPO

## 2021-09-17 ENCOUNTER — Telehealth: Payer: Self-pay

## 2021-09-17 ENCOUNTER — Emergency Department (HOSPITAL_BASED_OUTPATIENT_CLINIC_OR_DEPARTMENT_OTHER): Payer: PPO

## 2021-09-17 ENCOUNTER — Telehealth: Payer: Self-pay | Admitting: Cardiovascular Disease

## 2021-09-17 ENCOUNTER — Other Ambulatory Visit: Payer: Self-pay

## 2021-09-17 DIAGNOSIS — Z7982 Long term (current) use of aspirin: Secondary | ICD-10-CM | POA: Diagnosis not present

## 2021-09-17 DIAGNOSIS — Z7952 Long term (current) use of systemic steroids: Secondary | ICD-10-CM | POA: Diagnosis not present

## 2021-09-17 DIAGNOSIS — I249 Acute ischemic heart disease, unspecified: Secondary | ICD-10-CM

## 2021-09-17 DIAGNOSIS — N189 Chronic kidney disease, unspecified: Secondary | ICD-10-CM

## 2021-09-17 DIAGNOSIS — E669 Obesity, unspecified: Secondary | ICD-10-CM | POA: Diagnosis present

## 2021-09-17 DIAGNOSIS — R278 Other lack of coordination: Secondary | ICD-10-CM | POA: Diagnosis not present

## 2021-09-17 DIAGNOSIS — I2699 Other pulmonary embolism without acute cor pulmonale: Secondary | ICD-10-CM | POA: Diagnosis not present

## 2021-09-17 DIAGNOSIS — Z6832 Body mass index (BMI) 32.0-32.9, adult: Secondary | ICD-10-CM | POA: Diagnosis not present

## 2021-09-17 DIAGNOSIS — R609 Edema, unspecified: Secondary | ICD-10-CM | POA: Diagnosis not present

## 2021-09-17 DIAGNOSIS — Z885 Allergy status to narcotic agent status: Secondary | ICD-10-CM

## 2021-09-17 DIAGNOSIS — M810 Age-related osteoporosis without current pathological fracture: Secondary | ICD-10-CM | POA: Diagnosis not present

## 2021-09-17 DIAGNOSIS — I2510 Atherosclerotic heart disease of native coronary artery without angina pectoris: Secondary | ICD-10-CM | POA: Diagnosis not present

## 2021-09-17 DIAGNOSIS — I4510 Unspecified right bundle-branch block: Secondary | ICD-10-CM | POA: Diagnosis not present

## 2021-09-17 DIAGNOSIS — R0789 Other chest pain: Secondary | ICD-10-CM | POA: Diagnosis not present

## 2021-09-17 DIAGNOSIS — E860 Dehydration: Secondary | ICD-10-CM | POA: Diagnosis present

## 2021-09-17 DIAGNOSIS — Z8616 Personal history of COVID-19: Secondary | ICD-10-CM

## 2021-09-17 DIAGNOSIS — J189 Pneumonia, unspecified organism: Secondary | ICD-10-CM

## 2021-09-17 DIAGNOSIS — I10 Essential (primary) hypertension: Secondary | ICD-10-CM

## 2021-09-17 DIAGNOSIS — E1165 Type 2 diabetes mellitus with hyperglycemia: Secondary | ICD-10-CM | POA: Diagnosis not present

## 2021-09-17 DIAGNOSIS — E119 Type 2 diabetes mellitus without complications: Secondary | ICD-10-CM

## 2021-09-17 DIAGNOSIS — K5900 Constipation, unspecified: Secondary | ICD-10-CM | POA: Diagnosis present

## 2021-09-17 DIAGNOSIS — N1832 Chronic kidney disease, stage 3b: Secondary | ICD-10-CM | POA: Diagnosis not present

## 2021-09-17 DIAGNOSIS — N179 Acute kidney failure, unspecified: Secondary | ICD-10-CM | POA: Diagnosis present

## 2021-09-17 DIAGNOSIS — G4730 Sleep apnea, unspecified: Secondary | ICD-10-CM | POA: Diagnosis present

## 2021-09-17 DIAGNOSIS — R079 Chest pain, unspecified: Secondary | ICD-10-CM

## 2021-09-17 DIAGNOSIS — R000 Tachycardia, unspecified: Secondary | ICD-10-CM | POA: Diagnosis not present

## 2021-09-17 DIAGNOSIS — E785 Hyperlipidemia, unspecified: Secondary | ICD-10-CM | POA: Diagnosis present

## 2021-09-17 DIAGNOSIS — Z951 Presence of aortocoronary bypass graft: Secondary | ICD-10-CM | POA: Diagnosis not present

## 2021-09-17 DIAGNOSIS — R778 Other specified abnormalities of plasma proteins: Secondary | ICD-10-CM

## 2021-09-17 DIAGNOSIS — Z794 Long term (current) use of insulin: Secondary | ICD-10-CM | POA: Diagnosis not present

## 2021-09-17 DIAGNOSIS — M6281 Muscle weakness (generalized): Secondary | ICD-10-CM | POA: Diagnosis not present

## 2021-09-17 DIAGNOSIS — F1920 Other psychoactive substance dependence, uncomplicated: Secondary | ICD-10-CM

## 2021-09-17 DIAGNOSIS — I129 Hypertensive chronic kidney disease with stage 1 through stage 4 chronic kidney disease, or unspecified chronic kidney disease: Secondary | ICD-10-CM | POA: Diagnosis present

## 2021-09-17 DIAGNOSIS — R7989 Other specified abnormal findings of blood chemistry: Secondary | ICD-10-CM

## 2021-09-17 DIAGNOSIS — M7989 Other specified soft tissue disorders: Secondary | ICD-10-CM | POA: Diagnosis present

## 2021-09-17 DIAGNOSIS — Z741 Need for assistance with personal care: Secondary | ICD-10-CM | POA: Diagnosis not present

## 2021-09-17 DIAGNOSIS — I222 Subsequent non-ST elevation (NSTEMI) myocardial infarction: Secondary | ICD-10-CM | POA: Diagnosis not present

## 2021-09-17 DIAGNOSIS — Z823 Family history of stroke: Secondary | ICD-10-CM

## 2021-09-17 DIAGNOSIS — J8489 Other specified interstitial pulmonary diseases: Secondary | ICD-10-CM | POA: Diagnosis not present

## 2021-09-17 DIAGNOSIS — Z79899 Other long term (current) drug therapy: Secondary | ICD-10-CM

## 2021-09-17 DIAGNOSIS — E1122 Type 2 diabetes mellitus with diabetic chronic kidney disease: Secondary | ICD-10-CM | POA: Diagnosis present

## 2021-09-17 DIAGNOSIS — R053 Chronic cough: Secondary | ICD-10-CM | POA: Diagnosis present

## 2021-09-17 DIAGNOSIS — E875 Hyperkalemia: Secondary | ICD-10-CM

## 2021-09-17 DIAGNOSIS — I21A1 Myocardial infarction type 2: Secondary | ICD-10-CM | POA: Diagnosis not present

## 2021-09-17 DIAGNOSIS — K219 Gastro-esophageal reflux disease without esophagitis: Secondary | ICD-10-CM | POA: Diagnosis present

## 2021-09-17 DIAGNOSIS — R2681 Unsteadiness on feet: Secondary | ICD-10-CM | POA: Diagnosis not present

## 2021-09-17 DIAGNOSIS — I252 Old myocardial infarction: Secondary | ICD-10-CM

## 2021-09-17 DIAGNOSIS — M109 Gout, unspecified: Secondary | ICD-10-CM | POA: Diagnosis present

## 2021-09-17 DIAGNOSIS — Z96652 Presence of left artificial knee joint: Secondary | ICD-10-CM | POA: Diagnosis present

## 2021-09-17 DIAGNOSIS — Z8249 Family history of ischemic heart disease and other diseases of the circulatory system: Secondary | ICD-10-CM

## 2021-09-17 DIAGNOSIS — R739 Hyperglycemia, unspecified: Secondary | ICD-10-CM | POA: Diagnosis not present

## 2021-09-17 DIAGNOSIS — R5383 Other fatigue: Secondary | ICD-10-CM | POA: Diagnosis not present

## 2021-09-17 DIAGNOSIS — E872 Acidosis, unspecified: Secondary | ICD-10-CM | POA: Diagnosis present

## 2021-09-17 DIAGNOSIS — I214 Non-ST elevation (NSTEMI) myocardial infarction: Secondary | ICD-10-CM | POA: Diagnosis not present

## 2021-09-17 DIAGNOSIS — R0602 Shortness of breath: Secondary | ICD-10-CM | POA: Diagnosis not present

## 2021-09-17 DIAGNOSIS — Z886 Allergy status to analgesic agent status: Secondary | ICD-10-CM

## 2021-09-17 LAB — CBG MONITORING, ED
Glucose-Capillary: 133 mg/dL — ABNORMAL HIGH (ref 70–99)
Glucose-Capillary: 242 mg/dL — ABNORMAL HIGH (ref 70–99)

## 2021-09-17 LAB — I-STAT VENOUS BLOOD GAS, ED
Acid-base deficit: 7 mmol/L — ABNORMAL HIGH (ref 0.0–2.0)
Bicarbonate: 19.3 mmol/L — ABNORMAL LOW (ref 20.0–28.0)
Calcium, Ion: 1.15 mmol/L (ref 1.15–1.40)
HCT: 35 % — ABNORMAL LOW (ref 39.0–52.0)
Hemoglobin: 11.9 g/dL — ABNORMAL LOW (ref 13.0–17.0)
O2 Saturation: 97 %
Potassium: 4.6 mmol/L (ref 3.5–5.1)
Sodium: 138 mmol/L (ref 135–145)
TCO2: 20 mmol/L — ABNORMAL LOW (ref 22–32)
pCO2, Ven: 38.6 mmHg — ABNORMAL LOW (ref 44.0–60.0)
pH, Ven: 7.306 (ref 7.250–7.430)
pO2, Ven: 97 mmHg — ABNORMAL HIGH (ref 32.0–45.0)

## 2021-09-17 LAB — CBC
HCT: 44 % (ref 39.0–52.0)
Hemoglobin: 14.3 g/dL (ref 13.0–17.0)
MCH: 32.6 pg (ref 26.0–34.0)
MCHC: 32.5 g/dL (ref 30.0–36.0)
MCV: 100.5 fL — ABNORMAL HIGH (ref 80.0–100.0)
Platelets: 172 10*3/uL (ref 150–400)
RBC: 4.38 MIL/uL (ref 4.22–5.81)
RDW: 13.7 % (ref 11.5–15.5)
WBC: 13.7 10*3/uL — ABNORMAL HIGH (ref 4.0–10.5)
nRBC: 0.1 % (ref 0.0–0.2)

## 2021-09-17 LAB — RESP PANEL BY RT-PCR (FLU A&B, COVID) ARPGX2
Influenza A by PCR: NEGATIVE
Influenza B by PCR: NEGATIVE
SARS Coronavirus 2 by RT PCR: NEGATIVE

## 2021-09-17 LAB — TROPONIN I (HIGH SENSITIVITY): Troponin I (High Sensitivity): 195 ng/L (ref <18)

## 2021-09-17 LAB — BASIC METABOLIC PANEL
Anion gap: 18 — ABNORMAL HIGH (ref 5–15)
BUN: 60 mg/dL — ABNORMAL HIGH (ref 8–23)
CO2: 15 mmol/L — ABNORMAL LOW (ref 22–32)
Calcium: 9.6 mg/dL (ref 8.9–10.3)
Chloride: 104 mmol/L (ref 98–111)
Creatinine, Ser: 2.34 mg/dL — ABNORMAL HIGH (ref 0.61–1.24)
GFR, Estimated: 28 mL/min — ABNORMAL LOW (ref >60)
Glucose, Bld: 268 mg/dL — ABNORMAL HIGH (ref 70–99)
Potassium: 5.4 mmol/L — ABNORMAL HIGH (ref 3.5–5.1)
Sodium: 137 mmol/L (ref 135–145)

## 2021-09-17 LAB — D-DIMER, QUANTITATIVE: D-Dimer, Quant: 0.34 ug/mL-FEU (ref 0.00–0.50)

## 2021-09-17 LAB — LACTIC ACID, PLASMA
Lactic Acid, Venous: 4.1 mmol/L (ref 0.5–1.9)
Lactic Acid, Venous: 3.4 mmol/L (ref 0.5–1.9)

## 2021-09-17 LAB — BRAIN NATRIURETIC PEPTIDE: B Natriuretic Peptide: 37.7 pg/mL (ref 0.0–100.0)

## 2021-09-17 MED ORDER — EZETIMIBE 10 MG PO TABS
10.0000 mg | ORAL_TABLET | Freq: Every day | ORAL | Status: DC
  Administered 2021-09-18 – 2021-09-25 (×8): 10 mg via ORAL
  Filled 2021-09-17 (×8): qty 1

## 2021-09-17 MED ORDER — PREDNISONE 10 MG PO TABS
10.0000 mg | ORAL_TABLET | Freq: Two times a day (BID) | ORAL | Status: DC
  Administered 2021-09-17 – 2021-09-20 (×6): 10 mg via ORAL
  Filled 2021-09-17 (×6): qty 1

## 2021-09-17 MED ORDER — INSULIN ASPART 100 UNIT/ML IJ SOLN
2.0000 [IU] | Freq: Once | INTRAMUSCULAR | Status: AC
  Administered 2021-09-17: 2 [IU] via SUBCUTANEOUS

## 2021-09-17 MED ORDER — ASPIRIN 81 MG PO CHEW
81.0000 mg | CHEWABLE_TABLET | Freq: Two times a day (BID) | ORAL | Status: DC
  Administered 2021-09-18 (×2): 81 mg via ORAL
  Filled 2021-09-17 (×2): qty 1

## 2021-09-17 MED ORDER — ACETAMINOPHEN 325 MG PO TABS: 650.0000 mg | ORAL_TABLET | ORAL | Status: DC | PRN

## 2021-09-17 MED ORDER — FAMOTIDINE 20 MG PO TABS
20.0000 mg | ORAL_TABLET | Freq: Every day | ORAL | Status: DC
  Administered 2021-09-17 – 2021-09-24 (×8): 20 mg via ORAL
  Filled 2021-09-17 (×8): qty 1

## 2021-09-17 MED ORDER — SODIUM CHLORIDE 0.9 % IV BOLUS
500.0000 mL | Freq: Once | INTRAVENOUS | Status: AC
  Administered 2021-09-17: 500 mL via INTRAVENOUS

## 2021-09-17 MED ORDER — INSULIN ASPART 100 UNIT/ML IJ SOLN
0.0000 [IU] | Freq: Three times a day (TID) | INTRAMUSCULAR | Status: DC
  Administered 2021-09-18: 3 [IU] via SUBCUTANEOUS
  Administered 2021-09-18: 2 [IU] via SUBCUTANEOUS
  Administered 2021-09-18: 3 [IU] via SUBCUTANEOUS
  Administered 2021-09-19: 15 [IU] via SUBCUTANEOUS
  Administered 2021-09-19: 3 [IU] via SUBCUTANEOUS
  Administered 2021-09-20: 2 [IU] via SUBCUTANEOUS
  Administered 2021-09-20: 3 [IU] via SUBCUTANEOUS
  Administered 2021-09-20 – 2021-09-21 (×2): 5 [IU] via SUBCUTANEOUS
  Administered 2021-09-21: 3 [IU] via SUBCUTANEOUS
  Administered 2021-09-21: 8 [IU] via SUBCUTANEOUS
  Administered 2021-09-22: 15 [IU] via SUBCUTANEOUS
  Administered 2021-09-22 – 2021-09-23 (×2): 5 [IU] via SUBCUTANEOUS
  Administered 2021-09-23: 8 [IU] via SUBCUTANEOUS
  Administered 2021-09-23: 3 [IU] via SUBCUTANEOUS
  Administered 2021-09-24: 11 [IU] via SUBCUTANEOUS
  Administered 2021-09-24: 2 [IU] via SUBCUTANEOUS
  Administered 2021-09-24 – 2021-09-25 (×3): 3 [IU] via SUBCUTANEOUS

## 2021-09-17 MED ORDER — HEPARIN (PORCINE) 25000 UT/250ML-% IV SOLN
1200.0000 [IU]/h | INTRAVENOUS | Status: DC
  Administered 2021-09-17: 1200 [IU]/h via INTRAVENOUS
  Filled 2021-09-17: qty 250

## 2021-09-17 MED ORDER — PANTOPRAZOLE SODIUM 40 MG PO TBEC
80.0000 mg | DELAYED_RELEASE_TABLET | Freq: Two times a day (BID) | ORAL | Status: DC
  Administered 2021-09-18 – 2021-09-25 (×15): 80 mg via ORAL
  Filled 2021-09-17 (×15): qty 2

## 2021-09-17 MED ORDER — INSULIN NPH (HUMAN) (ISOPHANE) 100 UNIT/ML ~~LOC~~ SUSP
10.0000 [IU] | Freq: Every day | SUBCUTANEOUS | Status: DC
  Administered 2021-09-18: 10 [IU] via SUBCUTANEOUS
  Filled 2021-09-17: qty 10

## 2021-09-17 MED ORDER — NITROGLYCERIN 0.4 MG SL SUBL
0.4000 mg | SUBLINGUAL_TABLET | SUBLINGUAL | Status: DC | PRN
  Administered 2021-09-17 – 2021-09-19 (×4): 0.4 mg via SUBLINGUAL
  Filled 2021-09-17 (×4): qty 1

## 2021-09-17 MED ORDER — ROSUVASTATIN CALCIUM 20 MG PO TABS
40.0000 mg | ORAL_TABLET | Freq: Every day | ORAL | Status: DC
  Administered 2021-09-18 – 2021-09-25 (×8): 40 mg via ORAL
  Filled 2021-09-17 (×8): qty 2

## 2021-09-17 MED ORDER — ONDANSETRON HCL 4 MG/2ML IJ SOLN: 4.0000 mg | Freq: Four times a day (QID) | INTRAMUSCULAR | Status: DC | PRN

## 2021-09-17 MED ORDER — INSULIN ASPART 100 UNIT/ML IJ SOLN
0.0000 [IU] | Freq: Every day | INTRAMUSCULAR | Status: DC
  Administered 2021-09-17: 2 [IU] via SUBCUTANEOUS
  Administered 2021-09-18 – 2021-09-23 (×4): 3 [IU] via SUBCUTANEOUS
  Administered 2021-09-24: 4 [IU] via SUBCUTANEOUS

## 2021-09-17 MED ORDER — TECHNETIUM TO 99M ALBUMIN AGGREGATED
4.2000 | Freq: Once | INTRAVENOUS | Status: AC | PRN
  Administered 2021-09-17: 4.2 via INTRAVENOUS

## 2021-09-17 MED ORDER — HEPARIN BOLUS VIA INFUSION
4000.0000 [IU] | Freq: Once | INTRAVENOUS | Status: AC
  Administered 2021-09-17: 4000 [IU] via INTRAVENOUS
  Filled 2021-09-17: qty 4000

## 2021-09-17 MED ORDER — ALBUTEROL SULFATE HFA 108 (90 BASE) MCG/ACT IN AERS
2.0000 | INHALATION_SPRAY | Freq: Once | RESPIRATORY_TRACT | Status: AC
  Administered 2021-09-17: 2 via RESPIRATORY_TRACT
  Filled 2021-09-17: qty 6.7

## 2021-09-17 NOTE — Telephone Encounter (Signed)
Message sent thru [place] regarding Lab results/recommendations

## 2021-09-17 NOTE — Telephone Encounter (Signed)
I spoke with pt who did sound out of breath and pt said he was already at [facility] ED. Sending note to [name] and Pedro [place] CMA.

## 2021-09-17 NOTE — ED Provider Notes (Signed)
[place] [facility] EMERGENCY DEPARTMENT Provider Note   CSN: [id number] Arrival date & time: 09/17/21  1138    History  Chief Complaint  Patient presents with   Chest Pain   Shortness of Breath   Fatigue    Kurt Aguilar is a 76 y.o. male with past medical history significant for 5 vessel CABG, hypertension, elevated blood sugar after steroid use who presents for evaluation of worsening chest pain, shortness of breath.  Diagnosed with COVID about 1 month ago.  Subsequently developed BOOP.  Followed by Dr. [name] with pulmonology.  Has had worsening pleuritic and exertional chest pain and shortness of breath over the last 3 weeks, worsening over the last few days (Sunday)  Has noted some lower extremity swelling which patient states he feels this is chronic.  He denies any prior history of heart failure.  He is followed by [facility] in [place].  He still has chronic cough.  Denies any hemoptysis.  No history of PE or DVT.  Does state he has decrease in activity due to his worsening chest pain and shortness of breath.  He is unsure if this feels like his prior MI.  Does admit to recently starting insulin due to being placed on 20 mg prednisone for his BOOP.  Denies PND, orthopnea. No fever, emesis, abd pain, back pain. Feels generally weak.   HPI     Home Medications Prior to Admission medications   Medication Sig Start Date End Date Taking? Authorizing Provider  allopurinol (ZYLOPRIM) 100 MG tablet Take 1 tablet (100 mg total) by mouth 2 (two) times daily. Patient taking differently: Take 100 mg by mouth 2 (two) times daily. Patient states taking 80mg  daily 04/12/21  Yes Tower, [name], MD  aspirin 81 MG tablet Take 1 tablet (81 mg total) by mouth 2 (two) times daily after a meal. 05/29/20  Yes Nida, Mitzi Hansen, PA-C  benzonatate (TESSALON) 200 MG capsule Take 1 capsule (200 mg total) by mouth 3 (three) times daily as needed for cough. 08/26/21  Yes Young, [name], MD   Bioflavonoid Products (VITAMIN C) CHEW Chew 1 tablet by mouth daily.   Yes [provider]  Carboxymethylcellul-Glycerin (LUBRICATING EYE DROPS OP) Place 1 drop into both eyes daily as needed (dry eyes).   Yes [provider]  Cholecalciferol (VITAMIN D3 PO) Take 1 capsule by mouth daily.   Yes [provider]  CINNAMON PO Take 1,000 mg by mouth 2 (two) times daily.   Yes [provider]  colchicine 0.6 MG tablet TAKE 1 TABLET BY MOUTH TWICE A DAY WITH A MEAL AS NEEDED FOR ACUTE FLARE OF GOUT 03/16/18  Yes Tower, [name], MD  Dextromethorphan-guaiFENesin (MUCINEX DM MAXIMUM STRENGTH) 60-1200 MG TB12 Take 1 tablet by mouth 2 (two) times daily as needed (congestion).    Yes [provider]  ezetimibe (ZETIA) 10 MG tablet Take 1 tablet (10 mg total) by mouth daily. 04/12/21  Yes Tower, [name], MD  famotidine (PEPCID) 20 MG tablet TAKE 1 TABLET BY MOUTH EVERYDAY AT BEDTIME 08/21/21  Yes Tanda Rockers, MD  Glucosamine HCl (GLUCOSAMINE PO) Take 1,500 mg by mouth 2 (two) times daily.    Yes [provider]  HYDROcodone bit-homatropine (HYCODAN) 5-1.5 MG/5ML syrup Take 5 mLs by mouth every 6 (six) hours as needed for cough. 08/27/21  Yes Young, Tarri Fuller D, MD  insulin isophane & regular human KwikPen (NOVOLIN 70/30 KWIKPEN) (70-30) 100 UNIT/ML KwikPen Inject 20 Units  into the skin daily with breakfast. And pen needles 1/day 09/13/21  Yes Renato Shin, MD  INVOKANA 100 MG TABS tablet TAKE 1 TABLET BY MOUTH EVERY OTHER DAY 09/10/21  Yes Renato Shin, MD  Lutein 20 MG CAPS Take 20 mg by mouth daily.    Yes [provider]  methocarbamol (ROBAXIN) 500 MG tablet Take 1 tablet (500 mg total) by mouth every 8 (eight) hours as needed for muscle spasms. Caution of sedation 05/29/20  Yes Loni Dolly, PA-C  metoprolol tartrate (LOPRESSOR) 25 MG tablet Take 0.5 tablets (12.5 mg total) by mouth daily. 04/17/20  Yes Gollan, Kathlene November, MD  Multiple  Vitamins-Minerals (ZINC PO) Take 1 tablet by mouth daily.   Yes [provider]  omeprazole (PRILOSEC) 40 MG capsule TAKE 1 CAPUSLE BY MOUTH 30- 60 MIN BEFORE YOUR FIRST AND LAST MEALS OF THE DAY 09/13/21  Yes Tanda Rockers, MD  ondansetron (ZOFRAN) 8 MG tablet Take 1 tablet (8 mg total) by mouth every 8 (eight) hours as needed for nausea or vomiting. From narcotic pain medicine 01/03/20  Yes Tower, [name], MD  predniSONE (DELTASONE) 5 MG tablet Take 20 mg by mouth daily with breakfast. 20 mg in the morning and 10 mg at night   Yes [provider]  Blood Glucose Monitoring Suppl (FREESTYLE LITE) DEVI 1 each by Does not apply route 4 (four) times daily. E11.65 01/16/20   Renato Shin, MD  glucose blood (FREESTYLE LITE) test strip 1 each by Other route 4 (four) times daily. E11.65 04/10/20   Renato Shin, MD  Lancets (FREESTYLE) lancets 1 EACH BY OTHER ROUTE 4 (FOUR) TIMES DAILY. E11.65 08/23/20   Renato Shin, MD     Allergies    Oxycodone-acetaminophen    Review of Systems   Review of Systems  Constitutional:  Positive for activity change and fatigue.  HENT: Negative.    Respiratory:  Positive for cough and shortness of breath.   Cardiovascular:  Positive for chest pain and leg swelling. Negative for palpitations.  Gastrointestinal: Negative.   Genitourinary: Negative.   Musculoskeletal: Negative.   Skin: Negative.   Neurological:  Positive for weakness (generalized).  All other systems reviewed and are negative.  Physical Exam Updated Vital Signs BP 140/79    Pulse 71    Temp 98.2 F (36.8 C) (Oral)    Resp 11    SpO2 100%  Physical Exam Vitals and nursing note reviewed.  Constitutional:      General: He is not in acute distress.    Appearance: He is well-developed. He is obese. He is ill-appearing. He is not toxic-appearing or diaphoretic.  HENT:     Head: Normocephalic and atraumatic.  Eyes:     Pupils: Pupils are equal, round, and reactive to light.   Cardiovascular:     Rate and Rhythm: Normal rate and regular rhythm.     Pulses:          Radial pulses are 2+ on the right side and 2+ on the left side.       Dorsalis pedis pulses are 2+ on the right side and 2+ on the left side.     Heart sounds: Normal heart sounds.  Pulmonary:     Effort: Pulmonary effort is normal. No respiratory distress.     Breath sounds: Normal breath sounds.  Chest:     Chest wall: No mass or tenderness.     Comments: Non tender, midline old sternal incision without overlying erythema,  warmth Abdominal:     General: Bowel sounds are normal. There is no distension or abdominal bruit.     Palpations: Abdomen is soft. There is no hepatomegaly or mass.     Tenderness: There is no abdominal tenderness. There is no guarding or rebound.  Musculoskeletal:        General: Normal range of motion.     Cervical back: Normal range of motion and neck supple.     Right lower leg: No tenderness. Edema present.     Left lower leg: No tenderness. Edema present.     Comments: No bony tenderness, 2 + pitting edema to knee Bl. Full ROM without difficulty  Skin:    General: Skin is warm and dry.     Capillary Refill: Capillary refill takes less than 2 seconds.  Neurological:     General: No focal deficit present.     Mental Status: He is alert and oriented to person, place, and time.     Comments: CN 2-12 grossly intact Intact sensation Equal strength   ED Results / Procedures / Treatments   Labs (all labs ordered are listed, but only abnormal results are displayed) Labs Reviewed  BASIC METABOLIC PANEL - Abnormal; Notable for the following components:      Result Value   Potassium 5.4 (*)    CO2 15 (*)    Glucose, Bld 268 (*)    BUN 60 (*)    Creatinine, Ser 2.34 (*)    GFR, Estimated 28 (*)    Anion gap 18 (*)    All other components within normal limits  CBC - Abnormal; Notable for the following components:   WBC 13.7 (*)    MCV 100.5 (*)    All other  components within normal limits  LACTIC ACID, PLASMA - Abnormal; Notable for the following components:   Lactic Acid, Venous 4.1 (*)    All other components within normal limits  LACTIC ACID, PLASMA - Abnormal; Notable for the following components:   Lactic Acid, Venous 3.4 (*)    All other components within normal limits  I-STAT VENOUS BLOOD GAS, ED - Abnormal; Notable for the following components:   pCO2, Ven 38.6 (*)    pO2, Ven 97.0 (*)    Bicarbonate 19.3 (*)    TCO2 20 (*)    Acid-base deficit 7.0 (*)    HCT 35.0 (*)    Hemoglobin 11.9 (*)    All other components within normal limits  CBG MONITORING, ED - Abnormal; Notable for the following components:   Glucose-Capillary 133 (*)    All other components within normal limits  TROPONIN I (HIGH SENSITIVITY) - Abnormal; Notable for the following components:   Troponin I (High Sensitivity) 250 (*)    All other components within normal limits  TROPONIN I (HIGH SENSITIVITY) - Abnormal; Notable for the following components:   Troponin I (High Sensitivity) 195 (*)    All other components within normal limits  RESP PANEL BY RT-PCR (FLU A&B, COVID) ARPGX2  BRAIN NATRIURETIC PEPTIDE  D-DIMER, QUANTITATIVE  HEPATIC FUNCTION PANEL  CBC  BASIC METABOLIC PANEL  HEPARIN LEVEL (UNFRACTIONATED)   EKG EKG Interpretation  Date/Time:  Tuesday September 17 2021 11:44:13 EST Ventricular Rate:  116 PR Interval:  140 QRS Duration: 128 QT Interval:  336 QTC Calculation: 467 R Axis:   -62 Text Interpretation: Sinus tachycardia Right bundle branch block Left anterior fascicular block Minimal voltage criteria for LVH, may be normal variant ( R in  aVL ) Cannot rule out Inferior infarct (masked by fascicular block?) , age undetermined Anterior infarct , age undetermined Abnormal ECG No previous ECGs available agree. NO STEMI Confirmed by Charlesetta Shanks [phone]) on 09/17/2021 5:20:35 PM  Radiology DG Chest 1 View  Result Date: 09/17/2021 CLINICAL  DATA:  Chest pressure and shortness of breath. EXAM: CHEST  1 VIEW COMPARISON:  Chest radiograph 08/26/2021, CT chest 12/19/2015 FINDINGS: Status post median sternotomy. Cardiac silhouette is again at the upper limits of normal size. Mild to moderate calcifications within the aortic arch. There are again calcified nodular densities within left lung apex, unchanged. These are consistent with the calcified pleural plaques seen on prior CT. No focal airspace opacity to indicate pneumonia. No pleural effusion or pneumothorax. No acute skeletal abnormality. IMPRESSION: No acute lung process.  Unchanged chronic findings as above. Electronically Signed   By: Yvonne Kendall   On: 09/17/2021 13:04   NM Pulmonary Perfusion  Result Date: 09/17/2021 CLINICAL DATA:  Pulmonary embolism (PE) suspected, high prob. Shortness of breath, chest pain EXAM: NUCLEAR MEDICINE PERFUSION LUNG SCAN TECHNIQUE: Perfusion images were obtained in multiple projections after intravenous injection of radiopharmaceutical. Ventilation scans intentionally deferred if perfusion scan and chest x-ray adequate for interpretation during COVID 19 epidemic. RADIOPHARMACEUTICALS:  4.3 mCi Tc-10m MAA IV COMPARISON:  Chest x-ray today FINDINGS: No perfusion defects to suggest pulmonary embolus. IMPRESSION: No evidence of pulmonary embolus. Electronically Signed   By: Rolm Baptise M.D.   On: 09/17/2021 20:06   VAS [place] LOWER EXTREMITY VENOUS (DVT) (ONLY [facility] & [facility])  Result Date: 09/17/2021  Lower Venous DVT Study Patient Name:  [facility]  Date of Exam:   09/17/2021 Medical Rec #: [id number]         Accession #:    [id number] Date of Birth: 1946-02-28         Patient Gender: M Patient Age:   61 years Exam Location:  [facility] Procedure:      VAS [place] LOWER EXTREMITY VENOUS (DVT) Referring Phys: Franchon Ketterman --------------------------------------------------------------------------------  Indications: Edema.  Comparison Study: No prior study  Performing Technologist: Maudry Mayhew MHA, RDMS, RVT, RDCS  Examination Guidelines: A complete evaluation includes B-mode imaging, spectral Doppler, color Doppler, and power Doppler as needed of all accessible portions of each vessel. Bilateral testing is considered an integral part of a complete examination. Limited examinations for reoccurring indications may be performed as noted. The reflux portion of the exam is performed with the patient in reverse Trendelenburg.  +---------+---------------+---------+-----------+----------+--------------+  RIGHT     Compressibility Phasicity Spontaneity Properties Thrombus Aging  +---------+---------------+---------+-----------+----------+--------------+  CFV       Full            Yes       Yes                                    +---------+---------------+---------+-----------+----------+--------------+  SFJ       Full                                                             +---------+---------------+---------+-----------+----------+--------------+  FV Prox   Full                                                             +---------+---------------+---------+-----------+----------+--------------+  FV Mid    Full                                                             +---------+---------------+---------+-----------+----------+--------------+  FV Distal Full                                                             +---------+---------------+---------+-----------+----------+--------------+  PFV       Full                                                             +---------+---------------+---------+-----------+----------+--------------+  POP       Full            Yes       Yes                                    +---------+---------------+---------+-----------+----------+--------------+  PTV       Full                                                             +---------+---------------+---------+-----------+----------+--------------+  PERO      Full                                                              +---------+---------------+---------+-----------+----------+--------------+   +---------+---------------+---------+-----------+----------+--------------+  LEFT      Compressibility Phasicity Spontaneity Properties Thrombus Aging  +---------+---------------+---------+-----------+----------+--------------+  CFV       Full            Yes       Yes                                    +---------+---------------+---------+-----------+----------+--------------+  SFJ       Full                                                             +---------+---------------+---------+-----------+----------+--------------+  FV Prox   Full                                                             +---------+---------------+---------+-----------+----------+--------------+  FV Mid    Full                                                             +---------+---------------+---------+-----------+----------+--------------+  FV Distal Full                                                             +---------+---------------+---------+-----------+----------+--------------+  PFV       Full                                                             +---------+---------------+---------+-----------+----------+--------------+  POP       Full            Yes       Yes                                    +---------+---------------+---------+-----------+----------+--------------+  PTV       Full                                                             +---------+---------------+---------+-----------+----------+--------------+  PERO      Full                                                             +---------+---------------+---------+-----------+----------+--------------+     Summary: RIGHT: - There is no evidence of deep vein thrombosis in the lower extremity.  - No cystic structure found in the popliteal fossa.  LEFT: - There is no evidence of deep vein thrombosis in the lower extremity.  -  No cystic structure found in the popliteal fossa.  *See table(s) above for measurements and observations. Electronically signed by Harold Barban MD on 09/17/2021 at 9:42:23 PM.    Final     Procedures .Critical Care Performed by: Nettie Elm, PA-C Authorized by: Nettie Elm, PA-C   Critical care provider statement:    Critical care time (minutes):  34   Critical care was necessary to treat or prevent imminent or life-threatening deterioration of the following conditions:  Cardiac failure   Critical care was time spent personally by me on the following activities:  Development of treatment plan with patient or surrogate, discussions with consultants, evaluation of patient's response to treatment, examination of patient, ordering and review of laboratory studies, ordering and review of radiographic studies, ordering and performing treatments and interventions, pulse oximetry, re-evaluation of patient's condition and review of  old charts    Medications Ordered in ED Medications  aspirin chewable tablet 81 mg (has no administration in time range)  insulin NPH Human (NOVOLIN N) injection 10 Units (has no administration in time range)  insulin aspart (novoLOG) injection 0-15 Units (has no administration in time range)  insulin aspart (novoLOG) injection 0-5 Units (has no administration in time range)  famotidine (PEPCID) tablet 20 mg (has no administration in time range)  pantoprazole (PROTONIX) EC tablet 80 mg (has no administration in time range)  ezetimibe (ZETIA) tablet 10 mg (has no administration in time range)  heparin bolus via infusion 4,000 Units (has no administration in time range)  heparin ADULT infusion 100 units/mL (25000 units/262mL) (has no administration in time range)  nitroGLYCERIN (NITROSTAT) SL tablet 0.4 mg (has no administration in time range)  acetaminophen (TYLENOL) tablet 650 mg (has no administration in time range)  ondansetron (ZOFRAN) injection 4 mg (has  no administration in time range)  predniSONE (DELTASONE) tablet 10 mg (has no administration in time range)  sodium chloride 0.9 % bolus 500 mL (0 mLs Intravenous Stopped 09/17/21 2014)  insulin aspart (novoLOG) injection 2 Units (2 Units Subcutaneous Given 09/17/21 1836)  albuterol (VENTOLIN HFA) 108 (90 Base) MCG/ACT inhaler 2 puff (2 puffs Inhalation Given 09/17/21 1834)  technetium albumin aggregated (MAA) injection solution 4.2 millicurie (4.2 millicuries Intravenous Contrast Given 09/17/21 1924)  sodium chloride 0.9 % bolus 500 mL (0 mLs Intravenous Stopped 09/17/21 2129)   ED Course/ Medical Decision Making/ A&P    Pleasant 76 year old here for evaluation of worsening chest pain, shortness of breath.  Unfortunately he has complex cardiac pulmonary history sounds like is been worsening since diagnosed with COVID about 4 weeks ago, subsequently BOOP.  Worsening pleuritic and exertional chest pain over the last few days.  Does have history of prior 5 vessel CABG.  Tachycardia at home as well as lower extremity swelling.  She has stable vital signs.  Heart and lungs clear.  Abdomen soft, nontender.  Does appear clinically fluid overloaded to his lower extremities.  Labs and imaging personally reviewed and interpreted:  CBC leukocytosis at 37.6 Metabolic panel with potassium 5.4, glucose 268, BUN 60, similar to prior, creatinine 2.34 appears to have acute on chronic renal failure, anion gap 18>> patient given insulin, IV fluids, albuterol for hyperkalemia. [place] neg for DVT Bl DG chest without acute abnormality  Lactic will give IVF and repeat Trop 250>195  CONSULT with Dr. [name] Bibles with [facility]. Rec medicine   CONSULT with Dr. Alcario Drought with [facility] who will evaluate patient for admission.  Heparin GTT started                           Medical Decision Making Amount and/or Complexity of Data Reviewed Independent Historian: parent External Data Reviewed: labs, radiology, ECG and notes. Labs:  ordered. Decision-making details documented in ED Course. Radiology: ordered and independent interpretation performed. Decision-making details documented in ED Course. ECG/medicine tests: ordered and independent interpretation performed. Decision-making details documented in ED Course.  Risk Drug therapy requiring intensive monitoring for toxicity. Decision regarding hospitalization.           Final Clinical Impression(s) / ED Diagnoses Final diagnoses:  Elevated troponin  Hyperkalemia  Hyperglycemia  Acute renal failure superimposed on chronic kidney disease, unspecified CKD stage, unspecified acute renal failure type (HCC)  ACS (acute coronary syndrome) ([place])    Rx / DC Orders ED Discharge Orders  None         Analese Sovine A, PA-C 09/17/21 2302    Charlesetta Shanks, MD 09/20/21 [phone]

## 2021-09-17 NOTE — ED Provider Notes (Signed)
I provided a substantive portion of the care of this patient.  I personally performed the entirety of the history for this encounter.  EKG Interpretation  Date/Time:  Tuesday September 17 2021 11:44:13 EST Ventricular Rate:  116 PR Interval:  140 QRS Duration: 128 QT Interval:  336 QTC Calculation: 467 R Axis:   -62 Text Interpretation: Sinus tachycardia Right bundle branch block Left anterior fascicular block Minimal voltage criteria for LVH, may be normal variant ( R in aVL ) Cannot rule out Inferior infarct (masked by fascicular block?) , age undetermined Anterior infarct , age undetermined Abnormal ECG No previous ECGs available agree. NO STEMI Confirmed by Charlesetta Shanks [phone]) on 09/17/2021 5:20:35 PM   Patient has multiple medical comorbidities.  He is chronically on prednisone for Boop but had a course of COVID about a month ago as well.  Patient reports he did get fairly intense chest pain 2 days ago, on Sunday.  He reports that it eased up but he still is having some pressure-like discomfort.  He reports he is felt more short of breath than baseline since that time.  He has not noticed significant swelling in his legs but just now recently started noticing some pain in his legs.  He reports it feels more like there just tired and heavy.  Patient is alert with clear mental status.  No respiratory distress at rest.  Heart is regular.  Lungs grossly clear to anterior auscultation.  Abdomen nontender.  1+ edema bilateral lower extremities pitting.  Agree with preceding with continued cardiac work-up, evaluation for PE.  Patient has acute on chronic renal failure not appropriate for PE study at this time.  We will proceed with V/Q and DVT studies.  I have counseled patient and wife on plan for admission and ongoing diagnostic evaluation   Charlesetta Shanks, MD 09/17/21 1728

## 2021-09-17 NOTE — Telephone Encounter (Signed)
Pt c/o Shortness Of Breath: STAT if SOB developed within the last 24 hours or pt is noticeably SOB on the phone  1. Are you currently SOB (can you hear that pt is SOB on the phone)? Yes audible   2. How long have you been experiencing SOB? Since covid 5 weeks ago   3. Are you SOB when sitting or when up moving around? Exertion but recovery requires about 30 minutes   4. Are you currently experiencing any other symptoms? Trouble catching breath HR elevated during SOBE   Notes pulse ox 122 HR and sat 98%

## 2021-09-17 NOTE — ED Triage Notes (Signed)
Pt. Stated, Kurt Aguilar been Dx in early Dec. With Boop, pneumonia, and COVID. The new episode started with breathing getting worse, inable to go to bathroom without the pulse going up and oxygen going down.

## 2021-09-17 NOTE — Telephone Encounter (Signed)
Spoke with patient and reviewed his concerns. He reports having shortness of breath and is concerned it is his heart. Patient states that he recently had covid and since then he has been having diabetes spiking, can't catch his breath, and shortness of breath with activity such as going to restroom. He reports oxygen levels are normal. Discussed these symptoms could be related to him having covid. He also states that he has BOOP. Recommended that he call his pulmonology provider and his primary care provider as these can be related to [place]. He states he saw pulmonary last week and they started him on prednisone. Reviewed chart and he has chronic reactive airway symptoms which is mentioned in his previous visits. He insists that he is concerned about his heart and feels this is different. Attempted to provide reassurance but he continued to persist about seeing provider. He requested to see Dr. [name]. Scheduled appointment with him and continued to encourage he reach out to PCP & Pulmonary as these symptoms could be related to his recent covid diagnosis.

## 2021-09-17 NOTE — Telephone Encounter (Signed)
[place] [facility] - Client TELEPHONE ADVICE RECORD [facility] Patient Name: Kurt Aguilar [facility] Gender: Male DOB: [birth year] Age: 76 Y 11 M 25 D Return Phone Number: [phone] (Secondary) Address: City/ State/ Zip: [place] [address] Client [facility] - Client Client Site [place] Provider Glori Bickers, Roque Lias - MD Contact Type Call Who Is Calling Patient / Member / Family / Caregiver Call Type Triage / Clinical Relationship To Patient Self Return Phone Number [phone] (Secondary) Chief Complaint BREATHING - shortness of breath or sounds breathless Reason for Call Symptomatic / Request for [place] states he is having trouble breathing, cannot catch his breath and has elevated blood sugar. No appointments today per office. Translation No Nurse Assessment Nurse: [name], RN, [name] Date/Time (Eastern Time): 09/17/2021 10:55:57 AM Confirm and document reason for call. If symptomatic, describe symptoms. ---caller states his blood sugar is elevated. started with covid. had been put on steroids blood sugar is getting high 323 this morning. was put on insulin last friday and not helping. Does the patient have any new or worsening symptoms? ---Yes Will a triage be completed? ---Yes Related visit to physician within the last 2 weeks? ---Yes Does the PT have any chronic conditions? (i.e. diabetes, asthma, this includes High risk factors for pregnancy, etc.) ---Yes List chronic conditions. ---type 2 DM, BOOP Is this a behavioral health or substance abuse call? ---No Guidelines Guideline Title Affirmed Question Affirmed Notes Nurse Date/Time (Eastern Time) Diabetes - High Blood Sugar [1] Blood glucose > 240 mg/dL (13.3 mmol/L) AND [2] rapid breathing [name], RN, [name] 09/17/2021 10:58:33 AM Disp. Time Eilene Ghazi Time) Disposition Final User 09/17/2021  10:54:19 AM Send to Urgent Queue Mockler, Tiffany PLEASE NOTE: All timestamps contained within this report are represented as [place] Standard Time. CONFIDENTIALTY NOTICE: This fax transmission is intended only for the addressee. It contains information that is legally privileged, confidential or otherwise protected from use or disclosure. If you are not the intended recipient, you are strictly prohibited from reviewing, disclosing, copying using or disseminating any of this information or taking any action in reliance on or regarding this information. If you have received this fax in error, please notify us immediately by telephone so that we can arrange for its return to [place]. Phone: [phone], Toll-Free: [phone], Fax: [phone] Page: 2 of 2 Call Id: [id number] 09/17/2021 11:01:58 AM Go to ED Now Yes [name], RN, Shelly Coss Disagree/Comply Comply [name] Understands Yes PreDisposition InappropriateToAsk Care Advice Given Per Guideline GO TO ED NOW: * You need to be seen in the Emergency Department. * Go to the ED at ___________ [place] now. Drive carefully. CARE ADVICE given per Diabetes - High Blood Sugar (Adult) guideline. * Patient should not delay going to the emergency department. Referrals [place]

## 2021-09-17 NOTE — ED Notes (Signed)
Pt given a sandwich bag with crackers and cheese.

## 2021-09-17 NOTE — Progress Notes (Signed)
ANTICOAGULATION CONSULT NOTE - Initial Consult  Pharmacy Consult for IV heparin Indication: chest pain/ACS  Allergies  Allergen Reactions   Oxycodone-Acetaminophen Other (See Comments)    Stops breathing    Patient Measurements:   Heparin Dosing Weight: 94.77 kg  Vital Signs: Temp: 98.2 F (36.8 C) (01/10 1558) Temp Source: Oral (01/10 1558) BP: 140/79 (01/10 2030) Pulse Rate: 71 (01/10 2030)  Labs: Recent Labs    09/17/21 1247 09/17/21 1731 09/17/21 1856  HGB 14.3  --  11.9*  HCT 44.0  --  35.0*  PLT 172  --   --   CREATININE 2.34*  --   --   TROPONINIHS 250* 195*  --     Estimated Creatinine Clearance: 31.9 mL/min (A) (by C-G formula based on SCr of 2.34 mg/dL (H)).   Medical History: Past Medical History:  Diagnosis Date   Arthritis    Chronic kidney disease    Complication of anesthesia    constipation and inability to  urinate happened a few days after cataract surgery    Diabetes ([place])    type 2    Diverticulitis    GERD (gastroesophageal reflux disease)    Gout    Heart attack ([place])    Heart disease    Heart murmur    Hyperglycemia    Hyperlipidemia    Hypertension    Osteoporosis    Pneumonia    hx of BOOP followed by Dr [name]    Sleep apnea    cpapp - setting at 17     Assessment: 14 YOM presenting to the ED with chest pain. No history of anticoagulation prior to admission. Pharmacy to dose IV heparin.   Hgb and platelets are stable and within normal limits. Renal function appears to be above baseline with Scr at 2.34 (BL ~1.8).   Goal of Therapy:  Heparin level 0.3-0.7 units/ml Monitor platelets by anticoagulation protocol: Yes   Plan:  IV heparin 4000 unit bolus x1 Start IV heparin 1200 units/h 8h heparin level Daily heparin level, CBC Monitor for signs/symptoms of bleeding  Thank you for involving pharmacy in this patient's care.  Elita Quick, PharmD PGY1 Ambulatory Care Pharmacy Resident 09/17/2021 10:05  PM  **Pharmacist phone directory can be found on [email or web address] listed under [place]**

## 2021-09-17 NOTE — ED Notes (Signed)
Pt called 2x for vital check.

## 2021-09-17 NOTE — ED Provider Triage Note (Signed)
Emergency Medicine Provider Triage Evaluation Note  KRISHAV MAMONE , a 76 y.o. male  was evaluated in triage.  Pt complains of sob, chest pain that started about 3 weeks ago. Hx BOOP. Has recently had COVID and been tx with azithromycin, prednisone and two rounds of antivirals   Review of Systems  Positive: Chest pain, sob Negative: Fevers, ble swelling  Physical Exam  BP (!) 143/84 (BP Location: Left Arm)    Pulse (!) 114    Temp 97.8 F (36.6 C) (Oral)    Resp 20    SpO2 100%  Gen:   Awake, no distress   Resp:  Normal effort  MSK:   Moves extremities without difficulty  Other:  Bibasilar rales, tachycardia  Medical Decision Making  Medically screening exam initiated at 12:34 PM.  Appropriate orders placed.  DREYTON ROESSNER was informed that the remainder of the evaluation will be completed by another provider, this initial triage assessment does not replace that evaluation, and the importance of remaining in the ED until their evaluation is complete.     Rodney Booze, [place] 09/17/21 1236

## 2021-09-17 NOTE — H&P (Signed)
History and Physical    MERT DIETRICK OVF:[id number] DOB: [birth year] DOA: 09/17/2021  PCP: [name], MD  Patient coming from: Home  I have personally briefly reviewed patient's old medical records in [place]  Chief Complaint: CP  HPI: Kurt Aguilar is a 76 y.o. male with medical history significant of CAD s/p CABG in 1997; DM2, HTN, HLD, BOOP, chronic steroid dependence.  Pt was having mild intermittent CP for past 1 month.  Pt had severe CP on Sunday, felt like prior MI, worse with exertion, associated SOB.  Pt was very animated watching football game at the time (yelling at TV).  He didn't say anything to his wife at the time.  Since that time he has had ongoing CP, with even minimal exertion.  DOE, progressively worsening peripheral edema.   ED Course: Trops down trending: 250 and 195  Mild AKI with creat 2.3 up from 1.8 baseline.  DVT/PE workup is negative.  Lactate 4.1, 3.4 on repeat.  Got 1L IVF total bolus in ED.  EKG as discussed below.   Past Medical History:  Diagnosis Date   Arthritis    Chronic kidney disease    Complication of anesthesia    constipation and inability to  urinate happened a few days after cataract surgery    Diabetes ([place])    type 2    Diverticulitis    GERD (gastroesophageal reflux disease)    Gout    Heart attack ([place])    Heart disease    Heart murmur    Hyperglycemia    Hyperlipidemia    Hypertension    Osteoporosis    Pneumonia    hx of BOOP followed by Dr [name]    Sleep apnea    cpapp - setting at 17     Past Surgical History:  Procedure Laterality Date   ACROMIO-CLAVICULAR JOINT REPAIR Left 05/05/2019   Procedure: SHOULDER ARTHROSCOPIC ASSISTED ACROMIO-CLAVICULAR JOINT REPAIR;  Surgeon: Tania Ade, MD;  Location: [facility];  Service: Orthopedics;  Laterality: Left;   CATARACT EXTRACTION Bilateral    CORONARY ARTERY BYPASS GRAFT     [place]   open heart surgery  09/09/1995-1998   5  bypass   REPAIR KNEE LIGAMENT     right   SHOULDER ARTHROSCOPY Left 05/05/2019   Procedure: ARTHROSCOPY SHOULDER;  Surgeon: Tania Ade, MD;  Location: [facility];  Service: Orthopedics;  Laterality: Left;   TONSILLECTOMY     TOTAL KNEE ARTHROPLASTY Left 05/29/2020   Procedure: LEFT TOTAL KNEE ARTHROPLASTY;  Surgeon: Melrose Nakayama, MD;  Location: [facility];  Service: Orthopedics;  Laterality: Left;     reports that he quit smoking about 16 years ago. His smoking use included cigars and cigarettes. He has a 3.00 pack-year smoking history. He has never used smokeless tobacco. He reports current alcohol use of about 1.0 standard drink per week. He reports that he does not use drugs.  Allergies  Allergen Reactions   Oxycodone-Acetaminophen Other (See Comments)    Stops breathing    Family History  Problem Relation Age of Onset   Heart disease Mother    Heart disease Father    Stroke Father    Heart attack Son 68   Kidney disease Neg Hx    Prostate cancer Neg Hx    Diabetes Neg Hx     Prior to Admission medications   Medication Sig Start Date End Date Taking? Authorizing Provider  allopurinol (ZYLOPRIM) 100 MG tablet Take 1 tablet (  100 mg total) by mouth 2 (two) times daily. Patient taking differently: Take 100 mg by mouth 2 (two) times daily. Patient states taking 80mg  daily 04/12/21  Yes Tower, [name], MD  aspirin 81 MG tablet Take 1 tablet (81 mg total) by mouth 2 (two) times daily after a meal. 05/29/20  Yes Nida, Mitzi Hansen, PA-C  benzonatate (TESSALON) 200 MG capsule Take 1 capsule (200 mg total) by mouth 3 (three) times daily as needed for cough. 08/26/21  Yes Young, [name], MD  Bioflavonoid Products (VITAMIN C) CHEW Chew 1 tablet by mouth daily.   Yes [provider]  Carboxymethylcellul-Glycerin (LUBRICATING EYE DROPS OP) Place 1 drop into both eyes daily as needed (dry eyes).   Yes [provider]  Cholecalciferol (VITAMIN D3 PO) Take 1 capsule by mouth daily.    Yes [provider]  CINNAMON PO Take 1,000 mg by mouth 2 (two) times daily.   Yes [provider]  colchicine 0.6 MG tablet TAKE 1 TABLET BY MOUTH TWICE A DAY WITH A MEAL AS NEEDED FOR ACUTE FLARE OF GOUT 03/16/18  Yes Tower, [name], MD  Dextromethorphan-guaiFENesin (MUCINEX DM MAXIMUM STRENGTH) 60-1200 MG TB12 Take 1 tablet by mouth 2 (two) times daily as needed (congestion).    Yes [provider]  ezetimibe (ZETIA) 10 MG tablet Take 1 tablet (10 mg total) by mouth daily. 04/12/21  Yes Tower, [name], MD  famotidine (PEPCID) 20 MG tablet TAKE 1 TABLET BY MOUTH EVERYDAY AT BEDTIME 08/21/21  Yes Tanda Rockers, MD  Glucosamine HCl (GLUCOSAMINE PO) Take 1,500 mg by mouth 2 (two) times daily.    Yes [provider]  HYDROcodone bit-homatropine (HYCODAN) 5-1.5 MG/5ML syrup Take 5 mLs by mouth every 6 (six) hours as needed for cough. 08/27/21  Yes Young, Tarri Fuller D, MD  insulin isophane & regular human KwikPen (NOVOLIN 70/30 KWIKPEN) (70-30) 100 UNIT/ML KwikPen Inject 20 Units into the skin daily with breakfast. And pen needles 1/day 09/13/21  Yes Renato Shin, MD  INVOKANA 100 MG TABS tablet TAKE 1 TABLET BY MOUTH EVERY OTHER DAY 09/10/21  Yes Renato Shin, MD  Lutein 20 MG CAPS Take 20 mg by mouth daily.    Yes [provider]  methocarbamol (ROBAXIN) 500 MG tablet Take 1 tablet (500 mg total) by mouth every 8 (eight) hours as needed for muscle spasms. Caution of sedation 05/29/20  Yes Loni Dolly, PA-C  metoprolol tartrate (LOPRESSOR) 25 MG tablet Take 0.5 tablets (12.5 mg total) by mouth daily. 04/17/20  Yes Gollan, Kathlene November, MD  Multiple Vitamins-Minerals (ZINC PO) Take 1 tablet by mouth daily.   Yes [provider]  omeprazole (PRILOSEC) 40 MG capsule TAKE 1 CAPUSLE BY MOUTH 30- 60 MIN BEFORE YOUR FIRST AND LAST MEALS OF THE DAY 09/13/21  Yes Tanda Rockers, MD  ondansetron (ZOFRAN) 8 MG tablet Take 1 tablet (8 mg total) by mouth every 8 (eight) hours  as needed for nausea or vomiting. From narcotic pain medicine 01/03/20  Yes Tower, [name], MD  predniSONE (DELTASONE) 5 MG tablet Take 20 mg by mouth daily with breakfast. 20 mg in the morning and 10 mg at night   Yes [provider]  Blood Glucose Monitoring Suppl (FREESTYLE LITE) DEVI 1 each by Does not apply route 4 (four) times daily. E11.65 01/16/20   Renato Shin, MD  glucose blood (FREESTYLE LITE) test strip 1 each by Other route 4 (four) times daily. E11.65 04/10/20   Loanne Drilling,  Hilliard Clark, MD  Lancets (FREESTYLE) lancets 1 EACH BY OTHER ROUTE 4 (FOUR) TIMES DAILY. E11.65 08/23/20   Renato Shin, MD    Physical Exam: Vitals:   09/17/21 1800 09/17/21 1815 09/17/21 1900 09/17/21 2030  BP: (!) 179/115 (!) 162/100 (!) 147/74 140/79  Pulse: 77 76 84 71  Resp: 18 19 14 11   Temp:      TempSrc:      SpO2: 100% 100% 97% 100%   Constitutional: NAD, calm, comfortable Respiratory: clear to auscultation bilaterally, no wheezing, no crackles. Normal respiratory effort. No accessory muscle use.  Cardiovascular: Regular rate and rhythm, no murmurs / rubs / gallops. 2+ BLE pitting edema. Neurologic: CN 2-12 grossly intact. Sensation intact, DTR normal. Strength 5/5 in all 4.  Psychiatric: Normal judgment and insight. Alert and oriented x 3. Normal mood.    Labs on Admission: I have personally reviewed following labs and imaging studies  CBC: Recent Labs  Lab 09/17/21 1247 09/17/21 1856  WBC 13.7*  --   HGB 14.3 11.9*  HCT 44.0 35.0*  MCV 100.5*  --   PLT 172  --    Basic Metabolic Panel: Recent Labs  Lab 09/17/21 1247 09/17/21 1856  NA 137 138  K 5.4* 4.6  CL 104  --   CO2 15*  --   GLUCOSE 268*  --   BUN 60*  --   CREATININE 2.34*  --   CALCIUM 9.6  --    GFR: Estimated Creatinine Clearance: 31.9 mL/min (A) (by C-G formula based on SCr of 2.34 mg/dL (H)). Liver Function Tests: No results for input(s): AST, ALT, ALKPHOS, BILITOT, PROT, ALBUMIN in the last 168 hours. No  results for input(s): LIPASE, AMYLASE in the last 168 hours. No results for input(s): AMMONIA in the last 168 hours. Coagulation Profile: No results for input(s): INR, PROTIME in the last 168 hours. Cardiac Enzymes: No results for input(s): CKTOTAL, CKMB, CKMBINDEX, TROPONINI in the last 168 hours. BNP (last 3 results) No results for input(s): PROBNP in the last 8760 hours. HbA1C: No results for input(s): HGBA1C in the last 72 hours. CBG: Recent Labs  Lab 09/17/21 2159  GLUCAP 133*   Lipid Profile: No results for input(s): CHOL, HDL, LDLCALC, TRIG, CHOLHDL, LDLDIRECT in the last 72 hours. Thyroid Function Tests: No results for input(s): TSH, T4TOTAL, FREET4, T3FREE, THYROIDAB in the last 72 hours. Anemia Panel: No results for input(s): VITAMINB12, FOLATE, FERRITIN, TIBC, IRON, RETICCTPCT in the last 72 hours. Urine analysis:    Component Value Date/Time   COLORURINE STRAW (A) 05/29/2020 1136   APPEARANCEUR CLEAR 05/29/2020 1136   LABSPEC 1.005 05/29/2020 1136   PHURINE 5.0 05/29/2020 1136   GLUCOSEU 150 (A) 05/29/2020 1136   GLUCOSEU NEGATIVE 11/25/2010 0951   HGBUR MODERATE (A) 05/29/2020 1136   BILIRUBINUR NEGATIVE 05/29/2020 1136   BILIRUBINUR neg. 08/30/2012 [place] 05/29/2020 1136   PROTEINUR NEGATIVE 05/29/2020 1136   UROBILINOGEN 0.2 08/30/2012 1548   UROBILINOGEN 0.2 11/25/2010 0951   NITRITE NEGATIVE 05/29/2020 1136   LEUKOCYTESUR NEGATIVE 05/29/2020 1136    Radiological Exams on Admission: DG Chest 1 View  Result Date: 09/17/2021 CLINICAL DATA:  Chest pressure and shortness of breath. EXAM: CHEST  1 VIEW COMPARISON:  Chest radiograph 08/26/2021, CT chest 12/19/2015 FINDINGS: Status post median sternotomy. Cardiac silhouette is again at the upper limits of normal size. Mild to moderate calcifications within the aortic arch. There are again calcified nodular densities within left lung apex, unchanged. These are  consistent with the calcified  pleural plaques seen on prior CT. No focal airspace opacity to indicate pneumonia. No pleural effusion or pneumothorax. No acute skeletal abnormality. IMPRESSION: No acute lung process.  Unchanged chronic findings as above. Electronically Signed   By: Yvonne Kendall   On: 09/17/2021 13:04   NM Pulmonary Perfusion  Result Date: 09/17/2021 CLINICAL DATA:  Pulmonary embolism (PE) suspected, high prob. Shortness of breath, chest pain EXAM: NUCLEAR MEDICINE PERFUSION LUNG SCAN TECHNIQUE: Perfusion images were obtained in multiple projections after intravenous injection of radiopharmaceutical. Ventilation scans intentionally deferred if perfusion scan and chest x-ray adequate for interpretation during COVID 19 epidemic. RADIOPHARMACEUTICALS:  4.3 mCi Tc-29m MAA IV COMPARISON:  Chest x-ray today FINDINGS: No perfusion defects to suggest pulmonary embolus. IMPRESSION: No evidence of pulmonary embolus. Electronically Signed   By: Rolm Baptise M.D.   On: 09/17/2021 20:06   VAS [place] LOWER EXTREMITY VENOUS (DVT) (ONLY [facility] & [facility])  Result Date: 09/17/2021  Lower Venous DVT Study Patient Name:  DREDYN GUBBELS [facility]  Date of Exam:   09/17/2021 Medical Rec #: [id number]         Accession #:    [id number] Date of Birth: 10/20/1945         Patient Gender: M Patient Age:   48 years Exam Location:  [facility] Procedure:      VAS [place] LOWER EXTREMITY VENOUS (DVT) Referring Phys: BRITNI HENDERLY --------------------------------------------------------------------------------  Indications: Edema.  Comparison Study: No prior study Performing Technologist: Maudry Mayhew MHA, RDMS, RVT, RDCS  Examination Guidelines: A complete evaluation includes B-mode imaging, spectral Doppler, color Doppler, and power Doppler as needed of all accessible portions of each vessel. Bilateral testing is considered an integral part of a complete examination. Limited examinations for reoccurring indications may be performed as noted. The reflux portion of  the exam is performed with the patient in reverse Trendelenburg.  +---------+---------------+---------+-----------+----------+--------------+  RIGHT     Compressibility Phasicity Spontaneity Properties Thrombus Aging  +---------+---------------+---------+-----------+----------+--------------+  CFV       Full            Yes       Yes                                    +---------+---------------+---------+-----------+----------+--------------+  SFJ       Full                                                             +---------+---------------+---------+-----------+----------+--------------+  FV Prox   Full                                                             +---------+---------------+---------+-----------+----------+--------------+  FV Mid    Full                                                             +---------+---------------+---------+-----------+----------+--------------+  FV Distal Full                                                             +---------+---------------+---------+-----------+----------+--------------+  PFV       Full                                                             +---------+---------------+---------+-----------+----------+--------------+  POP       Full            Yes       Yes                                    +---------+---------------+---------+-----------+----------+--------------+  PTV       Full                                                             +---------+---------------+---------+-----------+----------+--------------+  PERO      Full                                                             +---------+---------------+---------+-----------+----------+--------------+   +---------+---------------+---------+-----------+----------+--------------+  LEFT      Compressibility Phasicity Spontaneity Properties Thrombus Aging  +---------+---------------+---------+-----------+----------+--------------+  CFV       Full            Yes       Yes                                     +---------+---------------+---------+-----------+----------+--------------+  SFJ       Full                                                             +---------+---------------+---------+-----------+----------+--------------+  FV Prox   Full                                                             +---------+---------------+---------+-----------+----------+--------------+  FV Mid    Full                                                             +---------+---------------+---------+-----------+----------+--------------+  FV Distal Full                                                             +---------+---------------+---------+-----------+----------+--------------+  PFV       Full                                                             +---------+---------------+---------+-----------+----------+--------------+  POP       Full            Yes       Yes                                    +---------+---------------+---------+-----------+----------+--------------+  PTV       Full                                                             +---------+---------------+---------+-----------+----------+--------------+  PERO      Full                                                             +---------+---------------+---------+-----------+----------+--------------+     Summary: RIGHT: - There is no evidence of deep vein thrombosis in the lower extremity.  - No cystic structure found in the popliteal fossa.  LEFT: - There is no evidence of deep vein thrombosis in the lower extremity.  - No cystic structure found in the popliteal fossa.  *See table(s) above for measurements and observations. Electronically signed by Harold Barban MD on 09/17/2021 at 9:42:23 PM.    Final     EKG: Independently reviewed.  EKG shows RBBB (old), but now also shows pronounced Q waves in inferior leads that dont seem to be as present / prominent on prior EKGs).  Assessment/Plan Principal Problem:   Acute  coronary syndrome [facility]) Active Problems:   Essential hypertension   Right bundle branch block   CAD (coronary artery disease)   DM2 (diabetes mellitus, type 2) (HCC)   Steroid dependent (HCC)   AKI (acute kidney injury) ([place])   Stage 3b chronic kidney disease (CKD) ([place])    ACS - Concerned that pt may have new onset CHF / mild cardiogenic shock from coronary event (possibly even STEMI given the EKG changes) Trops down trending Still having active CP though mild at rest. Cont home ASA Takes crestor, not sure of dose, so ill put him on max dose 40mg  dailiy for the moment Heparin gtt 2d echo Tele monitor Cards consult HTN - Holding metoprolol in setting of ACS with possible cardiogenic shock until cards can evaluate. Lactic acidosis - 1L IVF in ED ? Cardiogenic shock AKI  on CKD 3b - Again, worried this might be cardiogenic Got IVF in ED Strict intake and output Repeat labs in AM Am concerned that this might not just be dehydration however as he has new onset peripheral edema (again raising suspicion for HF). DM2 - NPH 10u daily Mod scale SSI AC/HS for now Hold invokana Steroid dependant - Cont prednisone 10mg   DVT prophylaxis: Heparin GTT Code Status: Full Family Communication: Wife at bedside Disposition Plan: Home after ACS workup and treatment Consults called: Dr. [name] Bibles Admission status: Admit to inpatient  Severity of Illness: The appropriate patient status for this patient is INPATIENT. Inpatient status is judged to be reasonable and necessary in order to provide the required intensity of service to ensure the patient's safety. The patient's presenting symptoms, physical exam findings, and initial radiographic and laboratory data in the context of their chronic comorbidities is felt to place them at high risk for further clinical deterioration. Furthermore, it is not anticipated that the patient will be medically stable for discharge from the hospital within 2  midnights of admission.   * I certify that at the point of admission it is my clinical judgment that the patient will require inpatient hospital care spanning beyond 2 midnights from the point of admission due to high intensity of service, high risk for further deterioration and high frequency of surveillance required.*   Ciaran Begay M. DO [facility]  How to contact the [facility] Attending or Consulting provider [place] or covering provider during after hours [place], for this patient?  Check the care team in [facility] and look for a) attending/consulting [facility] provider listed and b) the [facility] team listed Log into [email or web address]  Amion Physician Scheduling and messaging for groups and whole hospitals  On call and physician scheduling software for group practices, residents, hospitalists and other medical providers for call, clinic, rotation and shift schedules. On[employer] is a hospital-wide system for scheduling doctors and paging doctors on call. EasyPlot is for scientific plotting and data analysis.  [email or web address]  and use 's universal password to access. If you do not have the password, please contact the hospital operator.  Locate the [facility] provider you are looking for under [facility] and page to a number that you can be directly reached. If you still have difficulty reaching the provider, please page the [facility] (Director on Call) for the Hospitalists listed on amion for assistance.  09/17/2021, 10:14 PM

## 2021-09-17 NOTE — Telephone Encounter (Signed)
Aware, will watch for correspondence  

## 2021-09-17 NOTE — Progress Notes (Signed)
Bilateral lower extremity venous duplex completed. Refer to "CV Proc" under chart review to view preliminary results.  09/17/2021 6:02 PM Kelby Aline., MHA, RVT, RDCS, RDMS

## 2021-09-18 ENCOUNTER — Other Ambulatory Visit: Payer: Self-pay

## 2021-09-18 ENCOUNTER — Inpatient Hospital Stay (HOSPITAL_COMMUNITY): Payer: PPO

## 2021-09-18 ENCOUNTER — Encounter (HOSPITAL_COMMUNITY): Payer: Self-pay | Admitting: Internal Medicine

## 2021-09-18 ENCOUNTER — Telehealth: Payer: Self-pay

## 2021-09-18 DIAGNOSIS — I214 Non-ST elevation (NSTEMI) myocardial infarction: Secondary | ICD-10-CM

## 2021-09-18 DIAGNOSIS — I249 Acute ischemic heart disease, unspecified: Secondary | ICD-10-CM

## 2021-09-18 LAB — BASIC METABOLIC PANEL
Anion gap: 10 (ref 5–15)
BUN: 50 mg/dL — ABNORMAL HIGH (ref 8–23)
CO2: 18 mmol/L — ABNORMAL LOW (ref 22–32)
Calcium: 8.9 mg/dL (ref 8.9–10.3)
Chloride: 108 mmol/L (ref 98–111)
Creatinine, Ser: 1.95 mg/dL — ABNORMAL HIGH (ref 0.61–1.24)
GFR, Estimated: 35 mL/min — ABNORMAL LOW (ref >60)
Glucose, Bld: 165 mg/dL — ABNORMAL HIGH (ref 70–99)
Potassium: 4.6 mmol/L (ref 3.5–5.1)
Sodium: 136 mmol/L (ref 135–145)

## 2021-09-18 LAB — ECHOCARDIOGRAM COMPLETE
AR max vel: 2.51 cm2
AV Area VTI: 2.47 cm2
AV Area mean vel: 2.75 cm2
AV Mean grad: 3 mmHg
AV Peak grad: 4.8 mmHg
Ao pk vel: 1.1 m/s
Area-P 1/2: 2.05 cm2
Height: 70 in
Weight: 3604.96 oz

## 2021-09-18 LAB — URINALYSIS, COMPLETE (UACMP) WITH MICROSCOPIC
Bacteria, UA: NONE SEEN
Bilirubin Urine: NEGATIVE
Glucose, UA: >=500 mg/dL — AB
Hgb urine dipstick: NEGATIVE
Ketones, ur: NEGATIVE mg/dL
Leukocytes,Ua: NEGATIVE
Nitrite: NEGATIVE
Protein, ur: NEGATIVE mg/dL
Specific Gravity, Urine: 1.02 (ref 1.005–1.030)
pH: 5 (ref 5.0–8.0)

## 2021-09-18 LAB — GLUCOSE, CAPILLARY
Glucose-Capillary: 178 mg/dL — ABNORMAL HIGH (ref 70–99)
Glucose-Capillary: 179 mg/dL — ABNORMAL HIGH (ref 70–99)
Glucose-Capillary: 144 mg/dL — ABNORMAL HIGH (ref 70–99)
Glucose-Capillary: 253 mg/dL — ABNORMAL HIGH (ref 70–99)

## 2021-09-18 LAB — LACTIC ACID, PLASMA
Lactic Acid, Venous: 2.7 mmol/L (ref 0.5–1.9)
Lactic Acid, Venous: 3.3 mmol/L (ref 0.5–1.9)
Lactic Acid, Venous: 2.1 mmol/L (ref 0.5–1.9)

## 2021-09-18 LAB — MRSA NEXT GEN BY PCR, NASAL: MRSA by PCR Next Gen: NOT DETECTED

## 2021-09-18 LAB — HEPATIC FUNCTION PANEL
ALT: 36 U/L (ref 0–44)
AST: 25 U/L (ref 15–41)
Albumin: 3 g/dL — ABNORMAL LOW (ref 3.5–5.0)
Alkaline Phosphatase: 33 U/L — ABNORMAL LOW (ref 38–126)
Bilirubin, Direct: 0.2 mg/dL (ref 0.0–0.2)
Indirect Bilirubin: 0.6 mg/dL (ref 0.3–0.9)
Total Bilirubin: 0.8 mg/dL (ref 0.3–1.2)
Total Protein: 5.3 g/dL — ABNORMAL LOW (ref 6.5–8.1)

## 2021-09-18 LAB — CBC
HCT: 38.4 % — ABNORMAL LOW (ref 39.0–52.0)
Hemoglobin: 13.2 g/dL (ref 13.0–17.0)
MCH: 33.2 pg (ref 26.0–34.0)
MCHC: 34.4 g/dL (ref 30.0–36.0)
MCV: 96.5 fL (ref 80.0–100.0)
Platelets: 135 10*3/uL — ABNORMAL LOW (ref 150–400)
RBC: 3.98 MIL/uL — ABNORMAL LOW (ref 4.22–5.81)
RDW: 13.6 % (ref 11.5–15.5)
WBC: 11.4 10*3/uL — ABNORMAL HIGH (ref 4.0–10.5)
nRBC: 0.2 % (ref 0.0–0.2)

## 2021-09-18 LAB — PROCALCITONIN: Procalcitonin: <0.1 ng/mL

## 2021-09-18 LAB — HEPARIN LEVEL (UNFRACTIONATED)
Heparin Unfractionated: >1.1 IU/mL — ABNORMAL HIGH (ref 0.30–0.70)
Heparin Unfractionated: >1.1 IU/mL — ABNORMAL HIGH (ref 0.30–0.70)
Heparin Unfractionated: >1.1 IU/mL — ABNORMAL HIGH (ref 0.30–0.70)

## 2021-09-18 LAB — TROPONIN I (HIGH SENSITIVITY): Troponin I (High Sensitivity): 250 ng/L (ref <18)

## 2021-09-18 MED ORDER — HEPARIN (PORCINE) 25000 UT/250ML-% IV SOLN
900.0000 [IU]/h | INTRAVENOUS | Status: DC
  Administered 2021-09-18: 900 [IU]/h via INTRAVENOUS
  Filled 2021-09-18: qty 250

## 2021-09-18 MED ORDER — SODIUM CHLORIDE 0.9 % WEIGHT BASED INFUSION
1.0000 mL/kg/h | INTRAVENOUS | Status: DC
  Administered 2021-09-19: 1 mL/kg/h via INTRAVENOUS

## 2021-09-18 MED ORDER — ASPIRIN 81 MG PO CHEW: 81.0000 mg | CHEWABLE_TABLET | ORAL | Status: DC

## 2021-09-18 MED ORDER — SODIUM CHLORIDE 0.9 % WEIGHT BASED INFUSION
3.0000 mL/kg/h | INTRAVENOUS | Status: DC
  Administered 2021-09-19: 3 mL/kg/h via INTRAVENOUS

## 2021-09-18 MED ORDER — HEPARIN (PORCINE) 25000 UT/250ML-% IV SOLN: 650.0000 [IU]/h | INTRAVENOUS | Status: DC

## 2021-09-18 MED ORDER — ASPIRIN 81 MG PO CHEW
81.0000 mg | CHEWABLE_TABLET | ORAL | Status: AC
  Administered 2021-09-19: 81 mg via ORAL
  Filled 2021-09-18: qty 1

## 2021-09-18 MED ORDER — SODIUM CHLORIDE 0.9% FLUSH
3.0000 mL | Freq: Two times a day (BID) | INTRAVENOUS | Status: DC
  Administered 2021-09-18 – 2021-09-25 (×5): 3 mL via INTRAVENOUS

## 2021-09-18 MED ORDER — SODIUM CHLORIDE 0.9 % IV SOLN: 250.0000 mL | INTRAVENOUS | Status: DC | PRN

## 2021-09-18 MED ORDER — SODIUM CHLORIDE 0.9% FLUSH: 3.0000 mL | INTRAVENOUS | Status: DC | PRN

## 2021-09-18 MED ORDER — PERFLUTREN LIPID MICROSPHERE
1.0000 mL | INTRAVENOUS | Status: AC | PRN
  Administered 2021-09-18: 4 mL via INTRAVENOUS
  Filled 2021-09-18: qty 10

## 2021-09-18 MED ORDER — SODIUM CHLORIDE 0.9 % IV SOLN: INTRAVENOUS | Status: DC

## 2021-09-18 MED ORDER — INSULIN NPH (HUMAN) (ISOPHANE) 100 UNIT/ML ~~LOC~~ SUSP
15.0000 [IU] | Freq: Every day | SUBCUTANEOUS | Status: DC
  Administered 2021-09-20 – 2021-09-23 (×4): 15 [IU] via SUBCUTANEOUS

## 2021-09-18 MED ORDER — SODIUM CHLORIDE 0.9% FLUSH
3.0000 mL | Freq: Two times a day (BID) | INTRAVENOUS | Status: DC
  Administered 2021-09-22 – 2021-09-25 (×4): 3 mL via INTRAVENOUS

## 2021-09-18 MED ORDER — ASPIRIN 81 MG PO CHEW: 81.0000 mg | CHEWABLE_TABLET | Freq: Two times a day (BID) | ORAL | Status: DC

## 2021-09-18 NOTE — Consult Note (Signed)
Cardiology Consultation:   Patient ID: Kurt Aguilar MRN: [id number]; DOB: November 10, 1945  Admit date: 09/17/2021 Date of Consult: 09/18/2021  PCP:  [name], MD   [facility] Providers Cardiologist:  Dr. [name]  Patient Profile:   Kurt Aguilar is a 76 y.o. male with a hx of CAD s/p 5v CABG, type 2 diabetes mellitus, CKD III, BOOP, essential hypertension, gout, and GERD who is being seen 09/18/2021 for the evaluation of chest pain at the request of Dr. Alcario Drought.  History of Present Illness:   Kurt Aguilar was diagnosed with coronary artery disease back in 1997 and underwent 5 vessel CABG back in [place].  Cardiovascular standpoint he has done quite well in the interim.  He has struggled with his diabetic control which has been compounded by his steroid requirement for his lung disease.  Functionally, Kurt Aguilar did quite well after CABG.  He has never seen to have any heart failure symptomatology.  He has been chest pain-free until recently.  He did have COVID last month and this did seem to flare his underlying lung disease.  He has been taking 20 mg of prednisone and this did initially seem to help, although he has had a progressive, mostly dry, cough over the last couple of weeks.  About 2 weeks ago he thought he was having "severe reflux" shortly after a meal.  He describes about 30 minutes of intense central chest discomfort that eventually resolved on its own.  He had 3 or 4 more similar episodes over the last couple of weeks which he does notice quite different from his normal reflux discomfort.  With this he has been coughing a lot more and has been quite fatigued.  Couple of days ago he had significant chest discomfort that also did not seem to get worse with exertion.  The last 2 days his shortness of breath has been quite progressive to the point that yesterday morning he could not even walk to the bathroom without becoming winded.  He has been concerned the symptoms are  related to his heart presented to the emergency department for evaluation.  On arrival to the emergency department he was tachycardic but otherwise stable.  His labs were notable for a mild AKI.  His initial troponin was elevated at 250 and trended down to 195.  His lactate was elevated at 4.1 with a white blood cell count of 13.7.  There was some concern that he had more prominent Q waves inferiorly and given his chest pain history we were consulted for further evaluation  Past Medical History:  Diagnosis Date   Arthritis    Chronic kidney disease    Complication of anesthesia    constipation and inability to  urinate happened a few days after cataract surgery    Diabetes ([place])    type 2    Diverticulitis    GERD (gastroesophageal reflux disease)    Gout    Heart attack ([place])    Heart disease    Heart murmur    Hyperglycemia    Hyperlipidemia    Hypertension    Osteoporosis    Pneumonia    hx of BOOP followed by Dr [name]    Sleep apnea    cpapp - setting at 15     Past Surgical History:  Procedure Laterality Date   ACROMIO-CLAVICULAR JOINT REPAIR Left 05/05/2019   Procedure: SHOULDER ARTHROSCOPIC ASSISTED ACROMIO-CLAVICULAR JOINT REPAIR;  Surgeon: Tania Ade, MD;  Location: [facility];  Service: Orthopedics;  Laterality: Left;   CATARACT EXTRACTION Bilateral    CORONARY ARTERY BYPASS GRAFT     [place]   open heart surgery  09/09/1995-1998   5 bypass   REPAIR KNEE LIGAMENT     right   SHOULDER ARTHROSCOPY Left 05/05/2019   Procedure: ARTHROSCOPY SHOULDER;  Surgeon: Tania Ade, MD;  Location: [facility];  Service: Orthopedics;  Laterality: Left;   TONSILLECTOMY     TOTAL KNEE ARTHROPLASTY Left 05/29/2020   Procedure: LEFT TOTAL KNEE ARTHROPLASTY;  Surgeon: Melrose Nakayama, MD;  Location: [facility];  Service: Orthopedics;  Laterality: Left;       Inpatient Medications: Scheduled Meds:  aspirin  81 mg Oral BID PC   ezetimibe  10 mg Oral Daily   famotidine  20 mg  Oral QHS   insulin aspart  0-15 Units Subcutaneous TID WC   insulin aspart  0-5 Units Subcutaneous QHS   insulin NPH Human  10 Units Subcutaneous QAC breakfast   pantoprazole  80 mg Oral BID AC   predniSONE  10 mg Oral BID WC   rosuvastatin  40 mg Oral Daily   Continuous Infusions:  heparin 1,200 Units/hr (09/17/21 2330)   PRN Meds: acetaminophen, nitroGLYCERIN, ondansetron (ZOFRAN) IV  Allergies:    Allergies  Allergen Reactions   Oxycodone-Acetaminophen Other (See Comments)    Stops breathing; has tolerated Tylenol before    Social History:   Social History   Socioeconomic History   Marital status: Married    Spouse name: Not on file   Number of children: 4   Years of education: 14   Highest education level: Not on file  Occupational History   Occupation: Armed forces technical officer: RETIRED  Tobacco Use   Smoking status: Former    Packs/day: 1.00    Years: 3.00    Pack years: 3.00    Types: Cigars, Cigarettes    Quit date: 11/25/2004    Years since quitting: 16.8   Smokeless tobacco: Never   Tobacco comments:    occas. cigar quit 2006  Vaping Use   Vaping Use: Never used  Substance and Sexual Activity   Alcohol use: Yes    Alcohol/week: 1.0 standard drink    Types: 1 Standard drinks or equivalent per week    Comment: rare   Drug use: No   Sexual activity: Never  Other Topics Concern   Not on file  Social History Narrative   Not on file   Social Determinants of Health   Financial Resource Strain: Low Risk    Difficulty of Paying Living Expenses: Not hard at all  Food Insecurity: No Food Insecurity   Worried About Charity fundraiser in the Last Year: Never true   [place] in the Last Year: Never true  Transportation Needs: No Transportation Needs   Lack of Transportation (Medical): No   Lack of Transportation (Non-Medical): No  Physical Activity: Sufficiently Active   Days of Exercise per Week: 4 days   Minutes of Exercise per Session: 50  min  Stress: No Stress Concern Present   Feeling of Stress : Not at all  Social Connections: Not on file  Intimate Partner Violence: Not At Risk   Fear of Current or Ex-Partner: No   Emotionally Abused: No   Physically Abused: No   Sexually Abused: No    Family History:    Family History  Problem Relation Age of Onset   Heart disease Mother    Heart  disease Father    Stroke Father    Heart attack Son 41   Kidney disease Neg Hx    Prostate cancer Neg Hx    Diabetes Neg Hx      ROS:  Please see the history of present illness.   All other ROS reviewed and negative.     Physical Exam/Data:   Vitals:   09/17/21 2300 09/17/21 2330 09/18/21 0200 09/18/21 0304  BP: 132/74 139/75 117/81 132/82  Pulse: 98 92 85 79  Resp: 16 14 (!) 26 15  Temp:    98 F (36.7 C)  TempSrc:    Oral  SpO2: 99% 98% 96% 96%  Weight:    102.2 kg  Height:    5\' 10"  (1.778 m)    Intake/Output Summary (Last 24 hours) at 09/18/2021 0415 Last data filed at 09/18/2021 0400 Gross per 24 hour  Intake 620 ml  Output 850 ml  Net -230 ml   Last 3 Weights 09/18/2021 09/13/2021 08/26/2021  Weight (lbs) 225 lb 5 oz 228 lb 235 lb 6.4 oz  Weight (kg) 102.2 kg 103.42 kg 106.777 kg     Body mass index is 32.33 kg/m.  General:  Well nourished, well developed, in no acute distress HEENT: normal Neck: Neck veins 1 cm above the clavicle at ~45 degrees  Vascular: No carotid bruits; Distal pulses 2+ bilaterally Cardiac:  normal S1, S2; RRR; no murmur  Lungs:  Basilar crackles, no wheezing, rhonchi or rales  Abd: soft, nontender, no hepatomegaly  Ext: Trace edema Musculoskeletal:  No deformities, BUE and BLE strength normal and equal Skin: warm and dry  Neuro:  CNs 2-12 intact, no focal abnormalities noted Psych:  Normal affect   EKG:  The EKG was personally reviewed and demonstrates:  sinus, RBBB, LVH   Relevant CV Studies: TTE (05/2009):   1. Left ventricle: The cavity size was normal. Wall thickness was       normal. Systolic function was normal. The estimated ejection      fraction was in the range of 55% to 60%.   2. Aortic valve: Mild regurgitation. Valve area: 2.8cm^2 (Vmax).   3. Mitral valve: Calcified annulus.   4. Pulmonary arteries: Systolic pressure was mildly increased.   Impressions:    Laboratory Data:  High Sensitivity Troponin:   Recent Labs  Lab 09/17/21 1247 09/17/21 1731  TROPONINIHS 250* 195*     Chemistry Recent Labs  Lab 09/17/21 1247 09/17/21 1856  NA 137 138  K 5.4* 4.6  CL 104  --   CO2 15*  --   GLUCOSE 268*  --   BUN 60*  --   CREATININE 2.34*  --   CALCIUM 9.6  --   GFRNONAA 28*  --   ANIONGAP 18*  --     Recent Labs  Lab 09/17/21 2323  PROT 5.3*  ALBUMIN 3.0*  AST 25  ALT 36  ALKPHOS 33*  BILITOT 0.8   Lipids No results for input(s): CHOL, TRIG, HDL, LABVLDL, LDLCALC, CHOLHDL in the last 168 hours.  Hematology Recent Labs  Lab 09/17/21 1247 09/17/21 1856  WBC 13.7*  --   RBC 4.38  --   HGB 14.3 11.9*  HCT 44.0 35.0*  MCV 100.5*  --   MCH 32.6  --   MCHC 32.5  --   RDW 13.7  --   PLT 172  --    Thyroid No results for input(s): TSH, FREET4 in the last 168 hours.  BNP  Recent Labs  Lab 09/17/21 1731  BNP 37.7    DDimer  Recent Labs  Lab 09/17/21 1731  DDIMER 0.34     Radiology/Studies:  DG Chest 1 View  Result Date: 09/17/2021 CLINICAL DATA:  Chest pressure and shortness of breath. EXAM: CHEST  1 VIEW COMPARISON:  Chest radiograph 08/26/2021, CT chest 12/19/2015 FINDINGS: Status post median sternotomy. Cardiac silhouette is again at the upper limits of normal size. Mild to moderate calcifications within the aortic arch. There are again calcified nodular densities within left lung apex, unchanged. These are consistent with the calcified pleural plaques seen on prior CT. No focal airspace opacity to indicate pneumonia. No pleural effusion or pneumothorax. No acute skeletal abnormality. IMPRESSION: No acute lung  process.  Unchanged chronic findings as above. Electronically Signed   By: Yvonne Kendall   On: 09/17/2021 13:04   NM Pulmonary Perfusion  Result Date: 09/17/2021 CLINICAL DATA:  Pulmonary embolism (PE) suspected, high prob. Shortness of breath, chest pain EXAM: NUCLEAR MEDICINE PERFUSION LUNG SCAN TECHNIQUE: Perfusion images were obtained in multiple projections after intravenous injection of radiopharmaceutical. Ventilation scans intentionally deferred if perfusion scan and chest x-ray adequate for interpretation during COVID 19 epidemic. RADIOPHARMACEUTICALS:  4.3 mCi Tc-46m MAA IV COMPARISON:  Chest x-ray today FINDINGS: No perfusion defects to suggest pulmonary embolus. IMPRESSION: No evidence of pulmonary embolus. Electronically Signed   By: Rolm Baptise M.D.   On: 09/17/2021 20:06   VAS [place] LOWER EXTREMITY VENOUS (DVT) (ONLY [facility])  Result Date: 09/17/2021  Lower Venous DVT Study Patient Name:  HOLLY PRING [facility]  Date of Exam:   09/17/2021 Medical Rec #: [id number]         Accession #:    [id number] Date of Birth: 09-12-1945         Patient Gender: M Patient Age:   27 years Exam Location[facility]  [facility] Procedure:      VAS [place] LOWER EXTREMITY VENOUS (DVT) Referring Phys: BRITNI HENDERLY --------------------------------------------------------------------------------  Indications: Edema.  Comparison Study: No prior study Performing Technologist: Maudry Mayhew MHA, RDMS, RVT, RDCS  Examination Guidelines: A complete evaluation includes B-mode imaging, spectral Doppler, color Doppler, and power Doppler as needed of all accessible portions of each vessel. Bilateral testing is considered an integral part of a complete examination. Limited examinations for reoccurring indications may be performed as noted. The reflux portion of the exam is performed with the patient in reverse Trendelenburg.  +---------+---------------+---------+-----------+----------+--------------+  RIGHT      Compressibility Phasicity Spontaneity Properties Thrombus Aging  +---------+---------------+---------+-----------+----------+--------------+  CFV       Full            Yes       Yes                                    +---------+---------------+---------+-----------+----------+--------------+  SFJ       Full                                                             +---------+---------------+---------+-----------+----------+--------------+  FV Prox   Full                                                             +---------+---------------+---------+-----------+----------+--------------+  FV Mid    Full                                                             +---------+---------------+---------+-----------+----------+--------------+  FV Distal Full                                                             +---------+---------------+---------+-----------+----------+--------------+  PFV       Full                                                             +---------+---------------+---------+-----------+----------+--------------+  POP       Full            Yes       Yes                                    +---------+---------------+---------+-----------+----------+--------------+  PTV       Full                                                             +---------+---------------+---------+-----------+----------+--------------+  PERO      Full                                                             +---------+---------------+---------+-----------+----------+--------------+   +---------+---------------+---------+-----------+----------+--------------+  LEFT      Compressibility Phasicity Spontaneity Properties Thrombus Aging  +---------+---------------+---------+-----------+----------+--------------+  CFV       Full            Yes       Yes                                    +---------+---------------+---------+-----------+----------+--------------+  SFJ       Full                                                              +---------+---------------+---------+-----------+----------+--------------+  FV Prox   Full                                                             +---------+---------------+---------+-----------+----------+--------------+  FV Mid    Full                                                             +---------+---------------+---------+-----------+----------+--------------+  FV Distal Full                                                             +---------+---------------+---------+-----------+----------+--------------+  PFV       Full                                                             +---------+---------------+---------+-----------+----------+--------------+  POP       Full            Yes       Yes                                    +---------+---------------+---------+-----------+----------+--------------+  PTV       Full                                                             +---------+---------------+---------+-----------+----------+--------------+  PERO      Full                                                             +---------+---------------+---------+-----------+----------+--------------+     Summary: RIGHT: - There is no evidence of deep vein thrombosis in the lower extremity.  - No cystic structure found in the popliteal fossa.  LEFT: - There is no evidence of deep vein thrombosis in the lower extremity.  - No cystic structure found in the popliteal fossa.  *See table(s) above for measurements and observations. Electronically signed by Harold Barban MD on 09/17/2021 at 9:42:23 PM.    Final      Assessment and Plan:   Kurt Aguilar has unfortunately really been struggling since contracting COVID.  Shortness of breath and cough were initially predominant but did seem to improve a bit with steroids.  Over the last couple weeks his cough has become progressively worse and he has now developed recurrent chest discomfort.  There are some typical features to his  description, and most notably he has been essentially chest pain-free since his CABG 25 years ago.  He has understandably been quite concerned that this could be related to coronary disease.  I am not sure there is 1 unifying diagnosis to explain all of Mr. Kister's symptoms and laboratory abnormalities.  His lactate is elevated at although he has been hypertensive at times and there are no physical exam findings to suggest he has an low output heart failure.  I am concerned about the possibility of aggressive coronary disease given his symptoms and 78 year old grafts.  Chronic ischemia may also be contributing to new LV dysfunction which may explain his worsening cough.  His BNP is normal he is obese.  I think we need to be sure there is no infectious source to explain his cough, elevated white blood cell count (admittedly he is on 20 mg of prednisone), and elevated lactate.  Did report feeling relatively dehydrated on admission but does not appear to have been adequately resuscitated.  His troponin was slightly elevated in the setting of CKD but did significantly downtrend.  Given this I do think it is reasonable to treat empirically for an NSTEMI with a heparin drip.  I would like to start with an echocardiogram.  Certainly if there is any new LV dysfunction or regional wall motion abnormalities would be obliged to perform diagnostic angiography.  I would like to see his renal function normalized before any invasive strategy was pursued.  Alternatively a nuclear stress test could be considered but again I would favor an echocardiogram and stabilization of his endorgan function first.  Recommendations: - TTE in the AM  - Agree with heparin ggt - Cont ASA + crestor + zetia - Optimize glycemic control  - Would favor urine/blood cultures  Risk Assessment/Risk Scores:   HEAR Score (for undifferentiated chest pain):  HEAR Score: 6{   For questions or updates, please contact [place] Please  consult [email or web address] for contact info under    Signed, Ronaldo Miyamoto, MD  09/18/2021 4:15 AM

## 2021-09-18 NOTE — Progress Notes (Incomplete)
°  Echocardiogram 2D Echocardiogram has been performed.  Kurt Aguilar 09/18/2021, 10:18 AM

## 2021-09-18 NOTE — Significant Event (Addendum)
Cards wants infectious work up added on, will order: 1) BCx x2 2) procalcitonin 3) UA 4) repeat lactic acid

## 2021-09-18 NOTE — Telephone Encounter (Signed)
Patient is calling wanting to call back.   Patient is currently in [facility] and would like to talk to Dr. [name] or his nurse.

## 2021-09-18 NOTE — Progress Notes (Signed)
PROGRESS NOTE    Kurt Aguilar  EHM:[id number] DOB: [birth year] DOA: 09/17/2021 PCP: [name], MD   Brief Narrative:  HPI: Kurt Aguilar is a 76 y.o. male with medical history significant of CAD s/p CABG in 1997; DM2, HTN, HLD, BOOP, chronic steroid dependence.   Pt was having mild intermittent CP for past 1 month.   Pt had severe CP on Sunday, felt like prior MI, worse with exertion, associated SOB.  Pt was very animated watching football game at the time (yelling at TV).  He didn't say anything to his wife at the time.   Since that time he has had ongoing CP, with even minimal exertion.  DOE, progressively worsening peripheral edema.   ED Course: Trops down trending: 250 and 19 Mild AKI with creat 2.3 up from 1.8 baseline. DVT/PE workup is negative. Lactate 4.1, 3.4 on repeat. Got 1L IVF total bolus in ED. EKG as discussed below.  Assessment & Plan:   Principal Problem:   Acute coronary syndrome (HCC) Active Problems:   Essential hypertension   Right bundle branch block   CAD (coronary artery disease)   DM2 (diabetes mellitus, type 2) (HCC)   Steroid dependent (HCC)   AKI (acute kidney injury) ([place])   Stage 3b chronic kidney disease (CKD) (HCC)  Chest pain/elevated troponin: Elevated troponin likely secondary to demand ischemia, no ischemic changes on EKG.  Troponin elevated but flat.  Echo with no wall motion rumbly normal ejection fraction.  Cardiology on board.  He is on heparin.  Plan for cardiac cath today since patient is high risk and he has not had any work-up done since he had CABG many years ago.  Currently he is chest pain-free.  Hypertension: Controlled.  Beta-blocker on hold.  Lactic acidosis with metabolic acidosis: Source unknown, improving.  Continue IV fluids.  Recheck later today.  AKI on CKD stage IIIb: Baseline creatinine around 1.9.  Presented with 2.84 which improved and now back to baseline.  Hyperkalemia: Resolved.  Type 2 diabetes  mellitus: Slightly elevated.  Will increase NPH to 15 units and continue SSI.  HLD: Continue Crestor.  DVT prophylaxis:    Code Status: Full Code  Family Communication:  Wife present at bedside.  Plan of care discussed with patient in length and he/she verbalized understanding and agreed with it.  Status is: Inpatient  Remains inpatient appropriate because: Needs cardiac cath.  Estimated body mass index is 32.33 kg/m as calculated from the following:   Height as of this encounter: 5\' 10"  (1.778 m).   Weight as of this encounter: 102.2 kg.    Nutritional Assessment: Body mass index is 32.33 kg/m.Marland Kitchen Seen by dietician.  I agree with the assessment and plan as outlined below: Nutrition Status:        . Skin Assessment: I have examined the patient's skin and I agree with the wound assessment as performed by the wound care RN as outlined below:    Consultants:  Cardiology  Procedures:  None  Antimicrobials:  Anti-infectives (From admission, onward)    None         Subjective: Seen and examined.  No chest pain or shortness of breath.  Wife is bedside.  Objective: Vitals:   09/18/21 0304 09/18/21 0719 09/18/21 1105 09/18/21 1116  BP: 132/82 (!) 165/90 (!) 155/88   Pulse: 79 91 63   Resp: 15 (!) 23 17   Temp: 98 F (36.7 C) 98 F (36.7 C) (P) 98 F (36.7 C)  TempSrc: Oral Oral (P) Oral   SpO2: 96% 99% 98% 97%  Weight: 102.2 kg     Height: 5\' 10"  (1.778 m)       Intake/Output Summary (Last 24 hours) at 09/18/2021 1244 Last data filed at 09/18/2021 1206 Gross per 24 hour  Intake 954.17 ml  Output 2025 ml  Net -1070.83 ml   Filed Weights   09/18/21 0304  Weight: 102.2 kg    Examination:  General exam: Appears calm and comfortable  Respiratory system: Clear to auscultation. Respiratory effort normal. Cardiovascular system: S1 & S2 heard, RRR. No JVD, murmurs, rubs, gallops or clicks.  +1 pitting edema bilateral lower extremity. Gastrointestinal  system: Abdomen is nondistended, soft and nontender. No organomegaly or masses felt. Normal bowel sounds heard. Central nervous system: Alert and oriented. No focal neurological deficits. Extremities: Symmetric 5 x 5 power. Skin: No rashes, lesions or ulcers Psychiatry: Judgement and insight appear normal. Mood & affect appropriate.    Data Reviewed: I have personally reviewed following labs and imaging studies  CBC: Recent Labs  Lab 09/17/21 1247 09/17/21 1856 09/18/21 0606  WBC 13.7*  --  11.4*  HGB 14.3 11.9* 13.2  HCT 44.0 35.0* 38.4*  MCV 100.5*  --  96.5  PLT 172  --  354*   Basic Metabolic Panel: Recent Labs  Lab 09/17/21 1247 09/17/21 1856 09/18/21 0606  NA 137 138 136  K 5.4* 4.6 4.6  CL 104  --  108  CO2 15*  --  18*  GLUCOSE 268*  --  165*  BUN 60*  --  50*  CREATININE 2.34*  --  1.95*  CALCIUM 9.6  --  8.9   GFR: Estimated Creatinine Clearance: 38 mL/min (A) (by C-G formula based on SCr of 1.95 mg/dL (H)). Liver Function Tests: Recent Labs  Lab 09/17/21 2323  AST 25  ALT 36  ALKPHOS 33*  BILITOT 0.8  PROT 5.3*  ALBUMIN 3.0*   No results for input(s): LIPASE, AMYLASE in the last 168 hours. No results for input(s): AMMONIA in the last 168 hours. Coagulation Profile: No results for input(s): INR, PROTIME in the last 168 hours. Cardiac Enzymes: No results for input(s): CKTOTAL, CKMB, CKMBINDEX, TROPONINI in the last 168 hours. BNP (last 3 results) No results for input(s): PROBNP in the last 8760 hours. HbA1C: No results for input(s): HGBA1C in the last 72 hours. CBG: Recent Labs  Lab 09/17/21 2159 09/17/21 2327 09/18/21 0611 09/18/21 1113  GLUCAP 133* 242* 178* 179*   Lipid Profile: No results for input(s): CHOL, HDL, LDLCALC, TRIG, CHOLHDL, LDLDIRECT in the last 72 hours. Thyroid Function Tests: No results for input(s): TSH, T4TOTAL, FREET4, T3FREE, THYROIDAB in the last 72 hours. Anemia Panel: No results for input(s): VITAMINB12,  FOLATE, FERRITIN, TIBC, IRON, RETICCTPCT in the last 72 hours. Sepsis Labs: Recent Labs  Lab 09/17/21 1731 09/17/21 2018 09/18/21 0606 09/18/21 0903  PROCALCITON  --   --  <0.10  --   LATICACIDVEN 4.1* 3.4* 2.7* 3.3*    Recent Results (from the past 240 hour(s))  Resp Panel by RT-PCR (Flu A&B, Covid) Nasopharyngeal Swab     Status: None   Collection Time: 09/17/21  5:09 PM   Specimen: Nasopharyngeal Swab; Nasopharyngeal(NP) swabs in vial transport medium  Result Value Ref Range Status   SARS Coronavirus 2 by RT PCR NEGATIVE NEGATIVE Final    Comment: (NOTE) SARS-CoV-2 target nucleic acids are NOT DETECTED.  The SARS-CoV-2 RNA is generally detectable in upper respiratory specimens  during the acute phase of infection. The lowest concentration of SARS-CoV-2 viral copies this assay can detect is 138 copies/mL. A negative result does not preclude SARS-Cov-2 infection and should not be used as the sole basis for treatment or other patient management decisions. A negative result may occur with  improper specimen collection/handling, submission of specimen other than nasopharyngeal swab, presence of viral mutation(s) within the areas targeted by this assay, and inadequate number of viral copies(<138 copies/mL). A negative result must be combined with clinical observations, patient history, and epidemiological information. The expected result is Negative.  Fact Sheet for Patients:  EntrepreneurPulse.com.au  Fact Sheet for Healthcare Providers:  IncredibleEmployment.be  This test is no t yet approved or cleared by the [place] FDA and  has been authorized for detection and/or diagnosis of SARS-CoV-2 by FDA under an Emergency Use Authorization (EUA). This EUA will remain  in effect (meaning this test can be used) for the duration of the COVID-19 declaration under Section 564(b)(1) of the Act, 21 U.S.C.section 360bbb-3(b)(1), unless the  authorization is terminated  or revoked sooner.       Influenza A by PCR NEGATIVE NEGATIVE Final   Influenza B by PCR NEGATIVE NEGATIVE Final    Comment: (NOTE) The Xpert Xpress SARS-CoV-2/FLU/RSV plus assay is intended as an aid in the diagnosis of influenza from Nasopharyngeal swab specimens and should not be used as a sole basis for treatment. Nasal washings and aspirates are unacceptable for Xpert Xpress SARS-CoV-2/FLU/RSV testing.  Fact Sheet for Patients: EntrepreneurPulse.com.au  Fact Sheet for Healthcare Providers: IncredibleEmployment.be  This test is not yet approved or cleared by the [place] FDA and has been authorized for detection and/or diagnosis of SARS-CoV-2 by FDA under an Emergency Use Authorization (EUA). This EUA will remain in effect (meaning this test can be used) for the duration of the COVID-19 declaration under Section 564(b)(1) of the Act, 21 U.S.C. section 360bbb-3(b)(1), unless the authorization is terminated or revoked.  Performed at [facility], [place] [address], [place], [place] [address]   MRSA Next Gen by PCR, Nasal     Status: None   Collection Time: 09/18/21  3:35 AM   Specimen: Nasal Mucosa; Nasal Swab  Result Value Ref Range Status   MRSA by PCR Next Gen NOT DETECTED NOT DETECTED Final    Comment: (NOTE) The GeneXpert MRSA Assay (FDA approved for NASAL specimens only), is one component of a comprehensive MRSA colonization surveillance program. It is not intended to diagnose MRSA infection nor to guide or monitor treatment for MRSA infections. Test performance is not FDA approved in patients less than 26 years old. Performed at [facility], [place] [address], [place], [place] [address]      Radiology Studies: DG Chest 1 View  Result Date: 09/17/2021 CLINICAL DATA:  Chest pressure and shortness of breath. EXAM: CHEST  1 VIEW COMPARISON:  Chest radiograph 08/26/2021, CT chest  12/19/2015 FINDINGS: Status post median sternotomy. Cardiac silhouette is again at the upper limits of normal size. Mild to moderate calcifications within the aortic arch. There are again calcified nodular densities within left lung apex, unchanged. These are consistent with the calcified pleural plaques seen on prior CT. No focal airspace opacity to indicate pneumonia. No pleural effusion or pneumothorax. No acute skeletal abnormality. IMPRESSION: No acute lung process.  Unchanged chronic findings as above. Electronically Signed   By: Yvonne Kendall   On: 09/17/2021 13:04   NM Pulmonary Perfusion  Result Date: 09/17/2021 CLINICAL  DATA:  Pulmonary embolism (PE) suspected, high prob. Shortness of breath, chest pain EXAM: NUCLEAR MEDICINE PERFUSION LUNG SCAN TECHNIQUE: Perfusion images were obtained in multiple projections after intravenous injection of radiopharmaceutical. Ventilation scans intentionally deferred if perfusion scan and chest x-ray adequate for interpretation during COVID 19 epidemic. RADIOPHARMACEUTICALS:  4.3 mCi Tc-49m MAA IV COMPARISON:  Chest x-ray today FINDINGS: No perfusion defects to suggest pulmonary embolus. IMPRESSION: No evidence of pulmonary embolus. Electronically Signed   By: Rolm Baptise M.D.   On: 09/17/2021 20:06   ECHOCARDIOGRAM COMPLETE  Result Date: 09/18/2021    ECHOCARDIOGRAM REPORT   Patient Name:   Kurt Aguilar [facility] Date of Exam: 09/18/2021 Medical Rec #:  [id number]        Height:       70.0 in Accession #:    [id number]       Weight:       225.3 lb Date of Birth:  [birth year]        BSA:          2.196 m Patient Age:    64 years         BP:           165/90 mmHg Patient Gender: M                HR:           97 bpm. Exam Location:  Inpatient Procedure: 2D Echo, Cardiac Doppler, Color Doppler and Intracardiac            Opacification Agent Indications:    Acute myocardial infarction  History:        Patient has prior history of Echocardiogram examinations, most                  recent 05/22/2009. Prior CABG; Risk Factors:Diabetes and                 Hypertension. CKD. GERD.  Sonographer:    [name] Lefort RDCS (AE) Referring Phys: [phone] JARED M GARDNER  Sonographer Comments: Technically difficult study due to poor echo windows and no subcostal window. IMPRESSIONS  1. Left ventricular ejection fraction, by estimation, is 60 to 65%. The left ventricle has normal function. The left ventricle has no regional wall motion abnormalities. Left ventricular diastolic parameters are consistent with Grade I diastolic dysfunction (impaired relaxation). Elevated left ventricular end-diastolic pressure.  2. Right ventricular systolic function is moderately reduced. The right ventricular size is normal.  3. The mitral valve is normal in structure. No evidence of mitral valve regurgitation. No evidence of mitral stenosis.  4. The AV is poorly visualized to comment on structure. There does appear to be come calcification of the valve. . The aortic valve was not well visualized. Aortic valve regurgitation is not visualized. No aortic stenosis is present. FINDINGS  Left Ventricle: Left ventricular ejection fraction, by estimation, is 60 to 65%. The left ventricle has normal function. The left ventricle has no regional wall motion abnormalities. Definity contrast agent was given IV to delineate the left ventricular  endocardial borders. The left ventricular internal cavity size was normal in size. There is no left ventricular hypertrophy. Left ventricular diastolic parameters are consistent with Grade I diastolic dysfunction (impaired relaxation). Elevated left ventricular end-diastolic pressure. Right Ventricle: The right ventricular size is normal. No increase in right ventricular wall thickness. Right ventricular systolic function is moderately reduced. Left Atrium: Left atrial size was normal in size. Right Atrium: Right atrial size was normal in  size. Pericardium: There is no evidence of pericardial  effusion. Mitral Valve: The mitral valve is normal in structure. No evidence of mitral valve regurgitation. No evidence of mitral valve stenosis. Tricuspid Valve: The tricuspid valve is normal in structure. Tricuspid valve regurgitation is mild . No evidence of tricuspid stenosis. Aortic Valve: The AV is poorly visualized to comment on structure. There does appear to be come calcification of the valve. The aortic valve was not well visualized. Aortic valve regurgitation is not visualized. No aortic stenosis is present. Aortic valve mean gradient measures 3.0 mmHg. Aortic valve peak gradient measures 4.8 mmHg. Aortic valve area, by VTI measures 2.47 cm. Pulmonic Valve: The pulmonic valve was normal in structure. Pulmonic valve regurgitation is not visualized. No evidence of pulmonic stenosis. Aorta: The aortic root is normal in size and structure. Venous: The inferior vena cava was not well visualized. IAS/Shunts: No atrial level shunt detected by color flow Doppler.  LEFT VENTRICLE PLAX 2D LVOT diam:     1.90 cm   Diastology LV SV:         52        LV e' medial:    5.66 cm/s LV SV Index:   24        LV E/e' medial:  14.8 LVOT Area:     2.84 cm  LV e' lateral:   6.09 cm/s                          LV E/e' lateral: 13.7  RIGHT VENTRICLE RV S prime:     10.30 cm/s TAPSE (M-mode): 1.3 cm LEFT ATRIUM             Index        RIGHT ATRIUM           Index LA Vol (A2C):   37.4 ml 17.03 ml/m  RA Area:     14.40 cm LA Vol (A4C):   69.8 ml 31.79 ml/m  RA Volume:   31.30 ml  14.25 ml/m LA Biplane Vol: 54.3 ml 24.73 ml/m  AORTIC VALVE AV Area (Vmax):    2.51 cm AV Area (Vmean):   2.75 cm AV Area (VTI):     2.47 cm AV Vmax:           110.00 cm/s AV Vmean:          77.800 cm/s AV VTI:            0.210 m AV Peak Grad:      4.8 mmHg AV Mean Grad:      3.0 mmHg LVOT Vmax:         97.50 cm/s LVOT Vmean:        75.400 cm/s LVOT VTI:          0.183 m LVOT/AV VTI ratio: 0.87  AORTA Ao Root diam: 2.70 cm Ao Asc diam:  2.90 cm  MITRAL VALVE                TRICUSPID VALVE MV Area (PHT): 2.05 cm     TR Peak grad:   18.5 mmHg MV Decel Time: 370 msec     TR Vmax:        215.00 cm/s MV E velocity: 83.70 cm/s MV A velocity: 163.00 cm/s  SHUNTS MV E/A ratio:  0.51         Systemic VTI:  0.18 m  Systemic Diam: 1.90 cm Fransico Him MD Electronically signed by Fransico Him MD Signature Date/Time: 09/18/2021/11:30:29 AM    Final    VAS [place] LOWER EXTREMITY VENOUS (DVT) (ONLY [facility] & [facility])  Result Date: 09/17/2021  Lower Venous DVT Study Patient Name:  KEILAND PICKERING [facility]  Date of Exam:   09/17/2021 Medical Rec #: [id number]         Accession #:    [id number] Date of Birth: [birth year]         Patient Gender: M Patient Age:   52 years Exam Location:  [facility] Procedure:      VAS [place] LOWER EXTREMITY VENOUS (DVT) Referring Phys: BRITNI HENDERLY --------------------------------------------------------------------------------  Indications: Edema.  Comparison Study: No prior study Performing Technologist: Maudry Mayhew MHA, RDMS, RVT, RDCS  Examination Guidelines: A complete evaluation includes B-mode imaging, spectral Doppler, color Doppler, and power Doppler as needed of all accessible portions of each vessel. Bilateral testing is considered an integral part of a complete examination. Limited examinations for reoccurring indications may be performed as noted. The reflux portion of the exam is performed with the patient in reverse Trendelenburg.  +---------+---------------+---------+-----------+----------+--------------+  RIGHT     Compressibility Phasicity Spontaneity Properties Thrombus Aging  +---------+---------------+---------+-----------+----------+--------------+  CFV       Full            Yes       Yes                                    +---------+---------------+---------+-----------+----------+--------------+  SFJ       Full                                                              +---------+---------------+---------+-----------+----------+--------------+  FV Prox   Full                                                             +---------+---------------+---------+-----------+----------+--------------+  FV Mid    Full                                                             +---------+---------------+---------+-----------+----------+--------------+  FV Distal Full                                                             +---------+---------------+---------+-----------+----------+--------------+  PFV       Full                                                             +---------+---------------+---------+-----------+----------+--------------+  POP       Full            Yes       Yes                                    +---------+---------------+---------+-----------+----------+--------------+  PTV       Full                                                             +---------+---------------+---------+-----------+----------+--------------+  PERO      Full                                                             +---------+---------------+---------+-----------+----------+--------------+   +---------+---------------+---------+-----------+----------+--------------+  LEFT      Compressibility Phasicity Spontaneity Properties Thrombus Aging  +---------+---------------+---------+-----------+----------+--------------+  CFV       Full            Yes       Yes                                    +---------+---------------+---------+-----------+----------+--------------+  SFJ       Full                                                             +---------+---------------+---------+-----------+----------+--------------+  FV Prox   Full                                                             +---------+---------------+---------+-----------+----------+--------------+  FV Mid    Full                                                              +---------+---------------+---------+-----------+----------+--------------+  FV Distal Full                                                             +---------+---------------+---------+-----------+----------+--------------+  PFV       Full                                                             +---------+---------------+---------+-----------+----------+--------------+  POP       Full            Yes       Yes                                    +---------+---------------+---------+-----------+----------+--------------+  PTV       Full                                                             +---------+---------------+---------+-----------+----------+--------------+  PERO      Full                                                             +---------+---------------+---------+-----------+----------+--------------+     Summary: RIGHT: - There is no evidence of deep vein thrombosis in the lower extremity.  - No cystic structure found in the popliteal fossa.  LEFT: - There is no evidence of deep vein thrombosis in the lower extremity.  - No cystic structure found in the popliteal fossa.  *See table(s) above for measurements and observations. Electronically signed by Harold Barban MD on 09/17/2021 at 9:42:23 PM.    Final     Scheduled Meds:  aspirin  81 mg Oral BID PC   ezetimibe  10 mg Oral Daily   famotidine  20 mg Oral QHS   insulin aspart  0-15 Units Subcutaneous TID WC   insulin aspart  0-5 Units Subcutaneous QHS   insulin NPH Human  10 Units Subcutaneous QAC breakfast   pantoprazole  80 mg Oral BID AC   predniSONE  10 mg Oral BID WC   rosuvastatin  40 mg Oral Daily   sodium chloride flush  3 mL Intravenous Q12H   Continuous Infusions:  sodium chloride     heparin 900 Units/hr (09/18/21 1206)     LOS: 1 day   Time spent: 34-minute  Darliss Cheney, MD [facility]  09/18/2021, 12:44 PM  Please page via Shea [facility] and do not message via secure chat for anything urgent. Secure chat  can be used for anything non urgent.  How to contact the [facility] Attending or Consulting provider [place] or covering provider during after hours [place], for this patient?  Check the care team in [facility] and look for a) attending/consulting [facility] provider listed and b) the [facility] team listed. Page or secure chat [phone]. Log into [email or web address] and use [place]'s universal password to access. If you do not have the password, please contact the hospital operator. Locate the [facility] provider you are looking for under [facility] and page to a number that you can be directly reached. If you still have difficulty reaching the provider, please page the [facility] (Director on Call) for the Hospitalists listed on amion for assistance.

## 2021-09-18 NOTE — Progress Notes (Signed)
ANTICOAGULATION CONSULT NOTE   Pharmacy Consult for IV heparin Indication: chest pain/ACS  Allergies  Allergen Reactions   Oxycodone-Acetaminophen Other (See Comments)    Stops breathing; has tolerated Tylenol before    Patient Measurements: Height: 5\' 10"  (177.8 cm) Weight: 102.2 kg (225 lb 5 oz) IBW/kg (Calculated) : 73 Heparin Dosing Weight: 94.77 kg  Vital Signs: Temp: 98 F (36.7 C) (01/11 1941) Temp Source: Oral (01/11 1941) BP: 152/80 (01/11 1941) Pulse Rate: 106 (01/11 1941)  Labs: Recent Labs    09/17/21 1247 09/17/21 1731 09/17/21 1856 09/18/21 0606 09/18/21 1014 09/18/21 1956  HGB 14.3  --  11.9* 13.2  --   --   HCT 44.0  --  35.0* 38.4*  --   --   PLT 172  --   --  135*  --   --   HEPARINUNFRC  --   --   --  >1.10* >1.10* >1.10*  CREATININE 2.34*  --   --  1.95*  --   --   TROPONINIHS 250* 195*  --   --   --   --      Estimated Creatinine Clearance: 38 mL/min (A) (by C-G formula based on SCr of 1.95 mg/dL (H)).   Assessment: 2 YOM presenting to the ED with chest pain. No history of anticoagulation prior to admission. Pharmacy to dose IV heparin.   Heparin level remains >1.1 and level was drawn from arm opposite where heparin running. RN notes that IV site is oozing some.  Goal of Therapy:  Heparin level 0.3-0.7 units/ml Monitor platelets by anticoagulation protocol: Yes   Plan:  Hold heparin x 90 min and restart at 650 units/hr Check heparin level 8 hours post restart  Sherlon Handing, PharmD, BCPS Please see amion for complete clinical pharmacist phone list 09/18/2021 9:28 PM

## 2021-09-18 NOTE — Plan of Care (Signed)

## 2021-09-18 NOTE — H&P (View-Only) (Signed)
Progress Note  Patient Name: Kurt Aguilar Date of Encounter: 09/18/2021  Primary Cardiologist: None   Subjective   Patient was seen and examined at his bedside. His wife was at the bedside. He notes since the heparin gtt he has felt better.   Inpatient Medications    Scheduled Meds:  aspirin  81 mg Oral BID PC   ezetimibe  10 mg Oral Daily   famotidine  20 mg Oral QHS   insulin aspart  0-15 Units Subcutaneous TID WC   insulin aspart  0-5 Units Subcutaneous QHS   insulin NPH Human  10 Units Subcutaneous QAC breakfast   pantoprazole  80 mg Oral BID AC   predniSONE  10 mg Oral BID WC   rosuvastatin  40 mg Oral Daily   Continuous Infusions:  heparin 1,200 Units/hr (09/18/21 0415)   PRN Meds: acetaminophen, nitroGLYCERIN, ondansetron (ZOFRAN) IV, perflutren lipid microspheres (DEFINITY) IV suspension   Vital Signs    Vitals:   09/17/21 2330 09/18/21 0200 09/18/21 0304 09/18/21 0719  BP: 139/75 117/81 132/82 (!) 165/90  Pulse: 92 85 79 91  Resp: 14 (!) 26 15 (!) 23  Temp:   98 F (36.7 C) 98 F (36.7 C)  TempSrc:   Oral Oral  SpO2: 98% 96% 96% 99%  Weight:   102.2 kg   Height:   5\' 10"  (1.778 m)     Intake/Output Summary (Last 24 hours) at 09/18/2021 1055 Last data filed at 09/18/2021 0900 Gross per 24 hour  Intake 954.17 ml  Output 1275 ml  Net -320.83 ml   Filed Weights   09/18/21 0304  Weight: 102.2 kg    Telemetry    Sinus rhythm- Personally Reviewed  ECG    None today- Personally Reviewed  Physical Exam    General: Comfortable Head: Atraumatic, normal size  Eyes: PEERLA, EOMI  Neck: Supple, normal JVD Cardiac: Normal S1, S2; RRR; no murmurs, rubs, or gallops Lungs: Clear to auscultation bilaterally Abd: Soft, nontender, no hepatomegaly  Ext: warm, no edema Musculoskeletal: No deformities, BUE and BLE strength normal and equal Skin: Warm and dry, no rashes   Neuro: Alert and oriented to person, place, time, and situation, CNII-XII  grossly intact, no focal deficits  Psych: Normal mood and affect   Labs    Chemistry Recent Labs  Lab 09/17/21 1247 09/17/21 1856 09/17/21 2323 09/18/21 0606  NA 137 138  --  136  K 5.4* 4.6  --  4.6  CL 104  --   --  108  CO2 15*  --   --  18*  GLUCOSE 268*  --   --  165*  BUN 60*  --   --  50*  CREATININE 2.34*  --   --  1.95*  CALCIUM 9.6  --   --  8.9  PROT  --   --  5.3*  --   ALBUMIN  --   --  3.0*  --   AST  --   --  25  --   ALT  --   --  36  --   ALKPHOS  --   --  33*  --   BILITOT  --   --  0.8  --   GFRNONAA 28*  --   --  35*  ANIONGAP 18*  --   --  10     Hematology Recent Labs  Lab 09/17/21 1247 09/17/21 1856 09/18/21 0606  WBC 13.7*  --  11.4*  RBC 4.38  --  3.98*  HGB 14.3 11.9* 13.2  HCT 44.0 35.0* 38.4*  MCV 100.5*  --  96.5  MCH 32.6  --  33.2  MCHC 32.5  --  34.4  RDW 13.7  --  13.6  PLT 172  --  135*    Cardiac EnzymesNo results for input(s): TROPONINI in the last 168 hours. No results for input(s): TROPIPOC in the last 168 hours.   BNP Recent Labs  Lab 09/17/21 1731  BNP 37.7     DDimer  Recent Labs  Lab 09/17/21 1731  DDIMER 0.34     Radiology    DG Chest 1 View  Result Date: 09/17/2021 CLINICAL DATA:  Chest pressure and shortness of breath. EXAM: CHEST  1 VIEW COMPARISON:  Chest radiograph 08/26/2021, CT chest 12/19/2015 FINDINGS: Status post median sternotomy. Cardiac silhouette is again at the upper limits of normal size. Mild to moderate calcifications within the aortic arch. There are again calcified nodular densities within left lung apex, unchanged. These are consistent with the calcified pleural plaques seen on prior CT. No focal airspace opacity to indicate pneumonia. No pleural effusion or pneumothorax. No acute skeletal abnormality. IMPRESSION: No acute lung process.  Unchanged chronic findings as above. Electronically Signed   By: Yvonne Kendall   On: 09/17/2021 13:04   NM Pulmonary Perfusion  Result Date:  09/17/2021 CLINICAL DATA:  Pulmonary embolism (PE) suspected, high prob. Shortness of breath, chest pain EXAM: NUCLEAR MEDICINE PERFUSION LUNG SCAN TECHNIQUE: Perfusion images were obtained in multiple projections after intravenous injection of radiopharmaceutical. Ventilation scans intentionally deferred if perfusion scan and chest x-ray adequate for interpretation during COVID 19 epidemic. RADIOPHARMACEUTICALS:  4.3 mCi Tc-34m MAA IV COMPARISON:  Chest x-ray today FINDINGS: No perfusion defects to suggest pulmonary embolus. IMPRESSION: No evidence of pulmonary embolus. Electronically Signed   By: Rolm Baptise M.D.   On: 09/17/2021 20:06   VAS [place] LOWER EXTREMITY VENOUS (DVT) (ONLY [facility] & [facility])  Result Date: 09/17/2021  Lower Venous DVT Study Patient Name:  Kurt Aguilar [facility]  Date of Exam:   09/17/2021 Medical Rec #: [id number]         Accession #:    [id number] Date of Birth: 1946/02/24         Patient Gender: M Patient Age:   76 years Exam Location:  [facility] Procedure:      VAS [place] LOWER EXTREMITY VENOUS (DVT) Referring Phys: BRITNI HENDERLY --------------------------------------------------------------------------------  Indications: Edema.  Comparison Study: No prior study Performing Technologist: Maudry Mayhew MHA, RDMS, RVT, RDCS  Examination Guidelines: A complete evaluation includes B-mode imaging, spectral Doppler, color Doppler, and power Doppler as needed of all accessible portions of each vessel. Bilateral testing is considered an integral part of a complete examination. Limited examinations for reoccurring indications may be performed as noted. The reflux portion of the exam is performed with the patient in reverse Trendelenburg.  +---------+---------------+---------+-----------+----------+--------------+  RIGHT     Compressibility Phasicity Spontaneity Properties Thrombus Aging  +---------+---------------+---------+-----------+----------+--------------+  CFV       Full            Yes        Yes                                    +---------+---------------+---------+-----------+----------+--------------+  SFJ       Full                                                             +---------+---------------+---------+-----------+----------+--------------+  FV Prox   Full                                                             +---------+---------------+---------+-----------+----------+--------------+  FV Mid    Full                                                             +---------+---------------+---------+-----------+----------+--------------+  FV Distal Full                                                             +---------+---------------+---------+-----------+----------+--------------+  PFV       Full                                                             +---------+---------------+---------+-----------+----------+--------------+  POP       Full            Yes       Yes                                    +---------+---------------+---------+-----------+----------+--------------+  PTV       Full                                                             +---------+---------------+---------+-----------+----------+--------------+  PERO      Full                                                             +---------+---------------+---------+-----------+----------+--------------+   +---------+---------------+---------+-----------+----------+--------------+  LEFT      Compressibility Phasicity Spontaneity Properties Thrombus Aging  +---------+---------------+---------+-----------+----------+--------------+  CFV       Full            Yes       Yes                                    +---------+---------------+---------+-----------+----------+--------------+  SFJ       Full                                                             +---------+---------------+---------+-----------+----------+--------------+  FV Prox   Full                                                              +---------+---------------+---------+-----------+----------+--------------+  FV Mid    Full                                                             +---------+---------------+---------+-----------+----------+--------------+  FV Distal Full                                                             +---------+---------------+---------+-----------+----------+--------------+  PFV       Full                                                             +---------+---------------+---------+-----------+----------+--------------+  POP       Full            Yes       Yes                                    +---------+---------------+---------+-----------+----------+--------------+  PTV       Full                                                             +---------+---------------+---------+-----------+----------+--------------+  PERO      Full                                                             +---------+---------------+---------+-----------+----------+--------------+     Summary: RIGHT: - There is no evidence of deep vein thrombosis in the lower extremity.  - No cystic structure found in the popliteal fossa.  LEFT: - There is no evidence of deep vein thrombosis in the lower extremity.  - No cystic structure found in the popliteal fossa.  *See table(s) above for measurements and observations. Electronically signed by Harold Barban MD on 09/17/2021 at 9:42:23 PM.    Final     Cardiac Studies   TTE 09/18/2021 IMPRESSIONS     1. Left ventricular ejection fraction, by estimation, is 60 to 65%. The  left ventricle has normal function. The left ventricle has no regional  wall motion abnormalities. Left ventricular diastolic parameters are  consistent  with Grade I diastolic  dysfunction (impaired relaxation). Elevated left ventricular end-diastolic  pressure.   2. Right ventricular systolic function is moderately reduced. The right  ventricular size is normal.   3. The mitral valve is normal in  structure. No evidence of mitral valve  regurgitation. No evidence of mitral stenosis.   4. The AV is poorly visualized to comment on structure. There does appear  to be come calcification of the valve. . The aortic valve was not well  visualized. Aortic valve regurgitation is not visualized. No aortic  stenosis is present.   FINDINGS   Left Ventricle: Left ventricular ejection fraction, by estimation, is 60  to 65%. The left ventricle has normal function. The left ventricle has no  regional wall motion abnormalities. Definity contrast agent was given IV  to delineate the left ventricular   endocardial borders. The left ventricular internal cavity size was normal  in size. There is no left ventricular hypertrophy. Left ventricular  diastolic parameters are consistent with Grade I diastolic dysfunction  (impaired relaxation). Elevated left  ventricular end-diastolic pressure.   Right Ventricle: The right ventricular size is normal. No increase in  right ventricular wall thickness. Right ventricular systolic function is  moderately reduced.   Left Atrium: Left atrial size was normal in size.   Right Atrium: Right atrial size was normal in size.   Pericardium: There is no evidence of pericardial effusion.   Mitral Valve: The mitral valve is normal in structure. No evidence of  mitral valve regurgitation. No evidence of mitral valve stenosis.   Tricuspid Valve: The tricuspid valve is normal in structure. Tricuspid  valve regurgitation is mild . No evidence of tricuspid stenosis.   Aortic Valve: The AV is poorly visualized to comment on structure. There  does appear to be come calcification of the valve. The aortic valve was  not well visualized. Aortic valve regurgitation is not visualized. No  aortic stenosis is present. Aortic  valve mean gradient measures 3.0 mmHg. Aortic valve peak gradient measures  4.8 mmHg. Aortic valve area, by VTI measures 2.47 cm.   Pulmonic Valve: The  pulmonic valve was normal in structure. Pulmonic valve  regurgitation is not visualized. No evidence of pulmonic stenosis.   Aorta: The aortic root is normal in size and structure.   Venous: The inferior vena cava was not well visualized.   IAS/Shunts: No atrial level shunt detected by color flow Doppler.   Patient Profile     76 y.o. male this is a 76 year old male with history of coronary artery disease status post CABG x5 in 1997, type 2 diabetes, chronic kidney disease 3.  Assessment & Plan    Coronary artery disease status post CABG with recent NSTEMI-continue patient on heparin drip.  Given his high risk for worsening of coronary artery disease elected proceed with a left heart catheterization in this patient.  For now continue aspirin, Crestor, Zetia and his heparin.  The patient understands that risks include but are not limited to stroke (1 in 1000), death (1 in 80), kidney failure [usually temporary] (1 in 500), bleeding (1 in 200), allergic reaction [possibly serious] (1 in 200), and agrees to proceed.  Echocardiogram today normal EF.  Hypertension diabetes mellitus per primary team.  CKD creatinine has improved.  We will e recommend gentle hydration post heart catheterization.  For questions or updates, please contact [place] Please consult [email or web address] for contact info under Cardiology/STEMI.      Signed, Berniece Salines, DO  09/18/2021, 10:55 AM

## 2021-09-18 NOTE — Progress Notes (Signed)
Progress Note  Patient Name: Kurt Aguilar Date of Encounter: 09/18/2021  Primary Cardiologist: None   Subjective   Patient was seen and examined at his bedside. His wife was at the bedside. He notes since the heparin gtt he has felt better.   Inpatient Medications    Scheduled Meds:  aspirin  81 mg Oral BID PC   ezetimibe  10 mg Oral Daily   famotidine  20 mg Oral QHS   insulin aspart  0-15 Units Subcutaneous TID WC   insulin aspart  0-5 Units Subcutaneous QHS   insulin NPH Human  10 Units Subcutaneous QAC breakfast   pantoprazole  80 mg Oral BID AC   predniSONE  10 mg Oral BID WC   rosuvastatin  40 mg Oral Daily   Continuous Infusions:  heparin 1,200 Units/hr (09/18/21 0415)   PRN Meds: acetaminophen, nitroGLYCERIN, ondansetron (ZOFRAN) IV, perflutren lipid microspheres (DEFINITY) IV suspension   Vital Signs    Vitals:   09/17/21 2330 09/18/21 0200 09/18/21 0304 09/18/21 0719  BP: 139/75 117/81 132/82 (!) 165/90  Pulse: 92 85 79 91  Resp: 14 (!) 26 15 (!) 23  Temp:   98 F (36.7 C) 98 F (36.7 C)  TempSrc:   Oral Oral  SpO2: 98% 96% 96% 99%  Weight:   102.2 kg   Height:   5\' 10"  (1.778 m)     Intake/Output Summary (Last 24 hours) at 09/18/2021 1055 Last data filed at 09/18/2021 0900 Gross per 24 hour  Intake 954.17 ml  Output 1275 ml  Net -320.83 ml   Filed Weights   09/18/21 0304  Weight: 102.2 kg    Telemetry    Sinus rhythm- Personally Reviewed  ECG    None today- Personally Reviewed  Physical Exam    General: Comfortable Head: Atraumatic, normal size  Eyes: PEERLA, EOMI  Neck: Supple, normal JVD Cardiac: Normal S1, S2; RRR; no murmurs, rubs, or gallops Lungs: Clear to auscultation bilaterally Abd: Soft, nontender, no hepatomegaly  Ext: warm, no edema Musculoskeletal: No deformities, BUE and BLE strength normal and equal Skin: Warm and dry, no rashes   Neuro: Alert and oriented to person, place, time, and situation, CNII-XII  grossly intact, no focal deficits  Psych: Normal mood and affect   Labs    Chemistry Recent Labs  Lab 09/17/21 1247 09/17/21 1856 09/17/21 2323 09/18/21 0606  NA 137 138  --  136  K 5.4* 4.6  --  4.6  CL 104  --   --  108  CO2 15*  --   --  18*  GLUCOSE 268*  --   --  165*  BUN 60*  --   --  50*  CREATININE 2.34*  --   --  1.95*  CALCIUM 9.6  --   --  8.9  PROT  --   --  5.3*  --   ALBUMIN  --   --  3.0*  --   AST  --   --  25  --   ALT  --   --  36  --   ALKPHOS  --   --  33*  --   BILITOT  --   --  0.8  --   GFRNONAA 28*  --   --  35*  ANIONGAP 18*  --   --  10     Hematology Recent Labs  Lab 09/17/21 1247 09/17/21 1856 09/18/21 0606  WBC 13.7*  --  11.4*  RBC 4.38  --  3.98*  HGB 14.3 11.9* 13.2  HCT 44.0 35.0* 38.4*  MCV 100.5*  --  96.5  MCH 32.6  --  33.2  MCHC 32.5  --  34.4  RDW 13.7  --  13.6  PLT 172  --  135*    Cardiac EnzymesNo results for input(s): TROPONINI in the last 168 hours. No results for input(s): TROPIPOC in the last 168 hours.   BNP Recent Labs  Lab 09/17/21 1731  BNP 37.7     DDimer  Recent Labs  Lab 09/17/21 1731  DDIMER 0.34     Radiology    DG Chest 1 View  Result Date: 09/17/2021 CLINICAL DATA:  Chest pressure and shortness of breath. EXAM: CHEST  1 VIEW COMPARISON:  Chest radiograph 08/26/2021, CT chest 12/19/2015 FINDINGS: Status post median sternotomy. Cardiac silhouette is again at the upper limits of normal size. Mild to moderate calcifications within the aortic arch. There are again calcified nodular densities within left lung apex, unchanged. These are consistent with the calcified pleural plaques seen on prior CT. No focal airspace opacity to indicate pneumonia. No pleural effusion or pneumothorax. No acute skeletal abnormality. IMPRESSION: No acute lung process.  Unchanged chronic findings as above. Electronically Signed   By: Yvonne Kendall   On: 09/17/2021 13:04   NM Pulmonary Perfusion  Result Date:  09/17/2021 CLINICAL DATA:  Pulmonary embolism (PE) suspected, high prob. Shortness of breath, chest pain EXAM: NUCLEAR MEDICINE PERFUSION LUNG SCAN TECHNIQUE: Perfusion images were obtained in multiple projections after intravenous injection of radiopharmaceutical. Ventilation scans intentionally deferred if perfusion scan and chest x-ray adequate for interpretation during COVID 19 epidemic. RADIOPHARMACEUTICALS:  4.3 mCi Tc-34m MAA IV COMPARISON:  Chest x-ray today FINDINGS: No perfusion defects to suggest pulmonary embolus. IMPRESSION: No evidence of pulmonary embolus. Electronically Signed   By: Rolm Baptise M.D.   On: 09/17/2021 20:06   VAS [place] LOWER EXTREMITY VENOUS (DVT) (ONLY [facility] & [facility])  Result Date: 09/17/2021  Lower Venous DVT Study Patient Name:  Kurt Aguilar [facility]  Date of Exam:   09/17/2021 Medical Rec #: [id number]         Accession #:    [id number] Date of Birth: 1946-08-08         Patient Gender: M Patient Age:   76 years Exam Location:  [facility] Procedure:      VAS [place] LOWER EXTREMITY VENOUS (DVT) Referring Phys: BRITNI HENDERLY --------------------------------------------------------------------------------  Indications: Edema.  Comparison Study: No prior study Performing Technologist: Maudry Mayhew MHA, RDMS, RVT, RDCS  Examination Guidelines: A complete evaluation includes B-mode imaging, spectral Doppler, color Doppler, and power Doppler as needed of all accessible portions of each vessel. Bilateral testing is considered an integral part of a complete examination. Limited examinations for reoccurring indications may be performed as noted. The reflux portion of the exam is performed with the patient in reverse Trendelenburg.  +---------+---------------+---------+-----------+----------+--------------+  RIGHT     Compressibility Phasicity Spontaneity Properties Thrombus Aging  +---------+---------------+---------+-----------+----------+--------------+  CFV       Full            Yes        Yes                                    +---------+---------------+---------+-----------+----------+--------------+  SFJ       Full                                                             +---------+---------------+---------+-----------+----------+--------------+  FV Prox   Full                                                             +---------+---------------+---------+-----------+----------+--------------+  FV Mid    Full                                                             +---------+---------------+---------+-----------+----------+--------------+  FV Distal Full                                                             +---------+---------------+---------+-----------+----------+--------------+  PFV       Full                                                             +---------+---------------+---------+-----------+----------+--------------+  POP       Full            Yes       Yes                                    +---------+---------------+---------+-----------+----------+--------------+  PTV       Full                                                             +---------+---------------+---------+-----------+----------+--------------+  PERO      Full                                                             +---------+---------------+---------+-----------+----------+--------------+   +---------+---------------+---------+-----------+----------+--------------+  LEFT      Compressibility Phasicity Spontaneity Properties Thrombus Aging  +---------+---------------+---------+-----------+----------+--------------+  CFV       Full            Yes       Yes                                    +---------+---------------+---------+-----------+----------+--------------+  SFJ       Full                                                             +---------+---------------+---------+-----------+----------+--------------+  FV Prox   Full                                                              +---------+---------------+---------+-----------+----------+--------------+  FV Mid    Full                                                             +---------+---------------+---------+-----------+----------+--------------+  FV Distal Full                                                             +---------+---------------+---------+-----------+----------+--------------+  PFV       Full                                                             +---------+---------------+---------+-----------+----------+--------------+  POP       Full            Yes       Yes                                    +---------+---------------+---------+-----------+----------+--------------+  PTV       Full                                                             +---------+---------------+---------+-----------+----------+--------------+  PERO      Full                                                             +---------+---------------+---------+-----------+----------+--------------+     Summary: RIGHT: - There is no evidence of deep vein thrombosis in the lower extremity.  - No cystic structure found in the popliteal fossa.  LEFT: - There is no evidence of deep vein thrombosis in the lower extremity.  - No cystic structure found in the popliteal fossa.  *See table(s) above for measurements and observations. Electronically signed by Harold Barban MD on 09/17/2021 at 9:42:23 PM.    Final     Cardiac Studies   TTE 09/18/2021 IMPRESSIONS     1. Left ventricular ejection fraction, by estimation, is 60 to 65%. The  left ventricle has normal function. The left ventricle has no regional  wall motion abnormalities. Left ventricular diastolic parameters are  consistent  with Grade I diastolic  dysfunction (impaired relaxation). Elevated left ventricular end-diastolic  pressure.   2. Right ventricular systolic function is moderately reduced. The right  ventricular size is normal.   3. The mitral valve is normal in  structure. No evidence of mitral valve  regurgitation. No evidence of mitral stenosis.   4. The AV is poorly visualized to comment on structure. There does appear  to be come calcification of the valve. . The aortic valve was not well  visualized. Aortic valve regurgitation is not visualized. No aortic  stenosis is present.   FINDINGS   Left Ventricle: Left ventricular ejection fraction, by estimation, is 60  to 65%. The left ventricle has normal function. The left ventricle has no  regional wall motion abnormalities. Definity contrast agent was given IV  to delineate the left ventricular   endocardial borders. The left ventricular internal cavity size was normal  in size. There is no left ventricular hypertrophy. Left ventricular  diastolic parameters are consistent with Grade I diastolic dysfunction  (impaired relaxation). Elevated left  ventricular end-diastolic pressure.   Right Ventricle: The right ventricular size is normal. No increase in  right ventricular wall thickness. Right ventricular systolic function is  moderately reduced.   Left Atrium: Left atrial size was normal in size.   Right Atrium: Right atrial size was normal in size.   Pericardium: There is no evidence of pericardial effusion.   Mitral Valve: The mitral valve is normal in structure. No evidence of  mitral valve regurgitation. No evidence of mitral valve stenosis.   Tricuspid Valve: The tricuspid valve is normal in structure. Tricuspid  valve regurgitation is mild . No evidence of tricuspid stenosis.   Aortic Valve: The AV is poorly visualized to comment on structure. There  does appear to be come calcification of the valve. The aortic valve was  not well visualized. Aortic valve regurgitation is not visualized. No  aortic stenosis is present. Aortic  valve mean gradient measures 3.0 mmHg. Aortic valve peak gradient measures  4.8 mmHg. Aortic valve area, by VTI measures 2.47 cm.   Pulmonic Valve: The  pulmonic valve was normal in structure. Pulmonic valve  regurgitation is not visualized. No evidence of pulmonic stenosis.   Aorta: The aortic root is normal in size and structure.   Venous: The inferior vena cava was not well visualized.   IAS/Shunts: No atrial level shunt detected by color flow Doppler.   Patient Profile     76 y.o. male this is a 76 year old male with history of coronary artery disease status post CABG x5 in 1997, type 2 diabetes, chronic kidney disease 3.  Assessment & Plan    Coronary artery disease status post CABG with recent NSTEMI-continue patient on heparin drip.  Given his high risk for worsening of coronary artery disease elected proceed with a left heart catheterization in this patient.  For now continue aspirin, Crestor, Zetia and his heparin.  The patient understands that risks include but are not limited to stroke (1 in 1000), death (1 in 81), kidney failure [usually temporary] (1 in 500), bleeding (1 in 200), allergic reaction [possibly serious] (1 in 200), and agrees to proceed.  Echocardiogram today normal EF.  Hypertension diabetes mellitus per primary team.  CKD creatinine has improved.  We will e recommend gentle hydration post heart catheterization.  For questions or updates, please contact [place] Please consult [email or web address] for contact info under Cardiology/STEMI.      Signed, Berniece Salines, DO  09/18/2021, 10:55 AM

## 2021-09-18 NOTE — H&P (Signed)
NSTEMI 1997 CABG x 5 (LIMA LAD; SVG RCA; SVG Diagonal; Seq SVG OM1 and 2) Creat 1.95 ; CKD 3b Probably left radial

## 2021-09-18 NOTE — Progress Notes (Addendum)
Inpatient Diabetes Program Recommendations  AACE/ADA: New Consensus Statement on Inpatient Glycemic Control (2015)  Target Ranges:  Prepandial:   less than 140 mg/dL      Peak postprandial:   less than 180 mg/dL (1-2 hours)      Critically ill patients:  140 - 180 mg/dL   Lab Results  Component Value Date   GLUCAP 178 (H) 09/18/2021   HGBA1C 9.5 (A) 09/13/2021    Review of Glycemic Control  Latest Reference Range & Units 09/18/21 06:11  Glucose-Capillary 70 - 99 mg/dL 178 (H)   Diabetes history: DM 2 Outpatient Diabetes medications:  70/30 20 units q AM, Invokana 100 mg every other day Current orders for Inpatient glycemic control:  Novolog moderate tid with meals and HS NPH 10 units q AM Prednisone 10 mg bid  Inpatient Diabetes Program Recommendations:    Agree with current orders.  Note that A1C is > goal.  ? If patient would benefit from an evening dose of 70/30 as well possibly with dinner?    Thanks,  Adah Perl, RN, BC-ADM Inpatient Diabetes Coordinator Pager [phone]  (8a-5p)  Addendum: Spoke to patient by phone.  He states he was newly started on insulin last week.  He states that prior to visit with MD on 1/6- his blood sugars seemed to be well controlled with A1C in the 6% range.  He notes that even after starting insulin his blood sugars were "all over the place". He was also taking prednisone and states he had been on that for some time.  Explained how 70/30 works and potential need for 70/30 with evening meal as well.  Encouraged f/u with PCP as well. Will follow.

## 2021-09-18 NOTE — Progress Notes (Signed)
[place] for IV heparin Indication: chest pain/ACS  Allergies  Allergen Reactions   Oxycodone-Acetaminophen Other (See Comments)    Stops breathing; has tolerated Tylenol before    Patient Measurements: Height: 5\' 10"  (177.8 cm) Weight: 102.2 kg (225 lb 5 oz) IBW/kg (Calculated) : 73 Heparin Dosing Weight: 94.77 kg  Vital Signs: Temp: 98 F (36.7 C) (01/11 0719) Temp Source: Oral (01/11 0719) BP: 155/88 (01/11 1105) Pulse Rate: 91 (01/11 0719)  Labs: Recent Labs    09/17/21 1247 09/17/21 1731 09/17/21 1856 09/18/21 0606 09/18/21 1014  HGB 14.3  --  11.9* 13.2  --   HCT 44.0  --  35.0* 38.4*  --   PLT 172  --   --  135*  --   HEPARINUNFRC  --   --   --  >1.10* >1.10*  CREATININE 2.34*  --   --  1.95*  --   TROPONINIHS 250* 195*  --   --   --      Estimated Creatinine Clearance: 38 mL/min (A) (by C-G formula based on SCr of 1.95 mg/dL (H)).   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 No history of anticoagulation prior to admission. Pharmacy to dose IV heparin.   Heparin level >1.1 and confirmed with recheck. No bleeding or IV issues noted. Rn mentions patient likely to go to cath this afternoon.   Goal of Therapy:  Heparin level 0.3-0.7 units/ml Monitor platelets by anticoagulation protocol: Yes   Plan:  Hold heparin x 1 hour then restart at 900 units/hr Check heparin level in 8  hours vs follow up after cath  Thank you for involving pharmacy in this patient's care.  Erin Hearing PharmD., BCPS Clinical Pharmacist 09/18/2021 11:08 AM

## 2021-09-18 NOTE — Plan of Care (Signed)
°  Problem: Education: Goal: Understanding of cardiac disease, CV risk reduction, and recovery process will improve Outcome: Progressing Goal: Individualized Educational Video(s) Outcome: Progressing   Problem: Cardiac: Goal: Ability to achieve and maintain adequate cardiovascular perfusion will improve Outcome: Progressing   

## 2021-09-18 NOTE — Progress Notes (Signed)
° °  Due to emergencies in the cath lab, pt's cath will be tomorrow 09/19/21 second case with Dr. Tamala Julian.  Pt may eat.  Pt and family notified and agreeable to change.  Will hold fluids for now and resume in AM.  Cecilie Kicks, FNP-C [facility]  [phone] or after 5pm and on weekends call [phone] 09/18/2021.

## 2021-09-18 NOTE — Telephone Encounter (Signed)
Attempted to reach pt via phone, unable to make contact LDM on cell VM (DPR approved)  Advised CANNOT offer advice of POC while pt is currently admitted d/t pt is under the care of hospital admission team. Dr. [name] cannot intervene with current POC or offer recommendation as he is not part of the care team as pt is currently under admission with a different care team.  Pt has appt on 1/16 with Dr. [name], can discuss POC and hospital admission at that time. Apologize for any inconveniences.

## 2021-09-18 NOTE — Plan of Care (Signed)
  Problem: Activity: Goal: Ability to tolerate increased activity will improve Outcome: Progressing   Problem: Cardiac: Goal: Ability to achieve and maintain adequate cardiovascular perfusion will improve Outcome: Progressing   Problem: Health Behavior/Discharge Planning: Goal: Ability to safely manage health-related needs after discharge will improve Outcome: Progressing   

## 2021-09-19 ENCOUNTER — Encounter (HOSPITAL_COMMUNITY)
Admission: EM | Disposition: A | Discharge status: Skilled Nursing Facility | Payer: Self-pay | Source: Home / Self Care | Attending: Internal Medicine

## 2021-09-19 ENCOUNTER — Encounter (HOSPITAL_COMMUNITY): Payer: Self-pay | Admitting: Interventional Cardiology

## 2021-09-19 DIAGNOSIS — I249 Acute ischemic heart disease, unspecified: Secondary | ICD-10-CM | POA: Diagnosis not present

## 2021-09-19 DIAGNOSIS — I2510 Atherosclerotic heart disease of native coronary artery without angina pectoris: Secondary | ICD-10-CM | POA: Diagnosis not present

## 2021-09-19 HISTORY — PX: LEFT HEART CATH AND CORS/GRAFTS ANGIOGRAPHY: CATH118250

## 2021-09-19 LAB — CBC
HCT: 38.1 % — ABNORMAL LOW (ref 39.0–52.0)
Hemoglobin: 12.2 g/dL — ABNORMAL LOW (ref 13.0–17.0)
MCH: 31.9 pg (ref 26.0–34.0)
MCHC: 32 g/dL (ref 30.0–36.0)
MCV: 99.7 fL (ref 80.0–100.0)
Platelets: 127 10*3/uL — ABNORMAL LOW (ref 150–400)
RBC: 3.82 MIL/uL — ABNORMAL LOW (ref 4.22–5.81)
RDW: 13.5 % (ref 11.5–15.5)
WBC: 11 10*3/uL — ABNORMAL HIGH (ref 4.0–10.5)
nRBC: 0.3 % — ABNORMAL HIGH (ref 0.0–0.2)

## 2021-09-19 LAB — BASIC METABOLIC PANEL
Anion gap: 13 (ref 5–15)
BUN: 41 mg/dL — ABNORMAL HIGH (ref 8–23)
CO2: 16 mmol/L — ABNORMAL LOW (ref 22–32)
Calcium: 8.6 mg/dL — ABNORMAL LOW (ref 8.9–10.3)
Chloride: 108 mmol/L (ref 98–111)
Creatinine, Ser: 2.15 mg/dL — ABNORMAL HIGH (ref 0.61–1.24)
GFR, Estimated: 31 mL/min — ABNORMAL LOW (ref >60)
Glucose, Bld: 204 mg/dL — ABNORMAL HIGH (ref 70–99)
Potassium: 4.4 mmol/L (ref 3.5–5.1)
Sodium: 137 mmol/L (ref 135–145)

## 2021-09-19 LAB — GLUCOSE, CAPILLARY
Glucose-Capillary: 175 mg/dL — ABNORMAL HIGH (ref 70–99)
Glucose-Capillary: 99 mg/dL (ref 70–99)
Glucose-Capillary: 357 mg/dL — ABNORMAL HIGH (ref 70–99)
Glucose-Capillary: 265 mg/dL — ABNORMAL HIGH (ref 70–99)

## 2021-09-19 LAB — HEPARIN LEVEL (UNFRACTIONATED): Heparin Unfractionated: 0.59 IU/mL (ref 0.30–0.70)

## 2021-09-19 LAB — LACTIC ACID, PLASMA
Lactic Acid, Venous: 2.2 mmol/L (ref 0.5–1.9)
Lactic Acid, Venous: 3.5 mmol/L (ref 0.5–1.9)

## 2021-09-19 LAB — POCT ACTIVATED CLOTTING TIME: Activated Clotting Time: 131 seconds

## 2021-09-19 SURGERY — LEFT HEART CATH AND CORS/GRAFTS ANGIOGRAPHY
Anesthesia: LOCAL

## 2021-09-19 MED ORDER — HEPARIN SODIUM (PORCINE) 1000 UNIT/ML IJ SOLN
INTRAMUSCULAR | Status: AC
  Filled 2021-09-19: qty 10

## 2021-09-19 MED ORDER — IOHEXOL 350 MG/ML SOLN
INTRAVENOUS | Status: DC | PRN
  Administered 2021-09-19: 75 mL

## 2021-09-19 MED ORDER — LIDOCAINE HCL (PF) 1 % IJ SOLN
INTRAMUSCULAR | Status: AC
  Filled 2021-09-19: qty 30

## 2021-09-19 MED ORDER — FENTANYL CITRATE (PF) 100 MCG/2ML IJ SOLN
INTRAMUSCULAR | Status: DC | PRN
  Administered 2021-09-19: 25 ug via INTRAVENOUS

## 2021-09-19 MED ORDER — FENTANYL CITRATE (PF) 100 MCG/2ML IJ SOLN
INTRAMUSCULAR | Status: AC
  Filled 2021-09-19: qty 2

## 2021-09-19 MED ORDER — SODIUM CHLORIDE 0.9% FLUSH
3.0000 mL | Freq: Two times a day (BID) | INTRAVENOUS | Status: DC
  Administered 2021-09-20 – 2021-09-25 (×7): 3 mL via INTRAVENOUS

## 2021-09-19 MED ORDER — LIDOCAINE HCL (PF) 1 % IJ SOLN
INTRAMUSCULAR | Status: DC | PRN
  Administered 2021-09-19: 12 mL

## 2021-09-19 MED ORDER — HEPARIN (PORCINE) IN NACL 1000-0.9 UT/500ML-% IV SOLN
INTRAVENOUS | Status: DC | PRN
  Administered 2021-09-19 (×2): 500 mL

## 2021-09-19 MED ORDER — MIDAZOLAM HCL 2 MG/2ML IJ SOLN
INTRAMUSCULAR | Status: DC | PRN
  Administered 2021-09-19: 1 mg via INTRAVENOUS

## 2021-09-19 MED ORDER — HYDRALAZINE HCL 20 MG/ML IJ SOLN: 10.0000 mg | Freq: Four times a day (QID) | INTRAMUSCULAR | Status: DC | PRN

## 2021-09-19 MED ORDER — METOPROLOL TARTRATE 25 MG PO TABS
25.0000 mg | ORAL_TABLET | Freq: Two times a day (BID) | ORAL | Status: DC
  Administered 2021-09-19 – 2021-09-20 (×3): 25 mg via ORAL
  Filled 2021-09-19 (×4): qty 1

## 2021-09-19 MED ORDER — SODIUM CHLORIDE 0.9 % IV SOLN: 250.0000 mL | INTRAVENOUS | Status: DC | PRN

## 2021-09-19 MED ORDER — ONDANSETRON HCL 4 MG/2ML IJ SOLN: 4.0000 mg | Freq: Four times a day (QID) | INTRAMUSCULAR | Status: DC | PRN

## 2021-09-19 MED ORDER — HYDRALAZINE HCL 20 MG/ML IJ SOLN
INTRAMUSCULAR | Status: AC
  Filled 2021-09-19: qty 1

## 2021-09-19 MED ORDER — MIDAZOLAM HCL 2 MG/2ML IJ SOLN
INTRAMUSCULAR | Status: AC
  Filled 2021-09-19: qty 2

## 2021-09-19 MED ORDER — LABETALOL HCL 5 MG/ML IV SOLN: 10.0000 mg | INTRAVENOUS | Status: AC | PRN

## 2021-09-19 MED ORDER — HYDRALAZINE HCL 20 MG/ML IJ SOLN
INTRAMUSCULAR | Status: DC | PRN
  Administered 2021-09-19 (×2): 10 mg via INTRAVENOUS

## 2021-09-19 MED ORDER — ASPIRIN 81 MG PO CHEW
81.0000 mg | CHEWABLE_TABLET | Freq: Every day | ORAL | Status: DC
  Administered 2021-09-20 – 2021-09-25 (×6): 81 mg via ORAL
  Filled 2021-09-19 (×6): qty 1

## 2021-09-19 MED ORDER — HEPARIN (PORCINE) IN NACL 1000-0.9 UT/500ML-% IV SOLN
INTRAVENOUS | Status: AC
  Filled 2021-09-19: qty 1000

## 2021-09-19 MED ORDER — HEPARIN SODIUM (PORCINE) 5000 UNIT/ML IJ SOLN
5000.0000 [IU] | Freq: Three times a day (TID) | INTRAMUSCULAR | Status: DC
  Administered 2021-09-19 – 2021-09-25 (×17): 5000 [IU] via SUBCUTANEOUS
  Filled 2021-09-19 (×17): qty 1

## 2021-09-19 MED ORDER — SODIUM CHLORIDE 0.9 % IV SOLN: INTRAVENOUS | Status: AC

## 2021-09-19 MED ORDER — VERAPAMIL HCL 2.5 MG/ML IV SOLN
INTRAVENOUS | Status: AC
  Filled 2021-09-19: qty 2

## 2021-09-19 MED ORDER — METOPROLOL TARTRATE 5 MG/5ML IV SOLN
INTRAVENOUS | Status: AC
  Filled 2021-09-19: qty 5

## 2021-09-19 MED ORDER — METOPROLOL TARTRATE 12.5 MG HALF TABLET: 12.5000 mg | ORAL_TABLET | Freq: Every day | ORAL | Status: DC

## 2021-09-19 MED ORDER — SODIUM CHLORIDE 0.9% FLUSH: 3.0000 mL | INTRAVENOUS | Status: DC | PRN

## 2021-09-19 MED ORDER — ACETAMINOPHEN 325 MG PO TABS: 650.0000 mg | ORAL_TABLET | ORAL | Status: DC | PRN

## 2021-09-19 MED ORDER — HYDRALAZINE HCL 20 MG/ML IJ SOLN: 10.0000 mg | INTRAMUSCULAR | Status: AC | PRN

## 2021-09-19 SURGICAL SUPPLY — 11 items
CATH INFINITI 5 FR IM (CATHETERS) ×1 IMPLANT
CATH INFINITI 5FR JL4 (CATHETERS) ×1 IMPLANT
CATH INFINITI 5FR MPB2 (CATHETERS) ×1 IMPLANT
CATH INFINITI JR4 5F (CATHETERS) ×1 IMPLANT
KIT HEART LEFT (KITS) ×2 IMPLANT
PACK CARDIAC CATHETERIZATION (CUSTOM PROCEDURE TRAY) ×2 IMPLANT
SHEATH PINNACLE 5F 10CM (SHEATH) ×1 IMPLANT
SHEATH PROBE COVER 6X72 (BAG) ×1 IMPLANT
TRANSDUCER W/STOPCOCK (MISCELLANEOUS) ×2 IMPLANT
WIRE EMERALD 3MM-J .035X150CM (WIRE) ×1 IMPLANT
WIRE EMERALD ST .035X260CM (WIRE) ×1 IMPLANT

## 2021-09-19 NOTE — Plan of Care (Signed)

## 2021-09-19 NOTE — Progress Notes (Signed)
° °  Received page that patient was having chest pain and was tachycardic with rates in the 130s.  RN and patient reported that patient suddenly became tachycardic following cath this afternoon with rates in the 130s.  He was initially asymptomatic with this but then began to develop diffuse chest pain/heaviness with bilateral heaviness in his arms and some radiation to his neck.  He initially rated the pain as a 6/10 on the pain scale.  He was given 3 doses of sublingual nitro with some mild improvement to 4/10 on the pain scale. He had some mild labored breathing on exam. EKG showed sinus tachycardia with rates in the 130s with known right bundle branch block but no acute ischemic changes compared to prior tracings.  General: No acute distress. Heart: Tachycardic with regular rhythm. Possible soft murmur but no gallops or rubs. Right femoral cath site soft with no signs of hematoma. Lungs: Clear to ausculation bilaterally. No wheezes, rhonchi, or rales.     Skin: Warm and dry. Neuro: Alert and oriented x3. No focal deficits.  Patient was given 1 dose of IV Lopressor 5mg  and heart rates quickly improved to the 90s to low 100s. As heart rate improved, chest pain completely resolved. Repeat EKG showed normal sinus rhythm, rate 99 bpm, with known RBBB, Q waves in inferior leads, and T wave inversions in leads III. No acute changes from prior EKG tracing. Patient was chest pain free prior to me leaving the room and stated he chest felt tired. Will continue to monitor patient closely on telemetry. Will start Lopressor 25mg  twice daily tonight.  Darreld Mclean, PA-C 09/19/2021 3:06 PM

## 2021-09-19 NOTE — Interval H&P Note (Signed)
Cath Lab Visit (complete for each Cath Lab visit)  Clinical Evaluation Leading to the Procedure:   ACS: Yes.    Non-ACS:    Anginal Classification: CCS II  Anti-ischemic medical therapy: Minimal Therapy (1 class of medications)  Non-Invasive Test Results: No non-invasive testing performed  Prior CABG: Previous CABG      History and Physical Interval Note:  09/19/2021 8:59 AM  Resolved that this procedure would be diagnostic only.  The approach would be right femoral and attempt to minimize contrast exposure given CKD stage IV.  Given creatinine trend, he is at high risk for acute kidney injury and the possible need for dialysis.  This was discussed with the patient.  Also discussed with primary attending earlier this morning.  Kurt Aguilar  has presented today for surgery, with the diagnosis of nstemi.  The various methods of treatment have been discussed with the patient and family. After consideration of risks, benefits and other options for treatment, the patient has consented to  Procedure(s): LEFT HEART CATH AND CORS/GRAFTS ANGIOGRAPHY (N/A) as a surgical intervention.  The patient's history has been reviewed, patient examined, no change in status, stable for surgery.  I have reviewed the patient's chart and labs.  Questions were answered to the patient's satisfaction.     Kurt Aguilar

## 2021-09-19 NOTE — Progress Notes (Signed)
PROGRESS NOTE    Kurt Aguilar  EYC:[id number] DOB: 01-25-1946 DOA: 09/17/2021 PCP: [name], MD   Brief Narrative:  Kurt Aguilar is a 76 y.o. male with medical history significant of CAD s/p CABG in 1997; DM2, HTN, HLD, BOOP, chronic steroid dependence presented to ED with mild intermittent chest pain for 1 month which was worse with exertion and associated with some shortness of breath.  Upon arrival to ED, he was hemodynamically stable however his troponins were elevated.  Admitted under hospitalist service and cardiology consulted.  Assessment & Plan:   Principal Problem:   Acute coronary syndrome (HCC) Active Problems:   Essential hypertension   Right bundle branch block   CAD (coronary artery disease)   DM2 (diabetes mellitus, type 2) (HCC)   Steroid dependent (HCC)   AKI (acute kidney injury) ([place])   Stage 3b chronic kidney disease (CKD) (HCC)  Chest pain/elevated troponin:   Troponin elevated but flat.  Echo with no wall motion rumbly normal ejection fraction.  Underwent left heart catheterization today evidence of multivessel native coronary artery disease, saphenous vein to right coronary artery with 70% ostial to proximal stenosis, SVG to OM 1 and 2 patent, SVG to first diagonal patent, LIMA to LAD patent.  Per cardiology plan at this time is to maximize medical therapy continue aspirin 81 mg daily, Crestor 40 mg daily, will add Imdur 30 mg daily.  They will follow him closely in the outpatient and further determine the need for any revascularization in near future.  His CKD can be the limiting factor.  Hypertension: Controlled.  Continue current regimen.  Sinus tachycardia: Resume home dose of metoprolol.  Lactic acidosis with metabolic acidosis: Source unknown, improving.  Continue IV fluids.  Repeat lactic acid now and BMP tomorrow.  AKI on CKD stage IIIb: Baseline creatinine around 1.9.  Presented with 2.34 which is improving.  He is at risk of  contrast-induced nephropathy this cardiology has recommended to continue on normal saline at 75 cc/h for 24 hours.  Reassess labs in the morning.  Hyperkalemia: Resolved.  Type 2 diabetes mellitus: Elevated yesterday but was normal this morning, his NPH was held for some reason.  I have advised the RN to check blood sugar and if elevated more than 125, give him his NPH.  Continue SSI.  HLD: Continue Crestor.  DVT prophylaxis: heparin injection 5,000 Units Start: 09/19/21 2200 SCD's Start: 09/19/21 1110   Code Status: Full Code  Family Communication:  Wife present at bedside.  Plan of care discussed with patient in length and he/she verbalized understanding and agreed with it.  Status is: Inpatient  Remains inpatient appropriate because: Needs cardiac cath.  Estimated body mass index is 32.14 kg/m as calculated from the following:   Height as of this encounter: 5\' 10"  (1.778 m).   Weight as of this encounter: 101.6 kg.    Nutritional Assessment: Body mass index is 32.14 kg/m.Marland Kitchen Seen by dietician.  I agree with the assessment and plan as outlined below: Nutrition Status:        . Skin Assessment: I have examined the patient's skin and I agree with the wound assessment as performed by the wound care RN as outlined below:    Consultants:  Cardiology  Procedures:  None  Antimicrobials:  Anti-infectives (From admission, onward)    None         Subjective: In and examined.  Wife at the bedside.  Patient has no complaints now but he had  several questions regarding why he had symptoms.  He later discussed with cardiology and received his answers.  Objective: Vitals:   09/19/21 1100 09/19/21 1122 09/19/21 1145 09/19/21 1200  BP: (!) 147/73 128/86 (!) 145/89 139/90  Pulse: (!) 104  (!) 107 (!) 110  Resp: 14 14 15 15   Temp:  97.6 F (36.4 C)    TempSrc:      SpO2: 98% 96% 96% 96%  Weight:      Height:        Intake/Output Summary (Last 24 hours) at 09/19/2021  1407 Last data filed at 09/19/2021 1136 Gross per 24 hour  Intake 643.8 ml  Output 400 ml  Net 243.8 ml    Filed Weights   09/18/21 0304 09/19/21 0438  Weight: 102.2 kg 101.6 kg    Examination:  General exam: Appears calm and comfortable  Respiratory system: Clear to auscultation. Respiratory effort normal. Cardiovascular system: S1 & S2 heard, RRR. No JVD, murmurs, rubs, gallops or clicks. No pedal edema. Gastrointestinal system: Abdomen is nondistended, soft and nontender. No organomegaly or masses felt. Normal bowel sounds heard. Central nervous system: Alert and oriented. No focal neurological deficits. Extremities: Symmetric 5 x 5 power. Skin: No rashes, lesions or ulcers.  Psychiatry: Judgement and insight appear normal. Mood & affect appropriate.   Data Reviewed: I have personally reviewed following labs and imaging studies  CBC: Recent Labs  Lab 09/17/21 1247 09/17/21 1856 09/18/21 0606 09/19/21 0147  WBC 13.7*  --  11.4* 11.0*  HGB 14.3 11.9* 13.2 12.2*  HCT 44.0 35.0* 38.4* 38.1*  MCV 100.5*  --  96.5 99.7  PLT 172  --  135* 127*    Basic Metabolic Panel: Recent Labs  Lab 09/17/21 1247 09/17/21 1856 09/18/21 0606 09/19/21 0147  NA 137 138 136 137  K 5.4* 4.6 4.6 4.4  CL 104  --  108 108  CO2 15*  --  18* 16*  GLUCOSE 268*  --  165* 204*  BUN 60*  --  50* 41*  CREATININE 2.34*  --  1.95* 2.15*  CALCIUM 9.6  --  8.9 8.6*    GFR: Estimated Creatinine Clearance: 34.3 mL/min (A) (by C-G formula based on SCr of 2.15 mg/dL (H)). Liver Function Tests: Recent Labs  Lab 09/17/21 2323  AST 25  ALT 36  ALKPHOS 33*  BILITOT 0.8  PROT 5.3*  ALBUMIN 3.0*    No results for input(s): LIPASE, AMYLASE in the last 168 hours. No results for input(s): AMMONIA in the last 168 hours. Coagulation Profile: No results for input(s): INR, PROTIME in the last 168 hours. Cardiac Enzymes: No results for input(s): CKTOTAL, CKMB, CKMBINDEX, TROPONINI in the last 168  hours. BNP (last 3 results) No results for input(s): PROBNP in the last 8760 hours. HbA1C: No results for input(s): HGBA1C in the last 72 hours. CBG: Recent Labs  Lab 09/18/21 1113 09/18/21 1659 09/18/21 2120 09/19/21 0606 09/19/21 1033  GLUCAP 179* 144* 253* 175* 99    Lipid Profile: No results for input(s): CHOL, HDL, LDLCALC, TRIG, CHOLHDL, LDLDIRECT in the last 72 hours. Thyroid Function Tests: No results for input(s): TSH, T4TOTAL, FREET4, T3FREE, THYROIDAB in the last 72 hours. Anemia Panel: No results for input(s): VITAMINB12, FOLATE, FERRITIN, TIBC, IRON, RETICCTPCT in the last 72 hours. Sepsis Labs: Recent Labs  Lab 09/17/21 2018 09/18/21 0606 09/18/21 0903 09/18/21 1456  PROCALCITON  --  <0.10  --   --   LATICACIDVEN 3.4* 2.7* 3.3* 2.1*  Recent Results (from the past 240 hour(s))  Resp Panel by RT-PCR (Flu A&B, Covid) Nasopharyngeal Swab     Status: None   Collection Time: 09/17/21  5:09 PM   Specimen: Nasopharyngeal Swab; Nasopharyngeal(NP) swabs in vial transport medium  Result Value Ref Range Status   SARS Coronavirus 2 by RT PCR NEGATIVE NEGATIVE Final    Comment: (NOTE) SARS-CoV-2 target nucleic acids are NOT DETECTED.  The SARS-CoV-2 RNA is generally detectable in upper respiratory specimens during the acute phase of infection. The lowest concentration of SARS-CoV-2 viral copies this assay can detect is 138 copies/mL. A negative result does not preclude SARS-Cov-2 infection and should not be used as the sole basis for treatment or other patient management decisions. A negative result may occur with  improper specimen collection/handling, submission of specimen other than nasopharyngeal swab, presence of viral mutation(s) within the areas targeted by this assay, and inadequate number of viral copies(<138 copies/mL). A negative result must be combined with clinical observations, patient history, and epidemiological information. The expected  result is Negative.  Fact Sheet for Patients:  EntrepreneurPulse.com.au  Fact Sheet for Healthcare Providers:  IncredibleEmployment.be  This test is no t yet approved or cleared by the [place] FDA and  has been authorized for detection and/or diagnosis of SARS-CoV-2 by FDA under an Emergency Use Authorization (EUA). This EUA will remain  in effect (meaning this test can be used) for the duration of the COVID-19 declaration under Section 564(b)(1) of the Act, 21 U.S.C.section 360bbb-3(b)(1), unless the authorization is terminated  or revoked sooner.       Influenza A by PCR NEGATIVE NEGATIVE Final   Influenza B by PCR NEGATIVE NEGATIVE Final    Comment: (NOTE) The Xpert Xpress SARS-CoV-2/FLU/RSV plus assay is intended as an aid in the diagnosis of influenza from Nasopharyngeal swab specimens and should not be used as a sole basis for treatment. Nasal washings and aspirates are unacceptable for Xpert Xpress SARS-CoV-2/FLU/RSV testing.  Fact Sheet for Patients: EntrepreneurPulse.com.au  Fact Sheet for Healthcare Providers: IncredibleEmployment.be  This test is not yet approved or cleared by the [place] FDA and has been authorized for detection and/or diagnosis of SARS-CoV-2 by FDA under an Emergency Use Authorization (EUA). This EUA will remain in effect (meaning this test can be used) for the duration of the COVID-19 declaration under Section 564(b)(1) of the Act, 21 U.S.C. section 360bbb-3(b)(1), unless the authorization is terminated or revoked.  Performed at [facility], [place] [address] [place], [place] [address]   MRSA Next Gen by PCR, Nasal     Status: None   Collection Time: 09/18/21  3:35 AM   Specimen: Nasal Mucosa; Nasal Swab  Result Value Ref Range Status   MRSA by PCR Next Gen NOT DETECTED NOT DETECTED Final    Comment: (NOTE) The GeneXpert MRSA Assay (FDA approved for  NASAL specimens only), is one component of a comprehensive MRSA colonization surveillance program. It is not intended to diagnose MRSA infection nor to guide or monitor treatment for MRSA infections. Test performance is not FDA approved in patients less than 22 years old. Performed at [facility], [place] [address], [place], [place] [address]   Culture, blood (routine x 2)     Status: None (Preliminary result)   Collection Time: 09/18/21  6:06 AM   Specimen: BLOOD RIGHT FOREARM  Result Value Ref Range Status   Specimen Description BLOOD RIGHT FOREARM  Final   Special Requests   Final  BOTTLES DRAWN AEROBIC AND ANAEROBIC Blood Culture adequate volume   Culture   Final    NO GROWTH 1 DAY Performed at [facility], [place] [address], [place], [place] [address]    Report Status PENDING  Incomplete  Culture, blood (routine x 2)     Status: None (Preliminary result)   Collection Time: 09/18/21  6:11 AM   Specimen: BLOOD RIGHT HAND  Result Value Ref Range Status   Specimen Description BLOOD RIGHT HAND  Final   Special Requests   Final    BOTTLES DRAWN AEROBIC AND ANAEROBIC Blood Culture adequate volume   Culture   Final    NO GROWTH 1 DAY Performed at [facility], [place] [address], [place],  [address]    Report Status PENDING  Incomplete      Radiology Studies: NM Pulmonary Perfusion  Result Date: 09/17/2021 CLINICAL DATA:  Pulmonary embolism (PE) suspected, high prob. Shortness of breath, chest pain EXAM: NUCLEAR MEDICINE PERFUSION LUNG SCAN TECHNIQUE: Perfusion images were obtained in multiple projections after intravenous injection of radiopharmaceutical. Ventilation scans intentionally deferred if perfusion scan and chest x-ray adequate for interpretation during COVID 19 epidemic. RADIOPHARMACEUTICALS:  4.3 mCi Tc-39m MAA IV COMPARISON:  Chest x-ray today FINDINGS: No perfusion defects to suggest pulmonary embolus. IMPRESSION: No evidence of pulmonary embolus.  Electronically Signed   By: Rolm Baptise M.D.   On: 09/17/2021 20:06   CARDIAC CATHETERIZATION  Result Date: 09/19/2021 CONCLUSIONS: Severe diffuse calcified coronary disease of the native arteries with 100% proximal RCA, 90% ostial circumflex, 99% distal left main, severely and diffusely diseased proximal to mid LAD with threatened septal perforator. Diffusely diseased saphenous vein graft to the right coronary.  Ostial to proximal 70% stenosis. Widely patent sequential graft to first and second obtuse marginal Widely patent saphenous vein graft to the first diagonal Widely patent LIMA to the mid LAD Aortic valve is heavily calcified, was difficult to cross, but without significant gradient.  LVEDP 10 mmHg. Total contrast 70 cc RECOMMENDATIONS: Continue IV fluids for 18 to 24 hours.  He was given an additional 250 cc of saline as a bolus after identifying that LVEDP was 9 mmHg. Monitor kidney function closely.  Contrast exposure was 70 cc. The saphenous vein graft to the right coronary can be stented although it is not so critical that it should be causing enzyme elevation.  Enzyme elevation likely related to left coronary native disease in the septal perforator territory.   ECHOCARDIOGRAM COMPLETE  Result Date: 09/18/2021    ECHOCARDIOGRAM REPORT   Patient Name:   Kurt Aguilar [facility] Date of Exam: 09/18/2021 Medical Rec #:  [id number]        Height:       70.0 in Accession #:    [id number]       Weight:       225.3 lb Date of Birth:  Jan 02, 1946        BSA:          2.196 m Patient Age:    62 years         BP:           165/90 mmHg Patient Gender: M                HR:           97 bpm. Exam Location:  Inpatient Procedure: 2D Echo, Cardiac Doppler, Color Doppler and Intracardiac  Opacification Agent Indications:    Acute myocardial infarction  History:        Patient has prior history of Echocardiogram examinations, most                 recent 05/22/2009. Prior CABG; Risk Factors:Diabetes and                  Hypertension. CKD. GERD.  Sonographer:    [name] Lefort RDCS (AE) Referring Phys: [phone] JARED M GARDNER  Sonographer Comments: Technically difficult study due to poor echo windows and no subcostal window. IMPRESSIONS  1. Left ventricular ejection fraction, by estimation, is 60 to 65%. The left ventricle has normal function. The left ventricle has no regional wall motion abnormalities. Left ventricular diastolic parameters are consistent with Grade I diastolic dysfunction (impaired relaxation). Elevated left ventricular end-diastolic pressure.  2. Right ventricular systolic function is moderately reduced. The right ventricular size is normal.  3. The mitral valve is normal in structure. No evidence of mitral valve regurgitation. No evidence of mitral stenosis.  4. The AV is poorly visualized to comment on structure. There does appear to be come calcification of the valve. . The aortic valve was not well visualized. Aortic valve regurgitation is not visualized. No aortic stenosis is present. FINDINGS  Left Ventricle: Left ventricular ejection fraction, by estimation, is 60 to 65%. The left ventricle has normal function. The left ventricle has no regional wall motion abnormalities. Definity contrast agent was given IV to delineate the left ventricular  endocardial borders. The left ventricular internal cavity size was normal in size. There is no left ventricular hypertrophy. Left ventricular diastolic parameters are consistent with Grade I diastolic dysfunction (impaired relaxation). Elevated left ventricular end-diastolic pressure. Right Ventricle: The right ventricular size is normal. No increase in right ventricular wall thickness. Right ventricular systolic function is moderately reduced. Left Atrium: Left atrial size was normal in size. Right Atrium: Right atrial size was normal in size. Pericardium: There is no evidence of pericardial effusion. Mitral Valve: The mitral valve is normal in structure. No evidence of  mitral valve regurgitation. No evidence of mitral valve stenosis. Tricuspid Valve: The tricuspid valve is normal in structure. Tricuspid valve regurgitation is mild . No evidence of tricuspid stenosis. Aortic Valve: The AV is poorly visualized to comment on structure. There does appear to be come calcification of the valve. The aortic valve was not well visualized. Aortic valve regurgitation is not visualized. No aortic stenosis is present. Aortic valve mean gradient measures 3.0 mmHg. Aortic valve peak gradient measures 4.8 mmHg. Aortic valve area, by VTI measures 2.47 cm. Pulmonic Valve: The pulmonic valve was normal in structure. Pulmonic valve regurgitation is not visualized. No evidence of pulmonic stenosis. Aorta: The aortic root is normal in size and structure. Venous: The inferior vena cava was not well visualized. IAS/Shunts: No atrial level shunt detected by color flow Doppler.  LEFT VENTRICLE PLAX 2D LVOT diam:     1.90 cm   Diastology LV SV:         52        LV e' medial:    5.66 cm/s LV SV Index:   24        LV E/e' medial:  14.8 LVOT Area:     2.84 cm  LV e' lateral:   6.09 cm/s                          LV E/e'  lateral: 13.7  RIGHT VENTRICLE RV S prime:     10.30 cm/s TAPSE (M-mode): 1.3 cm LEFT ATRIUM             Index        RIGHT ATRIUM           Index LA Vol (A2C):   37.4 ml 17.03 ml/m  RA Area:     14.40 cm LA Vol (A4C):   69.8 ml 31.79 ml/m  RA Volume:   31.30 ml  14.25 ml/m LA Biplane Vol: 54.3 ml 24.73 ml/m  AORTIC VALVE AV Area (Vmax):    2.51 cm AV Area (Vmean):   2.75 cm AV Area (VTI):     2.47 cm AV Vmax:           110.00 cm/s AV Vmean:          77.800 cm/s AV VTI:            0.210 m AV Peak Grad:      4.8 mmHg AV Mean Grad:      3.0 mmHg LVOT Vmax:         97.50 cm/s LVOT Vmean:        75.400 cm/s LVOT VTI:          0.183 m LVOT/AV VTI ratio: 0.87  AORTA Ao Root diam: 2.70 cm Ao Asc diam:  2.90 cm MITRAL VALVE                TRICUSPID VALVE MV Area (PHT): 2.05 cm     TR Peak  grad:   18.5 mmHg MV Decel Time: 370 msec     TR Vmax:        215.00 cm/s MV E velocity: 83.70 cm/s MV A velocity: 163.00 cm/s  SHUNTS MV E/A ratio:  0.51         Systemic VTI:  0.18 m                             Systemic Diam: 1.90 cm Fransico Him MD Electronically signed by Fransico Him MD Signature Date/Time: 09/18/2021/11:30:29 AM    Final    VAS [place] LOWER EXTREMITY VENOUS (DVT) (ONLY [facility] & [facility])  Result Date: 09/17/2021  Lower Venous DVT Study Patient Name:  Kurt Aguilar [facility]  Date of Exam:   09/17/2021 Medical Rec #: [id number]         Accession #:    [id number] Date of Birth: [birth year]         Patient Gender: M Patient Age:   25 years Exam [facility]  [facility] Procedure:      VAS [place] LOWER EXTREMITY VENOUS (DVT) Referring Phys: BRITNI HENDERLY --------------------------------------------------------------------------------  Indications: Edema.  Comparison Study: No prior study Performing Technologist: Maudry Mayhew MHA, RDMS, RVT, RDCS  Examination Guidelines: A complete evaluation includes B-mode imaging, spectral Doppler, color Doppler, and power Doppler as needed of all accessible portions of each vessel. Bilateral testing is considered an integral part of a complete examination. Limited examinations for reoccurring indications may be performed as noted. The reflux portion of the exam is performed with the patient in reverse Trendelenburg.  +---------+---------------+---------+-----------+----------+--------------+  RIGHT     Compressibility Phasicity Spontaneity Properties Thrombus Aging  +---------+---------------+---------+-----------+----------+--------------+  CFV       Full            Yes       Yes                                    +---------+---------------+---------+-----------+----------+--------------+  SFJ       Full                                                             +---------+---------------+---------+-----------+----------+--------------+  FV Prox   Full                                                              +---------+---------------+---------+-----------+----------+--------------+  FV Mid    Full                                                             +---------+---------------+---------+-----------+----------+--------------+  FV Distal Full                                                             +---------+---------------+---------+-----------+----------+--------------+  PFV       Full                                                             +---------+---------------+---------+-----------+----------+--------------+  POP       Full            Yes       Yes                                    +---------+---------------+---------+-----------+----------+--------------+  PTV       Full                                                             +---------+---------------+---------+-----------+----------+--------------+  PERO      Full                                                             +---------+---------------+---------+-----------+----------+--------------+   +---------+---------------+---------+-----------+----------+--------------+  LEFT      Compressibility Phasicity Spontaneity Properties Thrombus Aging  +---------+---------------+---------+-----------+----------+--------------+  CFV       Full            Yes       Yes                                    +---------+---------------+---------+-----------+----------+--------------+  SFJ       Full                                                             +---------+---------------+---------+-----------+----------+--------------+  FV Prox   Full                                                             +---------+---------------+---------+-----------+----------+--------------+  FV Mid    Full                                                             +---------+---------------+---------+-----------+----------+--------------+  FV Distal Full                                                              +---------+---------------+---------+-----------+----------+--------------+  PFV       Full                                                             +---------+---------------+---------+-----------+----------+--------------+  POP       Full            Yes       Yes                                    +---------+---------------+---------+-----------+----------+--------------+  PTV       Full                                                             +---------+---------------+---------+-----------+----------+--------------+  PERO      Full                                                             +---------+---------------+---------+-----------+----------+--------------+     Summary: RIGHT: - There is no evidence of deep vein thrombosis in the lower extremity.  - No cystic structure found in the popliteal fossa.  LEFT: - There is no evidence of deep vein thrombosis in the lower extremity.  - No cystic structure found in the popliteal fossa.  *See table(s)  above for measurements and observations. Electronically signed by Harold Barban MD on 09/17/2021 at 9:42:23 PM.    Final     Scheduled Meds:  aspirin  81 mg Oral Daily   ezetimibe  10 mg Oral Daily   famotidine  20 mg Oral QHS   heparin  5,000 Units Subcutaneous Q8H   insulin aspart  0-15 Units Subcutaneous TID WC   insulin aspart  0-5 Units Subcutaneous QHS   insulin NPH Human  15 Units Subcutaneous QAC breakfast   pantoprazole  80 mg Oral BID AC   predniSONE  10 mg Oral BID WC   rosuvastatin  40 mg Oral Daily   sodium chloride flush  3 mL Intravenous Q12H   sodium chloride flush  3 mL Intravenous Q12H   sodium chloride flush  3 mL Intravenous Q12H   Continuous Infusions:  sodium chloride 75 mL/hr at 09/19/21 1010   sodium chloride       LOS: 2 days   Time spent: 30-minute  Darliss Cheney, MD [facility]  09/19/2021, 2:07 PM  Please page via [place] and do not message via secure chat for anything urgent. Secure chat can be used  for anything non urgent.  How to contact the [facility] Attending or Consulting provider [place] or covering provider during after hours [place], for this patient?  Check the care team in [facility] and look for a) attending/consulting [facility] provider listed and b) the [facility] team listed. Page or secure chat [phone]. Log into [email or web address] and use [place]'s universal password to access. If you do not have the password, please contact the hospital operator. Locate the [facility] provider you are looking for under [facility] and page to a number that you can be directly reached. If you still have difficulty reaching the provider, please page the [facility] (Director on Call) for the Hospitalists listed on amion for assistance.

## 2021-09-19 NOTE — Progress Notes (Signed)
SITE AREA: right groin/femoral  SITE PRIOR TO REMOVAL:  LEVEL 0  PRESSURE APPLIED FOR: approximately 20 minutes  MANUAL: yes  PATIENT STATUS DURING PULL: stable  POST PULL SITE:  LEVEL 0  POST PULL INSTRUCTIONS GIVEN: yes  POST PULL PULSES PRESENT: +2 right tibial pulse palpable  DRESSING APPLIED: gauze with tegaderm  BEDREST BEGINS @ 1059  COMMENTS:

## 2021-09-19 NOTE — Progress Notes (Signed)
ANTICOAGULATION CONSULT NOTE   Pharmacy Consult for IV heparin Indication: chest pain/ACS  Allergies  Allergen Reactions   Oxycodone-Acetaminophen Other (See Comments)    Stops breathing; has tolerated Tylenol before    Patient Measurements: Height: 5\' 10"  (177.8 cm) Weight: 101.6 kg (223 lb 15.8 oz) IBW/kg (Calculated) : 73 Heparin Dosing Weight: 94.77 kg  Vital Signs: Temp: 97.5 F (36.4 C) (01/12 0438) Temp Source: Oral (01/12 0438) BP: 150/86 (01/12 0438) Pulse Rate: 68 (01/12 0438)  Labs: Recent Labs    09/17/21 1247 09/17/21 1731 09/17/21 1856 09/17/21 1856 09/18/21 0606 09/18/21 1014 09/18/21 1956 09/19/21 0147 09/19/21 0701  HGB 14.3  --  11.9*  --  13.2  --   --  12.2*  --   HCT 44.0  --  35.0*  --  38.4*  --   --  38.1*  --   PLT 172  --   --   --  135*  --   --  127*  --   HEPARINUNFRC  --   --   --    < > >1.10* >1.10* >1.10*  --  0.59  CREATININE 2.34*  --   --   --  1.95*  --   --  2.15*  --   TROPONINIHS 250* 195*  --   --   --   --   --   --   --    < > = values in this interval not displayed.     Estimated Creatinine Clearance: 34.3 mL/min (A) (by C-G formula based on SCr of 2.15 mg/dL (H)).   Assessment: 89 YOM presenting to the ED with chest pain. No history of anticoagulation prior to admission. Pharmacy to dose IV heparin.   Heparin level now at goal 0.59. No bleeding issues noted overnight. Planning cath today.   Goal of Therapy:  Heparin level 0.3-0.7 units/ml Monitor platelets by anticoagulation protocol: Yes   Plan:  Continue heparin at 650 units/hr until cath Follow up after cath  Erin Hearing PharmD., BCPS Clinical Pharmacist 09/19/2021 7:30 AM

## 2021-09-19 NOTE — Progress Notes (Signed)
Progress Note  Patient Name: Kurt Aguilar Date of Encounter: 09/19/2021  Primary Cardiologist: None   Subjective   Patient was seen and examined at his bedside. His wife was at the bedside.  He offers no complaints at this time. He had a few questions.    Inpatient Medications    Scheduled Meds:  aspirin  81 mg Oral Daily   ezetimibe  10 mg Oral Daily   famotidine  20 mg Oral QHS   heparin  5,000 Units Subcutaneous Q8H   insulin aspart  0-15 Units Subcutaneous TID WC   insulin aspart  0-5 Units Subcutaneous QHS   insulin NPH Human  15 Units Subcutaneous QAC breakfast   pantoprazole  80 mg Oral BID AC   predniSONE  10 mg Oral BID WC   rosuvastatin  40 mg Oral Daily   sodium chloride flush  3 mL Intravenous Q12H   sodium chloride flush  3 mL Intravenous Q12H   sodium chloride flush  3 mL Intravenous Q12H   Continuous Infusions:  sodium chloride 75 mL/hr at 09/19/21 1010   sodium chloride     PRN Meds: sodium chloride, acetaminophen, acetaminophen, hydrALAZINE, hydrALAZINE, labetalol, nitroGLYCERIN, ondansetron (ZOFRAN) IV, ondansetron (ZOFRAN) IV, sodium chloride flush   Vital Signs    Vitals:   09/19/21 1100 09/19/21 1122 09/19/21 1145 09/19/21 1200  BP: (!) 147/73 128/86 (!) 145/89 139/90  Pulse: (!) 104  (!) 107 (!) 110  Resp: 14 14 15 15   Temp:  97.6 F (36.4 C)    TempSrc:      SpO2: 98% 96% 96% 96%  Weight:      Height:        Intake/Output Summary (Last 24 hours) at 09/19/2021 1346 Last data filed at 09/19/2021 1136 Gross per 24 hour  Intake 643.8 ml  Output 400 ml  Net 243.8 ml   Filed Weights   09/18/21 0304 09/19/21 0438  Weight: 102.2 kg 101.6 kg    Telemetry    Sinus rhythm- Personally Reviewed  ECG    None today- Personally Reviewed  Physical Exam    General: Comfortable Head: Atraumatic, normal size  Eyes: PEERLA, EOMI  Neck: Supple, normal JVD Cardiac: Normal S1, S2; RRR; no murmurs, rubs, or gallops Lungs: Clear to  auscultation bilaterally Abd: Soft, nontender, no hepatomegaly  Ext: warm, no edema Musculoskeletal: No deformities, BUE and BLE strength normal and equal Skin: Warm and dry, no rashes   Neuro: Alert and oriented to person, place, time, and situation, CNII-XII grossly intact, no focal deficits  Psych: Normal mood and affect   Labs    Chemistry Recent Labs  Lab 09/17/21 1247 09/17/21 1856 09/17/21 2323 09/18/21 0606 09/19/21 0147  NA 137 138  --  136 137  K 5.4* 4.6  --  4.6 4.4  CL 104  --   --  108 108  CO2 15*  --   --  18* 16*  GLUCOSE 268*  --   --  165* 204*  BUN 60*  --   --  50* 41*  CREATININE 2.34*  --   --  1.95* 2.15*  CALCIUM 9.6  --   --  8.9 8.6*  PROT  --   --  5.3*  --   --   ALBUMIN  --   --  3.0*  --   --   AST  --   --  25  --   --   ALT  --   --  36  --   --   ALKPHOS  --   --  33*  --   --   BILITOT  --   --  0.8  --   --   GFRNONAA 28*  --   --  35* 31*  ANIONGAP 18*  --   --  10 13     Hematology Recent Labs  Lab 09/17/21 1247 09/17/21 1856 09/18/21 0606 09/19/21 0147  WBC 13.7*  --  11.4* 11.0*  RBC 4.38  --  3.98* 3.82*  HGB 14.3 11.9* 13.2 12.2*  HCT 44.0 35.0* 38.4* 38.1*  MCV 100.5*  --  96.5 99.7  MCH 32.6  --  33.2 31.9  MCHC 32.5  --  34.4 32.0  RDW 13.7  --  13.6 13.5  PLT 172  --  135* 127*    Cardiac EnzymesNo results for input(s): TROPONINI in the last 168 hours. No results for input(s): TROPIPOC in the last 168 hours.   BNP Recent Labs  Lab 09/17/21 1731  BNP 37.7     DDimer  Recent Labs  Lab 09/17/21 1731  DDIMER 0.34     Radiology    NM Pulmonary Perfusion  Result Date: 09/17/2021 CLINICAL DATA:  Pulmonary embolism (PE) suspected, high prob. Shortness of breath, chest pain EXAM: NUCLEAR MEDICINE PERFUSION LUNG SCAN TECHNIQUE: Perfusion images were obtained in multiple projections after intravenous injection of radiopharmaceutical. Ventilation scans intentionally deferred if perfusion scan and chest x-ray  adequate for interpretation during COVID 19 epidemic. RADIOPHARMACEUTICALS:  4.3 mCi Tc-67m MAA IV COMPARISON:  Chest x-ray today FINDINGS: No perfusion defects to suggest pulmonary embolus. IMPRESSION: No evidence of pulmonary embolus. Electronically Signed   By: Rolm Baptise M.D.   On: 09/17/2021 20:06   CARDIAC CATHETERIZATION  Result Date: 09/19/2021 CONCLUSIONS: Severe diffuse calcified coronary disease of the native arteries with 100% proximal RCA, 90% ostial circumflex, 99% distal left main, severely and diffusely diseased proximal to mid LAD with threatened septal perforator. Diffusely diseased saphenous vein graft to the right coronary.  Ostial to proximal 70% stenosis. Widely patent sequential graft to first and second obtuse marginal Widely patent saphenous vein graft to the first diagonal Widely patent LIMA to the mid LAD Aortic valve is heavily calcified, was difficult to cross, but without significant gradient.  LVEDP 10 mmHg. Total contrast 70 cc RECOMMENDATIONS: Continue IV fluids for 18 to 24 hours.  He was given an additional 250 cc of saline as a bolus after identifying that LVEDP was 9 mmHg. Monitor kidney function closely.  Contrast exposure was 70 cc. The saphenous vein graft to the right coronary can be stented although it is not so critical that it should be causing enzyme elevation.  Enzyme elevation likely related to left coronary native disease in the septal perforator territory.   ECHOCARDIOGRAM COMPLETE  Result Date: 09/18/2021    ECHOCARDIOGRAM REPORT   Patient Name:   Kurt Aguilar [facility] Date of Exam: 09/18/2021 Medical Rec #:  [id number]        Height:       70.0 in Accession #:    [id number]       Weight:       225.3 lb Date of Birth:  12/28/1945        BSA:          2.196 m Patient Age:    76 years         BP:  165/90 mmHg Patient Gender: M                HR:           97 bpm. Exam Location:  Inpatient Procedure: 2D Echo, Cardiac Doppler, Color Doppler and Intracardiac             Opacification Agent Indications:    Acute myocardial infarction  History:        Patient has prior history of Echocardiogram examinations, most                 recent 05/22/2009. Prior CABG; Risk Factors:Diabetes and                 Hypertension. CKD. GERD.  Sonographer:    [name] Lefort RDCS (AE) Referring Phys: [phone] JARED M GARDNER  Sonographer Comments: Technically difficult study due to poor echo windows and no subcostal window. IMPRESSIONS  1. Left ventricular ejection fraction, by estimation, is 60 to 65%. The left ventricle has normal function. The left ventricle has no regional wall motion abnormalities. Left ventricular diastolic parameters are consistent with Grade I diastolic dysfunction (impaired relaxation). Elevated left ventricular end-diastolic pressure.  2. Right ventricular systolic function is moderately reduced. The right ventricular size is normal.  3. The mitral valve is normal in structure. No evidence of mitral valve regurgitation. No evidence of mitral stenosis.  4. The AV is poorly visualized to comment on structure. There does appear to be come calcification of the valve. . The aortic valve was not well visualized. Aortic valve regurgitation is not visualized. No aortic stenosis is present. FINDINGS  Left Ventricle: Left ventricular ejection fraction, by estimation, is 60 to 65%. The left ventricle has normal function. The left ventricle has no regional wall motion abnormalities. Definity contrast agent was given IV to delineate the left ventricular  endocardial borders. The left ventricular internal cavity size was normal in size. There is no left ventricular hypertrophy. Left ventricular diastolic parameters are consistent with Grade I diastolic dysfunction (impaired relaxation). Elevated left ventricular end-diastolic pressure. Right Ventricle: The right ventricular size is normal. No increase in right ventricular wall thickness. Right ventricular systolic function is moderately  reduced. Left Atrium: Left atrial size was normal in size. Right Atrium: Right atrial size was normal in size. Pericardium: There is no evidence of pericardial effusion. Mitral Valve: The mitral valve is normal in structure. No evidence of mitral valve regurgitation. No evidence of mitral valve stenosis. Tricuspid Valve: The tricuspid valve is normal in structure. Tricuspid valve regurgitation is mild . No evidence of tricuspid stenosis. Aortic Valve: The AV is poorly visualized to comment on structure. There does appear to be come calcification of the valve. The aortic valve was not well visualized. Aortic valve regurgitation is not visualized. No aortic stenosis is present. Aortic valve mean gradient measures 3.0 mmHg. Aortic valve peak gradient measures 4.8 mmHg. Aortic valve area, by VTI measures 2.47 cm. Pulmonic Valve: The pulmonic valve was normal in structure. Pulmonic valve regurgitation is not visualized. No evidence of pulmonic stenosis. Aorta: The aortic root is normal in size and structure. Venous: The inferior vena cava was not well visualized. IAS/Shunts: No atrial level shunt detected by color flow Doppler.  LEFT VENTRICLE PLAX 2D LVOT diam:     1.90 cm   Diastology LV SV:         52        LV e' medial:    5.66 cm/s LV SV Index:  24        LV E/e' medial:  14.8 LVOT Area:     2.84 cm  LV e' lateral:   6.09 cm/s                          LV E/e' lateral: 13.7  RIGHT VENTRICLE RV S prime:     10.30 cm/s TAPSE (M-mode): 1.3 cm LEFT ATRIUM             Index        RIGHT ATRIUM           Index LA Vol (A2C):   37.4 ml 17.03 ml/m  RA Area:     14.40 cm LA Vol (A4C):   69.8 ml 31.79 ml/m  RA Volume:   31.30 ml  14.25 ml/m LA Biplane Vol: 54.3 ml 24.73 ml/m  AORTIC VALVE AV Area (Vmax):    2.51 cm AV Area (Vmean):   2.75 cm AV Area (VTI):     2.47 cm AV Vmax:           110.00 cm/s AV Vmean:          77.800 cm/s AV VTI:            0.210 m AV Peak Grad:      4.8 mmHg AV Mean Grad:      3.0 mmHg LVOT  Vmax:         97.50 cm/s LVOT Vmean:        75.400 cm/s LVOT VTI:          0.183 m LVOT/AV VTI ratio: 0.87  AORTA Ao Root diam: 2.70 cm Ao Asc diam:  2.90 cm MITRAL VALVE                TRICUSPID VALVE MV Area (PHT): 2.05 cm     TR Peak grad:   18.5 mmHg MV Decel Time: 370 msec     TR Vmax:        215.00 cm/s MV E velocity: 83.70 cm/s MV A velocity: 163.00 cm/s  SHUNTS MV E/A ratio:  0.51         Systemic VTI:  0.18 m                             Systemic Diam: 1.90 cm Fransico Him MD Electronically signed by Fransico Him MD Signature Date/Time: 09/18/2021/11:30:29 AM    Final    VAS [place] LOWER EXTREMITY VENOUS (DVT) (ONLY [facility] & [facility])  Result Date: 09/17/2021  Lower Venous DVT Study Patient Name:  CRIT OBREMSKI [facility]  Date of Exam:   09/17/2021 Medical Rec #: [id number]         Accession #:    [id number] Date of Birth: [birth year]         Patient Gender: M Patient Age:   76 years Exam Location:  [facility] Procedure:      VAS [place] LOWER EXTREMITY VENOUS (DVT) Referring Phys: BRITNI HENDERLY --------------------------------------------------------------------------------  Indications: Edema.  Comparison Study: No prior study Performing Technologist: Maudry Mayhew MHA, RDMS, RVT, RDCS  Examination Guidelines: A complete evaluation includes B-mode imaging, spectral Doppler, color Doppler, and power Doppler as needed of all accessible portions of each vessel. Bilateral testing is considered an integral part of a complete examination. Limited examinations for reoccurring indications may be performed as noted. The reflux portion of the exam is performed with the  patient in reverse Trendelenburg.  +---------+---------------+---------+-----------+----------+--------------+  RIGHT     Compressibility Phasicity Spontaneity Properties Thrombus Aging  +---------+---------------+---------+-----------+----------+--------------+  CFV       Full            Yes       Yes                                     +---------+---------------+---------+-----------+----------+--------------+  SFJ       Full                                                             +---------+---------------+---------+-----------+----------+--------------+  FV Prox   Full                                                             +---------+---------------+---------+-----------+----------+--------------+  FV Mid    Full                                                             +---------+---------------+---------+-----------+----------+--------------+  FV Distal Full                                                             +---------+---------------+---------+-----------+----------+--------------+  PFV       Full                                                             +---------+---------------+---------+-----------+----------+--------------+  POP       Full            Yes       Yes                                    +---------+---------------+---------+-----------+----------+--------------+  PTV       Full                                                             +---------+---------------+---------+-----------+----------+--------------+  PERO      Full                                                             +---------+---------------+---------+-----------+----------+--------------+   +---------+---------------+---------+-----------+----------+--------------+  LEFT      Compressibility Phasicity Spontaneity Properties Thrombus Aging  +---------+---------------+---------+-----------+----------+--------------+  CFV       Full            Yes       Yes                                    +---------+---------------+---------+-----------+----------+--------------+  SFJ       Full                                                             +---------+---------------+---------+-----------+----------+--------------+  FV Prox   Full                                                              +---------+---------------+---------+-----------+----------+--------------+  FV Mid    Full                                                             +---------+---------------+---------+-----------+----------+--------------+  FV Distal Full                                                             +---------+---------------+---------+-----------+----------+--------------+  PFV       Full                                                             +---------+---------------+---------+-----------+----------+--------------+  POP       Full            Yes       Yes                                    +---------+---------------+---------+-----------+----------+--------------+  PTV       Full                                                             +---------+---------------+---------+-----------+----------+--------------+  PERO      Full                                                             +---------+---------------+---------+-----------+----------+--------------+  Summary: RIGHT: - There is no evidence of deep vein thrombosis in the lower extremity.  - No cystic structure found in the popliteal fossa.  LEFT: - There is no evidence of deep vein thrombosis in the lower extremity.  - No cystic structure found in the popliteal fossa.  *See table(s) above for measurements and observations. Electronically signed by Harold Barban MD on 09/17/2021 at 9:42:23 PM.    Final     Cardiac Studies   Left heart catheterization September 19, 2021 CONCLUSIONS: Severe diffuse calcified coronary disease of the native arteries with 100% proximal RCA, 90% ostial circumflex, 99% distal left main, severely and diffusely diseased proximal to mid LAD with threatened septal perforator. Diffusely diseased saphenous vein graft to the right coronary.  Ostial to proximal 70% stenosis. Widely patent sequential graft to first and second obtuse marginal Widely patent saphenous vein graft to the first diagonal Widely patent LIMA  to the mid LAD Aortic valve is heavily calcified, was difficult to cross, but without significant gradient.  LVEDP 10 mmHg. Total contrast 70 cc   RECOMMENDATIONS:   Continue IV fluids for 18 to 24 hours.  He was given an additional 250 cc of saline as a bolus after identifying that LVEDP was 9 mmHg. Monitor kidney function closely.  Contrast exposure was 70 cc. The saphenous vein graft to the right coronary can be stented although it is not so critical that it should be causing enzyme elevation.  Enzyme elevation likely related to left coronary native disease in the septal perforator territory.   TTE 09/18/2021 IMPRESSIONS     1. Left ventricular ejection fraction, by estimation, is 60 to 65%. The  left ventricle has normal function. The left ventricle has no regional  wall motion abnormalities. Left ventricular diastolic parameters are  consistent with Grade I diastolic  dysfunction (impaired relaxation). Elevated left ventricular end-diastolic  pressure.   2. Right ventricular systolic function is moderately reduced. The right  ventricular size is normal.   3. The mitral valve is normal in structure. No evidence of mitral valve  regurgitation. No evidence of mitral stenosis.   4. The AV is poorly visualized to comment on structure. There does appear  to be come calcification of the valve. . The aortic valve was not well  visualized. Aortic valve regurgitation is not visualized. No aortic  stenosis is present.   FINDINGS   Left Ventricle: Left ventricular ejection fraction, by estimation, is 60  to 65%. The left ventricle has normal function. The left ventricle has no  regional wall motion abnormalities. Definity contrast agent was given IV  to delineate the left ventricular   endocardial borders. The left ventricular internal cavity size was normal  in size. There is no left ventricular hypertrophy. Left ventricular  diastolic parameters are consistent with Grade I diastolic  dysfunction  (impaired relaxation). Elevated left  ventricular end-diastolic pressure.   Right Ventricle: The right ventricular size is normal. No increase in  right ventricular wall thickness. Right ventricular systolic function is  moderately reduced.   Left Atrium: Left atrial size was normal in size.   Right Atrium: Right atrial size was normal in size.   Pericardium: There is no evidence of pericardial effusion.   Mitral Valve: The mitral valve is normal in structure. No evidence of  mitral valve regurgitation. No evidence of mitral valve stenosis.   Tricuspid Valve: The tricuspid valve is normal in structure. Tricuspid  valve regurgitation is mild . No evidence of tricuspid stenosis.  Aortic Valve: The AV is poorly visualized to comment on structure. There  does appear to be come calcification of the valve. The aortic valve was  not well visualized. Aortic valve regurgitation is not visualized. No  aortic stenosis is present. Aortic  valve mean gradient measures 3.0 mmHg. Aortic valve peak gradient measures  4.8 mmHg. Aortic valve area, by VTI measures 2.47 cm.   Pulmonic Valve: The pulmonic valve was normal in structure. Pulmonic valve  regurgitation is not visualized. No evidence of pulmonic stenosis.   Aorta: The aortic root is normal in size and structure.   Venous: The inferior vena cava was not well visualized.   IAS/Shunts: No atrial level shunt detected by color flow Doppler.   Patient Profile     76 y.o. male this is a 76 year old male with history of coronary artery disease status post CABG x5 in 1997, type 2 diabetes, chronic kidney disease 3.  Assessment & Plan    Coronary artery disease-status post left heart catheterization today evidence of multivessel native coronary artery disease, saphenous vein to right coronary artery with 70% ostial to proximal stenosis, SVG to OM 1 and 2 patent, SVG to first diagonal patent, LIMA to LAD patent.  Plan at this  time is to maximize medical therapy continue aspirin 81 mg daily, Crestor 40 mg daily, will add Imdur 30 mg daily.  Post cath hydration for the next 24 hours in the setting of his chronic kidney disease.  I discussed the plan with the patient and his wife.  I explained to them that we will optimize his medical therapy with antianginals.  We will follow him closely in the outpatient setting and then determine the need for any revascularization in an near future.  I also explained to them that the risk of contrast with his CKD so future catheterization for PCI will need to be planned strategically.  Echocardiogram today normal EF.  Hypertension diabetes mellitus per primary team.  CKD gentle hydration post heart catheterization.  Follow-up creatinine tomorrow.  For questions or updates, please contact [place] Please consult [email or web address] for contact info under Cardiology/STEMI.      Signed, Berniece Salines, DO  09/19/2021, 1:46 PM

## 2021-09-20 ENCOUNTER — Inpatient Hospital Stay (HOSPITAL_COMMUNITY): Payer: PPO

## 2021-09-20 DIAGNOSIS — I249 Acute ischemic heart disease, unspecified: Secondary | ICD-10-CM | POA: Diagnosis not present

## 2021-09-20 LAB — CBC
HCT: 35.1 % — ABNORMAL LOW (ref 39.0–52.0)
Hemoglobin: 11.4 g/dL — ABNORMAL LOW (ref 13.0–17.0)
MCH: 31.6 pg (ref 26.0–34.0)
MCHC: 32.5 g/dL (ref 30.0–36.0)
MCV: 97.2 fL (ref 80.0–100.0)
Platelets: 123 10*3/uL — ABNORMAL LOW (ref 150–400)
RBC: 3.61 MIL/uL — ABNORMAL LOW (ref 4.22–5.81)
RDW: 13.6 % (ref 11.5–15.5)
WBC: 9.9 10*3/uL (ref 4.0–10.5)
nRBC: 0.3 % — ABNORMAL HIGH (ref 0.0–0.2)

## 2021-09-20 LAB — BASIC METABOLIC PANEL
Anion gap: 6 (ref 5–15)
BUN: 36 mg/dL — ABNORMAL HIGH (ref 8–23)
CO2: 18 mmol/L — ABNORMAL LOW (ref 22–32)
Calcium: 8.1 mg/dL — ABNORMAL LOW (ref 8.9–10.3)
Chloride: 112 mmol/L — ABNORMAL HIGH (ref 98–111)
Creatinine, Ser: 1.86 mg/dL — ABNORMAL HIGH (ref 0.61–1.24)
GFR, Estimated: 37 mL/min — ABNORMAL LOW (ref >60)
Glucose, Bld: 140 mg/dL — ABNORMAL HIGH (ref 70–99)
Potassium: 4.2 mmol/L (ref 3.5–5.1)
Sodium: 136 mmol/L (ref 135–145)

## 2021-09-20 LAB — GLUCOSE, CAPILLARY
Glucose-Capillary: 141 mg/dL — ABNORMAL HIGH (ref 70–99)
Glucose-Capillary: 213 mg/dL — ABNORMAL HIGH (ref 70–99)
Glucose-Capillary: 186 mg/dL — ABNORMAL HIGH (ref 70–99)
Glucose-Capillary: 189 mg/dL — ABNORMAL HIGH (ref 70–99)

## 2021-09-20 LAB — LACTIC ACID, PLASMA
Lactic Acid, Venous: 2.4 mmol/L (ref 0.5–1.9)
Lactic Acid, Venous: 2.4 mmol/L (ref 0.5–1.9)

## 2021-09-20 LAB — PROCALCITONIN: Procalcitonin: 0.12 ng/mL

## 2021-09-20 NOTE — Progress Notes (Signed)
Inpatient Rehab Admissions Coordinator:   Per therapy recommendations, pt was screened for CIR by Shann Medal, PT, DPT.  Pt does demonstrate a functional decline from PLOF, but seems that medical workup is largely complete with recommendations for conservative management.  Would be unlikely to get insurance authorization for CIR.  Would recommend f/u with a lower level of care for therapy.   Shann Medal, PT, DPT Admissions Coordinator [phone] 09/20/21  1:47 PM

## 2021-09-20 NOTE — Progress Notes (Signed)
Progress Note  Patient Name: Kurt Aguilar Date of Encounter: 09/20/2021  [facility] Cardiologist: [name], MD   Subjective   Patient denies any recurrent chest pain since the episode yesterday afternoon. He states he just feels tired. He is still having some shortness of breath and a cough which he has had for weeks. Repeat chest x-ray this morning showed no focal consolidation or pulmonary edema.  Inpatient Medications    Scheduled Meds:  aspirin  81 mg Oral Daily   ezetimibe  10 mg Oral Daily   famotidine  20 mg Oral QHS   heparin  5,000 Units Subcutaneous Q8H   insulin aspart  0-15 Units Subcutaneous TID WC   insulin aspart  0-5 Units Subcutaneous QHS   insulin NPH Human  15 Units Subcutaneous QAC breakfast   metoprolol tartrate  25 mg Oral BID   pantoprazole  80 mg Oral BID AC   predniSONE  10 mg Oral BID WC   rosuvastatin  40 mg Oral Daily   sodium chloride flush  3 mL Intravenous Q12H   sodium chloride flush  3 mL Intravenous Q12H   sodium chloride flush  3 mL Intravenous Q12H   Continuous Infusions:  sodium chloride     PRN Meds: sodium chloride, acetaminophen, acetaminophen, hydrALAZINE, nitroGLYCERIN, ondansetron (ZOFRAN) IV, ondansetron (ZOFRAN) IV, sodium chloride flush   Vital Signs    Vitals:   09/20/21 0652 09/20/21 0742 09/20/21 0743 09/20/21 1111  BP:   133/78 (!) 156/80  Pulse:    66  Resp:    18  Temp:  97.8 F (36.6 C) 97.8 F (36.6 C) 97.8 F (36.6 C)  TempSrc:  Oral Oral Oral  SpO2:    95%  Weight: 103.4 kg     Height:        Intake/Output Summary (Last 24 hours) at 09/20/2021 1221 Last data filed at 09/20/2021 0750 Gross per 24 hour  Intake 1626.25 ml  Output 1350 ml  Net 276.25 ml   Last 3 Weights 09/20/2021 09/19/2021 09/18/2021  Weight (lbs) 227 lb 15.3 oz 223 lb 15.8 oz 225 lb 5 oz  Weight (kg) 103.4 kg 101.6 kg 102.2 kg      Telemetry    Normal sinus rhythm with rates 60s to 70s. - Personally Reviewed  ECG     No new ECG tracing today. - Personally Reviewed  Physical Exam   GEN: No acute distress.   Neck: No JVD. Cardiac: RRR. No murmurs, rubs, or gallops. Right femoral cath site with mild ecchymosis but soft with no evidence of hematoma. Respiratory: Clear to auscultation bilaterally. No wheezes, rhonchi, or rales. GI: Soft, non-distended, and non-tender. MS: Trace lower extremity edema bilaterally. No deformity. Skin: Warm and dry. Neuro:  No focal deficits. Psych: Normal affect. Responds appropriately.  Labs    High Sensitivity Troponin:   Recent Labs  Lab 09/17/21 1247 09/17/21 1731  TROPONINIHS 250* 195*     Chemistry Recent Labs  Lab 09/17/21 2323 09/18/21 0606 09/19/21 0147 09/20/21 0100  NA  --  136 137 136  K  --  4.6 4.4 4.2  CL  --  108 108 112*  CO2  --  18* 16* 18*  GLUCOSE  --  165* 204* 140*  BUN  --  50* 41* 36*  CREATININE  --  1.95* 2.15* 1.86*  CALCIUM  --  8.9 8.6* 8.1*  PROT 5.3*  --   --   --   ALBUMIN 3.0*  --   --   --  AST 25  --   --   --   ALT 36  --   --   --   ALKPHOS 33*  --   --   --   BILITOT 0.8  --   --   --   GFRNONAA  --  35* 31* 37*  ANIONGAP  --  10 13 6     Lipids No results for input(s): CHOL, TRIG, HDL, LABVLDL, LDLCALC, CHOLHDL in the last 168 hours.  Hematology Recent Labs  Lab 09/18/21 0606 09/19/21 0147 09/20/21 0100  WBC 11.4* 11.0* 9.9  RBC 3.98* 3.82* 3.61*  HGB 13.2 12.2* 11.4*  HCT 38.4* 38.1* 35.1*  MCV 96.5 99.7 97.2  MCH 33.2 31.9 31.6  MCHC 34.4 32.0 32.5  RDW 13.6 13.5 13.6  PLT 135* 127* 123*   Thyroid No results for input(s): TSH, FREET4 in the last 168 hours.  BNP Recent Labs  Lab 09/17/21 1731  BNP 37.7    DDimer  Recent Labs  Lab 09/17/21 1731  DDIMER 0.34     Radiology    CARDIAC CATHETERIZATION  Result Date: 09/19/2021 CONCLUSIONS: Severe diffuse calcified coronary disease of the native arteries with 100% proximal RCA, 90% ostial circumflex, 99% distal left main, severely and  diffusely diseased proximal to mid LAD with threatened septal perforator. Diffusely diseased saphenous vein graft to the right coronary.  Ostial to proximal 70% stenosis. Widely patent sequential graft to first and second obtuse marginal Widely patent saphenous vein graft to the first diagonal Widely patent LIMA to the mid LAD Aortic valve is heavily calcified, was difficult to cross, but without significant gradient.  LVEDP 10 mmHg. Total contrast 70 cc RECOMMENDATIONS: Continue IV fluids for 18 to 24 hours.  He was given an additional 250 cc of saline as a bolus after identifying that LVEDP was 9 mmHg. Monitor kidney function closely.  Contrast exposure was 70 cc. The saphenous vein graft to the right coronary can be stented although it is not so critical that it should be causing enzyme elevation.  Enzyme elevation likely related to left coronary native disease in the septal perforator territory.   DG CHEST PORT 1 VIEW  Result Date: 09/20/2021 CLINICAL DATA:  Chest pain, shortness of breath, fatigue EXAM: PORTABLE CHEST 1 VIEW COMPARISON:  Chest radiograph 09/17/2021 FINDINGS: Median sternotomy wires are again noted. The cardiomediastinal silhouette is stable. There is no focal consolidation or pulmonary edema. There is no pleural effusion or pneumothorax. Calcified pleural plaques are again noted in the left apex. There is no acute osseous abnormality. IMPRESSION: Stable chest with no radiographic evidence of acute cardiopulmonary process. Electronically Signed   By: Valetta Mole M.D.   On: 09/20/2021 10:08    Cardiac Studies   Echocardiogram 09/18/2021: Impressions: 1. Left ventricular ejection fraction, by estimation, is 60 to 65%. The  left ventricle has normal function. The left ventricle has no regional  wall motion abnormalities. Left ventricular diastolic parameters are  consistent with Grade I diastolic  dysfunction (impaired relaxation). Elevated left ventricular end-diastolic  pressure.    2. Right ventricular systolic function is moderately reduced. The right  ventricular size is normal.   3. The mitral valve is normal in structure. No evidence of mitral valve  regurgitation. No evidence of mitral stenosis.   4. The AV is poorly visualized to comment on structure. There does appear  to be come calcification of the valve. . The aortic valve was not well  visualized. Aortic valve regurgitation is not  visualized. No aortic  stenosis is present.  _______________  Left Cardiac Catheterization 09/19/2021: Conclusions: Severe diffuse calcified coronary disease of the native arteries with 100% proximal RCA, 90% ostial circumflex, 99% distal left main, severely and diffusely diseased proximal to mid LAD with threatened septal perforator. Diffusely diseased saphenous vein graft to the right coronary.  Ostial to proximal 70% stenosis. Widely patent sequential graft to first and second obtuse marginal Widely patent saphenous vein graft to the first diagonal Widely patent LIMA to the mid LAD Aortic valve is heavily calcified, was difficult to cross, but without significant gradient.  LVEDP 10 mmHg. Total contrast 70 cc   Recommendations: Continue IV fluids for 18 to 24 hours.  He was given an additional 250 cc of saline as a bolus after identifying that LVEDP was 9 mmHg. Monitor kidney function closely.  Contrast exposure was 70 cc. The saphenous vein graft to the right coronary can be stented although it is not so critical that it should be causing enzyme elevation.  Enzyme elevation likely related to left coronary native disease in the septal perforator territory.  Diagnostic Dominance: Right   Patient Profile     76 y.o. male with a history of CAD s/p CABG x5 in 1997, hypertension, hyperlipidemia, type 2 diabetes mellitus, CKD stage III-IV, sleep apnea on CPAP, and GERD who was admitted on 09/17/2021 with chest pain and shortness of breath and found to have elevated troponin  concerning for NSTEMI.  Assessment & Plan    Demand Ischemia CAD s/p CABG History of remote CABG x5 in 1997 who presented with intermittent chest pain over the last couple of weeks and progressive shortness of breath. High-sensitivity troponin 250 >> 195. Echo showed LVEF of 60-65% with normal wall motion and grade 1 diastolic dysfunction as well as moderately reduced RV systolic function. LHC on 1/12 showed severe diffuse calcified disease of native arteries with diffusely diseased SVG to RCA with 70% ostial to proximal stenosis. All other grafts were widely patent. LVEDP 9 mmHg. Stenosis of SVG to RCA can be stented but was not felt to be so critical that is should cause enzyme elevation. Enzyme elevation felt to likely be related to left coronary native disease in the septal perforator territory. Medical therapy was recommended. - Patient denies any recurrent chest pain since yesterday. He does continue to have shortness of breath as well as cough which he has had for weeks. I really don't think his shortness of breath is an anginal equivalent.  - Continue Aspirin 81mg  daily, Lopressor 25mg  twice daily, and Crestor 40mg  daily/Zetia 10mg  daily.  Shortness of Breath Cough Patient reports persistent shortness of breath and cough which he has had for weeks. Do not think this is cardiac in nature. BNP was normal and LVEDP was 9 on cath yesterday which is not suggestive of CHF. Do not think this is an anginal equivalent. May be from [place] infection in 08/2021. Management per primary team.  Hypertension BP mildly elevated here but states it is usually well controlled at home. - Continue Lopressor 25mg  twice daily. - If additional therapy is needed, would consider Amlodipine for antianginal benefit.  Hyperlipidemia LDL 74 in 04/2021. - Continue Crestor 40mg  daily and Zetia 10mg  daily. - Will recheck fasting lipid panel tomorrow morning if still here.  Type 2 Diabetes Mellitus Hemoglobin A1c 9.5 on  09/13/2021. - Management per primary team.  Acute on CKD Stage III-IV Creatinine 2.34 on admission. Baseline around 1.8. - Patient was gentle hydrate before  and after cardiac catheterization. Creatinine back to baseline of 1.86 today. - Continue to monitor closely.  Lactic Acidosis Lactic acid remains elevated. It was as high as 4.1 on 09/17/2021 and is 2.4 today. Unclear etiology. Management per primary team.   For questions or updates, please contact [place] Please consult [email or web address] for contact info under        Signed, Darreld Mclean, PA-C  09/20/2021, 12:21 PM

## 2021-09-20 NOTE — Care Management Important Message (Signed)
Important Message  Patient Details  Name: Kurt Aguilar MRN: [id number] Date of Birth: 11-13-1945   Medicare Important Message Given:  Yes     Shelda Altes 09/20/2021, 1:14 PM

## 2021-09-20 NOTE — Evaluation (Signed)
Occupational Therapy Evaluation Patient Details Name: Kurt Aguilar MRN: [id number] DOB: [birth year] Today's Date: 09/20/2021   History of Present Illness Pt is a 76 y/o male admitted secondary to worsening SOB and chest pain. Pt with recent diagnosis of BOOP and Covid. Pt is s/p heart cath on 1/12. PMH includes HTN, DM, PE, and prostate cancer.   Clinical Impression   PTA patient independent. Admitted for above and limited by problem list below, including impaired balance, decreased activity tolerance, generalized weakness.  Patient currently requires min assist for transfers and ADLs in room, but very limited tolerance and dizziness in standing.  BP stable sitting/standing, SpO2 >90% on RA.  Believe he will benefit from continued OT services acutely and after dc at AIR level to optimize independence and return to PLOF (as he was very independent). Will follow acutely.      Recommendations for follow up therapy are one component of a multi-disciplinary discharge planning process, led by the attending physician.  Recommendations may be updated based on patient status, additional functional criteria and insurance authorization.   Follow Up Recommendations  Acute inpatient rehab (3hours/day)    Assistance Recommended at Discharge Frequent or constant Supervision/Assistance  Patient can return home with the following A little help with walking and/or transfers;A little help with bathing/dressing/bathroom    Functional Status Assessment  Patient has had a recent decline in their functional status and demonstrates the ability to make significant improvements in function in a reasonable and predictable amount of time.  Equipment Recommendations  Other (comment) (TBD)    Recommendations for Other Services Rehab consult     Precautions / Restrictions Precautions Precautions: Fall Restrictions Weight Bearing Restrictions: No      Mobility Bed Mobility Overal bed mobility: Needs  Assistance Bed Mobility: Supine to Sit     Supine to sit: Min assist     General bed mobility comments: OOB in recliner upon entry    Transfers Overall transfer level: Needs assistance Equipment used: Rolling walker (2 wheels) Transfers: Sit to/from Stand Sit to Stand: Min assist           General transfer comment: Min A for lift assist and steadying to stand. Pt requiring increased time and effort to perform.      Balance Overall balance assessment: Needs assistance Sitting-balance support: No upper extremity supported;Feet supported Sitting balance-Leahy Scale: Fair     Standing balance support: Bilateral upper extremity supported Standing balance-Leahy Scale: Poor Standing balance comment: REliant on BUE support                           ADL either performed or assessed with clinical judgement   ADL Overall ADL's : Needs assistance/impaired     Grooming: Set up;Sitting   Upper Body Bathing: Set up;Sitting   Lower Body Bathing: Sit to/from stand;Minimal assistance   Upper Body Dressing : Set up;Sitting   Lower Body Dressing: Minimal assistance;Sit to/from stand   Toilet Transfer: Minimal assistance;Rolling walker (2 wheels)           Functional mobility during ADLs: Minimal assistance;Rolling walker (2 wheels) General ADL Comments: min assist with poor activity tolerance, dizziness with standing but VSS     Vision         Perception     Praxis      Pertinent Vitals/Pain Pain Assessment: No/denies pain     Hand Dominance Right   Extremity/Trunk Assessment Upper Extremity Assessment Upper Extremity Assessment: Generalized  weakness   Lower Extremity Assessment Lower Extremity Assessment: Defer to PT evaluation   Cervical / Trunk Assessment Cervical / Trunk Assessment: Kyphotic   Communication Communication Communication: No difficulties   Cognition Arousal/Alertness: Awake/alert Behavior During Therapy: WFL for tasks  assessed/performed Overall Cognitive Status: Within Functional Limits for tasks assessed                                       General Comments  family present and supportive, concerned for DC home; provided incentive spirometer    Exercises     Shoulder Instructions      Home Living Family/patient expects to be discharged to:: Private residence Living Arrangements: Spouse/significant other Available Help at Discharge: Family Type of Home: House Home Access: Level entry     Home Layout: One level     Bathroom Shower/Tub: Hospital doctor Toilet: Handicapped height     Home Equipment: Conservation officer, nature (2 wheels);Cane - single point          Prior Functioning/Environment Prior Level of Function : Independent/Modified Independent             Mobility Comments: Prior to symptoms starting, pt was very independent. Has been using a cane and has had increased difficulty since symptoms started. ADLs Comments: independent prior to symptoms, has required increased assist since        OT Problem List: Decreased strength;Decreased activity tolerance;Impaired balance (sitting and/or standing);Decreased knowledge of use of DME or AE;Decreased knowledge of precautions;Cardiopulmonary status limiting activity      OT Treatment/Interventions: Self-care/ADL training;Therapeutic exercise;DME and/or AE instruction;Therapeutic activities;Patient/family education;Balance training;Energy conservation    OT Goals(Current goals can be found in the care plan section) Acute Rehab OT Goals Patient Stated Goal: get better and get back to doing what I was before (traveling, etc) OT Goal Formulation: With patient Time For Goal Achievement: 10/04/21 Potential to Achieve Goals: Good  OT Frequency: Min 2X/week    Co-evaluation              AM-PAC OT "6 Clicks" Daily Activity     Outcome Measure Help from another person eating meals?: None Help from another  person taking care of personal grooming?: A Little Help from another person toileting, which includes using toliet, bedpan, or urinal?: A Little Help from another person bathing (including washing, rinsing, drying)?: A Little Help from another person to put on and taking off regular upper body clothing?: A Little Help from another person to put on and taking off regular lower body clothing?: A Little 6 Click Score: 19   End of Session Equipment Utilized During Treatment: Gait belt;Rolling walker (2 wheels) Nurse Communication: Mobility status  Activity Tolerance: Patient limited by fatigue Patient left: in chair;with call bell/phone within reach;with family/visitor present  OT Visit Diagnosis: Other abnormalities of gait and mobility (R26.89);Muscle weakness (generalized) (M62.81)                Time: 1140-1156 OT Time Calculation (min): 16 min Charges:  OT General Charges $OT Visit: 1 Visit OT Evaluation $OT Eval Moderate Complexity: 1 Mod  Jolaine Artist, OT Acute Rehabilitation Services Pager [phone] Office [phone]   Delight Stare 09/20/2021, 1:00 PM

## 2021-09-20 NOTE — TOC Initial Note (Signed)
Transition of Care [facility]) - Initial/Assessment Note    Patient Details  Name: Kurt Aguilar MRN: [id number] Date of Birth: [birth year]  Transition of Care [facility]) CM/SW Contact:    Angelita Ingles, RN Phone Number:[phone]  09/20/2021, 9:41 AM  Clinical Narrative:                 TOC following for patient with high risk for readmission. Patient reports that he is From [facility] where he lives in [place] with wife. Patient reports that he does have PCP Dr. Glori Bickers at [facility]. Patient uses [facility] in [place] for medication needs. Wife reports that she transports patient to all appointments and needed errands.  Patient states that he has walker, cane, glucometer, pulse ox machine, & CPAP at home and has no other DME needs. Currently there are no noted needs at this time. TOC will continue to follow.   Expected Discharge Plan: Home/Self Care Barriers to Discharge: Continued Medical Work up   Patient Goals and CMS Choice Patient states their goals for this hospitalization and ongoing recovery are:: Wants to be able to go back home CMS Medicare.gov Compare Post Acute Care list provided to::  (n/a) Choice offered to / list presented to : NA  Expected Discharge Plan and Services Expected Discharge Plan: Home/Self Care In-house Referral: NA Discharge Planning Services: NA Post Acute Care Choice: NA Living arrangements for the past 2 months: [place] ([place])                 DME Arranged: N/A DME Agency: NA       HH Arranged: NA HH Agency: NA        Prior Living Arrangements/Services Living arrangements for the past 2 months: [place] ([place]) Lives with:: Spouse Patient language and need for interpreter reviewed:: Yes Do you feel safe going back to the place where you live?: Yes      Need for Family Participation in Patient Care: Yes (Comment) Care giver  support system in place?: Yes (comment) Current home services:  (none) Criminal Activity/Legal Involvement Pertinent to Current Situation/Hospitalization: No - Comment as needed  Activities of Daily Living Home Assistive Devices/Equipment: None ADL Screening (condition at time of admission) Patient's cognitive ability adequate to safely complete daily activities?: Yes Is the patient deaf or have difficulty hearing?: No Does the patient have difficulty seeing, even when wearing glasses/contacts?: No Does the patient have difficulty concentrating, remembering, or making decisions?: No Patient able to express need for assistance with ADLs?: Yes Does the patient have difficulty dressing or bathing?: No Independently performs ADLs?: Yes (appropriate for developmental age) Does the patient have difficulty walking or climbing stairs?: No Weakness of Legs: Both Weakness of Arms/Hands: None  Permission Sought/Granted Permission sought to share information with : Family Supports Permission granted to share information with : Yes, Verbal Permission Granted  Share Information with NAME: Kurt Aguilar     Permission granted to share info w Relationship: spouse  Permission granted to share info w Contact Information: [phone]  Emotional Assessment Appearance:: Appears stated age Attitude/Demeanor/Rapport: Gracious Affect (typically observed): Accepting, Pleasant Orientation: : Oriented to Self, Oriented to Place, Oriented to  Time, Oriented to Situation Alcohol / Substance Use: Not Applicable Psych Involvement: No (comment)  Admission diagnosis:  Hyperkalemia [E87.5] Acute coronary syndrome (HCC) [I24.9] Hyperglycemia [R73.9] ACS (acute coronary syndrome) (HCC) [I24.9] Elevated troponin [R77.8] Chest pain [R07.9] Acute renal failure superimposed on  chronic kidney disease, unspecified CKD stage, unspecified acute renal failure type ([place]) [N17.9, N18.9] Patient Active Problem List    Diagnosis Date Noted   AKI (acute kidney injury) ([place]) 09/17/2021   Stage 3b chronic kidney disease (CKD) ([place]) 09/17/2021   Acute coronary syndrome ([place]) 09/17/2021   COVID-19 virus infection 08/26/2021   Osteopenia 05/16/2021   Steroid dependent ([place]) 04/12/2021   Primary osteoarthritis of left knee 05/29/2020   Colon cancer screening 02/13/2017   DM2 (diabetes mellitus, type 2) ([place]) 08/13/2016   Chronic heel pain, left 08/12/2016   Upper airway cough syndrome 07/31/2016   Class 1 obesity with serious comorbidity and body mass index (BMI) of 32.0 to 32.9 in adult 05/19/2016   Routine general medical examination at a health care facility 01/27/2016   Pulmonary infiltrates with high  ESR c/w BOOP/ idiopathic  12/19/2015   Fatigue 12/12/2015   Cough 11/29/2015   Cystic kidney disease 10/01/2015   Renal lesion 09/27/2015   Erectile dysfunction of organic origin 09/27/2015   BPH with obstruction/lower urinary tract symptoms 09/27/2015   Constipation 09/24/2015   Cyst of right kidney 09/24/2015   CAD (coronary artery disease) 06/08/2015   Grieving 11/22/2014   Encounter for Medicare annual wellness exam 07/11/2013   Prostate cancer screening 07/03/2013   Right bundle branch block 02/22/2013   Chronic low back pain 05/13/2011   Obstructive sleep apnea 03/15/2010   Hyperlipidemia associated with type 2 diabetes mellitus ([place]) 04/12/2009   Gout 04/12/2009   Essential hypertension 04/12/2009   Coronary artery disease of bypass graft of native heart with stable angina pectoris ([place]) 04/12/2009   GERD 04/12/2009   Renal insufficiency 04/12/2009   DIVERTICULITIS, HX OF 04/12/2009   PCP:  [name], MD Pharmacy[facility]   [id number]Phone: [phone]: [phone] [place] [address] [place] [address]Phone: [phone] Fax: [phone]    Social Determinants of Health (SDOH) Interventions    Readmission Risk Interventions Readmission Risk Prevention Plan 09/20/2021  Transportation Screening Complete  Medication Review (Press photographer Referral to Pharmacy  PCP or Specialist appointment within 3-5 days of discharge Not Complete  PCP/Specialist Appt Not Complete comments pending patient is not medically stable but does have a PCP  [place]  SW Recovery Care/Counseling Consult Complete  Palliative Care Screening Not APopeNot Applicable  Some recent data might be hidden

## 2021-09-20 NOTE — Progress Notes (Signed)
PROGRESS NOTE    Kurt Aguilar  QQV:[id number] DOB: Nov 07, 1945 DOA: 09/17/2021 PCP: [name], MD   Brief Narrative:  Kurt Aguilar is a 76 y.o. male with medical history significant of CAD s/p CABG in 1997; DM2, HTN, HLD, BOOP, chronic steroid dependence presented to ED with mild intermittent chest pain for 1 month which was worse with exertion and associated with some shortness of breath.  Upon arrival to ED, he was hemodynamically stable however his troponins were elevated.  Admitted under hospitalist service and cardiology consulted.  Assessment & Plan:   Principal Problem:   Acute coronary syndrome (HCC) Active Problems:   Essential hypertension   Right bundle branch block   CAD (coronary artery disease)   DM2 (diabetes mellitus, type 2) (HCC)   Steroid dependent (HCC)   AKI (acute kidney injury) ([place])   Stage 3b chronic kidney disease (CKD) (HCC)  Chest pain/elevated troponin:   Troponin elevated but flat 250 >> 195.  Echo with no wall motion abnormality, normal ejection fraction.  Underwent left heart catheterization today evidence of multivessel native coronary artery disease, saphenous vein to right coronary artery with 70% ostial to proximal stenosis, SVG to OM 1 and 2 patent, SVG to first diagonal patent, LIMA to LAD patent.  Per cardiology plan at this time is to maximize medical therapy continue aspirin 81 mg daily, Crestor 40 mg daily, Zetia and Lopressor.  They will follow him closely in the outpatient and further determine the need for any revascularization in near future.  His CKD can be the limiting factor.  Hypertension: Controlled.  Continue current regimen.  Defer further management to cardiology.  Sinus tachycardia: Resumed home dose of metoprolol.  Now resolved.   Lactic acidosis with metabolic acidosis: Source unknown and this is a stable.  Probably chronic.  AKI on CKD stage IIIb: Baseline creatinine around 1.9.  Presented with 2.34 which is  improving and down to 1.86 today.  Hyperkalemia: Resolved.  Type 2 diabetes mellitus: Elevated yesterday but was normal this morning, his NPH was held yesterday for some reason.  It was not given despite of me advising the nurse to do it later in the day.  He is hyperglycemic but he has received his NPH 15 units now and I am hoping that his blood sugar will improve.  Continue SSI.  HLD: Continue Crestor.  Generalized weakness: Complains of significant weakness, seen by PT OT who recommended CIR.  Dyspnea/cough: Patient complains of dyspnea even with minimal exertion.  He did have some rhonchi on my examination but chest x-ray negative for infiltrates.  No leukocytosis and no fever.  Checking procalcitonin but I doubt this is pneumonia.  Per cardiology note, this is not CHF either since his BNP was normal.  Fortunately, he is not hypoxic.  He also had VQ scan which was negative for PE.  Reportedly he also suffered from [place] infection in December last year.  His symptoms could be residual from COVID infection as well.  DVT prophylaxis: heparin injection 5,000 Units Start: 09/19/21 2200 SCD's Start: 09/19/21 1110   Code Status: Full Code  Family Communication:  Wife and daughter present at bedside.  Plan of care discussed with patient in length and he/she verbalized understanding and agreed with it.  Status is: Inpatient  Remains inpatient appropriate because: Needs cardiac cath.  Estimated body mass index is 32.71 kg/m as calculated from the following:   Height as of this encounter: 5\' 10"  (1.778 m).   Weight  as of this encounter: 103.4 kg.    Nutritional Assessment: Body mass index is 32.71 kg/m.Marland Kitchen Seen by dietician.  I agree with the assessment and plan as outlined below: Nutrition Status:        . Skin Assessment: I have examined the patient's skin and I agree with the wound assessment as performed by the wound care RN as outlined below:    Consultants:   Cardiology  Procedures:  None  Antimicrobials:  Anti-infectives (From admission, onward)    None         Subjective: Seen and examined.  Wife and daughter at the bedside.  He complains of weakness and shortness of breath.  Appears comfortable and not hypoxic.  Objective: Vitals:   09/20/21 0652 09/20/21 0742 09/20/21 0743 09/20/21 1111  BP:   133/78 (!) 156/80  Pulse:    66  Resp:    18  Temp:  97.8 F (36.6 C) 97.8 F (36.6 C) 97.8 F (36.6 C)  TempSrc:  Oral Oral Oral  SpO2:    95%  Weight: 103.4 kg     Height:        Intake/Output Summary (Last 24 hours) at 09/20/2021 1313 Last data filed at 09/20/2021 0750 Gross per 24 hour  Intake 1386.25 ml  Output 1350 ml  Net 36.25 ml    Filed Weights   09/18/21 0304 09/19/21 0438 09/20/21 0652  Weight: 102.2 kg 101.6 kg 103.4 kg    Examination:  General exam: Appears calm and comfortable  Respiratory system: Rhonchi bibasilar. Respiratory effort normal. Cardiovascular system: S1 & S2 heard, RRR. No JVD, murmurs, rubs, gallops or clicks.  +1 pitting edema bilateral lower extremity Gastrointestinal system: Abdomen is nondistended, soft and nontender. No organomegaly or masses felt. Normal bowel sounds heard. Central nervous system: Alert and oriented. No focal neurological deficits. Extremities: Symmetric 5 x 5 power. Skin: No rashes, lesions or ulcers.  Psychiatry: Judgement and insight appear normal. Mood & affect appropriate.   Data Reviewed: I have personally reviewed following labs and imaging studies  CBC: Recent Labs  Lab 09/17/21 1247 09/17/21 1856 09/18/21 0606 09/19/21 0147 09/20/21 0100  WBC 13.7*  --  11.4* 11.0* 9.9  HGB 14.3 11.9* 13.2 12.2* 11.4*  HCT 44.0 35.0* 38.4* 38.1* 35.1*  MCV 100.5*  --  96.5 99.7 97.2  PLT 172  --  135* 127* 123*    Basic Metabolic Panel: Recent Labs  Lab 09/17/21 1247 09/17/21 1856 09/18/21 0606 09/19/21 0147 09/20/21 0100  NA 137 138 136 137 136  K  5.4* 4.6 4.6 4.4 4.2  CL 104  --  108 108 112*  CO2 15*  --  18* 16* 18*  GLUCOSE 268*  --  165* 204* 140*  BUN 60*  --  50* 41* 36*  CREATININE 2.34*  --  1.95* 2.15* 1.86*  CALCIUM 9.6  --  8.9 8.6* 8.1*    GFR: Estimated Creatinine Clearance: 40.1 mL/min (A) (by C-G formula based on SCr of 1.86 mg/dL (H)). Liver Function Tests: Recent Labs  Lab 09/17/21 2323  AST 25  ALT 36  ALKPHOS 33*  BILITOT 0.8  PROT 5.3*  ALBUMIN 3.0*    No results for input(s): LIPASE, AMYLASE in the last 168 hours. No results for input(s): AMMONIA in the last 168 hours. Coagulation Profile: No results for input(s): INR, PROTIME in the last 168 hours. Cardiac Enzymes: No results for input(s): CKTOTAL, CKMB, CKMBINDEX, TROPONINI in the last 168 hours. BNP (last 3 results)  No results for input(s): PROBNP in the last 8760 hours. HbA1C: No results for input(s): HGBA1C in the last 72 hours. CBG: Recent Labs  Lab 09/19/21 1033 09/19/21 1623 09/19/21 2118 09/20/21 0623 09/20/21 1109  GLUCAP 99 357* 265* 141* 213*    Lipid Profile: No results for input(s): CHOL, HDL, LDLCALC, TRIG, CHOLHDL, LDLDIRECT in the last 72 hours. Thyroid Function Tests: No results for input(s): TSH, T4TOTAL, FREET4, T3FREE, THYROIDAB in the last 72 hours. Anemia Panel: No results for input(s): VITAMINB12, FOLATE, FERRITIN, TIBC, IRON, RETICCTPCT in the last 72 hours. Sepsis Labs: Recent Labs  Lab 09/18/21 0606 09/18/21 0903 09/18/21 1456 09/19/21 1433 09/19/21 1632 09/20/21 1103  PROCALCITON <0.10  --   --   --   --   --   LATICACIDVEN 2.7*   < > 2.1* 2.2* 3.5* 2.4*   < > = values in this interval not displayed.     Recent Results (from the past 240 hour(s))  Resp Panel by RT-PCR (Flu A&B, Covid) Nasopharyngeal Swab     Status: None   Collection Time: 09/17/21  5:09 PM   Specimen: Nasopharyngeal Swab; Nasopharyngeal(NP) swabs in vial transport medium  Result Value Ref Range Status   SARS Coronavirus 2  by RT PCR NEGATIVE NEGATIVE Final    Comment: (NOTE) SARS-CoV-2 target nucleic acids are NOT DETECTED.  The SARS-CoV-2 RNA is generally detectable in upper respiratory specimens during the acute phase of infection. The lowest concentration of SARS-CoV-2 viral copies this assay can detect is 138 copies/mL. A negative result does not preclude SARS-Cov-2 infection and should not be used as the sole basis for treatment or other patient management decisions. A negative result may occur with  improper specimen collection/handling, submission of specimen other than nasopharyngeal swab, presence of viral mutation(s) within the areas targeted by this assay, and inadequate number of viral copies(<138 copies/mL). A negative result must be combined with clinical observations, patient history, and epidemiological information. The expected result is Negative.  Fact Sheet for Patients:  EntrepreneurPulse.com.au  Fact Sheet for Healthcare Providers:  IncredibleEmployment.be  This test is no t yet approved or cleared by the [place] FDA and  has been authorized for detection and/or diagnosis of SARS-CoV-2 by FDA under an Emergency Use Authorization (EUA). This EUA will remain  in effect (meaning this test can be used) for the duration of the COVID-19 declaration under Section 564(b)(1) of the Act, 21 U.S.C.section 360bbb-3(b)(1), unless the authorization is terminated  or revoked sooner.       Influenza A by PCR NEGATIVE NEGATIVE Final   Influenza B by PCR NEGATIVE NEGATIVE Final    Comment: (NOTE) The Xpert Xpress SARS-CoV-2/FLU/RSV plus assay is intended as an aid in the diagnosis of influenza from Nasopharyngeal swab specimens and should not be used as a sole basis for treatment. Nasal washings and aspirates are unacceptable for Xpert Xpress SARS-CoV-2/FLU/RSV testing.  Fact Sheet for Patients: EntrepreneurPulse.com.au  Fact  Sheet for Healthcare Providers: IncredibleEmployment.be  This test is not yet approved or cleared by the [place] FDA and has been authorized for detection and/or diagnosis of SARS-CoV-2 by FDA under an Emergency Use Authorization (EUA). This EUA will remain in effect (meaning this test can be used) for the duration of the COVID-19 declaration under Section 564(b)(1) of the Act, 21 U.S.C. section 360bbb-3(b)(1), unless the authorization is terminated or revoked.  Performed at [facility], [place] [address], [place], [place] [address]   MRSA Next Gen by PCR, Nasal  Status: None   Collection Time: 09/18/21  3:35 AM   Specimen: Nasal Mucosa; Nasal Swab  Result Value Ref Range Status   MRSA by PCR Next Gen NOT DETECTED NOT DETECTED Final    Comment: (NOTE) The GeneXpert MRSA Assay (FDA approved for NASAL specimens only), is one component of a comprehensive MRSA colonization surveillance program. It is not intended to diagnose MRSA infection nor to guide or monitor treatment for MRSA infections. Test performance is not FDA approved in patients less than 11 years old. Performed at [facility], [place] [address], [place], [place] [address]   Culture, blood (routine x 2)     Status: None (Preliminary result)   Collection Time: 09/18/21  6:06 AM   Specimen: BLOOD RIGHT FOREARM  Result Value Ref Range Status   Specimen Description BLOOD RIGHT FOREARM  Final   Special Requests   Final    BOTTLES DRAWN AEROBIC AND ANAEROBIC Blood Culture adequate volume   Culture   Final    NO GROWTH 2 DAYS Performed at [facility], [place] [address], [place], [place] [address]    Report Status PENDING  Incomplete  Culture, blood (routine x 2)     Status: None (Preliminary result)   Collection Time: 09/18/21  6:11 AM   Specimen: BLOOD RIGHT HAND  Result Value Ref Range Status   Specimen Description BLOOD RIGHT HAND  Final   Special Requests   Final    BOTTLES  DRAWN AEROBIC AND ANAEROBIC Blood Culture adequate volume   Culture   Final    NO GROWTH 2 DAYS Performed at [facility], [place] [address] [place], [place] [address]    Report Status PENDING  Incomplete      Radiology Studies: CARDIAC CATHETERIZATION  Result Date: 09/19/2021 CONCLUSIONS: Severe diffuse calcified coronary disease of the native arteries with 100% proximal RCA, 90% ostial circumflex, 99% distal left main, severely and diffusely diseased proximal to mid LAD with threatened septal perforator. Diffusely diseased saphenous vein graft to the right coronary.  Ostial to proximal 70% stenosis. Widely patent sequential graft to first and second obtuse marginal Widely patent saphenous vein graft to the first diagonal Widely patent LIMA to the mid LAD Aortic valve is heavily calcified, was difficult to cross, but without significant gradient.  LVEDP 10 mmHg. Total contrast 70 cc RECOMMENDATIONS: Continue IV fluids for 18 to 24 hours.  He was given an additional 250 cc of saline as a bolus after identifying that LVEDP was 9 mmHg. Monitor kidney function closely.  Contrast exposure was 70 cc. The saphenous vein graft to the right coronary can be stented although it is not so critical that it should be causing enzyme elevation.  Enzyme elevation likely related to left coronary native disease in the septal perforator territory.   DG CHEST PORT 1 VIEW  Result Date: 09/20/2021 CLINICAL DATA:  Chest pain, shortness of breath, fatigue EXAM: PORTABLE CHEST 1 VIEW COMPARISON:  Chest radiograph 09/17/2021 FINDINGS: Median sternotomy wires are again noted. The cardiomediastinal silhouette is stable. There is no focal consolidation or pulmonary edema. There is no pleural effusion or pneumothorax. Calcified pleural plaques are again noted in the left apex. There is no acute osseous abnormality. IMPRESSION: Stable chest with no radiographic evidence of acute cardiopulmonary process. Electronically Signed    By: Valetta Mole M.D.   On: 09/20/2021 10:08    Scheduled Meds:  aspirin  81 mg Oral Daily   ezetimibe  10 mg  Oral Daily   famotidine  20 mg Oral QHS   heparin  5,000 Units Subcutaneous Q8H   insulin aspart  0-15 Units Subcutaneous TID WC   insulin aspart  0-5 Units Subcutaneous QHS   insulin NPH Human  15 Units Subcutaneous QAC breakfast   metoprolol tartrate  25 mg Oral BID   pantoprazole  80 mg Oral BID AC   predniSONE  10 mg Oral BID WC   rosuvastatin  40 mg Oral Daily   sodium chloride flush  3 mL Intravenous Q12H   sodium chloride flush  3 mL Intravenous Q12H   sodium chloride flush  3 mL Intravenous Q12H   Continuous Infusions:  sodium chloride       LOS: 3 days   Time spent: 29-minute  Darliss Cheney, MD [facility]  09/20/2021, 1:13 PM  Please page via [facility] and do not message via secure chat for anything urgent. Secure chat can be used for anything non urgent.  How to contact the [facility] Attending or Consulting provider [place] or covering provider during after hours [place], for this patient?  Check the care team in [facility] and look for a) attending/consulting TRH provider listed and b) the [facility] team listed. Page or secure chat [phone]. Log into [email or web address] and use [place] universal password to access. If you do not have the password, please contact the hospital operator. Locate the [facility] provider you are looking for under [facility] and page to a number that you can be directly reached. If you still have difficulty reaching the provider, please page the [facility] (Director on Call) for the Hospitalists listed on amion for assistance.

## 2021-09-20 NOTE — Evaluation (Signed)
Physical Therapy Evaluation Patient Details Name: PANKAJ HAACK MRN: [id number] DOB: [birth year] Today's Date: 09/20/2021  History of Present Illness  Pt is a 76 y/o male admitted secondary to worsening SOB and chest pain. Pt with recent diagnosis of BOOP and Covid. Pt is s/p heart cath on 1/12. PMH includes HTN, DM, PE, and prostate cancer.  Clinical Impression  Pt admitted secondary to problem above with deficits below. Pt with increased SOB throughout, weakness, and unsteadiness. Pt becoming very shaky and unsteady after very short distance ambulation, requiring up to mod A for steadying. Pt previously very independent prior to symptom onset. Recommending acute inpatient rehab level therapies at d/c to address current deficits. Will continue to follow acutely.        Recommendations for follow up therapy are one component of a multi-disciplinary discharge planning process, led by the attending physician.  Recommendations may be updated based on patient status, additional functional criteria and insurance authorization.  Follow Up Recommendations Acute inpatient rehab (3hours/day)    Assistance Recommended at Discharge Intermittent Supervision/Assistance  Patient can return home with the following  A lot of help with walking and/or transfers;A lot of help with bathing/dressing/bathroom    Equipment Recommendations Wheelchair (measurements PT);Wheelchair cushion (measurements PT)  Recommendations for Other Services       Functional Status Assessment Patient has had a recent decline in their functional status and demonstrates the ability to make significant improvements in function in a reasonable and predictable amount of time.     Precautions / Restrictions Precautions Precautions: Fall Restrictions Weight Bearing Restrictions: No      Mobility  Bed Mobility Overal bed mobility: Needs Assistance Bed Mobility: Supine to Sit     Supine to sit: Min assist     General bed  mobility comments: Min A for trunk assist to come to sitting. Pt requiring extended time at EOB for SOB to improve. oxygen sats WFL on RA.    Transfers Overall transfer level: Needs assistance Equipment used: Rolling walker (2 wheels) Transfers: Sit to/from Stand Sit to Stand: Min assist           General transfer comment: Min A for lift assist and steadying to stand. Pt requiring increased time and effort to perform.    Ambulation/Gait Ambulation/Gait assistance: Min assist;Mod assist Gait Distance (Feet): 10 Feet Assistive device: Rolling walker (2 wheels) Gait Pattern/deviations: Step-through pattern;Decreased stride length Gait velocity: Decreased     General Gait Details: Pt becoming very shaky after short distance ambulation and SOB, and required seated rest in chair. Mod A for steadying when pt became shaky.  Stairs            Wheelchair Mobility    Modified Rankin (Stroke Patients Only)       Balance Overall balance assessment: Needs assistance Sitting-balance support: No upper extremity supported;Feet supported Sitting balance-Leahy Scale: Fair     Standing balance support: Bilateral upper extremity supported Standing balance-Leahy Scale: Poor Standing balance comment: REliant on BUE support                             Pertinent Vitals/Pain Pain Assessment: No/denies pain    Home Living Family/patient expects to be discharged to:: Private residence Living Arrangements: Spouse/significant other Available Help at Discharge: Family Type of Home: House Home Access: Level entry       Home Layout: One level Home Equipment: Conservation officer, nature (2 wheels);Cane - single point  Prior Function Prior Level of Function : Independent/Modified Independent             Mobility Comments: Prior to symptoms starting, pt was very independent. Has been using a cane and has had increased difficulty since symptoms started.       Hand  Dominance        Extremity/Trunk Assessment   Upper Extremity Assessment Upper Extremity Assessment: Defer to OT evaluation    Lower Extremity Assessment Lower Extremity Assessment: Generalized weakness    Cervical / Trunk Assessment Cervical / Trunk Assessment: Kyphotic  Communication   Communication: No difficulties  Cognition Arousal/Alertness: Awake/alert Behavior During Therapy: WFL for tasks assessed/performed Overall Cognitive Status: Within Functional Limits for tasks assessed                                          General Comments General comments (skin integrity, edema, etc.): Pt and family very concerned about returning home    Exercises     Assessment/Plan    PT Assessment Patient needs continued PT services  PT Problem List Decreased strength;Decreased activity tolerance;Decreased mobility;Decreased balance;Cardiopulmonary status limiting activity       PT Treatment Interventions DME instruction;Gait training;Functional mobility training;Stair training;Therapeutic activities;Therapeutic exercise;Balance training;Patient/family education    PT Goals (Current goals can be found in the Care Plan section)  Acute Rehab PT Goals Patient Stated Goal: to be independent PT Goal Formulation: With patient/family Time For Goal Achievement: 10/04/21 Potential to Achieve Goals: Good    Frequency Min 3X/week     Co-evaluation               AM-PAC PT "6 Clicks" Mobility  Outcome Measure Help needed turning from your back to your side while in a flat bed without using bedrails?: A Little Help needed moving from lying on your back to sitting on the side of a flat bed without using bedrails?: A Little Help needed moving to and from a bed to a chair (including a wheelchair)?: A Little Help needed standing up from a chair using your arms (e.g., wheelchair or bedside chair)?: A Little Help needed to walk in hospital room?: Total Help needed  climbing 3-5 steps with a railing? : Total 6 Click Score: 14    End of Session Equipment Utilized During Treatment: Gait belt Activity Tolerance: Patient tolerated treatment well Patient left: in chair;with call bell/phone within reach;with family/visitor present Nurse Communication: Mobility status PT Visit Diagnosis: Unsteadiness on feet (R26.81);Muscle weakness (generalized) (M62.81);Difficulty in walking, not elsewhere classified (R26.2)    Time: 1123-1140 PT Time Calculation (min) (ACUTE ONLY): 17 min   Charges:   PT Evaluation $PT Eval Moderate Complexity: 1 Mod          Reuel Derby, PT, DPT  Acute Rehabilitation Services  Pager: [phone] Office: [phone]   Rudean Hitt 09/20/2021, 12:35 PM

## 2021-09-21 DIAGNOSIS — N179 Acute kidney failure, unspecified: Secondary | ICD-10-CM

## 2021-09-21 DIAGNOSIS — N189 Chronic kidney disease, unspecified: Secondary | ICD-10-CM

## 2021-09-21 DIAGNOSIS — I249 Acute ischemic heart disease, unspecified: Secondary | ICD-10-CM | POA: Diagnosis not present

## 2021-09-21 LAB — BASIC METABOLIC PANEL
Anion gap: 12 (ref 5–15)
BUN: 34 mg/dL — ABNORMAL HIGH (ref 8–23)
CO2: 18 mmol/L — ABNORMAL LOW (ref 22–32)
Calcium: 8.8 mg/dL — ABNORMAL LOW (ref 8.9–10.3)
Chloride: 106 mmol/L (ref 98–111)
Creatinine, Ser: 2.01 mg/dL — ABNORMAL HIGH (ref 0.61–1.24)
GFR, Estimated: 34 mL/min — ABNORMAL LOW (ref >60)
Glucose, Bld: 198 mg/dL — ABNORMAL HIGH (ref 70–99)
Potassium: 5.9 mmol/L — ABNORMAL HIGH (ref 3.5–5.1)
Sodium: 136 mmol/L (ref 135–145)

## 2021-09-21 LAB — GLUCOSE, CAPILLARY
Glucose-Capillary: 166 mg/dL — ABNORMAL HIGH (ref 70–99)
Glucose-Capillary: 244 mg/dL — ABNORMAL HIGH (ref 70–99)
Glucose-Capillary: 252 mg/dL — ABNORMAL HIGH (ref 70–99)
Glucose-Capillary: 183 mg/dL — ABNORMAL HIGH (ref 70–99)

## 2021-09-21 MED ORDER — SODIUM ZIRCONIUM CYCLOSILICATE 10 G PO PACK
10.0000 g | PACK | Freq: Once | ORAL | Status: AC
  Administered 2021-09-21: 10 g via ORAL
  Filled 2021-09-21: qty 1

## 2021-09-21 MED ORDER — GUAIFENESIN-CODEINE 100-10 MG/5ML PO SOLN
5.0000 mL | ORAL | Status: DC | PRN
  Administered 2021-09-21 – 2021-09-22 (×3): 5 mL via ORAL
  Filled 2021-09-21 (×3): qty 5

## 2021-09-21 MED ORDER — BENZONATATE 100 MG PO CAPS
200.0000 mg | ORAL_CAPSULE | Freq: Three times a day (TID) | ORAL | Status: DC
  Administered 2021-09-21 – 2021-09-25 (×13): 200 mg via ORAL
  Filled 2021-09-21 (×13): qty 2

## 2021-09-21 MED ORDER — LACTATED RINGERS IV SOLN: INTRAVENOUS | Status: AC

## 2021-09-21 MED ORDER — METOPROLOL TARTRATE 50 MG PO TABS
50.0000 mg | ORAL_TABLET | Freq: Two times a day (BID) | ORAL | Status: DC
  Administered 2021-09-21 – 2021-09-25 (×9): 50 mg via ORAL
  Filled 2021-09-21 (×9): qty 1

## 2021-09-21 MED ORDER — PREDNISONE 10 MG PO TABS
10.0000 mg | ORAL_TABLET | Freq: Every day | ORAL | Status: DC
  Administered 2021-09-21 – 2021-09-22 (×2): 10 mg via ORAL
  Filled 2021-09-21 (×2): qty 1

## 2021-09-21 NOTE — TOC Progression Note (Signed)
Transition of Care [facility]) - Progression Note    Patient Details  Name: Kurt Aguilar MRN: [id number] Date of Birth: 12/12/1945  Transition of Care [facility]) CM/SW [place], [place] Phone Number: [phone] 09/21/2021, 12:17 PM  Clinical Narrative:     PT recommendation for pt is CIR however CIR assessed and stated that it appeared that pt insurance would not accept [employer] insurance therefore a lower level rehab should be pursued.  CSW met with pt an pt's spouse at bedside to discuss an option of a lower level rehab. Pt explained that he was from [facility] and he wanted to receive his [facility] Re[facility] at [facility]. CSW provided the medicare.gov rating list and received permission to fax referral information to [facility]. Pt explained that he has been vaccinated and boostered. Pt explained that he only wants to go to [facility].  TOC team will continue to assist with discharge planning needs.    Expected Discharge Plan: Skilled Nursing Facility Barriers to Discharge: Ship broker, SNF Pending bed offer, Continued Medical Work up  Expected Discharge Plan and Services Expected Discharge Plan: [place] In-house Referral: NA Discharge Planning Services: NA Post Acute Care Choice: NA Living arrangements for the past 2 months: [place] ([place])                 DME Arranged: N/A DME Agency: NA       HH Arranged: NA HH Agency: NA         Social Determinants of Health (SDOH) Interventions    Readmission Risk Interventions Readmission Risk Prevention Plan 09/20/2021  Transportation Screening Complete  Medication Review Press photographer) Referral to Pharmacy  PCP or Specialist appointment within 3-5 days of discharge Not Complete  PCP/Specialist Appt Not Complete comments pending patient is not medically stable but does have a PCP  [place] or [place] Complete  SW Recovery  Care/Counseling Consult Complete  [place] Not Applicable  Some recent data might be hidden

## 2021-09-21 NOTE — NC FL2 (Signed)
[place] LEVEL OF CARE SCREENING TOOL     IDENTIFICATION  Patient Name: Kurt Aguilar Birthdate: 1946-08-29 Sex: male Admission Date (Current Location): 09/17/2021  [facility] and [place] Number:  Herbalist and Address:  [facility] [place]. [facility], [place] [address], [place], [place] [address]      Provider Number: [id number]  Attending Physician Name and Address:  Mendel Corning, MD  Relative Name and Phone Number:  Izora Gala [phone]    Current Level of Care: Hospital Recommended Level of Care: [place] Prior Approval Number:    Date Approved/Denied:   PASRR Number: [id number]  Discharge Plan: SNF    Current Diagnoses: Patient Active Problem List   Diagnosis Date Noted   AKI (acute kidney injury) ([place]) 09/17/2021   Stage 3b chronic kidney disease (CKD) ([place]) 09/17/2021   Acute coronary syndrome ([place]) 09/17/2021   COVID-19 virus infection 08/26/2021   Osteopenia 05/16/2021   Steroid dependent ([place]) 04/12/2021   Primary osteoarthritis of left knee 05/29/2020   Colon cancer screening 02/13/2017   DM2 (diabetes mellitus, type 2) ([place]) 08/13/2016   Chronic heel pain, left 08/12/2016   Upper airway cough syndrome 07/31/2016   Class 1 obesity with serious comorbidity and body mass index (BMI) of 32.0 to 32.9 in adult 05/19/2016   Routine general medical examination at a health care facility 01/27/2016   Pulmonary infiltrates with high  ESR c/w BOOP/ idiopathic  12/19/2015   Fatigue 12/12/2015   Cough 11/29/2015   Cystic kidney disease 10/01/2015   Renal lesion 09/27/2015   Erectile dysfunction of organic origin 09/27/2015   BPH with obstruction/lower urinary tract symptoms 09/27/2015   Constipation 09/24/2015   Cyst of right kidney 09/24/2015   CAD (coronary artery disease) 06/08/2015   Grieving 11/22/2014   Encounter for Medicare annual wellness exam 07/11/2013   Prostate cancer screening 07/03/2013   Right bundle  branch block 02/22/2013   Chronic low back pain 05/13/2011   Obstructive sleep apnea 03/15/2010   Hyperlipidemia associated with type 2 diabetes mellitus ([place]) 04/12/2009   Gout 04/12/2009   Essential hypertension 04/12/2009   Coronary artery disease of bypass graft of native heart with stable angina pectoris ([place]) 04/12/2009   GERD 04/12/2009   Renal insufficiency 04/12/2009   DIVERTICULITIS, HX OF 04/12/2009    Orientation RESPIRATION BLADDER Height & Weight     Self, Time, Situation, Place  O2 Continent Weight: 224 lb 10.4 oz (101.9 kg) Height:  $Remove'5\' 10"'KmdphLF$  (177.8 cm)  BEHAVIORAL SYMPTOMS/MOOD NEUROLOGICAL BOWEL NUTRITION STATUS      Continent Diet (See DC summary)  AMBULATORY STATUS COMMUNICATION OF NEEDS Skin   Limited Assist Verbally Other (Comment) (Ecchymosis, arm and leg)                       Personal Care Assistance Level of Assistance  Bathing, Feeding, Dressing Bathing Assistance: Limited assistance Feeding assistance: Independent Dressing Assistance: Limited assistance     Functional Limitations Info  Sight, Hearing, Speech Sight Info: Adequate Hearing Info: Adequate Speech Info: Adequate    SPECIAL CARE FACTORS FREQUENCY  PT (By licensed PT), OT (By licensed OT)     PT Frequency: 5x per week OT Frequency: 5x per week            Contractures Contractures Info: Not present    Additional Factors Info  Code Status, Allergies, Insulin Sliding Scale Code Status Info: Full Allergies Info: Oxycodone-acetaminophen   Insulin  Sliding Scale Info: See DC summary       Current Medications (09/21/2021):  This is the current hospital active medication list Current Facility-Administered Medications  Medication Dose Route Frequency Provider Last Rate Last Admin   0.9 %  sodium chloride infusion  250 mL Intravenous PRN Belva Crome, MD       acetaminophen (TYLENOL) tablet 650 mg  650 mg Oral Q4H PRN Belva Crome, MD       acetaminophen (TYLENOL) tablet  650 mg  650 mg Oral Q4H PRN Belva Crome, MD       aspirin chewable tablet 81 mg  81 mg Oral Daily Belva Crome, MD   81 mg at 09/21/21 4166   benzonatate (TESSALON) capsule 200 mg  200 mg Oral TID Rai, Ripudeep K, MD   200 mg at 09/21/21 0840   ezetimibe (ZETIA) tablet 10 mg  10 mg Oral Daily Belva Crome, MD   10 mg at 09/21/21 0843   famotidine (PEPCID) tablet 20 mg  20 mg Oral QHS Belva Crome, MD   20 mg at 09/20/21 2144   guaiFENesin-codeine 100-10 MG/5ML solution 5 mL  5 mL Oral Q4H PRN Rai, Ripudeep K, MD       heparin injection 5,000 Units  5,000 Units Subcutaneous Q8H Belva Crome, MD   5,000 Units at 09/21/21 1307   hydrALAZINE (APRESOLINE) injection 10 mg  10 mg Intravenous Q6H PRN Belva Crome, MD       insulin aspart (novoLOG) injection 0-15 Units  0-15 Units Subcutaneous TID WC Belva Crome, MD   5 Units at 09/21/21 1136   insulin aspart (novoLOG) injection 0-5 Units  0-5 Units Subcutaneous QHS Belva Crome, MD   3 Units at 09/19/21 2134   insulin NPH Human (NOVOLIN N) injection 15 Units  15 Units Subcutaneous QAC breakfast Belva Crome, MD   15 Units at 09/21/21 0840   lactated ringers infusion   Intravenous Continuous Rai, Ripudeep K, MD 75 mL/hr at 09/21/21 0855 New Bag at 09/21/21 0855   metoprolol tartrate (LOPRESSOR) tablet 50 mg  50 mg Oral BID Minus Breeding, MD   50 mg at 09/21/21 0849   nitroGLYCERIN (NITROSTAT) SL tablet 0.4 mg  0.4 mg Sublingual Q5 Min x 3 PRN Belva Crome, MD   0.4 mg at 09/19/21 1416   ondansetron (ZOFRAN) injection 4 mg  4 mg Intravenous Q6H PRN Belva Crome, MD       ondansetron [facility]) injection 4 mg  4 mg Intravenous Q6H PRN Belva Crome, MD       pantoprazole (PROTONIX) EC tablet 80 mg  80 mg Oral BID AC Belva Crome, MD   80 mg at 09/21/21 0840   predniSONE (DELTASONE) tablet 10 mg  10 mg Oral Q breakfast Rai, Ripudeep K, MD   10 mg at 09/21/21 0840   rosuvastatin (CRESTOR) tablet 40 mg  40 mg Oral Daily Belva Crome,  MD   40 mg at 09/21/21 0841   sodium chloride flush (NS) 0.9 % injection 3 mL  3 mL Intravenous Q12H Belva Crome, MD   3 mL at 09/20/21 0857   sodium chloride flush (NS) 0.9 % injection 3 mL  3 mL Intravenous Q12H Belva Crome, MD       sodium chloride flush (NS) 0.9 % injection 3 mL  3 mL Intravenous Q12H Belva Crome, MD   3 mL at 09/21/21  3007   sodium chloride flush (NS) 0.9 % injection 3 mL  3 mL Intravenous PRN Belva Crome, MD         Discharge Medications: Please see discharge summary for a list of discharge medications.  Relevant Imaging Results:  Relevant Lab Results:   Additional Information SSN# [place], [id number]

## 2021-09-21 NOTE — Progress Notes (Signed)
Progress Note  Patient Name: Kurt Aguilar Date of Encounter: 09/21/2021  Primary Cardiologist:   [name], MD   Subjective   He is weak.  Continued cough.  Feels breathless with walking but sats OK.   Inpatient Medications    Scheduled Meds:  aspirin  81 mg Oral Daily   ezetimibe  10 mg Oral Daily   famotidine  20 mg Oral QHS   heparin  5,000 Units Subcutaneous Q8H   insulin aspart  0-15 Units Subcutaneous TID WC   insulin aspart  0-5 Units Subcutaneous QHS   insulin NPH Human  15 Units Subcutaneous QAC breakfast   metoprolol tartrate  25 mg Oral BID   pantoprazole  80 mg Oral BID AC   predniSONE  10 mg Oral BID WC   rosuvastatin  40 mg Oral Daily   sodium chloride flush  3 mL Intravenous Q12H   sodium chloride flush  3 mL Intravenous Q12H   sodium chloride flush  3 mL Intravenous Q12H   sodium zirconium cyclosilicate  10 g Oral Once   Continuous Infusions:  sodium chloride     PRN Meds: sodium chloride, acetaminophen, acetaminophen, hydrALAZINE, nitroGLYCERIN, ondansetron (ZOFRAN) IV, ondansetron (ZOFRAN) IV, sodium chloride flush   Vital Signs    Vitals:   09/20/21 1921 09/20/21 2317 09/21/21 0311 09/21/21 0727  BP: (!) 147/90 (!) 158/89 (!) 147/81 (!) 154/76  Pulse: 96 79 70 86  Resp: 20 20 16 18   Temp: 97.6 F (36.4 C) 98.4 F (36.9 C) 98.4 F (36.9 C) 97.7 F (36.5 C)  TempSrc: Oral Oral Oral Oral  SpO2: 97% 96% 96% 94%  Weight:   101.9 kg   Height:        Intake/Output Summary (Last 24 hours) at 09/21/2021 0758 Last data filed at 09/21/2021 9735 Gross per 24 hour  Intake 720 ml  Output 2250 ml  Net -1530 ml   Filed Weights   09/19/21 0438 09/20/21 0652 09/21/21 0311  Weight: 101.6 kg 103.4 kg 101.9 kg    Telemetry    Sinus tach with premature ectopy - Personally Reviewed  ECG    NA - Personally Reviewed  Physical Exam   GEN: No acute distress.   Neck: No  JVD Cardiac: RRR, no murmurs, rubs, or gallops.  Respiratory:  Clear  to auscultation bilaterally. GI: Soft, nontender, non-distended  MS:   Mild leg edema; No deformity. Neuro:  Nonfocal  Psych: Normal affect   Labs    Chemistry Recent Labs  Lab 09/17/21 2323 09/18/21 0606 09/19/21 0147 09/20/21 0100 09/21/21 0106  NA  --    < > 137 136 136  K  --    < > 4.4 4.2 5.9*  CL  --    < > 108 112* 106  CO2  --    < > 16* 18* 18*  GLUCOSE  --    < > 204* 140* 198*  BUN  --    < > 41* 36* 34*  CREATININE  --    < > 2.15* 1.86* 2.01*  CALCIUM  --    < > 8.6* 8.1* 8.8*  PROT 5.3*  --   --   --   --   ALBUMIN 3.0*  --   --   --   --   AST 25  --   --   --   --   ALT 36  --   --   --   --  ALKPHOS 33*  --   --   --   --   BILITOT 0.8  --   --   --   --   GFRNONAA  --    < > 31* 37* 34*  ANIONGAP  --    < > 13 6 12    < > = values in this interval not displayed.     Hematology Recent Labs  Lab 09/18/21 0606 09/19/21 0147 09/20/21 0100  WBC 11.4* 11.0* 9.9  RBC 3.98* 3.82* 3.61*  HGB 13.2 12.2* 11.4*  HCT 38.4* 38.1* 35.1*  MCV 96.5 99.7 97.2  MCH 33.2 31.9 31.6  MCHC 34.4 32.0 32.5  RDW 13.6 13.5 13.6  PLT 135* 127* 123*    Cardiac EnzymesNo results for input(s): TROPONINI in the last 168 hours. No results for input(s): TROPIPOC in the last 168 hours.   BNP Recent Labs  Lab 09/17/21 1731  BNP 37.7     DDimer  Recent Labs  Lab 09/17/21 1731  DDIMER 0.34     Radiology    CARDIAC CATHETERIZATION  Result Date: 09/19/2021 CONCLUSIONS: Severe diffuse calcified coronary disease of the native arteries with 100% proximal RCA, 90% ostial circumflex, 99% distal left main, severely and diffusely diseased proximal to mid LAD with threatened septal perforator. Diffusely diseased saphenous vein graft to the right coronary.  Ostial to proximal 70% stenosis. Widely patent sequential graft to first and second obtuse marginal Widely patent saphenous vein graft to the first diagonal Widely patent LIMA to the mid LAD Aortic valve is heavily  calcified, was difficult to cross, but without significant gradient.  LVEDP 10 mmHg. Total contrast 70 cc RECOMMENDATIONS: Continue IV fluids for 18 to 24 hours.  He was given an additional 250 cc of saline as a bolus after identifying that LVEDP was 9 mmHg. Monitor kidney function closely.  Contrast exposure was 70 cc. The saphenous vein graft to the right coronary can be stented although it is not so critical that it should be causing enzyme elevation.  Enzyme elevation likely related to left coronary native disease in the septal perforator territory.   DG CHEST PORT 1 VIEW  Result Date: 09/20/2021 CLINICAL DATA:  Chest pain, shortness of breath, fatigue EXAM: PORTABLE CHEST 1 VIEW COMPARISON:  Chest radiograph 09/17/2021 FINDINGS: Median sternotomy wires are again noted. The cardiomediastinal silhouette is stable. There is no focal consolidation or pulmonary edema. There is no pleural effusion or pneumothorax. Calcified pleural plaques are again noted in the left apex. There is no acute osseous abnormality. IMPRESSION: Stable chest with no radiographic evidence of acute cardiopulmonary process. Electronically Signed   By: Valetta Mole M.D.   On: 09/20/2021 10:08    Cardiac Studies   Echocardiogram 09/18/2021: Impressions: 1. Left ventricular ejection fraction, by estimation, is 60 to 65%. The  left ventricle has normal function. The left ventricle has no regional  wall motion abnormalities. Left ventricular diastolic parameters are  consistent with Grade I diastolic  dysfunction (impaired relaxation). Elevated left ventricular end-diastolic  pressure.   2. Right ventricular systolic function is moderately reduced. The right  ventricular size is normal.   3. The mitral valve is normal in structure. No evidence of mitral valve  regurgitation. No evidence of mitral stenosis.   4. The AV is poorly visualized to comment on structure. There does appear  to be come calcification of the valve. . The  aortic valve was not well  visualized. Aortic valve regurgitation is not visualized. No aortic  stenosis is present.  _______________   Left Cardiac Catheterization 09/19/2021: Conclusions: Severe diffuse calcified coronary disease of the native arteries with 100% proximal RCA, 90% ostial circumflex, 99% distal left main, severely and diffusely diseased proximal to mid LAD with threatened septal perforator. Diffusely diseased saphenous vein graft to the right coronary.  Ostial to proximal 70% stenosis. Widely patent sequential graft to first and second obtuse marginal Widely patent saphenous vein graft to the first diagonal Widely patent LIMA to the mid LAD Aortic valve is heavily calcified, was difficult to cross, but without significant gradient.  LVEDP 10 mmHg. Total contrast 70 cc   Recommendations: Continue IV fluids for 18 to 24 hours.  He was given an additional 250 cc of saline as a bolus after identifying that LVEDP was 9 mmHg. Monitor kidney function closely.  Contrast exposure was 70 cc. The saphenous vein graft to the right coronary can be stented although it is not so critical that it should be causing enzyme elevation.  Enzyme elevation likely related to left coronary native disease in the septal perforator territory.   Diagnostic Dominance: Right    Patient Profile     76 y.o. male with a history of CAD s/p CABG x5 in 1997, hypertension, hyperlipidemia, type 2 diabetes mellitus, CKD stage III-IV, sleep apnea on CPAP, and GERD who was admitted on 09/17/2021 with chest pain and shortness of breath and found to have elevated troponin concerning for NSTEMI.    Assessment & Plan    CAD/NSTEMI:  Demand ischemia.  Medical management.    SOB:   No evidence of HF.  Plan per primary.   DYSLIPIDEMIA:  Continue statin.   CKD IIIB:  Relatively stable.     HTN:  Norvasc suggested yesterday but not ordered.  I instead will increase the beta blocker.   We will see as needed.   Please call with further questions.   For questions or updates, please contact [place] Please consult [email or web address] for contact info under Cardiology/STEMI.   Signed, Minus Breeding, MD  09/21/2021, 7:58 AM

## 2021-09-21 NOTE — Plan of Care (Signed)
°  Problem: Education: Goal: Understanding of cardiac disease, CV risk reduction, and recovery process will improve 09/21/2021 2318 by Shanon Ace, RN Outcome: Progressing 09/21/2021 2317 by Shanon Ace, RN Outcome: Progressing Goal: Individualized Educational Video(s) 09/21/2021 2318 by Shanon Ace, RN Outcome: Progressing 09/21/2021 2317 by Shanon Ace, RN Outcome: Progressing   Problem: Activity: Goal: Ability to tolerate increased activity will improve 09/21/2021 2318 by Shanon Ace, RN Outcome: Progressing 09/21/2021 2317 by Shanon Ace, RN Outcome: Progressing   Problem: Cardiac: Goal: Ability to achieve and maintain adequate cardiovascular perfusion will improve 09/21/2021 2318 by Shanon Ace, RN Outcome: Progressing 09/21/2021 2317 by Shanon Ace, RN Outcome: Progressing   Problem: Health Behavior/Discharge Planning: Goal: Ability to safely manage health-related needs after discharge will improve 09/21/2021 2318 by Shanon Ace, RN Outcome: Progressing 09/21/2021 2317 by Shanon Ace, RN Outcome: Progressing   Problem: Education: Goal: Knowledge of General Education information will improve Description: Including pain rating scale, medication(s)/side effects and non-pharmacologic comfort measures Outcome: Progressing   Problem: Health Behavior/Discharge Planning: Goal: Ability to manage health-related needs will improve Outcome: Progressing   Problem: Clinical Measurements: Goal: Ability to maintain clinical measurements within normal limits will improve Outcome: Progressing Goal: Will remain free from infection Outcome: Progressing Goal: Diagnostic test results will improve Outcome: Progressing Goal: Respiratory complications will improve Outcome: Progressing Goal: Cardiovascular complication will be avoided Outcome: Progressing   Problem: Activity: Goal: Risk for activity intolerance will decrease Outcome: Progressing   Problem:  Nutrition: Goal: Adequate nutrition will be maintained Outcome: Progressing   Problem: Coping: Goal: Level of anxiety will decrease Outcome: Progressing   Problem: Elimination: Goal: Will not experience complications related to bowel motility Outcome: Progressing Goal: Will not experience complications related to urinary retention Outcome: Progressing   Problem: Pain Managment: Goal: General experience of comfort will improve Outcome: Progressing   Problem: Safety: Goal: Ability to remain free from injury will improve Outcome: Progressing   Problem: Skin Integrity: Goal: Risk for impaired skin integrity will decrease Outcome: Progressing

## 2021-09-21 NOTE — Progress Notes (Signed)
°   09/21/21 1200  Clinical Encounter Type  Visited With Patient and family together  Visit Type Initial;Spiritual support  Referral From Patient  Consult/Referral To Faith community    Patient is Catholic and requesting communion. Chaplain will follow up with our department to see about facilitating this. Chaplain introduced spiritual care services. Spiritual care services available as needed.   Jeri Lager, Chaplain

## 2021-09-21 NOTE — Progress Notes (Addendum)
[facility]                                                                              Patient Demographics  Kurt Aguilar, is a 76 y.o. male, DOB - Jan 14, 1946, KVQ:[id number]  Admit date - 09/17/2021   Admitting Physician Etta Quill, DO  Outpatient Primary MD for the patient is Tower, [name], MD  Outpatient specialists:   LOS - 4  days   Medical records reviewed and are as summarized below:    Chief Complaint  Patient presents with   Chest Pain   Shortness of Breath   Fatigue       Brief summary   Patient is a 76 year old male with history of CAD status post CABG in 1997, DM2, HTN, HLD, Boop, recent COVID-19 infection in December 2022, chronic steroid dependency presented to ED with mild intermittent chest pain for a month, worse with exertion, dyspnea.  Patient was found to have elevated troponins and was admitted for further work-up   Assessment & Plan    Principal Problem:   NSTEMI [facility]) with known history of CAD status post CABG x5 in 1997 -2D echo showed no wall motion abnormality, normal EF, likely demand ischemia -Left heart cardiac cath showed multivessel native coronary artery diseasesaphenous vein to right coronary artery with 70% ostial to proximal stenosis, SVG to OM 1 and 2 patent, SVG to first diagonal patent, LIMA to LAD patent.  -Cardiology following, recommended medical management, continue aspirin, BB, Crestor, Zetia   Active Problems:   Essential hypertension -Continue metoprolol, add amlodipine if additional therapy needed  Hyperkalemia -Potassium 5.9, with non-anion gap acidosis with mild elevation in creatinine today -Lokelma 10 g x 1  Acute on chronic CKD stage IIIb, lactic acidosis -Creatinine 2.34 on admission, baseline around 1.8 -Creatinine was 1.8 on 1/13 however trended up to 2.0 today, will give Ringer lactate infusion x 24 hours  Shortness of breath with cough -Per patient 4 weeks, dry persistent cough,  possibly due to COVID-19 in December (positive on 12/4), underlying history of Boop.   -Chest x-ray with no pneumonia or effusion.  VQ scan negative for PE.  Normal BNP -Placed on Tessalon Perles, Robitussin with codeine, currently on prednisone 10 mg twice daily, patient requested to change to daily  Hyperlipidemia -LDL 74 in 04/2021.  Continue Crestor 40 mg daily, Zetia 10 mg daily  Diabetes mellitus type 2, on long-term insulin with renal complications -Hemoglobin A1c 9.5 on 09/13/2021 -Continue sliding scale insulin moderate, on NPH insulin 15 units daily before breakfast -Steroids tapered today, follow CBGs closely.  Recent Labs    09/19/21 2118 09/20/21 0623 09/20/21 1109 09/20/21 1600 09/20/21 2114 09/21/21 0608  GLUCAP 265* 141* 213* 186* 189* 166*    BOOP - follows Dr [name] outpatient, continue prednisone,, taper to 10 mg daily    Obesity Estimated body mass index is 32.23 kg/m as calculated from the following:   Height as of this encounter: 5\' 10"  (1.778 m).   Weight as of this encounter: 101.9 kg.  Code Status: Full code DVT Prophylaxis:  heparin injection 5,000 Units Start: 09/19/21 2200 SCD's Start:  09/19/21 1110   Level of Care: Level of care: Progressive Family Communication: Discussed all imaging results, lab results, explained to the patient   Disposition Plan:     Status is: Inpatient  Remains inpatient appropriate because awaiting CIR. Per patient he is from [facility] independent facility, apprehensive of going back due to COVID outbreak  Time Spent in minutes   35 minutes  Procedures:  2D echo Cardiac cath  Consultants:   Cardiology CIR  Antimicrobials:   Anti-infectives (From admission, onward)    None          Medications  Scheduled Meds:  aspirin  81 mg Oral Daily   benzonatate  200 mg Oral TID   ezetimibe  10 mg Oral Daily   famotidine  20 mg Oral QHS   heparin  5,000 Units Subcutaneous Q8H   insulin aspart  0-15 Units  Subcutaneous TID WC   insulin aspart  0-5 Units Subcutaneous QHS   insulin NPH Human  15 Units Subcutaneous QAC breakfast   metoprolol tartrate  50 mg Oral BID   pantoprazole  80 mg Oral BID AC   predniSONE  10 mg Oral Q breakfast   rosuvastatin  40 mg Oral Daily   sodium chloride flush  3 mL Intravenous Q12H   sodium chloride flush  3 mL Intravenous Q12H   sodium chloride flush  3 mL Intravenous Q12H   Continuous Infusions:  sodium chloride     lactated ringers 75 mL/hr at 09/21/21 0855   PRN Meds:.sodium chloride, acetaminophen, acetaminophen, guaiFENesin-codeine, hydrALAZINE, nitroGLYCERIN, ondansetron (ZOFRAN) IV, ondansetron (ZOFRAN) IV, sodium chloride flush      Subjective:   Kurt Aguilar was seen and examined today.  Currently main complaint is cough and shortness of breath with ambulation.  Cough is dry, no fevers or chills.  Patient denies dizziness,  abdominal pain, N/V. No acute events overnight.    Objective:   Vitals:   09/20/21 1921 09/20/21 2317 09/21/21 0311 09/21/21 0727  BP: (!) 147/90 (!) 158/89 (!) 147/81 (!) 154/76  Pulse: 96 79 70 86  Resp: 20 20 16 18   Temp: 97.6 F (36.4 C) 98.4 F (36.9 C) 98.4 F (36.9 C) 97.7 F (36.5 C)  TempSrc: Oral Oral Oral Oral  SpO2: 97% 96% 96% 94%  Weight:   101.9 kg   Height:        Intake/Output Summary (Last 24 hours) at 09/21/2021 1106 Last data filed at 09/21/2021 0800 Gross per 24 hour  Intake 960 ml  Output 2250 ml  Net -1290 ml     Wt Readings from Last 3 Encounters:  09/21/21 101.9 kg  09/13/21 103.4 kg  08/26/21 106.8 kg     Exam General: Alert and oriented x 3, NAD Cardiovascular: S1 S2 auscultated, no murmurs, RRR Respiratory: Clear to auscultation bilaterally, no wheezing Gastrointestinal: Soft, nontender, nondistended, + bowel sounds Ext: trace pedal edema bilaterally Neuro: no new FND's Psych: Normal affect and demeanor, alert and oriented x3    Data Reviewed:  I have personally  reviewed following labs and imaging studies  Micro Results Recent Results (from the past 240 hour(s))  Resp Panel by RT-PCR (Flu A&B, Covid) Nasopharyngeal Swab     Status: None   Collection Time: 09/17/21  5:09 PM   Specimen: Nasopharyngeal Swab; Nasopharyngeal(NP) swabs in vial transport medium  Result Value Ref Range Status   SARS Coronavirus 2 by RT PCR NEGATIVE NEGATIVE Final    Comment: (NOTE) SARS-CoV-2 target nucleic acids are  NOT DETECTED.  The SARS-CoV-2 RNA is generally detectable in upper respiratory specimens during the acute phase of infection. The lowest concentration of SARS-CoV-2 viral copies this assay can detect is 138 copies/mL. A negative result does not preclude SARS-Cov-2 infection and should not be used as the sole basis for treatment or other patient management decisions. A negative result may occur with  improper specimen collection/handling, submission of specimen other than nasopharyngeal swab, presence of viral mutation(s) within the areas targeted by this assay, and inadequate number of viral copies(<138 copies/mL). A negative result must be combined with clinical observations, patient history, and epidemiological information. The expected result is Negative.  Fact Sheet for Patients:  EntrepreneurPulse.com.au  Fact Sheet for Healthcare Providers:  IncredibleEmployment.be  This test is no t yet approved or cleared by the [place] FDA and  has been authorized for detection and/or diagnosis of SARS-CoV-2 by FDA under an Emergency Use Authorization (EUA). This EUA will remain  in effect (meaning this test can be used) for the duration of the COVID-19 declaration under Section 564(b)(1) of the Act, 21 U.S.C.section 360bbb-3(b)(1), unless the authorization is terminated  or revoked sooner.       Influenza A by PCR NEGATIVE NEGATIVE Final   Influenza B by PCR NEGATIVE NEGATIVE Final    Comment: (NOTE) The  Xpert Xpress SARS-CoV-2/FLU/RSV plus assay is intended as an aid in the diagnosis of influenza from Nasopharyngeal swab specimens and should not be used as a sole basis for treatment. Nasal washings and aspirates are unacceptable for Xpert Xpress SARS-CoV-2/FLU/RSV testing.  Fact Sheet for Patients: EntrepreneurPulse.com.au  Fact Sheet for Healthcare Providers: IncredibleEmployment.be  This test is not yet approved or cleared by the [place] FDA and has been authorized for detection and/or diagnosis of SARS-CoV-2 by FDA under an Emergency Use Authorization (EUA). This EUA will remain in effect (meaning this test can be used) for the duration of the COVID-19 declaration under Section 564(b)(1) of the Act, 21 U.S.C. section 360bbb-3(b)(1), unless the authorization is terminated or revoked.  Performed at [facility], [place] [address] [place], [place] [address]   MRSA Next Gen by PCR, Nasal     Status: None   Collection Time: 09/18/21  3:35 AM   Specimen: Nasal Mucosa; Nasal Swab  Result Value Ref Range Status   MRSA by PCR Next Gen NOT DETECTED NOT DETECTED Final    Comment: (NOTE) The GeneXpert MRSA Assay (FDA approved for NASAL specimens only), is one component of a comprehensive MRSA colonization surveillance program. It is not intended to diagnose MRSA infection nor to guide or monitor treatment for MRSA infections. Test performance is not FDA approved in patients less than 27 years old. Performed at [facility], [place] [address], [place], [place] [address]   Culture, blood (routine x 2)     Status: None (Preliminary result)   Collection Time: 09/18/21  6:06 AM   Specimen: BLOOD RIGHT FOREARM  Result Value Ref Range Status   Specimen Description BLOOD RIGHT FOREARM  Final   Special Requests   Final    BOTTLES DRAWN AEROBIC AND ANAEROBIC Blood Culture adequate volume   Culture   Final    NO GROWTH 2 DAYS Performed at  [facility], [place] [address] [place], [place] [address]    Report Status PENDING  Incomplete  Culture, blood (routine x 2)     Status: None (Preliminary result)   Collection Time: 09/18/21  6:11 AM  Specimen: BLOOD RIGHT HAND  Result Value Ref Range Status   Specimen Description BLOOD RIGHT HAND  Final   Special Requests   Final    BOTTLES DRAWN AEROBIC AND ANAEROBIC Blood Culture adequate volume   Culture   Final    NO GROWTH 2 DAYS Performed at [facility], [address], [place], [place] [address]    Report Status PENDING  Incomplete    Radiology Reports DG Chest 1 View  Result Date: 09/17/2021 CLINICAL DATA:  Chest pressure and shortness of breath. EXAM: CHEST  1 VIEW COMPARISON:  Chest radiograph 08/26/2021, CT chest 12/19/2015 FINDINGS: Status post median sternotomy. Cardiac silhouette is again at the upper limits of normal size. Mild to moderate calcifications within the aortic arch. There are again calcified nodular densities within left lung apex, unchanged. These are consistent with the calcified pleural plaques seen on prior CT. No focal airspace opacity to indicate pneumonia. No pleural effusion or pneumothorax. No acute skeletal abnormality. IMPRESSION: No acute lung process.  Unchanged chronic findings as above. Electronically Signed   By: Yvonne Kendall   On: 09/17/2021 13:04   DG Chest 2 View  Result Date: 08/26/2021 CLINICAL DATA:  COVID infection. EXAM: CHEST - 2 VIEW COMPARISON:  December 11, 2020 FINDINGS: No pneumothorax. Calcified nodules in the lateral left apex, unchanged. The cardiomediastinal silhouette is stable. Intact sternotomy wires. No suspicious nodules or masses. No focal infiltrates. Tiny left effusion versus pleural thickening, unchanged. IMPRESSION: No acute abnormalities or changes. Electronically Signed   By: Dorise Bullion III M.D.   On: 08/26/2021 13:57   NM Pulmonary Perfusion  Result Date: 09/17/2021 CLINICAL DATA:  Pulmonary  embolism (PE) suspected, high prob. Shortness of breath, chest pain EXAM: NUCLEAR MEDICINE PERFUSION LUNG SCAN TECHNIQUE: Perfusion images were obtained in multiple projections after intravenous injection of radiopharmaceutical. Ventilation scans intentionally deferred if perfusion scan and chest x-ray adequate for interpretation during COVID 19 epidemic. RADIOPHARMACEUTICALS:  4.3 mCi Tc-93m MAA IV COMPARISON:  Chest x-ray today FINDINGS: No perfusion defects to suggest pulmonary embolus. IMPRESSION: No evidence of pulmonary embolus. Electronically Signed   By: Rolm Baptise M.D.   On: 09/17/2021 20:06   CARDIAC CATHETERIZATION  Result Date: 09/19/2021 CONCLUSIONS: Severe diffuse calcified coronary disease of the native arteries with 100% proximal RCA, 90% ostial circumflex, 99% distal left main, severely and diffusely diseased proximal to mid LAD with threatened septal perforator. Diffusely diseased saphenous vein graft to the right coronary.  Ostial to proximal 70% stenosis. Widely patent sequential graft to first and second obtuse marginal Widely patent saphenous vein graft to the first diagonal Widely patent LIMA to the mid LAD Aortic valve is heavily calcified, was difficult to cross, but without significant gradient.  LVEDP 10 mmHg. Total contrast 70 cc RECOMMENDATIONS: Continue IV fluids for 18 to 24 hours.  He was given an additional 250 cc of saline as a bolus after identifying that LVEDP was 9 mmHg. Monitor kidney function closely.  Contrast exposure was 70 cc. The saphenous vein graft to the right coronary can be stented although it is not so critical that it should be causing enzyme elevation.  Enzyme elevation likely related to left coronary native disease in the septal perforator territory.   DG CHEST PORT 1 VIEW  Result Date: 09/20/2021 CLINICAL DATA:  Chest pain, shortness of breath, fatigue EXAM: PORTABLE CHEST 1 VIEW COMPARISON:  Chest radiograph 09/17/2021 FINDINGS: Median sternotomy wires  are again noted. The cardiomediastinal silhouette is stable. There is  no focal consolidation or pulmonary edema. There is no pleural effusion or pneumothorax. Calcified pleural plaques are again noted in the left apex. There is no acute osseous abnormality. IMPRESSION: Stable chest with no radiographic evidence of acute cardiopulmonary process. Electronically Signed   By: Valetta Mole M.D.   On: 09/20/2021 10:08   ECHOCARDIOGRAM COMPLETE  Result Date: 09/18/2021    ECHOCARDIOGRAM REPORT   Patient Name:   Kurt Aguilar [facility] Date of Exam: 09/18/2021 Medical Rec #:  [id number]        Height:       70.0 in Accession #:    [id number]       Weight:       225.3 lb Date of Birth:  1945/11/01        BSA:          2.196 m Patient Age:    11 years         BP:           165/90 mmHg Patient Gender: M                HR:           97 bpm. Exam Location:  Inpatient Procedure: 2D Echo, Cardiac Doppler, Color Doppler and Intracardiac            Opacification Agent Indications:    Acute myocardial infarction  History:        Patient has prior history of Echocardiogram examinations, most                 recent 05/22/2009. Prior CABG; Risk Factors:Diabetes and                 Hypertension. CKD. GERD.  Sonographer:    [name] Lefort RDCS (AE) Referring Phys: [id number] JARED M GARDNER  Sonographer Comments: Technically difficult study due to poor echo windows and no subcostal window. IMPRESSIONS  1. Left ventricular ejection fraction, by estimation, is 60 to 65%. The left ventricle has normal function. The left ventricle has no regional wall motion abnormalities. Left ventricular diastolic parameters are consistent with Grade I diastolic dysfunction (impaired relaxation). Elevated left ventricular end-diastolic pressure.  2. Right ventricular systolic function is moderately reduced. The right ventricular size is normal.  3. The mitral valve is normal in structure. No evidence of mitral valve regurgitation. No evidence of mitral stenosis.  4. The AV  is poorly visualized to comment on structure. There does appear to be come calcification of the valve. . The aortic valve was not well visualized. Aortic valve regurgitation is not visualized. No aortic stenosis is present. FINDINGS  Left Ventricle: Left ventricular ejection fraction, by estimation, is 60 to 65%. The left ventricle has normal function. The left ventricle has no regional wall motion abnormalities. Definity contrast agent was given IV to delineate the left ventricular  endocardial borders. The left ventricular internal cavity size was normal in size. There is no left ventricular hypertrophy. Left ventricular diastolic parameters are consistent with Grade I diastolic dysfunction (impaired relaxation). Elevated left ventricular end-diastolic pressure. Right Ventricle: The right ventricular size is normal. No increase in right ventricular wall thickness. Right ventricular systolic function is moderately reduced. Left Atrium: Left atrial size was normal in size. Right Atrium: Right atrial size was normal in size. Pericardium: There is no evidence of pericardial effusion. Mitral Valve: The mitral valve is normal in structure. No evidence of mitral valve regurgitation. No evidence of mitral valve stenosis. Tricuspid Valve: The tricuspid valve  is normal in structure. Tricuspid valve regurgitation is mild . No evidence of tricuspid stenosis. Aortic Valve: The AV is poorly visualized to comment on structure. There does appear to be come calcification of the valve. The aortic valve was not well visualized. Aortic valve regurgitation is not visualized. No aortic stenosis is present. Aortic valve mean gradient measures 3.0 mmHg. Aortic valve peak gradient measures 4.8 mmHg. Aortic valve area, by VTI measures 2.47 cm. Pulmonic Valve: The pulmonic valve was normal in structure. Pulmonic valve regurgitation is not visualized. No evidence of pulmonic stenosis. Aorta: The aortic root is normal in size and structure.  Venous: The inferior vena cava was not well visualized. IAS/Shunts: No atrial level shunt detected by color flow Doppler.  LEFT VENTRICLE PLAX 2D LVOT diam:     1.90 cm   Diastology LV SV:         52        LV e' medial:    5.66 cm/s LV SV Index:   24        LV E/e' medial:  14.8 LVOT Area:     2.84 cm  LV e' lateral:   6.09 cm/s                          LV E/e' lateral: 13.7  RIGHT VENTRICLE RV S prime:     10.30 cm/s TAPSE (M-mode): 1.3 cm LEFT ATRIUM             Index        RIGHT ATRIUM           Index LA Vol (A2C):   37.4 ml 17.03 ml/m  RA Area:     14.40 cm LA Vol (A4C):   69.8 ml 31.79 ml/m  RA Volume:   31.30 ml  14.25 ml/m LA Biplane Vol: 54.3 ml 24.73 ml/m  AORTIC VALVE AV Area (Vmax):    2.51 cm AV Area (Vmean):   2.75 cm AV Area (VTI):     2.47 cm AV Vmax:           110.00 cm/s AV Vmean:          77.800 cm/s AV VTI:            0.210 m AV Peak Grad:      4.8 mmHg AV Mean Grad:      3.0 mmHg LVOT Vmax:         97.50 cm/s LVOT Vmean:        75.400 cm/s LVOT VTI:          0.183 m LVOT/AV VTI ratio: 0.87  AORTA Ao Root diam: 2.70 cm Ao Asc diam:  2.90 cm MITRAL VALVE                TRICUSPID VALVE MV Area (PHT): 2.05 cm     TR Peak grad:   18.5 mmHg MV Decel Time: 370 msec     TR Vmax:        215.00 cm/s MV E velocity: 83.70 cm/s MV A velocity: 163.00 cm/s  SHUNTS MV E/A ratio:  0.51         Systemic VTI:  0.18 m                             Systemic Diam: 1.90 cm Fransico Him MD Electronically signed by Fransico Him MD Signature Date/Time: 09/18/2021/11:30:29 AM  Final    VAS [place] LOWER EXTREMITY VENOUS (DVT) (ONLY [facility] & [facility])  Result Date: 09/17/2021  Lower Venous DVT Study Patient Name:  Kurt Aguilar [facility]  Date of Exam:   09/17/2021 Medical Rec #: [id number]         Accession #:    [id number] Date of Birth: 04/01/1946         Patient Gender: M Patient Age:   71 years Exam Location:  [facility] Procedure:      VAS [place] LOWER EXTREMITY VENOUS (DVT) Referring Phys: BRITNI HENDERLY  --------------------------------------------------------------------------------  Indications: Edema.  Comparison Study: No prior study Performing Technologist: Maudry Mayhew MHA, RDMS, RVT, RDCS  Examination Guidelines: A complete evaluation includes B-mode imaging, spectral Doppler, color Doppler, and power Doppler as needed of all accessible portions of each vessel. Bilateral testing is considered an integral part of a complete examination. Limited examinations for reoccurring indications may be performed as noted. The reflux portion of the exam is performed with the patient in reverse Trendelenburg.  +---------+---------------+---------+-----------+----------+--------------+  RIGHT     Compressibility Phasicity Spontaneity Properties Thrombus Aging  +---------+---------------+---------+-----------+----------+--------------+  CFV       Full            Yes       Yes                                    +---------+---------------+---------+-----------+----------+--------------+  SFJ       Full                                                             +---------+---------------+---------+-----------+----------+--------------+  FV Prox   Full                                                             +---------+---------------+---------+-----------+----------+--------------+  FV Mid    Full                                                             +---------+---------------+---------+-----------+----------+--------------+  FV Distal Full                                                             +---------+---------------+---------+-----------+----------+--------------+  PFV       Full                                                             +---------+---------------+---------+-----------+----------+--------------+  POP  Full            Yes       Yes                                    +---------+---------------+---------+-----------+----------+--------------+  PTV       Full                                                              +---------+---------------+---------+-----------+----------+--------------+  PERO      Full                                                             +---------+---------------+---------+-----------+----------+--------------+   +---------+---------------+---------+-----------+----------+--------------+  LEFT      Compressibility Phasicity Spontaneity Properties Thrombus Aging  +---------+---------------+---------+-----------+----------+--------------+  CFV       Full            Yes       Yes                                    +---------+---------------+---------+-----------+----------+--------------+  SFJ       Full                                                             +---------+---------------+---------+-----------+----------+--------------+  FV Prox   Full                                                             +---------+---------------+---------+-----------+----------+--------------+  FV Mid    Full                                                             +---------+---------------+---------+-----------+----------+--------------+  FV Distal Full                                                             +---------+---------------+---------+-----------+----------+--------------+  PFV       Full                                                             +---------+---------------+---------+-----------+----------+--------------+  POP       Full            Yes       Yes                                    +---------+---------------+---------+-----------+----------+--------------+  PTV       Full                                                             +---------+---------------+---------+-----------+----------+--------------+  PERO      Full                                                             +---------+---------------+---------+-----------+----------+--------------+     Summary: RIGHT: - There is no evidence of deep vein thrombosis in the lower  extremity.  - No cystic structure found in the popliteal fossa.  LEFT: - There is no evidence of deep vein thrombosis in the lower extremity.  - No cystic structure found in the popliteal fossa.  *See table(s) above for measurements and observations. Electronically signed by Harold Barban MD on 09/17/2021 at 9:42:23 PM.    Final     Lab Data:  CBC: Recent Labs  Lab 09/17/21 1247 09/17/21 1856 09/18/21 0606 09/19/21 0147 09/20/21 0100  WBC 13.7*  --  11.4* 11.0* 9.9  HGB 14.3 11.9* 13.2 12.2* 11.4*  HCT 44.0 35.0* 38.4* 38.1* 35.1*  MCV 100.5*  --  96.5 99.7 97.2  PLT 172  --  135* 127* 941*   Basic Metabolic Panel: Recent Labs  Lab 09/17/21 1247 09/17/21 1856 09/18/21 0606 09/19/21 0147 09/20/21 0100 09/21/21 0106  NA 137 138 136 137 136 136  K 5.4* 4.6 4.6 4.4 4.2 5.9*  CL 104  --  108 108 112* 106  CO2 15*  --  18* 16* 18* 18*  GLUCOSE 268*  --  165* 204* 140* 198*  BUN 60*  --  50* 41* 36* 34*  CREATININE 2.34*  --  1.95* 2.15* 1.86* 2.01*  CALCIUM 9.6  --  8.9 8.6* 8.1* 8.8*   GFR: Estimated Creatinine Clearance: 36.8 mL/min (A) (by C-G formula based on SCr of 2.01 mg/dL (H)). Liver Function Tests: Recent Labs  Lab 09/17/21 2323  AST 25  ALT 36  ALKPHOS 33*  BILITOT 0.8  PROT 5.3*  ALBUMIN 3.0*   No results for input(s): LIPASE, AMYLASE in the last 168 hours. No results for input(s): AMMONIA in the last 168 hours. Coagulation Profile: No results for input(s): INR, PROTIME in the last 168 hours. Cardiac Enzymes: No results for input(s): CKTOTAL, CKMB, CKMBINDEX, TROPONINI in the last 168 hours. BNP (last 3 results) No results for input(s): PROBNP in the last 8760 hours. HbA1C: No results for input(s): HGBA1C in the last 72 hours. CBG: Recent Labs  Lab 09/20/21 0623 09/20/21 1109 09/20/21 1600 09/20/21 2114 09/21/21 0608  GLUCAP 141* 213* 186* 189* 166*   Lipid Profile: No results for input(s): CHOL, HDL, LDLCALC, TRIG, CHOLHDL, LDLDIRECT in the  last 72 hours. Thyroid Function Tests: No results for input(s): TSH, T4TOTAL, FREET4, T3FREE, THYROIDAB in the last 72 hours. Anemia Panel: No results for input(s): VITAMINB12, FOLATE, FERRITIN, TIBC, IRON, RETICCTPCT in the last 72 hours. Urine analysis:    Component Value Date/Time   COLORURINE YELLOW 09/18/2021 0259   APPEARANCEUR CLEAR 09/18/2021 0259   LABSPEC 1.020 09/18/2021 0259   PHURINE 5.0 09/18/2021 0259   GLUCOSEU >=500 (A) 09/18/2021 0259   GLUCOSEU NEGATIVE 11/25/2010 0951   HGBUR NEGATIVE 09/18/2021 0259   BILIRUBINUR NEGATIVE 09/18/2021 0259   BILIRUBINUR neg. 08/30/2012 [place] 09/18/2021 0259   PROTEINUR NEGATIVE 09/18/2021 0259   UROBILINOGEN 0.2 08/30/2012 1548   UROBILINOGEN 0.2 11/25/2010 0951   NITRITE NEGATIVE 09/18/2021 0259   LEUKOCYTESUR NEGATIVE 09/18/2021 0259     Kurt Aguilar M.D. [facility] 09/21/2021, 11:06 AM  Available via Epic secure chat 7am-7pm After 7 pm, please refer to night coverage provider listed on amion.

## 2021-09-21 NOTE — Plan of Care (Signed)

## 2021-09-22 LAB — BASIC METABOLIC PANEL
Anion gap: 9 (ref 5–15)
BUN: 33 mg/dL — ABNORMAL HIGH (ref 8–23)
CO2: 20 mmol/L — ABNORMAL LOW (ref 22–32)
Calcium: 9 mg/dL (ref 8.9–10.3)
Chloride: 110 mmol/L (ref 98–111)
Creatinine, Ser: 1.92 mg/dL — ABNORMAL HIGH (ref 0.61–1.24)
GFR, Estimated: 35 mL/min — ABNORMAL LOW (ref >60)
Glucose, Bld: 133 mg/dL — ABNORMAL HIGH (ref 70–99)
Potassium: 4.2 mmol/L (ref 3.5–5.1)
Sodium: 139 mmol/L (ref 135–145)

## 2021-09-22 LAB — GLUCOSE, CAPILLARY
Glucose-Capillary: 113 mg/dL — ABNORMAL HIGH (ref 70–99)
Glucose-Capillary: 205 mg/dL — ABNORMAL HIGH (ref 70–99)
Glucose-Capillary: 381 mg/dL — ABNORMAL HIGH (ref 70–99)
Glucose-Capillary: 262 mg/dL — ABNORMAL HIGH (ref 70–99)

## 2021-09-22 MED ORDER — PREDNISONE 20 MG PO TABS
40.0000 mg | ORAL_TABLET | Freq: Every day | ORAL | Status: DC
  Administered 2021-09-22 – 2021-09-25 (×4): 40 mg via ORAL
  Filled 2021-09-22 (×5): qty 2

## 2021-09-22 MED ORDER — IPRATROPIUM-ALBUTEROL 0.5-2.5 (3) MG/3ML IN SOLN
3.0000 mL | Freq: Three times a day (TID) | RESPIRATORY_TRACT | Status: DC
  Administered 2021-09-22 (×2): 3 mL via RESPIRATORY_TRACT
  Filled 2021-09-22 (×2): qty 3

## 2021-09-22 MED ORDER — IPRATROPIUM-ALBUTEROL 0.5-2.5 (3) MG/3ML IN SOLN: 3.0000 mL | RESPIRATORY_TRACT | Status: DC | PRN

## 2021-09-22 NOTE — Progress Notes (Signed)
SATURATION QUALIFICATIONS: (This note is used to comply with regulatory documentation for home oxygen)  Patient Saturations on Room Air at Rest =95%  Patient Saturations on Room Air while Ambulating = 96%   Pt ambulated in a hallway this afternoon in a RA and his saturation was 96%. Pt felt some coughing and SOB because of that but his saturation maintained at 96%. Denied chest pain and distress.   Palma Holter, RN

## 2021-09-22 NOTE — Progress Notes (Signed)
[facility]                                                                              Patient Demographics  Kurt Aguilar, is a 76 y.o. male, DOB - 29-Aug-1946, KAJ:[id number]  Admit date - 09/17/2021   Admitting Physician Etta Quill, DO  Outpatient Primary MD for the patient is Tower, Kurt Fanny, MD  Outpatient specialists:   LOS - 5  days   Medical records reviewed and are as summarized below:    Chief Complaint  Patient presents with   Chest Pain   Shortness of Breath   Fatigue       Brief summary   Patient is a 76 year old male with history of CAD status post CABG in 1997, DM2, HTN, HLD, Boop, recent COVID-19 infection in December 2022, chronic steroid dependency presented to ED with mild intermittent chest pain for a month, worse with exertion, dyspnea.  Patient was found to have elevated troponins and was admitted for further work-up   Assessment & Plan    Principal Problem:   NSTEMI ([place]) with known history of CAD status post CABG x5 in 1997 -2D echo showed no wall motion abnormality, normal EF, likely demand ischemia -Left heart cardiac cath showed multivessel native coronary artery diseasesaphenous vein to right coronary artery with 70% ostial to proximal stenosis, SVG to OM 1 and 2 patent, SVG to first diagonal patent, LIMA to LAD patent.  -Continue aspirin, beta-blocker, Crestor, Zetia, cardiology following.     Active Problems:   Essential hypertension -BP currently stable, continue beta-blocker.  Add amlodipine if additional therapy needed   Hyperkalemia -Resolved  Acute on chronic CKD stage IIIb, lactic acidosis -Creatinine 2.34 on admission, baseline around 1.8 -Creatinine improving, 1.9 today, complete IV fluid hydration   Shortness of breath with cough -Per patient 4 weeks, dry persistent cough, possibly due to COVID-19 in December (positive on 12/4), underlying history of BOOP -CXR with no pneumonia or effusion.  VQ scan  negative for PE.  Normal BNP -Continues to feel winded with exertion, coughing spasms. -Will increase prednisone to 40 mg daily with taper, placed on scheduled nebs, continue incentive spirometry, Tessalon Perles, Robitussin with codeine  Hyperlipidemia -LDL 74 in 04/2021.  Continue Crestor 40 mg daily, Zetia 10 mg daily  Diabetes mellitus type 2, on long-term insulin with renal complications -Hemoglobin A1c 9.5 on 09/13/2021 -CBGs are expected to rise with increasing prednisone.  Fasting CBG stable this a.m., will adjust insulin according to CBG readings. -Continue moderate sliding scale insulin, NPH 15 units daily  Recent Labs    09/20/21 2114 09/21/21 0608 09/21/21 1125 09/21/21 1623 09/21/21 2110 09/22/21 0604  GLUCAP 189* 166* 244* 252* 183* 113*      BOOP - follows Dr [name] outpatient, continue prednisone   Obesity Estimated body mass index is 32.39 kg/m as calculated from the following:   Height as of this encounter: 5\' 10"  (1.778 m).   Weight as of this encounter: 102.4 kg.  Code Status: Full code DVT Prophylaxis:  heparin injection 5,000 Units Start: 09/19/21 2200 SCD's Start: 09/19/21 1110   Level of Care: Level of  care: Progressive Family Communication: Updated patient's wife at the bedside  Disposition Plan:     Status is: Inpatient  Remains inpatient appropriate because awaiting CIR. Per patient he is from [facility] independent facility, apprehensive of going back due to COVID outbreak  Time Spent in minutes 35 minutes  Procedures:  2D echo Cardiac cath  Consultants:   Cardiology CIR  Antimicrobials:   Anti-infectives (From admission, onward)    None          Medications  Scheduled Meds:  aspirin  81 mg Oral Daily   benzonatate  200 mg Oral TID   ezetimibe  10 mg Oral Daily   famotidine  20 mg Oral QHS   heparin  5,000 Units Subcutaneous Q8H   insulin aspart  0-15 Units Subcutaneous TID WC   insulin aspart  0-5 Units Subcutaneous  QHS   insulin NPH Human  15 Units Subcutaneous QAC breakfast   metoprolol tartrate  50 mg Oral BID   pantoprazole  80 mg Oral BID AC   predniSONE  40 mg Oral Q breakfast   rosuvastatin  40 mg Oral Daily   sodium chloride flush  3 mL Intravenous Q12H   sodium chloride flush  3 mL Intravenous Q12H   sodium chloride flush  3 mL Intravenous Q12H   Continuous Infusions:  sodium chloride     PRN Meds:.sodium chloride, acetaminophen, acetaminophen, guaiFENesin-codeine, hydrALAZINE, nitroGLYCERIN, ondansetron (ZOFRAN) IV, ondansetron (ZOFRAN) IV, sodium chloride flush      Subjective:   Giuseppe Duchemin was seen and examined today.  Still feels coughing spasms, short of breath and winded on exertion.  No fevers or chills.  No acute events overnight.  Objective:   Vitals:   09/22/21 0302 09/22/21 0553 09/22/21 0715 09/22/21 1107  BP: (!) 151/77  131/87 140/69  Pulse: 62  88 62  Resp: 19  15 18   Temp: 97.8 F (36.6 C)  98.2 F (36.8 C) 98.3 F (36.8 C)  TempSrc: Oral  Oral Oral  SpO2: 96%  94% 95%  Weight:  102.4 kg    Height:        Intake/Output Summary (Last 24 hours) at 09/22/2021 1112 Last data filed at 09/22/2021 1100 Gross per 24 hour  Intake 2111.78 ml  Output 2700 ml  Net -588.22 ml     Wt Readings from Last 3 Encounters:  09/22/21 102.4 kg  09/13/21 103.4 kg  08/26/21 106.8 kg   Physical Exam General: Alert and oriented x 3, NAD Cardiovascular: S1 S2 clear, RRR. No pedal edema b/l Respiratory: Bilateral mild scattered wheezing Gastrointestinal: Soft, nontender, nondistended, NBS Ext: no pedal edema bilaterally Psych: Normal affect and demeanor, alert and oriented x3   Data Reviewed:  I have personally reviewed following labs and imaging studies  Micro Results Recent Results (from the past 240 hour(s))  Resp Panel by RT-PCR (Flu A&B, Covid) Nasopharyngeal Swab     Status: None   Collection Time: 09/17/21  5:09 PM   Specimen: Nasopharyngeal Swab;  Nasopharyngeal(NP) swabs in vial transport medium  Result Value Ref Range Status   SARS Coronavirus 2 by RT PCR NEGATIVE NEGATIVE Final    Comment: (NOTE) SARS-CoV-2 target nucleic acids are NOT DETECTED.  The SARS-CoV-2 RNA is generally detectable in upper respiratory specimens during the acute phase of infection. The lowest concentration of SARS-CoV-2 viral copies this assay can detect is 138 copies/mL. A negative result does not preclude SARS-Cov-2 infection and should not be used as the sole basis for  treatment or other patient management decisions. A negative result may occur with  improper specimen collection/handling, submission of specimen other than nasopharyngeal swab, presence of viral mutation(s) within the areas targeted by this assay, and inadequate number of viral copies(<138 copies/mL). A negative result must be combined with clinical observations, patient history, and epidemiological information. The expected result is Negative.  Fact Sheet for Patients:  EntrepreneurPulse.com.au  Fact Sheet for Healthcare Providers:  IncredibleEmployment.be  This test is no t yet approved or cleared by the [place] FDA and  has been authorized for detection and/or diagnosis of SARS-CoV-2 by FDA under an Emergency Use Authorization (EUA). This EUA will remain  in effect (meaning this test can be used) for the duration of the COVID-19 declaration under Section 564(b)(1) of the Act, 21 U.S.C.section 360bbb-3(b)(1), unless the authorization is terminated  or revoked sooner.       Influenza A by PCR NEGATIVE NEGATIVE Final   Influenza B by PCR NEGATIVE NEGATIVE Final    Comment: (NOTE) The Xpert Xpress SARS-CoV-2/FLU/RSV plus assay is intended as an aid in the diagnosis of influenza from Nasopharyngeal swab specimens and should not be used as a sole basis for treatment. Nasal washings and aspirates are unacceptable for Xpert Xpress  SARS-CoV-2/FLU/RSV testing.  Fact Sheet for Patients: EntrepreneurPulse.com.au  Fact Sheet for Healthcare Providers: IncredibleEmployment.be  This test is not yet approved or cleared by the [place] FDA and has been authorized for detection and/or diagnosis of SARS-CoV-2 by FDA under an Emergency Use Authorization (EUA). This EUA will remain in effect (meaning this test can be used) for the duration of the COVID-19 declaration under Section 564(b)(1) of the Act, 21 U.S.C. section 360bbb-3(b)(1), unless the authorization is terminated or revoked.  Performed at [facility], [place] [address], [place], [place] [address]   MRSA Next Gen by PCR, Nasal     Status: None   Collection Time: 09/18/21  3:35 AM   Specimen: Nasal Mucosa; Nasal Swab  Result Value Ref Range Status   MRSA by PCR Next Gen NOT DETECTED NOT DETECTED Final    Comment: (NOTE) The GeneXpert MRSA Assay (FDA approved for NASAL specimens only), is one component of a comprehensive MRSA colonization surveillance program. It is not intended to diagnose MRSA infection nor to guide or monitor treatment for MRSA infections. Test performance is not FDA approved in patients less than 15 years old. Performed at [facility], [place] [address], [place], [place] [address]   Culture, blood (routine x 2)     Status: None (Preliminary result)   Collection Time: 09/18/21  6:06 AM   Specimen: BLOOD RIGHT FOREARM  Result Value Ref Range Status   Specimen Description BLOOD RIGHT FOREARM  Final   Special Requests   Final    BOTTLES DRAWN AEROBIC AND ANAEROBIC Blood Culture adequate volume   Culture   Final    NO GROWTH 3 DAYS Performed at [facility], [place] [address], [place], [place] [address]    Report Status PENDING  Incomplete  Culture, blood (routine x 2)     Status: None (Preliminary result)   Collection Time: 09/18/21  6:11 AM   Specimen: BLOOD RIGHT HAND  Result Value  Ref Range Status   Specimen Description BLOOD RIGHT HAND  Final   Special Requests   Final    BOTTLES DRAWN AEROBIC AND ANAEROBIC Blood Culture adequate volume   Culture   Final    NO GROWTH 3 DAYS  Performed at [facility], [place] [address] [place], [place] [address]    Report Status PENDING  Incomplete    Radiology Reports DG Chest 1 View  Result Date: 09/17/2021 CLINICAL DATA:  Chest pressure and shortness of breath. EXAM: CHEST  1 VIEW COMPARISON:  Chest radiograph 08/26/2021, CT chest 12/19/2015 FINDINGS: Status post median sternotomy. Cardiac silhouette is again at the upper limits of normal size. Mild to moderate calcifications within the aortic arch. There are again calcified nodular densities within left lung apex, unchanged. These are consistent with the calcified pleural plaques seen on prior CT. No focal airspace opacity to indicate pneumonia. No pleural effusion or pneumothorax. No acute skeletal abnormality. IMPRESSION: No acute lung process.  Unchanged chronic findings as above. Electronically Signed   By: Yvonne Kendall   On: 09/17/2021 13:04   DG Chest 2 View  Result Date: 08/26/2021 CLINICAL DATA:  COVID infection. EXAM: CHEST - 2 VIEW COMPARISON:  December 11, 2020 FINDINGS: No pneumothorax. Calcified nodules in the lateral left apex, unchanged. The cardiomediastinal silhouette is stable. Intact sternotomy wires. No suspicious nodules or masses. No focal infiltrates. Tiny left effusion versus pleural thickening, unchanged. IMPRESSION: No acute abnormalities or changes. Electronically Signed   By: Dorise Bullion III M.D.   On: 08/26/2021 13:57   NM Pulmonary Perfusion  Result Date: 09/17/2021 CLINICAL DATA:  Pulmonary embolism (PE) suspected, high prob. Shortness of breath, chest pain EXAM: NUCLEAR MEDICINE PERFUSION LUNG SCAN TECHNIQUE: Perfusion images were obtained in multiple projections after intravenous injection of radiopharmaceutical. Ventilation scans intentionally  deferred if perfusion scan and chest x-ray adequate for interpretation during COVID 19 epidemic. RADIOPHARMACEUTICALS:  4.3 mCi Tc-52m MAA IV COMPARISON:  Chest x-ray today FINDINGS: No perfusion defects to suggest pulmonary embolus. IMPRESSION: No evidence of pulmonary embolus. Electronically Signed   By: Rolm Baptise M.D.   On: 09/17/2021 20:06   CARDIAC CATHETERIZATION  Result Date: 09/19/2021 CONCLUSIONS: Severe diffuse calcified coronary disease of the native arteries with 100% proximal RCA, 90% ostial circumflex, 99% distal left main, severely and diffusely diseased proximal to mid LAD with threatened septal perforator. Diffusely diseased saphenous vein graft to the right coronary.  Ostial to proximal 70% stenosis. Widely patent sequential graft to first and second obtuse marginal Widely patent saphenous vein graft to the first diagonal Widely patent LIMA to the mid LAD Aortic valve is heavily calcified, was difficult to cross, but without significant gradient.  LVEDP 10 mmHg. Total contrast 70 cc RECOMMENDATIONS: Continue IV fluids for 18 to 24 hours.  He was given an additional 250 cc of saline as a bolus after identifying that LVEDP was 9 mmHg. Monitor kidney function closely.  Contrast exposure was 70 cc. The saphenous vein graft to the right coronary can be stented although it is not so critical that it should be causing enzyme elevation.  Enzyme elevation likely related to left coronary native disease in the septal perforator territory.   DG CHEST PORT 1 VIEW  Result Date: 09/20/2021 CLINICAL DATA:  Chest pain, shortness of breath, fatigue EXAM: PORTABLE CHEST 1 VIEW COMPARISON:  Chest radiograph 09/17/2021 FINDINGS: Median sternotomy wires are again noted. The cardiomediastinal silhouette is stable. There is no focal consolidation or pulmonary edema. There is no pleural effusion or pneumothorax. Calcified pleural plaques are again noted in the left apex. There is no acute osseous abnormality.  IMPRESSION: Stable chest with no radiographic evidence of acute cardiopulmonary process. Electronically Signed   By: Court Joy.D.  On: 09/20/2021 10:08   ECHOCARDIOGRAM COMPLETE  Result Date: 09/18/2021    ECHOCARDIOGRAM REPORT   Patient Name:   JAYVIN HURRELL [facility] Date of Exam: 09/18/2021 Medical Rec #:  [id number]        Height:       70.0 in Accession #:    [id number]       Weight:       225.3 lb Date of Birth:  1945/10/05        BSA:          2.196 m Patient Age:    63 years         BP:           165/90 mmHg Patient Gender: M                HR:           97 bpm. Exam Location:  Inpatient Procedure: 2D Echo, Cardiac Doppler, Color Doppler and Intracardiac            Opacification Agent Indications:    Acute myocardial infarction  History:        Patient has prior history of Echocardiogram examinations, most                 recent 05/22/2009. Prior CABG; Risk Factors:Diabetes and                 Hypertension. CKD. GERD.  Sonographer:    [name] Lefort RDCS (AE) Referring Phys: [phone] JARED M GARDNER  Sonographer Comments: Technically difficult study due to poor echo windows and no subcostal window. IMPRESSIONS  1. Left ventricular ejection fraction, by estimation, is 60 to 65%. The left ventricle has normal function. The left ventricle has no regional wall motion abnormalities. Left ventricular diastolic parameters are consistent with Grade I diastolic dysfunction (impaired relaxation). Elevated left ventricular end-diastolic pressure.  2. Right ventricular systolic function is moderately reduced. The right ventricular size is normal.  3. The mitral valve is normal in structure. No evidence of mitral valve regurgitation. No evidence of mitral stenosis.  4. The AV is poorly visualized to comment on structure. There does appear to be come calcification of the valve. . The aortic valve was not well visualized. Aortic valve regurgitation is not visualized. No aortic stenosis is present. FINDINGS  Left Ventricle: Left  ventricular ejection fraction, by estimation, is 60 to 65%. The left ventricle has normal function. The left ventricle has no regional wall motion abnormalities. Definity contrast agent was given IV to delineate the left ventricular  endocardial borders. The left ventricular internal cavity size was normal in size. There is no left ventricular hypertrophy. Left ventricular diastolic parameters are consistent with Grade I diastolic dysfunction (impaired relaxation). Elevated left ventricular end-diastolic pressure. Right Ventricle: The right ventricular size is normal. No increase in right ventricular wall thickness. Right ventricular systolic function is moderately reduced. Left Atrium: Left atrial size was normal in size. Right Atrium: Right atrial size was normal in size. Pericardium: There is no evidence of pericardial effusion. Mitral Valve: The mitral valve is normal in structure. No evidence of mitral valve regurgitation. No evidence of mitral valve stenosis. Tricuspid Valve: The tricuspid valve is normal in structure. Tricuspid valve regurgitation is mild . No evidence of tricuspid stenosis. Aortic Valve: The AV is poorly visualized to comment on structure. There does appear to be come calcification of the valve. The aortic valve was not well visualized. Aortic valve regurgitation is not visualized. No aortic  stenosis is present. Aortic valve mean gradient measures 3.0 mmHg. Aortic valve peak gradient measures 4.8 mmHg. Aortic valve area, by VTI measures 2.47 cm. Pulmonic Valve: The pulmonic valve was normal in structure. Pulmonic valve regurgitation is not visualized. No evidence of pulmonic stenosis. Aorta: The aortic root is normal in size and structure. Venous: The inferior vena cava was not well visualized. IAS/Shunts: No atrial level shunt detected by color flow Doppler.  LEFT VENTRICLE PLAX 2D LVOT diam:     1.90 cm   Diastology LV SV:         52        LV e' medial:    5.66 cm/s LV SV Index:   24         LV E/e' medial:  14.8 LVOT Area:     2.84 cm  LV e' lateral:   6.09 cm/s                          LV E/e' lateral: 13.7  RIGHT VENTRICLE RV S prime:     10.30 cm/s TAPSE (M-mode): 1.3 cm LEFT ATRIUM             Index        RIGHT ATRIUM           Index LA Vol (A2C):   37.4 ml 17.03 ml/m  RA Area:     14.40 cm LA Vol (A4C):   69.8 ml 31.79 ml/m  RA Volume:   31.30 ml  14.25 ml/m LA Biplane Vol: 54.3 ml 24.73 ml/m  AORTIC VALVE AV Area (Vmax):    2.51 cm AV Area (Vmean):   2.75 cm AV Area (VTI):     2.47 cm AV Vmax:           110.00 cm/s AV Vmean:          77.800 cm/s AV VTI:            0.210 m AV Peak Grad:      4.8 mmHg AV Mean Grad:      3.0 mmHg LVOT Vmax:         97.50 cm/s LVOT Vmean:        75.400 cm/s LVOT VTI:          0.183 m LVOT/AV VTI ratio: 0.87  AORTA Ao Root diam: 2.70 cm Ao Asc diam:  2.90 cm MITRAL VALVE                TRICUSPID VALVE MV Area (PHT): 2.05 cm     TR Peak grad:   18.5 mmHg MV Decel Time: 370 msec     TR Vmax:        215.00 cm/s MV E velocity: 83.70 cm/s MV A velocity: 163.00 cm/s  SHUNTS MV E/A ratio:  0.51         Systemic VTI:  0.18 m                             Systemic Diam: 1.90 cm Fransico Him MD Electronically signed by Fransico Him MD Signature Date/Time: 09/18/2021/11:30:29 AM    Final    VAS [place] LOWER EXTREMITY VENOUS (DVT) (ONLY [facility] & [facility])  Result Date: 09/17/2021  Lower Venous DVT Study Patient Name:  KASEAN DENHERDER [facility]  Date of Exam:   09/17/2021 Medical Rec #: [id number]         Accession #:  [id number] Date of Birth: May 08, 1946         Patient Gender: M Patient Age:   67 years Exam Location:  [facility] Procedure:      VAS [place] LOWER EXTREMITY VENOUS (DVT) Referring Phys: BRITNI HENDERLY --------------------------------------------------------------------------------  Indications: Edema.  Comparison Study: No prior study Performing Technologist: Maudry Mayhew MHA, RDMS, RVT, RDCS  Examination Guidelines: A complete evaluation includes B-mode imaging,  spectral Doppler, color Doppler, and power Doppler as needed of all accessible portions of each vessel. Bilateral testing is considered an integral part of a complete examination. Limited examinations for reoccurring indications may be performed as noted. The reflux portion of the exam is performed with the patient in reverse Trendelenburg.  +---------+---------------+---------+-----------+----------+--------------+  RIGHT     Compressibility Phasicity Spontaneity Properties Thrombus Aging  +---------+---------------+---------+-----------+----------+--------------+  CFV       Full            Yes       Yes                                    +---------+---------------+---------+-----------+----------+--------------+  SFJ       Full                                                             +---------+---------------+---------+-----------+----------+--------------+  FV Prox   Full                                                             +---------+---------------+---------+-----------+----------+--------------+  FV Mid    Full                                                             +---------+---------------+---------+-----------+----------+--------------+  FV Distal Full                                                             +---------+---------------+---------+-----------+----------+--------------+  PFV       Full                                                             +---------+---------------+---------+-----------+----------+--------------+  POP       Full            Yes       Yes                                    +---------+---------------+---------+-----------+----------+--------------+  PTV       Full                                                             +---------+---------------+---------+-----------+----------+--------------+  PERO      Full                                                             +---------+---------------+---------+-----------+----------+--------------+    +---------+---------------+---------+-----------+----------+--------------+  LEFT      Compressibility Phasicity Spontaneity Properties Thrombus Aging  +---------+---------------+---------+-----------+----------+--------------+  CFV       Full            Yes       Yes                                    +---------+---------------+---------+-----------+----------+--------------+  SFJ       Full                                                             +---------+---------------+---------+-----------+----------+--------------+  FV Prox   Full                                                             +---------+---------------+---------+-----------+----------+--------------+  FV Mid    Full                                                             +---------+---------------+---------+-----------+----------+--------------+  FV Distal Full                                                             +---------+---------------+---------+-----------+----------+--------------+  PFV       Full                                                             +---------+---------------+---------+-----------+----------+--------------+  POP       Full            Yes       Yes                                    +---------+---------------+---------+-----------+----------+--------------+  PTV       Full                                                             +---------+---------------+---------+-----------+----------+--------------+  PERO      Full                                                             +---------+---------------+---------+-----------+----------+--------------+     Summary: RIGHT: - There is no evidence of deep vein thrombosis in the lower extremity.  - No cystic structure found in the popliteal fossa.  LEFT: - There is no evidence of deep vein thrombosis in the lower extremity.  - No cystic structure found in the popliteal fossa.  *See table(s) above for measurements and observations. Electronically signed  by Harold Barban MD on 09/17/2021 at 9:42:23 PM.    Final     Lab Data:  CBC: Recent Labs  Lab 09/17/21 1247 09/17/21 1856 09/18/21 0606 09/19/21 0147 09/20/21 0100  WBC 13.7*  --  11.4* 11.0* 9.9  HGB 14.3 11.9* 13.2 12.2* 11.4*  HCT 44.0 35.0* 38.4* 38.1* 35.1*  MCV 100.5*  --  96.5 99.7 97.2  PLT 172  --  135* 127* 027*   Basic Metabolic Panel: Recent Labs  Lab 09/18/21 0606 09/19/21 0147 09/20/21 0100 09/21/21 0106 09/22/21 0126  NA 136 137 136 136 139  K 4.6 4.4 4.2 5.9* 4.2  CL 108 108 112* 106 110  CO2 18* 16* 18* 18* 20*  GLUCOSE 165* 204* 140* 198* 133*  BUN 50* 41* 36* 34* 33*  CREATININE 1.95* 2.15* 1.86* 2.01* 1.92*  CALCIUM 8.9 8.6* 8.1* 8.8* 9.0   GFR: Estimated Creatinine Clearance: 38.6 mL/min (A) (by C-G formula based on SCr of 1.92 mg/dL (H)). Liver Function Tests: Recent Labs  Lab 09/17/21 2323  AST 25  ALT 36  ALKPHOS 33*  BILITOT 0.8  PROT 5.3*  ALBUMIN 3.0*   No results for input(s): LIPASE, AMYLASE in the last 168 hours. No results for input(s): AMMONIA in the last 168 hours. Coagulation Profile: No results for input(s): INR, PROTIME in the last 168 hours. Cardiac Enzymes: No results for input(s): CKTOTAL, CKMB, CKMBINDEX, TROPONINI in the last 168 hours. BNP (last 3 results) No results for input(s): PROBNP in the last 8760 hours. HbA1C: No results for input(s): HGBA1C in the last 72 hours. CBG: Recent Labs  Lab 09/21/21 0608 09/21/21 1125 09/21/21 1623 09/21/21 2110 09/22/21 0604  GLUCAP 166* 244* 252* 183* 113*   Lipid Profile: No results for input(s): CHOL, HDL, LDLCALC, TRIG, CHOLHDL, LDLDIRECT in the last 72 hours. Thyroid Function Tests: No results for input(s): TSH, T4TOTAL, FREET4, T3FREE, THYROIDAB in the last 72 hours. Anemia Panel: No results for input(s): VITAMINB12, FOLATE, FERRITIN, TIBC, IRON, RETICCTPCT in the last 72 hours. Urine analysis:    Component Value Date/Time   COLORURINE YELLOW 09/18/2021  0259   APPEARANCEUR CLEAR 09/18/2021 0259   LABSPEC 1.020 09/18/2021 0259   PHURINE 5.0 09/18/2021 0259   GLUCOSEU >=500 (A) 09/18/2021 0259   GLUCOSEU NEGATIVE 11/25/2010 0951   HGBUR  NEGATIVE 09/18/2021 0259   BILIRUBINUR NEGATIVE 09/18/2021 0259   BILIRUBINUR neg. 08/30/2012 [place] 09/18/2021 0259   PROTEINUR NEGATIVE 09/18/2021 0259   UROBILINOGEN 0.2 08/30/2012 1548   UROBILINOGEN 0.2 11/25/2010 0951   NITRITE NEGATIVE 09/18/2021 0259   LEUKOCYTESUR NEGATIVE 09/18/2021 0259     Valma Rotenberg M.D. [facility] 09/22/2021, 11:12 AM  Available via Epic secure chat 7am-7pm After 7 pm, please refer to night coverage provider listed on amion.

## 2021-09-22 NOTE — Plan of Care (Signed)
°  Problem: Education: Goal: Understanding of cardiac disease, CV risk reduction, and recovery process will improve Outcome: Progressing Goal: Individualized Educational Video(s) Outcome: Progressing   Problem: Activity: Goal: Ability to tolerate increased activity will improve Outcome: Progressing   Problem: Cardiac: Goal: Ability to achieve and maintain adequate cardiovascular perfusion will improve Outcome: Progressing   Problem: Health Behavior/Discharge Planning: Goal: Ability to safely manage health-related needs after discharge will improve Outcome: Progressing   Problem: Education: Goal: Knowledge of General Education information will improve Description: Including pain rating scale, medication(s)/side effects and non-pharmacologic comfort measures Outcome: Progressing   Problem: Health Behavior/Discharge Planning: Goal: Ability to manage health-related needs will improve Outcome: Progressing   Problem: Clinical Measurements: Goal: Ability to maintain clinical measurements within normal limits will improve Outcome: Progressing Goal: Will remain free from infection Outcome: Progressing Goal: Diagnostic test results will improve Outcome: Progressing Goal: Respiratory complications will improve Outcome: Progressing Goal: Cardiovascular complication will be avoided Outcome: Progressing   Problem: Activity: Goal: Risk for activity intolerance will decrease Outcome: Progressing   Problem: Nutrition: Goal: Adequate nutrition will be maintained Outcome: Progressing   Problem: Coping: Goal: Level of anxiety will decrease Outcome: Progressing   Problem: Pain Managment: Goal: General experience of comfort will improve Outcome: Progressing   Problem: Safety: Goal: Ability to remain free from injury will improve Outcome: Progressing   Problem: Skin Integrity: Goal: Risk for impaired skin integrity will decrease Outcome: Progressing

## 2021-09-23 ENCOUNTER — Ambulatory Visit: Payer: PPO | Admitting: Cardiovascular Disease

## 2021-09-23 LAB — GLUCOSE, CAPILLARY
Glucose-Capillary: 177 mg/dL — ABNORMAL HIGH (ref 70–99)
Glucose-Capillary: 288 mg/dL — ABNORMAL HIGH (ref 70–99)
Glucose-Capillary: 208 mg/dL — ABNORMAL HIGH (ref 70–99)
Glucose-Capillary: 253 mg/dL — ABNORMAL HIGH (ref 70–99)

## 2021-09-23 LAB — CULTURE, BLOOD (ROUTINE X 2)
Culture: NO GROWTH
Culture: NO GROWTH
Special Requests: ADEQUATE
Special Requests: ADEQUATE

## 2021-09-23 MED ORDER — POLYETHYLENE GLYCOL 3350 17 G PO PACK
17.0000 g | PACK | Freq: Every day | ORAL | Status: DC
  Administered 2021-09-23 – 2021-09-25 (×2): 17 g via ORAL
  Filled 2021-09-23 (×3): qty 1

## 2021-09-23 MED ORDER — INSULIN ASPART PROT & ASPART (70-30 MIX) 100 UNIT/ML ~~LOC~~ SUSP
20.0000 [IU] | Freq: Every day | SUBCUTANEOUS | Status: DC
  Administered 2021-09-24 – 2021-09-25 (×2): 20 [IU] via SUBCUTANEOUS
  Filled 2021-09-23: qty 10

## 2021-09-23 NOTE — Progress Notes (Signed)
Physical Therapy Treatment Patient Details Name: Kurt Aguilar MRN: [id number] DOB: 1946-08-06 Today's Date: 09/23/2021   History of Present Illness Pt is a 76 y/o male admitted secondary to worsening SOB and chest pain. Pt with recent diagnosis of BOOP and Covid. Pt is s/p heart cath on 1/12. PMH includes HTN, DM, PE, and prostate cancer.    PT Comments    Pt agreeable to activity OOB. He acknowledges being very debilitated stemming from over a month of decline.  Emphasis on transition, sit to standing, standing exercise and progression of gait stability and speed with the RW .    Recommendations for follow up therapy are one component of a multi-disciplinary discharge planning process, led by the attending physician.  Recommendations may be updated based on patient status, additional functional criteria and insurance authorization.  Follow Up Recommendations  Skilled nursing-short term rehab (<3 hours/day)     Assistance Recommended at Discharge Frequent or constant Supervision/Assistance  Patient can return home with the following A little help with walking and/or transfers;A little help with bathing/dressing/bathroom;Assistance with cooking/housework   Equipment Recommendations  Other (comment) (TBA)    Recommendations for Other Services       Precautions / Restrictions Precautions Precautions: Fall     Mobility  Bed Mobility Overal bed mobility: Needs Assistance Bed Mobility: Supine to Sit     Supine to sit: Min guard     General bed mobility comments: up via L elbow, slow with a little struggle.    Transfers Overall transfer level: Needs assistance Equipment used: None Transfers: Sit to/from Stand Sit to Stand: Min guard           General transfer comment: Min gaurd for sit to stand from various surfaces.    Ambulation/Gait Ambulation/Gait assistance: Min assist Gait Distance (Feet): 100 Feet Assistive device: Rolling walker (2 wheels) Gait  Pattern/deviations: Step-through pattern;Decreased stride length   Gait velocity interpretation: <1.8 ft/sec, indicate of risk for recurrent falls   General Gait Details: guarded and mildly unsteady.  Occasional list and wobble L, but no LOB.  Pt unable to significantly increase speed to cue.  HR in the 80's and low 90's, SpO2 at 95-98%   Stairs             Wheelchair Mobility    Modified Rankin (Stroke Patients Only)       Balance Overall balance assessment: Needs assistance Sitting-balance support: No upper extremity supported;Feet supported Sitting balance-Leahy Scale: Fair     Standing balance support: Bilateral upper extremity supported Standing balance-Leahy Scale: Fair                              Cognition Arousal/Alertness: Awake/alert Behavior During Therapy: WFL for tasks assessed/performed Overall Cognitive Status: Within Functional Limits for tasks assessed                                          Exercises General Exercises - Lower Extremity Hip Flexion/Marching: AROM;Strengthening;Both;10 reps;Standing    General Comments General comments (skin integrity, edema, etc.): wife present at bedside      Pertinent Vitals/Pain      Home Living                          Prior Function  PT Goals (current goals can now be found in the care plan section) Acute Rehab PT Goals PT Goal Formulation: With patient/family Time For Goal Achievement: 10/04/21 Potential to Achieve Goals: Good Progress towards PT goals: Progressing toward goals    Frequency    Min 3X/week      PT Plan Current plan remains appropriate    Co-evaluation              AM-PAC PT "6 Clicks" Mobility   Outcome Measure  Help needed turning from your back to your side while in a flat bed without using bedrails?: A Little Help needed moving from lying on your back to sitting on the side of a flat bed without using  bedrails?: A Little Help needed moving to and from a bed to a chair (including a wheelchair)?: A Little Help needed standing up from a chair using your arms (e.g., wheelchair or bedside chair)?: A Little Help needed to walk in hospital room?: A Little Help needed climbing 3-5 steps with a railing? : A Lot 6 Click Score: 17    End of Session   Activity Tolerance: Patient tolerated treatment well Patient left: in bed;with call bell/phone within reach;with family/visitor present Nurse Communication: Mobility status PT Visit Diagnosis: Unsteadiness on feet (R26.81);Muscle weakness (generalized) (M62.81)     Time: 5520-8022 PT Time Calculation (min) (ACUTE ONLY): 19 min  Charges:  $Gait Training: 8-22 mins                     09/23/2021  Ginger Carne., PT Acute Rehabilitation Services [phone]  (pager) [phone]  (office)   Tessie Fass Thi Klich 09/23/2021, 4:56 PM

## 2021-09-23 NOTE — Progress Notes (Signed)
[facility]                                                                              Patient Demographics  Kurt Aguilar, is a 76 y.o. male, DOB - [birth year], HMC:[id number]  Admit date - 09/17/2021   Admitting Physician Etta Quill, DO  Outpatient Primary MD for the patient is Tower, [name], MD  Outpatient specialists:   LOS - 6  days   Medical records reviewed and are as summarized below:    Chief Complaint  Patient presents with   Chest Pain   Shortness of Breath   Fatigue       Brief summary   Patient is a 76 year old male with history of CAD status post CABG in 1997, DM2, HTN, HLD, Boop, recent COVID-19 infection in December 2022, chronic steroid dependency presented to ED with mild intermittent chest pain for a month, worse with exertion, dyspnea.  Patient was found to have elevated troponins and was admitted for further work-up   Assessment & Plan    Principal Problem:   NSTEMI [facility]) with known history of CAD status post CABG x5 in 1997 -2D echo showed no wall motion abnormality, normal EF, likely demand ischemia -Left heart cardiac cath showed multivessel native coronary artery diseasesaphenous vein to right coronary artery with 70% ostial to proximal stenosis, SVG to OM 1 and 2 patent, SVG to first diagonal patent, LIMA to LAD patent.  -Continue aspirin, beta-blocker, Crestor, Zetia, -Stable from cardiac standpoint, awaiting skilled nursing facility   Active Problems:   Essential hypertension -BP stable, continue beta-blocker   Hyperkalemia -Resolved  Acute on chronic CKD stage IIIb, lactic acidosis -Creatinine 2.34 on admission, baseline around 1.8-1.9 -Creatinine improved, close to baseline.  Completed IV fluid hydration.    Shortness of breath with cough -Per patient 4 weeks, dry persistent cough, possibly due to COVID-19 in December (positive on 12/4), underlying history of BOOP -CXR with no pneumonia or effusion.  VQ  scan negative for PE.  Normal BNP -Continues to feel winded with exertion, coughing spasms. -Continue prednisone 40 mg daily with a taper upon discharge. (Outpatient on 10 mg BID) -Continue scheduled nebs, incentive spirometry, Tessalon Perles  -Home O2 evaluation done on 1/15, did not meet criteria for home O2  Hyperlipidemia -LDL 74 in 04/2021.  Continue Crestor 40 mg daily, Zetia 10 mg daily  Diabetes mellitus type 2, on long-term insulin with renal complications -Hemoglobin A1c 9.5 on 09/13/2021 -CBGs elevated due to prednisone, appreciate diabetic coordinator consult, recommended DC NPH  -Started 70/30 insulin 20 units with breakfast,  -Continue sliding scale insulin  Recent Labs    09/22/21 0604 09/22/21 1121 09/22/21 1531 09/22/21 2113 09/23/21 0603 09/23/21 1127  GLUCAP 113* 205* 381* 262* 177* 288*       BOOP - follows Dr [name] outpatient, continue prednisone   Obesity Estimated body mass index is 32.39 kg/m as calculated from the following:   Height as of this encounter: 5\' 10"  (1.778 m).   Weight as of this encounter: 102.4 kg.  Code Status: Full code DVT Prophylaxis:  heparin injection 5,000 Units Start: 09/19/21 2200 SCD's  Start: 09/19/21 1110   Level of Care: Level of care: Progressive Family Communication: Updated patient's wife at the bedside on 1/15  Disposition Plan:     Status is: Inpatient  Remains inpatient appropriate because awaiting skilled nursing facility.  Patient is medically stable.   Time Spent in minutes 25 minutes  Procedures:  2D echo Cardiac cath  Consultants:   Cardiology CIR  Antimicrobials:   Anti-infectives (From admission, onward)    None          Medications  Scheduled Meds:  aspirin  81 mg Oral Daily   benzonatate  200 mg Oral TID   ezetimibe  10 mg Oral Daily   famotidine  20 mg Oral QHS   heparin  5,000 Units Subcutaneous Q8H   insulin aspart  0-15 Units Subcutaneous TID WC   insulin aspart  0-5  Units Subcutaneous QHS   [START ON 09/24/2021] insulin aspart protamine- aspart  20 Units Subcutaneous Q breakfast   metoprolol tartrate  50 mg Oral BID   pantoprazole  80 mg Oral BID AC   polyethylene glycol  17 g Oral Daily   predniSONE  40 mg Oral Q breakfast   rosuvastatin  40 mg Oral Daily   sodium chloride flush  3 mL Intravenous Q12H   sodium chloride flush  3 mL Intravenous Q12H   sodium chloride flush  3 mL Intravenous Q12H   Continuous Infusions:  sodium chloride     PRN Meds:.sodium chloride, acetaminophen, acetaminophen, guaiFENesin-codeine, hydrALAZINE, ipratropium-albuterol, nitroGLYCERIN, ondansetron (ZOFRAN) IV, ondansetron (ZOFRAN) IV, sodium chloride flush      Subjective:   Kurt Aguilar was seen and examined today.  Complaining of constipation otherwise no other issues.  CBGs has been running elevated.  Cough still present but improving.    Objective:   Vitals:   09/23/21 0420 09/23/21 0745 09/23/21 0855 09/23/21 1129  BP: 130/78 117/71 131/72 132/75  Pulse: 66 87 91 65  Resp: 19 (!) 25  20  Temp: 97.9 F (36.6 C) 97.9 F (36.6 C)  97.6 F (36.4 C)  TempSrc: Oral     SpO2: 94% 95%  94%  Weight:      Height:        Intake/Output Summary (Last 24 hours) at 09/23/2021 1238 Last data filed at 09/23/2021 3903 Gross per 24 hour  Intake 220 ml  Output 1400 ml  Net -1180 ml     Wt Readings from Last 3 Encounters:  09/22/21 102.4 kg  09/13/21 103.4 kg  08/26/21 106.8 kg   Physical Exam General: Alert and oriented x 3, NAD Cardiovascular: S1 S2 clear, RRR. No pedal edema b/l Respiratory: CTAB, no wheezing, rales or rhonchi Gastrointestinal: Soft, nontender, nondistended, NBS Ext: no pedal edema bilaterally Psych: Normal affect and demeanor, alert and oriented x3    Data Reviewed:  I have personally reviewed following labs and imaging studies  Micro Results Recent Results (from the past 240 hour(s))  Resp Panel by RT-PCR (Flu A&B, Covid)  Nasopharyngeal Swab     Status: None   Collection Time: 09/17/21  5:09 PM   Specimen: Nasopharyngeal Swab; Nasopharyngeal(NP) swabs in vial transport medium  Result Value Ref Range Status   SARS Coronavirus 2 by RT PCR NEGATIVE NEGATIVE Final    Comment: (NOTE) SARS-CoV-2 target nucleic acids are NOT DETECTED.  The SARS-CoV-2 RNA is generally detectable in upper respiratory specimens during the acute phase of infection. The lowest concentration of SARS-CoV-2 viral copies this assay can detect is 138  copies/mL. A negative result does not preclude SARS-Cov-2 infection and should not be used as the sole basis for treatment or other patient management decisions. A negative result may occur with  improper specimen collection/handling, submission of specimen other than nasopharyngeal swab, presence of viral mutation(s) within the areas targeted by this assay, and inadequate number of viral copies(<138 copies/mL). A negative result must be combined with clinical observations, patient history, and epidemiological information. The expected result is Negative.  Fact Sheet for Patients:  EntrepreneurPulse.com.au  Fact Sheet for Healthcare Providers:  IncredibleEmployment.be  This test is no t yet approved or cleared by the [place] FDA and  has been authorized for detection and/or diagnosis of SARS-CoV-2 by FDA under an Emergency Use Authorization (EUA). This EUA will remain  in effect (meaning this test can be used) for the duration of the COVID-19 declaration under Section 564(b)(1) of the Act, 21 U.S.C.section 360bbb-3(b)(1), unless the authorization is terminated  or revoked sooner.       Influenza A by PCR NEGATIVE NEGATIVE Final   Influenza B by PCR NEGATIVE NEGATIVE Final    Comment: (NOTE) The Xpert Xpress SARS-CoV-2/FLU/RSV plus assay is intended as an aid in the diagnosis of influenza from Nasopharyngeal swab specimens and should not be  used as a sole basis for treatment. Nasal washings and aspirates are unacceptable for Xpert Xpress SARS-CoV-2/FLU/RSV testing.  Fact Sheet for Patients: EntrepreneurPulse.com.au  Fact Sheet for Healthcare Providers: IncredibleEmployment.be  This test is not yet approved or cleared by the [place] FDA and has been authorized for detection and/or diagnosis of SARS-CoV-2 by FDA under an Emergency Use Authorization (EUA). This EUA will remain in effect (meaning this test can be used) for the duration of the COVID-19 declaration under Section 564(b)(1) of the Act, 21 U.S.C. section 360bbb-3(b)(1), unless the authorization is terminated or revoked.  Performed at [facility], [place] [address] [place], [place] [address]   MRSA Next Gen by PCR, Nasal     Status: None   Collection Time: 09/18/21  3:35 AM   Specimen: Nasal Mucosa; Nasal Swab  Result Value Ref Range Status   MRSA by PCR Next Gen NOT DETECTED NOT DETECTED Final    Comment: (NOTE) The GeneXpert MRSA Assay (FDA approved for NASAL specimens only), is one component of a comprehensive MRSA colonization surveillance program. It is not intended to diagnose MRSA infection nor to guide or monitor treatment for MRSA infections. Test performance is not FDA approved in patients less than 48 years old. Performed at [facility], [place] [address] [place], [place] [address]   Culture, blood (routine x 2)     Status: None   Collection Time: 09/18/21  6:06 AM   Specimen: BLOOD RIGHT FOREARM  Result Value Ref Range Status   Specimen Description BLOOD RIGHT FOREARM  Final   Special Requests   Final    BOTTLES DRAWN AEROBIC AND ANAEROBIC Blood Culture adequate volume   Culture   Final    NO GROWTH 5 DAYS Performed at [facility], [place] [address], [place], [place] [address]    Report Status 09/23/2021 FINAL  Final  Culture, blood (routine x 2)     Status: None   Collection Time:  09/18/21  6:11 AM   Specimen: BLOOD RIGHT HAND  Result Value Ref Range Status   Specimen Description BLOOD RIGHT HAND  Final   Special Requests   Final    BOTTLES DRAWN AEROBIC AND ANAEROBIC Blood  Culture adequate volume   Culture   Final    NO GROWTH 5 DAYS Performed at [facility], [place] [address] [place], [place] [address]    Report Status 09/23/2021 FINAL  Final    Radiology Reports DG Chest 1 View  Result Date: 09/17/2021 CLINICAL DATA:  Chest pressure and shortness of breath. EXAM: CHEST  1 VIEW COMPARISON:  Chest radiograph 08/26/2021, CT chest 12/19/2015 FINDINGS: Status post median sternotomy. Cardiac silhouette is again at the upper limits of normal size. Mild to moderate calcifications within the aortic arch. There are again calcified nodular densities within left lung apex, unchanged. These are consistent with the calcified pleural plaques seen on prior CT. No focal airspace opacity to indicate pneumonia. No pleural effusion or pneumothorax. No acute skeletal abnormality. IMPRESSION: No acute lung process.  Unchanged chronic findings as above. Electronically Signed   By: Yvonne Kendall   On: 09/17/2021 13:04   DG Chest 2 View  Result Date: 08/26/2021 CLINICAL DATA:  COVID infection. EXAM: CHEST - 2 VIEW COMPARISON:  December 11, 2020 FINDINGS: No pneumothorax. Calcified nodules in the lateral left apex, unchanged. The cardiomediastinal silhouette is stable. Intact sternotomy wires. No suspicious nodules or masses. No focal infiltrates. Tiny left effusion versus pleural thickening, unchanged. IMPRESSION: No acute abnormalities or changes. Electronically Signed   By: Dorise Bullion III M.D.   On: 08/26/2021 13:57   NM Pulmonary Perfusion  Result Date: 09/17/2021 CLINICAL DATA:  Pulmonary embolism (PE) suspected, high prob. Shortness of breath, chest pain EXAM: NUCLEAR MEDICINE PERFUSION LUNG SCAN TECHNIQUE: Perfusion images were obtained in multiple projections after intravenous  injection of radiopharmaceutical. Ventilation scans intentionally deferred if perfusion scan and chest x-ray adequate for interpretation during COVID 19 epidemic. RADIOPHARMACEUTICALS:  4.3 mCi Tc-20m MAA IV COMPARISON:  Chest x-ray today FINDINGS: No perfusion defects to suggest pulmonary embolus. IMPRESSION: No evidence of pulmonary embolus. Electronically Signed   By: Rolm Baptise M.D.   On: 09/17/2021 20:06   CARDIAC CATHETERIZATION  Result Date: 09/19/2021 CONCLUSIONS: Severe diffuse calcified coronary disease of the native arteries with 100% proximal RCA, 90% ostial circumflex, 99% distal left main, severely and diffusely diseased proximal to mid LAD with threatened septal perforator. Diffusely diseased saphenous vein graft to the right coronary.  Ostial to proximal 70% stenosis. Widely patent sequential graft to first and second obtuse marginal Widely patent saphenous vein graft to the first diagonal Widely patent LIMA to the mid LAD Aortic valve is heavily calcified, was difficult to cross, but without significant gradient.  LVEDP 10 mmHg. Total contrast 70 cc RECOMMENDATIONS: Continue IV fluids for 18 to 24 hours.  He was given an additional 250 cc of saline as a bolus after identifying that LVEDP was 9 mmHg. Monitor kidney function closely.  Contrast exposure was 70 cc. The saphenous vein graft to the right coronary can be stented although it is not so critical that it should be causing enzyme elevation.  Enzyme elevation likely related to left coronary native disease in the septal perforator territory.   DG CHEST PORT 1 VIEW  Result Date: 09/20/2021 CLINICAL DATA:  Chest pain, shortness of breath, fatigue EXAM: PORTABLE CHEST 1 VIEW COMPARISON:  Chest radiograph 09/17/2021 FINDINGS: Median sternotomy wires are again noted. The cardiomediastinal silhouette is stable. There is no focal consolidation or pulmonary edema. There is no pleural effusion or pneumothorax. Calcified pleural plaques are again  noted in the left apex. There is no acute osseous abnormality. IMPRESSION: Stable chest with no  radiographic evidence of acute cardiopulmonary process. Electronically Signed   By: Valetta Mole M.D.   On: 09/20/2021 10:08   ECHOCARDIOGRAM COMPLETE  Result Date: 09/18/2021    ECHOCARDIOGRAM REPORT   Patient Name:   KAULIN CHAVES [facility] Date of Exam: 09/18/2021 Medical Rec #:  [id number]        Height:       70.0 in Accession #:    [id number]       Weight:       225.3 lb Date of Birth:  February 18, 1946        BSA:          2.196 m Patient Age:    24 years         BP:           165/90 mmHg Patient Gender: M                HR:           97 bpm. Exam Location:  Inpatient Procedure: 2D Echo, Cardiac Doppler, Color Doppler and Intracardiac            Opacification Agent Indications:    Acute myocardial infarction  History:        Patient has prior history of Echocardiogram examinations, most                 recent 05/22/2009. Prior CABG; Risk Factors:Diabetes and                 Hypertension. CKD. GERD.  Sonographer:    [name] Lefort RDCS (AE) Referring Phys: [phone] JARED M GARDNER  Sonographer Comments: Technically difficult study due to poor echo windows and no subcostal window. IMPRESSIONS  1. Left ventricular ejection fraction, by estimation, is 60 to 65%. The left ventricle has normal function. The left ventricle has no regional wall motion abnormalities. Left ventricular diastolic parameters are consistent with Grade I diastolic dysfunction (impaired relaxation). Elevated left ventricular end-diastolic pressure.  2. Right ventricular systolic function is moderately reduced. The right ventricular size is normal.  3. The mitral valve is normal in structure. No evidence of mitral valve regurgitation. No evidence of mitral stenosis.  4. The AV is poorly visualized to comment on structure. There does appear to be come calcification of the valve. . The aortic valve was not well visualized. Aortic valve regurgitation is not visualized.  No aortic stenosis is present. FINDINGS  Left Ventricle: Left ventricular ejection fraction, by estimation, is 60 to 65%. The left ventricle has normal function. The left ventricle has no regional wall motion abnormalities. Definity contrast agent was given IV to delineate the left ventricular  endocardial borders. The left ventricular internal cavity size was normal in size. There is no left ventricular hypertrophy. Left ventricular diastolic parameters are consistent with Grade I diastolic dysfunction (impaired relaxation). Elevated left ventricular end-diastolic pressure. Right Ventricle: The right ventricular size is normal. No increase in right ventricular wall thickness. Right ventricular systolic function is moderately reduced. Left Atrium: Left atrial size was normal in size. Right Atrium: Right atrial size was normal in size. Pericardium: There is no evidence of pericardial effusion. Mitral Valve: The mitral valve is normal in structure. No evidence of mitral valve regurgitation. No evidence of mitral valve stenosis. Tricuspid Valve: The tricuspid valve is normal in structure. Tricuspid valve regurgitation is mild . No evidence of tricuspid stenosis. Aortic Valve: The AV is poorly visualized to comment on structure. There does appear to be come calcification of  the valve. The aortic valve was not well visualized. Aortic valve regurgitation is not visualized. No aortic stenosis is present. Aortic valve mean gradient measures 3.0 mmHg. Aortic valve peak gradient measures 4.8 mmHg. Aortic valve area, by VTI measures 2.47 cm. Pulmonic Valve: The pulmonic valve was normal in structure. Pulmonic valve regurgitation is not visualized. No evidence of pulmonic stenosis. Aorta: The aortic root is normal in size and structure. Venous: The inferior vena cava was not well visualized. IAS/Shunts: No atrial level shunt detected by color flow Doppler.  LEFT VENTRICLE PLAX 2D LVOT diam:     1.90 cm   Diastology LV SV:          52        LV e' medial:    5.66 cm/s LV SV Index:   24        LV E/e' medial:  14.8 LVOT Area:     2.84 cm  LV e' lateral:   6.09 cm/s                          LV E/e' lateral: 13.7  RIGHT VENTRICLE RV S prime:     10.30 cm/s TAPSE (M-mode): 1.3 cm LEFT ATRIUM             Index        RIGHT ATRIUM           Index LA Vol (A2C):   37.4 ml 17.03 ml/m  RA Area:     14.40 cm LA Vol (A4C):   69.8 ml 31.79 ml/m  RA Volume:   31.30 ml  14.25 ml/m LA Biplane Vol: 54.3 ml 24.73 ml/m  AORTIC VALVE AV Area (Vmax):    2.51 cm AV Area (Vmean):   2.75 cm AV Area (VTI):     2.47 cm AV Vmax:           110.00 cm/s AV Vmean:          77.800 cm/s AV VTI:            0.210 m AV Peak Grad:      4.8 mmHg AV Mean Grad:      3.0 mmHg LVOT Vmax:         97.50 cm/s LVOT Vmean:        75.400 cm/s LVOT VTI:          0.183 m LVOT/AV VTI ratio: 0.87  AORTA Ao Root diam: 2.70 cm Ao Asc diam:  2.90 cm MITRAL VALVE                TRICUSPID VALVE MV Area (PHT): 2.05 cm     TR Peak grad:   18.5 mmHg MV Decel Time: 370 msec     TR Vmax:        215.00 cm/s MV E velocity: 83.70 cm/s MV A velocity: 163.00 cm/s  SHUNTS MV E/A ratio:  0.51         Systemic VTI:  0.18 m                             Systemic Diam: 1.90 cm Fransico Him MD Electronically signed by Fransico Him MD Signature Date/Time: 09/18/2021/11:30:29 AM    Final    VAS [place] LOWER EXTREMITY VENOUS (DVT) (ONLY [facility] & [facility])  Result Date: 09/17/2021  Lower Venous DVT Study Patient Name:  RITESH OPARA [facility]  Date of Exam:  09/17/2021 Medical Rec #: [id number]         Accession #:    [id number] Date of Birth: [birth year]         Patient Gender: M Patient Age:   61 years Exam Location:  [facility] Procedure:      VAS [place] LOWER EXTREMITY VENOUS (DVT) Referring Phys: BRITNI HENDERLY --------------------------------------------------------------------------------  Indications: Edema.  Comparison Study: No prior study Performing Technologist: Maudry Mayhew MHA, RDMS, RVT, RDCS   Examination Guidelines: A complete evaluation includes B-mode imaging, spectral Doppler, color Doppler, and power Doppler as needed of all accessible portions of each vessel. Bilateral testing is considered an integral part of a complete examination. Limited examinations for reoccurring indications may be performed as noted. The reflux portion of the exam is performed with the patient in reverse Trendelenburg.  +---------+---------------+---------+-----------+----------+--------------+  RIGHT     Compressibility Phasicity Spontaneity Properties Thrombus Aging  +---------+---------------+---------+-----------+----------+--------------+  CFV       Full            Yes       Yes                                    +---------+---------------+---------+-----------+----------+--------------+  SFJ       Full                                                             +---------+---------------+---------+-----------+----------+--------------+  FV Prox   Full                                                             +---------+---------------+---------+-----------+----------+--------------+  FV Mid    Full                                                             +---------+---------------+---------+-----------+----------+--------------+  FV Distal Full                                                             +---------+---------------+---------+-----------+----------+--------------+  PFV       Full                                                             +---------+---------------+---------+-----------+----------+--------------+  POP       Full            Yes       Yes                                    +---------+---------------+---------+-----------+----------+--------------+  PTV       Full                                                             +---------+---------------+---------+-----------+----------+--------------+  PERO      Full                                                              +---------+---------------+---------+-----------+----------+--------------+   +---------+---------------+---------+-----------+----------+--------------+  LEFT      Compressibility Phasicity Spontaneity Properties Thrombus Aging  +---------+---------------+---------+-----------+----------+--------------+  CFV       Full            Yes       Yes                                    +---------+---------------+---------+-----------+----------+--------------+  SFJ       Full                                                             +---------+---------------+---------+-----------+----------+--------------+  FV Prox   Full                                                             +---------+---------------+---------+-----------+----------+--------------+  FV Mid    Full                                                             +---------+---------------+---------+-----------+----------+--------------+  FV Distal Full                                                             +---------+---------------+---------+-----------+----------+--------------+  PFV       Full                                                             +---------+---------------+---------+-----------+----------+--------------+  POP       Full            Yes       Yes                                    +---------+---------------+---------+-----------+----------+--------------+  PTV       Full                                                             +---------+---------------+---------+-----------+----------+--------------+  PERO      Full                                                             +---------+---------------+---------+-----------+----------+--------------+     Summary: RIGHT: - There is no evidence of deep vein thrombosis in the lower extremity.  - No cystic structure found in the popliteal fossa.  LEFT: - There is no evidence of deep vein thrombosis in the lower extremity.  - No cystic structure found in the popliteal fossa.   *See table(s) above for measurements and observations. Electronically signed by Harold Barban MD on 09/17/2021 at 9:42:23 PM.    Final     Lab Data:  CBC: Recent Labs  Lab 09/17/21 1247 09/17/21 1856 09/18/21 0606 09/19/21 0147 09/20/21 0100  WBC 13.7*  --  11.4* 11.0* 9.9  HGB 14.3 11.9* 13.2 12.2* 11.4*  HCT 44.0 35.0* 38.4* 38.1* 35.1*  MCV 100.5*  --  96.5 99.7 97.2  PLT 172  --  135* 127* 174*   Basic Metabolic Panel: Recent Labs  Lab 09/18/21 0606 09/19/21 0147 09/20/21 0100 09/21/21 0106 09/22/21 0126  NA 136 137 136 136 139  K 4.6 4.4 4.2 5.9* 4.2  CL 108 108 112* 106 110  CO2 18* 16* 18* 18* 20*  GLUCOSE 165* 204* 140* 198* 133*  BUN 50* 41* 36* 34* 33*  CREATININE 1.95* 2.15* 1.86* 2.01* 1.92*  CALCIUM 8.9 8.6* 8.1* 8.8* 9.0   GFR: Estimated Creatinine Clearance: 38 mL/min (A) (by C-G formula based on SCr of 1.92 mg/dL (H)). Liver Function Tests: Recent Labs  Lab 09/17/21 2323  AST 25  ALT 36  ALKPHOS 33*  BILITOT 0.8  PROT 5.3*  ALBUMIN 3.0*   No results for input(s): LIPASE, AMYLASE in the last 168 hours. No results for input(s): AMMONIA in the last 168 hours. Coagulation Profile: No results for input(s): INR, PROTIME in the last 168 hours. Cardiac Enzymes: No results for input(s): CKTOTAL, CKMB, CKMBINDEX, TROPONINI in the last 168 hours. BNP (last 3 results) No results for input(s): PROBNP in the last 8760 hours. HbA1C: No results for input(s): HGBA1C in the last 72 hours. CBG: Recent Labs  Lab 09/22/21 1121 09/22/21 1531 09/22/21 2113 09/23/21 0603 09/23/21 1127  GLUCAP 205* 381* 262* 177* 288*   Lipid Profile: No results for input(s): CHOL, HDL, LDLCALC, TRIG, CHOLHDL, LDLDIRECT in the last 72 hours. Thyroid Function Tests: No results for input(s): TSH, T4TOTAL, FREET4, T3FREE, THYROIDAB in the last 72 hours. Anemia Panel: No results for input(s): VITAMINB12, FOLATE, FERRITIN, TIBC, IRON, RETICCTPCT in the last 72 hours. Urine  analysis:    Component Value Date/Time   COLORURINE YELLOW 09/18/2021 0259   APPEARANCEUR CLEAR 09/18/2021 0259   LABSPEC 1.020 09/18/2021 0259   PHURINE 5.0 09/18/2021 0259   GLUCOSEU >=500 (A) 09/18/2021 0259   GLUCOSEU NEGATIVE 11/25/2010 0951   HGBUR  NEGATIVE 09/18/2021 0259   BILIRUBINUR NEGATIVE 09/18/2021 0259   BILIRUBINUR neg. 08/30/2012 [place] 09/18/2021 0259   PROTEINUR NEGATIVE 09/18/2021 0259   UROBILINOGEN 0.2 08/30/2012 1548   UROBILINOGEN 0.2 11/25/2010 0951   NITRITE NEGATIVE 09/18/2021 0259   LEUKOCYTESUR NEGATIVE 09/18/2021 0259     Caro Brundidge M.D. [facility] 09/23/2021, 12:38 PM  Available via Epic secure chat 7am-7pm After 7 pm, please refer to night coverage provider listed on amion.

## 2021-09-23 NOTE — Progress Notes (Signed)
Occupational Therapy Treatment Patient Details Name: Kurt Aguilar MRN: [id number] DOB: Jul 01, 1946 Today's Date: 09/23/2021   History of present illness Pt is a 76 y/o male admitted secondary to worsening SOB and chest pain. Pt with recent diagnosis of BOOP and Covid. Pt is s/p heart cath on 1/12. PMH includes HTN, DM, PE, and prostate cancer.   OT comments  OT treatment session with focus on self-care re-education, short-distance functional mobility, dynamic balance and d/c planning. Patient requires Min guard to Min A overall for ADLs, ADL transfers and short-distance functional mobility. Patient also continues to be limited by deficits listed below including decreased activity tolerance, SOB, decreased dynamic balance. Wife present at bedside throughout session reporting inability to provide necessary level of assist for patient to return home with her to [place] apartment at [facility]. D/c plan updated to SNF rehab with preference of [facility]. OT will continue to follow acutely.    Recommendations for follow up therapy are one component of a multi-disciplinary discharge planning process, led by the attending physician.  Recommendations may be updated based on patient status, additional functional criteria and insurance authorization.    Follow Up Recommendations  Skilled nursing-short term rehab (<3 hours/day)    Assistance Recommended at Discharge Frequent or constant Supervision/Assistance  Patient can return home with the following  A little help with walking and/or transfers;A little help with bathing/dressing/bathroom   Equipment Recommendations  Other (comment) (Defer to next level of care)    Recommendations for Other Services      Precautions / Restrictions Precautions Precautions: Fall Restrictions Weight Bearing Restrictions: No       Mobility Bed Mobility               General bed mobility comments: Seated in recliner upon entry.    Transfers Overall  transfer level: Needs assistance Equipment used: None Transfers: Sit to/from Stand Sit to Stand: Min guard           General transfer comment: Min gaurd for sit to stand from various surfaces.     Balance Overall balance assessment: Needs assistance Sitting-balance support: No upper extremity supported;Feet supported Sitting balance-Leahy Scale: Fair     Standing balance support: Bilateral upper extremity supported Standing balance-Leahy Scale: Fair Standing balance comment: Min guard for short-distance mobility in hosptial room without AD.                           ADL either performed or assessed with clinical judgement   ADL Overall ADL's : Needs assistance/impaired     Grooming: Supervision/safety;Standing Grooming Details (indicate cue type and reason): Oral hygiene standing at sink level with supervison A. Quick to fatigue.                 Toilet Transfer: Magazine features editor Details (indicate cue type and reason): Min guard without AD with focus on dynamic balance. Toileting- Clothing Manipulation and Hygiene: Supervision/safety;Sit to/from stand Toileting - Clothing Manipulation Details (indicate cue type and reason): Supervision A for safety. Limited by SOB.            Extremity/Trunk Assessment              Vision       Perception     Praxis      Cognition Arousal/Alertness: Awake/alert Behavior During Therapy: WFL for tasks assessed/performed Overall Cognitive Status: Within Functional Limits for tasks assessed  Exercises     Shoulder Instructions       General Comments Wife present at bedside.    Pertinent Vitals/ Pain       Pain Assessment: No/denies pain  Home Living                                          Prior Functioning/Environment              Frequency  Min 2X/week        Progress Toward Goals  OT  Goals(current goals can now be found in the care plan section)  Progress towards OT goals: Progressing toward goals  Acute Rehab OT Goals Patient Stated Goal: To go to rehab at [facility] OT Goal Formulation: With patient Time For Goal Achievement: 10/04/21 Potential to Achieve Goals: Good ADL Goals Pt Will Perform Grooming: with modified independence;standing Pt Will Perform Lower Body Dressing: with modified independence;sit to/from stand Pt Will Transfer to Toilet: with modified independence;ambulating;bedside commode Pt Will Perform Toileting - Clothing Manipulation and hygiene: with modified independence;sit to/from stand Additional ADL Goal #1: Pt will utilize 3 energy conservation techniques during daily routine to optimize tolerance and safety.  Plan      Co-evaluation                 AM-PAC OT "6 Clicks" Daily Activity     Outcome Measure   Help from another person eating meals?: None Help from another person taking care of personal grooming?: A Little Help from another person toileting, which includes using toliet, bedpan, or urinal?: A Little Help from another person bathing (including washing, rinsing, drying)?: A Little Help from another person to put on and taking off regular upper body clothing?: A Little Help from another person to put on and taking off regular lower body clothing?: A Little 6 Click Score: 19    End of Session Equipment Utilized During Treatment: Gait belt;Rolling walker (2 wheels)  OT Visit Diagnosis: Other abnormalities of gait and mobility (R26.89);Muscle weakness (generalized) (M62.81)   Activity Tolerance Patient limited by fatigue   Patient Left in chair;with call bell/phone within reach;with family/visitor present   Nurse Communication Mobility status        Time: 1610-9604 OT Time Calculation (min): 25 min  Charges: OT General Charges $OT Visit: 1 Visit OT Treatments $Self Care/Home Management : 8-22 mins $Therapeutic  Activity: 8-22 mins  Jashae Wiggs H. OTR/L Supplemental OT, Department of rehab services [phone]  Aleane Wesenberg R H. 09/23/2021, 9:27 AM

## 2021-09-23 NOTE — TOC Progression Note (Signed)
Transition of Care [facility]) - Progression Note    Patient Details  Name: ZELMA MAZARIEGO MRN: [id number] Date of Birth: 06/15/1946  Transition of Care [facility]) CM/SW Contact  Reece Agar, [place] Phone Number: 09/23/2021, 2:03 PM  Clinical Narrative:    There was no insurance started for pt to DC today. CSW started auth with [employer] today, pt can DC when auth is back.   Expected Discharge Plan: Skilled Nursing Facility Barriers to Discharge: Ship broker, SNF Pending bed offer, Continued Medical Work up  Expected Discharge Plan and Services Expected Discharge Plan: [place] In-house Referral: NA Discharge Planning Services: NA Post Acute Care Choice: NA Living arrangements for the past 2 months: [place] ([place])                 DME Arranged: N/A DME Agency: NA       HH Arranged: NA HH Agency: NA         Social Determinants of Health (SDOH) Interventions    Readmission Risk Interventions Readmission Risk Prevention Plan 09/20/2021  Transportation Screening Complete  Medication Review Press photographer) Referral to Pharmacy  PCP or Specialist appointment within 3-5 days of discharge Not Complete  PCP/Specialist Appt Not Complete comments pending patient is not medically stable but does have a PCP  [place] or [place] Complete  SW Recovery Care/Counseling Consult Complete  [place] Not Applicable  Some recent data might be hidden

## 2021-09-23 NOTE — Progress Notes (Signed)
Progress Note  Patient Name: Kurt Aguilar Date of Encounter: 09/23/2021  Primary Cardiologist:   [name], MD   Subjective   He is weak.  Continued cough.  Feels breathless with walking but sats OK.   Inpatient Medications    Scheduled Meds:  aspirin  81 mg Oral Daily   benzonatate  200 mg Oral TID   ezetimibe  10 mg Oral Daily   famotidine  20 mg Oral QHS   heparin  5,000 Units Subcutaneous Q8H   insulin aspart  0-15 Units Subcutaneous TID WC   insulin aspart  0-5 Units Subcutaneous QHS   insulin NPH Human  15 Units Subcutaneous QAC breakfast   metoprolol tartrate  50 mg Oral BID   pantoprazole  80 mg Oral BID AC   predniSONE  40 mg Oral Q breakfast   rosuvastatin  40 mg Oral Daily   sodium chloride flush  3 mL Intravenous Q12H   sodium chloride flush  3 mL Intravenous Q12H   sodium chloride flush  3 mL Intravenous Q12H   Continuous Infusions:  sodium chloride     PRN Meds: sodium chloride, acetaminophen, acetaminophen, guaiFENesin-codeine, hydrALAZINE, ipratropium-albuterol, nitroGLYCERIN, ondansetron (ZOFRAN) IV, ondansetron (ZOFRAN) IV, sodium chloride flush   Vital Signs    Vitals:   09/22/21 2056 09/22/21 2141 09/22/21 2307 09/23/21 0420  BP:   114/79 130/78  Pulse:  (!) 106 96 66  Resp:   20 19  Temp:   97.9 F (36.6 C) 97.9 F (36.6 C)  TempSrc:   Oral Oral  SpO2: 97%  94% 94%  Weight:      Height:        Intake/Output Summary (Last 24 hours) at 09/23/2021 0734 Last data filed at 09/23/2021 2725 Gross per 24 hour  Intake 460 ml  Output 1800 ml  Net -1340 ml   Filed Weights   09/20/21 0652 09/21/21 0311 09/22/21 0553  Weight: 103.4 kg 101.9 kg 102.4 kg    Telemetry    Sinus tach with premature ectopy - Personally Reviewed  ECG    NA - Personally Reviewed  Physical Exam   GEN: No acute distress.   Neck: No  JVD Cardiac: RRR, no murmurs, rubs, or gallops.  Respiratory: Clear  to auscultation bilaterally. GI: Soft,  nontender, non-distended  MS:   Mild leg edema; No deformity. Neuro:  Nonfocal  Psych: Normal affect   Labs    Chemistry Recent Labs  Lab 09/17/21 2323 09/18/21 0606 09/20/21 0100 09/21/21 0106 09/22/21 0126  NA  --    < > 136 136 139  K  --    < > 4.2 5.9* 4.2  CL  --    < > 112* 106 110  CO2  --    < > 18* 18* 20*  GLUCOSE  --    < > 140* 198* 133*  BUN  --    < > 36* 34* 33*  CREATININE  --    < > 1.86* 2.01* 1.92*  CALCIUM  --    < > 8.1* 8.8* 9.0  PROT 5.3*  --   --   --   --   ALBUMIN 3.0*  --   --   --   --   AST 25  --   --   --   --   ALT 36  --   --   --   --   ALKPHOS 33*  --   --   --   --  BILITOT 0.8  --   --   --   --   GFRNONAA  --    < > 37* 34* 35*  ANIONGAP  --    < > 6 12 9    < > = values in this interval not displayed.     Hematology Recent Labs  Lab 09/18/21 0606 09/19/21 0147 09/20/21 0100  WBC 11.4* 11.0* 9.9  RBC 3.98* 3.82* 3.61*  HGB 13.2 12.2* 11.4*  HCT 38.4* 38.1* 35.1*  MCV 96.5 99.7 97.2  MCH 33.2 31.9 31.6  MCHC 34.4 32.0 32.5  RDW 13.6 13.5 13.6  PLT 135* 127* 123*    Cardiac EnzymesNo results for input(s): TROPONINI in the last 168 hours. No results for input(s): TROPIPOC in the last 168 hours.   BNP Recent Labs  Lab 09/17/21 1731  BNP 37.7     DDimer  Recent Labs  Lab 09/17/21 1731  DDIMER 0.34     Radiology    No results found.  Cardiac Studies   Echocardiogram 09/18/2021: Impressions: 1. Left ventricular ejection fraction, by estimation, is 60 to 65%. The  left ventricle has normal function. The left ventricle has no regional  wall motion abnormalities. Left ventricular diastolic parameters are  consistent with Grade I diastolic  dysfunction (impaired relaxation). Elevated left ventricular end-diastolic  pressure.   2. Right ventricular systolic function is moderately reduced. The right  ventricular size is normal.   3. The mitral valve is normal in structure. No evidence of mitral valve   regurgitation. No evidence of mitral stenosis.   4. The AV is poorly visualized to comment on structure. There does appear  to be come calcification of the valve. . The aortic valve was not well  visualized. Aortic valve regurgitation is not visualized. No aortic  stenosis is present.  _______________   Left Cardiac Catheterization 09/19/2021: Conclusions: Severe diffuse calcified coronary disease of the native arteries with 100% proximal RCA, 90% ostial circumflex, 99% distal left main, severely and diffusely diseased proximal to mid LAD with threatened septal perforator. Diffusely diseased saphenous vein graft to the right coronary.  Ostial to proximal 70% stenosis. Widely patent sequential graft to first and second obtuse marginal Widely patent saphenous vein graft to the first diagonal Widely patent LIMA to the mid LAD Aortic valve is heavily calcified, was difficult to cross, but without significant gradient.  LVEDP 10 mmHg. Total contrast 70 cc   Recommendations: Continue IV fluids for 18 to 24 hours.  He was given an additional 250 cc of saline as a bolus after identifying that LVEDP was 9 mmHg. Monitor kidney function closely.  Contrast exposure was 70 cc. The saphenous vein graft to the right coronary can be stented although it is not so critical that it should be causing enzyme elevation.  Enzyme elevation likely related to left coronary native disease in the septal perforator territory.   Diagnostic Dominance: Right    Patient Profile     76 y.o. male with a history of CAD s/p CABG x5 in 1997, hypertension, hyperlipidemia, type 2 diabetes mellitus, CKD stage III-IV, sleep apnea on CPAP, and GERD who was admitted on 09/17/2021 with chest pain and shortness of breath and found to have elevated troponin concerning for NSTEMI.    Assessment & Plan    CAD/NSTEMI:  Demand ischemia.  Medical management.    SOB:   No evidence of HF.  Plan per primary.   DYSLIPIDEMIA:  Continue  statin.   CKD IIIB:  Relatively stable.     HTN:  Norvasc suggested yesterday but not ordered.  I instead will increase the beta blocker.   We will see as needed.  Please call with further questions.   For questions or updates, please contact [place] Please consult [email or web address] for contact info under Cardiology/STEMI.   Signed, Dorris Carnes, MD  09/23/2021, 7:34 AM

## 2021-09-23 NOTE — Progress Notes (Signed)
Inpatient Diabetes Program Recommendations  AACE/ADA: New Consensus Statement on Inpatient Glycemic Control (2015)  Target Ranges:  Prepandial:   less than 140 mg/dL      Peak postprandial:   less than 180 mg/dL (1-2 hours)      Critically ill patients:  140 - 180 mg/dL   Lab Results  Component Value Date   GLUCAP 177 (H) 09/23/2021   HGBA1C 9.5 (A) 09/13/2021    Review of Glycemic Control  Latest Reference Range & Units 09/22/21 06:04 09/22/21 11:21 09/22/21 15:31 09/22/21 21:13 09/23/21 06:03  Glucose-Capillary 70 - 99 mg/dL 113 (H) 205 (H) 381 (H) 262 (H) 177 (H)  (H): Data is abnormally high  Diabetes history: DM 2 Outpatient Diabetes medications:  70/30 20 units q AM, Invokana 100 mg every other day Current orders for Inpatient glycemic control:  Novolog moderate tid with meals and HS NPH 15 units q AM Prednisone 40 mg qd  Inpatient Diabetes Program Recommendations:   Postprandial CBGs elevated. Please consider: -D/C NPH -70/30 insulin 20 units q am ac breakfast -Consider adding small dose of 70/30 q pm ac dinner Secure chat sent to Dr. Tana Coast.  Thank you, Nani Gasser. Benedicta Sultan, RN, MSN, CDE  Diabetes Coordinator Inpatient Glycemic Control Team Team Pager [phone] (8am-5pm) 09/23/2021 10:43 AM

## 2021-09-23 NOTE — Plan of Care (Signed)
°  Problem: Education: Goal: Understanding of cardiac disease, CV risk reduction, and recovery process will improve Outcome: Progressing   Problem: Activity: Goal: Ability to tolerate increased activity will improve Outcome: Progressing   Problem: Health Behavior/Discharge Planning: Goal: Ability to safely manage health-related needs after discharge will improve Outcome: Progressing   

## 2021-09-24 LAB — GLUCOSE, CAPILLARY
Glucose-Capillary: 127 mg/dL — ABNORMAL HIGH (ref 70–99)
Glucose-Capillary: 169 mg/dL — ABNORMAL HIGH (ref 70–99)
Glucose-Capillary: 343 mg/dL — ABNORMAL HIGH (ref 70–99)
Glucose-Capillary: 312 mg/dL — ABNORMAL HIGH (ref 70–99)

## 2021-09-24 LAB — RESP PANEL BY RT-PCR (FLU A&B, COVID) ARPGX2
Influenza A by PCR: NEGATIVE
Influenza B by PCR: NEGATIVE
SARS Coronavirus 2 by RT PCR: NEGATIVE

## 2021-09-24 MED ORDER — NITROGLYCERIN 0.4 MG SL SUBL: 0.4000 mg | SUBLINGUAL_TABLET | SUBLINGUAL | 12 refills | Status: DC | PRN

## 2021-09-24 MED ORDER — METOPROLOL TARTRATE 50 MG PO TABS: 50.0000 mg | ORAL_TABLET | Freq: Two times a day (BID) | ORAL | 3 refills | Status: DC

## 2021-09-24 MED ORDER — BENZONATATE 200 MG PO CAPS: 200.0000 mg | ORAL_CAPSULE | Freq: Three times a day (TID) | ORAL | 1 refills | Status: DC | PRN

## 2021-09-24 MED ORDER — PREDNISONE 5 MG PO TABS: 10.0000 mg | ORAL_TABLET | Freq: Two times a day (BID) | ORAL | Status: DC

## 2021-09-24 MED ORDER — ALBUTEROL SULFATE HFA 108 (90 BASE) MCG/ACT IN AERS: 2.0000 | INHALATION_SPRAY | Freq: Four times a day (QID) | RESPIRATORY_TRACT | 2 refills | Status: DC | PRN

## 2021-09-24 MED ORDER — PREDNISONE 10 MG PO TABS: ORAL_TABLET | ORAL | 0 refills | Status: DC

## 2021-09-24 MED ORDER — ROSUVASTATIN CALCIUM 40 MG PO TABS: 40.0000 mg | ORAL_TABLET | Freq: Every day | ORAL | 3 refills | Status: DC

## 2021-09-24 NOTE — TOC Progression Note (Addendum)
Transition of Care [facility]) - Progression Note    Patient Details  Name: YARON GRASSE MRN: [id number] Date of Birth: Oct 23, 1945  Transition of Care [facility]) CM/SW Contact  Reece Agar, [place] Phone Number: 09/24/2021, 4:24 PM  Clinical Narrative:    Contacted [employer] for insurance auth, the auth was still under review. DC pending confirmation of auth.  [employer] contacted CSW after 4:30pm for auth approval (SNF ref# [id number]; transport ref# [id number]). CSW contacted [facility] for confirmation of DC before contacting [facility], no answer. CSW contacted [facility] main number ad was told all admission staff was gone for the day to call back tomorrow.   Expected Discharge Plan: [place] Barriers to Discharge: Ship broker, SNF Pending bed offer, Continued Medical Work up  Expected Discharge Plan and Services Expected Discharge Plan: [place] In-house Referral: NA Discharge Planning Services: NA Post Acute Care Choice: NA Living arrangements for the past 2 months: [place] ([place]) Expected Discharge Date: 09/24/21               DME Arranged: N/A DME Agency: NA       HH Arranged: NA HH Agency: NA         Social Determinants of Health (SDOH) Interventions    Readmission Risk Interventions Readmission Risk Prevention Plan 09/20/2021  Transportation Screening Complete  Medication Review Press photographer) Referral to Pharmacy  PCP or Specialist appointment within 3-5 days of discharge Not Complete  PCP/Specialist Appt Not Complete comments pending patient is not medically stable but does have a PCP  [place] or [place] Complete  SW Recovery Care/Counseling Consult Complete  [place] Not Applicable  Some recent data might be hidden

## 2021-09-24 NOTE — Discharge Summary (Signed)
Physician Discharge Summary   Patient: Kurt Aguilar MRN: [id number] DOB: [birth year]  Admit date:     09/17/2021  Discharge date: 09/24/21  Discharge Physician: Estill Cotta   PCP: [name], MD   Recommendations at discharge:     Discharge Diagnoses    Acute coronary syndrome / NSTEMI(HCC) Acute kidney injury on CKD stage IIIb Lactic acidosis Shortness of breath with coughing   Essential hypertension   Right bundle branch block   CAD (coronary artery disease)   DM2 (diabetes mellitus, type 2) (HCC)   Steroid dependent (HCC)   AKI (acute kidney injury) ([place])   Stage 3b chronic kidney disease (CKD) [facility])     Hospital Course     Patient is a 76 year old male with history of CAD status post CABG in 1997, DM2, HTN, HLD, Boop, recent COVID-19 infection in December 2022, chronic steroid dependency presented to ED with mild intermittent chest pain for a month, worse with exertion, dyspnea.  Patient was found to have elevated troponins and was admitted for further work-up    NSTEMI [facility]) with known history of CAD status post CABG x5 in 1997 -2D echo showed no wall motion abnormality, normal EF, likely demand ischemia -Left heart cardiac cath showed multivessel native coronary artery diseasesaphenous vein to right coronary artery with 70% ostial to proximal stenosis, SVG to OM 1 and 2 patent, SVG to first diagonal patent, LIMA to LAD patent.  -Continue aspirin, beta-blocker, Crestor, Zetia,      Essential hypertension -BP stable, continue beta-blocker    Hyperkalemia -Resolved   Acute on chronic CKD stage IIIb, lactic acidosis -Creatinine 2.34 on admission, baseline around 1.8-1.9 -Creatinine improved, close to baseline.  Completed IV fluid hydration.      Shortness of breath with cough -Per patient 4 weeks, dry persistent cough, possibly due to COVID-19 in December (positive on 12/4), underlying history of BOOP -CXR with no pneumonia or effusion.  VQ scan negative  for PE.  Normal BNP -Continues to feel winded with exertion, coughing spasms. -Continue prednisone 40 mg daily with a taper upon discharge. (Outpatient on 10 mg BID) -Continue scheduled nebs, incentive spirometry, Tessalon Perles  -Home O2 evaluation done on 1/15, did not meet criteria for home O2   Hyperlipidemia -LDL 74 in 04/2021.  Continue Crestor 40 mg daily, Zetia 10 mg daily   Diabetes mellitus type 2, on long-term insulin with renal complications -Hemoglobin A1c 9.5 on 09/13/2021 --Continue insulin regimen, hopefully CBGs will continue to improve once prednisone taper is completed and back to maintenance dose   BOOP - follows Dr [name] outpatient, continue prednisone     Obesity Estimated body mass index is 32.39 kg/m as calculated from the following:   Height as of this encounter: 5\' 10"  (1.778 m).   Weight as of this encounter: 102.4 kg.   Pain control - [employer] Controlled Substance Reporting System database was reviewed. and patient was instructed, not to drive, operate heavy machinery, perform activities at heights, swimming or participation in water activities or provide baby-sitting services while on Pain, Sleep and Anxiety Medications; until their outpatient Physician has advised to do so again. Also recommended to not to take more than prescribed Pain, Sleep and Anxiety Medications.   Consultants:  Cardiology Procedures performed: Cardiac cath Disposition: Skilled nursing facility Diet recommendation: Cardiac and Carb modified diet  DISCHARGE MEDICATION: Allergies as of 09/24/2021       Reactions   Oxycodone-acetaminophen Other (See Comments)   Stops breathing; has tolerated  Tylenol before        Medication List     STOP taking these medications    Mucinex DM Maximum Strength 60-1200 MG Tb12       TAKE these medications    albuterol 108 (90 Base) MCG/ACT inhaler Commonly known as: VENTOLIN HFA Inhale 2 puffs into the lungs every 6 (six) hours  as needed for wheezing or shortness of breath.   allopurinol 100 MG tablet Commonly known as: ZYLOPRIM Take 1 tablet (100 mg total) by mouth 2 (two) times daily. What changed: additional instructions   aspirin 81 MG tablet Take 1 tablet (81 mg total) by mouth 2 (two) times daily after a meal.   benzonatate 200 MG capsule Commonly known as: TESSALON Take 1 capsule (200 mg total) by mouth 3 (three) times daily as needed for cough.   CINNAMON PO Take 1,000 mg by mouth 2 (two) times daily.   colchicine 0.6 MG tablet TAKE 1 TABLET BY MOUTH TWICE A DAY WITH A MEAL AS NEEDED FOR ACUTE FLARE OF GOUT   ezetimibe 10 MG tablet Commonly known as: ZETIA Take 1 tablet (10 mg total) by mouth daily.   famotidine 20 MG tablet Commonly known as: PEPCID TAKE 1 TABLET BY MOUTH EVERYDAY AT BEDTIME   freestyle lancets 1 EACH BY OTHER ROUTE 4 (FOUR) TIMES DAILY. E11.65   FreeStyle Lite Devi 1 each by Does not apply route 4 (four) times daily. E11.65   FREESTYLE LITE test strip Generic drug: glucose blood 1 each by Other route 4 (four) times daily. E11.65   GLUCOSAMINE PO Take 1,500 mg by mouth 2 (two) times daily.   HYDROcodone bit-homatropine 5-1.5 MG/5ML syrup Commonly known as: HYCODAN Take 5 mLs by mouth every 6 (six) hours as needed for cough.   Invokana 100 MG Tabs tablet Generic drug: canagliflozin TAKE 1 TABLET BY MOUTH EVERY OTHER DAY   LUBRICATING EYE DROPS OP Place 1 drop into both eyes daily as needed (dry eyes).   Lutein 20 MG Caps Take 20 mg by mouth daily.   methocarbamol 500 MG tablet Commonly known as: Robaxin Take 1 tablet (500 mg total) by mouth every 8 (eight) hours as needed for muscle spasms. Caution of sedation   metoprolol tartrate 50 MG tablet Commonly known as: LOPRESSOR Take 1 tablet (50 mg total) by mouth 2 (two) times daily. What changed:  medication strength how much to take when to take this   nitroGLYCERIN 0.4 MG SL tablet Commonly known  as: NITROSTAT Place 1 tablet (0.4 mg total) under the tongue every 5 (five) minutes x 3 doses as needed for chest pain.   NovoLIN 70/30 Kwikpen (70-30) 100 UNIT/ML KwikPen Generic drug: insulin isophane & regular human KwikPen Inject 20 Units into the skin daily with breakfast. And pen needles 1/day   omeprazole 40 MG capsule Commonly known as: PRILOSEC TAKE 1 CAPUSLE BY MOUTH 30- 60 MIN BEFORE YOUR FIRST AND LAST MEALS OF THE DAY   ondansetron 8 MG tablet Commonly known as: Zofran Take 1 tablet (8 mg total) by mouth every 8 (eight) hours as needed for nausea or vomiting. From narcotic pain medicine   predniSONE 5 MG tablet Commonly known as: DELTASONE Take 2 tablets (10 mg total) by mouth in the morning and at bedtime. Resume after prednisone taper is completed What changed: additional instructions   predniSONE 10 MG tablet Commonly known as: DELTASONE Take prednisone 30 mg (3 tabs) for 3 days, then continue your maintenance dose of  prednisone What changed: You were already taking a medication with the same name, and this prescription was added. Make sure you understand how and when to take each.   rosuvastatin 40 MG tablet Commonly known as: CRESTOR Take 1 tablet (40 mg total) by mouth daily. Start taking on: September 25, 2021   Vitamin C Chew Chew 1 tablet by mouth daily.   VITAMIN D3 PO Take 1 capsule by mouth daily.   ZINC PO Take 1 tablet by mouth daily.        Follow-up Information     Tower, [name], MD. Schedule an appointment as soon as possible for a visit in 2 week(s).   Specialties: Family Medicine, Radiology Contact information: [place] [address] [phone]         Minna Merritts, MD. Schedule an appointment as soon as possible for a visit in 2 week(s).   Specialty: Cardiology Contact information: [place] [address] [phone]                 Discharge Exam: Danley Danker  Weights   09/20/21 5697 09/21/21 0311 09/22/21 0553  Weight: 103.4 kg 101.9 kg 102.4 kg   S: No acute complaints, overall improving, awaiting skilled nursing facility  BP 129/77 (BP Location: Left Arm)    Pulse 66    Temp 97.7 F (36.5 C) (Oral)    Resp 20    Ht 5\' 10"  (1.778 m)    Wt 102.4 kg    SpO2 95%    BMI 32.39 kg/m    Physical Exam General: Alert and oriented x 3, NAD Cardiovascular: S1 S2 clear, RRR. No pedal edema b/l Respiratory: CTAB, no wheezing, rales or rhonchi Gastrointestinal: Soft, nontender, nondistended, NBS Ext: no pedal edema bilaterally   Condition at discharge: fair  The results of significant diagnostics from this hospitalization (including imaging, microbiology, ancillary and laboratory) are listed below for reference.   Imaging Studies: DG Chest 1 View  Result Date: 09/17/2021 CLINICAL DATA:  Chest pressure and shortness of breath. EXAM: CHEST  1 VIEW COMPARISON:  Chest radiograph 08/26/2021, CT chest 12/19/2015 FINDINGS: Status post median sternotomy. Cardiac silhouette is again at the upper limits of normal size. Mild to moderate calcifications within the aortic arch. There are again calcified nodular densities within left lung apex, unchanged. These are consistent with the calcified pleural plaques seen on prior CT. No focal airspace opacity to indicate pneumonia. No pleural effusion or pneumothorax. No acute skeletal abnormality. IMPRESSION: No acute lung process.  Unchanged chronic findings as above. Electronically Signed   By: Yvonne Kendall   On: 09/17/2021 13:04   DG Chest 2 View  Result Date: 08/26/2021 CLINICAL DATA:  COVID infection. EXAM: CHEST - 2 VIEW COMPARISON:  December 11, 2020 FINDINGS: No pneumothorax. Calcified nodules in the lateral left apex, unchanged. The cardiomediastinal silhouette is stable. Intact sternotomy wires. No suspicious nodules or masses. No focal infiltrates. Tiny left effusion versus pleural thickening, unchanged.  IMPRESSION: No acute abnormalities or changes. Electronically Signed   By: Dorise Bullion III M.D.   On: 08/26/2021 13:57   NM Pulmonary Perfusion  Result Date: 09/17/2021 CLINICAL DATA:  Pulmonary embolism (PE) suspected, high prob. Shortness of breath, chest pain EXAM: NUCLEAR MEDICINE PERFUSION LUNG SCAN TECHNIQUE: Perfusion images were obtained in multiple projections after intravenous injection of radiopharmaceutical. Ventilation scans intentionally deferred if perfusion scan and chest x-ray adequate for interpretation during COVID 19 epidemic. RADIOPHARMACEUTICALS:  4.3 mCi Tc-31m MAA IV COMPARISON:  Chest x-ray today FINDINGS: No perfusion defects to suggest pulmonary embolus. IMPRESSION: No evidence of pulmonary embolus. Electronically Signed   By: Rolm Baptise M.D.   On: 09/17/2021 20:06   CARDIAC CATHETERIZATION  Result Date: 09/19/2021 CONCLUSIONS: Severe diffuse calcified coronary disease of the native arteries with 100% proximal RCA, 90% ostial circumflex, 99% distal left main, severely and diffusely diseased proximal to mid LAD with threatened septal perforator. Diffusely diseased saphenous vein graft to the right coronary.  Ostial to proximal 70% stenosis. Widely patent sequential graft to first and second obtuse marginal Widely patent saphenous vein graft to the first diagonal Widely patent LIMA to the mid LAD Aortic valve is heavily calcified, was difficult to cross, but without significant gradient.  LVEDP 10 mmHg. Total contrast 70 cc RECOMMENDATIONS: Continue IV fluids for 18 to 24 hours.  He was given an additional 250 cc of saline as a bolus after identifying that LVEDP was 9 mmHg. Monitor kidney function closely.  Contrast exposure was 70 cc. The saphenous vein graft to the right coronary can be stented although it is not so critical that it should be causing enzyme elevation.  Enzyme elevation likely related to left coronary native disease in the septal perforator territory.   DG  CHEST PORT 1 VIEW  Result Date: 09/20/2021 CLINICAL DATA:  Chest pain, shortness of breath, fatigue EXAM: PORTABLE CHEST 1 VIEW COMPARISON:  Chest radiograph 09/17/2021 FINDINGS: Median sternotomy wires are again noted. The cardiomediastinal silhouette is stable. There is no focal consolidation or pulmonary edema. There is no pleural effusion or pneumothorax. Calcified pleural plaques are again noted in the left apex. There is no acute osseous abnormality. IMPRESSION: Stable chest with no radiographic evidence of acute cardiopulmonary process. Electronically Signed   By: Valetta Mole M.D.   On: 09/20/2021 10:08   ECHOCARDIOGRAM COMPLETE  Result Date: 09/18/2021    ECHOCARDIOGRAM REPORT   Patient Name:   Kurt Aguilar [facility] Date of Exam: 09/18/2021 Medical Rec #:  [id number]        Height:       70.0 in Accession #:    [id number]       Weight:       225.3 lb Date of Birth:  1946-07-08        BSA:          2.196 m Patient Age:    83 years         BP:           165/90 mmHg Patient Gender: M                HR:           97 bpm. Exam Location:  Inpatient Procedure: 2D Echo, Cardiac Doppler, Color Doppler and Intracardiac            Opacification Agent Indications:    Acute myocardial infarction  History:        Patient has prior history of Echocardiogram examinations, most                 recent 05/22/2009. Prior CABG; Risk Factors:Diabetes and                 Hypertension. CKD. GERD.  Sonographer:    [name] Lefort RDCS (AE) Referring Phys: [phone] JARED M GARDNER  Sonographer Comments: Technically difficult study due to poor echo windows and no subcostal window. IMPRESSIONS  1. Left ventricular ejection fraction, by estimation,  is 60 to 65%. The left ventricle has normal function. The left ventricle has no regional wall motion abnormalities. Left ventricular diastolic parameters are consistent with Grade I diastolic dysfunction (impaired relaxation). Elevated left ventricular end-diastolic pressure.  2. Right ventricular  systolic function is moderately reduced. The right ventricular size is normal.  3. The mitral valve is normal in structure. No evidence of mitral valve regurgitation. No evidence of mitral stenosis.  4. The AV is poorly visualized to comment on structure. There does appear to be come calcification of the valve. . The aortic valve was not well visualized. Aortic valve regurgitation is not visualized. No aortic stenosis is present. FINDINGS  Left Ventricle: Left ventricular ejection fraction, by estimation, is 60 to 65%. The left ventricle has normal function. The left ventricle has no regional wall motion abnormalities. Definity contrast agent was given IV to delineate the left ventricular  endocardial borders. The left ventricular internal cavity size was normal in size. There is no left ventricular hypertrophy. Left ventricular diastolic parameters are consistent with Grade I diastolic dysfunction (impaired relaxation). Elevated left ventricular end-diastolic pressure. Right Ventricle: The right ventricular size is normal. No increase in right ventricular wall thickness. Right ventricular systolic function is moderately reduced. Left Atrium: Left atrial size was normal in size. Right Atrium: Right atrial size was normal in size. Pericardium: There is no evidence of pericardial effusion. Mitral Valve: The mitral valve is normal in structure. No evidence of mitral valve regurgitation. No evidence of mitral valve stenosis. Tricuspid Valve: The tricuspid valve is normal in structure. Tricuspid valve regurgitation is mild . No evidence of tricuspid stenosis. Aortic Valve: The AV is poorly visualized to comment on structure. There does appear to be come calcification of the valve. The aortic valve was not well visualized. Aortic valve regurgitation is not visualized. No aortic stenosis is present. Aortic valve mean gradient measures 3.0 mmHg. Aortic valve peak gradient measures 4.8 mmHg. Aortic valve area, by VTI  measures 2.47 cm. Pulmonic Valve: The pulmonic valve was normal in structure. Pulmonic valve regurgitation is not visualized. No evidence of pulmonic stenosis. Aorta: The aortic root is normal in size and structure. Venous: The inferior vena cava was not well visualized. IAS/Shunts: No atrial level shunt detected by color flow Doppler.  LEFT VENTRICLE PLAX 2D LVOT diam:     1.90 cm   Diastology LV SV:         52        LV e' medial:    5.66 cm/s LV SV Index:   24        LV E/e' medial:  14.8 LVOT Area:     2.84 cm  LV e' lateral:   6.09 cm/s                          LV E/e' lateral: 13.7  RIGHT VENTRICLE RV S prime:     10.30 cm/s TAPSE (M-mode): 1.3 cm LEFT ATRIUM             Index        RIGHT ATRIUM           Index LA Vol (A2C):   37.4 ml 17.03 ml/m  RA Area:     14.40 cm LA Vol (A4C):   69.8 ml 31.79 ml/m  RA Volume:   31.30 ml  14.25 ml/m LA Biplane Vol: 54.3 ml 24.73 ml/m  AORTIC VALVE AV Area (Vmax):    2.51  cm AV Area (Vmean):   2.75 cm AV Area (VTI):     2.47 cm AV Vmax:           110.00 cm/s AV Vmean:          77.800 cm/s AV VTI:            0.210 m AV Peak Grad:      4.8 mmHg AV Mean Grad:      3.0 mmHg LVOT Vmax:         97.50 cm/s LVOT Vmean:        75.400 cm/s LVOT VTI:          0.183 m LVOT/AV VTI ratio: 0.87  AORTA Ao Root diam: 2.70 cm Ao Asc diam:  2.90 cm MITRAL VALVE                TRICUSPID VALVE MV Area (PHT): 2.05 cm     TR Peak grad:   18.5 mmHg MV Decel Time: 370 msec     TR Vmax:        215.00 cm/s MV E velocity: 83.70 cm/s MV A velocity: 163.00 cm/s  SHUNTS MV E/A ratio:  0.51         Systemic VTI:  0.18 m                             Systemic Diam: 1.90 cm Fransico Him MD Electronically signed by Fransico Him MD Signature Date/Time: 09/18/2021/11:30:29 AM    Final    VAS [place] LOWER EXTREMITY VENOUS (DVT) (ONLY [facility] & [facility])  Result Date: 09/17/2021  Lower Venous DVT Study Patient Name:  MCADOO MUZQUIZ [facility]  Date of Exam:   09/17/2021 Medical Rec #: [id number]         Accession #:     [id number] Date of Birth: 12-14-1945         Patient Gender: M Patient Age:   25 years Exam Location:  [facility] Procedure:      VAS [place] LOWER EXTREMITY VENOUS (DVT) Referring Phys: BRITNI HENDERLY --------------------------------------------------------------------------------  Indications: Edema.  Comparison Study: No prior study Performing Technologist: Maudry Mayhew MHA, RDMS, RVT, RDCS  Examination Guidelines: A complete evaluation includes B-mode imaging, spectral Doppler, color Doppler, and power Doppler as needed of all accessible portions of each vessel. Bilateral testing is considered an integral part of a complete examination. Limited examinations for reoccurring indications may be performed as noted. The reflux portion of the exam is performed with the patient in reverse Trendelenburg.  +---------+---------------+---------+-----------+----------+--------------+  RIGHT     Compressibility Phasicity Spontaneity Properties Thrombus Aging  +---------+---------------+---------+-----------+----------+--------------+  CFV       Full            Yes       Yes                                    +---------+---------------+---------+-----------+----------+--------------+  SFJ       Full                                                             +---------+---------------+---------+-----------+----------+--------------+  FV Prox   Full                                                             +---------+---------------+---------+-----------+----------+--------------+  FV Mid    Full                                                             +---------+---------------+---------+-----------+----------+--------------+  FV Distal Full                                                             +---------+---------------+---------+-----------+----------+--------------+  PFV       Full                                                              +---------+---------------+---------+-----------+----------+--------------+  POP       Full            Yes       Yes                                    +---------+---------------+---------+-----------+----------+--------------+  PTV       Full                                                             +---------+---------------+---------+-----------+----------+--------------+  PERO      Full                                                             +---------+---------------+---------+-----------+----------+--------------+   +---------+---------------+---------+-----------+----------+--------------+  LEFT      Compressibility Phasicity Spontaneity Properties Thrombus Aging  +---------+---------------+---------+-----------+----------+--------------+  CFV       Full            Yes       Yes                                    +---------+---------------+---------+-----------+----------+--------------+  SFJ       Full                                                             +---------+---------------+---------+-----------+----------+--------------+  FV Prox   Full                                                             +---------+---------------+---------+-----------+----------+--------------+  FV Mid    Full                                                             +---------+---------------+---------+-----------+----------+--------------+  FV Distal Full                                                             +---------+---------------+---------+-----------+----------+--------------+  PFV       Full                                                             +---------+---------------+---------+-----------+----------+--------------+  POP       Full            Yes       Yes                                    +---------+---------------+---------+-----------+----------+--------------+  PTV       Full                                                              +---------+---------------+---------+-----------+----------+--------------+  PERO      Full                                                             +---------+---------------+---------+-----------+----------+--------------+     Summary: RIGHT: - There is no evidence of deep vein thrombosis in the lower extremity.  - No cystic structure found in the popliteal fossa.  LEFT: - There is no evidence of deep vein thrombosis in the lower extremity.  - No cystic structure found in the popliteal fossa.  *See table(s) above for measurements and observations. Electronically signed by Harold Barban MD on 09/17/2021 at 9:42:23 PM.    Final     Microbiology: Results for orders placed or performed during the hospital encounter of 09/17/21  Resp Panel by RT-PCR (Flu A&B, Covid) Nasopharyngeal Swab     Status: None   Collection Time: 09/17/21  5:09 PM   Specimen: Nasopharyngeal Swab; Nasopharyngeal(NP) swabs in vial transport medium  Result Value Ref Range Status   SARS Coronavirus 2 by RT PCR NEGATIVE NEGATIVE Final    Comment: (NOTE) SARS-CoV-2 target nucleic acids are NOT DETECTED.  The SARS-CoV-2 RNA is generally detectable in upper respiratory specimens during the acute phase of infection. The lowest concentration of SARS-CoV-2 viral copies this assay can detect is 138 copies/mL. A negative result does not  preclude SARS-Cov-2 infection and should not be used as the sole basis for treatment or other patient management decisions. A negative result may occur with  improper specimen collection/handling, submission of specimen other than nasopharyngeal swab, presence of viral mutation(s) within the areas targeted by this assay, and inadequate number of viral copies(<138 copies/mL). A negative result must be combined with clinical observations, patient history, and epidemiological information. The expected result is Negative.  Fact Sheet for Patients:  EntrepreneurPulse.com.au  Fact  Sheet for Healthcare Providers:  IncredibleEmployment.be  This test is no t yet approved or cleared by the [place] FDA and  has been authorized for detection and/or diagnosis of SARS-CoV-2 by FDA under an Emergency Use Authorization (EUA). This EUA will remain  in effect (meaning this test can be used) for the duration of the COVID-19 declaration under Section 564(b)(1) of the Act, 21 U.S.C.section 360bbb-3(b)(1), unless the authorization is terminated  or revoked sooner.       Influenza A by PCR NEGATIVE NEGATIVE Final   Influenza B by PCR NEGATIVE NEGATIVE Final    Comment: (NOTE) The Xpert Xpress SARS-CoV-2/FLU/RSV plus assay is intended as an aid in the diagnosis of influenza from Nasopharyngeal swab specimens and should not be used as a sole basis for treatment. Nasal washings and aspirates are unacceptable for Xpert Xpress SARS-CoV-2/FLU/RSV testing.  Fact Sheet for Patients: EntrepreneurPulse.com.au  Fact Sheet for Healthcare Providers: [employer]  This test is not yet approved or cleared by the [place] FDA and has been authorized for detection and/or diagnosis of SARS-CoV-2 by FDA under an Emergency Use Authorization (EUA). This EUA will remain in effect (meaning this test can be used) for the duration of the COVID-19 declaration under Section 564(b)(1) of the Act, 21 U.S.C. section 360bbb-3(b)(1), unless the authorization is terminated or revoked.  Performed at [facility], [place] [address] [place], [place] [address]   MRSA Next Gen by PCR, Nasal     Status: None   Collection Time: 09/18/21  3:35 AM   Specimen: Nasal Mucosa; Nasal Swab  Result Value Ref Range Status   MRSA by PCR Next Gen NOT DETECTED NOT DETECTED Final    Comment: (NOTE) The GeneXpert MRSA Assay (FDA approved for NASAL specimens only), is one component of a comprehensive MRSA colonization surveillance program.  It is not intended to diagnose MRSA infection nor to guide or monitor treatment for MRSA infections. Test performance is not FDA approved in patients less than 4 years old. Performed at [facility], [place] [address] [place], [place] [address]   Culture, blood (routine x 2)     Status: None   Collection Time: 09/18/21  6:06 AM   Specimen: BLOOD RIGHT FOREARM  Result Value Ref Range Status   Specimen Description BLOOD RIGHT FOREARM  Final   Special Requests   Final    BOTTLES DRAWN AEROBIC AND ANAEROBIC Blood Culture adequate volume   Culture   Final    NO GROWTH 5 DAYS Performed at [facility], [place] [address], [place], [place] [address]    Report Status 09/23/2021 FINAL  Final  Culture, blood (routine x 2)     Status: None   Collection Time: 09/18/21  6:11 AM   Specimen: BLOOD RIGHT HAND  Result Value Ref Range Status   Specimen Description BLOOD RIGHT HAND  Final   Special Requests   Final    BOTTLES DRAWN AEROBIC AND ANAEROBIC Blood Culture adequate volume   Culture  Final    NO GROWTH 5 DAYS Performed at [facility], [place] [address] [place], [place] [address]    Report Status 09/23/2021 FINAL  Final    Labs: CBC: Recent Labs  Lab 09/17/21 1247 09/17/21 1856 09/18/21 0606 09/19/21 0147 09/20/21 0100  WBC 13.7*  --  11.4* 11.0* 9.9  HGB 14.3 11.9* 13.2 12.2* 11.4*  HCT 44.0 35.0* 38.4* 38.1* 35.1*  MCV 100.5*  --  96.5 99.7 97.2  PLT 172  --  135* 127* 389*   Basic Metabolic Panel: Recent Labs  Lab 09/18/21 0606 09/19/21 0147 09/20/21 0100 09/21/21 0106 09/22/21 0126  NA 136 137 136 136 139  K 4.6 4.4 4.2 5.9* 4.2  CL 108 108 112* 106 110  CO2 18* 16* 18* 18* 20*  GLUCOSE 165* 204* 140* 198* 133*  BUN 50* 41* 36* 34* 33*  CREATININE 1.95* 2.15* 1.86* 2.01* 1.92*  CALCIUM 8.9 8.6* 8.1* 8.8* 9.0   Liver Function Tests: Recent Labs  Lab 09/17/21 2323  AST 25  ALT 36  ALKPHOS 33*  BILITOT 0.8  PROT 5.3*  ALBUMIN 3.0*    CBG: Recent Labs  Lab 09/23/21 1127 09/23/21 1653 09/23/21 2108 09/24/21 0610 09/24/21 1127  GLUCAP 288* 208* 253* 127* 169*    Discharge time spent: greater than 30 minutes.  Signed: Estill Cotta, MD [facility] 09/24/2021

## 2021-09-25 DIAGNOSIS — Z741 Need for assistance with personal care: Secondary | ICD-10-CM | POA: Diagnosis not present

## 2021-09-25 DIAGNOSIS — R918 Other nonspecific abnormal finding of lung field: Secondary | ICD-10-CM | POA: Diagnosis not present

## 2021-09-25 DIAGNOSIS — I129 Hypertensive chronic kidney disease with stage 1 through stage 4 chronic kidney disease, or unspecified chronic kidney disease: Secondary | ICD-10-CM | POA: Diagnosis not present

## 2021-09-25 DIAGNOSIS — J84113 Idiopathic non-specific interstitial pneumonitis: Secondary | ICD-10-CM | POA: Diagnosis not present

## 2021-09-25 DIAGNOSIS — R600 Localized edema: Secondary | ICD-10-CM | POA: Diagnosis not present

## 2021-09-25 DIAGNOSIS — D649 Anemia, unspecified: Secondary | ICD-10-CM | POA: Diagnosis not present

## 2021-09-25 DIAGNOSIS — E1159 Type 2 diabetes mellitus with other circulatory complications: Secondary | ICD-10-CM | POA: Diagnosis not present

## 2021-09-25 DIAGNOSIS — E1122 Type 2 diabetes mellitus with diabetic chronic kidney disease: Secondary | ICD-10-CM | POA: Diagnosis not present

## 2021-09-25 DIAGNOSIS — E875 Hyperkalemia: Secondary | ICD-10-CM | POA: Diagnosis not present

## 2021-09-25 DIAGNOSIS — Z951 Presence of aortocoronary bypass graft: Secondary | ICD-10-CM | POA: Diagnosis not present

## 2021-09-25 DIAGNOSIS — H04129 Dry eye syndrome of unspecified lacrimal gland: Secondary | ICD-10-CM | POA: Diagnosis not present

## 2021-09-25 DIAGNOSIS — M6281 Muscle weakness (generalized): Secondary | ICD-10-CM | POA: Diagnosis not present

## 2021-09-25 DIAGNOSIS — E785 Hyperlipidemia, unspecified: Secondary | ICD-10-CM | POA: Diagnosis not present

## 2021-09-25 DIAGNOSIS — I249 Acute ischemic heart disease, unspecified: Secondary | ICD-10-CM | POA: Diagnosis not present

## 2021-09-25 DIAGNOSIS — H02889 Meibomian gland dysfunction of unspecified eye, unspecified eyelid: Secondary | ICD-10-CM | POA: Diagnosis not present

## 2021-09-25 DIAGNOSIS — R278 Other lack of coordination: Secondary | ICD-10-CM | POA: Diagnosis not present

## 2021-09-25 DIAGNOSIS — Q612 Polycystic kidney, adult type: Secondary | ICD-10-CM | POA: Diagnosis not present

## 2021-09-25 DIAGNOSIS — I214 Non-ST elevation (NSTEMI) myocardial infarction: Secondary | ICD-10-CM | POA: Diagnosis not present

## 2021-09-25 DIAGNOSIS — I4510 Unspecified right bundle-branch block: Secondary | ICD-10-CM | POA: Diagnosis not present

## 2021-09-25 DIAGNOSIS — K219 Gastro-esophageal reflux disease without esophagitis: Secondary | ICD-10-CM | POA: Diagnosis not present

## 2021-09-25 DIAGNOSIS — I10 Essential (primary) hypertension: Secondary | ICD-10-CM | POA: Diagnosis not present

## 2021-09-25 DIAGNOSIS — N179 Acute kidney failure, unspecified: Secondary | ICD-10-CM | POA: Diagnosis not present

## 2021-09-25 DIAGNOSIS — I25119 Atherosclerotic heart disease of native coronary artery with unspecified angina pectoris: Secondary | ICD-10-CM | POA: Diagnosis not present

## 2021-09-25 DIAGNOSIS — R0602 Shortness of breath: Secondary | ICD-10-CM | POA: Diagnosis not present

## 2021-09-25 DIAGNOSIS — R739 Hyperglycemia, unspecified: Secondary | ICD-10-CM | POA: Diagnosis not present

## 2021-09-25 DIAGNOSIS — I222 Subsequent non-ST elevation (NSTEMI) myocardial infarction: Secondary | ICD-10-CM | POA: Diagnosis not present

## 2021-09-25 DIAGNOSIS — E782 Mixed hyperlipidemia: Secondary | ICD-10-CM | POA: Diagnosis not present

## 2021-09-25 DIAGNOSIS — E119 Type 2 diabetes mellitus without complications: Secondary | ICD-10-CM | POA: Diagnosis not present

## 2021-09-25 DIAGNOSIS — R2681 Unsteadiness on feet: Secondary | ICD-10-CM | POA: Diagnosis not present

## 2021-09-25 DIAGNOSIS — I5032 Chronic diastolic (congestive) heart failure: Secondary | ICD-10-CM | POA: Diagnosis not present

## 2021-09-25 DIAGNOSIS — J8489 Other specified interstitial pulmonary diseases: Secondary | ICD-10-CM | POA: Diagnosis not present

## 2021-09-25 DIAGNOSIS — I2510 Atherosclerotic heart disease of native coronary artery without angina pectoris: Secondary | ICD-10-CM | POA: Diagnosis not present

## 2021-09-25 DIAGNOSIS — R809 Proteinuria, unspecified: Secondary | ICD-10-CM | POA: Diagnosis not present

## 2021-09-25 DIAGNOSIS — N183 Chronic kidney disease, stage 3 unspecified: Secondary | ICD-10-CM | POA: Diagnosis not present

## 2021-09-25 DIAGNOSIS — E1165 Type 2 diabetes mellitus with hyperglycemia: Secondary | ICD-10-CM | POA: Diagnosis not present

## 2021-09-25 DIAGNOSIS — I25708 Atherosclerosis of coronary artery bypass graft(s), unspecified, with other forms of angina pectoris: Secondary | ICD-10-CM | POA: Diagnosis not present

## 2021-09-25 DIAGNOSIS — M109 Gout, unspecified: Secondary | ICD-10-CM | POA: Diagnosis not present

## 2021-09-25 DIAGNOSIS — N189 Chronic kidney disease, unspecified: Secondary | ICD-10-CM | POA: Diagnosis not present

## 2021-09-25 DIAGNOSIS — H353131 Nonexudative age-related macular degeneration, bilateral, early dry stage: Secondary | ICD-10-CM | POA: Diagnosis not present

## 2021-09-25 DIAGNOSIS — E1129 Type 2 diabetes mellitus with other diabetic kidney complication: Secondary | ICD-10-CM | POA: Diagnosis not present

## 2021-09-25 DIAGNOSIS — R058 Other specified cough: Secondary | ICD-10-CM | POA: Diagnosis not present

## 2021-09-25 DIAGNOSIS — N1832 Chronic kidney disease, stage 3b: Secondary | ICD-10-CM | POA: Diagnosis not present

## 2021-09-25 LAB — GLUCOSE, CAPILLARY
Glucose-Capillary: 152 mg/dL — ABNORMAL HIGH (ref 70–99)
Glucose-Capillary: 183 mg/dL — ABNORMAL HIGH (ref 70–99)

## 2021-09-25 NOTE — TOC Transition Note (Incomplete)
Transition of Care [facility]) - CM/SW Discharge Note   Patient Details  Name: Kurt Aguilar MRN: [id number] Date of Birth: 1946/03/30  Transition of Care [facility]) CM/SW Contact:  Tresa Endo Phone Number: 09/25/2021, 11:25 AM   Clinical Narrative:    Patient will DC to: [facility] Anticipated DC date: 09/25/2021 Family notified: Pt Spouse Transport by: Pt Spouse   Per MD patient ready for DC to [facility]. RN to call report prior to discharge [phone]). RN, patient, patient's family, and facility notified of DC. Discharge Summary and FL2 sent to facility. DC packet on chart.   CSW will sign off for now as social work intervention is no longer needed. Please consult [place] again if new needs arise.       Barriers to Discharge: Ship broker, SNF Pending bed offer, Continued Medical Work up   Patient Goals and CMS Choice Patient states their goals for this hospitalization and ongoing recovery are:: Wants to be able to go back home CMS Medicare.gov Compare Post Acute Care list provided to:: Patient Choice offered to / list presented to : Patient  Discharge Placement                       Discharge Plan and Services In-house Referral: NA Discharge Planning Services: NA Post Acute Care Choice: NA          DME Arranged: N/A DME Agency: NA       HH Arranged: NA HH Agency: NA        Social Determinants of Health (SDOH) Interventions     Readmission Risk Interventions Readmission Risk Prevention Plan 09/20/2021  Transportation Screening Complete  Medication Review [employer] photographer) Referral to Pharmacy  PCP or Specialist appointment within 3-5 days of discharge Not Complete  PCP/Specialist Appt Not Complete comments pending patient is not medically stable but does have a PCP  [place] or [place] Complete  SW Recovery Care/Counseling Consult Complete  Palliative Care Screening Not [place] Not Applicable   Some recent data might be hidden

## 2021-09-25 NOTE — Discharge Summary (Signed)
Physician Discharge Summary   Patient: Kurt Aguilar MRN: [id number] DOB: 22-Jan-1946  Admit date:     09/17/2021  Discharge date: 09/25/21  Discharge Physician: Annita Brod   PCP: [name], MD   Recommendations at discharge:  Patient to be discharged to skilled nursing 1/18 Patient will discharge on prednisone 10mg  po bid   Discharge Diagnoses    Acute coronary syndrome / NSTEMI(HCC) Acute kidney injury on CKD stage IIIb Lactic acidosis Shortness of breath with coughing   Essential hypertension   Right bundle branch block   CAD (coronary artery disease)   DM2 (diabetes mellitus, type 2) (HCC)   Steroid dependent ([place])   AKI (acute kidney injury) ([place])   Stage 3b chronic kidney disease (CKD) [facility])     Hospital Course     Patient is a 76 year old male with history of CAD status post CABG in 1997, DM2, HTN, HLD, Boop, recent COVID-19 infection in December 2022, chronic steroid dependency presented to ED with mild intermittent chest pain for a month, worse with exertion, dyspnea.  Patient was found to have elevated troponins and was admitted for further work-up    NSTEMI [facility]) with known history of CAD status post CABG x5 in 1997 -2D echo showed no wall motion abnormality, normal EF, likely demand ischemia -Left heart cardiac cath showed multivessel native coronary artery diseasesaphenous vein to right coronary artery with 70% ostial to proximal stenosis, SVG to OM 1 and 2 patent, SVG to first diagonal patent, LIMA to LAD patent.  -Continue aspirin, beta-blocker, Crestor, Zetia,      Essential hypertension -BP stable, continue beta-blocker    Hyperkalemia -Resolved   Acute on chronic CKD stage IIIb, lactic acidosis -Creatinine 2.34 on admission, baseline around 1.8-1.9 -Creatinine improved, close to baseline.  Completed IV fluid hydration.      Shortness of breath with cough -Per patient 4 weeks, dry persistent cough, possibly due to COVID-19 in December  (positive on 12/4), underlying history of BOOP -CXR with no pneumonia or effusion.  VQ scan negative for PE.  Normal BNP -Continues to feel winded with exertion, coughing spasms. -Continue prednisone 40 mg daily with a taper upon discharge. (Outpatient on 10 mg BID) -Continue scheduled nebs, incentive spirometry, Tessalon Perles  -Home O2 evaluation done on 1/15, did not meet criteria for home O2   Hyperlipidemia -LDL 74 in 04/2021.  Continue Crestor 40 mg daily, Zetia 10 mg daily   Diabetes mellitus type 2, on long-term insulin with renal complications -Hemoglobin A1c 9.5 on 09/13/2021 --Continue insulin regimen, hopefully CBGs will continue to improve once prednisone taper is completed and back to maintenance dose   BOOP - follows Dr [name] outpatient, continue prednisone     Obesity Estimated body mass index is 32.39 kg/m as calculated from the following:   Height as of this encounter: 5\' 10"  (1.778 m).   Weight as of this encounter: 102.4 kg.   Pain control - [employer] Controlled Substance Reporting System database was reviewed. and patient was instructed, not to drive, operate heavy machinery, perform activities at heights, swimming or participation in water activities or provide baby-sitting services while on Pain, Sleep and Anxiety Medications; until their outpatient Physician has advised to do so again. Also recommended to not to take more than prescribed Pain, Sleep and Anxiety Medications.   Consultants:  Cardiology Procedures performed: Cardiac cath Disposition: Skilled nursing facility Diet recommendation: Cardiac and Carb modified diet  DISCHARGE MEDICATION: Allergies as of 09/25/2021  Reactions   Oxycodone-acetaminophen Other (See Comments)   Stops breathing; has tolerated Tylenol before        Medication List     STOP taking these medications    Mucinex DM Maximum Strength 60-1200 MG Tb12       TAKE these medications    albuterol 108 (90 Base)  MCG/ACT inhaler Commonly known as: VENTOLIN HFA Inhale 2 puffs into the lungs every 6 (six) hours as needed for wheezing or shortness of breath.   allopurinol 100 MG tablet Commonly known as: ZYLOPRIM Take 1 tablet (100 mg total) by mouth 2 (two) times daily. What changed: additional instructions   aspirin 81 MG tablet Take 1 tablet (81 mg total) by mouth 2 (two) times daily after a meal.   benzonatate 200 MG capsule Commonly known as: TESSALON Take 1 capsule (200 mg total) by mouth 3 (three) times daily as needed for cough.   CINNAMON PO Take 1,000 mg by mouth 2 (two) times daily.   colchicine 0.6 MG tablet TAKE 1 TABLET BY MOUTH TWICE A DAY WITH A MEAL AS NEEDED FOR ACUTE FLARE OF GOUT   ezetimibe 10 MG tablet Commonly known as: ZETIA Take 1 tablet (10 mg total) by mouth daily.   famotidine 20 MG tablet Commonly known as: PEPCID TAKE 1 TABLET BY MOUTH EVERYDAY AT BEDTIME   freestyle lancets 1 EACH BY OTHER ROUTE 4 (FOUR) TIMES DAILY. E11.65   FreeStyle Lite Devi 1 each by Does not apply route 4 (four) times daily. E11.65   FREESTYLE LITE test strip Generic drug: glucose blood 1 each by Other route 4 (four) times daily. E11.65   GLUCOSAMINE PO Take 1,500 mg by mouth 2 (two) times daily.   HYDROcodone bit-homatropine 5-1.5 MG/5ML syrup Commonly known as: HYCODAN Take 5 mLs by mouth every 6 (six) hours as needed for cough.   Invokana 100 MG Tabs tablet Generic drug: canagliflozin TAKE 1 TABLET BY MOUTH EVERY OTHER DAY   LUBRICATING EYE DROPS OP Place 1 drop into both eyes daily as needed (dry eyes).   Lutein 20 MG Caps Take 20 mg by mouth daily.   methocarbamol 500 MG tablet Commonly known as: Robaxin Take 1 tablet (500 mg total) by mouth every 8 (eight) hours as needed for muscle spasms. Caution of sedation   metoprolol tartrate 50 MG tablet Commonly known as: LOPRESSOR Take 1 tablet (50 mg total) by mouth 2 (two) times daily. What changed:  medication  strength how much to take when to take this   nitroGLYCERIN 0.4 MG SL tablet Commonly known as: NITROSTAT Place 1 tablet (0.4 mg total) under the tongue every 5 (five) minutes x 3 doses as needed for chest pain.   NovoLIN 70/30 Kwikpen (70-30) 100 UNIT/ML KwikPen Generic drug: insulin isophane & regular human KwikPen Inject 20 Units into the skin daily with breakfast. And pen needles 1/day   omeprazole 40 MG capsule Commonly known as: PRILOSEC TAKE 1 CAPUSLE BY MOUTH 30- 60 MIN BEFORE YOUR FIRST AND LAST MEALS OF THE DAY   ondansetron 8 MG tablet Commonly known as: Zofran Take 1 tablet (8 mg total) by mouth every 8 (eight) hours as needed for nausea or vomiting. From narcotic pain medicine   predniSONE 5 MG tablet Commonly known as: DELTASONE Take 2 tablets (10 mg total) by mouth in the morning and at bedtime. Resume after prednisone taper is completed What changed: additional instructions   predniSONE 10 MG tablet Commonly known as: DELTASONE Take prednisone  30 mg (3 tabs) for 3 days, then continue your maintenance dose of prednisone What changed: You were already taking a medication with the same name, and this prescription was added. Make sure you understand how and when to take each.   rosuvastatin 40 MG tablet Commonly known as: CRESTOR Take 1 tablet (40 mg total) by mouth daily.   Vitamin C Chew Chew 1 tablet by mouth daily.   VITAMIN D3 PO Take 1 capsule by mouth daily.   ZINC PO Take 1 tablet by mouth daily.        Follow-up Information     Tower, [name], MD. Schedule an appointment as soon as possible for a visit in 2 week(s).   Specialties: Family Medicine, Radiology Contact information: [place] [address] [phone]         Minna Merritts, MD. Schedule an appointment as soon as possible for a visit in 2 week(s).   Specialty: Cardiology Contact information: [place]  [address] [phone]                 Discharge Exam: Danley Danker Weights   09/20/21 0350 09/21/21 0311 09/22/21 0553  Weight: 103.4 kg 101.9 kg 102.4 kg   S: No acute complaints, overall improving, awaiting skilled nursing facility  BP 140/86 (BP Location: Left Arm)    Pulse 92    Temp 97.6 F (36.4 C)    Resp 18    Ht 5\' 10"  (1.778 m)    Wt 102.4 kg    SpO2 96%    BMI 32.39 kg/m    Physical Exam General: Alert and oriented x 3, NAD Cardiovascular: S1 S2 clear, RRR. No pedal edema b/l Respiratory: CTAB, no wheezing, rales or rhonchi Gastrointestinal: Soft, nontender, nondistended, NBS Ext: no pedal edema bilaterally   Condition at discharge: fair  The results of significant diagnostics from this hospitalization (including imaging, microbiology, ancillary and laboratory) are listed below for reference.   Imaging Studies: DG Chest 1 View  Result Date: 09/17/2021 CLINICAL DATA:  Chest pressure and shortness of breath. EXAM: CHEST  1 VIEW COMPARISON:  Chest radiograph 08/26/2021, CT chest 12/19/2015 FINDINGS: Status post median sternotomy. Cardiac silhouette is again at the upper limits of normal size. Mild to moderate calcifications within the aortic arch. There are again calcified nodular densities within left lung apex, unchanged. These are consistent with the calcified pleural plaques seen on prior CT. No focal airspace opacity to indicate pneumonia. No pleural effusion or pneumothorax. No acute skeletal abnormality. IMPRESSION: No acute lung process.  Unchanged chronic findings as above. Electronically Signed   By: Yvonne Kendall   On: 09/17/2021 13:04   DG Chest 2 View  Result Date: 08/26/2021 CLINICAL DATA:  COVID infection. EXAM: CHEST - 2 VIEW COMPARISON:  December 11, 2020 FINDINGS: No pneumothorax. Calcified nodules in the lateral left apex, unchanged. The cardiomediastinal silhouette is stable. Intact sternotomy wires. No suspicious nodules or masses. No focal infiltrates.  Tiny left effusion versus pleural thickening, unchanged. IMPRESSION: No acute abnormalities or changes. Electronically Signed   By: Dorise Bullion III M.D.   On: 08/26/2021 13:57   NM Pulmonary Perfusion  Result Date: 09/17/2021 CLINICAL DATA:  Pulmonary embolism (PE) suspected, high prob. Shortness of breath, chest pain EXAM: NUCLEAR MEDICINE PERFUSION LUNG SCAN TECHNIQUE: Perfusion images were obtained in multiple projections after intravenous injection of radiopharmaceutical. Ventilation scans intentionally deferred if perfusion scan and chest x-ray adequate for interpretation  during Myton 19 epidemic. RADIOPHARMACEUTICALS:  4.3 mCi Tc-63m MAA IV COMPARISON:  Chest x-ray today FINDINGS: No perfusion defects to suggest pulmonary embolus. IMPRESSION: No evidence of pulmonary embolus. Electronically Signed   By: Rolm Baptise M.D.   On: 09/17/2021 20:06   CARDIAC CATHETERIZATION  Result Date: 09/19/2021 CONCLUSIONS: Severe diffuse calcified coronary disease of the native arteries with 100% proximal RCA, 90% ostial circumflex, 99% distal left main, severely and diffusely diseased proximal to mid LAD with threatened septal perforator. Diffusely diseased saphenous vein graft to the right coronary.  Ostial to proximal 70% stenosis. Widely patent sequential graft to first and second obtuse marginal Widely patent saphenous vein graft to the first diagonal Widely patent LIMA to the mid LAD Aortic valve is heavily calcified, was difficult to cross, but without significant gradient.  LVEDP 10 mmHg. Total contrast 70 cc RECOMMENDATIONS: Continue IV fluids for 18 to 24 hours.  He was given an additional 250 cc of saline as a bolus after identifying that LVEDP was 9 mmHg. Monitor kidney function closely.  Contrast exposure was 70 cc. The saphenous vein graft to the right coronary can be stented although it is not so critical that it should be causing enzyme elevation.  Enzyme elevation likely related to left coronary  native disease in the septal perforator territory.   DG CHEST PORT 1 VIEW  Result Date: 09/20/2021 CLINICAL DATA:  Chest pain, shortness of breath, fatigue EXAM: PORTABLE CHEST 1 VIEW COMPARISON:  Chest radiograph 09/17/2021 FINDINGS: Median sternotomy wires are again noted. The cardiomediastinal silhouette is stable. There is no focal consolidation or pulmonary edema. There is no pleural effusion or pneumothorax. Calcified pleural plaques are again noted in the left apex. There is no acute osseous abnormality. IMPRESSION: Stable chest with no radiographic evidence of acute cardiopulmonary process. Electronically Signed   By: Valetta Mole M.D.   On: 09/20/2021 10:08   ECHOCARDIOGRAM COMPLETE  Result Date: 09/18/2021    ECHOCARDIOGRAM REPORT   Patient Name:   AVYAY COGER [facility] Date of Exam: 09/18/2021 Medical Rec #:  [id number]        Height:       70.0 in Accession #:    [id number]       Weight:       225.3 lb Date of Birth:  1945/12/08        BSA:          2.196 m Patient Age:    38 years         BP:           165/90 mmHg Patient Gender: M                HR:           97 bpm. Exam Location:  Inpatient Procedure: 2D Echo, Cardiac Doppler, Color Doppler and Intracardiac            Opacification Agent Indications:    Acute myocardial infarction  History:        Patient has prior history of Echocardiogram examinations, most                 recent 05/22/2009. Prior CABG; Risk Factors:Diabetes and                 Hypertension. CKD. GERD.  Sonographer:    [name] Lefort RDCS (AE) Referring Phys: [phone] JARED M GARDNER  Sonographer Comments: Technically difficult study due to poor echo windows and no subcostal window. IMPRESSIONS  1.  Left ventricular ejection fraction, by estimation, is 60 to 65%. The left ventricle has normal function. The left ventricle has no regional wall motion abnormalities. Left ventricular diastolic parameters are consistent with Grade I diastolic dysfunction (impaired relaxation). Elevated left  ventricular end-diastolic pressure.  2. Right ventricular systolic function is moderately reduced. The right ventricular size is normal.  3. The mitral valve is normal in structure. No evidence of mitral valve regurgitation. No evidence of mitral stenosis.  4. The AV is poorly visualized to comment on structure. There does appear to be come calcification of the valve. . The aortic valve was not well visualized. Aortic valve regurgitation is not visualized. No aortic stenosis is present. FINDINGS  Left Ventricle: Left ventricular ejection fraction, by estimation, is 60 to 65%. The left ventricle has normal function. The left ventricle has no regional wall motion abnormalities. Definity contrast agent was given IV to delineate the left ventricular  endocardial borders. The left ventricular internal cavity size was normal in size. There is no left ventricular hypertrophy. Left ventricular diastolic parameters are consistent with Grade I diastolic dysfunction (impaired relaxation). Elevated left ventricular end-diastolic pressure. Right Ventricle: The right ventricular size is normal. No increase in right ventricular wall thickness. Right ventricular systolic function is moderately reduced. Left Atrium: Left atrial size was normal in size. Right Atrium: Right atrial size was normal in size. Pericardium: There is no evidence of pericardial effusion. Mitral Valve: The mitral valve is normal in structure. No evidence of mitral valve regurgitation. No evidence of mitral valve stenosis. Tricuspid Valve: The tricuspid valve is normal in structure. Tricuspid valve regurgitation is mild . No evidence of tricuspid stenosis. Aortic Valve: The AV is poorly visualized to comment on structure. There does appear to be come calcification of the valve. The aortic valve was not well visualized. Aortic valve regurgitation is not visualized. No aortic stenosis is present. Aortic valve mean gradient measures 3.0 mmHg. Aortic valve peak  gradient measures 4.8 mmHg. Aortic valve area, by VTI measures 2.47 cm. Pulmonic Valve: The pulmonic valve was normal in structure. Pulmonic valve regurgitation is not visualized. No evidence of pulmonic stenosis. Aorta: The aortic root is normal in size and structure. Venous: The inferior vena cava was not well visualized. IAS/Shunts: No atrial level shunt detected by color flow Doppler.  LEFT VENTRICLE PLAX 2D LVOT diam:     1.90 cm   Diastology LV SV:         52        LV e' medial:    5.66 cm/s LV SV Index:   24        LV E/e' medial:  14.8 LVOT Area:     2.84 cm  LV e' lateral:   6.09 cm/s                          LV E/e' lateral: 13.7  RIGHT VENTRICLE RV S prime:     10.30 cm/s TAPSE (M-mode): 1.3 cm LEFT ATRIUM             Index        RIGHT ATRIUM           Index LA Vol (A2C):   37.4 ml 17.03 ml/m  RA Area:     14.40 cm LA Vol (A4C):   69.8 ml 31.79 ml/m  RA Volume:   31.30 ml  14.25 ml/m LA Biplane Vol: 54.3 ml 24.73 ml/m  AORTIC VALVE AV  Area (Vmax):    2.51 cm AV Area (Vmean):   2.75 cm AV Area (VTI):     2.47 cm AV Vmax:           110.00 cm/s AV Vmean:          77.800 cm/s AV VTI:            0.210 m AV Peak Grad:      4.8 mmHg AV Mean Grad:      3.0 mmHg LVOT Vmax:         97.50 cm/s LVOT Vmean:        75.400 cm/s LVOT VTI:          0.183 m LVOT/AV VTI ratio: 0.87  AORTA Ao Root diam: 2.70 cm Ao Asc diam:  2.90 cm MITRAL VALVE                TRICUSPID VALVE MV Area (PHT): 2.05 cm     TR Peak grad:   18.5 mmHg MV Decel Time: 370 msec     TR Vmax:        215.00 cm/s MV E velocity: 83.70 cm/s MV A velocity: 163.00 cm/s  SHUNTS MV E/A ratio:  0.51         Systemic VTI:  0.18 m                             Systemic Diam: 1.90 cm Fransico Him MD Electronically signed by Fransico Him MD Signature Date/Time: 09/18/2021/11:30:29 AM    Final    VAS [place] LOWER EXTREMITY VENOUS (DVT) (ONLY [facility] & [facility])  Result Date: 09/17/2021  Lower Venous DVT Study Patient Name:  CHAMPION CORALES [facility]  Date of Exam:   09/17/2021  Medical Rec #: [id number]         Accession #:    [id number] Date of Birth: [birth year]         Patient Gender: M Patient Age:   74 years Exam Location:  [facility] Procedure:      VAS [place] LOWER EXTREMITY VENOUS (DVT) Referring Phys: BRITNI HENDERLY --------------------------------------------------------------------------------  Indications: Edema.  Comparison Study: No prior study Performing Technologist: Maudry Mayhew MHA, RDMS, RVT, RDCS  Examination Guidelines: A complete evaluation includes B-mode imaging, spectral Doppler, color Doppler, and power Doppler as needed of all accessible portions of each vessel. Bilateral testing is considered an integral part of a complete examination. Limited examinations for reoccurring indications may be performed as noted. The reflux portion of the exam is performed with the patient in reverse Trendelenburg.  +---------+---------------+---------+-----------+----------+--------------+  RIGHT     Compressibility Phasicity Spontaneity Properties Thrombus Aging  +---------+---------------+---------+-----------+----------+--------------+  CFV       Full            Yes       Yes                                    +---------+---------------+---------+-----------+----------+--------------+  SFJ       Full                                                             +---------+---------------+---------+-----------+----------+--------------+  FV Prox  Full                                                             +---------+---------------+---------+-----------+----------+--------------+  FV Mid    Full                                                             +---------+---------------+---------+-----------+----------+--------------+  FV Distal Full                                                             +---------+---------------+---------+-----------+----------+--------------+  PFV       Full                                                              +---------+---------------+---------+-----------+----------+--------------+  POP       Full            Yes       Yes                                    +---------+---------------+---------+-----------+----------+--------------+  PTV       Full                                                             +---------+---------------+---------+-----------+----------+--------------+  PERO      Full                                                             +---------+---------------+---------+-----------+----------+--------------+   +---------+---------------+---------+-----------+----------+--------------+  LEFT      Compressibility Phasicity Spontaneity Properties Thrombus Aging  +---------+---------------+---------+-----------+----------+--------------+  CFV       Full            Yes       Yes                                    +---------+---------------+---------+-----------+----------+--------------+  SFJ       Full                                                             +---------+---------------+---------+-----------+----------+--------------+  FV Prox   Full                                                             +---------+---------------+---------+-----------+----------+--------------+  FV Mid    Full                                                             +---------+---------------+---------+-----------+----------+--------------+  FV Distal Full                                                             +---------+---------------+---------+-----------+----------+--------------+  PFV       Full                                                             +---------+---------------+---------+-----------+----------+--------------+  POP       Full            Yes       Yes                                    +---------+---------------+---------+-----------+----------+--------------+  PTV       Full                                                              +---------+---------------+---------+-----------+----------+--------------+  PERO      Full                                                             +---------+---------------+---------+-----------+----------+--------------+     Summary: RIGHT: - There is no evidence of deep vein thrombosis in the lower extremity.  - No cystic structure found in the popliteal fossa.  LEFT: - There is no evidence of deep vein thrombosis in the lower extremity.  - No cystic structure found in the popliteal fossa.  *See table(s) above for measurements and observations. Electronically signed by Harold Barban MD on 09/17/2021 at 9:42:23 PM.    Final     Microbiology: Results for orders placed or performed during the hospital encounter of 09/17/21  Resp Panel by RT-PCR (Flu A&B, Covid) Nasopharyngeal Swab     Status: None   Collection Time: 09/17/21  5:09 PM   Specimen: Nasopharyngeal Swab; Nasopharyngeal(NP) swabs in vial  transport medium  Result Value Ref Range Status   SARS Coronavirus 2 by RT PCR NEGATIVE NEGATIVE Final    Comment: (NOTE) SARS-CoV-2 target nucleic acids are NOT DETECTED.  The SARS-CoV-2 RNA is generally detectable in upper respiratory specimens during the acute phase of infection. The lowest concentration of SARS-CoV-2 viral copies this assay can detect is 138 copies/mL. A negative result does not preclude SARS-Cov-2 infection and should not be used as the sole basis for treatment or other patient management decisions. A negative result may occur with  improper specimen collection/handling, submission of specimen other than nasopharyngeal swab, presence of viral mutation(s) within the areas targeted by this assay, and inadequate number of viral copies(<138 copies/mL). A negative result must be combined with clinical observations, patient history, and epidemiological information. The expected result is Negative.  Fact Sheet for Patients:  EntrepreneurPulse.com.au  Fact  Sheet for Healthcare Providers:  IncredibleEmployment.be  This test is no t yet approved or cleared by the [place] FDA and  has been authorized for detection and/or diagnosis of SARS-CoV-2 by FDA under an Emergency Use Authorization (EUA). This EUA will remain  in effect (meaning this test can be used) for the duration of the COVID-19 declaration under Section 564(b)(1) of the Act, 21 U.S.C.section 360bbb-3(b)(1), unless the authorization is terminated  or revoked sooner.       Influenza A by PCR NEGATIVE NEGATIVE Final   Influenza B by PCR NEGATIVE NEGATIVE Final    Comment: (NOTE) The Xpert Xpress SARS-CoV-2/FLU/RSV plus assay is intended as an aid in the diagnosis of influenza from Nasopharyngeal swab specimens and should not be used as a sole basis for treatment. Nasal washings and aspirates are unacceptable for Xpert Xpress SARS-CoV-2/FLU/RSV testing.  Fact Sheet for Patients: EntrepreneurPulse.com.au  Fact Sheet for Healthcare Providers: IncredibleEmployment.be  This test is not yet approved or cleared by the [place] FDA and has been authorized for detection and/or diagnosis of SARS-CoV-2 by FDA under an Emergency Use Authorization (EUA). This EUA will remain in effect (meaning this test can be used) for the duration of the COVID-19 declaration under Section 564(b)(1) of the Act, 21 U.S.C. section 360bbb-3(b)(1), unless the authorization is terminated or revoked.  Performed at [facility], [place] [address], [place], [place] [address]   MRSA Next Gen by PCR, Nasal     Status: None   Collection Time: 09/18/21  3:35 AM   Specimen: Nasal Mucosa; Nasal Swab  Result Value Ref Range Status   MRSA by PCR Next Gen NOT DETECTED NOT DETECTED Final    Comment: (NOTE) The GeneXpert MRSA Assay (FDA approved for NASAL specimens only), is one component of a comprehensive MRSA colonization surveillance program.  It is not intended to diagnose MRSA infection nor to guide or monitor treatment for MRSA infections. Test performance is not FDA approved in patients less than 49 years old. Performed at [facility], [place] [address] [place], [place] [address]   Culture, blood (routine x 2)     Status: None   Collection Time: 09/18/21  6:06 AM   Specimen: BLOOD RIGHT FOREARM  Result Value Ref Range Status   Specimen Description BLOOD RIGHT FOREARM  Final   Special Requests   Final    BOTTLES DRAWN AEROBIC AND ANAEROBIC Blood Culture adequate volume   Culture   Final    NO GROWTH 5 DAYS Performed at [facility], [place] [address], [place], [place] [address]    Report Status  09/23/2021 FINAL  Final  Culture, blood (routine x 2)     Status: None   Collection Time: 09/18/21  6:11 AM   Specimen: BLOOD RIGHT HAND  Result Value Ref Range Status   Specimen Description BLOOD RIGHT HAND  Final   Special Requests   Final    BOTTLES DRAWN AEROBIC AND ANAEROBIC Blood Culture adequate volume   Culture   Final    NO GROWTH 5 DAYS Performed at [facility], [place] [address], [place], [place] [address]    Report Status 09/23/2021 FINAL  Final  Resp Panel by RT-PCR (Flu A&B, Covid) Nasopharyngeal Swab     Status: None   Collection Time: 09/24/21 11:18 AM   Specimen: Nasopharyngeal Swab; Nasopharyngeal(NP) swabs in vial transport medium  Result Value Ref Range Status   SARS Coronavirus 2 by RT PCR NEGATIVE NEGATIVE Final    Comment: (NOTE) SARS-CoV-2 target nucleic acids are NOT DETECTED.  The SARS-CoV-2 RNA is generally detectable in upper respiratory specimens during the acute phase of infection. The lowest concentration of SARS-CoV-2 viral copies this assay can detect is 138 copies/mL. A negative result does not preclude SARS-Cov-2 infection and should not be used as the sole basis for treatment or other patient management decisions. A negative result may occur with  improper specimen  collection/handling, submission of specimen other than nasopharyngeal swab, presence of viral mutation(s) within the areas targeted by this assay, and inadequate number of viral copies(<138 copies/mL). A negative result must be combined with clinical observations, patient history, and epidemiological information. The expected result is Negative.  Fact Sheet for Patients:  EntrepreneurPulse.com.au  Fact Sheet for Healthcare Providers:  IncredibleEmployment.be  This test is no t yet approved or cleared by the [place] FDA and  has been authorized for detection and/or diagnosis of SARS-CoV-2 by FDA under an Emergency Use Authorization (EUA). This EUA will remain  in effect (meaning this test can be used) for the duration of the COVID-19 declaration under Section 564(b)(1) of the Act, 21 U.S.C.section 360bbb-3(b)(1), unless the authorization is terminated  or revoked sooner.       Influenza A by PCR NEGATIVE NEGATIVE Final   Influenza B by PCR NEGATIVE NEGATIVE Final    Comment: (NOTE) The Xpert Xpress SARS-CoV-2/FLU/RSV plus assay is intended as an aid in the diagnosis of influenza from Nasopharyngeal swab specimens and should not be used as a sole basis for treatment. Nasal washings and aspirates are unacceptable for Xpert Xpress SARS-CoV-2/FLU/RSV testing.  Fact Sheet for Patients: EntrepreneurPulse.com.au  Fact Sheet for Healthcare Providers: IncredibleEmployment.be  This test is not yet approved or cleared by the [place] FDA and has been authorized for detection and/or diagnosis of SARS-CoV-2 by FDA under an Emergency Use Authorization (EUA). This EUA will remain in effect (meaning this test can be used) for the duration of the COVID-19 declaration under Section 564(b)(1) of the Act, 21 U.S.C. section 360bbb-3(b)(1), unless the authorization is terminated or revoked.  Performed at [facility], [place] [address] [place], [place] [address]     Labs: CBC: Recent Labs  Lab 09/19/21 0147 09/20/21 0100  WBC 11.0* 9.9  HGB 12.2* 11.4*  HCT 38.1* 35.1*  MCV 99.7 97.2  PLT 127* 123*    Basic Metabolic Panel: Recent Labs  Lab 09/19/21 0147 09/20/21 0100 09/21/21 0106 09/22/21 0126  NA 137 136 136 139  K 4.4 4.2 5.9* 4.2  CL 108 112* 106 110  CO2 16* 18* 18* 20*  GLUCOSE 204* 140* 198* 133*  BUN 41* 36* 34* 33*  CREATININE 2.15* 1.86* 2.01* 1.92*  CALCIUM 8.6* 8.1* 8.8* 9.0    Liver Function Tests: No results for input(s): AST, ALT, ALKPHOS, BILITOT, PROT, ALBUMIN in the last 168 hours.  CBG: Recent Labs  Lab 09/24/21 1127 09/24/21 1529 09/24/21 2049 09/25/21 0626 09/25/21 1126  GLUCAP 169* 343* 312* 152* 183*     Discharge time spent: greater than 30 minutes.  Signed: Annita Brod, MD [facility] 09/25/2021

## 2021-09-25 NOTE — Assessment & Plan Note (Signed)
Benefits from CPAP with good compliance and control Plan- continue auto 10-20 

## 2021-09-25 NOTE — Assessment & Plan Note (Signed)
Continues to work with Dr. [name] for primary pulmonary management of BOOP. Follow-up as planned.

## 2021-09-25 NOTE — Progress Notes (Signed)
Physical Therapy Treatment Patient Details Name: HALFORD GOETZKE MRN: [id number] DOB: April 22, 1946 Today's Date: 09/25/2021   History of Present Illness Pt is a 76 y.o. male admitted 09/17/21 with worsening SOB, chest pain; workup for NSTEMI. LHC 1/12 showed multivessel disease; medical management. PMH includes HTN, DM, PE, prostate CA; of note, pt with recent d/o COVID, BOOP.   PT Comments    Pt progressing with mobility. Today's session focused on gait training with rollator; ambulation for improving strength and activity tolerance. Pt remains limited by generalized weakness, decreased activity tolerance, and impaired balance strategies/postural reactions. Continue to recommend SNF-level therapies to maximize functional mobility and independence prior to return home; pt preparing for d/c today.    Recommendations for follow up therapy are one component of a multi-disciplinary discharge planning process, led by the attending physician.  Recommendations may be updated based on patient status, additional functional criteria and insurance authorization.  Follow Up Recommendations  Skilled nursing-short term rehab (<3 hours/day)     Assistance Recommended at Discharge Intermittent Supervision/Assistance  Patient can return home with the following A little help with bathing/dressing/bathroom;Assistance with cooking/housework;Assist for transportation;Help with stairs or ramp for entrance   Equipment Recommendations   (defer to next venue)    Recommendations for Other Services       Precautions / Restrictions Precautions Precautions: Fall Restrictions Weight Bearing Restrictions: No     Mobility  Bed Mobility Overal bed mobility: Modified Independent Bed Mobility: Supine to Sit     Supine to sit: Modified independent (Device/Increase time), HOB elevated          Transfers Overall transfer level: Needs assistance Equipment used: Rolling walker (2 wheels) Transfers: Sit to/from  Stand Sit to Stand: Supervision           General transfer comment: cues for locking rollator brakes; multiple sit<>stands from EOB and rollator seat 2x with supervision for safety/lines    Ambulation/Gait Ambulation/Gait assistance: Min guard Gait Distance (Feet): 28 Feet (+ 42' + 30') Assistive device: Rollator (4 wheels) Gait Pattern/deviations: Step-through pattern, Decreased stride length, Trunk flexed Gait velocity: Decreased     General Gait Details: Very slow, fatigued gait with rollator and intermittent min guard for balance; pt requiring 2x prolonged seated rest breaks secondary to fatigue; HR up to 120s, SpO2 >/97% on RA   Stairs             Wheelchair Mobility    Modified Rankin (Stroke Patients Only)       Balance Overall balance assessment: Needs assistance Sitting-balance support: No upper extremity supported, Feet supported Sitting balance-Leahy Scale: Fair     Standing balance support: Bilateral upper extremity supported, During functional activity, No upper extremity supported Standing balance-Leahy Scale: Fair Standing balance comment: can static stand and take steps without DME; preference for BUE support                            Cognition Arousal/Alertness: Awake/alert Behavior During Therapy: WFL for tasks assessed/performed Overall Cognitive Status: Within Functional Limits for tasks assessed                                          Exercises      General Comments General comments (skin integrity, edema, etc.): Wife Izora Gala) present and supportive      Pertinent Vitals/Pain Pain Assessment Pain Assessment:  No/denies pain    Home Living                          Prior Function            PT Goals (current goals can now be found in the care plan section) Progress towards PT goals: Progressing toward goals    Frequency    Min 2X/week      PT Plan Frequency needs to be updated     Co-evaluation              AM-PAC PT "6 Clicks" Mobility   Outcome Measure  Help needed turning from your back to your side while in a flat bed without using bedrails?: A Little Help needed moving from lying on your back to sitting on the side of a flat bed without using bedrails?: A Little Help needed moving to and from a bed to a chair (including a wheelchair)?: A Little Help needed standing up from a chair using your arms (e.g., wheelchair or bedside chair)?: A Little Help needed to walk in hospital room?: A Little Help needed climbing 3-5 steps with a railing? : A Lot 6 Click Score: 17    End of Session Equipment Utilized During Treatment: Gait belt Activity Tolerance: Patient tolerated treatment well Patient left: in chair;with call bell/phone within reach;with family/visitor present Nurse Communication: Mobility status PT Visit Diagnosis: Unsteadiness on feet (R26.81);Muscle weakness (generalized) (M62.81)     Time: 1601-0932 PT Time Calculation (min) (ACUTE ONLY): 20 min  Charges:  $Gait Training: 8-22 mins                     Mabeline Caras, PT, DPT Acute Rehabilitation Services  Pager [phone] Office [place] 09/25/2021, 9:52 AM

## 2021-09-26 ENCOUNTER — Ambulatory Visit: Payer: Medicare Other | Admitting: Dermatology

## 2021-09-26 DIAGNOSIS — I10 Essential (primary) hypertension: Secondary | ICD-10-CM | POA: Diagnosis not present

## 2021-09-26 DIAGNOSIS — I25119 Atherosclerotic heart disease of native coronary artery with unspecified angina pectoris: Secondary | ICD-10-CM | POA: Diagnosis not present

## 2021-09-26 DIAGNOSIS — E1159 Type 2 diabetes mellitus with other circulatory complications: Secondary | ICD-10-CM | POA: Diagnosis not present

## 2021-09-26 DIAGNOSIS — M109 Gout, unspecified: Secondary | ICD-10-CM | POA: Diagnosis not present

## 2021-09-26 DIAGNOSIS — N183 Chronic kidney disease, stage 3 unspecified: Secondary | ICD-10-CM | POA: Diagnosis not present

## 2021-09-26 DIAGNOSIS — J84113 Idiopathic non-specific interstitial pneumonitis: Secondary | ICD-10-CM | POA: Diagnosis not present

## 2021-09-30 DIAGNOSIS — R739 Hyperglycemia, unspecified: Secondary | ICD-10-CM | POA: Diagnosis not present

## 2021-10-02 DIAGNOSIS — H04129 Dry eye syndrome of unspecified lacrimal gland: Secondary | ICD-10-CM | POA: Diagnosis not present

## 2021-10-02 DIAGNOSIS — E119 Type 2 diabetes mellitus without complications: Secondary | ICD-10-CM | POA: Diagnosis not present

## 2021-10-02 DIAGNOSIS — H353131 Nonexudative age-related macular degeneration, bilateral, early dry stage: Secondary | ICD-10-CM | POA: Diagnosis not present

## 2021-10-02 DIAGNOSIS — H02889 Meibomian gland dysfunction of unspecified eye, unspecified eyelid: Secondary | ICD-10-CM | POA: Diagnosis not present

## 2021-10-03 ENCOUNTER — Inpatient Hospital Stay: Payer: PPO | Admitting: Adult Health

## 2021-10-03 DIAGNOSIS — D649 Anemia, unspecified: Secondary | ICD-10-CM | POA: Diagnosis not present

## 2021-10-03 DIAGNOSIS — E1165 Type 2 diabetes mellitus with hyperglycemia: Secondary | ICD-10-CM | POA: Diagnosis not present

## 2021-10-04 ENCOUNTER — Encounter: Payer: Self-pay | Admitting: Internal Medicine

## 2021-10-04 ENCOUNTER — Ambulatory Visit: Payer: PPO | Admitting: Internal Medicine

## 2021-10-04 ENCOUNTER — Other Ambulatory Visit: Payer: Self-pay

## 2021-10-04 DIAGNOSIS — R918 Other nonspecific abnormal finding of lung field: Secondary | ICD-10-CM | POA: Diagnosis not present

## 2021-10-04 DIAGNOSIS — R058 Other specified cough: Secondary | ICD-10-CM

## 2021-10-04 NOTE — Patient Instructions (Signed)
Encourage you to go ahead and use the cough medication as needed    Leave prednisone at 20 mg - target is to get it back 5 mg baseline    Please schedule a follow up office visit in 6 weeks, call sooner if needed

## 2021-10-04 NOTE — Assessment & Plan Note (Signed)
Recurrent pattern since feb 2017 Trial of gabapentin 100 tid 07/29/2016 >  Still tessalon dep 09/11/2016 so increased to 300 tid and ppi bid ac > not improved 11/11/2016 > rec max gerd rx and sugarless candy > resolved 12/19/2016  And off gabapentin 04/2017  - recurred early dec 2019 while off gabapentin and low dose ppi only  - 09/20/2018 rec max gerd rx and 1st gen H1 blockers per guidelines  And if not better start back on gabapentin  - 11/24/2019 recurred around November 07 2019 on return from car trip to [place] > rx pred/zpak/max gerd rx  > much better prior to 11/27/20 when caught wife's "cold"  - worse again p covid 19  08/2021  May need to add back gabapentin but says at this point the cough is actually improving on max gerd rx and with higher doses of prednisone so leave this off for now         Each maintenance medication was reviewed in detail including emphasizing most importantly the difference between maintenance and prns and under what circumstances the prns are to be triggered using an action plan format where appropriate.  Total time for H and P, chart review, counseling,  and generating customized AVS unique to this post hosp office visit / same day charting  > 30 min

## 2021-10-04 NOTE — Assessment & Plan Note (Signed)
Onset of symptoms feb 2017 with "photo neg pumonary edema pattern" Baseline PFTs 03/18/10  FVC  3.59 (80%) with ERV 67% and dlco 75 corrects to 106 - Allergy profile 12/19/15 >  Eos 0.2 /  IgE  89/ Pos Cat RAST only  12/19/2015  ESR 93 > started on prednisone 20 mg daily  HSP profile 12/19/15 >  Neg  - 01/07/2016 ESR down to 18 but only 30% improved symptomatically > rec continue 20 mg daily until better then 10 mg daily  - 02/01/2016 rec taper off x 6 weeks then ov > 05/16/2016 ESR = 58> restarted pred 06/03/16  - Collagen Vasc dz screen 07/29/2016  Neg  - PFT's  11/11/2016  FEV1 1.91 (56 % ) ratio 72  p 7 % improvement from saba p nothing prior to study with DLCO  42/43 % corrects to 77 % for alv volume but ERV 35%  - ESR  20 on 10 a/w 15 as  Of 11/11/2016 > rec try 10 mg daily  - Prednisone 5 mg daily as of 12/15/16 > taper to 5 mg qod by 02/10/2017 - 02/10/2017 ESR =  28  rec taper off x 2 weeks > no flare - 04/20/2017 ESR = 32 off pred since 02/2017  - 08/12/18  ESR = 16 with onset of cough  -  Cough recurred similar to Boop late November 2021  >  08/30/2020   Prednisone 10 mg x 2 daily until better then one daily until seen > cxr 09/26/2020 on 10 mg daily = wnl so rec taper off over 10 days  and see if cough flares  - 01/15/2021 flared again with taper to 5 mg so new floor of 10/5 and ceiling of 20 mg - 01/15/2021 rec bone density per PCP > osteopenia only     The goal with a chronic steroid dependent illness is always arriving at the lowest effective dose that controls the disease/symptoms and not accepting a set "formula" which is based on statistics or guidelines that don't always take into account patient  variability or the natural hx of the dz in every individual patient, which may well vary over time.  For now therefore I recommend the patient maintain ceiling of 20 mg and floor of 5 mg

## 2021-10-04 NOTE — Progress Notes (Signed)
Subjective:   Patient ID: Kurt Aguilar, male    DOB: [birth year]  MRN: [id number]    Brief patient profile:  60 yowm never cigarette smoker (just some cigars)  [place] started noting recurrent winter cough regardless of where located in [place] ([place]/ [place]) with typical acute episode in [place] in Feb 2017  This  cough  more dry less severe than previous >  abx and pred > improved but did not resolve and worse when stopped it so restarted pred/abx > dx as pneumonia with variable as dz > referred to pulmonary clinic 12/19/2015 by [name] with 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   History of Present Illness  12/19/2015 1st [facility] office visit/ Tashe Purdon   Chief Complaint  Patient presents with   Pulmonary Consult    Referred by [name]. Pt c/o cough x 4 wks- non prod and worse in the evening and when he lies down. Talking and exertion are things that trigger the cough. He has been on pred x 2 and both times cough resolved and immediately returns once done with med. He also c/o SOB- gets winded just walking from room to room at home.   while on prednisone still some weak and sob but better cough and last dose at least one week and worse overall since off it assoc with sob room to room and weakness/ excess/ purulent sputum or mucus plugs / no unusual exp / no ctd.  Has new CPAP machine fall 2016  rec  Omeprazole 20 mg Take 30- 60 min before your first and last meals of the day  Prednisone 10  X 2 each day until 100% better then 10 mg daily  Dx is Brochilitis obliterans with organizing pneumonia vs eosiniphic pneumonia most likely diagnosis GERD  Diet    09/11/2016  f/u ov/Dorcas Melito re: BOOP/uacs  on pred 10/5  prilosec 20 mg ac and hs  Chief Complaint  Patient presents with   Follow-up    Increased SOB for the past 3-4 days. He gets SOB with exertion such as getting dressed or walking accross the room.  He has also started coughing more "feels like I burned my  lungs"- prod with white sputum.     was doing ok on 10/5 in terms of breathing but worse gradually x one week p several weeks on the 10/5 dose  Cough some better but still needing freq tessalon on gabapentin 100 tid  rec Prednisone 10 mg  2 daily until better then 1 daily x 2 weeks then 10 alternating with 5 until return Prilosec should be 20 mg Take 30- 60 min before your first and last meals of the day  Increase gabapentin to 300 mg three times daily     01/15/2021  f/u ov/Osmel Dykstra re: BOOP back to 5 mg daily worse x several weeks  Chief Complaint  Patient presents with   Follow-up    SOB with exertion. On 5 mg or prednisone, feels like Sob is getting worse since decreased dose. Dry cough worse in the afternoon and at night.    Dyspnea:    MMRC3 = can't walk 100 yards even at a slow pace at a flat grade s stopping due to sob   Cough: dry / good at hs p 10 min of cough Sleeping: finally does sleep ok flat (no bed blocks_  SABA use: none  02: none Covid status:   vax x 3  Rec 4th vaccine is at least  6 months after the 3rd Dulera 100  (or advair)  Take 2 puffs first thing in am and then another 2 puffs about 12 hours later.  Work on inhaler technique:  Prednisone 10 mg 2 each am until better then  1 daily x one daily x one week and still better try 10 mg alternating with 5 mg daily to see if can wean to the lowest effective dose Bed blocks x 6-8 in Bone density at next check up visit with PCP >  Please schedule a follow up visit in 3 months but call sooner if needed    05/22/2021  f/u ov/Tarquin Welcher re: BOOP  maint on 5 mg daily   Chief Complaint  Patient presents with   Follow-up    Pt states no concerns  Dyspnea:  running in indoor pool  Cough: much better - still throat clearing  Sleeping: still on bed blocks  SABA use: none  02: none  Covid status:   vax x 4   Rec Make sure you check your oxygen saturation at your highest level of activity    Symptoms onset Dec 4th ST  COVID  08/26/2021  acute visit  Prometh codeine    cough syrup Benzonatate perles- for cough Molnupiravir rx  and zpak  >>>  walking room to room at home/ cough never improved and pred rx up to 40 mg   Admit date:     09/17/2021   Discharge date: 09/24/21  Discharge Physician: [name]     Discharge Diagnoses     Acute coronary syndrome / NSTEMI(HCC) Acute kidney injury on CKD stage IIIb Lactic acidosis Shortness of breath with coughing   Essential hypertension   Right bundle branch block   CAD (coronary artery disease)   DM2 (diabetes mellitus, type 2) ([place])   Steroid dependent ([place])   AKI (acute kidney injury) ([place])   Stage 3b chronic kidney disease (CKD) [facility])         Hospital Course     Patient is a 76 year old male with history of CAD status post CABG in 1997, DM2, HTN, HLD, Boop, recent COVID-19 infection in December 2022, chronic steroid dependency presented to ED with mild intermittent chest pain for a month, worse with exertion, dyspnea.  Patient was found to have elevated troponins and was admitted for further work-up    NSTEMI [facility]) with known history of CAD status post CABG x5 in 1997 -2D echo showed no wall motion abnormality, normal EF, likely demand ischemia -Left heart cardiac cath showed multivessel native coronary artery diseasesaphenous vein to right coronary artery with 70% ostial to proximal stenosis, SVG to OM 1 and 2 patent, SVG to first diagonal patent, LIMA to LAD patent.  -Continue aspirin, beta-blocker, Crestor, Zetia,       Essential hypertension -BP stable, continue beta-blocker    Hyperkalemia -Resolved   Acute on chronic CKD stage IIIb, lactic acidosis -Creatinine 2.34 on admission, baseline around 1.8-1.9 -Creatinine improved, close to baseline.  Completed IV fluid hydration.      Shortness of breath with cough -Per patient 4 weeks, dry persistent cough, possibly due to COVID-19 in December (positive on 12/4), underlying history of  BOOP -CXR with no pneumonia or effusion.  VQ scan negative for PE.  Normal BNP -Continues to feel winded with exertion, coughing spasms. -Continue prednisone 40 mg daily with a taper upon discharge. (Outpatient on 10 mg BID) -Continue scheduled nebs, incentive spirometry, Tessalon Perles  -Home O2 evaluation done on 1/15, did  not meet criteria for home O2   Hyperlipidemia -LDL 74 in 04/2021.  Continue Crestor 40 mg daily, Zetia 10 mg daily   Diabetes mellitus type 2, on long-term insulin with renal complications -Hemoglobin A1c 9.5 on 09/13/2021 --Continue insulin regimen, hopefully CBGs will continue to improve once prednisone taper is completed and back to maintenance dose   BOOP - follows Dr [name] outpatient, continue prednisone     Obesity Estimated body mass index is 32.39 kg/m as calculated from the following:   Height as of this encounter: 5\' 10"  (1.778 m).   Weight as of this encounter: 102.4 kg.  10/04/2021  f/u ov/Timur Nibert/ [facility] re: sob/ cough/ weak    maint on 20 mg prednisone   Chief Complaint  Patient presents with   Follow-up   Dyspnea:  SNF x 50 ft with walker/belt and one PT lowest sats 91%  but now anemic Cough: improving  Sleeping: 30 degrees and cpap  SABA use: none  02: none  Covid status:   vax x  5 Cp similar to angina p bm's x  7 min   No obvious day to day or daytime variability or assoc excess/ purulent sputum or mucus plugs or hemoptysis or cp or chest tightness, subjective wheeze or overt sinus or hb symptoms.   Sleeping as above without nocturnal  or early am exacerbation  of respiratory  c/o's or need for noct saba. Also denies any obvious fluctuation of symptoms with weather or environmental changes or other aggravating or alleviating factors except as outlined above   No unusual exposure hx or h/o childhood pna/ asthma or knowledge of premature birth.  Current Allergies, Complete Past Medical History, Past Surgical History, Family History,  and Social History were reviewed in [employer] record.  ROS  The following are not active complaints unless bolded Hoarseness, sore throat, dysphagia, dental problems, itching, sneezing,  nasal congestion or discharge of excess mucus or purulent secretions, ear ache,   fever, chills, sweats, unintended wt loss or wt gain, classically pleuritic or exertional cp,  orthopnea pnd or arm/hand swelling  or leg swelling, presyncope, palpitations, abdominal pain, anorexia, nausea, vomiting, diarrhea  or change in bowel habits or change in bladder habits, change in stools or change in urine, dysuria, hematuria,  rash, arthralgias, visual complaints, headache, numbness, weakness or ataxia or problems with walking or coordination,  change in mood or  memory. Bruising over lower abd        Current Meds  Medication Sig   acetaminophen (TYLENOL) 650 MG CR tablet Take 650 mg by mouth every 8 (eight) hours as needed for pain.   albuterol (VENTOLIN HFA) 108 (90 Base) MCG/ACT inhaler Inhale 2 puffs into the lungs every 6 (six) hours as needed for wheezing or shortness of breath.   allopurinol (ZYLOPRIM) 100 MG tablet Take 1 tablet (100 mg total) by mouth 2 (two) times daily. (Patient taking differently: Take 100 mg by mouth 2 (two) times daily. Patient states taking 80mg  daily)   aspirin 81 MG tablet Take 1 tablet (81 mg total) by mouth 2 (two) times daily after a meal.   benzonatate (TESSALON) 200 MG capsule Take 1 capsule (200 mg total) by mouth 3 (three) times daily as needed for cough.   Cholecalciferol (VITAMIN D3 PO) Take 1 capsule by mouth daily.   colchicine 0.6 MG tablet TAKE 1 TABLET BY MOUTH TWICE A DAY WITH A MEAL AS NEEDED FOR ACUTE FLARE OF GOUT   ezetimibe (  ZETIA) 10 MG tablet Take 1 tablet (10 mg total) by mouth daily.   famotidine (PEPCID) 20 MG tablet TAKE 1 TABLET BY MOUTH EVERYDAY AT BEDTIME   Glucosamine HCl (GLUCOSAMINE PO) Take 1,500 mg by mouth 2 (two) times daily.     glucose blood (FREESTYLE LITE) test strip 1 each by Other route 4 (four) times daily. E11.65   HYDROcodone bit-homatropine (HYCODAN) 5-1.5 MG/5ML syrup Take 5 mLs by mouth every 6 (six) hours as needed for cough.   insulin isophane & regular human KwikPen (NOVOLIN 70/30 KWIKPEN) (70-30) 100 UNIT/ML KwikPen Inject 20 Units into the skin daily with breakfast. And pen needles 1/day   INVOKANA 100 MG TABS tablet TAKE 1 TABLET BY MOUTH EVERY OTHER DAY   Lancets (FREESTYLE) lancets 1 EACH BY OTHER ROUTE 4 (FOUR) TIMES DAILY. E11.65   Lutein 20 MG CAPS Take 20 mg by mouth daily.    methocarbamol (ROBAXIN) 500 MG tablet Take 1 tablet (500 mg total) by mouth every 8 (eight) hours as needed for muscle spasms. Caution of sedation   metoprolol tartrate (LOPRESSOR) 50 MG tablet Take 1 tablet (50 mg total) by mouth 2 (two) times daily.   Multiple Vitamins-Minerals (ZINC PO) Take 1 tablet by mouth daily.   nitroGLYCERIN (NITROSTAT) 0.4 MG SL tablet Place 1 tablet (0.4 mg total) under the tongue every 5 (five) minutes x 3 doses as needed for chest pain.   omeprazole (PRILOSEC) 40 MG capsule TAKE 1 CAPUSLE BY MOUTH 30- 60 MIN BEFORE YOUR FIRST AND LAST MEALS OF THE DAY   ondansetron (ZOFRAN) 8 MG tablet Take 1 tablet (8 mg total) by mouth every 8 (eight) hours as needed for nausea or vomiting. From narcotic pain medicine   polyethylene glycol (MIRALAX) 17 g packet Take 17 g by mouth daily.   predniSONE (DELTASONE) 10 MG tablet Take prednisone 30 mg (3 tabs) for 3 days, then continue your maintenance dose of prednisone   rosuvastatin (CRESTOR) 40 MG tablet Take 1 tablet (40 mg total) by mouth daily.   [DISCONTINUED] Bioflavonoid Products (VITAMIN C) CHEW Chew 1 tablet by mouth daily.   [DISCONTINUED] Blood Glucose Monitoring Suppl (FREESTYLE LITE) DEVI 1 each by Does not apply route 4 (four) times daily. E11.65   [DISCONTINUED] Carboxymethylcellul-Glycerin (LUBRICATING EYE DROPS OP) Place 1 drop into both eyes daily  as needed (dry eyes).   [DISCONTINUED] CINNAMON PO Take 1,000 mg by mouth 2 (two) times daily.   [DISCONTINUED] insulin glargine (LANTUS) 100 UNIT/ML injection Inject 30 Units into the skin daily.   [DISCONTINUED] predniSONE (DELTASONE) 5 MG tablet Take 2 tablets (10 mg total) by mouth in the morning and at bedtime. Resume after prednisone taper is completed                            Objective:   Physical Exam   10/04/2021        230  05/22/2021        230   01/15/2021       231  09/26/2020        220  11/24/2019        206  09/20/2018        222  08/26/2018      222 07/29/2017      232  04/15/2017          243  02/10/2017          250  12/19/2016  250 11/11/2016          258  09/11/2016          252  07/29/2016      244  05/16/2016          238  02/01/2016        235  01/07/2016          228   12/19/15 229 lb 4 oz (103.987 kg)  12/19/15 229 lb 4 oz (103.987 kg)  12/19/15 227 lb (102.967 kg)     Vital signs reviewed  10/04/2021  - Note at rest 02 sats  99% on RA   General appearance:    elderly hoarse pale w/c bound wm cough on deep insp    HEENT : pt wearing mask not removed for exam due to covid -19 concerns.    NECK :  without JVD/Nodes/TM/ nl carotid upstrokes bilaterally   LUNGS: no acc muscle use,  Nl contour chest with coarse insp crackles  bilaterally without cough on insp   maneuvers   CV:  RRR  no s3 or murmur or increase in P2, and trace to 1+ sym LE bilateral  edema   ABD:  obese soft and nontender with  limited insp excursion and extensive bruising below umbilicus  MS:   ext warm without deformities, calf tenderness, cyanosis or clubbing No obvious joint restrictions   SKIN: warm and dry without lesions    NEURO:  alert, approp, nl sensorium with  no motor or cerebellar deficits apparent.        Assessment & Plan:

## 2021-10-08 DIAGNOSIS — I10 Essential (primary) hypertension: Secondary | ICD-10-CM | POA: Diagnosis not present

## 2021-10-08 DIAGNOSIS — N1832 Chronic kidney disease, stage 3b: Secondary | ICD-10-CM | POA: Diagnosis not present

## 2021-10-08 DIAGNOSIS — R809 Proteinuria, unspecified: Secondary | ICD-10-CM | POA: Diagnosis not present

## 2021-10-08 DIAGNOSIS — E1129 Type 2 diabetes mellitus with other diabetic kidney complication: Secondary | ICD-10-CM | POA: Diagnosis not present

## 2021-10-08 DIAGNOSIS — Q612 Polycystic kidney, adult type: Secondary | ICD-10-CM | POA: Diagnosis not present

## 2021-10-09 ENCOUNTER — Inpatient Hospital Stay: Payer: PPO | Admitting: Family Medicine

## 2021-10-10 ENCOUNTER — Encounter: Payer: Self-pay | Admitting: Medical

## 2021-10-10 ENCOUNTER — Ambulatory Visit: Payer: PPO | Admitting: Medical

## 2021-10-10 ENCOUNTER — Other Ambulatory Visit: Payer: Self-pay

## 2021-10-10 VITALS — BP 130/62 | HR 67 | Ht 70.0 in | Wt 232.0 lb

## 2021-10-10 DIAGNOSIS — I10 Essential (primary) hypertension: Secondary | ICD-10-CM

## 2021-10-10 DIAGNOSIS — E782 Mixed hyperlipidemia: Secondary | ICD-10-CM

## 2021-10-10 DIAGNOSIS — R600 Localized edema: Secondary | ICD-10-CM | POA: Diagnosis not present

## 2021-10-10 DIAGNOSIS — N1832 Chronic kidney disease, stage 3b: Secondary | ICD-10-CM | POA: Diagnosis not present

## 2021-10-10 DIAGNOSIS — E1159 Type 2 diabetes mellitus with other circulatory complications: Secondary | ICD-10-CM | POA: Diagnosis not present

## 2021-10-10 DIAGNOSIS — I25119 Atherosclerotic heart disease of native coronary artery with unspecified angina pectoris: Secondary | ICD-10-CM | POA: Diagnosis not present

## 2021-10-10 DIAGNOSIS — I5032 Chronic diastolic (congestive) heart failure: Secondary | ICD-10-CM

## 2021-10-10 DIAGNOSIS — J8489 Other specified interstitial pulmonary diseases: Secondary | ICD-10-CM | POA: Diagnosis not present

## 2021-10-10 DIAGNOSIS — K219 Gastro-esophageal reflux disease without esophagitis: Secondary | ICD-10-CM | POA: Diagnosis not present

## 2021-10-10 DIAGNOSIS — I25708 Atherosclerosis of coronary artery bypass graft(s), unspecified, with other forms of angina pectoris: Secondary | ICD-10-CM

## 2021-10-10 MED ORDER — FUROSEMIDE 20 MG PO TABS: 20.0000 mg | ORAL_TABLET | Freq: Every day | ORAL | 3 refills | Status: DC

## 2021-10-10 NOTE — Progress Notes (Signed)
Cardiology Office Note:    Date:  10/13/2021   ID:  Kurt Aguilar, DOB 19-Jan-1946, MRN [id number]  PCP:  [name], MD  [facility] Cardiologist:  [name], MD  [place] Electrophysiologist:  None   Referring MD: [name], MD   Chief Complaint: Hospital follow-up  History of Present Illness:    Kurt Aguilar is a 76 y.o. male with a hx of coronary artery disease status post CABG x5 in 1997, type 2 diabetes, chronic kidney disease 3,  HTN, HLD who presents for hospital follow-up.  Recently admitted for NSTEMI. Echo showed normal EF and no WMA. LHC showed multivessel CAD of the native arteries with 100% proximal RCA, 90% ostial circumflex, 99% distal left main, severely and diffusely diseased proximal to mid LAD with threatened septal perforator, diffusely disease saphenous vein graft to the right coronary, ostial to proximal 70% stenosis, patent grafts. Suspected enzyme elevation from LCA in the septal perforator territory. The patient was discharged to [facility]  Today, the patient reports shortness of breath on exertion and some exertional chest pain. Unsure if chest pain this is similar to hospitalization. Michela Pitcher he had an episode of angina a couple months ago. BOOP seems to be getting better. He previously had COVID which exacerbated the BOOP. HE is on chronic steroids, has swelling on his feet. He is not on lasix.   Past Medical History:  Diagnosis Date   Arthritis    Chronic kidney disease    Complication of anesthesia    constipation and inability to  urinate happened a few days after cataract surgery    Diabetes ([place])    type 2    Diverticulitis    GERD (gastroesophageal reflux disease)    Gout    Heart attack ([place])    Heart disease    Heart murmur    Hyperglycemia    Hyperlipidemia    Hypertension    Osteoporosis    Pneumonia    hx of BOOP followed by Dr [name]    Sleep apnea    cpapp - setting at 15     Past Surgical History:   Procedure Laterality Date   ACROMIO-CLAVICULAR JOINT REPAIR Left 05/05/2019   Procedure: SHOULDER ARTHROSCOPIC ASSISTED ACROMIO-CLAVICULAR JOINT REPAIR;  Surgeon: Tania Ade, MD;  Location: [facility];  Service: Orthopedics;  Laterality: Left;   CATARACT EXTRACTION Bilateral    CORONARY ARTERY BYPASS GRAFT     [place]   LEFT HEART CATH AND CORS/GRAFTS ANGIOGRAPHY N/A 09/19/2021   Procedure: LEFT HEART CATH AND CORS/GRAFTS ANGIOGRAPHY;  Surgeon: Belva Crome, MD;  Location: [place] [facility];  Service: Cardiovascular;  Laterality: N/A;   open heart surgery  09/09/1995-1998   5 bypass   REPAIR KNEE LIGAMENT     right   SHOULDER ARTHROSCOPY Left 05/05/2019   Procedure: ARTHROSCOPY SHOULDER;  Surgeon: Tania Ade, MD;  Location: [facility];  Service: Orthopedics;  Laterality: Left;   TONSILLECTOMY     TOTAL KNEE ARTHROPLASTY Left 05/29/2020   Procedure: LEFT TOTAL KNEE ARTHROPLASTY;  Surgeon: Melrose Nakayama, MD;  Location: [facility];  Service: Orthopedics;  Laterality: Left;    Current Medications: Current Meds  Medication Sig   acetaminophen (TYLENOL) 650 MG CR tablet Take 650 mg by mouth every 8 (eight) hours as needed for pain.   albuterol (VENTOLIN HFA) 108 (90 Base) MCG/ACT inhaler Inhale 2 puffs into the lungs every 6 (six) hours as needed for wheezing or shortness of breath.  allopurinol (ZYLOPRIM) 100 MG tablet Take 1 tablet (100 mg total) by mouth 2 (two) times daily. (Patient taking differently: Take 100 mg by mouth 2 (two) times daily. Patient states taking 80mg  daily)   aspirin 81 MG tablet Take 1 tablet (81 mg total) by mouth 2 (two) times daily after a meal.   benzonatate (TESSALON) 200 MG capsule Take 1 capsule (200 mg total) by mouth 3 (three) times daily as needed for cough.   Cholecalciferol (VITAMIN D3 PO) Take 1 capsule by mouth daily.   colchicine 0.6 MG tablet TAKE 1 TABLET BY MOUTH TWICE A DAY WITH A MEAL AS NEEDED FOR ACUTE FLARE OF GOUT   empagliflozin  (JARDIANCE) 10 MG TABS tablet Take by mouth daily.   ezetimibe (ZETIA) 10 MG tablet Take 1 tablet (10 mg total) by mouth daily.   famotidine (PEPCID) 20 MG tablet TAKE 1 TABLET BY MOUTH EVERYDAY AT BEDTIME   furosemide (LASIX) 20 MG tablet Take 1 tablet (20 mg total) by mouth daily.   Glucosamine HCl (GLUCOSAMINE PO) Take 1,500 mg by mouth 2 (two) times daily.    glucose blood (FREESTYLE LITE) test strip 1 each by Other route 4 (four) times daily. E11.65   HYDROcodone bit-homatropine (HYCODAN) 5-1.5 MG/5ML syrup Take 5 mLs by mouth every 6 (six) hours as needed for cough.   insulin isophane & regular human KwikPen (NOVOLIN 70/30 KWIKPEN) (70-30) 100 UNIT/ML KwikPen Inject 20 Units into the skin daily with breakfast. And pen needles 1/day   Lancets (FREESTYLE) lancets 1 EACH BY OTHER ROUTE 4 (FOUR) TIMES DAILY. E11.65   Lutein 20 MG CAPS Take 20 mg by mouth daily.    methocarbamol (ROBAXIN) 500 MG tablet Take 1 tablet (500 mg total) by mouth every 8 (eight) hours as needed for muscle spasms. Caution of sedation   metoprolol tartrate (LOPRESSOR) 50 MG tablet Take 1 tablet (50 mg total) by mouth 2 (two) times daily.   Multiple Vitamins-Minerals (ZINC PO) Take 1 tablet by mouth daily.   nitroGLYCERIN (NITROSTAT) 0.4 MG SL tablet Place 1 tablet (0.4 mg total) under the tongue every 5 (five) minutes x 3 doses as needed for chest pain.   omeprazole (PRILOSEC) 40 MG capsule TAKE 1 CAPUSLE BY MOUTH 30- 60 MIN BEFORE YOUR FIRST AND LAST MEALS OF THE DAY   ondansetron (ZOFRAN) 8 MG tablet Take 1 tablet (8 mg total) by mouth every 8 (eight) hours as needed for nausea or vomiting. From narcotic pain medicine   polyethylene glycol (MIRALAX / GLYCOLAX) 17 g packet Take 17 g by mouth daily.   predniSONE (DELTASONE) 10 MG tablet Take prednisone 30 mg (3 tabs) for 3 days, then continue your maintenance dose of prednisone   rosuvastatin (CRESTOR) 40 MG tablet Take 1 tablet (40 mg total) by mouth daily.      Allergies:   Oxycodone-acetaminophen   Social History   Socioeconomic History   Marital status: Married    Spouse name: Not on file   Number of children: 4   Years of education: 14   Highest education level: Not on file  Occupational History   Occupation: Regulatory affairs officer    Employer: RETIRED  Tobacco Use   Smoking status: Former    Packs/day: 1.00    Years: 3.00    Pack years: 3.00    Types: Cigars, Cigarettes    Quit date: 11/25/2004    Years since quitting: 16.8   Smokeless tobacco: Never   Tobacco comments:    occas.  cigar quit 2006  Vaping Use   Vaping Use: Never used  Substance and Sexual Activity   Alcohol use: Yes    Alcohol/week: 1.0 standard drink    Types: 1 Standard drinks or equivalent per week    Comment: rare   Drug use: No   Sexual activity: Never  Other Topics Concern   Not on file  Social History Narrative   Not on file   Social Determinants of Health   Financial Resource Strain: Low Risk    Difficulty of Paying Living Expenses: Not hard at all  Food Insecurity: No Food Insecurity   Worried About Charity fundraiser in the Last Year: Never true   [place] in the Last Year: Never true  Transportation Needs: No Transportation Needs   Lack of Transportation (Medical): No   Lack of Transportation (Non-Medical): No  Physical Activity: Sufficiently Active   Days of Exercise per Week: 4 days   Minutes of Exercise per Session: 50 min  Stress: No Stress Concern Present   Feeling of Stress : Not at all  Social Connections: Not on file     Family History: The patient's family history includes Heart attack (age of onset: 6) in his son; Heart disease in his father and mother; Stroke in his father. There is no history of Kidney disease, Prostate cancer, or Diabetes.  ROS:   Please see the history of present illness.     All other systems reviewed and are negative.  EKGs/Labs/Other Studies Reviewed:    The following studies were reviewed  today:  LHC 09/19/21 CONCLUSIONS: Severe diffuse calcified coronary disease of the native arteries with 100% proximal RCA, 90% ostial circumflex, 99% distal left main, severely and diffusely diseased proximal to mid LAD with threatened septal perforator. Diffusely diseased saphenous vein graft to the right coronary.  Ostial to proximal 70% stenosis. Widely patent sequential graft to first and second obtuse marginal Widely patent saphenous vein graft to the first diagonal Widely patent LIMA to the mid LAD Aortic valve is heavily calcified, was difficult to cross, but without significant gradient.  LVEDP 10 mmHg. Total contrast 70 cc   RECOMMENDATIONS:   Continue IV fluids for 18 to 24 hours.  He was given an additional 250 cc of saline as a bolus after identifying that LVEDP was 9 mmHg. Monitor kidney function closely.  Contrast exposure was 70 cc. The saphenous vein graft to the right coronary can be stented although it is not so critical that it should be causing enzyme elevation.  Enzyme elevation likely related to left coronary native disease in the septal perforator territory.    Coronary Diagrams  Diagnostic Dominance: Right    TTE 09/18/2021 IMPRESSIONS     1. Left ventricular ejection fraction, by estimation, is 60 to 65%. The  left ventricle has normal function. The left ventricle has no regional  wall motion abnormalities. Left ventricular diastolic parameters are  consistent with Grade I diastolic  dysfunction (impaired relaxation). Elevated left ventricular end-diastolic  pressure.   2. Right ventricular systolic function is moderately reduced. The right  ventricular size is normal.   3. The mitral valve is normal in structure. No evidence of mitral valve  regurgitation. No evidence of mitral stenosis.   4. The AV is poorly visualized to comment on structure. There does appear  to be come calcification of the valve. . The aortic valve was not well  visualized.  Aortic valve regurgitation is not visualized. No aortic  stenosis is present.   FINDINGS   Left Ventricle: Left ventricular ejection fraction, by estimation, is 60  to 65%. The left ventricle has normal function. The left ventricle has no  regional wall motion abnormalities. Definity contrast agent was given IV  to delineate the left ventricular   endocardial borders. The left ventricular internal cavity size was normal  in size. There is no left ventricular hypertrophy. Left ventricular  diastolic parameters are consistent with Grade I diastolic dysfunction  (impaired relaxation). Elevated left  ventricular end-diastolic pressure.   Right Ventricle: The right ventricular size is normal. No increase in  right ventricular wall thickness. Right ventricular systolic function is  moderately reduced.   Left Atrium: Left atrial size was normal in size.   Right Atrium: Right atrial size was normal in size.   Pericardium: There is no evidence of pericardial effusion.   Mitral Valve: The mitral valve is normal in structure. No evidence of  mitral valve regurgitation. No evidence of mitral valve stenosis.   Tricuspid Valve: The tricuspid valve is normal in structure. Tricuspid  valve regurgitation is mild . No evidence of tricuspid stenosis.   Aortic Valve: The AV is poorly visualized to comment on structure. There  does appear to be come calcification of the valve. The aortic valve was  not well visualized. Aortic valve regurgitation is not visualized. No  aortic stenosis is present. Aortic  valve mean gradient measures 3.0 mmHg. Aortic valve peak gradient measures  4.8 mmHg. Aortic valve area, by VTI measures 2.47 cm.   Pulmonic Valve: The pulmonic valve was normal in structure. Pulmonic valve  regurgitation is not visualized. No evidence of pulmonic stenosis.   Aorta: The aortic root is normal in size and structure.   Venous: The inferior vena cava was not well visualized.    IAS/Shunts: No atrial level shunt detected by color flow Doppler.   EKG:  EKG is  ordered today.  The ekg ordered today demonstrates NSR, 67bpm, LAD, RBBB, LVH, TWI aVF  Recent Labs: 04/09/2021: TSH 3.86 09/17/2021: ALT 36; B Natriuretic Peptide 37.7 09/20/2021: Hemoglobin 11.4; Platelets 123 09/22/2021: BUN 33; Creatinine, Ser 1.92; Potassium 4.2; Sodium 139  Recent Lipid Panel    Component Value Date/Time   CHOL 154 04/09/2021 0754   TRIG 242.0 (H) 04/09/2021 0754   HDL 44.30 04/09/2021 0754   CHOLHDL 3 04/09/2021 0754   VLDL 48.4 (H) 04/09/2021 0754   LDLCALC 45 03/24/2019 0925   LDLDIRECT 74.0 04/09/2021 0754     Physical Exam:    VS:  BP 130/62    Pulse 67    Ht 5\' 10"  (1.778 m)    Wt 232 lb (105.2 kg)    SpO2 96%    BMI 33.29 kg/m     Wt Readings from Last 3 Encounters:  10/10/21 232 lb (105.2 kg)  10/04/21 230 lb (104.3 kg)  09/22/21 225 lb 12 oz (102.4 kg)     GEN:  Well nourished, well developed in no acute distress HEENT: Normal NECK: No JVD; No carotid bruits LYMPHATICS: No lymphadenopathy CARDIAC: RRR, no murmurs, rubs, gallops RESPIRATORY:  Clear to auscultation without rales, wheezing or rhonchi  ABDOMEN: Soft, non-tender, non-distended MUSCULOSKELETAL:  No edema; No deformity  SKIN: Warm and dry NEUROLOGIC:  Alert and oriented x 3 PSYCHIATRIC:  Normal affect   ASSESSMENT:    1. Coronary artery disease of bypass graft of native heart with stable angina pectoris ([place])   2. Lower leg edema   3.  Chronic diastolic heart failure ([place])   4. Essential hypertension   5. Hyperlipidemia, mixed    PLAN:    In order of problems listed above:  NSTEMI/demand ischemia CAD Recent cath showed multivessel CAD of the native arteries with 100% proximal RCA, 90% ostial circumflex, 99% distal left main, severely and diffusely diseased proximal to mid LAD with threatened septal perforator, diffusely disease saphenous vein graft to the right coronary, ostial to  proximal 70% stenosis, patent grafts. It was felt SVG to the RCA could be stented although felt it was not so critical that it was causing enzyme elevation. Enzyme elevation suspected related to left coronary disease. The patient has been doing well since catheterization. Right groin site is stable. He has SOB and CP, however has chronic BOOP and mild volume overload today. Says chest pain is not like prior chest pain. Continue Aspirin, Zetia, BB. Would evaluate symptoms at follow-up.   LLE HFpEF Lower leg edema on exam, however he is on chronic steroids for BOOP. He has been on lasix on the past. I will restart Lasix 20mg  daily. BMET in a week. Recommend compression socks, leg elevation, and low salt diet.   Dyslipidemia LDL 74 04/2021. Continue Zetia and Crestor.  CKD stage 3 Most recent labs showed stable kidney function.  HTN BP is good today, continue current regimen   BOOP He is on chronic prednisone.   Disposition: Follow up 3-4 weeks with MD/APP     Signed, Kashina Mecum Ninfa Meeker, PA-C  10/13/2021 7:14 PM    [facility]

## 2021-10-10 NOTE — Patient Instructions (Signed)
Medication Instructions:  Your physician has recommended you make the following change in your medication:   START Furosemide 20 mg once daily   *If you need a refill on your cardiac medications before your next appointment, please call your pharmacy*   Lab Work: BMET in one week  If you have labs (blood work) drawn today and your tests are completely normal, you will receive your results only by: [place] (if you have MyChart) OR A paper copy in the mail If you have any lab test that is abnormal or we need to change your treatment, we will call you to review the results.   Testing/Procedures: None   Follow-Up: At [facility], you and your health needs are our priority.  As part of our continuing mission to provide you with exceptional heart care, we have created designated Provider Care Teams.  These Care Teams include your primary Cardiologist (physician) and Advanced Practice Providers (APPs -  Physician Assistants and Nurse Practitioners) who all work together to provide you with the care you need, when you need it.  Your next appointment:   3 week(s)  The format for your next appointment:   In Person  Provider:   [name], MD

## 2021-10-15 DIAGNOSIS — I214 Non-ST elevation (NSTEMI) myocardial infarction: Secondary | ICD-10-CM | POA: Diagnosis not present

## 2021-10-16 ENCOUNTER — Other Ambulatory Visit: Payer: Self-pay

## 2021-10-16 ENCOUNTER — Ambulatory Visit: Payer: PPO | Admitting: Endocrinology

## 2021-10-16 VITALS — BP 126/70 | HR 93 | Ht 70.0 in | Wt 224.2 lb

## 2021-10-16 DIAGNOSIS — E1122 Type 2 diabetes mellitus with diabetic chronic kidney disease: Secondary | ICD-10-CM | POA: Diagnosis not present

## 2021-10-16 DIAGNOSIS — N1832 Chronic kidney disease, stage 3b: Secondary | ICD-10-CM

## 2021-10-16 MED ORDER — FREESTYLE LIBRE 2 SENSOR MISC: 1.0000 | 3 refills | Status: DC

## 2021-10-16 NOTE — Progress Notes (Signed)
Subjective:    Patient ID: Kurt Aguilar, male    DOB: 05-18-1946, 76 y.o.   MRN: [id number]  HPI Pt returns for f/u of diabetes mellitus:  DM type: Insulin-requiring type 2 Dx'ed: 6378 Complications: PN, stage 3b CRI, and CAD.   Therapy: insulin since 2023, and Invokana.   DKA: never.  Severe hypoglycemia: never.  Pancreatitis: never.  Other: he takes intermitt prednisone for BOOP; renal insuff limits rx options; he also took insulin 2018-2019.   Interval history: prednisone (for BOOP) is tapered to 20 mg qd.  He was recently in the hospital with MI.  He was d/c'ed to [facility].  At facility, insulin was changed to Lantus, 40 units at bedtime.  We have not received cbg record from facility.   Past Medical History:  Diagnosis Date   Arthritis    Chronic kidney disease    Complication of anesthesia    constipation and inability to  urinate happened a few days after cataract surgery    Diabetes ([place])    type 2    Diverticulitis    GERD (gastroesophageal reflux disease)    Gout    Heart attack ([place])    Heart disease    Heart murmur    Hyperglycemia    Hyperlipidemia    Hypertension    Osteoporosis    Pneumonia    hx of BOOP followed by Dr [name]    Sleep apnea    cpapp - setting at 15     Past Surgical History:  Procedure Laterality Date   ACROMIO-CLAVICULAR JOINT REPAIR Left 05/05/2019   Procedure: SHOULDER ARTHROSCOPIC ASSISTED ACROMIO-CLAVICULAR JOINT REPAIR;  Surgeon: Tania Ade, MD;  Location: [facility];  Service: Orthopedics;  Laterality: Left;   CATARACT EXTRACTION Bilateral    CORONARY ARTERY BYPASS GRAFT     [place]   LEFT HEART CATH AND CORS/GRAFTS ANGIOGRAPHY N/A 09/19/2021   Procedure: LEFT HEART CATH AND CORS/GRAFTS ANGIOGRAPHY;  Surgeon: Belva Crome, MD;  Location: [place] [facility];  Service: Cardiovascular;  Laterality: N/A;   open heart surgery  09/09/1995-1998   5 bypass   REPAIR KNEE LIGAMENT     right   SHOULDER ARTHROSCOPY  Left 05/05/2019   Procedure: ARTHROSCOPY SHOULDER;  Surgeon: Tania Ade, MD;  Location: [facility];  Service: Orthopedics;  Laterality: Left;   TONSILLECTOMY     TOTAL KNEE ARTHROPLASTY Left 05/29/2020   Procedure: LEFT TOTAL KNEE ARTHROPLASTY;  Surgeon: Melrose Nakayama, MD;  Location: [facility];  Service: Orthopedics;  Laterality: Left;    Social History   Socioeconomic History   Marital status: Married    Spouse name: Not on file   Number of children: 4   Years of education: 14   Highest education level: Not on file  Occupational History   Occupation: Regulatory affairs officer    Employer: RETIRED  Tobacco Use   Smoking status: Former    Packs/day: 1.00    Years: 3.00    Pack years: 3.00    Types: Cigars, Cigarettes    Quit date: 11/25/2004    Years since quitting: 16.9   Smokeless tobacco: Never   Tobacco comments:    occas. cigar quit 2006  Vaping Use   Vaping Use: Never used  Substance and Sexual Activity   Alcohol use: Yes    Alcohol/week: 1.0 standard drink    Types: 1 Standard drinks or equivalent per week    Comment: rare   Drug use: No   Sexual  activity: Never  Other Topics Concern   Not on file  Social History Narrative   Not on file   Social Determinants of Health   Financial Resource Strain: Low Risk    Difficulty of Paying Living Expenses: Not hard at all  Food Insecurity: No Food Insecurity   Worried About Charity fundraiser in the Last Year: Never true   [place] in the Last Year: Never true  Transportation Needs: No Transportation Needs   Lack of Transportation (Medical): No   Lack of Transportation (Non-Medical): No  Physical Activity: Sufficiently Active   Days of Exercise per Week: 4 days   Minutes of Exercise per Session: 50 min  Stress: No Stress Concern Present   Feeling of Stress : Not at all  Social Connections: Not on file  Intimate Partner Violence: Not At Risk   Fear of Current or Ex-Partner: No   Emotionally Abused: No   Physically  Abused: No   Sexually Abused: No    Current Outpatient Medications on File Prior to Visit  Medication Sig Dispense Refill   acetaminophen (TYLENOL) 650 MG CR tablet Take 650 mg by mouth every 8 (eight) hours as needed for pain.     albuterol (VENTOLIN HFA) 108 (90 Base) MCG/ACT inhaler Inhale 2 puffs into the lungs every 6 (six) hours as needed for wheezing or shortness of breath. 8 g 2   allopurinol (ZYLOPRIM) 100 MG tablet Take 1 tablet (100 mg total) by mouth 2 (two) times daily. (Patient taking differently: Take 100 mg by mouth 2 (two) times daily. Patient states taking 80mg  daily) 180 tablet 3   aspirin 81 MG tablet Take 1 tablet (81 mg total) by mouth 2 (two) times daily after a meal. 30 tablet 0   benzonatate (TESSALON) 200 MG capsule Take 1 capsule (200 mg total) by mouth 3 (three) times daily as needed for cough. 30 capsule 1   Cholecalciferol (VITAMIN D3 PO) Take 1 capsule by mouth daily.     colchicine 0.6 MG tablet TAKE 1 TABLET BY MOUTH TWICE A DAY WITH A MEAL AS NEEDED FOR ACUTE FLARE OF GOUT 45 tablet 3   empagliflozin (JARDIANCE) 10 MG TABS tablet Take by mouth daily.     ezetimibe (ZETIA) 10 MG tablet Take 1 tablet (10 mg total) by mouth daily. 90 tablet 3   famotidine (PEPCID) 20 MG tablet TAKE 1 TABLET BY MOUTH EVERYDAY AT BEDTIME 90 tablet 1   furosemide (LASIX) 20 MG tablet Take 1 tablet (20 mg total) by mouth daily. 90 tablet 3   Glucosamine HCl (GLUCOSAMINE PO) Take 1,500 mg by mouth 2 (two) times daily.      glucose blood (FREESTYLE LITE) test strip 1 each by Other route 4 (four) times daily. E11.65 400 strip 0   HYDROcodone bit-homatropine (HYCODAN) 5-1.5 MG/5ML syrup Take 5 mLs by mouth every 6 (six) hours as needed for cough. 240 mL 0   insulin isophane & regular human KwikPen (NOVOLIN 70/30 KWIKPEN) (70-30) 100 UNIT/ML KwikPen Inject 20 Units into the skin daily with breakfast. And pen needles 1/day 15 mL 11   INVOKANA 100 MG TABS tablet TAKE 1 TABLET BY MOUTH EVERY  OTHER DAY 30 tablet 5   Lancets (FREESTYLE) lancets 1 EACH BY OTHER ROUTE 4 (FOUR) TIMES DAILY. E11.65 400 each 0   Lutein 20 MG CAPS Take 20 mg by mouth daily.      methocarbamol (ROBAXIN) 500 MG tablet Take 1 tablet (500 mg  total) by mouth every 8 (eight) hours as needed for muscle spasms. Caution of sedation 40 tablet 1   metoprolol tartrate (LOPRESSOR) 50 MG tablet Take 1 tablet (50 mg total) by mouth 2 (two) times daily. 60 tablet 3   Multiple Vitamins-Minerals (ZINC PO) Take 1 tablet by mouth daily.     nitroGLYCERIN (NITROSTAT) 0.4 MG SL tablet Place 1 tablet (0.4 mg total) under the tongue every 5 (five) minutes x 3 doses as needed for chest pain. 30 tablet 12   omeprazole (PRILOSEC) 40 MG capsule TAKE 1 CAPUSLE BY MOUTH 30- 60 MIN BEFORE YOUR FIRST AND LAST MEALS OF THE DAY 180 capsule 3   ondansetron (ZOFRAN) 8 MG tablet Take 1 tablet (8 mg total) by mouth every 8 (eight) hours as needed for nausea or vomiting. From narcotic pain medicine 15 tablet 0   polyethylene glycol (MIRALAX / GLYCOLAX) 17 g packet Take 17 g by mouth daily.     predniSONE (DELTASONE) 10 MG tablet Take prednisone 30 mg (3 tabs) for 3 days, then continue your maintenance dose of prednisone 30 tablet 0   rosuvastatin (CRESTOR) 40 MG tablet Take 1 tablet (40 mg total) by mouth daily. 30 tablet 3   No current facility-administered medications on file prior to visit.    Allergies  Allergen Reactions   Oxycodone-Acetaminophen Other (See Comments)    Stops breathing; has tolerated Tylenol before    Family History  Problem Relation Age of Onset   Heart disease Mother    Heart disease Father    Stroke Father    Heart attack Son 24   Kidney disease Neg Hx    Prostate cancer Neg Hx    Diabetes Neg Hx     BP 126/70    Pulse 93    Ht 5\' 10"  (1.778 m)    Wt 224 lb 3.2 oz (101.7 kg)    SpO2 97%    BMI 32.17 kg/m    Review of Systems     Objective:   Physical Exam   Lab Results  Component Value Date    CREATININE 1.92 (H) 09/22/2021   BUN 33 (H) 09/22/2021   NA 139 09/22/2021   K 4.2 09/22/2021   CL 110 09/22/2021   CO2 20 (L) 09/22/2021      Assessment & Plan:  Insulin-requiring type 2 DM Stage 4 CRF: he needs a faster-acting qam insulin.  However, we need cbg record in order to safely adjust insulin  Patient Instructions  We are asking your facility to send [place] the blood sugar record.  Based on this, we'll probably change the insulin back to 70/30, 40 units with breakfast.    Please call or message [place] next week, to tell us how the blood sugar is doing.   check your blood sugar twice a day.  vary the time of day when you check, between before the 3 meals, and at bedtime.  also check if you have symptoms of your blood sugar being too high or too low.  please keep a record of the readings and bring it to your next appointment here (or you can bring the meter itself).  You can write it on any piece of paper.  please call us sooner if your blood sugar goes below 70, or if most of your readings are over 200.   Please come back for a follow-up appointment in 1 month.

## 2021-10-16 NOTE — Patient Instructions (Addendum)
We are asking your facility to send Kurt Aguilar the blood sugar record.  Based on this, we'll probably change the insulin back to 70/30, 40 units with breakfast.    Please call or message Kurt Aguilar next week, to tell Kurt Aguilar how the blood sugar is doing.   check your blood sugar twice a day.  vary the time of day when you check, between before the 3 meals, and at bedtime.  also check if you have symptoms of your blood sugar being too high or too low.  please keep a record of the readings and bring it to your next appointment here (or you can bring the meter itself).  You can write it on any piece of paper.  please call Kurt Aguilar sooner if your blood sugar goes below 70, or if most of your readings are over 200.   Please come back for a follow-up appointment in 1 month.

## 2021-10-17 ENCOUNTER — Other Ambulatory Visit (INDEPENDENT_AMBULATORY_CARE_PROVIDER_SITE_OTHER): Payer: PPO

## 2021-10-17 DIAGNOSIS — I25708 Atherosclerosis of coronary artery bypass graft(s), unspecified, with other forms of angina pectoris: Secondary | ICD-10-CM

## 2021-10-18 ENCOUNTER — Other Ambulatory Visit: Payer: Self-pay

## 2021-10-18 DIAGNOSIS — E1165 Type 2 diabetes mellitus with hyperglycemia: Secondary | ICD-10-CM

## 2021-10-18 LAB — BASIC METABOLIC PANEL
BUN/Creatinine Ratio: 26 — ABNORMAL HIGH (ref 10–24)
BUN: 51 mg/dL — ABNORMAL HIGH (ref 8–27)
CO2: 18 mmol/L — ABNORMAL LOW (ref 20–29)
Calcium: 9.1 mg/dL (ref 8.6–10.2)
Chloride: 102 mmol/L (ref 96–106)
Creatinine, Ser: 1.94 mg/dL — ABNORMAL HIGH (ref 0.76–1.27)
Glucose: 305 mg/dL — ABNORMAL HIGH (ref 70–99)
Potassium: 4.2 mmol/L (ref 3.5–5.2)
Sodium: 138 mmol/L (ref 134–144)
eGFR: 35 mL/min/{1.73_m2} — ABNORMAL LOW (ref >59)

## 2021-10-18 MED ORDER — FREESTYLE LIBRE 2 READER DEVI: 0 refills | Status: DC

## 2021-10-21 ENCOUNTER — Telehealth: Payer: Self-pay | Admitting: Medical

## 2021-10-21 ENCOUNTER — Telehealth: Payer: Self-pay

## 2021-10-21 DIAGNOSIS — N1832 Chronic kidney disease, stage 3b: Secondary | ICD-10-CM

## 2021-10-21 DIAGNOSIS — R600 Localized edema: Secondary | ICD-10-CM

## 2021-10-21 DIAGNOSIS — Z79899 Other long term (current) drug therapy: Secondary | ICD-10-CM

## 2021-10-21 MED ORDER — FUROSEMIDE 20 MG PO TABS: 10.0000 mg | ORAL_TABLET | Freq: Every day | ORAL | 3 refills | Status: DC

## 2021-10-21 NOTE — Telephone Encounter (Signed)
Spoke with pt inregards to his BS. He stated that his BS have been ranging from 477 highest and 202being the lowest. He stated that it is mostly higher in the afternoons ranging from 317 down to 217 and this is after eating. He is on Novolin 70/30

## 2021-10-21 NOTE — Telephone Encounter (Signed)
Antony Madura, PA-C  Sent: Mon October 21, 2021  2:54 PM  To: Emily Filbert, RN          Message  Thanks

## 2021-10-21 NOTE — Telephone Encounter (Signed)
Patient has now been informed to increase insulin to 50 units with breakfast. Patient expressed understanding.

## 2021-10-21 NOTE — Telephone Encounter (Signed)
Cadence Ninfa Meeker, PA-C  10/18/2021 10:10 AM EST     Labs show some mild dehydration. I would go down to lasix 10mg  daily. BMET in a week. Can we ask how his lower leg swelling is?

## 2021-10-21 NOTE — Telephone Encounter (Signed)
I spoke with the patient regarding his lab results/ PA recommendations to: 1) Decrease lasix to 10 mg once daily  2) Repeat a BMP in 1 week  The patient voices understanding of these results and recommendations and is agreeable.  I inquired how his swelling is doing on the current dose of lasix. He advised he is wearing compression and this seems to be helping. He did not notice a big change in her urine output on the lasix 20 mg once daily dosing.  He is aware I will forward to Cadence, PA as a FYI.  The patient will come on 10/28/21 at 1:45 pm for lab work in the office.  BMP order placed.

## 2021-10-22 DIAGNOSIS — J8489 Other specified interstitial pulmonary diseases: Secondary | ICD-10-CM | POA: Diagnosis not present

## 2021-10-22 DIAGNOSIS — N1832 Chronic kidney disease, stage 3b: Secondary | ICD-10-CM | POA: Diagnosis not present

## 2021-10-22 DIAGNOSIS — I214 Non-ST elevation (NSTEMI) myocardial infarction: Secondary | ICD-10-CM | POA: Diagnosis not present

## 2021-10-22 DIAGNOSIS — M6281 Muscle weakness (generalized): Secondary | ICD-10-CM | POA: Diagnosis not present

## 2021-10-22 DIAGNOSIS — I2510 Atherosclerotic heart disease of native coronary artery without angina pectoris: Secondary | ICD-10-CM | POA: Diagnosis not present

## 2021-10-22 DIAGNOSIS — R2681 Unsteadiness on feet: Secondary | ICD-10-CM | POA: Diagnosis not present

## 2021-10-23 ENCOUNTER — Ambulatory Visit (INDEPENDENT_AMBULATORY_CARE_PROVIDER_SITE_OTHER): Payer: PPO | Admitting: Family Medicine

## 2021-10-23 ENCOUNTER — Other Ambulatory Visit: Payer: Self-pay

## 2021-10-23 ENCOUNTER — Encounter: Payer: Self-pay | Admitting: Family Medicine

## 2021-10-23 VITALS — BP 100/58 | HR 77 | Temp 97.5°F | Ht 70.25 in | Wt 231.4 lb

## 2021-10-23 DIAGNOSIS — N1832 Chronic kidney disease, stage 3b: Secondary | ICD-10-CM

## 2021-10-23 DIAGNOSIS — E785 Hyperlipidemia, unspecified: Secondary | ICD-10-CM | POA: Diagnosis not present

## 2021-10-23 DIAGNOSIS — I5032 Chronic diastolic (congestive) heart failure: Secondary | ICD-10-CM | POA: Diagnosis not present

## 2021-10-23 DIAGNOSIS — I2510 Atherosclerotic heart disease of native coronary artery without angina pectoris: Secondary | ICD-10-CM

## 2021-10-23 DIAGNOSIS — E1169 Type 2 diabetes mellitus with other specified complication: Secondary | ICD-10-CM | POA: Diagnosis not present

## 2021-10-23 DIAGNOSIS — R918 Other nonspecific abnormal finding of lung field: Secondary | ICD-10-CM | POA: Diagnosis not present

## 2021-10-23 DIAGNOSIS — I10 Essential (primary) hypertension: Secondary | ICD-10-CM | POA: Diagnosis not present

## 2021-10-23 DIAGNOSIS — I5033 Acute on chronic diastolic (congestive) heart failure: Secondary | ICD-10-CM | POA: Insufficient documentation

## 2021-10-23 NOTE — Assessment & Plan Note (Signed)
bp in fair control at this time  BP Readings from Last 1 Encounters:  10/23/21 (!) 100/58  in setting of CAD and some fluid overload No changes needed Most recent labs reviewed  Disc lifstyle change with low sodium diet Lasix 10 mg daily Metoprolol 50 mg bid Monitoring for hypotension

## 2021-10-23 NOTE — Assessment & Plan Note (Signed)
Under cardiology care In setting of CAD On low dose lasix and beta blocker Wearing supp hose for edema as welll

## 2021-10-23 NOTE — Assessment & Plan Note (Signed)
Recent ACS/ NSTEMI  Disc goals for lipids and reasons to control them Rev last labs with pt Rev low sat fat diet in detail  Taking crestor 40 mg daily and zetia 10 mg daily  Diet is fair/improving now  Planning fasting lab in about a month  LDL goal under 70 (was up to 74 last check)

## 2021-10-23 NOTE — Assessment & Plan Note (Signed)
Worse after covid Still taking prednisone and anxious to wean it

## 2021-10-23 NOTE — Patient Instructions (Addendum)
Keep doing your PT  Follow up with your specialists   Take care of yourself   Get your labs on Monday and see if they can add the cholesterol

## 2021-10-23 NOTE — Assessment & Plan Note (Signed)
Recent hosp for ACS with NSTEMI Is recovering  Tolerating metoprolol and lasix Is sob with exertion

## 2021-10-23 NOTE — Progress Notes (Signed)
Subjective:    Patient ID: Kurt Aguilar, male    DOB: 08-21-1946, 76 y.o.   MRN: [id number]  This visit occurred during the SARS-CoV-2 public health emergency.  Safety protocols were in place, including screening questions prior to the visit, additional usage of staff PPE, and extensive cleaning of exam room while observing appropriate contact time as indicated for disinfecting solutions.   HPI Pt presents for f/u of chronic health problems incl HTN and CKD  Wt Readings from Last 3 Encounters:  10/23/21 231 lb 6 oz (105 kg)  10/16/21 224 lb 3.2 oz (101.7 kg)  10/10/21 232 lb (105.2 kg)   32.96 kg/m  Feeling frustrated with current health events  Lots of PT however-this is helping / also OT   Was hosp for acute coronary syndrome with NSTEMI in janurary    Had a cath  (did with 1/2 contrast) Old grafts were fine  New blockage and had a calcif of a valve (bicuspid) with some backflow  Stable angina and chronic CHF   He is sob all the time    BP Readings from Last 3 Encounters:  10/23/21 (!) 100/58  10/16/21 126/70  10/10/21 130/62   Pulse Readings from Last 3 Encounters:  10/23/21 77  10/16/21 93  10/10/21 67     His renal #s worsened and then improved again by discharge  Lab Results  Component Value Date   CREATININE 1.94 (H) 10/17/2021   BUN 51 (H) 10/17/2021   NA 138 10/17/2021   K 4.2 10/17/2021   CL 102 10/17/2021   CO2 18 (L) 10/17/2021  Not on a fluid restriction currently    Struggling with ongoing cough (had covid in dec and BOOP) Did not meet criteria for  Chronic prednisone   Lab Results  Component Value Date   CHOL 154 04/09/2021   HDL 44.30 04/09/2021   LDLCALC 45 03/24/2019   LDLDIRECT 74.0 04/09/2021   TRIG 242.0 (H) 04/09/2021   CHOLHDL 3 04/09/2021  Crestor 40 mg daily  Zetia 10 mg daily   Diet is better now at home  Now getting off the fatty foods   DM2-chronic steroids Lab Results  Component Value Date   HGBA1C 9.5 (A)  09/13/2021   Battling with insulin   Cannot cut prednisone quite yet  Only sob with exertion    Patient Active Problem List   Diagnosis Date Noted   Chronic diastolic heart failure ([place]) 10/23/2021   AKI (acute kidney injury) ([employer]) 09/17/2021   Stage 3b chronic kidney disease (CKD) ([place]) 09/17/2021   Acute coronary syndrome ([place]) 09/17/2021   COVID-19 virus infection 08/26/2021   Osteopenia 05/16/2021   Steroid dependent ([place]) 04/12/2021   Primary osteoarthritis of left knee 05/29/2020   Colon cancer screening 02/13/2017   DM2 (diabetes mellitus, type 2) ([place]) 08/13/2016   Chronic heel pain, left 08/12/2016   Upper airway cough syndrome 07/31/2016   Class 1 obesity with serious comorbidity and body mass index (BMI) of 32.0 to 32.9 in adult 05/19/2016   Routine general medical examination at a health care facility 01/27/2016   Pulmonary infiltrates with high  ESR c/w BOOP/ idiopathic  12/19/2015   Fatigue 12/12/2015   Cough 11/29/2015   Cystic kidney disease 10/01/2015   Renal lesion 09/27/2015   Erectile dysfunction of organic origin 09/27/2015   BPH with obstruction/lower urinary tract symptoms 09/27/2015   Constipation 09/24/2015   Cyst of right kidney 09/24/2015   CAD (coronary artery disease) 06/08/2015  Grieving 11/22/2014   Encounter for Medicare annual wellness exam 07/11/2013   Prostate cancer screening 07/03/2013   Right bundle branch block 02/22/2013   Chronic low back pain 05/13/2011   Obstructive sleep apnea 03/15/2010   Hyperlipidemia associated with type 2 diabetes mellitus ([place]) 04/12/2009   Gout 04/12/2009   Essential hypertension 04/12/2009   Coronary artery disease of bypass graft of native heart with stable angina pectoris ([place]) 04/12/2009   GERD 04/12/2009   Renal insufficiency 04/12/2009   DIVERTICULITIS, HX OF 04/12/2009   Past Medical History:  Diagnosis Date   Arthritis    Chronic kidney disease    Complication of anesthesia     constipation and inability to  urinate happened a few days after cataract surgery    Diabetes ([place])    type 2    Diverticulitis    GERD (gastroesophageal reflux disease)    Gout    Heart attack ([place])    Heart disease    Heart murmur    Hyperglycemia    Hyperlipidemia    Hypertension    Osteoporosis    Pneumonia    hx of BOOP followed by Dr [name]    Sleep apnea    cpapp - setting at 15    Past Surgical History:  Procedure Laterality Date   ACROMIO-CLAVICULAR JOINT REPAIR Left 05/05/2019   Procedure: SHOULDER ARTHROSCOPIC ASSISTED ACROMIO-CLAVICULAR JOINT REPAIR;  Surgeon: Tania Ade, MD;  Location: [facility];  Service: Orthopedics;  Laterality: Left;   CATARACT EXTRACTION Bilateral    CORONARY ARTERY BYPASS GRAFT     [place]   LEFT HEART CATH AND CORS/GRAFTS ANGIOGRAPHY N/A 09/19/2021   Procedure: LEFT HEART CATH AND CORS/GRAFTS ANGIOGRAPHY;  Surgeon: Belva Crome, MD;  Location: [facility];  Service: Cardiovascular;  Laterality: N/A;   open heart surgery  09/09/1995-1998   5 bypass   REPAIR KNEE LIGAMENT     right   SHOULDER ARTHROSCOPY Left 05/05/2019   Procedure: ARTHROSCOPY SHOULDER;  Surgeon: Tania Ade, MD;  Location: [facility];  Service: Orthopedics;  Laterality: Left;   TONSILLECTOMY     TOTAL KNEE ARTHROPLASTY Left 05/29/2020   Procedure: LEFT TOTAL KNEE ARTHROPLASTY;  Surgeon: Melrose Nakayama, MD;  Location: [facility];  Service: Orthopedics;  Laterality: Left;   Social History   Tobacco Use   Smoking status: Former    Packs/day: 1.00    Years: 3.00    Pack years: 3.00    Types: Cigars, Cigarettes    Quit date: 11/25/2004    Years since quitting: 16.9   Smokeless tobacco: Never   Tobacco comments:    occas. cigar quit 2006  Vaping Use   Vaping Use: Never used  Substance Use Topics   Alcohol use: Yes    Alcohol/week: 1.0 standard drink    Types: 1 Standard drinks or equivalent per week    Comment: rare   Drug use: No   Family History   Problem Relation Age of Onset   Heart disease Mother    Heart disease Father    Stroke Father    Heart attack Son 82   Kidney disease Neg Hx    Prostate cancer Neg Hx    Diabetes Neg Hx    Allergies  Allergen Reactions   Oxycodone-Acetaminophen Other (See Comments)    Stops breathing; has tolerated Tylenol before   Current Outpatient Medications on File Prior to Visit  Medication Sig Dispense Refill   acetaminophen (TYLENOL) 650 MG CR tablet Take 650  mg by mouth every 8 (eight) hours as needed for pain.     albuterol (VENTOLIN HFA) 108 (90 Base) MCG/ACT inhaler Inhale 2 puffs into the lungs every 6 (six) hours as needed for wheezing or shortness of breath. 8 g 2   allopurinol (ZYLOPRIM) 100 MG tablet Take 1 tablet (100 mg total) by mouth 2 (two) times daily. (Patient taking differently: Take 100 mg by mouth 2 (two) times daily. Patient states taking 75m daily) 180 tablet 3   aspirin 81 MG tablet Take 1 tablet (81 mg total) by mouth 2 (two) times daily after a meal. 30 tablet 0   benzonatate (TESSALON) 200 MG capsule Take 1 capsule (200 mg total) by mouth 3 (three) times daily as needed for cough. 30 capsule 1   Cholecalciferol (VITAMIN D3 PO) Take 1 capsule by mouth daily.     colchicine 0.6 MG tablet TAKE 1 TABLET BY MOUTH TWICE A DAY WITH A MEAL AS NEEDED FOR ACUTE FLARE OF GOUT 45 tablet 3   Continuous Blood Gluc Receiver (FREESTYLE LIBRE 2 READER) DEVI USE AS DIRECTED 1 each 0   Continuous Blood Gluc Sensor (FREESTYLE LIBRE 2 SENSOR) MISC 1 Device by Does not apply route every 14 (fourteen) days. 6 each 3   empagliflozin (JARDIANCE) 10 MG TABS tablet Take by mouth daily.     ezetimibe (ZETIA) 10 MG tablet Take 1 tablet (10 mg total) by mouth daily. 90 tablet 3   famotidine (PEPCID) 20 MG tablet TAKE 1 TABLET BY MOUTH EVERYDAY AT BEDTIME 90 tablet 1   furosemide (LASIX) 20 MG tablet Take 0.5 tablets (10 mg total) by mouth daily. 90 tablet 3   Glucosamine HCl (GLUCOSAMINE PO) Take  1,500 mg by mouth 2 (two) times daily.      glucose blood (FREESTYLE LITE) test strip 1 each by Other route 4 (four) times daily. E11.65 400 strip 0   HYDROcodone bit-homatropine (HYCODAN) 5-1.5 MG/5ML syrup Take 5 mLs by mouth every 6 (six) hours as needed for cough. 240 mL 0   insulin isophane & regular human KwikPen (NOVOLIN 70/30 KWIKPEN) (70-30) 100 UNIT/ML KwikPen Inject 20 Units into the skin daily with breakfast. And pen needles 1/day (Patient taking differently: Inject 50 Units into the skin daily with breakfast. And pen needles 1/day) 15 mL 11   INVOKANA 100 MG TABS tablet TAKE 1 TABLET BY MOUTH EVERY OTHER DAY 30 tablet 5   Lancets (FREESTYLE) lancets 1 EACH BY OTHER ROUTE 4 (FOUR) TIMES DAILY. E11.65 400 each 0   Lutein 20 MG CAPS Take 20 mg by mouth daily.      methocarbamol (ROBAXIN) 500 MG tablet Take 1 tablet (500 mg total) by mouth every 8 (eight) hours as needed for muscle spasms. Caution of sedation 40 tablet 1   metoprolol tartrate (LOPRESSOR) 50 MG tablet Take 1 tablet (50 mg total) by mouth 2 (two) times daily. 60 tablet 3   Multiple Vitamins-Minerals (ZINC PO) Take 1 tablet by mouth daily.     nitroGLYCERIN (NITROSTAT) 0.4 MG SL tablet Place 1 tablet (0.4 mg total) under the tongue every 5 (five) minutes x 3 doses as needed for chest pain. 30 tablet 12   omeprazole (PRILOSEC) 40 MG capsule TAKE 1 CAPUSLE BY MOUTH 30- 60 MIN BEFORE YOUR FIRST AND LAST MEALS OF THE DAY 180 capsule 3   ondansetron (ZOFRAN) 8 MG tablet Take 1 tablet (8 mg total) by mouth every 8 (eight) hours as needed for nausea or vomiting. From  narcotic pain medicine 15 tablet 0   polyethylene glycol (MIRALAX / GLYCOLAX) 17 g packet Take 17 g by mouth daily.     predniSONE (DELTASONE) 10 MG tablet Take prednisone 30 mg (3 tabs) for 3 days, then continue your maintenance dose of prednisone (Patient taking differently: Take 10 mg by mouth 2 (two) times daily with a meal.) 30 tablet 0   rosuvastatin (CRESTOR) 40 MG  tablet Take 1 tablet (40 mg total) by mouth daily. 30 tablet 3   No current facility-administered medications on file prior to visit.     Review of Systems  Constitutional:  Positive for fatigue. Negative for activity change, appetite change, fever and unexpected weight change.  HENT:  Negative for congestion, rhinorrhea, sore throat and trouble swallowing.   Eyes:  Negative for pain, redness, itching and visual disturbance.  Respiratory:  Positive for shortness of breath. Negative for cough, chest tightness and wheezing.   Cardiovascular:  Positive for leg swelling. Negative for chest pain and palpitations.  Gastrointestinal:  Negative for abdominal pain, blood in stool, constipation, diarrhea and nausea.  Endocrine: Negative for cold intolerance, heat intolerance, polydipsia and polyuria.  Genitourinary:  Negative for difficulty urinating, dysuria, frequency and urgency.  Musculoskeletal:  Negative for arthralgias, joint swelling and myalgias.  Skin:  Negative for pallor and rash.  Neurological:  Negative for dizziness, tremors, weakness, numbness and headaches.  Hematological:  Negative for adenopathy. Does not bruise/bleed easily.  Psychiatric/Behavioral:  Positive for dysphoric mood. Negative for decreased concentration. The patient is not nervous/anxious.       Objective:   Physical Exam Constitutional:      General: He is not in acute distress.    Appearance: Normal appearance. He is well-developed. He is obese. He is not ill-appearing or diaphoretic.  HENT:     Head: Normocephalic and atraumatic.  Eyes:     Conjunctiva/sclera: Conjunctivae normal.     Pupils: Pupils are equal, round, and reactive to light.  Neck:     Thyroid: No thyromegaly.     Vascular: No carotid bruit or JVD.  Cardiovascular:     Rate and Rhythm: Normal rate and regular rhythm.     Heart sounds: Normal heart sounds.    No gallop.  Pulmonary:     Effort: Pulmonary effort is normal. No respiratory  distress.     Breath sounds: Normal breath sounds. No wheezing or rales.  Abdominal:     General: Bowel sounds are normal. There is no distension or abdominal bruit.     Palpations: Abdomen is soft. There is no mass.     Tenderness: There is no abdominal tenderness.  Musculoskeletal:     Cervical back: Normal range of motion and neck supple.     Right lower leg: Edema present.     Left lower leg: Edema present.     Comments: Mild LE edema with pitting/wearing supp hose  Lymphadenopathy:     Cervical: No cervical adenopathy.  Skin:    General: Skin is warm and dry.     Coloration: Skin is not pale.     Findings: No rash.  Neurological:     Mental Status: He is alert.     Coordination: Coordination normal.     Deep Tendon Reflexes: Reflexes are normal and symmetric. Reflexes normal.  Psychiatric:        Mood and Affect: Mood normal.          Assessment & Plan:   Problem List Items Addressed This  Visit       Cardiovascular and Mediastinum   Essential hypertension (Chronic)    bp in fair control at this time  BP Readings from Last 1 Encounters:  10/23/21 (!) 100/58  in setting of CAD and some fluid overload No changes needed Most recent labs reviewed  Disc lifstyle change with low sodium diet Lasix 10 mg daily Metoprolol 50 mg bid Monitoring for hypotension       CAD (coronary artery disease)    Recent hosp for ACS with NSTEMI Is recovering  Tolerating metoprolol and lasix Is sob with exertion       Chronic diastolic heart failure ([place])    Under cardiology care In setting of CAD On low dose lasix and beta blocker Wearing supp hose for edema as welll        Respiratory   Pulmonary infiltrates with high  ESR c/w BOOP/ idiopathic     Worse after covid Still taking prednisone and anxious to wean it        Endocrine   Hyperlipidemia associated with type 2 diabetes mellitus ([place]) - Primary    Recent ACS/ NSTEMI  Disc goals for lipids and reasons to  control them Rev last labs with pt Rev low sat fat diet in detail  Taking crestor 40 mg daily and zetia 10 mg daily  Diet is fair/improving now  Planning fasting lab in about a month  LDL goal under 70 (was up to 74 last check)      Relevant Orders   Lipid panel     Genitourinary   Stage 3b chronic kidney disease (CKD) ([facility]) (Chronic)    Last cr stable at 1.94  Plans to continue nephrology care Now with CHF symptoms and on lasix, may be tricky to manage fluid balance

## 2021-10-23 NOTE — Assessment & Plan Note (Signed)
Last cr stable at 1.94  Plans to continue nephrology care Now with CHF symptoms and on lasix, may be tricky to manage fluid balance

## 2021-10-28 ENCOUNTER — Other Ambulatory Visit: Payer: PPO

## 2021-10-28 ENCOUNTER — Telehealth: Payer: Self-pay

## 2021-10-28 NOTE — Telephone Encounter (Signed)
Just spoke with pt and he said that he already taken the 50u so I told him to go ahead and add 15u to that and let us know how his BS are running in a couple of days.

## 2021-10-28 NOTE — Telephone Encounter (Signed)
Incoming message and I also spoke with the pt and he stated that his BS has been running high. In the mornings 200 and in the evenings 300. He is taking 50u of the Novolin 70/30.  Please Advise

## 2021-11-01 ENCOUNTER — Other Ambulatory Visit: Payer: PPO

## 2021-11-01 ENCOUNTER — Other Ambulatory Visit
Admission: RE | Admit: 2021-11-01 | Discharge: 2021-11-01 | Disposition: A | Discharge status: Home / Self Care | Payer: PPO | Attending: Medical | Admitting: Medical

## 2021-11-01 ENCOUNTER — Other Ambulatory Visit: Payer: Self-pay

## 2021-11-01 DIAGNOSIS — R600 Localized edema: Secondary | ICD-10-CM | POA: Insufficient documentation

## 2021-11-01 DIAGNOSIS — N1832 Chronic kidney disease, stage 3b: Secondary | ICD-10-CM | POA: Insufficient documentation

## 2021-11-01 DIAGNOSIS — Z79899 Other long term (current) drug therapy: Secondary | ICD-10-CM

## 2021-11-01 LAB — BASIC METABOLIC PANEL
Anion gap: 10 (ref 5–15)
BUN: 43 mg/dL — ABNORMAL HIGH (ref 8–23)
CO2: 23 mmol/L (ref 22–32)
Calcium: 8.8 mg/dL — ABNORMAL LOW (ref 8.9–10.3)
Chloride: 103 mmol/L (ref 98–111)
Creatinine, Ser: 2.18 mg/dL — ABNORMAL HIGH (ref 0.61–1.24)
GFR, Estimated: 30 mL/min — ABNORMAL LOW (ref >60)
Glucose, Bld: 231 mg/dL — ABNORMAL HIGH (ref 70–99)
Potassium: 4.5 mmol/L (ref 3.5–5.1)
Sodium: 136 mmol/L (ref 135–145)

## 2021-11-04 ENCOUNTER — Telehealth: Payer: Self-pay | Admitting: Emergency Medicine

## 2021-11-04 NOTE — Telephone Encounter (Signed)
-----   Message from [place], PA-C sent at 11/04/2021  1:20 PM EST ----- Kidney function mildly elevated, lets decrease to lasix 10mg  daily. BMET in a week.

## 2021-11-04 NOTE — Telephone Encounter (Signed)
Patient currently taking 10 mg of lasix daily per chart, spoke with Cadence Kathlen Mody, PA-C and she stated for patient to take lasix every other day.   Spoke with patient. Reviewed results and recommendations with him. Patient verbalized understanding. Patient will be following up with Dr. [name] on Monday 3/6, will let Dr. [name] decide if he wants [facility] as we do not have anyone in lab that day.

## 2021-11-06 DIAGNOSIS — M6281 Muscle weakness (generalized): Secondary | ICD-10-CM | POA: Diagnosis not present

## 2021-11-06 DIAGNOSIS — J8489 Other specified interstitial pulmonary diseases: Secondary | ICD-10-CM | POA: Diagnosis not present

## 2021-11-06 DIAGNOSIS — I214 Non-ST elevation (NSTEMI) myocardial infarction: Secondary | ICD-10-CM | POA: Diagnosis not present

## 2021-11-06 DIAGNOSIS — N1832 Chronic kidney disease, stage 3b: Secondary | ICD-10-CM | POA: Diagnosis not present

## 2021-11-06 DIAGNOSIS — R2681 Unsteadiness on feet: Secondary | ICD-10-CM | POA: Diagnosis not present

## 2021-11-06 DIAGNOSIS — I2510 Atherosclerotic heart disease of native coronary artery without angina pectoris: Secondary | ICD-10-CM | POA: Diagnosis not present

## 2021-11-10 NOTE — Progress Notes (Signed)
Cardiology Office Note  Date:  11/11/2021   ID:  Kurt Aguilar, DOB 1946-02-01, MRN [id number]  PCP:  [name], MD   Chief Complaint  Patient presents with   3 week follow up     Patient c/o LE edema, chest pain and shortness of breath with little to no exertion. Medications reviewed by the patient verbally.     HPI:  76 yo with history of  CAD,  CABG back in 1997 in [name], [place],  HTN,  Diabetes with chronic renal insufficiency hyperlipidemia,  gout baseline creatinine mildly elevated He lost his son in early 2016 to cardiac arrest Aortic valve murmur/aortic valve disease h/o reactive airway symptoms, Boop,  presents for routine followup of his coronary artery disease, CRI   LOV 8/22  COVID-19 in December (positive on 12/4), underlying history of BOOP  Recent hospitalization January 2023, Non-STEMI Cardiac catheterization performed multivessel native coronary artery diseasesaphenous vein to right coronary artery with 70% ostial to proximal stenosis, SVG to OM 1 and 2 patent, SVG to first diagonal patent, LIMA to LAD patent.   The saphenous vein graft to the right coronary can be stented although it is not so critical that it should be causing enzyme elevation.  Enzyme elevation likely related to left coronary native disease in the septal perforator territory.  Echo 09/18/21 1. Left ventricular ejection fraction, by estimation, is 60 to 65%. The  left ventricle has normal function. The left ventricle has no regional  wall motion abnormalities. Left ventricular diastolic parameters are  consistent with Grade I diastolic  dysfunction (impaired relaxation). Elevated left ventricular end-diastolic  pressure.   2. Right ventricular systolic function is moderately reduced. The right  ventricular size is normal.   3. The mitral valve is normal in structure. No evidence of mitral valve  regurgitation. No evidence of mitral stenosis.   4. The AV is poorly visualized to  comment on structure. There does appear  to be come calcification of the valve. . The aortic valve was not well  visualized. Aortic valve regurgitation is not visualized. No aortic  stenosis is present.   In the hospital treated with prednisone, nebs, cough suppressant  SOB on exertion, ok at rest  doing PT 3x a week, rides bike Slow improvement  Lab work reviewed Significant jump in A1c 6.5 now up to 9.12 September 2021 Followed by Dr. Loanne Drilling Creatinine 2.18 BUN 43  Denies any chest pain concerning for angina, no significant shortness of breath  [facility] facility [facility]  On prednisone 20 mg daily  EKG personally reviewed by myself on todays visit Shows normal sinus rhythm rate 59 bpm right bundle branch block no other significant ST-T wave changes  Other past medical history reviewed h/o reactive airway symptoms, Boop PreviouslyStarted on prednisoneMarch 2017  for shortness of breath, improvement of symptoms,  cough returned after prednisone was discontinued getting close To his baseline Minimal wheezing and cough predisone 5 mg daily, Plan may be to wean the dose Managed by Dr. [name] , Pulmonary  Other major issue was hemoglobin A1c going up to 11 Started On insulin, daily in Am     PMH:   has a past medical history of Arthritis, Chronic kidney disease, Complication of anesthesia, Diabetes ([place]), Diverticulitis, GERD (gastroesophageal reflux disease), Gout, Heart attack ([place]), Heart disease, Heart murmur, Hyperglycemia, Hyperlipidemia, Hypertension, Osteoporosis, Pneumonia, and Sleep apnea.  PSH:    Past Surgical History:  Procedure Laterality Date   ACROMIO-CLAVICULAR JOINT REPAIR  Left 05/05/2019   Procedure: SHOULDER ARTHROSCOPIC ASSISTED ACROMIO-CLAVICULAR JOINT REPAIR;  Surgeon: Tania Ade, MD;  Location: [facility];  Service: Orthopedics;  Laterality: Left;   CATARACT EXTRACTION Bilateral    CORONARY ARTERY BYPASS GRAFT     [place]   LEFT  HEART CATH AND CORS/GRAFTS ANGIOGRAPHY N/A 09/19/2021   Procedure: LEFT HEART CATH AND CORS/GRAFTS ANGIOGRAPHY;  Surgeon: Belva Crome, MD;  Location: [facility];  Service: Cardiovascular;  Laterality: N/A;   open heart surgery  09/09/1995-1998   5 bypass   REPAIR KNEE LIGAMENT     right   SHOULDER ARTHROSCOPY Left 05/05/2019   Procedure: ARTHROSCOPY SHOULDER;  Surgeon: Tania Ade, MD;  Location: [facility];  Service: Orthopedics;  Laterality: Left;   TONSILLECTOMY     TOTAL KNEE ARTHROPLASTY Left 05/29/2020   Procedure: LEFT TOTAL KNEE ARTHROPLASTY;  Surgeon: Melrose Nakayama, MD;  Location: [facility];  Service: Orthopedics;  Laterality: Left;    Current Outpatient Medications  Medication Sig Dispense Refill   acetaminophen (TYLENOL) 650 MG CR tablet Take 650 mg by mouth every 8 (eight) hours as needed for pain.     albuterol (VENTOLIN HFA) 108 (90 Base) MCG/ACT inhaler Inhale 2 puffs into the lungs every 6 (six) hours as needed for wheezing or shortness of breath. 8 g 2   allopurinol (ZYLOPRIM) 100 MG tablet Take 1 tablet (100 mg total) by mouth 2 (two) times daily. (Patient taking differently: Take 100 mg by mouth 2 (two) times daily. Patient states taking '80mg'$  daily) 180 tablet 3   aspirin 81 MG tablet Take 1 tablet (81 mg total) by mouth 2 (two) times daily after a meal. 30 tablet 0   benzonatate (TESSALON) 200 MG capsule Take 1 capsule (200 mg total) by mouth 3 (three) times daily as needed for cough. 30 capsule 1   colchicine 0.6 MG tablet TAKE 1 TABLET BY MOUTH TWICE A DAY WITH A MEAL AS NEEDED FOR ACUTE FLARE OF GOUT 45 tablet 3   ezetimibe (ZETIA) 10 MG tablet Take 1 tablet (10 mg total) by mouth daily. 90 tablet 3   famotidine (PEPCID) 20 MG tablet TAKE 1 TABLET BY MOUTH EVERYDAY AT BEDTIME 90 tablet 1   furosemide (LASIX) 20 MG tablet Take 0.5 tablets (10 mg total) by mouth daily. 90 tablet 3   Glucosamine HCl (GLUCOSAMINE PO) Take 1,500 mg by mouth 2 (two) times daily.       HYDROcodone bit-homatropine (HYCODAN) 5-1.5 MG/5ML syrup Take 5 mLs by mouth every 6 (six) hours as needed for cough. 240 mL 0   insulin isophane & regular human KwikPen (NOVOLIN 70/30 KWIKPEN) (70-30) 100 UNIT/ML KwikPen Inject 20 Units into the skin daily with breakfast. And pen needles 1/day (Patient taking differently: Inject 65 Units into the skin daily with breakfast. And pen needles 1/day) 15 mL 11   INVOKANA 100 MG TABS tablet TAKE 1 TABLET BY MOUTH EVERY OTHER DAY (Patient taking differently: Take 50 mg by mouth daily before breakfast.) 30 tablet 5   Lutein 20 MG CAPS Take 20 mg by mouth daily.      metoprolol tartrate (LOPRESSOR) 50 MG tablet Take 1 tablet (50 mg total) by mouth 2 (two) times daily. 60 tablet 3   nitroGLYCERIN (NITROSTAT) 0.4 MG SL tablet Place 1 tablet (0.4 mg total) under the tongue every 5 (five) minutes x 3 doses as needed for chest pain. 30 tablet 12   omeprazole (PRILOSEC) 40 MG capsule TAKE 1 CAPUSLE BY  MOUTH 30- 60 MIN BEFORE YOUR FIRST AND LAST MEALS OF THE DAY 180 capsule 3   polyethylene glycol (MIRALAX / GLYCOLAX) 17 g packet Take 17 g by mouth daily.     predniSONE (DELTASONE) 10 MG tablet Take prednisone 30 mg (3 tabs) for 3 days, then continue your maintenance dose of prednisone (Patient taking differently: Take 10 mg by mouth 2 (two) times daily with a meal.) 30 tablet 0   rosuvastatin (CRESTOR) 40 MG tablet Take 1 tablet (40 mg total) by mouth daily. 30 tablet 3   Cholecalciferol (VITAMIN D3 PO) Take 1 capsule by mouth daily. (Patient not taking: Reported on 11/11/2021)     Continuous Blood Gluc Receiver (FREESTYLE LIBRE 2 READER) DEVI USE AS DIRECTED (Patient not taking: Reported on 11/11/2021) 1 each 0   Continuous Blood Gluc Sensor (FREESTYLE LIBRE 2 SENSOR) MISC 1 Device by Does not apply route every 14 (fourteen) days. (Patient not taking: Reported on 11/11/2021) 6 each 3   empagliflozin (JARDIANCE) 10 MG TABS tablet Take by mouth daily. (Patient not taking:  Reported on 11/11/2021)     glucose blood (FREESTYLE LITE) test strip 1 each by Other route 4 (four) times daily. E11.65 (Patient not taking: Reported on 11/11/2021) 400 strip 0   Lancets (FREESTYLE) lancets 1 EACH BY OTHER ROUTE 4 (FOUR) TIMES DAILY. E11.65 (Patient not taking: Reported on 11/11/2021) 400 each 0   methocarbamol (ROBAXIN) 500 MG tablet Take 1 tablet (500 mg total) by mouth every 8 (eight) hours as needed for muscle spasms. Caution of sedation (Patient not taking: Reported on 11/11/2021) 40 tablet 1   Multiple Vitamins-Minerals (ZINC PO) Take 1 tablet by mouth daily. (Patient not taking: Reported on 11/11/2021)     ondansetron (ZOFRAN) 8 MG tablet Take 1 tablet (8 mg total) by mouth every 8 (eight) hours as needed for nausea or vomiting. From narcotic pain medicine (Patient not taking: Reported on 11/11/2021) 15 tablet 0   No current facility-administered medications for this visit.     Allergies:   Oxycodone-acetaminophen   Social History:  The patient  reports that he quit smoking about 16 years ago. His smoking use included cigars and cigarettes. He has a 3.00 pack-year smoking history. He has never used smokeless tobacco. He reports current alcohol use of about 1.0 standard drink per week. He reports that he does not use drugs.   Family History:   family history includes Heart attack (age of onset: 108) in his son; Heart disease in his father and mother; Stroke in his father.    Review of Systems: Review of Systems  Constitutional: Negative.   HENT: Negative.    Respiratory:  Positive for shortness of breath.   Cardiovascular:  Positive for chest pain.  Gastrointestinal: Negative.   Musculoskeletal: Negative.   Neurological: Negative.   Psychiatric/Behavioral: Negative.    All other systems reviewed and are negative.  PHYSICAL EXAM: VS:  BP (!) 150/90 (BP Location: Left Arm, Patient Position: Sitting, Cuff Size: Normal)    Pulse (!) 59    Ht 5' 10.25" (1.784 m)    Wt 231 lb 2 oz  (104.8 kg)    SpO2 98%    BMI 32.93 kg/m  , BMI Body mass index is 32.93 kg/m.  Constitutional:  oriented to person, place, and time. No distress.  HENT:  Head: Grossly normal Eyes:  no discharge. No scleral icterus.  Neck: No JVD, no carotid bruits  Cardiovascular: Regular rate and rhythm, no murmurs appreciated Pulmonary/Chest:  Moderately decreased breath sounds throughout, scattered Rales Abdominal: Soft.  no distension.  no tenderness.  Musculoskeletal: Normal range of motion Neurological:  normal muscle tone. Coordination normal. No atrophy Skin: Skin warm and dry Psychiatric: normal affect, pleasant    Recent Labs: 04/09/2021: TSH 3.86 09/17/2021: ALT 36; B Natriuretic Peptide 37.7 09/20/2021: Hemoglobin 11.4; Platelets 123 11/01/2021: BUN 43; Creatinine, Ser 2.18; Potassium 4.5; Sodium 136    Lipid Panel Lab Results  Component Value Date   CHOL 154 04/09/2021   HDL 44.30 04/09/2021   LDLCALC 45 03/24/2019   TRIG 242.0 (H) 04/09/2021    Wt Readings from Last 3 Encounters:  11/11/21 231 lb 2 oz (104.8 kg)  10/23/21 231 lb 6 oz (105 kg)  10/16/21 224 lb 3.2 oz (101.7 kg)     ASSESSMENT AND PLAN:  Coronary artery disease with stable angina, bypass Recent cardiac catheterization reviewed with him in detail We will add isosorbide mononitrate 30 mg daily Discussed indications that might be to stent placement to proximal vein graft to the right  Pure hypercholesterolemia - Plan: EKG 12-Lead Numbers likely running high from poor controlled diabetes A1c 9.5 Discussed working closely with endocrine to get A1c down  Essential hypertension - Plan: EKG 12-Lead Blood pressure running high here and at home Recommend he add isosorbide 30 mg daily, closely monitor blood pressure and call us with numbers  Chronic moderate shortness of breath,  Recovering after recent hospitalization, COPD exacerbation/Boop  Controlled type 2 diabetes mellitus with diabetic nephropathy,  unspecified long term insulin use status ([place]) - Numbers higher in the setting of long-term prednisone use Working with endocrine  Morbid obesity due to excess calories ([place]) in setting of pred rx  -  We have encouraged continued exercise, careful diet management in an effort to lose weight.    Total encounter time more than 40 minutes  Greater than 50% was spent in counseling and coordination of care with the patient      Orders Placed This Encounter  Procedures   EKG 12-Lead      Signed, Esmond Plants, M.D., Ph.D. 11/11/2021  [place], [place]

## 2021-11-11 ENCOUNTER — Encounter: Payer: Self-pay | Admitting: Cardiovascular Disease

## 2021-11-11 ENCOUNTER — Other Ambulatory Visit: Payer: Self-pay

## 2021-11-11 ENCOUNTER — Ambulatory Visit: Payer: PPO | Admitting: Cardiovascular Disease

## 2021-11-11 VITALS — BP 150/90 | HR 59 | Ht 70.25 in | Wt 231.1 lb

## 2021-11-11 DIAGNOSIS — E782 Mixed hyperlipidemia: Secondary | ICD-10-CM | POA: Diagnosis not present

## 2021-11-11 DIAGNOSIS — N1832 Chronic kidney disease, stage 3b: Secondary | ICD-10-CM

## 2021-11-11 DIAGNOSIS — R600 Localized edema: Secondary | ICD-10-CM | POA: Diagnosis not present

## 2021-11-11 DIAGNOSIS — I25708 Atherosclerosis of coronary artery bypass graft(s), unspecified, with other forms of angina pectoris: Secondary | ICD-10-CM

## 2021-11-11 DIAGNOSIS — I10 Essential (primary) hypertension: Secondary | ICD-10-CM

## 2021-11-11 DIAGNOSIS — G4733 Obstructive sleep apnea (adult) (pediatric): Secondary | ICD-10-CM | POA: Diagnosis not present

## 2021-11-11 DIAGNOSIS — I5032 Chronic diastolic (congestive) heart failure: Secondary | ICD-10-CM

## 2021-11-11 MED ORDER — ISOSORBIDE MONONITRATE ER 30 MG PO TB24: 30.0000 mg | ORAL_TABLET | Freq: Every day | ORAL | 3 refills | Status: DC

## 2021-11-11 NOTE — Patient Instructions (Addendum)
Medication Instructions:  ?Please start ? imdur/isosorbide 30 mg daily ? ?If you need a refill on your cardiac medications before your next appointment, please call your pharmacy.  ? ?Lab work: ?No new labs needed ? ?Testing/Procedures: ?No new testing needed ? ?Follow-Up: ?At [facility], you and your health needs are our priority.  As part of our continuing mission to provide you with exceptional heart care, we have created designated Provider Care Teams.  These Care Teams include your primary Cardiologist (physician) and Advanced Practice Providers (APPs -  Physician Assistants and Nurse Practitioners) who all work together to provide you with the care you need, when you need it. ? ?You will need a follow up appointment in 6 months ? ?Providers on your designated Care Team:   ?Murray Hodgkins, NP ?Christell Faith, PA-C ?Cadence Kathlen Mody, PA-C ? ?COVID-19 Vaccine Information can be found at: [email or web address] For questions related to vaccine distribution or appointments, please email [email or web address] or call [phone].  ? ?Please monitor blood pressures and keep a log of your readings.  ?Call the clinic in 2 weeks with BP readings or may upload to your MyChart account for review ? ?How to use a home blood pressure monitor. ?Be still. ?Measure at the same time every day. It?s important to take the readings at the same time each day, such as morning and evening. Take reading approximately 1 1/2 to 2 hours after BP medications. ? ? AVOID these things for 30 minutes before checking your blood pressure: ?Drinking caffeine. ?Drinking alcohol. ?Eating. ?Smoking. ?Exercising. ?  ?Five minutes before checking your blood pressure: ?Pee. ?Sit in a dining chair. Avoid sitting in a soft couch or armchair. ?Be quiet. Do not talk. ?   ?   Sit correctly. Sit with your back straight and supported (on a dining chair, rather than a sofa). Your feet should be flat on  the floor and your legs should not be crossed. Your arm should be supported on a flat surface (such as a table) with the upper arm at heart level. Make sure the bottom of the cuff is placed directly above the bend of the elbow.  ? ?

## 2021-11-14 ENCOUNTER — Ambulatory Visit: Payer: PPO | Admitting: Endocrinology

## 2021-11-14 ENCOUNTER — Other Ambulatory Visit: Payer: Self-pay

## 2021-11-14 VITALS — BP 130/86 | HR 75 | Ht 70.25 in | Wt 235.8 lb

## 2021-11-14 DIAGNOSIS — E1122 Type 2 diabetes mellitus with diabetic chronic kidney disease: Secondary | ICD-10-CM

## 2021-11-14 DIAGNOSIS — N1832 Chronic kidney disease, stage 3b: Secondary | ICD-10-CM | POA: Diagnosis not present

## 2021-11-14 DIAGNOSIS — E1165 Type 2 diabetes mellitus with hyperglycemia: Secondary | ICD-10-CM | POA: Diagnosis not present

## 2021-11-14 LAB — POCT GLYCOSYLATED HEMOGLOBIN (HGB A1C): Hemoglobin A1C: 8 % — AB (ref 4.0–5.6)

## 2021-11-14 MED ORDER — OZEMPIC (0.25 OR 0.5 MG/DOSE) 2 MG/1.5ML ~~LOC~~ SOPN: 0.5000 mg | PEN_INJECTOR | SUBCUTANEOUS | 3 refills | Status: DC

## 2021-11-14 MED ORDER — NOVOLIN 70/30 FLEXPEN (70-30) 100 UNIT/ML ~~LOC~~ SUPN: 45.0000 [IU] | PEN_INJECTOR | Freq: Every day | SUBCUTANEOUS | 3 refills | Status: DC

## 2021-11-14 NOTE — Patient Instructions (Addendum)
I have sent a prescription to your pharmacy, to add Ozempic.   ?Please reduce the 70/30 insulin to 45 units with breakfast.   ?check your blood sugar twice a day.  vary the time of day when you check, between before the 3 meals, and at bedtime.  also check if you have symptoms of your blood sugar being too high or too low.  please keep a record of the readings and bring it to your next appointment here (or you can bring the meter itself).  You can write it on any piece of paper.  please call us sooner if your blood sugar goes below 70, or if most of your readings are over 200.   ?Please call when you run low on the Invokana, so we can change to [place].   ?Please come back for a follow-up appointment in 1 month.   ?

## 2021-11-14 NOTE — Progress Notes (Signed)
Subjective:    Patient ID: Kurt Aguilar, male    DOB: 09-May-1946, 76 y.o.   MRN: [id number]  HPI Pt returns for f/u of diabetes mellitus:  DM type: Insulin-requiring type 2 Dx'ed: 9242 Complications: PN, stage 3b CRI, and CAD.   Therapy: insulin since 2023, and Invokana.   DKA: never.  Severe hypoglycemia: never.  Pancreatitis: never.  Other: Kurt Aguilar takes intermitt prednisone for BOOP; renal insuff limits rx options; Kurt Aguilar also took insulin 2018-2019.   Interval history: Kurt Aguilar is home now.  Meter is downloaded today, and the printout is scanned into the record.  cbg's vary from 105-400.  It is in general higher as the day goes on.  Kurt Aguilar takes 70/30 insulin, 65 units qam.  Ins prefers Jardiance over [place] Past Medical History:  Diagnosis Date   Arthritis    Chronic kidney disease    Complication of anesthesia    constipation and inability to  urinate happened a few days after cataract surgery    Diabetes ([place])    type 2    Diverticulitis    GERD (gastroesophageal reflux disease)    Gout    Heart attack ([place])    Heart disease    Heart murmur    Hyperglycemia    Hyperlipidemia    Hypertension    Osteoporosis    Pneumonia    hx of BOOP followed by Dr [name]    Sleep apnea    cpapp - setting at 17     Past Surgical History:  Procedure Laterality Date   ACROMIO-CLAVICULAR JOINT REPAIR Left 05/05/2019   Procedure: SHOULDER ARTHROSCOPIC ASSISTED ACROMIO-CLAVICULAR JOINT REPAIR;  Surgeon: Tania Ade, MD;  [facility];  Service: Orthopedics;  Laterality: Left;   CATARACT EXTRACTION Bilateral    CORONARY ARTERY BYPASS GRAFT     [place]   LEFT HEART CATH AND CORS/GRAFTS ANGIOGRAPHY N/A 09/19/2021   Procedure: LEFT HEART CATH AND CORS/GRAFTS ANGIOGRAPHY;  Surgeon: Belva Crome, MD;  Location: [facility];  Service: Cardiovascular;  Laterality: N/A;   open heart surgery  09/09/1995-1998   5 bypass   REPAIR KNEE LIGAMENT     right   SHOULDER ARTHROSCOPY Left  05/05/2019   Procedure: ARTHROSCOPY SHOULDER;  Surgeon: Tania Ade, MD;  [facility];  Service: Orthopedics;  Laterality: Left;   TONSILLECTOMY     TOTAL KNEE ARTHROPLASTY Left 05/29/2020   Procedure: LEFT TOTAL KNEE ARTHROPLASTY;  Surgeon: Melrose Nakayama, MD;  [facility];  Service: Orthopedics;  Laterality: Left;    Social History   Socioeconomic History   Marital status: Married    Spouse name: Not on file   Number of children: 4   Years of education: 14   Highest education level: Not on file  Occupational History   Occupation: Regulatory affairs officer    Employer: RETIRED  Tobacco Use   Smoking status: Former    Packs/day: 1.00    Years: 3.00    Pack years: 3.00    Types: Cigars, Cigarettes    Quit date: 11/25/2004    Years since quitting: 16.9   Smokeless tobacco: Never   Tobacco comments:    occas. cigar quit 2006  Vaping Use   Vaping Use: Never used  Substance and Sexual Activity   Alcohol use: Yes    Alcohol/week: 1.0 standard drink    Types: 1 Standard drinks or equivalent per week    Comment: rare   Drug use: No   Sexual activity: Never  Other Topics Concern   Not on file  Social History Narrative   Not on file   Social Determinants of Health   Financial Resource Strain: Low Risk    Difficulty of Paying Living Expenses: Not hard at all  Food Insecurity: No Food Insecurity   Worried About Charity fundraiser in the Last Year: Never true   [place] in the Last Year: Never true  Transportation Needs: No Transportation Needs   Lack of Transportation (Medical): No   Lack of Transportation (Non-Medical): No  Physical Activity: Sufficiently Active   Days of Exercise per Week: 4 days   Minutes of Exercise per Session: 50 min  Stress: No Stress Concern Present   Feeling of Stress : Not at all  Social Connections: Not on file  Intimate Partner Violence: Not At Risk   Fear of Current or Ex-Partner: No   Emotionally Abused: No   Physically  Abused: No   Sexually Abused: No    Current Outpatient Medications on File Prior to Visit  Medication Sig Dispense Refill   acetaminophen (TYLENOL) 650 MG CR tablet Take 650 mg by mouth every 8 (eight) hours as needed for pain.     albuterol (VENTOLIN HFA) 108 (90 Base) MCG/ACT inhaler Inhale 2 puffs into the lungs every 6 (six) hours as needed for wheezing or shortness of breath. 8 g 2   allopurinol (ZYLOPRIM) 100 MG tablet Take 1 tablet (100 mg total) by mouth 2 (two) times daily. (Patient taking differently: Take 100 mg by mouth 2 (two) times daily. Patient states taking '80mg'$  daily) 180 tablet 3   aspirin 81 MG tablet Take 1 tablet (81 mg total) by mouth 2 (two) times daily after a meal. 30 tablet 0   benzonatate (TESSALON) 200 MG capsule Take 1 capsule (200 mg total) by mouth 3 (three) times daily as needed for cough. 30 capsule 1   Cholecalciferol (VITAMIN D3 PO) Take 1 capsule by mouth daily.     colchicine 0.6 MG tablet TAKE 1 TABLET BY MOUTH TWICE A DAY WITH A MEAL AS NEEDED FOR ACUTE FLARE OF GOUT 45 tablet 3   Continuous Blood Gluc Receiver (FREESTYLE LIBRE 2 READER) DEVI USE AS DIRECTED 1 each 0   Continuous Blood Gluc Sensor (FREESTYLE LIBRE 2 SENSOR) MISC 1 Device by Does not apply route every 14 (fourteen) days. 6 each 3   empagliflozin (JARDIANCE) 10 MG TABS tablet Take by mouth daily.     ezetimibe (ZETIA) 10 MG tablet Take 1 tablet (10 mg total) by mouth daily. 90 tablet 3   famotidine (PEPCID) 20 MG tablet TAKE 1 TABLET BY MOUTH EVERYDAY AT BEDTIME 90 tablet 1   furosemide (LASIX) 20 MG tablet Take 0.5 tablets (10 mg total) by mouth daily. 90 tablet 3   Glucosamine HCl (GLUCOSAMINE PO) Take 1,500 mg by mouth 2 (two) times daily.      glucose blood (FREESTYLE LITE) test strip 1 each by Other route 4 (four) times daily. E11.65 400 strip 0   HYDROcodone bit-homatropine (HYCODAN) 5-1.5 MG/5ML syrup Take 5 mLs by mouth every 6 (six) hours as needed for cough. 240 mL 0   INVOKANA  100 MG TABS tablet TAKE 1 TABLET BY MOUTH EVERY OTHER DAY (Patient taking differently: Take 50 mg by mouth daily before breakfast.) 30 tablet 5   isosorbide mononitrate (IMDUR) 30 MG 24 hr tablet Take 1 tablet (30 mg total) by mouth daily. 90 tablet 3   Lancets (FREESTYLE)  lancets 1 EACH BY OTHER ROUTE 4 (FOUR) TIMES DAILY. E11.65 400 each 0   Lutein 20 MG CAPS Take 20 mg by mouth daily.      methocarbamol (ROBAXIN) 500 MG tablet Take 1 tablet (500 mg total) by mouth every 8 (eight) hours as needed for muscle spasms. Caution of sedation 40 tablet 1   metoprolol tartrate (LOPRESSOR) 50 MG tablet Take 1 tablet (50 mg total) by mouth 2 (two) times daily. 60 tablet 3   Multiple Vitamins-Minerals (ZINC PO) Take 1 tablet by mouth daily.     nitroGLYCERIN (NITROSTAT) 0.4 MG SL tablet Place 1 tablet (0.4 mg total) under the tongue every 5 (five) minutes x 3 doses as needed for chest pain. 30 tablet 12   omeprazole (PRILOSEC) 40 MG capsule TAKE 1 CAPUSLE BY MOUTH 30- 60 MIN BEFORE YOUR FIRST AND LAST MEALS OF THE DAY 180 capsule 3   ondansetron (ZOFRAN) 8 MG tablet Take 1 tablet (8 mg total) by mouth every 8 (eight) hours as needed for nausea or vomiting. From narcotic pain medicine 15 tablet 0   polyethylene glycol (MIRALAX / GLYCOLAX) 17 g packet Take 17 g by mouth daily.     predniSONE (DELTASONE) 10 MG tablet Take prednisone 30 mg (3 tabs) for 3 days, then continue your maintenance dose of prednisone (Patient taking differently: Take 10 mg by mouth 2 (two) times daily with a meal.) 30 tablet 0   rosuvastatin (CRESTOR) 40 MG tablet Take 1 tablet (40 mg total) by mouth daily. 30 tablet 3   No current facility-administered medications on file prior to visit.    Allergies  Allergen Reactions   Oxycodone-Acetaminophen Other (See Comments)    Stops breathing; has tolerated Tylenol before    Family History  Problem Relation Age of Onset   Heart disease Mother    Heart disease Father    Stroke Father     Heart attack Son 62   Kidney disease Neg Hx    Prostate cancer Neg Hx    Diabetes Neg Hx     BP 130/86    Pulse 75    Ht 5' 10.25" (1.784 m)    Wt 235 lb 12.8 oz (107 kg)    SpO2 96%    BMI 33.59 kg/m    Review of Systems     Objective:   Physical Exam   Lab Results  Component Value Date   HGBA1C 8.0 (A) 11/14/2021      Assessment & Plan:  Insulin-requiring type 2 DM: uncontrolled    Patient Instructions  I have sent a prescription to your pharmacy, to add Ozempic.   Please reduce the 70/30 insulin to 45 units with breakfast.   check your blood sugar twice a day.  vary the time of day when you check, between before the 3 meals, and at bedtime.  also check if you have symptoms of your blood sugar being too high or too low.  please keep a record of the readings and bring it to your next appointment here (or you can bring the meter itself).  You can write it on any piece of paper.  please call us sooner if your blood sugar goes below 70, or if most of your readings are over 200.   Please call when you run low on the Invokana, so we can change to [place].   Please come back for a follow-up appointment in 1 month.

## 2021-11-15 ENCOUNTER — Encounter: Payer: Self-pay | Admitting: Internal Medicine

## 2021-11-15 ENCOUNTER — Ambulatory Visit: Payer: PPO | Admitting: Internal Medicine

## 2021-11-15 ENCOUNTER — Telehealth: Payer: Self-pay | Admitting: Cardiovascular Disease

## 2021-11-15 DIAGNOSIS — R918 Other nonspecific abnormal finding of lung field: Secondary | ICD-10-CM

## 2021-11-15 NOTE — Telephone Encounter (Signed)
Pt c/o BP issue: STAT if pt c/o blurred vision, one-sided weakness or slurred speech ? ?1. What are your last 5 BP readings?  ?120/74 pulse 85 ?114/43 pulse 70 ?108/64 pulse 86 ?128/82 pulse 75 ? ?2. Are you having any other symptoms (ex. Dizziness, headache, blurred vision, passed out)? Has had instances with chest pain - yesterday took a nitroglycerin to help ? ?3. What is your BP issue? Patient calling with blood pressure update for Dr [name], saw him on 03/06.  Please call to discuss. Patient says he will be at pulmonary today if we need to speak with him then. ?

## 2021-11-15 NOTE — Telephone Encounter (Signed)
Able to return call to pt's wife (DPR approved), pt currently in rehab and has an appt at 48 with pulmonology.  ? ?Advised pt's BP/HR looks good, no meds adjustments at this time. ? ?Asked about pt's CP, wife reportsnitro vaspd two nights in a row when pt gets ready for bed he experience CP with SOB, "hard to cath his breath", pt takes one nitro and feels better. Educated wife on [place] and how it is a vasodilator. Could be something worth needing a cardiac work-up if persisting, could seek ER when next episode happen.  ? ?Pt seen earlier this week with Dr. [name], Patient c/o LE edema, chest pain and shortness of breath with little to no exertion, started pt on IMDUR 30 mg. Advised to wife pt can speak with pulmonology at his appt later today, if they fill he needs a sooner f/u aptp with Dr. [name], he can make an appt, (scheduling updated as FYI), otherwise if it happens again and Nitor relieves his symtpoms, worth getting a cardiac work-up to check his cardiac markers and other testing If needed, also can make an appt to discuss with Dr. [name] possibility of CCTA or stress test.  ? ?Recent hospitalization January 2023, ?Non-STEMI ?Cardiac catheterization performed ?multivessel native coronary artery diseasesaphenous vein to right coronary artery with 70% ostial to proximal stenosis, SVG to OM 1 and 2 patent, SVG to first diagonal patent, LIMA to LAD patent.  ? ?Chronic moderate shortness of breath,  ?Recovering after recent hospitalization, COPD exacerbation/Boop ? ?Otherwise all questions were address and no additional concerns at this time. Mrs. Headley thankful for the return call and advice, will tell Mr. Perz of the return call and recommendations.  Agreeable to plan, will call back for anything further.   ? ?

## 2021-11-15 NOTE — Assessment & Plan Note (Addendum)
Onset of symptoms feb 2017 with "photo neg pumonary edema pattern" ?Baseline PFTs 03/18/10  FVC  3.59 (80%) with ERV 67% and dlco 75 corrects to 106 ?- Allergy profile 12/19/15 >  Eos 0.2 /  IgE  89/ Pos Cat RAST only  ?12/19/2015  ESR 93 > started on prednisone 20 mg daily  ?HSP profile 12/19/15 >  Neg  ?- 01/07/2016 ESR down to 18 but only 30% improved symptomatically > rec continue 20 mg daily until better then 10 mg daily  ?- 02/01/2016 rec taper off x 6 weeks then ov > 05/16/2016 ESR = 58> restarted pred 06/03/16  ?- Collagen Vasc dz screen 07/29/2016  Neg  ?- PFT's  11/11/2016  FEV1 1.91 (56 % ) ratio 72  p 7 % improvement from saba p nothing prior to study with DLCO  42/43 % corrects to 77 % for alv volume but ERV 35%  ?- ESR  20 on 10 a/w 15 as  Of 11/11/2016 > rec try 10 mg daily  ?- Prednisone 5 mg daily as of 12/15/16 > taper to 5 mg qod by 02/10/2017 ?- 02/10/2017 ESR =  28  rec taper off x 2 weeks > no flare ?- 04/20/2017 ESR = 32 off pred since 02/2017  ?- 08/12/18  ESR = 16 with onset of cough  ?-  Cough recurred similar to Boop late November 2021  >  08/30/2020   Prednisone 10 mg x 2 daily until better then one daily until seen > cxr 09/26/2020 on 10 mg daily = wnl so rec taper off over 10 days  and see if cough flares  ?- 01/15/2021 flared again with taper to 5 mg so new floor of 10/5 and ceiling of 20 mg ?- 01/15/2021 rec bone density per PCP > osteopenia only ? ?The goal with a chronic steroid dependent illness is always arriving at the lowest effective dose that controls the disease/symptoms and not accepting a set "formula" which is based on statistics or guidelines that don't always take into account patient  variability or the natural hx of the dz in every individual patient, which may well vary over time.  For now therefore I recommend the patient maintain  20 mg ceiling and 10 mg floor with 5 mg increments/qod taper by 5 mg once a week and if not effective then q 2 weeks - see avs for instructions unique to this ov ?   ? ?Also advised to check sats with ex to be sure hypoxemia is not triggering angina and to call if < 90% so we can qualify him for portable 02 if needed and informed need to avoid saba in this setting as may  Offset benefit of lopressor for angina.  ? ? ?    ?  ? ?Each maintenance medication was reviewed in detail including emphasizing most importantly the difference between maintenance and prns and under what circumstances the prns are to be triggered using an action plan format where appropriate. ? ?Total time for H and P, chart review, counseling,  and generating customized AVS unique to this office visit / same day charting = 25 min  ?     ?

## 2021-11-15 NOTE — Patient Instructions (Addendum)
Prednisone 20 mg each am it a floor of 10 mg starting with 20 /15 alternate days for a full week - go back to the previous dose that worked and do the taper 2 weeks at a time ? ?Check 02 sats at peak exertion, not after you stop with goal of keeping over 90%  ? ? ?Please schedule a follow up visit in 3 months but call sooner if needed  ? ? ?

## 2021-11-15 NOTE — Progress Notes (Signed)
Subjective:   Patient ID: Kurt Aguilar, male    DOB: [birth year]  MRN: [id number]    Brief patient profile:  60 yowm never cigarette smoker (just some cigars)  after cabg in [place] started noting recurrent winter cough regardless of where located in [place] ([place]/ [place]) with typical acute episode in [place] in Feb 2017  This  cough  more dry less severe than previous >  abx and pred > improved but did not resolve and worse when stopped it so restarted pred/abx > dx as pneumonia with variable as dz > referred to pulmonary clinic 12/19/2015 by [name] with 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   History of Present Illness  12/19/2015 [facility] office visit/ Kurt Aguilar   Chief Complaint  Patient presents with   Pulmonary Consult    Referred by [name]. Pt c/o cough x 4 wks- non prod and worse in the evening and when he lies down. Talking and exertion are things that trigger the cough. He has been on pred x 2 and both times cough resolved and immediately returns once done with med. He also c/o SOB- gets winded just walking from room to room at home.   while on prednisone still some weak and sob but better cough and last dose at least one week and worse overall since off it assoc with sob room to room and weakness/ excess/ purulent sputum or mucus plugs / no unusual exp / no ctd.  Has new CPAP machine fall 2016  rec  Omeprazole 20 mg Take 30- 60 min before your first and last meals of the day  Prednisone 10  X 2 each day until 100% better then 10 mg daily  Dx is Brochilitis obliterans with organizing pneumonia vs eosiniphic pneumonia most likely diagnosis GERD  Diet    09/11/2016  f/u ov/Kurt Aguilar re: BOOP/uacs  on pred 10/5  prilosec 20 mg ac and hs  Chief Complaint  Patient presents with   Follow-up    Increased SOB for the past 3-4 days. He gets SOB with exertion such as getting dressed or walking accross the room.  He has also started coughing more "feels like I burned my  lungs"- prod with white sputum.     was doing ok on 10/5 in terms of breathing but worse gradually x one week p several weeks on the 10/5 dose  Cough some better but still needing freq tessalon on gabapentin 100 tid  rec Prednisone 10 mg  2 daily until better then 1 daily x 2 weeks then 10 alternating with 5 until return Prilosec should be 20 mg Take 30- 60 min before your first and last meals of the day  Increase gabapentin to 300 mg three times daily         Symptoms onset Dec 4th 2022  [name] 08/26/2021  acute visit  Prometh codeine    cough syrup Benzonatate perles- for cough Molnupiravir rx  and zpak  >>>  walking room to room at home/ cough never improved and pred rx up to 40 mg   Admit date:     09/17/2021   Discharge date: 09/24/21  Discharge Physician: Kurt Aguilar     Discharge Diagnoses     Acute coronary syndrome / NSTEMI(HCC) Acute kidney injury on CKD stage IIIb Lactic acidosis Shortness of breath with coughing   Essential hypertension   Right bundle branch block   CAD (coronary artery disease)   DM2 (diabetes mellitus, type 2) (  Kurt Aguilar)   Steroid dependent (HCC)   AKI (acute kidney injury) ([place])   Stage 3b chronic kidney disease (CKD) [facility])         Hospital Course     Patient is a 76 year old male with history of CAD status post CABG in 1997, DM2, HTN, HLD, Boop, recent COVID-19 infection in December 2022, chronic steroid dependency presented to ED with mild intermittent chest pain for a month, worse with exertion, dyspnea.  Patient was found to have elevated troponins and was admitted for further work-up    NSTEMI [facility]) with known history of CAD status post CABG x5 in 1997 -2D echo showed no wall motion abnormality, normal EF, likely demand ischemia -Left heart cardiac cath showed multivessel native coronary artery diseasesaphenous vein to right coronary artery with 70% ostial to proximal stenosis, SVG to OM 1 and 2 patent, SVG to first diagonal patent,  LIMA to LAD patent.  -Continue aspirin, beta-blocker, Crestor, Zetia,       Essential hypertension -BP stable, continue beta-blocker    Hyperkalemia -Resolved   Acute on chronic CKD stage IIIb, lactic acidosis -Creatinine 2.34 on admission, baseline around 1.8-1.9 -Creatinine improved, close to baseline.  Completed IV fluid hydration.      Shortness of breath with cough -Per patient 4 weeks, dry persistent cough, possibly due to COVID-19 in December (positive on 12/4), underlying history of BOOP -CXR with no pneumonia or effusion.  VQ scan negative for PE.  Normal BNP -Continues to feel winded with exertion, coughing spasms. -Continue prednisone 40 mg daily with a taper upon discharge. (Outpatient on 10 mg BID) -Continue scheduled nebs, incentive spirometry, Tessalon Perles  -Home O2 evaluation done on 1/15, did not meet criteria for home O2   Hyperlipidemia -LDL 74 in 04/2021.  Continue Crestor 40 mg daily, Zetia 10 mg daily   Diabetes mellitus type 2, on long-term insulin with renal complications -Hemoglobin A1c 9.5 on 09/13/2021 --Continue insulin regimen, hopefully CBGs will continue to improve once prednisone taper is completed and back to maintenance dose   BOOP - follows Dr Kurt Aguilar outpatient, continue prednisone     Obesity Estimated body mass index is 32.39 kg/m as calculated from the following:   Height as of this encounter: '5\' 10"'$  (1.778 m).   Weight as of this encounter: 102.4 kg.  10/04/2021  f/u ov/Kurt Aguilar/ [facility] re: sob/ cough/ weak    maint on 20 mg prednisone   Chief Complaint  Patient presents with   Follow-up   Dyspnea:  SNF x 50 ft with walker/belt and one PT lowest sats 91%  but now anemic Cough: improving  Sleeping: 30 degrees and cpap  SABA use: none  02: none  Covid status:   vax x  5 Cp similar to angina p bm's x  7 min  Rec Encourage you to go ahead and use the cough medication as needed  Leave prednisone at 20 mg until better but goal  is to get it back 5 mg baseline      11/15/2021  f/u ov/Kurt Aguilar/ [facility] re: BOOP  maint on Prednisone 20 mg   Chief Complaint  Patient presents with   Follow-up  Dyspnea:  walked with PT  for 6 min moderate with cp toward the end but did not check sats/ cp always better p ntg immediately / cards aware and now on long acting nitrates  Cough: better  Sleeping: 30 degrees cpap no 02  SABA use: none 02: none  Covid status:  vax x 5     No obvious day to day or daytime variability or assoc excess/ purulent sputum or mucus plugs or hemoptysis or cp or chest tightness, subjective wheeze or overt sinus or hb symptoms.   Sleeping as above  without nocturnal  or early am exacerbation  of respiratory  c/o's or need for noct saba. Also denies any obvious fluctuation of symptoms with weather or environmental changes or other aggravating or alleviating factors except as outlined above   No unusual exposure hx or h/o childhood pna/ asthma or knowledge of premature birth.  Current Allergies, Complete Past Medical History, Past Surgical History, Family History, and Social History were reviewed in [employer] record.  ROS  The following are not active complaints unless bolded Hoarseness, sore throat, dysphagia, dental problems, itching, sneezing,  nasal congestion or discharge of excess mucus or purulent secretions, ear ache,   fever, chills, sweats, unintended wt loss or wt gain, classically pleuritic or exertional cp,  orthopnea pnd or arm/hand swelling  or leg swelling, presyncope, palpitations, abdominal pain, anorexia, nausea, vomiting, diarrhea  or change in bowel habits or change in bladder habits, change in stools or change in urine, dysuria, hematuria,  rash, arthralgias, visual complaints, headache, numbness, weakness or ataxia or problems with walking or coordination,  change in mood or  memory.        Current Meds  Medication Sig   acetaminophen (TYLENOL) 650  MG CR tablet Take 650 mg by mouth every 8 (eight) hours as needed for pain.   albuterol (VENTOLIN HFA) 108 (90 Base) MCG/ACT inhaler Inhale 2 puffs into the lungs every 6 (six) hours as needed for wheezing or shortness of breath.   allopurinol (ZYLOPRIM) 100 MG tablet Take 1 tablet (100 mg total) by mouth 2 (two) times daily. (Patient taking differently: Take 100 mg by mouth 2 (two) times daily. Patient states taking '80mg'$  daily)   aspirin 81 MG tablet Take 1 tablet (81 mg total) by mouth 2 (two) times daily after a meal.   benzonatate (TESSALON) 200 MG capsule Take 1 capsule (200 mg total) by mouth 3 (three) times daily as needed for cough.   Cholecalciferol (VITAMIN D3 PO) Take 1 capsule by mouth daily.   colchicine 0.6 MG tablet TAKE 1 TABLET BY MOUTH TWICE A DAY WITH A MEAL AS NEEDED FOR ACUTE FLARE OF GOUT   Continuous Blood Gluc Receiver (FREESTYLE LIBRE 2 READER) DEVI USE AS DIRECTED   Continuous Blood Gluc Sensor (FREESTYLE LIBRE 2 SENSOR) MISC 1 Device by Does not apply route every 14 (fourteen) days.   empagliflozin (JARDIANCE) 10 MG TABS tablet Take by mouth daily.   ezetimibe (ZETIA) 10 MG tablet Take 1 tablet (10 mg total) by mouth daily.   famotidine (PEPCID) 20 MG tablet TAKE 1 TABLET BY MOUTH EVERYDAY AT BEDTIME   furosemide (LASIX) 20 MG tablet Take 0.5 tablets (10 mg total) by mouth daily.   Glucosamine HCl (GLUCOSAMINE PO) Take 1,500 mg by mouth 2 (two) times daily.    glucose blood (FREESTYLE LITE) test strip 1 each by Other route 4 (four) times daily. E11.65   HYDROcodone bit-homatropine (HYCODAN) 5-1.5 MG/5ML syrup Take 5 mLs by mouth every 6 (six) hours as needed for cough.   insulin isophane & regular human KwikPen (NOVOLIN 70/30 KWIKPEN) (70-30) 100 UNIT/ML KwikPen Inject 45 Units into the skin daily with breakfast. And pen needles 1/day   INVOKANA 100 MG TABS tablet TAKE 1 TABLET BY MOUTH EVERY  OTHER DAY (Patient taking differently: Take 50 mg by mouth daily before  breakfast.)   isosorbide mononitrate (IMDUR) 30 MG 24 hr tablet Take 1 tablet (30 mg total) by mouth daily.   Lancets (FREESTYLE) lancets 1 EACH BY OTHER ROUTE 4 (FOUR) TIMES DAILY. E11.65   Lutein 20 MG CAPS Take 20 mg by mouth daily.    methocarbamol (ROBAXIN) 500 MG tablet Take 1 tablet (500 mg total) by mouth every 8 (eight) hours as needed for muscle spasms. Caution of sedation   metoprolol tartrate (LOPRESSOR) 50 MG tablet Take 1 tablet (50 mg total) by mouth 2 (two) times daily.   Multiple Vitamins-Minerals (ZINC PO) Take 1 tablet by mouth daily.   nitroGLYCERIN (NITROSTAT) 0.4 MG SL tablet Place 1 tablet (0.4 mg total) under the tongue every 5 (five) minutes x 3 doses as needed for chest pain.   omeprazole (PRILOSEC) 40 MG capsule TAKE 1 CAPUSLE BY MOUTH 30- 60 MIN BEFORE YOUR FIRST AND LAST MEALS OF THE DAY   ondansetron (ZOFRAN) 8 MG tablet Take 1 tablet (8 mg total) by mouth every 8 (eight) hours as needed for nausea or vomiting. From narcotic pain medicine   polyethylene glycol (MIRALAX / GLYCOLAX) 17 g packet Take 17 g by mouth daily.   predniSONE (DELTASONE) 10 MG tablet Take prednisone 30 mg (3 tabs) for 3 days, then continue your maintenance dose of prednisone (Patient taking differently: Take 10 mg by mouth 2 (two) times daily with a meal.)   rosuvastatin (CRESTOR) 40 MG tablet Take 1 tablet (40 mg total) by mouth daily.   Semaglutide,0.25 or 0.'5MG'$ /DOS, (OZEMPIC, 0.25 OR 0.5 MG/DOSE,) 2 MG/1.5ML SOPN Inject 0.5 mg into the skin once a week.                                   Objective:   Physical Exam   11/15/2021        236  10/04/2021        230  05/22/2021        230   01/15/2021       231  09/26/2020        220  11/24/2019        206  09/20/2018        222  08/26/2018      222 07/29/2017      232  04/15/2017          243  02/10/2017          250  12/19/2016        250 11/11/2016          258  09/11/2016          252  07/29/2016      244  05/16/2016          238   02/01/2016        235  01/07/2016          228   12/19/15 229 lb 4 oz (103.987 kg)  12/19/15 229 lb 4 oz (103.987 kg)  12/19/15 227 lb (102.967 kg)    Vital signs reviewed  11/15/2021  - Note at rest 02 sats  95% on RA   General appearance:    min cushingnoid w/c bound wm nad     HEENT : pt wearing mask not removed for exam due to covid -19 concerns.    NECK :  without JVD/Nodes/TM/  nl carotid upstrokes bilaterally   LUNGS: no acc muscle use,  Nl contour chest with faint insp crackles bases  bilaterally without cough on insp or exp maneuvers   CV:  RRR  no s3 or murmur or increase in P2, and trace pitting edema both ankles   ABD:  soft and nontender with nl inspiratory excursion in the supine position. No bruits or organomegaly appreciated, bowel sounds nl  MS:  Nl gait/ ext warm without deformities, calf tenderness, cyanosis or clubbing No obvious joint restrictions   SKIN: warm and dry without lesions    NEURO:  alert, approp, nl sensorium with  no motor or cerebellar deficits apparent.           Assessment & Plan:

## 2021-11-17 ENCOUNTER — Emergency Department (HOSPITAL_COMMUNITY): Payer: PPO

## 2021-11-17 ENCOUNTER — Emergency Department (HOSPITAL_COMMUNITY)
Admission: EM | Admit: 2021-11-17 | Discharge: 2021-11-18 | Disposition: A | Discharge status: Home / Self Care | Payer: PPO | Attending: Emergency Medicine | Admitting: Emergency Medicine

## 2021-11-17 DIAGNOSIS — R0602 Shortness of breath: Secondary | ICD-10-CM | POA: Diagnosis not present

## 2021-11-17 DIAGNOSIS — R509 Fever, unspecified: Secondary | ICD-10-CM | POA: Insufficient documentation

## 2021-11-17 DIAGNOSIS — R079 Chest pain, unspecified: Secondary | ICD-10-CM | POA: Diagnosis not present

## 2021-11-17 DIAGNOSIS — E119 Type 2 diabetes mellitus without complications: Secondary | ICD-10-CM | POA: Insufficient documentation

## 2021-11-17 DIAGNOSIS — N189 Chronic kidney disease, unspecified: Secondary | ICD-10-CM | POA: Diagnosis not present

## 2021-11-17 DIAGNOSIS — Z8616 Personal history of COVID-19: Secondary | ICD-10-CM | POA: Insufficient documentation

## 2021-11-17 DIAGNOSIS — Z79899 Other long term (current) drug therapy: Secondary | ICD-10-CM | POA: Insufficient documentation

## 2021-11-17 DIAGNOSIS — Z794 Long term (current) use of insulin: Secondary | ICD-10-CM | POA: Diagnosis not present

## 2021-11-17 DIAGNOSIS — Z7982 Long term (current) use of aspirin: Secondary | ICD-10-CM | POA: Diagnosis not present

## 2021-11-17 DIAGNOSIS — R110 Nausea: Secondary | ICD-10-CM | POA: Diagnosis not present

## 2021-11-17 DIAGNOSIS — Z7984 Long term (current) use of oral hypoglycemic drugs: Secondary | ICD-10-CM | POA: Diagnosis not present

## 2021-11-17 DIAGNOSIS — R519 Headache, unspecified: Secondary | ICD-10-CM | POA: Insufficient documentation

## 2021-11-17 DIAGNOSIS — R0600 Dyspnea, unspecified: Secondary | ICD-10-CM | POA: Insufficient documentation

## 2021-11-17 DIAGNOSIS — I129 Hypertensive chronic kidney disease with stage 1 through stage 4 chronic kidney disease, or unspecified chronic kidney disease: Secondary | ICD-10-CM | POA: Insufficient documentation

## 2021-11-17 DIAGNOSIS — M7989 Other specified soft tissue disorders: Secondary | ICD-10-CM | POA: Insufficient documentation

## 2021-11-17 LAB — BASIC METABOLIC PANEL
Anion gap: 13 (ref 5–15)
BUN: 36 mg/dL — ABNORMAL HIGH (ref 8–23)
CO2: 22 mmol/L (ref 22–32)
Calcium: 9.9 mg/dL (ref 8.9–10.3)
Chloride: 105 mmol/L (ref 98–111)
Creatinine, Ser: 1.77 mg/dL — ABNORMAL HIGH (ref 0.61–1.24)
GFR, Estimated: 39 mL/min — ABNORMAL LOW (ref >60)
Glucose, Bld: 111 mg/dL — ABNORMAL HIGH (ref 70–99)
Potassium: 4 mmol/L (ref 3.5–5.1)
Sodium: 140 mmol/L (ref 135–145)

## 2021-11-17 LAB — HEPATIC FUNCTION PANEL
ALT: 51 U/L — ABNORMAL HIGH (ref 0–44)
AST: 44 U/L — ABNORMAL HIGH (ref 15–41)
Albumin: 3.8 g/dL (ref 3.5–5.0)
Alkaline Phosphatase: 38 U/L (ref 38–126)
Bilirubin, Direct: 0.2 mg/dL (ref 0.0–0.2)
Indirect Bilirubin: 0.9 mg/dL (ref 0.3–0.9)
Total Bilirubin: 1.1 mg/dL (ref 0.3–1.2)
Total Protein: 6.5 g/dL (ref 6.5–8.1)

## 2021-11-17 LAB — CBC
HCT: 37.7 % — ABNORMAL LOW (ref 39.0–52.0)
Hemoglobin: 11.9 g/dL — ABNORMAL LOW (ref 13.0–17.0)
MCH: 33.2 pg (ref 26.0–34.0)
MCHC: 31.6 g/dL (ref 30.0–36.0)
MCV: 105.3 fL — ABNORMAL HIGH (ref 80.0–100.0)
Platelets: 195 10*3/uL (ref 150–400)
RBC: 3.58 MIL/uL — ABNORMAL LOW (ref 4.22–5.81)
RDW: 16 % — ABNORMAL HIGH (ref 11.5–15.5)
WBC: 9.5 10*3/uL (ref 4.0–10.5)
nRBC: 1.2 % — ABNORMAL HIGH (ref 0.0–0.2)

## 2021-11-17 LAB — TROPONIN I (HIGH SENSITIVITY): Troponin I (High Sensitivity): 35 ng/L — ABNORMAL HIGH (ref <18)

## 2021-11-17 MED ORDER — ACETAMINOPHEN 325 MG PO TABS
650.0000 mg | ORAL_TABLET | Freq: Once | ORAL | Status: AC
Start: 2021-11-17 — End: 2021-11-17
  Administered 2021-11-17: 650 mg via ORAL
  Filled 2021-11-17: qty 2

## 2021-11-17 NOTE — ED Provider Notes (Signed)
Care assumed from Dr. Jeanell Sparrow, patient with slightly elevated troponin pending delta troponin. ? ?Repeat troponin is unchanged, patient is discharged with instructions to follow-up with PCP. ?  ?Delora Fuel, MD ?71/83/67 [id number] ? ?

## 2021-11-17 NOTE — ED Provider Notes (Signed)
[facility] EMER[facility]ENC[facility] DEPARTMENT Provider Note   CSN: [id number] Arrival date & time: 11/17/21  1959     History  Chief Complaint  Patient presents with   Shortness of Breath   Headache   Nausea    Kurt Aguilar is a 76 y.o. male.  HPI 76 year old male history of acute coronary syndrome, Boop, hypertension, type 2 diabetes, steroid dependence, CKD presents today complaining of dyspnea, headache, nausea.  He states that the dyspnea is at baseline worsening with exertion.  He has a headache that began today and is moderate.  He is not currently on any blood thinners.  He has not noted any localized weakness.  He had some subjective fever earlier but did not take his temperature.  He has been on new medications for his blood sugar.  He took the first injection of his MPA yesterday.  He is noted that his blood sugars have been lower today in the 90s.  He has been eating and drinking without difficulty.  The headache is diffuse in his head.  He is not having vision changes.  He denies chest pain.  He was seen by Dr. Debby Bud on Thursday.  He has been on his baseline prednisone of 15 mg alternating with 20 mg daily he feels he has some increased swelling of his legs. Reviewed Dr. Marlene Lard note from Thursday and notes from Thursday.  Reviewed last hospital discharge  Recent COVID infection in December    Home Medications Prior to Admission medications   Medication Sig Start Date End Date Taking? Authorizing Provider  acetaminophen (TYLENOL) 650 MG CR tablet Take 650 mg by mouth every 8 (eight) hours as needed for pain.    [provider]  albuterol (VENTOLIN HFA) 108 (90 Base) MCG/ACT inhaler Inhale 2 puffs into the lungs every 6 (six) hours as needed for wheezing or shortness of breath. 09/24/21   Rai, Vernelle Emerald, MD  allopurinol (ZYLOPRIM) 100 MG tablet Take 1 tablet (100 mg total) by mouth 2 (two) times daily. Patient taking differently: Take 100 mg by mouth 2 (two)  times daily. Patient states taking '80mg'$  daily 04/12/21   Tower, [name], MD  aspirin 81 MG tablet Take 1 tablet (81 mg total) by mouth 2 (two) times daily after a meal. 05/29/20   Loni Dolly, PA-C  benzonatate (TESSALON) 200 MG capsule Take 1 capsule (200 mg total) by mouth 3 (three) times daily as needed for cough. 09/24/21   Rai, Vernelle Emerald, MD  Cholecalciferol (VITAMIN D3 PO) Take 1 capsule by mouth daily.    [provider]  colchicine 0.6 MG tablet TAKE 1 TABLET BY MOUTH TWICE A DAY WITH A MEAL AS NEEDED FOR ACUTE FLARE OF GOUT 03/16/18   Tower, [name], MD  Continuous Blood Gluc Receiver (FREESTYLE LIBRE 2 READER) DEVI USE AS DIRECTED 10/18/21   Renato Shin, MD  Continuous Blood Gluc Sensor (FREESTYLE LIBRE 2 SENSOR) MISC 1 Device by Does not apply route every 14 (fourteen) days. 10/16/21   Renato Shin, MD  empagliflozin (JARDIANCE) 10 MG TABS tablet Take by mouth daily.    [provider]  ezetimibe (ZETIA) 10 MG tablet Take 1 tablet (10 mg total) by mouth daily. 04/12/21   Tower, [name], MD  famotidine (PEPCID) 20 MG tablet TAKE 1 TABLET BY MOUTH EVERYDAY AT BEDTIME 08/21/21   Tanda Rockers, MD  furosemide (LASIX) 20 MG tablet Take 0.5 tablets (10 mg total) by mouth daily. 10/21/21 01/19/22  Furth, Cadence H, PA-C  Glucosamine HCl (GLUCOSAMINE PO) Take 1,500 mg by mouth 2 (two) times daily.     [provider]  glucose blood (FREESTYLE LITE) test strip 1 each by Other route 4 (four) times daily. E11.65 04/10/20   Renato Shin, MD  HYDROcodone bit-homatropine (HYCODAN) 5-1.5 MG/5ML syrup Take 5 mLs by mouth every 6 (six) hours as needed for cough. 08/27/21   Deneise Lever, MD  insulin isophane & regular human KwikPen (NOVOLIN 70/30 KWIKPEN) (70-30) 100 UNIT/ML KwikPen Inject 45 Units into the skin daily with breakfast. And pen needles 1/day 11/14/21   Renato Shin, MD  INVOKANA 100 MG TABS tablet TAKE 1 TABLET BY MOUTH EVERY OTHER DAY Patient taking differently: Take 50  mg by mouth daily before breakfast. 09/10/21   Renato Shin, MD  isosorbide mononitrate (IMDUR) 30 MG 24 hr tablet Take 1 tablet (30 mg total) by mouth daily. 11/11/21   Minna Merritts, MD  Lancets (FREESTYLE) lancets 1 EACH BY OTHER ROUTE 4 (FOUR) TIMES DAILY. E11.65 08/23/20   Renato Shin, MD  Lutein 20 MG CAPS Take 20 mg by mouth daily.     [provider]  methocarbamol (ROBAXIN) 500 MG tablet Take 1 tablet (500 mg total) by mouth every 8 (eight) hours as needed for muscle spasms. Caution of sedation 05/29/20   Loni Dolly, PA-C  metoprolol tartrate (LOPRESSOR) 50 MG tablet Take 1 tablet (50 mg total) by mouth 2 (two) times daily. 09/24/21   Rai, Vernelle Emerald, MD  Multiple Vitamins-Minerals (ZINC PO) Take 1 tablet by mouth daily.    [provider]  nitroGLYCERIN (NITROSTAT) 0.4 MG SL tablet Place 1 tablet (0.4 mg total) under the tongue every 5 (five) minutes x 3 doses as needed for chest pain. 09/24/21   Rai, Ripudeep K, MD  omeprazole (PRILOSEC) 40 MG capsule TAKE 1 CAPUSLE BY MOUTH 30- 60 MIN BEFORE YOUR FIRST AND LAST MEALS OF THE DAY 09/13/21   Tanda Rockers, MD  ondansetron (ZOFRAN) 8 MG tablet Take 1 tablet (8 mg total) by mouth every 8 (eight) hours as needed for nausea or vomiting. From narcotic pain medicine 01/03/20   Tower, [name], MD  polyethylene glycol (MIRALAX / GLYCOLAX) 17 g packet Take 17 g by mouth daily.    [provider]  predniSONE (DELTASONE) 10 MG tablet Take prednisone 30 mg (3 tabs) for 3 days, then continue your maintenance dose of prednisone Patient taking differently: Take 10 mg by mouth 2 (two) times daily with a meal. 09/24/21   Rai, Ripudeep K, MD  rosuvastatin (CRESTOR) 40 MG tablet Take 1 tablet (40 mg total) by mouth daily. 09/25/21   Rai, Ripudeep K, MD  Semaglutide,0.25 or 0.'5MG'$ /DOS, (OZEMPIC, 0.25 OR 0.5 MG/DOSE,) 2 MG/1.5ML SOPN Inject 0.5 mg into the skin once a week. 11/14/21   Renato Shin, MD      Allergies     Oxycodone-acetaminophen    Review of Systems   Review of Systems  All other systems reviewed and are negative.  Physical Exam Updated Vital Signs BP (!) 176/93    Pulse 74    Temp 98.2 F (36.8 C) (Oral)    Resp 20    SpO2 97%  Physical Exam Vitals and nursing note reviewed.  Constitutional:      General: He is not in acute distress.    Appearance: He is well-developed. He is obese.  HENT:     Head: Normocephalic.     Mouth/Throat:  Mouth: Mucous membranes are moist.  Eyes:     Pupils: Pupils are equal, round, and reactive to light.  Cardiovascular:     Rate and Rhythm: Normal rate and regular rhythm.  Pulmonary:     Effort: Pulmonary effort is normal.  Musculoskeletal:        General: Normal range of motion.     Cervical back: Normal range of motion.     Comments: Trace of edema bilateral lower extremities  Skin:    General: Skin is warm and dry.     Capillary Refill: Capillary refill takes less than 2 seconds.  Neurological:     General: No focal deficit present.     Mental Status: He is alert.  Psychiatric:        Mood and Affect: Mood normal.    ED Results / Procedures / Treatments   Labs (all labs ordered are listed, but only abnormal results are displayed) Labs Reviewed  BASIC METABOLIC PANEL - Abnormal; Notable for the following components:      Result Value   Glucose, Bld 111 (*)    BUN 36 (*)    Creatinine, Ser 1.77 (*)    GFR, Estimated 39 (*)    All other components within normal limits  CBC - Abnormal; Notable for the following components:   RBC 3.58 (*)    Hemoglobin 11.9 (*)    HCT 37.7 (*)    MCV 105.3 (*)    RDW 16.0 (*)    nRBC 1.2 (*)    All other components within normal limits  HEPATIC FUNCTION PANEL - Abnormal; Notable for the following components:   AST 44 (*)    ALT 51 (*)    All other components within normal limits  TROPONIN I (HIGH SENSITIVITY) - Abnormal; Notable for the following components:   Troponin I (High  Sensitivity) 35 (*)    All other components within normal limits  TROPONIN I (HIGH SENSITIVITY)    EKG None  Radiology DG Chest 2 View  Result Date: 11/17/2021 CLINICAL DATA:  Chest pain and shortness of breath. EXAM: CHEST - 2 VIEW COMPARISON:  Portable chest 09/20/2021. FINDINGS: Intact median sternotomy sutures are again noted. The cardiac size is normal. The aorta is tortuous with patchy calcification. Stable mediastinal configuration is noted. No pleural effusion is seen. The lungs clear of infiltrates. Left apicolateral calcified pleural plaquing is again shown. Moderate thoracic spondylosis. IMPRESSION: No evidence of acute chest disease or interval changes. Aortic atherosclerosis. Left chest calcified pleural plaquing. Electronically Signed   By: Telford Nab M.D.   On: 11/17/2021 21:23   CT Head Wo Contrast  Result Date: 11/17/2021 CLINICAL DATA:  Headache, new or worsening. EXAM: CT HEAD WITHOUT CONTRAST TECHNIQUE: Contiguous axial images were obtained from the base of the skull through the vertex without intravenous contrast. RADIATION DOSE REDUCTION: This exam was performed according to the departmental dose-optimization program which includes automated exposure control, adjustment of the mA and/or kV according to patient size and/or use of iterative reconstruction technique. COMPARISON:  Head CT 05/03/2019 FINDINGS: Brain: There is mild-to-moderate cerebral atrophy, with mild atrophic ventriculomegaly and small-vessel disease. Small chronic lacunar infarct is again noted in the anterior limb of left internal capsule. There is mild cerebellar atrophy with otherwise unremarkable cerebellum, brainstem. No focal asymmetry is seen concerning for an acute infarct, hemorrhage or mass. There is no midline shift. The basal cisterns are patent. Vascular: There are calcifications in the distal vertebral arteries and carotid siphons but  no hyperdense central vessels. Skull: The calvarium, skull  base and orbits are intact. No focal lesion is seen. Sinuses/Orbits: There is membrane thickening and retention cysts in the floor both maxillary sinuses. There is interval partial opacification of some of the right ethmoid air cells. The other sinuses, and bilateral mastoid air cells are clear. Other: None. IMPRESSION: 1. No acute intracranial CT findings or intracranial interval changes. 2. Chronic change. 3. Carotid atherosclerosis. 4. Sinus disease, including with interval partial opacification of the right ethmoid air cells. Electronically Signed   By: Telford Nab M.D.   On: 11/17/2021 21:29    Procedures Procedures    Medications Ordered in ED Medications  acetaminophen (TYLENOL) tablet 650 mg (650 mg Oral Given 11/17/21 2133)    ED Course/ Medical Decision Making/ A&P Clinical Course as of 11/17/21 2327  Sun Nov 17, 1608  9604  metabolic panel reviewed and interpreted with creatinine 1.77 which is improved from from prior [DR]  2318 CBC(!) CBC reviewed and interpreted with normal white blood cell count, mild anemia with hemoglobin of 11.9 which is stable for this patient and platelets 195,000 [DR]  2324 Troponin I (High Sensitivity)(!) Initial troponin reviewed and elevated at 35. I have reviewed the prior troponins elevation with his NSTEMI in January and they peaked at 250.  This will be repeated [DR]    Clinical Course User Index [DR] Pattricia Boss, MD                           Medical Decision Making 76 yo male presents today with headache and nausea, dyspnea at baseline.  Evaluated here with EKG, labs, head CT, chest x-Ladamien Rammel. No acute intracranial abnormality noted although there is some opacification of his right ethmoid air cells.  Headache has improved with acetaminophen EKG with right bundle branch block but no acute ST changes.  Initial troponin elevated at 35.  Patient admitted in January with troponin increased at that time to 250. Otherwise appears stable with stable  ckd, anemia. LFT slightly elevated and discussed need for op follow up 1- headache-CT reviewed no evidence of acute intracranial hemorrhage or other abnormality Patient's headache has improved with acetaminophen.  Possible new ethmoid sinusitis seen.  Patient is not febrile or having other symptoms and plan outpatient follow-up for this. 2 patient with chronic lung disease which appears stable here today with clear chest x-Finnis Colee and normal oxygen saturations 3 patient had EKG obtained with right bundle branch block and tachycardia.  No acute changes are noted on this.  It is unchanged from prior.  ST segments are not elevated.  Troponin is slightly elevated at 35.  He is not having active chest pain.  This is being repeated 4 patient with mildly elevated LFTs from baseline.  We have discussed these and need for follow-up 5 stable anemia   6 stable CKD with elevated creatinine  Amount and/or Complexity of Data Reviewed External Data Reviewed: notes.    Details: Discharge summary from January with NSTEMI diagnosis reviewed Labs: ordered. Decision-making details documented in ED Course. Radiology: ordered and independent interpretation performed. Decision-making details documented in ED Course. ECG/medicine tests: ordered and independent interpretation performed. Decision-making details documented in ED Course. Discussion of management or test interpretation with external provider(s): Care discussed with Dr. Roxanne Mins and he will disposition after repeat troponin obtained  Risk OTC drugs.          Final Clinical Impression(s) / ED Diagnoses Final diagnoses:  Acute nonintractable headache, unspecified headache type    Rx / DC Orders ED Discharge Orders     None         Pattricia Boss, MD 11/17/21 2327

## 2021-11-17 NOTE — ED Triage Notes (Signed)
Pt c/o SHOB, nausea, HA, onset 1wk ago. Pt c/o "various issues" since Covid dx in December. Hx MI, HTN, BOOP.  ?

## 2021-11-17 NOTE — Discharge Instructions (Addendum)
Please use acetaminophen 650 mg up to 2 times a day but not more than this as your liver function tests are slightly elevated ?Speak with your doctor tomorrow to have your liver function tests reevaluated ?Return if you are having any worsening symptoms at any time. ?

## 2021-11-18 LAB — TROPONIN I (HIGH SENSITIVITY): Troponin I (High Sensitivity): 35 ng/L — ABNORMAL HIGH (ref <18)

## 2021-11-21 DIAGNOSIS — I10 Essential (primary) hypertension: Secondary | ICD-10-CM | POA: Diagnosis not present

## 2021-11-21 DIAGNOSIS — E1129 Type 2 diabetes mellitus with other diabetic kidney complication: Secondary | ICD-10-CM | POA: Diagnosis not present

## 2021-11-21 DIAGNOSIS — N1832 Chronic kidney disease, stage 3b: Secondary | ICD-10-CM | POA: Diagnosis not present

## 2021-11-21 DIAGNOSIS — Q612 Polycystic kidney, adult type: Secondary | ICD-10-CM | POA: Diagnosis not present

## 2021-11-26 DIAGNOSIS — N1832 Chronic kidney disease, stage 3b: Secondary | ICD-10-CM | POA: Diagnosis not present

## 2021-11-26 DIAGNOSIS — E1169 Type 2 diabetes mellitus with other specified complication: Secondary | ICD-10-CM | POA: Diagnosis not present

## 2021-11-26 DIAGNOSIS — I2510 Atherosclerotic heart disease of native coronary artery without angina pectoris: Secondary | ICD-10-CM | POA: Diagnosis not present

## 2021-11-26 DIAGNOSIS — Z794 Long term (current) use of insulin: Secondary | ICD-10-CM | POA: Diagnosis not present

## 2021-11-26 DIAGNOSIS — I25118 Atherosclerotic heart disease of native coronary artery with other forms of angina pectoris: Secondary | ICD-10-CM | POA: Diagnosis not present

## 2021-11-26 DIAGNOSIS — E1165 Type 2 diabetes mellitus with hyperglycemia: Secondary | ICD-10-CM | POA: Diagnosis not present

## 2021-11-26 DIAGNOSIS — E785 Hyperlipidemia, unspecified: Secondary | ICD-10-CM | POA: Diagnosis not present

## 2021-11-26 DIAGNOSIS — I25119 Atherosclerotic heart disease of native coronary artery with unspecified angina pectoris: Secondary | ICD-10-CM | POA: Diagnosis not present

## 2021-11-26 DIAGNOSIS — I10 Essential (primary) hypertension: Secondary | ICD-10-CM | POA: Diagnosis not present

## 2021-11-26 DIAGNOSIS — E1122 Type 2 diabetes mellitus with diabetic chronic kidney disease: Secondary | ICD-10-CM | POA: Diagnosis not present

## 2021-11-26 DIAGNOSIS — E669 Obesity, unspecified: Secondary | ICD-10-CM | POA: Diagnosis not present

## 2021-11-26 DIAGNOSIS — I208 Other forms of angina pectoris: Secondary | ICD-10-CM | POA: Diagnosis not present

## 2021-12-03 ENCOUNTER — Other Ambulatory Visit: Payer: Self-pay

## 2021-12-03 ENCOUNTER — Ambulatory Visit: Payer: PPO | Admitting: Endocrinology

## 2021-12-03 VITALS — BP 116/80 | HR 92 | Ht 70.0 in | Wt 237.0 lb

## 2021-12-03 DIAGNOSIS — N1832 Chronic kidney disease, stage 3b: Secondary | ICD-10-CM | POA: Diagnosis not present

## 2021-12-03 DIAGNOSIS — E1122 Type 2 diabetes mellitus with diabetic chronic kidney disease: Secondary | ICD-10-CM

## 2021-12-03 MED ORDER — SEMAGLUTIDE (1 MG/DOSE) 4 MG/3ML ~~LOC~~ SOPN
1.0000 mg | PEN_INJECTOR | SUBCUTANEOUS | 3 refills | Status: DC
Start: 2021-12-03 — End: 2022-01-09

## 2021-12-03 MED ORDER — FREESTYLE LIBRE 2 READER DEVI: 1.0000 | Freq: Once | 1 refills | Status: AC

## 2021-12-03 MED ORDER — FREESTYLE LIBRE 2 SENSOR MISC: 1.0000 | 3 refills | Status: DC

## 2021-12-03 NOTE — Patient Instructions (Addendum)
I have sent a prescription to your pharmacy, to increase the Ozempic.   ?Please continue the same 70/30 insulin.  ?check your blood sugar twice a day.  vary the time of day when you check, between before the 3 meals, and at bedtime.  also check if you have symptoms of your blood sugar being too high or too low.  please keep a record of the readings and bring it to your next appointment here (or you can bring the meter itself).  You can write it on any piece of paper.  please call us sooner if your blood sugar goes below 70, or if most of your readings are over 200.   ?Please call when you run low on the Invokana, so we can change to [place].   ?I have sent a prescription to your a mail order medicare part B provider, for the continuous glucose monitor sensors and reader. ?Please come back for a follow-up appointment in 1 month.   ?

## 2021-12-03 NOTE — Progress Notes (Signed)
? ?Subjective:  ? ? Patient ID: Kurt Aguilar, male    DOB: [birth year], 76 y.o.   MRN: [id number] ? ?HPI ?Pt returns for f/u of diabetes mellitus:  ?DM type: Insulin-requiring type 2 ?Dx'ed: 2014 ?Complications: PN, stage 3b CRI, and CAD.   ?Therapy: insulin since 2023, and Invokana.   ?DKA: never.  ?Severe hypoglycemia: never.  ?Pancreatitis: never.  ?Other: he takes intermitt prednisone for BOOP; renal insuff limits rx options; he also took insulin 2018-2019.   ?Interval history: no change in chronic prednisone Meter is downloaded today, and the printout is scanned into the record.  cbg's vary from 97-300.  It is in general higher as the day goes  ?Past Medical History:  ?Diagnosis Date  ? Arthritis   ? Chronic kidney disease   ? Complication of anesthesia   ? constipation and inability to  urinate happened a few days after cataract surgery   ? Diabetes ([place])   ? type 2   ? Diverticulitis   ? GERD (gastroesophageal reflux disease)   ? Gout   ? Heart attack ([place])   ? Heart disease   ? Heart murmur   ? Hyperglycemia   ? Hyperlipidemia   ? Hypertension   ? Osteoporosis   ? Pneumonia   ? hx of BOOP followed by Dr [name]   ? Sleep apnea   ? cpapp - setting at 17   ? ? ?Past Surgical History:  ?Procedure Laterality Date  ? ACROMIO-CLAVICULAR JOINT REPAIR Left 05/05/2019  ? Procedure: SHOULDER ARTHROSCOPIC ASSISTED ACROMIO-CLAVICULAR JOINT REPAIR;  Surgeon: Tania Ade, MD;  Location: [facility];  Service: Orthopedics;  Laterality: Left;  ? CATARACT EXTRACTION Bilateral   ? CORONARY ARTERY BYPASS GRAFT    ? [place]  ? LEFT HEART CATH AND CORS/GRAFTS ANGIOGRAPHY N/A 09/19/2021  ? Procedure: LEFT HEART CATH AND CORS/GRAFTS ANGIOGRAPHY;  Surgeon: Belva Crome, MD;  Location: [facility];  Service: Cardiovascular;  Laterality: N/A;  ? open heart surgery  09/09/1995-1998  ? 5 bypass  ? REPAIR KNEE LIGAMENT    ? right  ? SHOULDER ARTHROSCOPY Left 05/05/2019  ? Procedure: ARTHROSCOPY SHOULDER;  Surgeon:  Tania Ade, MD;  Location: [facility];  Service: Orthopedics;  Laterality: Left;  ? TONSILLECTOMY    ? TOTAL KNEE ARTHROPLASTY Left 05/29/2020  ? Procedure: LEFT TOTAL KNEE ARTHROPLASTY;  Surgeon: Melrose Nakayama, MD;  Location: [facility];  Service: Orthopedics;  Laterality: Left;  ? ? ?Social History  ? ?Socioeconomic History  ? Marital status: Married  ?  Spouse name: Not on file  ? Number of children: 4  ? Years of education: 46  ? Highest education level: Not on file  ?Occupational History  ? Occupation: Regulatory affairs officer  ?  Employer: RETIRED  ?Tobacco Use  ? Smoking status: Former  ?  Packs/day: 1.00  ?  Years: 3.00  ?  Pack years: 3.00  ?  Types: Cigars, Cigarettes  ?  Quit date: 11/25/2004  ?  Years since quitting: 17.0  ? Smokeless tobacco: Never  ? Tobacco comments:  ?  occas. cigar quit 2006  ?Vaping Use  ? Vaping Use: Never used  ?Substance and Sexual Activity  ? Alcohol use: Yes  ?  Alcohol/week: 1.0 standard drink  ?  Types: 1 Standard drinks or equivalent per week  ?  Comment: rare  ? Drug use: No  ? Sexual activity: Never  ?Other Topics Concern  ? Not on file  ?Social History Narrative  ?  Not on file  ? ?Social Determinants of Health  ? ?Financial Resource Strain: Low Risk   ? Difficulty of Paying Living Expenses: Not hard at all  ?Food Insecurity: No Food Insecurity  ? Worried About Charity fundraiser in the Last Year: Never true  ? Ran Out of Food in the Last Year: Never true  ?Transportation Needs: No Transportation Needs  ? Lack of Transportation (Medical): No  ? Lack of Transportation (Non-Medical): No  ?Physical Activity: Sufficiently Active  ? Days of Exercise per Week: 4 days  ? Minutes of Exercise per Session: 50 min  ?Stress: No Stress Concern Present  ? Feeling of Stress : Not at all  ?Social Connections: Not on file  ?Intimate Partner Violence: Not At Risk  ? Fear of Current or Ex-Partner: No  ? Emotionally Abused: No  ? Physically Abused: No  ? Sexually Abused: No  ? ? ?Current Outpatient  Medications on File Prior to Visit  ?Medication Sig Dispense Refill  ? acetaminophen (TYLENOL) 650 MG CR tablet Take 650 mg by mouth every 8 (eight) hours as needed for pain.    ? albuterol (VENTOLIN HFA) 108 (90 Base) MCG/ACT inhaler Inhale 2 puffs into the lungs every 6 (six) hours as needed for wheezing or shortness of breath. 8 g 2  ? allopurinol (ZYLOPRIM) 100 MG tablet Take 1 tablet (100 mg total) by mouth 2 (two) times daily. (Patient taking differently: Take 100 mg by mouth 2 (two) times daily. Patient states taking '80mg'$  daily) 180 tablet 3  ? aspirin 81 MG tablet Take 1 tablet (81 mg total) by mouth 2 (two) times daily after a meal. 30 tablet 0  ? benzonatate (TESSALON) 200 MG capsule Take 1 capsule (200 mg total) by mouth 3 (three) times daily as needed for cough. 30 capsule 1  ? Cholecalciferol (VITAMIN D3 PO) Take 1 capsule by mouth daily.    ? colchicine 0.6 MG tablet TAKE 1 TABLET BY MOUTH TWICE A DAY WITH A MEAL AS NEEDED FOR ACUTE FLARE OF GOUT 45 tablet 3  ? empagliflozin (JARDIANCE) 10 MG TABS tablet Take by mouth daily.    ? ezetimibe (ZETIA) 10 MG tablet Take 1 tablet (10 mg total) by mouth daily. 90 tablet 3  ? famotidine (PEPCID) 20 MG tablet TAKE 1 TABLET BY MOUTH EVERYDAY AT BEDTIME 90 tablet 1  ? furosemide (LASIX) 20 MG tablet Take 0.5 tablets (10 mg total) by mouth daily. 90 tablet 3  ? Glucosamine HCl (GLUCOSAMINE PO) Take 1,500 mg by mouth 2 (two) times daily.     ? glucose blood (FREESTYLE LITE) test strip 1 each by Other route 4 (four) times daily. E11.65 400 strip 0  ? HYDROcodone bit-homatropine (HYCODAN) 5-1.5 MG/5ML syrup Take 5 mLs by mouth every 6 (six) hours as needed for cough. 240 mL 0  ? insulin isophane & regular human KwikPen (NOVOLIN 70/30 KWIKPEN) (70-30) 100 UNIT/ML KwikPen Inject 45 Units into the skin daily with breakfast. And pen needles 1/day 45 mL 3  ? INVOKANA 100 MG TABS tablet TAKE 1 TABLET BY MOUTH EVERY OTHER DAY (Patient taking differently: Take 50 mg by mouth  daily before breakfast.) 30 tablet 5  ? isosorbide mononitrate (IMDUR) 30 MG 24 hr tablet Take 1 tablet (30 mg total) by mouth daily. 90 tablet 3  ? Lancets (FREESTYLE) lancets 1 EACH BY OTHER ROUTE 4 (FOUR) TIMES DAILY. E11.65 400 each 0  ? Lutein 20 MG CAPS Take 20 mg by mouth  daily.     ? methocarbamol (ROBAXIN) 500 MG tablet Take 1 tablet (500 mg total) by mouth every 8 (eight) hours as needed for muscle spasms. Caution of sedation 40 tablet 1  ? metoprolol tartrate (LOPRESSOR) 50 MG tablet Take 1 tablet (50 mg total) by mouth 2 (two) times daily. 60 tablet 3  ? Multiple Vitamins-Minerals (ZINC PO) Take 1 tablet by mouth daily.    ? nitroGLYCERIN (NITROSTAT) 0.4 MG SL tablet Place 1 tablet (0.4 mg total) under the tongue every 5 (five) minutes x 3 doses as needed for chest pain. 30 tablet 12  ? omeprazole (PRILOSEC) 40 MG capsule TAKE 1 CAPUSLE BY MOUTH 30- 60 MIN BEFORE YOUR FIRST AND LAST MEALS OF THE DAY 180 capsule 3  ? ondansetron (ZOFRAN) 8 MG tablet Take 1 tablet (8 mg total) by mouth every 8 (eight) hours as needed for nausea or vomiting. From narcotic pain medicine 15 tablet 0  ? polyethylene glycol (MIRALAX / GLYCOLAX) 17 g packet Take 17 g by mouth daily.    ? predniSONE (DELTASONE) 10 MG tablet Take prednisone 30 mg (3 tabs) for 3 days, then continue your maintenance dose of prednisone (Patient taking differently: Take 10 mg by mouth 2 (two) times daily with a meal.) 30 tablet 0  ? rosuvastatin (CRESTOR) 40 MG tablet Take 1 tablet (40 mg total) by mouth daily. 30 tablet 3  ? ?No current facility-administered medications on file prior to visit.  ? ? ?Allergies  ?Allergen Reactions  ? Oxycodone-Acetaminophen Other (See Comments)  ?  Stops breathing; has tolerated Tylenol before  ? ? ?Family History  ?Problem Relation Age of Onset  ? Heart disease Mother   ? Heart disease Father   ? Stroke Father   ? Heart attack Son 84  ? Kidney disease Neg Hx   ? Prostate cancer Neg Hx   ? Diabetes Neg Hx   ? ? ?BP  116/80 (BP Location: Left Arm, Patient Position: Sitting, Cuff Size: Normal)   Pulse 92   Ht '5\' 10"'$  (1.778 m)   Wt 237 lb (107.5 kg)   SpO2 97%   BMI 34.01 kg/m?  ? ? ?Review of Systems ?Denies N/HB.  He denies

## 2021-12-04 ENCOUNTER — Encounter: Payer: Self-pay | Admitting: Endocrinology

## 2021-12-05 DIAGNOSIS — G4733 Obstructive sleep apnea (adult) (pediatric): Secondary | ICD-10-CM | POA: Diagnosis not present

## 2021-12-06 ENCOUNTER — Other Ambulatory Visit: Payer: Self-pay

## 2021-12-06 DIAGNOSIS — N1832 Type 2 diabetes mellitus with diabetic chronic kidney disease: Secondary | ICD-10-CM

## 2021-12-06 MED ORDER — FREESTYLE LIBRE 2 READER DEVI: 0 refills | Status: DC

## 2021-12-06 MED ORDER — FREESTYLE LIBRE 2 SENSOR MISC: 1.0000 | 3 refills | Status: DC

## 2021-12-12 DIAGNOSIS — G4733 Obstructive sleep apnea (adult) (pediatric): Secondary | ICD-10-CM | POA: Diagnosis not present

## 2021-12-16 ENCOUNTER — Encounter: Payer: Self-pay | Admitting: Internal Medicine

## 2021-12-16 ENCOUNTER — Encounter: Payer: Self-pay | Admitting: Endocrinology

## 2021-12-18 ENCOUNTER — Encounter (HOSPITAL_COMMUNITY): Payer: Self-pay

## 2021-12-18 ENCOUNTER — Telehealth: Payer: Self-pay | Admitting: Internal Medicine

## 2021-12-18 ENCOUNTER — Emergency Department (HOSPITAL_COMMUNITY): Payer: PPO

## 2021-12-18 ENCOUNTER — Other Ambulatory Visit: Payer: Self-pay

## 2021-12-18 ENCOUNTER — Emergency Department (HOSPITAL_COMMUNITY)
Admission: EM | Admit: 2021-12-18 | Discharge: 2021-12-19 | Disposition: A | Discharge status: Home / Self Care | Payer: PPO | Attending: Emergency Medicine | Admitting: Emergency Medicine

## 2021-12-18 DIAGNOSIS — Z7982 Long term (current) use of aspirin: Secondary | ICD-10-CM | POA: Insufficient documentation

## 2021-12-18 DIAGNOSIS — Z794 Long term (current) use of insulin: Secondary | ICD-10-CM | POA: Diagnosis not present

## 2021-12-18 DIAGNOSIS — E119 Type 2 diabetes mellitus without complications: Secondary | ICD-10-CM | POA: Diagnosis not present

## 2021-12-18 DIAGNOSIS — R7989 Other specified abnormal findings of blood chemistry: Secondary | ICD-10-CM | POA: Insufficient documentation

## 2021-12-18 DIAGNOSIS — I2510 Atherosclerotic heart disease of native coronary artery without angina pectoris: Secondary | ICD-10-CM | POA: Diagnosis not present

## 2021-12-18 DIAGNOSIS — Z8616 Personal history of COVID-19: Secondary | ICD-10-CM | POA: Insufficient documentation

## 2021-12-18 DIAGNOSIS — R55 Syncope and collapse: Secondary | ICD-10-CM | POA: Insufficient documentation

## 2021-12-18 DIAGNOSIS — W1839XA Other fall on same level, initial encounter: Secondary | ICD-10-CM | POA: Diagnosis not present

## 2021-12-18 DIAGNOSIS — Y9301 Activity, walking, marching and hiking: Secondary | ICD-10-CM | POA: Insufficient documentation

## 2021-12-18 DIAGNOSIS — Y92000 Kitchen of unspecified non-institutional (private) residence as  the place of occurrence of the external cause: Secondary | ICD-10-CM | POA: Diagnosis not present

## 2021-12-18 LAB — CBC
HCT: 38.4 % — ABNORMAL LOW (ref 39.0–52.0)
Hemoglobin: 12.4 g/dL — ABNORMAL LOW (ref 13.0–17.0)
MCH: 33.2 pg (ref 26.0–34.0)
MCHC: 32.3 g/dL (ref 30.0–36.0)
MCV: 102.9 fL — ABNORMAL HIGH (ref 80.0–100.0)
Platelets: 218 10*3/uL (ref 150–400)
RBC: 3.73 MIL/uL — ABNORMAL LOW (ref 4.22–5.81)
RDW: 14.6 % (ref 11.5–15.5)
WBC: 9.8 10*3/uL (ref 4.0–10.5)
nRBC: 0.4 % — ABNORMAL HIGH (ref 0.0–0.2)

## 2021-12-18 LAB — TROPONIN I (HIGH SENSITIVITY)
Troponin I (High Sensitivity): 59 ng/L — ABNORMAL HIGH (ref <18)
Troponin I (High Sensitivity): 51 ng/L — ABNORMAL HIGH (ref <18)

## 2021-12-18 LAB — BASIC METABOLIC PANEL
Anion gap: 9 (ref 5–15)
BUN: 34 mg/dL — ABNORMAL HIGH (ref 8–23)
CO2: 23 mmol/L (ref 22–32)
Calcium: 9.2 mg/dL (ref 8.9–10.3)
Chloride: 107 mmol/L (ref 98–111)
Creatinine, Ser: 2.33 mg/dL — ABNORMAL HIGH (ref 0.61–1.24)
GFR, Estimated: 28 mL/min — ABNORMAL LOW (ref >60)
Glucose, Bld: 161 mg/dL — ABNORMAL HIGH (ref 70–99)
Potassium: 4.4 mmol/L (ref 3.5–5.1)
Sodium: 139 mmol/L (ref 135–145)

## 2021-12-18 LAB — D-DIMER, QUANTITATIVE: D-Dimer, Quant: 0.27 ug/mL-FEU (ref 0.00–0.50)

## 2021-12-18 NOTE — ED Provider Triage Note (Signed)
Emergency Medicine Provider Triage Evaluation Note ? ?Kurt Aguilar , a 76 y.o. male  was evaluated in triage.  Pt complains of syncope. ? ?Review of Systems  ?Positive: Weakness, sob, near syncope and syncope ?Negative: Cp, fever, productive cough, n/v/d ? ?Physical Exam  ?BP (!) 139/95   Pulse 83   Temp 97.8 ?F (36.6 ?C) (Oral)   Resp 18   Ht '5\' 10"'$  (1.778 m)   Wt 107.5 kg   SpO2 97%   BMI 34.01 kg/m?  ?Gen:   Awake, no distress   ?Resp:  Normal effort  ?MSK:   Moves extremities without difficulty  ?Other:   ? ?Medical Decision Making  ?Medically screening exam initiated at 4:24 PM.  Appropriate orders placed.  Kurt Aguilar was informed that the remainder of the evaluation will be completed by another provider, this initial triage assessment does not replace that evaluation, and the importance of remaining in the ED until their evaluation is complete. ? ?Report sob with exertion and weakness upon standing for several weeks, worse in the past several days.  Report O2 drops with ambulation.  Was diagnosed with BOOP in Jan.  ?  ?Domenic Moras, PA-C ?12/18/21 1629 ? ?

## 2021-12-18 NOTE — Telephone Encounter (Signed)
Called and spoke with patient's wife Kurt Aguilar and advised her to take the patient to the nearest emergency room for further evaluation. Advised her that with his BP dropping significantly when he stands and his oxygen dropping may be contributing to him falling/passing out. And that they can do orthostatic vitals in the ED to see if that is going on. She expressed understanding. Nothing further needed at this time.  ?

## 2021-12-18 NOTE — ED Triage Notes (Signed)
Pt arrived via POV. Pt's wife states when the pt stands up his blood pressure drops and his Sa02 drops. Pt's wife states pt had a syncopal episode two days ago upon standing. Per wife, when pt had the syncopal episode and came to, he didn't recognize the wife. VSS at this time. ?

## 2021-12-18 NOTE — Telephone Encounter (Signed)
Advised to take patient to emergency room. ?

## 2021-12-19 NOTE — ED Provider Notes (Signed)
?[facility] ?Provider Note ? ? ?CSN: [id number] ?Arrival date & time: 12/18/21  1606 ? ?  ? ?History ? ?Chief Complaint  ?Patient presents with  ? Loss of Consciousness  ? ? ?Kurt Aguilar is a 76 y.o. male. ? 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  Patient presenting today with complaints of syncope and weakness.  According to the wife who witnessed the syncopal event, he stood to walk into the kitchen when he became lightheaded and fainted, falling on the floor.  He was reported to have been unconscious for less than 1 minute, and he denies significant injury from the fall.  This event occurred 2 days ago.  Wife also reports oxygen saturation dropping into the 80s when he is active and ambulating at home.  He has been seen by Dr. [name] from pulmonology for this, but is not on home oxygen. ? ?The history is provided by the patient.  ?Loss of Consciousness ?Episode history:  Single ?Most recent episode:  2 days ago ?Duration:  1 minute ?Timing:  Constant ?Progression:  Resolved ?Chronicity:  New ?Relieved by:  Nothing ?Worsened by:  Nothing ?Ineffective treatments:  None tried ? ?  ? ?Home Medications ?Prior to Admission medications   ?Medication Sig Start Date End Date Taking? Authorizing Provider  ?acetaminophen (TYLENOL) 650 MG CR tablet Take 650 mg by mouth every 8 (eight) hours as needed for pain.    [provider]  ?albuterol (VENTOLIN HFA) 108 (90 Base) MCG/ACT inhaler Inhale 2 puffs into the lungs every 6 (six) hours as needed for wheezing or shortness of breath. 09/24/21   Rai, Vernelle Emerald, MD  ?allopurinol (ZYLOPRIM) 100 MG tablet Take 1 tablet (100 mg total) by mouth 2 (two) times daily. ?Patient taking differently: Take 100 mg by mouth 2 (two) times daily. Patient states taking '80mg'$  daily 04/12/21   Tower, [name], MD  ?aspirin 81 MG  tablet Take 1 tablet (81 mg total) by mouth 2 (two) times daily after a meal. 05/29/20   Loni Dolly, PA-C  ?benzonatate (TESSALON) 200 MG capsule Take 1 capsule (200 mg total) by mouth 3 (three) times daily as needed for cough. 09/24/21   Rai, Vernelle Emerald, MD  ?Cholecalciferol (VITAMIN D3 PO) Take 1 capsule by mouth daily.    [provider]  ?colchicine 0.6 MG tablet TAKE 1 TABLET BY MOUTH TWICE A DAY WITH A MEAL AS NEEDED FOR ACUTE FLARE OF GOUT 03/16/18   Tower, [name], MD  ?Continuous Blood Gluc Receiver (FREESTYLE LIBRE 2 READER) DEVI Use as instructed to check blood sugars. 12/06/21   Renato Shin, MD  ?Continuous Blood Gluc Sensor (FREESTYLE LIBRE 2 SENSOR) MISC 1 Device by Does not apply route every 14 (fourteen) days. 12/06/21   Renato Shin, MD  ?empagliflozin (JARDIANCE) 10 MG TABS tablet Take by mouth daily.    [provider]  ?ezetimibe (ZETIA) 10 MG tablet Take 1 tablet (10 mg total) by mouth daily. 04/12/21   Tower, [name], MD  ?famotidine (PEPCID) 20 MG tablet TAKE 1 TABLET BY MOUTH EVERYDAY AT BEDTIME 08/21/21   Tanda Rockers, MD  ?furosemide (LASIX) 20 MG tablet Take 0.5 tablets (10 mg total) by mouth daily. 10/21/21 01/19/22  Furth, Cadence H, PA-C  ?Glucosamine HCl (GLUCOSAMINE PO) Take 1,500 mg by mouth 2 (two) times daily.     [provider]  ?glucose blood (FREESTYLE LITE) test strip 1 each by Other route 4 (four) times daily. E11.65 04/10/20   Renato Shin, MD  ?HYDROcodone bit-homatropine (HYCODAN) 5-1.5 MG/5ML syrup Take 5 mLs by mouth every 6 (six) hours as needed for cough. 08/27/21   Deneise Lever, MD  ?insulin isophane & regular human KwikPen (NOVOLIN 70/30 KWIKPEN) (70-30) 100 UNIT/ML KwikPen Inject 45 Units into the skin daily with breakfast. And pen needles 1/day 11/14/21   Renato Shin, MD  ?INVOKANA 100 MG TABS tablet TAKE 1 TABLET BY MOUTH EVERY OTHER DAY ?Patient taking differently: Take 50 mg by mouth daily before breakfast. 09/10/21   Renato Shin, MD   ?isosorbide mononitrate (IMDUR) 30 MG 24 hr tablet Take 1 tablet (30 mg total) by mouth daily. 11/11/21   Minna Merritts, MD  ?Lancets (FREESTYLE) lancets 1 EACH BY OTHER ROUTE 4 (FOUR) TIMES DAILY. E11.65 08/23/20   Renato Shin, MD  ?Lutein 20 MG CAPS Take 20 mg by mouth daily.     [provider]  ?methocarbamol (ROBAXIN) 500 MG tablet Take 1 tablet (500 mg total) by mouth every 8 (eight) hours as needed for muscle spasms. Caution of sedation 05/29/20   Loni Dolly, PA-C  ?metoprolol tartrate (LOPRESSOR) 50 MG tablet Take 1 tablet (50 mg total) by mouth 2 (two) times daily. 09/24/21   Rai, Vernelle Emerald, MD  ?Multiple Vitamins-Minerals (ZINC PO) Take 1 tablet by mouth daily.    [provider]  ?nitroGLYCERIN (NITROSTAT) 0.4 MG SL tablet Place 1 tablet (0.4 mg total) under the tongue every 5 (five) minutes x 3 doses as needed for chest pain. 09/24/21   Rai, Ripudeep K, MD  ?omeprazole (PRILOSEC) 40 MG capsule TAKE 1 CAPUSLE BY MOUTH 30- 60 MIN BEFORE YOUR FIRST AND LAST MEALS OF THE DAY 09/13/21   Tanda Rockers, MD  ?ondansetron (ZOFRAN) 8 MG tablet Take 1 tablet (8 mg total) by mouth every 8 (eight) hours as needed for nausea or vomiting. From narcotic pain medicine 01/03/20   Tower, [name], MD  ?polyethylene glycol (MIRALAX / GLYCOLAX) 17 g packet Take 17 g by mouth daily.    [provider]  ?predniSONE (DELTASONE) 10 MG tablet Take prednisone 30 mg (3 tabs) for 3 days, then continue your maintenance dose of prednisone ?Patient taking differently: Take 10 mg by mouth 2 (two) times daily with a meal. 09/24/21   Rai, Ripudeep K, MD  ?rosuvastatin (CRESTOR) 40 MG tablet Take 1 tablet (40 mg total) by mouth daily. 09/25/21   Rai, Vernelle Emerald, MD  ?Semaglutide, 1 MG/DOSE, 4 MG/3ML SOPN Inject 1 mg as directed once a week. 12/03/21   Renato Shin, MD  ?   ? ?Allergies    ?Oxycodone-acetaminophen   ? ?Review of Systems   ?Review of Systems  ?Cardiovascular:  Positive for syncope.  ?All other  systems reviewed and are negative. ? ?Physical Exam ?Updated Vital Signs ?BP (!) 149/94 (BP Location: Right Arm)   Pulse 74   Temp 97.8 ?F (36.6 ?C) (Oral)   Resp 16   Ht '5\' 10"'$  (1.778 m)   Wt 107.5 kg   SpO2 99%   BMI 34.01 kg/m?  ?Physical Exam ?Vitals and nursing note reviewed.  ?Constitutional:   ?   General: He is not in acute distress. ?   Appearance: He is well-developed. He is not diaphoretic.  ?HENT:  ?   Head: Normocephalic and atraumatic.  ?Eyes:  ?   Extraocular Movements:  Extraocular movements intact.  ?   Pupils: Pupils are equal, round, and reactive to light.  ?Cardiovascular:  ?   Rate and Rhythm: Normal rate and regular rhythm.  ?   Heart sounds: No murmur heard. ?  No friction rub.  ?Pulmonary:  ?   Effort: Pulmonary effort is normal. No respiratory distress.  ?   Breath sounds: Normal breath sounds. No wheezing or rales.  ?Abdominal:  ?   General: Bowel sounds are normal. There is no distension.  ?   Palpations: Abdomen is soft.  ?   Tenderness: There is no abdominal tenderness.  ?Musculoskeletal:     ?   General: Normal range of motion.  ?   Cervical back: Normal range of motion and neck supple.  ?Skin: ?   General: Skin is warm and dry.  ?Neurological:  ?   General: No focal deficit present.  ?   Mental Status: He is alert and oriented to person, place, and time.  ?   Cranial Nerves: No cranial nerve deficit.  ?   Motor: No weakness.  ?   Coordination: Coordination normal.  ? ? ?ED Results / Procedures / Treatments   ?Labs ?(all labs ordered are listed, but only abnormal results are displayed) ?Labs Reviewed  ?BASIC METABOLIC PANEL - Abnormal; Notable for the following components:  ?    Result Value  ? Glucose, Bld 161 (*)   ? BUN 34 (*)   ? Creatinine, Ser 2.33 (*)   ? GFR, Estimated 28 (*)   ? All other components within normal limits  ?CBC - Abnormal; Notable for the following components:  ? RBC 3.73 (*)   ? Hemoglobin 12.4 (*)   ? HCT 38.4 (*)   ? MCV 102.9 (*)   ? nRBC 0.4 (*)   ?  All other components within normal limits  ?TROPONIN I (HIGH SENSITIVITY) - Abnormal; Notable for the following components:  ? Troponin I (High Sensitivity) 59 (*)   ? All other components within normal limits  ?

## 2021-12-19 NOTE — Discharge Instructions (Signed)
Decrease your metoprolol from 50 mg twice daily to 25 mg twice daily. ? ?Follow-up with your primary doctor and pulmonologist in the next week, and return to the ER if symptoms significantly worsen or change. ?

## 2021-12-19 NOTE — ED Notes (Signed)
Patient verbalizes understanding of d/c instructions. Opportunities for questions and answers were provided. Pt d/c from ED and wheeled to lobby with wife.  

## 2021-12-19 NOTE — ED Notes (Signed)
PT STATED THAT HE GETS WINDED WHEN GETTING DRESSED AT HOME WITH HAVING TO BEND OVER AND PT WAS VERY WINDED DOING AN EXERCISE OF PICKING AN OBJECT OFF THE FLOOR ALTHOUGH PT O2 STAT REMAINED 97%  ?

## 2021-12-19 NOTE — ED Notes (Signed)
Pt ambulated and O2 stat remained 98% ?

## 2021-12-23 NOTE — Telephone Encounter (Signed)
Pt seen in ED 12/18/2021. ?Will close encounter.  ?

## 2021-12-24 ENCOUNTER — Encounter: Payer: Self-pay | Admitting: Cardiovascular Disease

## 2021-12-26 ENCOUNTER — Encounter: Payer: Self-pay | Admitting: Cardiovascular Disease

## 2021-12-26 ENCOUNTER — Ambulatory Visit: Payer: PPO | Admitting: Cardiovascular Disease

## 2021-12-26 VITALS — BP 130/76 | HR 80 | Ht 70.0 in | Wt 237.0 lb

## 2021-12-26 DIAGNOSIS — R55 Syncope and collapse: Secondary | ICD-10-CM | POA: Diagnosis not present

## 2021-12-26 DIAGNOSIS — I25708 Atherosclerosis of coronary artery bypass graft(s), unspecified, with other forms of angina pectoris: Secondary | ICD-10-CM | POA: Diagnosis not present

## 2021-12-26 DIAGNOSIS — E1169 Type 2 diabetes mellitus with other specified complication: Secondary | ICD-10-CM

## 2021-12-26 DIAGNOSIS — E1122 Type 2 diabetes mellitus with diabetic chronic kidney disease: Secondary | ICD-10-CM

## 2021-12-26 DIAGNOSIS — D692 Other nonthrombocytopenic purpura: Secondary | ICD-10-CM | POA: Diagnosis not present

## 2021-12-26 DIAGNOSIS — I10 Essential (primary) hypertension: Secondary | ICD-10-CM | POA: Diagnosis not present

## 2021-12-26 DIAGNOSIS — N1832 Chronic kidney disease, stage 3b: Secondary | ICD-10-CM | POA: Diagnosis not present

## 2021-12-26 DIAGNOSIS — I249 Acute ischemic heart disease, unspecified: Secondary | ICD-10-CM | POA: Diagnosis not present

## 2021-12-26 DIAGNOSIS — Z951 Presence of aortocoronary bypass graft: Secondary | ICD-10-CM | POA: Diagnosis not present

## 2021-12-26 DIAGNOSIS — Z794 Long term (current) use of insulin: Secondary | ICD-10-CM | POA: Diagnosis not present

## 2021-12-26 DIAGNOSIS — E785 Hyperlipidemia, unspecified: Secondary | ICD-10-CM

## 2021-12-26 DIAGNOSIS — Z7985 Long-term (current) use of injectable non-insulin antidiabetic drugs: Secondary | ICD-10-CM | POA: Diagnosis not present

## 2021-12-26 DIAGNOSIS — Z7982 Long term (current) use of aspirin: Secondary | ICD-10-CM | POA: Diagnosis not present

## 2021-12-26 DIAGNOSIS — N189 Chronic kidney disease, unspecified: Secondary | ICD-10-CM | POA: Diagnosis not present

## 2021-12-26 DIAGNOSIS — I25118 Atherosclerotic heart disease of native coronary artery with other forms of angina pectoris: Secondary | ICD-10-CM | POA: Diagnosis not present

## 2021-12-26 DIAGNOSIS — E1151 Type 2 diabetes mellitus with diabetic peripheral angiopathy without gangrene: Secondary | ICD-10-CM | POA: Diagnosis not present

## 2021-12-26 DIAGNOSIS — Z7984 Long term (current) use of oral hypoglycemic drugs: Secondary | ICD-10-CM | POA: Diagnosis not present

## 2021-12-26 DIAGNOSIS — G4733 Obstructive sleep apnea (adult) (pediatric): Secondary | ICD-10-CM | POA: Diagnosis not present

## 2021-12-26 MED ORDER — FUROSEMIDE 20 MG PO TABS: ORAL_TABLET | ORAL | 3 refills | Status: DC

## 2021-12-26 MED ORDER — ISOSORBIDE MONONITRATE ER 30 MG PO TB24: ORAL_TABLET | ORAL | 3 refills | Status: DC

## 2021-12-26 NOTE — Patient Instructions (Addendum)
Medication Instructions:  ?- Your physician has recommended you make the following change in your medication:  ? ?1) DECREASE Lasix (furosemide) 20 mg: ?- take 0.5 tablet (10 mg) by mouth once EVERY OTHER day  ? ?Increase fluid intake ? ?2) DECREASE Imdur (isosorbide) 30 mg: ?- take 0.5 tablet (15 mg) by mouth once daily in the evening. ?- you may take an extra 0.5 tablet (15 mg) by mouth once daily as needed for chest pain  ? ?If you need a refill on your cardiac medications before your next appointment, please call your pharmacy.  ? ? ?Lab work: ?No new labs needed ? ? ?Testing/Procedures: ?No new testing needed ? ? ?Follow-Up: ?At [facility], you and your health needs are our priority.  As part of our continuing mission to provide you with exceptional heart care, we have created designated Provider Care Teams.  These Care Teams include your primary Cardiologist (physician) and Advanced Practice Providers (APPs -  Physician Assistants and Nurse Practitioners) who all work together to provide you with the care you need, when you need it. ? ?You will need a follow up appointment in 6 months ? ?Providers on your designated Care Team:   ?Murray Hodgkins, NP ?Christell Faith, PA-C ?Cadence Kathlen Mody, PA-C ? ?COVID-19 Vaccine Information can be found at: [email or web address] For questions related to vaccine distribution or appointments, please email [email or web address] or call [phone].  ? ?

## 2021-12-26 NOTE — Progress Notes (Signed)
Cardiology Office Note ? ?Date:  12/26/2021  ? ?ID:  Kurt Aguilar, DOB Sep 25, 1945, MRN [id number] ? ?PCP:  [name], MD  ? ?Chief Complaint  ?Patient presents with  ? Other  ?  Patient c.o passing out twice in the last few weeks. Patient c.o SOB when changing clothes. Meds reviewed verbally with patient.   ? ? ?HPI:  ?76 yo with history of  ?CAD,  CABG back in 53 in [place], [place], ? HTN,  ?Diabetes with chronic renal insufficiency ?hyperlipidemia,  ?gout ?baseline creatinine mildly elevated ?He lost his son in early 2016 to cardiac arrest ?Aortic valve murmur/aortic valve disease ?h/o reactive airway symptoms, Boop,  ?presents for routine followup of his coronary artery disease, CRI  ? ?LOV 8/22 ?On his last clinic visit we added isosorbide 30 mg daily for anginal symptoms ?Reports he has had improvement in his chest pain on the isosorbide but reports having several episodes of syncope ?Initially try the isosorbide in the morning but had nausea some move the isosorbide to the evening ?Feels very drained in the morning, difficulty getting going ? ?Episodes of syncope x2 happening in the morning ?12/15/21: syncope after getting up out of a chair ?At home ?Took 3 steps then went down, did not hurt himself, recovered on the ground within a short amount of time ?He did go to the emergency room December 19, 2021 for syncope, witnessed syncope in the kitchen, unconscious for 1 minute ?He was told to decrease his metoprolol in half down to 25 twice daily ?BUN/creatinine high end of his range creatinine 2.33 ? ?2 days ago, at dentist, stood up and walked and ambulated , went into wall, near syncope, possibly very short syncope ? ?Orthostatic today, blood pressure down 20 points with standing ?In the ER last week December 19, 2021 for syncope ? ?Take imdur at night, ?Moved it to night secondary to neausea in AM ? ?Reports he remains on Lasix half dose daily ?Denies any significant swelling ?Walks with a cane ? ?EKG personally  reviewed by myself on todays visit ?Normal sinus rhythm rate 80 bpm right bundle branch block old inferior MI ? ?Other past medical history reviewed ?COVID-19 in December (positive on 12/4), underlying history of BOOP ? ?hospitalization January 2023, ?Non-STEMI ?Cardiac catheterization performed ?multivessel native coronary artery diseasesaphenous vein to right coronary artery with 70% ostial to proximal stenosis, SVG to OM 1 and 2 patent, SVG to first diagonal patent, LIMA to LAD patent.  ? ?The saphenous vein graft to the right coronary can be stented although it is not so critical that it should be causing enzyme elevation.  Enzyme elevation likely related to left coronary native disease in the septal perforator territory. ? ?Echo 09/18/21 ?1. Left ventricular ejection fraction, by estimation, is 60 to 65%. The  ?left ventricle has normal function. The left ventricle has no regional  ?wall motion abnormalities. Left ventricular diastolic parameters are  ?consistent with Grade I diastolic  ?dysfunction (impaired relaxation). Elevated left ventricular end-diastolic  ?pressure.  ? 2. Right ventricular systolic function is moderately reduced. The right  ?ventricular size is normal.  ? 3. The mitral valve is normal in structure. No evidence of mitral valve  ?regurgitation. No evidence of mitral stenosis.  ? 4. The AV is poorly visualized to comment on structure. There does appear  ?to be come calcification of the valve. . The aortic valve was not well  ?visualized. Aortic valve regurgitation is not visualized. No aortic  ?stenosis is  present.  ? ?In the hospital treated with prednisone, nebs, cough suppressant ? ?SOB on exertion, ok at rest  ?doing PT 3x a week, rides bike ?Slow improvement ? ?Lab work reviewed ?Significant jump in A1c 6.5 now up to 9.12 September 2021 ?Followed by Dr. Loanne Drilling ?Creatinine 2.18 BUN 43 ? ?h/o reactive airway symptoms, Boop ?PreviouslyStarted on prednisoneMarch 2017  for shortness of breath,  improvement of symptoms,  ?cough returned after prednisone was discontinued ?getting close To his baseline ?Minimal wheezing and cough ?predisone 5 mg daily, Plan may be to wean the dose ?Managed by Dr. [name] , Pulmonary ? ?Other major issue was hemoglobin A1c going up to 11 ?Started On insulin, daily in Am ?  ? ? ?PMH:   has a past medical history of Arthritis, Chronic kidney disease, Complication of anesthesia, Diabetes ([place]), Diverticulitis, GERD (gastroesophageal reflux disease), Gout, Aguilar attack ([place]), Aguilar disease, Aguilar murmur, Hyperglycemia, Hyperlipidemia, Hypertension, Osteoporosis, Pneumonia, and Sleep apnea. ? ?PSH:    ?Past Surgical History:  ?Procedure Laterality Date  ? ACROMIO-CLAVICULAR JOINT REPAIR Left 05/05/2019  ? Procedure: SHOULDER ARTHROSCOPIC ASSISTED ACROMIO-CLAVICULAR JOINT REPAIR;  Surgeon: Tania Ade, MD;  Location: [facility];  Service: Orthopedics;  Laterality: Left;  ? CATARACT EXTRACTION Bilateral   ? CORONARY ARTERY BYPASS GRAFT    ? [place]  ? LEFT Aguilar CATH AND CORS/GRAFTS ANGIOGRAPHY N/A 09/19/2021  ? Procedure: LEFT Aguilar CATH AND CORS/GRAFTS ANGIOGRAPHY;  Surgeon: Belva Crome, MD;  Location: [facility];  Service: Cardiovascular;  Laterality: N/A;  ? open Aguilar surgery  09/09/1995-1998  ? 5 bypass  ? REPAIR KNEE LIGAMENT    ? right  ? SHOULDER ARTHROSCOPY Left 05/05/2019  ? Procedure: ARTHROSCOPY SHOULDER;  Surgeon: Tania Ade, MD;  Location: [facility];  Service: Orthopedics;  Laterality: Left;  ? TONSILLECTOMY    ? TOTAL KNEE ARTHROPLASTY Left 05/29/2020  ? Procedure: LEFT TOTAL KNEE ARTHROPLASTY;  Surgeon: Melrose Nakayama, MD;  Location: [facility];  Service: Orthopedics;  Laterality: Left;  ? ? ?Current Outpatient Medications  ?Medication Sig Dispense Refill  ? acetaminophen (TYLENOL) 650 MG CR tablet Take 650 mg by mouth every 8 (eight) hours as needed for pain.    ? albuterol (VENTOLIN HFA) 108 (90 Base) MCG/ACT inhaler Inhale 2 puffs into the lungs  every 6 (six) hours as needed for wheezing or shortness of breath. 8 g 2  ? allopurinol (ZYLOPRIM) 100 MG tablet Take 1 tablet (100 mg total) by mouth 2 (two) times daily. 180 tablet 3  ? aspirin 81 MG tablet Take 1 tablet (81 mg total) by mouth 2 (two) times daily after a meal. 30 tablet 0  ? benzonatate (TESSALON) 200 MG capsule Take 1 capsule (200 mg total) by mouth 3 (three) times daily as needed for cough. 30 capsule 1  ? Cholecalciferol (VITAMIN D3 PO) Take 1 capsule by mouth daily.    ? colchicine 0.6 MG tablet TAKE 1 TABLET BY MOUTH TWICE A DAY WITH A MEAL AS NEEDED FOR ACUTE FLARE OF GOUT 45 tablet 3  ? Continuous Blood Gluc Receiver (FREESTYLE LIBRE 2 READER) DEVI Use as instructed to check blood sugars. 1 each 0  ? Continuous Blood Gluc Sensor (FREESTYLE LIBRE 2 SENSOR) MISC 1 Device by Does not apply route every 14 (fourteen) days. 6 each 3  ? empagliflozin (JARDIANCE) 10 MG TABS tablet Take by mouth daily.    ? ezetimibe (ZETIA) 10 MG tablet Take 1 tablet (10 mg total) by mouth daily. 90 tablet  3  ? famotidine (PEPCID) 20 MG tablet TAKE 1 TABLET BY MOUTH EVERYDAY AT BEDTIME 90 tablet 1  ? furosemide (LASIX) 20 MG tablet Take 0.5 tablets (10 mg total) by mouth daily. 90 tablet 3  ? Glucosamine HCl (GLUCOSAMINE PO) Take 1,500 mg by mouth 2 (two) times daily.     ? glucose blood (FREESTYLE LITE) test strip 1 each by Other route 4 (four) times daily. E11.65 400 strip 0  ? HYDROcodone bit-homatropine (HYCODAN) 5-1.5 MG/5ML syrup Take 5 mLs by mouth every 6 (six) hours as needed for cough. 240 mL 0  ? insulin isophane & regular human KwikPen (NOVOLIN 70/30 KWIKPEN) (70-30) 100 UNIT/ML KwikPen Inject 45 Units into the skin daily with breakfast. And pen needles 1/day 45 mL 3  ? INVOKANA 100 MG TABS tablet TAKE 1 TABLET BY MOUTH EVERY OTHER DAY 30 tablet 5  ? isosorbide mononitrate (IMDUR) 30 MG 24 hr tablet Take 1 tablet (30 mg total) by mouth daily. 90 tablet 3  ? Lancets (FREESTYLE) lancets 1 EACH BY OTHER  ROUTE 4 (FOUR) TIMES DAILY. E11.65 400 each 0  ? Lutein 20 MG CAPS Take 20 mg by mouth daily.     ? methocarbamol (ROBAXIN) 500 MG tablet Take 1 tablet (500 mg total) by mouth every 8 (eight) hours as need

## 2021-12-28 ENCOUNTER — Other Ambulatory Visit: Payer: Self-pay | Admitting: Internal Medicine

## 2022-01-05 ENCOUNTER — Encounter: Payer: Self-pay | Admitting: Endocrinology

## 2022-01-06 ENCOUNTER — Other Ambulatory Visit: Payer: Self-pay | Admitting: Endocrinology

## 2022-01-06 MED ORDER — EMPAGLIFLOZIN 10 MG PO TABS: 10.0000 mg | ORAL_TABLET | Freq: Every day | ORAL | 1 refills | Status: DC

## 2022-01-09 ENCOUNTER — Ambulatory Visit (INDEPENDENT_AMBULATORY_CARE_PROVIDER_SITE_OTHER): Payer: PPO | Admitting: Endocrinology

## 2022-01-09 ENCOUNTER — Other Ambulatory Visit (HOSPITAL_COMMUNITY): Payer: Self-pay

## 2022-01-09 ENCOUNTER — Encounter: Payer: Self-pay | Admitting: Endocrinology

## 2022-01-09 VITALS — BP 110/76 | HR 91 | Ht 70.0 in | Wt 237.2 lb

## 2022-01-09 DIAGNOSIS — E1122 Type 2 diabetes mellitus with diabetic chronic kidney disease: Secondary | ICD-10-CM

## 2022-01-09 DIAGNOSIS — N1832 Chronic kidney disease, stage 3b: Secondary | ICD-10-CM

## 2022-01-09 LAB — POCT GLYCOSYLATED HEMOGLOBIN (HGB A1C): Hemoglobin A1C: 6.7 % — AB (ref 4.0–5.6)

## 2022-01-09 MED ORDER — NOVOLIN 70/30 FLEXPEN (70-30) 100 UNIT/ML ~~LOC~~ SUPN: 30.0000 [IU] | PEN_INJECTOR | Freq: Every day | SUBCUTANEOUS | 1 refills | Status: DC

## 2022-01-09 MED ORDER — OZEMPIC (2 MG/DOSE) 8 MG/3ML ~~LOC~~ SOPN: 2.0000 mg | PEN_INJECTOR | SUBCUTANEOUS | 3 refills | Status: DC

## 2022-01-09 NOTE — Progress Notes (Signed)
? ?Subjective:  ? ? Patient ID: Kurt Aguilar, male    DOB: [birth year], 76 y.o.   MRN: [id number] ? ?HPI ?Pt returns for f/u of diabetes mellitus:  ?DM type: Insulin-requiring type 2 ?Dx'ed: 2014 ?Complications: PN, stage 3b CRI, and CAD.   ?Therapy: insulin since 2023, and Invokana.   ?DKA: never.  ?Severe hypoglycemia: never.  ?Pancreatitis: never.  ?Other: he takes intermitt prednisone for BOOP; renal insuff limits rx options; he also took insulin 2018-2019.   ?Interval history: no change in chronic prednisone (5 mg/d).  I reviewed continuous glucose monitor data.  Glucose varies from 85-240.  It is in general highest at 9PM, and less high at 10AM.  It is lowest at [facility].  It decreases overnight.   He takes 40 units qam.   ?Past Medical History:  ?Diagnosis Date  ? Arthritis   ? Chronic kidney disease   ? Complication of anesthesia   ? constipation and inability to  urinate happened a few days after cataract surgery   ? Diabetes ([place])   ? type 2   ? Diverticulitis   ? GERD (gastroesophageal reflux disease)   ? Gout   ? Heart attack ([place])   ? Heart disease   ? Heart murmur   ? Hyperglycemia   ? Hyperlipidemia   ? Hypertension   ? Osteoporosis   ? Pneumonia   ? hx of BOOP followed by Dr [name]   ? Sleep apnea   ? cpapp - setting at 17   ? ? ?Past Surgical History:  ?Procedure Laterality Date  ? ACROMIO-CLAVICULAR JOINT REPAIR Left 05/05/2019  ? Procedure: SHOULDER ARTHROSCOPIC ASSISTED ACROMIO-CLAVICULAR JOINT REPAIR;  Surgeon: Tania Ade, MD;  Location: [facility];  Service: Orthopedics;  Laterality: Left;  ? CATARACT EXTRACTION Bilateral   ? CORONARY ARTERY BYPASS GRAFT    ? [place]  ? LEFT HEART CATH AND CORS/GRAFTS ANGIOGRAPHY N/A 09/19/2021  ? Procedure: LEFT HEART CATH AND CORS/GRAFTS ANGIOGRAPHY;  Surgeon: Belva Crome, MD;  Location: [facility];  Service: Cardiovascular;  Laterality: N/A;  ? open heart surgery  09/09/1995-1998  ? 5 bypass  ? REPAIR KNEE LIGAMENT    ? right  ? SHOULDER  ARTHROSCOPY Left 05/05/2019  ? Procedure: ARTHROSCOPY SHOULDER;  Surgeon: Tania Ade, MD;  Location: [facility];  Service: Orthopedics;  Laterality: Left;  ? TONSILLECTOMY    ? TOTAL KNEE ARTHROPLASTY Left 05/29/2020  ? Procedure: LEFT TOTAL KNEE ARTHROPLASTY;  Surgeon: Melrose Nakayama, MD;  Location: [facility];  Service: Orthopedics;  Laterality: Left;  ? ? ?Social History  ? ?Socioeconomic History  ? Marital status: Married  ?  Spouse name: Not on file  ? Number of children: 4  ? Years of education: 10  ? Highest education level: Not on file  ?Occupational History  ? Occupation: Regulatory affairs officer  ?  Employer: RETIRED  ?Tobacco Use  ? Smoking status: Former  ?  Packs/day: 1.00  ?  Years: 3.00  ?  Pack years: 3.00  ?  Types: Cigars, Cigarettes  ?  Quit date: 11/25/2004  ?  Years since quitting: 17.1  ? Smokeless tobacco: Never  ? Tobacco comments:  ?  occas. cigar quit 2006  ?Vaping Use  ? Vaping Use: Never used  ?Substance and Sexual Activity  ? Alcohol use: Yes  ?  Alcohol/week: 1.0 standard drink  ?  Types: 1 Standard drinks or equivalent per week  ?  Comment: rare  ? Drug use: No  ?  Sexual activity: Never  ?Other Topics Concern  ? Not on file  ?Social History Narrative  ? Not on file  ? ?Social Determinants of Health  ? ?Financial Resource Strain: Low Risk   ? Difficulty of Paying Living Expenses: Not hard at all  ?Food Insecurity: No Food Insecurity  ? Worried About Charity fundraiser in the Last Year: Never true  ? Ran Out of Food in the Last Year: Never true  ?Transportation Needs: No Transportation Needs  ? Lack of Transportation (Medical): No  ? Lack of Transportation (Non-Medical): No  ?Physical Activity: Sufficiently Active  ? Days of Exercise per Week: 4 days  ? Minutes of Exercise per Session: 50 min  ?Stress: No Stress Concern Present  ? Feeling of Stress : Not at all  ?Social Connections: Not on file  ?Intimate Partner Violence: Not At Risk  ? Fear of Current or Ex-Partner: No  ? Emotionally Abused: No   ? Physically Abused: No  ? Sexually Abused: No  ? ? ?Current Outpatient Medications on File Prior to Visit  ?Medication Sig Dispense Refill  ? acetaminophen (TYLENOL) 650 MG CR tablet Take 650 mg by mouth every 8 (eight) hours as needed for pain.    ? albuterol (VENTOLIN HFA) 108 (90 Base) MCG/ACT inhaler Inhale 2 puffs into the lungs every 6 (six) hours as needed for wheezing or shortness of breath. 8 g 2  ? allopurinol (ZYLOPRIM) 100 MG tablet Take 1 tablet (100 mg total) by mouth 2 (two) times daily. 180 tablet 3  ? aspirin 81 MG tablet Take 1 tablet (81 mg total) by mouth 2 (two) times daily after a meal. 30 tablet 0  ? benzonatate (TESSALON) 200 MG capsule Take 1 capsule (200 mg total) by mouth 3 (three) times daily as needed for cough. 30 capsule 1  ? Cholecalciferol (VITAMIN D3 PO) Take 1 capsule by mouth daily.    ? colchicine 0.6 MG tablet TAKE 1 TABLET BY MOUTH TWICE A DAY WITH A MEAL AS NEEDED FOR ACUTE FLARE OF GOUT 45 tablet 3  ? Continuous Blood Gluc Receiver (FREESTYLE LIBRE 2 READER) DEVI Use as instructed to check blood sugars. 1 each 0  ? Continuous Blood Gluc Sensor (FREESTYLE LIBRE 2 SENSOR) MISC 1 Device by Does not apply route every 14 (fourteen) days. 6 each 3  ? empagliflozin (JARDIANCE) 10 MG TABS tablet Take 1 tablet (10 mg total) by mouth daily. 90 tablet 1  ? ezetimibe (ZETIA) 10 MG tablet Take 1 tablet (10 mg total) by mouth daily. 90 tablet 3  ? famotidine (PEPCID) 20 MG tablet TAKE 1 TABLET BY MOUTH EVERYDAY AT BEDTIME 90 tablet 1  ? furosemide (LASIX) 20 MG tablet Take 0.5 tablet (10 mg) by mouth once EVERY OTHER day 90 tablet 3  ? Glucosamine HCl (GLUCOSAMINE PO) Take 1,500 mg by mouth 2 (two) times daily.     ? glucose blood (FREESTYLE LITE) test strip 1 each by Other route 4 (four) times daily. E11.65 400 strip 0  ? HYDROcodone bit-homatropine (HYCODAN) 5-1.5 MG/5ML syrup Take 5 mLs by mouth every 6 (six) hours as needed for cough. 240 mL 0  ? INVOKANA 100 MG TABS tablet TAKE 1  TABLET BY MOUTH EVERY OTHER DAY 30 tablet 5  ? isosorbide mononitrate (IMDUR) 30 MG 24 hr tablet Take 0.5 tablet (15 mg) by mouth once daily in the evening. You may take an extra 0.5 tablet (15 mg) by mouth once daily as needed for  chest pain 90 tablet 3  ? Lancets (FREESTYLE) lancets 1 EACH BY OTHER ROUTE 4 (FOUR) TIMES DAILY. E11.65 400 each 0  ? Lutein 20 MG CAPS Take 20 mg by mouth daily.     ? methocarbamol (ROBAXIN) 500 MG tablet Take 1 tablet (500 mg total) by mouth every 8 (eight) hours as needed for muscle spasms. Caution of sedation 40 tablet 1  ? metoprolol tartrate (LOPRESSOR) 25 MG tablet Take 25 mg by mouth 2 (two) times daily.    ? Multiple Vitamins-Minerals (ZINC PO) Take 1 tablet by mouth daily.    ? nitroGLYCERIN (NITROSTAT) 0.4 MG SL tablet Place 1 tablet (0.4 mg total) under the tongue every 5 (five) minutes x 3 doses as needed for chest pain. 30 tablet 12  ? omeprazole (PRILOSEC) 40 MG capsule TAKE 1 CAPUSLE BY MOUTH 30- 60 MIN BEFORE YOUR FIRST AND LAST MEALS OF THE DAY 180 capsule 3  ? ondansetron (ZOFRAN) 8 MG tablet Take 1 tablet (8 mg total) by mouth every 8 (eight) hours as needed for nausea or vomiting. From narcotic pain medicine 15 tablet 0  ? polyethylene glycol (MIRALAX / GLYCOLAX) 17 g packet Take 17 g by mouth daily.    ? predniSONE (DELTASONE) 10 MG tablet Take prednisone 30 mg (3 tabs) for 3 days, then continue your maintenance dose of prednisone 30 tablet 0  ? rosuvastatin (CRESTOR) 40 MG tablet Take 1 tablet (40 mg total) by mouth daily. 30 tablet 3  ? ?No current facility-administered medications on file prior to visit.  ? ? ?Allergies  ?Allergen Reactions  ? Oxycodone-Acetaminophen Other (See Comments)  ?  Stops breathing; has tolerated Tylenol before  ? ? ?Family History  ?Problem Relation Age of Onset  ? Heart disease Mother   ? Heart disease Father   ? Stroke Father   ? Heart attack Son 29  ? Kidney disease Neg Hx   ? Prostate cancer Neg Hx   ? Diabetes Neg Hx   ? ? ?BP  110/76 (BP Location: Left Arm, Patient Position: Sitting, Cuff Size: Normal)   Pulse 91   Ht '5\' 10"'$  (1.778 m)   Wt 237 lb 3.2 oz (107.6 kg)   SpO2 99%   BMI 34.03 kg/m?  ? ? ?Review of Systems ?Nausea is mild.

## 2022-01-09 NOTE — Patient Instructions (Addendum)
I have sent a prescription to your pharmacy, to increase the Ozempic again.   ?Please reduce the 70/30 insulin to 30 units with breakfast.   ?check your blood sugar twice a day.  vary the time of day when you check, between before the 3 meals, and at bedtime.  also check if you have symptoms of your blood sugar being too high or too low.  please keep a record of the readings and bring it to your next appointment here (or you can bring the meter itself).  You can write it on any piece of paper.  please call us sooner if your blood sugar goes below 70, or if most of your readings are over 200.   ?You should have an endocrinology follow-up appointment in 3 months.   ?

## 2022-01-10 ENCOUNTER — Other Ambulatory Visit (HOSPITAL_COMMUNITY): Payer: Self-pay

## 2022-01-10 ENCOUNTER — Telehealth: Payer: Self-pay | Admitting: Pharmacy Technician

## 2022-01-10 NOTE — Telephone Encounter (Signed)
Patient Advocate Encounter ?  ?Received notification that prior authorization for Ozempic (2 MG/DOSE) '8MG'$ /3ML pen-injectors is required. ?  ?PA submitted on 01/10/2022 via call-in service for [employer] ?Request ID: [id number] ?Status is pending ?   ? ? ?Lady Deutscher, CPhT-Adv ?Pharmacy Patient Advocate Specialist ?[facility] Patient Advocate Team ?Direct Number: [phone]  Fax: [phone] ? ?

## 2022-01-11 ENCOUNTER — Other Ambulatory Visit: Payer: Self-pay | Admitting: Endocrinology

## 2022-01-11 DIAGNOSIS — E1122 Type 2 diabetes mellitus with diabetic chronic kidney disease: Secondary | ICD-10-CM

## 2022-01-14 ENCOUNTER — Other Ambulatory Visit (HOSPITAL_COMMUNITY): Payer: Self-pay

## 2022-01-14 NOTE — Telephone Encounter (Signed)
Patient Advocate Encounter ? ?Prior Authorization for Ozempic has been approved.   ? ?PA# request ID [id number] ?Effective dates: 01/12/22 through 09/07/22 ? ?Per Test Claim Patients co-pay is $228.44 for 28 day supply, $659.86 for 84 day supply.  ? ?Spoke with Pharmacy to Process. Pt's previous strengths weren't as expensive. Pt may be in donut hole. Pharmacy will inform pt and order med.  ? ?Patient Advocate ?Fax:  [phone] ? ?

## 2022-01-15 ENCOUNTER — Ambulatory Visit: Payer: PPO | Admitting: Nurse Practitioner

## 2022-01-22 DIAGNOSIS — I10 Essential (primary) hypertension: Secondary | ICD-10-CM | POA: Diagnosis not present

## 2022-01-22 DIAGNOSIS — Z7982 Long term (current) use of aspirin: Secondary | ICD-10-CM | POA: Diagnosis not present

## 2022-01-22 DIAGNOSIS — E1122 Type 2 diabetes mellitus with diabetic chronic kidney disease: Secondary | ICD-10-CM | POA: Diagnosis not present

## 2022-01-22 DIAGNOSIS — Z7984 Long term (current) use of oral hypoglycemic drugs: Secondary | ICD-10-CM | POA: Diagnosis not present

## 2022-01-22 DIAGNOSIS — Z951 Presence of aortocoronary bypass graft: Secondary | ICD-10-CM | POA: Diagnosis not present

## 2022-01-22 DIAGNOSIS — D692 Other nonthrombocytopenic purpura: Secondary | ICD-10-CM | POA: Diagnosis not present

## 2022-01-22 DIAGNOSIS — N189 Chronic kidney disease, unspecified: Secondary | ICD-10-CM | POA: Diagnosis not present

## 2022-01-22 DIAGNOSIS — Z794 Long term (current) use of insulin: Secondary | ICD-10-CM | POA: Diagnosis not present

## 2022-01-22 DIAGNOSIS — I25118 Atherosclerotic heart disease of native coronary artery with other forms of angina pectoris: Secondary | ICD-10-CM | POA: Diagnosis not present

## 2022-01-22 DIAGNOSIS — Z7985 Long-term (current) use of injectable non-insulin antidiabetic drugs: Secondary | ICD-10-CM | POA: Diagnosis not present

## 2022-01-22 DIAGNOSIS — E1151 Type 2 diabetes mellitus with diabetic peripheral angiopathy without gangrene: Secondary | ICD-10-CM | POA: Diagnosis not present

## 2022-01-30 DIAGNOSIS — M5451 Vertebrogenic low back pain: Secondary | ICD-10-CM | POA: Diagnosis not present

## 2022-01-30 DIAGNOSIS — M545 Low back pain, unspecified: Secondary | ICD-10-CM | POA: Insufficient documentation

## 2022-02-06 ENCOUNTER — Telehealth: Payer: Self-pay

## 2022-02-06 NOTE — Telephone Encounter (Signed)
We have not had any luck so far with availability locally.  There is a practice on [address] in [place] ([facility]) the he can call to inquire availability but my understanding is that everyone if full until oct or later. Velora Heckler is hiring so I'm not sure how long that will take.  Did Dr Loanne Drilling give him any ideas?   If not then would have to look at [facility] or [place] or [facility] in [facility]

## 2022-02-06 NOTE — Telephone Encounter (Signed)
Patient's wife is calling in stating Dr.Ellison, Endocrinologist has left [place]. Mj wants to know if there is an endocrinologist locally that they can get a referral to.

## 2022-02-10 ENCOUNTER — Telehealth: Payer: Self-pay

## 2022-02-10 NOTE — Telephone Encounter (Signed)
Patient is calling in asking for a referral to a new endocrinologist as Dr.Ellison has retired. Asking for somewhere local if possible.

## 2022-02-11 NOTE — Telephone Encounter (Signed)
Called and spoke with pt and informed him of this information, advised pt to contact  Dr Loanne Drilling office regarding of how will be cover his pt there since he will no longer be with pt. Possibly one of Provide there may be take on his patient . Pt  is going give  Endo a call.

## 2022-02-14 ENCOUNTER — Ambulatory Visit: Payer: PPO | Admitting: Internal Medicine

## 2022-02-14 ENCOUNTER — Telehealth: Payer: Self-pay

## 2022-02-14 ENCOUNTER — Other Ambulatory Visit
Admission: RE | Admit: 2022-02-14 | Discharge: 2022-02-14 | Disposition: A | Discharge status: Home / Self Care | Payer: PPO | Attending: Internal Medicine | Admitting: Internal Medicine

## 2022-02-14 ENCOUNTER — Other Ambulatory Visit: Payer: Self-pay

## 2022-02-14 ENCOUNTER — Encounter: Payer: Self-pay | Admitting: Internal Medicine

## 2022-02-14 DIAGNOSIS — E1169 Type 2 diabetes mellitus with other specified complication: Secondary | ICD-10-CM | POA: Diagnosis not present

## 2022-02-14 DIAGNOSIS — R918 Other nonspecific abnormal finding of lung field: Secondary | ICD-10-CM | POA: Diagnosis not present

## 2022-02-14 DIAGNOSIS — E785 Hyperlipidemia, unspecified: Secondary | ICD-10-CM | POA: Diagnosis not present

## 2022-02-14 LAB — CBC WITH DIFFERENTIAL/PLATELET
Abs Immature Granulocytes: 0.2 10*3/uL — ABNORMAL HIGH (ref 0.00–0.07)
Basophils Absolute: 0.1 10*3/uL (ref 0.0–0.1)
Basophils Relative: 1 %
Eosinophils Absolute: 0.1 10*3/uL (ref 0.0–0.5)
Eosinophils Relative: 1 %
HCT: 44 % (ref 39.0–52.0)
Hemoglobin: 14.3 g/dL (ref 13.0–17.0)
Immature Granulocytes: 2 %
Lymphocytes Relative: 17 %
Lymphs Abs: 2.1 10*3/uL (ref 0.7–4.0)
MCH: 32 pg (ref 26.0–34.0)
MCHC: 32.5 g/dL (ref 30.0–36.0)
MCV: 98.4 fL (ref 80.0–100.0)
Monocytes Absolute: 0.9 10*3/uL (ref 0.1–1.0)
Monocytes Relative: 7 %
Neutro Abs: 8.8 10*3/uL — ABNORMAL HIGH (ref 1.7–7.7)
Neutrophils Relative %: 72 %
Platelets: 220 10*3/uL (ref 150–400)
RBC: 4.47 MIL/uL (ref 4.22–5.81)
RDW: 14.6 % (ref 11.5–15.5)
WBC: 12.1 10*3/uL — ABNORMAL HIGH (ref 4.0–10.5)
nRBC: 0 % (ref 0.0–0.2)

## 2022-02-14 LAB — LIPID PANEL
Cholesterol: 135 mg/dL (ref 0–200)
HDL: 49 mg/dL (ref >40)
LDL Cholesterol: 33 mg/dL (ref 0–99)
Total CHOL/HDL Ratio: 2.8 RATIO
Triglycerides: 265 mg/dL — ABNORMAL HIGH (ref <150)
VLDL: 53 mg/dL — ABNORMAL HIGH (ref 0–40)

## 2022-02-14 LAB — SEDIMENTATION RATE: Sed Rate: 13 mm/hr (ref 0–20)

## 2022-02-14 MED ORDER — PREDNISONE 10 MG PO TABS: ORAL_TABLET | ORAL | 0 refills | Status: DC

## 2022-02-14 NOTE — Telephone Encounter (Signed)
Please see other encounter ,  spoke with pt regarding this.

## 2022-02-14 NOTE — Patient Instructions (Signed)
Remember ceiling for prednisone is 20 mg and floor is 5 mg daily  Please remember to go to the lab department   for your tests - we will call you with the results when they are available.      Please schedule a follow up visit in 3 months but call sooner if needed

## 2022-02-14 NOTE — Progress Notes (Signed)
Subjective:   Patient ID: Kurt Aguilar, male    DOB: [birth year]  MRN: [id number]    Brief patient profile:  78 yowm never cigarette smoker (just some cigars)  after cabg in [place] started noting recurrent winter cough regardless of where located in [place] ([place]/ [place]) with typical acute episode in [place] in Feb 2017  This  cough  more dry less severe than previous >  abx and pred > improved but did not resolve and worse when stopped it so restarted pred/abx > dx as pneumonia with variable as dz > referred to pulmonary clinic 12/19/2015 by [name] with 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   History of Present Illness  12/19/2015 1st [facility] office visit/ Kurt Aguilar   Chief Complaint  Patient presents with   Pulmonary Consult    Referred by [name]. Pt c/o cough x 4 wks- non prod and worse in the evening and when he lies down. Talking and exertion are things that trigger the cough. He has been on pred x 2 and both times cough resolved and immediately returns once done with med. He also c/o SOB- gets winded just walking from room to room at home.   while on prednisone still some weak and sob but better cough and last dose at least one week and worse overall since off it assoc with sob room to room and weakness/ excess/ purulent sputum or mucus plugs / no unusual exp / no ctd.  Has new CPAP machine fall 2016  rec  Omeprazole 20 mg Take 30- 60 min before your first and last meals of the day  Prednisone 10  X 2 each day until 100% better then 10 mg daily  Dx is Brochilitis obliterans with organizing pneumonia vs eosiniphic pneumonia most likely diagnosis GERD  Diet    09/11/2016  f/u ov/Kurt Aguilar re: BOOP/uacs  on pred 10/5  prilosec 20 mg ac and hs  Chief Complaint  Patient presents with   Follow-up    Increased SOB for the past 3-4 days. He gets SOB with exertion such as getting dressed or walking accross the room.  He has also started coughing more "feels like I burned my  lungs"- prod with white sputum.     was doing ok on 10/5 in terms of breathing but worse gradually x one week p several weeks on the 10/5 dose  Cough some better but still needing freq tessalon on gabapentin 100 tid  rec Prednisone 10 mg  2 daily until better then 1 daily x 2 weeks then 10 alternating with 5 until return Prilosec should be 20 mg Take 30- 60 min before your first and last meals of the day  Increase gabapentin to 300 mg three times daily         Symptoms onset Dec 4th 2022  [place] 08/26/2021  acute visit  Prometh codeine    cough syrup Benzonatate perles- for cough Molnupiravir rx  and zpak  >>>  walking room to room at home/ cough never improved and pred rx up to 40 mg   Admit date:     09/17/2021   Discharge date: 09/24/21  Discharge Physician: [name]     Discharge Diagnoses     Acute coronary syndrome / NSTEMI(HCC) Acute kidney injury on CKD stage IIIb Lactic acidosis Shortness of breath with coughing   Essential hypertension   Right bundle branch block   CAD (coronary artery disease)   DM2 (diabetes mellitus, type 2) (  [place])   Steroid dependent (HCC)   AKI (acute kidney injury) ([place])   Stage 3b chronic kidney disease (CKD) [facility])         Hospital Course     Patient is a 76 year old male with history of CAD status post CABG in 1997, DM2, HTN, HLD, Boop, recent COVID-19 infection in December 2022, chronic steroid dependency presented to ED with mild intermittent chest pain for a month, worse with exertion, dyspnea.  Patient was found to have elevated troponins and was admitted for further work-up    NSTEMI [facility]) with known history of CAD status post CABG x5 in 1997 -2D echo showed no wall motion abnormality, normal EF, likely demand ischemia -Left heart cardiac cath showed multivessel native coronary artery diseasesaphenous vein to right coronary artery with 70% ostial to proximal stenosis, SVG to OM 1 and 2 patent, SVG to first diagonal patent,  LIMA to LAD patent.  -Continue aspirin, beta-blocker, Crestor, Zetia,       Essential hypertension -BP stable, continue beta-blocker    Hyperkalemia -Resolved   Acute on chronic CKD stage IIIb, lactic acidosis -Creatinine 2.34 on admission, baseline around 1.8-1.9 -Creatinine improved, close to baseline.  Completed IV fluid hydration.      Shortness of breath with cough -Per patient 4 weeks, dry persistent cough, possibly due to COVID-19 in December (positive on 12/4), underlying history of BOOP -CXR with no pneumonia or effusion.  VQ scan negative for PE.  Normal BNP -Continues to feel winded with exertion, coughing spasms. -Continue prednisone 40 mg daily with a taper upon discharge. (Outpatient on 10 mg BID) -Continue scheduled nebs, incentive spirometry, Tessalon Perles  -Home O2 evaluation done on 1/15, did not meet criteria for home O2   Hyperlipidemia -LDL 74 in 04/2021.  Continue Crestor 40 mg daily, Zetia 10 mg daily   Diabetes mellitus type 2, on long-term insulin with renal complications -Hemoglobin A1c 9.5 on 09/13/2021 --Continue insulin regimen, hopefully CBGs will continue to improve once prednisone taper is completed and back to maintenance dose   BOOP - follows Dr [name] outpatient, continue prednisone     Obesity Estimated body mass index is 32.39 kg/m as calculated from the following:   Height as of this encounter: '5\' 10"'$  (1.778 m).   Weight as of this encounter: 102.4 kg.  10/04/2021  f/u ov/Kurt Aguilar/ [facility] re: sob/ cough/ weak    maint on 20 mg prednisone   Chief Complaint  Patient presents with   Follow-up   Dyspnea:  SNF x 50 ft with walker/belt and one PT lowest sats 91%  but now anemic Cough: improving  Sleeping: 30 degrees and cpap  SABA use: none  02: none  Covid status:   vax x  5 Cp similar to angina p bm's x  7 min  Rec Encourage you to go ahead and use the cough medication as needed  Leave prednisone at 20 mg until better but goal  is to get it back 5 mg baseline      11/15/2021  f/u ov/Kurt Aguilar/ [facility] re: BOOP  maint on Prednisone 20 mg   Chief Complaint  Patient presents with   Follow-up  Dyspnea:  walked with PT  for 6 min moderate with cp toward the end but did not check sats/ cp always better p ntg immediately / cards aware and now on long acting nitrates  Cough: better  Sleeping: 30 degrees cpap no 02  SABA use: none 02: none  Covid status:  vax x 5   Rec Prednisone 20 mg each am it a floor of 10 mg starting with 20 /15 alternate days for a full week - go back to the previous dose that worked and do the taper 2 weeks at a time Check 02 sats at peak exertion, not after you stop with goal of keeping over 90%      02/14/2022  f/u ov/Kurt Aguilar/ [facility] re: BOOP    maint on 5 mg / day pred  variable nausea but no new doe  Chief Complaint  Patient presents with   Follow-up    SOB with exertion, dry cough and occ wheezing.    Dyspnea:  recumbent bike x 12 min  resistance of 5 / no cp goal is 30 / at least 5 d a week Cough: none  Sleeping: 30 degrees/ cpap/ no 02 SABA use: none 02: none  Covid status:   vax x 5    No obvious day to day or daytime variability or assoc excess/ purulent sputum or mucus plugs or hemoptysis or cp or chest tightness, subjective wheeze or overt sinus or hb symptoms.   Sleeping  without nocturnal  or early am exacerbation  of respiratory  c/o's or need for noct saba. Also denies any obvious fluctuation of symptoms with weather or environmental changes or other aggravating or alleviating factors except as outlined above   No unusual exposure hx or h/o childhood pna/ asthma or knowledge of premature birth.  Current Allergies, Complete Past Medical History, Past Surgical History, Family History, and Social History were reviewed in [employer] record.  ROS  The following are not active complaints unless bolded Hoarseness, sore throat, dysphagia,  dental problems, itching, sneezing,  nasal congestion or discharge of excess mucus or purulent secretions, ear ache,   fever, chills, sweats, unintended wt loss or wt gain, classically pleuritic or exertional cp,  orthopnea pnd or arm/hand swelling  or leg swelling, presyncope, palpitations, abdominal pain, anorexia, nausea, vomiting, diarrhea  or change in bowel habits or change in bladder habits, change in stools or change in urine, dysuria, hematuria,  rash, arthralgias, visual complaints, headache, numbness, weakness or ataxia or problems with walking or coordination,  change in mood or  memory.        Current Meds  Medication Sig   acetaminophen (TYLENOL) 650 MG CR tablet Take 650 mg by mouth every 8 (eight) hours as needed for pain.   albuterol (VENTOLIN HFA) 108 (90 Base) MCG/ACT inhaler Inhale 2 puffs into the lungs every 6 (six) hours as needed for wheezing or shortness of breath.   allopurinol (ZYLOPRIM) 100 MG tablet Take 1 tablet (100 mg total) by mouth 2 (two) times daily.   aspirin 81 MG tablet Take 1 tablet (81 mg total) by mouth 2 (two) times daily after a meal.   benzonatate (TESSALON) 200 MG capsule Take 1 capsule (200 mg total) by mouth 3 (three) times daily as needed for cough.   Cholecalciferol (VITAMIN D3 PO) Take 1 capsule by mouth daily.   colchicine 0.6 MG tablet TAKE 1 TABLET BY MOUTH TWICE A DAY WITH A MEAL AS NEEDED FOR ACUTE FLARE OF GOUT   Continuous Blood Gluc Receiver (FREESTYLE LIBRE 2 READER) DEVI USE AS INSTRUCTED TO CHECK BLOOD SUGARS.   Continuous Blood Gluc Sensor (FREESTYLE LIBRE 2 SENSOR) MISC 1 Device by Does not apply route every 14 (fourteen) days.   empagliflozin (JARDIANCE) 10 MG TABS tablet Take 1 tablet (10 mg  total) by mouth daily.   ezetimibe (ZETIA) 10 MG tablet Take 1 tablet (10 mg total) by mouth daily.   famotidine (PEPCID) 20 MG tablet TAKE 1 TABLET BY MOUTH EVERYDAY AT BEDTIME   furosemide (LASIX) 20 MG tablet Take 0.5 tablet (10 mg) by mouth  once EVERY OTHER day   Glucosamine HCl (GLUCOSAMINE PO) Take 1,500 mg by mouth 2 (two) times daily.    glucose blood (FREESTYLE LITE) test strip 1 each by Other route 4 (four) times daily. E11.65   HYDROcodone bit-homatropine (HYCODAN) 5-1.5 MG/5ML syrup Take 5 mLs by mouth every 6 (six) hours as needed for cough.   insulin isophane & regular human KwikPen (NOVOLIN 70/30 KWIKPEN) (70-30) 100 UNIT/ML KwikPen Inject 30 Units into the skin daily with breakfast. And pen needles 1/day   INVOKANA 100 MG TABS tablet TAKE 1 TABLET BY MOUTH EVERY OTHER DAY   isosorbide mononitrate (IMDUR) 30 MG 24 hr tablet Take 0.5 tablet (15 mg) by mouth once daily in the evening. You may take an extra 0.5 tablet (15 mg) by mouth once daily as needed for chest pain   Lancets (FREESTYLE) lancets 1 EACH BY OTHER ROUTE 4 (FOUR) TIMES DAILY. E11.65   Lutein 20 MG CAPS Take 20 mg by mouth daily.    methocarbamol (ROBAXIN) 500 MG tablet Take 1 tablet (500 mg total) by mouth every 8 (eight) hours as needed for muscle spasms. Caution of sedation   metoprolol tartrate (LOPRESSOR) 25 MG tablet Take 25 mg by mouth 2 (two) times daily.   Multiple Vitamins-Minerals (ZINC PO) Take 1 tablet by mouth daily.   nitroGLYCERIN (NITROSTAT) 0.4 MG SL tablet Place 1 tablet (0.4 mg total) under the tongue every 5 (five) minutes x 3 doses as needed for chest pain.   omeprazole (PRILOSEC) 40 MG capsule TAKE 1 CAPUSLE BY MOUTH 30- 60 MIN BEFORE YOUR FIRST AND LAST MEALS OF THE DAY   ondansetron (ZOFRAN) 8 MG tablet Take 1 tablet (8 mg total) by mouth every 8 (eight) hours as needed for nausea or vomiting. From narcotic pain medicine   polyethylene glycol (MIRALAX / GLYCOLAX) 17 g packet Take 17 g by mouth daily.   predniSONE (DELTASONE) 10 MG tablet Take prednisone 30 mg (3 tabs) for 3 days, then continue your maintenance dose of prednisone (Patient taking differently: 5 mg. Take prednisone 30 mg (3 tabs) for 3 days, then continue your maintenance  dose of prednisone)   rosuvastatin (CRESTOR) 40 MG tablet Take 1 tablet (40 mg total) by mouth daily.   Semaglutide, 2 MG/DOSE, (OZEMPIC, 2 MG/DOSE,) 8 MG/3ML SOPN Inject 2 mg into the skin once a week.                      Objective:   Physical Exam   02/14/2022         238  11/15/2021        236  10/04/2021        230  05/22/2021        230   01/15/2021       231  09/26/2020        220  11/24/2019        206  09/20/2018        222  08/26/2018      222 07/29/2017      232  04/15/2017          243  02/10/2017  250  12/19/2016        250 11/11/2016          258  09/11/2016          252  07/29/2016      244  05/16/2016          238  02/01/2016        235  01/07/2016          228   12/19/15 229 lb 4 oz (103.987 kg)  12/19/15 229 lb 4 oz (103.987 kg)  12/19/15 227 lb (102.967 kg)     Vital signs reviewed  02/14/2022  - Note at rest 02 sats  95% on RA   General appearance:    w/c bound slt hoarse  obese wm nad     HEENT : Oropharynx  clear     Nasal turbinates nl    NECK :  without  appent JVD/ palpable Nodes/TM    LUNGS: no acc muscle use,  Nl contour chest with trace crackles bases  bilaterally without cough on insp or exp maneuvers   CV:  RRR  no s3 or murmur or increase in P2, and no edema   ABD: obese  soft and nontender with nl inspiratory excursion in the supine position. No bruits or organomegaly appreciated   MS:  Nl gait/ ext warm without deformities Or obvious joint restrictions  calf tenderness, cyanosis or clubbing    SKIN: warm and dry without lesions    NEURO:  alert, approp, nl sensorium with  no motor or cerebellar deficits apparent.           Lab Results  Component Value Date   ESRSEDRATE 13 02/14/2022   ESRSEDRATE 16 08/12/2018   ESRSEDRATE 32 (H) 04/20/2017         Assessment & Plan:

## 2022-02-14 NOTE — Assessment & Plan Note (Addendum)
Onset of symptoms feb 2017 with "photo neg pumonary edema pattern" Baseline PFTs 03/18/10  FVC  3.59 (80%) with ERV 67% and dlco 75 corrects to 106 - Allergy profile 12/19/15 >  Eos 0.2 /  IgE  89/ Pos Cat RAST only  12/19/2015  ESR 93 > started on prednisone 20 mg daily  HSP profile 12/19/15 >  Neg  - 01/07/2016 ESR down to 18 but only 30% improved symptomatically > rec continue 20 mg daily until better then 10 mg daily  - 02/01/2016 rec taper off x 6 weeks then ov > 05/16/2016 ESR = 58> restarted pred 06/03/16  - Collagen Vasc dz screen 07/29/2016  Neg  - PFT's  11/11/2016  FEV1 1.91 (56 % ) ratio 72  p 7 % improvement from saba p nothing prior to study with DLCO  42/43 % corrects to 77 % for alv volume but ERV 35%  - ESR  20 on 10 a/w 15 as  Of 11/11/2016 > rec try 10 mg daily  - Prednisone 5 mg daily as of 12/15/16 > taper to 5 mg qod by 02/10/2017 - 02/10/2017 ESR =  28  rec taper off x 2 weeks > no flare - 04/20/2017 ESR = 32 off pred since 02/2017  - 08/12/18  ESR = 16 with onset of cough  -  Cough recurred similar to Boop late November 2021  >  08/30/2020   Prednisone 10 mg x 2 daily until better then one daily until seen > cxr 09/26/2020 on 10 mg daily = wnl so rec taper off over 10 days  and see if cough flares  - 01/15/2021 flared again with taper to 5 mg so new floor of 10/5 and ceiling of 20 mg - 01/15/2021 rec bone density per PCP > osteopenia only  The goal with a chronic steroid dependent illness is always arriving at the lowest effective dose that controls the disease/symptoms and not accepting a set "formula" which is based on statistics or guidelines that don't always take into account patient  variability or the natural hx of the dz in every individual patient, which may well vary over time.  For now therefore I recommend the patient maintain  20 mg ceiling and 5 mg floor   Esr nl with intermittent mild nausea which may suggest adrenal insuff> pt and wife advised on doubling dose if worsens    Continue regular sub max ex build to 30 min daily/ monitor sasts  F/u a 3 m           Each maintenance medication was reviewed in detail including emphasizing most importantly the difference between maintenance and prns and under what circumstances the prns are to be triggered using an action plan format where appropriate.  Total time for H and P, chart review, counseling,   and generating customized AVS unique to this office visit / same day charting = 25 min       

## 2022-02-14 NOTE — Telephone Encounter (Signed)
Received epic secure message from Dr. [name] to add on ESR.  Spoke to [facility] lab and added on lab.  Lab has been ordered.

## 2022-02-17 NOTE — Progress Notes (Signed)
Spoke with pt and notified of results per Dr. Wert. Pt verbalized understanding and denied any questions. 

## 2022-02-20 LAB — IGE: IgE (Immunoglobulin E), Serum: 7 IU/mL (ref 6–495)

## 2022-02-21 ENCOUNTER — Other Ambulatory Visit: Payer: Self-pay

## 2022-02-21 DIAGNOSIS — N1832 Chronic kidney disease, stage 3b: Secondary | ICD-10-CM

## 2022-02-21 MED ORDER — INSULIN PEN NEEDLE 31G X 8 MM MISC: 2 refills | Status: DC

## 2022-02-24 ENCOUNTER — Encounter: Payer: Self-pay | Admitting: Cardiovascular Disease

## 2022-02-25 ENCOUNTER — Ambulatory Visit: Payer: PPO | Admitting: Cardiovascular Disease

## 2022-02-25 ENCOUNTER — Encounter: Payer: Self-pay | Admitting: Cardiovascular Disease

## 2022-02-25 VITALS — BP 118/80 | HR 86 | Ht 70.0 in | Wt 236.2 lb

## 2022-02-25 DIAGNOSIS — I25118 Atherosclerotic heart disease of native coronary artery with other forms of angina pectoris: Secondary | ICD-10-CM

## 2022-02-25 DIAGNOSIS — I5032 Chronic diastolic (congestive) heart failure: Secondary | ICD-10-CM | POA: Diagnosis not present

## 2022-02-25 NOTE — Patient Instructions (Addendum)
Medication Instructions:  No changes  If you need a refill on your cardiac medications before your next appointment, please call your pharmacy.    Lab work: No new labs needed   Testing/Procedures: No new testing needed   Follow-Up: At [facility], you and your health needs are our priority.  As part of our continuing mission to provide you with exceptional heart care, we have created designated Provider Care Teams.  These Care Teams include your primary Cardiologist (physician) and Advanced Practice Providers (APPs -  Physician Assistants and Nurse Practitioners) who all work together to provide you with the care you need, when you need it.  You will need a follow up appointment in 6 months  Providers on your designated Care Team:   Christopher Berge, NP Ryan Dunn, PA-C Cadence Furth, PA-C  COVID-19 Vaccine Information can be found at: [email or web address] For questions related to vaccine distribution or appointments, please email [email or web address] or call [phone].   

## 2022-02-25 NOTE — Progress Notes (Signed)
Cardiology Office Note  Date:  02/25/2022   ID:  Kurt Aguilar, DOB 1946-02-09, MRN [id number]  PCP:  Kurt Greenspan, MD   Chief Complaint  Patient presents with   Dizziness    Patient c/o having no energy, bilateral LE edema, shortness of breath with exertion and has dizziness. Medications reviewed by the patient verbally.     HPI:  76 yo with history of  CAD,  CABG back in 1997 in [place], [place],  HTN,  Diabetes with chronic renal insufficiency hyperlipidemia,  gout He lost his son in early 2016 to cardiac arrest Aortic valve murmur/aortic valve disease h/o reactive airway symptoms, Boop,  presents for routine followup of his coronary artery disease, CRI   LOV 4/23 Prior cardiac catheterization January 2023, moderate disease and vein graft to the RCA Images pulled up and reviewed on today's visit  He is having difficulty exercising at the gym, shortness of breath, dizziness Initially did well on isosorbide/Imdur 30 daily Dose was decreased for syncope concerning for hypotension  Remains on Lasix daily, denies significant leg swelling Renal function elevated but stable  EKG personally reviewed by myself on todays visit Normal sinus rhythm rate 86 bpm right bundle branch block old inferior MI, PACs  Other past medical history reviewed COVID-19 in December (positive on 12/4), underlying history of BOOP  hospitalization January 2023, Non-STEMI Cardiac catheterization performed multivessel native coronary artery diseasesaphenous vein to right coronary artery with 70% ostial to proximal stenosis, SVG to OM 1 and 2 patent, SVG to first diagonal patent, LIMA to LAD patent.   The saphenous vein graft to the right coronary can be stented although it is not so critical that it should be causing enzyme elevation.  Enzyme elevation likely related to left coronary native disease in the septal perforator territory.  Echo 09/18/21 1. Left ventricular ejection fraction, by estimation,  is 60 to 65%. The  left ventricle has normal function. The left ventricle has no regional  wall motion abnormalities. Left ventricular diastolic parameters are  consistent with Grade I diastolic  dysfunction (impaired relaxation). Elevated left ventricular end-diastolic  pressure.   2. Right ventricular systolic function is moderately reduced. The right  ventricular size is normal.   3. The mitral valve is normal in structure. No evidence of mitral valve  regurgitation. No evidence of mitral stenosis.   4. The AV is poorly visualized to comment on structure. There does appear  to be come calcification of the valve. . The aortic valve was not well  visualized. Aortic valve regurgitation is not visualized. No aortic  stenosis is present.    h/o reactive airway symptoms, Boop PreviouslyStarted on prednisoneMarch 2017  for shortness of breath, improvement of symptoms,  cough returned after prednisone was discontinued getting close To his baseline Minimal wheezing and cough predisone 5 mg daily, Plan may be to wean the dose Managed by Dr. [name] , Pulmonary  Other major issue was hemoglobin A1c going up to 11 Started On insulin, daily in Am    PMH:   has a past medical history of Arthritis, Chronic kidney disease, Complication of anesthesia, Diabetes ([place]), Diverticulitis, GERD (gastroesophageal reflux disease), Gout, Heart attack ([name]), Heart disease, Heart murmur, Hyperglycemia, Hyperlipidemia, Hypertension, Osteoporosis, Pneumonia, and Sleep apnea.  PSH:    Past Surgical History:  Procedure Laterality Date   ACROMIO-CLAVICULAR JOINT REPAIR Left 05/05/2019   Procedure: SHOULDER ARTHROSCOPIC ASSISTED ACROMIO-CLAVICULAR JOINT REPAIR;  Surgeon: Tania Ade, MD;  Location: [facility];  Service: Orthopedics;  Laterality: Left;   CATARACT EXTRACTION Bilateral    CORONARY ARTERY BYPASS GRAFT     [place]   LEFT HEART CATH AND CORS/GRAFTS ANGIOGRAPHY N/A 09/19/2021   Procedure: LEFT  HEART CATH AND CORS/GRAFTS ANGIOGRAPHY;  Surgeon: Belva Crome, MD;  Location: [facility];  Service: Cardiovascular;  Laterality: N/A;   open heart surgery  09/09/1995-1998   5 bypass   REPAIR KNEE LIGAMENT     right   SHOULDER ARTHROSCOPY Left 05/05/2019   Procedure: ARTHROSCOPY SHOULDER;  Surgeon: Tania Ade, MD;  Location: [facility];  Service: Orthopedics;  Laterality: Left;   TONSILLECTOMY     TOTAL KNEE ARTHROPLASTY Left 05/29/2020   Procedure: LEFT TOTAL KNEE ARTHROPLASTY;  Surgeon: Melrose Nakayama, MD;  Location: [facility];  Service: Orthopedics;  Laterality: Left;    Current Outpatient Medications  Medication Sig Dispense Refill   acetaminophen (TYLENOL) 650 MG CR tablet Take 650 mg by mouth every 8 (eight) hours as needed for pain.     albuterol (VENTOLIN HFA) 108 (90 Base) MCG/ACT inhaler Inhale 2 puffs into the lungs every 6 (six) hours as needed for wheezing or shortness of breath. 8 g 2   allopurinol (ZYLOPRIM) 100 MG tablet Take 1 tablet (100 mg total) by mouth 2 (two) times daily. 180 tablet 3   aspirin EC (ASPIRIN 81) 81 MG tablet Take 81 mg by mouth daily. Swallow whole.     Cholecalciferol (VITAMIN D3 PO) Take 1 capsule by mouth daily.     colchicine 0.6 MG tablet TAKE 1 TABLET BY MOUTH TWICE A DAY WITH A MEAL AS NEEDED FOR ACUTE FLARE OF GOUT 45 tablet 3   empagliflozin (JARDIANCE) 10 MG TABS tablet Take 1 tablet (10 mg total) by mouth daily. 90 tablet 1   ezetimibe (ZETIA) 10 MG tablet Take 1 tablet (10 mg total) by mouth daily. 90 tablet 3   famotidine (PEPCID) 20 MG tablet TAKE 1 TABLET BY MOUTH EVERYDAY AT BEDTIME 90 tablet 1   furosemide (LASIX) 20 MG tablet Take 0.5 tablet (10 mg) by mouth once EVERY OTHER day 90 tablet 3   Glucosamine HCl (GLUCOSAMINE PO) Take 1,500 mg by mouth 2 (two) times daily.      HYDROcodone bit-homatropine (HYCODAN) 5-1.5 MG/5ML syrup Take 5 mLs by mouth every 6 (six) hours as needed for cough. 240 mL 0   insulin isophane & regular  human KwikPen (NOVOLIN 70/30 KWIKPEN) (70-30) 100 UNIT/ML KwikPen Inject 30 Units into the skin daily with breakfast. And pen needles 1/day 30 mL 1   Lutein 20 MG CAPS Take 20 mg by mouth daily.      methocarbamol (ROBAXIN) 500 MG tablet Take 1 tablet (500 mg total) by mouth every 8 (eight) hours as needed for muscle spasms. Caution of sedation 40 tablet 1   metoprolol tartrate (LOPRESSOR) 25 MG tablet Take 25 mg by mouth 2 (two) times daily.     Multiple Vitamins-Minerals (ZINC PO) Take 1 tablet by mouth daily.     nitroGLYCERIN (NITROSTAT) 0.4 MG SL tablet Place 1 tablet (0.4 mg total) under the tongue every 5 (five) minutes x 3 doses as needed for chest pain. 30 tablet 12   omeprazole (PRILOSEC) 40 MG capsule TAKE 1 CAPUSLE BY MOUTH 30- 60 MIN BEFORE YOUR FIRST AND LAST MEALS OF THE DAY 180 capsule 3   ondansetron (ZOFRAN) 8 MG tablet Take 1 tablet (8 mg total) by mouth every 8 (eight) hours as needed for nausea or vomiting.  From narcotic pain medicine 15 tablet 0   polyethylene glycol (MIRALAX / GLYCOLAX) 17 g packet Take 17 g by mouth daily.     predniSONE (DELTASONE) 10 MG tablet 2 until better then taper to one half 100 tablet 0   rosuvastatin (CRESTOR) 40 MG tablet Take 1 tablet (40 mg total) by mouth daily. 30 tablet 3   Semaglutide, 2 MG/DOSE, (OZEMPIC, 2 MG/DOSE,) 8 MG/3ML SOPN Inject 2 mg into the skin once a week. 9 mL 3   benzonatate (TESSALON) 200 MG capsule Take 1 capsule (200 mg total) by mouth 3 (three) times daily as needed for cough. (Patient not taking: Reported on 02/25/2022) 30 capsule 1   Continuous Blood Gluc Receiver (FREESTYLE LIBRE 2 READER) DEVI USE AS INSTRUCTED TO CHECK BLOOD SUGARS. (Patient not taking: Reported on 02/25/2022) 1 each 0   Continuous Blood Gluc Sensor (FREESTYLE LIBRE 2 SENSOR) MISC 1 Device by Does not apply route every 14 (fourteen) days. (Patient not taking: Reported on 02/25/2022) 6 each 3   glucose blood (FREESTYLE LITE) test strip 1 each by Other route  4 (four) times daily. E11.65 (Patient not taking: Reported on 02/25/2022) 400 strip 0   Insulin Pen Needle 31G X 8 MM MISC Use as instructed to inject insulin 1x daily (Patient not taking: Reported on 02/25/2022) 100 each 2   INVOKANA 100 MG TABS tablet TAKE 1 TABLET BY MOUTH EVERY OTHER DAY (Patient not taking: Reported on 02/25/2022) 30 tablet 5   Lancets (FREESTYLE) lancets 1 EACH BY OTHER ROUTE 4 (FOUR) TIMES DAILY. E11.65 (Patient not taking: Reported on 02/25/2022) 400 each 0   predniSONE (DELTASONE) 10 MG tablet Take prednisone 30 mg (3 tabs) for 3 days, then continue your maintenance dose of prednisone (Patient not taking: Reported on 02/25/2022) 30 tablet 0   No current facility-administered medications for this visit.     Allergies:   Oxycodone-acetaminophen and Oxycodone-acetaminophen   Social History:  The patient  reports that he quit smoking about 17 years ago. His smoking use included cigars and cigarettes. He has a 3.00 pack-year smoking history. He has never used smokeless tobacco. He reports current alcohol use of about 1.0 standard drink of alcohol per week. He reports that he does not use drugs.   Family History:   family history includes Heart attack (age of onset: 84) in his son; Heart disease in his father and mother; Stroke in his father.    Review of Systems: Review of Systems  Constitutional: Negative.   HENT: Negative.    Respiratory:  Positive for shortness of breath.   Cardiovascular:  Positive for chest pain.  Gastrointestinal: Negative.   Musculoskeletal: Negative.   Neurological: Negative.   Psychiatric/Behavioral: Negative.    All other systems reviewed and are negative.   PHYSICAL EXAM: VS:  BP 118/80 (BP Location: Left Arm, Patient Position: Sitting, Cuff Size: Normal)   Pulse 86   Ht '5\' 10"'$  (1.778 m)   Wt 236 lb 4 oz (107.2 kg)   SpO2 98%   BMI 33.90 kg/m  , BMI Body mass index is 33.9 kg/m.  Constitutional:  oriented to person, place, and time.  No distress.  HENT:  Head: Grossly normal Eyes:  no discharge. No scleral icterus.  Neck: No JVD, no carotid bruits  Cardiovascular: Regular rate and rhythm, no murmurs appreciated Pulmonary/Chest: Clear to auscultation bilaterally, no wheezes or rails Abdominal: Soft.  no distension.  no tenderness.  Musculoskeletal: Normal range of motion Neurological:  normal muscle  tone. Coordination normal. No atrophy Skin: Skin warm and dry Psychiatric: normal affect, pleasant   Recent Labs: 04/09/2021: TSH 3.86 09/17/2021: B Natriuretic Peptide 37.7 11/17/2021: ALT 51 12/18/2021: BUN 34; Creatinine, Ser 2.33; Potassium 4.4; Sodium 139 02/14/2022: Hemoglobin 14.3; Platelets 220    Lipid Panel Lab Results  Component Value Date   CHOL 135 02/14/2022   HDL 49 02/14/2022   LDLCALC 33 02/14/2022   TRIG 265 (H) 02/14/2022    Wt Readings from Last 3 Encounters:  02/25/22 236 lb 4 oz (107.2 kg)  02/14/22 238 lb 12.8 oz (108.3 kg)  01/09/22 237 lb 3.2 oz (107.6 kg)     ASSESSMENT AND PLAN:  Syncope No recent episodes, will continue to hold isosorbide Would hold Lasix for any orthostasis symptoms and take every other day  Coronary artery disease with stable angina, bypass Recent cardiac catheterization, Recent catheterization pulled up and reviewed, vein graft to RCA lesion does not appear to be critical.  Intervention would likely not provide significant clinical benefit  Pure hypercholesterolemia - Plan: EKG 12-Lead Cholesterol at goal  Essential hypertension - Plan: EKG 12-Lead Continue Lasix, metoprolol Imdur on hold given orthostasis/syncope  Chronic moderate shortness of breath,  History of COPD exacerbation/Boop Recommend he continue exercise for conditioning  Controlled type 2 diabetes mellitus with diabetic nephropathy, unspecified long term insulin use status ([place]) - Recent improvement in A1c  Morbid obesity due to excess calories ([place]) in setting of pred rx  -  We have  encouraged continued exercise, careful diet management in an effort to lose weight.    Total encounter time more than 40 minutes  Greater than 50% was spent in counseling and coordination of care with the patient      Orders Placed This Encounter  Procedures   EKG 12-Lead      Signed, Esmond Plants, M.D., Ph.D. 02/25/2022  [place], [place]

## 2022-04-03 ENCOUNTER — Telehealth: Payer: Self-pay | Admitting: Family Medicine

## 2022-04-03 MED ORDER — ROSUVASTATIN CALCIUM 40 MG PO TABS: 40.0000 mg | ORAL_TABLET | Freq: Every day | ORAL | 2 refills | Status: DC

## 2022-04-03 NOTE — Telephone Encounter (Signed)
Pt requesting refill of Crestor. [place]. Pharmacy told him they have requested and haven't heard back (?) Pt is out of medication.

## 2022-04-03 NOTE — Telephone Encounter (Signed)
I sent it  

## 2022-04-03 NOTE — Telephone Encounter (Signed)
Spoke with pt notifying him refill was sent in.  Pt expresses his thanks.

## 2022-04-03 NOTE — Telephone Encounter (Signed)
Is this okay to fill pt has been get this filled by Dr Tana [place].

## 2022-04-05 ENCOUNTER — Other Ambulatory Visit: Payer: Self-pay | Admitting: Internal Medicine

## 2022-04-05 DIAGNOSIS — R918 Other nonspecific abnormal finding of lung field: Secondary | ICD-10-CM

## 2022-04-07 ENCOUNTER — Encounter: Payer: Self-pay | Admitting: Internal Medicine

## 2022-04-08 ENCOUNTER — Telehealth: Payer: Self-pay | Admitting: Family Medicine

## 2022-04-08 DIAGNOSIS — E1169 Type 2 diabetes mellitus with other specified complication: Secondary | ICD-10-CM

## 2022-04-08 DIAGNOSIS — M1A00X0 Idiopathic chronic gout, unspecified site, without tophus (tophi): Secondary | ICD-10-CM

## 2022-04-08 DIAGNOSIS — Z125 Encounter for screening for malignant neoplasm of prostate: Secondary | ICD-10-CM

## 2022-04-08 DIAGNOSIS — I10 Essential (primary) hypertension: Secondary | ICD-10-CM

## 2022-04-08 NOTE — Telephone Encounter (Signed)
-----   Message from Ellamae Sia sent at 03/24/2022  2:44 PM EDT ----- Regarding: Lab orders for Wednesday 8.2.23 Patient is scheduled for CPX labs, please order future labs, Thanks , Karna Christmas

## 2022-04-09 ENCOUNTER — Other Ambulatory Visit (INDEPENDENT_AMBULATORY_CARE_PROVIDER_SITE_OTHER): Payer: PPO

## 2022-04-09 DIAGNOSIS — E1169 Type 2 diabetes mellitus with other specified complication: Secondary | ICD-10-CM | POA: Diagnosis not present

## 2022-04-09 DIAGNOSIS — E785 Hyperlipidemia, unspecified: Secondary | ICD-10-CM | POA: Diagnosis not present

## 2022-04-09 DIAGNOSIS — M1A00X0 Idiopathic chronic gout, unspecified site, without tophus (tophi): Secondary | ICD-10-CM

## 2022-04-09 DIAGNOSIS — I10 Essential (primary) hypertension: Secondary | ICD-10-CM | POA: Diagnosis not present

## 2022-04-09 DIAGNOSIS — Z125 Encounter for screening for malignant neoplasm of prostate: Secondary | ICD-10-CM

## 2022-04-09 LAB — LIPID PANEL
Cholesterol: 169 mg/dL (ref 0–200)
HDL: 51.8 mg/dL (ref >39.00)
NonHDL: 116.88
Total CHOL/HDL Ratio: 3
Triglycerides: 325 mg/dL — ABNORMAL HIGH (ref 0.0–149.0)
VLDL: 65 mg/dL — ABNORMAL HIGH (ref 0.0–40.0)

## 2022-04-09 LAB — CBC WITH DIFFERENTIAL/PLATELET
Basophils Absolute: 0.1 10*3/uL (ref 0.0–0.1)
Basophils Relative: 0.6 % (ref 0.0–3.0)
Eosinophils Absolute: 0.1 10*3/uL (ref 0.0–0.7)
Eosinophils Relative: 0.7 % (ref 0.0–5.0)
HCT: 42.9 % (ref 39.0–52.0)
Hemoglobin: 13.8 g/dL (ref 13.0–17.0)
Lymphocytes Relative: 31.1 % (ref 12.0–46.0)
Lymphs Abs: 3.8 10*3/uL (ref 0.7–4.0)
MCHC: 32.1 g/dL (ref 30.0–36.0)
MCV: 97 fl (ref 78.0–100.0)
Monocytes Absolute: 0.9 10*3/uL (ref 0.1–1.0)
Monocytes Relative: 7.1 % (ref 3.0–12.0)
Neutro Abs: 7.3 10*3/uL (ref 1.4–7.7)
Neutrophils Relative %: 60.5 % (ref 43.0–77.0)
Platelets: 250 10*3/uL (ref 150.0–400.0)
RBC: 4.42 Mil/uL (ref 4.22–5.81)
RDW: 16.6 % — ABNORMAL HIGH (ref 11.5–15.5)
WBC: 12.1 10*3/uL — ABNORMAL HIGH (ref 4.0–10.5)

## 2022-04-09 LAB — COMPREHENSIVE METABOLIC PANEL
ALT: 35 U/L (ref 0–53)
AST: 24 U/L (ref 0–37)
Albumin: 4.5 g/dL (ref 3.5–5.2)
Alkaline Phosphatase: 51 U/L (ref 39–117)
BUN: 41 mg/dL — ABNORMAL HIGH (ref 6–23)
CO2: 23 mEq/L (ref 19–32)
Calcium: 9.6 mg/dL (ref 8.4–10.5)
Chloride: 102 mEq/L (ref 96–112)
Creatinine, Ser: 2.33 mg/dL — ABNORMAL HIGH (ref 0.40–1.50)
GFR: 26.12 mL/min — ABNORMAL LOW (ref >60.00)
Glucose, Bld: 131 mg/dL — ABNORMAL HIGH (ref 70–99)
Potassium: 4 mEq/L (ref 3.5–5.1)
Sodium: 141 mEq/L (ref 135–145)
Total Bilirubin: 1 mg/dL (ref 0.2–1.2)
Total Protein: 6.8 g/dL (ref 6.0–8.3)

## 2022-04-09 LAB — LDL CHOLESTEROL, DIRECT: Direct LDL: 78 mg/dL

## 2022-04-09 LAB — TSH: TSH: 2.25 u[IU]/mL (ref 0.35–5.50)

## 2022-04-09 LAB — URIC ACID: Uric Acid, Serum: 6 mg/dL (ref 4.0–7.8)

## 2022-04-09 LAB — PSA, MEDICARE: PSA: 1.25 ng/ml (ref 0.10–4.00)

## 2022-04-13 ENCOUNTER — Other Ambulatory Visit: Payer: Self-pay | Admitting: Family Medicine

## 2022-04-14 ENCOUNTER — Other Ambulatory Visit: Payer: Self-pay | Admitting: Family Medicine

## 2022-04-16 ENCOUNTER — Encounter: Payer: Self-pay | Admitting: Family Medicine

## 2022-04-16 ENCOUNTER — Ambulatory Visit (INDEPENDENT_AMBULATORY_CARE_PROVIDER_SITE_OTHER): Payer: PPO | Admitting: Family Medicine

## 2022-04-16 VITALS — Ht 70.47 in | Wt 242.0 lb

## 2022-04-16 DIAGNOSIS — Z1211 Encounter for screening for malignant neoplasm of colon: Secondary | ICD-10-CM

## 2022-04-16 DIAGNOSIS — E1122 Type 2 diabetes mellitus with diabetic chronic kidney disease: Secondary | ICD-10-CM | POA: Diagnosis not present

## 2022-04-16 DIAGNOSIS — M1A00X0 Idiopathic chronic gout, unspecified site, without tophus (tophi): Secondary | ICD-10-CM

## 2022-04-16 DIAGNOSIS — I2510 Atherosclerotic heart disease of native coronary artery without angina pectoris: Secondary | ICD-10-CM

## 2022-04-16 DIAGNOSIS — E785 Hyperlipidemia, unspecified: Secondary | ICD-10-CM

## 2022-04-16 DIAGNOSIS — Z0000 Encounter for general adult medical examination without abnormal findings: Secondary | ICD-10-CM | POA: Diagnosis not present

## 2022-04-16 DIAGNOSIS — E1169 Type 2 diabetes mellitus with other specified complication: Secondary | ICD-10-CM

## 2022-04-16 DIAGNOSIS — F1920 Other psychoactive substance dependence, uncomplicated: Secondary | ICD-10-CM

## 2022-04-16 DIAGNOSIS — N1832 Chronic kidney disease, stage 3b: Secondary | ICD-10-CM

## 2022-04-16 DIAGNOSIS — M85859 Other specified disorders of bone density and structure, unspecified thigh: Secondary | ICD-10-CM | POA: Diagnosis not present

## 2022-04-16 DIAGNOSIS — N401 Enlarged prostate with lower urinary tract symptoms: Secondary | ICD-10-CM | POA: Diagnosis not present

## 2022-04-16 DIAGNOSIS — Z125 Encounter for screening for malignant neoplasm of prostate: Secondary | ICD-10-CM | POA: Diagnosis not present

## 2022-04-16 DIAGNOSIS — R918 Other nonspecific abnormal finding of lung field: Secondary | ICD-10-CM

## 2022-04-16 DIAGNOSIS — N138 Other obstructive and reflux uropathy: Secondary | ICD-10-CM

## 2022-04-16 DIAGNOSIS — I10 Essential (primary) hypertension: Secondary | ICD-10-CM

## 2022-04-16 DIAGNOSIS — K219 Gastro-esophageal reflux disease without esophagitis: Secondary | ICD-10-CM

## 2022-04-16 LAB — MICROALBUMIN / CREATININE URINE RATIO
Creatinine,U: 72.1 mg/dL
Microalb Creat Ratio: 4.4 mg/g (ref 0.0–30.0)
Microalb, Ur: 3.2 mg/dL — ABNORMAL HIGH (ref 0.0–1.9)

## 2022-04-16 NOTE — Assessment & Plan Note (Signed)
Lab Results  Component Value Date   PSA 1.25 04/09/2022   PSA 1.18 04/09/2021   PSA 2.32 04/09/2020    No clinical changes  Has BPH

## 2022-04-16 NOTE — Assessment & Plan Note (Signed)
Under care of endocrinology  Enc to schedule eye exam microalb done

## 2022-04-16 NOTE — Assessment & Plan Note (Signed)
Improved with predisone 10 mg daily  Hopes to try and wean today  Under care of pulm

## 2022-04-16 NOTE — Assessment & Plan Note (Signed)
bp in fair control at this time  BP Readings from Last 1 Encounters:  02/25/22 118/80   Trending low with some orthostatic changes today  Taking metoprolol 25 mg bid for CAD with CHF Unsure if he could safely cut back more  Most recent labs reviewed  Disc lifstyle change with low sodium diet and exercise  Lasix 10 mg daily -no problems

## 2022-04-16 NOTE — Assessment & Plan Note (Signed)
Diabetic and with cystic kidneys  Cr is 2.33 For nephrology f/u next week

## 2022-04-16 NOTE — Assessment & Plan Note (Signed)
Due for 5 y recall/polyps but in light of current health status will hold off   Is over 73 as well

## 2022-04-16 NOTE — Assessment & Plan Note (Signed)
High dose omeprazole bid to control symptoms and pepcid  Watching diet

## 2022-04-16 NOTE — Assessment & Plan Note (Signed)
No flares Allopurinol 100 mg bid

## 2022-04-16 NOTE — Patient Instructions (Addendum)
Get back on vitamin D3 at least 2000 iu daily  Take with a meal  Keep in contact with cardiology regarding dizziness and low blood pressure  Change position very slowly!  If you are dizzy sit down or lie down Stay hydrated    I will send this note along   See kidney doctor as planned Keep fluids up

## 2022-04-16 NOTE — Progress Notes (Signed)
Subjective:    Patient ID: Kurt Aguilar, male    DOB: 1945-12-16, 76 y.o.   MRN: [id number]  HPI Here for health maintenance exam and to review chronic medical problems   Wt Readings from Last 3 Encounters:  04/16/22 242 lb (109.8 kg)  02/25/22 236 lb 4 oz (107.2 kg)  02/14/22 238 lb 12.8 oz (108.3 kg)   Pulse Readings from Last 3 Encounters:  02/25/22 86  02/14/22 90  01/09/22 91   Not feeling great  Back on more prednisone for BOOP - dislikes it but it works (pulmonary)   Immunization History  Administered Date(s) Administered   Fluad Quad(high Dose 65+) 06/25/2019   Influenza Split 07/09/2011   Influenza Whole 05/14/2010, 06/09/2012   Influenza, High Dose Seasonal PF 05/22/2014, 07/04/2015, 05/16/2016, 07/09/2018   Influenza,inj,Quad PF,6+ Mos 07/11/2013   Influenza-Unspecified 07/03/2017, 06/14/2021   Moderna Sars-Covid-2 Vaccination 11/18/2019, 12/27/2019, 07/24/2020, 04/12/2021, 06/14/2021   Pneumococcal Conjugate-13 08/07/2014   Pneumococcal Polysaccharide-23 09/08/2002, 07/11/2013   Td 09/08/2004, 02/12/2016   Zoster Recombinat (Shingrix) 04/06/2018, 06/17/2018   Zoster, Live 07/09/2011   Health Maintenance Due  Topic Date Due   URINE MICROALBUMIN  03/31/2020   COVID-19 Vaccine (6 - Booster for Moderna series) 08/09/2021   FOOT EXAM  08/15/2021   OPHTHALMOLOGY EXAM  11/28/2021   INFLUENZA VACCINE  04/08/2022     Prostate health H/o BPH  Lab Results  Component Value Date   PSA 1.25 04/09/2022   PSA 1.18 04/09/2021   PSA 2.32 04/09/2020  No clinical changes   Colon cancer screening - colonoscopy 04/2017 / had polyps 5 year recall is due now  Due to health- will hold off on that    Bone density -h/o steroid use  Dexa  05/2021-mild osteopenia  Falls: has had several from syncope - cardiology is aware  Fractures: none  Supplements : was taking vitamin D  Exercise : limited due to breathing and dizziness Occ rides exercise bike for 20 minutes at  a low level    HTN BP Readings from Last 3 Encounters:  02/25/22 118/80  02/14/22 132/74  01/09/22 110/76   Pulse Readings from Last 3 Encounters:  02/25/22 86  02/14/22 90  01/09/22 91   Gets dizzy when he stands Cardiology is aware Some drop with standing   Some orthostatic bp change on exam today   Lasix 10 mg daily  Metoprolol 25 mg bid   Lab Results  Component Value Date   CREATININE 2.33 (H) 04/09/2022   BUN 41 (H) 04/09/2022   NA 141 04/09/2022   K 4.0 04/09/2022   CL 102 04/09/2022   CO2 23 04/09/2022    CKD due to DM2 Sees nephrology on Monday  Trying to drink good amt of fluids   H/o CAD  Recent cath with RCA lesion    H/o BOOP- higher dose prednisone   May be able to drop from 10 to 5 eventually    GERD Omeprazole 40 bid   Controls things well  Also pepcid at bedtime     DM  Under endocrinology care  Last eye exam -needs to schedule  Did microalb today  Last a1c was ? 6.7   Hyperlipidemia Lab Results  Component Value Date   CHOL 169 04/09/2022   CHOL 135 02/14/2022   CHOL 154 04/09/2021   Lab Results  Component Value Date   HDL 51.80 04/09/2022   HDL 49 02/14/2022   HDL 44.30 04/09/2021   Lab Results  Component Value Date   LDLCALC 33 02/14/2022   LDLCALC 45 03/24/2019   LDLCALC 72 02/06/2016   Lab Results  Component Value Date   TRIG 325.0 (H) 04/09/2022   TRIG 265 (H) 02/14/2022   TRIG 242.0 (H) 04/09/2021   Lab Results  Component Value Date   CHOLHDL 3 04/09/2022   CHOLHDL 2.8 02/14/2022   CHOLHDL 3 04/09/2021   Lab Results  Component Value Date   LDLDIRECT 78.0 04/09/2022   LDLDIRECT 74.0 04/09/2021   LDLDIRECT 59.0 04/09/2020   Crestor 40 mg daily  Zetial 10 mg daily    LDL at goal  Lab Results  Component Value Date   ALT 35 04/09/2022   AST 24 04/09/2022   ALKPHOS 51 04/09/2022   BILITOT 1.0 04/09/2022     Lab Results  Component Value Date   WBC 12.1 (H) 04/09/2022   HGB 13.8 04/09/2022    HCT 42.9 04/09/2022   MCV 97.0 04/09/2022   PLT 250.0 04/09/2022   Lab Results  Component Value Date   TSH 2.25 04/09/2022   . Patient Active Problem List   Diagnosis Date Noted   Chronic diastolic heart failure ([place]) 10/23/2021   AKI (acute kidney injury) ([place]) 09/17/2021   Stage 3b chronic kidney disease (CKD) ([place]) 09/17/2021   Acute coronary syndrome ([place]) 09/17/2021   Osteopenia 05/16/2021   Steroid dependent ([place]) 04/12/2021   Primary osteoarthritis of left knee 05/29/2020   Colon cancer screening 02/13/2017   DM2 (diabetes mellitus, type 2) ([place]) 08/13/2016   Chronic heel pain, left 08/12/2016   Upper airway cough syndrome 07/31/2016   Class 1 obesity with serious comorbidity and body mass index (BMI) of 32.0 to 32.9 in adult 05/19/2016   Routine general medical examination at a health care facility 01/27/2016   Pulmonary infiltrates with high  ESR c/w BOOP/ idiopathic  12/19/2015   Cough 11/29/2015   Cystic kidney disease 10/01/2015   Renal lesion 09/27/2015   Erectile dysfunction of organic origin 09/27/2015   BPH with obstruction/lower urinary tract symptoms 09/27/2015   Constipation 09/24/2015   Cyst of right kidney 09/24/2015   CAD (coronary artery disease) 06/08/2015   Grieving 11/22/2014   Encounter for Medicare annual wellness exam 07/11/2013   Prostate cancer screening 07/03/2013   Right bundle branch block 02/22/2013   Chronic low back pain 05/13/2011   Obstructive sleep apnea 03/15/2010   Hyperlipidemia associated with type 2 diabetes mellitus ([place]) 04/12/2009   Gout 04/12/2009   Essential hypertension 04/12/2009   Coronary artery disease of bypass graft of native heart with stable angina pectoris ([place]) 04/12/2009   GERD 04/12/2009   Renal insufficiency 04/12/2009   DIVERTICULITIS, HX OF 04/12/2009   Past Medical History:  Diagnosis Date   Arthritis    Chronic kidney disease    Complication of anesthesia    constipation and inability to   urinate happened a few days after cataract surgery    Diabetes ([place])    type 2    Diverticulitis    GERD (gastroesophageal reflux disease)    Gout    Heart attack ([place])    Heart disease    Heart murmur    Hyperglycemia    Hyperlipidemia    Hypertension    Osteoporosis    Pneumonia    hx of BOOP followed by Dr [name]    Sleep apnea    cpapp - setting at 15    Past Surgical History:  Procedure Laterality Date   ACROMIO-CLAVICULAR JOINT  REPAIR Left 05/05/2019   Procedure: SHOULDER ARTHROSCOPIC ASSISTED ACROMIO-CLAVICULAR JOINT REPAIR;  Surgeon: Tania Ade, MD;  Location: [facility];  Service: Orthopedics;  Laterality: Left;   CATARACT EXTRACTION Bilateral    CORONARY ARTERY BYPASS GRAFT     [place]   LEFT HEART CATH AND CORS/GRAFTS ANGIOGRAPHY N/A 09/19/2021   Procedure: LEFT HEART CATH AND CORS/GRAFTS ANGIOGRAPHY;  Surgeon: Belva Crome, MD;  Location: [facility];  Service: Cardiovascular;  Laterality: N/A;   open heart surgery  09/09/1995-1998   5 bypass   REPAIR KNEE LIGAMENT     right   SHOULDER ARTHROSCOPY Left 05/05/2019   Procedure: ARTHROSCOPY SHOULDER;  Surgeon: Tania Ade, MD;  Location: [facility];  Service: Orthopedics;  Laterality: Left;   TONSILLECTOMY     TOTAL KNEE ARTHROPLASTY Left 05/29/2020   Procedure: LEFT TOTAL KNEE ARTHROPLASTY;  Surgeon: Melrose Nakayama, MD;  Location: [facility];  Service: Orthopedics;  Laterality: Left;   Social History   Tobacco Use   Smoking status: Former    Packs/day: 1.00    Years: 3.00    Total pack years: 3.00    Types: Cigars, Cigarettes    Quit date: 11/25/2004    Years since quitting: 17.4   Smokeless tobacco: Never   Tobacco comments:    occas. cigar quit 2006  Vaping Use   Vaping Use: Never used  Substance Use Topics   Alcohol use: Yes    Alcohol/week: 1.0 standard drink of alcohol    Types: 1 Standard drinks or equivalent per week    Comment: rare   Drug use: No   Family History  Problem  Relation Age of Onset   Heart disease Mother    Heart disease Father    Stroke Father    Heart attack Son 4   Kidney disease Neg Hx    Prostate cancer Neg Hx    Diabetes Neg Hx    Allergies  Allergen Reactions   Oxycodone-Acetaminophen Other (See Comments)    Stops breathing; has tolerated Tylenol before   Oxycodone-Acetaminophen Anaphylaxis   Current Outpatient Medications on File Prior to Visit  Medication Sig Dispense Refill   allopurinol (ZYLOPRIM) 100 MG tablet TAKE 1 TABLET BY MOUTH TWICE A DAY 180 tablet 3   aspirin EC (ASPIRIN 81) 81 MG tablet Take 81 mg by mouth daily. Swallow whole.     colchicine 0.6 MG tablet TAKE 1 TABLET BY MOUTH TWICE A DAY WITH A MEAL AS NEEDED FOR ACUTE FLARE OF GOUT 45 tablet 3   Continuous Blood Gluc Receiver (FREESTYLE LIBRE 2 READER) DEVI USE AS INSTRUCTED TO CHECK BLOOD SUGARS. 1 each 0   Continuous Blood Gluc Sensor (FREESTYLE LIBRE 2 SENSOR) MISC 1 Device by Does not apply route every 14 (fourteen) days. 6 each 3   empagliflozin (JARDIANCE) 10 MG TABS tablet Take 1 tablet (10 mg total) by mouth daily. 90 tablet 1   ezetimibe (ZETIA) 10 MG tablet TAKE 1 TABLET BY MOUTH EVERY DAY 90 tablet 3   famotidine (PEPCID) 20 MG tablet TAKE 1 TABLET BY MOUTH EVERYDAY AT BEDTIME 90 tablet 1   furosemide (LASIX) 20 MG tablet Take 0.5 tablet (10 mg) by mouth once EVERY OTHER day 90 tablet 3   Glucosamine HCl (GLUCOSAMINE PO) Take 1,500 mg by mouth 2 (two) times daily.      glucose blood (FREESTYLE LITE) test strip 1 each by Other route 4 (four) times daily. E11.65 400 strip 0   insulin isophane &  regular human KwikPen (NOVOLIN 70/30 KWIKPEN) (70-30) 100 UNIT/ML KwikPen Inject 30 Units into the skin daily with breakfast. And pen needles 1/day 30 mL 1   Insulin Pen Needle 31G X 8 MM MISC Use as instructed to inject insulin 1x daily 100 each 2   Lancets (FREESTYLE) lancets 1 EACH BY OTHER ROUTE 4 (FOUR) TIMES DAILY. E11.65 400 each 0   Lutein 20 MG CAPS Take 20  mg by mouth daily.      metoprolol tartrate (LOPRESSOR) 25 MG tablet Take 25 mg by mouth 2 (two) times daily.     omeprazole (PRILOSEC) 40 MG capsule TAKE 1 CAPUSLE BY MOUTH 30- 60 MIN BEFORE YOUR FIRST AND LAST MEALS OF THE DAY 180 capsule 3   polyethylene glycol (MIRALAX / GLYCOLAX) 17 g packet Take 17 g by mouth daily.     predniSONE (DELTASONE) 10 MG tablet Take prednisone 30 mg (3 tabs) for 3 days, then continue your maintenance dose of prednisone 30 tablet 0   predniSONE (DELTASONE) 10 MG tablet TAKE 2 TABLETS BY MOUTH UNTIL BETTER THEN TAPER TO 1/2 TABLET 100 tablet 0   rosuvastatin (CRESTOR) 40 MG tablet Take 1 tablet (40 mg total) by mouth daily. 90 tablet 2   albuterol (VENTOLIN HFA) 108 (90 Base) MCG/ACT inhaler Inhale 2 puffs into the lungs every 6 (six) hours as needed for wheezing or shortness of breath. (Patient not taking: Reported on 04/16/2022) 8 g 2   benzonatate (TESSALON) 200 MG capsule Take 1 capsule (200 mg total) by mouth 3 (three) times daily as needed for cough. (Patient not taking: Reported on 02/25/2022) 30 capsule 1   Cholecalciferol (VITAMIN D3 PO) Take 1 capsule by mouth daily. (Patient not taking: Reported on 04/16/2022)     HYDROcodone bit-homatropine (HYCODAN) 5-1.5 MG/5ML syrup Take 5 mLs by mouth every 6 (six) hours as needed for cough. (Patient not taking: Reported on 04/16/2022) 240 mL 0   INVOKANA 100 MG TABS tablet TAKE 1 TABLET BY MOUTH EVERY OTHER DAY (Patient not taking: Reported on 02/25/2022) 30 tablet 5   methocarbamol (ROBAXIN) 500 MG tablet Take 1 tablet (500 mg total) by mouth every 8 (eight) hours as needed for muscle spasms. Caution of sedation (Patient not taking: Reported on 04/16/2022) 40 tablet 1   nitroGLYCERIN (NITROSTAT) 0.4 MG SL tablet Place 1 tablet (0.4 mg total) under the tongue every 5 (five) minutes x 3 doses as needed for chest pain. (Patient not taking: Reported on 04/16/2022) 30 tablet 12   ondansetron (ZOFRAN) 8 MG tablet Take 1 tablet (8 mg  total) by mouth every 8 (eight) hours as needed for nausea or vomiting. From narcotic pain medicine (Patient not taking: Reported on 04/16/2022) 15 tablet 0   No current facility-administered medications on file prior to visit.     Review of Systems  Constitutional:  Positive for fatigue. Negative for activity change, appetite change, fever and unexpected weight change.  HENT:  Negative for congestion, rhinorrhea, sore throat and trouble swallowing.   Eyes:  Negative for pain, redness, itching and visual disturbance.  Respiratory:  Positive for shortness of breath. Negative for cough, chest tightness and wheezing.   Cardiovascular:  Negative for chest pain and palpitations.  Gastrointestinal:  Negative for abdominal pain, blood in stool, constipation, diarrhea and nausea.  Endocrine: Negative for cold intolerance, heat intolerance, polydipsia and polyuria.  Genitourinary:  Negative for difficulty urinating, dysuria, frequency and urgency.  Musculoskeletal:  Positive for arthralgias. Negative for joint swelling and myalgias.  Skin:  Negative for pallor and rash.  Neurological:  Negative for dizziness, tremors, weakness, numbness and headaches.  Hematological:  Negative for adenopathy. Does not bruise/bleed easily.  Psychiatric/Behavioral:  Negative for decreased concentration and dysphoric mood. The patient is not nervous/anxious.        Objective:   Physical Exam Constitutional:      General: He is not in acute distress.    Appearance: Normal appearance. He is well-developed. He is obese. He is not ill-appearing or diaphoretic.  HENT:     Head: Normocephalic and atraumatic.     Right Ear: Tympanic membrane, ear canal and external ear normal.     Left Ear: Tympanic membrane, ear canal and external ear normal.     Nose: Nose normal. No congestion.     Mouth/Throat:     Mouth: Mucous membranes are moist.     Pharynx: Oropharynx is clear. No posterior oropharyngeal erythema.  Eyes:      General: No scleral icterus.       Right eye: No discharge.        Left eye: No discharge.     Conjunctiva/sclera: Conjunctivae normal.     Pupils: Pupils are equal, round, and reactive to light.  Neck:     Thyroid: No thyromegaly.     Vascular: No carotid bruit or JVD.  Cardiovascular:     Rate and Rhythm: Normal rate and regular rhythm.     Pulses: Normal pulses.     Heart sounds: Normal heart sounds.     No gallop.  Pulmonary:     Effort: Pulmonary effort is normal. No respiratory distress.     Breath sounds: Normal breath sounds. No wheezing or rales.     Comments: Good air exch Chest:     Chest wall: No tenderness.  Abdominal:     General: Bowel sounds are normal. There is no distension or abdominal bruit.     Palpations: Abdomen is soft. There is no mass.     Tenderness: There is no abdominal tenderness.     Hernia: No hernia is present.  Musculoskeletal:        General: No tenderness.     Cervical back: Normal range of motion and neck supple. No rigidity. No muscular tenderness.     Right lower leg: No edema.     Left lower leg: No edema.  Lymphadenopathy:     Cervical: No cervical adenopathy.  Skin:    General: Skin is warm and dry.     Coloration: Skin is not pale.     Findings: No erythema or rash.     Comments: Fair  Solar lentigines diffusely   Neurological:     Mental Status: He is alert.     Cranial Nerves: No cranial nerve deficit.     Motor: No abnormal muscle tone.     Coordination: Coordination normal.     Gait: Gait normal.     Deep Tendon Reflexes: Reflexes are normal and symmetric. Reflexes normal.  Psychiatric:        Mood and Affect: Mood normal.        Cognition and Memory: Cognition and memory normal.     Comments: Cognitively sharp           Assessment & Plan:   Problem List Items Addressed This Visit       Cardiovascular and Mediastinum   Essential hypertension (Chronic)    bp in fair control at this time  BP Readings from  Last 1 Encounters:  02/25/22 118/80  Trending low with some orthostatic changes today  Taking metoprolol 25 mg bid for CAD with CHF Unsure if he could safely cut back more  Most recent labs reviewed  Disc lifstyle change with low sodium diet and exercise  Lasix 10 mg daily -no problems        CAD (coronary artery disease)    More hypotension with orthostatic symptoms recently  Unsure if he could safely dial back more on metoprolol  Plans to reach out to cardiology         Respiratory   Pulmonary infiltrates with high  ESR c/w BOOP/ idiopathic     Improved with predisone 10 mg daily  Hopes to try and wean today  Under care of pulm        Digestive   GERD    High dose omeprazole bid to control symptoms and pepcid  Watching diet         Endocrine   DM2 (diabetes mellitus, type 2) ([place])    Under care of endocrinology  Enc to schedule eye exam microalb done       Relevant Orders   Microalbumin/Creatinine Ratio, Urine   Hyperlipidemia associated with type 2 diabetes mellitus ([place])    Disc goals for lipids and reasons to control them Rev last labs with pt Rev low sat fat diet in detail LDL is well controlled at 33 crestor 40 and zetia 10  Trig up due to sugar possibly  Under care of cardiology         Musculoskeletal and Integument   Osteopenia    Mild osteopeina 05/2021 dexa Due for next in a year  Falls but no fx Disc fall prevention  Also wt bearing exercise as tolerated Enc go get back on vit D 2000 iu daily         Genitourinary   Stage 3b chronic kidney disease (CKD) ([facility]) (Chronic)    Diabetic and with cystic kidneys  Cr is 2.33 For nephrology f/u next week      BPH with obstruction/lower urinary tract symptoms    No clinical changes Lab Results  Component Value Date   PSA 1.25 04/09/2022   PSA 1.18 04/09/2021   PSA 2.32 04/09/2020            Other   Steroid dependent ([place]) (Chronic)    dexa utd 05/2021 Repeat in a year Recommended  re start vit D for bone health        Colon cancer screening    Due for 5 y recall/polyps but in light of current health status will hold off   Is over 75 as well      Gout    No flares Allopurinol 100 mg bid       Prostate cancer screening    Lab Results  Component Value Date   PSA 1.25 04/09/2022   PSA 1.18 04/09/2021   PSA 2.32 04/09/2020   No clinical changes  Has BPH      Routine general medical examination at a health care facility - Primary    Reviewed health habits including diet and exercise and skin cancer prevention Reviewed appropriate screening tests for age  Also reviewed health mt list, fam hx and immunization status , as well as social and family history   See HPI Labs reviewed  dexa utd /no fractures/will get back on vit D psa stable  Recommend flu shot in the fall

## 2022-04-16 NOTE — Assessment & Plan Note (Signed)
Disc goals for lipids and reasons to control them Rev last labs with pt Rev low sat fat diet in detail LDL is well controlled at 33 crestor 40 and zetia 10  Trig up due to sugar possibly  Under care of cardiology

## 2022-04-16 NOTE — Assessment & Plan Note (Signed)
Reviewed health habits including diet and exercise and skin cancer prevention Reviewed appropriate screening tests for age  Also reviewed health mt list, fam hx and immunization status , as well as social and family history   See HPI Labs reviewed  dexa utd /no fractures/will get back on vit D psa stable  Recommend flu shot in the fall

## 2022-04-16 NOTE — Assessment & Plan Note (Signed)
Mild osteopeina 05/2021 dexa Due for next in a year  Falls but no fx Disc fall prevention  Also wt bearing exercise as tolerated Enc go get back on vit D 2000 iu daily

## 2022-04-16 NOTE — Assessment & Plan Note (Signed)
dexa utd 05/2021 Repeat in a year Recommended re start vit D for bone health

## 2022-04-16 NOTE — Assessment & Plan Note (Signed)
More hypotension with orthostatic symptoms recently  Unsure if he could safely dial back more on metoprolol  Plans to reach out to cardiology

## 2022-04-16 NOTE — Assessment & Plan Note (Signed)
No clinical changes Lab Results  Component Value Date   PSA 1.25 04/09/2022   PSA 1.18 04/09/2021   PSA 2.32 04/09/2020

## 2022-04-17 ENCOUNTER — Ambulatory Visit (INDEPENDENT_AMBULATORY_CARE_PROVIDER_SITE_OTHER): Payer: PPO

## 2022-04-17 VITALS — Wt 242.0 lb

## 2022-04-17 DIAGNOSIS — Z0000 Encounter for general adult medical examination without abnormal findings: Secondary | ICD-10-CM | POA: Diagnosis not present
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
## 2022-04-17 NOTE — Patient Instructions (Signed)
Kurt Aguilar , Thank you for taking time to come for your Medicare Wellness Visit. I appreciate your ongoing commitment to your health goals. Please review the following plan we discussed and let me know if I can assist you in the future.   Screening recommendations/referrals: Colonoscopy: aged out Recommended yearly ophthalmology/optometry visit for glaucoma screening and checkup Recommended yearly dental visit for hygiene and checkup  Vaccinations: Influenza vaccine: 06/14/21 Pneumococcal vaccine: 08/07/14 Tdap vaccine: 02/12/16 Shingles vaccine: Shingrix 04/06/18, 06/17/18   Zostavax 07/09/11 Covid-19: 11/18/19, 12/27/19, 07/24/20, 04/12/21, 06/14/21  Advanced directives: yes  Conditions/risks identified: no  Next appointment: Follow up in one year for your annual wellness visit. 04/20/23 @ 3:45 pm by phone  Preventive Care 65 Years and Older, Male Preventive care refers to lifestyle choices and visits with your health care provider that can promote health and wellness. What does preventive care include? A yearly physical exam. This is also called an annual well check. Dental exams once or twice a year. Routine eye exams. Ask your health care provider how often you should have your eyes checked. Personal lifestyle choices, including: Daily care of your teeth and gums. Regular physical activity. Eating a healthy diet. Avoiding tobacco and drug use. Limiting alcohol use. Practicing safe sex. Taking low doses of aspirin every day. Taking vitamin and mineral supplements as recommended by your health care provider. What happens during an annual well check? The services and screenings done by your health care provider during your annual well check will depend on your age, overall health, lifestyle risk factors, and family history of disease. Counseling  Your health care provider may ask you questions about your: Alcohol use. Tobacco use. Drug use. Emotional well-being. Home and  relationship well-being. Sexual activity. Eating habits. History of falls. Memory and ability to understand (cognition). Work and work Statistician. Screening  You may have the following tests or measurements: Height, weight, and BMI. Blood pressure. Lipid and cholesterol levels. These may be checked every 5 years, or more frequently if you are over 62 years old. Skin check. Lung cancer screening. You may have this screening every year starting at age 49 if you have a 30-pack-year history of smoking and currently smoke or have quit within the past 15 years. Fecal occult blood test (FOBT) of the stool. You may have this test every year starting at age 46. Flexible sigmoidoscopy or colonoscopy. You may have a sigmoidoscopy every 5 years or a colonoscopy every 10 years starting at age 52. Prostate cancer screening. Recommendations will vary depending on your family history and other risks. Hepatitis C blood test. Hepatitis B blood test. Sexually transmitted disease (STD) testing. Diabetes screening. This is done by checking your blood sugar (glucose) after you have not eaten for a while (fasting). You may have this done every 1-3 years. Abdominal aortic aneurysm (AAA) screening. You may need this if you are a current or former smoker. Osteoporosis. You may be screened starting at age 72 if you are at high risk. Talk with your health care provider about your test results, treatment options, and if necessary, the need for more tests. Vaccines  Your health care provider may recommend certain vaccines, such as: Influenza vaccine. This is recommended every year. Tetanus, diphtheria, and acellular pertussis (Tdap, Td) vaccine. You may need a Td booster every 10 years. Zoster vaccine. You may need this after age 76. Pneumococcal 13-valent conjugate (PCV13) vaccine. One dose is recommended after age 72. Pneumococcal polysaccharide (PPSV23) vaccine. One dose is recommended after  age 2. Talk to your  health care provider about which screenings and vaccines you need and how often you need them. This information is not intended to replace advice given to you by your health care provider. Make sure you discuss any questions you have with your health care provider. Document Released: 09/21/2015 Document Revised: 05/14/2016 Document Reviewed: 06/26/2015 Elsevier Interactive Patient Education  2017 [place] Prevention in the Home Falls can cause injuries. They can happen to people of all ages. There are many things you can do to make your home safe and to help prevent falls. What can I do on the outside of my home? Regularly fix the edges of walkways and driveways and fix any cracks. Remove anything that might make you trip as you walk through a door, such as a raised step or threshold. Trim any bushes or trees on the path to your home. Use bright outdoor lighting. Clear any walking paths of anything that might make someone trip, such as rocks or tools. Regularly check to see if handrails are loose or broken. Make sure that both sides of any steps have handrails. Any raised decks and porches should have guardrails on the edges. Have any leaves, snow, or ice cleared regularly. Use sand or salt on walking paths during winter. Clean up any spills in your garage right away. This includes oil or grease spills. What can I do in the bathroom? Use night lights. Install grab bars by the toilet and in the tub and shower. Do not use towel bars as grab bars. Use non-skid mats or decals in the tub or shower. If you need to sit down in the shower, use a plastic, non-slip stool. Keep the floor dry. Clean up any water that spills on the floor as soon as it happens. Remove soap buildup in the tub or shower regularly. Attach bath mats securely with double-sided non-slip rug tape. Do not have throw rugs and other things on the floor that can make you trip. What can I do in the bedroom? Use night  lights. Make sure that you have a light by your bed that is easy to reach. Do not use any sheets or blankets that are too big for your bed. They should not hang down onto the floor. Have a firm chair that has side arms. You can use this for support while you get dressed. Do not have throw rugs and other things on the floor that can make you trip. What can I do in the kitchen? Clean up any spills right away. Avoid walking on wet floors. Keep items that you use a lot in easy-to-reach places. If you need to reach something above you, use a strong step stool that has a grab bar. Keep electrical cords out of the way. Do not use floor polish or wax that makes floors slippery. If you must use wax, use non-skid floor wax. Do not have throw rugs and other things on the floor that can make you trip. What can I do with my stairs? Do not leave any items on the stairs. Make sure that there are handrails on both sides of the stairs and use them. Fix handrails that are broken or loose. Make sure that handrails are as long as the stairways. Check any carpeting to make sure that it is firmly attached to the stairs. Fix any carpet that is loose or worn. Avoid having throw rugs at the top or bottom of the stairs. If you do  have throw rugs, attach them to the floor with carpet tape. Make sure that you have a light switch at the top of the stairs and the bottom of the stairs. If you do not have them, ask someone to add them for you. What else can I do to help prevent falls? Wear shoes that: Do not have high heels. Have rubber bottoms. Are comfortable and fit you well. Are closed at the toe. Do not wear sandals. If you use a stepladder: Make sure that it is fully opened. Do not climb a closed stepladder. Make sure that both sides of the stepladder are locked into place. Ask someone to hold it for you, if possible. Clearly mark and make sure that you can see: Any grab bars or handrails. First and last  steps. Where the edge of each step is. Use tools that help you move around (mobility aids) if they are needed. These include: Canes. Walkers. Scooters. Crutches. Turn on the lights when you go into a dark area. Replace any light bulbs as soon as they burn out. Set up your furniture so you have a clear path. Avoid moving your furniture around. If any of your floors are uneven, fix them. If there are any pets around you, be aware of where they are. Review your medicines with your doctor. Some medicines can make you feel dizzy. This can increase your chance of falling. Ask your doctor what other things that you can do to help prevent falls. This information is not intended to replace advice given to you by your health care provider. Make sure you discuss any questions you have with your health care provider. Document Released: 06/21/2009 Document Revised: 01/31/2016 Document Reviewed: 09/29/2014 Elsevier Interactive Patient Education  2017 [employer].

## 2022-04-18 MED ORDER — METOPROLOL TARTRATE 25 MG PO TABS: 12.5000 mg | ORAL_TABLET | Freq: Two times a day (BID) | ORAL | 3 refills | Status: DC

## 2022-04-18 NOTE — Telephone Encounter (Signed)
-----   Message from Minna Merritts, MD sent at 04/17/2022  3:29 PM EDT ----- If still having near syncope and syncope We could decrease the lasix 10 mg to every other day Metoprolol down to 12.5 twice a day  thx TGollan  ----- Message ----- From: [name], MD Sent: 04/16/2022   1:53 PM EDT To: Minna Merritts, MD  Kurt Aguilar is having more issues with orthostatic dizziness and has had a few falls. Unsure if there is any wiggle room with the beta blocker (probably not)  I told him I would reach out to you and counseled him on changing positions slowly. He has a number or challenging health issues currently. Thanks for taking care of him!

## 2022-04-21 DIAGNOSIS — I10 Essential (primary) hypertension: Secondary | ICD-10-CM | POA: Diagnosis not present

## 2022-04-21 DIAGNOSIS — Q612 Polycystic kidney, adult type: Secondary | ICD-10-CM | POA: Diagnosis not present

## 2022-04-21 DIAGNOSIS — E1129 Type 2 diabetes mellitus with other diabetic kidney complication: Secondary | ICD-10-CM | POA: Diagnosis not present

## 2022-04-21 DIAGNOSIS — N184 Chronic kidney disease, stage 4 (severe): Secondary | ICD-10-CM | POA: Diagnosis not present
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
## 2022-04-29 ENCOUNTER — Other Ambulatory Visit: Payer: Self-pay | Admitting: Nephrology

## 2022-04-29 ENCOUNTER — Other Ambulatory Visit (HOSPITAL_COMMUNITY): Payer: Self-pay | Admitting: Nephrology

## 2022-04-29 DIAGNOSIS — N184 Chronic kidney disease, stage 4 (severe): Secondary | ICD-10-CM

## 2022-04-29 DIAGNOSIS — Q612 Polycystic kidney, adult type: Secondary | ICD-10-CM

## 2022-05-06 ENCOUNTER — Ambulatory Visit: Payer: PPO | Admitting: Internal Medicine

## 2022-05-06 ENCOUNTER — Encounter: Payer: Self-pay | Admitting: Internal Medicine

## 2022-05-06 VITALS — BP 120/88 | HR 90 | Ht 70.47 in | Wt 239.0 lb

## 2022-05-06 DIAGNOSIS — E1165 Type 2 diabetes mellitus with hyperglycemia: Secondary | ICD-10-CM

## 2022-05-06 DIAGNOSIS — E785 Hyperlipidemia, unspecified: Secondary | ICD-10-CM | POA: Diagnosis not present

## 2022-05-06 DIAGNOSIS — E1169 Type 2 diabetes mellitus with other specified complication: Secondary | ICD-10-CM | POA: Diagnosis not present

## 2022-05-06 DIAGNOSIS — E1159 Type 2 diabetes mellitus with other circulatory complications: Secondary | ICD-10-CM

## 2022-05-06 LAB — POCT GLYCOSYLATED HEMOGLOBIN (HGB A1C): Hemoglobin A1C: 6.6 % — AB (ref 4.0–5.6)

## 2022-05-06 NOTE — Progress Notes (Signed)
Patient ID: Kurt Aguilar, male   DOB: 12-06-1945, 76 y.o.   MRN: [id number]  HPI: Kurt Aguilar is a 76 y.o.-year-old male, returning for follow-up for DM2, dx in 2014, insulin-dependent in 2018-2019 and again since 2023, uncontrolled, with complications (CAD - s/p AMIs, diastolic CHF, CKD stage III, peripheral neuropathy). Pt. previously saw Dr. Loanne Drilling, last visit 3.5 months ago.  He is here with his wife who offers part of the history, especially regarding his diabetic medications, past medical history, and diet.  Reviewed HbA1c: Lab Results  Component Value Date   HGBA1C 6.7 (A) 01/09/2022   HGBA1C 8.0 (A) 11/14/2021   HGBA1C 9.5 (A) 09/13/2021   HGBA1C 6.5 (A) 08/15/2020   HGBA1C 6.6 (H) 05/17/2020   HGBA1C 6.3 (A) 02/10/2020   HGBA1C 5.8 (A) 08/15/2019   HGBA1C 6.3 03/24/2019   HGBA1C 7.2 (A) 02/10/2019   HGBA1C 6.2 (A) 08/11/2018   Pt is on a regimen of: - NPH/regular insulin 70/30 35 units in am (30 min before b'fast) - Jardiance 10 mg after breakfast  - Ozempic 2 mg weekly - was off for 2 weeks 2/2 availability at the pharmacy  Pt checks his sugars >4x a day and they are:  Lowest sugar was 66; he has hypoglycemia awareness at 70.  Highest sugar was 254.  Glucometer: Freestyle lite  He is breakfast is usually eggs and bacon.  - +CKD-sees nephrology-Dr. Candiss Norse, last BUN/creatinine:  Lab Results  Component Value Date   BUN 41 (H) 04/09/2022   BUN 34 (H) 12/18/2021   CREATININE 2.33 (H) 04/09/2022   CREATININE 2.33 (H) 12/18/2021  On lisinopril 2.5 mg daily.  -+ HL; last set of lipids: Lab Results  Component Value Date   CHOL 169 04/09/2022   HDL 51.80 04/09/2022   LDLCALC 33 02/14/2022   LDLDIRECT 78.0 04/09/2022   TRIG 325.0 (H) 04/09/2022   CHOLHDL 3 04/09/2022  On Crestor 40 mg daily and Zetia 10 milligrams daily.  - last eye exam was in 10/02/2021. No DR.   - no numbness and tingling in his feet.  Last foot exam 04/16/2022.  He has a h/o BOOP 2/2  Covid19 (on frequent steroid courses), HTN, OSA-on CPAP, osteoporosis, BPH, GERD, gout, history of diverticulitis.  ROS: + see HPI He mentions increased urination, blurry vision.  Also, he has dizziness an orthostasis - passed out.  Past Medical History:  Diagnosis Date   Arthritis    Chronic kidney disease    Complication of anesthesia    constipation and inability to  urinate happened a few days after cataract surgery    Diabetes ([place])    type 2    Diverticulitis    GERD (gastroesophageal reflux disease)    Gout    Heart attack ([place])    Heart disease    Heart murmur    Hyperglycemia    Hyperlipidemia    Hypertension    Osteoporosis    Pneumonia    hx of BOOP followed by Dr [name]    Sleep apnea    cpapp - setting at 15    Past Surgical History:  Procedure Laterality Date   ACROMIO-CLAVICULAR JOINT REPAIR Left 05/05/2019   Procedure: SHOULDER ARTHROSCOPIC ASSISTED ACROMIO-CLAVICULAR JOINT REPAIR;  Surgeon: Tania Ade, MD;  Location: [facility];  Service: Orthopedics;  Laterality: Left;   CATARACT EXTRACTION Bilateral    CORONARY ARTERY BYPASS GRAFT     [place]   LEFT HEART CATH AND CORS/GRAFTS ANGIOGRAPHY N/A 09/19/2021  Procedure: LEFT HEART CATH AND CORS/GRAFTS ANGIOGRAPHY;  Surgeon: Belva Crome, MD;  Location: [facility];  Service: Cardiovascular;  Laterality: N/A;   open heart surgery  09/09/1995-1998   5 bypass   REPAIR KNEE LIGAMENT     right   SHOULDER ARTHROSCOPY Left 05/05/2019   Procedure: ARTHROSCOPY SHOULDER;  Surgeon: Tania Ade, MD;  Location: [facility];  Service: Orthopedics;  Laterality: Left;   TONSILLECTOMY     TOTAL KNEE ARTHROPLASTY Left 05/29/2020   Procedure: LEFT TOTAL KNEE ARTHROPLASTY;  Surgeon: Melrose Nakayama, MD;  Location: [facility];  Service: Orthopedics;  Laterality: Left;   Social History   Socioeconomic History   Marital status: Married    Spouse name: Not on file   Number of children: 4   Years of education: 14    Highest education level: Not on file  Occupational History   Occupation: Regulatory affairs officer    Employer: RETIRED  Tobacco Use   Smoking status: Former    Packs/day: 1.00    Years: 3.00    Total pack years: 3.00    Types: Cigars, Cigarettes    Quit date: 11/25/2004    Years since quitting: 17.4   Smokeless tobacco: Never   Tobacco comments:    occas. cigar quit 2006  Vaping Use   Vaping Use: Never used  Substance and Sexual Activity   Alcohol use: Yes    Alcohol/week: 1.0 standard drink of alcohol    Types: 1 Standard drinks or equivalent per week    Comment: rare   Drug use: No   Sexual activity: Never  Other Topics Concern   Not on file  Social History Narrative   Not on file   Social Determinants of Health   Financial Resource Strain: Low Risk  (04/17/2022)   Overall Financial Resource Strain (CARDIA)    Difficulty of Paying Living Expenses: Not hard at all  Food Insecurity: No Food Insecurity (04/17/2022)   Hunger Vital Sign    Worried About Running Out of Food in the Last Year: Never true    Ran Out of Food in the Last Year: Never true  Transportation Needs: No Transportation Needs (04/17/2022)   PRAPARE - Hydrologist (Medical): No    Lack of Transportation (Non-Medical): No  Physical Activity: Insufficiently Active (04/17/2022)   Exercise Vital Sign    Days of Exercise per Week: 5 days    Minutes of Exercise per Session: 20 min  Stress: No Stress Concern Present (04/17/2022)   [place]    Feeling of Stress : Only a little  Social Connections: Moderately Integrated (04/17/2022)   Social Connection and Isolation Panel [NHANES]    Frequency of Communication with Friends and Family: More than three times a week    Frequency of Social Gatherings with Friends and Family: Once a week    Attends Religious Services: More than 4 times per year    Active Member of [employer] or  Organizations: No    Attends Archivist Meetings: Never    Marital Status: Married  Human resources officer Violence: Not At Risk (04/17/2022)   Humiliation, Afraid, Rape, and Kick questionnaire    Fear of Current or Ex-Partner: No    Emotionally Abused: No    Physically Abused: No    Sexually Abused: No   Current Outpatient Medications on File Prior to Visit  Medication Sig Dispense Refill   albuterol (VENTOLIN HFA) 108 (  90 Base) MCG/ACT inhaler Inhale 2 puffs into the lungs every 6 (six) hours as needed for wheezing or shortness of breath. (Patient not taking: Reported on 04/16/2022) 8 g 2   allopurinol (ZYLOPRIM) 100 MG tablet TAKE 1 TABLET BY MOUTH TWICE A DAY 180 tablet 3   Aspirin 325 MG CAPS aspirin (Patient not taking: Reported on 04/17/2022)     aspirin EC (ASPIRIN 81) 81 MG tablet Take 81 mg by mouth daily. Swallow whole.     azithromycin (ZITHROMAX) 250 MG tablet TAKE 2 TABLETS BY MOUTH TODAY, THEN TAKE 1 TABLET DAILY FOR 4 DAYS (Patient not taking: Reported on 04/17/2022)     benzonatate (TESSALON) 200 MG capsule Take 1 capsule (200 mg total) by mouth 3 (three) times daily as needed for cough. 30 capsule 1   canagliflozin (INVOKANA) 100 MG TABS tablet Take 1 tablet by mouth every other day. (Patient not taking: Reported on 04/17/2022)     Cholecalciferol (VITAMIN D3 PO) Take 1 capsule by mouth daily. (Patient not taking: Reported on 04/17/2022)     colchicine 0.6 MG tablet TAKE 1 TABLET BY MOUTH TWICE A DAY WITH A MEAL AS NEEDED FOR ACUTE FLARE OF GOUT 45 tablet 3   Continuous Blood Gluc Receiver (FREESTYLE LIBRE 2 READER) DEVI USE AS INSTRUCTED TO CHECK BLOOD SUGARS. 1 each 0   Continuous Blood Gluc Sensor (FREESTYLE LIBRE 2 SENSOR) MISC 1 Device by Does not apply route every 14 (fourteen) days. 6 each 3   empagliflozin (JARDIANCE) 10 MG TABS tablet Take 1 tablet (10 mg total) by mouth daily. 90 tablet 1   empagliflozin (JARDIANCE) 10 MG TABS tablet Take 1 tablet by mouth daily.      ezetimibe (ZETIA) 10 MG tablet TAKE 1 TABLET BY MOUTH EVERY DAY 90 tablet 3   famotidine (PEPCID) 20 MG tablet TAKE 1 TABLET BY MOUTH EVERYDAY AT BEDTIME 90 tablet 1   furosemide (LASIX) 20 MG tablet Take 0.5 tablet (10 mg) by mouth once EVERY OTHER day 90 tablet 3   Glucosamine HCl (GLUCOSAMINE PO) Take 1,500 mg by mouth 2 (two) times daily.      glucose blood (FREESTYLE LITE) test strip 1 each by Other route 4 (four) times daily. E11.65 400 strip 0   HYDROcodone bit-homatropine (HYCODAN) 5-1.5 MG/5ML syrup Take 5 mLs by mouth every 6 (six) hours as needed for cough. (Patient not taking: Reported on 04/16/2022) 240 mL 0   insulin isophane & regular human KwikPen (NOVOLIN 70/30 KWIKPEN) (70-30) 100 UNIT/ML KwikPen Inject 30 Units into the skin daily with breakfast. And pen needles 1/day 30 mL 1   Insulin Pen Needle 31G X 8 MM MISC Use as instructed to inject insulin 1x daily 100 each 2   INVOKANA 100 MG TABS tablet TAKE 1 TABLET BY MOUTH EVERY OTHER DAY (Patient not taking: Reported on 02/25/2022) 30 tablet 5   isosorbide mononitrate (IMDUR) 30 MG 24 hr tablet Take 1 tablet by mouth daily.     Lancets (FREESTYLE) lancets 1 EACH BY OTHER ROUTE 4 (FOUR) TIMES DAILY. E11.65 400 each 0   lisinopril (ZESTRIL) 2.5 MG tablet TAKE 1 TABLET BY MOUTH 1 TIME EACH DAY.     Lutein 20 MG CAPS Take 20 mg by mouth daily.      methocarbamol (ROBAXIN) 500 MG tablet Take 1 tablet (500 mg total) by mouth every 8 (eight) hours as needed for muscle spasms. Caution of sedation (Patient not taking: Reported on 04/16/2022) 40 tablet 1  metoprolol tartrate (LOPRESSOR) 25 MG tablet Take 0.5 tablets (12.5 mg total) by mouth 2 (two) times daily. 90 tablet 3   molnupiravir EUA (LAGEVRIO) 200 MG CAPS capsule TAKE 4 CAPSULES (800 MG TOTAL) BY MOUTH TWICE A DAY FOR 5 DAYS (Patient not taking: Reported on 04/17/2022)     nitroGLYCERIN (NITROSTAT) 0.4 MG SL tablet Place 1 tablet (0.4 mg total) under the tongue every 5 (five) minutes x 3  doses as needed for chest pain. (Patient not taking: Reported on 04/16/2022) 30 tablet 12   omeprazole (PRILOSEC) 40 MG capsule TAKE 1 CAPUSLE BY MOUTH 30- 60 MIN BEFORE YOUR FIRST AND LAST MEALS OF THE DAY 180 capsule 3   ondansetron (ZOFRAN) 8 MG tablet Take 1 tablet (8 mg total) by mouth every 8 (eight) hours as needed for nausea or vomiting. From narcotic pain medicine 15 tablet 0   polyethylene glycol (MIRALAX / GLYCOLAX) 17 g packet Take 17 g by mouth daily.     predniSONE (DELTASONE) 10 MG tablet Take prednisone 30 mg (3 tabs) for 3 days, then continue your maintenance dose of prednisone 30 tablet 0   rosuvastatin (CRESTOR) 40 MG tablet Take 1 tablet (40 mg total) by mouth daily. 90 tablet 2   Semaglutide, 1 MG/DOSE, (OZEMPIC, 1 MG/DOSE,) 4 MG/3ML SOPN INJECT 1 MG ONCE A WEEK AS DIRECTED (Patient not taking: Reported on 04/17/2022)     Semaglutide,0.25 or 0.'5MG'$ /DOS, (OZEMPIC, 0.25 OR 0.5 MG/DOSE,) 2 MG/3ML SOPN Inject 0.5 mg into the skin once a week. (Patient not taking: Reported on 04/17/2022)     tobramycin-dexamethasone (TOBRADEX) ophthalmic solution INSTILL 1 DROP INTO BOTH EYES TWICE A DAY FOR 10 DAYS, THEN ONCE DAILY THEREAFTER (Patient not taking: Reported on 04/17/2022)     No current facility-administered medications on file prior to visit.   Allergies  Allergen Reactions   Oxycodone-Acetaminophen Other (See Comments)    Stops breathing; has tolerated Tylenol before   Oxycodone-Acetaminophen Anaphylaxis   Family History  Problem Relation Age of Onset   Heart disease Mother    Heart disease Father    Stroke Father    Heart attack Son 78   Kidney disease Neg Hx    Prostate cancer Neg Hx    Diabetes Neg Hx     PE: BP 120/88 (BP Location: Left Arm, Patient Position: Sitting, Cuff Size: Normal)   Pulse 90   Ht 5' 10.47" (1.79 m)   Wt 239 lb (108.4 kg)   SpO2 95%   BMI 33.84 kg/m  Wt Readings from Last 3 Encounters:  05/06/22 239 lb (108.4 kg)  04/17/22 242 lb (109.8 kg)   04/16/22 242 lb (109.8 kg)   Constitutional: overweight, in NAD Eyes: no exophthalmos ENT: moist mucous membranes, no thyromegaly, no cervical lymphadenopathy Cardiovascular: RRR, No MRG Respiratory: CTA B Musculoskeletal: no deformities Skin: moist, warm, no rashes Neurological: no tremor with outstretched hands  ASSESSMENT: 1. DM2, insulin-dependent, uncontrolled, without long-term complications - CAD - diastolic CHF - CKD stage III - peripheral neuropathy  2.  Hyperlipidemia  PLAN:  1. Patient with long-standing, uncontrolled diabetes, on oral antidiabetic regimen with SGLT2 inhibitor, weekly GLP-1 receptor agonist, and also premixed insulin regimen, with improving control.  Latest HbA1c level from last visit with Dr. Loanne Drilling was lower, at 6.7%.Today: HbA1c is 6.6% (lower). CGM interpretation: -At today's visit, we reviewed his CGM downloads: It appears that 92% of values are in target range (goal >70%), while 8% are higher than 180 (goal <25%), and 0%  are lower than 70 (goal <4%).  The calculated average blood sugar is 131.  The projected HbA1c for the next 3 months (GMI) is 6.4%. -Reviewing the CGM trends, sugars are excellent overnight and they are occasionally higher after breakfast and less significant so after lunch and dinner.  In fact, very seldom his blood sugars are high after lunch and dinner.  We asked about improving his breakfast.  He is currently eating a high-fat breakfast every morning.  Discussed about switching to oatmeal, for example. -We also discussed about moving Jardiance before breakfast, since he is not taking it afterwards.  This may help with the occasional higher blood sugars after this meal. -He is taking his premixed insulin only for breakfast, but based on the pattern of his blood sugars, he does not need to take this twice a day.  In fact, he tells me that he occasionally wakes up at night and blood sugar appears to be in the 60s.  He does not usually  check with his glucometer and I advised him to try to do so, since blood sugars checked by the CGM can be slightly lower than those checked by glucometer.  If the sugar remains low, he may need to correct it and we discussed that ideally this is corrected with 15 g of carbs.  He currently corrected with a handful of M&Ms, per his wife's report.  I advised him that he may not need this many carbs. -He tolerates Ozempic well.  We will continue the same dose.  He did have problems taking it from the pharmacy but hopefully this problem is corrected now. - I suggested to:  Patient Instructions  Please continue: - NPH/regular insulin 70/30 35 units in am - Ozempic 2 mg weekly - Jardiance 10 mg but move it before breakfast  Please return in 4 months with your sugar log.   - check sugars at different times of the day - check 4x a day, rotating checks - discussed about CBG targets for treatment: 80-130 mg/dL before meals and <180 mg/dL after meals; target HbA1c <7%. - given foot care handout  - given instructions for hypoglycemia management "15-15 rule"  - advised for yearly eye exams  - Return to clinic in 4 mo   2.  Hyperlipidemia - Reviewed latest lipid panel from earlier this month: LDL higher, now above goal of less than 55 due to vascular disease, also high triglycerides: Lab Results  Component Value Date   CHOL 169 04/09/2022   HDL 51.80 04/09/2022   LDLCALC 33 02/14/2022   LDLDIRECT 78.0 04/09/2022   TRIG 325.0 (H) 04/09/2022   CHOLHDL 3 04/09/2022  - Continues Crestor 40 mg daily and Zetia 10 mg daily without side effects.   Philemon Kingdom, MD PhD [facility] Endocrinology

## 2022-05-06 NOTE — Patient Instructions (Addendum)
Please continue: - NPH/regular insulin 70/30 35 units in am - Ozempic 2 mg weekly - Jardiance 10 mg but move it before breakfast  Please return in 4 months with your sugar log.   PATIENT INSTRUCTIONS FOR TYPE 2 DIABETES:  **Please join MyChart!** - see attached instructions about how to join if you have not done so already.  DIET AND EXERCISE Diet and exercise is an important part of diabetic treatment.  We recommended aerobic exercise in the form of brisk walking (working between 40-60% of maximal aerobic capacity, similar to brisk walking) for 150 minutes per week (such as 30 minutes five days per week) along with 3 times per week performing 'resistance' training (using various gauge rubber tubes with handles) 5-10 exercises involving the major muscle groups (upper body, lower body and core) performing 10-15 repetitions (or near fatigue) each exercise. Start at half the above goal but build slowly to reach the above goals. If limited by weight, joint pain, or disability, we recommend daily walking in a swimming pool with water up to waist to reduce pressure from joints while allow for adequate exercise.    BLOOD GLUCOSES Monitoring your blood glucoses is important for continued management of your diabetes. Please check your blood glucoses 2-4 times a day: fasting, before meals and at bedtime (you can rotate these measurements - e.g. one day check before the 3 meals, the next day check before 2 of the meals and before bedtime, etc.).   HYPOGLYCEMIA (low blood sugar) Hypoglycemia is usually a reaction to not eating, exercising, or taking too much insulin/ other diabetes drugs.  Symptoms include tremors, sweating, hunger, confusion, headache, etc. Treat IMMEDIATELY with 15 grams of Carbs: 4 glucose tablets  cup regular juice/soda 2 tablespoons raisins 4 teaspoons sugar 1 tablespoon honey Recheck blood glucose in 15 mins and repeat above if still symptomatic/blood glucose  <100.  RECOMMENDATIONS TO REDUCE YOUR RISK OF DIABETIC COMPLICATIONS: * Take your prescribed MEDICATION(S) * Follow a DIABETIC diet: Complex carbs, fiber rich foods, (monounsaturated and polyunsaturated) fats * AVOID saturated/trans fats, high fat foods, >2,300 mg salt per day. * EXERCISE at least 5 times a week for 30 minutes or preferably daily.  * DO NOT SMOKE OR DRINK more than 1 drink a day. * Check your FEET every day. Do not wear tightfitting shoes. Contact us if you develop an ulcer * See your EYE doctor once a year or more if needed * Get a FLU shot once a year * Get a PNEUMONIA vaccine once before and once after age 30 years  GOALS:  * Your Hemoglobin A1c of <7%  * fasting sugars need to be <130 * after meals sugars need to be <180 (2h after you start eating) * Your Systolic BP should be 350 or lower  * Your Diastolic BP should be 80 or lower  * Your HDL (Good Cholesterol) should be 40 or higher  * Your LDL (Bad Cholesterol) should be 100 or lower. * Your Triglycerides should be 150 or lower  * Your Urine microalbumin (kidney function) should be <30 * Your Body Mass Index should be 25 or lower    Please consider the following ways to cut down carbs and fat and increase fiber and micronutrients in your diet: - substitute whole grain for white bread or pasta - substitute brown rice for white rice - substitute 90-calorie flat bread pieces for slices of bread when possible - substitute sweet potatoes or yams for white potatoes - substitute humus  for margarine - substitute tofu for cheese when possible - substitute almond or rice milk for regular milk (would not drink soy milk daily due to concern for soy estrogen influence on breast cancer risk) - substitute dark chocolate for other sweets when possible - substitute water - can add lemon or orange slices for taste - for diet sodas (artificial sweeteners will trick your body that you can eat sweets without getting calories and  will lead you to overeating and weight gain in the long run) - do not skip breakfast or other meals (this will slow down the metabolism and will result in more weight gain over time)  - can try smoothies made from fruit and almond/rice milk in am instead of regular breakfast - can also try old-fashioned (not instant) oatmeal made with almond/rice milk in am - order the dressing on the side when eating salad at a restaurant (pour less than half of the dressing on the salad) - eat as little meat as possible - can try juicing, but should not forget that juicing will get rid of the fiber, so would alternate with eating raw veg./fruits or drinking smoothies - use as little oil as possible, even when using olive oil - can dress a salad with a mix of balsamic vinegar and lemon juice, for e.g. - use agave nectar, stevia sugar, or regular sugar rather than artificial sweateners - steam or broil/roast veggies  - snack on veggies/fruit/nuts (unsalted, preferably) when possible, rather than processed foods - reduce or eliminate aspartame in diet (it is in diet sodas, chewing gum, etc) Read the labels!  Try to read Dr. Janene Harvey book: "Program for Reversing Diabetes" for other ideas for healthy eating.

## 2022-05-07 ENCOUNTER — Ambulatory Visit
Admission: RE | Admit: 2022-05-07 | Discharge: 2022-05-07 | Disposition: A | Discharge status: Home / Self Care | Payer: PPO | Source: Ambulatory Visit | Attending: Nephrology | Admitting: Nephrology

## 2022-05-07 DIAGNOSIS — Q612 Polycystic kidney, adult type: Secondary | ICD-10-CM | POA: Insufficient documentation

## 2022-05-07 DIAGNOSIS — N184 Chronic kidney disease, stage 4 (severe): Secondary | ICD-10-CM | POA: Insufficient documentation

## 2022-05-07 DIAGNOSIS — R1900 Intra-abdominal and pelvic swelling, mass and lump, unspecified site: Secondary | ICD-10-CM | POA: Diagnosis not present

## 2022-05-07 DIAGNOSIS — K7689 Other specified diseases of liver: Secondary | ICD-10-CM | POA: Diagnosis not present

## 2022-05-07 DIAGNOSIS — N281 Cyst of kidney, acquired: Secondary | ICD-10-CM | POA: Diagnosis not present

## 2022-05-16 ENCOUNTER — Ambulatory Visit: Payer: PPO | Admitting: Cardiovascular Disease

## 2022-05-29 ENCOUNTER — Ambulatory Visit: Payer: PPO | Admitting: Internal Medicine

## 2022-05-29 ENCOUNTER — Encounter: Payer: Self-pay | Admitting: Internal Medicine

## 2022-05-29 VITALS — BP 130/80 | HR 82 | Temp 97.6°F | Ht 70.25 in | Wt 239.6 lb

## 2022-05-29 DIAGNOSIS — R058 Other specified cough: Secondary | ICD-10-CM

## 2022-05-29 DIAGNOSIS — R918 Other nonspecific abnormal finding of lung field: Secondary | ICD-10-CM | POA: Diagnosis not present

## 2022-05-29 DIAGNOSIS — Z23 Encounter for immunization: Secondary | ICD-10-CM

## 2022-05-29 NOTE — Patient Instructions (Addendum)
Please do your medications like I rec (like balancing a checkbook)   No change on your prednisone recommendations  Double strength flu shot today   Please schedule a follow up visit in 3 months but call sooner if needed

## 2022-05-29 NOTE — Assessment & Plan Note (Signed)
Recurrent pattern since feb 2017 Trial of gabapentin 100 tid 07/29/2016 >  Still tessalon dep 09/11/2016 so increased to 300 tid and ppi bid ac > not improved 11/11/2016 > rec max gerd rx and sugarless candy > resolved 12/19/2016  And off gabapentin 04/2017  - recurred early dec 2019 while off gabapentin and low dose ppi only  - 09/20/2018 rec max gerd rx and 1st gen H1 blockers per guidelines  And if not better start back on gabapentin  - 11/24/2019 recurred around November 07 2019 on return from car trip to [place] > rx pred/zpak/max gerd rx  > much better prior to 11/27/20 when caught wife's "cold"  - worse again p covid 19  08/2021 - Allergy screen  02/14/22 >  Eos 0.1 /  IgE  7   Continues with freq throat clearing daytime only > crec continue max gerd rx and hard rock candy, consider restarting gabapentin.  Also rec do home med reconciliation and showed him and wife how to do this.  Each maintenance medication was reviewed in detail including emphasizing most importantly the difference between maintenance and prns and under what circumstances the prns are to be triggered using an action plan format where appropriate.  Total time for H and P, chart review, counseling,  directly observing portions of ambulatory 02 saturation study/ and generating customized AVS unique to this office visit / same day charting > 30 min for multiple  refractory respiratory symptoms of uncertain etiology

## 2022-05-29 NOTE — Assessment & Plan Note (Signed)
Onset of symptoms feb 2017 with "photo neg pumonary edema pattern" Baseline PFTs 03/18/10  FVC  3.59 (80%) with ERV 67% and dlco 75 corrects to 106 - Allergy profile 12/19/15 >  Eos 0.2 /  IgE  89/ Pos Cat RAST only  12/19/2015  ESR 93 > started on prednisone 20 mg daily  HSP profile 12/19/15 >  Neg  - 01/07/2016 ESR down to 18 but only 30% improved symptomatically > rec continue 20 mg daily until better then 10 mg daily  - 02/01/2016 rec taper off x 6 weeks then ov > 05/16/2016 ESR = 58> restarted pred 06/03/16  - Collagen Vasc dz screen 07/29/2016  Neg  - PFT's  11/11/2016  FEV1 1.91 (56 % ) ratio 72  p 7 % improvement from saba p nothing prior to study with DLCO  42/43 % corrects to 77 % for alv volume but ERV 35%  - ESR  20 on 10 a/w 15 as  Of 11/11/2016 > rec try 10 mg daily  - Prednisone 5 mg daily as of 12/15/16 > taper to 5 mg qod by 02/10/2017 - 02/10/2017 ESR =  28  rec taper off x 2 weeks > no flare - 04/20/2017 ESR = 32 off pred since 02/2017  - 08/12/18  ESR = 16 with onset of cough  -  Cough recurred similar to Boop late November 2021  >  08/30/2020   Prednisone 10 mg x 2 daily until better then one daily until seen > cxr 09/26/2020 on 10 mg daily = wnl so rec taper off over 10 days  and see if cough flares  - 01/15/2021 flared again with taper to 5 mg so new floor of 10/5 and ceiling of 20 mg - 01/15/2021 rec bone density per PCP > osteopenia only - 05/29/2022   Walked on RA  x  2  lap(s) =  approx 350  ft  @ very slow pace, stopped due to tired/dizzy  with lowest 02 sats 94%     No indication for 02 or higher dose of prednisone so will continue celing of 20 mg and floor of 5 mg daily

## 2022-05-29 NOTE — Progress Notes (Signed)
Subjective:   Patient ID: Kurt Aguilar, male    DOB: [birth year]  MRN: [id number]    Brief patient profile:  78 yowm never cigarette smoker (just some cigars)  after cabg in [place] started noting recurrent winter cough regardless of where located in [place] ([place]/ [place]) with typical acute episode in [place] in Feb 2017  This  cough  more dry less severe than previous >  abx and pred > improved but did not resolve and worse when stopped it so restarted pred/abx > dx as pneumonia with variable as dz > referred to pulmonary clinic 12/19/2015 by [name] with 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   History of Present Illness  12/19/2015 1st [facility] office visit/ Corbitt Cloke   Chief Complaint  Patient presents with   Pulmonary Consult    Referred by [name]. Pt c/o cough x 4 wks- non prod and worse in the evening and when he lies down. Talking and exertion are things that trigger the cough. He has been on pred x 2 and both times cough resolved and immediately returns once done with med. He also c/o SOB- gets winded just walking from room to room at home.   while on prednisone still some weak and sob but better cough and last dose at least one week and worse overall since off it assoc with sob room to room and weakness/ excess/ purulent sputum or mucus plugs / no unusual exp / no ctd.  Has new CPAP machine fall 2016  rec  Omeprazole 20 mg Take 30- 60 min before your first and last meals of the day  Prednisone 10  X 2 each day until 100% better then 10 mg daily  Dx is Brochilitis obliterans with organizing pneumonia vs eosiniphic pneumonia most likely diagnosis GERD  Diet    09/11/2016  f/u ov/Rashida Ladouceur re: BOOP/uacs  on pred 10/5  prilosec 20 mg ac and hs  Chief Complaint  Patient presents with   Follow-up    Increased SOB for the past 3-4 days. He gets SOB with exertion such as getting dressed or walking accross the room.  He has also started coughing more "feels like I burned my  lungs"- prod with white sputum.     was doing ok on 10/5 in terms of breathing but worse gradually x one week p several weeks on the 10/5 dose  Cough some better but still needing freq tessalon on gabapentin 100 tid  rec Prednisone 10 mg  2 daily until better then 1 daily x 2 weeks then 10 alternating with 5 until return Prilosec should be 20 mg Take 30- 60 min before your first and last meals of the day  Increase gabapentin to 300 mg three times daily         Symptoms onset Dec 4th 2022  [place] 08/26/2021  acute visit  Prometh codeine    cough syrup Benzonatate perles- for cough Molnupiravir rx  and zpak  >>>  walking room to room at home/ cough never improved and pred rx up to 40 mg   Admit date:     09/17/2021   Discharge date: 09/24/21  Discharge Physician: [name]     Discharge Diagnoses     Acute coronary syndrome / NSTEMI(HCC) Acute kidney injury on CKD stage IIIb Lactic acidosis Shortness of breath with coughing   Essential hypertension   Right bundle branch block   CAD (coronary artery disease)   DM2 (diabetes mellitus, type 2) (  [place])   Steroid dependent (HCC)   AKI (acute kidney injury) ([place])   Stage 3b chronic kidney disease (CKD) [facility])         Hospital Course     Patient is a 76 year old male with history of CAD status post CABG in 1997, DM2, HTN, HLD, Boop, recent COVID-19 infection in December 2022, chronic steroid dependency presented to ED with mild intermittent chest pain for a month, worse with exertion, dyspnea.  Patient was found to have elevated troponins and was admitted for further work-up    NSTEMI [facility]) with known history of CAD status post CABG x5 in 1997 -2D echo showed no wall motion abnormality, normal EF, likely demand ischemia -Left heart cardiac cath showed multivessel native coronary artery diseasesaphenous vein to right coronary artery with 70% ostial to proximal stenosis, SVG to OM 1 and 2 patent, SVG to first diagonal patent,  LIMA to LAD patent.  -Continue aspirin, beta-blocker, Crestor, Zetia,       Essential hypertension -BP stable, continue beta-blocker    Hyperkalemia -Resolved   Acute on chronic CKD stage IIIb, lactic acidosis -Creatinine 2.34 on admission, baseline around 1.8-1.9 -Creatinine improved, close to baseline.  Completed IV fluid hydration.      Shortness of breath with cough -Per patient 4 weeks, dry persistent cough, possibly due to COVID-19 in December (positive on 12/4), underlying history of BOOP -CXR with no pneumonia or effusion.  VQ scan negative for PE.  Normal BNP -Continues to feel winded with exertion, coughing spasms. -Continue prednisone 40 mg daily with a taper upon discharge. (Outpatient on 10 mg BID) -Continue scheduled nebs, incentive spirometry, Tessalon Perles  -Home O2 evaluation done on 1/15, did not meet criteria for home O2   Hyperlipidemia -LDL 74 in 04/2021.  Continue Crestor 40 mg daily, Zetia 10 mg daily   Diabetes mellitus type 2, on long-term insulin with renal complications -Hemoglobin A1c 9.5 on 09/13/2021 --Continue insulin regimen, hopefully CBGs will continue to improve once prednisone taper is completed and back to maintenance dose   BOOP - follows Dr [name] outpatient, continue prednisone     Obesity Estimated body mass index is 32.39 kg/m as calculated from the following:   Height as of this encounter: '5\' 10"'$  (1.778 m).   Weight as of this encounter: 102.4 kg.  10/04/2021  f/u ov/Kailyn Vanderslice/ [facility] re: sob/ cough/ weak    maint on 20 mg prednisone   Chief Complaint  Patient presents with   Follow-up   Dyspnea:  SNF x 50 ft with walker/belt and one PT lowest sats 91%  but now anemic Cough: improving  Sleeping: 30 degrees and cpap  SABA use: none  02: none  Covid status:   vax x  5 Cp similar to angina p bm's x  7 min  Rec Encourage you to go ahead and use the cough medication as needed  Leave prednisone at 20 mg until better but goal  is to get it back 5 mg baseline      11/15/2021  f/u ov/Deserai Cansler/ [facility] re: BOOP  maint on Prednisone 20 mg   Chief Complaint  Patient presents with   Follow-up  Dyspnea:  walked with PT  for 6 min moderate with cp toward the end but did not check sats/ cp always better p ntg immediately / cards aware and now on long acting nitrates  Cough: better  Sleeping: 30 degrees cpap no 02  SABA use: none 02: none  Covid status:  vax x 5   Rec Prednisone 20 mg each am it a floor of 10 mg starting with 20 /15 alternate days for a full week - go back to the previous dose that worked and do the taper 2 weeks at a time Check 02 sats at peak exertion, not after you stop with goal of keeping over 90%     05/29/2022  f/u ov/Andrey Hoobler/ [facility] re: BOOP  maint on prednisone 10 mg  with worse cough at 5 mg  Chief Complaint  Patient presents with   Follow-up    More sob with less activity and more fatigued over the past few weeks.  Sleeps a lot, wears a CPAP.  Dyspnea:  no exercise / walking 25-50 ft max with fatigue limiting and presyncope but less sob and no cp Cough: better at 10 mg daily  Sleeping: cpap / 30 degrees up  SABA use: none 02: none  Light headed standing x months rx by Cards    No obvious day to day or daytime variability or assoc excess/ purulent sputum or mucus plugs or hemoptysis or cp or chest tightness, subjective wheeze or overt sinus or hb symptoms.   Sleeping as above without nocturnal  or early am exacerbation  of respiratory  c/o's or need for noct saba. Also denies any obvious fluctuation of symptoms with weather or environmental changes or other aggravating or alleviating factors except as outlined above   No unusual exposure hx or h/o childhood pna/ asthma or knowledge of premature birth.  Current Allergies, Complete Past Medical History, Past Surgical History, Family History, and Social History were reviewed in [employer]  record.  ROS  The following are not active complaints unless bolded Hoarseness, sore throat, dysphagia, dental problems, itching, sneezing,  nasal congestion or discharge of excess mucus or purulent secretions, ear ache,   fever, chills, sweats, unintended wt loss or wt gain, classically pleuritic or exertional cp,  orthopnea pnd or arm/hand swelling  or leg swelling, presyncope, palpitations, abdominal pain, anorexia, nausea, vomiting, diarrhea  or change in bowel habits or change in bladder habits, change in stools or change in urine, dysuria, hematuria,  rash, arthralgias, visual complaints, headache, numbness, weakness or ataxia or problems with walking or coordination,  change in mood or  memory.         Current Meds  Medication Sig   allopurinol (ZYLOPRIM) 100 MG tablet TAKE 1 TABLET BY MOUTH TWICE A DAY   Aspirin 325 MG CAPS    aspirin EC (ASPIRIN 81) 81 MG tablet Take 81 mg by mouth daily. Swallow whole.   benzonatate (TESSALON) 200 MG capsule Take 1 capsule (200 mg total) by mouth 3 (three) times daily as needed for cough.   Cholecalciferol (VITAMIN D3 PO) Take 1 capsule by mouth daily.   colchicine 0.6 MG tablet TAKE 1 TABLET BY MOUTH TWICE A DAY WITH A MEAL AS NEEDED FOR ACUTE FLARE OF GOUT   Continuous Blood Gluc Receiver (FREESTYLE LIBRE 2 READER) DEVI USE AS INSTRUCTED TO CHECK BLOOD SUGARS.   Continuous Blood Gluc Sensor (FREESTYLE LIBRE 2 SENSOR) MISC 1 Device by Does not apply route every 14 (fourteen) days.   empagliflozin (JARDIANCE) 10 MG TABS tablet Take 1 tablet (10 mg total) by mouth daily.   empagliflozin (JARDIANCE) 10 MG TABS tablet Take 1 tablet by mouth daily.   ezetimibe (ZETIA) 10 MG tablet TAKE 1 TABLET BY MOUTH EVERY DAY   famotidine (PEPCID) 20 MG tablet TAKE 1  TABLET BY MOUTH EVERYDAY AT BEDTIME   furosemide (LASIX) 20 MG tablet Take 0.5 tablet (10 mg) by mouth once EVERY OTHER day   Glucosamine HCl (GLUCOSAMINE PO) Take 1,500 mg by mouth 2 (two) times daily.     glucose blood (FREESTYLE LITE) test strip 1 each by Other route 4 (four) times daily. E11.65   HYDROcodone bit-homatropine (HYCODAN) 5-1.5 MG/5ML syrup Take 5 mLs by mouth every 6 (six) hours as needed for cough.   insulin isophane & regular human KwikPen (NOVOLIN 70/30 KWIKPEN) (70-30) 100 UNIT/ML KwikPen Inject 30 Units into the skin daily with breakfast. And pen needles 1/day   Insulin Pen Needle 31G X 8 MM MISC Use as instructed to inject insulin 1x daily   isosorbide mononitrate (IMDUR) 30 MG 24 hr tablet Take 1 tablet by mouth daily.   Lancets (FREESTYLE) lancets 1 EACH BY OTHER ROUTE 4 (FOUR) TIMES DAILY. E11.65        Lutein 20 MG CAPS Take 20 mg by mouth daily.    methocarbamol (ROBAXIN) 500 MG tablet Take 1 tablet (500 mg total) by mouth every 8 (eight) hours as needed for muscle spasms. Caution of sedation   metoprolol tartrate (LOPRESSOR) 25 MG tablet Take 0.5 tablets (12.5 mg total) by mouth 2 (two) times daily.   MODERNA COVID-19 BIVALENT 50 MCG/0.5ML injection    molnupiravir EUA (LAGEVRIO) 200 MG CAPS capsule    nitroGLYCERIN (NITROSTAT) 0.4 MG SL tablet Place 1 tablet (0.4 mg total) under the tongue every 5 (five) minutes x 3 doses as needed for chest pain.   omeprazole (PRILOSEC) 40 MG capsule TAKE 1 CAPUSLE BY MOUTH 30- 60 MIN BEFORE YOUR FIRST AND LAST MEALS OF THE DAY   ondansetron (ZOFRAN) 8 MG tablet Take 1 tablet (8 mg total) by mouth every 8 (eight) hours as needed for nausea or vomiting. From narcotic pain medicine   OZEMPIC, 2 MG/DOSE, 8 MG/3ML SOPN Inject 2 mg into the skin once a week.   polyethylene glycol (MIRALAX / GLYCOLAX) 17 g packet Take 17 g by mouth daily.   predniSONE (DELTASONE) 10 MG tablet Take prednisone 30 mg (3 tabs) for 3 days, then continue your maintenance dose of prednisone (Patient taking differently: Take 10 mg by mouth daily with breakfast. Take prednisone 30 mg (3 tabs) for 3 days, then continue your maintenance dose of prednisone 10 mg daily)    rosuvastatin (CRESTOR) 40 MG tablet Take 1 tablet (40 mg total) by mouth daily.                              Objective:   Physical Exam   Wts  05/29/2022       239 02/14/2022         238  11/15/2021        236  10/04/2021        230  05/22/2021        230   01/15/2021       231  09/26/2020        220  11/24/2019        206  09/20/2018        222  08/26/2018      222 07/29/2017      232  04/15/2017          243  02/10/2017          250  12/19/2016  250 11/11/2016          258  09/11/2016          252  07/29/2016      244  05/16/2016          238  02/01/2016        235  01/07/2016          228   12/19/15 229 lb 4 oz (103.987 kg)  12/19/15 229 lb 4 oz (103.987 kg)  12/19/15 227 lb (102.967 kg)    Vital signs reviewed  05/29/2022  - Note at rest 02 sats  97% on RA   General appearance:    wc bound  obese elderly wm freq throat clearing     HEENT : Oropharynx  clear/ no pnd     Nasal turbinates nl   NECK :  without  apparent JVD/ palpable Nodes/TM    LUNGS: no acc muscle use,  Nl contour chest with trace insp crackles in bases  bilaterally without cough on insp or exp maneuvers   CV:  RRR  no s3 or murmur or increase in P2, and elastic hose both LE s edema  ABD:  Obese, soft and nontender with nl inspiratory excursion    MS:    ext warm without deformities Or obvious joint restrictions  calf tenderness, cyanosis   NO clubbing    SKIN: warm and dry without lesions    NEURO:  alert, approp, nl sensorium with  no motor or cerebellar deficits apparent.       Lab Results  Component Value Date   ESRSEDRATE 13 02/14/2022   ESRSEDRATE 16 08/12/2018   ESRSEDRATE 32 (H) 04/20/2017         Assessment & Plan:

## 2022-06-09 ENCOUNTER — Telehealth: Payer: Self-pay | Admitting: Family Medicine

## 2022-06-09 NOTE — Telephone Encounter (Signed)
What is the order number for just pharmacy without other services He has medicare, thanks !

## 2022-06-09 NOTE — Telephone Encounter (Signed)
Pt's wife called asking is there any chance [facility] could refer pt to a chronic care pharmacist? Pt has been fainting, has bruises on body, bp drops. Pt just needs someone to go over meds. Call back # [phone]

## 2022-06-11 ENCOUNTER — Other Ambulatory Visit: Payer: Self-pay

## 2022-06-11 DIAGNOSIS — E1165 Type 2 diabetes mellitus with hyperglycemia: Secondary | ICD-10-CM

## 2022-06-11 MED ORDER — EMPAGLIFLOZIN 10 MG PO TABS: 10.0000 mg | ORAL_TABLET | Freq: Every day | ORAL | 2 refills | Status: DC

## 2022-06-12 NOTE — Telephone Encounter (Signed)
Contacted the patient and made face to face appointment for pharmacist, he will bring all mediations .  [name], CPP notified  [name], [place]  [phone]

## 2022-06-13 ENCOUNTER — Telehealth: Payer: Self-pay | Admitting: Pharmacist

## 2022-06-13 ENCOUNTER — Ambulatory Visit: Payer: PPO | Admitting: Pharmacist

## 2022-06-13 DIAGNOSIS — G4733 Obstructive sleep apnea (adult) (pediatric): Secondary | ICD-10-CM | POA: Diagnosis not present

## 2022-06-13 DIAGNOSIS — E1169 Type 2 diabetes mellitus with other specified complication: Secondary | ICD-10-CM

## 2022-06-13 DIAGNOSIS — E1122 Type 2 diabetes mellitus with diabetic chronic kidney disease: Secondary | ICD-10-CM

## 2022-06-13 DIAGNOSIS — I10 Essential (primary) hypertension: Secondary | ICD-10-CM

## 2022-06-13 DIAGNOSIS — I2510 Atherosclerotic heart disease of native coronary artery without angina pectoris: Secondary | ICD-10-CM

## 2022-06-13 DIAGNOSIS — I5032 Chronic diastolic (congestive) heart failure: Secondary | ICD-10-CM

## 2022-06-13 DIAGNOSIS — N1832 Chronic kidney disease, stage 3b: Secondary | ICD-10-CM

## 2022-06-13 MED ORDER — METOPROLOL SUCCINATE ER 25 MG PO TB24: 12.5000 mg | ORAL_TABLET | Freq: Every day | ORAL | 1 refills | Status: DC

## 2022-06-13 NOTE — Progress Notes (Signed)
Care Management Pharmacy Note  06/18/2022 Name:  Kurt Aguilar MRN:  [id number] DOB:  08-20-1946  Summary: -Pt has had issues with orthostasis and falls, last episode was last week when he fell after standing from his chair. Reviewed medications and counseled on fall prevention. -HF/HTN: BP will drop 30-40 points upon standing; he is on relatively low doses of furosemide and metoprolol tartrate DM: A1c 6.6% (04/2022); pt endorses occasional nausea, discussed Ozempic may be contributing and eating smaller meals, fewer carbs/fats to limit this side effect  Recommendations/Changes made from today's visit: -Change metoprolol tartrate to succinate 12.5 mg daily at bedtime - discussed with cardiology  Plan: -Pharmacist follow up televisit scheduled for 1 month    Subjective: Kurt Aguilar is an 76 y.o. year old male who is a primary patient of Tower, [name], MD.  The CCM team was consulted for assistance with disease management and care coordination needs.    Engaged with patient face to face for initial visit in response to provider referral for pharmacy case management and/or care coordination services.     Patient Care Team: Tower, [name], MD as PCP - General [name], Kathlene November, MD as PCP - Cardiology (Cardiology) Minna Merritts, MD as Consulting Physician (Cardiology) Tanda Rockers, MD as Consulting Physician (Pulmonary Disease) Buren Kos., MD as Referring Physician (Dentistry) [name], [facility] as Pharmacist (Pharmacist)  Recent office visits: 04/16/22 [name] OV: annual - BP trending low. Unsure if he can cut back on metoprolol - reach out to cardiology.  Recent consult visits: 05/29/22 Dr [name] (Pulmonology): BOOP, chronic cough. Maintained on prednisone 10 mg.  05/06/22 Dr Cruzita Lederer (Endocrine): A1c 6.6. Move Jardiance to before breakfast.  04/21/22 [name] (Nephrology): BP 95/66. Try compression stockings. Take furosemide only PRN for significant  edema.  04/18/22 cardiology pt message - decrease lasix to QOD, decrease metoprolol to 12.5 mg BID.  02/25/22 Dr [name] OV: CAD - cardiac cath 09/2021; continue to hold isosorbide; hold lasix for orthostasis, take QOD.   Hospital visits: None in previous 6 months   Objective:  Lab Results  Component Value Date   CREATININE 2.33 (H) 04/09/2022   BUN 41 (H) 04/09/2022   GFR 26.12 (L) 04/09/2022   EGFR 35 (L) 10/17/2021   GFRNONAA 28 (L) 12/18/2021   GFRAA 39 (L) 05/17/2020   NA 141 04/09/2022   K 4.0 04/09/2022   CALCIUM 9.6 04/09/2022   CO2 23 04/09/2022   GLUCOSE 131 (H) 04/09/2022    Lab Results  Component Value Date/Time   HGBA1C 6.6 (A) 05/06/2022 01:40 PM   HGBA1C 6.7 (A) 01/09/2022 03:24 PM   HGBA1C 6.6 (H) 05/17/2020 02:44 PM   HGBA1C 6.3 03/24/2019 09:25 AM   FRUCTOSAMINE 218 01/05/2017 12:02 PM   GFR 26.12 (L) 04/09/2022 08:11 AM   GFR 34.15 (L) 08/26/2021 10:51 AM   MICROALBUR 3.2 (H) 04/16/2022 11:56 AM   MICROALBUR 1.1 04/01/2019 03:34 PM    Last diabetic Eye exam:  Lab Results  Component Value Date/Time   HMDIABEYEEXA No Retinopathy 11/28/2020 12:00 AM    Last diabetic Foot exam: No results found for: "HMDIABFOOTEX"   Lab Results  Component Value Date   CHOL 169 04/09/2022   HDL 51.80 04/09/2022   LDLCALC 33 02/14/2022   LDLDIRECT 78.0 04/09/2022   TRIG 325.0 (H) 04/09/2022   CHOLHDL 3 04/09/2022       Latest Ref Rng & Units 04/09/2022    8:11 AM 11/17/2021  9:08 PM 09/17/2021   11:23 PM  Hepatic Function  Total Protein 6.0 - 8.3 g/dL 6.8  6.5  5.3   Albumin 3.5 - 5.2 g/dL 4.5  3.8  3.0   AST 0 - 37 U/L 24  44  25   ALT 0 - 53 U/L 35  51  36   Alk Phosphatase 39 - 117 U/L 51  38  33   Total Bilirubin 0.2 - 1.2 mg/dL 1.0  1.1  0.8   Bilirubin, Direct 0.0 - 0.2 mg/dL  0.2  0.2     Lab Results  Component Value Date/Time   TSH 2.25 04/09/2022 08:11 AM   TSH 3.86 04/09/2021 07:54 AM       Latest Ref Rng & Units 04/09/2022    8:11 AM  02/14/2022    9:50 AM 12/18/2021    4:35 PM  CBC  WBC 4.0 - 10.5 K/uL 12.1  12.1  9.8   Hemoglobin 13.0 - 17.0 g/dL 13.8  14.3  12.4   Hematocrit 39.0 - 52.0 % 42.9  44.0  38.4   Platelets 150.0 - 400.0 K/uL 250.0  220  218     No results found for: "VD25OH"  Clinical ASCVD: Yes  The ASCVD Risk score (Arnett DK, et al., 2019) failed to calculate for the following reasons:   The patient has a prior MI or stroke diagnosis       04/17/2022    2:18 PM 04/16/2022   10:05 AM 04/10/2021   11:24 AM  Depression screen PHQ 2/9  Decreased Interest 0 0 0  Down, Depressed, Hopeless 0 0 0  PHQ - 2 Score 0 0 0  Altered sleeping 0  0  Tired, decreased energy 0  0  Change in appetite 0  0  Feeling bad or failure about yourself  0  0  Trouble concentrating 0  0  Moving slowly or fidgety/restless 0  0  Suicidal thoughts 0  0  PHQ-9 Score 0  0  Difficult doing work/chores Not difficult at all  Not difficult at all    Social History   Tobacco Use  Smoking Status Former   Packs/day: 1.00   Years: 3.00   Total pack years: 3.00   Types: Cigars, Cigarettes   Quit date: 11/25/2004   Years since quitting: 17.5  Smokeless Tobacco Never  Tobacco Comments   occas. cigar quit 2006   BP Readings from Last 3 Encounters:  05/29/22 130/80  05/06/22 120/88  02/25/22 118/80   Pulse Readings from Last 3 Encounters:  05/29/22 82  05/06/22 90  02/25/22 86   Wt Readings from Last 3 Encounters:  05/29/22 239 lb 9.6 oz (108.7 kg)  05/06/22 239 lb (108.4 kg)  04/17/22 242 lb (109.8 kg)   BMI Readings from Last 3 Encounters:  05/29/22 34.13 kg/m  05/06/22 33.84 kg/m  04/17/22 34.26 kg/m    Assessment/Interventions: Review of patient past medical history, allergies, medications, health status, including review of consultants reports, laboratory and other test data, was performed as part of comprehensive evaluation and provision of chronic care management services.   SDOH:  (Social Determinants of  Health) assessments and interventions performed: No SDOH Interventions    Flowsheet Row Clinical Support from 04/17/2022 in [place] at [place] from 04/10/2021 in [place] at [place] from 04/09/2020 in [place] at [place] Interventions Intervention Not Indicated -- --  Housing Interventions Intervention Not Indicated -- --  Transportation Interventions Intervention Not Indicated -- --  Depression Interventions/Treatment  -- PHQ2-9 Score <4 Follow-up Not Indicated PHQ2-9 Score <4 Follow-up Not Indicated  Financial Strain Interventions Intervention Not Indicated -- --  Physical Activity Interventions Intervention Not Indicated -- --  Stress Interventions Intervention Not Indicated -- --  Social Connections Interventions Intervention Not Indicated -- --      SDOH Screenings   Food Insecurity: No Food Insecurity (04/17/2022)  Housing: Low Risk  (04/17/2022)  Transportation Needs: No Transportation Needs (04/17/2022)  Alcohol Screen: Low Risk  (04/17/2022)  Depression (PHQ2-9): Low Risk  (04/17/2022)  Financial Resource Strain: Low Risk  (04/17/2022)  Physical Activity: Insufficiently Active (04/17/2022)  Social Connections: Moderately Integrated (04/17/2022)  Stress: No Stress Concern Present (04/17/2022)  Tobacco Use: Medium Risk (05/29/2022)    CCM Care Plan  Allergies  Allergen Reactions   Oxycodone-Acetaminophen Other (See Comments)    Stops breathing; has tolerated Tylenol before   Oxycodone-Acetaminophen Anaphylaxis    Medications Reviewed Today     Reviewed by [name], [facility] (Pharmacist) on 06/13/22 at 1053  Med List Status: <None>   Medication Order Taking? Sig Documenting Provider Last Dose Status Informant  allopurinol (ZYLOPRIM) 100 MG tablet [id number] Yes TAKE 1 TABLET BY MOUTH TWICE A DAY Tower, Marne A, MD Taking Active   aspirin EC (ASPIRIN 81) 81  MG tablet [id number] Yes Take 81 mg by mouth daily. Swallow whole. [provider] Taking Active   benzonatate (TESSALON) 200 MG capsule [id number] Yes Take 1 capsule (200 mg total) by mouth 3 (three) times daily as needed for cough. Mendel Corning, MD Taking Active   Calcium Carbonate-Vit D-Min (CALCIUM-VITAMIN D-MINERALS) 600-800 MG-UNIT CHEW [id number] Yes Chew 1 tablet by mouth daily. [provider] Taking Active   colchicine 0.6 MG tablet [id number] Yes TAKE 1 TABLET BY MOUTH TWICE A DAY WITH A MEAL AS NEEDED FOR ACUTE FLARE OF GOUT Tower, [name], MD Taking Active Spouse/Significant Other  Continuous Blood Gluc Receiver (FREESTYLE LIBRE 2 READER) [place] [id number] Yes USE AS INSTRUCTED TO CHECK BLOOD SUGARS. Renato Shin, MD Taking Active   Continuous Blood Gluc Sensor (FREESTYLE LIBRE 2 SENSOR) [place] [id number] Yes 1 Device by Does not apply route every 14 (fourteen) days. Renato Shin, MD Taking Active   empagliflozin (JARDIANCE) 10 MG TABS tablet [id number] Yes Take 1 tablet (10 mg total) by mouth daily. Philemon Kingdom, MD Taking Active   ezetimibe (ZETIA) 10 MG tablet [id number] Yes TAKE 1 TABLET BY MOUTH EVERY DAY Tower, [name], MD Taking Active   famotidine (PEPCID) 20 MG tablet [id number] Yes TAKE 1 TABLET BY MOUTH EVERYDAY AT BEDTIME Tanda Rockers, MD Taking Active   furosemide (LASIX) 20 MG tablet [id number] Yes Take 0.5 tablet (10 mg) by mouth once EVERY OTHER day Minna Merritts, MD Taking Active   Glucosamine HCl (GLUCOSAMINE PO) [id number] Yes Take 1,500 mg by mouth 2 (two) times daily.  [provider] Taking Active Spouse/Significant Other  glucose blood (FREESTYLE LITE) test strip [id number] Yes 1 each by Other route 4 (four) times daily. E11.65 Renato Shin, MD Taking Active Spouse/Significant Other  insulin isophane & regular human KwikPen (NOVOLIN 70/30 KWIKPEN) (70-30) 100 UNIT/ML KwikPen [id number] Yes Inject 30 Units into the skin daily with breakfast.  And pen needles 1/day  Patient taking differently: Inject 25 Units into the skin daily with breakfast. And pen needles 1/day   Renato Shin, MD Taking Active  Insulin Pen Needle 31G X 8 MM MISC [id number] Yes Use as instructed to inject insulin 1x daily Philemon Kingdom, MD Taking Active   isosorbide mononitrate (IMDUR) 30 MG 24 hr tablet [id number] Yes Take 1 tablet by mouth daily. [provider] Taking Active   Lancets (FREESTYLE) lancets [id number] Yes 1 EACH BY OTHER ROUTE 4 (FOUR) TIMES DAILY. E11.65 Renato Shin, MD Taking Active Spouse/Significant Other  Lutein 20 MG CAPS [id number] Yes Take 20 mg by mouth daily.  [provider] Taking Active Spouse/Significant Other  methocarbamol (ROBAXIN) 500 MG tablet [id number] Yes Take 1 tablet (500 mg total) by mouth every 8 (eight) hours as needed for muscle spasms. Caution of sedation Loni Dolly, PA-C Taking Active Spouse/Significant Other  metoprolol tartrate (LOPRESSOR) 25 MG tablet [id number] Yes Take 0.5 tablets (12.5 mg total) by mouth 2 (two) times daily. Minna Merritts, MD Taking Active   nitroGLYCERIN (NITROSTAT) 0.4 MG SL tablet [id number] Yes Place 1 tablet (0.4 mg total) under the tongue every 5 (five) minutes x 3 doses as needed for chest pain. Rai, Vernelle Emerald, MD Taking Active   omeprazole (PRILOSEC) 40 MG capsule [id number] Yes TAKE 1 CAPUSLE BY MOUTH 30- 60 MIN BEFORE YOUR FIRST AND LAST MEALS OF THE DAY Tanda Rockers, MD Taking Active Spouse/Significant Other  ondansetron (ZOFRAN) 8 MG tablet [id number] Yes Take 1 tablet (8 mg total) by mouth every 8 (eight) hours as needed for nausea or vomiting. From narcotic pain medicine Tower, [name], MD Taking Active Spouse/Significant Other  OZEMPIC, 2 MG/DOSE, 8 MG/3ML SOPN [id number] Yes Inject 2 mg into the skin once a week. [provider] Taking Active   polyethylene glycol (MIRALAX / GLYCOLAX) 17 g packet [id number] Yes Take 17 g by mouth daily. [provider] Taking Active   predniSONE (DELTASONE) 10 MG tablet [id number] Yes Take prednisone 30 mg (3 tabs) for 3 days, then continue your maintenance dose of prednisone  Patient taking differently: Take 10 mg by mouth daily with breakfast. Take prednisone 30 mg (3 tabs) for 3 days, then continue your maintenance dose of prednisone 10 mg daily   Rai, Ripudeep K, MD Taking Active   rosuvastatin (CRESTOR) 40 MG tablet [id number] Yes Take 1 tablet (40 mg total) by mouth daily. Tower, [name], MD Taking Active   Med List Note Annamaria Boots, [place] D, MD 10/05/11 1705): CPAP 11/ [facility]            Patient Active Problem List   Diagnosis Date Noted   Lumbar pain 01/30/2022   Chronic diastolic heart failure ([place]) 10/23/2021   AKI (acute kidney injury) ([place]) 09/17/2021   Stage 3b chronic kidney disease (CKD) ([place]) 09/17/2021   Acute coronary syndrome ([place]) 09/17/2021   Osteopenia 05/16/2021   Steroid dependent ([place]) 04/12/2021   Primary osteoarthritis of left knee 05/29/2020   Colon cancer screening 02/13/2017   Poorly controlled type 2 diabetes mellitus with circulatory disorder ([place]) 08/13/2016   Chronic heel pain, left 08/12/2016   Upper airway cough syndrome 07/31/2016   Class 1 obesity with serious comorbidity and body mass index (BMI) of 32.0 to 32.9 in adult 05/19/2016   Routine general medical examination at a health care facility 01/27/2016   Pulmonary infiltrates with high  ESR c/w BOOP/ idiopathic  12/19/2015   Cough 11/29/2015   Cystic kidney disease 10/01/2015   Renal lesion 09/27/2015   Erectile dysfunction of organic origin 09/27/2015   BPH with obstruction/lower urinary tract symptoms 09/27/2015   Constipation  09/24/2015   Cyst of right kidney 09/24/2015   CAD (coronary artery disease) 06/08/2015   Grieving 11/22/2014   Encounter for Medicare annual wellness exam 07/11/2013   Prostate cancer screening 07/03/2013   Right bundle branch block 02/22/2013   Chronic low back  pain 05/13/2011   Obstructive sleep apnea 03/15/2010   Hyperlipidemia associated with type 2 diabetes mellitus ([place]) 04/12/2009   Gout 04/12/2009   Essential hypertension 04/12/2009   Coronary artery disease of bypass graft of native heart with stable angina pectoris ([place]) 04/12/2009   GERD 04/12/2009   Renal insufficiency 04/12/2009   DIVERTICULITIS, HX OF 04/12/2009    Immunization History  Administered Date(s) Administered   Fluad Quad(high Dose 65+) 06/25/2019, 05/29/2022   Influenza Split 07/09/2011   Influenza Whole 05/14/2010, 06/09/2012   Influenza, High Dose Seasonal PF 05/22/2014, 07/04/2015, 05/16/2016, 07/09/2018   Influenza,inj,Quad PF,6+ Mos 07/11/2013   Influenza-Unspecified 07/03/2017, 06/14/2021   Moderna Sars-Covid-2 Vaccination 11/18/2019, 12/27/2019, 07/24/2020, 04/12/2021, 06/14/2021   Pneumococcal Conjugate-13 08/07/2014   Pneumococcal Polysaccharide-23 09/08/2002, 07/11/2013   Td 09/08/2004, 02/12/2016   Zoster Recombinat (Shingrix) 04/06/2018, 06/17/2018   Zoster, Live 07/09/2011    Conditions to be addressed/monitored:  Hypertension, Hyperlipidemia, Diabetes, Heart Failure, and Coronary Artery Disease  Care Plan : General Pharmacy (Adult)  Updates made by [name], [place] since 06/18/2022 12:00 AM     Problem: Disease mgmt   Priority: High  Note:   Current Barriers:  Orthostasis/Falls  Pharmacist Clinical Goal(s):  Patient will adhere to plan to optimize therapeutic regimen for  as evidenced by report of adherence to recommended medication management changes through collaboration with PharmD and provider.   Interventions: 1:1 collaboration with Tower, [name], MD regarding development and update of comprehensive plan of care as evidenced by provider attestation and co-signature Inter-disciplinary care team collaboration (see longitudinal plan of care) Comprehensive medication review performed; medication list updated in electronic  medical record  Hypertension / Heart Failure (BP goal <130/80) -Query controlled - pt reports significant orthostasis leading to passing out/falls -Last ejection fraction: 60-65% (Date: 09/2021) -HF type: Diastolic (Grade I dysfunction) -Pt weights himself daily, reports dry weight around ~235#, currently 240# -Current treatment: Furosemide 20 mg - 1/2 tab QOD - Appropriate, Effective, Query Safe Metoprolol tartrate 25 mg - 1/2 tab BID -Appropriate, Effective, Query Safe Jardiance 10 mg daily -Appropriate, Effective, Safe, Accessible -Medications previously tried: isosorbide   -Educated on BP goals and benefits of medications for prevention of heart attack, stroke and kidney damage; Symptoms of hypotension and importance of maintaining adequate hydration; -Educated on Importance of weighing daily; if you gain more than 3 pounds in one day or 5 pounds in one week, take whole tablet of furosemide -Counseled to monitor BP at home daily -Reviewed medications at length for contributions to orthostasis; discussed using furosemide PRN for wt gain/swelling; pt may benefit from long acting metoprolol taken at bedtime -Recommend to switch metoprolol tartrate to succinate 12.5 mg daily at bedtime - consulting with cardiology  Hyperlipidemia: (LDL goal < 70) -Controlled - LDL 78 (04/2022), TRIG 325 -Hx CAD; CABG 1987, cardiac cath 09/1021 - diffuse disease, not a candidate for intervention -Current treatment: Rosuvastatin 40 mg daily - Appropriate, Effective, Safe, Accessible Ezetimibe 10 mg daily -Appropriate, Effective, Safe, Accessible Aspirin 81 mg daily -Appropriate, Effective, Safe, Accessible Nitroglycerin 0.4 mg SL prn -Appropriate, Effective, Safe, Accessible -Medications previously tried: n/a  -Current exercise habits: limited -Educated on Cholesterol goals; Benefits of statin for ASCVD risk reduction; -Recommended to continue  current medication  Diabetes (A1c goal <7%) -Controlled - A1c  6.6% (04/2022); he complains of of occasional nausea, discussed this may be related to Ozempic - discussed eating smaller meals, fewer carbs/fats to prevent nausea Reviewed AGP report: 04/23/22 to 05/06/22. Sensor active: 77%  Time in range (70-180): 92% (goal > 70%)  High (>180): 8%  Low (< 70): 0% (goal < 4%)  GMI: 6.4%; Average glucose: 131 -Current medications: Jardiance 10 mg daily - Appropriate, Query Effective (GFR < 30) Novolin 70/30 - 30 units daily AM -Appropriate, Effective, Safe, Accessible Ozempic 2 mg weekly -Appropriate, Effective, Safe, Accessible Freestyle Libre 2 -Appropriate, Effective, Safe, Accessible -Medications previously tried: n/a  -Denies hypoglycemic/hyperglycemic symptoms -Educated on A1c and blood sugar goals; Prevention and management of hypoglycemic episodes; Continuous glucose monitoring; -Discussed Jardiance is probably not doing much for DM given GFR < 30, but would continue for HF/CKD benefits -Recommended to continue current medication  GERD (Goal: minimize symptoms of reflux ) -Controlled -Current treatment  Famotidine 20 mg daily HS - Appropriate, Effective, Safe, Accessible Omeprazole 40 mg daily BID -Appropriate, Effective, Safe, Accessible -Medications previously tried: none reported  -Hx of Bleeds/ulcers: No -Counseled on small meals, elevating head, and sleeping on left side -Recommended to continue current medication  Gout (Goal: Prevent gout flares) -Controlled -Last Gout Flare: years ago - prior to allopurinol -Current treatment  Allopurinol 100 mg BID - Appropriate, Effective, Safe, Accessible Colchicine 0.6 mg PRN -Appropriate, Effective, Safe, Accessible -Medications previously tried: n/a  -We discussed:  Counseled patient on low purine diet plan. Counseled patient to reduce consumption of high-fructose corn syrup, sweetened soft drinks, fruit juices, meat, and seafood. -Recommended to continue current medication  Chronic Kidney  Disease Stage 4  -All medications assessed for renal dosing and appropriateness in chronic kidney disease. -Recommended to continue current medication  BOOP (Goal: manage symptoms) -Follows with pulmonology. No indication for O2 as of 05/2022 walk test -Hx OSA, on CPAP -Current treatment  Prednisone 10 mg daily - Appropriate, Effective, Safe, Accessible Benzonatate 200 mg -Appropriate, Effective, Safe, Accessible -Medications previously tried: gabapentin  -Recommended to continue current medication  Health Maintenance -Current therapy:  Miralax Zofran PRN Glucosamine Lutein  Vitamin D3 -Patient is satisfied with current therapy and denies issues -Recommended to continue current medication  Patient Goals/Self-Care Activities Patient will:  - take medications as prescribed as evidenced by patient report and record review focus on medication adherence by routine check glucose using CGM, document, and provide at future appointments check blood pressure daily, document, and provide at future appointments weigh daily, and contact provider if weight gain of 3+ lbs overnight, 5+ lbs in a week      Medication Assistance: None required.  Patient affirms current coverage meets needs.  Compliance/Adherence/Medication fill history: Care Gaps: None  Star-Rating Drugs: Jardiance - PDC 89% Rosuvastatin - PDC 96%  Medication Access: Within the past 30 days, how often has patient missed a dose of medication? 0 Is a pillbox or other method used to improve adherence? Yes  Factors that may affect medication adherence? no barriers identified Are meds synced by current pharmacy? No  Are meds delivered by current pharmacy? No  Does patient experience delays in picking up medications due to transportation concerns? No   [facility] Services Reviewed: Is patient disadvantaged to use [facility]?: No  Current Rx insurance plan: HTA Name and location of [facility] pharmacy[facility]  [id number] [place] [id number]:  [phone] Fax: [phone]  [facility] services reviewed with patient today?: No  Patient requests to transfer care to [facility]?: No  Reason patient declined to change pharmacies: Not mentioned at this visit   Care Plan and Follow Up Patient Decision:  Patient agrees to Care Plan and Follow-up.  Plan: Telephone follow up appointment with care management team member scheduled for:  1 month  [name], PharmD, [facility] Clinical Pharmacist [facility] [phone]

## 2022-06-13 NOTE — Telephone Encounter (Signed)
-----   Message from Minna Merritts, MD sent at 06/13/2022  3:45 PM EDT ----- Regarding: RE: Orthostasis/Falls That would be fine thanks TGollan ----- Message ----- From: [name], [place] [facility] Sent: 06/13/2022   3:35 PM EDT To: Minna Merritts, MD Subject: Orthostasis/Falls                              Dr [name],  I am a clinical pharmacist working with [name], at her request I reviewed Mr An's medications with him due to his frequent dizziness and falls. He is currently taking metoprolol tartrate 12.5 mg BID (reduced dose in August per your instruction), and he is still having these orthostatic events.   I thought it may be worth switching to metoprolol succinate 12.5 mg daily for its more consistent pharmacokinetics  - would you consider this change?  Best, [name], PharmD, BCACP Clinical Pharmacist [place] [facility] at [facility] [phone]

## 2022-06-13 NOTE — Telephone Encounter (Signed)
[facility]:  metoprolol succinate 25 mg - 1/2 tab daily #45 1 RF

## 2022-06-18 ENCOUNTER — Other Ambulatory Visit: Payer: Self-pay | Admitting: Internal Medicine

## 2022-06-18 NOTE — Progress Notes (Signed)
HPI- male former smoker followed for OSA, bronchitis,Upper Airway Cough, complicated by OZ3/ Hyperlipidemia, CAD/CABG/ RBBB, HTN,  GERD, hx BOOP, BPH,   ----------------------------------------------------------------------------------    08/26/21- 76 year old male former smoker(only cigars) followed for OSA, bronchitis,  BOOP ( infiltrates w high ESR- Dr [name]), complicated by DM, CAD/CABG, Obesity,  -Prednisone 30 mg daily, Zpak CPAP 10-20 /[employer]-compliance compliance 93%, AHI 6.5/ hr Body weight today- Covid vax-5 Moderna Flu vax-had Acute visit- tested Pos Covid 12/3. Sent Zpak. Cough. -----Patient says he's not getting any better.  He is usually on prednisone 5 or 10 mg for chronic Boop with instruction to increase as needed.  In the last 4 days he has gone up to 30 mg daily.  Has not retested for COVID since December 3 but feels he is gradually getting weaker.  Cough is productive of white with no blood but sputum described as "black" 4 days ago.  No definite fever.  Feels congested in chest.  Denies leg pain or swelling.  Took a course of molnupiravir at original diagnosis December 3.  We discussed evaluation and options including very limited possibility of any advantage to repeating the antiviral.  06/19/22- 76 year old male former smoker(only cigars) followed for OSA, bronchitis,  Covid, BOOP ( infiltrates w high ESR- Dr [name]), complicated by DM2, CAD/CABG, Obesity, CKD, -Prednisone 30 mg daily, Zpak                   Does not have O2 CPAP 10-20 /[employer] [facility]-compliance 67%, AHI 6/ hr          Wife here Body weight today-244 lbs Covid vax-5 Moderna, 1 Phizer Flu vax-had RSV vax- had Continues Pulmonary f/u w/ Dr [name].    Wife here Download reviewed. Prefers FFM but takes it off in sleep when it "itches". Reviewed pressure settings and potential to go to BIPAP or refit mask. Right now he is comfortable and change to reduce AHI from 6 probably not medically meaningful.  He gets dyspneic moving around getting ready for bed and needs a little time to settle into his CPAP, but lowering his initial pressure won't change that. Looks just a little yellow to me in this light, but LFT ok in August. His wife credits his So-Clean machine for reduced incidence of infections. CXR 12/18/21-  IMPRESSION: Stable left basilar scarring and pleural thickening. Stable calcified pleural plaques left upper lobe. No acute abnormality is noted.   ROS-see HPI + = positive Constitutional:   No-   weight loss, night sweats, fevers, chills, fatigue, lassitude. HEENT:   No-  headaches, difficulty swallowing, tooth/dental problems, sore throat,       No-  sneezing, itching, ear ache, nasal congestion, post nasal drip,  CV:  No-   chest pain, orthopnea, PND, swelling in lower extremities, anasarca, dizziness, palpitations Resp: +shortness of breath with exertion or at rest.               +productive cough,  + non-productive cough,  No- coughing up of blood.             +change in color of mucus.  No- wheezing.   Skin: No-   rash or lesions. GI:  No-   heartburn, indigestion, abdominal pain, nausea, vomiting,  GU:  MS:  + joint pain or swelling. . Neuro-     nothing unusual Psych:  No- change in mood or affect. No depression or anxiety.  No memory loss.  OBJ General- Alert, Oriented, Affect-appropriate, Distress-  none acute, +obese Skin- rash-none, lesions- none, excoriation- none< ?? Subtle icterus? Lymphadenopathy- none Head- atraumatic            Eyes- Gross vision intact, PERRLA, conjunctivae clear secretions            Ears- Hearing aid+            Nose- Clear, no-Septal dev, mucus, polyps, erosion, perforation             Throat- Mallampati II-III , mucosa clear , drainage- none, tonsils- atrophic Neck- flexible , trachea midline, no stridor , thyroid nl, carotid no bruit Chest - symmetrical excursion , unlabored           Heart/CV- RRR , no murmur , no gallop  , no rub,  nl s1 s2                           - JVD- none , edema- none, stasis changes- none, varices- none           Lung- ?minimal basilar crackle, wheeze- none, cough- none, dullness-none, rub- none           Chest wall-  Abd- Br/ Gen/ Rectal- Not done, not indicated Extrem- + healing L TKR incision Neuro- grossly intact to observation

## 2022-06-18 NOTE — Patient Instructions (Signed)
Visit Information  Phone number for Pharmacist: [phone]   Goals Addressed   None     Care Plan : General Pharmacy (Adult)  Updates made by [name], [place] since 06/18/2022 12:00 AM     Problem: Disease mgmt   Priority: High  Note:   Current Barriers:  Orthostasis/Falls  Pharmacist Clinical Goal(s):  Patient will adhere to plan to optimize therapeutic regimen for  as evidenced by report of adherence to recommended medication management changes through collaboration with PharmD and provider.   Interventions: 1:1 collaboration with [place], [name], MD regarding development and update of comprehensive plan of care as evidenced by provider attestation and co-signature Inter-disciplinary care team collaboration (see longitudinal plan of care) Comprehensive medication review performed; medication list updated in electronic medical record  Hypertension / Heart Failure (BP goal <130/80) -Query controlled - pt reports significant orthostasis leading to passing out/falls -Last ejection fraction: 60-65% (Date: 09/2021) -HF type: Diastolic (Grade I dysfunction) -Pt weights himself daily, reports dry weight around ~235#, currently 240# -Current treatment: Furosemide 20 mg - 1/2 tab QOD - Appropriate, Effective, Query Safe Metoprolol tartrate 25 mg - 1/2 tab BID -Appropriate, Effective, Query Safe Jardiance 10 mg daily -Appropriate, Effective, Safe, Accessible -Medications previously tried: isosorbide   -Educated on BP goals and benefits of medications for prevention of heart attack, stroke and kidney damage; Symptoms of hypotension and importance of maintaining adequate hydration; -Educated on Importance of weighing daily; if you gain more than 3 pounds in one day or 5 pounds in one week, take whole tablet of furosemide -Counseled to monitor BP at home daily -Reviewed medications at length for contributions to orthostasis; discussed using furosemide PRN for wt gain/swelling; pt  may benefit from long acting metoprolol taken at bedtime -Recommend to switch metoprolol tartrate to succinate 12.5 mg daily at bedtime - consulting with cardiology  Hyperlipidemia: (LDL goal < 70) -Controlled - LDL 78 (04/2022), TRIG 325 -Hx CAD; CABG 1987, cardiac cath 09/1021 - diffuse disease, not a candidate for intervention -Current treatment: Rosuvastatin 40 mg daily - Appropriate, Effective, Safe, Accessible Ezetimibe 10 mg daily -Appropriate, Effective, Safe, Accessible Aspirin 81 mg daily -Appropriate, Effective, Safe, Accessible Nitroglycerin 0.4 mg SL prn -Appropriate, Effective, Safe, Accessible -Medications previously tried: n/a  -Current exercise habits: limited -Educated on Cholesterol goals; Benefits of statin for ASCVD risk reduction; -Recommended to continue current medication  Diabetes (A1c goal <7%) -Controlled - A1c 6.6% (04/2022); he complains of of occasional nausea, discussed this may be related to Ozempic - discussed eating smaller meals, fewer carbs/fats to prevent nausea Reviewed AGP report: 04/23/22 to 05/06/22. Sensor active: 77%  Time in range (70-180): 92% (goal > 70%)  High (>180): 8%  Low (< 70): 0% (goal < 4%)  GMI: 6.4%; Average glucose: 131 -Current medications: Jardiance 10 mg daily - Appropriate, Query Effective (GFR < 30) Novolin 70/30 - 30 units daily AM -Appropriate, Effective, Safe, Accessible Ozempic 2 mg weekly -Appropriate, Effective, Safe, Accessible Freestyle Libre 2 -Appropriate, Effective, Safe, Accessible -Medications previously tried: n/a  -Denies hypoglycemic/hyperglycemic symptoms -Educated on A1c and blood sugar goals; Prevention and management of hypoglycemic episodes; Continuous glucose monitoring; -Discussed Jardiance is probably not doing much for DM given GFR < 30, but would continue for HF/CKD benefits -Recommended to continue current medication  GERD (Goal: minimize symptoms of reflux ) -Controlled -Current treatment   Famotidine 20 mg daily HS - Appropriate, Effective, Safe, Accessible Omeprazole 40 mg daily BID -Appropriate, Effective, Safe, Accessible -Medications previously tried: none  reported  -Hx of Bleeds/ulcers: No -Counseled on small meals, elevating head, and sleeping on left side -Recommended to continue current medication  Gout (Goal: Prevent gout flares) -Controlled -Last Gout Flare: years ago - prior to allopurinol -Current treatment  Allopurinol 100 mg BID - Appropriate, Effective, Safe, Accessible Colchicine 0.6 mg PRN -Appropriate, Effective, Safe, Accessible -Medications previously tried: n/a  -We discussed:  Counseled patient on low purine diet plan. Counseled patient to reduce consumption of high-fructose corn syrup, sweetened soft drinks, fruit juices, meat, and seafood. -Recommended to continue current medication  Chronic Kidney Disease Stage 4  -All medications assessed for renal dosing and appropriateness in chronic kidney disease. -Recommended to continue current medication  BOOP (Goal: manage symptoms) -Follows with pulmonology. No indication for O2 as of 05/2022 walk test -Hx OSA, on CPAP -Current treatment  Prednisone 10 mg daily - Appropriate, Effective, Safe, Accessible Benzonatate 200 mg -Appropriate, Effective, Safe, Accessible -Medications previously tried: gabapentin  -Recommended to continue current medication  Health Maintenance -Current therapy:  Miralax Zofran PRN Glucosamine Lutein  Vitamin D3 -Patient is satisfied with current therapy and denies issues -Recommended to continue current medication  Patient Goals/Self-Care Activities Patient will:  - take medications as prescribed as evidenced by patient report and record review focus on medication adherence by routine check glucose using CGM, document, and provide at future appointments check blood pressure daily, document, and provide at future appointments weigh daily, and contact provider if  weight gain of 3+ lbs overnight, 5+ lbs in a week      Patient verbalizes understanding of instructions and care plan provided today and agrees to view in [place]. Active MyChart status and patient understanding of how to access instructions and care plan via MyChart confirmed with patient.    Telephone follow up appointment with pharmacy team member scheduled for: 1 month  [name], PharmD, [facility] Clinical Pharmacist [facility] [phone]

## 2022-06-19 ENCOUNTER — Encounter: Payer: Self-pay | Admitting: Internal Medicine

## 2022-06-19 ENCOUNTER — Ambulatory Visit: Payer: PPO | Admitting: Internal Medicine

## 2022-06-19 DIAGNOSIS — G4733 Obstructive sleep apnea (adult) (pediatric): Secondary | ICD-10-CM | POA: Diagnosis not present

## 2022-06-19 DIAGNOSIS — R918 Other nonspecific abnormal finding of lung field: Secondary | ICD-10-CM | POA: Diagnosis not present

## 2022-06-19 NOTE — Patient Instructions (Signed)
We can continue CPAP 10-20  Please call if we can help. I hope you have a god long stretch with no new problems.  Please call if we can help

## 2022-06-19 NOTE — Assessment & Plan Note (Signed)
Continues to work with Dr [name] for Pulmonary.

## 2022-06-19 NOTE — Assessment & Plan Note (Signed)
Benefits from CPAP. Discussed compliance goals. Not needing change now to BIPAP but that may be a future option. Plan- continue auto 10-20

## 2022-07-09 ENCOUNTER — Telehealth: Payer: Self-pay

## 2022-07-09 NOTE — Chronic Care Management (AMB) (Signed)
Chronic Care Management Pharmacy Assistant   Name: Kurt Aguilar  MRN: [id number] DOB: 1945-09-13  Reason for Encounter: Reminder call   Medications: Outpatient Encounter Medications as of 07/09/2022  Medication Sig   allopurinol (ZYLOPRIM) 100 MG tablet TAKE 1 TABLET BY MOUTH TWICE A DAY   aspirin EC (ASPIRIN 81) 81 MG tablet Take 81 mg by mouth daily. Swallow whole.   benzonatate (TESSALON) 200 MG capsule Take 1 capsule (200 mg total) by mouth 3 (three) times daily as needed for cough.   Calcium Carbonate-Vit D-Min (CALCIUM-VITAMIN D-MINERALS) 600-800 MG-UNIT CHEW Chew 1 tablet by mouth daily.   colchicine 0.6 MG tablet TAKE 1 TABLET BY MOUTH TWICE A DAY WITH A MEAL AS NEEDED FOR ACUTE FLARE OF GOUT   Continuous Blood Gluc Receiver (FREESTYLE LIBRE 2 READER) DEVI USE AS INSTRUCTED TO CHECK BLOOD SUGARS.   Continuous Blood Gluc Aguilar (FREESTYLE LIBRE 2 Aguilar) MISC 1 Device by Does not apply route every 14 (fourteen) days.   empagliflozin (JARDIANCE) 10 MG TABS tablet Take 1 tablet (10 mg total) by mouth daily.   ezetimibe (ZETIA) 10 MG tablet TAKE 1 TABLET BY MOUTH EVERY DAY   famotidine (PEPCID) 20 MG tablet TAKE 1 TABLET BY MOUTH EVERYDAY AT BEDTIME   furosemide (LASIX) 20 MG tablet Take 0.5 tablet (10 mg) by mouth once EVERY OTHER day   Glucosamine HCl (GLUCOSAMINE PO) Take 1,500 mg by mouth 2 (two) times daily.    glucose blood (FREESTYLE LITE) test strip 1 each by Other route 4 (four) times daily. E11.65   insulin isophane & regular human KwikPen (NOVOLIN 70/30 KWIKPEN) (70-30) 100 UNIT/ML KwikPen Inject 30 Units into the skin daily with breakfast. And pen needles 1/day (Patient taking differently: Inject 25 Units into the skin daily with breakfast. And pen needles 1/day)   Insulin Pen Needle 31G X 8 MM MISC Use as instructed to inject insulin 1x daily   isosorbide mononitrate (IMDUR) 30 MG 24 hr tablet Take 1 tablet by mouth daily.   Lancets (FREESTYLE) lancets 1 EACH BY  OTHER ROUTE 4 (FOUR) TIMES DAILY. E11.65   Lutein 20 MG CAPS Take 20 mg by mouth daily.    metoprolol succinate (TOPROL-XL) 25 MG 24 hr tablet Take 0.5 tablets (12.5 mg total) by mouth at bedtime.   nitroGLYCERIN (NITROSTAT) 0.4 MG SL tablet Place 1 tablet (0.4 mg total) under the tongue every 5 (five) minutes x 3 doses as needed for chest pain.   omeprazole (PRILOSEC) 20 MG capsule TAKE 2 CAPSULES (40 MG TOTAL) BY MOUTH DAILY BEFORE BREAKFAST.   omeprazole (PRILOSEC) 40 MG capsule TAKE 1 CAPUSLE BY MOUTH 30- 60 MIN BEFORE YOUR FIRST AND LAST MEALS OF THE DAY   ondansetron (ZOFRAN) 8 MG tablet Take 1 tablet (8 mg total) by mouth every 8 (eight) hours as needed for nausea or vomiting. From narcotic pain medicine   OZEMPIC, 2 MG/DOSE, 8 MG/3ML SOPN Inject 2 mg into the skin once a week.   polyethylene glycol (MIRALAX / GLYCOLAX) 17 g packet Take 17 g by mouth daily.   predniSONE (DELTASONE) 10 MG tablet Take prednisone 30 mg (3 tabs) for 3 days, then continue your maintenance dose of prednisone (Patient taking differently: Take 10 mg by mouth daily with breakfast. Take prednisone 30 mg (3 tabs) for 3 days, then continue your maintenance dose of prednisone 10 mg daily)   rosuvastatin (CRESTOR) 40 MG tablet Take 1 tablet (40 mg total) by mouth daily.  No facility-administered encounter medications on file as of 07/09/2022.   Kurt Aguilar was contacted to remind of upcoming telephone visit with Kurt Aguilar on 07/14/22 at 9:30. Patient was reminded to have any blood /g/lucose and blood pressure readings available for review at appointment.   Message was left reminding patient of appointment.  CCM referral has been placed prior to visit?  No   Star Rating Drugs: Medication:  Last Fill: Day Supply Jardiance '10mg'$  04/07/22 90 Insulin    02/09/22  90 Ozempic  05/09/22  28 Rosuvastatin '40mg'$  06/30/22 [place], CPP notified  Kurt Aguilar, [name]  [phone]

## 2022-07-14 ENCOUNTER — Telehealth: Payer: Self-pay | Admitting: Pharmacist

## 2022-07-14 ENCOUNTER — Ambulatory Visit: Payer: PPO

## 2022-07-14 NOTE — Progress Notes (Deleted)
Care Management Pharmacy Note  07/14/2022 Name:  Kurt Aguilar MRN:  [id number] DOB:  Feb 19, 1946  Summary: F/U visit -Pt denies further episodes of orthostasis/falls after switching metoprolol tartrate to succinate last month; overall feeling much better  Recommendations/Changes made from today's visit: -No changes  Plan: -Pharmacist follow up PRN    Subjective: Kurt Aguilar is an 76 y.o. year old male who is a primary patient of Tower, [name], MD.  The CCM team was consulted for assistance with disease management and care coordination needs.    Engaged with patient face to face for initial visit in response to provider referral for pharmacy case management and/or care coordination services.     Patient Care Team: Tower, [name], MD as PCP - General [name], Kathlene November, MD as PCP - Cardiology (Cardiology) Minna Merritts, MD as Consulting Physician (Cardiology) Tanda Rockers, MD as Consulting Physician (Pulmonary Disease) Buren Kos., MD as Referring Physician (Dentistry) [name], [facility] as Pharmacist (Pharmacist)  Recent office visits: 04/16/22 [name] OV: annual - BP trending low. Unsure if he can cut back on metoprolol - reach out to cardiology.  Recent consult visits: 06/19/22 Dr young (Pulmonology): OSA - continue BiPAP.  05/29/22 Dr [name] (Pulmonology): BOOP, chronic cough. Maintained on prednisone 10 mg.  05/06/22 Dr Cruzita Lederer (Endocrine): A1c 6.6. Move Jardiance to before breakfast.  04/21/22 [name] (Nephrology): BP 95/66. Try compression stockings. Take furosemide only PRN for significant edema.  04/18/22 cardiology pt message - decrease lasix to QOD, decrease metoprolol to 12.5 mg BID.  02/25/22 Dr [name] OV: CAD - cardiac cath 09/2021; continue to hold isosorbide; hold lasix for orthostasis, take QOD.   Hospital visits: None in previous 6 months   Objective:  Lab Results  Component Value Date   CREATININE 2.33 (H) 04/09/2022   BUN 41  (H) 04/09/2022   GFR 26.12 (L) 04/09/2022   EGFR 35 (L) 10/17/2021   GFRNONAA 28 (L) 12/18/2021   GFRAA 39 (L) 05/17/2020   NA 141 04/09/2022   K 4.0 04/09/2022   CALCIUM 9.6 04/09/2022   CO2 23 04/09/2022   GLUCOSE 131 (H) 04/09/2022    Lab Results  Component Value Date/Time   HGBA1C 6.6 (A) 05/06/2022 01:40 PM   HGBA1C 6.7 (A) 01/09/2022 03:24 PM   HGBA1C 6.6 (H) 05/17/2020 02:44 PM   HGBA1C 6.3 03/24/2019 09:25 AM   FRUCTOSAMINE 218 01/05/2017 12:02 PM   GFR 26.12 (L) 04/09/2022 08:11 AM   GFR 34.15 (L) 08/26/2021 10:51 AM   MICROALBUR 3.2 (H) 04/16/2022 11:56 AM   MICROALBUR 1.1 04/01/2019 03:34 PM    Last diabetic Eye exam:  Lab Results  Component Value Date/Time   HMDIABEYEEXA No Retinopathy 11/28/2020 12:00 AM    Last diabetic Foot exam: No results found for: "HMDIABFOOTEX"   Lab Results  Component Value Date   CHOL 169 04/09/2022   HDL 51.80 04/09/2022   LDLCALC 33 02/14/2022   LDLDIRECT 78.0 04/09/2022   TRIG 325.0 (H) 04/09/2022   CHOLHDL 3 04/09/2022       Latest Ref Rng & Units 04/09/2022    8:11 AM 11/17/2021    9:08 PM 09/17/2021   11:23 PM  Hepatic Function  Total Protein 6.0 - 8.3 g/dL 6.8  6.5  5.3   Albumin 3.5 - 5.2 g/dL 4.5  3.8  3.0   AST 0 - 37 U/L 24  44  25   ALT 0 - 53 U/L 35  51  36   Alk Phosphatase 39 - 117 U/L 51  38  33   Total Bilirubin 0.2 - 1.2 mg/dL 1.0  1.1  0.8   Bilirubin, Direct 0.0 - 0.2 mg/dL  0.2  0.2     Lab Results  Component Value Date/Time   TSH 2.25 04/09/2022 08:11 AM   TSH 3.86 04/09/2021 07:54 AM       Latest Ref Rng & Units 04/09/2022    8:11 AM 02/14/2022    9:50 AM 12/18/2021    4:35 PM  CBC  WBC 4.0 - 10.5 K/uL 12.1  12.1  9.8   Hemoglobin 13.0 - 17.0 g/dL 13.8  14.3  12.4   Hematocrit 39.0 - 52.0 % 42.9  44.0  38.4   Platelets 150.0 - 400.0 K/uL 250.0  220  218     No results found for: "VD25OH"  Clinical ASCVD: Yes  The ASCVD Risk score (Arnett DK, et al., 2019) failed to calculate for the  following reasons:   The patient has a prior MI or stroke diagnosis       04/17/2022    2:18 PM 04/16/2022   10:05 AM 04/10/2021   11:24 AM  Depression screen PHQ 2/9  Decreased Interest 0 0 0  Down, Depressed, Hopeless 0 0 0  PHQ - 2 Score 0 0 0  Altered sleeping 0  0  Tired, decreased energy 0  0  Change in appetite 0  0  Feeling bad or failure about yourself  0  0  Trouble concentrating 0  0  Moving slowly or fidgety/restless 0  0  Suicidal thoughts 0  0  PHQ-9 Score 0  0  Difficult doing work/chores Not difficult at all  Not difficult at all    Social History   Tobacco Use  Smoking Status Former   Packs/day: 1.00   Years: 3.00   Total pack years: 3.00   Types: Cigars, Cigarettes   Quit date: 11/25/2004   Years since quitting: 17.6  Smokeless Tobacco Never  Tobacco Comments   occas. cigar quit 2006   BP Readings from Last 3 Encounters:  06/19/22 126/80  05/29/22 130/80  05/06/22 120/88   Pulse Readings from Last 3 Encounters:  06/19/22 83  05/29/22 82  05/06/22 90   Wt Readings from Last 3 Encounters:  06/19/22 244 lb (110.7 kg)  05/29/22 239 lb 9.6 oz (108.7 kg)  05/06/22 239 lb (108.4 kg)   BMI Readings from Last 3 Encounters:  06/19/22 34.76 kg/m  05/29/22 34.13 kg/m  05/06/22 33.84 kg/m    Assessment/Interventions: Review of patient past medical history, allergies, medications, health status, including review of consultants reports, laboratory and other test data, was performed as part of comprehensive evaluation and provision of chronic care management services.   SDOH:  (Social Determinants of Health) assessments and interventions performed: No SDOH Interventions    Flowsheet Row Clinical Support from 04/17/2022 in [place] at [place] from 04/10/2021 in [place] at [place] from 04/09/2020 in [place] at [place] Interventions  Intervention Not Indicated -- --  Housing Interventions Intervention Not Indicated -- --  Transportation Interventions Intervention Not Indicated -- --  Depression Interventions/Treatment  -- QHU7-6 Score <4 Follow-up Not Indicated PHQ2-9 Score <4 Follow-up Not Indicated  Financial Strain Interventions Intervention Not Indicated -- --  Physical Activity Interventions Intervention Not Indicated -- --  Stress Interventions Intervention Not Indicated -- --  Social Connections Interventions Intervention Not Indicated -- --      SDOH Screenings   Food Insecurity: No Food Insecurity (04/17/2022)  Housing: Low Risk  (04/17/2022)  Transportation Needs: No Transportation Needs (04/17/2022)  Alcohol Screen: Low Risk  (04/17/2022)  Depression (PHQ2-9): Low Risk  (04/17/2022)  Financial Resource Strain: Low Risk  (04/17/2022)  Physical Activity: Insufficiently Active (04/17/2022)  Social Connections: Moderately Integrated (04/17/2022)  Stress: No Stress Concern Present (04/17/2022)  Tobacco Use: Medium Risk (06/19/2022)    CCM Care Plan  Allergies  Allergen Reactions   Oxycodone-Acetaminophen Other (See Comments)    Stops breathing; has tolerated Tylenol before   Oxycodone-Acetaminophen Anaphylaxis    Medications Reviewed Today     Reviewed by [name], [facility] (Pharmacist) on 07/14/22 at 0958  Med List Status: <None>   Medication Order Taking? Sig Documenting Provider Last Dose Status Informant  allopurinol (ZYLOPRIM) 100 MG tablet [id number] Yes TAKE 1 TABLET BY MOUTH TWICE A DAY Tower, Marne A, MD Taking Active   aspirin EC (ASPIRIN 81) 81 MG tablet [id number] Yes Take 81 mg by mouth daily. Swallow whole. [provider] Taking Active   benzonatate (TESSALON) 200 MG capsule [id number] Yes Take 1 capsule (200 mg total) by mouth 3 (three) times daily as needed for cough. Mendel Corning, MD Taking Active   Calcium Carbonate-Vit D-Min (CALCIUM-VITAMIN D-MINERALS) 600-800 MG-UNIT  CHEW [id number] Yes Chew 1 tablet by mouth daily. [provider] Taking Active   colchicine 0.6 MG tablet [id number] Yes TAKE 1 TABLET BY MOUTH TWICE A DAY WITH A MEAL AS NEEDED FOR ACUTE FLARE OF GOUT Tower, [name], MD Taking Active Spouse/Significant Other  Continuous Blood Gluc Receiver (FREESTYLE LIBRE 2 READER) [place] [id number] Yes USE AS INSTRUCTED TO CHECK BLOOD SUGARS. Renato Shin, MD Taking Active   Continuous Blood Gluc Sensor (FREESTYLE LIBRE 2 SENSOR) [place] [id number] Yes 1 Device by Does not apply route every 14 (fourteen) days. Renato Shin, MD Taking Active   empagliflozin (JARDIANCE) 10 MG TABS tablet [id number] Yes Take 1 tablet (10 mg total) by mouth daily. Philemon Kingdom, MD Taking Active   ezetimibe (ZETIA) 10 MG tablet [id number] Yes TAKE 1 TABLET BY MOUTH EVERY DAY Tower, [name], MD Taking Active   famotidine (PEPCID) 20 MG tablet [id number] Yes TAKE 1 TABLET BY MOUTH EVERYDAY AT BEDTIME Tanda Rockers, MD Taking Active   furosemide (LASIX) 20 MG tablet [id number] Yes Take 0.5 tablet (10 mg) by mouth once EVERY OTHER day Minna Merritts, MD Taking Active   Glucosamine HCl (GLUCOSAMINE PO) [id number] Yes Take 1,500 mg by mouth 2 (two) times daily.  [provider] Taking Active Spouse/Significant Other  glucose blood (FREESTYLE LITE) test strip [id number] Yes 1 each by Other route 4 (four) times daily. E11.65 Renato Shin, MD Taking Active Spouse/Significant Other  insulin isophane & regular human KwikPen (NOVOLIN 70/30 KWIKPEN) (70-30) 100 UNIT/ML KwikPen [id number] Yes Inject 30 Units into the skin daily with breakfast. And pen needles 1/day  Patient taking differently: Inject 25 Units into the skin daily with breakfast. And pen needles 1/day   Renato Shin, MD Taking Active   Insulin Pen Needle 31G X 8 MM MISC [id number] Yes Use as instructed to inject insulin 1x daily Philemon Kingdom, MD Taking Active   isosorbide mononitrate (IMDUR) 30 MG 24 hr tablet  [id number] Yes Take 1 tablet by mouth daily. [provider] Taking Active   Lancets (FREESTYLE) lancets [id number] Yes 1 EACH BY OTHER ROUTE 4 (  FOUR) TIMES DAILY. E11.65 Renato Shin, MD Taking Active Spouse/Significant Other  Lutein 20 MG CAPS [id number] Yes Take 20 mg by mouth daily.  [provider] Taking Active Spouse/Significant Other  metoprolol succinate (TOPROL-XL) 25 MG 24 hr tablet [id number] Yes Take 0.5 tablets (12.5 mg total) by mouth at bedtime. Minna Merritts, MD Taking Active   nitroGLYCERIN (NITROSTAT) 0.4 MG SL tablet [id number] Yes Place 1 tablet (0.4 mg total) under the tongue every 5 (five) minutes x 3 doses as needed for chest pain. Rai, Vernelle Emerald, MD Taking Active   omeprazole (PRILOSEC) 20 MG capsule [id number] Yes TAKE 2 CAPSULES (40 MG TOTAL) BY MOUTH DAILY BEFORE BREAKFAST. Tanda Rockers, MD Taking Active   omeprazole (PRILOSEC) 40 MG capsule [id number] Yes TAKE 1 CAPUSLE BY MOUTH 30- 60 MIN BEFORE YOUR FIRST AND LAST MEALS OF THE DAY Tanda Rockers, MD Taking Active Spouse/Significant Other  ondansetron (ZOFRAN) 8 MG tablet [id number] Yes Take 1 tablet (8 mg total) by mouth every 8 (eight) hours as needed for nausea or vomiting. From narcotic pain medicine Tower, [name], MD Taking Active Spouse/Significant Other  OZEMPIC, 2 MG/DOSE, 8 MG/3ML SOPN [id number] Yes Inject 2 mg into the skin once a week. [provider] Taking Active   polyethylene glycol (MIRALAX / GLYCOLAX) 17 g packet [id number] Yes Take 17 g by mouth daily. [provider] Taking Active   predniSONE (DELTASONE) 10 MG tablet [id number] Yes Take prednisone 30 mg (3 tabs) for 3 days, then continue your maintenance dose of prednisone  Patient taking differently: Take 10 mg by mouth daily with breakfast.   Rai, Ripudeep K, MD Taking Active   rosuvastatin (CRESTOR) 40 MG tablet [id number] Yes Take 1 tablet (40 mg total) by mouth daily. Tower, [name], MD Taking Active   Med List  Note Annamaria Boots, [place] D, MD 10/05/11 1705): CPAP 11/ [facility]            Patient Active Problem List   Diagnosis Date Noted   Lumbar pain 01/30/2022   Chronic diastolic heart failure ([place]) 10/23/2021   AKI (acute kidney injury) ([place]) 09/17/2021   Stage 3b chronic kidney disease (CKD) ([place]) 09/17/2021   Acute coronary syndrome ([place]) 09/17/2021   Osteopenia 05/16/2021   Steroid dependent ([place]) 04/12/2021   Primary osteoarthritis of left knee 05/29/2020   Colon cancer screening 02/13/2017   Poorly controlled type 2 diabetes mellitus with circulatory disorder ([place]) 08/13/2016   Chronic heel pain, left 08/12/2016   Upper airway cough syndrome 07/31/2016   Class 1 obesity with serious comorbidity and body mass index (BMI) of 32.0 to 32.9 in adult 05/19/2016   Routine general medical examination at a health care facility 01/27/2016   Pulmonary infiltrates with high  ESR c/w BOOP/ idiopathic  12/19/2015   Cough 11/29/2015   Cystic kidney disease 10/01/2015   Renal lesion 09/27/2015   Erectile dysfunction of organic origin 09/27/2015   BPH with obstruction/lower urinary tract symptoms 09/27/2015   Constipation 09/24/2015   Cyst of right kidney 09/24/2015   CAD (coronary artery disease) 06/08/2015   Grieving 11/22/2014   Encounter for Medicare annual wellness exam 07/11/2013   Prostate cancer screening 07/03/2013   Right bundle branch block 02/22/2013   Chronic low back pain 05/13/2011   Obstructive sleep apnea 03/15/2010   Hyperlipidemia associated with type 2 diabetes mellitus ([place]) 04/12/2009   Gout 04/12/2009   Essential hypertension 04/12/2009   Coronary artery disease of bypass graft of native heart with stable  angina pectoris ([place]) 04/12/2009   GERD 04/12/2009   Renal insufficiency 04/12/2009   DIVERTICULITIS, HX OF 04/12/2009    Immunization History  Administered Date(s) Administered   Fluad Quad(high Dose 65+) 06/25/2019, 05/29/2022   Influenza Split 07/09/2011    Influenza Whole 05/14/2010, 06/09/2012   Influenza, High Dose Seasonal PF 05/22/2014, 07/04/2015, 05/16/2016, 07/09/2018   Influenza,inj,Quad PF,6+ Mos 07/11/2013   Influenza-Unspecified 07/03/2017, 06/14/2021   Moderna Sars-Covid-2 Vaccination 11/18/2019, 12/27/2019, 07/24/2020, 04/12/2021, 06/14/2021   Pfizer Covid-19 Vaccine Bivalent Booster 30yr & up 06/16/2022   Pneumococcal Conjugate-13 08/07/2014   Pneumococcal Polysaccharide-23 09/08/2002, 07/11/2013   Respiratory Syncytial Virus Vaccine,Recomb Aduvanted(Arexvy) 06/16/2022   Td 09/08/2004, 02/12/2016   Zoster Recombinat (Shingrix) 04/06/2018, 06/17/2018   Zoster, Live 07/09/2011    Conditions to be addressed/monitored:  Hypertension, Hyperlipidemia, Diabetes, Heart Failure, and Coronary Artery Disease  Care Plan : General Pharmacy (Adult)  Updates made by FCharlton Haws RArgylesince 07/14/2022 12:00 AM     Problem: Disease mgmt Resolved 07/14/2022  Priority: High  Note:   Current Barriers:  None identified  Pharmacist Clinical Goal(s):  Patient will contact provider office for questions/concerns as evidenced notation of same in electronic health record through collaboration with PharmD and provider.   Interventions: 1:1 collaboration with Tower, MWynelle Fanny MD regarding development and update of comprehensive plan of care as evidenced by provider attestation and co-signature Inter-disciplinary care team collaboration (see longitudinal plan of care) Comprehensive medication review performed; medication list updated in electronic medical record  Hypertension / Heart Failure (BP goal <130/80) -Controlled - pt reports he has had no further falls/orthostatic events since switching metoprolol tartrate to succinate last month; he denies elevated HR/palpitations -Last ejection fraction: 60-65% (Date: 09/2021) -HF type: Diastolic (Grade I dysfunction) -Pt weights himself daily, reports dry weight around ~235# -Current  treatment: Furosemide 20 mg - 1/2 tab QOD - Appropriate, Effective, Safe, Accessible Metoprolol succinate 25 mg - 1/2 tab daily -Appropriate, Effective, Safe, Accessible Jardiance 10 mg daily -Appropriate, Effective, Safe, Accessible -Medications previously tried: isosorbide   -Educated on BP goals and benefits of medications for prevention of heart attack, stroke and kidney damage; Symptoms of hypotension and importance of maintaining adequate hydration; -Educated on Importance of weighing daily; if you gain more than 3 pounds in one day or 5 pounds in one week, take whole tablet of furosemide -Counseled to monitor BP/HR at home daily -Recommend to continue current medication  Hyperlipidemia: (LDL goal < 70) -Controlled - LDL 78 (04/2022), TRIG 325 -Hx CAD; CABG 1987, cardiac cath 09/1021 - diffuse disease, not a candidate for intervention -Current treatment: Rosuvastatin 40 mg daily - Appropriate, Effective, Safe, Accessible Ezetimibe 10 mg daily -Appropriate, Effective, Safe, Accessible Aspirin 81 mg daily -Appropriate, Effective, Safe, Accessible Nitroglycerin 0.4 mg SL prn -Appropriate, Effective, Safe, Accessible -Medications previously tried: n/a  -Current exercise habits: limited -Educated on Cholesterol goals; Benefits of statin for ASCVD risk reduction; -Recommended to continue current medication  Diabetes (A1c goal <7%) -Controlled - A1c 6.6% (04/2022); he complains of of occasional nausea, discussed this may be related to Ozempic - discussed eating smaller meals, fewer carbs/fats to prevent nausea Reviewed AGP report: 04/23/22 to 05/06/22. Sensor active: 77%  Time in range (70-180): 92% (goal > 70%)  High (>180): 8%  Low (< 70): 0% (goal < 4%)  GMI: 6.4%; Average glucose: 131 -Current medications: Jardiance 10 mg daily - Appropriate, Query Effective (GFR < 30) Novolin 70/30 - 30 units daily AM -Appropriate, Effective, Safe, Accessible Ozempic 2 mg  weekly -Appropriate,  Effective, Safe, Accessible Freestyle Libre 2 -Appropriate, Effective, Safe, Accessible -Medications previously tried: n/a  -Denies hypoglycemic/hyperglycemic symptoms -Educated on A1c and blood sugar goals; Prevention and management of hypoglycemic episodes; Continuous glucose monitoring; -Discussed Jardiance is probably not doing much for DM given GFR < 30, but would continue for HF/CKD benefits -Recommended to continue current medication  GERD (Goal: minimize symptoms of reflux ) -Controlled -Current treatment  Famotidine 20 mg daily HS - Appropriate, Effective, Safe, Accessible Omeprazole 40 mg daily BID -Appropriate, Effective, Safe, Accessible -Medications previously tried: none reported  -Hx of Bleeds/ulcers: No -Counseled on small meals, elevating head, and sleeping on left side -Recommended to continue current medication  Gout (Goal: Prevent gout flares) -Controlled -Last Gout Flare: years ago - prior to allopurinol -Current treatment  Allopurinol 100 mg BID - Appropriate, Effective, Safe, Accessible Colchicine 0.6 mg PRN -Appropriate, Effective, Safe, Accessible -Medications previously tried: n/a  -We discussed:  Counseled patient on low purine diet plan. Counseled patient to reduce consumption of high-fructose corn syrup, sweetened soft drinks, fruit juices, meat, and seafood. -Recommended to continue current medication  Chronic Kidney Disease Stage 4  -All medications assessed for renal dosing and appropriateness in chronic kidney disease. -Recommended to continue current medication  BOOP (Goal: manage symptoms) -Follows with pulmonology. No indication for O2 as of 05/2022 walk test -Hx OSA, on CPAP -Current treatment  Prednisone 10 mg daily - Appropriate, Effective, Safe, Accessible Benzonatate 200 mg -Appropriate, Effective, Safe, Accessible -Medications previously tried: gabapentin  -Recommended to continue current medication  Health Maintenance -Current  therapy:  Miralax Zofran PRN Glucosamine Lutein  Vitamin D3 -Patient is satisfied with current therapy and denies issues -Recommended to continue current medication  Patient Goals/Self-Care Activities Patient will:  - take medications as prescribed as evidenced by patient report and record review focus on medication adherence by routine check glucose using CGM, document, and provide at future appointments check blood pressure daily, document, and provide at future appointments weigh daily, and contact provider if weight gain of 3+ lbs overnight, 5+ lbs in a week      Medication Assistance: None required.  Patient affirms current coverage meets needs.  Compliance/Adherence/Medication fill history: Care Gaps: None  Star-Rating Drugs: Jardiance - PDC 98% Rosuvastatin - PDC 96%  Medication Access: Within the past 30 days, how often has patient missed a dose of medication? 0 Is a pillbox or other method used to improve adherence? Yes  Factors that may affect medication adherence? no barriers identified Are meds synced by current pharmacy? No  Are meds delivered by current pharmacy? No  Does patient experience delays in picking up medications due to transportation concerns? No   [facility] Reviewed: Is patient disadvantaged to use [facility]?: No  Current Rx insurance plan: HTA Name and location of Current pharmacy:  [id number] [place] [place]Phone: [phone]: [phone] [facility] services reviewed with patient today?: No  Patient requests to transfer care to [facility]?: No  Reason patient declined to change pharmacies: Not mentioned at this visit   Care Plan and Follow Up Patient Decision:  Patient agrees to Care Plan and Follow-up.  Plan: Telephone follow up appointment with care management team member scheduled for:  1 month  LCharlene Brooke PharmD,  BThe Surgicare Center Of UtahClinical Pharmacist LSteep FallsPrimary Care at SFayetteville Ar [phone]

## 2022-07-14 NOTE — Patient Instructions (Signed)
Visit Information  Phone number for Pharmacist: [phone]   Goals Addressed   None     Care Plan : Kurt Aguilar (Adult)  Updates made by [name], [name] since 07/14/2022 12:00 AM     Problem: Disease mgmt Resolved 07/14/2022  Priority: High  Note:   Current Barriers:  None identified  Pharmacist Clinical Goal(s):  Patient will contact provider office for questions/concerns as evidenced notation of same in electronic health record through collaboration with PharmD and provider.   Interventions: 1:1 collaboration with Tower, [name], MD regarding development and update of comprehensive plan of care as evidenced by provider attestation and co-signature Inter-disciplinary care team collaboration (see longitudinal plan of care) Comprehensive medication review performed; medication list updated in electronic medical record  Hypertension / Heart Failure (BP goal <130/80) -Controlled - pt reports he has had no further falls/orthostatic events since switching metoprolol tartrate to succinate last month; he denies elevated HR/palpitations -Last ejection fraction: 60-65% (Date: 09/2021) -HF type: Diastolic (Grade I dysfunction) -Pt weights himself daily, reports dry weight around ~235# -Current treatment: Furosemide 20 mg - 1/2 tab QOD - Appropriate, Effective, Safe, Accessible Metoprolol succinate 25 mg - 1/2 tab daily -Appropriate, Effective, Safe, Accessible Jardiance 10 mg daily -Appropriate, Effective, Safe, Accessible -Medications previously tried: isosorbide   -Educated on BP goals and benefits of medications for prevention of heart attack, stroke and kidney damage; Symptoms of hypotension and importance of maintaining adequate hydration; -Educated on Importance of weighing daily; if you gain more than 3 pounds in one day or 5 pounds in one week, take whole tablet of furosemide -Counseled to monitor BP/HR at home daily -Recommend to continue current  medication  Hyperlipidemia: (LDL goal < 70) -Controlled - LDL 78 (04/2022), TRIG 325 -Hx CAD; CABG 1987, cardiac cath 09/1021 - diffuse disease, not a candidate for intervention -Current treatment: Rosuvastatin 40 mg daily - Appropriate, Effective, Safe, Accessible Ezetimibe 10 mg daily -Appropriate, Effective, Safe, Accessible Aspirin 81 mg daily -Appropriate, Effective, Safe, Accessible Nitroglycerin 0.4 mg SL prn -Appropriate, Effective, Safe, Accessible -Medications previously tried: n/a  -Current exercise habits: limited -Educated on Cholesterol goals; Benefits of statin for ASCVD risk reduction; -Recommended to continue current medication  Diabetes (A1c goal <7%) -Controlled - A1c 6.6% (04/2022); he complains of of occasional nausea, discussed this may be related to Ozempic - discussed eating smaller meals, fewer carbs/fats to prevent nausea Reviewed AGP report: 04/23/22 to 05/06/22. Sensor active: 77%  Time in range (70-180): 92% (goal > 70%)  High (>180): 8%  Low (< 70): 0% (goal < 4%)  GMI: 6.4%; Average glucose: 131 -Current medications: Jardiance 10 mg daily - Appropriate, Query Effective (GFR < 30) Novolin 70/30 - 30 units daily AM -Appropriate, Effective, Safe, Accessible Ozempic 2 mg weekly -Appropriate, Effective, Safe, Accessible Freestyle Libre 2 -Appropriate, Effective, Safe, Accessible -Medications previously tried: n/a  -Denies hypoglycemic/hyperglycemic symptoms -Educated on A1c and blood sugar goals; Prevention and management of hypoglycemic episodes; Continuous glucose monitoring; -Discussed Jardiance is probably not doing much for DM given GFR < 30, but would continue for HF/CKD benefits -Recommended to continue current medication  GERD (Goal: minimize symptoms of reflux ) -Controlled -Current treatment  Famotidine 20 mg daily HS - Appropriate, Effective, Safe, Accessible Omeprazole 40 mg daily BID -Appropriate, Effective, Safe, Accessible -Medications  previously tried: none reported  -Hx of Bleeds/ulcers: No -Counseled on small meals, elevating head, and sleeping on left side -Recommended to continue current medication  Gout (Goal: Prevent gout flares) -Controlled -Last  Gout Flare: years ago - prior to allopurinol -Current treatment  Allopurinol 100 mg BID - Appropriate, Effective, Safe, Accessible Colchicine 0.6 mg PRN -Appropriate, Effective, Safe, Accessible -Medications previously tried: n/a  -We discussed:  Counseled patient on low purine diet plan. Counseled patient to reduce consumption of high-fructose corn syrup, sweetened soft drinks, fruit juices, meat, and seafood. -Recommended to continue current medication  Chronic Kidney Disease Stage 4  -All medications assessed for renal dosing and appropriateness in chronic kidney disease. -Recommended to continue current medication  BOOP (Goal: manage symptoms) -Follows with pulmonology. No indication for O2 as of 05/2022 walk test -Hx OSA, on CPAP -Current treatment  Prednisone 10 mg daily - Appropriate, Effective, Safe, Accessible Benzonatate 200 mg -Appropriate, Effective, Safe, Accessible -Medications previously tried: gabapentin  -Recommended to continue current medication  Health Maintenance -Current therapy:  Miralax Zofran PRN Glucosamine Lutein  Vitamin D3 -Patient is satisfied with current therapy and denies issues -Recommended to continue current medication  Patient Goals/Self-Care Activities Patient will:  - take medications as prescribed as evidenced by patient report and record review focus on medication adherence by routine check glucose using CGM, document, and provide at future appointments check blood pressure daily, document, and provide at future appointments weigh daily, and contact provider if weight gain of 3+ lbs overnight, 5+ lbs in a week      Patient verbalizes understanding of instructions and care plan provided today and agrees to view  in [name]. Active MyChart status and patient understanding of how to access instructions and care plan via MyChart confirmed with patient.    Telephone follow up appointment with pharmacy team member scheduled for: PRN  [name], PharmD, BCACP Clinical Pharmacist [facility] [phone]

## 2022-07-14 NOTE — Telephone Encounter (Signed)
Care Management Pharmacy Note  07/14/2022 Name:  Kurt Aguilar MRN:  [id number] DOB:  [birth year]  Summary: F/U visit -Pt denies further episodes of orthostasis/falls after switching metoprolol tartrate to succinate last month; overall feeling much better  Recommendations/Changes made from today's visit: -No changes  Plan: -Pharmacist follow up PRN    Subjective: Kurt Aguilar is an 76 y.o. year old male who is a primary patient of Tower, [name], MD.  The CCM team was consulted for assistance with disease management and care coordination needs.    Engaged with patient face to face for initial visit in response to provider referral for pharmacy case management and/or care coordination services.     Patient Care Team: Tower, [name], MD as PCP - General [name], Kathlene November, MD as PCP - Cardiology (Cardiology) Minna Merritts, MD as Consulting Physician (Cardiology) Tanda Rockers, MD as Consulting Physician (Pulmonary Disease) Buren Kos., MD as Referring Physician (Dentistry) [name], [facility] as Pharmacist (Pharmacist)  Recent office visits: 04/16/22 [name] OV: annual - BP trending low. Unsure if he can cut back on metoprolol - reach out to cardiology.  Recent consult visits: 06/19/22 Dr young (Pulmonology): OSA - continue BiPAP.  05/29/22 Dr [name] (Pulmonology): BOOP, chronic cough. Maintained on prednisone 10 mg.  05/06/22 Dr Cruzita Lederer (Endocrine): A1c 6.6. Move Jardiance to before breakfast.  04/21/22 [name] (Nephrology): BP 95/66. Try compression stockings. Take furosemide only PRN for significant edema.  04/18/22 cardiology pt message - decrease lasix to QOD, decrease metoprolol to 12.5 mg BID.  02/25/22 Dr [name] OV: CAD - cardiac cath 09/2021; continue to hold isosorbide; hold lasix for orthostasis, take QOD.   Hospital visits: None in previous 6 months   Objective:  Lab Results  Component Value Date   CREATININE 2.33 (H) 04/09/2022   BUN 41  (H) 04/09/2022   GFR 26.12 (L) 04/09/2022   EGFR 35 (L) 10/17/2021   GFRNONAA 28 (L) 12/18/2021   GFRAA 39 (L) 05/17/2020   NA 141 04/09/2022   K 4.0 04/09/2022   CALCIUM 9.6 04/09/2022   CO2 23 04/09/2022   GLUCOSE 131 (H) 04/09/2022    Lab Results  Component Value Date/Time   HGBA1C 6.6 (A) 05/06/2022 01:40 PM   HGBA1C 6.7 (A) 01/09/2022 03:24 PM   HGBA1C 6.6 (H) 05/17/2020 02:44 PM   HGBA1C 6.3 03/24/2019 09:25 AM   FRUCTOSAMINE 218 01/05/2017 12:02 PM   GFR 26.12 (L) 04/09/2022 08:11 AM   GFR 34.15 (L) 08/26/2021 10:51 AM   MICROALBUR 3.2 (H) 04/16/2022 11:56 AM   MICROALBUR 1.1 04/01/2019 03:34 PM    Last diabetic Eye exam:  Lab Results  Component Value Date/Time   HMDIABEYEEXA No Retinopathy 11/28/2020 12:00 AM    Last diabetic Foot exam: No results found for: "HMDIABFOOTEX"   Lab Results  Component Value Date   CHOL 169 04/09/2022   HDL 51.80 04/09/2022   LDLCALC 33 02/14/2022   LDLDIRECT 78.0 04/09/2022   TRIG 325.0 (H) 04/09/2022   CHOLHDL 3 04/09/2022       Latest Ref Rng & Units 04/09/2022    8:11 AM 11/17/2021    9:08 PM 09/17/2021   11:23 PM  Hepatic Function  Total Protein 6.0 - 8.3 g/dL 6.8  6.5  5.3   Albumin 3.5 - 5.2 g/dL 4.5  3.8  3.0   AST 0 - 37 U/L 24  44  25   ALT 0 - 53 U/L 35  51  36   Alk Phosphatase 39 - 117 U/L 51  38  33   Total Bilirubin 0.2 - 1.2 mg/dL 1.0  1.1  0.8   Bilirubin, Direct 0.0 - 0.2 mg/dL  0.2  0.2     Lab Results  Component Value Date/Time   TSH 2.25 04/09/2022 08:11 AM   TSH 3.86 04/09/2021 07:54 AM       Latest Ref Rng & Units 04/09/2022    8:11 AM 02/14/2022    9:50 AM 12/18/2021    4:35 PM  CBC  WBC 4.0 - 10.5 K/uL 12.1  12.1  9.8   Hemoglobin 13.0 - 17.0 g/dL 13.8  14.3  12.4   Hematocrit 39.0 - 52.0 % 42.9  44.0  38.4   Platelets 150.0 - 400.0 K/uL 250.0  220  218     No results found for: "VD25OH"  Clinical ASCVD: Yes  The ASCVD Risk score (Arnett DK, et al., 2019) failed to calculate for the  following reasons:   The patient has a prior MI or stroke diagnosis       04/17/2022    2:18 PM 04/16/2022   10:05 AM 04/10/2021   11:24 AM  Depression screen PHQ 2/9  Decreased Interest 0 0 0  Down, Depressed, Hopeless 0 0 0  PHQ - 2 Score 0 0 0  Altered sleeping 0  0  Tired, decreased energy 0  0  Change in appetite 0  0  Feeling bad or failure about yourself  0  0  Trouble concentrating 0  0  Moving slowly or fidgety/restless 0  0  Suicidal thoughts 0  0  PHQ-9 Score 0  0  Difficult doing work/chores Not difficult at all  Not difficult at all    Social History   Tobacco Use  Smoking Status Former   Packs/day: 1.00   Years: 3.00   Total pack years: 3.00   Types: Cigars, Cigarettes   Quit date: 11/25/2004   Years since quitting: 17.6  Smokeless Tobacco Never  Tobacco Comments   occas. cigar quit 2006   BP Readings from Last 3 Encounters:  06/19/22 126/80  05/29/22 130/80  05/06/22 120/88   Pulse Readings from Last 3 Encounters:  06/19/22 83  05/29/22 82  05/06/22 90   Wt Readings from Last 3 Encounters:  06/19/22 244 lb (110.7 kg)  05/29/22 239 lb 9.6 oz (108.7 kg)  05/06/22 239 lb (108.4 kg)   BMI Readings from Last 3 Encounters:  06/19/22 34.76 kg/m  05/29/22 34.13 kg/m  05/06/22 33.84 kg/m    Assessment/Interventions: Review of patient past medical history, allergies, medications, health status, including review of consultants reports, laboratory and other test data, was performed as part of comprehensive evaluation and provision of chronic care management services.   SDOH:  (Social Determinants of Health) assessments and interventions performed: No SDOH Interventions    Flowsheet Row Clinical Support from 04/17/2022 in [place] at [place] from 04/10/2021 in [place] at [place] from 04/09/2020 in [place] at [place] Interventions  Intervention Not Indicated -- --  Housing Interventions Intervention Not Indicated -- --  Transportation Interventions Intervention Not Indicated -- --  Depression Interventions/Treatment  -- JAS5-0 Score <4 Follow-up Not Indicated PHQ2-9 Score <4 Follow-up Not Indicated  Financial Strain Interventions Intervention Not Indicated -- --  Physical Activity Interventions Intervention Not Indicated -- --  Stress Interventions Intervention Not Indicated -- --  Social Connections Interventions Intervention Not Indicated -- --      SDOH Screenings   Food Insecurity: No Food Insecurity (04/17/2022)  Housing: Low Risk  (04/17/2022)  Transportation Needs: No Transportation Needs (04/17/2022)  Alcohol Screen: Low Risk  (04/17/2022)  Depression (PHQ2-9): Low Risk  (04/17/2022)  Financial Resource Strain: Low Risk  (04/17/2022)  Physical Activity: Insufficiently Active (04/17/2022)  Social Connections: Moderately Integrated (04/17/2022)  Stress: No Stress Concern Present (04/17/2022)  Tobacco Use: Medium Risk (06/19/2022)    CCM Care Plan  Allergies  Allergen Reactions   Oxycodone-Acetaminophen Other (See Comments)    Stops breathing; has tolerated Tylenol before   Oxycodone-Acetaminophen Anaphylaxis    Medications Reviewed Today     Reviewed by [name], [facility] (Pharmacist) on 07/14/22 at 0958  Med List Status: <None>   Medication Order Taking? Sig Documenting Provider Last Dose Status Informant  allopurinol (ZYLOPRIM) 100 MG tablet [id number] Yes TAKE 1 TABLET BY MOUTH TWICE A DAY Tower, Marne A, MD Taking Active   aspirin EC (ASPIRIN 81) 81 MG tablet [id number] Yes Take 81 mg by mouth daily. Swallow whole. [provider] Taking Active   benzonatate (TESSALON) 200 MG capsule [id number] Yes Take 1 capsule (200 mg total) by mouth 3 (three) times daily as needed for cough. Mendel Corning, MD Taking Active   Calcium Carbonate-Vit D-Min (CALCIUM-VITAMIN D-MINERALS) 600-800 MG-UNIT  CHEW [id number] Yes Chew 1 tablet by mouth daily. [provider] Taking Active   colchicine 0.6 MG tablet [id number] Yes TAKE 1 TABLET BY MOUTH TWICE A DAY WITH A MEAL AS NEEDED FOR ACUTE FLARE OF GOUT Tower, [name], MD Taking Active Spouse/Significant Other  Continuous Blood Gluc Receiver (FREESTYLE LIBRE 2 READER) [place] [id number] Yes USE AS INSTRUCTED TO CHECK BLOOD SUGARS. Renato Shin, MD Taking Active   Continuous Blood Gluc Sensor (FREESTYLE LIBRE 2 SENSOR) [place] [id number] Yes 1 Device by Does not apply route every 14 (fourteen) days. Renato Shin, MD Taking Active   empagliflozin (JARDIANCE) 10 MG TABS tablet [id number] Yes Take 1 tablet (10 mg total) by mouth daily. Philemon Kingdom, MD Taking Active   ezetimibe (ZETIA) 10 MG tablet [id number] Yes TAKE 1 TABLET BY MOUTH EVERY DAY Tower, [name], MD Taking Active   famotidine (PEPCID) 20 MG tablet [id number] Yes TAKE 1 TABLET BY MOUTH EVERYDAY AT BEDTIME Tanda Rockers, MD Taking Active   furosemide (LASIX) 20 MG tablet [id number] Yes Take 0.5 tablet (10 mg) by mouth once EVERY OTHER day Minna Merritts, MD Taking Active   Glucosamine HCl (GLUCOSAMINE PO) [id number] Yes Take 1,500 mg by mouth 2 (two) times daily.  [provider] Taking Active Spouse/Significant Other  glucose blood (FREESTYLE LITE) test strip [id number] Yes 1 each by Other route 4 (four) times daily. E11.65 Renato Shin, MD Taking Active Spouse/Significant Other  insulin isophane & regular human KwikPen (NOVOLIN 70/30 KWIKPEN) (70-30) 100 UNIT/ML KwikPen [id number] Yes Inject 30 Units into the skin daily with breakfast. And pen needles 1/day  Patient taking differently: Inject 25 Units into the skin daily with breakfast. And pen needles 1/day   Renato Shin, MD Taking Active   Insulin Pen Needle 31G X 8 MM MISC [id number] Yes Use as instructed to inject insulin 1x daily Philemon Kingdom, MD Taking Active   isosorbide mononitrate (IMDUR) 30 MG 24 hr tablet  [id number] Yes Take 1 tablet by mouth daily. [provider] Taking Active   Lancets (FREESTYLE) lancets [id number] Yes 1 EACH BY OTHER ROUTE 4 (  FOUR) TIMES DAILY. E11.65 Renato Shin, MD Taking Active Spouse/Significant Other  Lutein 20 MG CAPS [id number] Yes Take 20 mg by mouth daily.  [provider] Taking Active Spouse/Significant Other  metoprolol succinate (TOPROL-XL) 25 MG 24 hr tablet [id number] Yes Take 0.5 tablets (12.5 mg total) by mouth at bedtime. Minna Merritts, MD Taking Active   nitroGLYCERIN (NITROSTAT) 0.4 MG SL tablet [id number] Yes Place 1 tablet (0.4 mg total) under the tongue every 5 (five) minutes x 3 doses as needed for chest pain. Rai, Vernelle Emerald, MD Taking Active   omeprazole (PRILOSEC) 20 MG capsule [id number] Yes TAKE 2 CAPSULES (40 MG TOTAL) BY MOUTH DAILY BEFORE BREAKFAST. Tanda Rockers, MD Taking Active   omeprazole (PRILOSEC) 40 MG capsule [id number] Yes TAKE 1 CAPUSLE BY MOUTH 30- 60 MIN BEFORE YOUR FIRST AND LAST MEALS OF THE DAY Tanda Rockers, MD Taking Active Spouse/Significant Other  ondansetron (ZOFRAN) 8 MG tablet [id number] Yes Take 1 tablet (8 mg total) by mouth every 8 (eight) hours as needed for nausea or vomiting. From narcotic pain medicine Tower, [name], MD Taking Active Spouse/Significant Other  OZEMPIC, 2 MG/DOSE, 8 MG/3ML SOPN [id number] Yes Inject 2 mg into the skin once a week. [provider] Taking Active   polyethylene glycol (MIRALAX / GLYCOLAX) 17 g packet [id number] Yes Take 17 g by mouth daily. [provider] Taking Active   predniSONE (DELTASONE) 10 MG tablet [id number] Yes Take prednisone 30 mg (3 tabs) for 3 days, then continue your maintenance dose of prednisone  Patient taking differently: Take 10 mg by mouth daily with breakfast.   Rai, Ripudeep K, MD Taking Active   rosuvastatin (CRESTOR) 40 MG tablet [id number] Yes Take 1 tablet (40 mg total) by mouth daily. Tower, [name], MD Taking Active   Med List  Note Annamaria Boots, [place] D, MD 10/05/11 1705): CPAP 11/ [employer]            Patient Active Problem List   Diagnosis Date Noted   Lumbar pain 01/30/2022   Chronic diastolic heart failure ([place]) 10/23/2021   AKI (acute kidney injury) ([place]) 09/17/2021   Stage 3b chronic kidney disease (CKD) ([place]) 09/17/2021   Acute coronary syndrome ([place]) 09/17/2021   Osteopenia 05/16/2021   Steroid dependent ([place]) 04/12/2021   Primary osteoarthritis of left knee 05/29/2020   Colon cancer screening 02/13/2017   Poorly controlled type 2 diabetes mellitus with circulatory disorder ([place]) 08/13/2016   Chronic heel pain, left 08/12/2016   Upper airway cough syndrome 07/31/2016   Class 1 obesity with serious comorbidity and body mass index (BMI) of 32.0 to 32.9 in adult 05/19/2016   Routine general medical examination at a health care facility 01/27/2016   Pulmonary infiltrates with high  ESR c/w BOOP/ idiopathic  12/19/2015   Cough 11/29/2015   Cystic kidney disease 10/01/2015   Renal lesion 09/27/2015   Erectile dysfunction of organic origin 09/27/2015   BPH with obstruction/lower urinary tract symptoms 09/27/2015   Constipation 09/24/2015   Cyst of right kidney 09/24/2015   CAD (coronary artery disease) 06/08/2015   Grieving 11/22/2014   Encounter for Medicare annual wellness exam 07/11/2013   Prostate cancer screening 07/03/2013   Right bundle branch block 02/22/2013   Chronic low back pain 05/13/2011   Obstructive sleep apnea 03/15/2010   Hyperlipidemia associated with type 2 diabetes mellitus ([place]) 04/12/2009   Gout 04/12/2009   Essential hypertension 04/12/2009   Coronary artery disease of bypass graft of native heart with stable  angina pectoris ([place]) 04/12/2009   GERD 04/12/2009   Renal insufficiency 04/12/2009   DIVERTICULITIS, HX OF 04/12/2009    Immunization History  Administered Date(s) Administered   Fluad Quad(high Dose 65+) 06/25/2019, 05/29/2022   Influenza Split 07/09/2011    Influenza Whole 05/14/2010, 06/09/2012   Influenza, High Dose Seasonal PF 05/22/2014, 07/04/2015, 05/16/2016, 07/09/2018   Influenza,inj,Quad PF,6+ Mos 07/11/2013   Influenza-Unspecified 07/03/2017, 06/14/2021   Moderna Sars-Covid-2 Vaccination 11/18/2019, 12/27/2019, 07/24/2020, 04/12/2021, 06/14/2021   Pfizer Covid-19 Vaccine Bivalent Booster 67yr & up 06/16/2022   Pneumococcal Conjugate-13 08/07/2014   Pneumococcal Polysaccharide-23 09/08/2002, 07/11/2013   Respiratory Syncytial Virus Vaccine,Recomb Aduvanted(Arexvy) 06/16/2022   Td 09/08/2004, 02/12/2016   Zoster Recombinat (Shingrix) 04/06/2018, 06/17/2018   Zoster, Live 07/09/2011    Conditions to be addressed/monitored:  Hypertension, Hyperlipidemia, Diabetes, Heart Failure, and Coronary Artery Disease  Care Plan : General Pharmacy (Adult)  Updates made by FCharlton Haws RParksdalesince 07/14/2022 12:00 AM     Problem: Disease mgmt Resolved 07/14/2022  Priority: High  Note:   Current Barriers:  None identified  Pharmacist Clinical Goal(s):  Patient will contact provider office for questions/concerns as evidenced notation of same in electronic health record through collaboration with PharmD and provider.   Interventions: 1:1 collaboration with Tower, MWynelle Fanny MD regarding development and update of comprehensive plan of care as evidenced by provider attestation and co-signature Inter-disciplinary care team collaboration (see longitudinal plan of care) Comprehensive medication review performed; medication list updated in electronic medical record  Hypertension / Heart Failure (BP goal <130/80) -Controlled - pt reports he has had no further falls/orthostatic events since switching metoprolol tartrate to succinate last month; he denies elevated HR/palpitations -Last ejection fraction: 60-65% (Date: 09/2021) -HF type: Diastolic (Grade I dysfunction) -Pt weights himself daily, reports dry weight around ~235# -Current  treatment: Furosemide 20 mg - 1/2 tab QOD - Appropriate, Effective, Safe, Accessible Metoprolol succinate 25 mg - 1/2 tab daily -Appropriate, Effective, Safe, Accessible Jardiance 10 mg daily -Appropriate, Effective, Safe, Accessible -Medications previously tried: isosorbide   -Educated on BP goals and benefits of medications for prevention of heart attack, stroke and kidney damage; Symptoms of hypotension and importance of maintaining adequate hydration; -Educated on Importance of weighing daily; if you gain more than 3 pounds in one day or 5 pounds in one week, take whole tablet of furosemide -Counseled to monitor BP/HR at home daily -Recommend to continue current medication  Hyperlipidemia: (LDL goal < 70) -Controlled - LDL 78 (04/2022), TRIG 325 -Hx CAD; CABG 1987, cardiac cath 09/1021 - diffuse disease, not a candidate for intervention -Current treatment: Rosuvastatin 40 mg daily - Appropriate, Effective, Safe, Accessible Ezetimibe 10 mg daily -Appropriate, Effective, Safe, Accessible Aspirin 81 mg daily -Appropriate, Effective, Safe, Accessible Nitroglycerin 0.4 mg SL prn -Appropriate, Effective, Safe, Accessible -Medications previously tried: n/a  -Current exercise habits: limited -Educated on Cholesterol goals; Benefits of statin for ASCVD risk reduction; -Recommended to continue current medication  Diabetes (A1c goal <7%) -Controlled - A1c 6.6% (04/2022); he complains of of occasional nausea, discussed this may be related to Ozempic - discussed eating smaller meals, fewer carbs/fats to prevent nausea Reviewed AGP report: 04/23/22 to 05/06/22. Sensor active: 77%  Time in range (70-180): 92% (goal > 70%)  High (>180): 8%  Low (< 70): 0% (goal < 4%)  GMI: 6.4%; Average glucose: 131 -Current medications: Jardiance 10 mg daily - Appropriate, Query Effective (GFR < 30) Novolin 70/30 - 30 units daily AM -Appropriate, Effective, Safe, Accessible Ozempic 2 mg  weekly -Appropriate,  Effective, Safe, Accessible Freestyle Libre 2 -Appropriate, Effective, Safe, Accessible -Medications previously tried: n/a  -Denies hypoglycemic/hyperglycemic symptoms -Educated on A1c and blood sugar goals; Prevention and management of hypoglycemic episodes; Continuous glucose monitoring; -Discussed Jardiance is probably not doing much for DM given GFR < 30, but would continue for HF/CKD benefits -Recommended to continue current medication  GERD (Goal: minimize symptoms of reflux ) -Controlled -Current treatment  Famotidine 20 mg daily HS - Appropriate, Effective, Safe, Accessible Omeprazole 40 mg daily BID -Appropriate, Effective, Safe, Accessible -Medications previously tried: none reported  -Hx of Bleeds/ulcers: No -Counseled on small meals, elevating head, and sleeping on left side -Recommended to continue current medication  Gout (Goal: Prevent gout flares) -Controlled -Last Gout Flare: years ago - prior to allopurinol -Current treatment  Allopurinol 100 mg BID - Appropriate, Effective, Safe, Accessible Colchicine 0.6 mg PRN -Appropriate, Effective, Safe, Accessible -Medications previously tried: n/a  -We discussed:  Counseled patient on low purine diet plan. Counseled patient to reduce consumption of high-fructose corn syrup, sweetened soft drinks, fruit juices, meat, and seafood. -Recommended to continue current medication  Chronic Kidney Disease Stage 4  -All medications assessed for renal dosing and appropriateness in chronic kidney disease. -Recommended to continue current medication  BOOP (Goal: manage symptoms) -Follows with pulmonology. No indication for O2 as of 05/2022 walk test -Hx OSA, on CPAP -Current treatment  Prednisone 10 mg daily - Appropriate, Effective, Safe, Accessible Benzonatate 200 mg -Appropriate, Effective, Safe, Accessible -Medications previously tried: gabapentin  -Recommended to continue current medication  Health Maintenance -Current  therapy:  Miralax Zofran PRN Glucosamine Lutein  Vitamin D3 -Patient is satisfied with current therapy and denies issues -Recommended to continue current medication  Patient Goals/Self-Care Activities Patient will:  - take medications as prescribed as evidenced by patient report and record review focus on medication adherence by routine check glucose using CGM, document, and provide at future appointments check blood pressure daily, document, and provide at future appointments weigh daily, and contact provider if weight gain of 3+ lbs overnight, 5+ lbs in a week      Medication Assistance: None required.  Patient affirms current coverage meets needs.  Compliance/Adherence/Medication fill history: Care Gaps: None  Star-Rating Drugs: Jardiance - PDC 98% Rosuvastatin - PDC 96%  Medication Access: Within the past 30 days, how often has patient missed a dose of medication? 0 Is a pillbox or other method used to improve adherence? Yes  Factors that may affect medication adherence? no barriers identified Are meds synced by current pharmacy? No  Are meds delivered by current pharmacy? No  Does patient experience delays in picking up medications due to transportation concerns? No   [facility] Reviewed: Is patient disadvantaged to use [facility]?: No  Current Rx insurance plan: HTA Name and location of Current pharmacy:  [id number] [place]Phone: [phone]: [phone] [facility] services reviewed with patient today?: No  Patient requests to transfer care to [facility]?: No  Reason patient declined to change pharmacies: Not mentioned at this visit   Care Plan and Follow Up Patient Decision:  Patient agrees to Care Plan and Follow-up.  Plan: The patient has been provided with contact information for the care management team and has been advised to call with any  health related questions or concerns.   LCharlene Brooke PharmD, BCACP Clinical Pharmacist [phone]

## 2022-07-15 DIAGNOSIS — Z794 Long term (current) use of insulin: Secondary | ICD-10-CM | POA: Diagnosis not present

## 2022-07-15 DIAGNOSIS — E1122 Type 2 diabetes mellitus with diabetic chronic kidney disease: Secondary | ICD-10-CM | POA: Diagnosis not present

## 2022-07-15 DIAGNOSIS — N184 Chronic kidney disease, stage 4 (severe): Secondary | ICD-10-CM | POA: Diagnosis not present

## 2022-07-15 DIAGNOSIS — I10 Essential (primary) hypertension: Secondary | ICD-10-CM | POA: Diagnosis not present

## 2022-07-15 DIAGNOSIS — I25118 Atherosclerotic heart disease of native coronary artery with other forms of angina pectoris: Secondary | ICD-10-CM | POA: Diagnosis not present

## 2022-07-15 DIAGNOSIS — Z951 Presence of aortocoronary bypass graft: Secondary | ICD-10-CM | POA: Diagnosis not present

## 2022-07-15 DIAGNOSIS — Z7984 Long term (current) use of oral hypoglycemic drugs: Secondary | ICD-10-CM | POA: Diagnosis not present

## 2022-07-15 DIAGNOSIS — D692 Other nonthrombocytopenic purpura: Secondary | ICD-10-CM | POA: Diagnosis not present

## 2022-08-12 ENCOUNTER — Other Ambulatory Visit: Payer: Self-pay | Admitting: Medical

## 2022-08-17 ENCOUNTER — Emergency Department: Payer: PPO

## 2022-08-17 ENCOUNTER — Inpatient Hospital Stay
Admission: EM | Admit: 2022-08-17 | Discharge: 2022-08-19 | DRG: 872 | Disposition: A | Discharge status: Home / Self Care | Payer: PPO | Attending: Internal Medicine | Admitting: Internal Medicine

## 2022-08-17 DIAGNOSIS — Z87891 Personal history of nicotine dependence: Secondary | ICD-10-CM | POA: Diagnosis not present

## 2022-08-17 DIAGNOSIS — A419 Sepsis, unspecified organism: Principal | ICD-10-CM

## 2022-08-17 DIAGNOSIS — I10 Essential (primary) hypertension: Secondary | ICD-10-CM | POA: Diagnosis present

## 2022-08-17 DIAGNOSIS — N179 Acute kidney failure, unspecified: Secondary | ICD-10-CM | POA: Diagnosis present

## 2022-08-17 DIAGNOSIS — Z79899 Other long term (current) drug therapy: Secondary | ICD-10-CM

## 2022-08-17 DIAGNOSIS — K219 Gastro-esophageal reflux disease without esophagitis: Secondary | ICD-10-CM | POA: Diagnosis present

## 2022-08-17 DIAGNOSIS — Z9989 Dependence on other enabling machines and devices: Secondary | ICD-10-CM

## 2022-08-17 DIAGNOSIS — I25708 Atherosclerosis of coronary artery bypass graft(s), unspecified, with other forms of angina pectoris: Secondary | ICD-10-CM | POA: Diagnosis present

## 2022-08-17 DIAGNOSIS — N2889 Other specified disorders of kidney and ureter: Secondary | ICD-10-CM | POA: Diagnosis not present

## 2022-08-17 DIAGNOSIS — I4510 Unspecified right bundle-branch block: Secondary | ICD-10-CM | POA: Diagnosis not present

## 2022-08-17 DIAGNOSIS — M109 Gout, unspecified: Secondary | ICD-10-CM | POA: Diagnosis present

## 2022-08-17 DIAGNOSIS — Z66 Do not resuscitate: Secondary | ICD-10-CM | POA: Diagnosis not present

## 2022-08-17 DIAGNOSIS — E785 Hyperlipidemia, unspecified: Secondary | ICD-10-CM | POA: Diagnosis present

## 2022-08-17 DIAGNOSIS — D84821 Immunodeficiency due to drugs: Secondary | ICD-10-CM | POA: Diagnosis present

## 2022-08-17 DIAGNOSIS — E1122 Type 2 diabetes mellitus with diabetic chronic kidney disease: Secondary | ICD-10-CM | POA: Diagnosis not present

## 2022-08-17 DIAGNOSIS — Q613 Polycystic kidney, unspecified: Secondary | ICD-10-CM

## 2022-08-17 DIAGNOSIS — E860 Dehydration: Secondary | ICD-10-CM | POA: Diagnosis not present

## 2022-08-17 DIAGNOSIS — Z1152 Encounter for screening for COVID-19: Secondary | ICD-10-CM

## 2022-08-17 DIAGNOSIS — Q619 Cystic kidney disease, unspecified: Secondary | ICD-10-CM

## 2022-08-17 DIAGNOSIS — R079 Chest pain, unspecified: Secondary | ICD-10-CM | POA: Diagnosis not present

## 2022-08-17 DIAGNOSIS — Z6834 Body mass index (BMI) 34.0-34.9, adult: Secondary | ICD-10-CM

## 2022-08-17 DIAGNOSIS — E872 Acidosis, unspecified: Secondary | ICD-10-CM | POA: Diagnosis not present

## 2022-08-17 DIAGNOSIS — R112 Nausea with vomiting, unspecified: Secondary | ICD-10-CM | POA: Diagnosis not present

## 2022-08-17 DIAGNOSIS — R6520 Severe sepsis without septic shock: Secondary | ICD-10-CM | POA: Diagnosis not present

## 2022-08-17 DIAGNOSIS — Z7952 Long term (current) use of systemic steroids: Secondary | ICD-10-CM

## 2022-08-17 DIAGNOSIS — Z794 Long term (current) use of insulin: Secondary | ICD-10-CM | POA: Diagnosis not present

## 2022-08-17 DIAGNOSIS — Z8249 Family history of ischemic heart disease and other diseases of the circulatory system: Secondary | ICD-10-CM

## 2022-08-17 DIAGNOSIS — Z8701 Personal history of pneumonia (recurrent): Secondary | ICD-10-CM

## 2022-08-17 DIAGNOSIS — N1832 Chronic kidney disease, stage 3b: Secondary | ICD-10-CM | POA: Diagnosis present

## 2022-08-17 DIAGNOSIS — I130 Hypertensive heart and chronic kidney disease with heart failure and stage 1 through stage 4 chronic kidney disease, or unspecified chronic kidney disease: Secondary | ICD-10-CM | POA: Diagnosis not present

## 2022-08-17 DIAGNOSIS — R1111 Vomiting without nausea: Secondary | ICD-10-CM | POA: Diagnosis not present

## 2022-08-17 DIAGNOSIS — J811 Chronic pulmonary edema: Secondary | ICD-10-CM | POA: Diagnosis not present

## 2022-08-17 DIAGNOSIS — J8489 Other specified interstitial pulmonary diseases: Secondary | ICD-10-CM | POA: Diagnosis not present

## 2022-08-17 DIAGNOSIS — I2489 Other forms of acute ischemic heart disease: Secondary | ICD-10-CM | POA: Diagnosis present

## 2022-08-17 DIAGNOSIS — M810 Age-related osteoporosis without current pathological fracture: Secondary | ICD-10-CM | POA: Diagnosis not present

## 2022-08-17 DIAGNOSIS — Z885 Allergy status to narcotic agent status: Secondary | ICD-10-CM

## 2022-08-17 DIAGNOSIS — I5032 Chronic diastolic (congestive) heart failure: Secondary | ICD-10-CM | POA: Diagnosis not present

## 2022-08-17 DIAGNOSIS — Z7984 Long term (current) use of oral hypoglycemic drugs: Secondary | ICD-10-CM

## 2022-08-17 DIAGNOSIS — R197 Diarrhea, unspecified: Secondary | ICD-10-CM | POA: Diagnosis not present

## 2022-08-17 DIAGNOSIS — Z823 Family history of stroke: Secondary | ICD-10-CM

## 2022-08-17 DIAGNOSIS — E669 Obesity, unspecified: Secondary | ICD-10-CM | POA: Diagnosis not present

## 2022-08-17 DIAGNOSIS — G4733 Obstructive sleep apnea (adult) (pediatric): Secondary | ICD-10-CM | POA: Diagnosis not present

## 2022-08-17 DIAGNOSIS — Z96652 Presence of left artificial knee joint: Secondary | ICD-10-CM | POA: Diagnosis present

## 2022-08-17 DIAGNOSIS — K7689 Other specified diseases of liver: Secondary | ICD-10-CM | POA: Diagnosis not present

## 2022-08-17 DIAGNOSIS — R000 Tachycardia, unspecified: Secondary | ICD-10-CM | POA: Diagnosis not present

## 2022-08-17 DIAGNOSIS — R5381 Other malaise: Secondary | ICD-10-CM | POA: Diagnosis not present

## 2022-08-17 DIAGNOSIS — F1920 Other psychoactive substance dependence, uncomplicated: Secondary | ICD-10-CM

## 2022-08-17 DIAGNOSIS — Z7982 Long term (current) use of aspirin: Secondary | ICD-10-CM

## 2022-08-17 DIAGNOSIS — Z7985 Long-term (current) use of injectable non-insulin antidiabetic drugs: Secondary | ICD-10-CM

## 2022-08-17 DIAGNOSIS — I5033 Acute on chronic diastolic (congestive) heart failure: Secondary | ICD-10-CM | POA: Diagnosis present

## 2022-08-17 DIAGNOSIS — E1165 Type 2 diabetes mellitus with hyperglycemia: Secondary | ICD-10-CM

## 2022-08-17 DIAGNOSIS — R918 Other nonspecific abnormal finding of lung field: Secondary | ICD-10-CM | POA: Diagnosis present

## 2022-08-17 DIAGNOSIS — I252 Old myocardial infarction: Secondary | ICD-10-CM

## 2022-08-17 DIAGNOSIS — K529 Noninfective gastroenteritis and colitis, unspecified: Secondary | ICD-10-CM | POA: Diagnosis not present

## 2022-08-17 LAB — COMPREHENSIVE METABOLIC PANEL
ALT: 26 U/L (ref 0–44)
AST: 33 U/L (ref 15–41)
Albumin: 4.1 g/dL (ref 3.5–5.0)
Alkaline Phosphatase: 50 U/L (ref 38–126)
Anion gap: 16 — ABNORMAL HIGH (ref 5–15)
BUN: 57 mg/dL — ABNORMAL HIGH (ref 8–23)
CO2: 18 mmol/L — ABNORMAL LOW (ref 22–32)
Calcium: 9.1 mg/dL (ref 8.9–10.3)
Chloride: 105 mmol/L (ref 98–111)
Creatinine, Ser: 3.04 mg/dL — ABNORMAL HIGH (ref 0.61–1.24)
GFR, Estimated: 20 mL/min — ABNORMAL LOW (ref >60)
Glucose, Bld: 184 mg/dL — ABNORMAL HIGH (ref 70–99)
Potassium: 4.2 mmol/L (ref 3.5–5.1)
Sodium: 139 mmol/L (ref 135–145)
Total Bilirubin: 1.3 mg/dL — ABNORMAL HIGH (ref 0.3–1.2)
Total Protein: 7.2 g/dL (ref 6.5–8.1)

## 2022-08-17 LAB — TROPONIN I (HIGH SENSITIVITY)
Troponin I (High Sensitivity): 89 ng/L — ABNORMAL HIGH (ref <18)
Troponin I (High Sensitivity): 84 ng/L — ABNORMAL HIGH (ref <18)

## 2022-08-17 LAB — URINALYSIS, ROUTINE W REFLEX MICROSCOPIC
Bacteria, UA: NONE SEEN
Bilirubin Urine: NEGATIVE
Glucose, UA: >=500 mg/dL — AB
Hgb urine dipstick: NEGATIVE
Ketones, ur: 5 mg/dL — AB
Leukocytes,Ua: NEGATIVE
Nitrite: NEGATIVE
Protein, ur: 100 mg/dL — AB
Specific Gravity, Urine: 1.015 (ref 1.005–1.030)
Squamous Epithelial / HPF: NONE SEEN (ref 0–5)
pH: 5 (ref 5.0–8.0)

## 2022-08-17 LAB — LACTIC ACID, PLASMA
Lactic Acid, Venous: 3 mmol/L (ref 0.5–1.9)
Lactic Acid, Venous: 2.5 mmol/L (ref 0.5–1.9)

## 2022-08-17 LAB — RESP PANEL BY RT-PCR (FLU A&B, COVID) ARPGX2
Influenza A by PCR: NEGATIVE
Influenza B by PCR: NEGATIVE
SARS Coronavirus 2 by RT PCR: NEGATIVE

## 2022-08-17 LAB — CBC
HCT: 51.9 % (ref 39.0–52.0)
Hemoglobin: 16.3 g/dL (ref 13.0–17.0)
MCH: 30.9 pg (ref 26.0–34.0)
MCHC: 31.4 g/dL (ref 30.0–36.0)
MCV: 98.5 fL (ref 80.0–100.0)
Platelets: 223 10*3/uL (ref 150–400)
RBC: 5.27 MIL/uL (ref 4.22–5.81)
RDW: 14.8 % (ref 11.5–15.5)
WBC: 14.8 10*3/uL — ABNORMAL HIGH (ref 4.0–10.5)
nRBC: 0.5 % — ABNORMAL HIGH (ref 0.0–0.2)

## 2022-08-17 LAB — BRAIN NATRIURETIC PEPTIDE: B Natriuretic Peptide: 205.2 pg/mL — ABNORMAL HIGH (ref 0.0–100.0)

## 2022-08-17 LAB — LIPASE, BLOOD: Lipase: 36 U/L (ref 11–51)

## 2022-08-17 MED ORDER — METOPROLOL SUCCINATE ER 25 MG PO TB24
12.5000 mg | ORAL_TABLET | Freq: Every day | ORAL | Status: DC
  Administered 2022-08-17: 12.5 mg via ORAL
  Filled 2022-08-17: qty 0.5

## 2022-08-17 MED ORDER — LACTATED RINGERS IV BOLUS
1000.0000 mL | Freq: Once | INTRAVENOUS | Status: AC
  Administered 2022-08-17: 1000 mL via INTRAVENOUS

## 2022-08-17 MED ORDER — ONDANSETRON HCL 4 MG/2ML IJ SOLN
4.0000 mg | Freq: Once | INTRAMUSCULAR | Status: AC
  Administered 2022-08-17: 4 mg via INTRAVENOUS
  Filled 2022-08-17: qty 2

## 2022-08-17 NOTE — H&P (Addendum)
History and Physical    Patient: Kurt Aguilar WGY:[id number] DOB: 1946/06/27 DOA: 08/17/2022 DOS: the patient was seen and examined on 08/17/2022 PCP: [name], MD  Patient coming from: Home  Chief Complaint:  Chief Complaint  Patient presents with   Weakness    HPI: Kurt Aguilar is a 76 y.o. male with medical history significant for CAD status post CABGx5 in 1997, IDDM 2, HTN, HLD, BOOP with chronic shortness of breath and on chronic steroids, CKD 3B, who presents to the ED with a 4-day history of nausea vomiting and diarrhea associated with poor oral intake and generalized weakness.  Emesis is nonbloody and nonbilious and stool is nonbloody and nonmelanotic.  His wife has similar symptoms.  He also has chills without fever.  He denies abdominal pain.  On the day prior he had anginal type pain which resolved with sublingual nitroglycerin and has not recurred.  He denies cough and his shortness of breath is at baseline. ED course and Data review: Patient had a low BP of 82/61, with pulse 136 O2 sat 99% on room air. Labs with WBC 14,800 with lactic acid 3.0-2.5.  Creatinine 3.04 up from baseline of 1.77 about 9 months prior with bicarb of 18, anion gap 16.  Blood glucose 184.  COVID and flu negative, urinalysis significant for glucosuria and mild ketonuria.  Troponin 84.  C. difficile and GI panel pending. EKG, personally viewed and interpreted showing sinus tachycardia at 137 with a right bundle branch block and no ischemic ST-T wave changes. Chest x-ray shows cardiomegaly with mild pulmonary vascular congestion.   CT abdomen and pelvis showing findings consistent with polycystic kidney disease but otherwise nonacute as detailed below: IMPRESSION: 1. No acute noncontrast CT findings of the abdomen or pelvis to explain left lower quadrant abdominal pain. Specifically, no evidence of diverticular disease or diverticulitis. 2. Innumerable bilateral renal cysts, some of which  are intrinsically hyperdense or with peripheral calcifications. No obvious solid mass by noncontrast CT. Findings are consistent with polycystic kidney disease. Solid renal lesion may be more confidently excluded by multiphasic contrast enhanced CT or MRI on a nonemergent, outpatient basis if desired. 3. Mild prostatomegaly. 4. Coronary artery disease. 5. Aortic valve calcifications. Correlate for echocardiographic evidence of aortic valve dysfunction.  Patient treated with 3 L LR bolus.  Hospitalist consulted for admission.   Review of Systems: As mentioned in the history of present illness. All other systems reviewed and are negative.  Past Medical History:  Diagnosis Date   Arthritis    Chronic kidney disease    Complication of anesthesia    constipation and inability to  urinate happened a few days after cataract surgery    Diabetes ([place])    type 2    Diverticulitis    GERD (gastroesophageal reflux disease)    Gout    Heart attack ([place])    Heart disease    Heart murmur    Hyperglycemia    Hyperlipidemia    Hypertension    Osteoporosis    Pneumonia    hx of BOOP followed by Dr [name]    Sleep apnea    cpapp - setting at 17    Past Surgical History:  Procedure Laterality Date   ACROMIO-CLAVICULAR JOINT REPAIR Left 05/05/2019   Procedure: SHOULDER ARTHROSCOPIC ASSISTED ACROMIO-CLAVICULAR JOINT REPAIR;  Surgeon: Tania Ade, MD;  Location: [facility];  Service: Orthopedics;  Laterality: Left;   CATARACT EXTRACTION Bilateral    CORONARY ARTERY BYPASS GRAFT  [place]   LEFT HEART CATH AND CORS/GRAFTS ANGIOGRAPHY N/A 09/19/2021   Procedure: LEFT HEART CATH AND CORS/GRAFTS ANGIOGRAPHY;  Surgeon: Belva Crome, MD;  Location: [facility];  Service: Cardiovascular;  Laterality: N/A;   open heart surgery  09/09/1995-1998   5 bypass   REPAIR KNEE LIGAMENT     right   SHOULDER ARTHROSCOPY Left 05/05/2019   Procedure: ARTHROSCOPY SHOULDER;  Surgeon: Tania Ade, MD;  Location: [facility];  Service: Orthopedics;  Laterality: Left;   TONSILLECTOMY     TOTAL KNEE ARTHROPLASTY Left 05/29/2020   Procedure: LEFT TOTAL KNEE ARTHROPLASTY;  Surgeon: Melrose Nakayama, MD;  Location: [facility];  Service: Orthopedics;  Laterality: Left;   Social History:  reports that he quit smoking about 17 years ago. His smoking use included cigars and cigarettes. He has a 3.00 pack-year smoking history. He has never used smokeless tobacco. He reports current alcohol use of about 1.0 standard drink of alcohol per week. He reports that he does not use drugs.  Allergies  Allergen Reactions   Oxycodone-Acetaminophen Other (See Comments)    Stops breathing; has tolerated Tylenol before   Oxycodone-Acetaminophen Anaphylaxis    Family History  Problem Relation Age of Onset   Heart disease Mother    Heart disease Father    Stroke Father    Heart attack Son 53   Kidney disease Neg Hx    Prostate cancer Neg Hx    Diabetes Neg Hx     Prior to Admission medications   Medication Sig Start Date End Date Taking? Authorizing Provider  allopurinol (ZYLOPRIM) 100 MG tablet TAKE 1 TABLET BY MOUTH TWICE A DAY 04/14/22   Tower, [name], MD  aspirin EC (ASPIRIN 81) 81 MG tablet Take 81 mg by mouth daily. Swallow whole.    [provider]  benzonatate (TESSALON) 200 MG capsule Take 1 capsule (200 mg total) by mouth 3 (three) times daily as needed for cough. 09/24/21   Rai, Vernelle Emerald, MD  Calcium Carbonate-Vit D-Min (CALCIUM-VITAMIN D-MINERALS) 600-800 MG-UNIT CHEW Chew 1 tablet by mouth daily.    [provider]  colchicine 0.6 MG tablet TAKE 1 TABLET BY MOUTH TWICE A DAY WITH A MEAL AS NEEDED FOR ACUTE FLARE OF GOUT 03/16/18   Tower, [name], MD  Continuous Blood Gluc Receiver (FREESTYLE LIBRE 2 READER) DEVI USE AS INSTRUCTED TO CHECK BLOOD SUGARS. 01/13/22   Renato Shin, MD  Continuous Blood Gluc Sensor (FREESTYLE LIBRE 2 SENSOR) MISC 1 Device by Does not apply route every  14 (fourteen) days. 12/06/21   Renato Shin, MD  empagliflozin (JARDIANCE) 10 MG TABS tablet Take 1 tablet (10 mg total) by mouth daily. 06/11/22   Philemon Kingdom, MD  ezetimibe (ZETIA) 10 MG tablet TAKE 1 TABLET BY MOUTH EVERY DAY 04/14/22   Tower, [name], MD  famotidine (PEPCID) 20 MG tablet TAKE 1 TABLET BY MOUTH EVERYDAY AT BEDTIME 06/18/22   Tanda Rockers, MD  furosemide (LASIX) 20 MG tablet TAKE 1 TABLET BY MOUTH EVERY DAY 08/13/22   Minna Merritts, MD  Glucosamine HCl (GLUCOSAMINE PO) Take 1,500 mg by mouth 2 (two) times daily.     [provider]  glucose blood (FREESTYLE LITE) test strip 1 each by Other route 4 (four) times daily. E11.65 04/10/20   Renato Shin, MD  insulin isophane & regular human KwikPen (NOVOLIN 70/30 KWIKPEN) (70-30) 100 UNIT/ML KwikPen Inject 30 Units into the skin daily with breakfast. And pen needles  1/day Patient taking differently: Inject 25 Units into the skin daily with breakfast. And pen needles 1/day 01/09/22   Renato Shin, MD  Insulin Pen Needle 31G X 8 MM MISC Use as instructed to inject insulin 1x daily 02/21/22   Philemon Kingdom, MD  isosorbide mononitrate (IMDUR) 30 MG 24 hr tablet Take 1 tablet by mouth daily.    [provider]  Lancets (FREESTYLE) lancets 1 EACH BY OTHER ROUTE 4 (FOUR) TIMES DAILY. E11.65 08/23/20   Renato Shin, MD  Lutein 20 MG CAPS Take 20 mg by mouth daily.     [provider]  metoprolol succinate (TOPROL-XL) 25 MG 24 hr tablet Take 0.5 tablets (12.5 mg total) by mouth at bedtime. 06/13/22   Minna Merritts, MD  nitroGLYCERIN (NITROSTAT) 0.4 MG SL tablet Place 1 tablet (0.4 mg total) under the tongue every 5 (five) minutes x 3 doses as needed for chest pain. 09/24/21   Rai, Ripudeep K, MD  omeprazole (PRILOSEC) 20 MG capsule TAKE 2 CAPSULES (40 MG TOTAL) BY MOUTH DAILY BEFORE BREAKFAST. 06/18/22   Tanda Rockers, MD  omeprazole (PRILOSEC) 40 MG capsule TAKE 1 CAPUSLE BY MOUTH 30- 60 MIN BEFORE YOUR  FIRST AND LAST MEALS OF THE DAY 09/13/21   Tanda Rockers, MD  ondansetron (ZOFRAN) 8 MG tablet Take 1 tablet (8 mg total) by mouth every 8 (eight) hours as needed for nausea or vomiting. From narcotic pain medicine 01/03/20   Tower, [name], MD  OZEMPIC, 2 MG/DOSE, 8 MG/3ML SOPN Inject 2 mg into the skin once a week. 05/09/22   [provider]  polyethylene glycol (MIRALAX / GLYCOLAX) 17 g packet Take 17 g by mouth daily.    [provider]  predniSONE (DELTASONE) 10 MG tablet Take prednisone 30 mg (3 tabs) for 3 days, then continue your maintenance dose of prednisone Patient taking differently: Take 10 mg by mouth daily with breakfast. 09/24/21   Rai, Ripudeep K, MD  rosuvastatin (CRESTOR) 40 MG tablet Take 1 tablet (40 mg total) by mouth daily. 04/03/22   [name], MD    Physical Exam: Vitals:   08/17/22 1916 08/17/22 2044 08/17/22 2210 08/17/22 2237  BP: 129/72 114/65 (!) 82/61 103/66  Pulse: (!) 119 (!) 110 (!) 101   Resp: _0 Temp:      TempSrc:      SpO2: 98% 91% 91%   Weight:       Physical Exam Vitals and nursing note reviewed.  Constitutional:      General: He is not in acute distress.    Appearance: He is ill-appearing.  HENT:     Head: Normocephalic and atraumatic.  Cardiovascular:     Rate and Rhythm: Normal rate and regular rhythm.     Heart sounds: Normal heart sounds.  Pulmonary:     Effort: Pulmonary effort is normal.     Comments: Coarse wheezes Abdominal:     Palpations: Abdomen is soft.     Tenderness: There is no abdominal tenderness.  Neurological:     General: No focal deficit present.     Mental Status: Mental status is at baseline.     Labs on Admission: I have personally reviewed following labs and imaging studies  CBC: Recent Labs  Lab 08/17/22 1734  WBC 14.8*  HGB 16.3  HCT 51.9  MCV 98.5  PLT 371   Basic Metabolic Panel: Recent Labs  Lab 08/17/22 1755  NA 139  K  4.2  CL 105  CO2 18*  GLUCOSE 184*  BUN  57*  CREATININE 3.04*  CALCIUM 9.1   GFR: Estimated Creatinine Clearance: 25 mL/min (A) (by C-G formula based on SCr of 3.04 mg/dL (H)). Liver Function Tests: Recent Labs  Lab 08/17/22 1755  AST 33  ALT 26  ALKPHOS 50  BILITOT 1.3*  PROT 7.2  ALBUMIN 4.1   Recent Labs  Lab 08/17/22 1755  LIPASE 36   No results for input(s): "AMMONIA" in the last 168 hours. Coagulation Profile: No results for input(s): "INR", "PROTIME" in the last 168 hours. Cardiac Enzymes: No results for input(s): "CKTOTAL", "CKMB", "CKMBINDEX", "TROPONINI" in the last 168 hours. BNP (last 3 results) No results for input(s): "PROBNP" in the last 8760 hours. HbA1C: No results for input(s): "HGBA1C" in the last 72 hours. CBG: No results for input(s): "GLUCAP" in the last 168 hours. Lipid Profile: No results for input(s): "CHOL", "HDL", "LDLCALC", "TRIG", "CHOLHDL", "LDLDIRECT" in the last 72 hours. Thyroid Function Tests: No results for input(s): "TSH", "T4TOTAL", "FREET4", "T3FREE", "THYROIDAB" in the last 72 hours. Anemia Panel: No results for input(s): "VITAMINB12", "FOLATE", "FERRITIN", "TIBC", "IRON", "RETICCTPCT" in the last 72 hours. Urine analysis:    Component Value Date/Time   COLORURINE YELLOW (A) 08/17/2022 2131   APPEARANCEUR HAZY (A) 08/17/2022 2131   LABSPEC 1.015 08/17/2022 2131   PHURINE 5.0 08/17/2022 2131   GLUCOSEU >=500 (A) 08/17/2022 2131   GLUCOSEU NEGATIVE 11/25/2010 0951   HGBUR NEGATIVE 08/17/2022 2131   BILIRUBINUR NEGATIVE 08/17/2022 2131   BILIRUBINUR neg. 08/30/2012 1548   KETONESUR 5 (A) 08/17/2022 2131   PROTEINUR 100 (A) 08/17/2022 2131   UROBILINOGEN 0.2 08/30/2012 1548   UROBILINOGEN 0.2 11/25/2010 0951   NITRITE NEGATIVE 08/17/2022 2131   LEUKOCYTESUR NEGATIVE 08/17/2022 2131    Radiological Exams on Admission: DG Chest Portable 1 View  Result Date: 08/17/2022 CLINICAL DATA:  Chest pain. EXAM: PORTABLE CHEST 1 VIEW COMPARISON:  Chest radiograph dated  December 18, 2021 FINDINGS: The heart is enlarged with evidence of prior coronary artery bypass grafting. Mild pulmonary vascular congestion. No evidence of pulmonary edema or large pleural effusion. Thoracic spondylosis. IMPRESSION: Cardiomegaly with mild pulmonary vascular congestion. No evidence of pulmonary edema or large pleural effusion. Electronically Signed   By: Keane Police D.O.   On: 08/17/2022 21:31   CT ABDOMEN PELVIS WO CONTRAST  Result Date: 08/17/2022 CLINICAL DATA:  Left lower quadrant abdominal pain, nausea and vomiting since yesterday EXAM: CT ABDOMEN AND PELVIS WITHOUT CONTRAST TECHNIQUE: Multidetector CT imaging of the abdomen and pelvis was performed following the standard protocol without IV contrast. RADIATION DOSE REDUCTION: This exam was performed according to the departmental dose-optimization program which includes automated exposure control, adjustment of the mA and/or kV according to patient size and/or use of iterative reconstruction technique. COMPARISON:  MR abdomen, 05/07/2022, CT abdomen pelvis, 09/14/2015 FINDINGS: Lower chest: No acute abnormality. Aortic valve calcifications. Coronary artery calcifications. Scarring and or atelectasis of the bilateral lung bases. Hepatobiliary: No solid liver abnormality is seen. Multiple small fluid attenuation cysts and additional subcentimeter low-attenuation lesions too small to characterize although most likely simple cysts, in general better characterized by prior MR and contrast enhanced CT and benign. No further follow-up or characterization is required. No gallstones, gallbladder wall thickening, or biliary dilatation. Pancreas: Unremarkable. No pancreatic ductal dilatation or surrounding inflammatory changes. Spleen: Normal in size without significant abnormality. Adrenals/Urinary Tract: Adrenal glands are unremarkable. Innumerable bilateral renal cysts, some of which are intrinsically hyperdense  or with peripheral calcifications. No  obvious solid mass noncontrast CT. No calculi or hydronephrosis. Bladder is unremarkable. Stomach/Bowel: Stomach is within normal limits. Appendix appears normal. No evidence of bowel wall thickening, distention, or inflammatory changes. Vascular/Lymphatic: Aortic atherosclerosis. No enlarged abdominal or pelvic lymph nodes. Reproductive: Mild prostatomegaly. Other: Small, fat containing bilateral inguinal hernias. No ascites. Musculoskeletal: No acute or significant osseous findings. Disc degenerative disease and bridging osteophytosis throughout the lower thoracic and upper lumbar spine in keeping with DISH. IMPRESSION: 1. No acute noncontrast CT findings of the abdomen or pelvis to explain left lower quadrant abdominal pain. Specifically, no evidence of diverticular disease or diverticulitis. 2. Innumerable bilateral renal cysts, some of which are intrinsically hyperdense or with peripheral calcifications. No obvious solid mass by noncontrast CT. Findings are consistent with polycystic kidney disease. Solid renal lesion may be more confidently excluded by multiphasic contrast enhanced CT or MRI on a nonemergent, outpatient basis if desired. 3. Mild prostatomegaly. 4. Coronary artery disease. 5. Aortic valve calcifications. Correlate for echocardiographic evidence of aortic valve dysfunction. Aortic Atherosclerosis (ICD10-I70.0). Electronically Signed   By: Delanna Ahmadi M.D.   On: 08/17/2022 20:00     Data Reviewed: Relevant notes from primary care and specialist visits, past discharge summaries as available in EHR, including Care Everywhere. Prior diagnostic testing as pertinent to current admission diagnoses Updated medications and problem lists for reconciliation ED course, including vitals, labs, imaging, treatment and response to treatment Triage notes, nursing and pharmacy notes and ED provider's notes Notable results as noted in HPI   Assessment and Plan: * Acute gastroenteritis Severe Sepsis   At high risk for severe infection due to chronic prednisone therapy Patient presents with a 4-day history of nausea, vomiting and diarrhea, hypotensive and tachycardic with leukocytosis and lactic acidosis and AKI Sepsis fluids Suspect viral etiology to gastro enteritis so holding off on antibiotics pending stool studies Follow-up GI panel and stool for C. difficile   Acute renal failure superimposed on stage 3b chronic kidney disease ([place]) Secondary to dehydration and sepsis Creatinine was 3.04 from baseline of 1.77 IV hydration and avoid nephrotoxins  Chronic diastolic heart failure ([place]) Euvolemic to dry Monitor for fluid overload in view of IV fluid resuscitation for management of sepsis Continue metoprolol, Lasix, Imdur, with monitoring for hypotension with these meds in the setting of sepsis Daily weights  Pulmonary infiltrates with high  ESR c/w BOOP/ idiopathic  Chronic immunosuppression with prednisone History of Boop, followed by pulmonology No suspected acute issues at this time  Cystic kidney disease CT showing polycystic kidney disease, known  Coronary artery disease of bypass graft of native heart with stable angina pectoris ([facility]) Elevated troponin Troponin elevated at 84, EKG with known RBBB Patient did have an episode of chest pain the day prior to arrival which resolved with a single sublingual nitroglycerin Continue metoprolol, aspirin, isosorbide and rosuvastatin.  NTG as needed chest pain Continue to trend troponin  Essential hypertension BP somewhat soft.  Continue home antihypertensives with hold parameters  Obstructive sleep apnea CPAP        DVT prophylaxis: Lovenox  Consults: none  Advance Care Planning:   Code Status: Prior   Family Communication: none  Disposition Plan: Back to previous home environment  Severity of Illness: The appropriate patient status for this patient is INPATIENT. Inpatient status is judged to be reasonable and  necessary in order to provide the required intensity of service to ensure the patient's safety. The patient's presenting symptoms, physical exam findings,  and initial radiographic and laboratory data in the context of their chronic comorbidities is felt to place them at high risk for further clinical deterioration. Furthermore, it is not anticipated that the patient will be medically stable for discharge from the hospital within 2 midnights of admission.   * I certify that at the point of admission it is my clinical judgment that the patient will require inpatient hospital care spanning beyond 2 midnights from the point of admission due to high intensity of service, high risk for further deterioration and high frequency of surveillance required.*  Author: Athena Masse, MD 08/17/2022 11:31 PM  For on call review [email or web address].

## 2022-08-17 NOTE — ED Notes (Signed)
MD notified of critical lactic of 3.0

## 2022-08-17 NOTE — ED Provider Notes (Signed)
[facility] Provider Note    Event Date/Time   First MD Initiated Contact with Patient 08/17/22 1736     (approximate)   History   Weakness   HPI  Kurt Aguilar is a 76 y.o. male past medical history of GERD CAD hypertension hyperlipidemia CKD who presents with nausea vomiting diarrhea and generalized weakness.  Patient tells me he has been sick for the last 2 days.  Has had multiple episodes of diarrhea about 3 to 4/day nonbloody as well was 2 episodes of vomiting yesterday and today.  He is feeling very weak not been eating much because of decreased appetite and inability to tolerate p.o.  He has had some cough and congestion denies fevers or chills.  Denies abdominal pain.  He has not had any sick contacts.  Tells me he had an episode of angina yesterday and took nitroglycerin.  Occurred when he was feeling stressed this is not atypical for him.  Has not had any chest pain since.  No exertional chest pain.  Did not take any of his medications this morning.   Past Medical History:  Diagnosis Date   Arthritis    Chronic kidney disease    Complication of anesthesia    constipation and inability to  urinate happened a few days after cataract surgery    Diabetes ([place])    type 2    Diverticulitis    GERD (gastroesophageal reflux disease)    Gout    Heart attack ([place])    Heart disease    Heart murmur    Hyperglycemia    Hyperlipidemia    Hypertension    Osteoporosis    Pneumonia    hx of BOOP followed by Dr [name]    Sleep apnea    cpapp - setting at 17     Patient Active Problem List   Diagnosis Date Noted   Lumbar pain 01/30/2022   Chronic diastolic heart failure ([place]) 10/23/2021   AKI (acute kidney injury) ([place]) 09/17/2021   Stage 3b chronic kidney disease (CKD) ([place]) 09/17/2021   Acute coronary syndrome ([place]) 09/17/2021   Osteopenia 05/16/2021   Steroid dependent ([place]) 04/12/2021   Primary osteoarthritis of left knee 05/29/2020   Colon  cancer screening 02/13/2017   Poorly controlled type 2 diabetes mellitus with circulatory disorder ([place]) 08/13/2016   Chronic heel pain, left 08/12/2016   Upper airway cough syndrome 07/31/2016   Class 1 obesity with serious comorbidity and body mass index (BMI) of 32.0 to 32.9 in adult 05/19/2016   Routine general medical examination at a health care facility 01/27/2016   Pulmonary infiltrates with high  ESR c/w BOOP/ idiopathic  12/19/2015   Cough 11/29/2015   Cystic kidney disease 10/01/2015   Renal lesion 09/27/2015   Erectile dysfunction of organic origin 09/27/2015   BPH with obstruction/lower urinary tract symptoms 09/27/2015   Constipation 09/24/2015   Cyst of right kidney 09/24/2015   CAD (coronary artery disease) 06/08/2015   Grieving 11/22/2014   Encounter for Medicare annual wellness exam 07/11/2013   Prostate cancer screening 07/03/2013   Right bundle branch block 02/22/2013   Chronic low back pain 05/13/2011   Obstructive sleep apnea 03/15/2010   Hyperlipidemia associated with type 2 diabetes mellitus ([place]) 04/12/2009   Gout 04/12/2009   Essential hypertension 04/12/2009   Coronary artery disease of bypass graft of native heart with stable angina pectoris ([place]) 04/12/2009   GERD 04/12/2009   Renal insufficiency 04/12/2009   DIVERTICULITIS, HX  OF 04/12/2009     Physical Exam  Triage Vital Signs: ED Triage Vitals  Enc Vitals Group     BP 08/17/22 1723 101/65     Pulse Rate 08/17/22 1723 (!) 136     Resp 08/17/22 1723 18     Temp 08/17/22 1723 98.2 F (36.8 C)     Temp Source 08/17/22 1723 Oral     SpO2 08/17/22 1723 99 %     Weight 08/17/22 1728 242 lb (109.8 kg)     Height --      Head Circumference --      Peak Flow --      Pain Score --      Pain Loc --      Pain Edu? --      Excl. in [place]? --     Most recent vital signs: Vitals:   08/17/22 1723  BP: 101/65  Pulse: (!) 136  Resp: 18  Temp: 98.2 F (36.8 C)  SpO2: 99%     General: Awake, no  distress.  Dry mucous membranes CV:  Good peripheral perfusion.  Tachycardic, regular rhyth Resp:  Normal effort.  Abd:  No distention.  Tenderness to palpation in the left lower quadrant voluntary guarding Neuro:             Awake, Alert, Oriented x 3  Other:     ED Results / Procedures / Treatments  Labs (all labs ordered are listed, but only abnormal results are displayed) Labs Reviewed  LIPASE, BLOOD  COMPREHENSIVE METABOLIC PANEL  CBC  URINALYSIS, ROUTINE W REFLEX MICROSCOPIC     EKG  EKG reviewed and interpreted by myself shows sinus tachycardia with PACs left axis deviation right bundle branch block no acute ischemic changes   RADIOLOGY    PROCEDURES:  Critical Care performed: No  Procedures  The patient is on the cardiac monitor to evaluate for evidence of arrhythmia and/or significant heart rate changes.   MEDICATIONS ORDERED IN ED: Medications - No data to display   IMPRESSION / MDM / [place] / ED COURSE  I reviewed the triage vital signs and the nursing notes.                              Patient's presentation is most consistent with acute presentation with potential threat to life or bodily function.  Differential diagnosis includes, but is not limited to, viral illness, dehydration, electrolyte abnormality, AKI, diverticulitis, colitis  The patient is a 76 year old male who presents because of vomiting diarrhea generalized weakness for the last day and a half.  He is tachycardic on arrival EKG showing sinus tach with PACs apparently he did not take his metoprolol today and has not had much p.o. intake.  Has had 2-3 episodes of both vomiting and diarrhea nonbloody is denying abdominal pain to me but he does have significant left lower quadrant tenderness.  Has also had some cough congestion no dyspnea and episode of chest pain yesterday but no chest pain since.  He does look dry on exam with dry mucous membranes and he is tender in the left  lower quadrant on abdominal exam.  Plan to obtain CBC CMP give a bolus of fluid and his p.o. metoprolol as well as some Zofran.  Suspect that his tachycardia is with combination of dehydration and not taking his metoprolol.  Will also obtain a CT of the abdomen pelvis given the left lower  quadrant tenderness to rule out diverticulitis or other acute surgical process.       FINAL CLINICAL IMPRESSION(S) / ED DIAGNOSES   Final diagnoses:  None     Rx / DC Orders   ED Discharge Orders     None        Note:  This document was prepared using Dragon voice recognition software and may include unintentional dictation errors.

## 2022-08-17 NOTE — H&P (Incomplete)
History and Physical    Patient: Kurt Aguilar CBJ:[id number] DOB: Dec 13, 1945 DOA: 08/17/2022 DOS: the patient was seen and examined on 08/17/2022 PCP: [name], MD  Patient coming from: Home  Chief Complaint:  Chief Complaint  Patient presents with  . Weakness    HPI: Kurt Aguilar is a 76 y.o. male with medical history significant for CAD status post CABGx5 in 1997, IDDM 2, HTN, HLD, BOOP with chronic shortness of breath and on chronic steroids, CKD 3B, who presents to the ED with a 4-day history of nausea vomiting and diarrhea associated with poor oral intake and generalized weakness.  Emesis is nonbloody and nonbilious and stool is nonbloody and nonmelanotic.  His wife has similar symptoms.  He also has chills without fever.  He denies abdominal pain.  On the day prior he had anginal type pain which resolved with sublingual nitroglycerin and has not recurred.  He denies cough and his shortness of breath is at baseline. ED course and Data review: Patient had a low BP of 82/61, with pulse 136 O2 sat 99% on room air. Labs with WBC 14,800 with lactic acid 3.0-2.5.  Creatinine 3.04 up from baseline of 1.77 about 9 months prior with bicarb of 18, anion gap 16.  Blood glucose 184.  COVID and flu negative, urinalysis significant for glucosuria and mild ketonuria.  Troponin 84.  C. difficile and GI panel pending. EKG, personally viewed and interpreted showing sinus tachycardia at 137 with a right bundle branch block and no ischemic ST-T wave changes. Chest x-ray shows cardiomegaly with mild pulmonary vascular congestion.   CT abdomen and pelvis showing findings consistent with polycystic kidney disease but otherwise nonacute as detailed below: IMPRESSION: 1. No acute noncontrast CT findings of the abdomen or pelvis to explain left lower quadrant abdominal pain. Specifically, no evidence of diverticular disease or diverticulitis. 2. Innumerable bilateral renal cysts, some of which  are intrinsically hyperdense or with peripheral calcifications. No obvious solid mass by noncontrast CT. Findings are consistent with polycystic kidney disease. Solid renal lesion may be more confidently excluded by multiphasic contrast enhanced CT or MRI on a nonemergent, outpatient basis if desired. 3. Mild prostatomegaly. 4. Coronary artery disease. 5. Aortic valve calcifications. Correlate for echocardiographic evidence of aortic valve dysfunction.  Patient treated with 3 L LR bolus.  Hospitalist consulted for admission.   Review of Systems: As mentioned in the history of present illness. All other systems reviewed and are negative.  Past Medical History:  Diagnosis Date  . Arthritis   . Chronic kidney disease   . Complication of anesthesia    constipation and inability to  urinate happened a few days after cataract surgery   . Diabetes ([place])    type 2   . Diverticulitis   . GERD (gastroesophageal reflux disease)   . Gout   . Heart attack ([place])   . Heart disease   . Heart murmur   . Hyperglycemia   . Hyperlipidemia   . Hypertension   . Osteoporosis   . Pneumonia    hx of BOOP followed by Dr [name]   . Sleep apnea    cpapp - setting at 17    Past Surgical History:  Procedure Laterality Date  . ACROMIO-CLAVICULAR JOINT REPAIR Left 05/05/2019   Procedure: SHOULDER ARTHROSCOPIC ASSISTED ACROMIO-CLAVICULAR JOINT REPAIR;  Surgeon: Tania Ade, MD;  Location: [facility];  Service: Orthopedics;  Laterality: Left;  . CATARACT EXTRACTION Bilateral   . CORONARY ARTERY BYPASS GRAFT  [place]  . LEFT HEART CATH AND CORS/GRAFTS ANGIOGRAPHY N/A 09/19/2021   Procedure: LEFT HEART CATH AND CORS/GRAFTS ANGIOGRAPHY;  Surgeon: Belva Crome, MD;  Location: [facility];  Service: Cardiovascular;  Laterality: N/A;  . open heart surgery  09/09/1995-1998   5 bypass  . REPAIR KNEE LIGAMENT     right  . SHOULDER ARTHROSCOPY Left 05/05/2019   Procedure: ARTHROSCOPY  SHOULDER;  Surgeon: Tania Ade, MD;  Location: [facility];  Service: Orthopedics;  Laterality: Left;  . TONSILLECTOMY    . TOTAL KNEE ARTHROPLASTY Left 05/29/2020   Procedure: LEFT TOTAL KNEE ARTHROPLASTY;  Surgeon: Melrose Nakayama, MD;  Location: [facility];  Service: Orthopedics;  Laterality: Left;   Social History:  reports that he quit smoking about 17 years ago. His smoking use included cigars and cigarettes. He has a 3.00 pack-year smoking history. He has never used smokeless tobacco. He reports current alcohol use of about 1.0 standard drink of alcohol per week. He reports that he does not use drugs.  Allergies  Allergen Reactions  . Oxycodone-Acetaminophen Other (See Comments)    Stops breathing; has tolerated Tylenol before  . Oxycodone-Acetaminophen Anaphylaxis    Family History  Problem Relation Age of Onset  . Heart disease Mother   . Heart disease Father   . Stroke Father   . Heart attack Son 86  . Kidney disease Neg Hx   . Prostate cancer Neg Hx   . Diabetes Neg Hx     Prior to Admission medications   Medication Sig Start Date End Date Taking? Authorizing Provider  allopurinol (ZYLOPRIM) 100 MG tablet TAKE 1 TABLET BY MOUTH TWICE A DAY 04/14/22   Tower, [name], MD  aspirin EC (ASPIRIN 81) 81 MG tablet Take 81 mg by mouth daily. Swallow whole.    [provider]  benzonatate (TESSALON) 200 MG capsule Take 1 capsule (200 mg total) by mouth 3 (three) times daily as needed for cough. 09/24/21   Rai, Vernelle Emerald, MD  Calcium Carbonate-Vit D-Min (CALCIUM-VITAMIN D-MINERALS) 600-800 MG-UNIT CHEW Chew 1 tablet by mouth daily.    [provider]  colchicine 0.6 MG tablet TAKE 1 TABLET BY MOUTH TWICE A DAY WITH A MEAL AS NEEDED FOR ACUTE FLARE OF GOUT 03/16/18   Tower, [name], MD  Continuous Blood Gluc Receiver (FREESTYLE LIBRE 2 READER) DEVI USE AS INSTRUCTED TO CHECK BLOOD SUGARS. 01/13/22   Renato Shin, MD  Continuous Blood Gluc Sensor (FREESTYLE LIBRE 2 SENSOR)  MISC 1 Device by Does not apply route every 14 (fourteen) days. 12/06/21   Renato Shin, MD  empagliflozin (JARDIANCE) 10 MG TABS tablet Take 1 tablet (10 mg total) by mouth daily. 06/11/22   Philemon Kingdom, MD  ezetimibe (ZETIA) 10 MG tablet TAKE 1 TABLET BY MOUTH EVERY DAY 04/14/22   Tower, [name], MD  famotidine (PEPCID) 20 MG tablet TAKE 1 TABLET BY MOUTH EVERYDAY AT BEDTIME 06/18/22   Tanda Rockers, MD  furosemide (LASIX) 20 MG tablet TAKE 1 TABLET BY MOUTH EVERY DAY 08/13/22   Minna Merritts, MD  Glucosamine HCl (GLUCOSAMINE PO) Take 1,500 mg by mouth 2 (two) times daily.     [provider]  glucose blood (FREESTYLE LITE) test strip 1 each by Other route 4 (four) times daily. E11.65 04/10/20   Renato Shin, MD  insulin isophane & regular human KwikPen (NOVOLIN 70/30 KWIKPEN) (70-30) 100 UNIT/ML KwikPen Inject 30 Units into the skin daily with breakfast. And pen needles  1/day Patient taking differently: Inject 25 Units into the skin daily with breakfast. And pen needles 1/day 01/09/22   Renato Shin, MD  Insulin Pen Needle 31G X 8 MM MISC Use as instructed to inject insulin 1x daily 02/21/22   Philemon Kingdom, MD  isosorbide mononitrate (IMDUR) 30 MG 24 hr tablet Take 1 tablet by mouth daily.    [provider]  Lancets (FREESTYLE) lancets 1 EACH BY OTHER ROUTE 4 (FOUR) TIMES DAILY. E11.65 08/23/20   Renato Shin, MD  Lutein 20 MG CAPS Take 20 mg by mouth daily.     [provider]  metoprolol succinate (TOPROL-XL) 25 MG 24 hr tablet Take 0.5 tablets (12.5 mg total) by mouth at bedtime. 06/13/22   Minna Merritts, MD  nitroGLYCERIN (NITROSTAT) 0.4 MG SL tablet Place 1 tablet (0.4 mg total) under the tongue every 5 (five) minutes x 3 doses as needed for chest pain. 09/24/21   Rai, Ripudeep K, MD  omeprazole (PRILOSEC) 20 MG capsule TAKE 2 CAPSULES (40 MG TOTAL) BY MOUTH DAILY BEFORE BREAKFAST. 06/18/22   Tanda Rockers, MD  omeprazole (PRILOSEC) 40 MG capsule  TAKE 1 CAPUSLE BY MOUTH 30- 60 MIN BEFORE YOUR FIRST AND LAST MEALS OF THE DAY 09/13/21   Tanda Rockers, MD  ondansetron (ZOFRAN) 8 MG tablet Take 1 tablet (8 mg total) by mouth every 8 (eight) hours as needed for nausea or vomiting. From narcotic pain medicine 01/03/20   Tower, [name], MD  OZEMPIC, 2 MG/DOSE, 8 MG/3ML SOPN Inject 2 mg into the skin once a week. 05/09/22   [provider]  polyethylene glycol (MIRALAX / GLYCOLAX) 17 g packet Take 17 g by mouth daily.    [provider]  predniSONE (DELTASONE) 10 MG tablet Take prednisone 30 mg (3 tabs) for 3 days, then continue your maintenance dose of prednisone Patient taking differently: Take 10 mg by mouth daily with breakfast. 09/24/21   Rai, Ripudeep K, MD  rosuvastatin (CRESTOR) 40 MG tablet Take 1 tablet (40 mg total) by mouth daily. 04/03/22   [name], MD    Physical Exam: Vitals:   08/17/22 1916 08/17/22 2044 08/17/22 2210 08/17/22 2237  BP: 129/72 114/65 (!) 82/61 103/66  Pulse: (!) 119 (!) 110 (!) 101   Resp: '16 16 20   '$ Temp:      TempSrc:      SpO2: 98% 91% 91%   Weight:       Physical Exam  Labs on Admission: I have personally reviewed following labs and imaging studies  CBC: Recent Labs  Lab 08/17/22 1734  WBC 14.8*  HGB 16.3  HCT 51.9  MCV 98.5  PLT 740   Basic Metabolic Panel: Recent Labs  Lab 08/17/22 1755  NA 139  K 4.2  CL 105  CO2 18*  GLUCOSE 184*  BUN 57*  CREATININE 3.04*  CALCIUM 9.1   GFR: Estimated Creatinine Clearance: 25 mL/min (A) (by C-G formula based on SCr of 3.04 mg/dL (H)). Liver Function Tests: Recent Labs  Lab 08/17/22 1755  AST 33  ALT 26  ALKPHOS 50  BILITOT 1.3*  PROT 7.2  ALBUMIN 4.1   Recent Labs  Lab 08/17/22 1755  LIPASE 36   No results for input(s): "AMMONIA" in the last 168 hours. Coagulation Profile: No results for input(s): "INR", "PROTIME" in the last 168 hours. Cardiac Enzymes: No results for input(s): "CKTOTAL", "CKMB",  "CKMBINDEX", "TROPONINI" in the last 168 hours. BNP (last 3 results)  No results for input(s): "PROBNP" in the last 8760 hours. HbA1C: No results for input(s): "HGBA1C" in the last 72 hours. CBG: No results for input(s): "GLUCAP" in the last 168 hours. Lipid Profile: No results for input(s): "CHOL", "HDL", "LDLCALC", "TRIG", "CHOLHDL", "LDLDIRECT" in the last 72 hours. Thyroid Function Tests: No results for input(s): "TSH", "T4TOTAL", "FREET4", "T3FREE", "THYROIDAB" in the last 72 hours. Anemia Panel: No results for input(s): "VITAMINB12", "FOLATE", "FERRITIN", "TIBC", "IRON", "RETICCTPCT" in the last 72 hours. Urine analysis:    Component Value Date/Time   COLORURINE YELLOW (A) 08/17/2022 2131   APPEARANCEUR HAZY (A) 08/17/2022 2131   LABSPEC 1.015 08/17/2022 2131   PHURINE 5.0 08/17/2022 2131   GLUCOSEU >=500 (A) 08/17/2022 2131   GLUCOSEU NEGATIVE 11/25/2010 0951   HGBUR NEGATIVE 08/17/2022 2131   BILIRUBINUR NEGATIVE 08/17/2022 2131   BILIRUBINUR neg. 08/30/2012 1548   KETONESUR 5 (A) 08/17/2022 2131   PROTEINUR 100 (A) 08/17/2022 2131   UROBILINOGEN 0.2 08/30/2012 1548   UROBILINOGEN 0.2 11/25/2010 0951   NITRITE NEGATIVE 08/17/2022 2131   LEUKOCYTESUR NEGATIVE 08/17/2022 2131    Radiological Exams on Admission: DG Chest Portable 1 View  Result Date: 08/17/2022 CLINICAL DATA:  Chest pain. EXAM: PORTABLE CHEST 1 VIEW COMPARISON:  Chest radiograph dated December 18, 2021 FINDINGS: The heart is enlarged with evidence of prior coronary artery bypass grafting. Mild pulmonary vascular congestion. No evidence of pulmonary edema or large pleural effusion. Thoracic spondylosis. IMPRESSION: Cardiomegaly with mild pulmonary vascular congestion. No evidence of pulmonary edema or large pleural effusion. Electronically Signed   By: Keane Police D.O.   On: 08/17/2022 21:31   CT ABDOMEN PELVIS WO CONTRAST  Result Date: 08/17/2022 CLINICAL DATA:  Left lower quadrant abdominal pain, nausea  and vomiting since yesterday EXAM: CT ABDOMEN AND PELVIS WITHOUT CONTRAST TECHNIQUE: Multidetector CT imaging of the abdomen and pelvis was performed following the standard protocol without IV contrast. RADIATION DOSE REDUCTION: This exam was performed according to the departmental dose-optimization program which includes automated exposure control, adjustment of the mA and/or kV according to patient size and/or use of iterative reconstruction technique. COMPARISON:  MR abdomen, 05/07/2022, CT abdomen pelvis, 09/14/2015 FINDINGS: Lower chest: No acute abnormality. Aortic valve calcifications. Coronary artery calcifications. Scarring and or atelectasis of the bilateral lung bases. Hepatobiliary: No solid liver abnormality is seen. Multiple small fluid attenuation cysts and additional subcentimeter low-attenuation lesions too small to characterize although most likely simple cysts, in general better characterized by prior MR and contrast enhanced CT and benign. No further follow-up or characterization is required. No gallstones, gallbladder wall thickening, or biliary dilatation. Pancreas: Unremarkable. No pancreatic ductal dilatation or surrounding inflammatory changes. Spleen: Normal in size without significant abnormality. Adrenals/Urinary Tract: Adrenal glands are unremarkable. Innumerable bilateral renal cysts, some of which are intrinsically hyperdense or with peripheral calcifications. No obvious solid mass noncontrast CT. No calculi or hydronephrosis. Bladder is unremarkable. Stomach/Bowel: Stomach is within normal limits. Appendix appears normal. No evidence of bowel wall thickening, distention, or inflammatory changes. Vascular/Lymphatic: Aortic atherosclerosis. No enlarged abdominal or pelvic lymph nodes. Reproductive: Mild prostatomegaly. Other: Small, fat containing bilateral inguinal hernias. No ascites. Musculoskeletal: No acute or significant osseous findings. Disc degenerative disease and bridging  osteophytosis throughout the lower thoracic and upper lumbar spine in keeping with DISH. IMPRESSION: 1. No acute noncontrast CT findings of the abdomen or pelvis to explain left lower quadrant abdominal pain. Specifically, no evidence of diverticular disease or diverticulitis. 2. Innumerable bilateral renal cysts, some of which are intrinsically  hyperdense or with peripheral calcifications. No obvious solid mass by noncontrast CT. Findings are consistent with polycystic kidney disease. Solid renal lesion may be more confidently excluded by multiphasic contrast enhanced CT or MRI on a nonemergent, outpatient basis if desired. 3. Mild prostatomegaly. 4. Coronary artery disease. 5. Aortic valve calcifications. Correlate for echocardiographic evidence of aortic valve dysfunction. Aortic Atherosclerosis (ICD10-I70.0). Electronically Signed   By: Delanna Ahmadi M.D.   On: 08/17/2022 20:00     Data Reviewed: Relevant notes from primary care and specialist visits, past discharge summaries as available in EHR, including Care Everywhere. Prior diagnostic testing as pertinent to current admission diagnoses Updated medications and problem lists for reconciliation ED course, including vitals, labs, imaging, treatment and response to treatment Triage notes, nursing and pharmacy notes and ED provider's notes Notable results as noted in HPI   Assessment and Plan: No notes have been filed under this hospital service. Service: Hospitalist       DVT prophylaxis: Lovenox***  Consults: none***  Advance Care Planning:   Code Status: Prior ***  Family Communication: none***  Disposition Plan: Back to previous home environment  Severity of Illness: {Observation/Inpatient:21159}  Author: Athena Masse, MD 08/17/2022 11:31 PM  For on call review [email or web address].

## 2022-08-17 NOTE — ED Triage Notes (Signed)
Pt [facility]. Pt sts that he has been having N/V since yesterday. Pt is also having weakness

## 2022-08-18 ENCOUNTER — Other Ambulatory Visit: Payer: Self-pay

## 2022-08-18 ENCOUNTER — Ambulatory Visit: Payer: PPO | Admitting: Dermatology

## 2022-08-18 ENCOUNTER — Encounter: Payer: Self-pay | Admitting: Internal Medicine

## 2022-08-18 DIAGNOSIS — A419 Sepsis, unspecified organism: Secondary | ICD-10-CM

## 2022-08-18 DIAGNOSIS — N1832 Chronic kidney disease, stage 3b: Secondary | ICD-10-CM

## 2022-08-18 DIAGNOSIS — R6520 Severe sepsis without septic shock: Secondary | ICD-10-CM | POA: Diagnosis not present

## 2022-08-18 DIAGNOSIS — N179 Acute kidney failure, unspecified: Secondary | ICD-10-CM

## 2022-08-18 DIAGNOSIS — K529 Noninfective gastroenteritis and colitis, unspecified: Secondary | ICD-10-CM | POA: Diagnosis not present

## 2022-08-18 LAB — COMPREHENSIVE METABOLIC PANEL
ALT: 18 U/L (ref 0–44)
AST: 21 U/L (ref 15–41)
Albumin: 3.2 g/dL — ABNORMAL LOW (ref 3.5–5.0)
Alkaline Phosphatase: 35 U/L — ABNORMAL LOW (ref 38–126)
Anion gap: 10 (ref 5–15)
BUN: 57 mg/dL — ABNORMAL HIGH (ref 8–23)
CO2: 21 mmol/L — ABNORMAL LOW (ref 22–32)
Calcium: 8.1 mg/dL — ABNORMAL LOW (ref 8.9–10.3)
Chloride: 109 mmol/L (ref 98–111)
Creatinine, Ser: 3.1 mg/dL — ABNORMAL HIGH (ref 0.61–1.24)
GFR, Estimated: 20 mL/min — ABNORMAL LOW (ref >60)
Glucose, Bld: 137 mg/dL — ABNORMAL HIGH (ref 70–99)
Potassium: 3.6 mmol/L (ref 3.5–5.1)
Sodium: 140 mmol/L (ref 135–145)
Total Bilirubin: 1.1 mg/dL (ref 0.3–1.2)
Total Protein: 5.4 g/dL — ABNORMAL LOW (ref 6.5–8.1)

## 2022-08-18 LAB — CREATININE, SERUM
Creatinine, Ser: 3.08 mg/dL — ABNORMAL HIGH (ref 0.61–1.24)
GFR, Estimated: 20 mL/min — ABNORMAL LOW (ref >60)

## 2022-08-18 LAB — CBG MONITORING, ED
Glucose-Capillary: 126 mg/dL — ABNORMAL HIGH (ref 70–99)
Glucose-Capillary: 165 mg/dL — ABNORMAL HIGH (ref 70–99)
Glucose-Capillary: 130 mg/dL — ABNORMAL HIGH (ref 70–99)
Glucose-Capillary: 212 mg/dL — ABNORMAL HIGH (ref 70–99)

## 2022-08-18 LAB — GLUCOSE, CAPILLARY: Glucose-Capillary: 147 mg/dL — ABNORMAL HIGH (ref 70–99)

## 2022-08-18 LAB — LACTIC ACID, PLASMA
Lactic Acid, Venous: 1.4 mmol/L (ref 0.5–1.9)
Lactic Acid, Venous: 2.1 mmol/L (ref 0.5–1.9)

## 2022-08-18 LAB — CBC
HCT: 40.5 % (ref 39.0–52.0)
Hemoglobin: 13.1 g/dL (ref 13.0–17.0)
MCH: 31.3 pg (ref 26.0–34.0)
MCHC: 32.3 g/dL (ref 30.0–36.0)
MCV: 96.7 fL (ref 80.0–100.0)
Platelets: 185 10*3/uL (ref 150–400)
RBC: 4.19 MIL/uL — ABNORMAL LOW (ref 4.22–5.81)
RDW: 15 % (ref 11.5–15.5)
WBC: 9.5 10*3/uL (ref 4.0–10.5)
nRBC: 0.4 % — ABNORMAL HIGH (ref 0.0–0.2)

## 2022-08-18 MED ORDER — ENOXAPARIN SODIUM 40 MG/0.4ML IJ SOSY
40.0000 mg | PREFILLED_SYRINGE | INTRAMUSCULAR | Status: DC
  Administered 2022-08-18 – 2022-08-19 (×2): 40 mg via SUBCUTANEOUS
  Filled 2022-08-18 (×2): qty 0.4

## 2022-08-18 MED ORDER — LACTATED RINGERS IV BOLUS
500.0000 mL | Freq: Once | INTRAVENOUS | Status: AC
  Administered 2022-08-18: 500 mL via INTRAVENOUS

## 2022-08-18 MED ORDER — LACTATED RINGERS IV SOLN: INTRAVENOUS | Status: AC

## 2022-08-18 MED ORDER — METOPROLOL SUCCINATE ER 25 MG PO TB24
12.5000 mg | ORAL_TABLET | Freq: Every day | ORAL | Status: DC
  Administered 2022-08-18: 12.5 mg via ORAL
  Filled 2022-08-18: qty 1

## 2022-08-18 MED ORDER — NITROGLYCERIN 0.4 MG SL SUBL: 0.4000 mg | SUBLINGUAL_TABLET | SUBLINGUAL | Status: DC | PRN

## 2022-08-18 MED ORDER — EZETIMIBE 10 MG PO TABS
10.0000 mg | ORAL_TABLET | Freq: Every day | ORAL | Status: DC
  Administered 2022-08-18 – 2022-08-19 (×2): 10 mg via ORAL
  Filled 2022-08-18 (×2): qty 1

## 2022-08-18 MED ORDER — LACTATED RINGERS IV SOLN: INTRAVENOUS | Status: DC

## 2022-08-18 MED ORDER — ONDANSETRON HCL 4 MG PO TABS: 4.0000 mg | ORAL_TABLET | Freq: Four times a day (QID) | ORAL | Status: DC | PRN

## 2022-08-18 MED ORDER — PREDNISONE 10 MG PO TABS
10.0000 mg | ORAL_TABLET | Freq: Every day | ORAL | Status: DC
  Administered 2022-08-18 – 2022-08-19 (×2): 10 mg via ORAL
  Filled 2022-08-18 (×2): qty 1

## 2022-08-18 MED ORDER — ACETAMINOPHEN 325 MG PO TABS: 650.0000 mg | ORAL_TABLET | Freq: Four times a day (QID) | ORAL | Status: DC | PRN

## 2022-08-18 MED ORDER — INSULIN ASPART 100 UNIT/ML IJ SOLN: 0.0000 [IU] | Freq: Every day | INTRAMUSCULAR | Status: DC

## 2022-08-18 MED ORDER — ASPIRIN 81 MG PO TBEC
81.0000 mg | DELAYED_RELEASE_TABLET | Freq: Every day | ORAL | Status: DC
  Administered 2022-08-18 – 2022-08-19 (×2): 81 mg via ORAL
  Filled 2022-08-18 (×2): qty 1

## 2022-08-18 MED ORDER — ACETAMINOPHEN 650 MG RE SUPP: 650.0000 mg | Freq: Four times a day (QID) | RECTAL | Status: DC | PRN

## 2022-08-18 MED ORDER — ONDANSETRON HCL 4 MG/2ML IJ SOLN: 4.0000 mg | Freq: Four times a day (QID) | INTRAMUSCULAR | Status: DC | PRN

## 2022-08-18 MED ORDER — INSULIN ASPART 100 UNIT/ML IJ SOLN
0.0000 [IU] | Freq: Three times a day (TID) | INTRAMUSCULAR | Status: DC
  Administered 2022-08-18: 5 [IU] via SUBCUTANEOUS
  Administered 2022-08-18: 3 [IU] via SUBCUTANEOUS
  Administered 2022-08-18 – 2022-08-19 (×2): 2 [IU] via SUBCUTANEOUS
  Filled 2022-08-18 (×4): qty 1

## 2022-08-18 MED ORDER — ROSUVASTATIN CALCIUM 20 MG PO TABS
40.0000 mg | ORAL_TABLET | Freq: Every day | ORAL | Status: DC
  Administered 2022-08-18 – 2022-08-19 (×2): 40 mg via ORAL
  Filled 2022-08-18 (×3): qty 2

## 2022-08-18 MED ORDER — EMPAGLIFLOZIN 10 MG PO TABS
10.0000 mg | ORAL_TABLET | Freq: Every day | ORAL | Status: DC
  Administered 2022-08-18 – 2022-08-19 (×2): 10 mg via ORAL
  Filled 2022-08-18 (×2): qty 1

## 2022-08-18 NOTE — Assessment & Plan Note (Addendum)
Severe Sepsis  At high risk for severe infection due to chronic prednisone therapy Patient presents with a 4-day history of nausea, vomiting and diarrhea, hypotensive and tachycardic with leukocytosis and lactic acidosis and AKI Sepsis fluids Suspect viral etiology to gastro enteritis so holding off on antibiotics pending stool studies Follow-up GI panel and stool for C. difficile

## 2022-08-18 NOTE — Sepsis Progress Note (Signed)
Elink Following for Sepsis Protocol

## 2022-08-18 NOTE — Assessment & Plan Note (Signed)
Euvolemic to dry Monitor for fluid overload in view of IV fluid resuscitation for management of sepsis Continue metoprolol, Lasix, Imdur, with monitoring for hypotension with these meds in the setting of sepsis Daily weights

## 2022-08-18 NOTE — Progress Notes (Signed)
Pt states that he wishes to be a DNR. Pt has out of facility DNR which came up with him from ED department. MD notified of pts wishes. Awaiting orders.

## 2022-08-18 NOTE — Assessment & Plan Note (Signed)
Secondary to dehydration and sepsis Creatinine was 3.04 from baseline of 1.77 IV hydration and avoid nephrotoxins

## 2022-08-18 NOTE — Progress Notes (Signed)
       CROSS COVER NOTE  NAME: GERRAD POST MRN: [id number] DOB : Aug 27, 1946 ATTENDING PHYSICIAN: Lurene Shadow, MD    Date of Service   08/18/2022   HPI/Events of Note   Notified of elevated Lactic--> 2.1. Mr Urgiles is admitted with severe sepsis secondary to gastroenteritis.  Interventions   Assessment/Plan: 500 mL bolus Repeat Lactic--> 1.5     This document was prepared using Dragon voice recognition software and may include unintentional dictation errors.  Bishop Limbo DNP, MBA, FNP-BC Nurse Practitioner [facility] Pager [phone]

## 2022-08-18 NOTE — Assessment & Plan Note (Signed)
BP somewhat soft.  Continue home antihypertensives with hold parameters

## 2022-08-18 NOTE — Assessment & Plan Note (Addendum)
Chronic immunosuppression with prednisone History of Boop, followed by pulmonology No suspected acute issues at this time

## 2022-08-18 NOTE — Assessment & Plan Note (Signed)
CPAP.  

## 2022-08-18 NOTE — ED Notes (Signed)
Pt is sleeping

## 2022-08-18 NOTE — Assessment & Plan Note (Signed)
CT showing polycystic kidney disease, known

## 2022-08-18 NOTE — ED Notes (Signed)
Pt transferred from Main ED to [facility]. Pt in no apparent distress will continue plan of care.

## 2022-08-18 NOTE — Progress Notes (Addendum)
Progress Note    Kurt Aguilar  XKP:[id number] DOB: [birth year]  DOA: 08/17/2022 PCP: [name], MD      Brief Narrative:    Medical records reviewed and are as summarized below:  Kurt Aguilar is a 76 y.o. male  with medical history significant for CAD status post CABGx5 in 1997, IDDM 2, HTN, HLD, BOOP with chronic shortness of breath and on chronic steroids, CKD 3B, who presents to the ED with a 4-day history of nausea vomiting and diarrhea associated with poor oral intake and generalized weakness.    Lactic acid was 3 and WBC was 14.8.  He was admitted to the hospital for severe sepsis secondary to gastroenteritis.    Assessment/Plan:   Principal Problem:   Severe sepsis (HCC) Active Problems:   Acute gastroenteritis   Acute renal failure superimposed on stage 3b chronic kidney disease (HCC)   Chronic diastolic heart failure (HCC)   Pulmonary infiltrates with high  ESR c/w BOOP/ idiopathic    Steroid dependent ([place])   Cystic kidney disease   Obstructive sleep apnea   Essential hypertension   Coronary artery disease of bypass graft of native heart with stable angina pectoris (HCC)   Right bundle branch block    Body mass index is 34.48 kg/m.  (Obesity)  Severe sepsis secondary to gastroenteritis: Continue maintenance IV fluids.  Stool for GI panel if diarrhea recurs.  Analgesics as needed for pain.  AKI on CKD stage IIIb, polycystic kidney disease: Continue IV fluids.  Chronic diastolic CHF: Compensated.  Continue metoprolol.  Lasix on hold.  Elevated troponin (likely due to demand ischemia.  BOOP on chronic prednisone  Other comorbidities include CAD, hypertension, OSA on CPAP      Diet Order             Diet heart healthy/carb modified Room service appropriate? Yes; Fluid consistency: Thin  Diet effective now                            Consultants: None  Procedures: None     Medications:    aspirin EC  81 mg  Oral Daily   empagliflozin  10 mg Oral Daily   enoxaparin (LOVENOX) injection  40 mg Subcutaneous Q24H   ezetimibe  10 mg Oral Daily   insulin aspart  0-15 Units Subcutaneous TID WC   insulin aspart  0-5 Units Subcutaneous QHS   metoprolol succinate  12.5 mg Oral QHS   predniSONE  10 mg Oral Q breakfast   rosuvastatin  40 mg Oral Daily   Continuous Infusions:  lactated ringers       Anti-infectives (From admission, onward)    None              Family Communication/Anticipated D/C date and plan/Code Status   DVT prophylaxis: enoxaparin (LOVENOX) injection 40 mg Start: 08/18/22 0800     Code Status: Full Code  Family Communication: His wife at the bedside Disposition Plan: Plan to discharge home tomorrow   Status is: Inpatient Remains inpatient appropriate because: Severe sepsis       Subjective:   Interval events noted.  He has some mild abdominal pain.  No vomiting or diarrhea.  His wife is at the bedside  Objective:    Vitals:   08/18/22 0530 08/18/22 0600 08/18/22 0630 08/18/22 0800  BP: 107/67 114/66 103/62 120/70  Pulse: 76 67 72 84  Resp: (!) 26 (!)  _0 Temp:    98.2 F (36.8 C)  TempSrc:    Oral  SpO2: 99% 99% 99% 95%  Weight:       No data found.  No intake or output data in the 24 hours ending 08/18/22 1124 Filed Weights   08/17/22 1728  Weight: 109.8 kg    Exam:  GEN: NAD SKIN: Warm and dry EYES: No pallor or icterus ENT: MMM CV: RRR PULM: CTA B ABD: soft, obese, mid abdominal tenderness without rebound tenderness or guarding, +BS CNS: AAO x 3, non focal EXT: No edema or tenderness        Data Reviewed:   I have personally reviewed following labs and imaging studies:  Labs: Labs show the following:   Basic Metabolic Panel: Recent Labs  Lab 08/17/22 1755 08/18/22 0233  NA 139 140  K 4.2 3.6  CL 105 109  CO2 18* 21*  GLUCOSE 184* 137*  BUN 57* 57*  CREATININE 3.04* 3.08*  3.10*  CALCIUM 9.1  8.1*   GFR Estimated Creatinine Clearance: 24.6 mL/min (A) (by C-G formula based on SCr of 3.08 mg/dL (H)). Liver Function Tests: Recent Labs  Lab 08/17/22 1755 08/18/22 0233  AST 33 21  ALT 26 18  ALKPHOS 50 35*  BILITOT 1.3* 1.1  PROT 7.2 5.4*  ALBUMIN 4.1 3.2*   Recent Labs  Lab 08/17/22 1755  LIPASE 36   No results for input(s): "AMMONIA" in the last 168 hours. Coagulation profile No results for input(s): "INR", "PROTIME" in the last 168 hours.  CBC: Recent Labs  Lab 08/17/22 1734 08/18/22 0233  WBC 14.8* 9.5  HGB 16.3 13.1  HCT 51.9 40.5  MCV 98.5 96.7  PLT 223 185   Cardiac Enzymes: No results for input(s): "CKTOTAL", "CKMB", "CKMBINDEX", "TROPONINI" in the last 168 hours. BNP (last 3 results) No results for input(s): "PROBNP" in the last 8760 hours. CBG: Recent Labs  Lab 08/18/22 0249 08/18/22 0743  GLUCAP 126* 165*   D-Dimer: No results for input(s): "DDIMER" in the last 72 hours. Hgb A1c: No results for input(s): "HGBA1C" in the last 72 hours. Lipid Profile: No results for input(s): "CHOL", "HDL", "LDLCALC", "TRIG", "CHOLHDL", "LDLDIRECT" in the last 72 hours. Thyroid function studies: No results for input(s): "TSH", "T4TOTAL", "T3FREE", "THYROIDAB" in the last 72 hours.  Invalid input(s): "FREET3" Anemia work up: No results for input(s): "VITAMINB12", "FOLATE", "FERRITIN", "TIBC", "IRON", "RETICCTPCT" in the last 72 hours. Sepsis Labs: Recent Labs  Lab 08/17/22 1734 08/17/22 2003 08/17/22 2131 08/18/22 0233  WBC 14.8*  --   --  9.5  LATICACIDVEN  --  3.0* 2.5* 1.4    Microbiology Recent Results (from the past 240 hour(s))  Resp Panel by RT-PCR (Flu A&B, Covid) Anterior Nasal Swab     Status: None   Collection Time: 08/17/22  5:55 PM   Specimen: Anterior Nasal Swab  Result Value Ref Range Status   SARS Coronavirus 2 by RT PCR NEGATIVE NEGATIVE Final    Comment: (NOTE) SARS-CoV-2 target nucleic acids are NOT DETECTED.  The  SARS-CoV-2 RNA is generally detectable in upper respiratory specimens during the acute phase of infection. The lowest concentration of SARS-CoV-2 viral copies this assay can detect is 138 copies/mL. A negative result does not preclude SARS-Cov-2 infection and should not be used as the sole basis for treatment or other patient management decisions. A negative result may occur with  improper specimen collection/handling, submission of specimen other than nasopharyngeal swab, presence of  viral mutation(s) within the areas targeted by this assay, and inadequate number of viral copies(<138 copies/mL). A negative result must be combined with clinical observations, patient history, and epidemiological information. The expected result is Negative.  Fact Sheet for Patients:  EntrepreneurPulse.com.au  Fact Sheet for Healthcare Providers:  IncredibleEmployment.be  This test is no t yet approved or cleared by the [place] FDA and  has been authorized for detection and/or diagnosis of SARS-CoV-2 by FDA under an Emergency Use Authorization (EUA). This EUA will remain  in effect (meaning this test can be used) for the duration of the COVID-19 declaration under Section 564(b)(1) of the Act, 21 U.S.C.section 360bbb-3(b)(1), unless the authorization is terminated  or revoked sooner.       Influenza A by PCR NEGATIVE NEGATIVE Final   Influenza B by PCR NEGATIVE NEGATIVE Final    Comment: (NOTE) The Xpert Xpress SARS-CoV-2/FLU/RSV plus assay is intended as an aid in the diagnosis of influenza from Nasopharyngeal swab specimens and should not be used as a sole basis for treatment. Nasal washings and aspirates are unacceptable for Xpert Xpress SARS-CoV-2/FLU/RSV testing.  Fact Sheet for Patients: EntrepreneurPulse.com.au  Fact Sheet for Healthcare Providers: IncredibleEmployment.be  This test is not yet approved or  cleared by the [place] FDA and has been authorized for detection and/or diagnosis of SARS-CoV-2 by FDA under an Emergency Use Authorization (EUA). This EUA will remain in effect (meaning this test can be used) for the duration of the COVID-19 declaration under Section 564(b)(1) of the Act, 21 U.S.C. section 360bbb-3(b)(1), unless the authorization is terminated or revoked.  Performed at [facility], [address] [place], [place] [address]   Blood culture (routine x 2)     Status: None (Preliminary result)   Collection Time: 08/17/22  9:31 PM   Specimen: BLOOD  Result Value Ref Range Status   Specimen Description BLOOD RIGHT ANTECUBITAL  Final   Special Requests   Final    Blood Culture results may not be optimal due to an excessive volume of blood received in culture bottles   Culture   Final    NO GROWTH < 12 HOURS Performed at [facility], [address], [place], [place] [address]    Report Status PENDING  Incomplete  Blood culture (routine x 2)     Status: None (Preliminary result)   Collection Time: 08/17/22  9:31 PM   Specimen: BLOOD  Result Value Ref Range Status   Specimen Description BLOOD RIGHT ANTECUBITAL  Final   Special Requests   Final    Blood Culture results may not be optimal due to an excessive volume of blood received in culture bottles   Culture   Final    NO GROWTH < 12 HOURS Performed at [facility], [address], [place], [place] [address]    Report Status PENDING  Incomplete    Procedures and diagnostic studies:  DG Chest Portable 1 View  Result Date: 08/17/2022 CLINICAL DATA:  Chest pain. EXAM: PORTABLE CHEST 1 VIEW COMPARISON:  Chest radiograph dated December 18, 2021 FINDINGS: The heart is enlarged with evidence of prior coronary artery bypass grafting. Mild pulmonary vascular congestion. No evidence of pulmonary edema or large pleural effusion. Thoracic spondylosis. IMPRESSION: Cardiomegaly with mild  pulmonary vascular congestion. No evidence of pulmonary edema or large pleural effusion. Electronically Signed   By: Keane Police D.O.   On: 08/17/2022 21:31   CT ABDOMEN PELVIS WO CONTRAST  Result Date: 08/17/2022 CLINICAL DATA:  Left lower quadrant abdominal pain, nausea and vomiting since yesterday EXAM: CT ABDOMEN AND PELVIS WITHOUT CONTRAST TECHNIQUE: Multidetector CT imaging of the abdomen and pelvis was performed following the standard protocol without IV contrast. RADIATION DOSE REDUCTION: This exam was performed according to the departmental dose-optimization program which includes automated exposure control, adjustment of the mA and/or kV according to patient size and/or use of iterative reconstruction technique. COMPARISON:  MR abdomen, 05/07/2022, CT abdomen pelvis, 09/14/2015 FINDINGS: Lower chest: No acute abnormality. Aortic valve calcifications. Coronary artery calcifications. Scarring and or atelectasis of the bilateral lung bases. Hepatobiliary: No solid liver abnormality is seen. Multiple small fluid attenuation cysts and additional subcentimeter low-attenuation lesions too small to characterize although most likely simple cysts, in general better characterized by prior MR and contrast enhanced CT and benign. No further follow-up or characterization is required. No gallstones, gallbladder wall thickening, or biliary dilatation. Pancreas: Unremarkable. No pancreatic ductal dilatation or surrounding inflammatory changes. Spleen: Normal in size without significant abnormality. Adrenals/Urinary Tract: Adrenal glands are unremarkable. Innumerable bilateral renal cysts, some of which are intrinsically hyperdense or with peripheral calcifications. No obvious solid mass noncontrast CT. No calculi or hydronephrosis. Bladder is unremarkable. Stomach/Bowel: Stomach is within normal limits. Appendix appears normal. No evidence of bowel wall thickening, distention, or inflammatory changes.  Vascular/Lymphatic: Aortic atherosclerosis. No enlarged abdominal or pelvic lymph nodes. Reproductive: Mild prostatomegaly. Other: Small, fat containing bilateral inguinal hernias. No ascites. Musculoskeletal: No acute or significant osseous findings. Disc degenerative disease and bridging osteophytosis throughout the lower thoracic and upper lumbar spine in keeping with DISH. IMPRESSION: 1. No acute noncontrast CT findings of the abdomen or pelvis to explain left lower quadrant abdominal pain. Specifically, no evidence of diverticular disease or diverticulitis. 2. Innumerable bilateral renal cysts, some of which are intrinsically hyperdense or with peripheral calcifications. No obvious solid mass by noncontrast CT. Findings are consistent with polycystic kidney disease. Solid renal lesion may be more confidently excluded by multiphasic contrast enhanced CT or MRI on a nonemergent, outpatient basis if desired. 3. Mild prostatomegaly. 4. Coronary artery disease. 5. Aortic valve calcifications. Correlate for echocardiographic evidence of aortic valve dysfunction. Aortic Atherosclerosis (ICD10-I70.0). Electronically Signed   By: Delanna Ahmadi M.D.   On: 08/17/2022 20:00               LOS: 1 day   Donnisha Besecker  [facility]   Pager on [email or web address]. If 7PM-7AM, please contact night-coverage at [email or web address]     08/18/2022, 11:24 AM

## 2022-08-18 NOTE — Assessment & Plan Note (Signed)
Elevated troponin Troponin elevated at 84, EKG with known RBBB Patient did have an episode of chest pain the day prior to arrival which resolved with a single sublingual nitroglycerin Continue metoprolol, aspirin, isosorbide and rosuvastatin.  NTG as needed chest pain Continue to trend troponin

## 2022-08-19 ENCOUNTER — Ambulatory Visit: Payer: PPO | Admitting: Internal Medicine

## 2022-08-19 DIAGNOSIS — R6520 Severe sepsis without septic shock: Secondary | ICD-10-CM | POA: Diagnosis not present

## 2022-08-19 DIAGNOSIS — A419 Sepsis, unspecified organism: Secondary | ICD-10-CM | POA: Diagnosis not present

## 2022-08-19 DIAGNOSIS — K529 Noninfective gastroenteritis and colitis, unspecified: Secondary | ICD-10-CM | POA: Diagnosis not present

## 2022-08-19 LAB — BASIC METABOLIC PANEL
Anion gap: 7 (ref 5–15)
BUN: 44 mg/dL — ABNORMAL HIGH (ref 8–23)
CO2: 23 mmol/L (ref 22–32)
Calcium: 8.2 mg/dL — ABNORMAL LOW (ref 8.9–10.3)
Chloride: 112 mmol/L — ABNORMAL HIGH (ref 98–111)
Creatinine, Ser: 2.42 mg/dL — ABNORMAL HIGH (ref 0.61–1.24)
GFR, Estimated: 27 mL/min — ABNORMAL LOW (ref >60)
Glucose, Bld: 135 mg/dL — ABNORMAL HIGH (ref 70–99)
Potassium: 3.7 mmol/L (ref 3.5–5.1)
Sodium: 142 mmol/L (ref 135–145)

## 2022-08-19 LAB — GLUCOSE, CAPILLARY
Glucose-Capillary: 209 mg/dL — ABNORMAL HIGH (ref 70–99)
Glucose-Capillary: 129 mg/dL — ABNORMAL HIGH (ref 70–99)
Glucose-Capillary: 207 mg/dL — ABNORMAL HIGH (ref 70–99)

## 2022-08-19 LAB — LACTIC ACID, PLASMA: Lactic Acid, Venous: 1.5 mmol/L (ref 0.5–1.9)

## 2022-08-19 MED ORDER — NOVOLIN 70/30 FLEXPEN (70-30) 100 UNIT/ML ~~LOC~~ SUPN: 40.0000 [IU] | PEN_INJECTOR | Freq: Every day | SUBCUTANEOUS | Status: DC

## 2022-08-19 MED ORDER — PREDNISONE 10 MG PO TABS: 10.0000 mg | ORAL_TABLET | Freq: Every day | ORAL | 0 refills | Status: DC

## 2022-08-19 MED ORDER — COLCHICINE 0.6 MG PO TABS: 0.6000 mg | ORAL_TABLET | ORAL | Status: DC | PRN

## 2022-08-19 NOTE — Progress Notes (Signed)
Lab called with elevated Lactic Acid results of 2.1. Katy Foust,NP notified and new orders received. Will continue to monitor

## 2022-08-19 NOTE — Progress Notes (Deleted)
Subjective:   Patient ID: Kurt Aguilar, male    DOB: [birth year]  MRN: [id number]    Brief patient profile:  76 yowm never cigarette smoker (just some cigars)  after cabg in [place] started noting recurrent winter cough regardless of where located in [place] ([place]/ [place]) with typical acute episode in [place] in Feb 2017  This  cough  more dry less severe than previous >  abx and pred > improved but did not resolve and worse when stopped it so restarted pred/abx > dx as pneumonia with variable as dz > referred to pulmonary clinic 12/19/2015 by [name] with 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   History of Present Illness  12/19/2015 1st [place] [facility] office visit/ Areon Cocuzza   Chief Complaint  Patient presents with   Pulmonary Consult    Referred by [name]. Pt c/o cough x 4 wks- non prod and worse in the evening and when he lies down. Talking and exertion are things that trigger the cough. He has been on pred x 2 and both times cough resolved and immediately returns once done with med. He also c/o SOB- gets winded just walking from room to room at home.   while on prednisone still some weak and sob but better cough and last dose at least one week and worse overall since off it assoc with sob room to room and weakness/ excess/ purulent sputum or mucus plugs / no unusual exp / no ctd.  Has new CPAP machine fall 2016  rec  Omeprazole 20 mg Take 30- 60 min before your first and last meals of the day  Prednisone 10  X 2 each day until 100% better then 10 mg daily  Dx is Brochilitis obliterans with organizing pneumonia vs eosiniphic pneumonia most likely diagnosis GERD  Diet    09/11/2016  f/u ov/Kammy Klett re: BOOP/uacs  on pred 10/5  prilosec 20 mg ac and hs  Chief Complaint  Patient presents with   Follow-up    Increased SOB for the past 3-4 days. He gets SOB with exertion such as getting dressed or walking accross the room.  He has also started coughing more "feels like I burned my  lungs"- prod with white sputum.     was doing ok on 10/5 in terms of breathing but worse gradually x one week p several weeks on the 10/5 dose  Cough some better but still needing freq tessalon on gabapentin 100 tid  rec Prednisone 10 mg  2 daily until better then 1 daily x 2 weeks then 10 alternating with 5 until return Prilosec should be 20 mg Take 30- 60 min before your first and last meals of the day  Increase gabapentin to 300 mg three times daily         Symptoms onset Dec 4th 2022  [place] 08/26/2021  acute visit  Prometh codeine    cough syrup Benzonatate perles- for cough Molnupiravir rx  and zpak  >>>  walking room to room at home/ cough never improved and pred rx up to 40 mg   Admit date:     09/17/2021   Discharge date: 09/24/21  Discharge Physician: [name]     Discharge Diagnoses     Acute coronary syndrome / NSTEMI(HCC) Acute kidney injury on CKD stage IIIb Lactic acidosis Shortness of breath with coughing   Essential hypertension   Right bundle branch block   CAD (coronary artery disease)   DM2 (diabetes mellitus, type 2) (  [place])   Steroid dependent (HCC)   AKI (acute kidney injury) ([place])   Stage 3b chronic kidney disease (CKD) [facility])         Hospital Course     Patient is a 76 year old male with history of CAD status post CABG in 1997, DM2, HTN, HLD, Boop, recent COVID-19 infection in December 2022, chronic steroid dependency presented to ED with mild intermittent chest pain for a month, worse with exertion, dyspnea.  Patient was found to have elevated troponins and was admitted for further work-up    NSTEMI [facility]) with known history of CAD status post CABG x5 in 1997 -2D echo showed no wall motion abnormality, normal EF, likely demand ischemia -Left heart cardiac cath showed multivessel native coronary artery diseasesaphenous vein to right coronary artery with 70% ostial to proximal stenosis, SVG to OM 1 and 2 patent, SVG to first diagonal patent,  LIMA to LAD patent.  -Continue aspirin, beta-blocker, Crestor, Zetia,       Essential hypertension -BP stable, continue beta-blocker    Hyperkalemia -Resolved   Acute on chronic CKD stage IIIb, lactic acidosis -Creatinine 2.34 on admission, baseline around 1.8-1.9 -Creatinine improved, close to baseline.  Completed IV fluid hydration.      Shortness of breath with cough -Per patient 4 weeks, dry persistent cough, possibly due to COVID-19 in December (positive on 12/4), underlying history of BOOP -CXR with no pneumonia or effusion.  VQ scan negative for PE.  Normal BNP -Continues to feel winded with exertion, coughing spasms. -Continue prednisone 40 mg daily with a taper upon discharge. (Outpatient on 10 mg BID) -Continue scheduled nebs, incentive spirometry, Tessalon Perles  -Home O2 evaluation done on 1/15, did not meet criteria for home O2   Hyperlipidemia -LDL 74 in 04/2021.  Continue Crestor 40 mg daily, Zetia 10 mg daily   Diabetes mellitus type 2, on long-term insulin with renal complications -Hemoglobin A1c 9.5 on 09/13/2021 --Continue insulin regimen, hopefully CBGs will continue to improve once prednisone taper is completed and back to maintenance dose   BOOP - follows Dr [name] outpatient, continue prednisone     Obesity Estimated body mass index is 32.39 kg/m as calculated from the following:   Height as of this encounter: '5\' 10"'$  (1.778 m).   Weight as of this encounter: 102.4 kg.  10/04/2021  f/u ov/Shabazz Mckey/ [facility] re: sob/ cough/ weak    maint on 20 mg prednisone   Chief Complaint  Patient presents with   Follow-up   Dyspnea:  SNF x 50 ft with walker/belt and one PT lowest sats 91%  but now anemic Cough: improving  Sleeping: 30 degrees and cpap  SABA use: none  02: none  Covid status:   vax x  5 Cp similar to angina p bm's x  7 min  Rec Encourage you to go ahead and use the cough medication as needed  Leave prednisone at 20 mg until better but goal  is to get it back 5 mg baseline      11/15/2021  f/u ov/Katilynn Sinkler/ [facility] re: BOOP  maint on Prednisone 20 mg   Chief Complaint  Patient presents with   Follow-up  Dyspnea:  walked with PT  for 6 min moderate with cp toward the end but did not check sats/ cp always better p ntg immediately / cards aware and now on long acting nitrates  Cough: better  Sleeping: 30 degrees cpap no 02  SABA use: none 02: none  Covid status:  vax x 5   Rec Prednisone 20 mg each am it a floor of 10 mg starting with 20 /15 alternate days for a full week - go back to the previous dose that worked and do the taper 2 weeks at a time Check 02 sats at peak exertion, not after you stop with goal of keeping over 90%     05/29/2022  f/u ov/Meng [place]/ [facility] re: BOOP  maint on prednisone 10 mg  with worse cough at 5 mg  Chief Complaint  Patient presents with   Follow-up    More sob with less activity and more fatigued over the past few weeks.  Sleeps a lot, wears a CPAP.  Dyspnea:  no exercise / walking 25-50 ft max with fatigue limiting and presyncope but less sob and no cp Cough: better at 10 mg daily  Sleeping: cpap / 30 degrees up  SABA use: none 02: none  Light headed standing x months rx by Cards Rec No change rx     08/19/2022  f/u ov/Branson Kranz re: ***   maint on ***  No chief complaint on file.   Dyspnea:  *** Cough: *** Sleeping: *** SABA use: *** 02: *** Covid status:   *** Lung cancer screening :  ***    No obvious day to day or daytime variability or assoc excess/ purulent sputum or mucus plugs or hemoptysis or cp or chest tightness, subjective wheeze or overt sinus or hb symptoms.   *** without nocturnal  or early am exacerbation  of respiratory  c/o's or need for noct saba. Also denies any obvious fluctuation of symptoms with weather or environmental changes or other aggravating or alleviating factors except as outlined above   No unusual exposure hx or h/o childhood pna/  asthma or knowledge of premature birth.  Current Allergies, Complete Past Medical History, Past Surgical History, Family History, and Social History were reviewed in [employer] record.  ROS  The following are not active complaints unless bolded Hoarseness, sore throat, dysphagia, dental problems, itching, sneezing,  nasal congestion or discharge of excess mucus or purulent secretions, ear ache,   fever, chills, sweats, unintended wt loss or wt gain, classically pleuritic or exertional cp,  orthopnea pnd or arm/hand swelling  or leg swelling, presyncope, palpitations, abdominal pain, anorexia, nausea, vomiting, diarrhea  or change in bowel habits or change in bladder habits, change in stools or change in urine, dysuria, hematuria,  rash, arthralgias, visual complaints, headache, numbness, weakness or ataxia or problems with walking or coordination,  change in mood or  memory.        No outpatient medications have been marked as taking for the 08/19/22 encounter (Appointment) with Tanda Rockers, MD.                            Objective:   Physical Exam   Wts  08/19/2022     ***  05/29/2022       239 02/14/2022         238  11/15/2021        236  10/04/2021        230  05/22/2021        230   01/15/2021       231  09/26/2020        220  11/24/2019        206  09/20/2018        222  08/26/2018      222 07/29/2017      232  04/15/2017          243  02/10/2017          250  12/19/2016        250 11/11/2016          258  09/11/2016          252  07/29/2016      244  05/16/2016          238  02/01/2016        235  01/07/2016          228   12/19/15 229 lb 4 oz (103.987 kg)  12/19/15 229 lb 4 oz (103.987 kg)  12/19/15 227 lb (102.967 kg)    Vital signs reviewed  08/19/2022  - Note at rest 02 sats  ***% on ***   General appearance:    ***     trace insp crackles in bases  bilaterally elastic hose both LE s edema***              Assessment & Plan:

## 2022-08-19 NOTE — Discharge Summary (Signed)
Physician Discharge Summary   Patient: Kurt Aguilar MRN: [id number] DOB: 04-14-1946  Admit date:     08/17/2022  Discharge date: 08/19/2022  Discharge Physician: Jennye Boroughs   PCP: [name], MD   Recommendations at discharge:    Follow up with PCP in 1 week  Discharge Diagnoses: Principal Problem:   Severe sepsis ([place]) Active Problems:   Acute gastroenteritis   Acute renal failure superimposed on stage 3b chronic kidney disease (HCC)   Chronic diastolic heart failure (HCC)   Pulmonary infiltrates with high  ESR c/w BOOP/ idiopathic    Steroid dependent ([place])   Cystic kidney disease   Obstructive sleep apnea   Essential hypertension   Coronary artery disease of bypass graft of native heart with stable angina pectoris ([place])   Right bundle branch block  Resolved Problems:   * No resolved hospital problems. *  Hospital Course:  Kurt Aguilar is a 76 y.o. male  with medical history significant for CAD status post CABGx5 in 1997, IDDM 2, HTN, HLD, BOOP with chronic shortness of breath and on chronic steroids, CKD 3B, who presents to the ED with a 4-day history of nausea vomiting and diarrhea associated with poor oral intake and generalized weakness.      Lactic acid was 3 and WBC was 14.8.  He was admitted to the hospital for severe sepsis secondary to gastroenteritis.  He was treated with IV fluids.  His condition has improved and is deemed stable for discharge to home today.  Discharge plan was discussed with the patient and his wife at the bedside.     Assessment and Plan:  Severe sepsis secondary to gastroenteritis: Resolved.  He was unable to provide stool for testing because diarrhea had resolved.     AKI on CKD stage IIIb, polycystic kidney disease: Improved.  Creatinine close to baseline.   Chronic diastolic CHF: Compensated.  Continue metoprolol.  Resume Lasix at discharge  Elevated troponin (likely due to demand ischemia.   BOOP on chronic  prednisone   Other comorbidities include CAD, hypertension, OSA on CPAP      Consultants: None Procedures performed: None  Disposition: Home Diet recommendation:  Discharge Diet Orders (From admission, onward)     Start     Ordered   08/19/22 0000  Diet - low sodium heart healthy        08/19/22 1029   08/19/22 0000  Diet Carb Modified        08/19/22 1029           Cardiac diet DISCHARGE MEDICATION: Allergies as of 08/19/2022       Reactions   Oxycodone-acetaminophen Other (See Comments)   Stops breathing; has tolerated Tylenol before   Oxycodone-acetaminophen Anaphylaxis        Medication List     STOP taking these medications    benzonatate 200 MG capsule Commonly known as: TESSALON   isosorbide mononitrate 30 MG 24 hr tablet Commonly known as: IMDUR   ondansetron 8 MG tablet Commonly known as: Zofran   Ozempic (2 MG/DOSE) 8 MG/3ML Sopn Generic drug: Semaglutide (2 MG/DOSE)       TAKE these medications    allopurinol 100 MG tablet Commonly known as: ZYLOPRIM TAKE 1 TABLET BY MOUTH TWICE A DAY   Aspirin 81 81 MG tablet Generic drug: aspirin EC Take 81 mg by mouth daily. Swallow whole.   Calcium-Vitamin D-Minerals 600-800 MG-UNIT Chew Chew 1 tablet by mouth daily.   colchicine 0.6 MG  tablet Take 1 tablet (0.6 mg total) by mouth as needed (GOUT FLARE). TAKE 1 TABLET BY MOUTH TWICE A DAY WITH A MEAL AS NEEDED FOR ACUTE FLARE OF GOUT   empagliflozin 10 MG Tabs tablet Commonly known as: Jardiance Take 1 tablet (10 mg total) by mouth daily.   ezetimibe 10 MG tablet Commonly known as: ZETIA TAKE 1 TABLET BY MOUTH EVERY DAY   famotidine 20 MG tablet Commonly known as: PEPCID TAKE 1 TABLET BY MOUTH EVERYDAY AT BEDTIME What changed: See the new instructions.   freestyle lancets 1 EACH BY OTHER ROUTE 4 (FOUR) TIMES DAILY. E11.65   FreeStyle Libre 2 Reader Kerrin Mo USE AS INSTRUCTED TO CHECK BLOOD SUGARS.   FreeStyle Libre 2 Sensor Misc 1  Device by Does not apply route every 14 (fourteen) days.   FREESTYLE LITE test strip Generic drug: glucose blood 1 each by Other route 4 (four) times daily. E11.65   furosemide 20 MG tablet Commonly known as: LASIX TAKE 1 TABLET BY MOUTH EVERY DAY What changed:  how much to take how to take this when to take this   GLUCOSAMINE PO Take 1,500 mg by mouth 2 (two) times daily.   Insulin Pen Needle 31G X 8 MM Misc Use as instructed to inject insulin 1x daily   Lutein 20 MG Caps Take 20 mg by mouth daily.   metoprolol succinate 25 MG 24 hr tablet Commonly known as: TOPROL-XL Take 0.5 tablets (12.5 mg total) by mouth at bedtime.   nitroGLYCERIN 0.4 MG SL tablet Commonly known as: NITROSTAT Place 1 tablet (0.4 mg total) under the tongue every 5 (five) minutes x 3 doses as needed for chest pain.   NovoLIN 70/30 Kwikpen (70-30) 100 UNIT/ML KwikPen Generic drug: insulin isophane & regular human KwikPen Inject 40 Units into the skin daily with breakfast. And pen needles 1/day   omeprazole 20 MG capsule Commonly known as: PRILOSEC TAKE 2 CAPSULES (40 MG TOTAL) BY MOUTH DAILY BEFORE BREAKFAST. What changed: See the new instructions.   polyethylene glycol 17 g packet Commonly known as: MIRALAX / GLYCOLAX Take 17 g by mouth daily.   predniSONE 10 MG tablet Commonly known as: DELTASONE Take 1 tablet (10 mg total) by mouth daily with breakfast. Take prednisone 30 mg (3 tabs) for 3 days, then continue your maintenance dose of prednisone What changed:  how much to take how to take this when to take this   rosuvastatin 40 MG tablet Commonly known as: CRESTOR Take 1 tablet (40 mg total) by mouth daily.        Discharge Exam: Filed Weights   08/17/22 1728  Weight: 109.8 kg   GEN: NAD SKIN: Warm and dry EYES: Anicteric ENT: MMM CV: RRR PULM: CTA B ABD: soft, obese, NT, +BS CNS: AAO x 3, non focal EXT: No edema or tenderness   Condition at discharge: good  The  results of significant diagnostics from this hospitalization (including imaging, microbiology, ancillary and laboratory) are listed below for reference.   Imaging Studies: DG Chest Portable 1 View  Result Date: 08/17/2022 CLINICAL DATA:  Chest pain. EXAM: PORTABLE CHEST 1 VIEW COMPARISON:  Chest radiograph dated December 18, 2021 FINDINGS: The heart is enlarged with evidence of prior coronary artery bypass grafting. Mild pulmonary vascular congestion. No evidence of pulmonary edema or large pleural effusion. Thoracic spondylosis. IMPRESSION: Cardiomegaly with mild pulmonary vascular congestion. No evidence of pulmonary edema or large pleural effusion. Electronically Signed   By: Judye Bos.O.  On: 08/17/2022 21:31   CT ABDOMEN PELVIS WO CONTRAST  Result Date: 08/17/2022 CLINICAL DATA:  Left lower quadrant abdominal pain, nausea and vomiting since yesterday EXAM: CT ABDOMEN AND PELVIS WITHOUT CONTRAST TECHNIQUE: Multidetector CT imaging of the abdomen and pelvis was performed following the standard protocol without IV contrast. RADIATION DOSE REDUCTION: This exam was performed according to the departmental dose-optimization program which includes automated exposure control, adjustment of the mA and/or kV according to patient size and/or use of iterative reconstruction technique. COMPARISON:  MR abdomen, 05/07/2022, CT abdomen pelvis, 09/14/2015 FINDINGS: Lower chest: No acute abnormality. Aortic valve calcifications. Coronary artery calcifications. Scarring and or atelectasis of the bilateral lung bases. Hepatobiliary: No solid liver abnormality is seen. Multiple small fluid attenuation cysts and additional subcentimeter low-attenuation lesions too small to characterize although most likely simple cysts, in general better characterized by prior MR and contrast enhanced CT and benign. No further follow-up or characterization is required. No gallstones, gallbladder wall thickening, or biliary dilatation.  Pancreas: Unremarkable. No pancreatic ductal dilatation or surrounding inflammatory changes. Spleen: Normal in size without significant abnormality. Adrenals/Urinary Tract: Adrenal glands are unremarkable. Innumerable bilateral renal cysts, some of which are intrinsically hyperdense or with peripheral calcifications. No obvious solid mass noncontrast CT. No calculi or hydronephrosis. Bladder is unremarkable. Stomach/Bowel: Stomach is within normal limits. Appendix appears normal. No evidence of bowel wall thickening, distention, or inflammatory changes. Vascular/Lymphatic: Aortic atherosclerosis. No enlarged abdominal or pelvic lymph nodes. Reproductive: Mild prostatomegaly. Other: Small, fat containing bilateral inguinal hernias. No ascites. Musculoskeletal: No acute or significant osseous findings. Disc degenerative disease and bridging osteophytosis throughout the lower thoracic and upper lumbar spine in keeping with DISH. IMPRESSION: 1. No acute noncontrast CT findings of the abdomen or pelvis to explain left lower quadrant abdominal pain. Specifically, no evidence of diverticular disease or diverticulitis. 2. Innumerable bilateral renal cysts, some of which are intrinsically hyperdense or with peripheral calcifications. No obvious solid mass by noncontrast CT. Findings are consistent with polycystic kidney disease. Solid renal lesion may be more confidently excluded by multiphasic contrast enhanced CT or MRI on a nonemergent, outpatient basis if desired. 3. Mild prostatomegaly. 4. Coronary artery disease. 5. Aortic valve calcifications. Correlate for echocardiographic evidence of aortic valve dysfunction. Aortic Atherosclerosis (ICD10-I70.0). Electronically Signed   By: Delanna Ahmadi M.D.   On: 08/17/2022 20:00    Microbiology: Results for orders placed or performed during the hospital encounter of 08/17/22  Resp Panel by RT-PCR (Flu A&B, Covid) Anterior Nasal Swab     Status: None   Collection Time:  08/17/22  5:55 PM   Specimen: Anterior Nasal Swab  Result Value Ref Range Status   SARS Coronavirus 2 by RT PCR NEGATIVE NEGATIVE Final    Comment: (NOTE) SARS-CoV-2 target nucleic acids are NOT DETECTED.  The SARS-CoV-2 RNA is generally detectable in upper respiratory specimens during the acute phase of infection. The lowest concentration of SARS-CoV-2 viral copies this assay can detect is 138 copies/mL. A negative result does not preclude SARS-Cov-2 infection and should not be used as the sole basis for treatment or other patient management decisions. A negative result may occur with  improper specimen collection/handling, submission of specimen other than nasopharyngeal swab, presence of viral mutation(s) within the areas targeted by this assay, and inadequate number of viral copies(<138 copies/mL). A negative result must be combined with clinical observations, patient history, and epidemiological information. The expected result is Negative.  Fact Sheet for Patients:  EntrepreneurPulse.com.au  Fact Sheet for Healthcare Providers:  IncredibleEmployment.be  This test is no t yet approved or cleared by the [place] and  has been authorized for detection and/or diagnosis of SARS-CoV-2 by FDA under an Emergency Use Authorization (EUA). This EUA will remain  in effect (meaning this test can be used) for the duration of the COVID-19 declaration under Section 564(b)(1) of the Act, 21 U.S.C.section 360bbb-3(b)(1), unless the authorization is terminated  or revoked sooner.       Influenza A by PCR NEGATIVE NEGATIVE Final   Influenza B by PCR NEGATIVE NEGATIVE Final    Comment: (NOTE) The Xpert Xpress SARS-CoV-2/FLU/RSV plus assay is intended as an aid in the diagnosis of influenza from Nasopharyngeal swab specimens and should not be used as a sole basis for treatment. Nasal washings and aspirates are unacceptable for Xpert Xpress  SARS-CoV-2/FLU/RSV testing.  Fact Sheet for Patients: EntrepreneurPulse.com.au  Fact Sheet for Healthcare Providers: IncredibleEmployment.be  This test is not yet approved or cleared by the [place] FDA and has been authorized for detection and/or diagnosis of SARS-CoV-2 by FDA under an Emergency Use Authorization (EUA). This EUA will remain in effect (meaning this test can be used) for the duration of the COVID-19 declaration under Section 564(b)(1) of the Act, 21 U.S.C. section 360bbb-3(b)(1), unless the authorization is terminated or revoked.  Performed at [facility], [address] [place], [place] [address]   Blood culture (routine x 2)     Status: None (Preliminary result)   Collection Time: 08/17/22  9:31 PM   Specimen: BLOOD  Result Value Ref Range Status   Specimen Description BLOOD RIGHT ANTECUBITAL  Final   Special Requests   Final    Blood Culture results may not be optimal due to an excessive volume of blood received in culture bottles   Culture   Final    NO GROWTH < 12 HOURS Performed at [facility], [address] [place], [place] [address]    Report Status PENDING  Incomplete  Blood culture (routine x 2)     Status: None (Preliminary result)   Collection Time: 08/17/22  9:31 PM   Specimen: BLOOD  Result Value Ref Range Status   Specimen Description BLOOD RIGHT ANTECUBITAL  Final   Special Requests   Final    Blood Culture results may not be optimal due to an excessive volume of blood received in culture bottles   Culture   Final    NO GROWTH < 12 HOURS Performed at [facility], [address] [place], [place] [address]    Report Status PENDING  Incomplete    Labs: CBC: Recent Labs  Lab 08/17/22 1734 08/18/22 0233  WBC 14.8* 9.5  HGB 16.3 13.1  HCT 51.9 40.5  MCV 98.5 96.7  PLT 223 458   Basic Metabolic Panel: Recent Labs  Lab 08/17/22 1755 08/18/22 0233  08/19/22 0316  NA 139 140 142  K 4.2 3.6 3.7  CL 105 109 112*  CO2 18* 21* 23  GLUCOSE 184* 137* 135*  BUN 57* 57* 44*  CREATININE 3.04* 3.08*  3.10* 2.42*  CALCIUM 9.1 8.1* 8.2*   Liver Function Tests: Recent Labs  Lab 08/17/22 1755 08/18/22 0233  AST 33 21  ALT 26 18  ALKPHOS 50 35*  BILITOT 1.3* 1.1  PROT 7.2 5.4*  ALBUMIN 4.1 3.2*   CBG: Recent Labs  Lab 08/18/22 1203 08/18/22 1626 08/18/22 1719 08/18/22 2049 08/19/22 0742  GLUCAP 130* 212* 209* 147* 129*    Discharge  time spent: greater than 30 minutes.  Signed: Jennye Boroughs, MD [facility] 08/19/2022

## 2022-08-19 NOTE — Plan of Care (Signed)
Patient is appropriate for discharge to home environment with family support via private vehicle.

## 2022-08-20 ENCOUNTER — Telehealth: Payer: Self-pay | Admitting: *Deleted

## 2022-08-20 NOTE — Progress Notes (Signed)
  Care Coordination  Note  08/20/2022 Name: RENELL ALLUM MRN: [id number] DOB: [birth year]  LONZY MATO is a 76 y.o. year old primary care patient of [place], [name], MD. I reached out to [name] by phone today to assist with scheduling a follow up appointment. [name] verbally consented to my assistance.       Follow up plan: Hospital Follow Up appointment scheduled with ([name]) on (08/25/2022) at (1130am).  Julian Hy, [place] Direct Dial: [phone]

## 2022-08-20 NOTE — Patient Outreach (Addendum)
  Care Coordination [facility] Note Transition Care Management Follow-up Telephone Call Date of discharge and from where: [facility] [phone] How have you been since you were released from the hospital? Feel tired but doing good Any questions or concerns? No  Items Reviewed: Did the pt receive and understand the discharge instructions provided? Yes  Medications obtained and verified? Yes  Other? No  Any new allergies since your discharge? No  Dietary orders reviewed? Yes low sodium heart healthy carb modified Do you have support at home? Yes   Home Care and Equipment/Supplies: Were home health services ordered? not applicable If so, what is the name of the agency? N   Has the agency set up a time to come to the patient's home? no Were any new equipment or medical supplies ordered?  No What is the name of the medical supply agency? N/a Were you able to get the supplies/equipment? not applicable Do you have any questions related to the use of the equipment or supplies? No  Functional Questionnaire: (I = Independent and D = Dependent) ADLs: I  Bathing/Dressing- I  Meal Prep- I  Eating- I  Maintaining continence- I  Transferring/Ambulation- I  Managing Meds- I  Follow up appointments reviewed:  PCP Hospital f/u appt confirmed? No  . [facility] f/u appt confirmed? Yes   [name] [phone] 11:00 Dr Renne Crigler [phone] 1:20 Are transportation arrangements needed? No  If their condition worsens, is the pt aware to call PCP or go to the Emergency Dept.? Yes Was the patient provided with contact information for the PCP's office or ED? Yes Was to pt encouraged to call back with questions or concerns? Yes  SDOH assessments and interventions completed:   Yes SDOH Interventions Today    Flowsheet Row Most Recent Value  SDOH Interventions   Food Insecurity Interventions Intervention Not Indicated  Housing Interventions Intervention Not Indicated  Transportation Interventions  Intervention Not Indicated       Care Coordination Interventions:  PCP follow up appointment requested   Encounter Outcome:  Pt. Visit Completed    [facility] [phone]

## 2022-08-21 DIAGNOSIS — Q612 Polycystic kidney, adult type: Secondary | ICD-10-CM | POA: Diagnosis not present

## 2022-08-21 DIAGNOSIS — I10 Essential (primary) hypertension: Secondary | ICD-10-CM | POA: Diagnosis not present

## 2022-08-21 DIAGNOSIS — E1129 Type 2 diabetes mellitus with other diabetic kidney complication: Secondary | ICD-10-CM | POA: Diagnosis not present

## 2022-08-21 DIAGNOSIS — N184 Chronic kidney disease, stage 4 (severe): Secondary | ICD-10-CM | POA: Diagnosis not present

## 2022-08-22 ENCOUNTER — Telehealth: Payer: Self-pay

## 2022-08-22 LAB — CULTURE, BLOOD (ROUTINE X 2)
Culture: NO GROWTH
Culture: NO GROWTH

## 2022-08-22 NOTE — Chronic Care Management (AMB) (Addendum)
Chronic Care Management Pharmacy Assistant   Name: Kurt Aguilar  MRN: [id number] DOB: 12-14-1945  Reason for Encounter: Hospital Follow UP non CCM   Medications: Outpatient Encounter Medications as of 08/22/2022  Medication Sig   allopurinol (ZYLOPRIM) 100 MG tablet TAKE 1 TABLET BY MOUTH TWICE A DAY   aspirin EC (ASPIRIN 81) 81 MG tablet Take 81 mg by mouth daily. Swallow whole.   Calcium Carbonate-Vit D-Min (CALCIUM-VITAMIN D-MINERALS) 600-800 MG-UNIT CHEW Chew 1 tablet by mouth daily.   colchicine 0.6 MG tablet Take 1 tablet (0.6 mg total) by mouth as needed (GOUT FLARE). TAKE 1 TABLET BY MOUTH TWICE A DAY WITH A MEAL AS NEEDED FOR ACUTE FLARE OF GOUT   Continuous Blood Gluc Receiver (FREESTYLE LIBRE 2 READER) DEVI USE AS INSTRUCTED TO CHECK BLOOD SUGARS.   Continuous Blood Gluc Sensor (FREESTYLE LIBRE 2 SENSOR) MISC 1 Device by Does not apply route every 14 (fourteen) days.   empagliflozin (JARDIANCE) 10 MG TABS tablet Take 1 tablet (10 mg total) by mouth daily.   ezetimibe (ZETIA) 10 MG tablet TAKE 1 TABLET BY MOUTH EVERY DAY   famotidine (PEPCID) 20 MG tablet TAKE 1 TABLET BY MOUTH EVERYDAY AT BEDTIME (Patient taking differently: Take 20 mg by mouth at bedtime. TAKE 1 TABLET BY MOUTH EVERYDAY AT BEDTIME)   furosemide (LASIX) 20 MG tablet TAKE 1 TABLET BY MOUTH EVERY DAY (Patient taking differently: Take 20 mg by mouth daily. TAKE 1 TABLET BY MOUTH EVERY DAY)   Glucosamine HCl (GLUCOSAMINE PO) Take 1,500 mg by mouth 2 (two) times daily.    glucose blood (FREESTYLE LITE) test strip 1 each by Other route 4 (four) times daily. E11.65   insulin isophane & regular human KwikPen (NOVOLIN 70/30 KWIKPEN) (70-30) 100 UNIT/ML KwikPen Inject 40 Units into the skin daily with breakfast. And pen needles 1/day   Insulin Pen Needle 31G X 8 MM MISC Use as instructed to inject insulin 1x daily   Lancets (FREESTYLE) lancets 1 EACH BY OTHER ROUTE 4 (FOUR) TIMES DAILY. E11.65   Lutein 20 MG CAPS  Take 20 mg by mouth daily.    metoprolol succinate (TOPROL-XL) 25 MG 24 hr tablet Take 0.5 tablets (12.5 mg total) by mouth at bedtime.   nitroGLYCERIN (NITROSTAT) 0.4 MG SL tablet Place 1 tablet (0.4 mg total) under the tongue every 5 (five) minutes x 3 doses as needed for chest pain.   omeprazole (PRILOSEC) 20 MG capsule TAKE 2 CAPSULES (40 MG TOTAL) BY MOUTH DAILY BEFORE BREAKFAST. (Patient taking differently: Take 40 mg by mouth daily before breakfast.)   polyethylene glycol (MIRALAX / GLYCOLAX) 17 g packet Take 17 g by mouth daily.   predniSONE (DELTASONE) 10 MG tablet Take 1 tablet (10 mg total) by mouth daily with breakfast. Take prednisone 30 mg (3 tabs) for 3 days, then continue your maintenance dose of prednisone   rosuvastatin (CRESTOR) 40 MG tablet Take 1 tablet (40 mg total) by mouth daily.   No facility-administered encounter medications on file as of 08/22/2022.    Reviewed hospital notes for details of recent visit. Has patient been contacted by Transitions of Care team? Yes Has patient seen PCP/specialist for hospital follow up (summarize OV if yes): No  Scheduled 08/25/22  Admitted to the ED on 08/17/22. Discharge date was 08/19/22.  Discharged from [facility].   Discharge diagnosis (Principal Problem): Severe Sepsis Patient was discharged to Home  Brief summary of hospital course:  4-day history of nausea vomiting and  diarrhea associated with poor oral intake and generalized weakness. Severe sepsis secondary to gastroenteritis: Resolved.  He was unable to provide stool for testing because diarrhea had resolved.    Lactic acid was 3 and WBC was 14.8.  He was admitted to the hospital for severe sepsis secondary to gastroenteritis.  He was treated with IV fluids.  His condition has improved and is deemed stable for discharge to home today.  Discharge plan was discussed with the patient and his wife at the bedside.   Medication Changes at Hospital Discharge: -Changed  prednisone  Medications Discontinued at Hospital Discharge: -Stopped benzonatate  Imdur  Ondansetron  Ozempic  Medications that remain the same after Hospital Discharge:??  -All other medications will remain the same.    Next CCM appt: none  Other upcoming appts: PCP appointment on 08/25/22  [name], PharmD notified and will determine if action is needed    Pharmacist addendum: Reviewed chart. Pt has spoken with [facility] team and has seen PCP for follow up. Jardiance was discontinued previously by nephrology and glucose is up, plans to follow up with endocrine for mgmt. No PharmD intervention warranted.  [name], PharmD, BCACP 09/05/22 11:13 AM

## 2022-08-25 ENCOUNTER — Encounter: Payer: Self-pay | Admitting: Family Medicine

## 2022-08-25 ENCOUNTER — Ambulatory Visit (INDEPENDENT_AMBULATORY_CARE_PROVIDER_SITE_OTHER): Payer: PPO | Admitting: Family Medicine

## 2022-08-25 VITALS — BP 130/75 | HR 68 | Temp 97.9°F | Ht 70.25 in | Wt 244.5 lb

## 2022-08-25 DIAGNOSIS — I10 Essential (primary) hypertension: Secondary | ICD-10-CM | POA: Diagnosis not present

## 2022-08-25 DIAGNOSIS — E1159 Type 2 diabetes mellitus with other circulatory complications: Secondary | ICD-10-CM

## 2022-08-25 DIAGNOSIS — E1165 Type 2 diabetes mellitus with hyperglycemia: Secondary | ICD-10-CM

## 2022-08-25 DIAGNOSIS — N179 Acute kidney failure, unspecified: Secondary | ICD-10-CM

## 2022-08-25 DIAGNOSIS — N1832 Chronic kidney disease, stage 3b: Secondary | ICD-10-CM

## 2022-08-25 DIAGNOSIS — R918 Other nonspecific abnormal finding of lung field: Secondary | ICD-10-CM | POA: Diagnosis not present

## 2022-08-25 DIAGNOSIS — K529 Noninfective gastroenteritis and colitis, unspecified: Secondary | ICD-10-CM | POA: Diagnosis not present

## 2022-08-25 LAB — BASIC METABOLIC PANEL
BUN: 40 mg/dL — ABNORMAL HIGH (ref 6–23)
CO2: 21 mEq/L (ref 19–32)
Calcium: 9.1 mg/dL (ref 8.4–10.5)
Chloride: 106 mEq/L (ref 96–112)
Creatinine, Ser: 2.62 mg/dL — ABNORMAL HIGH (ref 0.40–1.50)
GFR: 22.63 mL/min — ABNORMAL LOW (ref >60.00)
Glucose, Bld: 169 mg/dL — ABNORMAL HIGH (ref 70–99)
Potassium: 4.6 mEq/L (ref 3.5–5.1)
Sodium: 140 mEq/L (ref 135–145)

## 2022-08-25 NOTE — Assessment & Plan Note (Deleted)
Worsened with dehydration/sepsis recent Reviewed hospital records, lab results and studies in detail  Much clinical imp and now hydrated   Lab ordered for bmet today

## 2022-08-25 NOTE — Assessment & Plan Note (Signed)
S/p hosp for sepsis/dehydration bp is stable  BP: 130/75  No dizziness

## 2022-08-25 NOTE — Assessment & Plan Note (Signed)
Per pt- jardiance was d/c by nephrology and now glucose is up  Plans to touch base with endocrinology   On 10 mg prednisone daily for BOOP

## 2022-08-25 NOTE — Assessment & Plan Note (Signed)
Worsened with dehydration/sepsis recent Reviewed hospital records, lab results and studies in detail  Much clinical imp and now hydrated   Lab ordered for bmet today

## 2022-08-25 NOTE — Assessment & Plan Note (Signed)
Noted in hosp for gastroenteritis / sepsis  Reviewed hospital records, lab results and studies in detail  Now clinically improved  Is hydrated  Bmp ordered  Was close to baseline at d/c

## 2022-08-25 NOTE — Progress Notes (Signed)
Subjective:    Patient ID: Kurt Aguilar, male    DOB: 1946/05/30, 76 y.o.   MRN: [id number]  HPI Pt presents for f/u of hosp for sepsis with gastroenteritis   Wt Readings from Last 3 Encounters:  08/25/22 244 lb 8 oz (110.9 kg)  08/17/22 242 lb (109.8 kg)  06/19/22 244 lb (110.7 kg)   34.83 kg/m  Assessment and Plan:  he presented to ER on 12/10 after 4 d of n/v/d  Was dehydrated and found to be septic   Hosp from 12/10 to 12/12 with clinical improvement   Severe sepsis secondary to gastroenteritis: Resolved.  He was unable to provide stool for testing because diarrhea had resolved.     AKI on CKD stage IIIb, polycystic kidney disease: Improved.  Creatinine close to baseline.   Chronic diastolic CHF: Compensated.  Continue metoprolol.  Resume Lasix at discharge   Elevated troponin (likely due to demand ischemia.   BOOP on chronic prednisone   Other comorbidities include CAD, hypertension, OSA on CPAP   Last labs Lab Results  Component Value Date   CREATININE 2.42 (H) 08/19/2022   BUN 44 (H) 08/19/2022   NA 142 08/19/2022   K 3.7 08/19/2022   CL 112 (H) 08/19/2022   CO2 23 08/19/2022  GFR of 27  close to baseline  Lab Results  Component Value Date   ALT 18 08/18/2022   AST 21 08/18/2022   ALKPHOS 35 (L) 08/18/2022   BILITOT 1.1 08/18/2022   Lab Results  Component Value Date   WBC 9.5 08/18/2022   HGB 13.1 08/18/2022   HCT 40.5 08/18/2022   MCV 96.7 08/18/2022   PLT 185 08/18/2022   Lab Results  Component Value Date   HGBA1C 6.6 (A) 05/06/2022   CT abd/pelv IMPRESSION: 1. No acute noncontrast CT findings of the abdomen or pelvis to explain left lower quadrant abdominal pain. Specifically, no evidence of diverticular disease or diverticulitis. 2. Innumerable bilateral renal cysts, some of which are intrinsically hyperdense or with peripheral calcifications. No obvious solid mass by noncontrast CT. Findings are consistent with polycystic kidney  disease. Solid renal lesion may be more confidently excluded by multiphasic contrast enhanced CT or MRI on a nonemergent, outpatient basis if desired. 3. Mild prostatomegaly. 4. Coronary artery disease. 5. Aortic valve calcifications. Correlate for echocardiographic evidence of aortic valve dysfunction.   Aortic Atherosclerosis (ICD10-I70.0).  BP Readings from Last 3 Encounters:  08/25/22 (!) 140/78  08/19/22 (!) 142/71  06/19/22 126/80   Pulse Readings from Last 3 Encounters:  08/25/22 68  08/19/22 65  06/19/22 83   Still feels weak /generally  No pain   No more diarrhea or vomiting  Does have an appetite  Still had a little nausea -was after his pills   Is able to get protein in  Drinking lots of fluids- water mostly  Occ crystal light as well   Dr Merita Norton took him off of jardiance and his blood sugar is very high   Yesterday was 375  Has appt with Dr Cruzita Lederer next week   Baseline sob - esp at night  This sometimes worsens with panic /anxiety also  Cannot do much  Had to re schedule appt with pulmonary during hosp A little wheezing  =today back down to 10 mg for his BOOP  Patient Active Problem List   Diagnosis Date Noted   Acute gastroenteritis 08/17/2022   Severe sepsis ([place]) 08/17/2022   Lumbar pain 01/30/2022   Chronic diastolic  heart failure ([place]) 10/23/2021   Acute renal failure superimposed on stage 3b chronic kidney disease ([place]) 09/17/2021   Stage 3b chronic kidney disease (CKD) ([place]) 09/17/2021   Acute coronary syndrome ([place]) 09/17/2021   Osteopenia 05/16/2021   Steroid dependent ([place]) 04/12/2021   Primary osteoarthritis of left knee 05/29/2020   Colon cancer screening 02/13/2017   Poorly controlled type 2 diabetes mellitus with circulatory disorder ([place]) 08/13/2016   Chronic heel pain, left 08/12/2016   Upper airway cough syndrome 07/31/2016   Class 1 obesity with serious comorbidity and body mass index (BMI) of 32.0 to 32.9 in adult 05/19/2016    Routine general medical examination at a health care facility 01/27/2016   Pulmonary infiltrates with high  ESR c/w BOOP/ idiopathic  12/19/2015   Cough 11/29/2015   Cystic kidney disease 10/01/2015   Renal lesion 09/27/2015   Erectile dysfunction of organic origin 09/27/2015   BPH with obstruction/lower urinary tract symptoms 09/27/2015   Constipation 09/24/2015   Cyst of right kidney 09/24/2015   CAD (coronary artery disease) 06/08/2015   Grieving 11/22/2014   Encounter for Medicare annual wellness exam 07/11/2013   Prostate cancer screening 07/03/2013   Right bundle branch block 02/22/2013   Chronic low back pain 05/13/2011   Obstructive sleep apnea 03/15/2010   Hyperlipidemia associated with type 2 diabetes mellitus ([place]) 04/12/2009   Gout 04/12/2009   Essential hypertension 04/12/2009   Coronary artery disease of bypass graft of native heart with stable angina pectoris ([place]) 04/12/2009   GERD 04/12/2009   DIVERTICULITIS, HX OF 04/12/2009   Past Medical History:  Diagnosis Date   Arthritis    Chronic kidney disease    Complication of anesthesia    constipation and inability to  urinate happened a few days after cataract surgery    Diabetes ([place])    type 2    Diverticulitis    GERD (gastroesophageal reflux disease)    Gout    Heart attack ([place])    Heart disease    Heart murmur    Hyperglycemia    Hyperlipidemia    Hypertension    Osteoporosis    Pneumonia    hx of BOOP followed by Dr [name]    Sleep apnea    cpapp - setting at 17    Past Surgical History:  Procedure Laterality Date   ACROMIO-CLAVICULAR JOINT REPAIR Left 05/05/2019   Procedure: SHOULDER ARTHROSCOPIC ASSISTED ACROMIO-CLAVICULAR JOINT REPAIR;  Surgeon: Tania Ade, MD;  Location: [facility];  Service: Orthopedics;  Laterality: Left;   CATARACT EXTRACTION Bilateral    CORONARY ARTERY BYPASS GRAFT     [place]   LEFT HEART CATH AND CORS/GRAFTS ANGIOGRAPHY N/A 09/19/2021   Procedure: LEFT HEART  CATH AND CORS/GRAFTS ANGIOGRAPHY;  Surgeon: Belva Crome, MD;  Location: [facility];  Service: Cardiovascular;  Laterality: N/A;   open heart surgery  09/09/1995-1998   5 bypass   REPAIR KNEE LIGAMENT     right   SHOULDER ARTHROSCOPY Left 05/05/2019   Procedure: ARTHROSCOPY SHOULDER;  Surgeon: Tania Ade, MD;  Location: [facility];  Service: Orthopedics;  Laterality: Left;   TONSILLECTOMY     TOTAL KNEE ARTHROPLASTY Left 05/29/2020   Procedure: LEFT TOTAL KNEE ARTHROPLASTY;  Surgeon: Melrose Nakayama, MD;  Location: [facility];  Service: Orthopedics;  Laterality: Left;   Social History   Tobacco Use   Smoking status: Former    Packs/day: 1.00    Years: 3.00    Total pack years: 3.00  Types: Cigars, Cigarettes    Quit date: 11/25/2004    Years since quitting: 17.7   Smokeless tobacco: Never   Tobacco comments:    occas. cigar quit 2006  Vaping Use   Vaping Use: Never used  Substance Use Topics   Alcohol use: Yes    Alcohol/week: 1.0 standard drink of alcohol    Types: 1 Standard drinks or equivalent per week    Comment: rare   Drug use: No   Family History  Problem Relation Age of Onset   Heart disease Mother    Heart disease Father    Stroke Father    Heart attack Son 44   Kidney disease Neg Hx    Prostate cancer Neg Hx    Diabetes Neg Hx    Allergies  Allergen Reactions   Oxycodone-Acetaminophen Other (See Comments)    Stops breathing; has tolerated Tylenol before   Oxycodone-Acetaminophen Anaphylaxis   Current Outpatient Medications on File Prior to Visit  Medication Sig Dispense Refill   allopurinol (ZYLOPRIM) 100 MG tablet TAKE 1 TABLET BY MOUTH TWICE A DAY 180 tablet 3   aspirin EC (ASPIRIN 81) 81 MG tablet Take 81 mg by mouth daily. Swallow whole.     Calcium Carbonate-Vit D-Min (CALCIUM-VITAMIN D-MINERALS) 600-800 MG-UNIT CHEW Chew 1 tablet by mouth daily.     colchicine 0.6 MG tablet Take 1 tablet (0.6 mg total) by mouth as needed (GOUT FLARE). TAKE  1 TABLET BY MOUTH TWICE A DAY WITH A MEAL AS NEEDED FOR ACUTE FLARE OF GOUT     Continuous Blood Gluc Receiver (FREESTYLE LIBRE 2 READER) DEVI USE AS INSTRUCTED TO CHECK BLOOD SUGARS. 1 each 0   Continuous Blood Gluc Sensor (FREESTYLE LIBRE 2 SENSOR) MISC 1 Device by Does not apply route every 14 (fourteen) days. 6 each 3   ezetimibe (ZETIA) 10 MG tablet TAKE 1 TABLET BY MOUTH EVERY DAY 90 tablet 3   famotidine (PEPCID) 20 MG tablet TAKE 1 TABLET BY MOUTH EVERYDAY AT BEDTIME (Patient taking differently: Take 20 mg by mouth at bedtime. TAKE 1 TABLET BY MOUTH EVERYDAY AT BEDTIME) 90 tablet 1   furosemide (LASIX) 20 MG tablet TAKE 1 TABLET BY MOUTH EVERY DAY (Patient taking differently: Take 20 mg by mouth daily. TAKE 1 TABLET BY MOUTH EVERY DAY) 90 tablet 3   Glucosamine HCl (GLUCOSAMINE PO) Take 1,500 mg by mouth 2 (two) times daily.      glucose blood (FREESTYLE LITE) test strip 1 each by Other route 4 (four) times daily. E11.65 400 strip 0   insulin isophane & regular human KwikPen (NOVOLIN 70/30 KWIKPEN) (70-30) 100 UNIT/ML KwikPen Inject 40 Units into the skin daily with breakfast. And pen needles 1/day     Insulin Pen Needle 31G X 8 MM MISC Use as instructed to inject insulin 1x daily 100 each 2   Lancets (FREESTYLE) lancets 1 EACH BY OTHER ROUTE 4 (FOUR) TIMES DAILY. E11.65 400 each 0   Lutein 20 MG CAPS Take 20 mg by mouth daily.      metoprolol succinate (TOPROL-XL) 25 MG 24 hr tablet Take 0.5 tablets (12.5 mg total) by mouth at bedtime. 45 tablet 1   nitroGLYCERIN (NITROSTAT) 0.4 MG SL tablet Place 1 tablet (0.4 mg total) under the tongue every 5 (five) minutes x 3 doses as needed for chest pain. 30 tablet 12   omeprazole (PRILOSEC) 20 MG capsule TAKE 2 CAPSULES (40 MG TOTAL) BY MOUTH DAILY BEFORE BREAKFAST. (Patient taking differently: Take 40  mg by mouth daily before breakfast.) 180 capsule 3   polyethylene glycol (MIRALAX / GLYCOLAX) 17 g packet Take 17 g by mouth daily.     predniSONE  (DELTASONE) 10 MG tablet Take 1 tablet (10 mg total) by mouth daily with breakfast. Take prednisone 30 mg (3 tabs) for 3 days, then continue your maintenance dose of prednisone  0   rosuvastatin (CRESTOR) 40 MG tablet Take 1 tablet (40 mg total) by mouth daily. 90 tablet 2   No current facility-administered medications on file prior to visit.    Review of Systems  Constitutional:  Positive for fatigue. Negative for activity change, appetite change, fever and unexpected weight change.  HENT:  Negative for congestion, rhinorrhea, sore throat and trouble swallowing.   Eyes:  Negative for pain, redness, itching and visual disturbance.  Respiratory:  Positive for shortness of breath. Negative for cough, choking, chest tightness and wheezing.   Cardiovascular:  Negative for chest pain and palpitations.  Gastrointestinal:  Negative for abdominal pain, blood in stool, constipation, diarrhea and nausea.  Endocrine: Negative for cold intolerance, heat intolerance, polydipsia and polyuria.  Genitourinary:  Negative for difficulty urinating, dysuria, frequency and urgency.  Musculoskeletal:  Negative for arthralgias, joint swelling and myalgias.  Skin:  Negative for pallor and rash.  Neurological:  Negative for dizziness, tremors, weakness, numbness and headaches.  Hematological:  Negative for adenopathy. Does not bruise/bleed easily.  Psychiatric/Behavioral:  Negative for decreased concentration and dysphoric mood. The patient is not nervous/anxious.        Objective:   Physical Exam Constitutional:      General: He is not in acute distress.    Appearance: Normal appearance. He is well-developed. He is obese. He is not ill-appearing or diaphoretic.  HENT:     Head: Normocephalic and atraumatic.     Right Ear: Ear canal normal.     Left Ear: Ear canal normal.     Nose: No congestion.     Mouth/Throat:     Mouth: Mucous membranes are moist.     Pharynx: No posterior oropharyngeal erythema.   Eyes:     General:        Right eye: No discharge.        Left eye: No discharge.     Conjunctiva/sclera: Conjunctivae normal.     Pupils: Pupils are equal, round, and reactive to light.  Neck:     Thyroid: No thyromegaly.     Vascular: No carotid bruit or JVD.  Cardiovascular:     Rate and Rhythm: Normal rate and regular rhythm.     Heart sounds: Murmur heard.     No gallop.  Pulmonary:     Effort: Pulmonary effort is normal. No respiratory distress.     Breath sounds: Normal breath sounds. No wheezing or rales.     Comments: Diffusely distant bs Some end exp wheezing - mild  No crackles or rales Abdominal:     General: There is no distension or abdominal bruit.     Palpations: Abdomen is soft.  Musculoskeletal:     Cervical back: Normal range of motion and neck supple. No tenderness.     Right lower leg: No edema.     Left lower leg: No edema.  Lymphadenopathy:     Cervical: No cervical adenopathy.  Skin:    General: Skin is warm and dry.     Coloration: Skin is not pale.     Findings: No bruising or rash.  Comments: Sallow complexion   Neurological:     Mental Status: He is alert.     Coordination: Coordination normal.     Deep Tendon Reflexes: Reflexes are normal and symmetric. Reflexes normal.  Psychiatric:        Mood and Affect: Mood normal.           Assessment & Plan:   Problem List Items Addressed This Visit       Cardiovascular and Mediastinum   Essential hypertension (Chronic)    S/p hosp for sepsis/dehydration bp is stable  BP: 130/75  No dizziness          Respiratory   Pulmonary infiltrates with high  ESR c/w BOOP/ idiopathic     More sob since hosp for sepsis  Plans to check in with pulmonary since his f/u was cancelled in hospital   Continues prednisone 10 mg         Digestive   Acute gastroenteritis - Primary    Recent hospitalization for weakness /sepsis and dehydration 2nd to above  Reviewed hospital records, lab  results and studies in detail  Now clinically resolved (GI sympt) but still weak Declines HH for PT, thinks he can work on strength at gym as tolerated  Lab today for bmet        Endocrine   Poorly controlled type 2 diabetes mellitus with circulatory disorder ([place])    Per pt- jardiance was d/c by nephrology and now glucose is up  Plans to touch base with endocrinology   On 10 mg prednisone daily for BOOP        Genitourinary   Stage 3b chronic kidney disease (CKD) ([facility]) (Chronic)    Worsened with dehydration/sepsis recent Reviewed hospital records, lab results and studies in detail  Much clinical imp and now hydrated   Lab ordered for bmet today      Acute renal failure superimposed on stage 3b chronic kidney disease ([place])    Noted in hosp for gastroenteritis / sepsis  Reviewed hospital records, lab results and studies in detail  Now clinically improved  Is hydrated  Bmp ordered  Was close to baseline at d/c      Relevant Orders   Basic metabolic panel

## 2022-08-25 NOTE — Patient Instructions (Addendum)
Call the pulmonary office and schedule follow up and discuss the shortness of breath   Follow up with Dr Cruzita Lederer for blood sugar   Lab today for kidney function    Take care of yourself  Give yourself time to get energy back Take breaks

## 2022-08-25 NOTE — Assessment & Plan Note (Signed)
More sob since hosp for sepsis  Plans to check in with pulmonary since his f/u was cancelled in hospital   Continues prednisone 10 mg

## 2022-08-25 NOTE — Assessment & Plan Note (Signed)
Recent hospitalization for weakness /sepsis and dehydration 2nd to above  Reviewed hospital records, lab results and studies in detail  Now clinically resolved (GI sympt) but still weak Declines HH for PT, thinks he can work on strength at gym as tolerated  Lab today for bmet

## 2022-08-26 NOTE — Progress Notes (Signed)
Cardiology Office Note  Date:  08/29/2022   ID:  Kurt Aguilar, DOB March 13, 1946, MRN [id number]  PCP:  [name], MD   Chief Complaint  Patient presents with   6 month follow up     Patient c/o shortness of breath. Medications reviewed by the patient verbally.     HPI:  76 yo with history of  CAD,  CABG back in 1997 in [place], [place],  HTN,  Diabetes with chronic renal insufficiency hyperlipidemia,  gout He lost his son in early 2016 to cardiac arrest Aortic valve murmur/aortic valve disease h/o reactive airway symptoms, Boop,  Chronic renal insufficiency presents for routine followup of his coronary artery disease, CRI   LOV 6/23 Recent hospital admission, getting over gastroenteritis, acute renal injury, On prednisone taper for boop, was up to 40 mg now tapering down to 10 Not on inhalers Followed by pulmonary  Denies significant lower extremity swelling, denies tachypalpitations concerning for arrhythmia No significant chest pain concerning for angina On Lasix 20 daily, creatinine 2.6  Prior workup for shortness of breath beginning of 2023 cardiac catheterization January 2023, moderate disease and vein graft to the RCA  EKG personally reviewed by myself on todays visit Normal sinus rhythm rate 66 bpm right bundle branch block old inferior M  Other past medical history reviewed COVID-19 in December (positive on 12/4), underlying history of BOOP  hospitalization January 2023, Non-STEMI Cardiac catheterization performed multivessel native coronary artery diseasesaphenous vein to right coronary artery with 70% ostial to proximal stenosis, SVG to OM 1 and 2 patent, SVG to first diagonal patent, LIMA to LAD patent.   The saphenous vein graft to the right coronary can be stented although it is not so critical that it should be causing enzyme elevation.  Enzyme elevation likely related to left coronary native disease in the septal perforator territory.  Echo  09/18/21 1. Left ventricular ejection fraction, by estimation, is 60 to 65%. The  left ventricle has normal function. The left ventricle has no regional  wall motion abnormalities. Left ventricular diastolic parameters are  consistent with Grade I diastolic  dysfunction (impaired relaxation). Elevated left ventricular end-diastolic  pressure.   2. Right ventricular systolic function is moderately reduced. The right  ventricular size is normal.   3. The mitral valve is normal in structure. No evidence of mitral valve  regurgitation. No evidence of mitral stenosis.   4. The AV is poorly visualized to comment on structure. There does appear  to be come calcification of the valve. . The aortic valve was not well  visualized. Aortic valve regurgitation is not visualized. No aortic  stenosis is present.   h/o reactive airway symptoms, Boop PreviouslyStarted on prednisoneMarch 2017  for shortness of breath, improvement of symptoms,  cough returned after prednisone was discontinued getting close To his baseline Minimal wheezing and cough predisone 5 mg daily, Plan may be to wean the dose Managed by Dr. [name] , Pulmonary   PMH:   has a past medical history of Arthritis, Chronic kidney disease, Complication of anesthesia, Diabetes ([place]), Diverticulitis, GERD (gastroesophageal reflux disease), Gout, Heart attack ([place]), Heart disease, Heart murmur, Hyperglycemia, Hyperlipidemia, Hypertension, Osteoporosis, Pneumonia, and Sleep apnea.  PSH:    Past Surgical History:  Procedure Laterality Date   ACROMIO-CLAVICULAR JOINT REPAIR Left 05/05/2019   Procedure: SHOULDER ARTHROSCOPIC ASSISTED ACROMIO-CLAVICULAR JOINT REPAIR;  Surgeon: Tania Ade, MD;  Location: [facility];  Service: Orthopedics;  Laterality: Left;   CATARACT EXTRACTION Bilateral    CORONARY  ARTERY BYPASS GRAFT     [place]   LEFT HEART CATH AND CORS/GRAFTS ANGIOGRAPHY N/A 09/19/2021   Procedure: LEFT HEART CATH AND CORS/GRAFTS  ANGIOGRAPHY;  Surgeon: Belva Crome, MD;  Location: [facility];  Service: Cardiovascular;  Laterality: N/A;   open heart surgery  09/09/1995-1998   5 bypass   REPAIR KNEE LIGAMENT     right   SHOULDER ARTHROSCOPY Left 05/05/2019   Procedure: ARTHROSCOPY SHOULDER;  Surgeon: Tania Ade, MD;  Location: [facility];  Service: Orthopedics;  Laterality: Left;   TONSILLECTOMY     TOTAL KNEE ARTHROPLASTY Left 05/29/2020   Procedure: LEFT TOTAL KNEE ARTHROPLASTY;  Surgeon: Melrose Nakayama, MD;  Location: [facility];  Service: Orthopedics;  Laterality: Left;    Current Outpatient Medications  Medication Sig Dispense Refill   allopurinol (ZYLOPRIM) 100 MG tablet TAKE 1 TABLET BY MOUTH TWICE A DAY 180 tablet 3   aspirin EC (ASPIRIN 81) 81 MG tablet Take 81 mg by mouth daily. Swallow whole.     Calcium Carbonate-Vit D-Min (CALCIUM-VITAMIN D-MINERALS) 600-800 MG-UNIT CHEW Chew 1 tablet by mouth daily.     colchicine 0.6 MG tablet Take 1 tablet (0.6 mg total) by mouth as needed (GOUT FLARE). TAKE 1 TABLET BY MOUTH TWICE A DAY WITH A MEAL AS NEEDED FOR ACUTE FLARE OF GOUT     Continuous Blood Gluc Receiver (FREESTYLE LIBRE 2 READER) DEVI USE AS INSTRUCTED TO CHECK BLOOD SUGARS. 1 each 0   Continuous Blood Gluc Sensor (FREESTYLE LIBRE 2 SENSOR) MISC 1 Device by Does not apply route every 14 (fourteen) days. 6 each 3   ezetimibe (ZETIA) 10 MG tablet TAKE 1 TABLET BY MOUTH EVERY DAY 90 tablet 3   famotidine (PEPCID) 20 MG tablet TAKE 1 TABLET BY MOUTH EVERYDAY AT BEDTIME (Patient taking differently: Take 20 mg by mouth at bedtime. TAKE 1 TABLET BY MOUTH EVERYDAY AT BEDTIME) 90 tablet 1   furosemide (LASIX) 20 MG tablet TAKE 1 TABLET BY MOUTH EVERY DAY 90 tablet 3   Glucosamine HCl (GLUCOSAMINE PO) Take 1,500 mg by mouth 2 (two) times daily.      glucose blood (FREESTYLE LITE) test strip 1 each by Other route 4 (four) times daily. E11.65 400 strip 0   insulin isophane & regular human KwikPen (NOVOLIN 70/30  KWIKPEN) (70-30) 100 UNIT/ML KwikPen Inject 40 Units into the skin daily with breakfast. And pen needles 1/day     Insulin Pen Needle 31G X 8 MM MISC Use as instructed to inject insulin 1x daily 100 each 2   Lancets (FREESTYLE) lancets 1 EACH BY OTHER ROUTE 4 (FOUR) TIMES DAILY. E11.65 400 each 0   Lutein 20 MG CAPS Take 20 mg by mouth daily.      metoprolol succinate (TOPROL-XL) 25 MG 24 hr tablet Take 0.5 tablets (12.5 mg total) by mouth at bedtime. 45 tablet 1   nitroGLYCERIN (NITROSTAT) 0.4 MG SL tablet Place 1 tablet (0.4 mg total) under the tongue every 5 (five) minutes x 3 doses as needed for chest pain. 30 tablet 12   omeprazole (PRILOSEC) 20 MG capsule TAKE 2 CAPSULES (40 MG TOTAL) BY MOUTH DAILY BEFORE BREAKFAST. (Patient taking differently: Take 40 mg by mouth daily before breakfast.) 180 capsule 3   polyethylene glycol (MIRALAX / GLYCOLAX) 17 g packet Take 17 g by mouth daily.     predniSONE (DELTASONE) 10 MG tablet Take 1 tablet (10 mg total) by mouth daily with breakfast. Take prednisone 30 mg (3  tabs) for 3 days, then continue your maintenance dose of prednisone  0   rosuvastatin (CRESTOR) 40 MG tablet Take 1 tablet (40 mg total) by mouth daily. 90 tablet 2   No current facility-administered medications for this visit.     Allergies:   Oxycodone-acetaminophen and Oxycodone-acetaminophen   Social History:  The patient  reports that he quit smoking about 17 years ago. His smoking use included cigars and cigarettes. He has a 3.00 pack-year smoking history. He has never used smokeless tobacco. He reports current alcohol use of about 1.0 standard drink of alcohol per week. He reports that he does not use drugs.   Family History:   family history includes Heart attack (age of onset: 5) in his son; Heart disease in his father and mother; Stroke in his father.    Review of Systems: Review of Systems  Constitutional: Negative.   HENT: Negative.    Respiratory:  Positive for shortness  of breath.   Cardiovascular: Negative.   Gastrointestinal: Negative.   Musculoskeletal: Negative.   Neurological: Negative.   Psychiatric/Behavioral: Negative.    All other systems reviewed and are negative.   PHYSICAL EXAM: VS:  BP 120/60 (BP Location: Left Arm, Patient Position: Sitting, Cuff Size: Normal)   Pulse 66   Ht '5\' 10"'$  (1.778 m)   Wt 243 lb 4 oz (110.3 kg)   SpO2 97%   BMI 34.90 kg/m  , BMI Body mass index is 34.9 kg/m.  Constitutional:  oriented to person, place, and time. No distress.  HENT:  Head: Grossly normal Eyes:  no discharge. No scleral icterus.  Neck: No JVD, no carotid bruits  Cardiovascular: Regular rate and rhythm, no murmurs appreciated Pulmonary/Chest: Clear to auscultation bilaterally, no wheezes or rails Abdominal: Soft.  no distension.  no tenderness.  Musculoskeletal: Normal range of motion Neurological:  normal muscle tone. Coordination normal. No atrophy Skin: Skin warm and dry Psychiatric: normal affect, pleasant  Recent Labs: 04/09/2022: TSH 2.25 08/17/2022: B Natriuretic Peptide 205.2 08/18/2022: ALT 18; Hemoglobin 13.1; Platelets 185 08/25/2022: BUN 40; Creatinine, Ser 2.62; Potassium 4.6; Sodium 140    Lipid Panel Lab Results  Component Value Date   CHOL 169 04/09/2022   HDL 51.80 04/09/2022   LDLCALC 33 02/14/2022   TRIG 325.0 (H) 04/09/2022    Wt Readings from Last 3 Encounters:  08/29/22 243 lb 4 oz (110.3 kg)  08/25/22 244 lb 8 oz (110.9 kg)  08/17/22 242 lb (109.8 kg)     ASSESSMENT AND PLAN:  Syncope No recent symptoms of near syncope or syncope  Coronary artery disease with stable angina, bypass cardiac catheterization early 2023 At the time it was felt vein graft to RCA lesion does not appear to be critical.  Intervention would likely not provide significant clinical benefit Denies anginal symptoms, cholesterol close to goal  Pure hypercholesterolemia - Plan: EKG 12-Lead Continue Crestor with  Zetia  Essential hypertension - Plan: EKG 12-Lead Continue Lasix, metoprolol Imdur on hold given orthostasis/syncope Blood pressure stable  Chronic moderate shortness of breath,  History of COPD exacerbation/Boop, followed by pulmonary Component of deconditioning, on chronic steroids  Controlled type 2 diabetes mellitus with diabetic nephropathy, unspecified long term insulin use status ([place]) - Followed by endocrinology, reports Jardiance held Recommend he talk with endocrine about Wilder [place]ade Reports he is off Ozempic  Morbid obesity due to excess calories ([place]) in setting of pred rx  -  Calorie restriction recommended   Total encounter time more than 30 minutes  Greater than 50% was spent in counseling and coordination of care with the patient      No orders of the defined types were placed in this encounter.     Signed, Esmond Plants, M.D., Ph.D. 08/29/2022  [place], [place]

## 2022-08-27 DIAGNOSIS — Z8619 Personal history of other infectious and parasitic diseases: Secondary | ICD-10-CM | POA: Diagnosis not present

## 2022-08-27 DIAGNOSIS — Z8719 Personal history of other diseases of the digestive system: Secondary | ICD-10-CM | POA: Diagnosis not present

## 2022-08-27 DIAGNOSIS — Z09 Encounter for follow-up examination after completed treatment for conditions other than malignant neoplasm: Secondary | ICD-10-CM | POA: Diagnosis not present

## 2022-08-29 ENCOUNTER — Encounter: Payer: Self-pay | Admitting: Cardiovascular Disease

## 2022-08-29 ENCOUNTER — Encounter: Payer: Self-pay | Admitting: Internal Medicine

## 2022-08-29 ENCOUNTER — Ambulatory Visit: Payer: PPO | Attending: Cardiovascular Disease | Admitting: Cardiovascular Disease

## 2022-08-29 ENCOUNTER — Ambulatory Visit (INDEPENDENT_AMBULATORY_CARE_PROVIDER_SITE_OTHER): Payer: PPO | Admitting: Internal Medicine

## 2022-08-29 VITALS — BP 120/74 | HR 76 | Temp 97.6°F | Ht 70.5 in | Wt 244.8 lb

## 2022-08-29 VITALS — BP 120/60 | HR 66 | Ht 70.0 in | Wt 243.2 lb

## 2022-08-29 DIAGNOSIS — I25118 Atherosclerotic heart disease of native coronary artery with other forms of angina pectoris: Secondary | ICD-10-CM

## 2022-08-29 DIAGNOSIS — E1169 Type 2 diabetes mellitus with other specified complication: Secondary | ICD-10-CM | POA: Diagnosis not present

## 2022-08-29 DIAGNOSIS — R600 Localized edema: Secondary | ICD-10-CM

## 2022-08-29 DIAGNOSIS — E785 Hyperlipidemia, unspecified: Secondary | ICD-10-CM | POA: Diagnosis not present

## 2022-08-29 DIAGNOSIS — E1122 Type 2 diabetes mellitus with diabetic chronic kidney disease: Secondary | ICD-10-CM

## 2022-08-29 DIAGNOSIS — R55 Syncope and collapse: Secondary | ICD-10-CM

## 2022-08-29 DIAGNOSIS — R918 Other nonspecific abnormal finding of lung field: Secondary | ICD-10-CM

## 2022-08-29 DIAGNOSIS — N1832 Chronic kidney disease, stage 3b: Secondary | ICD-10-CM

## 2022-08-29 DIAGNOSIS — I10 Essential (primary) hypertension: Secondary | ICD-10-CM

## 2022-08-29 DIAGNOSIS — I5032 Chronic diastolic (congestive) heart failure: Secondary | ICD-10-CM

## 2022-08-29 NOTE — Progress Notes (Signed)
Subjective:   Patient ID: Kurt Aguilar, male    DOB: Jan 22, 1946  MRN: [id number]    Brief patient profile:  60 yowm never cigarette smoker (just some cigars)  after cabg in [place] started noting recurrent winter cough regardless of where located in [place] ([place]/ [place]) with typical acute episode in [place] in Feb 2017  This  cough  more dry less severe than previous >  abx and pred > improved but did not resolve and worse when stopped it so restarted pred/abx > dx as pneumonia with variable as dz > referred to pulmonary clinic 12/19/2015 by [name] with 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   History of Present Illness  12/19/2015 1st [facility] office visit/ Christean Silvestri   Chief Complaint  Patient presents with   Pulmonary Consult    Referred by [name]. Pt c/o cough x 4 wks- non prod and worse in the evening and when he lies down. Talking and exertion are things that trigger the cough. He has been on pred x 2 and both times cough resolved and immediately returns once done with med. He also c/o SOB- gets winded just walking from room to room at home.   while on prednisone still some weak and sob but better cough and last dose at least one week and worse overall since off it assoc with sob room to room and weakness/ excess/ purulent sputum or mucus plugs / no unusual exp / no ctd.  Has new CPAP machine fall 2016  rec  Omeprazole 20 mg Take 30- 60 min before your first and last meals of the day  Prednisone 10  X 2 each day until 100% better then 10 mg daily  Dx is Brochilitis obliterans with organizing pneumonia vs eosiniphic pneumonia most likely diagnosis GERD  Diet   Symptoms onset Dec 4th 2022  [place] 08/26/2021  acute visit  Prometh codeine    cough syrup Benzonatate perles- for cough Molnupiravir rx  and zpak  >>>  walking room to room at home/ cough never improved and pred rx up to 40 mg   Admit date:     09/17/2021   Discharge date: 09/24/21  Discharge  Physician: [name]     Discharge Diagnoses     Acute coronary syndrome / NSTEMI(HCC) Acute kidney injury on CKD stage IIIb Lactic acidosis Shortness of breath with coughing   Essential hypertension   Right bundle branch block   CAD (coronary artery disease)   DM2 (diabetes mellitus, type 2) (HCC)   Steroid dependent (HCC)   AKI (acute kidney injury) ([place])   Stage 3b chronic kidney disease (CKD) [facility])         Hospital Course     Patient is a 76 year old male with history of CAD status post CABG in 1997, DM2, HTN, HLD, Boop, recent COVID-19 infection in December 2022, chronic steroid dependency presented to ED with mild intermittent chest pain for a month, worse with exertion, dyspnea.  Patient was found to have elevated troponins and was admitted for further work-up    NSTEMI [facility]) with known history of CAD status post CABG x5 in 1997 -2D echo showed no wall motion abnormality, normal EF, likely demand ischemia -Left heart cardiac cath showed multivessel native coronary artery diseasesaphenous vein to right coronary artery with 70% ostial to proximal stenosis, SVG to OM 1 and 2 patent, SVG to first diagonal patent, LIMA to LAD patent.  -Continue aspirin, beta-blocker, Crestor, Zetia,  Essential hypertension -BP stable, continue beta-blocker    Hyperkalemia -Resolved   Acute on chronic CKD stage IIIb, lactic acidosis -Creatinine 2.34 on admission, baseline around 1.8-1.9 -Creatinine improved, close to baseline.  Completed IV fluid hydration.      Shortness of breath with cough -Per patient 4 weeks, dry persistent cough, possibly due to COVID-19 in December (positive on 12/4), underlying history of BOOP -CXR with no pneumonia or effusion.  VQ scan negative for PE.  Normal BNP -Continues to feel winded with exertion, coughing spasms. -Continue prednisone 40 mg daily with a taper upon discharge. (Outpatient on 10 mg BID) -Continue scheduled nebs, incentive  spirometry, Tessalon Perles  -Home O2 evaluation done on 1/15, did not meet criteria for home O2   Hyperlipidemia -LDL 74 in 04/2021.  Continue Crestor 40 mg daily, Zetia 10 mg daily   Diabetes mellitus type 2, on long-term insulin with renal complications -Hemoglobin A1c 9.5 on 09/13/2021 --Continue insulin regimen, hopefully CBGs will continue to improve once prednisone taper is completed and back to maintenance dose   BOOP - follows Dr [name] outpatient, continue prednisone     Obesity Estimated body mass index is 32.39 kg/m as calculated from the following:   Height as of this encounter: '5\' 10"'$  (1.778 m).   Weight as of this encounter: 102.4 kg.     05/29/2022  f/u ov/Snigdha Howser/  Clinic re: BOOP  maint on prednisone 10 mg  with worse cough at 5 mg  Chief Complaint  Patient presents with   Follow-up    More sob with less activity and more fatigued over the past few weeks.  Sleeps a lot, wears a CPAP.  Dyspnea:  no exercise / walking 25-50 ft max with fatigue limiting and presyncope but less sob and no cp Cough: better at 10 mg daily  Sleeping: cpap / 30 degrees up  SABA use: none 02: none  Light headed standing x months rx by Cards Rec No change rx     08/29/2022  f/u ov/Ziair Penson re: BOOP    maint on 10 mg pred / day   Chief Complaint  Patient presents with   Follow-up    SOB and heaviness in chest x 1 week.  In hospital for dehydration.  [facility] did increase prednisone to 40 mg x 3 days, then back to 10 mg a day.   Not able to get a deep breath.  Dyspnea:  worse on 10 mg vs 40 doing some bicycle but 10 min limit s desats Cough: mostly with talking/ dry quality daytime  Sleeping: cpap / 30 degrees  SABA use: none  02: none  Classic AP rx medically per cards / no change in pattern / intensity.     No obvious day to day or daytime variability or assoc excess/ purulent sputum or mucus plugs or hemoptysis or   subjective wheeze or overt sinus or hb symptoms.    Fine as above  without nocturnal  or early am exacerbation  of respiratory  c/o's or need for noct saba. Also denies any obvious fluctuation of symptoms with weather or environmental changes or other aggravating or alleviating factors except as outlined above   No unusual exposure hx or h/o childhood pna/ asthma or knowledge of premature birth.  Current Allergies, Complete Past Medical History, Past Surgical History, Family History, and Social History were reviewed in [employer] record.  ROS  The following are not active complaints unless bolded Hoarseness, sore throat, dysphagia,  dental problems, itching, sneezing,  nasal congestion or discharge of excess mucus or purulent secretions, ear ache,   fever, chills, sweats, unintended wt loss or wt gain, classically pleuritic   cp,  orthopnea pnd or arm/hand swelling  or leg swelling, presyncope, palpitations, abdominal pain, anorexia, nausea, vomiting, diarrhea  or change in bowel habits or change in bladder habits, change in stools or change in urine, dysuria, hematuria,  rash, arthralgias, visual complaints, headache, numbness, weakness or ataxia or problems with walking or coordination,  change in mood or  memory.        Current Meds  Medication Sig   allopurinol (ZYLOPRIM) 100 MG tablet TAKE 1 TABLET BY MOUTH TWICE A DAY   aspirin EC (ASPIRIN 81) 81 MG tablet Take 81 mg by mouth daily. Swallow whole.   Calcium Carbonate-Vit D-Min (CALCIUM-VITAMIN D-MINERALS) 600-800 MG-UNIT CHEW Chew 1 tablet by mouth daily.   colchicine 0.6 MG tablet Take 1 tablet (0.6 mg total) by mouth as needed (GOUT FLARE). TAKE 1 TABLET BY MOUTH TWICE A DAY WITH A MEAL AS NEEDED FOR ACUTE FLARE OF GOUT   Continuous Blood Gluc Receiver (FREESTYLE LIBRE 2 READER) DEVI USE AS INSTRUCTED TO CHECK BLOOD SUGARS.   Continuous Blood Gluc Sensor (FREESTYLE LIBRE 2 SENSOR) MISC 1 Device by Does not apply route every 14 (fourteen) days.   ezetimibe (ZETIA)  10 MG tablet TAKE 1 TABLET BY MOUTH EVERY DAY   famotidine (PEPCID) 20 MG tablet TAKE 1 TABLET BY MOUTH EVERYDAY AT BEDTIME (Patient taking differently: Take 20 mg by mouth at bedtime. TAKE 1 TABLET BY MOUTH EVERYDAY AT BEDTIME)   furosemide (LASIX) 20 MG tablet TAKE 1 TABLET BY MOUTH EVERY DAY   Glucosamine HCl (GLUCOSAMINE PO) Take 1,500 mg by mouth 2 (two) times daily.    glucose blood (FREESTYLE LITE) test strip 1 each by Other route 4 (four) times daily. E11.65   insulin isophane & regular human KwikPen (NOVOLIN 70/30 KWIKPEN) (70-30) 100 UNIT/ML KwikPen Inject 40 Units into the skin daily with breakfast. And pen needles 1/day   Insulin Pen Needle 31G X 8 MM MISC Use as instructed to inject insulin 1x daily   Lancets (FREESTYLE) lancets 1 EACH BY OTHER ROUTE 4 (FOUR) TIMES DAILY. E11.65   Lutein 20 MG CAPS Take 20 mg by mouth daily.    metoprolol succinate (TOPROL-XL) 25 MG 24 hr tablet Take 0.5 tablets (12.5 mg total) by mouth at bedtime.   nitroGLYCERIN (NITROSTAT) 0.4 MG SL tablet Place 1 tablet (0.4 mg total) under the tongue every 5 (five) minutes x 3 doses as needed for chest pain.   omeprazole (PRILOSEC) 20 MG capsule TAKE 2 CAPSULES (40 MG TOTAL) BY MOUTH DAILY BEFORE BREAKFAST. (Patient taking differently: Take 40 mg by mouth daily before breakfast.)   polyethylene glycol (MIRALAX / GLYCOLAX) 17 g packet Take 17 g by mouth daily.   predniSONE (DELTASONE) 10 MG tablet Take 1 tablet (10 mg total) by mouth daily with breakfast. Take prednisone 30 mg (3 tabs) for 3 days, then continue your maintenance dose of prednisone   rosuvastatin (CRESTOR) 40 MG tablet Take 1 tablet (40 mg total) by mouth daily.              Objective:   Physical Exam   Wts  08/29/2022     244  05/29/2022       239 02/14/2022         238  11/15/2021  236  10/04/2021        230  05/22/2021        230   01/15/2021       231  09/26/2020        220  11/24/2019        206  09/20/2018        222  08/26/2018       222 07/29/2017      232  04/15/2017          243  02/10/2017          250  12/19/2016        250 11/11/2016          258  09/11/2016          252  07/29/2016      244  05/16/2016          238  02/01/2016        235  01/07/2016          228   12/19/15 229 lb 4 oz (103.987 kg)  12/19/15 229 lb 4 oz (103.987 kg)  12/19/15 227 lb (102.967 kg)    Vital signs reviewed  08/29/2022  - Note at rest 02 sats  96% on RA   General appearance:    slt cushingnoid mod obese amb  wm  nad   HEENT : Oropharynx  clear     Nasal turbinates nl    NECK :  without  apparent JVD/ palpable Nodes/TM    LUNGS: no acc muscle use, slt kyphotic chest with small buffalo hump/ clear to A and P bilaterally without cough on insp or exp maneuvers   CV:  RRR  no s3 or murmur or increase in P2, and no significant edema   ABD: obese soft and nontender    MS:  walks with cane, slow gait/ ext warm without deformities Or obvious joint restrictions  calf tenderness, cyanosis or clubbing    SKIN: warm and dry without lesions    NEURO:  alert, approp, nl sensorium with  no motor or cerebellar deficits apparent.           Assessment & Plan:

## 2022-08-29 NOTE — Assessment & Plan Note (Signed)
Onset of symptoms feb 2017 with "photo neg pumonary edema pattern" Baseline PFTs 03/18/10  FVC  3.59 (80%) with ERV 67% and dlco 75 corrects to 106 - Allergy profile 12/19/15 >  Eos 0.2 /  IgE  89/ Pos Cat RAST only  12/19/2015  ESR 93 > started on prednisone 20 mg daily  HSP profile 12/19/15 >  Neg  - 01/07/2016 ESR down to 18 but only 30% improved symptomatically > rec continue 20 mg daily until better then 10 mg daily  - 02/01/2016 rec taper off x 6 weeks then ov > 05/16/2016 ESR = 58> restarted pred 06/03/16  - Collagen Vasc dz screen 07/29/2016  Neg  - PFT's  11/11/2016  FEV1 1.91 (56 % ) ratio 72  p 7 % improvement from saba p nothing prior to study with DLCO  42/43 % corrects to 77 % for alv volume but ERV 35%  - ESR  20 on 10 a/w 15 as  Of 11/11/2016 > rec try 10 mg daily  - Prednisone 5 mg daily as of 12/15/16 > taper to 5 mg qod by 02/10/2017 - 02/10/2017 ESR =  28  rec taper off x 2 weeks > no flare - 04/20/2017 ESR = 32 off pred since 02/2017  - 08/12/18  ESR = 16 with onset of cough  -  Cough recurred similar to Boop late November 2021  >  08/30/2020   Prednisone 10 mg x 2 daily until better then one daily until seen > cxr 09/26/2020 on 10 mg daily = wnl so rec taper off over 10 days  and see if cough flares  - 01/15/2021 flared again with taper to 5 mg so new floor of 10/5 and ceiling of 20 mg - 01/15/2021 rec bone density per PCP > osteopenia only - 05/29/2022   Walked on RA  x  2  lap(s) =  approx 350  ft  @ very slow pace, stopped due to tired/dizzy  with lowest 02 sats 94%  - 08/29/2022   Walked on RA  x  2   lap(s) =  approx 500  ft  @ slow/cane  pace, stopped due to cp, sob  with lowest 02 sats 94%  - cp and sob rapidly resolved at rest   Unfortunately now has component of AP related to wob but not ex sats so no options to improve this thru 02 rx but needs to learn to pace slower and will continue pred at 10 mg floor and 20 mg ceiling.   F/u q 3 m, call sooner if needed  Each maintenance medication  was reviewed in detail including emphasizing most importantly the difference between maintenance and prns and under what circumstances the prns are to be triggered using an action plan format where appropriate.  Total time for H and P, chart review, counseling,  directly observing portions of ambulatory 02 saturation study/ and generating customized AVS unique to this office visit / same day charting = 25 min

## 2022-08-29 NOTE — Patient Instructions (Addendum)
Medication Instructions:  No changes  Ask endocrine about farxiga in setting of kidney issues  If you need a refill on your cardiac medications before your next appointment, please call your pharmacy.   Lab work: No new labs needed  Testing/Procedures: No new testing needed  Follow-Up: At [facility], you and your health needs are our priority.  As part of our continuing mission to provide you with exceptional heart care, we have created designated Provider Care Teams.  These Care Teams include your primary Cardiologist (physician) and Advanced Practice Providers (APPs -  Physician Assistants and Nurse Practitioners) who all work together to provide you with the care you need, when you need it.  You will need a follow up appointment in 12 months  Providers on your designated Care Team:   Murray Hodgkins, NP Christell Faith, PA-C Cadence Kathlen Mody, [place]  COVID-19 Vaccine Information can be found at: [email or web address] For questions related to vaccine distribution or appointments, please email [email or web address] or call [phone].

## 2022-08-29 NOTE — Patient Instructions (Signed)
Ceiling on prednisone is 20 mg per day taken at breakfast - floor is 10 mg daily   Please schedule a follow up visit in 3 months but call sooner if needed

## 2022-09-05 ENCOUNTER — Ambulatory Visit (INDEPENDENT_AMBULATORY_CARE_PROVIDER_SITE_OTHER): Payer: PPO | Admitting: Internal Medicine

## 2022-09-05 ENCOUNTER — Encounter: Payer: Self-pay | Admitting: Internal Medicine

## 2022-09-05 ENCOUNTER — Other Ambulatory Visit: Payer: Self-pay | Admitting: Internal Medicine

## 2022-09-05 ENCOUNTER — Other Ambulatory Visit: Payer: Self-pay

## 2022-09-05 VITALS — BP 138/98 | HR 68 | Ht 70.5 in | Wt 246.6 lb

## 2022-09-05 DIAGNOSIS — E1159 Type 2 diabetes mellitus with other circulatory complications: Secondary | ICD-10-CM

## 2022-09-05 DIAGNOSIS — E785 Hyperlipidemia, unspecified: Secondary | ICD-10-CM

## 2022-09-05 DIAGNOSIS — E1169 Type 2 diabetes mellitus with other specified complication: Secondary | ICD-10-CM | POA: Diagnosis not present

## 2022-09-05 DIAGNOSIS — E1165 Type 2 diabetes mellitus with hyperglycemia: Secondary | ICD-10-CM | POA: Diagnosis not present

## 2022-09-05 LAB — POCT GLYCOSYLATED HEMOGLOBIN (HGB A1C): Hemoglobin A1C: 7.9 % — AB (ref 4.0–5.6)

## 2022-09-05 MED ORDER — OZEMPIC (2 MG/DOSE) 8 MG/3ML ~~LOC~~ SOPN
2.0000 mg | PEN_INJECTOR | SUBCUTANEOUS | 3 refills | Status: DC
  Filled 2022-09-05: qty 9, 84d supply, fill #0
  Filled 2022-11-10: qty 3, 28d supply, fill #1
  Filled 2022-12-16: qty 9, 84d supply, fill #2
  Filled 2022-12-16: qty 3, 28d supply, fill #2
  Filled 2023-03-16: qty 9, 84d supply, fill #3

## 2022-09-05 MED ORDER — NOVOLOG FLEXPEN 100 UNIT/ML ~~LOC~~ SOPN: 12.0000 [IU] | PEN_INJECTOR | Freq: Two times a day (BID) | SUBCUTANEOUS | 2 refills | Status: DC

## 2022-09-05 MED ORDER — INSULIN PEN NEEDLE 32G X 4 MM MISC: 3 refills | Status: DC

## 2022-09-05 MED ORDER — OZEMPIC (0.25 OR 0.5 MG/DOSE) 2 MG/1.5ML ~~LOC~~ SOPN: 0.5000 mg | PEN_INJECTOR | SUBCUTANEOUS | 3 refills | Status: DC

## 2022-09-05 MED ORDER — HUMALOG KWIKPEN 200 UNIT/ML ~~LOC~~ SOPN: PEN_INJECTOR | SUBCUTANEOUS | 3 refills | Status: DC

## 2022-09-05 MED ORDER — NOVOLIN 70/30 FLEXPEN (70-30) 100 UNIT/ML ~~LOC~~ SUPN: 45.0000 [IU] | PEN_INJECTOR | Freq: Every day | SUBCUTANEOUS | 3 refills | Status: DC

## 2022-09-05 NOTE — Patient Instructions (Addendum)
Please continue: - NPH/regular insulin 70/30 45 units in am  Try to restart: - Ozempic 2 mg weekly  If you cannot get it, start: - Novolog 12-15 units before lunch and dinner  Please return in 3-4 months.

## 2022-09-05 NOTE — Progress Notes (Signed)
Patient ID: Kurt Aguilar, male   DOB: 09/23/1945, 76 y.o.   MRN: [id number]  HPI: Kurt Aguilar is a 76 y.o.-year-old male, returning for follow-up for DM2, dx in 2014, insulin-dependent in 2018-2019 and again since 2023, uncontrolled, with complications (CAD - s/p AMIs, diastolic CHF, CKD stage III, peripheral neuropathy). Pt. previously saw Dr. Loanne Drilling, but last visit 4 months ago.   He is here with his wife who offers part of the history, especially regarding his diabetic medications, past medical history, and diet.  Interim history: He continues to have increased urination, also blurry vision. No nausea, chest pain. At last visit he had dizziness and had an instance in which he passed out.  No recent episodes. He was recently admitted for dehydration 2/2 viral gastroenteritic >> taken off Jardiance and also he is also off Ozempic 2/2 unavailability.  Moreover, he is on the higher dose of prednisone, currently 20 mg daily, which causes him to be quite hungry.  He gained 7 pounds since last visit.  Reviewed HbA1c: Lab Results  Component Value Date   HGBA1C 6.6 (A) 05/06/2022   HGBA1C 6.7 (A) 01/09/2022   HGBA1C 8.0 (A) 11/14/2021   HGBA1C 9.5 (A) 09/13/2021   HGBA1C 6.5 (A) 08/15/2020   HGBA1C 6.6 (H) 05/17/2020   HGBA1C 6.3 (A) 02/10/2020   HGBA1C 5.8 (A) 08/15/2019   HGBA1C 6.3 03/24/2019   HGBA1C 7.2 (A) 02/10/2019   Pt is on a regimen of: - NPH/regular insulin 70/30 35 units in am (30 min before b'fast) >> 45 units in am (7 am) - Jardiance 10 mg after breakfast >> before breakfast- taken off by nephrology 2 weeks ago - Ozempic 2 mg weekly - off 2/2 availability at the pharmacy  Pt checks his sugars >4x a day and they are:  Previously:  Lowest sugar was 66 >> 119; he has hypoglycemia awareness at 70.  Highest sugar was 254 >> 370s.  Glucometer: Freestyle lite  Meals: - breakfast: eggs and bacon; eggs benedict; oats and toast with PB - lunch: pork chops + mashed  potatoes + succotash or similar; pasta - dinner: spaghetti + salad + cookie  - +CKD-sees nephrology-Dr. Candiss Norse, last BUN/creatinine:  Lab Results  Component Value Date   BUN 40 (H) 08/25/2022   BUN 44 (H) 08/19/2022   CREATININE 2.62 (H) 08/25/2022   CREATININE 2.42 (H) 08/19/2022  On lisinopril 2.5 mg daily.  -+ HL; last set of lipids: Lab Results  Component Value Date   CHOL 169 04/09/2022   HDL 51.80 04/09/2022   LDLCALC 33 02/14/2022   LDLDIRECT 78.0 04/09/2022   TRIG 325.0 (H) 04/09/2022   CHOLHDL 3 04/09/2022  On Crestor 40 mg daily and Zetia 10 mg daily.  - last eye exam was in 10/02/2021. No DR.   - no numbness and tingling in his feet.  Last foot exam 04/16/2022.  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  ROS: + see HPI  Past Medical History:  Diagnosis Date   Arthritis    Chronic kidney disease    Complication of anesthesia    constipation and inability to  urinate happened a few days after cataract surgery    Diabetes ([place])    type 2    Diverticulitis    GERD (gastroesophageal reflux disease)    Gout    Heart attack ([place])    Heart disease    Heart murmur  Hyperglycemia    Hyperlipidemia    Hypertension    Osteoporosis    Pneumonia    hx of BOOP followed by Dr [name]    Sleep apnea    cpapp - setting at 15    Past Surgical History:  Procedure Laterality Date   ACROMIO-CLAVICULAR JOINT REPAIR Left 05/05/2019   Procedure: SHOULDER ARTHROSCOPIC ASSISTED ACROMIO-CLAVICULAR JOINT REPAIR;  Surgeon: Tania Ade, MD;  Location: [facility];  Service: Orthopedics;  Laterality: Left;   CATARACT EXTRACTION Bilateral    CORONARY ARTERY BYPASS GRAFT     [place]   LEFT HEART CATH AND CORS/GRAFTS ANGIOGRAPHY N/A 09/19/2021   Procedure: LEFT HEART CATH AND CORS/GRAFTS ANGIOGRAPHY;  Surgeon: Belva Crome, MD;  Location: [place] [facility];  Service:  Cardiovascular;  Laterality: N/A;   open heart surgery  09/09/1995-1998   5 bypass   REPAIR KNEE LIGAMENT     right   SHOULDER ARTHROSCOPY Left 05/05/2019   Procedure: ARTHROSCOPY SHOULDER;  Surgeon: Tania Ade, MD;  Location: [facility];  Service: Orthopedics;  Laterality: Left;   TONSILLECTOMY     TOTAL KNEE ARTHROPLASTY Left 05/29/2020   Procedure: LEFT TOTAL KNEE ARTHROPLASTY;  Surgeon: Melrose Nakayama, MD;  Location: [facility];  Service: Orthopedics;  Laterality: Left;   Social History   Socioeconomic History   Marital status: Married    Spouse name: Not on file   Number of children: 4   Years of education: 14   Highest education level: Not on file  Occupational History   Occupation: Regulatory affairs officer    Employer: RETIRED  Tobacco Use   Smoking status: Former    Packs/day: 1.00    Years: 3.00    Total pack years: 3.00    Types: Cigars, Cigarettes    Quit date: 11/25/2004    Years since quitting: 17.7   Smokeless tobacco: Never   Tobacco comments:    occas. cigar quit 2006  Vaping Use   Vaping Use: Never used  Substance and Sexual Activity   Alcohol use: Yes    Alcohol/week: 1.0 standard drink of alcohol    Types: 1 Standard drinks or equivalent per week    Comment: rare   Drug use: No   Sexual activity: Never  Other Topics Concern   Not on file  Social History Narrative   Not on file   Social Determinants of Health   Financial Resource Strain: Low Risk  (04/17/2022)   Overall Financial Resource Strain (CARDIA)    Difficulty of Paying Living Expenses: Not hard at all  Food Insecurity: No Food Insecurity (08/20/2022)   Hunger Vital Sign    Worried About Running Out of Food in the Last Year: Never true    Ran Out of Food in the Last Year: Never true  Transportation Needs: No Transportation Needs (08/20/2022)   PRAPARE - Hydrologist (Medical): No    Lack of Transportation (Non-Medical): No  Physical Activity: Insufficiently Active  (04/17/2022)   Exercise Vital Sign    Days of Exercise per Week: 5 days    Minutes of Exercise per Session: 20 min  Stress: No Stress Concern Present (04/17/2022)   [place]    Feeling of Stress : Only a little  Social Connections: Moderately Integrated (04/17/2022)   Social Connection and Isolation Panel [NHANES]    Frequency of Communication with Friends and Family: More than three times a week    Frequency  of Social Gatherings with Friends and Family: Once a week    Attends Religious Services: More than 4 times per year    Active Member of [employer] or Organizations: No    Attends Archivist Meetings: Never    Marital Status: Married  Human resources officer Violence: Not At Risk (08/18/2022)   Humiliation, Afraid, Rape, and Kick questionnaire    Fear of Current or Ex-Partner: No    Emotionally Abused: No    Physically Abused: No    Sexually Abused: No   Current Outpatient Medications on File Prior to Visit  Medication Sig Dispense Refill   allopurinol (ZYLOPRIM) 100 MG tablet TAKE 1 TABLET BY MOUTH TWICE A DAY 180 tablet 3   aspirin EC (ASPIRIN 81) 81 MG tablet Take 81 mg by mouth daily. Swallow whole.     Calcium Carbonate-Vit D-Min (CALCIUM-VITAMIN D-MINERALS) 600-800 MG-UNIT CHEW Chew 1 tablet by mouth daily.     colchicine 0.6 MG tablet Take 1 tablet (0.6 mg total) by mouth as needed (GOUT FLARE). TAKE 1 TABLET BY MOUTH TWICE A DAY WITH A MEAL AS NEEDED FOR ACUTE FLARE OF GOUT     Continuous Blood Gluc Receiver (FREESTYLE LIBRE 2 READER) DEVI USE AS INSTRUCTED TO CHECK BLOOD SUGARS. 1 each 0   Continuous Blood Gluc Sensor (FREESTYLE LIBRE 2 SENSOR) MISC 1 Device by Does not apply route every 14 (fourteen) days. 6 each 3   ezetimibe (ZETIA) 10 MG tablet TAKE 1 TABLET BY MOUTH EVERY DAY 90 tablet 3   famotidine (PEPCID) 20 MG tablet TAKE 1 TABLET BY MOUTH EVERYDAY AT BEDTIME (Patient taking differently: Take 20 mg by  mouth at bedtime. TAKE 1 TABLET BY MOUTH EVERYDAY AT BEDTIME) 90 tablet 1   furosemide (LASIX) 20 MG tablet TAKE 1 TABLET BY MOUTH EVERY DAY 90 tablet 3   Glucosamine HCl (GLUCOSAMINE PO) Take 1,500 mg by mouth 2 (two) times daily.      glucose blood (FREESTYLE LITE) test strip 1 each by Other route 4 (four) times daily. E11.65 400 strip 0   insulin isophane & regular human KwikPen (NOVOLIN 70/30 KWIKPEN) (70-30) 100 UNIT/ML KwikPen Inject 40 Units into the skin daily with breakfast. And pen needles 1/day     Insulin Pen Needle 31G X 8 MM MISC Use as instructed to inject insulin 1x daily 100 each 2   Lancets (FREESTYLE) lancets 1 EACH BY OTHER ROUTE 4 (FOUR) TIMES DAILY. E11.65 400 each 0   Lutein 20 MG CAPS Take 20 mg by mouth daily.      metoprolol succinate (TOPROL-XL) 25 MG 24 hr tablet Take 0.5 tablets (12.5 mg total) by mouth at bedtime. 45 tablet 1   nitroGLYCERIN (NITROSTAT) 0.4 MG SL tablet Place 1 tablet (0.4 mg total) under the tongue every 5 (five) minutes x 3 doses as needed for chest pain. 30 tablet 12   omeprazole (PRILOSEC) 20 MG capsule TAKE 2 CAPSULES (40 MG TOTAL) BY MOUTH DAILY BEFORE BREAKFAST. (Patient taking differently: Take 40 mg by mouth daily before breakfast.) 180 capsule 3   polyethylene glycol (MIRALAX / GLYCOLAX) 17 g packet Take 17 g by mouth daily.     predniSONE (DELTASONE) 10 MG tablet Take 1 tablet (10 mg total) by mouth daily with breakfast. Take prednisone 30 mg (3 tabs) for 3 days, then continue your maintenance dose of prednisone  0   rosuvastatin (CRESTOR) 40 MG tablet Take 1 tablet (40 mg total) by mouth daily. 90 tablet 2  No current facility-administered medications on file prior to visit.   Allergies  Allergen Reactions   Oxycodone-Acetaminophen Other (See Comments)    Stops breathing; has tolerated Tylenol before   Oxycodone-Acetaminophen Anaphylaxis   Family History  Problem Relation Age of Onset   Heart disease Mother    Heart disease Father     Stroke Father    Heart attack Son 65   Kidney disease Neg Hx    Prostate cancer Neg Hx    Diabetes Neg Hx    PE: BP (!) 138/98 (BP Location: Left Arm, Patient Position: Sitting, Cuff Size: Normal)   Pulse 68   Ht 5' 10.5" (1.791 m)   Wt 246 lb 9.6 oz (111.9 kg)   SpO2 98%   BMI 34.88 kg/m  Wt Readings from Last 3 Encounters:  09/05/22 246 lb 9.6 oz (111.9 kg)  08/29/22 244 lb 12.8 oz (111 kg)  08/29/22 243 lb 4 oz (110.3 kg)   Constitutional: overweight, in NAD Eyes: no exophthalmos ENT: no thyromegaly, no cervical lymphadenopathy Cardiovascular: RRR, No RG, + 1/6 SEM, + B pitting LE edema Respiratory: CTA B Musculoskeletal: no deformities Skin: no rashes Neurological: no tremor with outstretched hands  ASSESSMENT: 1. DM2, insulin-dependent, uncontrolled, without long-term complications - CAD - diastolic CHF - CKD stage III - peripheral neuropathy  2.  Hyperlipidemia  PLAN:  1. Patient with longstanding, uncontrolled, type 2 diabetes, on oral antidiabetic regimen with SGLT2 inhibitor, also weekly GLP-1 receptor agonist and premixed insulin regimen in the morning only.  At last visit, HbA1c was lower, at 6.6%.  Sugars were excellent overnight and occasionally higher after breakfast, with a slight increase after lunch and dinner.  We discussed about improving breakfast.  He was eating a high-fat breakfast every morning.  I advised him to try oatmeal.  We also discussed about moving Jardiance before breakfast, as he was taking it afterwards.  The pattern of the blood sugars did not mandate increasing the 70/30 insulin to twice a day.  He did have some low blood sugars occasionally during the night, in the 60s and we discussed about checking with the glucometer when this happens, during correction of the low.  We also discussed about how to correct the lows. CGM interpretation: -At today's visit, we reviewed his CGM downloads: It appears that 41% of values are in target range  (goal >70%), while 59% are higher than 180 (goal <25%), and 0% are lower than 70 (goal <4%).  The calculated average blood sugar is 207.  The projected HbA1c for the next 3 months (GMI) is 8.3%. -Reviewing the CGM trends, sugars have been improving overnight, but they increase after approximately 8 AM in a stepwise fashion, with the largest increases after lunch and dinner. -Upon questioning, he was not able to obtain Ozempic consistently since last visit and he was off and on the medication but finally came off as he could not find it anymore.  Also, at the beginning of the month, he was admitted for gastroenteritis with dehydration.  At that time, he had lactic acidosis and AKI.  Nephrology recommended to stop Jardiance and not restarted in the future.  He is, therefore, only on NPH/regular insulin 70/30 and increase the dose from 35 to 45 units in the morning. -At this visit, we discussed about other ways to obtain Ozempic.  I gave him a written prescription to try the pharmacy downstairs and advised him to also try other pharmacies.  However, we also discussed that the  mail-order pharmacies have a current stock of Ozempic and recommended to discuss with insurance to see which mail-order pharmacy they can use. -However, if he is not able to get Ozempic, and also  I sent the prescription for NovoLog to his pharmacy sugars increase significantly later in the day, especially after lunch and dinner, and discussed about taking this 15 minutes before lunch and dinner.  Since he is approximately equal meals, I recommended approximately the same dose before each of the 2 meals.  We also discussed that even if he gets Ozempic, since he is off the SGLT2 inhibitor, he may need some regular insulin before lunch and dinner. -They wonder whether Wilder Glade can be used instead of Jardiance but I explained that they work through the same mechanism and should not be used based on recommendations from nephrology.  Reviewing the  chart, the latest GFR was 22. - I suggested to:  Patient Instructions  Please continue: - NPH/regular insulin 70/30 45 units in am  Try to restart: - Ozempic 2 mg weekly  If you cannot get it, start: - Novolog 12-15 units before lunch and dinner  Please return in 3-4 months.   - we checked his HbA1c: 7.9% (higher) - advised to check sugars at different times of the day - 2x a day, rotating check times - advised for yearly eye exams >> he is UTD - return to clinic in 4 months  2.  Hyperlipidemia -Reviewed latest lipid panel from 04/2022: LDL above our goal of less than 55 due to cardiovascular disease, triglycerides high: Lab Results  Component Value Date   CHOL 169 04/09/2022   HDL 51.80 04/09/2022   LDLCALC 33 02/14/2022   LDLDIRECT 78.0 04/09/2022   TRIG 325.0 (H) 04/09/2022   CHOLHDL 3 04/09/2022  -He continues on Crestor 40 mg daily and Zetia 10 mg daily without side effects   Philemon Kingdom, MD PhD [facility] Endocrinology

## 2022-09-29 DIAGNOSIS — L218 Other seborrheic dermatitis: Secondary | ICD-10-CM | POA: Diagnosis not present

## 2022-09-29 DIAGNOSIS — B079 Viral wart, unspecified: Secondary | ICD-10-CM | POA: Diagnosis not present

## 2022-09-29 DIAGNOSIS — L570 Actinic keratosis: Secondary | ICD-10-CM | POA: Diagnosis not present

## 2022-09-29 DIAGNOSIS — L814 Other melanin hyperpigmentation: Secondary | ICD-10-CM | POA: Diagnosis not present

## 2022-10-09 ENCOUNTER — Encounter: Payer: Self-pay | Admitting: Family Medicine

## 2022-10-09 ENCOUNTER — Telehealth (INDEPENDENT_AMBULATORY_CARE_PROVIDER_SITE_OTHER): Payer: PPO | Admitting: Family Medicine

## 2022-10-09 VITALS — BP 145/83 | HR 90 | Temp 97.8°F | Ht 70.5 in | Wt 246.6 lb

## 2022-10-09 DIAGNOSIS — D84821 Immunodeficiency due to drugs: Secondary | ICD-10-CM | POA: Diagnosis not present

## 2022-10-09 DIAGNOSIS — T380X5A Adverse effect of glucocorticoids and synthetic analogues, initial encounter: Secondary | ICD-10-CM

## 2022-10-09 DIAGNOSIS — Z7952 Long term (current) use of systemic steroids: Secondary | ICD-10-CM | POA: Diagnosis not present

## 2022-10-09 DIAGNOSIS — U071 COVID-19: Secondary | ICD-10-CM

## 2022-10-09 DIAGNOSIS — R918 Other nonspecific abnormal finding of lung field: Secondary | ICD-10-CM

## 2022-10-09 MED ORDER — HYDROCODONE BIT-HOMATROP MBR 5-1.5 MG/5ML PO SOLN: 5.0000 mL | Freq: Every evening | ORAL | 0 refills | Status: DC | PRN

## 2022-10-09 MED ORDER — MOLNUPIRAVIR EUA 200MG CAPSULE: 4.0000 | ORAL_CAPSULE | Freq: Two times a day (BID) | ORAL | 0 refills | Status: AC

## 2022-10-09 MED ORDER — BENZONATATE 200 MG PO CAPS: 200.0000 mg | ORAL_CAPSULE | Freq: Two times a day (BID) | ORAL | 0 refills | Status: DC | PRN

## 2022-10-09 NOTE — Progress Notes (Signed)
VIRTUAL VISIT A virtual visit is felt to be most appropriate for this patient at this time.   I connected with the patient on 10/09/22 at  2:20 PM EST by virtual telehealth platform and verified that I am speaking with the correct person using two identifiers.   I discussed the limitations, risks, security and privacy concerns of performing an evaluation and management service by  virtual telehealth platform and the availability of in person appointments. I also discussed with the patient that there may be a patient responsible charge related to this service. The patient expressed understanding and agreed to proceed.  Patient location: Home Provider Location: [place] Participants: Kurt Aguilar and [name]   Chief Complaint  Patient presents with   Covid Positive    Cough and sore throat X 4 days tested neg on Tuesday but +today    History of Present Illness:  77 y.o. male patient of Tower, [name], MD with history of hypertension, coronary artery disease, chronic diastolic heart failure, poorly controlled diabetes, chronic kidney disease who is steroid-dependent with history of BOOP presents with  COVID infection   Date of onset: 4 days Initial symptoms included ST, cough Symptoms progressed to chest congestion, worsened SOB, mild wheeze. No ear pain, no face pain. No fever, really fatigued, no body aches.   Sick contacts: Yes COVID testing:   Positive home COVID test today October 09, 2022   He has tried to treat with hydrocodone cough syrup.. helping sleep at night.  Benzonatate helps, mucinex BID. Using vit C and zinc.   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 [name]. Non-smoker.   On chronic prednisone.. has recently increased to 25 mg daily.  Blood sugar in AM 115, after meal 205  COVID 19 screen No recent travel or known exposure to  COVID19 The patient denies respiratory symptoms of COVID 19 at this time.  The importance of social distancing was discussed today.   Review of Systems  Constitutional:  Negative for chills and fever.  HENT:  Positive for congestion. Negative for ear pain.   Eyes:  Negative for pain and redness.  Respiratory:  Positive for cough and shortness of breath.   Cardiovascular:  Negative for chest pain, palpitations and leg swelling.  Gastrointestinal:  Negative for abdominal pain, blood in stool, constipation, diarrhea, nausea and vomiting.  Genitourinary:  Negative for dysuria.  Musculoskeletal:  Negative for falls and myalgias.  Skin:  Negative for rash.  Neurological:  Negative for dizziness.  Psychiatric/Behavioral:  Negative for depression. The patient is not nervous/anxious.       Past Medical History:  Diagnosis Date   Arthritis    Chronic kidney disease    Complication of anesthesia    constipation and inability to  urinate happened a few days after cataract surgery    Diabetes ([place])    type 2    Diverticulitis    GERD (gastroesophageal reflux disease)    Gout    Heart attack ([place])    Heart disease    Heart murmur    Hyperglycemia    Hyperlipidemia    Hypertension    Osteoporosis    Pneumonia    hx of BOOP followed by Dr [name]    Sleep apnea    cpapp - setting at 17     reports that he quit smoking about 17 years ago. His smoking  use included cigars and cigarettes. He has a 3.00 pack-year smoking history. He has never used smokeless tobacco. He reports current alcohol use of about 1.0 standard drink of alcohol per week. He reports that he does not use drugs.   Current Outpatient Medications:    allopurinol (ZYLOPRIM) 100 MG tablet, TAKE 1 TABLET BY MOUTH TWICE A DAY, Disp: 180 tablet, Rfl: 3   aspirin EC (ASPIRIN 81) 81 MG tablet, Take 81 mg by mouth daily. Swallow whole., Disp: , Rfl:    benzonatate (TESSALON) 200 MG capsule, Take 1 capsule (200 mg total) by mouth 2  (two) times daily as needed for cough., Disp: 20 capsule, Rfl: 0   Calcium Carbonate-Vit D-Min (CALCIUM-VITAMIN D-MINERALS) 600-800 MG-UNIT CHEW, Chew 1 tablet by mouth daily., Disp: , Rfl:    colchicine 0.6 MG tablet, Take 1 tablet (0.6 mg total) by mouth as needed (GOUT FLARE). TAKE 1 TABLET BY MOUTH TWICE A DAY WITH A MEAL AS NEEDED FOR ACUTE FLARE OF GOUT, Disp: , Rfl:    Continuous Blood Gluc Receiver (FREESTYLE LIBRE 2 READER) DEVI, USE AS INSTRUCTED TO CHECK BLOOD SUGARS., Disp: 1 each, Rfl: 0   Continuous Blood Gluc Sensor (FREESTYLE LIBRE 2 SENSOR) MISC, 1 Device by Does not apply route every 14 (fourteen) days., Disp: 6 each, Rfl: 3   ezetimibe (ZETIA) 10 MG tablet, TAKE 1 TABLET BY MOUTH EVERY DAY, Disp: 90 tablet, Rfl: 3   famotidine (PEPCID) 20 MG tablet, TAKE 1 TABLET BY MOUTH EVERYDAY AT BEDTIME (Patient taking differently: Take 25 mg by mouth at bedtime. TAKE 1 TABLET BY MOUTH EVERYDAY AT BEDTIME), Disp: 90 tablet, Rfl: 1   furosemide (LASIX) 20 MG tablet, TAKE 1 TABLET BY MOUTH EVERY DAY, Disp: 90 tablet, Rfl: 3   Glucosamine HCl (GLUCOSAMINE PO), Take 1,500 mg by mouth 2 (two) times daily. , Disp: , Rfl:    glucose blood (FREESTYLE LITE) test strip, 1 each by Other route 4 (four) times daily. E11.65, Disp: 400 strip, Rfl: 0   HYDROcodone bit-homatropine (HYCODAN) 5-1.5 MG/5ML syrup, Take 5 mLs by mouth at bedtime as needed for cough., Disp: 120 mL, Rfl: 0   insulin isophane & regular human KwikPen (NOVOLIN 70/30 KWIKPEN) (70-30) 100 UNIT/ML KwikPen, Inject 45 Units into the skin daily with breakfast. And pen needles 1/day, Disp: 30 mL, Rfl: 3   insulin lispro (HUMALOG KWIKPEN) 200 UNIT/ML KwikPen, INJECT 12-15 UNITS INTO THE SKIN 2 (TWO) TIMES DAILY BEFORE A MEAL., Disp: 30 mL, Rfl: 3   Insulin Pen Needle 32G X 4 MM MISC, Use 3x a day, Disp: 200 each, Rfl: 3   Lancets (FREESTYLE) lancets, 1 EACH BY OTHER ROUTE 4 (FOUR) TIMES DAILY. E11.65, Disp: 400 each, Rfl: 0   Lutein 20 MG CAPS,  Take 20 mg by mouth daily. , Disp: , Rfl:    metoprolol succinate (TOPROL-XL) 25 MG 24 hr tablet, Take 0.5 tablets (12.5 mg total) by mouth at bedtime., Disp: 45 tablet, Rfl: 1   molnupiravir EUA (LAGEVRIO) 200 mg CAPS capsule, Take 4 capsules (800 mg total) by mouth 2 (two) times daily for 5 days., Disp: 40 capsule, Rfl: 0   nitroGLYCERIN (NITROSTAT) 0.4 MG SL tablet, Place 1 tablet (0.4 mg total) under the tongue every 5 (five) minutes x 3 doses as needed for chest pain., Disp: 30 tablet, Rfl: 12   omeprazole (PRILOSEC) 20 MG capsule, TAKE 2 CAPSULES (40 MG TOTAL) BY MOUTH DAILY BEFORE BREAKFAST. (Patient taking differently: Take 40 mg by mouth  daily before breakfast.), Disp: 180 capsule, Rfl: 3   polyethylene glycol (MIRALAX / GLYCOLAX) 17 g packet, Take 17 g by mouth daily., Disp: , Rfl:    predniSONE (DELTASONE) 10 MG tablet, Take 1 tablet (10 mg total) by mouth daily with breakfast. Take prednisone 30 mg (3 tabs) for 3 days, then continue your maintenance dose of prednisone, Disp: , Rfl: 0   rosuvastatin (CRESTOR) 40 MG tablet, Take 1 tablet (40 mg total) by mouth daily., Disp: 90 tablet, Rfl: 2   Semaglutide, 2 MG/DOSE, (OZEMPIC, 2 MG/DOSE,) 8 MG/3ML SOPN, Inject 2 mg into the skin once a week., Disp: 9 mL, Rfl: 3   Observations/Objective: Blood pressure (!) 145/83, pulse 90, temperature 97.8 F (36.6 C), height 5' 10.5" (1.791 m), weight 246 lb 9 oz (111.8 kg).  Physical Exam Constitutional:      General: The patient is not in acute distress. Pulmonary:     Effort: Pulmonary effort is normal. No respiratory distress.  Neurological:     Mental Status: The patient is alert and oriented to person, place, and time.  Psychiatric:        Mood and Affect: Mood normal.        Behavior: Behavior normal.    Assessment and Plan COVID-19 Assessment & Plan: COVID19  Infection < 5 days from onset of symptoms in 6 x  vaccinated overweight individual with history of BOOP, CHF, HTN, CAD, DM and  DM who is immunocompromised on prednisone chronically.  No clear sign of bacterial infection at this time.   Some increase in SOB... He has already increased his dose of prednisone up from 10 to 25 mg daily.  This is reasonable  No red flags/need for ER visit or in-person exam at respiratory clinic at this time..    Pt higher risk for COVID complications given immunocompromisation and multiple complicating past medical history issues including bronchiolitis obliterans. No medication interactions with Paxlovid but GFR 22 . We discussed consideration of renal dose of Paxlovid versus molnupiravir.  He is most comfortable with molnupiravir as he took this in the past.   Start molnupiravir 5 day course. Reviewed course of medication and side effect profile with patient in detail.   Symptomatic care with mucinex and cough suppressant at night.  I sent in prescriptions to refill his cough suppressant syrup as well as benzonatate. If SOB begins symptoms worsening.. have low threshold for in-person exam, if severe shortness of breath ER visit recommended.  Can monitor Oxygen saturation at home with home monitor if able to obtain.  Go to ER if O2 sat < 90% on room air.   Reviewed home care and provided information through [facility].  Recommended quarantine 5 days isolation recommended. Return to work day 6 and wear mask for 4 more days to complete 10 days. Provided info about prevention of spread of COVID 19.    Pulmonary infiltrates with high  ESR c/w BOOP/ idiopathic   Immunocompromised due to corticosteroids (HCC)  Other orders -     HYDROcodone Bit-Homatrop MBr; Take 5 mLs by mouth at bedtime as needed for cough.  Dispense: 120 mL; Refill: 0 -     Benzonatate; Take 1 capsule (200 mg total) by mouth 2 (two) times daily as needed for cough.  Dispense: 20 capsule; Refill: 0 -     molnupiravir EUA; Take 4 capsules (800 mg total) by mouth 2 (two) times daily for 5 days.  Dispense: 40 capsule; Refill:  0  I discussed the assessment and treatment plan with the patient. The patient was provided an opportunity to ask questions and all were answered. The patient agreed with the plan and demonstrated an understanding of the instructions.   The patient was advised to call back or seek an in-person evaluation if the symptoms worsen or if the condition fails to improve as anticipated.     Kurt Lofts, MD

## 2022-10-09 NOTE — Assessment & Plan Note (Signed)
COVID19  Infection < 5 days from onset of symptoms in 6 x  vaccinated overweight individual with history of BOOP, CHF, HTN, CAD, DM and DM who is immunocompromised on prednisone chronically.  No clear sign of bacterial infection at this time.   Some increase in SOB... He has already increased his dose of prednisone up from 10 to 25 mg daily.  This is reasonable  No red flags/need for ER visit or in-person exam at respiratory clinic at this time..    Pt higher risk for COVID complications given immunocompromisation and multiple complicating past medical history issues including bronchiolitis obliterans. No medication interactions with Paxlovid but GFR 22 . We discussed consideration of renal dose of Paxlovid versus molnupiravir.  He is most comfortable with molnupiravir as he took this in the past.   Start molnupiravir 5 day course. Reviewed course of medication and side effect profile with patient in detail.   Symptomatic care with mucinex and cough suppressant at night.  I sent in prescriptions to refill his cough suppressant syrup as well as benzonatate. If SOB begins symptoms worsening.. have low threshold for in-person exam, if severe shortness of breath ER visit recommended.  Can monitor Oxygen saturation at home with home monitor if able to obtain.  Go to ER if O2 sat < 90% on room air.   Reviewed home care and provided information through [facility].  Recommended quarantine 5 days isolation recommended. Return to work day 6 and wear mask for 4 more days to complete 10 days. Provided info about prevention of spread of COVID 19.

## 2022-10-10 ENCOUNTER — Other Ambulatory Visit: Payer: Self-pay | Admitting: Family Medicine

## 2022-10-10 ENCOUNTER — Other Ambulatory Visit: Payer: Self-pay | Admitting: Internal Medicine

## 2022-10-14 ENCOUNTER — Telehealth: Payer: Self-pay | Admitting: Family Medicine

## 2022-10-14 MED ORDER — HYDROCODONE BIT-HOMATROP MBR 5-1.5 MG/5ML PO SOLN: 5.0000 mL | Freq: Every evening | ORAL | 0 refills | Status: DC | PRN

## 2022-10-14 NOTE — Telephone Encounter (Signed)
**  prev note was wrong.**  Pt said he did a virtual visit with Dr. Diona Browner for covid. Pt called in because the cough med Dr. Diona Browner prescribed (hycodan) is on back order for at least 6 weeks and he couldn't get it filled so he wont be able to get our Rx filled either. Pt was asking if PCP could send in an alt med that's just as strong as the hycodan in to his pharmacy to help with the coughing. I did advise pt most Rx strength cough meds are on back order but will still send message back to PCP  Pt said he is still coughing and congested, he is a little SOB when walking but not much, he has some wheezing. No fever not chest tightness and right now he is stable, he said he did have a ST but that's better.

## 2022-10-14 NOTE — Telephone Encounter (Signed)
Pt notified of Dr. Marliss Coots comments. Pt already has tessalon pills and not helping much. He will check with pharmacy to see if there is anything in stock

## 2022-10-14 NOTE — Telephone Encounter (Signed)
Patient would like to know if he can receive another refill for medication HYDROcodone bit-homatropine (HYCODAN) 5-1.5 MG/5ML syrup ? He stated that he still having some side effects from covid,such as coughing.

## 2022-10-14 NOTE — Telephone Encounter (Signed)
There is not much available right now Have him call and ask pharmacy what they do have and let me know-if appropriate I can send We can sometimes get tessalon but that does not tend to be strong

## 2022-10-14 NOTE — Telephone Encounter (Signed)
I sent it  In light of his lung problems he may need to be seen in the office here or at pulmonary   How is his breathing?  Wheezing/tightness? Fever?

## 2022-10-28 ENCOUNTER — Other Ambulatory Visit: Payer: Self-pay | Admitting: Internal Medicine

## 2022-10-28 ENCOUNTER — Other Ambulatory Visit: Payer: Self-pay | Admitting: Family Medicine

## 2022-10-30 ENCOUNTER — Telehealth: Payer: Self-pay | Admitting: Cardiovascular Disease

## 2022-10-30 DIAGNOSIS — R000 Tachycardia, unspecified: Secondary | ICD-10-CM | POA: Diagnosis not present

## 2022-10-30 DIAGNOSIS — R42 Dizziness and giddiness: Secondary | ICD-10-CM | POA: Diagnosis not present

## 2022-10-30 DIAGNOSIS — N184 Chronic kidney disease, stage 4 (severe): Secondary | ICD-10-CM | POA: Diagnosis not present

## 2022-10-30 NOTE — Telephone Encounter (Signed)
Spoke with Lexine Baton NP with [facility]. She had done a tele visit with patient and reports the elevated heart rates were her concern. She also states pt had episode of chest pain which resolved with Nitro. Advised that I would call patient and then send message over to provider for his review and recommendations.

## 2022-10-30 NOTE — Telephone Encounter (Signed)
Called and spoke with patient in regards to symptoms and concerns reported by Nurse Practitioner during his tele visit. He reports having heart rates from 100-120's with shortness of breath and not being able to walk as far as he had in the past. Patient states he had episode of chest pain last week and it resolved with 1 nitro pill. He did have COVID one month ago and has not felt that good. Advised that I would send this information to Dr. [name] for review and further recommendations. Strict ED precautions reviewed and he verbalized understanding with no further questions.

## 2022-10-30 NOTE — Telephone Encounter (Signed)
STAT if HR is under 50 or over 120 (normal HR is 60-100 beats per minute)  What is your heart rate?   105 and went up to 113  Do you have a log of your heart rate readings (document readings)?   No  Do you have any other symptoms?   [name] stated patient had chest pains last week which resolved with one nitroglycerin.  Patient gets intermittent chest tightness and sometimes gets lightheaded.   [name] noted patient had COVID about 3 weeks ago.  [name] reported patient's BP and HR readings: BP 148/96 (Sitting)  HR 105 BP 142/88 (Standing)  HR 113 [name] stated patient can be called directly.

## 2022-10-31 ENCOUNTER — Other Ambulatory Visit: Payer: Self-pay

## 2022-10-31 DIAGNOSIS — E1122 Type 2 diabetes mellitus with diabetic chronic kidney disease: Secondary | ICD-10-CM

## 2022-10-31 MED ORDER — FREESTYLE LIBRE 2 SENSOR MISC: 1.0000 | 3 refills | Status: DC

## 2022-11-03 NOTE — Telephone Encounter (Signed)
LVM to schedule appt

## 2022-11-10 ENCOUNTER — Other Ambulatory Visit: Payer: Self-pay

## 2022-11-12 ENCOUNTER — Other Ambulatory Visit: Payer: Self-pay

## 2022-11-13 ENCOUNTER — Other Ambulatory Visit: Payer: Self-pay

## 2022-11-17 ENCOUNTER — Other Ambulatory Visit: Payer: Self-pay | Admitting: Internal Medicine

## 2022-11-17 ENCOUNTER — Other Ambulatory Visit: Payer: Self-pay | Admitting: Family Medicine

## 2022-11-17 ENCOUNTER — Telehealth: Payer: Self-pay

## 2022-11-17 DIAGNOSIS — R918 Other nonspecific abnormal finding of lung field: Secondary | ICD-10-CM

## 2022-11-17 NOTE — Telephone Encounter (Signed)
Pt had to increase his prednisone dose and blood sugars have been over 200. Pt wants to know how much he should increase his insulin.

## 2022-11-17 NOTE — Telephone Encounter (Signed)
Pt contacted and advised per provider: per my records, he is on nph 45 units in am. He can go to 55 units for the first 1-2 days and and adjust from there. If he is also taking Novolog 12-15 units as discussed at last ov, may also need a higher dose if sugars remain high after increasing nph (maybe 16-18). Let us know how it goes.

## 2022-11-19 DIAGNOSIS — H04123 Dry eye syndrome of bilateral lacrimal glands: Secondary | ICD-10-CM | POA: Diagnosis not present

## 2022-11-19 DIAGNOSIS — E119 Type 2 diabetes mellitus without complications: Secondary | ICD-10-CM | POA: Diagnosis not present

## 2022-11-19 DIAGNOSIS — H26493 Other secondary cataract, bilateral: Secondary | ICD-10-CM | POA: Diagnosis not present

## 2022-11-19 DIAGNOSIS — H353131 Nonexudative age-related macular degeneration, bilateral, early dry stage: Secondary | ICD-10-CM | POA: Diagnosis not present

## 2022-11-19 LAB — HM DIABETES EYE EXAM

## 2022-11-27 DIAGNOSIS — N189 Chronic kidney disease, unspecified: Secondary | ICD-10-CM | POA: Diagnosis not present

## 2022-11-27 DIAGNOSIS — Q612 Polycystic kidney, adult type: Secondary | ICD-10-CM | POA: Diagnosis not present

## 2022-11-27 DIAGNOSIS — E1122 Type 2 diabetes mellitus with diabetic chronic kidney disease: Secondary | ICD-10-CM | POA: Diagnosis not present

## 2022-11-27 DIAGNOSIS — N184 Chronic kidney disease, stage 4 (severe): Secondary | ICD-10-CM | POA: Diagnosis not present

## 2022-11-27 DIAGNOSIS — I10 Essential (primary) hypertension: Secondary | ICD-10-CM | POA: Diagnosis not present

## 2022-11-27 NOTE — Progress Notes (Unsigned)
Subjective:   Patient ID: Kurt Aguilar, male    DOB: [birth year]  MRN: [id number]    Brief patient profile:  88 yowm  never cigarette smoker (just some cigars)  after cabg in [place] started noting recurrent winter cough regardless of where located in [place] ([place]/ [place]) with typical acute episode in [place] in Feb 2017  This  cough  more dry less severe than previous >  abx and pred > improved but did not resolve and worse when stopped it so restarted pred/abx > dx as pneumonia with variable as dz > referred to pulmonary clinic 12/19/2015 by [name] with 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   History of Present Illness  12/19/2015 1st [facility] office visit/ Kurt Aguilar   Chief Complaint  Patient presents with   Pulmonary Consult    Referred by [name]. Pt c/o cough x 4 wks- non prod and worse in the evening and when he lies down. Talking and exertion are things that trigger the cough. He has been on pred x 2 and both times cough resolved and immediately returns once done with med. He also c/o SOB- gets winded just walking from room to room at home.   while on prednisone still some weak and sob but better cough and last dose at least one week and worse overall since off it assoc with sob room to room and weakness/ excess/ purulent sputum or mucus plugs / no unusual exp / no ctd.  Has new CPAP machine fall 2016  rec  Omeprazole 20 mg Take 30- 60 min before your first and last meals of the day  Prednisone 10  X 2 each day until 100% better then 10 mg daily  Dx is Brochilitis obliterans with organizing pneumonia vs eosiniphic pneumonia most likely diagnosis GERD  Diet   Symptoms onset Dec 4th 2022  [place] 08/26/2021  acute visit  Prometh codeine    cough syrup Benzonatate perles- for cough Molnupiravir rx  and zpak  >>>  walking room to room at home/ cough never improved and pred rx up to 40 mg   Admit date:     09/17/2021   Discharge date: 09/24/21  Discharge  Physician: Kurt Aguilar     Discharge Diagnoses     Acute coronary syndrome / NSTEMI(HCC) Acute kidney injury on CKD stage IIIb Lactic acidosis Shortness of breath with coughing   Essential hypertension   Right bundle branch block   CAD (coronary artery disease)   DM2 (diabetes mellitus, type 2) (HCC)   Steroid dependent (HCC)   AKI (acute kidney injury) ([place])   Stage 3b chronic kidney disease (CKD) [facility])         Hospital Course     Patient is a 77 year old male with history of CAD status post CABG in 1997, DM2, HTN, HLD, Boop, recent COVID-19 infection in December 2022, chronic steroid dependency presented to ED with mild intermittent chest pain for a month, worse with exertion, dyspnea.  Patient was found to have elevated troponins and was admitted for further work-up    NSTEMI [facility]) with known history of CAD status post CABG x5 in 1997 -2D echo showed no wall motion abnormality, normal EF, likely demand ischemia -Left heart cardiac cath showed multivessel native coronary artery diseasesaphenous vein to right coronary artery with 70% ostial to proximal stenosis, SVG to OM 1 and 2 patent, SVG to first diagonal patent, LIMA to LAD patent.  -Continue aspirin, beta-blocker, Crestor, Zetia,  Essential hypertension -BP stable, continue beta-blocker    Hyperkalemia -Resolved   Acute on chronic CKD stage IIIb, lactic acidosis -Creatinine 2.34 on admission, baseline around 1.8-1.9 -Creatinine improved, close to baseline.  Completed IV fluid hydration.      Shortness of breath with cough -Per patient 4 weeks, dry persistent cough, possibly due to COVID-19 in December (positive on 12/4), underlying history of BOOP -CXR with no pneumonia or effusion.  VQ scan negative for PE.  Normal BNP -Continues to feel winded with exertion, coughing spasms. -Continue prednisone 40 mg daily with a taper upon discharge. (Outpatient on 10 mg BID) -Continue scheduled nebs, incentive  spirometry, Tessalon Perles  -Home O2 evaluation done on 1/15, did not meet criteria for home O2   Hyperlipidemia -LDL 74 in 04/2021.  Continue Crestor 40 mg daily, Zetia 10 mg daily   Diabetes mellitus type 2, on long-term insulin with renal complications -Hemoglobin A1c 9.5 on 09/13/2021 --Continue insulin regimen, hopefully CBGs will continue to improve once prednisone taper is completed and back to maintenance dose   BOOP - follows Dr [name] outpatient, continue prednisone     Obesity Estimated body mass index is 32.39 kg/m as calculated from the following:   Height as of this encounter: 5\' 10"  (1.778 m).   Weight as of this encounter: 102.4 kg.     05/29/2022  f/u ov/[facility]/ [facility] re: BOOP  maint on prednisone 10 mg  with worse cough at 5 mg  Chief Complaint  Patient presents with   Follow-up    More sob with less activity and more fatigued over the past few weeks.  Sleeps a lot, wears a CPAP.  Dyspnea:  no exercise / walking 25-50 ft max with fatigue limiting and presyncope but less sob and no cp Cough: better at 10 mg daily  Sleeping: cpap / 30 degrees up  SABA use: none 02: none  Light headed standing x months rx by Cards Rec No change rx     08/29/2022  f/u ov/Kurt Aguilar re: BOOP    maint on 10 mg pred / day   Chief Complaint  Patient presents with   Follow-up    SOB and heaviness in chest x 1 week.  In hospital for dehydration.  MD in [facility] did increase prednisone to 40 mg x 3 days, then back to 10 mg a day.   Not able to get a deep breath.  Dyspnea:  worse on 10 mg vs 40 doing some bicycle but 10 min limit s desats Cough: mostly with talking/ dry quality daytime  Sleeping: cpap / 30 degrees  SABA use: none  02: none  Classic AP rx medically per cards / no change in pattern / intensity. Rec Ceiling on prednisone is 20 mg per day taken at breakfast - floor is 10 mg daily   [place]  admit     Admit date:     08/17/2022  Discharge date: 08/19/2022   Discharge Physician: Jennye Boroughs    PCP: [name], MD    Recommendations at discharge:     Follow up with PCP in 1 week   Discharge Diagnoses: Principal Problem:   Severe sepsis ([place]) Active Problems:   Acute gastroenteritis   Acute renal failure superimposed on stage 3b chronic kidney disease (HCC)   Chronic diastolic heart failure (HCC)   Pulmonary infiltrates with high  ESR c/w BOOP/ idiopathic    Steroid dependent (HCC)   Cystic kidney disease   Obstructive sleep apnea  Essential hypertension   Coronary artery disease of bypass graft of native heart with stable angina pectoris (HCC)   Right bundle branch block   Resolved Problems:   * No resolved hospital problems. *   Hospital Course:   Kurt Aguilar is a 77 y.o. male  with medical history significant for CAD status post CABGx5 in 1997, IDDM 2, HTN, HLD, BOOP with chronic shortness of breath and on chronic steroids, CKD 3B, who presents to the ED with a 4-day history of nausea vomiting and diarrhea associated with poor oral intake and generalized weakness.      Lactic acid was 3 and WBC was 14.8.  He was admitted to the hospital for severe sepsis secondary to gastroenteritis.  He was treated with IV fluids.  His condition has improved and is deemed stable for discharge to home today.  Discharge plan was discussed with the patient and his wife at the bedside.       Assessment and Plan:   Severe sepsis secondary to gastroenteritis: Resolved.  He was unable to provide stool for testing because diarrhea had resolved.     AKI on CKD stage IIIb, polycystic kidney disease: Improved.  Creatinine close to baseline.   Chronic diastolic CHF: Compensated.  Continue metoprolol.  Resume Lasix at discharge   Elevated troponin (likely due to demand ischemia.   BOOP on chronic prednisone   Other comorbidities include CAD, hypertension, OSA on CPAP   10/09/22   11/28/2022  f/u ov/Kurt Aguilar/ [facility] re: BOOP     maint on pred 20 mg x 2  on way down  Chief Complaint  Patient presents with   Follow-up    SOB with exertion. Dry cough. Wheezing.   Dyspnea:  50 ft and stop but some days goes to [place]  Cough: non productive  Sleeping: cpap/ no 02 bed blocks plus pillows  SABA use: not using  02: not using     No obvious day to day or daytime variability or assoc excess/ purulent sputum or mucus plugs or hemoptysis or cp or chest tightness, subjective wheeze or overt sinus or hb symptoms.   *** without nocturnal  or early am exacerbation  of respiratory  c/o's or need for noct saba. Also denies any obvious fluctuation of symptoms with weather or environmental changes or other aggravating or alleviating factors except as outlined above   No unusual exposure hx or h/o childhood pna/ asthma or knowledge of premature birth.  Current Allergies, Complete Past Medical History, Past Surgical History, Family History, and Social History were reviewed in [employer] record.  ROS  The following are not active complaints unless bolded Hoarseness, sore throat, dysphagia, dental problems, itching, sneezing,  nasal congestion or discharge of excess mucus or purulent secretions, ear ache,   fever, chills, sweats, unintended wt loss or wt gain, classically pleuritic or exertional cp,  orthopnea pnd or arm/hand swelling  or leg swelling, presyncope, palpitations, abdominal pain, anorexia, nausea, vomiting, diarrhea  or change in bowel habits or change in bladder habits, change in stools or change in urine, dysuria, hematuria,  rash, arthralgias, visual complaints, headache, numbness, weakness or ataxia or problems with walking or coordination,  change in mood or  memory.        Current Meds  Medication Sig   allopurinol (ZYLOPRIM) 100 MG tablet TAKE 1 TABLET BY MOUTH TWICE A DAY   aspirin EC (ASPIRIN 81) 81 MG tablet Take 81 mg by mouth daily.  Swallow whole.   benzonatate (TESSALON) 200 MG  capsule Take 1 capsule (200 mg total) by mouth 2 (two) times daily as needed for cough.   Calcium Carbonate-Vit D-Min (CALCIUM-VITAMIN D-MINERALS) 600-800 MG-UNIT CHEW Chew 1 tablet by mouth daily.   colchicine 0.6 MG tablet Take 1 tablet (0.6 mg total) by mouth as needed (GOUT FLARE). TAKE 1 TABLET BY MOUTH TWICE A DAY WITH A MEAL AS NEEDED FOR ACUTE FLARE OF GOUT   Continuous Blood Gluc Receiver (FREESTYLE LIBRE 2 READER) DEVI USE AS INSTRUCTED TO CHECK BLOOD SUGARS.   Continuous Blood Gluc Sensor (FREESTYLE LIBRE 2 SENSOR) MISC 1 Device by Does not apply route every 14 (fourteen) days.   ezetimibe (ZETIA) 10 MG tablet TAKE 1 TABLET BY MOUTH EVERY DAY   famotidine (PEPCID) 20 MG tablet TAKE 1 TABLET BY MOUTH EVERYDAY AT BEDTIME   furosemide (LASIX) 20 MG tablet TAKE 1 TABLET BY MOUTH EVERY DAY   Glucosamine HCl (GLUCOSAMINE PO) Take 1,500 mg by mouth 2 (two) times daily.    glucose blood (FREESTYLE LITE) test strip 1 each by Other route 4 (four) times daily. E11.65   HYDROcodone bit-homatropine (HYCODAN) 5-1.5 MG/5ML syrup Take 5 mLs by mouth at bedtime as needed for cough.   insulin isophane & regular human KwikPen (NOVOLIN 70/30 KWIKPEN) (70-30) 100 UNIT/ML KwikPen Inject 45 Units into the skin daily with breakfast. And pen needles 1/day   insulin lispro (HUMALOG KWIKPEN) 200 UNIT/ML KwikPen INJECT 12-15 UNITS INTO THE SKIN 2 (TWO) TIMES DAILY BEFORE A MEAL.   Insulin Pen Needle 32G X 4 MM MISC Use 3x a day   ketoconazole (NIZORAL) 2 % cream Apply 1 Application topically 2 (two) times daily.   Lancets (FREESTYLE) lancets 1 EACH BY OTHER ROUTE 4 (FOUR) TIMES DAILY. E11.65   Lutein 20 MG CAPS Take 20 mg by mouth daily.    meclizine (ANTIVERT) 12.5 MG tablet TAKE 1-2 TABLETS BY MOUTH THREE TIMES A DAY AS NEEDED FOR DIZZINESS   metoprolol succinate (TOPROL-XL) 25 MG 24 hr tablet Take 0.5 tablets (12.5 mg total) by mouth at bedtime.   nitroGLYCERIN (NITROSTAT) 0.4 MG SL tablet Place 1 tablet (0.4  mg total) under the tongue every 5 (five) minutes x 3 doses as needed for chest pain.   omeprazole (PRILOSEC) 40 MG capsule TAKE 1 CAPUSLE BY MOUTH 30- 60 MIN BEFORE YOUR FIRST AND LAST MEALS OF THE DAY   polyethylene glycol (MIRALAX / GLYCOLAX) 17 g packet Take 17 g by mouth daily.   predniSONE (DELTASONE) 10 MG tablet TAKE 2 TABLETS BY MOUTH UNTIL BETTER THEN TAPER TO 1/2 TABLET   rosuvastatin (CRESTOR) 40 MG tablet TAKE 1 TABLET BY MOUTH EVERY DAY   Semaglutide, 2 MG/DOSE, (OZEMPIC, 2 MG/DOSE,) 8 MG/3ML SOPN Inject 2 mg into the skin once a week.                   Objective:   Physical Exam   Wts  08/29/2022     244  05/29/2022       239 02/14/2022         238  11/15/2021        236  10/04/2021        230  05/22/2021        230   01/15/2021       231  09/26/2020        220  11/24/2019        206  09/20/2018  222  08/26/2018      222 07/29/2017      232  04/15/2017          243  02/10/2017          250  12/19/2016        250 11/11/2016          258  09/11/2016          252  07/29/2016      244  05/16/2016          238  02/01/2016        235  01/07/2016          228   12/19/15 229 lb 4 oz (103.987 kg)  12/19/15 229 lb 4 oz (103.987 kg)  12/19/15 227 lb (102.967 kg)     Vital signs reviewed  11/28/2022  - Note at rest 02 sats  ***% on ***   General appearance:   amb wm Cushingnoid / few crackles and cough on insp         Assessment & Plan:

## 2022-11-28 ENCOUNTER — Encounter: Payer: Self-pay | Admitting: Internal Medicine

## 2022-11-28 ENCOUNTER — Ambulatory Visit (INDEPENDENT_AMBULATORY_CARE_PROVIDER_SITE_OTHER): Payer: PPO | Admitting: Internal Medicine

## 2022-11-28 VITALS — BP 122/80 | HR 112 | Temp 98.0°F | Ht 70.5 in | Wt 243.0 lb

## 2022-11-28 DIAGNOSIS — R918 Other nonspecific abnormal finding of lung field: Secondary | ICD-10-CM | POA: Diagnosis not present

## 2022-11-28 DIAGNOSIS — R058 Other specified cough: Secondary | ICD-10-CM

## 2022-11-28 MED ORDER — GABAPENTIN 100 MG PO CAPS: 100.0000 mg | ORAL_CAPSULE | Freq: Four times a day (QID) | ORAL | 2 refills | Status: DC

## 2022-11-28 MED ORDER — PREDNISONE 10 MG PO TABS: ORAL_TABLET | ORAL | 0 refills | Status: DC

## 2022-11-28 NOTE — Patient Instructions (Signed)
Prednisone 10 mg ceiling of 40 mg and a floor of 5 mg daily   Gabapentin 100mg  three times daily and add 1 pill every days until you fine the right dose for cough but don't exceed 300 mg 4 x daily   Please schedule a follow up visit in 3 months but call sooner if needed

## 2022-11-29 ENCOUNTER — Encounter: Payer: Self-pay | Admitting: Internal Medicine

## 2022-11-29 NOTE — Assessment & Plan Note (Signed)
Recurrent pattern since feb 2017 Trial of gabapentin 100 tid 07/29/2016 >  Still tessalon dep 09/11/2016 so increased to 300 tid and ppi bid ac > not improved 11/11/2016 > rec max gerd rx and sugarless candy > resolved 12/19/2016  And off gabapentin 04/2017  - recurred early dec 2019 while off gabapentin and low dose ppi only  - 09/20/2018 rec max gerd rx and 1st gen H1 blockers per guidelines  And if not better start back on gabapentin  - 11/24/2019 recurred around November 07 2019 on return from car trip to [place] > rx pred/zpak/max gerd rx  > much better prior to 11/27/20 when caught wife's "cold"  - worse again p covid 19  08/2021 - Allergy screen  02/14/22 >  Eos 0.1 /  IgE  7  - resumed gabapentin at 100 mg tid with max of 300 mg qid 11/29/2022 >>>   Cough with deep insp typical of BOOP/ ild  rather than UACS and does improve with higher dose steroids but given risk of such high doses reasonable to rechallenge with gabapentin as above  Discussed in detail all the  indications, usual  risks and alternatives  relative to the benefits with patient who agrees to proceed with Rx as outlined.      F/u  3 months, sooner prn         Each maintenance medication was reviewed in detail including emphasizing most importantly the difference between maintenance and prns and under what circumstances the prns are to be triggered using an action plan format where appropriate.  Total time for H and P, chart review, counseling,  and generating customized AVS unique to this office visit / same day charting = 20 min

## 2022-11-30 ENCOUNTER — Other Ambulatory Visit: Payer: Self-pay

## 2022-11-30 DIAGNOSIS — E1159 Type 2 diabetes mellitus with other circulatory complications: Secondary | ICD-10-CM

## 2022-11-30 MED ORDER — NOVOLIN 70/30 FLEXPEN (70-30) 100 UNIT/ML ~~LOC~~ SUPN: 45.0000 [IU] | PEN_INJECTOR | Freq: Every day | SUBCUTANEOUS | 3 refills | Status: DC

## 2022-12-02 DIAGNOSIS — M5451 Vertebrogenic low back pain: Secondary | ICD-10-CM | POA: Diagnosis not present

## 2022-12-02 DIAGNOSIS — M545 Low back pain, unspecified: Secondary | ICD-10-CM | POA: Diagnosis not present

## 2022-12-04 DIAGNOSIS — M791 Myalgia, unspecified site: Secondary | ICD-10-CM | POA: Diagnosis not present

## 2022-12-04 DIAGNOSIS — M5416 Radiculopathy, lumbar region: Secondary | ICD-10-CM | POA: Diagnosis not present

## 2022-12-06 ENCOUNTER — Other Ambulatory Visit: Payer: Self-pay | Admitting: Internal Medicine

## 2022-12-06 ENCOUNTER — Other Ambulatory Visit: Payer: Self-pay | Admitting: Cardiovascular Disease

## 2022-12-06 DIAGNOSIS — R918 Other nonspecific abnormal finding of lung field: Secondary | ICD-10-CM

## 2022-12-12 ENCOUNTER — Telehealth: Payer: Self-pay | Admitting: Internal Medicine

## 2022-12-12 DIAGNOSIS — R918 Other nonspecific abnormal finding of lung field: Secondary | ICD-10-CM

## 2022-12-12 MED ORDER — GABAPENTIN 300 MG PO CAPS: 300.0000 mg | ORAL_CAPSULE | Freq: Three times a day (TID) | ORAL | 2 refills | Status: DC

## 2022-12-12 MED ORDER — PREDNISONE 10 MG PO TABS: ORAL_TABLET | ORAL | 1 refills | Status: DC

## 2022-12-12 NOTE — Telephone Encounter (Signed)
Pt called the office to let Dr. Sherene Sires know about the gabapentin prescription. States that he is on gabapentin 300mg  taking it three times a day. Pt said he is also needing a refill of his prednisone. Also needs to have a refill of his omeprazole 40mg . Meds have been refilled for pt. Nothing further needed.

## 2022-12-16 ENCOUNTER — Other Ambulatory Visit: Payer: Self-pay

## 2022-12-17 DIAGNOSIS — G4733 Obstructive sleep apnea (adult) (pediatric): Secondary | ICD-10-CM | POA: Diagnosis not present

## 2022-12-19 ENCOUNTER — Telehealth: Payer: Self-pay | Admitting: Family Medicine

## 2022-12-19 NOTE — Telephone Encounter (Signed)
Please schedule appt

## 2022-12-19 NOTE — Telephone Encounter (Signed)
Patient wife called in and would like to setup an appointment with Mardella Layman to discuss something's. Thank you!

## 2022-12-19 NOTE — Progress Notes (Signed)
Care Management & Coordination Services Pharmacy Team  Reason for Encounter: Patient requests appointment with PharmD   Spoke with patient on 12/19/2022   Patient contacted to make appointment with PharmD. He is requiring some assistance with adherence to medications. Patient prefers to visit with PharmD in person. Appointment made.    Al Corpus, PharmD notified  Burt Knack, [facility] Clinical Pharmacy Assistant [phone]

## 2022-12-22 ENCOUNTER — Other Ambulatory Visit: Payer: Self-pay

## 2022-12-22 DIAGNOSIS — H26491 Other secondary cataract, right eye: Secondary | ICD-10-CM | POA: Diagnosis not present

## 2022-12-22 DIAGNOSIS — H40051 Ocular hypertension, right eye: Secondary | ICD-10-CM | POA: Diagnosis not present

## 2022-12-22 DIAGNOSIS — H26493 Other secondary cataract, bilateral: Secondary | ICD-10-CM | POA: Diagnosis not present

## 2022-12-22 DIAGNOSIS — H353132 Nonexudative age-related macular degeneration, bilateral, intermediate dry stage: Secondary | ICD-10-CM | POA: Diagnosis not present

## 2022-12-22 DIAGNOSIS — H524 Presbyopia: Secondary | ICD-10-CM | POA: Diagnosis not present

## 2022-12-22 DIAGNOSIS — E119 Type 2 diabetes mellitus without complications: Secondary | ICD-10-CM | POA: Diagnosis not present

## 2022-12-23 ENCOUNTER — Other Ambulatory Visit: Payer: Self-pay | Admitting: Internal Medicine

## 2022-12-23 DIAGNOSIS — N1832 Chronic kidney disease, stage 3b: Secondary | ICD-10-CM

## 2022-12-24 MED ORDER — INSULIN PEN NEEDLE 32G X 4 MM MISC
0 refills | Status: DC
Start: 2022-12-24 — End: 2023-01-25

## 2022-12-26 ENCOUNTER — Telehealth: Payer: Self-pay

## 2022-12-26 NOTE — Progress Notes (Signed)
Care Management & Coordination Services Pharmacy Team  Reason for Encounter: Appointment Reminder  Contacted patient to confirm in office appointment with Al Corpus , PharmD on 12/31/22 at 10:00  Unsuccessful outreach. Left message with location reminder of [facility].  Have you seen any other providers since your last visit with PCP? Yes- nephrology,pulmonology   Chart review:  Recent office visits:  10/09/22-Amy Bedsole,MD(fam med)-Telemedicine-covid positive-Start molnupiravir 5 day course. Symptomatic care with mucinex and cough suppressant at night quarantine 5 days isolation  08/25/22-Marne Tower,MD(PCP)hospital f/u,labs(cr is up slightly to 2.62 from 2.42 at discharge)schedule f/u with pulmonology,endocrinology  Recent consult visits:  12/04/22-[facility]- no data 11/28/22-Michael Wert,MD(pulmo)-f/u no medication changes, f/u 3 months 11/27/22- Harmeet Singh,MD(neph)-f/u, UA,labs,no medication changes,f/u 4 months  09/05/22-Cristina Gherghe,MD(endo)-f/u DM2,discussed about moving Jardiance before breakfast, as he was taking it afterwards. Patient states empic unaffordable,discussed trying again ,adjust insulin (A1c 7.9) Hospital visits:  08/17/22 - 08/19/22  [facility]- severe sepsis    Star Rating Drugs:  Medication:  Last Fill: [employer] Novolin  12/02/22 66 Humalog  11/22/22 80 Ozempic  12/22/22 84 Rosuvastatin  09/25/22 90 Jardiance  10/10/22  90   Care Gaps: Annual wellness visit in last year? Yes  If Diabetic: Last eye exam / retinopathy screening:UTD Last diabetic foot exam:UTD   Al Corpus, PharmD notified  Burt Knack, [facility] Clinical Pharmacy Assistant [phone]

## 2022-12-31 ENCOUNTER — Ambulatory Visit: Payer: PPO | Admitting: Pharmacist

## 2022-12-31 NOTE — Progress Notes (Signed)
Care Management & Coordination Services Pharmacy Note  12/31/2022 Name:  Kurt Aguilar MRN:  [id number] DOB:  04/12/1946  Summary: F/U visit -Dizziness/falls: pt reports orthostatic symptoms have returned over the past month including a few falls; notable recent changes include addition of gabapentin 1 month ago for cough and back pain, which pt reports have improved significantly; he has not checked BP at home; he has not been using meclizine due to concern that it was causing weight gain as he was taking it daily -Discussed gabapentin could be causing symptoms; also discussed role of meclizine as PRN drug for dizziness, it is not known for causing weight gain; multiple factors cold be contributing to weight gain including higher dose of prednisone; he is working on reducing dose of prednisone  Recommendations/Changes made from today's visit: -Advised trial of gabapentin dose reduction: 100 mg BID with option to increase up to 200 mg BID for control of symptoms without falls -Advised to use meclizine PRN for dizziness/vertigo -Advised to monitor glucose closely while reducing prednisone (he has CGM); contact endocrine for insulin adjustments if needed  Follow up plan: -Pharmacist follow up televisit scheduled for 1 month -Endocrine appt 01/06/23; Cardiology appt 01/12/23    Subjective: Kurt Aguilar is an 77 y.o. year old male who is a primary patient of Tower, Audrie Gallus, MD.  The care coordination team was consulted for assistance with disease management and care coordination needs.    Engaged with patient face to face for follow up visit.  Recent office visits: 08/25/22 Dr Milinda Antis OV: hospital f/u (gastroenteritis/dehydration) - [place] Pepper was d/c'd per nephrology  04/16/22 Dr Milinda Antis OV: annual - BP trending low. Unsure if he can cut back on metoprolol - reach out to cardiology.   Recent consult visits: 11/28/22 Dr Sherene Sires [facility]): BOOP - re-challenge gabapentin 100 mg QID  11/27/22 Dr Thedore Mins  (Nephrology): f/u - pt has d/c'd lisinopril d/t dizziness.  09/05/22 Dr Elvera Lennox (Endocrine): A1c 7.9%; restart Ozempic 2 mg; if unable to find ozempic, start Novolog 12-15 units before lunch and dinner  08/29/22 Dr Sherene Sires (Pulm): BOOP  08/29/22 Dr Mariah Milling (Cardiology): CAD - imdur on hold d/t orthostasis  08/21/22 Dr Thedore Mins (Nephrology): f/u - d/c Jardiance  06/19/22 Dr young (Pulmonology): OSA - continue BiPAP.   05/29/22 Dr Sherene Sires (Pulmonology): BOOP, chronic cough. Maintained on prednisone 10 mg.   05/06/22 Dr Elvera Lennox (Endocrine): A1c 6.6. Move Jardiance to before breakfast.   04/21/22 Dr Thedore Mins (Nephrology): BP 95/66. Try compression stockings. Take furosemide only PRN for significant edema.  Hospital visits: 08/17/22 - 08/19/22 Admission [facility]): sepsis 2/2 gastroenteritis. AKI. Stop imdur, Ozempic.   Objective:  Lab Results  Component Value Date   CREATININE 2.62 (H) 08/25/2022   BUN 40 (H) 08/25/2022   GFR 22.63 (L) 08/25/2022   EGFR 35 (L) 10/17/2021   GFRNONAA 27 (L) 08/19/2022   GFRAA 39 (L) 05/17/2020   NA 140 08/25/2022   K 4.6 08/25/2022   CALCIUM 9.1 08/25/2022   CO2 21 08/25/2022   GLUCOSE 169 (H) 08/25/2022    Lab Results  Component Value Date/Time   HGBA1C 7.9 (A) 09/05/2022 01:26 PM   HGBA1C 6.6 (A) 05/06/2022 01:40 PM   HGBA1C 6.6 (H) 05/17/2020 02:44 PM   HGBA1C 6.3 03/24/2019 09:25 AM   FRUCTOSAMINE 218 01/05/2017 12:02 PM   GFR 22.63 (L) 08/25/2022 12:08 PM   GFR 26.12 (L) 04/09/2022 08:11 AM   MICROALBUR 3.2 (H) 04/16/2022 11:56 AM   MICROALBUR 1.1 04/01/2019 03:34 PM  Last diabetic Eye exam:  Lab Results  Component Value Date/Time   HMDIABEYEEXA No Retinopathy 11/19/2022 08:47 AM    Last diabetic Foot exam: No results found for: "HMDIABFOOTEX"   Lab Results  Component Value Date   CHOL 169 04/09/2022   HDL 51.80 04/09/2022   LDLCALC 33 02/14/2022   LDLDIRECT 78.0 04/09/2022   TRIG 325.0 (H) 04/09/2022   CHOLHDL 3 04/09/2022        Latest Ref Rng & Units 08/18/2022    2:33 AM 08/17/2022    5:55 PM 04/09/2022    8:11 AM  Hepatic Function  Total Protein 6.5 - 8.1 g/dL 5.4  7.2  6.8   Albumin 3.5 - 5.0 g/dL 3.2  4.1  4.5   AST 15 - 41 U/L 21  33  24   ALT 0 - 44 U/L 18  26  35   Alk Phosphatase 38 - 126 U/L 35  50  51   Total Bilirubin 0.3 - 1.2 mg/dL 1.1  1.3  1.0     Lab Results  Component Value Date/Time   TSH 2.25 04/09/2022 08:11 AM   TSH 3.86 04/09/2021 07:54 AM       Latest Ref Rng & Units 08/18/2022    2:33 AM 08/17/2022    5:34 PM 04/09/2022    8:11 AM  CBC  WBC 4.0 - 10.5 K/uL 9.5  14.8  12.1   Hemoglobin 13.0 - 17.0 g/dL 16.1  09.6  04.5   Hematocrit 39.0 - 52.0 % 40.5  51.9  42.9   Platelets 150 - 400 K/uL 185  223  250.0     Lab Results  Component Value Date/Time   VITAMINB12 452 12/12/2015 05:58 PM    Clinical ASCVD: Yes  The ASCVD Risk score (Arnett DK, et al., 2019) failed to calculate for the following reasons:   The patient has a prior MI or stroke diagnosis        04/17/2022    2:18 PM 04/16/2022   10:05 AM 04/10/2021   11:24 AM  Depression screen PHQ 2/9  Decreased Interest 0 0 0  Down, Depressed, Hopeless 0 0 0  PHQ - 2 Score 0 0 0  Altered sleeping 0  0  Tired, decreased energy 0  0  Change in appetite 0  0  Feeling bad or failure about yourself  0  0  Trouble concentrating 0  0  Moving slowly or fidgety/restless 0  0  Suicidal thoughts 0  0  PHQ-9 Score 0  0  Difficult doing work/chores Not difficult at all  Not difficult at all     Social History   Tobacco Use  Smoking Status Former   Packs/day: 1.00   Years: 3.00   Additional pack years: 0.00   Total pack years: 3.00   Types: Cigars, Cigarettes   Quit date: 11/25/2004   Years since quitting: 18.1  Smokeless Tobacco Never  Tobacco Comments   occas. cigar quit 2006   BP Readings from Last 3 Encounters:  11/28/22 122/80  10/09/22 (!) 145/83  09/05/22 (!) 138/98   Pulse Readings from Last 3 Encounters:   11/28/22 (!) 112  10/09/22 90  09/05/22 68   Wt Readings from Last 3 Encounters:  11/28/22 243 lb (110.2 kg)  10/09/22 246 lb 9 oz (111.8 kg)  09/05/22 246 lb 9.6 oz (111.9 kg)   BMI Readings from Last 3 Encounters:  11/28/22 34.37 kg/m  10/09/22 34.88 kg/m  09/05/22 34.88 kg/m  Allergies  Allergen Reactions   Oxycodone-Acetaminophen Other (See Comments)    Stops breathing; has tolerated Tylenol before   Oxycodone-Acetaminophen Anaphylaxis    Medications Reviewed Today     Reviewed by Kathyrn Sheriff, [facility] (Pharmacist) on 12/31/22 at 1054  Med List Status: <None>   Medication Order Taking? Sig Documenting Provider Last Dose Status Informant  allopurinol (ZYLOPRIM) 100 MG tablet [id number] Yes TAKE 1 TABLET BY MOUTH TWICE A DAY Tower, Marne A, MD Taking Active Spouse/Significant Other  aspirin EC (ASPIRIN 81) 81 MG tablet [id number] Yes Take 81 mg by mouth daily. Swallow whole. [provider] Taking Active Spouse/Significant Other  benzonatate (TESSALON) 200 MG capsule [id number] Yes Take 1 capsule (200 mg total) by mouth 2 (two) times daily as needed for cough. Excell Seltzer, MD Taking Active   Calcium Carbonate-Vit D-Min (CALCIUM-VITAMIN D-MINERALS) 600-800 MG-UNIT CHEW [id number] Yes Chew 1 tablet by mouth daily. [provider] Taking Active Spouse/Significant Other  colchicine 0.6 MG tablet [id number] Yes Take 1 tablet (0.6 mg total) by mouth as needed (GOUT FLARE). TAKE 1 TABLET BY MOUTH TWICE A DAY WITH A MEAL AS NEEDED FOR ACUTE FLARE OF GOUT Lurene Shadow, MD Taking Active   Continuous Blood Gluc Receiver (FREESTYLE LIBRE 2 READER) DEVI [id number] Yes USE AS INSTRUCTED TO CHECK BLOOD SUGARS. Romero Belling, MD Taking Active Spouse/Significant Other  Continuous Blood Gluc Sensor (FREESTYLE LIBRE 2 SENSOR) [place] [id number] Yes 1 Device by Does not apply route every 14 (fourteen) days. Carlus Pavlov, MD Taking Active   ezetimibe (ZETIA) 10 MG tablet  [id number] Yes TAKE 1 TABLET BY MOUTH EVERY DAY Tower, Audrie Gallus, MD Taking Active Spouse/Significant Other  famotidine (PEPCID) 20 MG tablet [id number] Yes TAKE 1 TABLET BY MOUTH EVERYDAY AT BEDTIME Nyoka Cowden, MD Taking Active   furosemide (LASIX) 20 MG tablet [id number] Yes TAKE 1 TABLET BY MOUTH EVERY DAY Gollan, Tollie Pizza, MD Taking Active Spouse/Significant Other  gabapentin (NEURONTIN) 300 MG capsule [id number] Yes Take 1 capsule (300 mg total) by mouth 3 (three) times daily. Nyoka Cowden, MD Taking Active   Glucosamine HCl (GLUCOSAMINE PO) [id number] Yes Take 1,500 mg by mouth 2 (two) times daily.  [provider] Taking Active Spouse/Significant Other  glucose blood (FREESTYLE LITE) test strip [id number] Yes 1 each by Other route 4 (four) times daily. E11.65 Romero Belling, MD Taking Active Spouse/Significant Other  insulin isophane & regular human KwikPen (NOVOLIN 70/30 KWIKPEN) (70-30) 100 UNIT/ML KwikPen [id number] Yes Inject 45 Units into the skin daily with breakfast. And pen needles 1/day Carlus Pavlov, MD Taking Active   insulin lispro (HUMALOG KWIKPEN) 200 UNIT/ML KwikPen [id number] Yes INJECT 12-15 UNITS INTO THE SKIN 2 (TWO) TIMES DAILY BEFORE A MEAL.  Patient taking differently: Inject 20 Units into the skin 2 (two) times daily before a meal. INJECT 12-15 UNITS INTO THE SKIN 2 (TWO) TIMES DAILY BEFORE A MEAL.   Carlus Pavlov, MD Taking Active   Insulin Pen Needle 32G X 4 MM MISC [id number] Yes Use 3x a day Carlus Pavlov, MD Taking Active   ketoconazole (NIZORAL) 2 % cream [id number] Yes Apply 1 Application topically 2 (two) times daily. [provider] Taking Active   Lancets (FREESTYLE) lancets [id number] Yes 1 EACH BY OTHER ROUTE 4 (FOUR) TIMES DAILY. E11.65 Romero Belling, MD Taking Active Spouse/Significant Other  Lutein 20 MG CAPS [id number] Yes Take 20 mg by mouth daily.  [provider] Taking Active Spouse/Significant Other  meclizine (ANTIVERT)  12.5 MG tablet  [id number] No TAKE 1-2 TABLETS BY MOUTH THREE TIMES A DAY AS NEEDED FOR DIZZINESS  Patient not taking: Reported on 12/31/2022   [provider] Not Taking Active   metoprolol succinate (TOPROL-XL) 25 MG 24 hr tablet [id number] Yes TAKE 0.5 TABLETS BY MOUTH AT BEDTIME. Antonieta Iba, MD Taking Active   nitroGLYCERIN (NITROSTAT) 0.4 MG SL tablet [id number] Yes Place 1 tablet (0.4 mg total) under the tongue every 5 (five) minutes x 3 doses as needed for chest pain. Rai, Delene Ruffini, MD Taking Active Spouse/Significant Other  omeprazole (PRILOSEC) 40 MG capsule [id number] Yes TAKE 1 CAPUSLE BY MOUTH 30- 60 MIN BEFORE YOUR FIRST AND LAST MEALS OF THE DAY Nyoka Cowden, MD Taking Active   polyethylene glycol (MIRALAX / GLYCOLAX) 17 g packet [id number] Yes Take 17 g by mouth daily. [provider] Taking Active Spouse/Significant Other  predniSONE (DELTASONE) 10 MG tablet [id number] Yes TAKE 2 TABLETS BY MOUTH UNTIL BETTER THEN TAPER TO 1/2 TABLET Nyoka Cowden, MD Taking Active   rosuvastatin (CRESTOR) 40 MG tablet [id number] Yes TAKE 1 TABLET BY MOUTH EVERY DAY Tower, Audrie Gallus, MD Taking Active   Semaglutide, 2 MG/DOSE, (OZEMPIC, 2 MG/DOSE,) 8 MG/3ML SOPN [id number] Yes Inject 2 mg into the skin once a week. Carlus Pavlov, MD Taking Active   Med List Note Maple Hudson, Rennis Chris, MD 10/05/11 1705): CPAP 11/ Christoper Allegra            SDOH:  (Social Determinants of Health) assessments and interventions performed: No SDOH Interventions    Flowsheet Row Telephone from 08/20/2022 in [facility] Coordination Clinical Support from 04/17/2022 in [facility] Clinical Support from 04/10/2021 in [facility] Clinical Support from 04/09/2020 in [facility] [place]  SDOH Interventions      Food Insecurity Interventions Intervention Not Indicated Intervention Not Indicated --  --  Housing Interventions Intervention Not Indicated Intervention Not Indicated -- --  Transportation Interventions Intervention Not Indicated Intervention Not Indicated -- --  Depression Interventions/Treatment  -- -- VFI4-3 Score <4 Follow-up Not Indicated PHQ2-9 Score <4 Follow-up Not Indicated  Financial Strain Interventions -- Intervention Not Indicated -- --  Physical Activity Interventions -- Intervention Not Indicated -- --  Stress Interventions -- Intervention Not Indicated -- --  Social Connections Interventions -- Intervention Not Indicated -- --       Medication Assistance: None required.  Patient affirms current coverage meets needs.  Medication Access: Within the past 30 days, how often has patient missed a dose of medication? 0 Is a pillbox or other method used to improve adherence? Yes  Factors that may affect medication adherence? no barriers identified Are meds synced by current pharmacy? No  Are meds delivered by current pharmacy? No  Does patient experience delays in picking up medications due to transportation concerns? No   [facility] Services Reviewed: Is patient disadvantaged to use [facility]?: Yes  Current Rx insurance plan: HTA Name and location of Current pharmacy:  [id number] [address] [place] [address] Phone: [phone] Fax: [phone]  [facility] services reviewed with patient today?: No  Patient requests to transfer care to [facility]?: No  Reason patient declined to change pharmacies: Disadvantaged due to insurance/mail order  Compliance/Adherence/Medication fill history: Care Gaps: None  Star-Rating Drugs: Rosuvastatin - PDC 100%   Assessment/Plan  Dizziness/Falls  -pt reports orthostatic symptoms have returned  over the past month including a few falls; notable recent changes include addition of gabapentin 1 month ago for cough and back pain, which pt reports  have improved significantly; he has not checked BP at home; he has not been using meclizine due to concern that it was causing weight gain as he was taking it daily -Discussed gabapentin could be causing symptoms; also discussed role of meclizine as PRN drug for dizziness, it is not known for causing weight gain; multiple factors cold be contributing to weight gain including higher dose of prednisone; he is working on reducing dose of prednisone Recommendations: -Advised trial of gabapentin dose reduction: 100 mg BID with option to increase up to 200 mg BID for control of symptoms without falls -Advised to use meclizine PRN for dizziness/vertigo -Advised to monitor glucose closely while reducing prednisone (he has CGM); contact endocrine for insulin adjustments if needed  Hypertension / Heart Failure (BP goal <130/80) -Controlled - pt reports he has had no further falls/orthostatic events since switching metoprolol tartrate to succinate last month; he denies elevated HR/palpitations -Last ejection fraction: 60-65% (Date: 09/2021) -HF type: Diastolic (Grade I dysfunction) -Pt weights himself daily, reports dry weight around ~235# -Current treatment: Furosemide 20 mg daily - Appropriate, Effective, Safe, Accessible Metoprolol succinate 25 mg - 1/2 tab daily -Appropriate, Effective, Safe, Accessible -Medications previously tried: isosorbide, Jardiance -Educated on BP goals and benefits of medications for prevention of heart attack, stroke and kidney damage; Symptoms of hypotension and importance of maintaining adequate hydration; -Educated on Importance of weighing daily; if you gain more than 3 pounds in one day or 5 pounds in one week, take whole tablet of furosemide -Counseled to monitor BP/HR at home daily -Recommend to continue current medication   Hyperlipidemia: (LDL goal < 70) -Controlled - LDL 78 (04/2022), TRIG 325 -Hx CAD; CABG 1987, cardiac cath 09/1021 - diffuse disease, not a candidate  for intervention -Current treatment: Rosuvastatin 40 mg daily - Appropriate, Effective, Safe, Accessible Ezetimibe 10 mg daily -Appropriate, Effective, Safe, Accessible Aspirin 81 mg daily -Appropriate, Effective, Safe, Accessible Nitroglycerin 0.4 mg SL prn -Appropriate, Effective, Safe, Accessible -Medications previously tried: n/a  -Current exercise habits: limited -Educated on Cholesterol goals; Benefits of statin for ASCVD risk reduction; -Recommended to continue current medication   Diabetes (A1c goal <7%) -Not ideally controlled - A1c 7.9% (08/2022) above goal (while off of Ozempic in s/o supply issues); he is now back on Ozempic; he is wearing CGM and denies significant hypoglycemic events -Current medications: Novolin 70/30 - 45 units daily AM -Appropriate, Effective, Safe, Accessible Humalog 12-15 units BID Ozempic 2 mg weekly - Freestyle Libre 2 -Appropriate, Effective, Safe, Accessible -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 -Denies hypoglycemic/hyperglycemic symptoms -Educated on A1c and blood sugar goals; Prevention and management of hypoglycemic episodes; Continuous glucose monitoring - discussed overnight low glucose in absence of symptoms may not be true hypoglycemia, always double check with finger stick -Recommended to continue current medication   GERD (Goal: minimize symptoms of reflux ) -Controlled -Current treatment  Famotidine 20 mg daily HS - Appropriate, Effective, Safe, Accessible Omeprazole 40 mg daily BID -Appropriate, Effective, Safe, Accessible -Medications previously tried: none reported  -Hx of Bleeds/ulcers: No -Counseled on small meals, elevating head, and sleeping on left side -Recommended to continue current medication   Gout (Goal: Prevent gout flares) -Controlled -Last Gout Flare: years ago - prior to allopurinol -Current treatment  Allopurinol 100 mg BID - Appropriate, Effective, Safe, Accessible Colchicine 0.6 mg PRN -Appropriate,  Effective, Safe, Accessible -  Medications previously tried: n/a  -We discussed:  Counseled patient on low purine diet plan. Counseled patient to reduce consumption of high-fructose corn syrup, sweetened soft drinks, fruit juices, meat, and seafood. -Recommended to continue current medication   Chronic Kidney Disease Stage 4  -All medications assessed for renal dosing and appropriateness in chronic kidney disease. -Follows with Nephrology (Dr Thedore Mins); now off SGLT2-I and ARB -Recommended to continue current medication   BOOP (Goal: manage symptoms) -Follows with pulmonology. No indication for O2 as of 05/2022 walk test -Hx OSA, on CPAP -Current treatment  Prednisone 10 mg -2 tab daily - Appropriate, Effective, Safe, Accessible Gabapentin 300 mg TID - Appropriate, Effective, Query Safe Benzonatate 200 mg -Appropriate, Effective, Safe, Accessible -Medications previously tried: gabapentin  -Recommended to continue current medication   Health Maintenance -OTC: Calcium-Vitamin D, glucosamine, lutein -Vertigo: meclizine  Al Corpus, PharmD, P[place]y [place], CPP Clinical Pharmacist Practitioner [facility] at [facility] [phone]

## 2022-12-31 NOTE — Patient Instructions (Signed)
Visit Information  Phone number for Pharmacist: [phone]  Thank you for meeting with me to discuss your medications! Below is a summary of what we talked about during the visit:   Try to reduce gabapentin: try 100 mg twice daily, can increase by 1 capsule per dose as needed.  You can use meclizine as needed for dizziness/veritgo.  If you are reducing prednisone, make sure to keep a close eye on the blood sugars. You may need to reduce insulin.   Al Corpus, PharmD, BCACP Clinical Pharmacist [facility] at [facility] [phone]

## 2023-01-01 DIAGNOSIS — M5416 Radiculopathy, lumbar region: Secondary | ICD-10-CM | POA: Diagnosis not present

## 2023-01-01 DIAGNOSIS — M791 Myalgia, unspecified site: Secondary | ICD-10-CM | POA: Diagnosis not present

## 2023-01-02 DIAGNOSIS — G4733 Obstructive sleep apnea (adult) (pediatric): Secondary | ICD-10-CM | POA: Diagnosis not present

## 2023-01-06 ENCOUNTER — Ambulatory Visit: Payer: PPO | Admitting: Internal Medicine

## 2023-01-06 NOTE — Progress Notes (Deleted)
Patient ID: Kurt Aguilar, male   DOB: December 12, 1945, 77 y.o.   MRN: [id number]  HPI: Kurt Aguilar is a 77 y.o.-year-old male, returning for follow-up for DM2, dx in 2014, insulin-dependent in 2018-2019 and again since 2023, uncontrolled, with complications (CAD - s/p AMIs, diastolic CHF, CKD stage III, peripheral neuropathy). Pt. previously saw Dr. Everardo All, but last visit 4 months ago.   He is here with his wife who offers part of the history, especially regarding his diabetic medications, past medical history, and diet.  Interim history: He continues to have increased urination, also blurry vision. No nausea, chest pain. Before last visit, he was admitted for dehydration 2/2 viral gastroenteritis >> taken off Jardiance and also he is also off Ozempic 2/2 unavailability.  We tried to start Ozempic since then but he was not able to do so. He had COVID-19 in 10/2022. He contacted [place] with high blood sugars, while on prednisone, last month, and we increase his insulin doses.  Reviewed HbA1c: Lab Results  Component Value Date   HGBA1C 7.9 (A) 09/05/2022   HGBA1C 6.6 (A) 05/06/2022   HGBA1C 6.7 (A) 01/09/2022   HGBA1C 8.0 (A) 11/14/2021   HGBA1C 9.5 (A) 09/13/2021   HGBA1C 6.5 (A) 08/15/2020   HGBA1C 6.6 (H) 05/17/2020   HGBA1C 6.3 (A) 02/10/2020   HGBA1C 5.8 (A) 08/15/2019   HGBA1C 6.3 03/24/2019   Pt is on a regimen of: - NPH/regular insulin 70/30 35 units in am (30 min before b'fast) >> 45-55 units in am (7 am) - Novolog 16-18 units before meals - Ozempic 2 mg weekly - off 2/2 availability at the pharmacy He was taken off Jardiance 10 mg daily by his nephrologist in 08/2022.  Pt checks his sugars >4x a day and they are:  Previously:  Previously:  Lowest sugar was 66 >> 119; he has hypoglycemia awareness at 70.  Highest sugar was 254 >> 370s.  Glucometer: Freestyle lite  Meals: - breakfast: eggs and bacon; eggs benedict; oats and toast with PB - lunch: pork chops + mashed  potatoes + succotash or similar; pasta - dinner: spaghetti + salad + cookie  - +CKD-sees nephrology-Dr. Thedore Mins, last BUN/creatinine:  Lab Results  Component Value Date   BUN 40 (H) 08/25/2022   BUN 44 (H) 08/19/2022   CREATININE 2.62 (H) 08/25/2022   CREATININE 2.42 (H) 08/19/2022  On lisinopril 2.5 mg daily.  -+ HL; last set of lipids: Lab Results  Component Value Date   CHOL 169 04/09/2022   HDL 51.80 04/09/2022   LDLCALC 33 02/14/2022   LDLDIRECT 78.0 04/09/2022   TRIG 325.0 (H) 04/09/2022   CHOLHDL 3 04/09/2022  On Crestor 40 mg daily and Zetia 10 mg daily.  - last eye exam was 11/21/2022. No DR.   - no numbness and tingling in his feet.  Last foot exam 04/16/2022.  He is on gabapentin prescribed by Dr. Sherene Sires.  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  ROS: + see HPI  Past Medical History:  Diagnosis Date   Arthritis    Chronic kidney disease    Complication of anesthesia    constipation and inability to  urinate happened a few days after cataract surgery    Diabetes (HCC)    type 2    Diverticulitis    GERD (gastroesophageal reflux disease)    Gout    Heart attack (HCC)    Heart  disease    Heart murmur    Hyperglycemia    Hyperlipidemia    Hypertension    Osteoporosis    Pneumonia    hx of BOOP followed by Dr Sherene Sires    Sleep apnea    cpapp - setting at 15    Past Surgical History:  Procedure Laterality Date   ACROMIO-CLAVICULAR JOINT REPAIR Left 05/05/2019   Procedure: SHOULDER ARTHROSCOPIC ASSISTED ACROMIO-CLAVICULAR JOINT REPAIR;  Surgeon: Jones Broom, MD;  Location: [facility];  Service: Orthopedics;  Laterality: Left;   CATARACT EXTRACTION Bilateral    CORONARY ARTERY BYPASS GRAFT     [place]   LEFT HEART CATH AND CORS/GRAFTS ANGIOGRAPHY N/A 09/19/2021   Procedure: LEFT HEART CATH AND CORS/GRAFTS ANGIOGRAPHY;  Surgeon: Lyn Records, MD;  Location:  [facility];  Service: Cardiovascular;  Laterality: N/A;   open heart surgery  09/09/1995-1998   5 bypass   REPAIR KNEE LIGAMENT     right   SHOULDER ARTHROSCOPY Left 05/05/2019   Procedure: ARTHROSCOPY SHOULDER;  Surgeon: Jones Broom, MD;  Location: [facility];  Service: Orthopedics;  Laterality: Left;   TONSILLECTOMY     TOTAL KNEE ARTHROPLASTY Left 05/29/2020   Procedure: LEFT TOTAL KNEE ARTHROPLASTY;  Surgeon: Marcene Corning, MD;  Location: [facility];  Service: Orthopedics;  Laterality: Left;   Social History   Socioeconomic History   Marital status: Married    Spouse name: Not on file   Number of children: 4   Years of education: 14   Highest education level: Not on file  Occupational History   Occupation: Engineer, petroleum: RETIRED  Tobacco Use   Smoking status: Former    Packs/day: 1.00    Years: 3.00    Additional pack years: 0.00    Total pack years: 3.00    Types: Cigars, Cigarettes    Quit date: 11/25/2004    Years since quitting: 18.1   Smokeless tobacco: Never   Tobacco comments:    occas. cigar quit 2006  Vaping Use   Vaping Use: Never used  Substance and Sexual Activity   Alcohol use: Yes    Alcohol/week: 1.0 standard drink of alcohol    Types: 1 Standard drinks or equivalent per week    Comment: rare   Drug use: No   Sexual activity: Never  Other Topics Concern   Not on file  Social History Narrative   Not on file   Social Determinants of Health   Financial Resource Strain: Low Risk  (04/17/2022)   Overall Financial Resource Strain (CARDIA)    Difficulty of Paying Living Expenses: Not hard at all  Food Insecurity: No Food Insecurity (08/20/2022)   Hunger Vital Sign    Worried About Running Out of Food in the Last Year: Never true    Ran Out of Food in the Last Year: Never true  Transportation Needs: No Transportation Needs (08/20/2022)   PRAPARE - Administrator, Civil Service (Medical): No    Lack of Transportation  (Non-Medical): No  Physical Activity: Insufficiently Active (04/17/2022)   Exercise Vital Sign    Days of Exercise per Week: 5 days    Minutes of Exercise per Session: 20 min  Stress: No Stress Concern Present (04/17/2022)   [employer] of Occupational Health - Occupational Stress Questionnaire    Feeling of Stress : Only a little  Social Connections: Moderately Integrated (04/17/2022)   Social Connection and Isolation Panel [NHANES]    Frequency  of Communication with Friends and Family: More than three times a week    Frequency of Social Gatherings with Friends and Family: Once a week    Attends Religious Services: More than 4 times per year    Active Member of [employer] or Organizations: No    Attends Banker Meetings: Never    Marital Status: Married  Catering manager Violence: Not At Risk (08/18/2022)   Humiliation, Afraid, Rape, and Kick questionnaire    Fear of Current or Ex-Partner: No    Emotionally Abused: No    Physically Abused: No    Sexually Abused: No   Current Outpatient Medications on File Prior to Visit  Medication Sig Dispense Refill   allopurinol (ZYLOPRIM) 100 MG tablet TAKE 1 TABLET BY MOUTH TWICE A DAY 180 tablet 3   aspirin EC (ASPIRIN 81) 81 MG tablet Take 81 mg by mouth daily. Swallow whole.     benzonatate (TESSALON) 200 MG capsule Take 1 capsule (200 mg total) by mouth 2 (two) times daily as needed for cough. 20 capsule 0   Calcium Carbonate-Vit D-Min (CALCIUM-VITAMIN D-MINERALS) 600-800 MG-UNIT CHEW Chew 1 tablet by mouth daily.     colchicine 0.6 MG tablet Take 1 tablet (0.6 mg total) by mouth as needed (GOUT FLARE). TAKE 1 TABLET BY MOUTH TWICE A DAY WITH A MEAL AS NEEDED FOR ACUTE FLARE OF GOUT     Continuous Blood Gluc Receiver (FREESTYLE LIBRE 2 READER) DEVI USE AS INSTRUCTED TO CHECK BLOOD SUGARS. 1 each 0   Continuous Blood Gluc Sensor (FREESTYLE LIBRE 2 SENSOR) MISC 1 Device by Does not apply route every 14 (fourteen) days. 6 each 3    ezetimibe (ZETIA) 10 MG tablet TAKE 1 TABLET BY MOUTH EVERY DAY 90 tablet 3   famotidine (PEPCID) 20 MG tablet TAKE 1 TABLET BY MOUTH EVERYDAY AT BEDTIME 90 tablet 1   furosemide (LASIX) 20 MG tablet TAKE 1 TABLET BY MOUTH EVERY DAY 90 tablet 3   gabapentin (NEURONTIN) 300 MG capsule Take 1 capsule (300 mg total) by mouth 3 (three) times daily. 270 capsule 2   Glucosamine HCl (GLUCOSAMINE PO) Take 1,500 mg by mouth 2 (two) times daily.      glucose blood (FREESTYLE LITE) test strip 1 each by Other route 4 (four) times daily. E11.65 400 strip 0   insulin isophane & regular human KwikPen (NOVOLIN 70/30 KWIKPEN) (70-30) 100 UNIT/ML KwikPen Inject 45 Units into the skin daily with breakfast. And pen needles 1/day 30 mL 3   insulin lispro (HUMALOG KWIKPEN) 200 UNIT/ML KwikPen INJECT 12-15 UNITS INTO THE SKIN 2 (TWO) TIMES DAILY BEFORE A MEAL. (Patient taking differently: Inject 20 Units into the skin 2 (two) times daily before a meal. INJECT 12-15 UNITS INTO THE SKIN 2 (TWO) TIMES DAILY BEFORE A MEAL.) 30 mL 3   Insulin Pen Needle 32G X 4 MM MISC Use 3x a day 300 each 0   ketoconazole (NIZORAL) 2 % cream Apply 1 Application topically 2 (two) times daily.     Lancets (FREESTYLE) lancets 1 EACH BY OTHER ROUTE 4 (FOUR) TIMES DAILY. E11.65 400 each 0   Lutein 20 MG CAPS Take 20 mg by mouth daily.      meclizine (ANTIVERT) 12.5 MG tablet TAKE 1-2 TABLETS BY MOUTH THREE TIMES A DAY AS NEEDED FOR DIZZINESS (Patient not taking: Reported on 12/31/2022)     metoprolol succinate (TOPROL-XL) 25 MG 24 hr tablet TAKE 0.5 TABLETS BY MOUTH AT BEDTIME. 45 tablet 2  nitroGLYCERIN (NITROSTAT) 0.4 MG SL tablet Place 1 tablet (0.4 mg total) under the tongue every 5 (five) minutes x 3 doses as needed for chest pain. 30 tablet 12   omeprazole (PRILOSEC) 40 MG capsule TAKE 1 CAPUSLE BY MOUTH 30- 60 MIN BEFORE YOUR FIRST AND LAST MEALS OF THE DAY 180 capsule 3   polyethylene glycol (MIRALAX / GLYCOLAX) 17 g packet Take 17 g by  mouth daily.     predniSONE (DELTASONE) 10 MG tablet TAKE 2 TABLETS BY MOUTH UNTIL BETTER THEN TAPER TO 1/2 TABLET 100 tablet 1   rosuvastatin (CRESTOR) 40 MG tablet TAKE 1 TABLET BY MOUTH EVERY DAY 90 tablet 2   Semaglutide, 2 MG/DOSE, (OZEMPIC, 2 MG/DOSE,) 8 MG/3ML SOPN Inject 2 mg into the skin once a week. 9 mL 3   No current facility-administered medications on file prior to visit.   Allergies  Allergen Reactions   Oxycodone-Acetaminophen Other (See Comments)    Stops breathing; has tolerated Tylenol before   Oxycodone-Acetaminophen Anaphylaxis   Family History  Problem Relation Age of Onset   Heart disease Mother    Heart disease Father    Stroke Father    Heart attack Son 21   Kidney disease Neg Hx    Prostate cancer Neg Hx    Diabetes Neg Hx    PE: There were no vitals taken for this visit. Wt Readings from Last 3 Encounters:  11/28/22 243 lb (110.2 kg)  10/09/22 246 lb 9 oz (111.8 kg)  09/05/22 246 lb 9.6 oz (111.9 kg)   Constitutional: overweight, in NAD Eyes: no exophthalmos ENT: no thyromegaly, no cervical lymphadenopathy Cardiovascular: RRR, No RG, + 1/6 SEM, + B pitting LE edema Respiratory: CTA B Musculoskeletal: no deformities Skin: no rashes Neurological: no tremor with outstretched hands  ASSESSMENT: 1. DM2, insulin-dependent, uncontrolled, without long-term complications - CAD - diastolic CHF - CKD stage III - peripheral neuropathy  2.  Hyperlipidemia  PLAN:  1. Patient with longstanding, uncontrolled, type 2 diabetes, on basal/bolus insulin regimen with still poor control.  At last visit, HbA1c was higher, at 7.9%.  At that time, sugars were increasing in a stepwise fashion throughout the day, with the largest increases after lunch and dinner.  He was off Ozempic and Jardiance after an episode of gastroenteritis with dehydration.  At that time, he had lactic acidosis and AKI and nephrology recommended to start Jardiance and not restart it in the  future.  We did try to start Ozempic but he was not able to obtain this. Follow-up CGM interpretation: -At today's visit, we reviewed his CGM downloads: It appears that *** of values are in target range (goal >70%), while *** are higher than 180 (goal <25%), and *** are lower than 70 (goal <4%).  The calculated average blood sugar is ***.  The projected HbA1c for the next 3 months (GMI) is ***. -Reviewing the CGM trends, ***  - I suggested to:  Patient Instructions  Please continue: - NPH/regular insulin 70/30 45 units in am - Novolog 12-15 units before lunch and dinner  Please return in 3-4 months.   - we checked his HbA1c: 7%  - advised to check sugars at different times of the day - 4x a day, rotating check times - advised for yearly eye exams >> he is UTD - return to clinic in 3-4 months  2.  Hyperlipidemia -The latest lipid panel from 04/2022: LDL above our goal of less than 55 due to cardiovascular disease, triglycerides  high: Lab Results  Component Value Date   CHOL 169 04/09/2022   HDL 51.80 04/09/2022   LDLCALC 33 02/14/2022   LDLDIRECT 78.0 04/09/2022   TRIG 325.0 (H) 04/09/2022   CHOLHDL 3 04/09/2022  -He continues Crestor 40 mg daily and Zetia 10 mg daily without side effects   Carlus Pavlov, MD PhD [facility]

## 2023-01-07 DIAGNOSIS — H26492 Other secondary cataract, left eye: Secondary | ICD-10-CM | POA: Diagnosis not present

## 2023-01-10 NOTE — Progress Notes (Unsigned)
Cardiology Office Note  Date:  01/12/2023   ID:  Kurt Aguilar, DOB 1945-11-25, MRN [id number]  PCP:  Judy Pimple, MD   Chief Complaint  Patient presents with   Elevated BP & HR     Patient c/o angina at times, shortness of breath, bilateral LE edema, elevated BP and HR. Medications reviewed by the patient verbally.     HPI:  77 yo with history of  CAD,  CABG back in 1997 in [place], [place], HTN,  Diabetes with chronic renal insufficiency hyperlipidemia,  gout He lost his son in early 2016 to cardiac arrest Aortic valve murmur/aortic valve disease h/o reactive airway symptoms, Boop,  Chronic renal insufficiency presents for routine followup of his coronary artery disease, CRI   LOV 12/23  Echo January 2023 EF 60 to 65%  Cardiac catheterization January 2023 Medical management recommended  Lab work reviewed Total cholesterol 169 LDL 78 Recent prednisone pulse   hospital admission 12/23, getting over gastroenteritis, acute renal injury, On prednisone taper for boop, was up to 40 mg now tapering down to 10  Covid in jan 2024 Does not qualify for oxygen per pulmonary He does report having saturations into the 80s on ambulation at home Seen by pulmonary 3/24: heart rate 112  Heart rate today 118 bpm, no improvement at the end of our visit Typically baseline heart rates in the 60s on low-dose metoprolol succinate Feels deconditioned, starting PT for his back  Previously reported having near syncope and syncope on metoprolol tartrate and was changed to metoprolol succinate  EKG personally reviewed by myself on todays visit Sinus tachycardia rate 118 bpm right bundle branch block consider old inferior MI  Other past medical history reviewed COVID-19 in December (positive on 12/4), underlying history of BOOP  hospitalization January 2023, Non-STEMI Cardiac catheterization performed multivessel native coronary artery diseasesaphenous vein to right coronary artery with  70% ostial to proximal stenosis, SVG to OM 1 and 2 patent, SVG to first diagonal patent, LIMA to LAD patent.   The saphenous vein graft to the right coronary can be stented although it is not so critical that it should be causing enzyme elevation.  Enzyme elevation likely related to left coronary native disease in the septal perforator territory.  Echo 09/18/21 1. Left ventricular ejection fraction, by estimation, is 60 to 65%. The  left ventricle has normal function. The left ventricle has no regional  wall motion abnormalities. Left ventricular diastolic parameters are  consistent with Grade I diastolic  dysfunction (impaired relaxation). Elevated left ventricular end-diastolic  pressure.   2. Right ventricular systolic function is moderately reduced. The right  ventricular size is normal.   3. The mitral valve is normal in structure. No evidence of mitral valve  regurgitation. No evidence of mitral stenosis.   4. The AV is poorly visualized to comment on structure. There does appear  to be come calcification of the valve. . The aortic valve was not well  visualized. Aortic valve regurgitation is not visualized. No aortic  stenosis is present.   h/o reactive airway symptoms, Boop PreviouslyStarted on prednisoneMarch 2017  for shortness of breath, improvement of symptoms,  cough returned after prednisone was discontinued getting close To his baseline Minimal wheezing and cough predisone 5 mg daily, Plan may be to wean the dose Managed by Dr. Sherene Sires , Pulmonary   PMH:   has a past medical history of Arthritis, Chronic kidney disease, Complication of anesthesia, Diabetes (HCC), Diverticulitis, GERD (gastroesophageal reflux disease), Gout,  Heart attack (HCC), Heart disease, Heart murmur, Hyperglycemia, Hyperlipidemia, Hypertension, Osteoporosis, Pneumonia, and Sleep apnea.  PSH:    Past Surgical History:  Procedure Laterality Date   ACROMIO-CLAVICULAR JOINT REPAIR Left 05/05/2019    Procedure: SHOULDER ARTHROSCOPIC ASSISTED ACROMIO-CLAVICULAR JOINT REPAIR;  Surgeon: Jones Broom, MD;  Location: [facility];  Service: Orthopedics;  Laterality: Left;   CATARACT EXTRACTION Bilateral    CORONARY ARTERY BYPASS GRAFT     [place]   LEFT HEART CATH AND CORS/GRAFTS ANGIOGRAPHY N/A 09/19/2021   Procedure: LEFT HEART CATH AND CORS/GRAFTS ANGIOGRAPHY;  Surgeon: Lyn Records, MD;  Location: [facility];  Service: Cardiovascular;  Laterality: N/A;   open heart surgery  09/09/1995-1998   5 bypass   REPAIR KNEE LIGAMENT     right   SHOULDER ARTHROSCOPY Left 05/05/2019   Procedure: ARTHROSCOPY SHOULDER;  Surgeon: Jones Broom, MD;  Location: [facility];  Service: Orthopedics;  Laterality: Left;   TONSILLECTOMY     TOTAL KNEE ARTHROPLASTY Left 05/29/2020   Procedure: LEFT TOTAL KNEE ARTHROPLASTY;  Surgeon: Marcene Corning, MD;  Location: [facility];  Service: Orthopedics;  Laterality: Left;    Current Outpatient Medications  Medication Sig Dispense Refill   allopurinol (ZYLOPRIM) 100 MG tablet TAKE 1 TABLET BY MOUTH TWICE A DAY 180 tablet 3   aspirin EC (ASPIRIN 81) 81 MG tablet Take 81 mg by mouth daily. Swallow whole.     benzonatate (TESSALON) 200 MG capsule Take 1 capsule (200 mg total) by mouth 2 (two) times daily as needed for cough. 20 capsule 0   Calcium Carbonate-Vit D-Min (CALCIUM-VITAMIN D-MINERALS) 600-800 MG-UNIT CHEW Chew 1 tablet by mouth daily.     colchicine 0.6 MG tablet Take 1 tablet (0.6 mg total) by mouth as needed (GOUT FLARE). TAKE 1 TABLET BY MOUTH TWICE A DAY WITH A MEAL AS NEEDED FOR ACUTE FLARE OF GOUT     ezetimibe (ZETIA) 10 MG tablet TAKE 1 TABLET BY MOUTH EVERY DAY 90 tablet 3   famotidine (PEPCID) 20 MG tablet TAKE 1 TABLET BY MOUTH EVERYDAY AT BEDTIME 90 tablet 1   furosemide (LASIX) 20 MG tablet TAKE 1 TABLET BY MOUTH EVERY DAY 90 tablet 3   gabapentin (NEURONTIN) 300 MG capsule Take 1 capsule (300 mg total) by mouth 3 (three) times daily.  (Patient taking differently: Take 300 mg by mouth at bedtime.) 270 capsule 2   Glucosamine HCl (GLUCOSAMINE PO) Take 1,500 mg by mouth 2 (two) times daily.      insulin isophane & regular human KwikPen (NOVOLIN 70/30 KWIKPEN) (70-30) 100 UNIT/ML KwikPen Inject 45 Units into the skin daily with breakfast. And pen needles 1/day 30 mL 3   insulin lispro (HUMALOG KWIKPEN) 200 UNIT/ML KwikPen INJECT 12-15 UNITS INTO THE SKIN 2 (TWO) TIMES DAILY BEFORE A MEAL. (Patient taking differently: Inject 20 Units into the skin 2 (two) times daily before a meal. INJECT 12-15 UNITS INTO THE SKIN 2 (TWO) TIMES DAILY BEFORE A MEAL.) 30 mL 3   ketoconazole (NIZORAL) 2 % cream Apply 1 Application topically 2 (two) times daily.     Lutein 20 MG CAPS Take 20 mg by mouth daily.      meclizine (ANTIVERT) 12.5 MG tablet TAKE 1-2 TABLETS BY MOUTH THREE TIMES A DAY AS NEEDED FOR DIZZINESS     metoprolol succinate (TOPROL-XL) 25 MG 24 hr tablet TAKE 0.5 TABLETS BY MOUTH AT BEDTIME. 45 tablet 2   nitroGLYCERIN (NITROSTAT) 0.4 MG SL tablet Place 1 tablet (0.4 mg  total) under the tongue every 5 (five) minutes x 3 doses as needed for chest pain. 30 tablet 12   omeprazole (PRILOSEC) 40 MG capsule TAKE 1 CAPUSLE BY MOUTH 30- 60 MIN BEFORE YOUR FIRST AND LAST MEALS OF THE DAY 180 capsule 3   polyethylene glycol (MIRALAX / GLYCOLAX) 17 g packet Take 17 g by mouth daily.     predniSONE (DELTASONE) 10 MG tablet TAKE 2 TABLETS BY MOUTH UNTIL BETTER THEN TAPER TO 1/2 TABLET 100 tablet 1   rosuvastatin (CRESTOR) 40 MG tablet TAKE 1 TABLET BY MOUTH EVERY DAY 90 tablet 2   Semaglutide, 2 MG/DOSE, (OZEMPIC, 2 MG/DOSE,) 8 MG/3ML SOPN Inject 2 mg into the skin once a week. 9 mL 3   Continuous Blood Gluc Receiver (FREESTYLE LIBRE 2 READER) DEVI USE AS INSTRUCTED TO CHECK BLOOD SUGARS. (Patient not taking: Reported on 01/12/2023) 1 each 0   Continuous Blood Gluc Sensor (FREESTYLE LIBRE 2 SENSOR) MISC 1 Device by Does not apply route every 14  (fourteen) days. (Patient not taking: Reported on 01/12/2023) 6 each 3   glucose blood (FREESTYLE LITE) test strip 1 each by Other route 4 (four) times daily. E11.65 (Patient not taking: Reported on 01/12/2023) 400 strip 0   Insulin Pen Needle 32G X 4 MM MISC Use 3x a day (Patient not taking: Reported on 01/12/2023) 300 each 0   Lancets (FREESTYLE) lancets 1 EACH BY OTHER ROUTE 4 (FOUR) TIMES DAILY. E11.65 (Patient not taking: Reported on 01/12/2023) 400 each 0   No current facility-administered medications for this visit.     Allergies:   Oxycodone-acetaminophen and Oxycodone-acetaminophen   Social History:  The patient  reports that he quit smoking about 18 years ago. His smoking use included cigars and cigarettes. He has a 3.00 pack-year smoking history. He has never used smokeless tobacco. He reports current alcohol use of about 1.0 standard drink of alcohol per week. He reports that he does not use drugs.   Family History:   family history includes Heart attack (age of onset: 76) in his son; Heart disease in his father and mother; Stroke in his father.    Review of Systems: Review of Systems  Constitutional: Negative.   HENT: Negative.    Respiratory:  Positive for shortness of breath.   Cardiovascular: Negative.   Gastrointestinal: Negative.   Musculoskeletal: Negative.   Neurological: Negative.   Psychiatric/Behavioral: Negative.    All other systems reviewed and are negative.   PHYSICAL EXAM: VS:  BP 100/70 (BP Location: Left Arm, Patient Position: Sitting, Cuff Size: Normal)   Pulse (!) 117   Ht 5' 10.5" (1.791 m)   Wt 254 lb 6 oz (115.4 kg)   SpO2 97%   BMI 35.98 kg/m  , BMI Body mass index is 35.98 kg/m.  Constitutional:  oriented to person, place, and time. No distress.  HENT:  Head: Grossly normal Eyes:  no discharge. No scleral icterus.  Neck: No JVD, no carotid bruits  Cardiovascular: Regular, tachycardic, no murmurs appreciated Pulmonary/Chest: Scattered Rales  halfway up bilaterally Abdominal: Soft.  no distension.  no tenderness.  Musculoskeletal: Normal range of motion Neurological:  normal muscle tone. Coordination normal. No atrophy Skin: Skin warm and dry Psychiatric: normal affect, pleasant  Recent Labs: 04/09/2022: TSH 2.25 08/17/2022: B Natriuretic Peptide 205.2 08/18/2022: ALT 18; Hemoglobin 13.1; Platelets 185 08/25/2022: BUN 40; Creatinine, Ser 2.62; Potassium 4.6; Sodium 140    Lipid Panel Lab Results  Component Value Date   CHOL 169 04/09/2022  HDL 51.80 04/09/2022   LDLCALC 33 02/14/2022   TRIG 325.0 (H) 04/09/2022    Wt Readings from Last 3 Encounters:  01/12/23 254 lb 6 oz (115.4 kg)  11/28/22 243 lb (110.2 kg)  10/09/22 246 lb 9 oz (111.8 kg)     ASSESSMENT AND PLAN:  Syncope No recent symptoms of near syncope or syncope Reports symptoms were better after changing off metoprolol to tartrate to succinate  Sinus tachycardia Unable to exclude atrial tachycardia Rate seems locked-in at 118 bpm We have ordered a Holter monitor for further evaluation  Coronary artery disease with stable angina, bypass cardiac catheterization early 2023 At the time it was felt vein graft to RCA lesion does not appear to be critical.  Intervention would likely not provide significant clinical benefit Given tachycardia above, echocardiogram ordered  Pure hypercholesterolemia - Plan: EKG 12-Lead Cholesterol trending upwards, 20 pound weight gain in the past year on chronic prednisone If numbers continue to run high may need to add Repatha  Essential hypertension - Plan: EKG 12-Lead Continue Lasix, metoprolol Imdur on hold given orthostasis/syncope Blood pressure stable  Chronic moderate shortness of breath,  History of COPD exacerbation/Boop, followed by pulmonary Component of deconditioning, on chronic steroids Pulmonary reporting he does not qualify for oxygen  Controlled type 2 diabetes mellitus with diabetic nephropathy,  unspecified long term insulin use status (HCC) - Followed by endocrinology, reports Jardiance held  calorie restriction recommended  Morbid obesity due to excess calories (HCC) in setting of pred rx  -  Calorie restriction recommended   Total encounter time more than 40 minutes  Greater than 50% was spent in counseling and coordination of care with the patient      No orders of the defined types were placed in this encounter.     Signed, Dossie Arbour, M.D., Ph.D. 01/12/2023  [facility] [place], [place] [phone]

## 2023-01-12 ENCOUNTER — Ambulatory Visit: Payer: PPO | Attending: Cardiovascular Disease | Admitting: Cardiovascular Disease

## 2023-01-12 ENCOUNTER — Ambulatory Visit (INDEPENDENT_AMBULATORY_CARE_PROVIDER_SITE_OTHER): Payer: PPO

## 2023-01-12 ENCOUNTER — Encounter: Payer: Self-pay | Admitting: Cardiovascular Disease

## 2023-01-12 VITALS — BP 100/70 | HR 117 | Ht 70.5 in | Wt 254.4 lb

## 2023-01-12 DIAGNOSIS — B079 Viral wart, unspecified: Secondary | ICD-10-CM | POA: Diagnosis not present

## 2023-01-12 DIAGNOSIS — I5032 Chronic diastolic (congestive) heart failure: Secondary | ICD-10-CM

## 2023-01-12 DIAGNOSIS — N1832 Chronic kidney disease, stage 3b: Secondary | ICD-10-CM | POA: Diagnosis not present

## 2023-01-12 DIAGNOSIS — I10 Essential (primary) hypertension: Secondary | ICD-10-CM | POA: Diagnosis not present

## 2023-01-12 DIAGNOSIS — L821 Other seborrheic keratosis: Secondary | ICD-10-CM | POA: Diagnosis not present

## 2023-01-12 DIAGNOSIS — L570 Actinic keratosis: Secondary | ICD-10-CM | POA: Diagnosis not present

## 2023-01-12 DIAGNOSIS — I25118 Atherosclerotic heart disease of native coronary artery with other forms of angina pectoris: Secondary | ICD-10-CM

## 2023-01-12 DIAGNOSIS — L814 Other melanin hyperpigmentation: Secondary | ICD-10-CM | POA: Diagnosis not present

## 2023-01-12 DIAGNOSIS — Z7985 Long-term (current) use of injectable non-insulin antidiabetic drugs: Secondary | ICD-10-CM

## 2023-01-12 DIAGNOSIS — E1122 Type 2 diabetes mellitus with diabetic chronic kidney disease: Secondary | ICD-10-CM | POA: Diagnosis not present

## 2023-01-12 DIAGNOSIS — M6281 Muscle weakness (generalized): Secondary | ICD-10-CM | POA: Diagnosis not present

## 2023-01-12 DIAGNOSIS — E1169 Type 2 diabetes mellitus with other specified complication: Secondary | ICD-10-CM

## 2023-01-12 DIAGNOSIS — E782 Mixed hyperlipidemia: Secondary | ICD-10-CM | POA: Diagnosis not present

## 2023-01-12 DIAGNOSIS — E785 Hyperlipidemia, unspecified: Secondary | ICD-10-CM

## 2023-01-12 DIAGNOSIS — R55 Syncope and collapse: Secondary | ICD-10-CM | POA: Diagnosis not present

## 2023-01-12 DIAGNOSIS — L853 Xerosis cutis: Secondary | ICD-10-CM | POA: Diagnosis not present

## 2023-01-12 DIAGNOSIS — L218 Other seborrheic dermatitis: Secondary | ICD-10-CM | POA: Diagnosis not present

## 2023-01-12 DIAGNOSIS — R000 Tachycardia, unspecified: Secondary | ICD-10-CM | POA: Diagnosis not present

## 2023-01-12 DIAGNOSIS — R600 Localized edema: Secondary | ICD-10-CM | POA: Diagnosis not present

## 2023-01-12 DIAGNOSIS — M549 Dorsalgia, unspecified: Secondary | ICD-10-CM | POA: Diagnosis not present

## 2023-01-12 DIAGNOSIS — M5416 Radiculopathy, lumbar region: Secondary | ICD-10-CM | POA: Diagnosis not present

## 2023-01-12 NOTE — Patient Instructions (Addendum)
Medication Instructions:  No changes  If you need a refill on your cardiac medications before your next appointment, please call your pharmacy.   Lab work: No new labs needed  Testing/Procedures: Your physician has requested that you have an echocardiogram. Echocardiography is a painless test that uses sound waves to create images of your heart. It provides your doctor with information about the size and shape of your heart and how well your heart's chambers and valves are working. This procedure takes approximately one hour. There are no restrictions for this procedure. Please do NOT wear cologne, perfume, aftershave, or lotions (deodorant is allowed). Please arrive 15 minutes prior to your appointment time.   ZIO XT- Long Term Monitor Instructions  Your physician has requested you wear a ZIO patch monitor for 3 days.  This is a single patch monitor. [employer] supplies one patch monitor per enrollment. Additional stickers are not available. Please do not apply patch if you will be having a Nuclear Stress Test,  Echocardiogram, Cardiac CT, MRI, or Chest Xray during the period you would be wearing the  monitor. The patch cannot be worn during these tests. You cannot remove and re-apply the  ZIO XT patch monitor.  Your ZIO patch monitor will be mailed 3 day USPS to your address on file. It may take 3-5 days  to receive your monitor after you have been enrolled.  Once you have received your monitor, please review the enclosed instructions. Your monitor  has already been registered assigning a specific monitor serial # to you.  Billing and Patient Assistance Program Information  We have supplied [employer] with any of your insurance information on file for billing purposes. [employer] offers a sliding scale Patient Assistance Program for patients that do not have  insurance, or whose insurance does not completely cover the cost of the ZIO monitor.  You must apply for the Patient Assistance Program  to qualify for this discounted rate.  To apply, please call [employer] at [phone], select option 4, select option 2, ask to apply for  Patient Assistance Program. Meredeth Ide will ask your household income, and how many people  are in your household. They will quote your out-of-pocket cost based on that information.  [employer] will also be able to set up a 89-month, interest-free payment plan if needed.  Applying the monitor   Shave hair from upper left chest.  Hold abrader disc by orange tab. Rub abrader in 40 strokes over the upper left chest as  indicated in your monitor instructions.  Clean area with 4 enclosed alcohol pads. Let dry.  Apply patch as indicated in monitor instructions. Patch will be placed under collarbone on left  side of chest with arrow pointing upward.  Rub patch adhesive wings for 2 minutes. Remove white label marked "1". Remove the white  label marked "2". Rub patch adhesive wings for 2 additional minutes.  While looking in a mirror, press and release button in center of patch. A small green light will  flash 3-4 times. This will be your only indicator that the monitor has been turned on.  Do not shower for the first 24 hours. You may shower after the first 24 hours.  Press the button if you feel a symptom. You will hear a small click. Record Date, Time and  Symptom in the Patient Logbook.  When you are ready to remove the patch, follow instructions on the last 2 pages of Patient  Logbook. Stick patch monitor onto the last page of  Patient Logbook.  Place Patient Logbook in the blue and white box. Use locking tab on box and tape box closed  securely. The blue and white box has prepaid postage on it. Please place it in the mailbox as  soon as possible. Your physician should have your test results approximately 7 days after the  monitor has been mailed back to [facility]  Call [facility] Customer Care at [phone] if you have questions regarding  your ZIO XT  patch monitor. Call them immediately if you see an orange light blinking on your  monitor.  If your monitor falls off in less than 4 days, contact our Monitor department at [phone].  If your monitor becomes loose or falls off after 4 days call [employer] at [phone] for  suggestions on securing your monitor   Follow-Up: At [facility], you and your health needs are our priority.  As part of our continuing mission to provide you with exceptional heart care, we have created designated Provider Care Teams.  These Care Teams include your primary Cardiologist (physician) and Advanced Practice Providers (APPs -  Physician Assistants and Nurse Practitioners) who all work together to provide you with the care you need, when you need it.  You will need a follow up appointment in 3 weeks  Providers on your designated Care Team:   Nicolasa Ducking, NP Eula Listen, PA-C Cadence Fransico [place], [place]  COVID-19 Vaccine Information can be found at: [email or web address] For questions related to vaccine distribution or appointments, please email [email or web address] or call [phone].

## 2023-01-15 DIAGNOSIS — R000 Tachycardia, unspecified: Secondary | ICD-10-CM

## 2023-01-19 ENCOUNTER — Telehealth: Payer: Self-pay | Admitting: Cardiovascular Disease

## 2023-01-19 ENCOUNTER — Other Ambulatory Visit: Payer: Self-pay

## 2023-01-19 DIAGNOSIS — E1159 Type 2 diabetes mellitus with other circulatory complications: Secondary | ICD-10-CM

## 2023-01-19 MED ORDER — NITROGLYCERIN 0.4 MG SL SUBL: 0.4000 mg | SUBLINGUAL_TABLET | SUBLINGUAL | 1 refills | Status: DC | PRN

## 2023-01-19 MED ORDER — HUMALOG KWIKPEN 200 UNIT/ML ~~LOC~~ SOPN
20.0000 [IU] | PEN_INJECTOR | Freq: Two times a day (BID) | SUBCUTANEOUS | 3 refills | Status: DC
Start: 2023-01-19 — End: 2023-05-08

## 2023-01-19 NOTE — Telephone Encounter (Signed)
Requested Prescriptions   Signed Prescriptions Disp Refills   nitroGLYCERIN (NITROSTAT) 0.4 MG SL tablet 25 tablet 1    Sig: Place 1 tablet (0.4 mg total) under the tongue every 5 (five) minutes x 3 doses as needed for chest pain.    Authorizing Provider: GOLLAN, TIMOTHY J    Ordering User: NEWCOMER MCCLAIN, Geremy Rister L    

## 2023-01-19 NOTE — Telephone Encounter (Signed)
*  STAT* If patient is at the pharmacy, call can be transferred to refill team.   1. Which medications need to be refilled? (please list name of each medication and dose if known)   nitroGLYCERIN (NITROSTAT) 0.4 MG SL tablet     2. Which pharmacy/location (including street and city if local pharmacy) is medication to be sent to? .   [facility] [phone] [place], [place] - [address] Phone: [phone]  Fax: [phone]      3. Do they need a 30 day or 90 day supply? 90

## 2023-01-21 ENCOUNTER — Telehealth: Payer: Self-pay | Admitting: Family Medicine

## 2023-01-21 DIAGNOSIS — R110 Nausea: Secondary | ICD-10-CM | POA: Insufficient documentation

## 2023-01-21 DIAGNOSIS — H26492 Other secondary cataract, left eye: Secondary | ICD-10-CM | POA: Diagnosis not present

## 2023-01-21 MED ORDER — ONDANSETRON HCL 8 MG PO TABS: 8.0000 mg | ORAL_TABLET | Freq: Three times a day (TID) | ORAL | 0 refills | Status: DC | PRN

## 2023-01-21 NOTE — Telephone Encounter (Signed)
Please ask him about symptoms, what is he taking for? Triage if necessary  Thanks

## 2023-01-21 NOTE — Telephone Encounter (Signed)
Pt said he uses them very rarely so the Rx PCP gave him last did last a long time. Pt said sometimes when he takes his meds he gets a little nauseous after taking them but it's very infrequent. Pt was out of med and just wanted an updated Rx to have on hand. Pt isn't since and has no sxs right now

## 2023-01-21 NOTE — Telephone Encounter (Signed)
Prescription Request  01/21/2023  LOV: 08/25/2022  What is the name of the medication or equipment? ondansetron (ZOFRAN) 8 MG tablet   Have you contacted your pharmacy to request a refill? No   Which pharmacy would you like this sent to?  [id number] [place] [address] [place] [address] Phone: [phone] Fax: [phone]    Patient notified that their request is being sent to the clinical staff for review and that they should receive a response within 2 business days.   Please advise at Mobile [phone] (mobile)

## 2023-01-21 NOTE — Addendum Note (Signed)
Addended by: Roxy Manns A on: 01/21/2023 03:49 PM   Modules accepted: Orders

## 2023-01-21 NOTE — Telephone Encounter (Signed)
Not on med list but looking at prev fills, PCP has filled med once on 01/03/20 #15 tabs, please advise   CPE on 04/22/23

## 2023-01-22 DIAGNOSIS — R000 Tachycardia, unspecified: Secondary | ICD-10-CM | POA: Diagnosis not present

## 2023-01-24 ENCOUNTER — Other Ambulatory Visit: Payer: Self-pay | Admitting: Family Medicine

## 2023-01-24 ENCOUNTER — Other Ambulatory Visit: Payer: Self-pay | Admitting: Internal Medicine

## 2023-01-24 DIAGNOSIS — N1832 Chronic kidney disease, stage 3b: Secondary | ICD-10-CM

## 2023-01-27 ENCOUNTER — Telehealth: Payer: Self-pay

## 2023-01-27 NOTE — Progress Notes (Signed)
Care Management & Coordination Services Pharmacy Team  Reason for Encounter: Appointment Reminder  Contacted patient to confirm telephone appointment with Al Corpus , PharmD on 01/30/23 at 10:00. Unsuccessful outreach. Left voicemail for patient to return call.   Have you seen any other providers since your last visit with PCP? Yes  cardiology   Hospital visits:  None in previous 6 months   Star Rating Drugs:  Medication:  Last Fill: Day Supply Novolin 70/30  12/02/22 66 Humalog  01/23/23 90 Rosuvastatin 40mg  12/31/22 90 Ozempic   12/22/22 84  Care Gaps: Annual wellness visit in last year? Yes  If Diabetic: Last eye exam / retinopathy screening:UTD Last diabetic foot exam:UTD   Al Corpus, PharmD notified  Burt Knack, [facility] Clinical Pharmacy Assistant [phone]

## 2023-01-30 ENCOUNTER — Ambulatory Visit: Payer: PPO | Admitting: Pharmacist

## 2023-01-30 NOTE — Progress Notes (Signed)
Care Management & Coordination Services Pharmacy Note  01/30/2023 Name:  Kurt Aguilar MRN:  [id number] DOB:  03/17/1946  Summary: F/U visit -Dizziness/falls: Pt reports dizziness initially improved after changing gabapentin from 300 mg TID to 300 mg HS last month, but over the last few days dizziness has returned; typically occurs when standing; he is not checking BP -He has seen cardiology and had tachycardia, Holter monitor and ECHO ordered  Recommendations/Changes made from today's visit: -Advised to use meclizine PRN for dizziness/vertigo -Advised to monitor BP sitting and standing at home -Advised to follow up with cardiology for Holter/ECHO results; it is possible cardiac issues are contributing to dizziness  Follow up plan: -Pharmacist follow up televisit scheduled for 1 month -ECHO 02/05/23; Cardiology 02/19/23; PCP 04/22/23; Endocrine 05/08/23; Erline Hau 06/19/23    Subjective: Kurt Aguilar is an 77 y.o. year old male who is a primary patient of Tower, Audrie Gallus, MD.  The care coordination team was consulted for assistance with disease management and care coordination needs.    Engaged with patient by telephone for follow up visit.  Recent office visits: 08/25/22 Dr Milinda Antis OV: hospital f/u (gastroenteritis/dehydration) - [place] Pepper was d/c'd per nephrology  04/16/22 Dr Milinda Antis OV: annual - BP trending low. Unsure if he can cut back on metoprolol - reach out to cardiology.   Recent consult visits: 01/12/23 Dr Mariah Milling (Cardiology): f/u - HR 118. Ordered Holter monitor. Ordered ECHO.  11/28/22 Dr Sherene Sires (Pulm): BOOP - re-challenge gabapentin 100 mg QID 11/27/22 Dr Thedore Mins (Nephrology): f/u - pt has d/c'd lisinopril d/t dizziness. 09/05/22 Dr Elvera Lennox (Endocrine): A1c 7.9%; restart Ozempic 2 mg; if unable to find ozempic, start Novolog 12-15 units before lunch and dinner 08/29/22 Dr Sherene Sires (Pulm): BOOP 08/29/22 Dr Mariah Milling (Cardiology): CAD - imdur on hold d/t orthostasis 08/21/22 Dr Thedore Mins  (Nephrology): f/u - d/c Jardiance 06/19/22 Dr young (Pulmonology): OSA - continue BiPAP. 05/29/22 Dr Sherene Sires (Pulmonology): BOOP, chronic cough. Maintained on prednisone 10 mg. 05/06/22 Dr Elvera Lennox (Endocrine): A1c 6.6. Move Jardiance to before breakfast. 04/21/22 Dr Thedore Mins (Nephrology): BP 95/66. Try compression stockings. Take furosemide only PRN for significant edema.  Hospital visits: 08/17/22 - 08/19/22 Admission [facility]): sepsis 2/2 gastroenteritis. AKI. Stop imdur, Ozempic.   Objective:  Lab Results  Component Value Date   CREATININE 2.62 (H) 08/25/2022   BUN 40 (H) 08/25/2022   GFR 22.63 (L) 08/25/2022   EGFR 35 (L) 10/17/2021   GFRNONAA 27 (L) 08/19/2022   GFRAA 39 (L) 05/17/2020   NA 140 08/25/2022   K 4.6 08/25/2022   CALCIUM 9.1 08/25/2022   CO2 21 08/25/2022   GLUCOSE 169 (H) 08/25/2022    Lab Results  Component Value Date/Time   HGBA1C 7.9 (A) 09/05/2022 01:26 PM   HGBA1C 6.6 (A) 05/06/2022 01:40 PM   HGBA1C 6.6 (H) 05/17/2020 02:44 PM   HGBA1C 6.3 03/24/2019 09:25 AM   FRUCTOSAMINE 218 01/05/2017 12:02 PM   GFR 22.63 (L) 08/25/2022 12:08 PM   GFR 26.12 (L) 04/09/2022 08:11 AM   MICROALBUR 3.2 (H) 04/16/2022 11:56 AM   MICROALBUR 1.1 04/01/2019 03:34 PM    Last diabetic Eye exam:  Lab Results  Component Value Date/Time   HMDIABEYEEXA No Retinopathy 11/19/2022 08:47 AM    Last diabetic Foot exam: No results found for: "HMDIABFOOTEX"   Lab Results  Component Value Date   CHOL 169 04/09/2022   HDL 51.80 04/09/2022   LDLCALC 33 02/14/2022   LDLDIRECT 78.0 04/09/2022   TRIG 325.0 (H) 04/09/2022   CHOLHDL  3 04/09/2022       Latest Ref Rng & Units 08/18/2022    2:33 AM 08/17/2022    5:55 PM 04/09/2022    8:11 AM  Hepatic Function  Total Protein 6.5 - 8.1 g/dL 5.4  7.2  6.8   Albumin 3.5 - 5.0 g/dL 3.2  4.1  4.5   AST 15 - 41 U/L 21  33  24   ALT 0 - 44 U/L 18  26  35   Alk Phosphatase 38 - 126 U/L 35  50  51   Total Bilirubin 0.3 - 1.2 mg/dL 1.1  1.3  1.0      Lab Results  Component Value Date/Time   TSH 2.25 04/09/2022 08:11 AM   TSH 3.86 04/09/2021 07:54 AM       Latest Ref Rng & Units 08/18/2022    2:33 AM 08/17/2022    5:34 PM 04/09/2022    8:11 AM  CBC  WBC 4.0 - 10.5 K/uL 9.5  14.8  12.1   Hemoglobin 13.0 - 17.0 g/dL 40.9  81.1  91.4   Hematocrit 39.0 - 52.0 % 40.5  51.9  42.9   Platelets 150 - 400 K/uL 185  223  250.0     Lab Results  Component Value Date/Time   VITAMINB12 452 12/12/2015 05:58 PM    Clinical ASCVD: Yes  The ASCVD Risk score (Arnett DK, et al., 2019) failed to calculate for the following reasons:   The patient has a prior MI or stroke diagnosis        04/17/2022    2:18 PM 04/16/2022   10:05 AM 04/10/2021   11:24 AM  Depression screen PHQ 2/9  Decreased Interest 0 0 0  Down, Depressed, Hopeless 0 0 0  PHQ - 2 Score 0 0 0  Altered sleeping 0  0  Tired, decreased energy 0  0  Change in appetite 0  0  Feeling bad or failure about yourself  0  0  Trouble concentrating 0  0  Moving slowly or fidgety/restless 0  0  Suicidal thoughts 0  0  PHQ-9 Score 0  0  Difficult doing work/chores Not difficult at all  Not difficult at all     Social History   Tobacco Use  Smoking Status Former   Packs/day: 1.00   Years: 3.00   Additional pack years: 0.00   Total pack years: 3.00   Types: Cigars, Cigarettes   Quit date: 11/25/2004   Years since quitting: 18.1  Smokeless Tobacco Never  Tobacco Comments   occas. cigar quit 2006   BP Readings from Last 3 Encounters:  01/12/23 100/70  11/28/22 122/80  10/09/22 (!) 145/83   Pulse Readings from Last 3 Encounters:  01/12/23 (!) 117  11/28/22 (!) 112  10/09/22 90   Wt Readings from Last 3 Encounters:  01/12/23 254 lb 6 oz (115.4 kg)  11/28/22 243 lb (110.2 kg)  10/09/22 246 lb 9 oz (111.8 kg)   BMI Readings from Last 3 Encounters:  01/12/23 35.98 kg/m  11/28/22 34.37 kg/m  10/09/22 34.88 kg/m    Allergies  Allergen Reactions    Oxycodone-Acetaminophen Other (See Comments)    Stops breathing; has tolerated Tylenol before   Oxycodone-Acetaminophen Anaphylaxis    Medications Reviewed Today     Reviewed by Kathyrn Sheriff, [facility] (Pharmacist) on 01/30/23 at 1042  Med List Status: <None>   Medication Order Taking? Sig Documenting Provider Last Dose Status Informant  allopurinol (ZYLOPRIM) 100  MG tablet [id number] Yes TAKE 1 TABLET BY MOUTH TWICE A DAY Tower, Marne A, MD Taking Active Spouse/Significant Other  aspirin EC (ASPIRIN 81) 81 MG tablet [id number] Yes Take 81 mg by mouth daily. Swallow whole. [provider] Taking Active Spouse/Significant Other  benzonatate (TESSALON) 200 MG capsule [id number] Yes Take 1 capsule (200 mg total) by mouth 2 (two) times daily as needed for cough. Excell Seltzer, MD Taking Active   Calcium Carbonate-Vit D-Min (CALCIUM-VITAMIN D-MINERALS) 600-800 MG-UNIT CHEW [id number] Yes Chew 1 tablet by mouth daily. [provider] Taking Active Spouse/Significant Other  colchicine 0.6 MG tablet [id number] Yes Take 1 tablet (0.6 mg total) by mouth as needed (GOUT FLARE). TAKE 1 TABLET BY MOUTH TWICE A DAY WITH A MEAL AS NEEDED FOR ACUTE FLARE OF GOUT Lurene Shadow, MD Taking Active   Continuous Blood Gluc Receiver (FREESTYLE LIBRE 2 READER) DEVI [id number] Yes USE AS INSTRUCTED TO CHECK BLOOD SUGARS. Romero Belling, MD Taking Active Spouse/Significant Other  Continuous Blood Gluc Sensor (FREESTYLE LIBRE 2 SENSOR) [place] [id number] Yes 1 Device by Does not apply route every 14 (fourteen) days. Carlus Pavlov, MD Taking Active   ezetimibe (ZETIA) 10 MG tablet [id number] Yes TAKE 1 TABLET BY MOUTH EVERY DAY Tower, Audrie Gallus, MD Taking Active Spouse/Significant Other  famotidine (PEPCID) 20 MG tablet [id number] Yes TAKE 1 TABLET BY MOUTH EVERYDAY AT BEDTIME Nyoka Cowden, MD Taking Active   furosemide (LASIX) 20 MG tablet [id number] Yes TAKE 1 TABLET BY MOUTH EVERY DAY Gollan, Tollie Pizza, MD  Taking Active Spouse/Significant Other  gabapentin (NEURONTIN) 300 MG capsule [id number] Yes Take 1 capsule (300 mg total) by mouth 3 (three) times daily.  Patient taking differently: Take 300 mg by mouth at bedtime.   Nyoka Cowden, MD Taking Active   Glucosamine HCl (GLUCOSAMINE PO) [id number] Yes Take 1,500 mg by mouth 2 (two) times daily.  [provider] Taking Active Spouse/Significant Other  glucose blood (FREESTYLE LITE) test strip [id number] Yes 1 each by Other route 4 (four) times daily. E11.65 Romero Belling, MD Taking Active Spouse/Significant Other  insulin isophane & regular human KwikPen (NOVOLIN 70/30 KWIKPEN) (70-30) 100 UNIT/ML KwikPen [id number] Yes Inject 45 Units into the skin daily with breakfast. And pen needles 1/day Carlus Pavlov, MD Taking Active   insulin lispro (HUMALOG KWIKPEN) 200 UNIT/ML KwikPen [id number] Yes Inject 20 Units into the skin 2 (two) times daily before a meal. INJECT 12-15 UNITS INTO THE SKIN 2 (TWO) TIMES DAILY BEFORE A MEAL. Carlus Pavlov, MD Taking Active   Insulin Pen Needle (BD PEN NEEDLE NANO 2ND GEN) 32G X 4 MM MISC [id number] Yes USE 3 TIMES A Sammuel Bailiff, MD Taking Active   ketoconazole (NIZORAL) 2 % cream [id number] Yes Apply 1 Application topically 2 (two) times daily. [provider] Taking Active   Lancets (FREESTYLE) lancets [id number] Yes 1 EACH BY OTHER ROUTE 4 (FOUR) TIMES DAILY. E11.65 Romero Belling, MD Taking Active Spouse/Significant Other  Lutein 20 MG CAPS [id number] Yes Take 20 mg by mouth daily.  [provider] Taking Active Spouse/Significant Other  meclizine (ANTIVERT) 12.5 MG tablet [id number] Yes TAKE 1-2 TABLETS BY MOUTH THREE TIMES A DAY AS NEEDED FOR DIZZINESS [provider] Taking Active   metoprolol succinate (TOPROL-XL) 25 MG 24 hr tablet [id number] Yes TAKE 0.5 TABLETS BY MOUTH AT BEDTIME. Antonieta Iba, MD Taking Active   nitroGLYCERIN (NITROSTAT) 0.4 MG SL tablet [id number]  Yes Place 1 tablet (0.4 mg total) under the tongue  every 5 (five) minutes x 3 doses as needed for chest pain. Antonieta Iba, MD Taking Active   omeprazole (PRILOSEC) 40 MG capsule [id number] Yes TAKE 1 CAPUSLE BY MOUTH 30- 60 MIN BEFORE YOUR FIRST AND LAST MEALS OF THE DAY Nyoka Cowden, MD Taking Active   ondansetron (ZOFRAN) 8 MG tablet [id number] Yes Take 1 tablet (8 mg total) by mouth every 8 (eight) hours as needed for nausea or vomiting. From narcotic pain medicine Tower, Audrie Gallus, MD Taking Active   polyethylene glycol (MIRALAX / GLYCOLAX) 17 g packet [id number] Yes Take 17 g by mouth daily. [provider] Taking Active Spouse/Significant Other  predniSONE (DELTASONE) 10 MG tablet [id number] Yes TAKE 2 TABLETS BY MOUTH UNTIL BETTER THEN TAPER TO 1/2 TABLET Nyoka Cowden, MD Taking Active   rosuvastatin (CRESTOR) 40 MG tablet [id number] Yes TAKE 1 TABLET BY MOUTH EVERY DAY Tower, Audrie Gallus, MD Taking Active   Semaglutide, 2 MG/DOSE, (OZEMPIC, 2 MG/DOSE,) 8 MG/3ML SOPN [id number] Yes Inject 2 mg into the skin once a week. Carlus Pavlov, MD Taking Active   Med List Note Maple Hudson, Rennis Chris, MD 10/05/11 1705): CPAP 11/ Christoper Allegra            SDOH:  (Social Determinants of Health) assessments and interventions performed: No SDOH Interventions    Flowsheet Row Telephone from 08/20/2022 in [facility] Care Coordination Clinical Support from 04/17/2022 in [facility] Clinical Support from 04/10/2021 in [facility] Clinical Support from 04/09/2020 in [facility] [place]  SDOH Interventions      Food Insecurity Interventions Intervention Not Indicated Intervention Not Indicated -- --  Housing Interventions Intervention Not Indicated Intervention Not Indicated -- --  Transportation Interventions Intervention Not Indicated Intervention Not Indicated -- --  Depression  Interventions/Treatment  -- -- ZDG6-4 Score <4 Follow-up Not Indicated PHQ2-9 Score <4 Follow-up Not Indicated  Financial Strain Interventions -- Intervention Not Indicated -- --  Physical Activity Interventions -- Intervention Not Indicated -- --  Stress Interventions -- Intervention Not Indicated -- --  Social Connections Interventions -- Intervention Not Indicated -- --       Medication Assistance: None required.  Patient affirms current coverage meets needs.  Medication Access: Within the past 30 days, how often has patient missed a dose of medication? 0 Is a pillbox or other method used to improve adherence? Yes  Factors that may affect medication adherence? no barriers identified Are meds synced by current pharmacy? No  Are meds delivered by current pharmacy? No  Does patient experience delays in picking up medications due to transportation concerns? No   [facility] Reviewed: Is patient disadvantaged to use [facility]?: Yes  Current Rx insurance plan: HTA Name and location of Current pharmacy:  [facility] [phone] [place], [place] [address] [place] [address] Phone: [phone] Fax: [phone]  [facility] services reviewed with patient today?: No  Patient requests to transfer care to [facility]?: No  Reason patient declined to change pharmacies: Disadvantaged due to insurance/mail order  Compliance/Adherence/Medication fill history: Care Gaps: None  Star-Rating Drugs: Rosuvastatin - PDC 100%   Assessment/Plan  Dizziness/Falls  -Pt reports dizziness initially improved after changing gabapentin from 300 mg TID to 300 mg HS, but over the last few days dizziness has returned; typically occurs when standing; he is not checking BP -He has seen cardiology and had tachycardia, Holter monitor and  ECHO ordered Recommendations: -Advised to use meclizine PRN for dizziness/vertigo -Advised to follow up with  cardiology for Holter/ECHO results; it is possible cardiac issues are contributing to dizziness  Hypertension / Heart Failure (BP goal <130/80) -Controlled - pt reports he has had no further falls/orthostatic events since switching metoprolol tartrate to succinate last month; he denies elevated HR/palpitations -Last ejection fraction: 60-65% (Date: 09/2021) -HF type: Diastolic (Grade I dysfunction) -Pt weights himself daily, reports dry weight around ~235# -Current treatment: Furosemide 20 mg daily - Appropriate, Effective, Safe, Accessible Metoprolol succinate 25 mg - 1/2 tab daily -Appropriate, Effective, Safe, Accessible -Medications previously tried: isosorbide, Jardiance -Educated on BP goals and benefits of medications for prevention of heart attack, stroke and kidney damage; Symptoms of hypotension and importance of maintaining adequate hydration; -Educated on Importance of weighing daily; if you gain more than 3 pounds in one day or 5 pounds in one week, take whole tablet of furosemide -Counseled to monitor BP/HR at home daily -Recommend to continue current medication   Hyperlipidemia: (LDL goal < 70) -Controlled - LDL 78 (04/2022), TRIG 325 -Hx CAD; CABG 1987, cardiac cath 09/2021 - diffuse disease, not a candidate for intervention -Current treatment: Rosuvastatin 40 mg daily - Appropriate, Effective, Safe, Accessible Ezetimibe 10 mg daily -Appropriate, Effective, Safe, Accessible Aspirin 81 mg daily -Appropriate, Effective, Safe, Accessible Nitroglycerin 0.4 mg SL prn -Appropriate, Effective, Safe, Accessible -Medications previously tried: n/a  -Current exercise habits: limited -Educated on Cholesterol goals; Benefits of statin for ASCVD risk reduction; -Recommended to continue current medication   Diabetes (A1c goal <7%) -Not ideally controlled - A1c 7.9% (08/2022) above goal (while off of Ozempic in s/o supply issues); he is now back on Ozempic; he is wearing CGM and denies  significant hypoglycemic events -Current medications: Novolin 70/30 - 45 units daily AM -Appropriate, Effective, Safe, Accessible Humalog 12-15 units BID Ozempic 2 mg weekly - Freestyle Libre 2 -Appropriate, Effective, Safe, Accessible -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 -Denies hypoglycemic/hyperglycemic symptoms -Educated on A1c and blood sugar goals; Prevention and management of hypoglycemic episodes; Continuous glucose monitoring - discussed overnight low glucose in absence of symptoms may not be true hypoglycemia, always double check with finger stick -Recommended to continue current medication   GERD (Goal: minimize symptoms of reflux ) -Controlled -Current treatment  Famotidine 20 mg daily HS - Appropriate, Effective, Safe, Accessible Omeprazole 40 mg daily BID -Appropriate, Effective, Safe, Accessible -Medications previously tried: none reported  -Hx of Bleeds/ulcers: No -Counseled on small meals, elevating head, and sleeping on left side -Recommended to continue current medication   Gout (Goal: Prevent gout flares) -Controlled -Last Gout Flare: years ago - prior to allopurinol -Current treatment  Allopurinol 100 mg BID - Appropriate, Effective, Safe, Accessible Colchicine 0.6 mg PRN -Appropriate, Effective, Safe, Accessible -Medications previously tried: n/a  -We discussed:  Counseled patient on low purine diet plan. Counseled patient to reduce consumption of high-fructose corn syrup, sweetened soft drinks, fruit juices, meat, and seafood. -Recommended to continue current medication   Chronic Kidney Disease Stage 4  -All medications assessed for renal dosing and appropriateness in chronic kidney disease. -Follows with Nephrology (Dr Thedore Mins); now off SGLT2-I and ARB -Recommended to continue current medication   BOOP (Goal: manage symptoms) -Follows with pulmonology. No indication for O2 as of 05/2022 walk test -Hx OSA, on CPAP -Current treatment  Prednisone 10 mg -2  tab daily - Appropriate, Effective, Safe, Accessible Gabapentin 300 mg HS - Appropriate, Effective, Query Safe Benzonatate 200 mg -Appropriate, Effective, Safe, Accessible -  Medications previously tried: gabapentin  -Recommended to continue current medication   Health Maintenance -OTC: Calcium-Vitamin D, glucosamine, lutein -Vertigo: meclizine  Al Corpus, PharmD, Patsy [place], CPP Clinical Pharmacist Practitioner [facility] [phone]

## 2023-02-01 ENCOUNTER — Other Ambulatory Visit: Payer: Self-pay | Admitting: Internal Medicine

## 2023-02-01 DIAGNOSIS — R918 Other nonspecific abnormal finding of lung field: Secondary | ICD-10-CM

## 2023-02-03 NOTE — Telephone Encounter (Signed)
Should be  refill remaining

## 2023-02-05 ENCOUNTER — Ambulatory Visit: Payer: PPO | Attending: Cardiovascular Disease

## 2023-02-05 DIAGNOSIS — R000 Tachycardia, unspecified: Secondary | ICD-10-CM

## 2023-02-05 LAB — ECHOCARDIOGRAM COMPLETE
AR max vel: 1.41 cm2
AV Area VTI: 1.27 cm2
AV Area mean vel: 1.24 cm2
AV Mean grad: 12 mmHg
AV Peak grad: 19.9 mmHg
Ao pk vel: 2.23 m/s
Area-P 1/2: 3.11 cm2
MV M vel: 5.07 m/s
MV Peak grad: 102.8 mmHg
MV VTI: 1.71 cm2

## 2023-02-05 MED ORDER — PERFLUTREN LIPID MICROSPHERE
1.0000 mL | INTRAVENOUS | Status: AC | PRN
Start: 2023-02-05 — End: 2023-02-05
  Administered 2023-02-05: 4 mL via INTRAVENOUS

## 2023-02-09 DIAGNOSIS — M5416 Radiculopathy, lumbar region: Secondary | ICD-10-CM | POA: Diagnosis not present

## 2023-02-09 DIAGNOSIS — M6281 Muscle weakness (generalized): Secondary | ICD-10-CM | POA: Diagnosis not present

## 2023-02-09 DIAGNOSIS — M549 Dorsalgia, unspecified: Secondary | ICD-10-CM | POA: Diagnosis not present

## 2023-02-19 ENCOUNTER — Encounter: Payer: Self-pay | Admitting: Medical

## 2023-02-19 ENCOUNTER — Other Ambulatory Visit
Admission: RE | Admit: 2023-02-19 | Discharge: 2023-02-19 | Disposition: A | Discharge status: Home / Self Care | Payer: PPO | Source: Ambulatory Visit | Attending: Medical | Admitting: Medical

## 2023-02-19 ENCOUNTER — Ambulatory Visit: Payer: PPO | Attending: Medical | Admitting: Medical

## 2023-02-19 VITALS — BP 110/74 | HR 91 | Ht 70.0 in | Wt 253.0 lb

## 2023-02-19 DIAGNOSIS — E785 Hyperlipidemia, unspecified: Secondary | ICD-10-CM

## 2023-02-19 DIAGNOSIS — E1169 Type 2 diabetes mellitus with other specified complication: Secondary | ICD-10-CM | POA: Diagnosis not present

## 2023-02-19 DIAGNOSIS — I10 Essential (primary) hypertension: Secondary | ICD-10-CM | POA: Diagnosis not present

## 2023-02-19 DIAGNOSIS — R55 Syncope and collapse: Secondary | ICD-10-CM

## 2023-02-19 DIAGNOSIS — I2510 Atherosclerotic heart disease of native coronary artery without angina pectoris: Secondary | ICD-10-CM | POA: Diagnosis not present

## 2023-02-19 DIAGNOSIS — I951 Orthostatic hypotension: Secondary | ICD-10-CM

## 2023-02-19 LAB — CBC
HCT: 39.5 % (ref 39.0–52.0)
Hemoglobin: 12.6 g/dL — ABNORMAL LOW (ref 13.0–17.0)
MCH: 32.5 pg (ref 26.0–34.0)
MCHC: 31.9 g/dL (ref 30.0–36.0)
MCV: 101.8 fL — ABNORMAL HIGH (ref 80.0–100.0)
Platelets: 210 10*3/uL (ref 150–400)
RBC: 3.88 MIL/uL — ABNORMAL LOW (ref 4.22–5.81)
RDW: 15.2 % (ref 11.5–15.5)
WBC: 9.6 10*3/uL (ref 4.0–10.5)
nRBC: 0.6 % — ABNORMAL HIGH (ref 0.0–0.2)

## 2023-02-19 LAB — TSH: TSH: 1.327 u[IU]/mL (ref 0.350–4.500)

## 2023-02-19 NOTE — Progress Notes (Signed)
Cardiology Office Note:    Date:  02/19/2023   ID:  Kurt Aguilar, DOB 19-Jan-1946, MRN [id number]  PCP:  Kurt Pimple, MD  [facility] Cardiologist:  Kurt Nordmann, MD  [facility] Electrophysiologist:  None   Referring MD: Kurt Pimple, MD   Chief Complaint: 1 month follow-up  History of Present Illness:    Kurt Aguilar is a 77 y.o. male with a hx of CAD s/p CABG in 1997 in [place], HTN, DM2, CKD, HLD, gout, aortic valve disease who presents for 1 month follow-up.   Patient was admitted to the hospital in January 2023 for non-STEMI.  Cardiac cath showed severe diffuse calcified coronary disease in the native arteries, diffusely diseased SVG to RCA, ostial to proximal 70% stenosis, patent sequential graft to first and second obtuse marginal, patent SVG to first diagonal, patent LIMA to LAD, heavily calcified aortic valve but without significant gradient.  No intervention was performed.  Enzyme elevation likely related to left coronary native disease in the septal perforator territory.  Echo at that time showed LVEF 60 to 65%, grade 1 diastolic dysfunction, elevated LVEDP, moderate reduced RV SF.  Patient was seen in May 2024 reporting syncope.  Echocardiogram and heart monitor were ordered.  Heart monitor in May 2024 showed normal sinus rhythm, 2 runs of SVT, rare ectopy.  No triggered events reported.  Echo showed normal pump function, mild LVH, grade 1 diastolic dysfunction, moderately dilated left atrium, mild MR, mild AS, mean gradient 12 mmHg.  Today, the patient reports persistent syncope. He stands up and feels dizzy and wants to faint. Legs and arms shake. He is trying to lose weight. He is on steroids and has gained 14lbs. He stays hydrated. EKG today showed NSR with a heart rate of 91bpm.   Past Medical History:  Diagnosis Date   Arthritis    Chronic kidney disease    Complication of anesthesia    constipation and inability to  urinate happened a few days after  cataract surgery    Diabetes (HCC)    type 2    Diverticulitis    GERD (gastroesophageal reflux disease)    Gout    Heart attack (HCC)    Heart disease    Heart murmur    Hyperglycemia    Hyperlipidemia    Hypertension    Osteoporosis    Pneumonia    hx of BOOP followed by Dr Kurt Aguilar    Sleep apnea    cpapp - setting at 17     Past Surgical History:  Procedure Laterality Date   ACROMIO-CLAVICULAR JOINT REPAIR Left 05/05/2019   Procedure: SHOULDER ARTHROSCOPIC ASSISTED ACROMIO-CLAVICULAR JOINT REPAIR;  Surgeon: Kurt Broom, MD;  Location: [facility];  Service: Orthopedics;  Laterality: Left;   CATARACT EXTRACTION Bilateral    CORONARY ARTERY BYPASS GRAFT     [place]   LEFT HEART CATH AND CORS/GRAFTS ANGIOGRAPHY N/A 09/19/2021   Procedure: LEFT HEART CATH AND CORS/GRAFTS ANGIOGRAPHY;  Surgeon: Kurt Records, MD;  Location: [facility];  Service: Cardiovascular;  Laterality: N/A;   open heart surgery  09/09/1995-1998   5 bypass   REPAIR KNEE LIGAMENT     right   SHOULDER ARTHROSCOPY Left 05/05/2019   Procedure: ARTHROSCOPY SHOULDER;  Surgeon: Kurt Broom, MD;  Location: [facility];  Service: Orthopedics;  Laterality: Left;   TONSILLECTOMY     TOTAL KNEE ARTHROPLASTY Left 05/29/2020   Procedure: LEFT TOTAL KNEE ARTHROPLASTY;  Surgeon: Kurt Corning, MD;  Location: [facility];  Service: Orthopedics;  Laterality: Left;    Current Medications: Current Meds  Medication Sig   allopurinol (ZYLOPRIM) 100 MG tablet TAKE 1 TABLET BY MOUTH TWICE A DAY   aspirin EC (ASPIRIN 81) 81 MG tablet Take 81 mg by mouth daily. Swallow whole.   benzonatate (TESSALON) 200 MG capsule Take 1 capsule (200 mg total) by mouth 2 (two) times daily as needed for cough.   Calcium Carbonate-Vit D-Min (CALCIUM-VITAMIN D-MINERALS) 600-800 MG-UNIT CHEW Chew 1 tablet by mouth daily.   colchicine 0.6 MG tablet Take 1 tablet (0.6 mg total) by mouth as needed (GOUT FLARE). TAKE 1 TABLET BY MOUTH TWICE A  DAY WITH A MEAL AS NEEDED FOR ACUTE FLARE OF GOUT   Continuous Blood Gluc Receiver (FREESTYLE LIBRE 2 READER) DEVI USE AS INSTRUCTED TO CHECK BLOOD SUGARS.   Continuous Blood Gluc Sensor (FREESTYLE LIBRE 2 SENSOR) MISC 1 Device by Does not apply route every 14 (fourteen) days.   ezetimibe (ZETIA) 10 MG tablet TAKE 1 TABLET BY MOUTH EVERY DAY   famotidine (PEPCID) 20 MG tablet TAKE 1 TABLET BY MOUTH EVERYDAY AT BEDTIME   furosemide (LASIX) 20 MG tablet TAKE 1 TABLET BY MOUTH EVERY DAY   gabapentin (NEURONTIN) 300 MG capsule Take 1 capsule (300 mg total) by mouth 3 (three) times daily. (Patient taking differently: Take 300 mg by mouth at bedtime.)   Glucosamine HCl (GLUCOSAMINE PO) Take 1,500 mg by mouth 2 (two) times daily.    glucose blood (FREESTYLE LITE) test strip 1 each by Other route 4 (four) times daily. E11.65   insulin isophane & regular human KwikPen (NOVOLIN 70/30 KWIKPEN) (70-30) 100 UNIT/ML KwikPen Inject 45 Units into the skin daily with breakfast. And pen needles 1/day (Patient taking differently: Inject 35 Units into the skin daily with breakfast. And pen needles 1/day)   insulin lispro (HUMALOG KWIKPEN) 200 UNIT/ML KwikPen Inject 20 Units into the skin 2 (two) times daily before a meal. INJECT 12-15 UNITS INTO THE SKIN 2 (TWO) TIMES DAILY BEFORE A MEAL. (Patient taking differently: Inject 15 Units into the skin 2 (two) times daily before a meal. INJECT 12-15 UNITS INTO THE SKIN 2 (TWO) TIMES DAILY BEFORE A MEAL.)   Insulin Pen Needle (BD PEN NEEDLE NANO 2ND GEN) 32G X 4 MM MISC USE 3 TIMES A DAY   ketoconazole (NIZORAL) 2 % cream Apply 1 Application topically 2 (two) times daily.   Lancets (FREESTYLE) lancets 1 EACH BY OTHER ROUTE 4 (FOUR) TIMES DAILY. E11.65   Lutein 20 MG CAPS Take 20 mg by mouth daily.    meclizine (ANTIVERT) 12.5 MG tablet TAKE 1-2 TABLETS BY MOUTH THREE TIMES A DAY AS NEEDED FOR DIZZINESS   nitroGLYCERIN (NITROSTAT) 0.4 MG SL tablet Place 1 tablet (0.4 mg total)  under the tongue every 5 (five) minutes x 3 doses as needed for chest pain.   omeprazole (PRILOSEC) 40 MG capsule TAKE 1 CAPUSLE BY MOUTH 30- 60 MIN BEFORE YOUR FIRST AND LAST MEALS OF THE DAY   ondansetron (ZOFRAN) 8 MG tablet Take 1 tablet (8 mg total) by mouth every 8 (eight) hours as needed for nausea or vomiting. From narcotic pain medicine   polyethylene glycol (MIRALAX / GLYCOLAX) 17 g packet Take 17 g by mouth daily.   predniSONE (DELTASONE) 10 MG tablet TAKE 2 TABLETS BY MOUTH UNTIL BETTER THEN TAPER TO 1/2 TABLET (Patient taking differently: Take 5 mg by mouth daily with breakfast.)   rosuvastatin (CRESTOR) 40 MG  tablet TAKE 1 TABLET BY MOUTH EVERY DAY   Semaglutide, 2 MG/DOSE, (OZEMPIC, 2 MG/DOSE,) 8 MG/3ML SOPN Inject 2 mg into the skin once a week.   [DISCONTINUED] metoprolol succinate (TOPROL-XL) 25 MG 24 hr tablet TAKE 0.5 TABLETS BY MOUTH AT BEDTIME.     Allergies:   Oxycodone-acetaminophen and Oxycodone-acetaminophen   Social History   Socioeconomic History   Marital status: Married    Spouse name: Not on file   Number of children: 4   Years of education: 14   Highest education level: Not on file  Occupational History   Occupation: Engineer, petroleum: RETIRED  Tobacco Use   Smoking status: Former    Packs/day: 1.00    Years: 3.00    Additional pack years: 0.00    Total pack years: 3.00    Types: Cigars, Cigarettes    Quit date: 11/25/2004    Years since quitting: 18.2   Smokeless tobacco: Never   Tobacco comments:    occas. cigar quit 2006  Vaping Use   Vaping Use: Never used  Substance and Sexual Activity   Alcohol use: Yes    Alcohol/week: 1.0 standard drink of alcohol    Types: 1 Standard drinks or equivalent per week    Comment: rare   Drug use: No   Sexual activity: Never  Other Topics Concern   Not on file  Social History Narrative   Not on file   Social Determinants of Health   Financial Resource Strain: Low Risk  (04/17/2022)    Overall Financial Resource Strain (CARDIA)    Difficulty of Paying Living Expenses: Not hard at all  Food Insecurity: No Food Insecurity (08/20/2022)   Hunger Vital Sign    Worried About Running Out of Food in the Last Year: Never true    Ran Out of Food in the Last Year: Never true  Transportation Needs: No Transportation Needs (08/20/2022)   PRAPARE - Administrator, Civil Service (Medical): No    Lack of Transportation (Non-Medical): No  Physical Activity: Insufficiently Active (04/17/2022)   Exercise Vital Sign    Days of Exercise per Week: 5 days    Minutes of Exercise per Session: 20 min  Stress: No Stress Concern Present (04/17/2022)   [employer] of Occupational Health - Occupational Stress Questionnaire    Feeling of Stress : Only a little  Social Connections: Moderately Integrated (04/17/2022)   Social Connection and Isolation Panel [NHANES]    Frequency of Communication with Friends and Family: More than three times a week    Frequency of Social Gatherings with Friends and Family: Once a week    Attends Religious Services: More than 4 times per year    Active Member of [employer] or Organizations: No    Attends Banker Meetings: Never    Marital Status: Married     Family History: The patient's family history includes Heart attack (age of onset: 46) in his son; Heart disease in his father and mother; Stroke in his father. There is no history of Kidney disease, Prostate cancer, or Diabetes.  ROS:   Please see the history of present illness.     All other systems reviewed and are negative.  EKGs/Labs/Other Studies Reviewed:    The following studies were reviewed today:  Echo 01/2023 1. Left ventricular ejection fraction, by estimation, is 60 to 65%. The  left ventricle has normal function. The left ventricle has no regional  wall motion abnormalities.  There is mild left ventricular hypertrophy.  Left ventricular diastolic parameters  are  consistent with Grade I diastolic dysfunction (impaired relaxation).   2. Right ventricular systolic function is normal. The right ventricular  size is normal.   3. Left atrial size was moderately dilated.   4. The mitral valve is degenerative. Mild mitral valve regurgitation.  Moderate mitral stenosis. The mean mitral valve gradient is 8.0 mmHg.  Moderate mitral annular calcification.   5. The aortic valve is calcified. Aortic valve regurgitation is not  visualized. Mild aortic valve stenosis. Aortic valve mean gradient  measures 12.0 mmHg.   Comparison(s): EF 60%, normal MV, calcified AOV with no stenosis.   Cardiac cath 09/2021 CONCLUSIONS: Severe diffuse calcified coronary disease of the native arteries with 100% proximal RCA, 90% ostial circumflex, 99% distal left main, severely and diffusely diseased proximal to mid LAD with threatened septal perforator. Diffusely diseased saphenous vein graft to the right coronary.  Ostial to proximal 70% stenosis. Widely patent sequential graft to first and second obtuse marginal Widely patent saphenous vein graft to the first diagonal Widely patent LIMA to the mid LAD Aortic valve is heavily calcified, was difficult to cross, but without significant gradient.  LVEDP 10 mmHg. Total contrast 70 cc   RECOMMENDATIONS:   Continue IV fluids for 18 to 24 hours.  He was given an additional 250 cc of saline as a bolus after identifying that LVEDP was 9 mmHg. Monitor kidney function closely.  Contrast exposure was 70 cc. The saphenous vein graft to the right coronary can be stented although it is not so critical that it should be causing enzyme elevation.  Enzyme elevation likely related to left coronary native disease in the septal perforator territory.  Echo 09/2021  1. Left ventricular ejection fraction, by estimation, is 60 to 65%. The  left ventricle has normal function. The left ventricle has no regional  wall motion abnormalities. Left ventricular  diastolic parameters are  consistent with Grade I diastolic  dysfunction (impaired relaxation). Elevated left ventricular end-diastolic  pressure.   2. Right ventricular systolic function is moderately reduced. The right  ventricular size is normal.   3. The mitral valve is normal in structure. No evidence of mitral valve  regurgitation. No evidence of mitral stenosis.   4. The AV is poorly visualized to comment on structure. There does appear  to be come calcification of the valve. . The aortic valve was not well  visualized. Aortic valve regurgitation is not visualized. No aortic  stenosis is present.    EKG:  EKG is ordered today.  The ekg ordered today demonstrates NSR, 91bpm, LAD, RBBB, inferior q waves  Recent Labs: 04/09/2022: TSH 2.25 08/17/2022: B Natriuretic Peptide 205.2 08/18/2022: ALT 18; Hemoglobin 13.1; Platelets 185 08/25/2022: BUN 40; Creatinine, Ser 2.62; Potassium 4.6; Sodium 140  Recent Lipid Panel    Component Value Date/Time   CHOL 169 04/09/2022 0811   TRIG 325.0 (H) 04/09/2022 0811   HDL 51.80 04/09/2022 0811   CHOLHDL 3 04/09/2022 0811   VLDL 65.0 (H) 04/09/2022 0811   LDLCALC 33 02/14/2022 0950   LDLDIRECT 78.0 04/09/2022 0811     Physical Exam:    VS:  BP 110/74 (BP Location: Left Arm, Patient Position: Sitting, Cuff Size: Normal)   Pulse 91   Ht 5\' 10"  (1.778 m)   Wt 253 lb (114.8 kg)   SpO2 98%   BMI 36.30 kg/m     Wt Readings from Last 3 Encounters:  02/19/23  253 lb (114.8 kg)  01/12/23 254 lb 6 oz (115.4 kg)  11/28/22 243 lb (110.2 kg)     GEN:  Well nourished, well developed in no acute distress HEENT: Normal NECK: No JVD; No carotid bruits LYMPHATICS: No lymphadenopathy CARDIAC: RRR, no murmurs, rubs, gallops RESPIRATORY:  Clear to auscultation without rales, wheezing or rhonchi  ABDOMEN: Soft, non-tender, non-distended MUSCULOSKELETAL:  No edema; No deformity  SKIN: Warm and dry NEUROLOGIC:  Alert and oriented x  3 PSYCHIATRIC:  Normal affect   ASSESSMENT:    1. Syncope and collapse   2. Pre-syncope   3. Orthostasis   4. Coronary artery disease involving native coronary artery of native heart without angina pectoris   5. Hyperlipidemia associated with type 2 diabetes mellitus (HCC)   6. Morbid obesity due to excess calories (HCC)    PLAN:    In order of problems listed above:  Syncope/pre-syncope Patient reports persistent pre-syncope, which sounds orthostatic in nature. He takes Toprol 25mg  daily and lasix 20mg  daily. Orthostatics today negative. Recent echo showed LVEF 60-65%, no WMA, G1DD, moderately dilated LA, mild MR AS. Heart monitor showed NSR, average heart rate of 100bpm, 2 brief runs of SVT, rare ectopy. I will check a TSH and CBC. No carotid bruits heard, but I will check a carotid US for completeness. I will stop Toprol to see if symptoms improve. Recommended thigh high stckings and abdominal binder. He is not driving.   Sinus tachycardia Resolved on EKG today. EKG shows NSR 91bpm.  CAD s/p remote CABG No chest pain reported. No further ischemic work-up at this time. Continue Aspirin, Crestor, Zetia, SL NTG. Stop Toprol as above.   Hypercholesterolemia In 2023 TG 325, HDL 51, LDL 78. Continue Zetia and Crestor.  HTN BP is soft, stop Toprol as above.   DM2 Most recent A1C 7.9. Management per PCP.  Morbid obesity Patient is trying to lose weight, but has been on steroids and weight has been going up.  Disposition: Follow up in 2 month(s) with MD/APP    Signed, Sofi Bryars David Stall, PA-C  02/19/2023 4:12 PM    [facility]

## 2023-02-19 NOTE — Patient Instructions (Signed)
Medication Instructions:  Your physician recommends the following medication changes.  STOP TAKING: Metoprolol 12.5 mg daily  *If you need a refill on your cardiac medications before your next appointment, please call your pharmacy*   Lab Work: Your provider would like for you to have following labs drawn: (TSH, CBC).   Please go to the [facility] entrance and check in at the front desk.  You do not need an appointment.  They are open from 7am-6 pm.     Testing/Procedures: Your physician has requested that you have a carotid duplex. This test is an ultrasound of the carotid arteries in your neck. It looks at blood flow through these arteries that supply the brain with blood.   Allow one hour for this exam.  There are no restrictions or special instructions.  This will take place at [address] [address], [place] [address]    Follow-Up: At [facility] you and your health needs are our priority.  As part of our continuing mission to provide you with exceptional heart care, we have created designated Provider Care Teams.  These Care Teams include your primary Cardiologist (physician) and Advanced Practice Providers (APPs -  Physician Assistants and Nurse Practitioners) who all work together to provide you with the care you need, when you need it.  We recommend signing up for the patient portal called "MyChart".  Sign up information is provided on this After Visit Summary.  MyChart is used to connect with patients for Virtual Visits (Telemedicine).  Patients are able to view lab/test results, encounter notes, upcoming appointments, etc.  Non-urgent messages can be sent to your provider as well.   To learn more about what you can do with MyChart, go to [email or web address].    Your next appointment:   2 month(s)  Provider:   You may see Julien Nordmann, MD or one of the following Advanced Practice Providers on your designated Care Team:    Nicolasa Ducking, NP Eula Listen, PA-C Cadence Fransico Michael, PA-C Charlsie Quest, NP

## 2023-03-10 ENCOUNTER — Ambulatory Visit: Payer: PPO | Attending: Medical

## 2023-03-10 DIAGNOSIS — R55 Syncope and collapse: Secondary | ICD-10-CM

## 2023-03-16 ENCOUNTER — Other Ambulatory Visit: Payer: Self-pay

## 2023-03-16 ENCOUNTER — Telehealth: Payer: Self-pay

## 2023-03-16 DIAGNOSIS — M6281 Muscle weakness (generalized): Secondary | ICD-10-CM | POA: Diagnosis not present

## 2023-03-16 DIAGNOSIS — M549 Dorsalgia, unspecified: Secondary | ICD-10-CM | POA: Diagnosis not present

## 2023-03-16 DIAGNOSIS — M5416 Radiculopathy, lumbar region: Secondary | ICD-10-CM | POA: Diagnosis not present

## 2023-03-16 NOTE — Telephone Encounter (Signed)
It would be ideal to restart, can he get it from the patient assistance?  If not, we can reevaluate when he comes back in August.

## 2023-03-16 NOTE — Telephone Encounter (Signed)
Patient and his wife called stating that he went to get his refill on Ozempic and its going to cost them [phone] (roughly). Pt would like to know can he get something else or does he even need to take this.

## 2023-03-16 NOTE — Telephone Encounter (Signed)
Pt would like to apply for the PAP. Application has been placed in the mail.

## 2023-03-30 DIAGNOSIS — N189 Chronic kidney disease, unspecified: Secondary | ICD-10-CM | POA: Diagnosis not present

## 2023-03-30 DIAGNOSIS — I10 Essential (primary) hypertension: Secondary | ICD-10-CM | POA: Diagnosis not present

## 2023-03-30 DIAGNOSIS — N184 Chronic kidney disease, stage 4 (severe): Secondary | ICD-10-CM | POA: Diagnosis not present

## 2023-03-30 DIAGNOSIS — E1122 Type 2 diabetes mellitus with diabetic chronic kidney disease: Secondary | ICD-10-CM | POA: Diagnosis not present

## 2023-03-30 DIAGNOSIS — Q612 Polycystic kidney, adult type: Secondary | ICD-10-CM | POA: Diagnosis not present

## 2023-04-08 ENCOUNTER — Encounter (INDEPENDENT_AMBULATORY_CARE_PROVIDER_SITE_OTHER): Payer: Self-pay

## 2023-04-09 ENCOUNTER — Other Ambulatory Visit: Payer: Self-pay | Admitting: Internal Medicine

## 2023-04-09 ENCOUNTER — Other Ambulatory Visit: Payer: Self-pay

## 2023-04-13 ENCOUNTER — Other Ambulatory Visit: Payer: Self-pay | Admitting: Family Medicine

## 2023-04-13 DIAGNOSIS — L820 Inflamed seborrheic keratosis: Secondary | ICD-10-CM | POA: Diagnosis not present

## 2023-04-13 DIAGNOSIS — L570 Actinic keratosis: Secondary | ICD-10-CM | POA: Diagnosis not present

## 2023-04-13 DIAGNOSIS — L219 Seborrheic dermatitis, unspecified: Secondary | ICD-10-CM | POA: Diagnosis not present

## 2023-04-14 ENCOUNTER — Telehealth: Payer: Self-pay | Admitting: Family Medicine

## 2023-04-14 DIAGNOSIS — E1169 Type 2 diabetes mellitus with other specified complication: Secondary | ICD-10-CM

## 2023-04-14 DIAGNOSIS — I10 Essential (primary) hypertension: Secondary | ICD-10-CM

## 2023-04-14 DIAGNOSIS — M1A00X0 Idiopathic chronic gout, unspecified site, without tophus (tophi): Secondary | ICD-10-CM

## 2023-04-14 DIAGNOSIS — Z125 Encounter for screening for malignant neoplasm of prostate: Secondary | ICD-10-CM

## 2023-04-14 DIAGNOSIS — Z79899 Other long term (current) drug therapy: Secondary | ICD-10-CM

## 2023-04-14 NOTE — Telephone Encounter (Signed)
-----   Message from Lovena Neighbours sent at 03/30/2023  4:05 PM EDT ----- Regarding: Labs for 8.7.24 Pt is on lab schedule for 8.7.24, please put orders in future. Thank you, Denny Peon

## 2023-04-15 ENCOUNTER — Other Ambulatory Visit (INDEPENDENT_AMBULATORY_CARE_PROVIDER_SITE_OTHER): Payer: PPO

## 2023-04-15 DIAGNOSIS — Z125 Encounter for screening for malignant neoplasm of prostate: Secondary | ICD-10-CM

## 2023-04-15 DIAGNOSIS — E1169 Type 2 diabetes mellitus with other specified complication: Secondary | ICD-10-CM

## 2023-04-15 DIAGNOSIS — Z79899 Other long term (current) drug therapy: Secondary | ICD-10-CM

## 2023-04-15 DIAGNOSIS — I10 Essential (primary) hypertension: Secondary | ICD-10-CM | POA: Diagnosis not present

## 2023-04-15 DIAGNOSIS — M1A00X0 Idiopathic chronic gout, unspecified site, without tophus (tophi): Secondary | ICD-10-CM | POA: Diagnosis not present

## 2023-04-15 DIAGNOSIS — E785 Hyperlipidemia, unspecified: Secondary | ICD-10-CM

## 2023-04-15 LAB — PSA, MEDICARE: PSA: 1.38 ng/ml (ref 0.10–4.00)

## 2023-04-15 LAB — COMPREHENSIVE METABOLIC PANEL
ALT: 24 U/L (ref 0–53)
AST: 21 U/L (ref 0–37)
Albumin: 4.3 g/dL (ref 3.5–5.2)
Alkaline Phosphatase: 47 U/L (ref 39–117)
BUN: 40 mg/dL — ABNORMAL HIGH (ref 6–23)
CO2: 25 mEq/L (ref 19–32)
Calcium: 9.3 mg/dL (ref 8.4–10.5)
Chloride: 107 mEq/L (ref 96–112)
Creatinine, Ser: 2.23 mg/dL — ABNORMAL HIGH (ref 0.40–1.50)
GFR: 27.33 mL/min — ABNORMAL LOW (ref >60.00)
Glucose, Bld: 138 mg/dL — ABNORMAL HIGH (ref 70–99)
Potassium: 3.8 mEq/L (ref 3.5–5.1)
Sodium: 142 mEq/L (ref 135–145)
Total Bilirubin: 0.8 mg/dL (ref 0.2–1.2)
Total Protein: 6.4 g/dL (ref 6.0–8.3)

## 2023-04-15 LAB — LIPID PANEL
Cholesterol: 138 mg/dL (ref 0–200)
HDL: 47.6 mg/dL (ref >39.00)
NonHDL: 90.55
Total CHOL/HDL Ratio: 3
Triglycerides: 320 mg/dL — ABNORMAL HIGH (ref 0.0–149.0)
VLDL: 64 mg/dL — ABNORMAL HIGH (ref 0.0–40.0)

## 2023-04-15 LAB — CBC WITH DIFFERENTIAL/PLATELET
Basophils Absolute: 0 10*3/uL (ref 0.0–0.1)
Basophils Relative: 0.2 % (ref 0.0–3.0)
Eosinophils Absolute: 0.1 10*3/uL (ref 0.0–0.7)
Eosinophils Relative: 0.7 % (ref 0.0–5.0)
HCT: 42.6 % (ref 39.0–52.0)
Hemoglobin: 13.8 g/dL (ref 13.0–17.0)
Lymphocytes Relative: 31.3 % (ref 12.0–46.0)
Lymphs Abs: 3.4 10*3/uL (ref 0.7–4.0)
MCHC: 32.4 g/dL (ref 30.0–36.0)
MCV: 99.7 fl (ref 78.0–100.0)
Monocytes Absolute: 0.8 10*3/uL (ref 0.1–1.0)
Monocytes Relative: 7 % (ref 3.0–12.0)
Neutro Abs: 6.5 10*3/uL (ref 1.4–7.7)
Neutrophils Relative %: 60.8 % (ref 43.0–77.0)
Platelets: 242 10*3/uL (ref 150.0–400.0)
RBC: 4.27 Mil/uL (ref 4.22–5.81)
RDW: 15.9 % — ABNORMAL HIGH (ref 11.5–15.5)
WBC: 10.7 10*3/uL — ABNORMAL HIGH (ref 4.0–10.5)

## 2023-04-15 LAB — LDL CHOLESTEROL, DIRECT: Direct LDL: 63 mg/dL

## 2023-04-15 LAB — TSH: TSH: 3.47 u[IU]/mL (ref 0.35–5.50)

## 2023-04-15 LAB — VITAMIN B12: Vitamin B-12: 194 pg/mL — ABNORMAL LOW (ref 211–911)

## 2023-04-15 LAB — URIC ACID: Uric Acid, Serum: 5.4 mg/dL (ref 4.0–7.8)

## 2023-04-15 LAB — VITAMIN D 25 HYDROXY (VIT D DEFICIENCY, FRACTURES): VITD: 37.12 ng/mL (ref 30.00–100.00)

## 2023-04-17 DIAGNOSIS — D8481 Immunodeficiency due to conditions classified elsewhere: Secondary | ICD-10-CM | POA: Diagnosis not present

## 2023-04-17 DIAGNOSIS — E261 Secondary hyperaldosteronism: Secondary | ICD-10-CM | POA: Diagnosis not present

## 2023-04-17 DIAGNOSIS — J8489 Other specified interstitial pulmonary diseases: Secondary | ICD-10-CM | POA: Diagnosis not present

## 2023-04-17 DIAGNOSIS — I5032 Chronic diastolic (congestive) heart failure: Secondary | ICD-10-CM | POA: Diagnosis not present

## 2023-04-17 DIAGNOSIS — I110 Hypertensive heart disease with heart failure: Secondary | ICD-10-CM | POA: Diagnosis not present

## 2023-04-17 DIAGNOSIS — E1122 Type 2 diabetes mellitus with diabetic chronic kidney disease: Secondary | ICD-10-CM | POA: Diagnosis not present

## 2023-04-17 DIAGNOSIS — E1142 Type 2 diabetes mellitus with diabetic polyneuropathy: Secondary | ICD-10-CM | POA: Diagnosis not present

## 2023-04-17 DIAGNOSIS — E1169 Type 2 diabetes mellitus with other specified complication: Secondary | ICD-10-CM | POA: Diagnosis not present

## 2023-04-17 DIAGNOSIS — N184 Chronic kidney disease, stage 4 (severe): Secondary | ICD-10-CM | POA: Diagnosis not present

## 2023-04-17 DIAGNOSIS — Z794 Long term (current) use of insulin: Secondary | ICD-10-CM | POA: Diagnosis not present

## 2023-04-17 DIAGNOSIS — I25118 Atherosclerotic heart disease of native coronary artery with other forms of angina pectoris: Secondary | ICD-10-CM | POA: Diagnosis not present

## 2023-04-17 DIAGNOSIS — E1165 Type 2 diabetes mellitus with hyperglycemia: Secondary | ICD-10-CM | POA: Diagnosis not present

## 2023-04-22 ENCOUNTER — Ambulatory Visit (INDEPENDENT_AMBULATORY_CARE_PROVIDER_SITE_OTHER): Payer: PPO | Admitting: Family Medicine

## 2023-04-22 ENCOUNTER — Encounter: Payer: Self-pay | Admitting: Family Medicine

## 2023-04-22 VITALS — BP 130/88 | HR 90 | Temp 97.3°F | Ht 70.5 in | Wt 246.0 lb

## 2023-04-22 DIAGNOSIS — D84821 Immunodeficiency due to drugs: Secondary | ICD-10-CM

## 2023-04-22 DIAGNOSIS — F1920 Other psychoactive substance dependence, uncomplicated: Secondary | ICD-10-CM

## 2023-04-22 DIAGNOSIS — E1159 Type 2 diabetes mellitus with other circulatory complications: Secondary | ICD-10-CM | POA: Diagnosis not present

## 2023-04-22 DIAGNOSIS — I10 Essential (primary) hypertension: Secondary | ICD-10-CM

## 2023-04-22 DIAGNOSIS — E785 Hyperlipidemia, unspecified: Secondary | ICD-10-CM

## 2023-04-22 DIAGNOSIS — E538 Deficiency of other specified B group vitamins: Secondary | ICD-10-CM | POA: Insufficient documentation

## 2023-04-22 DIAGNOSIS — Z79899 Other long term (current) drug therapy: Secondary | ICD-10-CM | POA: Diagnosis not present

## 2023-04-22 DIAGNOSIS — Z1211 Encounter for screening for malignant neoplasm of colon: Secondary | ICD-10-CM

## 2023-04-22 DIAGNOSIS — Z7952 Long term (current) use of systemic steroids: Secondary | ICD-10-CM

## 2023-04-22 DIAGNOSIS — Z7985 Long-term (current) use of injectable non-insulin antidiabetic drugs: Secondary | ICD-10-CM

## 2023-04-22 DIAGNOSIS — Z125 Encounter for screening for malignant neoplasm of prostate: Secondary | ICD-10-CM | POA: Diagnosis not present

## 2023-04-22 DIAGNOSIS — E1169 Type 2 diabetes mellitus with other specified complication: Secondary | ICD-10-CM

## 2023-04-22 DIAGNOSIS — K219 Gastro-esophageal reflux disease without esophagitis: Secondary | ICD-10-CM | POA: Diagnosis not present

## 2023-04-22 DIAGNOSIS — Z0000 Encounter for general adult medical examination without abnormal findings: Secondary | ICD-10-CM

## 2023-04-22 DIAGNOSIS — E1165 Type 2 diabetes mellitus with hyperglycemia: Secondary | ICD-10-CM

## 2023-04-22 DIAGNOSIS — I5032 Chronic diastolic (congestive) heart failure: Secondary | ICD-10-CM

## 2023-04-22 DIAGNOSIS — Z0001 Encounter for general adult medical examination with abnormal findings: Secondary | ICD-10-CM

## 2023-04-22 DIAGNOSIS — N138 Other obstructive and reflux uropathy: Secondary | ICD-10-CM

## 2023-04-22 DIAGNOSIS — G4733 Obstructive sleep apnea (adult) (pediatric): Secondary | ICD-10-CM

## 2023-04-22 DIAGNOSIS — N184 Chronic kidney disease, stage 4 (severe): Secondary | ICD-10-CM | POA: Insufficient documentation

## 2023-04-22 DIAGNOSIS — N401 Enlarged prostate with lower urinary tract symptoms: Secondary | ICD-10-CM | POA: Diagnosis not present

## 2023-04-22 DIAGNOSIS — M85859 Other specified disorders of bone density and structure, unspecified thigh: Secondary | ICD-10-CM

## 2023-04-22 DIAGNOSIS — N1832 Chronic kidney disease, stage 3b: Secondary | ICD-10-CM | POA: Diagnosis not present

## 2023-04-22 DIAGNOSIS — M1A00X0 Idiopathic chronic gout, unspecified site, without tophus (tophi): Secondary | ICD-10-CM

## 2023-04-22 MED ORDER — COLCHICINE 0.6 MG PO TABS: 0.6000 mg | ORAL_TABLET | ORAL | 0 refills | Status: DC | PRN

## 2023-04-22 MED ORDER — CYANOCOBALAMIN 1000 MCG/ML IJ SOLN
1000.0000 ug | Freq: Once | INTRAMUSCULAR | Status: AC
Start: 2023-04-22 — End: 2023-04-22
  Administered 2023-04-22: 1000 ug via INTRAMUSCULAR

## 2023-04-22 NOTE — Assessment & Plan Note (Signed)
Continues cardiology care  Lasix 20 mg daily Under cardiology care In setting of CAD Had to stop beta blocker due to syncope

## 2023-04-22 NOTE — Assessment & Plan Note (Signed)
Lab Results  Component Value Date   VITAMINB12 194 (L) 04/15/2023   Low=will supplement   Last vitamin D Lab Results  Component Value Date   VD25OH 37.12 04/15/2023    Continue to watch renal fxn

## 2023-04-22 NOTE — Assessment & Plan Note (Signed)
Lab Results  Component Value Date   PSA 1.38 04/15/2023   PSA 1.25 04/09/2022   PSA 1.18 04/09/2021    No clinical changes Does not desire treatment

## 2023-04-22 NOTE — Assessment & Plan Note (Signed)
Aggressive ppi and pepcid in light of chronic cough and BOOP Trying to loose weight  Also taking prednisone

## 2023-04-22 NOTE — Assessment & Plan Note (Signed)
Reviewed health habits including diet and exercise and skin cancer prevention Reviewed appropriate screening tests for age  Also reviewed health mt list, fam hx and immunization status , as well as social and family history   See HPI Labs reviewed and ordered New finding is vitamin B12 deficiency- shot given and plan made  Plans flu shot in the fall Stable psa  Colonoscopy due for polyps but risks outweigh benefits (pulm and cardiac dz) Dexa ordered-pt will schedule / discussed fall prevention/ no fracture, D level is in normal range Discussed strength building exercise when able PHQ of 0

## 2023-04-22 NOTE — Assessment & Plan Note (Signed)
Colonoscopy 2018  Had 5 y recall but health problems make him too high risk currently  No stool changes  Would skip cologuard due to history of polyps

## 2023-04-22 NOTE — Assessment & Plan Note (Signed)
New Lab Results  Component Value Date   VITAMINB12 194 (L) 04/15/2023   On ppi he cannot wean   B12 shot today  Will plan weekly for 4 weeks Instructed to take oral 1000 mcg daily  Re check 2 mo

## 2023-04-22 NOTE — Assessment & Plan Note (Signed)
Dexa ordered Discussed fall prevention No fractures D level is therapeutic  Encouraged exercise as tolerated

## 2023-04-22 NOTE — Patient Instructions (Addendum)
Stay as active as you can be Get back in pool if you can Add some strength training to your routine, this is important for bone and brain health and can reduce your risk of falls and help your body use insulin properly and regulate weight  Light weights, exercise bands , and internet videos are a good way to start  Yoga (chair or regular), machines , floor exercises or a gym with machines are also good options    B12 shot today  Then one shot weekly for 3 more weeks  Take 1000 mcg of vitamin B12 orally over the counter daily   We will re check level in 2 months   Take care of yourself!   Call and schedule your dexa  You have an order for:  []   2D Mammogram  []   3D Mammogram  [x]   Bone Density     Please call for appointment:   [x]   [facility]  [address] [place] [address]  [phone]  []   [facility])   [address]  [place], [place] [address]  [phone]  []   [facility]      [address] [name], [place]        [phone]         []   [facility]  [address] [place], [place]  [phone]  []  [facility]   [address]   [place], [place] [address]  [phone]  []  [facility]  [address] [place], [place] [address] [phone]    Make sure to wear two piece clothing  No lotions powders or deodorants the day of the appointment Make sure to bring picture ID and insurance card.  Bring list of medications you are currently taking including any supplements.   Schedule your screening mammogram through MyChart!   Select [place] imaging sites can now be scheduled through MyChart.  Log into your MyChart account.  Go to 'Visit' (or 'Appointments' if  on mobile App) --> Schedule an   Appointment  Under 'Select a Reason for Visit' choose the Mammogram  Screening option.  Complete the pre-visit questions  and select the time and place that  best fits your schedule

## 2023-04-22 NOTE — Assessment & Plan Note (Signed)
Continues allopurinol Colchicine prn  Lab Results  Component Value Date   LABURIC 5.4 04/15/2023   No recent flares

## 2023-04-22 NOTE — Assessment & Plan Note (Addendum)
BP: 130/88  Had some hypotension and syncope Off beta blocker Just taking lasix 20 mg  Under care of cardiology

## 2023-04-22 NOTE — Assessment & Plan Note (Signed)
Disc goals for lipids and reasons to control them Rev last labs with pt Rev low sat fat diet in detail  LDL of 33 Continues zetia 10 mg daily  Crestor 40 mg daily   Trig up likely due to elevated bs

## 2023-04-22 NOTE — Assessment & Plan Note (Signed)
Continues nephrology follow up

## 2023-04-22 NOTE — Progress Notes (Signed)
Subjective:    Patient ID: Kurt Aguilar, male    DOB: 09-10-1945, 77 y.o.   MRN: [id number]  HPI  Here for health maintenance exam and to review chronic medical problems   Wt Readings from Last 3 Encounters:  04/22/23 246 lb (111.6 kg)  02/19/23 253 lb (114.8 kg)  01/12/23 254 lb 6 oz (115.4 kg)   34.80 kg/m  Vitals:   04/22/23 0836  BP: 130/88  Pulse: 90  Temp: (!) 97.3 F (36.3 C)  SpO2: 96%    Immunization History  Administered Date(s) Administered   Fluad Quad(high Dose 65+) 06/25/2019, 05/29/2022   Influenza Split 07/09/2011   Influenza Whole 05/14/2010, 06/09/2012   Influenza, High Dose Seasonal PF 05/22/2014, 07/04/2015, 05/16/2016, 07/09/2018   Influenza,inj,Quad PF,6+ Mos 07/11/2013   Influenza-Unspecified 07/03/2017, 06/14/2021   Moderna Sars-Covid-2 Vaccination 11/18/2019, 12/27/2019, 07/24/2020, 04/12/2021, 06/14/2021   Pfizer Covid-19 Vaccine Bivalent Booster 72yrs & up 06/16/2022   Pneumococcal Conjugate-13 08/07/2014   Pneumococcal Polysaccharide-23 09/08/2002, 07/11/2013   Respiratory Syncytial Virus Vaccine,Recomb Aduvanted(Arexvy) 06/16/2022   Td 09/08/2004, 02/12/2016   Zoster Recombinant(Shingrix) 04/06/2018, 06/17/2018   Zoster, Live 07/09/2011    Health Maintenance Due  Topic Date Due   COVID-19 Vaccine (7 - 2023-24 season) 08/11/2022   HEMOGLOBIN A1C  03/07/2023   Diabetic kidney evaluation - Urine ACR  04/17/2023   Medicare Annual Wellness (AWV)  04/18/2023   INFLUENZA VACCINE  04/09/2023   FOOT EXAM  04/17/2023   Doing ok  Had long covid  Now starting to breathe better and get energy back   Doing nutrasystem program for weight loss Lost 13 lb    Flu shot -will get in fall    Prostate health BPH Lab Results  Component Value Date   PSA 1.38 04/15/2023   PSA 1.25 04/09/2022   PSA 1.18 04/09/2021   Nocturia - 1-2 times  Stream is slow - longer to get started   Talks to his nephrologist about that   Colon cancer  screening -colonoscopy 2018 -had polyps  Holding off on another one due to chronic health   Bone health  Dexa 05/2021 with osteopenia at [facility] had several/ syncope -but that is better now (also diagnosed with vertigo)  Cindee Lame  Supplements ca and D  Last vitamin D Lab Results  Component Value Date   VD25OH 37.12 04/15/2023    Exercise : some PT exercises  Did some in the pool   Has walker -uses as needed  On better days uses a cane    Mood    04/22/2023    8:52 AM 04/17/2022    2:18 PM 04/16/2022   10:05 AM 04/10/2021   11:24 AM 04/09/2020    1:31 PM  Depression screen PHQ 2/9  Decreased Interest 0 0 0 0 0  Down, Depressed, Hopeless 0 0 0 0 0  PHQ - 2 Score 0 0 0 0 0  Altered sleeping 0 0  0 0  Tired, decreased energy 0 0  0 0  Change in appetite 0 0  0 0  Feeling bad or failure about yourself  0 0  0 0  Trouble concentrating 0 0  0 0  Moving slowly or fidgety/restless 0 0  0 0  Suicidal thoughts 0 0  0 0  PHQ-9 Score 0 0  0 0  Difficult doing work/chores Not difficult at all Not difficult at all  Not difficult at all Not difficult at all   GERD On omeprazole 40  mg bid from pulmonary   Lab Results  Component Value Date   VITAMINB12 194 (L) 04/15/2023   Last vitamin D Lab Results  Component Value Date   VD25OH 37.12 04/15/2023     HTN bp is stable today  No cp or palpitations or headaches or edema  No side effects to medicines  BP Readings from Last 3 Encounters:  04/22/23 130/88  02/19/23 110/74  01/12/23 100/70    History of CHF   BOOP= now on 10 mg of prednisone per day (goes up and down)    DM2- 7.8 A1c last time  Sees nephrology and endocrinology  Taking ozempic (hard to get)  On prednisone 10 mg daily   Eating better now with nutra system    Lab Results  Component Value Date   NA 142 04/15/2023   K 3.8 04/15/2023   CO2 25 04/15/2023   GLUCOSE 138 (H) 04/15/2023   BUN 40 (H) 04/15/2023   CREATININE 2.23 (H)  04/15/2023   CALCIUM 9.3 04/15/2023   GFR 27.33 (L) 04/15/2023   EGFR 35 (L) 10/17/2021   GFRNONAA 27 (L) 08/19/2022   Seeing nephrology   Lab Results  Component Value Date   ALT 24 04/15/2023   AST 21 04/15/2023   ALKPHOS 47 04/15/2023   BILITOT 0.8 04/15/2023    History of gout -no recent flares  Takes allopurinol 100 mg bid  Colchicine prn Lab Results  Component Value Date   LABURIC 5.4 04/15/2023     Cholesterol Lab Results  Component Value Date   CHOL 138 04/15/2023   CHOL 169 04/09/2022   CHOL 135 02/14/2022   Lab Results  Component Value Date   HDL 47.60 04/15/2023   HDL 51.80 04/09/2022   HDL 49 02/14/2022   Lab Results  Component Value Date   LDLCALC 33 02/14/2022   LDLCALC 45 03/24/2019   LDLCALC 72 02/06/2016   Lab Results  Component Value Date   TRIG 320.0 (H) 04/15/2023   TRIG 325.0 (H) 04/09/2022   TRIG 265 (H) 02/14/2022   Lab Results  Component Value Date   CHOLHDL 3 04/15/2023   CHOLHDL 3 04/09/2022   CHOLHDL 2.8 02/14/2022   Lab Results  Component Value Date   LDLDIRECT 63.0 04/15/2023   LDLDIRECT 78.0 04/09/2022   LDLDIRECT 74.0 04/09/2021     Patient Active Problem List   Diagnosis Date Noted   Vitamin B12 deficiency 04/22/2023   Current use of proton pump inhibitor 04/14/2023   Nausea 01/21/2023   Immunocompromised due to corticosteroids (HCC) 10/09/2022   Lumbar pain 01/30/2022   Chronic diastolic heart failure (HCC) 10/23/2021   Stage 3b chronic kidney disease (CKD) (HCC) 09/17/2021   Osteopenia 05/16/2021   Steroid dependent (HCC) 04/12/2021   Primary osteoarthritis of left knee 05/29/2020   Colon cancer screening 02/13/2017   Poorly controlled type 2 diabetes mellitus with circulatory disorder (HCC) 08/13/2016   Chronic heel pain, left 08/12/2016   Upper airway cough syndrome 07/31/2016   Class 1 obesity with serious comorbidity and body mass index (BMI) of 32.0 to 32.9 in adult 05/19/2016   Encounter for general  adult medical examination with abnormal findings 01/27/2016   Pulmonary infiltrates with high  ESR c/w BOOP/ idiopathic  12/19/2015   Cough 11/29/2015   Cystic kidney disease 10/01/2015   Renal lesion 09/27/2015   Erectile dysfunction of organic origin 09/27/2015   BPH with obstruction/lower urinary tract symptoms 09/27/2015   Constipation 09/24/2015   Cyst of right  kidney 09/24/2015   CAD (coronary artery disease) 06/08/2015   Grieving 11/22/2014   Encounter for Medicare annual wellness exam 07/11/2013   Prostate cancer screening 07/03/2013   Right bundle branch block 02/22/2013   Chronic low back pain 05/13/2011   Obstructive sleep apnea 03/15/2010   Hyperlipidemia associated with type 2 diabetes mellitus (HCC) 04/12/2009   Gout 04/12/2009   Essential hypertension 04/12/2009   Coronary artery disease of bypass graft of native heart with stable angina pectoris (HCC) 04/12/2009   GERD 04/12/2009   DIVERTICULITIS, HX OF 04/12/2009   Past Medical History:  Diagnosis Date   Arthritis    Chronic kidney disease    Complication of anesthesia    constipation and inability to  urinate happened a few days after cataract surgery    Diabetes (HCC)    type 2    Diverticulitis    GERD (gastroesophageal reflux disease)    Gout    Heart attack (HCC)    Heart disease    Heart murmur    Hyperglycemia    Hyperlipidemia    Hypertension    Osteoporosis    Pneumonia    hx of BOOP followed by Dr Sherene Sires    Sleep apnea    cpapp - setting at 15    Past Surgical History:  Procedure Laterality Date   ACROMIO-CLAVICULAR JOINT REPAIR Left 05/05/2019   Procedure: SHOULDER ARTHROSCOPIC ASSISTED ACROMIO-CLAVICULAR JOINT REPAIR;  Surgeon: Jones Broom, MD;  Location: [facility];  Service: Orthopedics;  Laterality: Left;   CATARACT EXTRACTION Bilateral    CORONARY ARTERY BYPASS GRAFT     [place]   LEFT HEART CATH AND CORS/GRAFTS ANGIOGRAPHY N/A 09/19/2021   Procedure: LEFT HEART CATH AND  CORS/GRAFTS ANGIOGRAPHY;  Surgeon: Lyn Records, MD;  Location: [facility];  Service: Cardiovascular;  Laterality: N/A;   open heart surgery  09/09/1995-1998   5 bypass   REPAIR KNEE LIGAMENT     right   SHOULDER ARTHROSCOPY Left 05/05/2019   Procedure: ARTHROSCOPY SHOULDER;  Surgeon: Jones Broom, MD;  Location: [facility];  Service: Orthopedics;  Laterality: Left;   TONSILLECTOMY     TOTAL KNEE ARTHROPLASTY Left 05/29/2020   Procedure: LEFT TOTAL KNEE ARTHROPLASTY;  Surgeon: Marcene Corning, MD;  Location: [facility];  Service: Orthopedics;  Laterality: Left;   Social History   Tobacco Use   Smoking status: Former    Current packs/day: 0.00    Average packs/day: 1 pack/day for 3.0 years (3.0 ttl pk-yrs)    Types: Cigars, Cigarettes    Start date: 11/25/2001    Quit date: 11/25/2004    Years since quitting: 18.4   Smokeless tobacco: Never   Tobacco comments:    occas. cigar quit 2006  Vaping Use   Vaping status: Never Used  Substance Use Topics   Alcohol use: Yes    Alcohol/week: 1.0 standard drink of alcohol    Types: 1 Standard drinks or equivalent per week    Comment: rare   Drug use: No   Family History  Problem Relation Age of Onset   Heart disease Mother    Heart disease Father    Stroke Father    Heart attack Son 81   Kidney disease Neg Hx    Prostate cancer Neg Hx    Diabetes Neg Hx    Allergies  Allergen Reactions   Oxycodone-Acetaminophen Other (See Comments)    Stops breathing; has tolerated Tylenol before   Oxycodone-Acetaminophen Anaphylaxis   Current Outpatient Medications  on File Prior to Visit  Medication Sig Dispense Refill   allopurinol (ZYLOPRIM) 100 MG tablet TAKE 1 TABLET BY MOUTH TWICE A DAY 180 tablet 3   aspirin EC (ASPIRIN 81) 81 MG tablet Take 81 mg by mouth daily. Swallow whole.     Calcium Carbonate-Vit D-Min (CALCIUM-VITAMIN D-MINERALS) 600-800 MG-UNIT CHEW Chew 1 tablet by mouth daily.     Continuous Blood Gluc Receiver (FREESTYLE  LIBRE 2 READER) DEVI USE AS INSTRUCTED TO CHECK BLOOD SUGARS. 1 each 0   Continuous Blood Gluc Sensor (FREESTYLE LIBRE 2 SENSOR) MISC 1 Device by Does not apply route every 14 (fourteen) days. 6 each 3   ezetimibe (ZETIA) 10 MG tablet TAKE 1 TABLET BY MOUTH EVERY DAY 90 tablet 0   famotidine (PEPCID) 20 MG tablet TAKE 1 TABLET BY MOUTH EVERYDAY AT BEDTIME 90 tablet 1   furosemide (LASIX) 20 MG tablet TAKE 1 TABLET BY MOUTH EVERY DAY 90 tablet 3   gabapentin (NEURONTIN) 300 MG capsule Take 1 capsule (300 mg total) by mouth 3 (three) times daily. (Patient taking differently: Take 300 mg by mouth at bedtime.) 270 capsule 2   Glucosamine HCl (GLUCOSAMINE PO) Take 1,500 mg by mouth 2 (two) times daily.      glucose blood (FREESTYLE LITE) test strip 1 each by Other route 4 (four) times daily. E11.65 400 strip 0   insulin isophane & regular human KwikPen (NOVOLIN 70/30 KWIKPEN) (70-30) 100 UNIT/ML KwikPen Inject 45 Units into the skin daily with breakfast. And pen needles 1/day (Patient taking differently: Inject 35 Units into the skin daily with breakfast. And pen needles 1/day) 30 mL 3   insulin lispro (HUMALOG KWIKPEN) 200 UNIT/ML KwikPen Inject 20 Units into the skin 2 (two) times daily before a meal. INJECT 12-15 UNITS INTO THE SKIN 2 (TWO) TIMES DAILY BEFORE A MEAL. (Patient taking differently: Inject 15 Units into the skin 2 (two) times daily before a meal. INJECT 12-15 UNITS INTO THE SKIN 2 (TWO) TIMES DAILY BEFORE A MEAL.) 45 mL 3   Insulin Pen Needle (BD PEN NEEDLE NANO 2ND GEN) 32G X 4 MM MISC USE 3 TIMES A DAY 300 each 1   ketoconazole (NIZORAL) 2 % cream Apply 1 Application topically 2 (two) times daily.     Lancets (FREESTYLE) lancets 1 EACH BY OTHER ROUTE 4 (FOUR) TIMES DAILY. E11.65 400 each 0   Lutein 20 MG CAPS Take 20 mg by mouth daily.      meclizine (ANTIVERT) 12.5 MG tablet TAKE 1-2 TABLETS BY MOUTH THREE TIMES A DAY AS NEEDED FOR DIZZINESS     nitroGLYCERIN (NITROSTAT) 0.4 MG SL tablet  Place 1 tablet (0.4 mg total) under the tongue every 5 (five) minutes x 3 doses as needed for chest pain. 25 tablet 1   omeprazole (PRILOSEC) 40 MG capsule TAKE 1 CAPUSLE BY MOUTH 30- 60 MIN BEFORE YOUR FIRST AND LAST MEALS OF THE DAY 180 capsule 3   ondansetron (ZOFRAN) 8 MG tablet Take 1 tablet (8 mg total) by mouth every 8 (eight) hours as needed for nausea or vomiting. From narcotic pain medicine 15 tablet 0   polyethylene glycol (MIRALAX / GLYCOLAX) 17 g packet Take 17 g by mouth daily.     predniSONE (DELTASONE) 10 MG tablet TAKE 2 TABLETS BY MOUTH UNTIL BETTER THEN TAPER TO 1/2 TABLET (Patient taking differently: Take 5 mg by mouth daily with breakfast.) 100 tablet 1   rosuvastatin (CRESTOR) 40 MG tablet TAKE 1 TABLET BY MOUTH  EVERY DAY 90 tablet 2   Semaglutide, 2 MG/DOSE, (OZEMPIC, 2 MG/DOSE,) 8 MG/3ML SOPN Inject 2 mg into the skin once a week. 9 mL 3   No current facility-administered medications on file prior to visit.    Review of Systems  Constitutional:  Positive for fatigue. Negative for activity change, appetite change, fever and unexpected weight change.  HENT:  Negative for congestion, rhinorrhea, sore throat and trouble swallowing.   Eyes:  Negative for pain, redness, itching and visual disturbance.  Respiratory:  Positive for cough and shortness of breath. Negative for chest tightness and wheezing.   Cardiovascular:  Positive for leg swelling. Negative for chest pain and palpitations.  Gastrointestinal:  Negative for abdominal pain, blood in stool, constipation, diarrhea and nausea.  Endocrine: Negative for cold intolerance, heat intolerance, polydipsia and polyuria.  Genitourinary:  Negative for difficulty urinating, dysuria, frequency and urgency.  Musculoskeletal:  Negative for arthralgias, joint swelling and myalgias.  Skin:  Negative for pallor and rash.  Neurological:  Negative for dizziness, tremors, weakness, numbness and headaches.  Hematological:  Negative for  adenopathy. Does not bruise/bleed easily.  Psychiatric/Behavioral:  Negative for decreased concentration and dysphoric mood. The patient is not nervous/anxious.        Objective:   Physical Exam Constitutional:      General: He is not in acute distress.    Appearance: Normal appearance. He is well-developed. He is obese. He is not ill-appearing or diaphoretic.  HENT:     Head: Normocephalic and atraumatic.     Right Ear: Tympanic membrane, ear canal and external ear normal.     Left Ear: Tympanic membrane, ear canal and external ear normal.     Nose: Nose normal. No congestion.     Mouth/Throat:     Mouth: Mucous membranes are moist.     Pharynx: Oropharynx is clear. No posterior oropharyngeal erythema.  Eyes:     General: No scleral icterus.       Right eye: No discharge.        Left eye: No discharge.     Conjunctiva/sclera: Conjunctivae normal.     Pupils: Pupils are equal, round, and reactive to light.  Neck:     Thyroid: No thyromegaly.     Vascular: No carotid bruit or JVD.  Cardiovascular:     Rate and Rhythm: Normal rate and regular rhythm.     Pulses: Normal pulses.     Heart sounds: Normal heart sounds.     No gallop.  Pulmonary:     Effort: Pulmonary effort is normal. No respiratory distress.     Breath sounds: Normal breath sounds. No wheezing or rales.     Comments: Good air exch Chest:     Chest wall: No tenderness.  Abdominal:     General: Bowel sounds are normal. There is no distension or abdominal bruit.     Palpations: Abdomen is soft. There is no mass.     Tenderness: There is no abdominal tenderness.     Hernia: No hernia is present.  Musculoskeletal:        General: No tenderness.     Cervical back: Normal range of motion and neck supple. No rigidity. No muscular tenderness.     Right lower leg: No edema.     Left lower leg: No edema.  Lymphadenopathy:     Cervical: No cervical adenopathy.  Skin:    General: Skin is warm and dry.      Coloration: Skin is not  pale.     Findings: No erythema or rash.     Comments: Fair Solar lentigines diffusely Some sks   Neurological:     Mental Status: He is alert.     Cranial Nerves: No cranial nerve deficit.     Motor: No weakness or abnormal muscle tone.     Coordination: Coordination normal.     Gait: Gait normal.     Deep Tendon Reflexes: Reflexes are normal and symmetric. Reflexes normal.  Psychiatric:        Mood and Affect: Mood normal.        Cognition and Memory: Cognition and memory normal.           Assessment & Plan:   Problem List Items Addressed This Visit       Cardiovascular and Mediastinum   Essential hypertension (Chronic)    BP: 130/88  Had some hypotension and syncope Off beta blocker Just taking lasix 20 mg  Under care of cardiology        Chronic diastolic heart failure (HCC)    Continues cardiology care  Lasix 20 mg daily Under cardiology care In setting of CAD Had to stop beta blocker due to syncope        Respiratory   Obstructive sleep apnea    Cpap -continues        Digestive   GERD    Aggressive ppi and pepcid in light of chronic cough and BOOP Trying to loose weight  Also taking prednisone          Endocrine   Poorly controlled type 2 diabetes mellitus with circulatory disorder (HCC)    Continues endocrinology care  Last A1c 7.8  On chronic prednisone  Ozempic-hard to get at times Encouraged low glycemic diet       Hyperlipidemia associated with type 2 diabetes mellitus (HCC)    Disc goals for lipids and reasons to control them Rev last labs with pt Rev low sat fat diet in detail  LDL of 33 Continues zetia 10 mg daily  Crestor 40 mg daily   Trig up likely due to elevated bs        Musculoskeletal and Integument   Osteopenia    Dexa ordered Discussed fall prevention No fractures D level is therapeutic  Encouraged exercise as tolerated         Genitourinary   Stage 3b chronic kidney disease  (CKD) (HCC) (Chronic)    Continues nephrology follow up        BPH with obstruction/lower urinary tract symptoms    Lab Results  Component Value Date   PSA 1.38 04/15/2023   PSA 1.25 04/09/2022   PSA 1.18 04/09/2021    No clinical changes Does not desire treatment         Other   Steroid dependent (HCC) (Chronic)    Dexa ordered Continues care for DM  Continues 10 mg daily for BOOP with chronic cough      Relevant Orders   DG Bone Density   Vitamin B12 deficiency    New Lab Results  Component Value Date   VITAMINB12 194 (L) 04/15/2023   On ppi he cannot wean   B12 shot today  Will plan weekly for 4 weeks Instructed to take oral 1000 mcg daily  Re check 2 mo      Prostate cancer screening    Lab Results  Component Value Date   PSA 1.38 04/15/2023   PSA 1.25 04/09/2022   PSA 1.18  04/09/2021    No clinical changes       Immunocompromised due to corticosteroids (HCC)    Continue to watch closely  On prednisone for BOOP Encouraged to stay utd with vaccines      Gout    Continues allopurinol Colchicine prn  Lab Results  Component Value Date   LABURIC 5.4 04/15/2023   No recent flares       Encounter for general adult medical examination with abnormal findings - Primary    Reviewed health habits including diet and exercise and skin cancer prevention Reviewed appropriate screening tests for age  Also reviewed health mt list, fam hx and immunization status , as well as social and family history   See HPI Labs reviewed and ordered New finding is vitamin B12 deficiency- shot given and plan made  Plans flu shot in the fall Stable psa  Colonoscopy due for polyps but risks outweigh benefits (pulm and cardiac dz) Dexa ordered-pt will schedule / discussed fall prevention/ no fracture, D level is in normal range Discussed strength building exercise when able PHQ of 0       Current use of proton pump inhibitor    Lab Results  Component Value Date    VITAMINB12 194 (L) 04/15/2023   Low=will supplement   Last vitamin D Lab Results  Component Value Date   VD25OH 37.12 04/15/2023    Continue to watch renal fxn       Colon cancer screening    Colonoscopy 2018  Had 5 y recall but health problems make him too high risk currently  No stool changes  Would skip cologuard due to history of polyps

## 2023-04-22 NOTE — Assessment & Plan Note (Signed)
Continue to watch closely  On prednisone for BOOP Encouraged to stay utd with vaccines

## 2023-04-22 NOTE — Assessment & Plan Note (Signed)
Continues endocrinology care  Last A1c 7.8  On chronic prednisone  Ozempic-hard to get at times Encouraged low glycemic diet

## 2023-04-22 NOTE — Assessment & Plan Note (Signed)
Lab Results  Component Value Date   PSA 1.38 04/15/2023   PSA 1.25 04/09/2022   PSA 1.18 04/09/2021    No clinical changes

## 2023-04-22 NOTE — Assessment & Plan Note (Signed)
Dexa ordered Continues care for DM  Continues 10 mg daily for BOOP with chronic cough

## 2023-04-22 NOTE — Assessment & Plan Note (Signed)
Cpap -continues

## 2023-04-23 ENCOUNTER — Encounter (INDEPENDENT_AMBULATORY_CARE_PROVIDER_SITE_OTHER): Payer: Self-pay

## 2023-04-29 ENCOUNTER — Ambulatory Visit (INDEPENDENT_AMBULATORY_CARE_PROVIDER_SITE_OTHER): Payer: PPO

## 2023-04-29 DIAGNOSIS — E538 Deficiency of other specified B group vitamins: Secondary | ICD-10-CM | POA: Diagnosis not present

## 2023-04-29 MED ORDER — CYANOCOBALAMIN 1000 MCG/ML IJ SOLN
1000.0000 ug | Freq: Once | INTRAMUSCULAR | Status: AC
Start: 2023-04-29 — End: 2023-04-29
  Administered 2023-04-29: 1000 ug via INTRAMUSCULAR

## 2023-04-29 NOTE — Progress Notes (Signed)
Per orders of Dr. Roxy Manns, injection of vitamin b 12 given by Lewanda Rife in right deltoid. Patient tolerated injection well. Patient will make appointment for 1 week.

## 2023-04-30 ENCOUNTER — Ambulatory Visit (INDEPENDENT_AMBULATORY_CARE_PROVIDER_SITE_OTHER): Payer: PPO

## 2023-04-30 VITALS — Ht 70.0 in | Wt 242.0 lb

## 2023-04-30 DIAGNOSIS — Z0000 Encounter for general adult medical examination without abnormal findings: Secondary | ICD-10-CM

## 2023-04-30 NOTE — Patient Instructions (Signed)
[name] , Thank you for taking time to come for your Medicare Wellness Visit. I appreciate your ongoing commitment to your health goals. Please review the following plan we discussed and let me know if I can assist you in the future.   Referrals/Orders/Follow-Ups/Clinician Recommendations: Aim for 30 minutes of exercise or brisk walking, 6-8 glasses of water, and 5 servings of fruits and vegetables each day.   This is a list of the screening recommended for you and due dates:  Health Maintenance  Topic Date Due   COVID-19 Vaccine (7 - 2023-24 season) 08/11/2022   Medicare Annual Wellness Visit  04/18/2023   Flu Shot  04/09/2023   Complete foot exam   04/17/2023   Yearly kidney health urinalysis for diabetes  04/21/2028*   Hemoglobin A1C  04/21/2028*   Eye exam for diabetics  11/19/2023   Yearly kidney function blood test for diabetes  04/14/2024   DTaP/Tdap/Td vaccine (3 - Tdap) 02/11/2026   Pneumonia Vaccine  Completed   Hepatitis C Screening  Completed   Zoster (Shingles) Vaccine  Completed   HPV Vaccine  Aged Out   Colon Cancer Screening  Discontinued  *Topic was postponed. The date shown is not the original due date.    Advanced directives: (In Chart) A copy of your advanced directives are scanned into your chart should your provider ever need it.  Next Medicare Annual Wellness Visit scheduled for next year: Yes

## 2023-04-30 NOTE — Progress Notes (Signed)
Subjective:   Kurt Aguilar is a 77 y.o. male who presents for Medicare Annual/Subsequent preventive examination.  Visit Complete: Virtual  I connected with  Charlott Holler on 04/30/23 by a audio enabled telemedicine application and verified that I am speaking with the correct person using two identifiers.  Patient Location: Home  Provider Location: Office/Clinic  I discussed the limitations of evaluation and management by telemedicine. The patient expressed understanding and agreed to proceed.  Vital Signs: Because this visit was a virtual/telehealth visit, some criteria may be missing or patient reported. Any vitals not documented were not able to be obtained and vitals that have been documented are patient reported.    Review of Systems      Cardiac Risk Factors include: advanced age (>46men, >49 women);hypertension;diabetes mellitus;dyslipidemia;obesity (BMI >30kg/m2);sedentary lifestyle     Objective:    Today's Vitals   04/30/23 1334  Weight: 242 lb (109.8 kg)  Height: 5\' 10"  (1.778 m)   Body mass index is 34.72 kg/m.     04/30/2023    1:47 PM 08/18/2022    6:18 PM 04/17/2022    2:19 PM 09/25/2021   11:48 AM 09/18/2021    4:11 AM 04/10/2021   11:23 AM 05/17/2020    2:24 PM  Advanced Directives  Does Patient Have a Medical Advance Directive? Yes Yes Yes No No Yes Yes  Type of Estate agent of [place];Living will Healthcare Power of [place];Out of facility DNR (pink MOST or yellow form) Healthcare Power of [place];Living will   Healthcare Power of [place];Living will Healthcare Power of Attorney  Does patient want to make changes to medical advance directive? No - Patient declined No - Patient declined Yes (Inpatient - patient defers changing a medical advance directive and declines information at this time)      Copy of Healthcare Power of Attorney in Chart? Yes - validated most recent copy scanned in chart (See row information) No - copy  requested Yes - validated most recent copy scanned in chart (See row information)   Yes - validated most recent copy scanned in chart (See row information)   Would patient like information on creating a medical advance directive?    No - Patient declined No - Patient declined    Pre-existing out of facility DNR order (yellow form or pink MOST form)  Physician notified to receive inpatient order         Current Medications (verified) Outpatient Encounter Medications as of 04/30/2023  Medication Sig   allopurinol (ZYLOPRIM) 100 MG tablet TAKE 1 TABLET BY MOUTH TWICE A DAY   aspirin EC (ASPIRIN 81) 81 MG tablet Take 81 mg by mouth daily. Swallow whole.   Calcium Carbonate-Vit D-Min (CALCIUM-VITAMIN D-MINERALS) 600-800 MG-UNIT CHEW Chew 1 tablet by mouth daily.   Continuous Blood Gluc Receiver (FREESTYLE LIBRE 2 READER) DEVI USE AS INSTRUCTED TO CHECK BLOOD SUGARS.   Continuous Blood Gluc Sensor (FREESTYLE LIBRE 2 SENSOR) MISC 1 Device by Does not apply route every 14 (fourteen) days.   ezetimibe (ZETIA) 10 MG tablet TAKE 1 TABLET BY MOUTH EVERY DAY   famotidine (PEPCID) 20 MG tablet TAKE 1 TABLET BY MOUTH EVERYDAY AT BEDTIME   furosemide (LASIX) 20 MG tablet TAKE 1 TABLET BY MOUTH EVERY DAY   gabapentin (NEURONTIN) 300 MG capsule Take 1 capsule (300 mg total) by mouth 3 (three) times daily. (Patient taking differently: Take 300 mg by mouth at bedtime.)   Glucosamine HCl (GLUCOSAMINE PO) Take 1,500 mg by  mouth 2 (two) times daily.    glucose blood (FREESTYLE LITE) test strip 1 each by Other route 4 (four) times daily. E11.65   insulin isophane & regular human KwikPen (NOVOLIN 70/30 KWIKPEN) (70-30) 100 UNIT/ML KwikPen Inject 45 Units into the skin daily with breakfast. And pen needles 1/day (Patient taking differently: Inject 35 Units into the skin daily with breakfast. And pen needles 1/day)   insulin lispro (HUMALOG KWIKPEN) 200 UNIT/ML KwikPen Inject 20 Units into the skin 2 (two) times daily  before a meal. INJECT 12-15 UNITS INTO THE SKIN 2 (TWO) TIMES DAILY BEFORE A MEAL. (Patient taking differently: Inject 15 Units into the skin 2 (two) times daily before a meal. INJECT 12-15 UNITS INTO THE SKIN 2 (TWO) TIMES DAILY BEFORE A MEAL.)   Insulin Pen Needle (BD PEN NEEDLE NANO 2ND GEN) 32G X 4 MM MISC USE 3 TIMES A DAY   ketoconazole (NIZORAL) 2 % cream Apply 1 Application topically 2 (two) times daily.   Lancets (FREESTYLE) lancets 1 EACH BY OTHER ROUTE 4 (FOUR) TIMES DAILY. E11.65   Multiple Vitamins-Minerals (PRESERVISION AREDS 2 PO) Take by mouth.   omeprazole (PRILOSEC) 40 MG capsule TAKE 1 CAPUSLE BY MOUTH 30- 60 MIN BEFORE YOUR FIRST AND LAST MEALS OF THE DAY   ondansetron (ZOFRAN) 8 MG tablet Take 1 tablet (8 mg total) by mouth every 8 (eight) hours as needed for nausea or vomiting. From narcotic pain medicine   polyethylene glycol (MIRALAX / GLYCOLAX) 17 g packet Take 17 g by mouth daily.   predniSONE (DELTASONE) 10 MG tablet TAKE 2 TABLETS BY MOUTH UNTIL BETTER THEN TAPER TO 1/2 TABLET (Patient taking differently: Take 5 mg by mouth daily with breakfast.)   rosuvastatin (CRESTOR) 40 MG tablet TAKE 1 TABLET BY MOUTH EVERY DAY   Semaglutide, 2 MG/DOSE, (OZEMPIC, 2 MG/DOSE,) 8 MG/3ML SOPN Inject 2 mg into the skin once a week.   colchicine 0.6 MG tablet Take 1 tablet (0.6 mg total) by mouth as needed (GOUT FLARE). TAKE 1 TABLET BY MOUTH TWICE A DAY WITH A MEAL AS NEEDED FOR ACUTE FLARE OF GOUT (Patient not taking: Reported on 04/30/2023)   Lutein 20 MG CAPS Take 20 mg by mouth daily.  (Patient not taking: Reported on 04/30/2023)   meclizine (ANTIVERT) 12.5 MG tablet TAKE 1-2 TABLETS BY MOUTH THREE TIMES A DAY AS NEEDED FOR DIZZINESS (Patient not taking: Reported on 04/30/2023)   nitroGLYCERIN (NITROSTAT) 0.4 MG SL tablet Place 1 tablet (0.4 mg total) under the tongue every 5 (five) minutes x 3 doses as needed for chest pain. (Patient not taking: Reported on 04/30/2023)   No  facility-administered encounter medications on file as of 04/30/2023.    Allergies (verified) Oxycodone-acetaminophen and Oxycodone-acetaminophen   History: Past Medical History:  Diagnosis Date   Arthritis    Chronic kidney disease    Complication of anesthesia    constipation and inability to  urinate happened a few days after cataract surgery    Diabetes (HCC)    type 2    Diverticulitis    GERD (gastroesophageal reflux disease)    Gout    Heart attack (HCC)    Heart disease    Heart murmur    Hyperglycemia    Hyperlipidemia    Hypertension    Osteoporosis    Pneumonia    hx of BOOP followed by Dr Sherene Sires    Sleep apnea    cpapp - setting at 15    Past Surgical History:  Procedure Laterality Date   ACROMIO-CLAVICULAR JOINT REPAIR Left 05/05/2019   Procedure: SHOULDER ARTHROSCOPIC ASSISTED ACROMIO-CLAVICULAR JOINT REPAIR;  Surgeon: Jones Broom, MD;  Location: [facility];  Service: Orthopedics;  Laterality: Left;   CATARACT EXTRACTION Bilateral    CORONARY ARTERY BYPASS GRAFT     [place]   LEFT HEART CATH AND CORS/GRAFTS ANGIOGRAPHY N/A 09/19/2021   Procedure: LEFT HEART CATH AND CORS/GRAFTS ANGIOGRAPHY;  Surgeon: Lyn Records, MD;  Location: [facility];  Service: Cardiovascular;  Laterality: N/A;   open heart surgery  09/09/1995-1998   5 bypass   REPAIR KNEE LIGAMENT     right   SHOULDER ARTHROSCOPY Left 05/05/2019   Procedure: ARTHROSCOPY SHOULDER;  Surgeon: Jones Broom, MD;  Location: [facility];  Service: Orthopedics;  Laterality: Left;   TONSILLECTOMY     TOTAL KNEE ARTHROPLASTY Left 05/29/2020   Procedure: LEFT TOTAL KNEE ARTHROPLASTY;  Surgeon: Marcene Corning, MD;  Location: [facility];  Service: Orthopedics;  Laterality: Left;   Family History  Problem Relation Age of Onset   Heart disease Mother    Heart disease Father    Stroke Father    Heart attack Son 17   Kidney disease Neg Hx    Prostate cancer Neg Hx    Diabetes Neg Hx    Social  History   Socioeconomic History   Marital status: Married    Spouse name: Not on file   Number of children: 4   Years of education: 14   Highest education level: Not on file  Occupational History   Occupation: Engineer, petroleum: RETIRED  Tobacco Use   Smoking status: Former    Current packs/day: 0.00    Average packs/day: 1 pack/day for 3.0 years (3.0 ttl pk-yrs)    Types: Cigars, Cigarettes    Start date: 11/25/2001    Quit date: 11/25/2004    Years since quitting: 18.4   Smokeless tobacco: Never   Tobacco comments:    occas. cigar quit 2006  Vaping Use   Vaping status: Never Used  Substance and Sexual Activity   Alcohol use: Yes    Alcohol/week: 1.0 standard drink of alcohol    Types: 1 Standard drinks or equivalent per week    Comment: rare   Drug use: No   Sexual activity: Never  Other Topics Concern   Not on file  Social History Narrative   Not on file   Social Determinants of Health   Financial Resource Strain: Low Risk  (04/30/2023)   Overall Financial Resource Strain (CARDIA)    Difficulty of Paying Living Expenses: Not hard at all  Food Insecurity: No Food Insecurity (04/30/2023)   Hunger Vital Sign    Worried About Running Out of Food in the Last Year: Never true    Ran Out of Food in the Last Year: Never true  Transportation Needs: No Transportation Needs (04/30/2023)   PRAPARE - Administrator, Civil Service (Medical): No    Lack of Transportation (Non-Medical): No  Physical Activity: Insufficiently Active (04/30/2023)   Exercise Vital Sign    Days of Exercise per Week: 2 days    Minutes of Exercise per Session: 30 min  Stress: No Stress Concern Present (04/30/2023)   [employer] of Occupational Health - Occupational Stress Questionnaire    Feeling of Stress : Not at all  Social Connections: Socially Integrated (04/30/2023)   Social Connection and Isolation Panel [NHANES]    Frequency of Communication with  Friends and Family:  More than three times a week    Frequency of Social Gatherings with Friends and Family: More than three times a week    Attends Religious Services: More than 4 times per year    Active Member of [employer] or Organizations: Yes    Attends Engineer, structural: More than 4 times per year    Marital Status: Married    Tobacco Counseling Counseling given: Not Answered Tobacco comments: occas. cigar quit 2006   Clinical Intake:  Pre-visit preparation completed: Yes  Pain : No/denies pain     BMI - recorded: 34.72 Nutritional Status: BMI > 30  Obese Nutritional Risks: None Diabetes: Yes CBG done?: Yes (112 per pt) CBG resulted in Enter/ Edit results?: No Did pt. bring in CBG monitor from home?: No  How often do you need to have someone help you when you read instructions, pamphlets, or other written materials from your doctor or pharmacy?: 1 - Never  Interpreter Needed?: No  Information entered by :: C.Roddy Bellamy LPN   Activities of Daily Living    04/30/2023    1:47 PM 08/18/2022    6:18 PM  In your present state of health, do you have any difficulty performing the following activities:  Hearing? 1 1  Comment wears aids   Vision? 1 1  Comment only dificult reading small print   Difficulty concentrating or making decisions? 0 0  Walking or climbing stairs? 1 1  Comment walker, cane   Dressing or bathing? 0 1  Doing errands, shopping? 0 0  Preparing Food and eating ? N   Using the Toilet? N   In the past six months, have you accidently leaked urine? N   Do you have problems with loss of bowel control? N   Managing your Medications? N   Managing your Finances? N   Housekeeping or managing your Housekeeping? N     Patient Care Team: Tower, Audrie Gallus, MD as PCP - General Mariah Milling, Tollie Pizza, MD as PCP - Cardiology (Cardiology) Antonieta Iba, MD as Consulting Physician (Cardiology) Nyoka Cowden, MD as Consulting Physician (Pulmonary Disease) Sudie Grumbling.,  MD as Referring Physician (Dentistry) Kathyrn Sheriff, [facility] (Inactive) as Pharmacist (Pharmacist)  Indicate any recent Medical Services you may have received from other than [facility] providers in the past year (date may be approximate).     Assessment:   This is a routine wellness examination for Jadynn.  Hearing/Vision screen Hearing Screening - Comments:: Bilateral hearing aids Vision Screening - Comments:: Glasses - Dr.Woodard - UTD on eye exams Woodard  Dietary issues and exercise activities discussed:     Goals Addressed             This Visit's Progress    Patient Stated       Lose weight and be more active.       Depression Screen    04/30/2023    1:37 PM 04/22/2023    8:52 AM 04/17/2022    2:18 PM 04/16/2022   10:05 AM 04/10/2021   11:24 AM 04/09/2020    1:31 PM 04/01/2019    2:52 PM  PHQ 2/9 Scores  PHQ - 2 Score 0 0 0 0 0 0 0  PHQ- 9 Score 0 0 0  0 0     Fall Risk    04/30/2023    1:40 PM 04/22/2023    8:52 AM 10/09/2022    1:57 PM 04/17/2022    2:20  PM 04/16/2022   10:04 AM  Fall Risk   Falls in the past year? 1 0 0 1 1  Number falls in past yr: 1 0 1 0 0  Injury with Fall? 0 0 1 0   Risk for fall due to : Impaired balance/gait;Impaired mobility  Other (Comment) History of fall(s)   Risk for fall due to: Comment   Medication was off and made his passed out    Follow up Falls evaluation completed;Falls prevention discussed;Education provided Falls evaluation completed Falls evaluation completed Falls prevention discussed Falls evaluation completed    MEDICARE RISK AT HOME: Medicare Risk at Home Any stairs in or around the home?: No If so, are there any without handrails?: No Home free of loose throw rugs in walkways, pet beds, electrical cords, etc?: Yes Adequate lighting in your home to reduce risk of falls?: Yes Life alert?: No Use of a cane, walker or w/c?: Yes (walker, cane) Grab bars in the bathroom?: Yes Shower chair or bench in shower?:  Yes Elevated toilet seat or a handicapped toilet?: Yes  TIMED UP AND GO:  Was the test performed?  No    Cognitive Function:    04/10/2021   11:27 AM 04/09/2020    1:39 PM 03/08/2018    8:14 AM 02/13/2017   10:31 AM 02/06/2016   12:09 PM  MMSE - Mini Mental State Exam  Not completed: Refused      Orientation to time  5 5 5 5   Orientation to Place  5 5 5 5   Registration  3 3 3 3   Attention/ Calculation  5 0 0 0  Recall  3 3 3 3   Language- name 2 objects   0 0 0  Language- repeat  1 1 1 1   Language- follow 3 step command   3 3 3   Language- read & follow direction   0 0 0  Write a sentence   0 0 0  Copy design   0 0 0  Total score   20 20 20         04/30/2023    1:49 PM  6CIT Screen  What Year? 0 points  What month? 0 points  What time? 0 points  Count back from 20 0 points  Months in reverse 0 points  Repeat phrase 0 points  Total Score 0 points    Immunizations Immunization History  Administered Date(s) Administered   Fluad Quad(high Dose 65+) 06/25/2019, 05/29/2022   Influenza Split 07/09/2011   Influenza Whole 05/14/2010, 06/09/2012   Influenza, High Dose Seasonal PF 05/22/2014, 07/04/2015, 05/16/2016, 07/09/2018   Influenza,inj,Quad PF,6+ Mos 07/11/2013   Influenza-Unspecified 07/03/2017, 06/14/2021   Moderna Sars-Covid-2 Vaccination 11/18/2019, 12/27/2019, 07/24/2020, 04/12/2021, 06/14/2021   Pfizer Covid-19 Vaccine Bivalent Booster 27yrs & up 06/16/2022   Pneumococcal Conjugate-13 08/07/2014   Pneumococcal Polysaccharide-23 09/08/2002, 07/11/2013   Respiratory Syncytial Virus Vaccine,Recomb Aduvanted(Arexvy) 06/16/2022   Td 09/08/2004, 02/12/2016   Zoster Recombinant(Shingrix) 04/06/2018, 06/17/2018   Zoster, Live 07/09/2011    TDAP status: Up to date  Flu Vaccine status: Due, Education has been provided regarding the importance of this vaccine. Advised may receive this vaccine at local pharmacy or Health Dept. Aware to provide a copy of the vaccination  record if obtained from local pharmacy or Health Dept. Verbalized acceptance and understanding.  Pneumococcal vaccine status: Up to date  Covid-19 vaccine status: Information provided on how to obtain vaccines.   Qualifies for Shingles Vaccine? Yes   Zostavax completed Yes  Shingrix Completed?: Yes  Screening Tests Health Maintenance  Topic Date Due   COVID-19 Vaccine (7 - 2023-24 season) 08/11/2022   Medicare Annual Wellness (AWV)  04/18/2023   INFLUENZA VACCINE  04/09/2023   FOOT EXAM  04/17/2023   Diabetic kidney evaluation - Urine ACR  04/21/2028 (Originally 04/17/2023)   HEMOGLOBIN A1C  04/21/2028 (Originally 03/07/2023)   OPHTHALMOLOGY EXAM  11/19/2023   Diabetic kidney evaluation - eGFR measurement  04/14/2024   DTaP/Tdap/Td (3 - Tdap) 02/11/2026   Pneumonia Vaccine 27+ Years old  Completed   Hepatitis C Screening  Completed   Zoster Vaccines- Shingrix  Completed   HPV VACCINES  Aged Out   Colonoscopy  Discontinued    Health Maintenance  Health Maintenance Due  Topic Date Due   COVID-19 Vaccine (7 - 2023-24 season) 08/11/2022   Medicare Annual Wellness (AWV)  04/18/2023   INFLUENZA VACCINE  04/09/2023   FOOT EXAM  04/17/2023    Colorectal cancer screening: No longer required.   Lung Cancer Screening: (Low Dose CT Chest recommended if Age 1-80 years, 20 pack-year currently smoking OR have quit w/in 15years.) does not qualify.   Lung Cancer Screening Referral:    Additional Screening:  Hepatitis C Screening: does qualify; Completed 02/06/16  Vision Screening: Recommended annual ophthalmology exams for early detection of glaucoma and other disorders of the eye. Is the patient up to date with their annual eye exam?  Yes  Who is the provider or what is the name of the office in which the patient attends annual eye exams? Dr.Woodard If pt is not established with a provider, would they like to be referred to a provider to establish care? No .   Dental Screening:  Recommended annual dental exams for proper oral hygiene  Diabetic Foot Exam: Diabetic Foot Exam: Completed 04/16/22 (DUE)  Community Resource Referral / Chronic Care Management: CRR required this visit?  No   CCM required this visit?  No     Plan:     I have personally reviewed and noted the following in the patient's chart:   Medical and social history Use of alcohol, tobacco or illicit drugs  Current medications and supplements including opioid prescriptions. Patient is not currently taking opioid prescriptions. Functional ability and status Nutritional status Physical activity Advanced directives List of other physicians Hospitalizations, surgeries, and ER visits in previous 12 months Vitals Screenings to include cognitive, depression, and falls Referrals and appointments  In addition, I have reviewed and discussed with patient certain preventive protocols, quality metrics, and best practice recommendations. A written personalized care plan for preventive services as well as general preventive health recommendations were provided to patient.     ROJELIO VULTAGGIO, LPN   [id number]/82/[id number]   After Visit Summary: (MyChart) Due to this being a telephonic visit, the after visit summary with patients personalized plan was offered to patient via MyChart   Nurse Notes: none

## 2023-05-01 ENCOUNTER — Encounter: Payer: Self-pay | Admitting: Medical

## 2023-05-01 ENCOUNTER — Ambulatory Visit: Payer: PPO | Attending: Medical | Admitting: Medical

## 2023-05-01 VITALS — BP 118/88 | HR 89 | Ht 70.0 in | Wt 246.0 lb

## 2023-05-01 DIAGNOSIS — I2510 Atherosclerotic heart disease of native coronary artery without angina pectoris: Secondary | ICD-10-CM | POA: Diagnosis not present

## 2023-05-01 DIAGNOSIS — E785 Hyperlipidemia, unspecified: Secondary | ICD-10-CM | POA: Diagnosis not present

## 2023-05-01 DIAGNOSIS — R55 Syncope and collapse: Secondary | ICD-10-CM | POA: Diagnosis not present

## 2023-05-01 DIAGNOSIS — E1169 Type 2 diabetes mellitus with other specified complication: Secondary | ICD-10-CM | POA: Diagnosis not present

## 2023-05-01 NOTE — Progress Notes (Signed)
Cardiology Office Note:    Date:  05/01/2023   ID:  Kurt Aguilar, DOB 1946/04/24, MRN [id number]  PCP:  Judy Pimple, MD  [facility] Cardiologist:  Julien Nordmann, MD  [facility] Electrophysiologist:  None   Referring MD: Judy Pimple, MD   Chief Complaint: 2 month follow-up  History of Present Illness:    Kurt Aguilar is a 77 y.o. male with a hx of CAD s/p CABG in 1997 in [place], HTN, DM2, CKD, HLD, gout, aortic valve disease who presents for 2 month follow-up.    Patient was admitted to the hospital in January 2023 for non-STEMI.  Cardiac cath showed severe diffuse calcified coronary disease in the native arteries, diffusely diseased SVG to RCA, ostial to proximal 70% stenosis, patent sequential graft to first and second obtuse marginal, patent SVG to first diagonal, patent LIMA to LAD, heavily calcified aortic valve but without significant gradient.  No intervention was performed.  Enzyme elevation likely related to left coronary native disease in the septal perforator territory.  Echo at that time showed LVEF 60 to 65%, grade 1 diastolic dysfunction, elevated LVEDP, moderate reduced RV SF.   Patient was seen in May 2024 reporting syncope.  Echocardiogram and heart monitor were ordered. Heart monitor in May 2024 showed normal sinus rhythm, 2 runs of SVT, rare ectopy.  No triggered events reported.  Echo showed normal pump function, mild LVH, grade 1 diastolic dysfunction, moderately dilated left atrium, mild MR, mild AS, mean gradient 12 mmHg.  The patient was last seen 02/19/2023 reporting persistent syncope and dizziness when he stands up.  Orthostatics were negative.  Carotid ultrasound was ordered and Toprol was stopped.  Thigh-high stockings and abdominal monitor were recommended. Carotid US showed 1-39% bilateral stenosis.   Today, patient is overall doing better off the Toprol.  He denies any significant dizziness or lightheadedness.  He may experience some symptoms  when he turns his head.  No further falls.  He has been walking more and increased his distance every time.  He is also doing exercise in the water.  He has using a rollator to a cane.  He does report 2 episodes of angina relieved with nitroglycerin.  These occur when he is stressed out.  No lower leg edema, orthopnea, PND.  Past Medical History:  Diagnosis Date   Arthritis    Chronic kidney disease    Complication of anesthesia    constipation and inability to  urinate happened a few days after cataract surgery    Diabetes (HCC)    type 2    Diverticulitis    GERD (gastroesophageal reflux disease)    Gout    Heart attack (HCC)    Heart disease    Heart murmur    Hyperglycemia    Hyperlipidemia    Hypertension    Osteoporosis    Pneumonia    hx of BOOP followed by Dr Sherene Sires    Sleep apnea    cpapp - setting at 17     Past Surgical History:  Procedure Laterality Date   ACROMIO-CLAVICULAR JOINT REPAIR Left 05/05/2019   Procedure: SHOULDER ARTHROSCOPIC ASSISTED ACROMIO-CLAVICULAR JOINT REPAIR;  Surgeon: Jones Broom, MD;  Location: [facility];  Service: Orthopedics;  Laterality: Left;   CATARACT EXTRACTION Bilateral    CORONARY ARTERY BYPASS GRAFT     [place]   LEFT HEART CATH AND CORS/GRAFTS ANGIOGRAPHY N/A 09/19/2021   Procedure: LEFT HEART CATH AND CORS/GRAFTS ANGIOGRAPHY;  Surgeon: Verdis Prime  W, MD;  Location: [facility];  Service: Cardiovascular;  Laterality: N/A;   open heart surgery  09/09/1995-1998   5 bypass   REPAIR KNEE LIGAMENT     right   SHOULDER ARTHROSCOPY Left 05/05/2019   Procedure: ARTHROSCOPY SHOULDER;  Surgeon: Jones Broom, MD;  Location: [facility];  Service: Orthopedics;  Laterality: Left;   TONSILLECTOMY     TOTAL KNEE ARTHROPLASTY Left 05/29/2020   Procedure: LEFT TOTAL KNEE ARTHROPLASTY;  Surgeon: Marcene Corning, MD;  Location: [facility];  Service: Orthopedics;  Laterality: Left;    Current Medications: Current Meds  Medication Sig    allopurinol (ZYLOPRIM) 100 MG tablet TAKE 1 TABLET BY MOUTH TWICE A DAY   aspirin EC (ASPIRIN 81) 81 MG tablet Take 81 mg by mouth daily. Swallow whole.   Calcium Carbonate-Vit D-Min (CALCIUM-VITAMIN D-MINERALS) 600-800 MG-UNIT CHEW Chew 1 tablet by mouth daily.   colchicine 0.6 MG tablet Take 1 tablet (0.6 mg total) by mouth as needed (GOUT FLARE). TAKE 1 TABLET BY MOUTH TWICE A DAY WITH A MEAL AS NEEDED FOR ACUTE FLARE OF GOUT   Continuous Blood Gluc Receiver (FREESTYLE LIBRE 2 READER) DEVI USE AS INSTRUCTED TO CHECK BLOOD SUGARS.   Continuous Blood Gluc Sensor (FREESTYLE LIBRE 2 SENSOR) MISC 1 Device by Does not apply route every 14 (fourteen) days.   ezetimibe (ZETIA) 10 MG tablet TAKE 1 TABLET BY MOUTH EVERY DAY   famotidine (PEPCID) 20 MG tablet TAKE 1 TABLET BY MOUTH EVERYDAY AT BEDTIME   furosemide (LASIX) 20 MG tablet TAKE 1 TABLET BY MOUTH EVERY DAY   gabapentin (NEURONTIN) 100 MG capsule Take 100 mg by mouth daily.   Glucosamine HCl (GLUCOSAMINE PO) Take 1,500 mg by mouth 2 (two) times daily.    glucose blood (FREESTYLE LITE) test strip 1 each by Other route 4 (four) times daily. E11.65   insulin isophane & regular human KwikPen (NOVOLIN 70/30 KWIKPEN) (70-30) 100 UNIT/ML KwikPen Inject 45 Units into the skin daily with breakfast. And pen needles 1/day (Patient taking differently: Inject 35 Units into the skin daily with breakfast. And pen needles 1/day)   insulin lispro (HUMALOG KWIKPEN) 200 UNIT/ML KwikPen Inject 20 Units into the skin 2 (two) times daily before a meal. INJECT 12-15 UNITS INTO THE SKIN 2 (TWO) TIMES DAILY BEFORE A MEAL. (Patient taking differently: Inject 15 Units into the skin 2 (two) times daily before a meal. INJECT 12-15 UNITS INTO THE SKIN 2 (TWO) TIMES DAILY BEFORE A MEAL.)   Insulin Pen Needle (BD PEN NEEDLE NANO 2ND GEN) 32G X 4 MM MISC USE 3 TIMES A DAY   ketoconazole (NIZORAL) 2 % cream Apply 1 Application topically 2 (two) times daily.   Lancets (FREESTYLE)  lancets 1 EACH BY OTHER ROUTE 4 (FOUR) TIMES DAILY. E11.65   Lutein 20 MG CAPS Take 20 mg by mouth daily.   meclizine (ANTIVERT) 12.5 MG tablet TAKE 1-2 TABLETS BY MOUTH THREE TIMES A DAY AS NEEDED FOR DIZZINESS   Multiple Vitamins-Minerals (PRESERVISION AREDS 2 PO) Take by mouth.   nitroGLYCERIN (NITROSTAT) 0.4 MG SL tablet Place 1 tablet (0.4 mg total) under the tongue every 5 (five) minutes x 3 doses as needed for chest pain.   omeprazole (PRILOSEC) 40 MG capsule TAKE 1 CAPUSLE BY MOUTH 30- 60 MIN BEFORE YOUR FIRST AND LAST MEALS OF THE DAY   ondansetron (ZOFRAN) 8 MG tablet Take 1 tablet (8 mg total) by mouth every 8 (eight) hours as needed for nausea or  vomiting. From narcotic pain medicine   polyethylene glycol (MIRALAX / GLYCOLAX) 17 g packet Take 17 g by mouth daily.   predniSONE (DELTASONE) 10 MG tablet TAKE 2 TABLETS BY MOUTH UNTIL BETTER THEN TAPER TO 1/2 TABLET (Patient taking differently: Take 5 mg by mouth daily with breakfast.)   rosuvastatin (CRESTOR) 40 MG tablet TAKE 1 TABLET BY MOUTH EVERY DAY   Semaglutide, 2 MG/DOSE, (OZEMPIC, 2 MG/DOSE,) 8 MG/3ML SOPN Inject 2 mg into the skin once a week.     Allergies:   Oxycodone-acetaminophen and Oxycodone-acetaminophen   Social History   Socioeconomic History   Marital status: Married    Spouse name: Not on file   Number of children: 4   Years of education: 14   Highest education level: Not on file  Occupational History   Occupation: Sports administrator    Employer: RETIRED  Tobacco Use   Smoking status: Former    Current packs/day: 0.00    Average packs/day: 1 pack/day for 3.0 years (3.0 ttl pk-yrs)    Types: Cigars, Cigarettes    Start date: 11/25/2001    Quit date: 11/25/2004    Years since quitting: 18.4   Smokeless tobacco: Never   Tobacco comments:    occas. cigar quit 2006  Vaping Use   Vaping status: Never Used  Substance and Sexual Activity   Alcohol use: Yes    Alcohol/week: 1.0 standard drink of alcohol     Types: 1 Standard drinks or equivalent per week    Comment: rare   Drug use: No   Sexual activity: Never  Other Topics Concern   Not on file  Social History Narrative   Not on file   Social Determinants of Health   Financial Resource Strain: Low Risk  (04/30/2023)   Overall Financial Resource Strain (CARDIA)    Difficulty of Paying Living Expenses: Not hard at all  Food Insecurity: No Food Insecurity (04/30/2023)   Hunger Vital Sign    Worried About Running Out of Food in the Last Year: Never true    Ran Out of Food in the Last Year: Never true  Transportation Needs: No Transportation Needs (04/30/2023)   PRAPARE - Administrator, Civil Service (Medical): No    Lack of Transportation (Non-Medical): No  Physical Activity: Insufficiently Active (04/30/2023)   Exercise Vital Sign    Days of Exercise per Week: 2 days    Minutes of Exercise per Session: 30 min  Stress: No Stress Concern Present (04/30/2023)   [employer] of Occupational Health - Occupational Stress Questionnaire    Feeling of Stress : Not at all  Social Connections: Socially Integrated (04/30/2023)   Social Connection and Isolation Panel [NHANES]    Frequency of Communication with Friends and Family: More than three times a week    Frequency of Social Gatherings with Friends and Family: More than three times a week    Attends Religious Services: More than 4 times per year    Active Member of [employer] or Organizations: Yes    Attends Engineer, structural: More than 4 times per year    Marital Status: Married     Family History: The patient's family history includes Heart attack (age of onset: 87) in his son; Heart disease in his father and mother; Stroke in his father. There is no history of Kidney disease, Prostate cancer, or Diabetes.  ROS:   Please see the history of present illness.     All other systems  reviewed and are negative.  EKGs/Labs/Other Studies Reviewed:    The following  studies were reviewed today:  Echo 01/2023 1. Left ventricular ejection fraction, by estimation, is 60 to 65%. The  left ventricle has normal function. The left ventricle has no regional  wall motion abnormalities. There is mild left ventricular hypertrophy.  Left ventricular diastolic parameters  are consistent with Grade I diastolic dysfunction (impaired relaxation).   2. Right ventricular systolic function is normal. The right ventricular  size is normal.   3. Left atrial size was moderately dilated.   4. The mitral valve is degenerative. Mild mitral valve regurgitation.  Moderate mitral stenosis. The mean mitral valve gradient is 8.0 mmHg.  Moderate mitral annular calcification.   5. The aortic valve is calcified. Aortic valve regurgitation is not  visualized. Mild aortic valve stenosis. Aortic valve mean gradient  measures 12.0 mmHg.   Comparison(s): EF 60%, normal MV, calcified AOV with no stenosis.    Cardiac cath 09/2021 CONCLUSIONS: Severe diffuse calcified coronary disease of the native arteries with 100% proximal RCA, 90% ostial circumflex, 99% distal left main, severely and diffusely diseased proximal to mid LAD with threatened septal perforator. Diffusely diseased saphenous vein graft to the right coronary.  Ostial to proximal 70% stenosis. Widely patent sequential graft to first and second obtuse marginal Widely patent saphenous vein graft to the first diagonal Widely patent LIMA to the mid LAD Aortic valve is heavily calcified, was difficult to cross, but without significant gradient.  LVEDP 10 mmHg. Total contrast 70 cc   RECOMMENDATIONS:   Continue IV fluids for 18 to 24 hours.  He was given an additional 250 cc of saline as a bolus after identifying that LVEDP was 9 mmHg. Monitor kidney function closely.  Contrast exposure was 70 cc. The saphenous vein graft to the right coronary can be stented although it is not so critical that it should be causing enzyme  elevation.  Enzyme elevation likely related to left coronary native disease in the septal perforator territory.   Echo 09/2021  1. Left ventricular ejection fraction, by estimation, is 60 to 65%. The  left ventricle has normal function. The left ventricle has no regional  wall motion abnormalities. Left ventricular diastolic parameters are  consistent with Grade I diastolic  dysfunction (impaired relaxation). Elevated left ventricular end-diastolic  pressure.   2. Right ventricular systolic function is moderately reduced. The right  ventricular size is normal.   3. The mitral valve is normal in structure. No evidence of mitral valve  regurgitation. No evidence of mitral stenosis.   4. The AV is poorly visualized to comment on structure. There does appear  to be come calcification of the valve. . The aortic valve was not well  visualized. Aortic valve regurgitation is not visualized. No aortic  stenosis is present.      EKG:  EKG is not ordered today.    Recent Labs: 08/17/2022: B Natriuretic Peptide 205.2 04/15/2023: ALT 24; BUN 40; Creatinine, Ser 2.23; Hemoglobin 13.8; Platelets 242.0; Potassium 3.8; Sodium 142; TSH 3.47  Recent Lipid Panel    Component Value Date/Time   CHOL 138 04/15/2023 0829   TRIG 320.0 (H) 04/15/2023 0829   HDL 47.60 04/15/2023 0829   CHOLHDL 3 04/15/2023 0829   VLDL 64.0 (H) 04/15/2023 0829   LDLCALC 33 02/14/2022 0950   LDLDIRECT 63.0 04/15/2023 0829     Physical Exam:    VS:  BP 118/88   Pulse 89   Ht 5\' 10"  (  1.778 m)   Wt 246 lb (111.6 kg)   SpO2 97%   BMI 35.30 kg/m     Wt Readings from Last 3 Encounters:  05/01/23 246 lb (111.6 kg)  04/30/23 242 lb (109.8 kg)  04/22/23 246 lb (111.6 kg)     GEN:  Well nourished, well developed in no acute distress HEENT: Normal NECK: No JVD; No carotid bruits LYMPHATICS: No lymphadenopathy CARDIAC: RRR, no murmurs, rubs, gallops RESPIRATORY:  Clear to auscultation without rales, wheezing or  rhonchi  ABDOMEN: Soft, non-tender, non-distended MUSCULOSKELETAL:  No edema; No deformity  SKIN: Warm and dry NEUROLOGIC:  Alert and oriented x 3 PSYCHIATRIC:  Normal affect   ASSESSMENT:    1. Syncope and collapse   2. Pre-syncope   3. Coronary artery disease involving native coronary artery of native heart without angina pectoris   4. Hyperlipidemia associated with type 2 diabetes mellitus (HCC)    PLAN:    In order of problems listed above:  Syncope/pre-syncope Patient reports significant improvement of symptoms after stopping low-dose Toprol. He has occasional dizziness when he turns his head. Echo showed LVEF 60-65%, no WMA, G1DD, mild MR/AS. Heart monitor showed NSR, 2 runs of SVT, rare ectopy. Carotid US showed no significant stenosis. He is wearing compression socks. Patient is walking more and doing exercise in the pool with improvement of function and endurance.  CAD s/p remote CABG with stable angina Patient reports 2 episodes of angina since the last visit.  Angina seems to present when he is very stressed out.  Toprol stopped at the last visit due to Orthostatic symptoms with improvement.  I am reluctant to add on many antianginals due to orthostasis.  We discussed adding on Ranexa, but patient would rather pursue lifestyle changes (reduce stress) at this time.  No further ischemic work-up indicated today. Continue ASA, Crestor, Zetia, SL NTG.   Hypercholesterolemia LDL 63, TG 320, HDL 47, total chol 138. Continue Zetia and Crestor.   Disposition: Follow up in 4 month(s) with MD/APP    Signed, Brogan Martis David Stall, PA-C  05/01/2023 10:36 AM    [facility]

## 2023-05-01 NOTE — Patient Instructions (Signed)
Medication Instructions:  Your physician recommends that you continue on your current medications as directed. Please refer to the Current Medication list given to you today.  *If you need a refill on your cardiac medications before your next appointment, please call your pharmacy*  Lab Work: -None ordered  Testing/Procedures: -None ordered  Follow-Up: At [facility], you and your health needs are our priority.  As part of our continuing mission to provide you with exceptional heart care, we have created designated Provider Care Teams.  These Care Teams include your primary Cardiologist (physician) and Advanced Practice Providers (APPs -  Physician Assistants and Nurse Practitioners) who all work together to provide you with the care you need, when you need it.  Your next appointment:   4 month(s)  Provider:   Julien Nordmann, MD    Other Instructions -None

## 2023-05-06 ENCOUNTER — Ambulatory Visit (INDEPENDENT_AMBULATORY_CARE_PROVIDER_SITE_OTHER): Payer: PPO

## 2023-05-06 DIAGNOSIS — E538 Deficiency of other specified B group vitamins: Secondary | ICD-10-CM

## 2023-05-06 MED ORDER — CYANOCOBALAMIN 1000 MCG/ML IJ SOLN
1000.0000 ug | Freq: Once | INTRAMUSCULAR | Status: AC
Start: 2023-05-06 — End: 2023-05-06
  Administered 2023-05-06: 1000 ug via INTRAMUSCULAR

## 2023-05-06 NOTE — Progress Notes (Signed)
Per orders of Dr. Marne Tower, injection of vitamin b 12  given by Rena Isley in left deltoid. Patient tolerated injection well. Patient will make appointment for 1 week.   

## 2023-05-08 ENCOUNTER — Ambulatory Visit (INDEPENDENT_AMBULATORY_CARE_PROVIDER_SITE_OTHER): Payer: PPO | Admitting: Internal Medicine

## 2023-05-08 ENCOUNTER — Encounter: Payer: Self-pay | Admitting: Internal Medicine

## 2023-05-08 ENCOUNTER — Other Ambulatory Visit: Payer: Self-pay

## 2023-05-08 VITALS — BP 124/72 | HR 112 | Ht 70.0 in | Wt 245.6 lb

## 2023-05-08 DIAGNOSIS — E1165 Type 2 diabetes mellitus with hyperglycemia: Secondary | ICD-10-CM

## 2023-05-08 DIAGNOSIS — Z7985 Long-term (current) use of injectable non-insulin antidiabetic drugs: Secondary | ICD-10-CM

## 2023-05-08 DIAGNOSIS — Z794 Long term (current) use of insulin: Secondary | ICD-10-CM | POA: Diagnosis not present

## 2023-05-08 DIAGNOSIS — E1169 Type 2 diabetes mellitus with other specified complication: Secondary | ICD-10-CM

## 2023-05-08 DIAGNOSIS — E785 Hyperlipidemia, unspecified: Secondary | ICD-10-CM

## 2023-05-08 DIAGNOSIS — E1159 Type 2 diabetes mellitus with other circulatory complications: Secondary | ICD-10-CM | POA: Diagnosis not present

## 2023-05-08 LAB — POCT GLYCOSYLATED HEMOGLOBIN (HGB A1C): Hemoglobin A1C: 6.3 % — AB (ref 4.0–5.6)

## 2023-05-08 MED ORDER — OZEMPIC (2 MG/DOSE) 8 MG/3ML ~~LOC~~ SOPN
2.0000 mg | PEN_INJECTOR | SUBCUTANEOUS | 3 refills | Status: DC
  Filled 2023-05-08 – 2023-05-30 (×2): qty 9, 84d supply, fill #0
  Filled 2023-09-14: qty 3, 28d supply, fill #0

## 2023-05-08 MED ORDER — HUMALOG KWIKPEN 200 UNIT/ML ~~LOC~~ SOPN
10.0000 [IU] | PEN_INJECTOR | Freq: Two times a day (BID) | SUBCUTANEOUS | Status: DC
Start: 2023-05-08 — End: 2023-09-06

## 2023-05-08 MED ORDER — NOVOLIN 70/30 FLEXPEN (70-30) 100 UNIT/ML ~~LOC~~ SUPN: 30.0000 [IU] | PEN_INJECTOR | Freq: Every day | SUBCUTANEOUS | Status: DC

## 2023-05-08 NOTE — Progress Notes (Signed)
Patient ID: Kurt Aguilar, male   DOB: 12-06-1945, 77 y.o.   MRN: [id number]  HPI: Kurt Aguilar is a 77 y.o.-year-old male, returning for follow-up for DM2, dx in 2014, insulin-dependent in 2018-2019 and again since 2023, uncontrolled, with complications (CAD - s/p AMIs, diastolic CHF, CKD stage III, peripheral neuropathy). Pt. previously saw Dr. Everardo All, but last visit with me in ago. He is here with his wife who offers part of the history, especially regarding his diabetic medications, past medical history, and diet.  Interim history: He continues to have increased urination. No nausea, chest pain. Before last visit, she was admitted for dehydration due to viral gastroenteritis.  At that time he was taken off Jardiance.  At that time he was also off Ozempic due to lack of availability at the pharmacy.  He was on prednisone, also.  Sugars were higher.  Since last visit, he was able to start back on Ozempic.  Sugars improved. He is now also walking.   Reviewed HbA1c: Lab Results  Component Value Date   HGBA1C 7.9 (A) 09/05/2022   HGBA1C 6.6 (A) 05/06/2022   HGBA1C 6.7 (A) 01/09/2022   HGBA1C 8.0 (A) 11/14/2021   HGBA1C 9.5 (A) 09/13/2021   HGBA1C 6.5 (A) 08/15/2020   HGBA1C 6.6 (H) 05/17/2020   HGBA1C 6.3 (A) 02/10/2020   HGBA1C 5.8 (A) 08/15/2019   HGBA1C 6.3 03/24/2019   At last visit, he was on: - NPH/regular insulin 70/30 35 units in am (30 min before b'fast) >> 45 units in am (7 am) - Jardiance 10 mg after breakfast >> before breakfast- taken off by nephrology 08/2022 - Ozempic 2 mg weekly >> off in 08/2022  Since then, he change the regimen in 03.2024: - NPH/R 70/30 45 >> 55 >> 25 units in a.m. - NovoLog 12-15 >> 16-18 >> 15 units before L and D  - Back on Ozempic 2 mg weekly   Pt checks his sugars >4x a day and they are:  Previously:  Previously:  Lowest sugar was 66 >> 119 >> 50; he has hypoglycemia awareness at 70.  Highest sugar was 254 >> 370s >>  200.  Glucometer: Freestyle lite  Meals: - breakfast: eggs and bacon; eggs benedict; oats and toast with PB - lunch: pork chops + mashed potatoes + succotash or similar; pasta - dinner: spaghetti + salad + cookie  - +CKD-sees nephrology-Dr. Thedore Mins, last BUN/creatinine:  Lab Results  Component Value Date   BUN 40 (H) 04/15/2023   BUN 40 (H) 08/25/2022   CREATININE 2.23 (H) 04/15/2023   CREATININE 2.62 (H) 08/25/2022   11/28/2022: Protein to creatinine ratio 288 (25-148) Lab Results  Component Value Date   MICRALBCREAT 4.4 04/16/2022   MICRALBCREAT 23 04/01/2019   MICRALBCREAT 1.7 03/08/2018   MICRALBCREAT 1.2 02/13/2017   MICRALBCREAT 1.2 02/06/2016   MICRALBCREAT 1.7 08/30/2012   MICRALBCREAT 3.3 11/25/2010  On lisinopril 2.5 mg daily.  -+ HL; last set of lipids: Lab Results  Component Value Date   CHOL 138 04/15/2023   HDL 47.60 04/15/2023   LDLCALC 33 02/14/2022   LDLDIRECT 63.0 04/15/2023   TRIG 320.0 (H) 04/15/2023   CHOLHDL 3 04/15/2023  On Crestor 40 mg daily and Zetia 10 mg daily.  - last eye exam was 11/19/2022. No DR.   - no numbness and tingling in his feet.  Last foot exam 04/16/2022.  He has a h/o BOOP 2/2 Covid19 (on frequent steroid courses), HTN, OSA-on CPAP, osteoporosis, BPH, GERD, gout, history  of diverticulitis.  ROS: + see HPI  Past Medical History:  Diagnosis Date   Arthritis    Chronic kidney disease    Complication of anesthesia    constipation and inability to  urinate happened a few days after cataract surgery    Diabetes (HCC)    type 2    Diverticulitis    GERD (gastroesophageal reflux disease)    Gout    Heart attack (HCC)    Heart disease    Heart murmur    Hyperglycemia    Hyperlipidemia    Hypertension    Osteoporosis    Pneumonia    hx of BOOP followed by Dr Sherene Sires    Sleep apnea    cpapp - setting at 15    Past Surgical History:  Procedure Laterality Date   ACROMIO-CLAVICULAR JOINT REPAIR Left 05/05/2019   Procedure:  SHOULDER ARTHROSCOPIC ASSISTED ACROMIO-CLAVICULAR JOINT REPAIR;  Surgeon: Jones Broom, MD;  Location: [facility];  Service: Orthopedics;  Laterality: Left;   CATARACT EXTRACTION Bilateral    CORONARY ARTERY BYPASS GRAFT     [place]   LEFT HEART CATH AND CORS/GRAFTS ANGIOGRAPHY N/A 09/19/2021   Procedure: LEFT HEART CATH AND CORS/GRAFTS ANGIOGRAPHY;  Surgeon: Lyn Records, MD;  Location: [facility];  Service: Cardiovascular;  Laterality: N/A;   open heart surgery  09/09/1995-1998   5 bypass   REPAIR KNEE LIGAMENT     right   SHOULDER ARTHROSCOPY Left 05/05/2019   Procedure: ARTHROSCOPY SHOULDER;  Surgeon: Jones Broom, MD;  Location: [facility];  Service: Orthopedics;  Laterality: Left;   TONSILLECTOMY     TOTAL KNEE ARTHROPLASTY Left 05/29/2020   Procedure: LEFT TOTAL KNEE ARTHROPLASTY;  Surgeon: Marcene Corning, MD;  Location: [facility];  Service: Orthopedics;  Laterality: Left;   Social History   Socioeconomic History   Marital status: Married    Spouse name: Not on file   Number of children: 4   Years of education: 14   Highest education level: Not on file  Occupational History   Occupation: Sports administrator    Employer: RETIRED  Tobacco Use   Smoking status: Former    Current packs/day: 0.00    Average packs/day: 1 pack/day for 3.0 years (3.0 ttl pk-yrs)    Types: Cigars, Cigarettes    Start date: 11/25/2001    Quit date: 11/25/2004    Years since quitting: 18.4   Smokeless tobacco: Never   Tobacco comments:    occas. cigar quit 2006  Vaping Use   Vaping status: Never Used  Substance and Sexual Activity   Alcohol use: Yes    Alcohol/week: 1.0 standard drink of alcohol    Types: 1 Standard drinks or equivalent per week    Comment: rare   Drug use: No   Sexual activity: Never  Other Topics Concern   Not on file  Social History Narrative   Not on file   Social Determinants of Health   Financial Resource Strain: Low Risk  (04/30/2023)   Overall  Financial Resource Strain (CARDIA)    Difficulty of Paying Living Expenses: Not hard at all  Food Insecurity: No Food Insecurity (04/30/2023)   Hunger Vital Sign    Worried About Running Out of Food in the Last Year: Never true    Ran Out of Food in the Last Year: Never true  Transportation Needs: No Transportation Needs (04/30/2023)   PRAPARE - Transportation    Lack of Transportation (Medical): No    Lack  of Transportation (Non-Medical): No  Physical Activity: Insufficiently Active (04/30/2023)   Exercise Vital Sign    Days of Exercise per Week: 2 days    Minutes of Exercise per Session: 30 min  Stress: No Stress Concern Present (04/30/2023)   [employer] of Occupational Health - Occupational Stress Questionnaire    Feeling of Stress : Not at all  Social Connections: Socially Integrated (04/30/2023)   Social Connection and Isolation Panel [NHANES]    Frequency of Communication with Friends and Family: More than three times a week    Frequency of Social Gatherings with Friends and Family: More than three times a week    Attends Religious Services: More than 4 times per year    Active Member of [employer] or Organizations: Yes    Attends Engineer, structural: More than 4 times per year    Marital Status: Married  Catering manager Violence: Not At Risk (04/30/2023)   Humiliation, Afraid, Rape, and Kick questionnaire    Fear of Current or Ex-Partner: No    Emotionally Abused: No    Physically Abused: No    Sexually Abused: No   Current Outpatient Medications on File Prior to Visit  Medication Sig Dispense Refill   allopurinol (ZYLOPRIM) 100 MG tablet TAKE 1 TABLET BY MOUTH TWICE A DAY 180 tablet 3   aspirin EC (ASPIRIN 81) 81 MG tablet Take 81 mg by mouth daily. Swallow whole.     Calcium Carbonate-Vit D-Min (CALCIUM-VITAMIN D-MINERALS) 600-800 MG-UNIT CHEW Chew 1 tablet by mouth daily.     colchicine 0.6 MG tablet Take 1 tablet (0.6 mg total) by mouth as needed (GOUT FLARE).  TAKE 1 TABLET BY MOUTH TWICE A DAY WITH A MEAL AS NEEDED FOR ACUTE FLARE OF GOUT 20 tablet 0   Continuous Blood Gluc Receiver (FREESTYLE LIBRE 2 READER) DEVI USE AS INSTRUCTED TO CHECK BLOOD SUGARS. 1 each 0   Continuous Blood Gluc Sensor (FREESTYLE LIBRE 2 SENSOR) MISC 1 Device by Does not apply route every 14 (fourteen) days. 6 each 3   ezetimibe (ZETIA) 10 MG tablet TAKE 1 TABLET BY MOUTH EVERY DAY 90 tablet 0   famotidine (PEPCID) 20 MG tablet TAKE 1 TABLET BY MOUTH EVERYDAY AT BEDTIME 90 tablet 1   furosemide (LASIX) 20 MG tablet TAKE 1 TABLET BY MOUTH EVERY DAY 90 tablet 3   gabapentin (NEURONTIN) 100 MG capsule Take 100 mg by mouth daily.     Glucosamine HCl (GLUCOSAMINE PO) Take 1,500 mg by mouth 2 (two) times daily.      glucose blood (FREESTYLE LITE) test strip 1 each by Other route 4 (four) times daily. E11.65 400 strip 0   insulin isophane & regular human KwikPen (NOVOLIN 70/30 KWIKPEN) (70-30) 100 UNIT/ML KwikPen Inject 45 Units into the skin daily with breakfast. And pen needles 1/day (Patient taking differently: Inject 35 Units into the skin daily with breakfast. And pen needles 1/day) 30 mL 3   insulin lispro (HUMALOG KWIKPEN) 200 UNIT/ML KwikPen Inject 20 Units into the skin 2 (two) times daily before a meal. INJECT 12-15 UNITS INTO THE SKIN 2 (TWO) TIMES DAILY BEFORE A MEAL. (Patient taking differently: Inject 15 Units into the skin 2 (two) times daily before a meal. INJECT 12-15 UNITS INTO THE SKIN 2 (TWO) TIMES DAILY BEFORE A MEAL.) 45 mL 3   Insulin Pen Needle (BD PEN NEEDLE NANO 2ND GEN) 32G X 4 MM MISC USE 3 TIMES A DAY 300 each 1   ketoconazole (NIZORAL) 2 %  cream Apply 1 Application topically 2 (two) times daily.     Lancets (FREESTYLE) lancets 1 EACH BY OTHER ROUTE 4 (FOUR) TIMES DAILY. E11.65 400 each 0   Lutein 20 MG CAPS Take 20 mg by mouth daily.     meclizine (ANTIVERT) 12.5 MG tablet TAKE 1-2 TABLETS BY MOUTH THREE TIMES A DAY AS NEEDED FOR DIZZINESS     Multiple  Vitamins-Minerals (PRESERVISION AREDS 2 PO) Take by mouth.     nitroGLYCERIN (NITROSTAT) 0.4 MG SL tablet Place 1 tablet (0.4 mg total) under the tongue every 5 (five) minutes x 3 doses as needed for chest pain. 25 tablet 1   omeprazole (PRILOSEC) 40 MG capsule TAKE 1 CAPUSLE BY MOUTH 30- 60 MIN BEFORE YOUR FIRST AND LAST MEALS OF THE DAY 180 capsule 3   ondansetron (ZOFRAN) 8 MG tablet Take 1 tablet (8 mg total) by mouth every 8 (eight) hours as needed for nausea or vomiting. From narcotic pain medicine 15 tablet 0   polyethylene glycol (MIRALAX / GLYCOLAX) 17 g packet Take 17 g by mouth daily.     predniSONE (DELTASONE) 10 MG tablet TAKE 2 TABLETS BY MOUTH UNTIL BETTER THEN TAPER TO 1/2 TABLET (Patient taking differently: Take 5 mg by mouth daily with breakfast.) 100 tablet 1   rosuvastatin (CRESTOR) 40 MG tablet TAKE 1 TABLET BY MOUTH EVERY DAY 90 tablet 2   Semaglutide, 2 MG/DOSE, (OZEMPIC, 2 MG/DOSE,) 8 MG/3ML SOPN Inject 2 mg into the skin once a week. 9 mL 3   No current facility-administered medications on file prior to visit.   Allergies  Allergen Reactions   Oxycodone-Acetaminophen Other (See Comments)    Stops breathing; has tolerated Tylenol before   Oxycodone-Acetaminophen Anaphylaxis   Family History  Problem Relation Age of Onset   Heart disease Mother    Heart disease Father    Stroke Father    Heart attack Son 53   Kidney disease Neg Hx    Prostate cancer Neg Hx    Diabetes Neg Hx    PE: BP 124/72   Pulse (!) 112   Ht 5\' 10"  (1.778 m)   Wt 245 lb 9.6 oz (111.4 kg)   SpO2 95%   BMI 35.24 kg/m  Wt Readings from Last 3 Encounters:  05/08/23 245 lb 9.6 oz (111.4 kg)  05/01/23 246 lb (111.6 kg)  04/30/23 242 lb (109.8 kg)   Constitutional: overweight, in NAD Eyes: no exophthalmos ENT: no thyromegaly, no cervical lymphadenopathy Cardiovascular: Tachycardia,, RR, No RG, + 1/6 SEM, + B pitting LE edema Respiratory: CTA B Musculoskeletal: no deformities Skin: no  rashes Neurological: no tremor with outstretched hands Diabetic Foot Exam - Simple   Simple Foot Form Diabetic Foot exam was performed with the following findings: Yes 05/08/2023  1:49 PM  Visual Inspection No deformities, no ulcerations, no other skin breakdown bilaterally: Yes Sensation Testing Intact to touch and monofilament testing bilaterally: Yes Pulse Check Posterior Tibialis and Dorsalis pulse intact bilaterally: Yes Comments    ASSESSMENT: 1. DM2, insulin-dependent, uncontrolled, without long-term complications - CAD - diastolic CHF - CKD stage III - peripheral neuropathy  2.  Hyperlipidemia  PLAN:  1. Patient with longstanding, uncontrolled, type 2 diabetes, on injectable antidiabetic regimen with premixed insulin along with mealtime insulin started at last visit.  At that time, he was not able to use a GLP-1 receptor agonist due to availability at the pharmacy initially, and then due to price.  HbA1c at last visit was higher,  at 7.9%.  Sugars are higher as the day went by and we discussed about adding NovoLog to his NPH/regular insulin.  Approximately 3 months after our last visit, we had to increase the doses but he decreased the doses afterwards.  Per nephrology recommendations, we cannot use an SGLT2 inhibitor. CGM interpretation: -At today's visit, we reviewed his CGM downloads: It appears that 94% of values are in target range (goal >70%), while 6% are higher than 180 (goal <25%), and 0% are lower than 70 (goal <4%).  The calculated average blood sugar is 130.  The projected HbA1c for the next 3 months (GMI) is 6.4%. -Reviewing the CGM trends, sugars appear to be well-controlled, fluctuating within the target range, with only occasional mild hyperglycemic spikes after dinner.  He mentions that he was able to start Ozempic after our last visit, but he also continues with mealtime insulin, changed from NovoLog to Humalog.  He is using a fixed dose of Humalog now, 15 units  before lunch and dinner and we discussed about decreasing the dose slightly to avoid hypoglycemia especially overnight, and also vary the dose based on the size of the meal.  In the meantime, as sugars start increasing in the morning even without him eating, I advised him to use a slightly higher dose of NPH/regular insulin before breakfast.  For now we will continue Ozempic. - I suggested to:  Patient Instructions  Please increase: - NPH/regular insulin 70/30 30 units in am  Continue: - Ozempic 2 mg weekly  Decrease: - Humalog 10-14 units before lunch and dinner  Please return in 3-4 months.   - we checked his HbA1c: 6.3% (lower) - advised to check sugars at different times of the day - 4x a day, rotating check times - advised for yearly eye exams >> he is UTD - return to clinic in 3-4 months  2.  Hyperlipidemia -Latest lipid panel from earlier this month was reviewed: LDL and HDL at goal, triglycerides elevated: Lab Results  Component Value Date   CHOL 138 04/15/2023   HDL 47.60 04/15/2023   LDLCALC 33 02/14/2022   LDLDIRECT 63.0 04/15/2023   TRIG 320.0 (H) 04/15/2023   CHOLHDL 3 04/15/2023  -He continues Crestor 40 mg daily and Zetia 10 mg daily without side effects   Carlus Pavlov, MD PhD [facility] Endocrinology

## 2023-05-08 NOTE — Patient Instructions (Addendum)
Please increase: - NPH/regular insulin 70/30 30 units in am  Continue: - Ozempic 2 mg weekly  Decrease: - Humalog 10-14 units before lunch and dinner  Please return in 3-4 months.

## 2023-05-13 ENCOUNTER — Ambulatory Visit (INDEPENDENT_AMBULATORY_CARE_PROVIDER_SITE_OTHER): Payer: PPO

## 2023-05-13 DIAGNOSIS — E538 Deficiency of other specified B group vitamins: Secondary | ICD-10-CM | POA: Diagnosis not present

## 2023-05-13 MED ORDER — CYANOCOBALAMIN 1000 MCG/ML IJ SOLN
1000.0000 ug | Freq: Once | INTRAMUSCULAR | Status: AC
Start: 2023-05-13 — End: 2023-05-13
  Administered 2023-05-13: 1000 ug via INTRAMUSCULAR

## 2023-05-13 NOTE — Progress Notes (Signed)
Patient presented for B 12 injection given by Jessica Isley, CMA to right deltoid, patient voiced no concerns nor showed any signs of distress during injection.  

## 2023-05-14 NOTE — Addendum Note (Signed)
Addended by: Pollie Meyer on: 05/14/2023 11:12 AM   Modules accepted: Orders

## 2023-05-19 DIAGNOSIS — E1122 Type 2 diabetes mellitus with diabetic chronic kidney disease: Secondary | ICD-10-CM | POA: Diagnosis not present

## 2023-05-19 DIAGNOSIS — N184 Chronic kidney disease, stage 4 (severe): Secondary | ICD-10-CM | POA: Diagnosis not present

## 2023-05-19 DIAGNOSIS — D692 Other nonthrombocytopenic purpura: Secondary | ICD-10-CM | POA: Diagnosis not present

## 2023-05-19 DIAGNOSIS — I5030 Unspecified diastolic (congestive) heart failure: Secondary | ICD-10-CM | POA: Diagnosis not present

## 2023-05-19 DIAGNOSIS — Z7984 Long term (current) use of oral hypoglycemic drugs: Secondary | ICD-10-CM | POA: Diagnosis not present

## 2023-05-19 DIAGNOSIS — J8489 Other specified interstitial pulmonary diseases: Secondary | ICD-10-CM | POA: Diagnosis not present

## 2023-05-19 DIAGNOSIS — Z794 Long term (current) use of insulin: Secondary | ICD-10-CM | POA: Diagnosis not present

## 2023-05-19 DIAGNOSIS — I110 Hypertensive heart disease with heart failure: Secondary | ICD-10-CM | POA: Diagnosis not present

## 2023-05-19 DIAGNOSIS — Z6834 Body mass index (BMI) 34.0-34.9, adult: Secondary | ICD-10-CM | POA: Diagnosis not present

## 2023-05-28 DIAGNOSIS — L820 Inflamed seborrheic keratosis: Secondary | ICD-10-CM | POA: Diagnosis not present

## 2023-05-28 DIAGNOSIS — L570 Actinic keratosis: Secondary | ICD-10-CM | POA: Diagnosis not present

## 2023-05-28 DIAGNOSIS — L219 Seborrheic dermatitis, unspecified: Secondary | ICD-10-CM | POA: Diagnosis not present

## 2023-05-28 DIAGNOSIS — L821 Other seborrheic keratosis: Secondary | ICD-10-CM | POA: Diagnosis not present

## 2023-06-01 ENCOUNTER — Other Ambulatory Visit: Payer: Self-pay

## 2023-06-03 ENCOUNTER — Other Ambulatory Visit: Payer: Self-pay

## 2023-06-08 ENCOUNTER — Other Ambulatory Visit: Payer: Self-pay | Admitting: Family Medicine

## 2023-06-11 ENCOUNTER — Other Ambulatory Visit: Payer: Self-pay

## 2023-06-18 DIAGNOSIS — G4733 Obstructive sleep apnea (adult) (pediatric): Secondary | ICD-10-CM | POA: Diagnosis not present

## 2023-06-18 NOTE — Progress Notes (Signed)
HPI- male former smoker followed for OSA, bronchitis,Upper Airway Cough, complicated by DM2/ Hyperlipidemia, CAD/CABG/ RBBB, HTN,  GERD, hx BOOP, BPH,   ----------------------------------------------------------------------------------     06/19/22- 77 year old male former smoker(only cigars) followed for OSA, bronchitis,  Covid, BOOP ( infiltrates w high ESR- Dr Sherene Sires), complicated by DM2, CAD/CABG, Obesity, CKD, -Prednisone 30 mg daily, Zpak                   Does not have O2 CPAP 10-20 /[employer] Download-compliance 67%, AHI 6/ hr          Wife here Body weight today-244 lbs Covid vax-5 Moderna, 1 Phizer Flu vax-had RSV vax- had Continues Pulmonary f/u w/ Dr Sherene Sires.    Wife here Download reviewed. Prefers FFM but takes it off in sleep when it "itches". Reviewed pressure settings and potential to go to BIPAP or refit mask. Right now he is comfortable and change to reduce AHI from 6 probably not medically meaningful. He gets dyspneic moving around getting ready for bed and needs a little time to settle into his CPAP, but lowering his initial pressure won't change that. Looks just a little yellow to me in this light, but LFT ok in August. His wife credits his So-Clean machine for reduced incidence of infections. CXR 12/18/21-  IMPRESSION: Stable left basilar scarring and pleural thickening. Stable calcified pleural plaques left upper lobe. No acute abnormality is noted.  06/19/23- 77 year old male former smoker(only cigars) followed for OSA, bronchitis,  Covid, BOOP ( infiltrates w high ESR- Dr Sherene Sires), complicated by DM2, CAD/CABG, Obesity, CKD, -Prednisone 15 mg daily,  Gabapentin 300, Mucinex                    Does not have O2 CPAP 10-20 /[employer] Download-compliance   83%, AHI 4.4/hr             Wife here Body weight today-247 lbs Pending f/u with Dr Sherene Sires. Notes some dyspnea and chest congestion. Had flu vax.  CPAP download reviewed. Comfortable, but admits he take it off in his sleep.  No concern. He says heart rate routinely stays > 100 (107 today), followed by Cardiology. Aware of DOE hills and stairs. No acute change, but maybe some worse over time. We will get CXR today, to be available when he sees Dr Sherene Sires. He has weaned prednisone from 30 to 15 mg daily, and gabapentin for cough and pains to 300 mg daily. CXR 08/17/22- 1V IMPRESSION: Cardiomegaly with mild pulmonary vascular congestion. No evidence of pulmonary edema or large pleural effusion.  ROS-see HPI + = positive Constitutional:   No-   weight loss, night sweats, fevers, chills, fatigue, lassitude. HEENT:   No-  headaches, difficulty swallowing, tooth/dental problems, sore throat,       No-  sneezing, itching, ear ache, nasal congestion, post nasal drip,  CV:  No-   chest pain, orthopnea, PND, swelling in lower extremities, anasarca, dizziness, palpitations Resp: +shortness of breath with exertion or at rest.               +productive cough,  + non-productive cough,  No- coughing up of blood.             +change in color of mucus.  No- wheezing.   Skin: No-   rash or lesions. GI:  No-   heartburn, indigestion, abdominal pain, nausea, vomiting,  GU:  MS:  + joint pain or swelling. . Neuro-     nothing unusual Psych:  No- change in mood or affect. No depression or anxiety.  No memory loss.  OBJ General- Alert, Oriented, Affect-appropriate, Distress- none acute, +obese/ +cushingoid Skin- rash-none, lesions- none, excoriation- none< ?? Subtle icterus? Lymphadenopathy- none Head- atraumatic            Eyes- Gross vision intact, PERRLA, conjunctivae clear secretions            Ears- Hearing aid+            Nose- Clear, no-Septal dev, mucus, polyps, erosion, perforation             Throat- Mallampati II-III , mucosa clear , drainage- none, tonsils- atrophic Neck- flexible , trachea midline, no stridor , thyroid nl, carotid no bruit Chest - symmetrical excursion , unlabored           Heart/CV- +RapidRR , no  murmur , no gallop  , no rub, nl s1 s2                           - JVD- none , edema- none, stasis changes- none, varices- none           Lung- ?minimal basilar crackle, wheeze- none, cough- none, dullness-none, rub- none           Chest wall-  Abd- Br/ Gen/ Rectal- Not done, not indicated Extrem-  Neuro- grossly intact to observation

## 2023-06-19 ENCOUNTER — Ambulatory Visit: Payer: PPO | Admitting: Internal Medicine

## 2023-06-19 ENCOUNTER — Ambulatory Visit: Payer: PPO

## 2023-06-19 ENCOUNTER — Encounter: Payer: Self-pay | Admitting: Internal Medicine

## 2023-06-19 VITALS — BP 138/82 | HR 107 | Ht 70.5 in | Wt 247.0 lb

## 2023-06-19 DIAGNOSIS — J8489 Other specified interstitial pulmonary diseases: Secondary | ICD-10-CM | POA: Diagnosis not present

## 2023-06-19 DIAGNOSIS — R918 Other nonspecific abnormal finding of lung field: Secondary | ICD-10-CM

## 2023-06-19 DIAGNOSIS — R0609 Other forms of dyspnea: Secondary | ICD-10-CM

## 2023-06-19 DIAGNOSIS — G4733 Obstructive sleep apnea (adult) (pediatric): Secondary | ICD-10-CM | POA: Diagnosis not present

## 2023-06-19 DIAGNOSIS — I700 Atherosclerosis of aorta: Secondary | ICD-10-CM | POA: Diagnosis not present

## 2023-06-19 NOTE — Assessment & Plan Note (Signed)
Benefits from CPAP with adequate compliance and good control Plan- Continue auto 10-20

## 2023-06-19 NOTE — Assessment & Plan Note (Signed)
He will keep f/u appt with Dr Sherene Sires Plan- CXR today

## 2023-06-19 NOTE — Patient Instructions (Signed)
Order- CXR   dx BOOP, Dyspnea on exertion  We can continue CPAP 10-20  Please let us know if you have any new concerns before you see Dr Sherene Sires

## 2023-06-30 ENCOUNTER — Ambulatory Visit: Payer: PPO | Admitting: Internal Medicine

## 2023-07-06 ENCOUNTER — Encounter: Payer: Self-pay | Admitting: Internal Medicine

## 2023-07-06 ENCOUNTER — Ambulatory Visit: Payer: PPO | Admitting: Internal Medicine

## 2023-07-06 VITALS — BP 134/82 | HR 91 | Temp 97.8°F | Ht 70.5 in | Wt 253.2 lb

## 2023-07-06 DIAGNOSIS — R918 Other nonspecific abnormal finding of lung field: Secondary | ICD-10-CM

## 2023-07-06 MED ORDER — PREDNISONE 5 MG PO TABS: ORAL_TABLET | ORAL | 5 refills | Status: DC

## 2023-07-06 NOTE — Assessment & Plan Note (Signed)
Onset of symptoms feb 2017 with "photo neg pumonary edema pattern" Baseline PFTs 03/18/10  FVC  3.59 (80%) with ERV 67% and dlco 75 corrects to 106 - Allergy profile 12/19/15 >  Eos 0.2 /  IgE  89/ Pos Cat RAST only  12/19/2015  ESR 93 > started on prednisone 20 mg daily  HSP profile 12/19/15 >  Neg  - 01/07/2016 ESR down to 18 but only 30% improved symptomatically > rec continue 20 mg daily until better then 10 mg daily  - 02/01/2016 rec taper off x 6 weeks then ov > 05/16/2016 ESR = 58> restarted pred 06/03/16  - Collagen Vasc dz screen 07/29/2016  Neg  - PFT's  11/11/2016  FEV1 1.91 (56 % ) ratio 72  p 7 % improvement from saba p nothing prior to study with DLCO  42/43 % corrects to 77 % for alv volume but ERV 35%  - ESR  20 on 10 a/w 15 as  Of 11/11/2016 > rec try 10 mg daily  - Prednisone 5 mg daily as of 12/15/16 > taper to 5 mg qod by 02/10/2017 - 02/10/2017 ESR =  28  rec taper off x 2 weeks > no flare - 04/20/2017 ESR = 32 off pred since 02/2017  - 08/12/18  ESR = 16 with onset of cough  -  Cough recurred similar to Boop late November 2021  >  08/30/2020   Prednisone 10 mg x 2 daily until better then one daily until seen > cxr 09/26/2020 on 10 mg daily = wnl so rec taper off over 10 days  and see if cough flares  - 01/15/2021 flared again with taper to 5 mg so new floor of 10/5 and ceiling of 20 mg - 01/15/2021 rec bone density per PCP > osteopenia only - 05/29/2022   Walked on RA  x  2  lap(s) =  approx 350  ft  @ very slow pace, stopped due to tired/dizzy  with lowest 02 sats 94%  - 08/29/2022   Walked on RA  x  2   lap(s) =  approx 500  ft  @ slow/cane  pace, stopped due to cp, sob  with lowest 02 sats 94%  - cp and sob rapidly resolved at rest  - COVID infection 10/09/22 Start molnupiravir 5 day course/ pred up to 40 mf  The goal with a chronic steroid dependent illness is always arriving at the lowest effective dose that controls the disease/symptoms and not accepting a set "formula" which is based on  statistics or guidelines that don't always take into account patient  variability or the natural hx of the dz in every individual patient, which may well vary over time.  For now therefore I recommend the patient maintain  15 mg daily for now with ceiling of 40 mg as flares with each taper < 15 mg   Discussed in detail all the  indications, usual  risks and alternatives  relative to the benefits with patient who agrees to proceed with Rx as outlined.             Each maintenance medication was reviewed in detail including emphasizing most importantly the difference between maintenance and prns and under what circumstances the prns are to be triggered using an action plan format where appropriate.  Total time for H and P, chart review, counseling,   and generating customized AVS unique to this office visit / same day charting = 22 min

## 2023-07-06 NOTE — Patient Instructions (Signed)
Please schedule a follow up visit in 6 months but call sooner if needed  

## 2023-07-06 NOTE — Progress Notes (Signed)
Subjective:   Patient ID: Kurt Aguilar, male    DOB: [birth year]  MRN: [id number]    Brief patient profile:  61 yowm  never cigarette smoker (just some cigars)  after cabg in [place] started noting recurrent winter cough regardless of where located in Winter ([place]/ [place]) with typical acute episode in [place] in Feb 2017  This  cough  more dry less severe than previous >  abx and pred > improved but did not resolve and worse when stopped it so restarted pred/abx > dx as pneumonia with variable as dz > referred to pulmonary clinic 12/19/2015 by Dr Clifton Custard with 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   History of Present Illness  12/19/2015 1st [facility] office visit/ Kurt Aguilar   Chief Complaint  Patient presents with   Pulmonary Consult    Referred by Dr. Enos Fling. Pt c/o cough x 4 wks- non prod and worse in the evening and when he lies down. Talking and exertion are things that trigger the cough. He has been on pred x 2 and both times cough resolved and immediately returns once done with med. He also c/o SOB- gets winded just walking from room to room at home.   while on prednisone still some weak and sob but better cough and last dose at least one week and worse overall since off it assoc with sob room to room and weakness/ excess/ purulent sputum or mucus plugs / no unusual exp / no ctd.  Has new CPAP machine fall 2016  rec  Omeprazole 20 mg Take 30- 60 min before your first and last meals of the day  Prednisone 10  X 2 each day until 100% better then 10 mg daily  Dx is Brochilitis obliterans with organizing pneumonia vs eosiniphic pneumonia most likely diagnosis GERD  Diet   Symptoms onset Dec 4th 2022  ST  COVID 08/26/2021  acute visit  Prometh codeine    cough syrup Benzonatate perles- for cough Molnupiravir rx  and zpak  >>>  walking room to room at home/ cough never improved and pred rx up to 40 mg   Admit date:     09/17/2021   Discharge date: 09/24/21  Discharge  Physician: [name]     Discharge Diagnoses     Acute coronary syndrome / NSTEMI(HCC) Acute kidney injury on CKD stage IIIb Lactic acidosis Shortness of breath with coughing   Essential hypertension   Right bundle branch block   CAD (coronary artery disease)   DM2 (diabetes mellitus, type 2) (HCC)   Steroid dependent (HCC)   AKI (acute kidney injury) (HCC)   Stage 3b chronic kidney disease (CKD) [facility])         Hospital Course     Patient is a 77 year old male with history of CAD status post CABG in 1997, DM2, HTN, HLD, Boop, recent COVID-19 infection in December 2022, chronic steroid dependency presented to ED with mild intermittent chest pain for a month, worse with exertion, dyspnea.  Patient was found to have elevated troponins and was admitted for further work-up    NSTEMI [facility]) with known history of CAD status post CABG x5 in 1997 -2D echo showed no wall motion abnormality, normal EF, likely demand ischemia -Left heart cardiac cath showed multivessel native coronary artery diseasesaphenous vein to right coronary artery with 70% ostial to proximal stenosis, SVG to OM 1 and 2 patent, SVG to first diagonal patent, LIMA to LAD patent.  -Continue aspirin, beta-blocker, Crestor, Zetia,  Essential hypertension -BP stable, continue beta-blocker    Hyperkalemia -Resolved   Acute on chronic CKD stage IIIb, lactic acidosis -Creatinine 2.34 on admission, baseline around 1.8-1.9 -Creatinine improved, close to baseline.  Completed IV fluid hydration.      Shortness of breath with cough -Per patient 4 weeks, dry persistent cough, possibly due to COVID-19 in December (positive on 12/4), underlying history of BOOP -CXR with no pneumonia or effusion.  VQ scan negative for PE.  Normal BNP -Continues to feel winded with exertion, coughing spasms. -Continue prednisone 40 mg daily with a taper upon discharge. (Outpatient on 10 mg BID) -Continue scheduled nebs, incentive  spirometry, Tessalon Perles  -Home O2 evaluation done on 1/15, did not meet criteria for home O2   Hyperlipidemia -LDL 74 in 04/2021.  Continue Crestor 40 mg daily, Zetia 10 mg daily   Diabetes mellitus type 2, on long-term insulin with renal complications -Hemoglobin A1c 9.5 on 09/13/2021 --Continue insulin regimen, hopefully CBGs will continue to improve once prednisone taper is completed and back to maintenance dose   BOOP - follows Dr Sherene Sires outpatient, continue prednisone     Obesity Estimated body mass index is 32.39 kg/m as calculated from the following:   Height as of this encounter: 5\' 10"  (1.778 m).   Weight as of this encounter: 102.4 kg.      08/29/2022  f/u ov/Kurt Aguilar re: BOOP    maint on 10 mg pred / day   Chief Complaint  Patient presents with   Follow-up    SOB and heaviness in chest x 1 week.  In hospital for dehydration.  [facility] did increase prednisone to 40 mg x 3 days, then back to 10 mg a day.   Not able to get a deep breath.  Dyspnea:  worse on 10 mg vs 40 doing some bicycle but 10 min limit s desats Cough: mostly with talking/ dry quality daytime  Sleeping: cpap / 30 degrees  SABA use: none  02: none  Classic AP rx medically per cards / no change in pattern / intensity. Rec Ceiling on prednisone is 20 mg per day taken at breakfast - floor is 10 mg daily   AR    10/09/22 televisit/ NP/ covid Start molnupiravir 5 day course.    11/28/2022  f/u ov/Kurt Aguilar/ [facility] re: BOOP    maint on pred 20 mg x 2  on way  back down since covid  Chief Complaint  Patient presents with   Follow-up    SOB with exertion. Dry cough. Wheezing.   Dyspnea:  50 ft and stop but some days goes [place] street very slow pace Cough: non productive  Sleeping: cpap/ no 02 bed blocks plus pillows  SABA use: not using  02: not using  Rec Prednisone 10 mg ceiling of 40 mg and a floor of 5 mg daily  Gabapentin 100mg  three times daily and add 1 pill every  days until you find the right dose for cough but don't exceed 300 mg 4 x daily  Please schedule a follow up visit in 3 months but call sooner if needed     07/06/2023  f/u ov/Placida Cambre re: BOOP  maint on prednisone at 15 mg daily/ gabapentin 300mg  q hs    Chief Complaint  Patient presents with   Follow-up    Patients states he is out of breath when doing any kind of activity.    Dyspnea:  100 ft slow /  flat surface / walker / cane today  Cough: nonproductive / improved on gabapentin  Sleeping: cpap bed blocks s  resp cc on cpap  SABA use: not helping so not taking  02: none      No obvious day to day or daytime variability or assoc excess/ purulent sputum or mucus plugs or hemoptysis or cp or chest tightness, subjective wheeze or overt sinus or hb symptoms.    Also denies any obvious fluctuation of symptoms with weather or environmental changes or other aggravating or alleviating factors except as outlined above   No unusual exposure hx or h/o childhood pna/ asthma or knowledge of premature birth.  Current Allergies, Complete Past Medical History, Past Surgical History, Family History, and Social History were reviewed in [employer] record.  ROS  The following are not active complaints unless bolded Hoarseness, sore throat, dysphagia, dental problems, itching, sneezing,  nasal congestion or discharge of excess mucus or purulent secretions, ear ache,   fever, chills, sweats, unintended wt loss or wt gain, classically pleuritic or exertional cp,  orthopnea pnd or arm/hand swelling  or leg swelling, presyncope, palpitations, abdominal pain, anorexia, nausea, vomiting, diarrhea  or change in bowel habits or change in bladder habits, change in stools or change in urine, dysuria, hematuria,  rash, arthralgias, visual complaints, headache, numbness, weakness or ataxia or problems with walking or coordination,  change in mood or  memory.        Current Meds  Medication Sig    allopurinol (ZYLOPRIM) 100 MG tablet TAKE 1 TABLET BY MOUTH TWICE A DAY   aspirin EC (ASPIRIN 81) 81 MG tablet Take 81 mg by mouth daily. Swallow whole.   Calcium Carbonate-Vit D-Min (CALCIUM-VITAMIN D-MINERALS) 600-800 MG-UNIT CHEW Chew 1 tablet by mouth daily.   colchicine 0.6 MG tablet Take 1 tablet (0.6 mg total) by mouth as needed (GOUT FLARE). TAKE 1 TABLET BY MOUTH TWICE A DAY WITH A MEAL AS NEEDED FOR ACUTE FLARE OF GOUT   Continuous Blood Gluc Receiver (FREESTYLE LIBRE 2 READER) DEVI USE AS INSTRUCTED TO CHECK BLOOD SUGARS.   Continuous Blood Gluc Sensor (FREESTYLE LIBRE 2 SENSOR) MISC 1 Device by Does not apply route every 14 (fourteen) days.   ezetimibe (ZETIA) 10 MG tablet TAKE 1 TABLET BY MOUTH EVERY DAY   famotidine (PEPCID) 20 MG tablet TAKE 1 TABLET BY MOUTH EVERYDAY AT BEDTIME   furosemide (LASIX) 20 MG tablet TAKE 1 TABLET BY MOUTH EVERY DAY   gabapentin (NEURONTIN) 100 MG capsule Take 300 mg by mouth daily.   Glucosamine HCl (GLUCOSAMINE PO) Take 1,500 mg by mouth 2 (two) times daily.    glucose blood (FREESTYLE LITE) test strip 1 each by Other route 4 (four) times daily. E11.65   insulin isophane & regular human KwikPen (NOVOLIN 70/30 KWIKPEN) (70-30) 100 UNIT/ML KwikPen Inject 30 Units into the skin daily with breakfast. And pen needles 1/day   insulin lispro (HUMALOG KWIKPEN) 200 UNIT/ML KwikPen Inject 10-14 Units into the skin 2 (two) times daily before a meal. INJECT 12-15 UNITS INTO THE SKIN 2 (TWO) TIMES DAILY BEFORE A MEAL.   Insulin Pen Needle (BD PEN NEEDLE NANO 2ND GEN) 32G X 4 MM MISC USE 3 TIMES A DAY   ketoconazole (NIZORAL) 2 % cream Apply 1 Application topically 2 (two) times daily.   Lancets (FREESTYLE) lancets 1 EACH BY OTHER ROUTE 4 (FOUR) TIMES DAILY. E11.65   Lutein 20 MG CAPS Take 20 mg by mouth daily.  meclizine (ANTIVERT) 12.5 MG tablet TAKE 1-2 TABLETS BY MOUTH THREE TIMES A DAY AS NEEDED FOR DIZZINESS   Multiple Vitamins-Minerals (PRESERVISION AREDS 2  PO) Take by mouth.   nitroGLYCERIN (NITROSTAT) 0.4 MG SL tablet Place 1 tablet (0.4 mg total) under the tongue every 5 (five) minutes x 3 doses as needed for chest pain.   omeprazole (PRILOSEC) 40 MG capsule TAKE 1 CAPUSLE BY MOUTH 30- 60 MIN BEFORE YOUR FIRST AND LAST MEALS OF THE DAY   ondansetron (ZOFRAN) 8 MG tablet Take 1 tablet (8 mg total) by mouth every 8 (eight) hours as needed for nausea or vomiting. From narcotic pain medicine   polyethylene glycol (MIRALAX / GLYCOLAX) 17 g packet Take 17 g by mouth daily.   predniSONE (DELTASONE) 10 MG tablet TAKE 2 TABLETS BY MOUTH UNTIL BETTER THEN TAPER TO 1/2 TABLET (Patient taking differently: Take 5 mg by mouth daily with breakfast.)   rosuvastatin (CRESTOR) 40 MG tablet TAKE 1 TABLET BY MOUTH EVERY DAY   Semaglutide, 2 MG/DOSE, (OZEMPIC, 2 MG/DOSE,) 8 MG/3ML SOPN Inject 2 mg into the skin once a week.                  Objective:   Physical Exam   Wts  07/06/2023     253  11/28/2022       243 08/29/2022     244  05/29/2022       239 02/14/2022         238  11/15/2021        236  10/04/2021        230  05/22/2021        230   01/15/2021       231  09/26/2020        220  11/24/2019        206  09/20/2018        222  08/26/2018      222 07/29/2017      232  04/15/2017          243  02/10/2017          250  12/19/2016        250 11/11/2016          258  09/11/2016          252  07/29/2016      244  05/16/2016          238  02/01/2016        235  01/07/2016          228   12/19/15 229 lb 4 oz (103.987 kg)  12/19/15 229 lb 4 oz (103.987 kg)  12/19/15 227 lb (102.967 kg)     Vital signs reviewed  07/06/2023  - Note at rest 02 sats  93% on RA   General appearance:    cushingnoid amb wm       HEENT : Oropharynx  clear         NECK :  without  apparent JVD/ palpable Nodes/TM    LUNGS: no acc muscle use,  Nl contour chest with dry crackles bases bilaterally and cough on deep insp    CV:  RRR  no s3 or murmur or increase in P2, and no edema    ABD:  obese soft and nontender    MS:  Nl gait/ ext warm without deformities Or obvious joint restrictions  calf tenderness, cyanosis or clubbing    SKIN: warm and dry  without lesions    NEURO:  alert, approp, nl sensorium with  no motor or cerebellar deficits apparent.       Assessment & Plan:

## 2023-07-07 ENCOUNTER — Other Ambulatory Visit: Payer: Self-pay | Admitting: Cardiovascular Disease

## 2023-07-07 ENCOUNTER — Other Ambulatory Visit: Payer: Self-pay | Admitting: Family Medicine

## 2023-07-15 ENCOUNTER — Ambulatory Visit
Admission: RE | Admit: 2023-07-15 | Discharge: 2023-07-15 | Disposition: A | Discharge status: Home / Self Care | Payer: PPO | Source: Ambulatory Visit | Attending: Family Medicine | Admitting: Family Medicine

## 2023-07-15 DIAGNOSIS — M85852 Other specified disorders of bone density and structure, left thigh: Secondary | ICD-10-CM | POA: Diagnosis not present

## 2023-07-15 DIAGNOSIS — M8589 Other specified disorders of bone density and structure, multiple sites: Secondary | ICD-10-CM | POA: Insufficient documentation

## 2023-07-15 DIAGNOSIS — F1920 Other psychoactive substance dependence, uncomplicated: Secondary | ICD-10-CM | POA: Insufficient documentation

## 2023-07-15 DIAGNOSIS — M85851 Other specified disorders of bone density and structure, right thigh: Secondary | ICD-10-CM | POA: Diagnosis not present

## 2023-07-16 ENCOUNTER — Encounter: Payer: Self-pay | Admitting: *Deleted

## 2023-07-22 ENCOUNTER — Other Ambulatory Visit (INDEPENDENT_AMBULATORY_CARE_PROVIDER_SITE_OTHER): Payer: PPO

## 2023-07-22 DIAGNOSIS — E538 Deficiency of other specified B group vitamins: Secondary | ICD-10-CM | POA: Diagnosis not present

## 2023-07-22 LAB — VITAMIN B12: Vitamin B-12: 1378 pg/mL — ABNORMAL HIGH (ref 211–911)

## 2023-07-23 ENCOUNTER — Telehealth: Payer: Self-pay | Admitting: *Deleted

## 2023-07-23 NOTE — Telephone Encounter (Signed)
Pt notified of lab results and Dr. Royden Purl comments med list updated

## 2023-07-23 NOTE — Telephone Encounter (Signed)
-----   Message from [place] sent at 07/22/2023  6:57 PM EST ----- B12 level is great-in fact high  You can cut your dose to 500 mcg daily (please note on med list)  We will continue to watch it

## 2023-08-05 DIAGNOSIS — H353131 Nonexudative age-related macular degeneration, bilateral, early dry stage: Secondary | ICD-10-CM | POA: Diagnosis not present

## 2023-08-05 DIAGNOSIS — H26493 Other secondary cataract, bilateral: Secondary | ICD-10-CM | POA: Diagnosis not present

## 2023-08-05 DIAGNOSIS — E119 Type 2 diabetes mellitus without complications: Secondary | ICD-10-CM | POA: Diagnosis not present

## 2023-08-05 DIAGNOSIS — H04123 Dry eye syndrome of bilateral lacrimal glands: Secondary | ICD-10-CM | POA: Diagnosis not present

## 2023-08-05 LAB — HM DIABETES EYE EXAM

## 2023-08-17 DIAGNOSIS — I10 Essential (primary) hypertension: Secondary | ICD-10-CM | POA: Diagnosis not present

## 2023-08-17 DIAGNOSIS — E1122 Type 2 diabetes mellitus with diabetic chronic kidney disease: Secondary | ICD-10-CM | POA: Diagnosis not present

## 2023-08-17 DIAGNOSIS — N184 Chronic kidney disease, stage 4 (severe): Secondary | ICD-10-CM | POA: Diagnosis not present

## 2023-08-17 DIAGNOSIS — Q612 Polycystic kidney, adult type: Secondary | ICD-10-CM | POA: Diagnosis not present

## 2023-08-17 DIAGNOSIS — N189 Chronic kidney disease, unspecified: Secondary | ICD-10-CM | POA: Diagnosis not present

## 2023-08-17 DIAGNOSIS — E1129 Type 2 diabetes mellitus with other diabetic kidney complication: Secondary | ICD-10-CM | POA: Diagnosis not present

## 2023-08-18 DIAGNOSIS — N189 Chronic kidney disease, unspecified: Secondary | ICD-10-CM | POA: Diagnosis not present

## 2023-08-18 DIAGNOSIS — E1122 Type 2 diabetes mellitus with diabetic chronic kidney disease: Secondary | ICD-10-CM | POA: Diagnosis not present

## 2023-08-18 DIAGNOSIS — N184 Chronic kidney disease, stage 4 (severe): Secondary | ICD-10-CM | POA: Diagnosis not present

## 2023-08-18 DIAGNOSIS — I10 Essential (primary) hypertension: Secondary | ICD-10-CM | POA: Diagnosis not present

## 2023-08-18 DIAGNOSIS — Q612 Polycystic kidney, adult type: Secondary | ICD-10-CM | POA: Diagnosis not present

## 2023-08-18 DIAGNOSIS — E1129 Type 2 diabetes mellitus with other diabetic kidney complication: Secondary | ICD-10-CM | POA: Diagnosis not present

## 2023-08-20 ENCOUNTER — Other Ambulatory Visit: Payer: Self-pay

## 2023-08-20 ENCOUNTER — Inpatient Hospital Stay
Admission: EM | Admit: 2023-08-20 | Discharge: 2023-08-28 | DRG: 871 | Disposition: A | Discharge status: Skilled Nursing Facility | Payer: PPO | Attending: Internal Medicine | Admitting: Internal Medicine

## 2023-08-20 ENCOUNTER — Emergency Department: Payer: PPO

## 2023-08-20 DIAGNOSIS — R6520 Severe sepsis without septic shock: Secondary | ICD-10-CM | POA: Diagnosis present

## 2023-08-20 DIAGNOSIS — K219 Gastro-esophageal reflux disease without esophagitis: Secondary | ICD-10-CM | POA: Diagnosis not present

## 2023-08-20 DIAGNOSIS — I2510 Atherosclerotic heart disease of native coronary artery without angina pectoris: Secondary | ICD-10-CM | POA: Diagnosis present

## 2023-08-20 DIAGNOSIS — Z1152 Encounter for screening for COVID-19: Secondary | ICD-10-CM

## 2023-08-20 DIAGNOSIS — R0902 Hypoxemia: Secondary | ICD-10-CM | POA: Diagnosis not present

## 2023-08-20 DIAGNOSIS — N184 Chronic kidney disease, stage 4 (severe): Secondary | ICD-10-CM | POA: Diagnosis present

## 2023-08-20 DIAGNOSIS — M810 Age-related osteoporosis without current pathological fracture: Secondary | ICD-10-CM | POA: Diagnosis present

## 2023-08-20 DIAGNOSIS — Z66 Do not resuscitate: Secondary | ICD-10-CM | POA: Diagnosis present

## 2023-08-20 DIAGNOSIS — Z6836 Body mass index (BMI) 36.0-36.9, adult: Secondary | ICD-10-CM

## 2023-08-20 DIAGNOSIS — E1169 Type 2 diabetes mellitus with other specified complication: Secondary | ICD-10-CM | POA: Diagnosis not present

## 2023-08-20 DIAGNOSIS — N138 Other obstructive and reflux uropathy: Secondary | ICD-10-CM | POA: Diagnosis not present

## 2023-08-20 DIAGNOSIS — I129 Hypertensive chronic kidney disease with stage 1 through stage 4 chronic kidney disease, or unspecified chronic kidney disease: Secondary | ICD-10-CM | POA: Diagnosis not present

## 2023-08-20 DIAGNOSIS — T380X5A Adverse effect of glucocorticoids and synthetic analogues, initial encounter: Secondary | ICD-10-CM | POA: Diagnosis not present

## 2023-08-20 DIAGNOSIS — R000 Tachycardia, unspecified: Secondary | ICD-10-CM | POA: Diagnosis not present

## 2023-08-20 DIAGNOSIS — Z794 Long term (current) use of insulin: Secondary | ICD-10-CM

## 2023-08-20 DIAGNOSIS — I25708 Atherosclerosis of coronary artery bypass graft(s), unspecified, with other forms of angina pectoris: Secondary | ICD-10-CM | POA: Diagnosis present

## 2023-08-20 DIAGNOSIS — M109 Gout, unspecified: Secondary | ICD-10-CM | POA: Diagnosis not present

## 2023-08-20 DIAGNOSIS — I4510 Unspecified right bundle-branch block: Secondary | ICD-10-CM | POA: Diagnosis not present

## 2023-08-20 DIAGNOSIS — D631 Anemia in chronic kidney disease: Secondary | ICD-10-CM | POA: Diagnosis present

## 2023-08-20 DIAGNOSIS — J189 Pneumonia, unspecified organism: Secondary | ICD-10-CM | POA: Diagnosis not present

## 2023-08-20 DIAGNOSIS — E876 Hypokalemia: Secondary | ICD-10-CM | POA: Diagnosis not present

## 2023-08-20 DIAGNOSIS — I5032 Chronic diastolic (congestive) heart failure: Secondary | ICD-10-CM | POA: Diagnosis not present

## 2023-08-20 DIAGNOSIS — I10 Essential (primary) hypertension: Secondary | ICD-10-CM | POA: Diagnosis not present

## 2023-08-20 DIAGNOSIS — E1122 Type 2 diabetes mellitus with diabetic chronic kidney disease: Secondary | ICD-10-CM | POA: Diagnosis present

## 2023-08-20 DIAGNOSIS — I959 Hypotension, unspecified: Secondary | ICD-10-CM | POA: Diagnosis present

## 2023-08-20 DIAGNOSIS — J8489 Other specified interstitial pulmonary diseases: Secondary | ICD-10-CM | POA: Diagnosis not present

## 2023-08-20 DIAGNOSIS — D849 Immunodeficiency, unspecified: Secondary | ICD-10-CM | POA: Diagnosis not present

## 2023-08-20 DIAGNOSIS — N179 Acute kidney failure, unspecified: Secondary | ICD-10-CM | POA: Diagnosis not present

## 2023-08-20 DIAGNOSIS — Z823 Family history of stroke: Secondary | ICD-10-CM

## 2023-08-20 DIAGNOSIS — R918 Other nonspecific abnormal finding of lung field: Secondary | ICD-10-CM | POA: Diagnosis not present

## 2023-08-20 DIAGNOSIS — Z96652 Presence of left artificial knee joint: Secondary | ICD-10-CM | POA: Diagnosis present

## 2023-08-20 DIAGNOSIS — J398 Other specified diseases of upper respiratory tract: Secondary | ICD-10-CM | POA: Diagnosis not present

## 2023-08-20 DIAGNOSIS — G4733 Obstructive sleep apnea (adult) (pediatric): Secondary | ICD-10-CM | POA: Diagnosis present

## 2023-08-20 DIAGNOSIS — Z951 Presence of aortocoronary bypass graft: Secondary | ICD-10-CM | POA: Diagnosis not present

## 2023-08-20 DIAGNOSIS — Z8249 Family history of ischemic heart disease and other diseases of the circulatory system: Secondary | ICD-10-CM

## 2023-08-20 DIAGNOSIS — Z7401 Bed confinement status: Secondary | ICD-10-CM | POA: Diagnosis not present

## 2023-08-20 DIAGNOSIS — J45909 Unspecified asthma, uncomplicated: Secondary | ICD-10-CM | POA: Diagnosis not present

## 2023-08-20 DIAGNOSIS — R590 Localized enlarged lymph nodes: Secondary | ICD-10-CM | POA: Diagnosis not present

## 2023-08-20 DIAGNOSIS — I252 Old myocardial infarction: Secondary | ICD-10-CM

## 2023-08-20 DIAGNOSIS — N401 Enlarged prostate with lower urinary tract symptoms: Secondary | ICD-10-CM | POA: Diagnosis present

## 2023-08-20 DIAGNOSIS — Z87891 Personal history of nicotine dependence: Secondary | ICD-10-CM

## 2023-08-20 DIAGNOSIS — R0689 Other abnormalities of breathing: Secondary | ICD-10-CM | POA: Diagnosis not present

## 2023-08-20 DIAGNOSIS — R0989 Other specified symptoms and signs involving the circulatory and respiratory systems: Secondary | ICD-10-CM | POA: Diagnosis not present

## 2023-08-20 DIAGNOSIS — E872 Acidosis, unspecified: Secondary | ICD-10-CM | POA: Diagnosis not present

## 2023-08-20 DIAGNOSIS — N2581 Secondary hyperparathyroidism of renal origin: Secondary | ICD-10-CM | POA: Diagnosis present

## 2023-08-20 DIAGNOSIS — R0789 Other chest pain: Secondary | ICD-10-CM | POA: Insufficient documentation

## 2023-08-20 DIAGNOSIS — K5900 Constipation, unspecified: Secondary | ICD-10-CM | POA: Diagnosis not present

## 2023-08-20 DIAGNOSIS — I130 Hypertensive heart and chronic kidney disease with heart failure and stage 1 through stage 4 chronic kidney disease, or unspecified chronic kidney disease: Secondary | ICD-10-CM | POA: Diagnosis not present

## 2023-08-20 DIAGNOSIS — J9601 Acute respiratory failure with hypoxia: Secondary | ICD-10-CM | POA: Diagnosis present

## 2023-08-20 DIAGNOSIS — A419 Sepsis, unspecified organism: Secondary | ICD-10-CM | POA: Diagnosis not present

## 2023-08-20 DIAGNOSIS — Z7982 Long term (current) use of aspirin: Secondary | ICD-10-CM

## 2023-08-20 DIAGNOSIS — R0682 Tachypnea, not elsewhere classified: Secondary | ICD-10-CM | POA: Diagnosis not present

## 2023-08-20 DIAGNOSIS — R069 Unspecified abnormalities of breathing: Secondary | ICD-10-CM | POA: Diagnosis not present

## 2023-08-20 DIAGNOSIS — J90 Pleural effusion, not elsewhere classified: Secondary | ICD-10-CM | POA: Diagnosis not present

## 2023-08-20 DIAGNOSIS — Z79899 Other long term (current) drug therapy: Secondary | ICD-10-CM

## 2023-08-20 DIAGNOSIS — D84821 Immunodeficiency due to drugs: Secondary | ICD-10-CM | POA: Diagnosis not present

## 2023-08-20 DIAGNOSIS — E785 Hyperlipidemia, unspecified: Secondary | ICD-10-CM | POA: Diagnosis not present

## 2023-08-20 DIAGNOSIS — R531 Weakness: Secondary | ICD-10-CM | POA: Diagnosis not present

## 2023-08-20 DIAGNOSIS — Z7952 Long term (current) use of systemic steroids: Secondary | ICD-10-CM

## 2023-08-20 DIAGNOSIS — J929 Pleural plaque without asbestos: Secondary | ICD-10-CM | POA: Diagnosis not present

## 2023-08-20 DIAGNOSIS — Z7985 Long-term (current) use of injectable non-insulin antidiabetic drugs: Secondary | ICD-10-CM

## 2023-08-20 DIAGNOSIS — R059 Cough, unspecified: Secondary | ICD-10-CM | POA: Diagnosis present

## 2023-08-20 DIAGNOSIS — R54 Age-related physical debility: Secondary | ICD-10-CM | POA: Diagnosis present

## 2023-08-20 DIAGNOSIS — T508X5A Adverse effect of diagnostic agents, initial encounter: Secondary | ICD-10-CM | POA: Diagnosis present

## 2023-08-20 DIAGNOSIS — E669 Obesity, unspecified: Secondary | ICD-10-CM | POA: Diagnosis not present

## 2023-08-20 LAB — CBC WITH DIFFERENTIAL/PLATELET
Abs Immature Granulocytes: 0.14 10*3/uL — ABNORMAL HIGH (ref 0.00–0.07)
Basophils Absolute: 0.1 10*3/uL (ref 0.0–0.1)
Basophils Relative: 1 %
Eosinophils Absolute: 0 10*3/uL (ref 0.0–0.5)
Eosinophils Relative: 0 %
HCT: 38.7 % — ABNORMAL LOW (ref 39.0–52.0)
Hemoglobin: 12.6 g/dL — ABNORMAL LOW (ref 13.0–17.0)
Immature Granulocytes: 1 %
Lymphocytes Relative: 28 %
Lymphs Abs: 4.7 10*3/uL — ABNORMAL HIGH (ref 0.7–4.0)
MCH: 33.2 pg (ref 26.0–34.0)
MCHC: 32.6 g/dL (ref 30.0–36.0)
MCV: 102.1 fL — ABNORMAL HIGH (ref 80.0–100.0)
Monocytes Absolute: 1 10*3/uL (ref 0.1–1.0)
Monocytes Relative: 6 %
Neutro Abs: 11.2 10*3/uL — ABNORMAL HIGH (ref 1.7–7.7)
Neutrophils Relative %: 64 %
Platelets: 164 10*3/uL (ref 150–400)
RBC: 3.79 MIL/uL — ABNORMAL LOW (ref 4.22–5.81)
RDW: 15.8 % — ABNORMAL HIGH (ref 11.5–15.5)
Smear Review: NORMAL
WBC: 17 10*3/uL — ABNORMAL HIGH (ref 4.0–10.5)
nRBC: 0.8 % — ABNORMAL HIGH (ref 0.0–0.2)

## 2023-08-20 LAB — LACTIC ACID, PLASMA
Lactic Acid, Venous: 3.6 mmol/L (ref 0.5–1.9)
Lactic Acid, Venous: 3.5 mmol/L (ref 0.5–1.9)
Lactic Acid, Venous: 3.1 mmol/L (ref 0.5–1.9)

## 2023-08-20 LAB — COMPREHENSIVE METABOLIC PANEL
ALT: 32 U/L (ref 0–44)
AST: 28 U/L (ref 15–41)
Albumin: 3.4 g/dL — ABNORMAL LOW (ref 3.5–5.0)
Alkaline Phosphatase: 40 U/L (ref 38–126)
Anion gap: 12 (ref 5–15)
BUN: 57 mg/dL — ABNORMAL HIGH (ref 8–23)
CO2: 25 mmol/L (ref 22–32)
Calcium: 8.6 mg/dL — ABNORMAL LOW (ref 8.9–10.3)
Chloride: 102 mmol/L (ref 98–111)
Creatinine, Ser: 3.07 mg/dL — ABNORMAL HIGH (ref 0.61–1.24)
GFR, Estimated: 20 mL/min — ABNORMAL LOW (ref >60)
Glucose, Bld: 153 mg/dL — ABNORMAL HIGH (ref 70–99)
Potassium: 3.2 mmol/L — ABNORMAL LOW (ref 3.5–5.1)
Sodium: 139 mmol/L (ref 135–145)
Total Bilirubin: 2.2 mg/dL — ABNORMAL HIGH (ref <1.2)
Total Protein: 6 g/dL — ABNORMAL LOW (ref 6.5–8.1)

## 2023-08-20 LAB — BLOOD GAS, VENOUS
Acid-Base Excess: 1.2 mmol/L (ref 0.0–2.0)
Bicarbonate: 26.6 mmol/L (ref 20.0–28.0)
O2 Content: 4 L/min
O2 Saturation: 60.5 %
Patient temperature: 37
pCO2, Ven: 44 mm[Hg] (ref 44–60)
pH, Ven: 7.39 (ref 7.25–7.43)
pO2, Ven: 34 mm[Hg] (ref 32–45)

## 2023-08-20 LAB — CBG MONITORING, ED
Glucose-Capillary: 165 mg/dL — ABNORMAL HIGH (ref 70–99)
Glucose-Capillary: 205 mg/dL — ABNORMAL HIGH (ref 70–99)

## 2023-08-20 LAB — PROCALCITONIN: Procalcitonin: 3.85 ng/mL

## 2023-08-20 LAB — APTT: aPTT: 33 s (ref 24–36)

## 2023-08-20 LAB — RESP PANEL BY RT-PCR (RSV, FLU A&B, COVID)  RVPGX2
Influenza A by PCR: NEGATIVE
Influenza B by PCR: NEGATIVE
Resp Syncytial Virus by PCR: NEGATIVE
SARS Coronavirus 2 by RT PCR: NEGATIVE

## 2023-08-20 LAB — MAGNESIUM: Magnesium: 1.9 mg/dL (ref 1.7–2.4)

## 2023-08-20 LAB — GLUCOSE, CAPILLARY: Glucose-Capillary: 186 mg/dL — ABNORMAL HIGH (ref 70–99)

## 2023-08-20 LAB — PROTIME-INR
INR: 1.2 (ref 0.8–1.2)
Prothrombin Time: 15.7 s — ABNORMAL HIGH (ref 11.4–15.2)

## 2023-08-20 LAB — MRSA NEXT GEN BY PCR, NASAL: MRSA by PCR Next Gen: NOT DETECTED

## 2023-08-20 MED ORDER — LIDOCAINE 5 % EX PTCH
1.0000 | MEDICATED_PATCH | CUTANEOUS | Status: AC
  Administered 2023-08-20: 1 via TRANSDERMAL
  Administered 2023-08-21 – 2023-08-22 (×2): 2 via TRANSDERMAL
  Filled 2023-08-20 (×2): qty 2
  Filled 2023-08-20: qty 1

## 2023-08-20 MED ORDER — ROSUVASTATIN CALCIUM 10 MG PO TABS
40.0000 mg | ORAL_TABLET | Freq: Every day | ORAL | Status: DC
  Administered 2023-08-21 – 2023-08-28 (×8): 40 mg via ORAL
  Filled 2023-08-20 (×2): qty 2
  Filled 2023-08-20 (×5): qty 4
  Filled 2023-08-20: qty 2
  Filled 2023-08-20 (×3): qty 4

## 2023-08-20 MED ORDER — METOPROLOL TARTRATE 5 MG/5ML IV SOLN
5.0000 mg | INTRAVENOUS | Status: DC | PRN
  Administered 2023-08-25: 5 mg via INTRAVENOUS
  Filled 2023-08-20: qty 5

## 2023-08-20 MED ORDER — HYDROCOD POLI-CHLORPHE POLI ER 10-8 MG/5ML PO SUER: 5.0000 mL | Freq: Every evening | ORAL | Status: AC | PRN

## 2023-08-20 MED ORDER — DIPHENHYDRAMINE HCL 50 MG/ML IJ SOLN: 25.0000 mg | Freq: Four times a day (QID) | INTRAMUSCULAR | Status: DC | PRN

## 2023-08-20 MED ORDER — VANCOMYCIN HCL IN DEXTROSE 1-5 GM/200ML-% IV SOLN
1000.0000 mg | Freq: Once | INTRAVENOUS | Status: AC
Start: 2023-08-20 — End: 2023-08-20
  Administered 2023-08-20: 1000 mg via INTRAVENOUS
  Filled 2023-08-20: qty 200

## 2023-08-20 MED ORDER — MORPHINE SULFATE (PF) 2 MG/ML IV SOLN: 2.0000 mg | INTRAVENOUS | Status: DC | PRN

## 2023-08-20 MED ORDER — VANCOMYCIN HCL IN DEXTROSE 1-5 GM/200ML-% IV SOLN
1000.0000 mg | Freq: Once | INTRAVENOUS | Status: AC
  Administered 2023-08-20: 1000 mg via INTRAVENOUS

## 2023-08-20 MED ORDER — GUAIFENESIN 100 MG/5ML PO LIQD
5.0000 mL | ORAL | Status: AC | PRN
  Administered 2023-08-21 – 2023-08-23 (×4): 5 mL via ORAL
  Filled 2023-08-20 (×4): qty 10

## 2023-08-20 MED ORDER — LACTATED RINGERS IV BOLUS (SEPSIS)
1000.0000 mL | Freq: Once | INTRAVENOUS | Status: AC
  Administered 2023-08-20: 1000 mL via INTRAVENOUS

## 2023-08-20 MED ORDER — PANTOPRAZOLE SODIUM 40 MG IV SOLR
40.0000 mg | Freq: Two times a day (BID) | INTRAVENOUS | Status: AC | PRN
Start: 2023-08-20 — End: 2023-08-23

## 2023-08-20 MED ORDER — POLYETHYLENE GLYCOL 3350 17 G PO PACK: 17.0000 g | PACK | Freq: Two times a day (BID) | ORAL | Status: AC | PRN

## 2023-08-20 MED ORDER — CHLORHEXIDINE GLUCONATE CLOTH 2 % EX PADS
6.0000 | MEDICATED_PAD | Freq: Every day | CUTANEOUS | Status: DC
  Administered 2023-08-21 – 2023-08-24 (×4): 6 via TOPICAL

## 2023-08-20 MED ORDER — SODIUM CHLORIDE 0.9 % IV SOLN
1.0000 g | Freq: Once | INTRAVENOUS | Status: AC
  Administered 2023-08-20: 1 g via INTRAVENOUS
  Filled 2023-08-20: qty 20

## 2023-08-20 MED ORDER — INSULIN ASPART PROT & ASPART (70-30 MIX) 100 UNIT/ML ~~LOC~~ SUSP
30.0000 [IU] | Freq: Every day | SUBCUTANEOUS | Status: DC
  Administered 2023-08-21 – 2023-08-28 (×8): 30 [IU] via SUBCUTANEOUS
  Filled 2023-08-20 (×4): qty 10

## 2023-08-20 MED ORDER — ASPIRIN 81 MG PO TBEC
81.0000 mg | DELAYED_RELEASE_TABLET | Freq: Every day | ORAL | Status: DC
  Administered 2023-08-21 – 2023-08-28 (×8): 81 mg via ORAL
  Filled 2023-08-20 (×8): qty 1

## 2023-08-20 MED ORDER — SODIUM CHLORIDE 0.9 % IV BOLUS
500.0000 mL | Freq: Once | INTRAVENOUS | Status: AC
  Administered 2023-08-20: 500 mL via INTRAVENOUS

## 2023-08-20 MED ORDER — SENNOSIDES-DOCUSATE SODIUM 8.6-50 MG PO TABS
1.0000 | ORAL_TABLET | Freq: Every day | ORAL | Status: DC
  Administered 2023-08-20 – 2023-08-27 (×8): 1 via ORAL
  Filled 2023-08-20 (×7): qty 1

## 2023-08-20 MED ORDER — ACETAMINOPHEN 325 MG PO TABS
650.0000 mg | ORAL_TABLET | Freq: Four times a day (QID) | ORAL | Status: DC | PRN
  Administered 2023-08-22: 650 mg via ORAL
  Filled 2023-08-20: qty 2

## 2023-08-20 MED ORDER — PREDNISONE 20 MG PO TABS
20.0000 mg | ORAL_TABLET | Freq: Every day | ORAL | Status: DC
  Administered 2023-08-20 – 2023-08-28 (×9): 20 mg via ORAL
  Filled 2023-08-20 (×9): qty 1

## 2023-08-20 MED ORDER — FAMOTIDINE 20 MG PO TABS
20.0000 mg | ORAL_TABLET | Freq: Every day | ORAL | Status: DC
Start: 2023-08-20 — End: 2023-08-28
  Administered 2023-08-20 – 2023-08-27 (×8): 20 mg via ORAL
  Filled 2023-08-20 (×8): qty 1

## 2023-08-20 MED ORDER — VANCOMYCIN HCL 500 MG/100ML IV SOLN
500.0000 mg | Freq: Once | INTRAVENOUS | Status: AC
Start: 2023-08-20 — End: 2023-08-20
  Administered 2023-08-20: 500 mg via INTRAVENOUS
  Filled 2023-08-20: qty 100

## 2023-08-20 MED ORDER — SODIUM CHLORIDE 0.9 % IV SOLN
500.0000 mg | Freq: Two times a day (BID) | INTRAVENOUS | Status: DC
  Administered 2023-08-20 – 2023-08-24 (×8): 500 mg via INTRAVENOUS
  Filled 2023-08-20 (×4): qty 10
  Filled 2023-08-20: qty 500
  Filled 2023-08-20 (×3): qty 10

## 2023-08-20 MED ORDER — GABAPENTIN 300 MG PO CAPS
300.0000 mg | ORAL_CAPSULE | Freq: Every day | ORAL | Status: DC
Start: 2023-08-21 — End: 2023-08-28
  Administered 2023-08-21 – 2023-08-27 (×7): 300 mg via ORAL
  Filled 2023-08-20 (×7): qty 1

## 2023-08-20 MED ORDER — VANCOMYCIN HCL IN DEXTROSE 1-5 GM/200ML-% IV SOLN
1000.0000 mg | Freq: Once | INTRAVENOUS | Status: DC
  Filled 2023-08-20: qty 200

## 2023-08-20 MED ORDER — ALLOPURINOL 100 MG PO TABS
100.0000 mg | ORAL_TABLET | Freq: Two times a day (BID) | ORAL | Status: DC
  Administered 2023-08-21 – 2023-08-27 (×14): 100 mg via ORAL
  Filled 2023-08-20 (×16): qty 1

## 2023-08-20 MED ORDER — MECLIZINE HCL 25 MG PO TABS: 12.5000 mg | ORAL_TABLET | Freq: Three times a day (TID) | ORAL | Status: DC | PRN

## 2023-08-20 MED ORDER — INSULIN ASPART 100 UNIT/ML IJ SOLN
0.0000 [IU] | Freq: Every day | INTRAMUSCULAR | Status: DC
  Administered 2023-08-20 – 2023-08-27 (×3): 2 [IU] via SUBCUTANEOUS
  Filled 2023-08-20 (×4): qty 1

## 2023-08-20 MED ORDER — HEPARIN SODIUM (PORCINE) 5000 UNIT/ML IJ SOLN
5000.0000 [IU] | Freq: Three times a day (TID) | INTRAMUSCULAR | Status: DC
  Administered 2023-08-20 – 2023-08-28 (×22): 5000 [IU] via SUBCUTANEOUS
  Filled 2023-08-20 (×22): qty 1

## 2023-08-20 MED ORDER — ONDANSETRON HCL 4 MG PO TABS: 4.0000 mg | ORAL_TABLET | Freq: Four times a day (QID) | ORAL | Status: DC | PRN

## 2023-08-20 MED ORDER — VITAMIN B-12 1000 MCG PO TABS
500.0000 ug | ORAL_TABLET | Freq: Every day | ORAL | Status: DC
  Administered 2023-08-21 – 2023-08-28 (×8): 500 ug via ORAL
  Filled 2023-08-20 (×8): qty 1

## 2023-08-20 MED ORDER — SENNOSIDES-DOCUSATE SODIUM 8.6-50 MG PO TABS
1.0000 | ORAL_TABLET | Freq: Every evening | ORAL | Status: DC | PRN
Start: 2023-08-20 — End: 2023-08-20

## 2023-08-20 MED ORDER — ONDANSETRON HCL 4 MG/2ML IJ SOLN
4.0000 mg | Freq: Four times a day (QID) | INTRAMUSCULAR | Status: DC | PRN
Start: 2023-08-20 — End: 2023-08-28
  Administered 2023-08-26: 4 mg via INTRAVENOUS
  Filled 2023-08-20: qty 2

## 2023-08-20 MED ORDER — NITROGLYCERIN 0.4 MG SL SUBL: 0.4000 mg | SUBLINGUAL_TABLET | SUBLINGUAL | Status: DC | PRN

## 2023-08-20 MED ORDER — LACTATED RINGERS IV SOLN: INTRAVENOUS | Status: DC

## 2023-08-20 MED ORDER — VANCOMYCIN VARIABLE DOSE PER UNSTABLE RENAL FUNCTION (PHARMACIST DOSING): Status: DC

## 2023-08-20 MED ORDER — IPRATROPIUM-ALBUTEROL 0.5-2.5 (3) MG/3ML IN SOLN
6.0000 mL | Freq: Once | RESPIRATORY_TRACT | Status: AC
  Administered 2023-08-20: 6 mL via RESPIRATORY_TRACT
  Filled 2023-08-20: qty 6

## 2023-08-20 MED ORDER — SODIUM CHLORIDE 0.9 % IV SOLN
500.0000 mg | INTRAVENOUS | Status: AC
  Administered 2023-08-20 – 2023-08-24 (×5): 500 mg via INTRAVENOUS
  Filled 2023-08-20 (×5): qty 5

## 2023-08-20 MED ORDER — HYDRALAZINE HCL 20 MG/ML IJ SOLN: 5.0000 mg | Freq: Four times a day (QID) | INTRAMUSCULAR | Status: AC | PRN

## 2023-08-20 MED ORDER — EZETIMIBE 10 MG PO TABS
10.0000 mg | ORAL_TABLET | Freq: Every day | ORAL | Status: DC
  Administered 2023-08-21 – 2023-08-28 (×8): 10 mg via ORAL
  Filled 2023-08-20 (×8): qty 1

## 2023-08-20 MED ORDER — ORAL CARE MOUTH RINSE: 15.0000 mL | OROMUCOSAL | Status: DC | PRN

## 2023-08-20 MED ORDER — INSULIN ASPART 100 UNIT/ML IJ SOLN
0.0000 [IU] | Freq: Three times a day (TID) | INTRAMUSCULAR | Status: DC
  Administered 2023-08-20 – 2023-08-21 (×2): 2 [IU] via SUBCUTANEOUS
  Administered 2023-08-21: 1 [IU] via SUBCUTANEOUS
  Administered 2023-08-21: 3 [IU] via SUBCUTANEOUS
  Administered 2023-08-22 – 2023-08-23 (×3): 1 [IU] via SUBCUTANEOUS
  Administered 2023-08-23: 2 [IU] via SUBCUTANEOUS
  Administered 2023-08-24: 3 [IU] via SUBCUTANEOUS
  Administered 2023-08-24: 1 [IU] via SUBCUTANEOUS
  Administered 2023-08-25: 2 [IU] via SUBCUTANEOUS
  Administered 2023-08-25: 1 [IU] via SUBCUTANEOUS
  Administered 2023-08-25 – 2023-08-26 (×3): 2 [IU] via SUBCUTANEOUS
  Filled 2023-08-20 (×15): qty 1

## 2023-08-20 NOTE — ED Notes (Addendum)
MD made aware of decrease in BP. Manual BP 82/50 at this time. MD made aware.

## 2023-08-20 NOTE — Assessment & Plan Note (Signed)
Senna docusate 1 tablet nightly ordered on admission MiraLAX 17 g p.o. twice daily as needed for moderate constipation, 5 days ordered

## 2023-08-20 NOTE — Assessment & Plan Note (Addendum)
1-2 PRN lidocaine patches ordered Morphine 2 mg IV every 4 hours as needed for severe pain, 20 hours ordered

## 2023-08-20 NOTE — Assessment & Plan Note (Signed)
Guaifenesin every 4 hours as needed to loosen phlegm, 3 days ordered Tussionex nightly as needed for cough, 2 days ordered

## 2023-08-20 NOTE — ED Provider Notes (Signed)
[facility] Provider Note   Event Date/Time   First MD Initiated Contact with Patient 08/20/23 1046     (approximate) History  Shortness of Breath  HPI Kurt Aguilar is a 77 y.o. male with a past medical history of BOOP, diabetes, and morbid obesity who presents from a long-term care facility via EMS on 15 L nonrebreather with complaints of worsening shortness of breath over the last week.  Patient states that the symptoms are similar to flares of pneumonia that he has had in the past.  Patient states that he does not have any history of asthma however EMS did note patient wheezing over all lung fields and was placed on a DuoNeb.  Patient was also extremely hypoxic to the low 80s on room air.  Patient does not wear any oxygen at home.  Facility notes patient to be febrile over the last 2/3 days however patient is afebrile at this time and has not taken any antipyretics this morning. ROS: Patient currently denies any vision changes, tinnitus, difficulty speaking, facial droop, sore throat, chest pain, abdominal pain, nausea/vomiting/diarrhea, dysuria, or weakness/numbness/paresthesias in any extremity   Physical Exam  Triage Vital Signs: ED Triage Vitals  Encounter Vitals Group     BP      Systolic BP Percentile      Diastolic BP Percentile      Pulse      Resp      Temp      Temp src      SpO2      Weight      Height      Head Circumference      Peak Flow      Pain Score      Pain Loc      Pain Education      Exclude from Growth Chart    Most recent vital signs: Vitals:   08/20/23 1310 08/20/23 1352  BP: (!) 88/59 (!) 82/50  Pulse: (!) 53   Resp: (!) 26   Temp:    SpO2: 98%    General: Awake, oriented x4. CV:  Good peripheral perfusion.  Resp:  Increased effort.  Inspiratory and expiratory wheezing over bilateral lung fields Abd:  No distention.  Other:  Elderly obese Caucasian male resting in stretcher on 4 L nasal cannula in moderate  respiratory distress ED Results / Procedures / Treatments  Labs (all labs ordered are listed, but only abnormal results are displayed) Labs Reviewed  LACTIC ACID, PLASMA - Abnormal; Notable for the following components:      Result Value   Lactic Acid, Venous 3.6 (*)    All other components within normal limits  LACTIC ACID, PLASMA - Abnormal; Notable for the following components:   Lactic Acid, Venous 3.5 (*)    All other components within normal limits  COMPREHENSIVE METABOLIC PANEL - Abnormal; Notable for the following components:   Potassium 3.2 (*)    Glucose, Bld 153 (*)    BUN 57 (*)    Creatinine, Ser 3.07 (*)    Calcium 8.6 (*)    Total Protein 6.0 (*)    Albumin 3.4 (*)    Total Bilirubin 2.2 (*)    GFR, Estimated 20 (*)    All other components within normal limits  CBC WITH DIFFERENTIAL/PLATELET - Abnormal; Notable for the following components:   WBC 17.0 (*)    RBC 3.79 (*)    Hemoglobin 12.6 (*)    HCT 38.7 (*)  MCV 102.1 (*)    RDW 15.8 (*)    nRBC 0.8 (*)    Neutro Abs 11.2 (*)    Lymphs Abs 4.7 (*)    Abs Immature Granulocytes 0.14 (*)    All other components within normal limits  PROTIME-INR - Abnormal; Notable for the following components:   Prothrombin Time 15.7 (*)    All other components within normal limits  RESP PANEL BY RT-PCR (RSV, FLU A&B, COVID)  RVPGX2  CULTURE, BLOOD (ROUTINE X 2)  CULTURE, BLOOD (ROUTINE X 2)  MRSA NEXT GEN BY PCR, NASAL  APTT  BLOOD GAS, VENOUS  MAGNESIUM  PROCALCITONIN   EKG ED ECG REPORT I, Merwyn Katos, the attending physician, personally viewed and interpreted this ECG. Date: 08/20/2023 EKG Time: 1057 Rate: 144 Rhythm: Tachycardic sinus rhythm QRS Axis: normal Intervals: Right bundle branch block ST/T Wave abnormalities: normal Narrative Interpretation: Tachycardic sinus rhythm with right bundle branch block.  No evidence of acute ischemia RADIOLOGY ED MD interpretation: Single view portable chest x-ray  shows new left basilar opacity -Agree with radiology assessment Official radiology report(s): DG Chest Port 1 View Result Date: 08/20/2023 CLINICAL DATA:  Questionable sepsis - evaluate for abnormality. EXAM: PORTABLE CHEST 1 VIEW COMPARISON:  06/19/2023. FINDINGS: Low lung volume. There is new left basilar opacity with probable trace left pleural effusion. There is minimal blunting of right lateral costophrenic angle as well, which may represent trace right pleural effusion. Bilateral lungs are otherwise clear. Redemonstration of left lateral pleural calcification, nonspecific but commonly seen with asbestos related disease. No pneumothorax on either side. No pulmonary edema. Stable cardio-mediastinal silhouette. No acute osseous abnormalities. The soft tissues are within normal limits. IMPRESSION: *New left basilar opacity, which may represent combination of atelectasis and/or pneumonia. There is associated trace left pleural effusion. Follow up to clearing is recommended. *Probable new trace right pleural effusion.  No pulmonary edema. Electronically Signed   By: Jules Schick M.D.   On: 08/20/2023 13:21   PROCEDURES: Critical Care performed: Yes, see critical care procedure note(s) .1-3 Lead EKG Interpretation  Performed by: Merwyn Katos, MD Authorized by: Merwyn Katos, MD     Interpretation: abnormal     ECG rate:  112   ECG rate assessment: tachycardic     Rhythm: sinus tachycardia     Ectopy: none     Conduction: normal   Comments:     [facility] CRITICAL CARE Performed by: Merwyn Katos  Total critical care time: 35 minutes  Critical care time was exclusive of separately billable procedures and treating other patients.  Critical care was necessary to treat or prevent imminent or life-threatening deterioration.  Critical care was time spent personally by me on the following activities: development of treatment plan with patient and/or surrogate as well as nursing, discussions  with consultants, evaluation of patient's response to treatment, examination of patient, obtaining history from patient or surrogate, ordering and performing treatments and interventions, ordering and review of laboratory studies, ordering and review of radiographic studies, pulse oximetry and re-evaluation of patient's condition.  MEDICATIONS ORDERED IN ED: Medications  lactated ringers infusion (has no administration in time range)  vancomycin (VANCOCIN) IVPB 1000 mg/200 mL premix (0 mg Intravenous Stopped 08/20/23 1323)    Followed by  vancomycin (VANCOCIN) IVPB 1000 mg/200 mL premix (1,000 mg Intravenous New Bag/Given 08/20/23 1333)    Followed by  vancomycin (VANCOREADY) IVPB 500 mg/100 mL (has no administration in time range)  ondansetron (ZOFRAN) tablet 4 mg (  has no administration in time range)    Or  ondansetron (ZOFRAN) injection 4 mg (has no administration in time range)  heparin injection 5,000 Units (has no administration in time range)  senna-docusate (Senokot-S) tablet 1 tablet (has no administration in time range)  insulin aspart (novoLOG) injection 0-9 Units (has no administration in time range)  insulin aspart (novoLOG) injection 0-5 Units (has no administration in time range)  azithromycin (ZITHROMAX) 500 mg in sodium chloride 0.9 % 250 mL IVPB (has no administration in time range)  lactated ringers bolus 1,000 mL (0 mLs Intravenous Stopped 08/20/23 1241)  meropenem (MERREM) 1 g in sodium chloride 0.9 % 100 mL IVPB (0 g Intravenous Stopped 08/20/23 1156)  ipratropium-albuterol (DUONEB) 0.5-2.5 (3) MG/3ML nebulizer solution 6 mL (6 mLs Nebulization Given 08/20/23 1326)  sodium chloride 0.9 % bolus 500 mL (500 mLs Intravenous New Bag/Given 08/20/23 1349)   IMPRESSION / MDM / ASSESSMENT AND PLAN / ED COURSE  I reviewed the triage vital signs and the nursing notes.                             The patient is on the cardiac monitor to evaluate for evidence of arrhythmia and/or  significant heart rate changes. Patient's presentation is most consistent with acute presentation with potential threat to life or bodily function. Presents with shortness of breath, cough, and malaise concerning for pneumonia.  DDx: PE, COPD exacerbation, Pneumothorax, TB, Atypical ACS, Esophageal Rupture, Toxic Exposure, Foreign Body Airway Obstruction.  Workup: CXR CBC, CMP, lactate, troponin  Given History, Exam, and Workup presentation most consistent with pneumonia.  Findings: New left lower lung infiltrate  Tx: Vancomycin, meropenem  1401 Reassessment: As patient is continuing to require supplemental oxygenation for acute hypoxic respiratory failure, patient will require admission to the internal medicine service for further evaluation and management  Disposition: Admit   FINAL CLINICAL IMPRESSION(S) / ED DIAGNOSES   Final diagnoses:  Community acquired pneumonia of left lower lobe of lung  Acute respiratory failure with hypoxia (HCC)   Rx / DC Orders   ED Discharge Orders     None      Note:  This document was prepared using Dragon voice recognition software and may include unintentional dictation errors.   Merwyn Katos, MD 08/20/23 [phone]

## 2023-08-20 NOTE — Assessment & Plan Note (Addendum)
IS, Flutter valvue, respiratory care team consulted to educate patient on appropriate incentive spirometry and flutter valve use Chest physiotherapy every 4 hours while awake ordered in attempt to loosen mucus Patient may benefit from being discharged home with a chest physiotherapy vest

## 2023-08-20 NOTE — H&P (Addendum)
History and Physical   CULVER DRAIN UJW:[id number] DOB: 26-Jun-1946 DOA: 08/20/2023  PCP: Judy Pimple, MD  Outpatient Specialists: Dr. Jetty Duhamel, [facility] Patient coming from: Home via EMS  I have personally briefly reviewed patient's old medical records in [facility] EMR.  Chief Concern: Shortness of breath  HPI: [name] is a 77 year old male with history of CAD status post CABG in 1997, hypertension, insulin-dependent diabetes mellitus, CKD stage IV, hyperlipidemia, gout, reactive airway symptoms, cryptogenic organizing pneumonia, on chronic daily steroid, polycystic kidney disease, GERD, who presents to the emergency department for chief concerns of shortness of breath.  Per nursing documentation, patient had SpO2 of mid 80s on room air via EMS.  It was documented that EMS gave patient 2 DuoNeb's and placed patient on 4 L nasal cannula.  Vitals in the ED showed temperature of 99.6, respiration rate 26, heart rate 136, blood pressure 97/62, SpO2 of 95% on 4 L nasal cannula.  Serum sodium is 139, potassium 3.2, chloride 102, bicarb 25, BUN 57, serum creatinine of 3.07, nonfasting blood glucose 153, EGFR of 20, WBC 17, hemoglobin 12.6, platelets of 164.  Lactic acid 3.6.  COVID/influenza A/influenza B/RSV PCR were negative.  VBG has been ordered by EDP and pending collection at this time.  ED treatment: Lactated ringer 1 L bolus, lactic to ringer infusion at 150 mL/h, meropenem 1 g IV, vancomycin per pharmacy.  BiPAP therapy. ------------------------------- At bedside, he is able to tell me his name, age, current location, current calendar year.  He reprots he has been short of breath for 2 days. Spouse he had a fever of 101.4 at home today and they called EMS.  He is not able to get anything up.  He denies nausea, vomiting, dysuria, hematuria, diarrhea.  He endorses constipation.  Reports every time he coughs, his bilateral upper abdominal area  hurts.  Social history: He lives at home with his wife of 13 years. He denies tobacco, etoh, and recreational drug use.  He is retired and formerly was an Pharmacist, hospital.  ROS: Constitutional: no weight change, no fever ENT/Mouth: no sore throat, no rhinorrhea Eyes: no eye pain, no vision changes Cardiovascular: + chest tenderness with cough, + dyspnea,  no edema, no palpitations Respiratory: + cough, no sputum, no wheezing Gastrointestinal: no nausea, no vomiting, no diarrhea, no constipation Genitourinary: no urinary incontinence, no dysuria, no hematuria Musculoskeletal: no arthralgias, no myalgias Skin: no skin lesions, no pruritus, Neuro: + weakness, no loss of consciousness, no syncope Psych: no anxiety, no depression, + decrease appetite Heme/Lymph: no bruising, no bleeding  ED Course: Discussed with EDP, patient requiring hospitalization for chief concerns of acute hypoxic respiratory failure.  Assessment/Plan  Principal Problem:   Acute hypoxemic respiratory failure (HCC) Active Problems:   Severe sepsis (HCC)   Pulmonary infiltrates with high  ESR c/w BOOP/ idiopathic    Hyperlipidemia associated with type 2 diabetes mellitus (HCC)   Essential hypertension   Coronary artery disease of bypass graft of native heart with stable angina pectoris (HCC)   GERD   CAD (coronary artery disease)   Constipation   BPH with obstruction/lower urinary tract symptoms   Cough   Hypotension   Immunocompromised due to corticosteroids (HCC)   Muscular chest pain   Assessment and Plan:  * Acute hypoxemic respiratory failure (HCC) Patient meets sepsis criteria, likely severe sepsis With history of cryptogenic organizing pneumonia Blood cultures x 2 are in process Meropenem and vancomycin per pharmacy Procalcitonin ordered on  admission was elevated at 3.85, added azithromycin 500 mg IV for atypical coverage Check procalcitonin in the a.m. Continue BiPAP and n.p.o. status Admit to  stepdown, inpatient  Severe sepsis (HCC) Increased respiration 20, heart rate, low normotensive, elevated lactic acid, leukocytosis, source of pneumonia in setting of patient with cryptogenic organizing pneumonia Blood cultures x 2 are in process Treat per above  Pulmonary infiltrates with high  ESR c/w BOOP/ idiopathic  IS, Flutter valvue, respiratory care team consulted to educate patient on appropriate incentive spirometry and flutter valve use Chest physiotherapy every 4 hours while awake ordered in attempt to loosen mucus Patient may benefit from being discharged home with a chest physiotherapy vest  Muscular chest pain 1-2 PRN lidocaine patches ordered Morphine 2 mg IV every 4 hours as needed for severe pain, 20 hours ordered  Hypotension Patient is status post LR 1 L bolus and lactated ringer infusion at 150 mL/h Patient remains hypotensive on recheck with nursing, manual blood pressure 82/50 Sodium chloride 500 mL bolus ordered  At bedside: Patient's blood pressure is 90/58  Cough Guaifenesin every 4 hours as needed to loosen phlegm, 3 days ordered Tussionex nightly as needed for cough, 2 days ordered  Constipation Senna docusate 1 tablet nightly ordered on admission MiraLAX 17 g p.o. twice daily as needed for moderate constipation, 5 days ordered  CAD (coronary artery disease) Status post CABG in 1997 Home rosuvastatin 40 mg nightly, aspirin 81 mg daily, were resumed on admission  GERD Home famotidine 20 mg nightly resumed Protonix 40 mg IV twice daily as needed for acid reflux  Essential hypertension Hydralazine 5 mg IV every 6 hours as needed for SBP greater 175, 4 days ordered  Hyperlipidemia associated with type 2 diabetes mellitus (HCC) Rosuvastatin 40 mg daily, ezetimibe 10 mg daily resumed on admission  AM team to follow-up with med reconciliation  Chart reviewed.   DVT prophylaxis: 5000 subcutaneous every 8 hours Code Status: DNR/DNI, yellow MOST  form at bedside Diet: N.p.o. except for sips of meds and ice chips at patient's request; order to advance diet as tolerated from n.p.o. to full liquid diet placed Family Communication: Updated spouse at bedside with patient's permission Disposition Plan: Pending clinical course, overall poor prognosis in setting of multiple chronic medical conditions including cryptogenic organizing pneumonia, polycystic kidney disease, CAD Consults called: Respiratory therapy Admission status: Stepdown, inpatient  Past Medical History:  Diagnosis Date   Arthritis    Chronic kidney disease    Complication of anesthesia    constipation and inability to  urinate happened a few days after cataract surgery    Diabetes (HCC)    type 2    Diverticulitis    GERD (gastroesophageal reflux disease)    Gout    Heart attack (HCC)    Heart disease    Heart murmur    Hyperglycemia    Hyperlipidemia    Hypertension    Osteoporosis    Pneumonia    hx of BOOP followed by Dr Sherene Sires    Sleep apnea    cpapp - setting at 15    Past Surgical History:  Procedure Laterality Date   ACROMIO-CLAVICULAR JOINT REPAIR Left 05/05/2019   Procedure: SHOULDER ARTHROSCOPIC ASSISTED ACROMIO-CLAVICULAR JOINT REPAIR;  Surgeon: Jones Broom, MD;  Location: [facility];  Service: Orthopedics;  Laterality: Left;   CATARACT EXTRACTION Bilateral    CORONARY ARTERY BYPASS GRAFT     [place]   LEFT HEART CATH AND CORS/GRAFTS ANGIOGRAPHY N/A 09/19/2021  Procedure: LEFT HEART CATH AND CORS/GRAFTS ANGIOGRAPHY;  Surgeon: Lyn Records, MD;  Location: [facility] INVASIVE CV LAB;  Service: Cardiovascular;  Laterality: N/A;   open heart surgery  09/09/1995-1998   5 bypass   REPAIR KNEE LIGAMENT     right   SHOULDER ARTHROSCOPY Left 05/05/2019   Procedure: ARTHROSCOPY SHOULDER;  Surgeon: Jones Broom, MD;  Location: [facility];  Service: Orthopedics;  Laterality: Left;   TONSILLECTOMY     TOTAL KNEE ARTHROPLASTY Left 05/29/2020   Procedure:  LEFT TOTAL KNEE ARTHROPLASTY;  Surgeon: Marcene Corning, MD;  Location: [facility];  Service: Orthopedics;  Laterality: Left;   Social History:  reports that he quit smoking about 18 years ago. His smoking use included cigars and cigarettes. He started smoking about 21 years ago. He has a 3 pack-year smoking history. He has never used smokeless tobacco. He reports current alcohol use of about 1.0 standard drink of alcohol per week. He reports that he does not use drugs.  Allergies  Allergen Reactions   Oxycodone-Acetaminophen Other (See Comments)    Stops breathing; has tolerated Tylenol before   Oxycodone-Acetaminophen Anaphylaxis   Family History  Problem Relation Age of Onset   Heart disease Mother    Heart disease Father    Stroke Father    Heart attack Son 6   Kidney disease Neg Hx    Prostate cancer Neg Hx    Diabetes Neg Hx    Family history: Family history reviewed and not pertinent.  Prior to Admission medications   Medication Sig Start Date End Date Taking? Authorizing Provider  allopurinol (ZYLOPRIM) 100 MG tablet TAKE 1 TABLET BY MOUTH TWICE A DAY 06/08/23   Tower, Audrie Gallus, MD  aspirin EC (ASPIRIN 81) 81 MG tablet Take 81 mg by mouth daily. Swallow whole.    [provider]  Calcium Carbonate-Vit D-Min (CALCIUM-VITAMIN D-MINERALS) 600-800 MG-UNIT CHEW Chew 1 tablet by mouth daily.    [provider]  colchicine 0.6 MG tablet Take 1 tablet (0.6 mg total) by mouth as needed (GOUT FLARE). TAKE 1 TABLET BY MOUTH TWICE A DAY WITH A MEAL AS NEEDED FOR ACUTE FLARE OF GOUT 04/22/23   Tower, Audrie Gallus, MD  Continuous Blood Gluc Receiver (FREESTYLE LIBRE 2 READER) DEVI USE AS INSTRUCTED TO CHECK BLOOD SUGARS. 01/13/22   Romero Belling, MD  Continuous Blood Gluc Sensor (FREESTYLE LIBRE 2 SENSOR) MISC 1 Device by Does not apply route every 14 (fourteen) days. 10/31/22   Carlus Pavlov, MD  cyanocobalamin (VITAMIN B12) 500 MCG tablet Take 500 mcg by mouth daily.    [provider]  ezetimibe (ZETIA) 10 MG tablet TAKE 1 TABLET BY MOUTH EVERY DAY 07/08/23   Tower, Fulton A, MD  famotidine (PEPCID) 20 MG tablet TAKE 1 TABLET BY MOUTH EVERYDAY AT BEDTIME 04/10/23   Nyoka Cowden, MD  furosemide (LASIX) 20 MG tablet TAKE 1 TABLET BY MOUTH EVERY DAY 07/08/23   Antonieta Iba, MD  gabapentin (NEURONTIN) 100 MG capsule Take 300 mg by mouth daily. 01/04/23   [provider]  Glucosamine HCl (GLUCOSAMINE PO) Take 1,500 mg by mouth 2 (two) times daily.     [provider]  glucose blood (FREESTYLE LITE) test strip 1 each by Other route 4 (four) times daily. E11.65 04/10/20   Romero Belling, MD  insulin isophane & regular human KwikPen (NOVOLIN 70/30 KWIKPEN) (70-30) 100 UNIT/ML KwikPen Inject 30 Units into the skin daily with breakfast. And pen needles 1/day 05/08/23  Carlus Pavlov, MD  insulin lispro (HUMALOG KWIKPEN) 200 UNIT/ML KwikPen Inject 10-14 Units into the skin 2 (two) times daily before a meal. INJECT 12-15 UNITS INTO THE SKIN 2 (TWO) TIMES DAILY BEFORE A MEAL. 05/08/23   Carlus Pavlov, MD  Insulin Pen Needle (BD PEN NEEDLE NANO 2ND GEN) 32G X 4 MM MISC USE 3 TIMES A DAY 01/25/23   Carlus Pavlov, MD  ketoconazole (NIZORAL) 2 % cream Apply 1 Application topically 2 (two) times daily. 11/10/22   [provider]  Lancets (FREESTYLE) lancets 1 EACH BY OTHER ROUTE 4 (FOUR) TIMES DAILY. E11.65 08/23/20   Romero Belling, MD  Lutein 20 MG CAPS Take 20 mg by mouth daily.    [provider]  meclizine (ANTIVERT) 12.5 MG tablet TAKE 1-2 TABLETS BY MOUTH THREE TIMES A DAY AS NEEDED FOR DIZZINESS 10/30/22   [provider]  Multiple Vitamins-Minerals (PRESERVISION AREDS 2 PO) Take by mouth.    [provider]  nitroGLYCERIN (NITROSTAT) 0.4 MG SL tablet Place 1 tablet (0.4 mg total) under the tongue every 5 (five) minutes x 3 doses as needed for chest pain. 01/19/23   Antonieta Iba, MD  omeprazole (PRILOSEC) 40 MG  capsule TAKE 1 CAPUSLE BY MOUTH 30- 60 MIN BEFORE YOUR FIRST AND LAST MEALS OF THE DAY 10/31/22   Nyoka Cowden, MD  ondansetron (ZOFRAN) 8 MG tablet Take 1 tablet (8 mg total) by mouth every 8 (eight) hours as needed for nausea or vomiting. From narcotic pain medicine 01/21/23   Tower, Audrie Gallus, MD  polyethylene glycol (MIRALAX / GLYCOLAX) 17 g packet Take 17 g by mouth daily.    [provider]  predniSONE (DELTASONE) 10 MG tablet TAKE 2 TABLETS BY MOUTH UNTIL BETTER THEN TAPER TO 1/2 TABLET Patient taking differently: Take 5 mg by mouth daily with breakfast. 12/12/22   Nyoka Cowden, MD  predniSONE (DELTASONE) 5 MG tablet 3 daily 07/06/23   Nyoka Cowden, MD  rosuvastatin (CRESTOR) 40 MG tablet TAKE 1 TABLET BY MOUTH EVERY DAY 11/18/22   Tower, Audrie Gallus, MD  Semaglutide, 2 MG/DOSE, (OZEMPIC, 2 MG/DOSE,) 8 MG/3ML SOPN Inject 2 mg into the skin once a week. 05/08/23   Carlus Pavlov, MD   Physical Exam: Vitals:   08/20/23 1352 08/20/23 1400 08/20/23 1430 08/20/23 1443  BP: (!) 82/50 (!) 88/58 (!) 88/56   Pulse:  (!) 46 63   Resp:  (!) 32 (!) 23   Temp:    99.5 F (37.5 C)  TempSrc:    Oral  SpO2:  96% 97%   Weight:      Height:       Constitutional: appears frail, chronically ill and acutely ill Eyes: PERRL, lids and conjunctivae normal ENMT: Mucous membranes are moist. Posterior pharynx clear of any exudate or lesions. Age-appropriate dentition. Hearing appropriate Neck: normal, supple, no masses, no thyromegaly Respiratory: Generalized decreased lung sounds on auscultation bilaterally, no wheezing, no crackles.  Increased respiratory effort.  Mild increased accessory muscle use.  Cardiovascular: Regular rate and rhythm, no murmurs / rubs / gallops.  Bilateral lower extremity edema. 2+ pedal pulses. No carotid bruits.  Sternotomy scar consistent with history of open heart surgery Abdomen: Morbidly obese abdomen with abdominal striae a, + bilateral upper abdominal tenderness,  no masses palpated, no hepatosplenomegaly. Bowel sounds positive.  Musculoskeletal: no clubbing / cyanosis. No joint deformity upper and lower extremities. Good ROM, no contractures, no atrophy. Normal muscle tone.  Skin: no rashes,  lesions, ulcers. No induration Neurologic: Sensation intact. Strength 5/5 in all 4.  Psychiatric: Normal judgment and insight. Alert and oriented x 3. Normal mood.   EKG: independently reviewed, showing sinus tachycardia with rate of 144, QTc 528  Chest x-ray on Admission: I personally reviewed and I agree with radiologist reading as below.  DG Chest Port 1 View Result Date: 08/20/2023 CLINICAL DATA:  Questionable sepsis - evaluate for abnormality. EXAM: PORTABLE CHEST 1 VIEW COMPARISON:  06/19/2023. FINDINGS: Low lung volume. There is new left basilar opacity with probable trace left pleural effusion. There is minimal blunting of right lateral costophrenic angle as well, which may represent trace right pleural effusion. Bilateral lungs are otherwise clear. Redemonstration of left lateral pleural calcification, nonspecific but commonly seen with asbestos related disease. No pneumothorax on either side. No pulmonary edema. Stable cardio-mediastinal silhouette. No acute osseous abnormalities. The soft tissues are within normal limits. IMPRESSION: *New left basilar opacity, which may represent combination of atelectasis and/or pneumonia. There is associated trace left pleural effusion. Follow up to clearing is recommended. *Probable new trace right pleural effusion.  No pulmonary edema. Electronically Signed   By: Jules Schick M.D.   On: 08/20/2023 13:21   Labs on Admission: I have personally reviewed following labs  CBC: Recent Labs  Lab 08/20/23 1109  WBC 17.0*  NEUTROABS 11.2*  HGB 12.6*  HCT 38.7*  MCV 102.1*  PLT 164   Basic Metabolic Panel: Recent Labs  Lab 08/20/23 1109  NA 139  K 3.2*  CL 102  CO2 25  GLUCOSE 153*  BUN 57*  CREATININE 3.07*   CALCIUM 8.6*  MG 1.9   GFR: Estimated Creatinine Clearance: 24.4 mL/min (A) (by C-G formula based on SCr of 3.07 mg/dL (H)).  Liver Function Tests: Recent Labs  Lab 08/20/23 1109  AST 28  ALT 32  ALKPHOS 40  BILITOT 2.2*  PROT 6.0*  ALBUMIN 3.4*   Coagulation Profile: Recent Labs  Lab 08/20/23 1109  INR 1.2   Urine analysis:    Component Value Date/Time   COLORURINE YELLOW (A) 08/17/2022 2131   APPEARANCEUR HAZY (A) 08/17/2022 2131   LABSPEC 1.015 08/17/2022 2131   PHURINE 5.0 08/17/2022 2131   GLUCOSEU >=500 (A) 08/17/2022 2131   GLUCOSEU NEGATIVE 11/25/2010 0951   HGBUR NEGATIVE 08/17/2022 2131   BILIRUBINUR NEGATIVE 08/17/2022 2131   BILIRUBINUR neg. 08/30/2012 1548   KETONESUR 5 (A) 08/17/2022 2131   PROTEINUR 100 (A) 08/17/2022 2131   UROBILINOGEN 0.2 08/30/2012 1548   UROBILINOGEN 0.2 11/25/2010 0951   NITRITE NEGATIVE 08/17/2022 2131   LEUKOCYTESUR NEGATIVE 08/17/2022 2131   CRITICAL CARE Performed by: Dr. Sedalia Muta  Total critical care time: 32 minutes  Critical care time was exclusive of separately billable procedures and treating other patients.  Critical care was necessary to treat or prevent imminent or life-threatening deterioration.  Critical care was time spent personally by me on the following activities: development of treatment plan with patient and spouse as well as nursing, discussions with consultants, evaluation of patient's response to treatment, examination of patient, obtaining history from patient or surrogate, ordering and performing treatments and interventions, ordering and review of laboratory studies, ordering and review of radiographic studies, pulse oximetry and re-evaluation of patient's condition.  This document was prepared using Dragon Voice Recognition software and may include unintentional dictation errors.  Dr. Sedalia Muta [facility]  If 7PM-7AM, please contact overnight-coverage provider If 7AM-7PM, please contact day  attending provider [email or web address]  08/20/2023, 3:33 PM

## 2023-08-20 NOTE — Consult Note (Signed)
PHARMACY - BRIEF ANTIBIOTIC NOTE   Pharmacy has received consult(s) for meropenem and vancomycin from an ED provider. The patient's profile has been reviewed for ht/wt/allergies/indication/available labs.    One time order(s) placed for: meropenem 1 g and vancomycin 2500 mg  Further antibiotics/pharmacy consults should be ordered by admitting physician if indicated.                       Thank you,  Will M. Dareen Piano, PharmD Clinical Pharmacist 08/20/2023 11:16 AM

## 2023-08-20 NOTE — Assessment & Plan Note (Signed)
Status post CABG in 1997 Home rosuvastatin 40 mg nightly, aspirin 81 mg daily, were resumed on admission

## 2023-08-20 NOTE — Hospital Course (Signed)
Kurt Aguilar is a 77 year old male with history of CAD status post CABG in 1997, hypertension, insulin-dependent diabetes mellitus, CKD stage IV, hyperlipidemia, gout, reactive airway symptoms, cryptogenic organizing pneumonia, on chronic daily steroid, polycystic kidney disease, GERD, who presents to the emergency department for chief concerns of shortness of breath.  Per nursing documentation, patient had SpO2 of mid 80s on room air via EMS.  It was documented that EMS gave patient 2 DuoNeb's and placed patient on 4 L nasal cannula.  Vitals in the ED showed temperature of 99.6, respiration rate 26, heart rate 136, blood pressure 97/62, SpO2 of 95% on 4 L nasal cannula.  Serum sodium is 139, potassium 3.2, chloride 102, bicarb 25, BUN 57, serum creatinine of 3.07, nonfasting blood glucose 153, EGFR of 20, WBC 17, hemoglobin 12.6, platelets of 164.  Lactic acid 3.6.  COVID/influenza A/influenza B/RSV PCR were negative.  VBG has been ordered by EDP and pending collection at this time.  ED treatment: Lactated ringer 1 L bolus, lactic to ringer infusion at 150 mL/h, meropenem 1 g IV, vancomycin per pharmacy.  BiPAP therapy.

## 2023-08-20 NOTE — Consult Note (Signed)
CODE SEPSIS - PHARMACY COMMUNICATION  **Broad-spectrum antimicrobials should be administered within one hour of sepsis diagnosis**  Time Code Sepsis call or page was received: 1052  Antibiotics ordered: Meropenem, Vancomycin  Time of first antibiotic administration: 1126  Additional action taken by pharmacy: N/A  If necessary, name of provider/nurse contacted: N/A    Kurt Aguilar, PharmD Clinical Pharmacist 08/20/2023 11:01 AM

## 2023-08-20 NOTE — Assessment & Plan Note (Signed)
Rosuvastatin 40 mg daily, ezetimibe 10 mg daily resumed on admission

## 2023-08-20 NOTE — Assessment & Plan Note (Signed)
Increased respiration 20, heart rate, low normotensive, elevated lactic acid, leukocytosis, source of pneumonia in setting of patient with cryptogenic organizing pneumonia Blood cultures x 2 are in process Treat per above

## 2023-08-20 NOTE — ED Triage Notes (Signed)
Pt to ED via [facility] for SOB. Pt has hx of BOOP. Pt complains of SOB, wheezing, and cough x1 week. Pt denies chest pain. Pt denies being around anyone that has been sick. Pt on 4L [place] at this time SPO2 94%. Per EMS, pt was mid 80s on RA. EMS gave 2 duonebs Pt alert and oriented x4

## 2023-08-20 NOTE — ED Notes (Signed)
NRB placed over heated high flow

## 2023-08-20 NOTE — Assessment & Plan Note (Addendum)
Home famotidine 20 mg nightly resumed Protonix 40 mg IV twice daily as needed for acid reflux

## 2023-08-20 NOTE — Assessment & Plan Note (Addendum)
Patient is status post LR 1 L bolus and lactated ringer infusion at 150 mL/h Patient remains hypotensive on recheck with nursing, manual blood pressure 82/50 Sodium chloride 500 mL bolus ordered  At bedside: Patient's blood pressure is 90/58

## 2023-08-20 NOTE — Assessment & Plan Note (Addendum)
-   Hydralazine 5 mg IV every 6 hours as needed for SBP greater 175, 4 days ordered 

## 2023-08-20 NOTE — Consult Note (Signed)
Pharmacy Antibiotic Note  ASSESSMENT: 77 y.o. male with PMH including cryptogenic organizing pneumonia and morbid obesity is presenting with sepsis. Patient very hypoxic, now on 40 L via HHFNC. Hypotensive with tachycardia. CXR notable for new left-sided opacity, concerning for pneumonia. Patient in AKI, as well. Pharmacy has been consulted to manage meropenem and vancomycin dosing.  Patient measurements: Height: 5\' 10"  (177.8 cm) Weight: 111.6 kg (246 lb) IBW/kg (Calculated) : 73  Vital signs: Temp: 99.5 F (37.5 C) (12/12 1443) Temp Source: Oral (12/12 1443) BP: 88/56 (12/12 1430) Pulse Rate: 63 (12/12 1430) Recent Labs  Lab 08/20/23 1109  WBC 17.0*  CREATININE 3.07*   Estimated Creatinine Clearance: 24.4 mL/min (A) (by C-G formula based on SCr of 3.07 mg/dL (H)).  Allergies: Allergies  Allergen Reactions   Oxycodone-Acetaminophen Other (See Comments)    Stops breathing; has tolerated Tylenol before   Oxycodone-Acetaminophen Anaphylaxis    Antimicrobials this admission: Meropenem 12/12 >> Vancomycin 12/12 >> Azithromycin 12/12 >>  Dose adjustments this admission: N/A  Microbiology results: 12/12 BCx: sent 12/12 Flu/COVID/RSV: negative 12/12 MRSA PCR: ordered  PLAN: Initiate meropenem 500 mg IV q12H Administer vancomycin 2500 mg IV x 1 and obtain 24-hour vancomycin random level Follow up culture results to assess for antibiotic optimization. Monitor renal function to assess for any necessary antibiotic dosing changes.   Thank you for allowing pharmacy to be a part of this patient's care.  Will M. Dareen Piano, PharmD Clinical Pharmacist 08/20/2023 3:13 PM

## 2023-08-20 NOTE — Sepsis Progress Note (Signed)
Sepsis protocol monitored by eLink ?

## 2023-08-20 NOTE — Assessment & Plan Note (Addendum)
Patient meets sepsis criteria, likely severe sepsis With history of cryptogenic organizing pneumonia Blood cultures x 2 are in process Meropenem and vancomycin per pharmacy Procalcitonin ordered on admission was elevated at 3.85, added azithromycin 500 mg IV for atypical coverage Check procalcitonin in the a.m. Continue BiPAP and n.p.o. status Admit to stepdown, inpatient

## 2023-08-20 NOTE — ED Notes (Signed)
Lab called to send phlebotomist to collect labs.

## 2023-08-21 DIAGNOSIS — E785 Hyperlipidemia, unspecified: Secondary | ICD-10-CM

## 2023-08-21 DIAGNOSIS — G4733 Obstructive sleep apnea (adult) (pediatric): Secondary | ICD-10-CM

## 2023-08-21 DIAGNOSIS — R918 Other nonspecific abnormal finding of lung field: Secondary | ICD-10-CM

## 2023-08-21 DIAGNOSIS — I10 Essential (primary) hypertension: Secondary | ICD-10-CM

## 2023-08-21 DIAGNOSIS — A419 Sepsis, unspecified organism: Secondary | ICD-10-CM

## 2023-08-21 DIAGNOSIS — R6520 Severe sepsis without septic shock: Secondary | ICD-10-CM

## 2023-08-21 DIAGNOSIS — I25708 Atherosclerosis of coronary artery bypass graft(s), unspecified, with other forms of angina pectoris: Secondary | ICD-10-CM

## 2023-08-21 DIAGNOSIS — N401 Enlarged prostate with lower urinary tract symptoms: Secondary | ICD-10-CM | POA: Diagnosis not present

## 2023-08-21 DIAGNOSIS — E1169 Type 2 diabetes mellitus with other specified complication: Secondary | ICD-10-CM

## 2023-08-21 DIAGNOSIS — E669 Obesity, unspecified: Secondary | ICD-10-CM

## 2023-08-21 DIAGNOSIS — N138 Other obstructive and reflux uropathy: Secondary | ICD-10-CM

## 2023-08-21 DIAGNOSIS — J9601 Acute respiratory failure with hypoxia: Secondary | ICD-10-CM | POA: Diagnosis not present

## 2023-08-21 LAB — GLUCOSE, CAPILLARY
Glucose-Capillary: 152 mg/dL — ABNORMAL HIGH (ref 70–99)
Glucose-Capillary: 134 mg/dL — ABNORMAL HIGH (ref 70–99)
Glucose-Capillary: 229 mg/dL — ABNORMAL HIGH (ref 70–99)
Glucose-Capillary: 129 mg/dL — ABNORMAL HIGH (ref 70–99)

## 2023-08-21 LAB — BASIC METABOLIC PANEL
Anion gap: 10 (ref 5–15)
BUN: 62 mg/dL — ABNORMAL HIGH (ref 8–23)
CO2: 23 mmol/L (ref 22–32)
Calcium: 7.8 mg/dL — ABNORMAL LOW (ref 8.9–10.3)
Chloride: 104 mmol/L (ref 98–111)
Creatinine, Ser: 2.91 mg/dL — ABNORMAL HIGH (ref 0.61–1.24)
GFR, Estimated: 21 mL/min — ABNORMAL LOW (ref >60)
Glucose, Bld: 192 mg/dL — ABNORMAL HIGH (ref 70–99)
Potassium: 3.6 mmol/L (ref 3.5–5.1)
Sodium: 137 mmol/L (ref 135–145)

## 2023-08-21 LAB — CBC
HCT: 31 % — ABNORMAL LOW (ref 39.0–52.0)
Hemoglobin: 10.1 g/dL — ABNORMAL LOW (ref 13.0–17.0)
MCH: 32.3 pg (ref 26.0–34.0)
MCHC: 32.6 g/dL (ref 30.0–36.0)
MCV: 99 fL (ref 80.0–100.0)
Platelets: 129 10*3/uL — ABNORMAL LOW (ref 150–400)
RBC: 3.13 MIL/uL — ABNORMAL LOW (ref 4.22–5.81)
RDW: 15.9 % — ABNORMAL HIGH (ref 11.5–15.5)
WBC: 10 10*3/uL (ref 4.0–10.5)
nRBC: 0.4 % — ABNORMAL HIGH (ref 0.0–0.2)

## 2023-08-21 LAB — EXPECTORATED SPUTUM ASSESSMENT W GRAM STAIN, RFLX TO RESP C

## 2023-08-21 LAB — PROCALCITONIN: Procalcitonin: 6.77 ng/mL

## 2023-08-21 MED ORDER — FUROSEMIDE 20 MG PO TABS
20.0000 mg | ORAL_TABLET | Freq: Every day | ORAL | Status: DC
  Administered 2023-08-22 – 2023-08-26 (×5): 20 mg via ORAL
  Filled 2023-08-21 (×5): qty 1

## 2023-08-21 MED ORDER — FUROSEMIDE 10 MG/ML IJ SOLN
40.0000 mg | Freq: Once | INTRAMUSCULAR | Status: AC
  Administered 2023-08-21: 40 mg via INTRAVENOUS
  Filled 2023-08-21: qty 4

## 2023-08-21 NOTE — Plan of Care (Signed)
Continuing with plan of care. 

## 2023-08-21 NOTE — Plan of Care (Signed)

## 2023-08-21 NOTE — Progress Notes (Signed)
Progress Note   Patient: Kurt Aguilar UEA:[id number] DOB: Mar 05, 1946 DOA: 08/20/2023     1 DOS: the patient was seen and examined on 08/21/2023   Brief hospital course: Kurt Aguilar is a 77 year old male with history of CAD status post CABG in 1997, hypertension, insulin-dependent diabetes mellitus, CKD stage IV, hyperlipidemia, gout, reactive airway symptoms, cryptogenic organizing pneumonia, on chronic daily steroid, polycystic kidney disease, GERD, who presents to the emergency department for chief concerns of shortness of breath.  Patient was hypoxic requiring BiPAP, procalcitonin elevated, admitted to the hospitalist service for further management evaluation of severe sepsis secondary to pneumonia  Assessment and Plan: * Acute hypoxemic respiratory failure (HCC) Patient meets sepsis criteria, likely severe sepsis He has history of cryptogenic organizing pneumonia Blood cultures x 2 pending. Continue meropenem, azithromycin therapy per pharmacy. MRSA negative, vancomycin discontinued Procalcitonin elevated. He is weaned off BiPAP and is currently on 40% supplemental oxygen.  Continue to wean oxygen as tolerated. Continue to monitor him closely in stepdown unit.  Severe sepsis (HCC) Elevated respiratory rate, heart rate, low BP, high lactic acid. Blood cultures x 2 pending. Sputum cultures ordered. Continue meropenem, azithromycin therapy. Will hold IV fluids due to history of CHF.  Pulmonary infiltrates with high  ESR c/w BOOP/ idiopathic  IS, Flutter valvue, respiratory care team consulted to educate patient on appropriate incentive spirometry and flutter valve use. Will continue oral Lasix therapy. Chest physiotherapy every 4 hours while awake ordered in attempt to loosen mucus, Continue Robitussin, DuoNebs.  Acute on chronic kidney disease stage IIIb: Continue to monitor daily renal function.   Avoid nephrotoxic drugs. Will give 1 dose of IV Lasix 40 mill  grams.  CAD (coronary artery disease) Status post CABG in 1997 Continue rosuvastatin 40 mg nightly, aspirin 81 mg daily. Patient will be continued on oral Lasix therapy.  Essential hypertension Caution with antihypertensives. Blood pressure improved. Resume antihypertensives if BP remains elevated.  Hyperlipidemia associated with type 2 diabetes mellitus (HCC) Continue rosuvastatin 40 mg daily, ezetimibe 10 mg daily.  Type 2 diabetes mellitus: NovoLog 70/30 30 units with breakfast. Continue Accu-Cheks, sliding scale insulin. Carb consistent, cardiac diet ordered.  OSA on CPAP.  Advised wife to bring his own mask as hospital mask not fitting him. Obesity with BMI 36.38. Diet, exercise and weight reduction advised.      Out of bed to chair. Incentive spirometry. Nursing supportive care. Fall, aspiration precautions. DVT prophylaxis   Code Status: Limited: Do not attempt resuscitation (DNR) -DNR-LIMITED -Do Not Intubate/DNI   Subjective: Patient is seen and examined today morning.  He is lying in bed, requiring 40% high flow oxygen.  Wife at bedside.  Not eating well.  Has productive cough.  Did not get out of bed.  Feels weak.  Physical Exam: Vitals:   08/21/23 1200 08/21/23 1300 08/21/23 1400 08/21/23 1500  BP: (!) 122/102 (!) 140/127 (!) 141/105 97/71  Pulse: 88 99 (!) 118 (!) 108  Resp: 18 (!) 23 (!) 31 (!) 8  Temp: 98.4 F (36.9 C)     TempSrc: Oral     SpO2: 96% 95% 96% 94%  Weight:      Height:        General - Elderly obese Caucasian male, moderate respiratory distress HEENT - PERRLA, EOMI, atraumatic head, non tender sinuses. Lung -distant breath sounds, diffuse rales, rhonchi,, using accessory muscles  Heart - S1, S2 heard, no murmurs, rubs, 2+ pitting pedal edema. Abdomen - Soft, non tender, distended, bowel  sounds good Neuro - Alert, awake and oriented x 3, non focal exam. Skin - Warm and dry.  Data Reviewed:      Latest Ref Rng & Units  08/21/2023    3:27 AM 08/20/2023   11:09 AM 04/15/2023    8:29 AM  CBC  WBC 4.0 - 10.5 K/uL 10.0  17.0  10.7   Hemoglobin 13.0 - 17.0 g/dL 29.5  28.4  13.2   Hematocrit 39.0 - 52.0 % 31.0  38.7  42.6   Platelets 150 - 400 K/uL 129  164  242.0       Latest Ref Rng & Units 08/21/2023    3:27 AM 08/20/2023   11:09 AM 04/15/2023    8:29 AM  BMP  Glucose 70 - 99 mg/dL 440  102  725   BUN 8 - 23 mg/dL 62  57  40   Creatinine 0.61 - 1.24 mg/dL 3.66  4.40  3.47   Sodium 135 - 145 mmol/L 137  139  142   Potassium 3.5 - 5.1 mmol/L 3.6  3.2  3.8   Chloride 98 - 111 mmol/L 104  102  107   CO2 22 - 32 mmol/L 23  25  25    Calcium 8.9 - 10.3 mg/dL 7.8  8.6  9.3    DG Chest Port 1 View Result Date: 08/20/2023 CLINICAL DATA:  Questionable sepsis - evaluate for abnormality. EXAM: PORTABLE CHEST 1 VIEW COMPARISON:  06/19/2023. FINDINGS: Low lung volume. There is new left basilar opacity with probable trace left pleural effusion. There is minimal blunting of right lateral costophrenic angle as well, which may represent trace right pleural effusion. Bilateral lungs are otherwise clear. Redemonstration of left lateral pleural calcification, nonspecific but commonly seen with asbestos related disease. No pneumothorax on either side. No pulmonary edema. Stable cardio-mediastinal silhouette. No acute osseous abnormalities. The soft tissues are within normal limits. IMPRESSION: *New left basilar opacity, which may represent combination of atelectasis and/or pneumonia. There is associated trace left pleural effusion. Follow up to clearing is recommended. *Probable new trace right pleural effusion.  No pulmonary edema. Electronically Signed   By: Jules Schick M.D.   On: 08/20/2023 13:21     Family Communication: Discussed with patient, wife at bedside.  They understand and agree. All questions answereed.  Disposition: Status is: Inpatient Remains inpatient appropriate because: Sepsis with pneumonia, IV  antibiotics, supplemental oxygen  Planned Discharge Destination: Home with Home Health     MDM level 3: Patient is admitted for sepsis, pneumonia, still requiring high flow oxygen.  Patient will need close hemodynamic, neurologic, telemetry monitoring.  He is at high risk for sudden clinical deterioration.  Author: Marcelino Duster, MD 08/21/2023 3:36 PM Secure chat 7am to 7pm For on call review [email or web address].

## 2023-08-21 NOTE — Progress Notes (Unsigned)
Cardiology Office Note  Date:  08/21/2023   ID:  Kurt Aguilar, DOB 1945-10-26, MRN [id number]  PCP:  Judy Pimple, MD   No chief complaint on file.   HPI:  77 yo with history of  CAD,  CABG back in 1997 in [place], [place], HTN,  Diabetes with chronic renal insufficiency hyperlipidemia,  gout He lost his son in early 2016 to cardiac arrest Aortic valve murmur/aortic valve disease h/o reactive airway symptoms, Boop,  Chronic renal insufficiency presents for routine followup of his coronary artery disease, CRI   Last seen by myself in clinic May 2024 Last seen by one of our providers August 2024  Episode of syncope May 2024 Echo with normal ejection fraction, mild AS Zio monitor with no significant arrhythmia Carotid ultrasound less than 39% disease bilaterally Metoprolol was held  Echo January 2023 EF 60 to 65%  Cardiac catheterization January 2023 Medical management recommended  Lab work reviewed Total cholesterol 169 LDL 78 Recent prednisone pulse   hospital admission 12/23, getting over gastroenteritis, acute renal injury, On prednisone taper for boop, was up to 40 mg now tapering down to 10  Covid in jan 2024 Does not qualify for oxygen per pulmonary He does report having saturations into the 80s on ambulation at home Seen by pulmonary 3/24: heart rate 112  Heart rate today 118 bpm, no improvement at the end of our visit Typically baseline heart rates in the 60s on low-dose metoprolol succinate Feels deconditioned, starting PT for his back  Previously reported having near syncope and syncope on metoprolol tartrate and was changed to metoprolol succinate  EKG personally reviewed by myself on todays visit Sinus tachycardia rate 118 bpm right bundle branch block consider old inferior MI  Other past medical history reviewed COVID-19 in December (positive on 12/4), underlying history of BOOP  hospitalization January 2023, Non-STEMI Cardiac catheterization  performed multivessel native coronary artery diseasesaphenous vein to right coronary artery with 70% ostial to proximal stenosis, SVG to OM 1 and 2 patent, SVG to first diagonal patent, LIMA to LAD patent.   The saphenous vein graft to the right coronary can be stented although it is not so critical that it should be causing enzyme elevation.  Enzyme elevation likely related to left coronary native disease in the septal perforator territory.  Echo 09/18/21 1. Left ventricular ejection fraction, by estimation, is 60 to 65%. The  left ventricle has normal function. The left ventricle has no regional  wall motion abnormalities. Left ventricular diastolic parameters are  consistent with Grade I diastolic  dysfunction (impaired relaxation). Elevated left ventricular end-diastolic  pressure.   2. Right ventricular systolic function is moderately reduced. The right  ventricular size is normal.   3. The mitral valve is normal in structure. No evidence of mitral valve  regurgitation. No evidence of mitral stenosis.   4. The AV is poorly visualized to comment on structure. There does appear  to be come calcification of the valve. . The aortic valve was not well  visualized. Aortic valve regurgitation is not visualized. No aortic  stenosis is present.   h/o reactive airway symptoms, Boop PreviouslyStarted on prednisoneMarch 2017  for shortness of breath, improvement of symptoms,  cough returned after prednisone was discontinued getting close To his baseline Minimal wheezing and cough predisone 5 mg daily, Plan may be to wean the dose Managed by Dr. Sherene Sires , Pulmonary   PMH:   has a past medical history of Arthritis, Chronic kidney disease, Complication  of anesthesia, Diabetes (HCC), Diverticulitis, GERD (gastroesophageal reflux disease), Gout, Heart attack (HCC), Heart disease, Heart murmur, Hyperglycemia, Hyperlipidemia, Hypertension, Osteoporosis, Pneumonia, and Sleep apnea.  PSH:    Past  Surgical History:  Procedure Laterality Date   ACROMIO-CLAVICULAR JOINT REPAIR Left 05/05/2019   Procedure: SHOULDER ARTHROSCOPIC ASSISTED ACROMIO-CLAVICULAR JOINT REPAIR;  Surgeon: Jones Broom, MD;  Location: [facility];  Service: Orthopedics;  Laterality: Left;   CATARACT EXTRACTION Bilateral    CORONARY ARTERY BYPASS GRAFT     [place]   LEFT HEART CATH AND CORS/GRAFTS ANGIOGRAPHY N/A 09/19/2021   Procedure: LEFT HEART CATH AND CORS/GRAFTS ANGIOGRAPHY;  Surgeon: Lyn Records, MD;  Location: [facility];  Service: Cardiovascular;  Laterality: N/A;   open heart surgery  09/09/1995-1998   5 bypass   REPAIR KNEE LIGAMENT     right   SHOULDER ARTHROSCOPY Left 05/05/2019   Procedure: ARTHROSCOPY SHOULDER;  Surgeon: Jones Broom, MD;  Location: [facility];  Service: Orthopedics;  Laterality: Left;   TONSILLECTOMY     TOTAL KNEE ARTHROPLASTY Left 05/29/2020   Procedure: LEFT TOTAL KNEE ARTHROPLASTY;  Surgeon: Marcene Corning, MD;  Location: [facility];  Service: Orthopedics;  Laterality: Left;    No current facility-administered medications for this visit.   No current outpatient medications on file.   Facility-Administered Medications Ordered in Other Visits  Medication Dose Route Frequency Provider Last Rate Last Admin   acetaminophen (TYLENOL) tablet 650 mg  650 mg Oral Q6H PRN Cox, Amy N, DO       allopurinol (ZYLOPRIM) tablet 100 mg  100 mg Oral BID Cox, Amy N, DO       aspirin EC tablet 81 mg  81 mg Oral Daily Cox, Amy N, DO       azithromycin (ZITHROMAX) 500 mg in sodium chloride 0.9 % 250 mL IVPB  500 mg Intravenous Q24H Cox, Amy N, DO   Stopped at 08/20/23 1641   Chlorhexidine Gluconate Cloth 2 % PADS 6 each  6 each Topical Daily Cox, Amy N, DO       chlorpheniramine-HYDROcodone (TUSSIONEX) 10-8 MG/5ML suspension 5 mL  5 mL Oral QHS PRN Cox, Amy N, DO       cyanocobalamin (VITAMIN B12) tablet 500 mcg  500 mcg Oral Daily Cox, Amy N, DO       diphenhydrAMINE (BENADRYL)  injection 25 mg  25 mg Intravenous Q6H PRN Cox, Amy N, DO       ezetimibe (ZETIA) tablet 10 mg  10 mg Oral Daily Cox, Amy N, DO       famotidine (PEPCID) tablet 20 mg  20 mg Oral QHS Cox, Amy N, DO   20 mg at 08/20/23 2149   gabapentin (NEURONTIN) capsule 300 mg  300 mg Oral QHS Cox, Amy N, DO       guaiFENesin (ROBITUSSIN) 100 MG/5ML liquid 5 mL  5 mL Oral Q4H PRN Cox, Amy N, DO       heparin injection 5,000 Units  5,000 Units Subcutaneous Q8H Cox, Amy N, DO   5,000 Units at 08/21/23 0509   hydrALAZINE (APRESOLINE) injection 5 mg  5 mg Intravenous Q6H PRN Cox, Amy N, DO       insulin aspart (novoLOG) injection 0-5 Units  0-5 Units Subcutaneous QHS Cox, Amy N, DO   2 Units at 08/20/23 2151   insulin aspart (novoLOG) injection 0-9 Units  0-9 Units Subcutaneous TID WC Cox, Amy N, DO   2 Units at 08/20/23 1720  insulin aspart protamine- aspart (NOVOLOG MIX 70/30) injection 30 Units  30 Units Subcutaneous Q breakfast Cox, Amy N, DO       lidocaine (LIDODERM) 5 % 1-2 patch  1-2 patch Transdermal Q24H Cox, Amy N, DO   1 patch at 08/20/23 1534   meclizine (ANTIVERT) tablet 12.5 mg  12.5 mg Oral TID PRN Cox, Amy N, DO       meropenem (MERREM) 500 mg in sodium chloride 0.9 % 100 mL IVPB  500 mg Intravenous Q12H Cox, Amy N, DO   Stopped at 08/20/23 2238   metoprolol tartrate (LOPRESSOR) injection 5 mg  5 mg Intravenous Q4H PRN Cox, Amy N, DO       morphine (PF) 2 MG/ML injection 2 mg  2 mg Intravenous Q4H PRN Cox, Amy N, DO       nitroGLYCERIN (NITROSTAT) SL tablet 0.4 mg  0.4 mg Sublingual Q5 Min x 3 PRN Cox, Amy N, DO       ondansetron (ZOFRAN) tablet 4 mg  4 mg Oral Q6H PRN Cox, Amy N, DO       Or   ondansetron (ZOFRAN) injection 4 mg  4 mg Intravenous Q6H PRN Cox, Amy N, DO       Oral care mouth rinse  15 mL Mouth Rinse PRN Cox, Amy N, DO       pantoprazole (PROTONIX) injection 40 mg  40 mg Intravenous BID PRN Cox, Amy N, DO       polyethylene glycol (MIRALAX / GLYCOLAX) packet 17 g  17 g Oral BID  PRN Cox, Amy N, DO       predniSONE (DELTASONE) tablet 20 mg  20 mg Oral Q breakfast Cox, Amy N, DO   20 mg at 08/20/23 1535   rosuvastatin (CRESTOR) tablet 40 mg  40 mg Oral Daily Cox, Amy N, DO       senna-docusate (Senokot-S) tablet 1 tablet  1 tablet Oral QHS Cox, Amy N, DO   1 tablet at 08/20/23 2149   vancomycin variable dose per unstable renal function (pharmacist dosing)   Does not apply See admin instructions Cox, Amy N, DO         Allergies:   Oxycodone-acetaminophen and Oxycodone-acetaminophen   Social History:  The patient  reports that he quit smoking about 18 years ago. His smoking use included cigars and cigarettes. He started smoking about 21 years ago. He has a 3 pack-year smoking history. He has never used smokeless tobacco. He reports current alcohol use of about 1.0 standard drink of alcohol per week. He reports that he does not use drugs.   Family History:   family history includes Heart attack (age of onset: 61) in his son; Heart disease in his father and mother; Stroke in his father.    Review of Systems: Review of Systems  Constitutional: Negative.   HENT: Negative.    Respiratory:  Positive for shortness of breath.   Cardiovascular: Negative.   Gastrointestinal: Negative.   Musculoskeletal: Negative.   Neurological: Negative.   Psychiatric/Behavioral: Negative.    All other systems reviewed and are negative.   PHYSICAL EXAM: VS:  There were no vitals taken for this visit. , BMI There is no height or weight on file to calculate BMI.  Constitutional:  oriented to person, place, and time. No distress.  HENT:  Head: Grossly normal Eyes:  no discharge. No scleral icterus.  Neck: No JVD, no carotid bruits  Cardiovascular: Regular, tachycardic, no murmurs appreciated Pulmonary/Chest:  Scattered Rales halfway up bilaterally Abdominal: Soft.  no distension.  no tenderness.  Musculoskeletal: Normal range of motion Neurological:  normal muscle tone. Coordination  normal. No atrophy Skin: Skin warm and dry Psychiatric: normal affect, pleasant  Recent Labs: 04/15/2023: TSH 3.47 08/20/2023: ALT 32; Magnesium 1.9 08/21/2023: BUN 62; Creatinine, Ser 2.91; Hemoglobin 10.1; Platelets 129; Potassium 3.6; Sodium 137    Lipid Panel Lab Results  Component Value Date   CHOL 138 04/15/2023   HDL 47.60 04/15/2023   LDLCALC 33 02/14/2022   TRIG 320.0 (H) 04/15/2023    Wt Readings from Last 3 Encounters:  08/20/23 253 lb 8.5 oz (115 kg)  07/06/23 253 lb 3.2 oz (114.9 kg)  06/19/23 247 lb (112 kg)     ASSESSMENT AND PLAN:  Syncope No recent symptoms of near syncope or syncope Reports symptoms were better after changing off metoprolol to tartrate to succinate  Sinus tachycardia Unable to exclude atrial tachycardia Rate seems locked-in at 118 bpm We have ordered a Holter monitor for further evaluation  Coronary artery disease with stable angina, bypass cardiac catheterization early 2023 At the time it was felt vein graft to RCA lesion does not appear to be critical.  Intervention would likely not provide significant clinical benefit Given tachycardia above, echocardiogram ordered  Pure hypercholesterolemia - Plan: EKG 12-Lead Cholesterol trending upwards, 20 pound weight gain in the past year on chronic prednisone If numbers continue to run high may need to add Repatha  Essential hypertension - Plan: EKG 12-Lead Continue Lasix, metoprolol Imdur on hold given orthostasis/syncope Blood pressure stable  Chronic moderate shortness of breath,  History of COPD exacerbation/Boop, followed by pulmonary Component of deconditioning, on chronic steroids Pulmonary reporting he does not qualify for oxygen  Controlled type 2 diabetes mellitus with diabetic nephropathy, unspecified long term insulin use status (HCC) - Followed by endocrinology, reports Jardiance held  calorie restriction recommended  Morbid obesity due to excess calories (HCC) in  setting of pred rx  -  Calorie restriction recommended   Total encounter time more than 40 minutes  Greater than 50% was spent in counseling and coordination of care with the patient      No orders of the defined types were placed in this encounter.     Signed, Dossie Arbour, M.D., Ph.D. 08/21/2023  [facility] [place], [place] [phone]

## 2023-08-22 ENCOUNTER — Inpatient Hospital Stay: Payer: PPO

## 2023-08-22 DIAGNOSIS — J189 Pneumonia, unspecified organism: Secondary | ICD-10-CM | POA: Diagnosis not present

## 2023-08-22 DIAGNOSIS — A419 Sepsis, unspecified organism: Secondary | ICD-10-CM | POA: Diagnosis not present

## 2023-08-22 DIAGNOSIS — N401 Enlarged prostate with lower urinary tract symptoms: Secondary | ICD-10-CM | POA: Diagnosis not present

## 2023-08-22 DIAGNOSIS — J9601 Acute respiratory failure with hypoxia: Secondary | ICD-10-CM | POA: Diagnosis not present

## 2023-08-22 DIAGNOSIS — J398 Other specified diseases of upper respiratory tract: Secondary | ICD-10-CM | POA: Diagnosis not present

## 2023-08-22 DIAGNOSIS — R918 Other nonspecific abnormal finding of lung field: Secondary | ICD-10-CM | POA: Diagnosis not present

## 2023-08-22 LAB — BASIC METABOLIC PANEL
Anion gap: 12 (ref 5–15)
BUN: 66 mg/dL — ABNORMAL HIGH (ref 8–23)
CO2: 22 mmol/L (ref 22–32)
Calcium: 7.8 mg/dL — ABNORMAL LOW (ref 8.9–10.3)
Chloride: 102 mmol/L (ref 98–111)
Creatinine, Ser: 2.97 mg/dL — ABNORMAL HIGH (ref 0.61–1.24)
GFR, Estimated: 21 mL/min — ABNORMAL LOW (ref >60)
Glucose, Bld: 129 mg/dL — ABNORMAL HIGH (ref 70–99)
Potassium: 3.3 mmol/L — ABNORMAL LOW (ref 3.5–5.1)
Sodium: 136 mmol/L (ref 135–145)

## 2023-08-22 LAB — LACTIC ACID, PLASMA: Lactic Acid, Venous: 1.4 mmol/L (ref 0.5–1.9)

## 2023-08-22 LAB — CBC
HCT: 32.5 % — ABNORMAL LOW (ref 39.0–52.0)
Hemoglobin: 10.8 g/dL — ABNORMAL LOW (ref 13.0–17.0)
MCH: 33.4 pg (ref 26.0–34.0)
MCHC: 33.2 g/dL (ref 30.0–36.0)
MCV: 100.6 fL — ABNORMAL HIGH (ref 80.0–100.0)
Platelets: 137 10*3/uL — ABNORMAL LOW (ref 150–400)
RBC: 3.23 MIL/uL — ABNORMAL LOW (ref 4.22–5.81)
RDW: 15.6 % — ABNORMAL HIGH (ref 11.5–15.5)
WBC: 8.2 10*3/uL (ref 4.0–10.5)
nRBC: 0.4 % — ABNORMAL HIGH (ref 0.0–0.2)

## 2023-08-22 LAB — GLUCOSE, CAPILLARY
Glucose-Capillary: 105 mg/dL — ABNORMAL HIGH (ref 70–99)
Glucose-Capillary: 114 mg/dL — ABNORMAL HIGH (ref 70–99)
Glucose-Capillary: 131 mg/dL — ABNORMAL HIGH (ref 70–99)
Glucose-Capillary: 193 mg/dL — ABNORMAL HIGH (ref 70–99)

## 2023-08-22 MED ORDER — SODIUM CHLORIDE 3 % IN NEBU
4.0000 mL | INHALATION_SOLUTION | Freq: Two times a day (BID) | RESPIRATORY_TRACT | Status: AC
  Administered 2023-08-22 – 2023-08-25 (×6): 4 mL via RESPIRATORY_TRACT
  Filled 2023-08-22 (×6): qty 4

## 2023-08-22 MED ORDER — MORPHINE SULFATE (PF) 2 MG/ML IV SOLN
2.0000 mg | INTRAVENOUS | Status: DC | PRN
  Administered 2023-08-22 – 2023-08-24 (×9): 2 mg via INTRAVENOUS
  Filled 2023-08-22 (×10): qty 1

## 2023-08-22 MED ORDER — POTASSIUM CHLORIDE CRYS ER 20 MEQ PO TBCR
20.0000 meq | EXTENDED_RELEASE_TABLET | Freq: Once | ORAL | Status: AC
  Administered 2023-08-22: 20 meq via ORAL
  Filled 2023-08-22: qty 1

## 2023-08-22 MED ORDER — ALBUTEROL SULFATE (2.5 MG/3ML) 0.083% IN NEBU
2.5000 mg | INHALATION_SOLUTION | Freq: Two times a day (BID) | RESPIRATORY_TRACT | Status: DC
  Administered 2023-08-22 – 2023-08-28 (×12): 2.5 mg via RESPIRATORY_TRACT
  Filled 2023-08-22 (×12): qty 3

## 2023-08-22 MED ORDER — BENZONATATE 100 MG PO CAPS
200.0000 mg | ORAL_CAPSULE | Freq: Three times a day (TID) | ORAL | Status: DC
  Administered 2023-08-22 – 2023-08-28 (×19): 200 mg via ORAL
  Filled 2023-08-22 (×19): qty 2

## 2023-08-22 NOTE — Progress Notes (Signed)
PT Cancellation Note  Patient Details Name: Kurt Aguilar MRN: [id number] DOB: November 12, 1945   Cancelled Treatment:    Reason Eval/Treat Not Completed: Medical issues which prohibited therapy (Consult received and chart reviewed. Per primary RN, patient with persistent respiratory difficulties; unable to tolerate evaluation this date.  Will continue to follow and re-attempt at later time/date as medically appropriate.)  [name], PT, DPT, NCS 08/22/23, 3:15 PM [phone]

## 2023-08-22 NOTE — Consult Note (Signed)
NAME:  Kurt Aguilar, MRN:  [id number], DOB:  10-May-1946, LOS: 2 ADMISSION DATE:  08/20/2023, CONSULTATION DATE:  08/22/2023 REFERRING MD:  Marcelino Duster, MD, CHIEF COMPLAINT:  Shortness of Breath   History of Present Illness:  77 year old male with history of CAD (CABG in 1997), HTN, T2DM, CKD, and a presumed history of organizing pneumonia who presents to the hospital with increased shortness of breath and is admitted for further management.  Symptoms onset was a few days prior to presentation. He has had increased shortness of breath and cough. He also reported a fever of up to 101.4 at home. Patient reports similar symptoms in the past for which he was prescribed steroids.  On arrival to the ED, he was saturating in the 80's on room air requiring nasal cannula. Viral panel was negative, and he received fluids for mild elevation in lactic acid. Procalcitonin notable elevated on presentation and he was started on broad spectrum antibiotics (meropenem, vancomycin, azithromycin). Given hypoxia, he was initiated on BiPAP and admitted in stepdown status for further care. He's since been transitioned to nasal cannula.  Medical record reviewed in depth. Patient followed by pulmonologist Sandrea Hughs, MD. He also has history of OSA followed by Jetty Duhamel, MD. He was seen in April of 2017 by Dr. Sherene Sires at which point he was given a presumptive diagnosis of "BOOP" and started on empiric steroids. This was tapered over the course of the summer but he subsequently required multiple courses of prednisone which were guided by ESR. No repeat chest CT was obtained following his index chest CT in 2017. No pulmonary biopsy obtained for tissue diagnosis at that time.  Patient denies tobacco use, reports occupational exposures to chemicals (worked at [employer]).  Pertinent  Medical History  -CAD s/p CABG in 1997   Objective   Blood pressure 124/70, pulse 78, temperature 98.1 F (36.7 C),  temperature source Oral, resp. rate (!) 33, height 5\' 10"  (1.778 m), weight 115 kg, SpO2 99%.    FiO2 (%):  [40 %] 40 %   Intake/Output Summary (Last 24 hours) at 08/22/2023 0947 Last data filed at 08/22/2023 0622 Gross per 24 hour  Intake 1110.76 ml  Output 2200 ml  Net -1089.24 ml   Filed Weights   08/20/23 1101 08/20/23 2246  Weight: 111.6 kg 115 kg    Examination: Physical Exam Constitutional:      Appearance: Normal appearance. He is not ill-appearing.     Comments: Moon facies  Cardiovascular:     Rate and Rhythm: Normal rate and regular rhythm.     Pulses: Normal pulses.  Pulmonary:     Breath sounds: Wheezing (diffuse expiratory wheeze) present.  Neurological:     General: No focal deficit present.     Mental Status: He is alert and oriented to person, place, and time. Mental status is at baseline.    Representative Chest CT images     Assessment & Plan:   #Acute Hypoxic Respiratory Failure #Tracheobronchomalacia/EDAC #Community Acquired Pneumonia  Patient with history of presumed "BOOP" chronically maintained on steroids presenting to the hospital with increased shortness of breath, cough, and sputum production. He was noted to have increased oxygen requirements initially requiring BiPAP and now on nasal cannula.  I have reviewed the patient's record and note the presumptive diagnosis of "BOOP" in 2017 for which he's been maintained chronically on steroids since then. He only has one chest CT from 2017 prior to the diagnosis that showed patching infiltrates,  but otherwise no repeat chest CT has been performed since.  BOOP is an obsolete diagnosis. It is currently replaced by the terms organizing pneumonia (either cryptogenic, or secondary to medication/exposures/post-infectious) and by bronchiolitis obliterans, two separate and distinct disease processes. While he might have had organizing pneumonia in 2017, this disease is not one that would require continuous  steroids for 7 years. There has been no tissue diagnosis to prove organizing pneumonia (or any other process) nor has there been a repeat CT to show recurrent infiltrates. Furthermore, patient has not received PJP prophylaxis while on steroids. There is also no evidence or role for trending ESR to guide prednisone management in any of the above mentioned diseases.  On exam today, he has diffuse expiratory wheeze with associated cough productive of sputum. I have ordered a chest CT in high resolution with inspiratory and expiratory films. I have personally reviewed his chest CT and note severe tracheobronchomalacia that certainly explains his symptoms and presentation. He denies history of asthma, and has not had T-helper cell type 2 response based on previous investigations (only mild allergy to cats, max eosinophils 500 historically). It certainly possible that his prolonged steroid courses resulted in his malacia, or he had underlying malacia that was treated with steroids.  I do not believe that the patient currently has organizing pneumonia or any other ILD. His symptoms are mostly driven by the tracheobronchomalacia/EDAC with likely retained secretions resulting in infection. I would recommend an aggressive pulmonary toilet regimen (hypertonic saline nebs followed by albuterol nebs followed by flutter device followed by incentive spirometry twice daily). He does have a history of OSA and should be maintained on CPAP nocturnally as this will help with stenting the airway open. Unfortunately, his age and comorbidity likely make tracheobronchoplasty extremely risky and I doubt he could tolerate such surgery (nor rigid bronchoscopy for Y-stent placement as a trial). He might benefit from pulmonary rehabilitation and should certainly be encouraged to perform purse lip breathing when he has bouts of cough.  As to the infectious component behind his symptoms, the severe malacia exhibited in the airways can  result in mucus retention and inability to clear his secretions, with increased risk of infection from mucus stasis. He is also immune suppressed given the steroids. I would recommend continued course of antibiotics in addition to obtaining a fungal workup (specifically to rule out PJP). Would obtain beta-d-glucan and aspergillus galactomannan. Patient should be considered for bronchoscopy once more stable.  Finally, I would recommend tapering him off his steroids. His years of steroid use warrants consultation with endocrinology and a very slow taper based on the most recent [employer] Society of Endocrinology guidelines for the diagnosis and therapy of glucocorticoid induced adrenal insufficiency (10.1093/ejendo/lvae029).  -Pulmonary clearance regimen (3% NS nebs, followed by albuterol nebs followed by Flutter followed by IS twice daily). Purse lip breathing, pulmonary rehab -continue broad spectrum antibiotics -beta-d-glucan, aspergillus ag -Advair HFA (115/21) or Symbicort HFA (160-4.5) on discharge -Please taper off prednisone, needs very slow taper  Raechel Chute, MD [facility] 08/22/2023 6:11 PM    Labs   CBC: Recent Labs  Lab 08/20/23 1109 08/21/23 0327 08/22/23 0256  WBC 17.0* 10.0 8.2  NEUTROABS 11.2*  --   --   HGB 12.6* 10.1* 10.8*  HCT 38.7* 31.0* 32.5*  MCV 102.1* 99.0 100.6*  PLT 164 129* 137*    Basic Metabolic Panel: Recent Labs  Lab 08/20/23 1109 08/21/23 0327 08/22/23 0256  NA 139 137 136  K  3.2* 3.6 3.3*  CL 102 104 102  CO2 25 23 22   GLUCOSE 153* 192* 129*  BUN 57* 62* 66*  CREATININE 3.07* 2.91* 2.97*  CALCIUM 8.6* 7.8* 7.8*  MG 1.9  --   --    GFR: Estimated Creatinine Clearance: 25.6 mL/min (A) (by C-G formula based on SCr of 2.97 mg/dL (H)). Recent Labs  Lab 08/20/23 1109 08/20/23 1305 08/20/23 1743 08/21/23 0327 08/22/23 0256  PROCALCITON 3.85  --   --  6.77  --   WBC 17.0*  --   --  10.0 8.2  LATICACIDVEN 3.6* 3.5*  3.1*  --  1.4    Liver Function Tests: Recent Labs  Lab 08/20/23 1109  AST 28  ALT 32  ALKPHOS 40  BILITOT 2.2*  PROT 6.0*  ALBUMIN 3.4*   No results for input(s): "LIPASE", "AMYLASE" in the last 168 hours. No results for input(s): "AMMONIA" in the last 168 hours.  ABG    Component Value Date/Time   HCO3 26.6 08/20/2023 1305   TCO2 20 (L) 09/17/2021 1856   ACIDBASEDEF 7.0 (H) 09/17/2021 1856   O2SAT 60.5 08/20/2023 1305     Coagulation Profile: Recent Labs  Lab 08/20/23 1109  INR 1.2    Cardiac Enzymes: No results for input(s): "CKTOTAL", "CKMB", "CKMBINDEX", "TROPONINI" in the last 168 hours.  HbA1C: Hemoglobin A1C  Date/Time Value Ref Range Status  05/08/2023 11:11 AM 6.3 (A) 4.0 - 5.6 % Final  09/05/2022 01:26 PM 7.9 (A) 4.0 - 5.6 % Final   Hgb A1c MFr Bld  Date/Time Value Ref Range Status  05/17/2020 02:44 PM 6.6 (H) 4.8 - 5.6 % Final    Comment:    (NOTE) Pre diabetes:          5.7%-6.4%  Diabetes:              >6.4%  Glycemic control for   <7.0% adults with diabetes   03/24/2019 09:25 AM 6.3 4.6 - 6.5 % Final    Comment:    Glycemic Control Guidelines for People with Diabetes:Non Diabetic:  <6%Goal of Therapy: <7%Additional Action Suggested:  >8%     CBG: Recent Labs  Lab 08/21/23 0758 08/21/23 1132 08/21/23 1559 08/21/23 2130 08/22/23 0746  GLUCAP 152* 134* 229* 129* 105*    Past Medical History:  He,  has a past medical history of Arthritis, Chronic kidney disease, Complication of anesthesia, Diabetes (HCC), Diverticulitis, GERD (gastroesophageal reflux disease), Gout, Heart attack (HCC), Heart disease, Heart murmur, Hyperglycemia, Hyperlipidemia, Hypertension, Osteoporosis, Pneumonia, and Sleep apnea.   Surgical History:   Past Surgical History:  Procedure Laterality Date   ACROMIO-CLAVICULAR JOINT REPAIR Left 05/05/2019   Procedure: SHOULDER ARTHROSCOPIC ASSISTED ACROMIO-CLAVICULAR JOINT REPAIR;  Surgeon: Jones Broom, MD;   Location: [facility];  Service: Orthopedics;  Laterality: Left;   CATARACT EXTRACTION Bilateral    CORONARY ARTERY BYPASS GRAFT     [place]   LEFT HEART CATH AND CORS/GRAFTS ANGIOGRAPHY N/A 09/19/2021   Procedure: LEFT HEART CATH AND CORS/GRAFTS ANGIOGRAPHY;  Surgeon: Lyn Records, MD;  Location: [facility];  Service: Cardiovascular;  Laterality: N/A;   open heart surgery  09/09/1995-1998   5 bypass   REPAIR KNEE LIGAMENT     right   SHOULDER ARTHROSCOPY Left 05/05/2019   Procedure: ARTHROSCOPY SHOULDER;  Surgeon: Jones Broom, MD;  Location: [facility];  Service: Orthopedics;  Laterality: Left;   TONSILLECTOMY     TOTAL KNEE ARTHROPLASTY Left 05/29/2020   Procedure: LEFT TOTAL  KNEE ARTHROPLASTY;  Surgeon: Marcene Corning, MD;  Location: [facility];  Service: Orthopedics;  Laterality: Left;     Social History:   reports that he quit smoking about 18 years ago. His smoking use included cigars and cigarettes. He started smoking about 21 years ago. He has a 3 pack-year smoking history. He has never used smokeless tobacco. He reports current alcohol use of about 1.0 standard drink of alcohol per week. He reports that he does not use drugs.   Family History:  His family history includes Heart attack (age of onset: 43) in his son; Heart disease in his father and mother; Stroke in his father. There is no history of Kidney disease, Prostate cancer, or Diabetes.   Allergies Allergies  Allergen Reactions   Oxycodone-Acetaminophen Other (See Comments)    Stops breathing; has tolerated Tylenol before   Oxycodone-Acetaminophen Anaphylaxis     Home Medications  Prior to Admission medications   Medication Sig Start Date End Date Taking? Authorizing Provider  allopurinol (ZYLOPRIM) 100 MG tablet TAKE 1 TABLET BY MOUTH TWICE A DAY 06/08/23  Yes Tower, Audrie Gallus, MD  aspirin EC (ASPIRIN 81) 81 MG tablet Take 81 mg by mouth daily. Swallow whole.   Yes [provider]  Calcium  Carbonate-Vit D-Min (CALCIUM-VITAMIN D-MINERALS) 600-800 MG-UNIT CHEW Chew 1 tablet by mouth daily.   Yes [provider]  colchicine 0.6 MG tablet Take 1 tablet (0.6 mg total) by mouth as needed (GOUT FLARE). TAKE 1 TABLET BY MOUTH TWICE A DAY WITH A MEAL AS NEEDED FOR ACUTE FLARE OF GOUT 04/22/23  Yes Tower, Audrie Gallus, MD  Continuous Blood Gluc Receiver (FREESTYLE LIBRE 2 READER) DEVI USE AS INSTRUCTED TO CHECK BLOOD SUGARS. 01/13/22  Yes Romero Belling, MD  Continuous Blood Gluc Sensor (FREESTYLE LIBRE 2 SENSOR) MISC 1 Device by Does not apply route every 14 (fourteen) days. 10/31/22  Yes Carlus Pavlov, MD  cyanocobalamin (VITAMIN B12) 500 MCG tablet Take 500 mcg by mouth daily.   Yes [provider]  ezetimibe (ZETIA) 10 MG tablet TAKE 1 TABLET BY MOUTH EVERY DAY 07/08/23  Yes Tower, Marne A, MD  famotidine (PEPCID) 20 MG tablet TAKE 1 TABLET BY MOUTH EVERYDAY AT BEDTIME 04/10/23  Yes Nyoka Cowden, MD  furosemide (LASIX) 20 MG tablet TAKE 1 TABLET BY MOUTH EVERY DAY 07/08/23  Yes Gollan, Tollie Pizza, MD  gabapentin (NEURONTIN) 100 MG capsule Take 300 mg by mouth daily. 01/04/23  Yes [provider]  Glucosamine HCl (GLUCOSAMINE PO) Take 1,500 mg by mouth 2 (two) times daily.    Yes [provider]  glucose blood (FREESTYLE LITE) test strip 1 each by Other route 4 (four) times daily. E11.65 04/10/20  Yes Romero Belling, MD  insulin isophane & regular human KwikPen (NOVOLIN 70/30 KWIKPEN) (70-30) 100 UNIT/ML KwikPen Inject 30 Units into the skin daily with breakfast. And pen needles 1/day 05/08/23  Yes Carlus Pavlov, MD  insulin lispro (HUMALOG KWIKPEN) 200 UNIT/ML KwikPen Inject 10-14 Units into the skin 2 (two) times daily before a meal. INJECT 12-15 UNITS INTO THE SKIN 2 (TWO) TIMES DAILY BEFORE A MEAL. 05/08/23  Yes Carlus Pavlov, MD  Insulin Pen Needle (BD PEN NEEDLE NANO 2ND GEN) 32G X 4 MM MISC USE 3 TIMES A DAY 01/25/23  Yes Carlus Pavlov, MD  ketoconazole  (NIZORAL) 2 % cream Apply 1 Application topically 2 (two) times daily. 11/10/22  Yes [provider]  Lancets (FREESTYLE) lancets 1 EACH BY OTHER ROUTE 4 (  FOUR) TIMES DAILY. E11.65 08/23/20  Yes Romero Belling, MD  Lutein 20 MG CAPS Take 20 mg by mouth daily.   Yes [provider]  meclizine (ANTIVERT) 12.5 MG tablet TAKE 1-2 TABLETS BY MOUTH THREE TIMES A DAY AS NEEDED FOR DIZZINESS 10/30/22  Yes [provider]  Multiple Vitamins-Minerals (PRESERVISION AREDS 2 PO) Take by mouth.   Yes [provider]  nitroGLYCERIN (NITROSTAT) 0.4 MG SL tablet Place 1 tablet (0.4 mg total) under the tongue every 5 (five) minutes x 3 doses as needed for chest pain. 01/19/23  Yes Gollan, Tollie Pizza, MD  omeprazole (PRILOSEC) 40 MG capsule TAKE 1 CAPUSLE BY MOUTH 30- 60 MIN BEFORE YOUR FIRST AND LAST MEALS OF THE DAY 10/31/22  Yes Nyoka Cowden, MD  ondansetron (ZOFRAN) 8 MG tablet Take 1 tablet (8 mg total) by mouth every 8 (eight) hours as needed for nausea or vomiting. From narcotic pain medicine 01/21/23  Yes Tower, Audrie Gallus, MD  polyethylene glycol (MIRALAX / GLYCOLAX) 17 g packet Take 17 g by mouth daily.   Yes [provider]  predniSONE (DELTASONE) 10 MG tablet TAKE 2 TABLETS BY MOUTH UNTIL BETTER THEN TAPER TO 1/2 TABLET Patient taking differently: Take 5 mg by mouth daily with breakfast. 12/12/22  Yes Nyoka Cowden, MD  predniSONE (DELTASONE) 5 MG tablet 3 daily 07/06/23  Yes Nyoka Cowden, MD  rosuvastatin (CRESTOR) 40 MG tablet TAKE 1 TABLET BY MOUTH EVERY DAY 11/18/22  Yes Tower, Marne A, MD  Semaglutide, 2 MG/DOSE, (OZEMPIC, 2 MG/DOSE,) 8 MG/3ML SOPN Inject 2 mg into the skin once a week. 05/08/23  Yes Carlus Pavlov, MD      I spent 80 minutes caring for this patient today, including preparing to see the patient, obtaining a medical history , reviewing a separately obtained history, performing a medically appropriate examination and/or evaluation, counseling and  educating the patient/family/caregiver, ordering medications, tests, or procedures, referring and communicating with other health care professionals (not separately reported), documenting clinical information in the electronic health record, and independently interpreting results (not separately reported/billed) and communicating results to the patient/family/caregiver

## 2023-08-22 NOTE — Progress Notes (Signed)
Progress Note   Patient: FLYNN FREZZA UXL:[id number] DOB: 1945-09-14 DOA: 08/20/2023     2 DOS: the patient was seen and examined on 08/22/2023   Brief hospital course: Mr. Kastiel Gadbois is a 77 year old male with history of CAD status post CABG in 1997, hypertension, insulin-dependent diabetes mellitus, CKD stage IV, hyperlipidemia, gout, reactive airway symptoms, cryptogenic organizing pneumonia, on chronic daily steroid, polycystic kidney disease, GERD, who presents to the emergency department for chief concerns of shortness of breath.  Patient was hypoxic requiring BiPAP, procalcitonin elevated, admitted to the hospitalist service for further management evaluation of severe sepsis secondary to pneumonia  Assessment and Plan: * Acute hypoxemic respiratory failure (HCC) Left lower lobe Pneumonia Severe sepsis [facility]) Patient meets sepsis criteria, likely severe sepsis Still has tachypnea, BP lower side but improved. Blood cultures x 2 pending. Continue meropenem, azithromycin therapy per pharmacy. MRSA negative, vancomycin discontinued Procalcitonin elevated. He is weaned off 40% now on 4L supplemental oxygen.  Continue to wean oxygen as tolerated. Continue to monitor him closely in stepdown unit.  Pulmonary infiltrates with high ESR c/w BOOP/ idiopathic  IS, Flutter valvue, chest PT ordered. Continue Robitussin, DuoNebs. Tessalon antitussives. Pulmonology consultation for cryptogenic organizing pneumonia management. Will follow recommendations regarding steroids.  Acute on chronic kidney disease stage IIIb: Continue to monitor daily renal function.   Avoid nephrotoxic drugs. Oral lasix home regimen started.  CAD (coronary artery disease) Status post CABG in 1997 Continue rosuvastatin 40 mg nightly, aspirin 81 mg daily. Patient will be continued on oral Lasix therapy.  Essential hypertension Caution with antihypertensives. Blood pressure border line low. Resume  antihypertensives if BP remains elevated.  Hyperlipidemia associated with type 2 diabetes mellitus (HCC) Continue rosuvastatin 40 mg daily, ezetimibe 10 mg daily.  Type 2 diabetes mellitus: NovoLog 70/30 30 units with breakfast. Continue Accu-Cheks, sliding scale insulin. Carb consistent, cardiac diet ordered.  OSA on CPAP.  Advised wife to bring his CPAP machine as hospital CPAP is not fitting him. Obesity with BMI 36.38. Diet, exercise and weight reduction advised.  Out of bed to chair. Incentive spirometry. Nursing supportive care. Fall, aspiration precautions. DVT prophylaxis   Code Status: Limited: Do not attempt resuscitation (DNR) -DNR-LIMITED -Do Not Intubate/DNI   Subjective: Patient is seen and examined today morning.  He is lying in bed, has severe cough, remains tachypneic. Currently on 4L supplemental o2 with saturation above 95%.  Wife at bedside.  Eating fair, had a bowel movement.  Physical Exam: Vitals:   08/22/23 0800 08/22/23 0900 08/22/23 0902 08/22/23 1000  BP: (!) 119/100 102/69 102/69 120/74  Pulse: 76 98 93 99  Resp: 17 (!) 21 20 (!) 27  Temp: (!) 97.4 F (36.3 C)     TempSrc: Oral     SpO2: 94% 94% 95% 98%  Weight:      Height:        General - Elderly obese Caucasian male, moderate respiratory distress HEENT - PERRLA, EOMI, atraumatic head, non tender sinuses. Lung -distant breath sounds, diffuse rales, wheezes, tachypnea, using accessory muscles  Heart - S1, S2 heard, no murmurs, rubs, 2+ pitting pedal edema. Abdomen - Soft, non tender, distended, bowel sounds good Neuro - Alert, awake and oriented x 3, non focal exam. Skin - Warm and dry.  Data Reviewed:      Latest Ref Rng & Units 08/22/2023    2:56 AM 08/21/2023    3:27 AM 08/20/2023   11:09 AM  CBC  WBC 4.0 - 10.5 K/uL 8.2  10.0  17.0   Hemoglobin 13.0 - 17.0 g/dL 16.1  09.6  04.5   Hematocrit 39.0 - 52.0 % 32.5  31.0  38.7   Platelets 150 - 400 K/uL 137  129  164       Latest  Ref Rng & Units 08/22/2023    2:56 AM 08/21/2023    3:27 AM 08/20/2023   11:09 AM  BMP  Glucose 70 - 99 mg/dL 409  811  914   BUN 8 - 23 mg/dL 66  62  57   Creatinine 0.61 - 1.24 mg/dL 7.82  9.56  2.13   Sodium 135 - 145 mmol/L 136  137  139   Potassium 3.5 - 5.1 mmol/L 3.3  3.6  3.2   Chloride 98 - 111 mmol/L 102  104  102   CO2 22 - 32 mmol/L 22  23  25    Calcium 8.9 - 10.3 mg/dL 7.8  7.8  8.6    DG Chest Port 1 View Result Date: 08/20/2023 CLINICAL DATA:  Questionable sepsis - evaluate for abnormality. EXAM: PORTABLE CHEST 1 VIEW COMPARISON:  06/19/2023. FINDINGS: Low lung volume. There is new left basilar opacity with probable trace left pleural effusion. There is minimal blunting of right lateral costophrenic angle as well, which may represent trace right pleural effusion. Bilateral lungs are otherwise clear. Redemonstration of left lateral pleural calcification, nonspecific but commonly seen with asbestos related disease. No pneumothorax on either side. No pulmonary edema. Stable cardio-mediastinal silhouette. No acute osseous abnormalities. The soft tissues are within normal limits. IMPRESSION: *New left basilar opacity, which may represent combination of atelectasis and/or pneumonia. There is associated trace left pleural effusion. Follow up to clearing is recommended. *Probable new trace right pleural effusion.  No pulmonary edema. Electronically Signed   By: Jules Schick M.D.   On: 08/20/2023 13:21   Family Communication: Discussed with patient, wife at bedside.  They understand and agree. All questions answereed.  Disposition: Status is: Inpatient Remains inpatient appropriate because: Sepsis with pneumonia, IV antibiotics, supplemental oxygen  Planned Discharge Destination: Home with Home Health     MDM level 3: Patient is admitted for sepsis, pneumonia, requiring supplemental oxygen.  Patient will need close hemodynamic, neurologic, telemetry monitoring.  He is at high risk  for sudden clinical deterioration.  Author: Marcelino Duster, MD 08/22/2023 10:22 AM Secure chat 7am to 7pm For on call review [email or web address].

## 2023-08-22 NOTE — Progress Notes (Signed)
At approximately 1745 patient transported down to CT with cardiac monitoring, returned back to unit with no events.

## 2023-08-22 NOTE — Progress Notes (Signed)
   08/22/23 1200  Spiritual Encounters  Type of Visit Initial  Care provided to: Pt and family  Referral source Patient request  Reason for visit Routine spiritual support  OnCall Visit Yes  Spiritual Framework  Presenting Themes Courage hope and growth  Patient Stress Factors Health changes  Family Stress Factors None identified  Interventions  Spiritual Care Interventions Made Reflective listening;Prayer   Chaplain responded to Patient request for prayer.

## 2023-08-22 NOTE — Plan of Care (Signed)

## 2023-08-22 NOTE — Progress Notes (Signed)
Patient noted to be tachypnic into the 30s; MD notified who ordered for chest x-ray.

## 2023-08-22 NOTE — Plan of Care (Signed)
Continuing with plan of care. 

## 2023-08-23 DIAGNOSIS — R918 Other nonspecific abnormal finding of lung field: Secondary | ICD-10-CM | POA: Diagnosis not present

## 2023-08-23 DIAGNOSIS — J9601 Acute respiratory failure with hypoxia: Secondary | ICD-10-CM | POA: Diagnosis not present

## 2023-08-23 DIAGNOSIS — A419 Sepsis, unspecified organism: Secondary | ICD-10-CM | POA: Diagnosis not present

## 2023-08-23 DIAGNOSIS — N401 Enlarged prostate with lower urinary tract symptoms: Secondary | ICD-10-CM | POA: Diagnosis not present

## 2023-08-23 LAB — BASIC METABOLIC PANEL
Anion gap: 10 (ref 5–15)
BUN: 68 mg/dL — ABNORMAL HIGH (ref 8–23)
CO2: 26 mmol/L (ref 22–32)
Calcium: 8.1 mg/dL — ABNORMAL LOW (ref 8.9–10.3)
Chloride: 107 mmol/L (ref 98–111)
Creatinine, Ser: 2.96 mg/dL — ABNORMAL HIGH (ref 0.61–1.24)
GFR, Estimated: 21 mL/min — ABNORMAL LOW (ref >60)
Glucose, Bld: 153 mg/dL — ABNORMAL HIGH (ref 70–99)
Potassium: 4.1 mmol/L (ref 3.5–5.1)
Sodium: 141 mmol/L (ref 135–145)

## 2023-08-23 LAB — GLUCOSE, CAPILLARY
Glucose-Capillary: 148 mg/dL — ABNORMAL HIGH (ref 70–99)
Glucose-Capillary: 174 mg/dL — ABNORMAL HIGH (ref 70–99)
Glucose-Capillary: 142 mg/dL — ABNORMAL HIGH (ref 70–99)
Glucose-Capillary: 187 mg/dL — ABNORMAL HIGH (ref 70–99)

## 2023-08-23 LAB — CBC
HCT: 28.3 % — ABNORMAL LOW (ref 39.0–52.0)
Hemoglobin: 9.2 g/dL — ABNORMAL LOW (ref 13.0–17.0)
MCH: 32.3 pg (ref 26.0–34.0)
MCHC: 32.5 g/dL (ref 30.0–36.0)
MCV: 99.3 fL (ref 80.0–100.0)
Platelets: 149 10*3/uL — ABNORMAL LOW (ref 150–400)
RBC: 2.85 MIL/uL — ABNORMAL LOW (ref 4.22–5.81)
RDW: 15.3 % (ref 11.5–15.5)
WBC: 7 10*3/uL (ref 4.0–10.5)
nRBC: 0.4 % — ABNORMAL HIGH (ref 0.0–0.2)

## 2023-08-23 NOTE — Progress Notes (Signed)
Patient has remained on 4L nasal cannula so far throughout this shift.  Patient is alert and oriented x 4, able to verbalize needs, and pain is controlled well with prn morphine.

## 2023-08-23 NOTE — Progress Notes (Signed)
Progress Note   Patient: Kurt Aguilar JJO:[id number] DOB: 12/25/1945 DOA: 08/20/2023     3 DOS: the patient was seen and examined on 08/23/2023   Brief hospital course: Kurt Aguilar is a 77 year old male with history of CAD status post CABG in 1997, hypertension, insulin-dependent diabetes mellitus, CKD stage IV, hyperlipidemia, gout, reactive airway symptoms, cryptogenic organizing pneumonia, on chronic daily steroid, polycystic kidney disease, GERD, who presents to the emergency department for chief concerns of shortness of breath.  Patient was hypoxic requiring BiPAP, procalcitonin elevated, admitted to the hospitalist service for further management evaluation of severe sepsis secondary to pneumonia  Assessment and Plan: * Acute hypoxemic respiratory failure (HCC) Left lower lobe Pneumonia Severe sepsis [facility]) Patient meets sepsis criteria, likely severe sepsis Still has tachypnea, BP lower side but improved. Blood cultures x 2 pending. Continue meropenem, azithromycin therapy per pharmacy. MRSA negative, vancomycin discontinued Procalcitonin elevated. He is weaned off bipap to 40% now on 4L supplemental oxygen.  Continue to wean oxygen as tolerated. Continue to monitor him closely in stepdown unit.  Pulmonary infiltrates with high ESR  Tracheomalacia - IS, Flutter valvue, chest PT ordered. Continue Robitussin, DuoNebs. Tessalon antitussives. CT chest done showed findings of tracheomalacia. Pulmonologist advised  -pulm clearance regimen (3% NS nebs, followed by albuterol nebs followed by Flutter followed by IS twice daily). Purse lip breathing, pulmonary rehab. Steroids to be slowly tapered as he is on them for last 7 years. Risk of adrenal insufficiency high.  Acute on chronic kidney disease stage IIIb: Continue to monitor daily renal function.   Avoid nephrotoxic drugs. Continue oral lasix home regimen.  CAD (coronary artery disease) Status post CABG in  1997 Continue rosuvastatin 40 mg nightly, aspirin 81 mg daily. Patient will be continued on oral Lasix therapy.  Essential hypertension Caution with antihypertensives. Blood pressure border line low. Resume antihypertensives if BP remains elevated.  Hyperlipidemia associated with type 2 diabetes mellitus (HCC) Continue rosuvastatin 40 mg daily, ezetimibe 10 mg daily.  Type 2 diabetes mellitus: NovoLog 70/30 30 units with breakfast. Continue Accu-Cheks, sliding scale insulin. Carb consistent, cardiac diet ordered.  OSA on CPAP.  Advised wife to bring his CPAP machine as hospital CPAP is not fitting him. Obesity with BMI 36.38. Diet, exercise and weight reduction advised.  Out of bed to chair. Incentive spirometry. Nursing supportive care. Fall, aspiration precautions. DVT prophylaxis   Code Status: Limited: Do not attempt resuscitation (DNR) -DNR-LIMITED -Do Not Intubate/DNI   Subjective: Patient is seen and examined today morning.  He is lying in bed, has productive cough, remains tachypneic. Currently on 4-6L supplemental o2 with saturation above 93%.  No family at bedside.  Eating fair.  Physical Exam: Vitals:   08/23/23 1100 08/23/23 1200 08/23/23 1230 08/23/23 1300  BP: 113/78 128/81  113/79  Pulse: 96 98 98 99  Resp: (!) 32 (!) 22 20 19   Temp:  97.6 F (36.4 C)    TempSrc:  Oral    SpO2: 93% 95% 96% 95%  Weight:      Height:        General - Elderly obese Caucasian male, moderate respiratory distress HEENT - PERRLA, EOMI, atraumatic head, non tender sinuses. Lung -distant breath sounds, diffuse rales, wheezes, tachypnea, using accessory muscles  Heart - S1, S2 heard, no murmurs, rubs, 2+ pitting pedal edema. Abdomen - Soft, non tender, distended, bowel sounds good Neuro - Alert, awake and oriented x 3, non focal exam. Skin - Warm and dry.  Data Reviewed:  Latest Ref Rng & Units 08/23/2023    2:48 AM 08/22/2023    2:56 AM 08/21/2023    3:27 AM  CBC   WBC 4.0 - 10.5 K/uL 7.0  8.2  10.0   Hemoglobin 13.0 - 17.0 g/dL 9.2  16.1  09.6   Hematocrit 39.0 - 52.0 % 28.3  32.5  31.0   Platelets 150 - 400 K/uL 149  137  129       Latest Ref Rng & Units 08/23/2023    2:48 AM 08/22/2023    2:56 AM 08/21/2023    3:27 AM  BMP  Glucose 70 - 99 mg/dL 045  409  811   BUN 8 - 23 mg/dL 68  66  62   Creatinine 0.61 - 1.24 mg/dL 9.14  7.82  9.56   Sodium 135 - 145 mmol/L 141  136  137   Potassium 3.5 - 5.1 mmol/L 4.1  3.3  3.6   Chloride 98 - 111 mmol/L 107  102  104   CO2 22 - 32 mmol/L 26  22  23    Calcium 8.9 - 10.3 mg/dL 8.1  7.8  7.8    CT Chest High Resolution Result Date: 08/23/2023 CLINICAL DATA:  77 year old male with history of interstitial lung disease. Shortness of breath. EXAM: CT CHEST WITHOUT CONTRAST TECHNIQUE: Multidetector CT imaging of the chest was performed following the standard protocol without intravenous contrast. High resolution imaging of the lungs, as well as inspiratory and expiratory imaging, was performed. RADIATION DOSE REDUCTION: This exam was performed according to the departmental dose-optimization program which includes automated exposure control, adjustment of the mA and/or kV according to patient size and/or use of iterative reconstruction technique. COMPARISON:  Chest CTA 12/19/2015. FINDINGS: Cardiovascular: Heart size is normal. There is no significant pericardial fluid, thickening or pericardial calcification. There is aortic atherosclerosis, as well as atherosclerosis of the great vessels of the mediastinum and the coronary arteries, including calcified atherosclerotic plaque in the left main, left anterior descending, left circumflex and right coronary arteries. Status post median sternotomy for CABG including LIMA to the LAD. Severe calcifications of the aortic valve and mitral annulus. Mediastinum/Nodes: Multiple prominent borderline enlarged mediastinal and hilar lymph nodes are incidentally noted. No definite  pathologically enlarged lymph nodes are identified. Densely calcified subcarinal and right hilar lymph nodes are also incidentally noted. Esophagus is unremarkable in appearance. No axillary lymphadenopathy. Lungs/Pleura: Small right and trace left pleural effusions lying dependently. Calcified pleural plaques in the left hemithorax. No calcified pleural plaques in the right hemithorax. Patchy multifocal peripheral predominant airspace consolidation throughout the lungs bilaterally, most evident in the dependent portions of the right lower lobe and right upper lobe. High-resolution images also demonstrates some patchy areas of ground-glass attenuation, septal thickening and thickening of the peribronchovascular interstitium, however, today's study is overall limited by motion and the presence of what appears to be acute airspace consolidation, limiting accurate assessment for underlying interstitial lung disease. Inspiratory and expiratory imaging demonstrates severe collapse of the trachea and mainstem bronchi during expiration indicative of tracheobronchomalacia. Upper Abdomen: Partially imaged low-attenuation lesion in the upper left retroperitoneum measuring at least 3 cm in diameter, incompletely characterized on today's noncontrast CT examination, but statistically likely an exophytic cyst in the upper pole of the left kidney (no imaging follow-up recommended). Diffuse low attenuation throughout the visualized hepatic parenchyma, indicative of a background of hepatic steatosis. Musculoskeletal: Median sternotomy wires. There are no aggressive appearing lytic or blastic lesions noted in the visualized  portions of the skeleton. IMPRESSION: 1. Limited examination for assessment of interstitial lung disease based on presence of what appears to be multilobar pneumonia, most severe in the right lower lobe. If there is persistent clinical concern for interstitial lung disease (which is likely present in the  background on today's study), repeat high-resolution chest CT should be performed after resolution of the patient's acute illness on an outpatient basis. 2. Small right and trace left pleural effusions lying dependently. 3. Calcified pleural plaques in the left hemithorax suggesting prior left-sided empyema and/or left-sided thoracic trauma. No right-sided calcified pleural plaques are noted to indicate asbestos related pleural disease at this time. 4. Severe tracheobronchomalacia. 5. Aortic atherosclerosis, in addition to left main and three-vessel coronary artery disease. Status post median sternotomy for CABG including LIMA to the LAD. 6. There are calcifications of the aortic valve and mitral annulus. Echocardiographic correlation for evaluation of potential valvular dysfunction may be warranted if clinically indicated. 7. Additional incidental findings, as above. Aortic Atherosclerosis (ICD10-I70.0). Electronically Signed   By: Trudie Reed M.D.   On: 08/23/2023 06:23   DG Chest 1 View Result Date: 08/22/2023 CLINICAL DATA:  Tachypnea EXAM: CHEST  1 VIEW COMPARISON:  Chest x-ray 08/20/2023 FINDINGS: Cardiomediastinal silhouette is enlarged, unchanged. There is a stable small left pleural effusion with patchy left basilar opacities. Left-sided calcified pleural plaques are again noted. There is no pneumothorax or acute fracture. Sternotomy wires are present. IMPRESSION: 1. Stable small left pleural effusion with patchy left basilar opacities. 2. Left-sided calcified pleural plaques. Electronically Signed   By: Darliss Cheney M.D.   On: 08/22/2023 19:08   Family Communication: Discussed with patient, he understand and agree. All questions answereed.  Disposition: Status is: Inpatient Remains inpatient appropriate because: Sepsis with pneumonia, IV antibiotics, supplemental oxygen  Planned Discharge Destination: Home with Home Health     MDM level 3: Patient is admitted for sepsis, pneumonia,  requiring supplemental oxygen remains tachypneic.  Patient will need close hemodynamic, neurologic, telemetry monitoring.  He is at high risk for sudden clinical deterioration.  Author: Marcelino Duster, MD 08/23/2023 1:57 PM Secure chat 7am to 7pm For on call review [email or web address].

## 2023-08-23 NOTE — Plan of Care (Signed)
Continuing with plan of care. 

## 2023-08-23 NOTE — TOC Initial Note (Signed)
Transition of Care [facility]) - Initial/Assessment Note    Patient Details  Name: Kurt Aguilar MRN: [id number] Date of Birth: Feb 15, 1946  Transition of Care [facility]) CM/SW Contact:    Colette Ribas, LCSWA Phone Number: 08/23/2023, 11:43 AM  Clinical Narrative:                    CSW spoke with patient he advised he is from [facility]. PCP onfile is correct. [facility]. He advised he has RW & BSC. CSW Explained role and advised patient to reach out for any needs.     Patient Goals and CMS Choice            Expected Discharge Plan and Services                                              Prior Living Arrangements/Services                       Activities of Daily Living   ADL Screening (condition at time of admission) Independently performs ADLs?: Yes (appropriate for developmental age) Is the patient deaf or have difficulty hearing?: No Does the patient have difficulty seeing, even when wearing glasses/contacts?: No Does the patient have difficulty concentrating, remembering, or making decisions?: No  Permission Sought/Granted                  Emotional Assessment              Admission diagnosis:  Acute respiratory failure with hypoxia (HCC) [J96.01] Acute hypoxemic respiratory failure (HCC) [J96.01] Community acquired pneumonia of left lower lobe of lung [J18.9] Patient Active Problem List   Diagnosis Date Noted   Acute hypoxemic respiratory failure (HCC) 08/20/2023   Muscular chest pain 08/20/2023   Vitamin B12 deficiency 04/22/2023   CKD (chronic kidney disease) stage 4, GFR 15-29 ml/min (HCC) 04/22/2023   Current use of proton pump inhibitor 04/14/2023   Nausea 01/21/2023   Immunocompromised due to corticosteroids (HCC) 10/09/2022   Severe sepsis (HCC) 08/17/2022   Lumbar pain 01/30/2022   Chronic diastolic heart failure (HCC) 10/23/2021   Stage 3b chronic kidney disease (CKD) (HCC) 09/17/2021   Osteopenia  05/16/2021   Steroid dependent (HCC) 04/12/2021   Primary osteoarthritis of left knee 05/29/2020   Colon cancer screening 02/13/2017   Poorly controlled type 2 diabetes mellitus with circulatory disorder (HCC) 08/13/2016   Chronic heel pain, left 08/12/2016   Upper airway cough syndrome 07/31/2016   Class 1 obesity with serious comorbidity and body mass index (BMI) of 32.0 to 32.9 in adult 05/19/2016   Encounter for general adult medical examination with abnormal findings 01/27/2016   Pulmonary infiltrates with high  ESR c/w BOOP/ idiopathic  12/19/2015   Hypotension 12/12/2015   Cough 11/29/2015   Cystic kidney disease 10/01/2015   Renal lesion 09/27/2015   Erectile dysfunction of organic origin 09/27/2015   BPH with obstruction/lower urinary tract symptoms 09/27/2015   Constipation 09/24/2015   Cyst of right kidney 09/24/2015   CAD (coronary artery disease) 06/08/2015   Grieving 11/22/2014   Encounter for Medicare annual wellness exam 07/11/2013   Prostate cancer screening 07/03/2013   Right bundle branch block 02/22/2013   Chronic low back pain 05/13/2011   Obstructive sleep apnea 03/15/2010   Hyperlipidemia associated with type 2  diabetes mellitus (HCC) 04/12/2009   Gout 04/12/2009   Essential hypertension 04/12/2009   Coronary artery disease of bypass graft of native heart with stable angina pectoris (HCC) 04/12/2009   GERD 04/12/2009   DIVERTICULITIS, HX OF 04/12/2009   PCP:  Judy Pimple, MD Pharmacy:   [phone] [place], [place] - [address] [place] [address] Phone: [phone] Fax: [phone]  [facility] - [facility] [address], [address] [place] [address] Phone: [phone] Fax: [phone]     Social Drivers of Health (SDOH) Social History: SDOH Screenings   Food Insecurity: No Food Insecurity (08/21/2023)  Housing: Low Risk  (08/21/2023)  Transportation Needs:  No Transportation Needs (08/21/2023)  Utilities: Not At Risk (08/21/2023)  Alcohol Screen: Low Risk  (04/30/2023)  Depression (PHQ2-9): Low Risk  (04/30/2023)  Financial Resource Strain: Low Risk  (04/30/2023)  Physical Activity: Insufficiently Active (04/30/2023)  Social Connections: Socially Integrated (04/30/2023)  Stress: No Stress Concern Present (04/30/2023)  Tobacco Use: Medium Risk (08/20/2023)  Health Literacy: Adequate Health Literacy (04/30/2023)   SDOH Interventions:     Readmission Risk Interventions    09/20/2021    9:36 AM  Readmission Risk Prevention Plan  Transportation Screening Complete  Medication Review (RN Care Manager) Referral to Pharmacy  PCP or Specialist appointment within 3-5 days of discharge Not Complete  PCP/Specialist Appt Not Complete comments pending patient is not medically stable but does have a PCP  HRI or Home Care Consult Complete  SW Recovery Care/Counseling Consult Complete  Palliative Care Screening Not Applicable  Skilled Nursing Facility Not Applicable

## 2023-08-23 NOTE — Evaluation (Signed)
Physical Therapy Evaluation Patient Details Name: Kurt Aguilar MRN: [id number] DOB: 10/23/1945 Today's Date: 08/23/2023  History of Present Illness  Patient is a 77 year old male with Acute hypoxemic respiratory failure, left lower lobe pneumonia, severe sepsis. History of CAD, CABG, HTN, diabetes mellitus, CKD, hyperlipidemia, gout, polycystic kidney disease, GERD, pneumonia, sleep apnea.  Clinical Impression  Patient is agreeable to PT evaluation. Supportive spouse at the bedside. They [facility] in a [place] at [facility]. The patient is Mod I at baseline with cane for household ambulation and 4 wheeled walker for community mobility.  Today, the patient required Min A for bed mobility. Mild dizziness initially with sitting upright that subsides. Patient was able to stand and ambulate 23ft with rolling walker with cues for pursed lip breathing and pacing for endurance. Several standing rest breaks required. Sp02 down to 87% briefly on 4 L02 and in the 90's at rest. Recommend to continue PT to maximize independence and decrease caregiver burden. Rehabilitation < 3 hours/day recommended after this hospital stay.       If plan is discharge home, recommend the following: A little help with walking and/or transfers;A little help with bathing/dressing/bathroom;Assistance with cooking/housework;Help with stairs or ramp for entrance   Can travel by private vehicle   Yes    Equipment Recommendations None recommended by PT  Recommendations for Other Services       Functional Status Assessment Patient has had a recent decline in their functional status and demonstrates the ability to make significant improvements in function in a reasonable and predictable amount of time.     Precautions / Restrictions Precautions Precautions: Fall Precaution Comments: monitor Sp02 Restrictions Weight Bearing Restrictions Per Provider Order: No      Mobility  Bed Mobility Overal bed mobility: Needs  Assistance Bed Mobility: Supine to Sit, Sit to Supine     Supine to sit: Min assist Sit to supine: Min assist   General bed mobility comments: assist for trunk to sit up. assistance for BLE to return to bed.    Transfers Overall transfer level: Needs assistance Equipment used: Rolling walker (2 wheels) Transfers: Sit to/from Stand Sit to Stand: Mod assist           General transfer comment: lifting assistance required for standing. cues for technique    Ambulation/Gait Ambulation/Gait assistance: Contact guard assist Gait Distance (Feet): 20 Feet Assistive device: Rolling walker (2 wheels) Gait Pattern/deviations: Decreased stride length, Step-through pattern, Trunk flexed Gait velocity: decreased     General Gait Details: several standing rest breaks required with ambulation to door and back to bed. cues for pursed lip breathing techniques and pacing for endurance. Sp02 down to 87% briefly with ambulation on 4 L02.  Stairs            Wheelchair Mobility     Tilt Bed    Modified Rankin (Stroke Patients Only)       Balance Overall balance assessment: Needs assistance Sitting-balance support: Feet supported Sitting balance-Leahy Scale: Fair     Standing balance support: Bilateral upper extremity supported Standing balance-Leahy Scale: Poor Standing balance comment: rolling walker required for external support                             Pertinent Vitals/Pain Pain Assessment Pain Assessment: No/denies pain    Home Living Family/patient expects to be discharged to:: Private residence Living Arrangements: Spouse/significant other Available Help at Discharge: Family Type of Home:  Independent living facility [facility]) Home Access: Level entry       Home Layout: One level Home Equipment: Rollator (4 wheels);Cane - single point;Shower seat      Prior Function Prior Level of Function : Independent/Modified Independent              Mobility Comments: cane for household ambulation, 4 wheeled walker for community ADLs Comments: Mod I     Extremity/Trunk Assessment   Upper Extremity Assessment Upper Extremity Assessment: Generalized weakness    Lower Extremity Assessment Lower Extremity Assessment: Generalized weakness       Communication   Communication Communication: No apparent difficulties  Cognition Arousal: Alert Behavior During Therapy: WFL for tasks assessed/performed Overall Cognitive Status: Within Functional Limits for tasks assessed                                          General Comments General comments (skin integrity, edema, etc.): vitals monitored throughout session. patient reports mid dizziness with sitting upright with MAP in the 80's    Exercises     Assessment/Plan    PT Assessment Patient needs continued PT services  PT Problem List Decreased strength;Decreased range of motion;Decreased activity tolerance;Decreased mobility;Decreased balance;Decreased safety awareness;Cardiopulmonary status limiting activity       PT Treatment Interventions DME instruction;Gait training;Stair training;Functional mobility training;Therapeutic exercise;Therapeutic activities;Balance training;Neuromuscular re-education;Cognitive remediation;Patient/family education    PT Goals (Current goals can be found in the Care Plan section)  Acute Rehab PT Goals Patient Stated Goal: to be home by next Sunday for Christmas party PT Goal Formulation: With patient/family Time For Goal Achievement: 09/06/23 Potential to Achieve Goals: Good    Frequency Min 1X/week     Co-evaluation               AM-PAC PT "6 Clicks" Mobility  Outcome Measure Help needed turning from your back to your side while in a flat bed without using bedrails?: A Little Help needed moving from lying on your back to sitting on the side of a flat bed without using bedrails?: A Lot Help needed moving to  and from a bed to a chair (including a wheelchair)?: A Lot Help needed standing up from a chair using your arms (e.g., wheelchair or bedside chair)?: A Lot Help needed to walk in hospital room?: A Little Help needed climbing 3-5 steps with a railing? : Total 6 Click Score: 13    End of Session Equipment Utilized During Treatment: Oxygen Activity Tolerance: Patient limited by fatigue Patient left: in bed;with call bell/phone within reach;with family/visitor present Nurse Communication: Mobility status PT Visit Diagnosis: Unsteadiness on feet (R26.81);Muscle weakness (generalized) (M62.81)    Time: 9563-8756 PT Time Calculation (min) (ACUTE ONLY): 42 min   Charges:   PT Evaluation $PT Eval High Complexity: 1 High PT Treatments $Therapeutic Activity: 8-22 mins PT General Charges $$ ACUTE PT VISIT: 1 Visit         Donna Bernard, PT, MPT   Ina Homes 08/23/2023, 11:26 AM

## 2023-08-24 ENCOUNTER — Ambulatory Visit: Payer: PPO | Admitting: Cardiovascular Disease

## 2023-08-24 DIAGNOSIS — J9601 Acute respiratory failure with hypoxia: Secondary | ICD-10-CM | POA: Diagnosis not present

## 2023-08-24 DIAGNOSIS — I5032 Chronic diastolic (congestive) heart failure: Secondary | ICD-10-CM

## 2023-08-24 DIAGNOSIS — I2510 Atherosclerotic heart disease of native coronary artery without angina pectoris: Secondary | ICD-10-CM

## 2023-08-24 DIAGNOSIS — R000 Tachycardia, unspecified: Secondary | ICD-10-CM

## 2023-08-24 DIAGNOSIS — I25118 Atherosclerotic heart disease of native coronary artery with other forms of angina pectoris: Secondary | ICD-10-CM

## 2023-08-24 DIAGNOSIS — N1832 Chronic kidney disease, stage 3b: Secondary | ICD-10-CM

## 2023-08-24 DIAGNOSIS — E1122 Type 2 diabetes mellitus with diabetic chronic kidney disease: Secondary | ICD-10-CM

## 2023-08-24 DIAGNOSIS — R55 Syncope and collapse: Secondary | ICD-10-CM

## 2023-08-24 DIAGNOSIS — E1169 Type 2 diabetes mellitus with other specified complication: Secondary | ICD-10-CM

## 2023-08-24 LAB — GLUCOSE, CAPILLARY
Glucose-Capillary: 123 mg/dL — ABNORMAL HIGH (ref 70–99)
Glucose-Capillary: 205 mg/dL — ABNORMAL HIGH (ref 70–99)
Glucose-Capillary: 102 mg/dL — ABNORMAL HIGH (ref 70–99)
Glucose-Capillary: 219 mg/dL — ABNORMAL HIGH (ref 70–99)

## 2023-08-24 LAB — CULTURE, RESPIRATORY W GRAM STAIN

## 2023-08-24 MED ORDER — SODIUM CHLORIDE 0.9 % IV SOLN
2.0000 g | INTRAVENOUS | Status: DC
  Administered 2023-08-24 – 2023-08-25 (×2): 2 g via INTRAVENOUS
  Filled 2023-08-24 (×3): qty 20

## 2023-08-24 MED ORDER — ACETAMINOPHEN 325 MG PO TABS: 650.0000 mg | ORAL_TABLET | Freq: Four times a day (QID) | ORAL | Status: AC | PRN

## 2023-08-24 NOTE — Progress Notes (Signed)
Progress Note   Patient: Kurt Aguilar NWG:[id number] DOB: 01/29/1946 DOA: 08/20/2023     4 DOS: the patient was seen and examined on 08/24/2023   Brief hospital course: [name] is a 77 year old male with history of CAD status post CABG in 1997, hypertension, insulin-dependent diabetes mellitus, CKD stage IV, hyperlipidemia, gout, reactive airway symptoms, cryptogenic organizing pneumonia, on chronic daily steroid, polycystic kidney disease, GERD, who presents to the emergency department for chief concerns of shortness of breath.  Patient was hypoxic requiring BiPAP, procalcitonin elevated, admitted to the hospitalist service for further management evaluation of severe sepsis secondary to pneumonia  Assessment and Plan: * Acute hypoxemic respiratory failure (HCC) Left lower lobe Pneumonia Severe sepsis [facility]) Patient meets sepsis criteria, likely severe sepsis Still has tachypnea, BP lower side but improved. Blood cultures x 2 pending. MRSA negative, vancomycin discontinued Procalcitonin elevated. He is weaned off bipap to 40% now on 4L supplemental oxygen.   --received 5 days of meropenem and azithromycin.  Sputum with GPC, no Staph aureus or Pseudomonas seen. --transition to ceftriaxone for 2 more days --Continue supplemental O2 to keep sats >=90%, wean as tolerated  Pulmonary infiltrates with high ESR  Tracheomalacia - IS, Flutter valvue, chest PT ordered. Continue Robitussin, DuoNebs. Tessalon antitussives. CT chest done showed findings of tracheomalacia. Pulmonologist advised  -pulm clearance regimen (3% NS nebs, followed by albuterol nebs followed by Flutter followed by IS twice daily). Purse lip breathing, pulmonary rehab. --cont prednisone 20 mg daily, need very slow taper  Acute on chronic kidney disease stage IIIb: Continue to monitor daily renal function.   Avoid nephrotoxic drugs.  CAD (coronary artery disease) Status post CABG in 1997 Continue  rosuvastatin 40 mg nightly, aspirin 81 mg daily.  Essential hypertension Caution with antihypertensives. Blood pressure border line low. --cont oral lasix  Hyperlipidemia associated with type 2 diabetes mellitus (HCC) Continue rosuvastatin 40 mg daily, ezetimibe 10 mg daily.  Type 2 diabetes mellitus: NovoLog 70/30 30 units with breakfast. --ACHS and SSI  Hypokalemia --monitor and supplement PRN  OSA on CPAP.  Advised wife to bring his CPAP machine as hospital CPAP is not fitting him. Obesity with BMI 36.38. Diet, exercise and weight reduction advised.  Out of bed to chair. Incentive spirometry. Nursing supportive care. Fall, aspiration precautions. DVT prophylaxis: heparin injection   Code Status: Limited: Do not attempt resuscitation (DNR) -DNR-LIMITED -Do Not Intubate/DNI   Subjective:  Pt reported shortness of breath just moving around in bed.  Coughing up blood-tinged sputum.   Physical Exam: Vitals:   08/24/23 0849 08/24/23 1306 08/24/23 1532 08/24/23 1728  BP: (!) 114/55 123/72  120/89  Pulse: 95 (!) 116 (!) 112 (!) 102  Resp: 18 18  18   Temp: 98 F (36.7 C) 98 F (36.7 C)  99 F (37.2 C)  TempSrc:      SpO2: 94% 100% 95% 97%  Weight:      Height:        Constitutional: NAD, AAOx3 HEENT: conjunctivae and lids normal, EOMI CV: No cyanosis.   RESP: normal respiratory effort, audible wheezing, on 4L Neuro: II - XII grossly intact.   Psych: Normal mood and affect.  Appropriate judgement and reason   Data Reviewed:      Latest Ref Rng & Units 08/23/2023    2:48 AM 08/22/2023    2:56 AM 08/21/2023    3:27 AM  CBC  WBC 4.0 - 10.5 K/uL 7.0  8.2  10.0   Hemoglobin 13.0 -  17.0 g/dL 9.2  27.2  53.6   Hematocrit 39.0 - 52.0 % 28.3  32.5  31.0   Platelets 150 - 400 K/uL 149  137  129       Latest Ref Rng & Units 08/23/2023    2:48 AM 08/22/2023    2:56 AM 08/21/2023    3:27 AM  BMP  Glucose 70 - 99 mg/dL 644  034  742   BUN 8 - 23 mg/dL 68  66  62    Creatinine 0.61 - 1.24 mg/dL 5.95  6.38  7.56   Sodium 135 - 145 mmol/L 141  136  137   Potassium 3.5 - 5.1 mmol/L 4.1  3.3  3.6   Chloride 98 - 111 mmol/L 107  102  104   CO2 22 - 32 mmol/L 26  22  23    Calcium 8.9 - 10.3 mg/dL 8.1  7.8  7.8    No results found.  Family Communication:  Wife updated at bedside today.  Disposition: Status is: Inpatient Remains inpatient appropriate because: Sepsis with pneumonia, IV antibiotics, supplemental oxygen  Planned Discharge Destination: Rehab     MDM level 3:   Author: Darlin Priestly, MD 08/24/2023 8:23 PM Secure chat 7am to 7pm For on call review [email or web address].

## 2023-08-24 NOTE — TOC Progression Note (Signed)
Transition of Care [facility]) - Progression Note    Patient Details  Name: Kurt Aguilar MRN: [id number] Date of Birth: [birth year]  Transition of Care [facility]) CM/SW Contact  Margarito Liner, LCSW Phone Number: 08/24/2023, 2:45 PM  Clinical Narrative: CSW met with patient and wife at bedside, introduced role, and explained that PT recommendations would be discussed. They are agreeable to going to the SNF side at [facility]. CSW sent referral.    Expected Discharge Plan and Services                                               Social Determinants of Health (SDOH) Interventions SDOH Screenings   Food Insecurity: No Food Insecurity (08/21/2023)  Housing: Low Risk  (08/21/2023)  Transportation Needs: No Transportation Needs (08/21/2023)  Utilities: Not At Risk (08/21/2023)  Alcohol Screen: Low Risk  (04/30/2023)  Depression (PHQ2-9): Low Risk  (04/30/2023)  Financial Resource Strain: Low Risk  (04/30/2023)  Physical Activity: Insufficiently Active (04/30/2023)  Social Connections: Socially Integrated (04/30/2023)  Stress: No Stress Concern Present (04/30/2023)  Tobacco Use: Medium Risk (08/20/2023)  Health Literacy: Adequate Health Literacy (04/30/2023)    Readmission Risk Interventions    09/20/2021    9:36 AM  Readmission Risk Prevention Plan  Transportation Screening Complete  Medication Review (RN Care Manager) Referral to Pharmacy  PCP or Specialist appointment within 3-5 days of discharge Not Complete  PCP/Specialist Appt Not Complete comments pending patient is not medically stable but does have a PCP  HRI or Home Care Consult Complete  SW Recovery Care/Counseling Consult Complete  Palliative Care Screening Not Applicable  Skilled Nursing Facility Not Applicable

## 2023-08-24 NOTE — NC FL2 (Signed)
[place] MEDICAID FL2 LEVEL OF CARE FORM     IDENTIFICATION  Patient Name: Kurt Aguilar Birthdate: 03-Jan-1946 Sex: male Admission Date (Current Location): 08/20/2023  [facility] and [place] Number:  Chiropodist and Address:  [facility], [address] [place], [place] [address]      Provider Number: [id number]  Attending Physician Name and Address:  Darlin Priestly, MD  Relative Name and Phone Number:       Current Level of Care: Hospital Recommended Level of Care: Skilled Nursing Facility Prior Approval Number:    Date Approved/Denied:   PASRR Number: [id number]  Discharge Plan: SNF    Current Diagnoses: Patient Active Problem List   Diagnosis Date Noted   Acute hypoxemic respiratory failure (HCC) 08/20/2023   Muscular chest pain 08/20/2023   Vitamin B12 deficiency 04/22/2023   CKD (chronic kidney disease) stage 4, GFR 15-29 ml/min (HCC) 04/22/2023   Current use of proton pump inhibitor 04/14/2023   Nausea 01/21/2023   Immunocompromised due to corticosteroids (HCC) 10/09/2022   Severe sepsis (HCC) 08/17/2022   Lumbar pain 01/30/2022   Chronic diastolic heart failure (HCC) 10/23/2021   Stage 3b chronic kidney disease (CKD) (HCC) 09/17/2021   Osteopenia 05/16/2021   Steroid dependent (HCC) 04/12/2021   Primary osteoarthritis of left knee 05/29/2020   Colon cancer screening 02/13/2017   Poorly controlled type 2 diabetes mellitus with circulatory disorder (HCC) 08/13/2016   Chronic heel pain, left 08/12/2016   Upper airway cough syndrome 07/31/2016   Class 1 obesity with serious comorbidity and body mass index (BMI) of 32.0 to 32.9 in adult 05/19/2016   Encounter for general adult medical examination with abnormal findings 01/27/2016   Pulmonary infiltrates with high  ESR c/w BOOP/ idiopathic  12/19/2015   Hypotension 12/12/2015   Cough 11/29/2015   Cystic kidney disease 10/01/2015   Renal lesion 09/27/2015   Erectile  dysfunction of organic origin 09/27/2015   BPH with obstruction/lower urinary tract symptoms 09/27/2015   Constipation 09/24/2015   Cyst of right kidney 09/24/2015   CAD (coronary artery disease) 06/08/2015   Grieving 11/22/2014   Encounter for Medicare annual wellness exam 07/11/2013   Prostate cancer screening 07/03/2013   Right bundle branch block 02/22/2013   Chronic low back pain 05/13/2011   Obstructive sleep apnea 03/15/2010   Hyperlipidemia associated with type 2 diabetes mellitus (HCC) 04/12/2009   Gout 04/12/2009   Essential hypertension 04/12/2009   Coronary artery disease of bypass graft of native heart with stable angina pectoris (HCC) 04/12/2009   GERD 04/12/2009   DIVERTICULITIS, HX OF 04/12/2009    Orientation RESPIRATION BLADDER Height & Weight     Self, Time, Situation, Place  O2, Other (Comment) (Nasal Cannula 4 L. Has home CPAP.) Continent Weight: 258 lb 9.6 oz (117.3 kg) Height:  5\' 10"  (177.8 cm)  BEHAVIORAL SYMPTOMS/MOOD NEUROLOGICAL BOWEL NUTRITION STATUS   (None)  (None) Continent Diet (Heart healthy/carb modified.)  AMBULATORY STATUS COMMUNICATION OF NEEDS Skin   Limited Assist Verbally Bruising                       Personal Care Assistance Level of Assistance  Bathing, Feeding, Dressing Bathing Assistance: Limited assistance Feeding assistance: Limited assistance Dressing Assistance: Limited assistance     Functional Limitations Info  Sight, Hearing, Speech Sight Info: Adequate Hearing Info: Adequate Speech Info: Adequate    SPECIAL CARE FACTORS FREQUENCY  PT (By licensed PT), OT (By licensed OT)  PT Frequency: 5 x week OT Frequency: 5 x week            Contractures Contractures Info: Not present    Additional Factors Info  Code Status, Allergies Code Status Info: DNR Allergies Info: Oxycodone-Acetaminophen           Current Medications (08/24/2023):  This is the current hospital active medication list Current  Facility-Administered Medications  Medication Dose Route Frequency Provider Last Rate Last Admin   acetaminophen (TYLENOL) tablet 650 mg  650 mg Oral Q6H PRN Ronnald Ramp, RPH       albuterol (PROVENTIL) (2.5 MG/3ML) 0.083% nebulizer solution 2.5 mg  2.5 mg Nebulization BID Raechel Chute, MD   2.5 mg at 08/24/23 4540   allopurinol (ZYLOPRIM) tablet 100 mg  100 mg Oral BID Cox, Amy N, DO   100 mg at 08/24/23 0901   aspirin EC tablet 81 mg  81 mg Oral Daily Cox, Amy N, DO   81 mg at 08/24/23 0900   azithromycin (ZITHROMAX) 500 mg in sodium chloride 0.9 % 250 mL IVPB  500 mg Intravenous Q24H Cox, Amy N, DO 250 mL/hr at 08/24/23 1422 500 mg at 08/24/23 1422   benzonatate (TESSALON) capsule 200 mg  200 mg Oral TID Marcelino Duster, MD   200 mg at 08/24/23 0900   cefTRIAXone (ROCEPHIN) 2 g in sodium chloride 0.9 % 100 mL IVPB  2 g Intravenous Q24H Darlin Priestly, MD       Chlorhexidine Gluconate Cloth 2 % PADS 6 each  6 each Topical Daily Cox, Amy N, DO   6 each at 08/24/23 9811   cyanocobalamin (VITAMIN B12) tablet 500 mcg  500 mcg Oral Daily Cox, Amy N, DO   500 mcg at 08/24/23 0901   diphenhydrAMINE (BENADRYL) injection 25 mg  25 mg Intravenous Q6H PRN Cox, Amy N, DO       ezetimibe (ZETIA) tablet 10 mg  10 mg Oral Daily Cox, Amy N, DO   10 mg at 08/24/23 0900   famotidine (PEPCID) tablet 20 mg  20 mg Oral QHS Cox, Amy N, DO   20 mg at 08/23/23 2151   furosemide (LASIX) tablet 20 mg  20 mg Oral Daily Marcelino Duster, MD   20 mg at 08/24/23 0901   gabapentin (NEURONTIN) capsule 300 mg  300 mg Oral QHS Cox, Amy N, DO   300 mg at 08/23/23 2151   heparin injection 5,000 Units  5,000 Units Subcutaneous Q8H Cox, Amy N, DO   5,000 Units at 08/24/23 1301   hydrALAZINE (APRESOLINE) injection 5 mg  5 mg Intravenous Q6H PRN Cox, Amy N, DO       insulin aspart (novoLOG) injection 0-5 Units  0-5 Units Subcutaneous QHS Cox, Amy N, DO   2 Units at 08/20/23 2151   insulin aspart (novoLOG) injection 0-9  Units  0-9 Units Subcutaneous TID WC Cox, Amy N, DO   3 Units at 08/24/23 1301   insulin aspart protamine- aspart (NOVOLOG MIX 70/30) injection 30 Units  30 Units Subcutaneous Q breakfast Cox, Amy N, DO   30 Units at 08/24/23 1039   meclizine (ANTIVERT) tablet 12.5 mg  12.5 mg Oral TID PRN Cox, Amy N, DO       metoprolol tartrate (LOPRESSOR) injection 5 mg  5 mg Intravenous Q4H PRN Cox, Amy N, DO       nitroGLYCERIN (NITROSTAT) SL tablet 0.4 mg  0.4 mg Sublingual Q5 Min x 3 PRN Cox, Amy N,  DO       ondansetron (ZOFRAN) tablet 4 mg  4 mg Oral Q6H PRN Cox, Amy N, DO       Or   ondansetron (ZOFRAN) injection 4 mg  4 mg Intravenous Q6H PRN Cox, Amy N, DO       Oral care mouth rinse  15 mL Mouth Rinse PRN Cox, Amy N, DO       polyethylene glycol (MIRALAX / GLYCOLAX) packet 17 g  17 g Oral BID PRN Cox, Amy N, DO       predniSONE (DELTASONE) tablet 20 mg  20 mg Oral Q breakfast Cox, Amy N, DO   20 mg at 08/24/23 0901   rosuvastatin (CRESTOR) tablet 40 mg  40 mg Oral Daily Cox, Amy N, DO   40 mg at 08/24/23 0900   senna-docusate (Senokot-S) tablet 1 tablet  1 tablet Oral QHS Cox, Amy N, DO   1 tablet at 08/23/23 2151   sodium chloride HYPERTONIC 3 % nebulizer solution 4 mL  4 mL Nebulization BID Raechel Chute, MD   4 mL at 08/24/23 4098     Discharge Medications: Please see discharge summary for a list of discharge medications.  Relevant Imaging Results:  Relevant Lab Results:   Additional Information SS#: [id number]  Margarito Liner, LCSW

## 2023-08-24 NOTE — Plan of Care (Signed)
  Problem: Education: Goal: Ability to describe self-care measures that may prevent or decrease complications (Diabetes Survival Skills Education) will improve Outcome: Progressing Goal: Individualized Educational Video(s) Outcome: Progressing   Problem: Coping: Goal: Ability to adjust to condition or change in health will improve Outcome: Progressing   Problem: Fluid Volume: Goal: Ability to maintain a balanced intake and output will improve Outcome: Progressing   Problem: Health Behavior/Discharge Planning: Goal: Ability to identify and utilize available resources and services will improve Outcome: Progressing Goal: Ability to manage health-related needs will improve Outcome: Progressing   Problem: Metabolic: Goal: Ability to maintain appropriate glucose levels will improve Outcome: Progressing   Problem: Nutritional: Goal: Maintenance of adequate nutrition will improve Outcome: Progressing Goal: Progress toward achieving an optimal weight will improve Outcome: Progressing   Problem: Skin Integrity: Goal: Risk for impaired skin integrity will decrease Outcome: Progressing   Problem: Tissue Perfusion: Goal: Adequacy of tissue perfusion will improve Outcome: Progressing   Problem: Fluid Volume: Goal: Hemodynamic stability will improve Outcome: Progressing

## 2023-08-25 ENCOUNTER — Telehealth: Payer: Self-pay | Admitting: Cardiovascular Disease

## 2023-08-25 DIAGNOSIS — J9601 Acute respiratory failure with hypoxia: Secondary | ICD-10-CM | POA: Diagnosis not present

## 2023-08-25 LAB — BASIC METABOLIC PANEL
Anion gap: 12 (ref 5–15)
BUN: 74 mg/dL — ABNORMAL HIGH (ref 8–23)
CO2: 23 mmol/L (ref 22–32)
Calcium: 8.7 mg/dL — ABNORMAL LOW (ref 8.9–10.3)
Chloride: 106 mmol/L (ref 98–111)
Creatinine, Ser: 3.04 mg/dL — ABNORMAL HIGH (ref 0.61–1.24)
GFR, Estimated: 20 mL/min — ABNORMAL LOW (ref >60)
Glucose, Bld: 103 mg/dL — ABNORMAL HIGH (ref 70–99)
Potassium: 5.4 mmol/L — ABNORMAL HIGH (ref 3.5–5.1)
Sodium: 141 mmol/L (ref 135–145)

## 2023-08-25 LAB — CBC
HCT: 34.3 % — ABNORMAL LOW (ref 39.0–52.0)
Hemoglobin: 11.4 g/dL — ABNORMAL LOW (ref 13.0–17.0)
MCH: 32.5 pg (ref 26.0–34.0)
MCHC: 33.2 g/dL (ref 30.0–36.0)
MCV: 97.7 fL (ref 80.0–100.0)
Platelets: 218 10*3/uL (ref 150–400)
RBC: 3.51 MIL/uL — ABNORMAL LOW (ref 4.22–5.81)
RDW: 15.5 % (ref 11.5–15.5)
WBC: 12.1 10*3/uL — ABNORMAL HIGH (ref 4.0–10.5)
nRBC: 0.5 % — ABNORMAL HIGH (ref 0.0–0.2)

## 2023-08-25 LAB — CRYPTOCOCCUS ANTIGEN, SERUM: Cryptococcus Antigen, Serum: NEGATIVE

## 2023-08-25 LAB — CULTURE, BLOOD (ROUTINE X 2)
Culture: NO GROWTH
Culture: NO GROWTH
Special Requests: ADEQUATE
Special Requests: ADEQUATE

## 2023-08-25 LAB — POTASSIUM: Potassium: 4.3 mmol/L (ref 3.5–5.1)

## 2023-08-25 LAB — GLUCOSE, CAPILLARY
Glucose-Capillary: 187 mg/dL — ABNORMAL HIGH (ref 70–99)
Glucose-Capillary: 193 mg/dL — ABNORMAL HIGH (ref 70–99)
Glucose-Capillary: 133 mg/dL — ABNORMAL HIGH (ref 70–99)
Glucose-Capillary: 169 mg/dL — ABNORMAL HIGH (ref 70–99)

## 2023-08-25 LAB — MAGNESIUM: Magnesium: 2.5 mg/dL — ABNORMAL HIGH (ref 1.7–2.4)

## 2023-08-25 NOTE — Telephone Encounter (Signed)
Pt's wife said pt is in the hospital and they told her he is having heart failure but no cardiologist has seen him. She would like a call back

## 2023-08-25 NOTE — Plan of Care (Signed)
  Problem: Fluid Volume: Goal: Ability to maintain a balanced intake and output will improve Outcome: Progressing   Problem: Health Behavior/Discharge Planning: Goal: Ability to identify and utilize available resources and services will improve Outcome: Progressing Goal: Ability to manage health-related needs will improve Outcome: Progressing   Problem: Metabolic: Goal: Ability to maintain appropriate glucose levels will improve Outcome: Progressing   Problem: Nutritional: Goal: Maintenance of adequate nutrition will improve Outcome: Progressing Goal: Progress toward achieving an optimal weight will improve Outcome: Progressing   Problem: Skin Integrity: Goal: Risk for impaired skin integrity will decrease Outcome: Progressing   Problem: Fluid Volume: Goal: Hemodynamic stability will improve Outcome: Progressing   Problem: Clinical Measurements: Goal: Diagnostic test results will improve Outcome: Progressing Goal: Signs and symptoms of infection will decrease Outcome: Progressing   Problem: Respiratory: Goal: Ability to maintain adequate ventilation will improve Outcome: Progressing   Problem: Education: Goal: Knowledge of General Education information will improve Description: Including pain rating scale, medication(s)/side effects and non-pharmacologic comfort measures Outcome: Progressing   Problem: Health Behavior/Discharge Planning: Goal: Ability to manage health-related needs will improve Outcome: Progressing   Problem: Clinical Measurements: Goal: Ability to maintain clinical measurements within normal limits will improve Outcome: Progressing Goal: Will remain free from infection Outcome: Progressing Goal: Diagnostic test results will improve Outcome: Progressing Goal: Respiratory complications will improve Outcome: Progressing Goal: Cardiovascular complication will be avoided Outcome: Progressing   Problem: Activity: Goal: Risk for activity  intolerance will decrease Outcome: Progressing   Problem: Nutrition: Goal: Adequate nutrition will be maintained Outcome: Progressing   Problem: Elimination: Goal: Will not experience complications related to bowel motility Outcome: Progressing Goal: Will not experience complications related to urinary retention Outcome: Progressing   Problem: Pain Management: Goal: General experience of comfort will improve Outcome: Progressing   Problem: Safety: Goal: Ability to remain free from injury will improve Outcome: Progressing   Problem: Skin Integrity: Goal: Risk for impaired skin integrity will decrease Outcome: Progressing

## 2023-08-25 NOTE — Progress Notes (Signed)
Physical Therapy Treatment Patient Details Name: Kurt Aguilar MRN: [id number] DOB: 05/22/1946 Today's Date: 08/25/2023   History of Present Illness Patient is a 77 year old male with Acute hypoxemic respiratory failure, left lower lobe pneumonia, severe sepsis. History of CAD, CABG, HTN, diabetes mellitus, CKD, hyperlipidemia, gout, polycystic kidney disease, GERD, pneumonia, sleep apnea.    PT Comments  Pt in bed on arrival, s/p an obvious period of basal slip. Wife at bedside. Pt reports stiff and uncomfortable from inability to move well today. Pt givens narrative on OOB to chair previous day, very difficult to perform, needed substantial physical assistance. Pt in AF c RVR in 110s prior to arrival, but RN cleared patient for low exertion activity- has already has BB therapies. Pt motivated to do anything really. Once repositioned in bed, worked on active and resisted movements in BLE, then trunk and arm strength. Pt has intermittent back pain with some periods of low bed angle, but otherwise comfortable. Pt is incredibly weak in core, needs assist to pull self up to upright sitting. HR in low 120s range during session, pt also have intermittent moderate dyspnea during session, still on 3L [place], intermittent desaturation appears often to be related to hand blanching during tense grasping. Pt coughing quite a bit in session, minimally productive. RN aware.    If plan is discharge home, recommend the following: A little help with walking and/or transfers;A little help with bathing/dressing/bathroom;Assistance with cooking/housework;Help with stairs or ramp for entrance   Can travel by private vehicle     Yes  Equipment Recommendations  None recommended by PT    Recommendations for Other Services       Precautions / Restrictions Precautions Precautions: Fall Precaution Comments: AF c RVR, chronic low back pain Restrictions Weight Bearing Restrictions Per Provider Order: No      Mobility  Bed Mobility Overal bed mobility: Needs Assistance Bed Mobility: Supine to Sit     Supine to sit: Mod assist, Used rails (reclined to short sitting minA + 2 BUE pull on rails)          Transfers                        Ambulation/Gait                   Stairs             Wheelchair Mobility     Tilt Bed    Modified Rankin (Stroke Patients Only)       Balance                                            Cognition Arousal: Alert Behavior During Therapy: WFL for tasks assessed/performed Overall Cognitive Status: Within Functional Limits for tasks assessed                                          Exercises Other Exercises Other Exercises: heel slide up/manual resisted extension x15 bilat Other Exercises: SAQ 15x1secH Other Exercises: recliner (~40) degrees to upright short sitting x6 (modified situps with 2 hands on bedrails to pull self Other Exercises: ankle DF in short sittign x10 Other Exercises: ankle PF against footboard resistance x12 bilat (short sitting)  General Comments        Pertinent Vitals/Pain Pain Assessment Pain Assessment:  (intermittent low back pain when repositong)    Home Living                          Prior Function            PT Goals (current goals can now be found in the care plan section) Acute Rehab PT Goals Patient Stated Goal: to be home by next Sunday for Christmas party PT Goal Formulation: With patient/family Time For Goal Achievement: 09/06/23 Potential to Achieve Goals: Fair Progress towards PT goals: PT to reassess next treatment    Frequency    Min 1X/week      PT Plan      Co-evaluation              AM-PAC PT "6 Clicks" Mobility   Outcome Measure  Help needed turning from your back to your side while in a flat bed without using bedrails?: A Lot Help needed moving from lying on your back to sitting on the  side of a flat bed without using bedrails?: A Lot Help needed moving to and from a bed to a chair (including a wheelchair)?: A Lot Help needed standing up from a chair using your arms (e.g., wheelchair or bedside chair)?: A Lot Help needed to walk in hospital room?: A Little Help needed climbing 3-5 steps with a railing? : A Lot 6 Click Score: 13    End of Session Equipment Utilized During Treatment: Oxygen Activity Tolerance: Patient limited by fatigue;Treatment limited secondary to medical complications (Comment) (RVR in 120s with bed exercises) Patient left: in bed;with call bell/phone within reach Nurse Communication: Mobility status PT Visit Diagnosis: Unsteadiness on feet (R26.81);Muscle weakness (generalized) (M62.81)     Time: 8413-2440 PT Time Calculation (min) (ACUTE ONLY): 20 min  Charges:    $Therapeutic Activity: 8-22 mins PT General Charges $$ ACUTE PT VISIT: 1 Visit                    1:31 PM, 08/25/23 Rosamaria Lints, PT, DPT Physical Therapist - [facility]  [phone] (ASCOM)    Raymir Frommelt C 08/25/2023, 1:20 PM

## 2023-08-25 NOTE — TOC Progression Note (Signed)
Transition of Care [facility]) - Progression Note    Patient Details  Name: Kurt Aguilar MRN: [id number] Date of Birth: 23-Sep-1945  Transition of Care [facility]) CM/SW Contact  Margarito Liner, LCSW Phone Number: 08/25/2023, 12:16 PM  Clinical Narrative: Per MD, patient will likely be ready for discharge tomorrow. [facility] admissions coordinator is aware. CSW left voicemail for [employer] to start insurance authorization.    Expected Discharge Plan and Services                                               Social Determinants of Health (SDOH) Interventions SDOH Screenings   Food Insecurity: No Food Insecurity (08/21/2023)  Housing: Low Risk  (08/21/2023)  Transportation Needs: No Transportation Needs (08/21/2023)  Utilities: Not At Risk (08/21/2023)  Alcohol Screen: Low Risk  (04/30/2023)  Depression (PHQ2-9): Low Risk  (04/30/2023)  Financial Resource Strain: Low Risk  (04/30/2023)  Physical Activity: Insufficiently Active (04/30/2023)  Social Connections: Socially Integrated (04/30/2023)  Stress: No Stress Concern Present (04/30/2023)  Tobacco Use: Medium Risk (08/20/2023)  Health Literacy: Adequate Health Literacy (04/30/2023)    Readmission Risk Interventions    09/20/2021    9:36 AM  Readmission Risk Prevention Plan  Transportation Screening Complete  Medication Review (RN Care Manager) Referral to Pharmacy  PCP or Specialist appointment within 3-5 days of discharge Not Complete  PCP/Specialist Appt Not Complete comments pending patient is not medically stable but does have a PCP  HRI or Home Care Consult Complete  SW Recovery Care/Counseling Consult Complete  Palliative Care Screening Not Applicable  Skilled Nursing Facility Not Applicable

## 2023-08-25 NOTE — Telephone Encounter (Signed)
Returned the call to the wife. She stated that the patient was admitted last week with sepsis and pneumonia. She would like for Dr. Mariah Milling to be made aware.

## 2023-08-25 NOTE — TOC Progression Note (Signed)
Transition of Care [facility]) - Progression Note    Patient Details  Name: Kurt Aguilar MRN: [id number] Date of Birth: 1946/02/08  Transition of Care [facility]) CM/SW Contact  Liliana Cline, LCSW Phone Number: 08/25/2023, 3:00 PM  Clinical Narrative:    Received call from [place] with [employer]. Provided information needed for her to start auth for SNF.        Expected Discharge Plan and Services                                               Social Determinants of Health (SDOH) Interventions SDOH Screenings   Food Insecurity: No Food Insecurity (08/21/2023)  Housing: Low Risk  (08/21/2023)  Transportation Needs: No Transportation Needs (08/21/2023)  Utilities: Not At Risk (08/21/2023)  Alcohol Screen: Low Risk  (04/30/2023)  Depression (PHQ2-9): Low Risk  (04/30/2023)  Financial Resource Strain: Low Risk  (04/30/2023)  Physical Activity: Insufficiently Active (04/30/2023)  Social Connections: Socially Integrated (04/30/2023)  Stress: No Stress Concern Present (04/30/2023)  Tobacco Use: Medium Risk (08/20/2023)  Health Literacy: Adequate Health Literacy (04/30/2023)    Readmission Risk Interventions    09/20/2021    9:36 AM  Readmission Risk Prevention Plan  Transportation Screening Complete  Medication Review (RN Care Manager) Referral to Pharmacy  PCP or Specialist appointment within 3-5 days of discharge Not Complete  PCP/Specialist Appt Not Complete comments pending patient is not medically stable but does have a PCP  HRI or Home Care Consult Complete  SW Recovery Care/Counseling Consult Complete  Palliative Care Screening Not Applicable  Skilled Nursing Facility Not Applicable

## 2023-08-25 NOTE — Progress Notes (Addendum)
Progress Note   Patient: Kurt Aguilar IRS:[id number] DOB: [birth year] DOA: 08/20/2023     5 DOS: the patient was seen and examined on 08/25/2023   Brief hospital course: Mr. Handsome Grissett is a 77 year old male with history of CAD status post CABG in 1997, hypertension, insulin-dependent diabetes mellitus, CKD stage IV, hyperlipidemia, gout, reactive airway symptoms, cryptogenic organizing pneumonia, on chronic daily steroid, polycystic kidney disease, GERD, who presents to the emergency department for chief concerns of shortness of breath.  Patient was hypoxic requiring BiPAP, procalcitonin elevated, admitted to the hospitalist service for further management evaluation of severe sepsis secondary to pneumonia  Assessment and Plan: * Acute hypoxemic respiratory failure (HCC) Left lower lobe Pneumonia Severe sepsis [facility]) Patient meets sepsis criteria, likely severe sepsis Still has tachypnea, BP lower side but improved. Blood cultures x 2 pending. MRSA negative, vancomycin discontinued Procalcitonin elevated. He is weaned off bipap to 40% now on 4L supplemental oxygen.   --received 5 days of meropenem and azithromycin.  Sputum with GPC, no Staph aureus or Pseudomonas seen. --cont ceftriaxone for 2 more days --Continue supplemental O2 to keep sats >=90%, wean as tolerated  Pulmonary infiltrates with high ESR  Tracheomalacia - IS, Flutter valvue, chest PT ordered. Continue Robitussin, DuoNebs. Tessalon antitussives. CT chest done showed findings of tracheomalacia. Pulmonologist advised  -pulm clearance regimen (3% NS nebs, followed by albuterol nebs followed by Flutter followed by IS twice daily). Purse lip breathing, pulmonary rehab. --cont prednisone 20 mg daily, need very slow taper  Acute on chronic kidney disease stage IIIb: --monitor  CAD (coronary artery disease) Status post CABG in 1997 Continue rosuvastatin 40 mg nightly, aspirin 81 mg daily.  Essential  hypertension Caution with antihypertensives. Blood pressure border line low. --cont home lasix  Hyperlipidemia associated with type 2 diabetes mellitus (HCC) Continue rosuvastatin 40 mg daily, ezetimibe 10 mg daily.  Type 2 diabetes mellitus: NovoLog 70/30 30 units with breakfast. --ACHS and SSI  Hypokalemia --monitor and supplement PRN  OSA on CPAP.    Obesity with BMI 36.38. Diet, exercise and weight reduction advised.  Chronic Diastolic Heart Failure   Out of bed to chair. Incentive spirometry. Nursing supportive care. Fall, aspiration precautions. DVT prophylaxis: heparin injection   Code Status: Limited: Do not attempt resuscitation (DNR) -DNR-LIMITED -Do Not Intubate/DNI   Subjective:  Pt reported breathing worse in the early morning.   Physical Exam: Vitals:   08/24/23 2338 08/25/23 0420 08/25/23 0733 08/25/23 0908  BP: 132/78 (!) 138/91  137/79  Pulse:    (!) 111  Resp: 19 19  18   Temp: 98.7 F (37.1 C) 98.1 F (36.7 C)  98.2 F (36.8 C)  TempSrc: Oral Oral    SpO2: 95%  95% (!) 89%  Weight:      Height:        Constitutional: NAD, AAOx3 HEENT: conjunctivae and lids normal, EOMI CV: No cyanosis.   RESP: normal respiratory effort Neuro: II - XII grossly intact.   Psych: Normal mood and affect.  Appropriate judgement and reason   Data Reviewed:      Latest Ref Rng & Units 08/25/2023    4:42 AM 08/23/2023    2:48 AM 08/22/2023    2:56 AM  CBC  WBC 4.0 - 10.5 K/uL 12.1  7.0  8.2   Hemoglobin 13.0 - 17.0 g/dL 00.9  9.2  38.1   Hematocrit 39.0 - 52.0 % 34.3  28.3  32.5   Platelets 150 - 400 K/uL 218  149  137       Latest Ref Rng & Units 08/25/2023    1:46 PM 08/25/2023    4:42 AM 08/23/2023    2:48 AM  BMP  Glucose 70 - 99 mg/dL  130  865   BUN 8 - 23 mg/dL  74  68   Creatinine 7.84 - 1.24 mg/dL  6.96  2.95   Sodium 284 - 145 mmol/L  141  141   Potassium 3.5 - 5.1 mmol/L 4.3  5.4  4.1   Chloride 98 - 111 mmol/L  106  107   CO2 22 -  32 mmol/L  23  26   Calcium 8.9 - 10.3 mg/dL  8.7  8.1    No results found.  Family Communication:  Wife updated at bedside today.  Disposition: Status is: Inpatient Remains inpatient appropriate because: Sepsis with pneumonia, IV antibiotics, supplemental oxygen  Planned Discharge Destination: Rehab     MDM level 2:   Author: Darlin Priestly, MD 08/25/2023 7:53 PM Secure chat 7am to 7pm For on call review [email or web address].

## 2023-08-26 DIAGNOSIS — J9601 Acute respiratory failure with hypoxia: Secondary | ICD-10-CM | POA: Diagnosis not present

## 2023-08-26 LAB — CBC
HCT: 33.7 % — ABNORMAL LOW (ref 39.0–52.0)
Hemoglobin: 11 g/dL — ABNORMAL LOW (ref 13.0–17.0)
MCH: 32.6 pg (ref 26.0–34.0)
MCHC: 32.6 g/dL (ref 30.0–36.0)
MCV: 100 fL (ref 80.0–100.0)
Platelets: 271 10*3/uL (ref 150–400)
RBC: 3.37 MIL/uL — ABNORMAL LOW (ref 4.22–5.81)
RDW: 15.4 % (ref 11.5–15.5)
WBC: 12.3 10*3/uL — ABNORMAL HIGH (ref 4.0–10.5)
nRBC: 0.2 % (ref 0.0–0.2)

## 2023-08-26 LAB — ASPERGILLUS ANTIGEN, BAL/SERUM: Aspergillus Ag, BAL/Serum: 0.05 {index} (ref 0.00–0.49)

## 2023-08-26 LAB — BASIC METABOLIC PANEL
Anion gap: 14 (ref 5–15)
BUN: 79 mg/dL — ABNORMAL HIGH (ref 8–23)
CO2: 24 mmol/L (ref 22–32)
Calcium: 8.7 mg/dL — ABNORMAL LOW (ref 8.9–10.3)
Chloride: 104 mmol/L (ref 98–111)
Creatinine, Ser: 3.52 mg/dL — ABNORMAL HIGH (ref 0.61–1.24)
GFR, Estimated: 17 mL/min — ABNORMAL LOW (ref >60)
Glucose, Bld: 115 mg/dL — ABNORMAL HIGH (ref 70–99)
Potassium: 4.6 mmol/L (ref 3.5–5.1)
Sodium: 142 mmol/L (ref 135–145)

## 2023-08-26 LAB — FUNGITELL BETA-D-GLUCAN
Fungitell Value:: 39.054 pg/mL
Result Name:: NEGATIVE

## 2023-08-26 LAB — GLUCOSE, CAPILLARY
Glucose-Capillary: 110 mg/dL — ABNORMAL HIGH (ref 70–99)
Glucose-Capillary: 170 mg/dL — ABNORMAL HIGH (ref 70–99)
Glucose-Capillary: 156 mg/dL — ABNORMAL HIGH (ref 70–99)
Glucose-Capillary: 168 mg/dL — ABNORMAL HIGH (ref 70–99)

## 2023-08-26 LAB — MAGNESIUM: Magnesium: 2.5 mg/dL — ABNORMAL HIGH (ref 1.7–2.4)

## 2023-08-26 NOTE — Plan of Care (Signed)
  Problem: Fluid Volume: Goal: Ability to maintain a balanced intake and output will improve Outcome: Progressing   Problem: Health Behavior/Discharge Planning: Goal: Ability to identify and utilize available resources and services will improve Outcome: Progressing Goal: Ability to manage health-related needs will improve Outcome: Progressing   Problem: Metabolic: Goal: Ability to maintain appropriate glucose levels will improve Outcome: Progressing   Problem: Nutritional: Goal: Maintenance of adequate nutrition will improve Outcome: Progressing Goal: Progress toward achieving an optimal weight will improve Outcome: Progressing   Problem: Skin Integrity: Goal: Risk for impaired skin integrity will decrease Outcome: Progressing   Problem: Fluid Volume: Goal: Hemodynamic stability will improve Outcome: Progressing   Problem: Clinical Measurements: Goal: Diagnostic test results will improve Outcome: Progressing Goal: Signs and symptoms of infection will decrease Outcome: Progressing   Problem: Respiratory: Goal: Ability to maintain adequate ventilation will improve Outcome: Progressing   Problem: Education: Goal: Knowledge of General Education information will improve Description: Including pain rating scale, medication(s)/side effects and non-pharmacologic comfort measures Outcome: Progressing   Problem: Health Behavior/Discharge Planning: Goal: Ability to manage health-related needs will improve Outcome: Progressing   Problem: Clinical Measurements: Goal: Ability to maintain clinical measurements within normal limits will improve Outcome: Progressing Goal: Will remain free from infection Outcome: Progressing Goal: Diagnostic test results will improve Outcome: Progressing Goal: Respiratory complications will improve Outcome: Progressing Goal: Cardiovascular complication will be avoided Outcome: Progressing   Problem: Activity: Goal: Risk for activity  intolerance will decrease Outcome: Progressing   Problem: Nutrition: Goal: Adequate nutrition will be maintained Outcome: Progressing   Problem: Elimination: Goal: Will not experience complications related to bowel motility Outcome: Progressing Goal: Will not experience complications related to urinary retention Outcome: Progressing   Problem: Pain Management: Goal: General experience of comfort will improve Outcome: Progressing   Problem: Safety: Goal: Ability to remain free from injury will improve Outcome: Progressing   Problem: Skin Integrity: Goal: Risk for impaired skin integrity will decrease Outcome: Progressing

## 2023-08-26 NOTE — TOC Progression Note (Signed)
Transition of Care [facility]) - Progression Note    Patient Details  Name: Kurt Aguilar MRN: [id number] Date of Birth: [birth year]  Transition of Care [facility]) CM/SW Contact  Margarito Liner, LCSW Phone Number: 08/26/2023, 2:49 PM  Clinical Narrative:   CSW called [employer]. Insurance authorization is still pending.  Expected Discharge Plan and Services                                               Social Determinants of Health (SDOH) Interventions SDOH Screenings   Food Insecurity: No Food Insecurity (08/21/2023)  Housing: Low Risk  (08/21/2023)  Transportation Needs: No Transportation Needs (08/21/2023)  Utilities: Not At Risk (08/21/2023)  Alcohol Screen: Low Risk  (04/30/2023)  Depression (PHQ2-9): Low Risk  (04/30/2023)  Financial Resource Strain: Low Risk  (04/30/2023)  Physical Activity: Insufficiently Active (04/30/2023)  Social Connections: Socially Integrated (04/30/2023)  Stress: No Stress Concern Present (04/30/2023)  Tobacco Use: Medium Risk (08/20/2023)  Health Literacy: Adequate Health Literacy (04/30/2023)    Readmission Risk Interventions    09/20/2021    9:36 AM  Readmission Risk Prevention Plan  Transportation Screening Complete  Medication Review (RN Care Manager) Referral to Pharmacy  PCP or Specialist appointment within 3-5 days of discharge Not Complete  PCP/Specialist Appt Not Complete comments pending patient is not medically stable but does have a PCP  HRI or Home Care Consult Complete  SW Recovery Care/Counseling Consult Complete  Palliative Care Screening Not Applicable  Skilled Nursing Facility Not Applicable

## 2023-08-26 NOTE — Progress Notes (Signed)
[facility]  ROUNDING NOTE   Subjective:   Kurt Aguilar is a 77 year old male with past medical conditions including hypertension, diabetes, hyperlipidemia, gout, CAD status post CABG in 1997, GERD, polycystic kidney disease, and chronic kidney disease stage IV.  Patient presented to the emergency department with shortness of breath and was admitted at that time for Acute respiratory failure with hypoxia (HCC) [J96.01] Acute hypoxemic respiratory failure (HCC) [J96.01] Community acquired pneumonia of left lower lobe of lung [J18.9]  Patient is known to our practice and is followed outpatient by Dr. Thedore Mins.  Patient being treated for pneumonia.  We have been consulted due to worsening renal function.Continues to have adequate urine output. Denies nausea or vomiting.   Patient has been prescribed oral furosemide 20 mg daily, per home regimen.  Patient also received CT chest high-resolution.  We have been consulted to evaluate acute kidney injury.   Objective:  Vital signs in last 24 hours:  Temp:  [97.6 F (36.4 C)-98.3 F (36.8 C)] 97.6 F (36.4 C) (12/18 0757) Pulse Rate:  [86-102] 88 (12/18 0757) Resp:  [18-20] 18 (12/18 0400) BP: (130-139)/(83-114) 131/114 (12/18 0757) SpO2:  [95 %-99 %] 96 % (12/18 0757)  Weight change:  Filed Weights   08/20/23 1101 08/20/23 2246 08/23/23 2056  Weight: 111.6 kg 115 kg 117.3 kg    Intake/Output: I/O last 3 completed shifts: In: 1230.1 [P.O.:1080; IV Piggyback:150.1] Out: 3225 [Urine:3225]   Intake/Output this shift:  No intake/output data recorded.  Physical Exam: General: NAD  Head: Normocephalic, atraumatic. Moist oral mucosal membranes  Eyes: Anicteric  Lungs:  Clear to auscultation, normal effort  Heart: Regular rate and rhythm  Abdomen:  Soft, nontender  Extremities:  2+ peripheral edema.  Neurologic: Nonfocal, moving all four extremities  Skin: No lesions       Basic Metabolic Panel: Recent Labs  Lab  08/20/23 1109 08/21/23 0327 08/22/23 0256 08/23/23 0248 08/25/23 0442 08/25/23 1346 08/26/23 0711  NA 139 137 136 141 141  --  142  K 3.2* 3.6 3.3* 4.1 5.4* 4.3 4.6  CL 102 104 102 107 106  --  104  CO2 25 23 22 26 23   --  24  GLUCOSE 153* 192* 129* 153* 103*  --  115*  BUN 57* 62* 66* 68* 74*  --  79*  CREATININE 3.07* 2.91* 2.97* 2.96* 3.04*  --  3.52*  CALCIUM 8.6* 7.8* 7.8* 8.1* 8.7*  --  8.7*  MG 1.9  --   --   --  2.5*  --  2.5*    Liver Function Tests: Recent Labs  Lab 08/20/23 1109  AST 28  ALT 32  ALKPHOS 40  BILITOT 2.2*  PROT 6.0*  ALBUMIN 3.4*   No results for input(s): "LIPASE", "AMYLASE" in the last 168 hours. No results for input(s): "AMMONIA" in the last 168 hours.  CBC: Recent Labs  Lab 08/20/23 1109 08/21/23 0327 08/22/23 0256 08/23/23 0248 08/25/23 0442 08/26/23 0825  WBC 17.0* 10.0 8.2 7.0 12.1* 12.3*  NEUTROABS 11.2*  --   --   --   --   --   HGB 12.6* 10.1* 10.8* 9.2* 11.4* 11.0*  HCT 38.7* 31.0* 32.5* 28.3* 34.3* 33.7*  MCV 102.1* 99.0 100.6* 99.3 97.7 100.0  PLT 164 129* 137* 149* 218 271    Cardiac Enzymes: No results for input(s): "CKTOTAL", "CKMB", "CKMBINDEX", "TROPONINI" in the last 168 hours.  BNP: Invalid input(s): "POCBNP"  CBG: Recent Labs  Lab 08/25/23 1137 08/25/23  1659 08/25/23 2049 08/26/23 0759 08/26/23 1157  GLUCAP 193* 133* 169* 110* 170*    Microbiology: Results for orders placed or performed during the hospital encounter of 08/20/23  Blood Culture (routine x 2)     Status: None   Collection Time: 08/20/23 10:52 AM   Specimen: BLOOD  Result Value Ref Range Status   Specimen Description BLOOD BLOOD LEFT WRIST  Final   Special Requests   Final    BOTTLES DRAWN AEROBIC AND ANAEROBIC Blood Culture adequate volume   Culture   Final    NO GROWTH 5 DAYS Performed at [facility], [address] [place], [place] [address]    Report Status 08/25/2023 FINAL  Final  Blood Culture (routine x 2)      Status: None   Collection Time: 08/20/23 10:57 AM   Specimen: BLOOD  Result Value Ref Range Status   Specimen Description BLOOD BLOOD RIGHT FOREARM  Final   Special Requests   Final    BOTTLES DRAWN AEROBIC AND ANAEROBIC Blood Culture adequate volume   Culture   Final    NO GROWTH 5 DAYS Performed at [facility], [address] [place], [place] [address]    Report Status 08/25/2023 FINAL  Final  Resp panel by RT-PCR (RSV, Flu A&B, Covid) Anterior Nasal Swab     Status: None   Collection Time: 08/20/23 11:09 AM   Specimen: Anterior Nasal Swab  Result Value Ref Range Status   SARS Coronavirus 2 by RT PCR NEGATIVE NEGATIVE Final    Comment: (NOTE) SARS-CoV-2 target nucleic acids are NOT DETECTED.  The SARS-CoV-2 RNA is generally detectable in upper respiratory specimens during the acute phase of infection. The lowest concentration of SARS-CoV-2 viral copies this assay can detect is 138 copies/mL. A negative result does not preclude SARS-Cov-2 infection and should not be used as the sole basis for treatment or other patient management decisions. A negative result may occur with  improper specimen collection/handling, submission of specimen other than nasopharyngeal swab, presence of viral mutation(s) within the areas targeted by this assay, and inadequate number of viral copies(<138 copies/mL). A negative result must be combined with clinical observations, patient history, and epidemiological information. The expected result is Negative.  Fact Sheet for Patients:  BloggerCourse.com  Fact Sheet for Healthcare Providers:  SeriousBroker.it  This test is no t yet approved or cleared by the [place] FDA and  has been authorized for detection and/or diagnosis of SARS-CoV-2 by FDA under an Emergency Use Authorization (EUA). This EUA will remain  in effect (meaning this test can be used) for the duration of the COVID-19  declaration under Section 564(b)(1) of the Act, 21 U.S.C.section 360bbb-3(b)(1), unless the authorization is terminated  or revoked sooner.       Influenza A by PCR NEGATIVE NEGATIVE Final   Influenza B by PCR NEGATIVE NEGATIVE Final    Comment: (NOTE) The Xpert Xpress SARS-CoV-2/FLU/RSV plus assay is intended as an aid in the diagnosis of influenza from Nasopharyngeal swab specimens and should not be used as a sole basis for treatment. Nasal washings and aspirates are unacceptable for Xpert Xpress SARS-CoV-2/FLU/RSV testing.  Fact Sheet for Patients: BloggerCourse.com  Fact Sheet for Healthcare Providers: SeriousBroker.it  This test is not yet approved or cleared by the [place] FDA and has been authorized for detection and/or diagnosis of SARS-CoV-2 by FDA under an Emergency Use Authorization (EUA). This EUA will remain in effect (meaning this test can be used) for the  duration of the COVID-19 declaration under Section 564(b)(1) of the Act, 21 U.S.C. section 360bbb-3(b)(1), unless the authorization is terminated or revoked.     Resp Syncytial Virus by PCR NEGATIVE NEGATIVE Final    Comment: (NOTE) Fact Sheet for Patients: BloggerCourse.com  Fact Sheet for Healthcare Providers: SeriousBroker.it  This test is not yet approved or cleared by the [place] FDA and has been authorized for detection and/or diagnosis of SARS-CoV-2 by FDA under an Emergency Use Authorization (EUA). This EUA will remain in effect (meaning this test can be used) for the duration of the COVID-19 declaration under Section 564(b)(1) of the Act, 21 U.S.C. section 360bbb-3(b)(1), unless the authorization is terminated or revoked.  Performed at [facility], [address] [place], [place] [address]   MRSA Next Gen by PCR, Nasal     Status: None   Collection Time: 08/20/23  3:31 PM    Specimen: Nasal Mucosa; Nasal Swab  Result Value Ref Range Status   MRSA by PCR Next Gen NOT DETECTED NOT DETECTED Final    Comment: (NOTE) The GeneXpert MRSA Assay (FDA approved for NASAL specimens only), is one component of a comprehensive MRSA colonization surveillance program. It is not intended to diagnose MRSA infection nor to guide or monitor treatment for MRSA infections. Test performance is not FDA approved in patients less than 79 years old. Performed at [facility], [address] [place], [place] [address]   Expectorated Sputum Assessment w Gram Stain, Rflx to Resp Cult     Status: None   Collection Time: 08/21/23 11:14 AM   Specimen: Sputum  Result Value Ref Range Status   Specimen Description SPUTUM  Final   Special Requests NONE  Final   Sputum evaluation   Final    THIS SPECIMEN IS ACCEPTABLE FOR SPUTUM CULTURE Performed at [facility], [address] [place], [place] [address]    Report Status 08/21/2023 FINAL  Final  Culture, Respiratory w Gram Stain     Status: None   Collection Time: 08/21/23 11:14 AM   Specimen: SPU  Result Value Ref Range Status   Specimen Description   Final    SPUTUM Performed at [facility], [address], [place], [place] [address]    Special Requests   Final    NONE Reflexed from [phone] Performed at [facility], [address] [place], [place] [address]    Gram Stain   Final    ABUNDANT WBC PRESENT, PREDOMINANTLY PMN FEW GRAM POSITIVE COCCI    Culture   Final    MODERATE Normal respiratory flora-no Staph aureus or Pseudomonas seen Performed at [facility], [address], [place], [place] [address]    Report Status 08/24/2023 FINAL  Final  Aspergillus Ag, BAL/Serum     Status: None   Collection Time: 08/22/23  6:27 PM   Specimen: Vein  Result Value Ref Range Status   Aspergillus Ag, BAL/Serum 0.05 0.00 - 0.49 Index Final    Comment: (NOTE) Performed At: [facility] [address] [place], [place] [id number] Jolene Schimke MD KG:[id number]     Coagulation Studies: No results for input(s): "LABPROT", "INR" in the last 72 hours.  Urinalysis: No results for input(s): "COLORURINE", "LABSPEC", "PHURINE", "GLUCOSEU", "HGBUR", "BILIRUBINUR", "KETONESUR", "PROTEINUR", "UROBILINOGEN", "NITRITE", "LEUKOCYTESUR" in the last 72 hours.  Invalid input(s): "APPERANCEUR"    Imaging: No results found.   Medications:    cefTRIAXone (ROCEPHIN)  IV Stopped (08/25/23 2231)  albuterol  2.5 mg Nebulization BID   allopurinol  100 mg Oral BID   aspirin EC  81 mg Oral Daily   benzonatate  200 mg Oral TID   cyanocobalamin  500 mcg Oral Daily   ezetimibe  10 mg Oral Daily   famotidine  20 mg Oral QHS   gabapentin  300 mg Oral QHS   heparin  5,000 Units Subcutaneous Q8H   insulin aspart  0-5 Units Subcutaneous QHS   insulin aspart  0-9 Units Subcutaneous TID WC   insulin aspart protamine- aspart  30 Units Subcutaneous Q breakfast   predniSONE  20 mg Oral Q breakfast   rosuvastatin  40 mg Oral Daily   senna-docusate  1 tablet Oral QHS   diphenhydrAMINE, meclizine, metoprolol tartrate, nitroGLYCERIN, ondansetron **OR** ondansetron (ZOFRAN) IV, mouth rinse  Assessment/ Plan:  [name] is a 77 y.o.  male with past medical conditions including hypertension, diabetes, hyperlipidemia, gout, CAD status post CABG in 1997, GERD, polycystic kidney disease, and chronic kidney disease stage IV.  Patient presented to the emergency department with shortness of breath and was admitted at that time for Acute respiratory failure with hypoxia (HCC) [J96.01] Acute hypoxemic respiratory failure (HCC) [J96.01] Community acquired pneumonia of left lower lobe of lung [J18.9]   Acute Kidney Injury on chronic kidney disease stage IV with baseline creatinine 2.23 on 04/15/23.  Acute kidney injury secondary to IV contrast exposure.  Chronic kidney disease is  multifactorial: Diabetes and polycystic kidney disease, hypertension, obesity Oral diuretics held due to decreased renal function. No acute indication for dialysis. Will continue to monitor.    Lab Results  Component Value Date   CREATININE 3.52 (H) 08/26/2023   CREATININE 3.04 (H) 08/25/2023   CREATININE 2.96 (H) 08/23/2023    Intake/Output Summary (Last 24 hours) at 08/26/2023 1319 Last data filed at 08/26/2023 0200 Gross per 24 hour  Intake 650 ml  Output 1900 ml  Net -1250 ml    2. Anemia of chronic kidney disease Lab Results  Component Value Date   HGB 11.0 (L) 08/26/2023    Hgb within desired range. Will assess need for ESA during this admission.  3. Secondary Hyperparathyroidism: with outpatient labs: PTH 73, phosphorus 4.2, calcium 9.7 on 08/18/23.   Lab Results  Component Value Date   CALCIUM 8.7 (L) 08/26/2023   CAION 1.15 09/17/2021   PHOS 3.4 09/12/2016    Patient currently prescribed calcium carbonate with vitamin D outpatient.  Will continue to monitor bone minerals during this admission.  4. Diabetes mellitus type II with chronic kidney disease/renal manifestations: insulin dependent. Home regimen includes semaglutide, Novolin 70/30 and lispro. Most recent hemoglobin A1c is 6.3 on 05/08/23.      LOS: 6 Tiler Brandis 12/18/20241:19 PM

## 2023-08-26 NOTE — Care Management Important Message (Signed)
Important Message  Patient Details  Name: Kurt Aguilar MRN: [id number] Date of Birth: [birth year]   Important Message Given:  Yes - Medicare IM     Bernadette Hoit 08/26/2023, 3:05 PM

## 2023-08-26 NOTE — Evaluation (Signed)
Occupational Therapy Evaluation Patient Details Name: Kurt Aguilar MRN: [id number] DOB: 1946-03-03 Today's Date: 08/26/2023   History of Present Illness Patient is a 77 year old male with Acute hypoxemic respiratory failure, left lower lobe pneumonia, severe sepsis. History of CAD, CABG, HTN, diabetes mellitus, CKD, hyperlipidemia, gout, polycystic kidney disease, GERD, pneumonia, sleep apnea.   Clinical Impression   Pt was seen for OT evaluation this date. Prior to hospital admission, pt lives with his wife in an ILF apartment where he was MOD I with ADLs and mobility using a SPC inside and rollator for community distances.  Pt presents to acute OT demonstrating impaired ADL performance and functional mobility 2/2 weakness, limited endurance, balance deficits (See OT problem list for additional functional deficits). Pt currently requires SUP for bed mobility, Min A for STS from elevated height at EOB and CGA for SPT to recliner. Pt fatigues easily requiring increased time and recovery between tasks/activities d/t HR up to 128 to 118 with change in positions. Sp02 drop to 83% on 4-5L with improvement in 1-2 mins with PLB and rest. He stood from recliner with CGA and performed standing marches in place x10 reps before fatiguing and requiring to return to seated position. Pt has R arm skin tear that continues to bleed during hospitalization.  Pt would benefit from skilled OT services to address noted impairments and functional limitations (see below for any additional details) in order to maximize safety and independence while minimizing falls risk and caregiver burden. Do anticipate the need for follow up OT services upon acute hospital DC.        If plan is discharge home, recommend the following: A little help with walking and/or transfers;A lot of help with bathing/dressing/bathroom;Assistance with cooking/housework;Assist for transportation;Help with stairs or ramp for entrance     Functional Status Assessment  Patient has had a recent decline in their functional status and demonstrates the ability to make significant improvements in function in a reasonable and predictable amount of time.  Equipment Recommendations  Other (comment) (defer)    Recommendations for Other Services       Precautions / Restrictions Precautions Precaution Comments: AF c RVR, chronic low back pain Restrictions Weight Bearing Restrictions Per Provider Order: No      Mobility Bed Mobility Overal bed mobility: Needs Assistance Bed Mobility: Supine to Sit     Supine to sit: Supervision, HOB elevated, Used rails          Transfers Overall transfer level: Needs assistance Equipment used: Rolling walker (2 wheels) Transfers: Sit to/from Stand, Bed to chair/wheelchair/BSC Sit to Stand: Min assist, From elevated surface     Step pivot transfers: Contact guard assist     General transfer comment: Min A from elevated bed for STS to RW and step pivot transfer using RW with CGA      Balance Overall balance assessment: Needs assistance Sitting-balance support: Feet supported Sitting balance-Leahy Scale: Fair Sitting balance - Comments: no LOB while seated EOB   Standing balance support: Bilateral upper extremity supported, Reliant on assistive device for balance Standing balance-Leahy Scale: Fair Standing balance comment: rolling walker required for external support with CGA for safety                           ADL either performed or assessed with clinical judgement   ADL Overall ADL's : Needs assistance/impaired   Eating/Feeding Details (indicate cue type and reason): able to suction himself IND  Grooming: Bed level;Wash/dry hands;Set up                   Toilet Transfer: Contact guard assist;Rolling walker (2 wheels) Toilet Transfer Details (indicate cue type and reason): simulated to recliner                 Vision         Perception          Praxis         Pertinent Vitals/Pain Pain Assessment Pain Assessment: Faces Faces Pain Scale: No hurt Pain Intervention(s): Monitored during session     Extremity/Trunk Assessment Upper Extremity Assessment Upper Extremity Assessment: Generalized weakness   Lower Extremity Assessment Lower Extremity Assessment: Generalized weakness       Communication Communication Communication: No apparent difficulties   Cognition Arousal: Alert Behavior During Therapy: WFL for tasks assessed/performed Overall Cognitive Status: Within Functional Limits for tasks assessed                                       General Comments  vitals monitored throughout with HR up to 128 with moving to EOB, 117 with SPT; sp02 dropped to 83% on 4-5L with improved to 90+ with ~1-2 mins of rest and PLB    Exercises Other Exercises Other Exercises: Edu on role of OT in acute setting and importance of therapy to maximize strength/activity tolerance to return to PLOF. Other Exercises: Edu on PLB, pacing, taking rest breaks and ECS to prevent over exertion.   Shoulder Instructions      Home Living Family/patient expects to be discharged to:: Private residence Living Arrangements: Spouse/significant other Available Help at Discharge: Family Type of Home: Independent living facility [facility]) Home Access: Level entry     Home Layout: One level         Bathroom Toilet: Handicapped height     Home Equipment: Rollator (4 wheels);Cane - single point;Shower seat          Prior Functioning/Environment Prior Level of Function : Independent/Modified Independent             Mobility Comments: cane for household ambulation, 4 wheeled walker for community ADLs Comments: Mod I        OT Problem List: Decreased strength;Decreased activity tolerance;Impaired balance (sitting and/or standing)      OT Treatment/Interventions: Self-care/ADL training;Therapeutic  exercise;Therapeutic activities;Energy conservation;Patient/family education;DME and/or AE instruction;Balance training    OT Goals(Current goals can be found in the care plan section) Acute Rehab OT Goals Patient Stated Goal: improve strength and endurance OT Goal Formulation: With patient/family Time For Goal Achievement: 09/09/23 Potential to Achieve Goals: Good ADL Goals Pt Will Perform Lower Body Bathing: with min assist;sit to/from stand;sitting/lateral leans;with adaptive equipment Pt Will Perform Lower Body Dressing: with min assist;sitting/lateral leans;sit to/from stand;with adaptive equipment Pt Will Transfer to Toilet: with supervision;bedside commode;ambulating Pt Will Perform Toileting - Clothing Manipulation and hygiene: with contact guard assist;sit to/from stand;sitting/lateral leans Additional ADL Goal #1: Pt will demo/verbalize 1 learned ECS, e.g. PLB, use of AE/AD, pacing, etc. during ADL performance to maximize his safety and IND and prevent overexertion.  OT Frequency: Min 1X/week    Co-evaluation              AM-PAC OT "6 Clicks" Daily Activity     Outcome Measure Help from another person eating meals?: None Help from another person taking  care of personal grooming?: None Help from another person toileting, which includes using toliet, bedpan, or urinal?: A Little Help from another person bathing (including washing, rinsing, drying)?: A Little Help from another person to put on and taking off regular upper body clothing?: A Little Help from another person to put on and taking off regular lower body clothing?: A Lot 6 Click Score: 19   End of Session Equipment Utilized During Treatment: Rolling walker (2 wheels);Oxygen Nurse Communication: Mobility status  Activity Tolerance: Patient tolerated treatment well Patient left: in chair;with call bell/phone within reach;with chair alarm set;with family/visitor present  OT Visit Diagnosis: Other abnormalities of  gait and mobility (R26.89);Muscle weakness (generalized) (M62.81)                Time: 8469-6295 OT Time Calculation (min): 39 min Charges:  OT General Charges $OT Visit: 1 Visit OT Evaluation $OT Eval Moderate Complexity: 1 Mod OT Treatments $Therapeutic Activity: 23-37 mins Minka Knight, OTR/L 08/26/23, 12:09 PM  Lenia Housley E Evalena Fujii 08/26/2023, 12:06 PM

## 2023-08-26 NOTE — Progress Notes (Signed)
Physical Therapy Treatment Patient Details Name: Kurt Aguilar MRN: [id number] DOB: August 08, 1946 Today's Date: 08/26/2023   History of Present Illness Patient is a 77 year old male with Acute hypoxemic respiratory failure, left lower lobe pneumonia, severe sepsis. History of CAD, CABG, HTN, diabetes mellitus, CKD, hyperlipidemia, gout, polycystic kidney disease, GERD, pneumonia, sleep apnea.    PT Comments  Pt in recliner on entry, reports to feel better today, but his breathing remains labored. Pt on 5L on arrival, bumped to 6L due to sats 85-88% in session. Pt easily fatigued and coughing frequently. We elected to not pursue higher exertion activity. Took time during breaks to clean up some blood from dressing and change out tele battery. MD made aware of elevated HR issues earlier during transfer with OT. WIll continue to follow.     If plan is discharge home, recommend the following: A little help with walking and/or transfers;A little help with bathing/dressing/bathroom;Assistance with cooking/housework;Help with stairs or ramp for entrance   Can travel by private vehicle     Yes  Equipment Recommendations  None recommended by PT    Recommendations for Other Services       Precautions / Restrictions Precautions Precautions: Fall Precaution Comments: AF c RVR, chronic low back pain Restrictions Weight Bearing Restrictions Per Provider Order: No     Mobility  Bed Mobility               General bed mobility comments: in chair on arrival    Transfers                   General transfer comment: deferred: pt was not able to keep sats in range adn largely oppupied by coughing    Ambulation/Gait                   Stairs             Wheelchair Mobility     Tilt Bed    Modified Rankin (Stroke Patients Only)       Balance                                            Cognition                                                 Exercises Other Exercises Other Exercises: seated LAQ 2x10 bilat Other Exercises: Seated marching 2x10 bilat    General Comments General comments (skin integrity, edema, etc.): vitals monitored throughout with HR up to 128 with moving to EOB, 117 with SPT; sp02 dropped to 83% on 4-5L with improved to 90+ with ~1-2 mins of rest and PLB      Pertinent Vitals/Pain Pain Assessment Pain Assessment: No/denies pain    Home Living Family/patient expects to be discharged to:: Private residence Living Arrangements: Spouse/significant other Available Help at Discharge: Family Type of Home: Independent living facility [facility]) Home Access: Level entry       Home Layout: One level Home Equipment: Rollator (4 wheels);Cane - single point;Shower seat      Prior Function            PT Goals (current goals can now be found in the care plan  section) Acute Rehab PT Goals Patient Stated Goal: to be home by next Sunday for Christmas party PT Goal Formulation: With patient/family Time For Goal Achievement: 09/06/23 Potential to Achieve Goals: Fair Progress towards PT goals: Progressing toward goals    Frequency    Min 1X/week      PT Plan      Co-evaluation              AM-PAC PT "6 Clicks" Mobility   Outcome Measure  Help needed turning from your back to your side while in a flat bed without using bedrails?: A Lot Help needed moving from lying on your back to sitting on the side of a flat bed without using bedrails?: A Lot Help needed moving to and from a bed to a chair (including a wheelchair)?: A Lot Help needed standing up from a chair using your arms (e.g., wheelchair or bedside chair)?: A Lot Help needed to walk in hospital room?: A Little Help needed climbing 3-5 steps with a railing? : A Lot 6 Click Score: 13    End of Session Equipment Utilized During Treatment: Oxygen Activity Tolerance: Treatment limited secondary to  medical complications (Comment) (frequent sats at 88% despite increased flow rate; near continuous coughing) Patient left: in bed;with call bell/phone within reach Nurse Communication: Mobility status PT Visit Diagnosis: Unsteadiness on feet (R26.81);Muscle weakness (generalized) (M62.81)     Time: 1025-1040 PT Time Calculation (min) (ACUTE ONLY): 15 min  Charges:    $Therapeutic Exercise: 8-22 mins PT General Charges $$ ACUTE PT VISIT: 1 Visit                    12 :58 PM, 08/26/23 Rosamaria Lints, PT, DPT Physical Therapist - [facility]  [phone] (ASCOM)    Virginia Curl C 08/26/2023, 12:55 PM

## 2023-08-26 NOTE — Care Management Important Message (Signed)
Important Message  Patient Details  Name: PECOS MIMNAUGH MRN: [id number] Date of Birth: [birth year]   Important Message Given:  Yes - Medicare IM     Bernadette Hoit 08/26/2023, 3:05 PM

## 2023-08-26 NOTE — Progress Notes (Signed)
Progress Note   Patient: Kurt Aguilar NWG:[id number] DOB: 1946/03/11 DOA: 08/20/2023     6 DOS: the patient was seen and examined on 08/26/2023   Brief hospital course: Mr. Arth Hedinger is a 77 year old male with history of CAD status post CABG in 1997, hypertension, insulin-dependent diabetes mellitus, CKD stage IV, hyperlipidemia, gout, reactive airway symptoms, cryptogenic organizing pneumonia, on chronic daily steroid, polycystic kidney disease, GERD, who presents to the emergency department for chief concerns of shortness of breath.  Patient was hypoxic requiring BiPAP, procalcitonin elevated, admitted to the hospitalist service for further management evaluation of severe sepsis secondary to pneumonia  Assessment and Plan: * Acute hypoxemic respiratory failure (HCC) Left lower lobe Pneumonia Severe sepsis [facility]) Patient meets sepsis criteria, with AKI Still has tachypnea, BP lower side but improved. Blood cultures x 2 pending. MRSA negative, vancomycin discontinued Procalcitonin elevated. He is weaned off bipap to 40% now on 4L supplemental oxygen.   --received 5 days of meropenem and azithromycin.  Sputum with GPC, no Staph aureus or Pseudomonas seen.  Abx transitioned to ceftriaxone for 2 more days and completed a 7-day course. --Continue supplemental O2 to keep sats >=90%, wean as tolerated  Pulmonary infiltrates with high ESR  Tracheomalacia - IS, Flutter valvue, chest PT ordered. Continue Robitussin, DuoNebs. Tessalon antitussives. CT chest done showed findings of tracheomalacia. Pulmonologist advised  -pulm clearance regimen (3% NS nebs, followed by albuterol nebs followed by Flutter followed by IS twice daily). Purse lip breathing, pulmonary rehab. --cont prednisone 20 mg daily, need very slow taper  Acute on chronic kidney disease stage 4 --Cr 3.07 on presentation, up to 3.52 this morning.  creatinine 2.23 on 04/15/23.  --nephro consult today --hold home oral lasix    CAD (coronary artery disease) Status post CABG in 1997 Continue rosuvastatin 40 mg nightly, aspirin 81 mg daily.  Essential hypertension --hold home oral lasix  Hyperlipidemia associated with type 2 diabetes mellitus (HCC) Continue rosuvastatin 40 mg daily, ezetimibe 10 mg daily.  Type 2 diabetes mellitus: NovoLog 70/30 30 units with breakfast. --ACHS and SSI  Hypokalemia --monitor and supplement PRN  OSA on CPAP.    Obesity with BMI 36.38. Diet, exercise and weight reduction advised.  Chronic Diastolic Heart Failure   Out of bed to chair. Incentive spirometry. Nursing supportive care. Fall, aspiration precautions. DVT prophylaxis: heparin injection   Code Status: Limited: Do not attempt resuscitation (DNR) -DNR-LIMITED -Do Not Intubate/DNI   Subjective:  Pt reported breathing improved today.  PT noted HR went up to 120's with exertion.   Physical Exam: Vitals:   08/26/23 0600 08/26/23 0757 08/26/23 1618 08/26/23 1938  BP:  (!) 131/114 138/87 (!) 137/90  Pulse: 92 88 (!) 105 (!) 107  Resp:    19  Temp:  97.6 F (36.4 C) 98.3 F (36.8 C) 97.8 F (36.6 C)  TempSrc:    Oral  SpO2: 95% 96% 98% 100%  Weight:      Height:        Constitutional: NAD, AAOx3 HEENT: conjunctivae and lids normal, EOMI CV: No cyanosis.   RESP: normal respiratory effort Neuro: II - XII grossly intact.   Psych: Normal mood and affect.  Appropriate judgement and reason   Data Reviewed:      Latest Ref Rng & Units 08/26/2023    8:25 AM 08/25/2023    4:42 AM 08/23/2023    2:48 AM  CBC  WBC 4.0 - 10.5 K/uL 12.3  12.1  7.0   Hemoglobin 13.0 -  17.0 g/dL 19.1  47.8  9.2   Hematocrit 39.0 - 52.0 % 33.7  34.3  28.3   Platelets 150 - 400 K/uL 271  218  149       Latest Ref Rng & Units 08/26/2023    7:11 AM 08/25/2023    1:46 PM 08/25/2023    4:42 AM  BMP  Glucose 70 - 99 mg/dL 295   621   BUN 8 - 23 mg/dL 79   74   Creatinine 3.08 - 1.24 mg/dL 6.57   8.46   Sodium 962 -  145 mmol/L 142   141   Potassium 3.5 - 5.1 mmol/L 4.6  4.3  5.4   Chloride 98 - 111 mmol/L 104   106   CO2 22 - 32 mmol/L 24   23   Calcium 8.9 - 10.3 mg/dL 8.7   8.7    No results found.  Family Communication:  Wife and daughter updated at bedside today.  Disposition: Status is: Inpatient Remains inpatient appropriate because: Sepsis with pneumonia, IV antibiotics, supplemental oxygen  Planned Discharge Destination: Rehab     MDM level 3:   Author: Darlin Priestly, MD 08/26/2023 9:13 PM Secure chat 7am to 7pm For on call review [email or web address].

## 2023-08-27 DIAGNOSIS — Z7952 Long term (current) use of systemic steroids: Secondary | ICD-10-CM

## 2023-08-27 DIAGNOSIS — R918 Other nonspecific abnormal finding of lung field: Secondary | ICD-10-CM | POA: Diagnosis not present

## 2023-08-27 DIAGNOSIS — T380X5A Adverse effect of glucocorticoids and synthetic analogues, initial encounter: Secondary | ICD-10-CM

## 2023-08-27 DIAGNOSIS — J9601 Acute respiratory failure with hypoxia: Secondary | ICD-10-CM | POA: Diagnosis not present

## 2023-08-27 DIAGNOSIS — G4733 Obstructive sleep apnea (adult) (pediatric): Secondary | ICD-10-CM | POA: Diagnosis not present

## 2023-08-27 DIAGNOSIS — D84821 Immunodeficiency due to drugs: Secondary | ICD-10-CM

## 2023-08-27 DIAGNOSIS — A419 Sepsis, unspecified organism: Secondary | ICD-10-CM | POA: Diagnosis not present

## 2023-08-27 LAB — BASIC METABOLIC PANEL
Anion gap: 13 (ref 5–15)
BUN: 80 mg/dL — ABNORMAL HIGH (ref 8–23)
CO2: 27 mmol/L (ref 22–32)
Calcium: 8.8 mg/dL — ABNORMAL LOW (ref 8.9–10.3)
Chloride: 102 mmol/L (ref 98–111)
Creatinine, Ser: 3.64 mg/dL — ABNORMAL HIGH (ref 0.61–1.24)
GFR, Estimated: 16 mL/min — ABNORMAL LOW (ref >60)
Glucose, Bld: 146 mg/dL — ABNORMAL HIGH (ref 70–99)
Potassium: 4.3 mmol/L (ref 3.5–5.1)
Sodium: 142 mmol/L (ref 135–145)

## 2023-08-27 LAB — GLUCOSE, CAPILLARY
Glucose-Capillary: 116 mg/dL — ABNORMAL HIGH (ref 70–99)
Glucose-Capillary: 123 mg/dL — ABNORMAL HIGH (ref 70–99)
Glucose-Capillary: 141 mg/dL — ABNORMAL HIGH (ref 70–99)
Glucose-Capillary: 212 mg/dL — ABNORMAL HIGH (ref 70–99)

## 2023-08-27 MED ORDER — BISOPROLOL FUMARATE 5 MG PO TABS
2.5000 mg | ORAL_TABLET | Freq: Every day | ORAL | Status: DC
  Administered 2023-08-27 – 2023-08-28 (×2): 2.5 mg via ORAL
  Filled 2023-08-27 (×2): qty 0.5

## 2023-08-27 NOTE — Progress Notes (Signed)
   08/27/23 2206  BiPAP/CPAP/SIPAP  $ Non-Invasive Home Ventilator  Subsequent  BiPAP/CPAP/SIPAP Pt Type Adult  BiPAP/CPAP/SIPAP Resmed  Mask Type Full face mask  Respiratory Rate 18 breaths/min  EPAP 10 cmH2O  Flow Rate 4 lpm  Patient Home Equipment Yes  Auto Titrate No

## 2023-08-27 NOTE — Progress Notes (Addendum)
Progress Note   Patient: Kurt Aguilar ZOX:[id number] DOB: [birth year] DOA: 08/20/2023     7 DOS: the patient was seen and examined on 08/27/2023   Brief hospital course: [name] is a 77 year old male with history of CAD status post CABG in 1997, hypertension, insulin-dependent diabetes mellitus, CKD stage IV, hyperlipidemia, gout, reactive airway symptoms, cryptogenic organizing pneumonia, on chronic daily steroid, polycystic kidney disease, GERD, who presents to the emergency department for chief concerns of shortness of breath.  Patient was hypoxic requiring BiPAP, procalcitonin elevated, admitted to the hospitalist service for further management evaluation of severe sepsis secondary to pneumonia.  Patient is seen by pulmonologist who recommended to treat for tracheomalacia with pulmonary toilet.  He finished antibiotic therapy.  Patient's kidney function worsened, nephrology consulted, heart rate elevated with PT session.  Patient has bed offer and insurance authorization per TOC  Assessment and Plan: * Acute hypoxemic respiratory failure (HCC) Left lower lobe Pneumonia Severe sepsis (HCC) Sepsis resolved Has tachycardia and tachypnea due to long-term pulmonary disease tracheomalacia Blood cultures so far negative Finish total 7 days of antibiotic therapy He is weaned off bipap to 40% now on 3-4L supplemental oxygen.  Continue to wean oxygen as tolerated, continue pulmonary toilet Continue to monitor on telemetry. Tachycardia is reactive - low dose bisoprolol ordered.  Pulmonary infiltrates with high ESR  Tracheomalacia - IS, Flutter valvue, chest PT ordered. Continue Robitussin, DuoNebs. CT chest done showed findings of tracheomalacia. Pulmonologist recommended-pulm clearance regimen (3% NS nebs, followed by albuterol nebs followed by Flutter followed by IS twice daily). Purse lip breathing, pulmonary rehab. States that he used to be on 10 mg 3 weeks ago, goes up on  steroids for exacerbation.  I advised him to taper steroids to 15 mg for 2 weeks then 10 mg to 2 weeks then 5 mg for 2 weeks, advised pulmonology follow-up.  Acute on chronic kidney disease stage IIIb: Continue to monitor daily renal function.   Kidney function worsened. Nephrology on board. Hold Lasix for now.  Avoid nephrotoxic drugs. Monitor daily renal function.  CAD (coronary artery disease) Status post CABG in 1997 Continue rosuvastatin 40 mg nightly, aspirin 81 mg daily. Patient will be continued on oral Lasix therapy.  Essential hypertension Caution with antihypertensives. Blood pressure border line low. Resume antihypertensives if BP remains elevated.  Hyperlipidemia associated with type 2 diabetes mellitus (HCC) Continue rosuvastatin 40 mg daily, ezetimibe 10 mg daily.  Type 2 diabetes mellitus: NovoLog 70/30 30 units with breakfast. Continue Accu-Cheks, sliding scale insulin. Carb consistent, cardiac diet ordered.  OSA on CPAP.  Advised wife to bring his CPAP machine as hospital CPAP is not fitting him. Obesity with BMI 36.38. Diet, exercise and weight reduction advised.  Out of bed to chair. Incentive spirometry. Nursing supportive care. Fall, aspiration precautions. DVT prophylaxis   Code Status: Limited: Do not attempt resuscitation (DNR) -DNR-LIMITED -Do Not Intubate/DNI   Subjective: Patient is seen and examined today morning.  He is sitting in chair.  Currently requiring 4 L supplemental oxygen.  Wife at bedside.  Has cough, wheezing.  Eating fair.  Physical Exam: Vitals:   08/27/23 0356 08/27/23 0803 08/27/23 1234 08/27/23 1642  BP: 120/77 118/65 (!) 79/59 122/69  Pulse: 79 (!) 101 (!) 54 (!) 103  Resp: 19 16 16 16   Temp: (!) 97.4 F (36.3 C) 97.8 F (36.6 C) 97.7 F (36.5 C) 98.3 F (36.8 C)  TempSrc: Oral  Oral   SpO2:  97% (!) 83% 100%  Weight:      Height:        General - Elderly obese Caucasian male, mild respiratory distress HEENT -  PERRLA, EOMI, atraumatic head, non tender sinuses. Lung -distant breath sounds, diffuse wheezes, tachypnea noted. Heart - S1, S2 heard, no murmurs, rubs, trace pitting pedal edema. Abdomen - Soft, non tender, distended, bowel sounds good Neuro - Alert, awake and oriented x 3, non focal exam. Skin - Warm and dry.  Data Reviewed:      Latest Ref Rng & Units 08/26/2023    8:25 AM 08/25/2023    4:42 AM 08/23/2023    2:48 AM  CBC  WBC 4.0 - 10.5 K/uL 12.3  12.1  7.0   Hemoglobin 13.0 - 17.0 g/dL 18.8  41.6  9.2   Hematocrit 39.0 - 52.0 % 33.7  34.3  28.3   Platelets 150 - 400 K/uL 271  218  149       Latest Ref Rng & Units 08/27/2023    9:36 AM 08/26/2023    7:11 AM 08/25/2023    1:46 PM  BMP  Glucose 70 - 99 mg/dL 606  301    BUN 8 - 23 mg/dL 80  79    Creatinine 6.01 - 1.24 mg/dL 0.93  2.35    Sodium 573 - 145 mmol/L 142  142    Potassium 3.5 - 5.1 mmol/L 4.3  4.6  4.3   Chloride 98 - 111 mmol/L 102  104    CO2 22 - 32 mmol/L 27  24    Calcium 8.9 - 10.3 mg/dL 8.8  8.7     No results found.  Family Communication: Discussed with patient, wife. They understand and agree. All questions answereed.  Disposition: Status is: Inpatient Remains inpatient appropriate because: AKI, weakness, supplemental oxygen, tele for HR  Planned Discharge Destination: Skilled nursing facility     Time spent 39 min.  Author: Marcelino Duster, MD 08/27/2023 5:52 PM Secure chat 7am to 7pm For on call review [email or web address].

## 2023-08-27 NOTE — Progress Notes (Signed)
PT Cancellation Note  Patient Details Name: Kurt Aguilar MRN: [id number] DOB: [birth year]   Cancelled Treatment:    Reason Eval/Treat Not Completed: Medical issues which prohibited therapy (Per chart pt appears hypotensive (MAP 66) and sats at 84%. WIll defer PT services to later date/time.)  3:03 PM, 08/27/23 Rosamaria Lints, PT, DPT Physical Therapist - [facility]  [phone] (ASCOM)    [name] 08/27/2023, 3:03 PM

## 2023-08-27 NOTE — TOC Progression Note (Addendum)
Transition of Care [facility]) - Progression Note    Patient Details  Name: Kurt Aguilar MRN: [id number] Date of Birth: 12/16/1945  Transition of Care [facility]) CM/SW Contact  Margarito Liner, LCSW Phone Number: 08/27/2023, 9:07 AM  Clinical Narrative:  SNF auth approved: [id number]. Valid for 7 days. [facility] admissions coordinator is aware and confirmed they have a bed today if he is stable. EMS authorization is pending but HTA liaison said it should not take long to obtain if he qualifies.   11:47 am: EMS auth approved: [id number].  Expected Discharge Plan and Services                                               Social Determinants of Health (SDOH) Interventions SDOH Screenings   Food Insecurity: No Food Insecurity (08/21/2023)  Housing: Low Risk  (08/21/2023)  Transportation Needs: No Transportation Needs (08/21/2023)  Utilities: Not At Risk (08/21/2023)  Alcohol Screen: Low Risk  (04/30/2023)  Depression (PHQ2-9): Low Risk  (04/30/2023)  Financial Resource Strain: Low Risk  (04/30/2023)  Physical Activity: Insufficiently Active (04/30/2023)  Social Connections: Socially Integrated (04/30/2023)  Stress: No Stress Concern Present (04/30/2023)  Tobacco Use: Medium Risk (08/20/2023)  Health Literacy: Adequate Health Literacy (04/30/2023)    Readmission Risk Interventions    09/20/2021    9:36 AM  Readmission Risk Prevention Plan  Transportation Screening Complete  Medication Review (RN Care Manager) Referral to Pharmacy  PCP or Specialist appointment within 3-5 days of discharge Not Complete  PCP/Specialist Appt Not Complete comments pending patient is not medically stable but does have a PCP  HRI or Home Care Consult Complete  SW Recovery Care/Counseling Consult Complete  Palliative Care Screening Not Applicable  Skilled Nursing Facility Not Applicable

## 2023-08-27 NOTE — Progress Notes (Signed)
[facility]  ROUNDING NOTE   Subjective:   Kurt Aguilar is a 77 year old male with past medical conditions including hypertension, diabetes, hyperlipidemia, gout, CAD status post CABG in 1997, GERD, polycystic kidney disease, and chronic kidney disease stage IV.  Patient presented to the emergency department with shortness of breath and was admitted at that time for Acute respiratory failure with hypoxia (HCC) [J96.01] Acute hypoxemic respiratory failure (HCC) [J96.01] Community acquired pneumonia of left lower lobe of lung [J18.9]  Patient is known to our practice and is followed outpatient by Dr. Thedore Mins.   Patient seen laying in bed Alert and oriented, no family present States he feels well today Appetite appropriate Remains on room air Lower extremity edema slowly improving  Creatinine 3.64 Urine output 1.2 L recorded overnight  Objective:  Vital signs in last 24 hours:  Temp:  [97.4 F (36.3 C)-98.3 F (36.8 C)] 97.7 F (36.5 C) (12/19 1234) Pulse Rate:  [54-107] 54 (12/19 1234) Resp:  [16-19] 16 (12/19 1234) BP: (79-148)/(59-90) 79/59 (12/19 1234) SpO2:  [83 %-100 %] 83 % (12/19 1234)  Weight change:  Filed Weights   08/20/23 1101 08/20/23 2246 08/23/23 2056  Weight: 111.6 kg 115 kg 117.3 kg    Intake/Output: I/O last 3 completed shifts: In: 890 [P.O.:840; IV Piggyback:50] Out: 2350 [Urine:2350]   Intake/Output this shift:  Total I/O In: 120 [P.O.:120] Out: 350 [Urine:350]  Physical Exam: General: NAD  Head: Normocephalic, atraumatic. Moist oral mucosal membranes  Eyes: Anicteric  Lungs:  Clear to auscultation, normal effort  Heart: Regular rate and rhythm  Abdomen:  Soft, nontender  Extremities:  2+ peripheral edema.  Neurologic: Nonfocal, moving all four extremities  Skin: No lesions       Basic Metabolic Panel: Recent Labs  Lab 08/22/23 0256 08/23/23 0248 08/25/23 0442 08/25/23 1346 08/26/23 0711 08/27/23 0936  NA 136 141  141  --  142 142  K 3.3* 4.1 5.4* 4.3 4.6 4.3  CL 102 107 106  --  104 102  CO2 22 26 23   --  24 27  GLUCOSE 129* 153* 103*  --  115* 146*  BUN 66* 68* 74*  --  79* 80*  CREATININE 2.97* 2.96* 3.04*  --  3.52* 3.64*  CALCIUM 7.8* 8.1* 8.7*  --  8.7* 8.8*  MG  --   --  2.5*  --  2.5*  --     Liver Function Tests: No results for input(s): "AST", "ALT", "ALKPHOS", "BILITOT", "PROT", "ALBUMIN" in the last 168 hours.  No results for input(s): "LIPASE", "AMYLASE" in the last 168 hours. No results for input(s): "AMMONIA" in the last 168 hours.  CBC: Recent Labs  Lab 08/21/23 0327 08/22/23 0256 08/23/23 0248 08/25/23 0442 08/26/23 0825  WBC 10.0 8.2 7.0 12.1* 12.3*  HGB 10.1* 10.8* 9.2* 11.4* 11.0*  HCT 31.0* 32.5* 28.3* 34.3* 33.7*  MCV 99.0 100.6* 99.3 97.7 100.0  PLT 129* 137* 149* 218 271    Cardiac Enzymes: No results for input(s): "CKTOTAL", "CKMB", "CKMBINDEX", "TROPONINI" in the last 168 hours.  BNP: Invalid input(s): "POCBNP"  CBG: Recent Labs  Lab 08/26/23 1157 08/26/23 1620 08/26/23 2128 08/27/23 0804 08/27/23 1233  GLUCAP 170* 156* 168* 116* 123*    Microbiology: Results for orders placed or performed during the hospital encounter of 08/20/23  Blood Culture (routine x 2)     Status: None   Collection Time: 08/20/23 10:52 AM   Specimen: BLOOD  Result Value Ref Range Status  Specimen Description BLOOD BLOOD LEFT WRIST  Final   Special Requests   Final    BOTTLES DRAWN AEROBIC AND ANAEROBIC Blood Culture adequate volume   Culture   Final    NO GROWTH 5 DAYS Performed at [facility], [address] [place], [place] [address]    Report Status 08/25/2023 FINAL  Final  Blood Culture (routine x 2)     Status: None   Collection Time: 08/20/23 10:57 AM   Specimen: BLOOD  Result Value Ref Range Status   Specimen Description BLOOD BLOOD RIGHT FOREARM  Final   Special Requests   Final    BOTTLES DRAWN AEROBIC AND ANAEROBIC Blood Culture  adequate volume   Culture   Final    NO GROWTH 5 DAYS Performed at [facility], [address] [place], [place] [address]    Report Status 08/25/2023 FINAL  Final  Resp panel by RT-PCR (RSV, Flu A&B, Covid) Anterior Nasal Swab     Status: None   Collection Time: 08/20/23 11:09 AM   Specimen: Anterior Nasal Swab  Result Value Ref Range Status   SARS Coronavirus 2 by RT PCR NEGATIVE NEGATIVE Final    Comment: (NOTE) SARS-CoV-2 target nucleic acids are NOT DETECTED.  The SARS-CoV-2 RNA is generally detectable in upper respiratory specimens during the acute phase of infection. The lowest concentration of SARS-CoV-2 viral copies this assay can detect is 138 copies/mL. A negative result does not preclude SARS-Cov-2 infection and should not be used as the sole basis for treatment or other patient management decisions. A negative result may occur with  improper specimen collection/handling, submission of specimen other than nasopharyngeal swab, presence of viral mutation(s) within the areas targeted by this assay, and inadequate number of viral copies(<138 copies/mL). A negative result must be combined with clinical observations, patient history, and epidemiological information. The expected result is Negative.  Fact Sheet for Patients:  BloggerCourse.com  Fact Sheet for Healthcare Providers:  SeriousBroker.it  This test is no t yet approved or cleared by the [place] FDA and  has been authorized for detection and/or diagnosis of SARS-CoV-2 by FDA under an Emergency Use Authorization (EUA). This EUA will remain  in effect (meaning this test can be used) for the duration of the COVID-19 declaration under Section 564(b)(1) of the Act, 21 U.S.C.section 360bbb-3(b)(1), unless the authorization is terminated  or revoked sooner.       Influenza A by PCR NEGATIVE NEGATIVE Final   Influenza B by PCR NEGATIVE NEGATIVE Final     Comment: (NOTE) The Xpert Xpress SARS-CoV-2/FLU/RSV plus assay is intended as an aid in the diagnosis of influenza from Nasopharyngeal swab specimens and should not be used as a sole basis for treatment. Nasal washings and aspirates are unacceptable for Xpert Xpress SARS-CoV-2/FLU/RSV testing.  Fact Sheet for Patients: BloggerCourse.com  Fact Sheet for Healthcare Providers: SeriousBroker.it  This test is not yet approved or cleared by the [place] FDA and has been authorized for detection and/or diagnosis of SARS-CoV-2 by FDA under an Emergency Use Authorization (EUA). This EUA will remain in effect (meaning this test can be used) for the duration of the COVID-19 declaration under Section 564(b)(1) of the Act, 21 U.S.C. section 360bbb-3(b)(1), unless the authorization is terminated or revoked.     Resp Syncytial Virus by PCR NEGATIVE NEGATIVE Final    Comment: (NOTE) Fact Sheet for Patients: BloggerCourse.com  Fact Sheet for Healthcare Providers: SeriousBroker.it  This test is not yet approved or cleared by  the [employer] and has been authorized for detection and/or diagnosis of SARS-CoV-2 by FDA under an Emergency Use Authorization (EUA). This EUA will remain in effect (meaning this test can be used) for the duration of the COVID-19 declaration under Section 564(b)(1) of the Act, 21 U.S.C. section 360bbb-3(b)(1), unless the authorization is terminated or revoked.  Performed at [facility], [address] [place], [place] [address]   MRSA Next Gen by PCR, Nasal     Status: None   Collection Time: 08/20/23  3:31 PM   Specimen: Nasal Mucosa; Nasal Swab  Result Value Ref Range Status   MRSA by PCR Next Gen NOT DETECTED NOT DETECTED Final    Comment: (NOTE) The GeneXpert MRSA Assay (FDA approved for NASAL specimens only), is one component of a  comprehensive MRSA colonization surveillance program. It is not intended to diagnose MRSA infection nor to guide or monitor treatment for MRSA infections. Test performance is not FDA approved in patients less than 21 years old. Performed at [facility], [address] [place], [place] [address]   Expectorated Sputum Assessment w Gram Stain, Rflx to Resp Cult     Status: None   Collection Time: 08/21/23 11:14 AM   Specimen: Sputum  Result Value Ref Range Status   Specimen Description SPUTUM  Final   Special Requests NONE  Final   Sputum evaluation   Final    THIS SPECIMEN IS ACCEPTABLE FOR SPUTUM CULTURE Performed at [facility], [address] [place], [place] [address]    Report Status 08/21/2023 FINAL  Final  Culture, Respiratory w Gram Stain     Status: None   Collection Time: 08/21/23 11:14 AM   Specimen: SPU  Result Value Ref Range Status   Specimen Description   Final    SPUTUM Performed at [facility], [address] [place], [place] [address]    Special Requests   Final    NONE Reflexed from [phone] Performed at [facility], [address] [place], [place] [address]    Gram Stain   Final    ABUNDANT WBC PRESENT, PREDOMINANTLY PMN FEW GRAM POSITIVE COCCI    Culture   Final    MODERATE Normal respiratory flora-no Staph aureus or Pseudomonas seen Performed at [facility], [address], [place], [place] [address]    Report Status 08/24/2023 FINAL  Final  Aspergillus Ag, BAL/Serum     Status: None   Collection Time: 08/22/23  6:27 PM   Specimen: Vein  Result Value Ref Range Status   Aspergillus Ag, BAL/Serum 0.05 0.00 - 0.49 Index Final    Comment: (NOTE) Performed At: [facility] [address] [place], [place] [id number] Jolene Schimke MD KG:[id number]     Coagulation Studies: No results for input(s): "LABPROT", "INR" in the last 72 hours.  Urinalysis: No results for input(s): "COLORURINE",  "LABSPEC", "PHURINE", "GLUCOSEU", "HGBUR", "BILIRUBINUR", "KETONESUR", "PROTEINUR", "UROBILINOGEN", "NITRITE", "LEUKOCYTESUR" in the last 72 hours.  Invalid input(s): "APPERANCEUR"    Imaging: No results found.   Medications:      albuterol  2.5 mg Nebulization BID   allopurinol  100 mg Oral BID   aspirin EC  81 mg Oral Daily   benzonatate  200 mg Oral TID   bisoprolol  2.5 mg Oral Daily   cyanocobalamin  500 mcg Oral Daily   ezetimibe  10 mg Oral Daily   famotidine  20 mg Oral QHS   gabapentin  300 mg  Oral QHS   heparin  5,000 Units Subcutaneous Q8H   insulin aspart  0-5 Units Subcutaneous QHS   insulin aspart  0-9 Units Subcutaneous TID WC   insulin aspart protamine- aspart  30 Units Subcutaneous Q breakfast   predniSONE  20 mg Oral Q breakfast   rosuvastatin  40 mg Oral Daily   senna-docusate  1 tablet Oral QHS   diphenhydrAMINE, meclizine, metoprolol tartrate, nitroGLYCERIN, ondansetron **OR** ondansetron (ZOFRAN) IV, mouth rinse  Assessment/ Plan:  [name] is a 77 y.o.  male with past medical conditions including hypertension, diabetes, hyperlipidemia, gout, CAD status post CABG in 1997, GERD, polycystic kidney disease, and chronic kidney disease stage IV.  Patient presented to the emergency department with shortness of breath and was admitted at that time for Acute respiratory failure with hypoxia (HCC) [J96.01] Acute hypoxemic respiratory failure (HCC) [J96.01] Community acquired pneumonia of left lower lobe of lung [J18.9]   Acute Kidney Injury on chronic kidney disease stage IV with baseline creatinine 2.23 on 04/15/23.  Acute kidney injury secondary to IV contrast exposure.  Chronic kidney disease is multifactorial: Diabetes and polycystic kidney disease, hypertension, obesity Creatinine stable today.  Adequate urine output recorded overnight.  Diuretics remain held.  No acute indication of dialysis.   Lab Results  Component Value Date   CREATININE  3.64 (H) 08/27/2023   CREATININE 3.52 (H) 08/26/2023   CREATININE 3.04 (H) 08/25/2023    Intake/Output Summary (Last 24 hours) at 08/27/2023 1558 Last data filed at 08/27/2023 0840 Gross per 24 hour  Intake 240 ml  Output 1550 ml  Net -1310 ml    2. Anemia of chronic kidney disease Lab Results  Component Value Date   HGB 11.0 (L) 08/26/2023    Hgb at goal. Will assess need for ESA during this admission.  3. Secondary Hyperparathyroidism: with outpatient labs: PTH 73, phosphorus 4.2, calcium 9.7 on 08/18/23.   Lab Results  Component Value Date   CALCIUM 8.8 (L) 08/27/2023   CAION 1.15 09/17/2021   PHOS 3.4 09/12/2016    Patient currently prescribed calcium carbonate with vitamin D outpatient.  Calcium within desired range.  4. Diabetes mellitus type II with chronic kidney disease/renal manifestations: insulin dependent. Home regimen includes semaglutide, Novolin 70/30 and lispro. Most recent hemoglobin A1c is 6.3 on 05/08/23.   Glucose well-controlled.    LOS: 7 Madissen Wyse 12/19/20243:58 PM

## 2023-08-27 NOTE — Plan of Care (Signed)
  Problem: Fluid Volume: Goal: Ability to maintain a balanced intake and output will improve Outcome: Progressing   Problem: Nutritional: Goal: Maintenance of adequate nutrition will improve Outcome: Progressing   Problem: Skin Integrity: Goal: Risk for impaired skin integrity will decrease Outcome: Progressing   Problem: Respiratory: Goal: Ability to maintain adequate ventilation will improve Outcome: Progressing   Problem: Clinical Measurements: Goal: Diagnostic test results will improve Outcome: Progressing

## 2023-08-28 DIAGNOSIS — D849 Immunodeficiency, unspecified: Secondary | ICD-10-CM | POA: Diagnosis not present

## 2023-08-28 DIAGNOSIS — Z7401 Bed confinement status: Secondary | ICD-10-CM | POA: Diagnosis not present

## 2023-08-28 DIAGNOSIS — D631 Anemia in chronic kidney disease: Secondary | ICD-10-CM | POA: Diagnosis not present

## 2023-08-28 DIAGNOSIS — K5900 Constipation, unspecified: Secondary | ICD-10-CM | POA: Diagnosis not present

## 2023-08-28 DIAGNOSIS — I25708 Atherosclerosis of coronary artery bypass graft(s), unspecified, with other forms of angina pectoris: Secondary | ICD-10-CM | POA: Diagnosis not present

## 2023-08-28 DIAGNOSIS — G4733 Obstructive sleep apnea (adult) (pediatric): Secondary | ICD-10-CM | POA: Diagnosis not present

## 2023-08-28 DIAGNOSIS — I129 Hypertensive chronic kidney disease with stage 1 through stage 4 chronic kidney disease, or unspecified chronic kidney disease: Secondary | ICD-10-CM | POA: Diagnosis not present

## 2023-08-28 DIAGNOSIS — I4510 Unspecified right bundle-branch block: Secondary | ICD-10-CM | POA: Diagnosis not present

## 2023-08-28 DIAGNOSIS — J9601 Acute respiratory failure with hypoxia: Secondary | ICD-10-CM | POA: Diagnosis not present

## 2023-08-28 DIAGNOSIS — I2510 Atherosclerotic heart disease of native coronary artery without angina pectoris: Secondary | ICD-10-CM | POA: Diagnosis not present

## 2023-08-28 DIAGNOSIS — R918 Other nonspecific abnormal finding of lung field: Secondary | ICD-10-CM | POA: Diagnosis not present

## 2023-08-28 DIAGNOSIS — K219 Gastro-esophageal reflux disease without esophagitis: Secondary | ICD-10-CM | POA: Diagnosis not present

## 2023-08-28 DIAGNOSIS — J189 Pneumonia, unspecified organism: Secondary | ICD-10-CM | POA: Diagnosis not present

## 2023-08-28 DIAGNOSIS — R531 Weakness: Secondary | ICD-10-CM | POA: Diagnosis not present

## 2023-08-28 DIAGNOSIS — J45909 Unspecified asthma, uncomplicated: Secondary | ICD-10-CM | POA: Diagnosis not present

## 2023-08-28 DIAGNOSIS — J8489 Other specified interstitial pulmonary diseases: Secondary | ICD-10-CM | POA: Diagnosis not present

## 2023-08-28 DIAGNOSIS — N184 Chronic kidney disease, stage 4 (severe): Secondary | ICD-10-CM | POA: Diagnosis not present

## 2023-08-28 DIAGNOSIS — N401 Enlarged prostate with lower urinary tract symptoms: Secondary | ICD-10-CM | POA: Diagnosis not present

## 2023-08-28 DIAGNOSIS — N179 Acute kidney failure, unspecified: Secondary | ICD-10-CM | POA: Diagnosis not present

## 2023-08-28 DIAGNOSIS — I10 Essential (primary) hypertension: Secondary | ICD-10-CM | POA: Diagnosis not present

## 2023-08-28 DIAGNOSIS — E1122 Type 2 diabetes mellitus with diabetic chronic kidney disease: Secondary | ICD-10-CM | POA: Diagnosis not present

## 2023-08-28 DIAGNOSIS — A419 Sepsis, unspecified organism: Secondary | ICD-10-CM | POA: Diagnosis not present

## 2023-08-28 DIAGNOSIS — M109 Gout, unspecified: Secondary | ICD-10-CM | POA: Diagnosis not present

## 2023-08-28 DIAGNOSIS — D84821 Immunodeficiency due to drugs: Secondary | ICD-10-CM | POA: Diagnosis not present

## 2023-08-28 DIAGNOSIS — J398 Other specified diseases of upper respiratory tract: Secondary | ICD-10-CM | POA: Diagnosis not present

## 2023-08-28 DIAGNOSIS — R6520 Severe sepsis without septic shock: Secondary | ICD-10-CM | POA: Diagnosis not present

## 2023-08-28 DIAGNOSIS — N2581 Secondary hyperparathyroidism of renal origin: Secondary | ICD-10-CM | POA: Diagnosis not present

## 2023-08-28 DIAGNOSIS — N138 Other obstructive and reflux uropathy: Secondary | ICD-10-CM | POA: Diagnosis not present

## 2023-08-28 DIAGNOSIS — Z951 Presence of aortocoronary bypass graft: Secondary | ICD-10-CM | POA: Diagnosis not present

## 2023-08-28 DIAGNOSIS — E785 Hyperlipidemia, unspecified: Secondary | ICD-10-CM | POA: Diagnosis not present

## 2023-08-28 LAB — CBC
HCT: 31.1 % — ABNORMAL LOW (ref 39.0–52.0)
Hemoglobin: 10.1 g/dL — ABNORMAL LOW (ref 13.0–17.0)
MCH: 32.8 pg (ref 26.0–34.0)
MCHC: 32.5 g/dL (ref 30.0–36.0)
MCV: 101 fL — ABNORMAL HIGH (ref 80.0–100.0)
Platelets: 292 10*3/uL (ref 150–400)
RBC: 3.08 MIL/uL — ABNORMAL LOW (ref 4.22–5.81)
RDW: 15 % (ref 11.5–15.5)
WBC: 7.2 10*3/uL (ref 4.0–10.5)
nRBC: 0.6 % — ABNORMAL HIGH (ref 0.0–0.2)

## 2023-08-28 LAB — GLUCOSE, CAPILLARY
Glucose-Capillary: 117 mg/dL — ABNORMAL HIGH (ref 70–99)
Glucose-Capillary: 109 mg/dL — ABNORMAL HIGH (ref 70–99)
Glucose-Capillary: 249 mg/dL — ABNORMAL HIGH (ref 70–99)

## 2023-08-28 LAB — BASIC METABOLIC PANEL
Anion gap: 15 (ref 5–15)
BUN: 86 mg/dL — ABNORMAL HIGH (ref 8–23)
CO2: 27 mmol/L (ref 22–32)
Calcium: 9 mg/dL (ref 8.9–10.3)
Chloride: 101 mmol/L (ref 98–111)
Creatinine, Ser: 3.73 mg/dL — ABNORMAL HIGH (ref 0.61–1.24)
GFR, Estimated: 16 mL/min — ABNORMAL LOW (ref >60)
Glucose, Bld: 117 mg/dL — ABNORMAL HIGH (ref 70–99)
Potassium: 4.6 mmol/L (ref 3.5–5.1)
Sodium: 143 mmol/L (ref 135–145)

## 2023-08-28 LAB — MAGNESIUM: Magnesium: 2.9 mg/dL — ABNORMAL HIGH (ref 1.7–2.4)

## 2023-08-28 MED ORDER — PREDNISONE 5 MG PO TABS: ORAL_TABLET | ORAL | Status: DC

## 2023-08-28 MED ORDER — ALBUTEROL SULFATE (2.5 MG/3ML) 0.083% IN NEBU: 2.5000 mg | INHALATION_SOLUTION | Freq: Two times a day (BID) | RESPIRATORY_TRACT | 12 refills | Status: DC

## 2023-08-28 MED ORDER — TORSEMIDE 20 MG PO TABS
40.0000 mg | ORAL_TABLET | Freq: Every day | ORAL | Status: DC
Start: 2023-08-28 — End: 2023-08-28
  Administered 2023-08-28: 40 mg via ORAL
  Filled 2023-08-28: qty 2

## 2023-08-28 MED ORDER — SENNOSIDES-DOCUSATE SODIUM 8.6-50 MG PO TABS: 1.0000 | ORAL_TABLET | Freq: Every day | ORAL | 2 refills | Status: DC

## 2023-08-28 MED ORDER — PREDNISONE 10 MG PO TABS
15.0000 mg | ORAL_TABLET | Freq: Every day | ORAL | Status: DC
Start: 2023-08-29 — End: 2023-08-28

## 2023-08-28 MED ORDER — BISOPROLOL FUMARATE 5 MG PO TABS: 2.5000 mg | ORAL_TABLET | Freq: Every day | ORAL | 2 refills | Status: DC

## 2023-08-28 MED ORDER — BENZONATATE 200 MG PO CAPS: 200.0000 mg | ORAL_CAPSULE | Freq: Three times a day (TID) | ORAL | 0 refills | Status: DC

## 2023-08-28 MED ORDER — TORSEMIDE 40 MG PO TABS: 40.0000 mg | ORAL_TABLET | Freq: Every day | ORAL | 0 refills | Status: DC

## 2023-08-28 NOTE — Plan of Care (Signed)
  Problem: Fluid Volume: Goal: Ability to maintain a balanced intake and output will improve Outcome: Progressing   Problem: Health Behavior/Discharge Planning: Goal: Ability to identify and utilize available resources and services will improve Outcome: Progressing   Problem: Metabolic: Goal: Ability to maintain appropriate glucose levels will improve Outcome: Progressing   Problem: Nutritional: Goal: Progress toward achieving an optimal weight will improve Outcome: Progressing   Problem: Skin Integrity: Goal: Risk for impaired skin integrity will decrease Outcome: Progressing

## 2023-08-28 NOTE — Consult Note (Signed)
[facility] Liaison Note  08/28/2023  [name] [birth year] [id number]  Location: RN Hospital Liaison screened the patient remotely at [facility].  Insurance: [employer]   Kurt Aguilar is a 77 y.o. male who is a Primary Care Patient of [facility], Audrie Gallus, MD - [facility] [employer] at [facility]. The patient was screened for  readmission hospitalization with noted high risk score for unplanned readmission risk with 1 IP in 6 months.  The patient was assessed for potential Care Management service needs for post hospital transition for care coordination. Review of patient's electronic medical record reveals patient was admitted with Acute Hypoxemic respiratory failure. Pt will discharge to SNF level of care. Facility will continue to address pt's ongoing needs.   VBCI Care Management/Population Health does not replace or interfere with any arrangements made by the Inpatient Transition of Care team.   For questions contact:   Elliot Cousin, RN, [facility] Liaison [place]   [facility], Population Health Office Hours MTWF  8:00 am-6:00 pm Direct Dial: [phone] mobile [phone] [Office toll free line] Office Hours are M-F 8:30 - 5 pm [email or web address]

## 2023-08-28 NOTE — Progress Notes (Signed)
Reviewed AVS with patient and spouse. PIV removed. All questions answered.

## 2023-08-28 NOTE — Progress Notes (Signed)
PT Cancellation Note  Patient Details Name: Kurt Aguilar MRN: [id number] DOB: 11/19/1945   Cancelled Treatment:    Reason Eval/Treat Not Completed: Other (comment) (pt in process of DC will defer PT at this time.)   Estel Scholze C 08/28/2023, 1:50 PM

## 2023-08-28 NOTE — TOC Transition Note (Signed)
Transition of Care [facility]) - Discharge Note   Patient Details  Name: Kurt Aguilar MRN: [id number] Date of Birth: 1945-09-15  Transition of Care [facility]) CM/SW Contact:  Margarito Liner, LCSW Phone Number: 08/28/2023, 2:09 PM   Clinical Narrative:   Patient has orders to discharge to [facility] today. RN will call report to [phone] (Room 116). EMS transport has been arranged and he is 6th on the list. No further concerns. CSW signing off.  Final next level of care: Skilled Nursing Facility Barriers to Discharge: Barriers Resolved   Patient Goals and CMS Choice     Choice offered to / list presented to : Patient, Spouse      Discharge Placement   Existing PASRR number confirmed : 08/24/23          Patient [facility] bed at: [facility] Patient to be transferred to facility by: EMS Name of family member notified: Governor Specking Patient and family notified of of transfer: 08/28/23  Discharge Plan and Services Additional resources added to the After Visit Summary for                                       Social Drivers of Health (SDOH) Interventions SDOH Screenings   Food Insecurity: No Food Insecurity (08/21/2023)  Housing: Low Risk  (08/21/2023)  Transportation Needs: No Transportation Needs (08/21/2023)  Utilities: Not At Risk (08/21/2023)  Alcohol Screen: Low Risk  (04/30/2023)  Depression (PHQ2-9): Low Risk  (04/30/2023)  Financial Resource Strain: Low Risk  (04/30/2023)  Physical Activity: Insufficiently Active (04/30/2023)  Social Connections: Socially Integrated (04/30/2023)  Stress: No Stress Concern Present (04/30/2023)  Tobacco Use: Medium Risk (08/20/2023)  Health Literacy: Adequate Health Literacy (04/30/2023)     Readmission Risk Interventions    09/20/2021    9:36 AM  Readmission Risk Prevention Plan  Transportation Screening Complete  Medication Review (RN Care Manager) Referral to Pharmacy  PCP or Specialist appointment within 3-5 days  of discharge Not Complete  PCP/Specialist Appt Not Complete comments pending patient is not medically stable but does have a PCP  HRI or Home Care Consult Complete  SW Recovery Care/Counseling Consult Complete  Palliative Care Screening Not Applicable  Skilled Nursing Facility Not Applicable

## 2023-08-28 NOTE — Discharge Summary (Signed)
Physician Discharge Summary   Patient: Kurt Aguilar MRN: [id number] DOB: February 20, 1946  Admit date:     08/20/2023  Discharge date: 08/28/23  Discharge Physician: Marcelino Duster   PCP: Judy Pimple, MD   Recommendations at discharge:   PCP follow up in 1 week. Pulmonary follow up in 2-3 weeks. Cardiology follow up as scheduled. Nephrology follow-up with repeat BMP in 2 weeks  Discharge Diagnoses: Principal Problem:   Acute hypoxemic respiratory failure (HCC) Active Problems:   Severe sepsis (HCC)   Pulmonary infiltrates with high  ESR c/w BOOP/ idiopathic    Hyperlipidemia associated with type 2 diabetes mellitus (HCC)   Obstructive sleep apnea   Essential hypertension   Coronary artery disease of bypass graft of native heart with stable angina pectoris (HCC)   GERD   CAD (coronary artery disease)   Constipation   BPH with obstruction/lower urinary tract symptoms   Cough   Hypotension   Immunocompromised due to corticosteroids (HCC)   Muscular chest pain  Resolved Problems:   * No resolved hospital problems. [facility] Course: [name] is a 77 year old male with history of CAD status post CABG in 1997, hypertension, insulin-dependent diabetes mellitus, CKD stage IV, hyperlipidemia, gout, reactive airway symptoms, cryptogenic organizing pneumonia, on chronic daily steroid, polycystic kidney disease, GERD, who presents to the emergency department for chief concerns of shortness of breath.   Patient was hypoxic requiring BiPAP, procalcitonin elevated, admitted to the hospitalist service for further management evaluation of severe sepsis secondary to pneumonia.  Patient is seen by pulmonologist who recommended to treat for tracheomalacia with pulmonary toilet.  He finished antibiotic therapy.  Patient's kidney function worsened, nephrology consulted, heart rate elevated with PT session.  Nephro advised   Assessment and Plan: * Acute hypoxemic respiratory  failure (HCC) Left lower lobe Pneumonia Severe sepsis (HCC) Sepsis resolved Has tachycardia and tachypnea due to long-term pulmonary disease, tracheomalacia, COP Blood cultures so far negative Finish total 7 days of antibiotic therapy He is weaned off bipap to 40% now on 3-4L supplemental oxygen.  Continue to wean oxygen as tolerated, continue pulmonary toilet Tachycardia is reactive - low dose bisoprolol ordered.  Outpatient cardiology follow-up as scheduled.   Pulmonary infiltrates with high ESR  Tracheomalacia - IS, Flutter valvue, chest PT ordered. Continue Robitussin, DuoNebs. CT chest done showed findings of tracheomalacia. Pulmonologist recommended pulm clearance regimen (3% NS nebs, followed by albuterol nebs followed by Flutter followed by IS twice daily). Purse lip breathing, pulmonary rehab.  Chest PT. Slow taper of steroids -prednisone 15mg  for 2 weeks, then 10mg   for 2 weeks then 5 mg order placed.  Pulmonary follow up in 2- 3 weeks   Acute on chronic kidney disease stage 4: Baseline creatinine 2.2- 3.0  Kidney function worsened. Creatinine around 3.7. AKI in the setting of IV contrast exposure. Nephrology on board. Advised outpatient follow up. Torsemide 40mg  daily.   Avoid other nephrotoxic drugs. Monitor daily renal function.   CAD (coronary artery disease) Status post CABG in 1997 Continue rosuvastatin 40 mg nightly, aspirin 81 mg daily.   Essential hypertension Blood pressure stable. Started on bisoprolol for tachycardia.   Hyperlipidemia associated with type 2 diabetes mellitus (HCC) Continue rosuvastatin 40 mg daily, ezetimibe 10 mg daily.   Type 2 diabetes mellitus: Continue home regimen NovoLog 70/30, ozempic, lispro Continue Accu-Cheks. Carb consistent, cardiac diet ordered.   OSA on CPAP.  CPAP at night Obesity with BMI 36.38. Diet, exercise and weight reduction advised.  Consultants: Nephrology, pulmonary Procedures performed:  None Disposition: Skilled nursing facility Diet recommendation:  Discharge Diet Orders (From admission, onward)     Start     Ordered   08/28/23 0000  Diet - low sodium heart healthy        08/28/23 1232           Cardiac and Carb modified diet DISCHARGE MEDICATION: Allergies as of 08/28/2023       Reactions   Oxycodone-acetaminophen Other (See Comments)   Stops breathing; has tolerated Tylenol before   Oxycodone-acetaminophen Anaphylaxis        Medication List     PAUSE taking these medications    allopurinol 100 MG tablet Wait to take this until your doctor or other care provider tells you to start again. Commonly known as: ZYLOPRIM TAKE 1 TABLET BY MOUTH TWICE A DAY       STOP taking these medications    furosemide 20 MG tablet Commonly known as: LASIX       TAKE these medications    albuterol (2.5 MG/3ML) 0.083% nebulizer solution Commonly known as: PROVENTIL Take 3 mLs (2.5 mg total) by nebulization 2 (two) times daily.   Aspirin 81 81 MG tablet Generic drug: aspirin EC Take 81 mg by mouth daily. Swallow whole.   BD Pen Needle Nano 2nd Gen 32G X 4 MM Misc Generic drug: Insulin Pen Needle USE 3 TIMES A DAY   benzonatate 200 MG capsule Commonly known as: TESSALON Take 1 capsule (200 mg total) by mouth 3 (three) times daily.   bisoprolol 5 MG tablet Commonly known as: ZEBETA Take 0.5 tablets (2.5 mg total) by mouth daily. Start taking on: August 29, 2023   Calcium-Vitamin D-Minerals 600-800 MG-UNIT Chew Chew 1 tablet by mouth daily.   colchicine 0.6 MG tablet Take 1 tablet (0.6 mg total) by mouth as needed (GOUT FLARE). TAKE 1 TABLET BY MOUTH TWICE A DAY WITH A MEAL AS NEEDED FOR ACUTE FLARE OF GOUT   cyanocobalamin 500 MCG tablet Commonly known as: VITAMIN B12 Take 500 mcg by mouth daily.   ezetimibe 10 MG tablet Commonly known as: ZETIA TAKE 1 TABLET BY MOUTH EVERY DAY   famotidine 20 MG tablet Commonly known as: PEPCID TAKE  1 TABLET BY MOUTH EVERYDAY AT BEDTIME   freestyle lancets 1 EACH BY OTHER ROUTE 4 (FOUR) TIMES DAILY. E11.65   FreeStyle Libre 2 Reader Hardie Pulley USE AS INSTRUCTED TO CHECK BLOOD SUGARS.   FreeStyle Libre 2 Sensor Misc 1 Device by Does not apply route every 14 (fourteen) days.   FREESTYLE LITE test strip Generic drug: glucose blood 1 each by Other route 4 (four) times daily. E11.65   gabapentin 100 MG capsule Commonly known as: NEURONTIN Take 300 mg by mouth daily.   GLUCOSAMINE PO Take 1,500 mg by mouth 2 (two) times daily.   HumaLOG KwikPen 200 UNIT/ML KwikPen Generic drug: insulin lispro Inject 10-14 Units into the skin 2 (two) times daily before a meal. INJECT 12-15 UNITS INTO THE SKIN 2 (TWO) TIMES DAILY BEFORE A MEAL.   ketoconazole 2 % cream Commonly known as: NIZORAL Apply 1 Application topically 2 (two) times daily.   Lutein 20 MG Caps Take 20 mg by mouth daily.   meclizine 12.5 MG tablet Commonly known as: ANTIVERT TAKE 1-2 TABLETS BY MOUTH THREE TIMES A DAY AS NEEDED FOR DIZZINESS   nitroGLYCERIN 0.4 MG SL tablet Commonly known as: NITROSTAT Place 1 tablet (0.4 mg total) under the tongue every  5 (five) minutes x 3 doses as needed for chest pain.   NovoLIN 70/30 Kwikpen (70-30) 100 UNIT/ML KwikPen Generic drug: insulin isophane & regular human KwikPen Inject 30 Units into the skin daily with breakfast. And pen needles 1/day   omeprazole 40 MG capsule Commonly known as: PRILOSEC TAKE 1 CAPUSLE BY MOUTH 30- 60 MIN BEFORE YOUR FIRST AND LAST MEALS OF THE DAY   ondansetron 8 MG tablet Commonly known as: Zofran Take 1 tablet (8 mg total) by mouth every 8 (eight) hours as needed for nausea or vomiting. From narcotic pain medicine   Ozempic (2 MG/DOSE) 8 MG/3ML Sopn Generic drug: Semaglutide (2 MG/DOSE) Inject 2 mg into the skin once a week.   polyethylene glycol 17 g packet Commonly known as: MIRALAX / GLYCOLAX Take 17 g by mouth daily.   predniSONE 5 MG  tablet Commonly known as: DELTASONE Take 3 tablets (15 mg total) by mouth daily with breakfast for 14 days, THEN 2 tablets (10 mg total) daily with breakfast for 14 days, THEN 1 tablet (5 mg total) daily with breakfast for 14 days. 3 daily. Start taking on: August 28, 2023 What changed:  See the new instructions. Another medication with the same name was removed. Continue taking this medication, and follow the directions you see here.   PRESERVISION AREDS 2 PO Take by mouth.   rosuvastatin 40 MG tablet Commonly known as: CRESTOR TAKE 1 TABLET BY MOUTH EVERY DAY   senna-docusate 8.6-50 MG tablet Commonly known as: Senokot-S Take 1 tablet by mouth at bedtime.   Torsemide 40 MG Tabs Take 40 mg by mouth daily. Start taking on: August 29, 2023        Contact information for follow-up providers     Tower, Audrie Gallus, MD Follow up in 1 week(s).   Specialties: Family Medicine, Radiology Contact information: [address] [place] [address] [phone]         Antonieta Iba, MD Follow up in 3 week(s).   Specialty: Cardiology Contact information: [address] [place] [address] [phone]         Raechel Chute, MD Follow up in 2 week(s).   Specialty: Pulmonary Disease Contact information: [address] [place] [address] [phone]              Contact information for after-discharge care     Destination     [facility] .   Service: Skilled Nursing Contact information: [address] [place] [address] [phone]                    Discharge Exam: Ceasar Mons Weights   08/20/23 1101 08/20/23 2246 08/23/23 2056  Weight: 111.6 kg 115 kg 117.3 kg   General - Elderly obese Caucasian male, mild respiratory distress, cough HEENT - PERRLA, EOMI, atraumatic head, non tender sinuses. Lung -distant breath sounds, diffuse rales, wheezes, tachypnea  noted. Heart - S1, S2 heard, no murmurs, rubs, 2+ pitting pedal edema. Abdomen - Soft, non tender, distended, bowel sounds good Neuro - Alert, awake and oriented x 3, non focal exam. Skin - Warm and dry.  Condition at discharge: stable  The results of significant diagnostics from this hospitalization (including imaging, microbiology, ancillary and laboratory) are listed below for reference.   Imaging Studies: CT Chest High Resolution Result Date: 08/23/2023 CLINICAL DATA:  77 year old male with history of interstitial lung disease. Shortness of breath. EXAM: CT  CHEST WITHOUT CONTRAST TECHNIQUE: Multidetector CT imaging of the chest was performed following the standard protocol without intravenous contrast. High resolution imaging of the lungs, as well as inspiratory and expiratory imaging, was performed. RADIATION DOSE REDUCTION: This exam was performed according to the departmental dose-optimization program which includes automated exposure control, adjustment of the mA and/or kV according to patient size and/or use of iterative reconstruction technique. COMPARISON:  Chest CTA 12/19/2015. FINDINGS: Cardiovascular: Heart size is normal. There is no significant pericardial fluid, thickening or pericardial calcification. There is aortic atherosclerosis, as well as atherosclerosis of the great vessels of the mediastinum and the coronary arteries, including calcified atherosclerotic plaque in the left main, left anterior descending, left circumflex and right coronary arteries. Status post median sternotomy for CABG including LIMA to the LAD. Severe calcifications of the aortic valve and mitral annulus. Mediastinum/Nodes: Multiple prominent borderline enlarged mediastinal and hilar lymph nodes are incidentally noted. No definite pathologically enlarged lymph nodes are identified. Densely calcified subcarinal and right hilar lymph nodes are also incidentally noted. Esophagus is unremarkable in appearance. No  axillary lymphadenopathy. Lungs/Pleura: Small right and trace left pleural effusions lying dependently. Calcified pleural plaques in the left hemithorax. No calcified pleural plaques in the right hemithorax. Patchy multifocal peripheral predominant airspace consolidation throughout the lungs bilaterally, most evident in the dependent portions of the right lower lobe and right upper lobe. High-resolution images also demonstrates some patchy areas of ground-glass attenuation, septal thickening and thickening of the peribronchovascular interstitium, however, today's study is overall limited by motion and the presence of what appears to be acute airspace consolidation, limiting accurate assessment for underlying interstitial lung disease. Inspiratory and expiratory imaging demonstrates severe collapse of the trachea and mainstem bronchi during expiration indicative of tracheobronchomalacia. Upper Abdomen: Partially imaged low-attenuation lesion in the upper left retroperitoneum measuring at least 3 cm in diameter, incompletely characterized on today's noncontrast CT examination, but statistically likely an exophytic cyst in the upper pole of the left kidney (no imaging follow-up recommended). Diffuse low attenuation throughout the visualized hepatic parenchyma, indicative of a background of hepatic steatosis. Musculoskeletal: Median sternotomy wires. There are no aggressive appearing lytic or blastic lesions noted in the visualized portions of the skeleton. IMPRESSION: 1. Limited examination for assessment of interstitial lung disease based on presence of what appears to be multilobar pneumonia, most severe in the right lower lobe. If there is persistent clinical concern for interstitial lung disease (which is likely present in the background on today's study), repeat high-resolution chest CT should be performed after resolution of the patient's acute illness on an outpatient basis. 2. Small right and trace left pleural  effusions lying dependently. 3. Calcified pleural plaques in the left hemithorax suggesting prior left-sided empyema and/or left-sided thoracic trauma. No right-sided calcified pleural plaques are noted to indicate asbestos related pleural disease at this time. 4. Severe tracheobronchomalacia. 5. Aortic atherosclerosis, in addition to left main and three-vessel coronary artery disease. Status post median sternotomy for CABG including LIMA to the LAD. 6. There are calcifications of the aortic valve and mitral annulus. Echocardiographic correlation for evaluation of potential valvular dysfunction may be warranted if clinically indicated. 7. Additional incidental findings, as above. Aortic Atherosclerosis (ICD10-I70.0). Electronically Signed   By: Trudie Reed M.D.   On: 08/23/2023 06:23   DG Chest 1 View Result Date: 08/22/2023 CLINICAL DATA:  Tachypnea EXAM: CHEST  1 VIEW COMPARISON:  Chest x-ray 08/20/2023 FINDINGS: Cardiomediastinal silhouette is enlarged, unchanged. There is a stable small left pleural effusion with  patchy left basilar opacities. Left-sided calcified pleural plaques are again noted. There is no pneumothorax or acute fracture. Sternotomy wires are present. IMPRESSION: 1. Stable small left pleural effusion with patchy left basilar opacities. 2. Left-sided calcified pleural plaques. Electronically Signed   By: Darliss Cheney M.D.   On: 08/22/2023 19:08   DG Chest Port 1 View Result Date: 08/20/2023 CLINICAL DATA:  Questionable sepsis - evaluate for abnormality. EXAM: PORTABLE CHEST 1 VIEW COMPARISON:  06/19/2023. FINDINGS: Low lung volume. There is new left basilar opacity with probable trace left pleural effusion. There is minimal blunting of right lateral costophrenic angle as well, which may represent trace right pleural effusion. Bilateral lungs are otherwise clear. Redemonstration of left lateral pleural calcification, nonspecific but commonly seen with asbestos related disease. No  pneumothorax on either side. No pulmonary edema. Stable cardio-mediastinal silhouette. No acute osseous abnormalities. The soft tissues are within normal limits. IMPRESSION: *New left basilar opacity, which may represent combination of atelectasis and/or pneumonia. There is associated trace left pleural effusion. Follow up to clearing is recommended. *Probable new trace right pleural effusion.  No pulmonary edema. Electronically Signed   By: Jules Schick M.D.   On: 08/20/2023 13:21    Microbiology: Results for orders placed or performed during the hospital encounter of 08/20/23  Blood Culture (routine x 2)     Status: None   Collection Time: 08/20/23 10:52 AM   Specimen: BLOOD  Result Value Ref Range Status   Specimen Description BLOOD BLOOD LEFT WRIST  Final   Special Requests   Final    BOTTLES DRAWN AEROBIC AND ANAEROBIC Blood Culture adequate volume   Culture   Final    NO GROWTH 5 DAYS Performed at [facility] [place], [address], [place], [place] [address]    Report Status 08/25/2023 FINAL  Final  Blood Culture (routine x 2)     Status: None   Collection Time: 08/20/23 10:57 AM   Specimen: BLOOD  Result Value Ref Range Status   Specimen Description BLOOD BLOOD RIGHT FOREARM  Final   Special Requests   Final    BOTTLES DRAWN AEROBIC AND ANAEROBIC Blood Culture adequate volume   Culture   Final    NO GROWTH 5 DAYS Performed at [facility], [address] [place], [place] [address]    Report Status 08/25/2023 FINAL  Final  Resp panel by RT-PCR (RSV, Flu A&B, Covid) Anterior Nasal Swab     Status: None   Collection Time: 08/20/23 11:09 AM   Specimen: Anterior Nasal Swab  Result Value Ref Range Status   SARS Coronavirus 2 by RT PCR NEGATIVE NEGATIVE Final    Comment: (NOTE) SARS-CoV-2 target nucleic acids are NOT DETECTED.  The SARS-CoV-2 RNA is generally detectable in upper respiratory specimens during the acute phase of infection. The  lowest concentration of SARS-CoV-2 viral copies this assay can detect is 138 copies/mL. A negative result does not preclude SARS-Cov-2 infection and should not be used as the sole basis for treatment or other patient management decisions. A negative result may occur with  improper specimen collection/handling, submission of specimen other than nasopharyngeal swab, presence of viral mutation(s) within the areas targeted by this assay, and inadequate number of viral copies(<138 copies/mL). A negative result must be combined with clinical observations, patient history, and epidemiological information. The expected result is Negative.  Fact Sheet for Patients:  BloggerCourse.com  Fact Sheet for Healthcare Providers:  SeriousBroker.it  This test is no t yet approved or  cleared by the [place] and  has been authorized for detection and/or diagnosis of SARS-CoV-2 by FDA under an Emergency Use Authorization (EUA). This EUA will remain  in effect (meaning this test can be used) for the duration of the COVID-19 declaration under Section 564(b)(1) of the Act, 21 U.S.C.section 360bbb-3(b)(1), unless the authorization is terminated  or revoked sooner.       Influenza A by PCR NEGATIVE NEGATIVE Final   Influenza B by PCR NEGATIVE NEGATIVE Final    Comment: (NOTE) The Xpert Xpress SARS-CoV-2/FLU/RSV plus assay is intended as an aid in the diagnosis of influenza from Nasopharyngeal swab specimens and should not be used as a sole basis for treatment. Nasal washings and aspirates are unacceptable for Xpert Xpress SARS-CoV-2/FLU/RSV testing.  Fact Sheet for Patients: BloggerCourse.com  Fact Sheet for Healthcare Providers: SeriousBroker.it  This test is not yet approved or cleared by the [place] FDA and has been authorized for detection and/or diagnosis of SARS-CoV-2 by FDA under  an Emergency Use Authorization (EUA). This EUA will remain in effect (meaning this test can be used) for the duration of the COVID-19 declaration under Section 564(b)(1) of the Act, 21 U.S.C. section 360bbb-3(b)(1), unless the authorization is terminated or revoked.     Resp Syncytial Virus by PCR NEGATIVE NEGATIVE Final    Comment: (NOTE) Fact Sheet for Patients: BloggerCourse.com  Fact Sheet for Healthcare Providers: SeriousBroker.it  This test is not yet approved or cleared by the [place] FDA and has been authorized for detection and/or diagnosis of SARS-CoV-2 by FDA under an Emergency Use Authorization (EUA). This EUA will remain in effect (meaning this test can be used) for the duration of the COVID-19 declaration under Section 564(b)(1) of the Act, 21 U.S.C. section 360bbb-3(b)(1), unless the authorization is terminated or revoked.  Performed at [facility], [address] [place], [place] [address]   MRSA Next Gen by PCR, Nasal     Status: None   Collection Time: 08/20/23  3:31 PM   Specimen: Nasal Mucosa; Nasal Swab  Result Value Ref Range Status   MRSA by PCR Next Gen NOT DETECTED NOT DETECTED Final    Comment: (NOTE) The GeneXpert MRSA Assay (FDA approved for NASAL specimens only), is one component of a comprehensive MRSA colonization surveillance program. It is not intended to diagnose MRSA infection nor to guide or monitor treatment for MRSA infections. Test performance is not FDA approved in patients less than 85 years old. Performed at [facility], [address] [place], [place] [address]   Expectorated Sputum Assessment w Gram Stain, Rflx to Resp Cult     Status: None   Collection Time: 08/21/23 11:14 AM   Specimen: Sputum  Result Value Ref Range Status   Specimen Description SPUTUM  Final   Special Requests NONE  Final   Sputum evaluation   Final    THIS SPECIMEN IS  ACCEPTABLE FOR SPUTUM CULTURE Performed at [facility], [address], [place], [place] [address]    Report Status 08/21/2023 FINAL  Final  Culture, Respiratory w Gram Stain     Status: None   Collection Time: 08/21/23 11:14 AM   Specimen: SPU  Result Value Ref Range Status   Specimen Description   Final    SPUTUM Performed at [facility], [address] [place], [place] [address]    Special Requests   Final    NONE Reflexed from [id number] Performed at [facility], [address] [place], [place] [address]    Gram Stain   Final    ABUNDANT WBC PRESENT, PREDOMINANTLY PMN FEW GRAM POSITIVE COCCI    Culture   Final    MODERATE Normal respiratory flora-no Staph aureus or Pseudomonas seen Performed at [facility], [address] [place], [place] [address]    Report Status 08/24/2023 FINAL  Final  Aspergillus Ag, BAL/Serum     Status: None   Collection Time: 08/22/23  6:27 PM   Specimen: Vein  Result Value Ref Range Status   Aspergillus Ag, BAL/Serum 0.05 0.00 - 0.49 Index Final    Comment: (NOTE) Performed At: [facility] [address] [place], [place] [id number] Jolene Schimke MD WG:[id number]     Labs: CBC: Recent Labs  Lab 08/22/23 0256 08/23/23 0248 08/25/23 0442 08/26/23 0825 08/28/23 0455  WBC 8.2 7.0 12.1* 12.3* 7.2  HGB 10.8* 9.2* 11.4* 11.0* 10.1*  HCT 32.5* 28.3* 34.3* 33.7* 31.1*  MCV 100.6* 99.3 97.7 100.0 101.0*  PLT 137* 149* 218 271 292   Basic Metabolic Panel: Recent Labs  Lab 08/23/23 0248 08/25/23 0442 08/25/23 1346 08/26/23 0711 08/27/23 0936 08/28/23 0455  NA 141 141  --  142 142 143  K 4.1 5.4* 4.3 4.6 4.3 4.6  CL 107 106  --  104 102 101  CO2 26 23  --  24 27 27   GLUCOSE 153* 103*  --  115* 146* 117*  BUN 68* 74*  --  79* 80* 86*  CREATININE 2.96* 3.04*  --  3.52* 3.64* 3.73*  CALCIUM 8.1* 8.7*  --  8.7* 8.8* 9.0  MG  --  2.5*  --  2.5*  --  2.9*   Liver Function Tests: No  results for input(s): "AST", "ALT", "ALKPHOS", "BILITOT", "PROT", "ALBUMIN" in the last 168 hours. CBG: Recent Labs  Lab 08/27/23 1233 08/27/23 1643 08/27/23 2139 08/28/23 0739 08/28/23 1105  GLUCAP 123* 141* 212* 117* 109*    Discharge time spent: 38 minutes.  Signed: Marcelino Duster, MD [facility] 08/28/2023

## 2023-08-28 NOTE — Plan of Care (Signed)
  Problem: Fluid Volume: Goal: Ability to maintain a balanced intake and output will improve Outcome: Progressing   Problem: Health Behavior/Discharge Planning: Goal: Ability to identify and utilize available resources and services will improve Outcome: Progressing Goal: Ability to manage health-related needs will improve Outcome: Progressing   Problem: Metabolic: Goal: Ability to maintain appropriate glucose levels will improve Outcome: Progressing   Problem: Nutritional: Goal: Maintenance of adequate nutrition will improve Outcome: Progressing Goal: Progress toward achieving an optimal weight will improve Outcome: Progressing   Problem: Skin Integrity: Goal: Risk for impaired skin integrity will decrease Outcome: Progressing   Problem: Fluid Volume: Goal: Hemodynamic stability will improve Outcome: Progressing   Problem: Clinical Measurements: Goal: Diagnostic test results will improve Outcome: Progressing Goal: Signs and symptoms of infection will decrease Outcome: Progressing   Problem: Respiratory: Goal: Ability to maintain adequate ventilation will improve Outcome: Progressing   Problem: Education: Goal: Knowledge of General Education information will improve Description: Including pain rating scale, medication(s)/side effects and non-pharmacologic comfort measures Outcome: Progressing   Problem: Health Behavior/Discharge Planning: Goal: Ability to manage health-related needs will improve Outcome: Progressing   Problem: Clinical Measurements: Goal: Ability to maintain clinical measurements within normal limits will improve Outcome: Progressing Goal: Will remain free from infection Outcome: Progressing Goal: Diagnostic test results will improve Outcome: Progressing Goal: Respiratory complications will improve Outcome: Progressing Goal: Cardiovascular complication will be avoided Outcome: Progressing   Problem: Activity: Goal: Risk for activity  intolerance will decrease Outcome: Progressing   Problem: Nutrition: Goal: Adequate nutrition will be maintained Outcome: Progressing   Problem: Elimination: Goal: Will not experience complications related to bowel motility Outcome: Progressing Goal: Will not experience complications related to urinary retention Outcome: Progressing   Problem: Pain Management: Goal: General experience of comfort will improve Outcome: Progressing   Problem: Safety: Goal: Ability to remain free from injury will improve Outcome: Progressing   Problem: Skin Integrity: Goal: Risk for impaired skin integrity will decrease Outcome: Progressing

## 2023-08-28 NOTE — Progress Notes (Signed)
[facility]  ROUNDING NOTE   Subjective:   Kurt Aguilar is a 77 year old male with past medical conditions including hypertension, diabetes, hyperlipidemia, gout, CAD status post CABG in 1997, GERD, polycystic kidney disease, and chronic kidney disease stage IV.  Patient presented to the emergency department with shortness of breath and was admitted at that time for Acute respiratory failure with hypoxia (HCC) [J96.01] Acute hypoxemic respiratory failure (HCC) [J96.01] Community acquired pneumonia of left lower lobe of lung [J18.9]  Patient is known to our practice and is followed outpatient by Dr. Thedore Mins.   Patient laying in bed  Wife at bedside Alert and oriented Cough with sputum production  Creatinine 3.73 Urine output 1.35 L recorded   Objective:  Vital signs in last 24 hours:  Temp:  [97.6 F (36.4 C)-98.3 F (36.8 C)] 98.2 F (36.8 C) (12/20 0743) Pulse Rate:  [73-103] 74 (12/20 0743) Resp:  [16-20] 18 (12/20 0743) BP: (122-136)/(69-89) 136/87 (12/20 0743) SpO2:  [96 %-100 %] 99 % (12/20 0743)  Weight change:  Filed Weights   08/20/23 1101 08/20/23 2246 08/23/23 2056  Weight: 111.6 kg 115 kg 117.3 kg    Intake/Output: I/O last 3 completed shifts: In: 240 [P.O.:240] Out: 2550 [Urine:2550]   Intake/Output this shift:  Total I/O In: 120 [P.O.:120] Out: 350 [Urine:350]  Physical Exam: General: NAD  Head: Normocephalic, atraumatic. Moist oral mucosal membranes  Eyes: Anicteric  Lungs:  Mild crackles, normal effort  Heart: Regular rate and rhythm  Abdomen:  Soft, nontender  Extremities:  2+ peripheral edema.  Neurologic: Nonfocal, moving all four extremities  Skin: No lesions       Basic Metabolic Panel: Recent Labs  Lab 08/23/23 0248 08/25/23 0442 08/25/23 1346 08/26/23 0711 08/27/23 0936 08/28/23 0455  NA 141 141  --  142 142 143  K 4.1 5.4* 4.3 4.6 4.3 4.6  CL 107 106  --  104 102 101  CO2 26 23  --  24 27 27   GLUCOSE 153*  103*  --  115* 146* 117*  BUN 68* 74*  --  79* 80* 86*  CREATININE 2.96* 3.04*  --  3.52* 3.64* 3.73*  CALCIUM 8.1* 8.7*  --  8.7* 8.8* 9.0  MG  --  2.5*  --  2.5*  --  2.9*    Liver Function Tests: No results for input(s): "AST", "ALT", "ALKPHOS", "BILITOT", "PROT", "ALBUMIN" in the last 168 hours.  No results for input(s): "LIPASE", "AMYLASE" in the last 168 hours. No results for input(s): "AMMONIA" in the last 168 hours.  CBC: Recent Labs  Lab 08/22/23 0256 08/23/23 0248 08/25/23 0442 08/26/23 0825 08/28/23 0455  WBC 8.2 7.0 12.1* 12.3* 7.2  HGB 10.8* 9.2* 11.4* 11.0* 10.1*  HCT 32.5* 28.3* 34.3* 33.7* 31.1*  MCV 100.6* 99.3 97.7 100.0 101.0*  PLT 137* 149* 218 271 292    Cardiac Enzymes: No results for input(s): "CKTOTAL", "CKMB", "CKMBINDEX", "TROPONINI" in the last 168 hours.  BNP: Invalid input(s): "POCBNP"  CBG: Recent Labs  Lab 08/27/23 1233 08/27/23 1643 08/27/23 2139 08/28/23 0739 08/28/23 1105  GLUCAP 123* 141* 212* 117* 109*    Microbiology: Results for orders placed or performed during the hospital encounter of 08/20/23  Blood Culture (routine x 2)     Status: None   Collection Time: 08/20/23 10:52 AM   Specimen: BLOOD  Result Value Ref Range Status   Specimen Description BLOOD BLOOD LEFT WRIST  Final   Special Requests   Final  BOTTLES DRAWN AEROBIC AND ANAEROBIC Blood Culture adequate volume   Culture   Final    NO GROWTH 5 DAYS Performed at [facility] [address] [place], [place] [address]    Report Status 08/25/2023 FINAL  Final  Blood Culture (routine x 2)     Status: None   Collection Time: 08/20/23 10:57 AM   Specimen: BLOOD  Result Value Ref Range Status   Specimen Description BLOOD BLOOD RIGHT FOREARM  Final   Special Requests   Final    BOTTLES DRAWN AEROBIC AND ANAEROBIC Blood Culture adequate volume   Culture   Final    NO GROWTH 5 DAYS Performed at [facility], [address]  [place], [place] [address]    Report Status 08/25/2023 FINAL  Final  Resp panel by RT-PCR (RSV, Flu A&B, Covid) Anterior Nasal Swab     Status: None   Collection Time: 08/20/23 11:09 AM   Specimen: Anterior Nasal Swab  Result Value Ref Range Status   SARS Coronavirus 2 by RT PCR NEGATIVE NEGATIVE Final    Comment: (NOTE) SARS-CoV-2 target nucleic acids are NOT DETECTED.  The SARS-CoV-2 RNA is generally detectable in upper respiratory specimens during the acute phase of infection. The lowest concentration of SARS-CoV-2 viral copies this assay can detect is 138 copies/mL. A negative result does not preclude SARS-Cov-2 infection and should not be used as the sole basis for treatment or other patient management decisions. A negative result may occur with  improper specimen collection/handling, submission of specimen other than nasopharyngeal swab, presence of viral mutation(s) within the areas targeted by this assay, and inadequate number of viral copies(<138 copies/mL). A negative result must be combined with clinical observations, patient history, and epidemiological information. The expected result is Negative.  Fact Sheet for Patients:  BloggerCourse.com  Fact Sheet for Healthcare Providers:  SeriousBroker.it  This test is no t yet approved or cleared by the [place] FDA and  has been authorized for detection and/or diagnosis of SARS-CoV-2 by FDA under an Emergency Use Authorization (EUA). This EUA will remain  in effect (meaning this test can be used) for the duration of the COVID-19 declaration under Section 564(b)(1) of the Act, 21 U.S.C.section 360bbb-3(b)(1), unless the authorization is terminated  or revoked sooner.       Influenza A by PCR NEGATIVE NEGATIVE Final   Influenza B by PCR NEGATIVE NEGATIVE Final    Comment: (NOTE) The Xpert Xpress SARS-CoV-2/FLU/RSV plus assay is intended as an aid in the diagnosis of  influenza from Nasopharyngeal swab specimens and should not be used as a sole basis for treatment. Nasal washings and aspirates are unacceptable for Xpert Xpress SARS-CoV-2/FLU/RSV testing.  Fact Sheet for Patients: BloggerCourse.com  Fact Sheet for Healthcare Providers: SeriousBroker.it  This test is not yet approved or cleared by the [place] FDA and has been authorized for detection and/or diagnosis of SARS-CoV-2 by FDA under an Emergency Use Authorization (EUA). This EUA will remain in effect (meaning this test can be used) for the duration of the COVID-19 declaration under Section 564(b)(1) of the Act, 21 U.S.C. section 360bbb-3(b)(1), unless the authorization is terminated or revoked.     Resp Syncytial Virus by PCR NEGATIVE NEGATIVE Final    Comment: (NOTE) Fact Sheet for Patients: BloggerCourse.com  Fact Sheet for Healthcare Providers: SeriousBroker.it  This test is not yet approved or cleared by the [place] FDA and has been authorized for detection and/or diagnosis of SARS-CoV-2 by FDA under an  Emergency Use Authorization (EUA). This EUA will remain in effect (meaning this test can be used) for the duration of the COVID-19 declaration under Section 564(b)(1) of the Act, 21 U.S.C. section 360bbb-3(b)(1), unless the authorization is terminated or revoked.  Performed at [facility], [address] [place], [place] [address]   MRSA Next Gen by PCR, Nasal     Status: None   Collection Time: 08/20/23  3:31 PM   Specimen: Nasal Mucosa; Nasal Swab  Result Value Ref Range Status   MRSA by PCR Next Gen NOT DETECTED NOT DETECTED Final    Comment: (NOTE) The GeneXpert MRSA Assay (FDA approved for NASAL specimens only), is one component of a comprehensive MRSA colonization surveillance program. It is not intended to diagnose MRSA infection nor to  guide or monitor treatment for MRSA infections. Test performance is not FDA approved in patients less than 71 years old. Performed at [facility], [address] [place], [place] [address]   Expectorated Sputum Assessment w Gram Stain, Rflx to Resp Cult     Status: None   Collection Time: 08/21/23 11:14 AM   Specimen: Sputum  Result Value Ref Range Status   Specimen Description SPUTUM  Final   Special Requests NONE  Final   Sputum evaluation   Final    THIS SPECIMEN IS ACCEPTABLE FOR SPUTUM CULTURE Performed at [facility], [address], [place], [place] [address]    Report Status 08/21/2023 FINAL  Final  Culture, Respiratory w Gram Stain     Status: None   Collection Time: 08/21/23 11:14 AM   Specimen: SPU  Result Value Ref Range Status   Specimen Description   Final    SPUTUM Performed at [facility] [address], [place], [place] [address]    Special Requests   Final    NONE Reflexed from [id number] Performed at [facility], [address] [place], [place] [address]    Gram Stain   Final    ABUNDANT WBC PRESENT, PREDOMINANTLY PMN FEW GRAM POSITIVE COCCI    Culture   Final    MODERATE Normal respiratory flora-no Staph aureus or Pseudomonas seen Performed at [facility], [address], [place], [place] [address]    Report Status 08/24/2023 FINAL  Final  Aspergillus Ag, BAL/Serum     Status: None   Collection Time: 08/22/23  6:27 PM   Specimen: Vein  Result Value Ref Range Status   Aspergillus Ag, BAL/Serum 0.05 0.00 - 0.49 Index Final    Comment: (NOTE) Performed At: [facility] [address] [place], [place] [id number] Jolene Schimke MD QQ:[id number]     Coagulation Studies: No results for input(s): "LABPROT", "INR" in the last 72 hours.  Urinalysis: No results for input(s): "COLORURINE", "LABSPEC", "PHURINE", "GLUCOSEU", "HGBUR", "BILIRUBINUR", "KETONESUR", "PROTEINUR", "UROBILINOGEN", "NITRITE",  "LEUKOCYTESUR" in the last 72 hours.  Invalid input(s): "APPERANCEUR"    Imaging: No results found.   Medications:      albuterol  2.5 mg Nebulization BID   aspirin EC  81 mg Oral Daily   benzonatate  200 mg Oral TID   bisoprolol  2.5 mg Oral Daily   cyanocobalamin  500 mcg Oral Daily   ezetimibe  10 mg Oral Daily   famotidine  20 mg Oral QHS   gabapentin  300 mg Oral QHS   heparin  5,000 Units Subcutaneous Q8H   insulin aspart  0-5 Units Subcutaneous QHS   insulin aspart  0-9 Units  Subcutaneous TID WC   insulin aspart protamine- aspart  30 Units Subcutaneous Q breakfast   [START ON 08/29/2023] predniSONE  15 mg Oral Q breakfast   rosuvastatin  40 mg Oral Daily   senna-docusate  1 tablet Oral QHS   torsemide  40 mg Oral Daily   diphenhydrAMINE, meclizine, metoprolol tartrate, nitroGLYCERIN, ondansetron **OR** ondansetron (ZOFRAN) IV, mouth rinse  Assessment/ Plan:  [name] is a 77 y.o.  male with past medical conditions including hypertension, diabetes, hyperlipidemia, gout, CAD status post CABG in 1997, GERD, polycystic kidney disease, and chronic kidney disease stage IV.  Patient presented to the emergency department with shortness of breath and was admitted at that time for Acute respiratory failure with hypoxia (HCC) [J96.01] Acute hypoxemic respiratory failure (HCC) [J96.01] Community acquired pneumonia of left lower lobe of lung [J18.9]   Acute Kidney Injury on chronic kidney disease stage IV with baseline creatinine 2.23 on 04/15/23.  Acute kidney injury secondary to IV contrast exposure.  Chronic kidney disease is multifactorial: Diabetes and polycystic kidney disease, hypertension, obesity  Renal function stable today. Adequate urine output noted. Will order Torsemide 40mg  daily, continue this at discharge.    Lab Results  Component Value Date   CREATININE 3.73 (H) 08/28/2023   CREATININE 3.64 (H) 08/27/2023   CREATININE 3.52 (H) 08/26/2023     Intake/Output Summary (Last 24 hours) at 08/28/2023 1400 Last data filed at 08/28/2023 1049 Gross per 24 hour  Intake 120 ml  Output 1350 ml  Net -1230 ml    2. Anemia of chronic kidney disease Lab Results  Component Value Date   HGB 10.1 (L) 08/28/2023    Hgb at goal. Will assess need for ESA during this admission.  3. Secondary Hyperparathyroidism: with outpatient labs: PTH 73, phosphorus 4.2, calcium 9.7 on 08/18/23.   Lab Results  Component Value Date   CALCIUM 9.0 08/28/2023   CAION 1.15 09/17/2021   PHOS 3.4 09/12/2016    Patient currently prescribed calcium carbonate with vitamin D outpatient.  Bone minerals acceptable.  4. Diabetes mellitus type II with chronic kidney disease/renal manifestations: insulin dependent. Home regimen includes semaglutide, Novolin 70/30 and lispro. Most recent hemoglobin A1c is 6.3 on 05/08/23.   Primary team to manage SSI    LOS: 8 Massimo Hartland 12/20/20242:00 PM

## 2023-08-28 NOTE — Progress Notes (Signed)
Attempted to call report to [facility]. Was told nurse was getting a new patient at this time. This nurse gave phone number for accepting nurse to contact for report.

## 2023-08-28 NOTE — Care Management Important Message (Signed)
Important Message  Patient Details  Name: Kurt Aguilar MRN: [id number] Date of Birth: 01/05/1946   Important Message Given:  Yes - Medicare IM     Bernadette Hoit 08/28/2023, 10:21 AM

## 2023-08-30 ENCOUNTER — Inpatient Hospital Stay: Payer: PPO

## 2023-08-30 ENCOUNTER — Inpatient Hospital Stay
Admission: EM | Admit: 2023-08-30 | Discharge: 2023-09-06 | DRG: 871 | Disposition: A | Discharge status: Skilled Nursing Facility | Payer: PPO | Source: Skilled Nursing Facility | Attending: Hospitalist | Admitting: Hospitalist

## 2023-08-30 ENCOUNTER — Emergency Department: Payer: PPO

## 2023-08-30 DIAGNOSIS — R252 Cramp and spasm: Secondary | ICD-10-CM | POA: Diagnosis not present

## 2023-08-30 DIAGNOSIS — N170 Acute kidney failure with tubular necrosis: Secondary | ICD-10-CM | POA: Diagnosis present

## 2023-08-30 DIAGNOSIS — G4733 Obstructive sleep apnea (adult) (pediatric): Secondary | ICD-10-CM | POA: Diagnosis not present

## 2023-08-30 DIAGNOSIS — N189 Chronic kidney disease, unspecified: Secondary | ICD-10-CM | POA: Diagnosis not present

## 2023-08-30 DIAGNOSIS — M109 Gout, unspecified: Secondary | ICD-10-CM | POA: Diagnosis not present

## 2023-08-30 DIAGNOSIS — J929 Pleural plaque without asbestos: Secondary | ICD-10-CM | POA: Diagnosis not present

## 2023-08-30 DIAGNOSIS — Z7401 Bed confinement status: Secondary | ICD-10-CM | POA: Diagnosis not present

## 2023-08-30 DIAGNOSIS — R6520 Severe sepsis without septic shock: Secondary | ICD-10-CM | POA: Diagnosis not present

## 2023-08-30 DIAGNOSIS — Z66 Do not resuscitate: Secondary | ICD-10-CM | POA: Diagnosis not present

## 2023-08-30 DIAGNOSIS — R2689 Other abnormalities of gait and mobility: Secondary | ICD-10-CM | POA: Diagnosis not present

## 2023-08-30 DIAGNOSIS — Y95 Nosocomial condition: Secondary | ICD-10-CM | POA: Diagnosis present

## 2023-08-30 DIAGNOSIS — K219 Gastro-esophageal reflux disease without esophagitis: Secondary | ICD-10-CM | POA: Diagnosis not present

## 2023-08-30 DIAGNOSIS — M1990 Unspecified osteoarthritis, unspecified site: Secondary | ICD-10-CM | POA: Diagnosis present

## 2023-08-30 DIAGNOSIS — D631 Anemia in chronic kidney disease: Secondary | ICD-10-CM | POA: Diagnosis not present

## 2023-08-30 DIAGNOSIS — J188 Other pneumonia, unspecified organism: Secondary | ICD-10-CM | POA: Diagnosis not present

## 2023-08-30 DIAGNOSIS — Z1152 Encounter for screening for COVID-19: Secondary | ICD-10-CM | POA: Diagnosis not present

## 2023-08-30 DIAGNOSIS — R7401 Elevation of levels of liver transaminase levels: Secondary | ICD-10-CM

## 2023-08-30 DIAGNOSIS — E872 Acidosis, unspecified: Secondary | ICD-10-CM | POA: Diagnosis present

## 2023-08-30 DIAGNOSIS — F1920 Other psychoactive substance dependence, uncomplicated: Secondary | ICD-10-CM

## 2023-08-30 DIAGNOSIS — E1140 Type 2 diabetes mellitus with diabetic neuropathy, unspecified: Secondary | ICD-10-CM | POA: Diagnosis not present

## 2023-08-30 DIAGNOSIS — Z951 Presence of aortocoronary bypass graft: Secondary | ICD-10-CM

## 2023-08-30 DIAGNOSIS — I2510 Atherosclerotic heart disease of native coronary artery without angina pectoris: Secondary | ICD-10-CM | POA: Diagnosis present

## 2023-08-30 DIAGNOSIS — G4700 Insomnia, unspecified: Secondary | ICD-10-CM | POA: Diagnosis not present

## 2023-08-30 DIAGNOSIS — Z794 Long term (current) use of insulin: Secondary | ICD-10-CM | POA: Diagnosis not present

## 2023-08-30 DIAGNOSIS — T380X5A Adverse effect of glucocorticoids and synthetic analogues, initial encounter: Secondary | ICD-10-CM | POA: Diagnosis present

## 2023-08-30 DIAGNOSIS — E1165 Type 2 diabetes mellitus with hyperglycemia: Secondary | ICD-10-CM | POA: Diagnosis not present

## 2023-08-30 DIAGNOSIS — E1121 Type 2 diabetes mellitus with diabetic nephropathy: Secondary | ICD-10-CM

## 2023-08-30 DIAGNOSIS — Z79899 Other long term (current) drug therapy: Secondary | ICD-10-CM

## 2023-08-30 DIAGNOSIS — N184 Chronic kidney disease, stage 4 (severe): Secondary | ICD-10-CM | POA: Diagnosis not present

## 2023-08-30 DIAGNOSIS — J1289 Other viral pneumonia: Secondary | ICD-10-CM | POA: Diagnosis not present

## 2023-08-30 DIAGNOSIS — I130 Hypertensive heart and chronic kidney disease with heart failure and stage 1 through stage 4 chronic kidney disease, or unspecified chronic kidney disease: Secondary | ICD-10-CM | POA: Diagnosis not present

## 2023-08-30 DIAGNOSIS — J8489 Other specified interstitial pulmonary diseases: Secondary | ICD-10-CM | POA: Diagnosis not present

## 2023-08-30 DIAGNOSIS — Z96652 Presence of left artificial knee joint: Secondary | ICD-10-CM | POA: Diagnosis present

## 2023-08-30 DIAGNOSIS — Z885 Allergy status to narcotic agent status: Secondary | ICD-10-CM

## 2023-08-30 DIAGNOSIS — E119 Type 2 diabetes mellitus without complications: Secondary | ICD-10-CM

## 2023-08-30 DIAGNOSIS — R040 Epistaxis: Secondary | ICD-10-CM | POA: Diagnosis not present

## 2023-08-30 DIAGNOSIS — R278 Other lack of coordination: Secondary | ICD-10-CM | POA: Diagnosis not present

## 2023-08-30 DIAGNOSIS — I080 Rheumatic disorders of both mitral and aortic valves: Secondary | ICD-10-CM | POA: Diagnosis present

## 2023-08-30 DIAGNOSIS — Z741 Need for assistance with personal care: Secondary | ICD-10-CM | POA: Diagnosis not present

## 2023-08-30 DIAGNOSIS — Z823 Family history of stroke: Secondary | ICD-10-CM

## 2023-08-30 DIAGNOSIS — I214 Non-ST elevation (NSTEMI) myocardial infarction: Secondary | ICD-10-CM | POA: Diagnosis not present

## 2023-08-30 DIAGNOSIS — Z7982 Long term (current) use of aspirin: Secondary | ICD-10-CM

## 2023-08-30 DIAGNOSIS — Z6832 Body mass index (BMI) 32.0-32.9, adult: Secondary | ICD-10-CM

## 2023-08-30 DIAGNOSIS — J9601 Acute respiratory failure with hypoxia: Secondary | ICD-10-CM | POA: Diagnosis not present

## 2023-08-30 DIAGNOSIS — R7989 Other specified abnormal findings of blood chemistry: Secondary | ICD-10-CM | POA: Diagnosis not present

## 2023-08-30 DIAGNOSIS — D84821 Immunodeficiency due to drugs: Secondary | ICD-10-CM | POA: Diagnosis not present

## 2023-08-30 DIAGNOSIS — I252 Old myocardial infarction: Secondary | ICD-10-CM

## 2023-08-30 DIAGNOSIS — R410 Disorientation, unspecified: Secondary | ICD-10-CM | POA: Diagnosis not present

## 2023-08-30 DIAGNOSIS — E669 Obesity, unspecified: Secondary | ICD-10-CM | POA: Diagnosis present

## 2023-08-30 DIAGNOSIS — J159 Unspecified bacterial pneumonia: Secondary | ICD-10-CM | POA: Diagnosis present

## 2023-08-30 DIAGNOSIS — I5032 Chronic diastolic (congestive) heart failure: Secondary | ICD-10-CM | POA: Diagnosis present

## 2023-08-30 DIAGNOSIS — J45909 Unspecified asthma, uncomplicated: Secondary | ICD-10-CM | POA: Diagnosis not present

## 2023-08-30 DIAGNOSIS — I4510 Unspecified right bundle-branch block: Secondary | ICD-10-CM | POA: Diagnosis present

## 2023-08-30 DIAGNOSIS — R918 Other nonspecific abnormal finding of lung field: Secondary | ICD-10-CM | POA: Diagnosis not present

## 2023-08-30 DIAGNOSIS — G8929 Other chronic pain: Secondary | ICD-10-CM | POA: Diagnosis not present

## 2023-08-30 DIAGNOSIS — N179 Acute kidney failure, unspecified: Secondary | ICD-10-CM | POA: Diagnosis not present

## 2023-08-30 DIAGNOSIS — E1159 Type 2 diabetes mellitus with other circulatory complications: Secondary | ICD-10-CM

## 2023-08-30 DIAGNOSIS — J9809 Other diseases of bronchus, not elsewhere classified: Secondary | ICD-10-CM | POA: Diagnosis present

## 2023-08-30 DIAGNOSIS — J398 Other specified diseases of upper respiratory tract: Secondary | ICD-10-CM | POA: Diagnosis not present

## 2023-08-30 DIAGNOSIS — A419 Sepsis, unspecified organism: Secondary | ICD-10-CM | POA: Diagnosis not present

## 2023-08-30 DIAGNOSIS — I2489 Other forms of acute ischemic heart disease: Secondary | ICD-10-CM | POA: Diagnosis not present

## 2023-08-30 DIAGNOSIS — M545 Low back pain, unspecified: Secondary | ICD-10-CM | POA: Diagnosis not present

## 2023-08-30 DIAGNOSIS — E785 Hyperlipidemia, unspecified: Secondary | ICD-10-CM | POA: Diagnosis present

## 2023-08-30 DIAGNOSIS — K760 Fatty (change of) liver, not elsewhere classified: Secondary | ICD-10-CM | POA: Diagnosis not present

## 2023-08-30 DIAGNOSIS — Z8249 Family history of ischemic heart disease and other diseases of the circulatory system: Secondary | ICD-10-CM

## 2023-08-30 DIAGNOSIS — E1122 Type 2 diabetes mellitus with diabetic chronic kidney disease: Secondary | ICD-10-CM | POA: Diagnosis not present

## 2023-08-30 DIAGNOSIS — I5033 Acute on chronic diastolic (congestive) heart failure: Secondary | ICD-10-CM

## 2023-08-30 DIAGNOSIS — R059 Cough, unspecified: Secondary | ICD-10-CM | POA: Diagnosis not present

## 2023-08-30 DIAGNOSIS — R939 Diagnostic imaging inconclusive due to excess body fat of patient: Secondary | ICD-10-CM | POA: Diagnosis not present

## 2023-08-30 DIAGNOSIS — R4189 Other symptoms and signs involving cognitive functions and awareness: Secondary | ICD-10-CM | POA: Diagnosis not present

## 2023-08-30 DIAGNOSIS — N2581 Secondary hyperparathyroidism of renal origin: Secondary | ICD-10-CM | POA: Diagnosis present

## 2023-08-30 DIAGNOSIS — I10 Essential (primary) hypertension: Secondary | ICD-10-CM | POA: Diagnosis present

## 2023-08-30 DIAGNOSIS — R6889 Other general symptoms and signs: Secondary | ICD-10-CM | POA: Diagnosis not present

## 2023-08-30 DIAGNOSIS — D849 Immunodeficiency, unspecified: Secondary | ICD-10-CM | POA: Diagnosis not present

## 2023-08-30 DIAGNOSIS — Z7952 Long term (current) use of systemic steroids: Secondary | ICD-10-CM

## 2023-08-30 DIAGNOSIS — J90 Pleural effusion, not elsewhere classified: Secondary | ICD-10-CM | POA: Diagnosis not present

## 2023-08-30 DIAGNOSIS — Z7985 Long-term (current) use of injectable non-insulin antidiabetic drugs: Secondary | ICD-10-CM

## 2023-08-30 DIAGNOSIS — R0902 Hypoxemia: Secondary | ICD-10-CM | POA: Diagnosis not present

## 2023-08-30 DIAGNOSIS — Z87891 Personal history of nicotine dependence: Secondary | ICD-10-CM | POA: Diagnosis not present

## 2023-08-30 DIAGNOSIS — R0602 Shortness of breath: Secondary | ICD-10-CM | POA: Diagnosis not present

## 2023-08-30 DIAGNOSIS — M79604 Pain in right leg: Secondary | ICD-10-CM | POA: Diagnosis not present

## 2023-08-30 DIAGNOSIS — B9789 Other viral agents as the cause of diseases classified elsewhere: Secondary | ICD-10-CM | POA: Diagnosis present

## 2023-08-30 DIAGNOSIS — J189 Pneumonia, unspecified organism: Principal | ICD-10-CM

## 2023-08-30 DIAGNOSIS — I5030 Unspecified diastolic (congestive) heart failure: Secondary | ICD-10-CM

## 2023-08-30 DIAGNOSIS — M810 Age-related osteoporosis without current pathological fracture: Secondary | ICD-10-CM | POA: Diagnosis present

## 2023-08-30 LAB — CBC WITH DIFFERENTIAL/PLATELET
Abs Immature Granulocytes: 0.95 10*3/uL — ABNORMAL HIGH (ref 0.00–0.07)
Basophils Absolute: 0.1 10*3/uL (ref 0.0–0.1)
Basophils Relative: 1 %
Eosinophils Absolute: 0 10*3/uL (ref 0.0–0.5)
Eosinophils Relative: 0 %
HCT: 34.4 % — ABNORMAL LOW (ref 39.0–52.0)
Hemoglobin: 10.8 g/dL — ABNORMAL LOW (ref 13.0–17.0)
Immature Granulocytes: 7 %
Lymphocytes Relative: 8 %
Lymphs Abs: 1.1 10*3/uL (ref 0.7–4.0)
MCH: 32.2 pg (ref 26.0–34.0)
MCHC: 31.4 g/dL (ref 30.0–36.0)
MCV: 102.7 fL — ABNORMAL HIGH (ref 80.0–100.0)
Monocytes Absolute: 0.6 10*3/uL (ref 0.1–1.0)
Monocytes Relative: 4 %
Neutro Abs: 10.8 10*3/uL — ABNORMAL HIGH (ref 1.7–7.7)
Neutrophils Relative %: 80 %
Platelets: 331 10*3/uL (ref 150–400)
RBC: 3.35 MIL/uL — ABNORMAL LOW (ref 4.22–5.81)
RDW: 14.8 % (ref 11.5–15.5)
WBC: 13.5 10*3/uL — ABNORMAL HIGH (ref 4.0–10.5)
nRBC: 0.2 % (ref 0.0–0.2)

## 2023-08-30 LAB — CBG MONITORING, ED
Glucose-Capillary: 199 mg/dL — ABNORMAL HIGH (ref 70–99)
Glucose-Capillary: 198 mg/dL — ABNORMAL HIGH (ref 70–99)

## 2023-08-30 LAB — STREP PNEUMONIAE URINARY ANTIGEN: Strep Pneumo Urinary Antigen: NEGATIVE

## 2023-08-30 LAB — RESPIRATORY PANEL BY PCR

## 2023-08-30 LAB — URINALYSIS, COMPLETE (UACMP) WITH MICROSCOPIC
Bilirubin Urine: NEGATIVE
Glucose, UA: NEGATIVE mg/dL
Ketones, ur: NEGATIVE mg/dL
Leukocytes,Ua: NEGATIVE
Nitrite: NEGATIVE
Protein, ur: NEGATIVE mg/dL
RBC / HPF: 0 RBC/hpf (ref 0–5)
Specific Gravity, Urine: 1.006 (ref 1.005–1.030)
Squamous Epithelial / HPF: 0 /[HPF] (ref 0–5)
pH: 5 (ref 5.0–8.0)

## 2023-08-30 LAB — COMPREHENSIVE METABOLIC PANEL
ALT: 403 U/L — ABNORMAL HIGH (ref 0–44)
AST: 257 U/L — ABNORMAL HIGH (ref 15–41)
Albumin: 3 g/dL — ABNORMAL LOW (ref 3.5–5.0)
Alkaline Phosphatase: 184 U/L — ABNORMAL HIGH (ref 38–126)
Anion gap: 16 — ABNORMAL HIGH (ref 5–15)
BUN: 94 mg/dL — ABNORMAL HIGH (ref 8–23)
CO2: 31 mmol/L (ref 22–32)
Calcium: 9.5 mg/dL (ref 8.9–10.3)
Chloride: 93 mmol/L — ABNORMAL LOW (ref 98–111)
Creatinine, Ser: 4.46 mg/dL — ABNORMAL HIGH (ref 0.61–1.24)
GFR, Estimated: 13 mL/min — ABNORMAL LOW (ref >60)
Glucose, Bld: 224 mg/dL — ABNORMAL HIGH (ref 70–99)
Potassium: 4.6 mmol/L (ref 3.5–5.1)
Sodium: 140 mmol/L (ref 135–145)
Total Bilirubin: 0.8 mg/dL (ref <1.2)
Total Protein: 7.1 g/dL (ref 6.5–8.1)

## 2023-08-30 LAB — RESP PANEL BY RT-PCR (RSV, FLU A&B, COVID)  RVPGX2
Influenza A by PCR: NEGATIVE
Influenza B by PCR: NEGATIVE
Resp Syncytial Virus by PCR: NEGATIVE
SARS Coronavirus 2 by RT PCR: NEGATIVE

## 2023-08-30 LAB — EXPECTORATED SPUTUM ASSESSMENT W GRAM STAIN, RFLX TO RESP C

## 2023-08-30 LAB — BASIC METABOLIC PANEL
Anion gap: 16 — ABNORMAL HIGH (ref 5–15)
BUN: 88 mg/dL — ABNORMAL HIGH (ref 8–23)
CO2: 28 mmol/L (ref 22–32)
Calcium: 9 mg/dL (ref 8.9–10.3)
Chloride: 96 mmol/L — ABNORMAL LOW (ref 98–111)
Creatinine, Ser: 4.31 mg/dL — ABNORMAL HIGH (ref 0.61–1.24)
GFR, Estimated: 13 mL/min — ABNORMAL LOW (ref >60)
Glucose, Bld: 208 mg/dL — ABNORMAL HIGH (ref 70–99)
Potassium: 4.5 mmol/L (ref 3.5–5.1)
Sodium: 140 mmol/L (ref 135–145)

## 2023-08-30 LAB — C-REACTIVE PROTEIN: CRP: 4.4 mg/dL — ABNORMAL HIGH (ref <1.0)

## 2023-08-30 LAB — PROTIME-INR
INR: 1.2 (ref 0.8–1.2)
Prothrombin Time: 15.1 s (ref 11.4–15.2)

## 2023-08-30 LAB — APTT: aPTT: 25 s (ref 24–36)

## 2023-08-30 LAB — TROPONIN I (HIGH SENSITIVITY)
Troponin I (High Sensitivity): 341 ng/L (ref <18)
Troponin I (High Sensitivity): 275 ng/L (ref <18)

## 2023-08-30 LAB — BRAIN NATRIURETIC PEPTIDE: B Natriuretic Peptide: 336.3 pg/mL — ABNORMAL HIGH (ref 0.0–100.0)

## 2023-08-30 LAB — LACTIC ACID, PLASMA
Lactic Acid, Venous: 3.4 mmol/L (ref 0.5–1.9)
Lactic Acid, Venous: 2.6 mmol/L (ref 0.5–1.9)

## 2023-08-30 LAB — SEDIMENTATION RATE: Sed Rate: 107 mm/h — ABNORMAL HIGH (ref 0–20)

## 2023-08-30 MED ORDER — SENNOSIDES-DOCUSATE SODIUM 8.6-50 MG PO TABS
1.0000 | ORAL_TABLET | Freq: Every day | ORAL | Status: DC
  Administered 2023-08-30 – 2023-09-05 (×6): 1 via ORAL
  Filled 2023-08-30 (×6): qty 1

## 2023-08-30 MED ORDER — ASPIRIN 81 MG PO CHEW
324.0000 mg | CHEWABLE_TABLET | Freq: Once | ORAL | Status: AC
Start: 2023-08-30 — End: 2023-08-30
  Administered 2023-08-30: 324 mg via ORAL
  Filled 2023-08-30: qty 4

## 2023-08-30 MED ORDER — VANCOMYCIN HCL IN DEXTROSE 1-5 GM/200ML-% IV SOLN
1000.0000 mg | Freq: Once | INTRAVENOUS | Status: AC
  Administered 2023-08-30: 1000 mg via INTRAVENOUS
  Filled 2023-08-30: qty 200

## 2023-08-30 MED ORDER — ONDANSETRON HCL 4 MG PO TABS: 4.0000 mg | ORAL_TABLET | Freq: Four times a day (QID) | ORAL | Status: DC | PRN

## 2023-08-30 MED ORDER — FAMOTIDINE 20 MG PO TABS
20.0000 mg | ORAL_TABLET | Freq: Every day | ORAL | Status: DC
  Administered 2023-08-30 – 2023-09-05 (×7): 20 mg via ORAL
  Filled 2023-08-30 (×7): qty 1

## 2023-08-30 MED ORDER — GABAPENTIN 100 MG PO CAPS
100.0000 mg | ORAL_CAPSULE | Freq: Every day | ORAL | Status: DC
Start: 2023-08-30 — End: 2023-09-06
  Administered 2023-08-30 – 2023-09-05 (×7): 100 mg via ORAL
  Filled 2023-08-30 (×7): qty 1

## 2023-08-30 MED ORDER — HEPARIN (PORCINE) 25000 UT/250ML-% IV SOLN
1000.0000 [IU]/h | INTRAVENOUS | Status: DC
  Administered 2023-08-30: 1200 [IU]/h via INTRAVENOUS
  Administered 2023-08-31: 1000 [IU]/h via INTRAVENOUS
  Filled 2023-08-30 (×2): qty 250

## 2023-08-30 MED ORDER — EZETIMIBE 10 MG PO TABS
10.0000 mg | ORAL_TABLET | Freq: Every day | ORAL | Status: DC
  Administered 2023-08-31 – 2023-09-06 (×7): 10 mg via ORAL
  Filled 2023-08-30 (×7): qty 1

## 2023-08-30 MED ORDER — HEPARIN BOLUS VIA INFUSION
4000.0000 [IU] | Freq: Once | INTRAVENOUS | Status: AC
  Administered 2023-08-30: 4000 [IU] via INTRAVENOUS
  Filled 2023-08-30: qty 4000

## 2023-08-30 MED ORDER — POLYETHYLENE GLYCOL 3350 17 G PO PACK: 17.0000 g | PACK | Freq: Every day | ORAL | Status: DC | PRN

## 2023-08-30 MED ORDER — ASPIRIN 81 MG PO TBEC: 81.0000 mg | DELAYED_RELEASE_TABLET | Freq: Every day | ORAL | Status: DC

## 2023-08-30 MED ORDER — NITROGLYCERIN 0.4 MG SL SUBL: 0.4000 mg | SUBLINGUAL_TABLET | SUBLINGUAL | Status: DC | PRN

## 2023-08-30 MED ORDER — LACTATED RINGERS IV BOLUS
500.0000 mL | Freq: Once | INTRAVENOUS | Status: AC
  Administered 2023-08-30: 500 mL via INTRAVENOUS

## 2023-08-30 MED ORDER — SODIUM CHLORIDE 0.9 % IV SOLN
2.0000 g | Freq: Once | INTRAVENOUS | Status: AC
  Administered 2023-08-30: 2 g via INTRAVENOUS
  Filled 2023-08-30: qty 12.5

## 2023-08-30 MED ORDER — INSULIN ASPART 100 UNIT/ML IJ SOLN
0.0000 [IU] | Freq: Three times a day (TID) | INTRAMUSCULAR | Status: DC
  Administered 2023-08-30 – 2023-08-31 (×2): 3 [IU] via SUBCUTANEOUS
  Administered 2023-08-31 (×2): 5 [IU] via SUBCUTANEOUS
  Administered 2023-09-01: 3 [IU] via SUBCUTANEOUS
  Administered 2023-09-01: 8 [IU] via SUBCUTANEOUS
  Administered 2023-09-01: 5 [IU] via SUBCUTANEOUS
  Administered 2023-09-02: 3 [IU] via SUBCUTANEOUS
  Administered 2023-09-02: 15 [IU] via SUBCUTANEOUS
  Administered 2023-09-02: 11 [IU] via SUBCUTANEOUS
  Administered 2023-09-03: 2 [IU] via SUBCUTANEOUS
  Administered 2023-09-03 (×2): 3 [IU] via SUBCUTANEOUS
  Administered 2023-09-04: 2 [IU] via SUBCUTANEOUS
  Administered 2023-09-04: 3 [IU] via SUBCUTANEOUS
  Administered 2023-09-04: 2 [IU] via SUBCUTANEOUS
  Administered 2023-09-05: 5 [IU] via SUBCUTANEOUS
  Administered 2023-09-05 (×2): 3 [IU] via SUBCUTANEOUS
  Administered 2023-09-06: 5 [IU] via SUBCUTANEOUS
  Filled 2023-08-30 (×20): qty 1

## 2023-08-30 MED ORDER — SODIUM CHLORIDE 0.9% FLUSH
3.0000 mL | Freq: Two times a day (BID) | INTRAVENOUS | Status: DC
  Administered 2023-08-30 – 2023-09-06 (×12): 3 mL via INTRAVENOUS

## 2023-08-30 MED ORDER — IPRATROPIUM-ALBUTEROL 0.5-2.5 (3) MG/3ML IN SOLN
3.0000 mL | Freq: Four times a day (QID) | RESPIRATORY_TRACT | Status: DC
  Administered 2023-08-30 – 2023-09-01 (×8): 3 mL via RESPIRATORY_TRACT
  Filled 2023-08-30 (×8): qty 3

## 2023-08-30 MED ORDER — ACETAMINOPHEN 650 MG RE SUPP: 650.0000 mg | Freq: Four times a day (QID) | RECTAL | Status: DC | PRN

## 2023-08-30 MED ORDER — LACTATED RINGERS IV SOLN: INTRAVENOUS | Status: DC

## 2023-08-30 MED ORDER — ONDANSETRON HCL 4 MG/2ML IJ SOLN: 4.0000 mg | Freq: Four times a day (QID) | INTRAMUSCULAR | Status: DC | PRN

## 2023-08-30 MED ORDER — ASPIRIN 81 MG PO TBEC
81.0000 mg | DELAYED_RELEASE_TABLET | Freq: Every day | ORAL | Status: DC
  Administered 2023-08-31 – 2023-09-06 (×7): 81 mg via ORAL
  Filled 2023-08-30 (×7): qty 1

## 2023-08-30 MED ORDER — VITAMIN B-12 1000 MCG PO TABS
500.0000 ug | ORAL_TABLET | Freq: Every day | ORAL | Status: DC
  Administered 2023-08-31 – 2023-09-06 (×7): 500 ug via ORAL
  Filled 2023-08-30 (×7): qty 1

## 2023-08-30 MED ORDER — ACETAMINOPHEN 325 MG PO TABS: 650.0000 mg | ORAL_TABLET | Freq: Four times a day (QID) | ORAL | Status: DC | PRN

## 2023-08-30 MED ORDER — PREDNISONE 10 MG PO TABS
15.0000 mg | ORAL_TABLET | Freq: Once | ORAL | Status: AC
Start: 2023-08-30 — End: 2023-08-30
  Administered 2023-08-30: 15 mg via ORAL
  Filled 2023-08-30: qty 2

## 2023-08-30 NOTE — H&P (Addendum)
History and Physical    Patient: Kurt Aguilar LKG:[id number] DOB: 10/06/1945 DOA: 08/30/2023 DOS: the patient was seen and examined on 08/30/2023 PCP: Judy Pimple, MD  Patient coming from: SNF  Chief Complaint:  Chief Complaint  Patient presents with   Shortness of Breath   HPI: Kurt Aguilar is a 77 y.o. 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  Kurt Aguilar was recently admitted from 12/12 to 12/20 for acute hypoxic respiratory failure in the setting of tracheobronchomalacia and suspected LLL pneumonia + AKI on CKD 2/2 contrast exposure.  Kurt Aguilar was discharged to SNF on 12/20.  During hospitalization, pulmonology and nephrology were consulted.  During hospitalization, Kurt Aguilar received 5 days of meropenem and azithromycin, with an additional 2 days of Rocephin to complete a 7-day course.  Kurt Aguilar received IV fluids prior to resumption of torsemide on discharge.  At this time, Kurt Aguilar states that his breathing never really improved since his prior discharge and Kurt Aguilar feels that his breathing is about and where it has been.  Kurt Aguilar endorses continued copious sputum production with cough.  Kurt Aguilar states that his sputum is usually clear/green, but occasionally turns brown/black and occasionally red.  Kurt Aguilar denies any known fever, chills, nausea, vomiting.  Kurt Aguilar denies any lower extremity swelling that is changed compared to his baseline.  Kurt Aguilar notes that yesterday, Kurt Aguilar did have acute onset substernal chest pain when Kurt Aguilar was resting in bed that self resolved but it was severe enough to make him a bit concerned.  When Kurt Aguilar awoke today, Kurt Aguilar was that Kurt Aguilar was very disorientated and that is why ambulance was called.  ED course: On arrival to the ED, patient was normotensive at 127/80 with  heart rate of 81.  Kurt Aguilar was saturating at 97% on 4 L.  Kurt Aguilar was afebrile at 97.8.  Initial workup notable for WBC of 13.5, hemoglobin of 10.8, glucose 224, BUN 94, creatinine 4.46, alkaline phosphatase 184, albumin 3.0, AST 257, ALT 43 and GFR 14.  Troponin 341 with BNP of 346.  Pulmonology consulted.  Patient started on aspirin, cefepime, LR, and vancomycin.  TRH contacted for admission.  Review of Systems: As mentioned in the history of present illness. All other systems reviewed and are negative.  Past Medical History:  Diagnosis Date   Arthritis    Chronic kidney disease    Complication of anesthesia    constipation and inability to  urinate happened a few days after cataract surgery    Diabetes (HCC)    type 2    Diverticulitis    GERD (gastroesophageal reflux disease)    Gout    Heart attack (HCC)    Heart disease    Heart murmur    Hyperglycemia    Hyperlipidemia    Hypertension    Osteoporosis    Pneumonia    hx of BOOP followed by Dr Sherene Sires    Sleep apnea    cpapp - setting at 17    Past Surgical History:  Procedure Laterality Date   ACROMIO-CLAVICULAR JOINT REPAIR Left 05/05/2019   Procedure: SHOULDER ARTHROSCOPIC ASSISTED ACROMIO-CLAVICULAR JOINT REPAIR;  Surgeon: Jones Broom, MD;  Location: [facility];  Service: Orthopedics;  Laterality: Left;   CATARACT EXTRACTION Bilateral    CORONARY ARTERY BYPASS GRAFT     [place]  LEFT HEART CATH AND CORS/GRAFTS ANGIOGRAPHY N/A 09/19/2021   Procedure: LEFT HEART CATH AND CORS/GRAFTS ANGIOGRAPHY;  Surgeon: Lyn Records, MD;  Location: [facility];  Service: Cardiovascular;  Laterality: N/A;   open heart surgery  09/09/1995-1998   5 bypass   REPAIR KNEE LIGAMENT     right   SHOULDER ARTHROSCOPY Left 05/05/2019   Procedure: ARTHROSCOPY SHOULDER;  Surgeon: Jones Broom, MD;  Location: [facility];  Service: Orthopedics;  Laterality: Left;   TONSILLECTOMY     TOTAL KNEE ARTHROPLASTY Left 05/29/2020   Procedure: LEFT  TOTAL KNEE ARTHROPLASTY;  Surgeon: Marcene Corning, MD;  Location: [facility];  Service: Orthopedics;  Laterality: Left;   Social History:  reports that Kurt Aguilar quit smoking about 18 years ago. His smoking use included cigars and cigarettes. Kurt Aguilar started smoking about 21 years ago. Kurt Aguilar has a 3 pack-year smoking history. Kurt Aguilar has never used smokeless tobacco. Kurt Aguilar reports current alcohol use of about 1.0 standard drink of alcohol per week. Kurt Aguilar reports that Kurt Aguilar does not use drugs.  Allergies  Allergen Reactions   Oxycodone-Acetaminophen Other (See Comments)    Stops breathing; has tolerated Tylenol before   Oxycodone-Acetaminophen Anaphylaxis    Family History  Problem Relation Age of Onset   Heart disease Mother    Heart disease Father    Stroke Father    Heart attack Son 25   Kidney disease Neg Hx    Prostate cancer Neg Hx    Diabetes Neg Hx     Prior to Admission medications   Medication Sig Start Date End Date Taking? Authorizing Provider  albuterol (PROVENTIL) (2.5 MG/3ML) 0.083% nebulizer solution Take 3 mLs (2.5 mg total) by nebulization 2 (two) times daily. 08/28/23   Marcelino Duster, MD  allopurinol (ZYLOPRIM) 100 MG tablet TAKE 1 TABLET BY MOUTH TWICE A DAY 06/08/23   Tower, Audrie Gallus, MD  aspirin EC (ASPIRIN 81) 81 MG tablet Take 81 mg by mouth daily. Swallow whole.    [provider]  benzonatate (TESSALON) 200 MG capsule Take 1 capsule (200 mg total) by mouth 3 (three) times daily. 08/28/23   Marcelino Duster, MD  bisoprolol (ZEBETA) 5 MG tablet Take 0.5 tablets (2.5 mg total) by mouth daily. 08/29/23   Marcelino Duster, MD  Calcium Carbonate-Vit D-Min (CALCIUM-VITAMIN D-MINERALS) 600-800 MG-UNIT CHEW Chew 1 tablet by mouth daily.    [provider]  colchicine 0.6 MG tablet Take 1 tablet (0.6 mg total) by mouth as needed (GOUT FLARE). TAKE 1 TABLET BY MOUTH TWICE A DAY WITH A MEAL AS NEEDED FOR ACUTE FLARE OF GOUT 04/22/23   Tower, Audrie Gallus, MD  Continuous Blood  Gluc Receiver (FREESTYLE LIBRE 2 READER) DEVI USE AS INSTRUCTED TO CHECK BLOOD SUGARS. 01/13/22   Romero Belling, MD  Continuous Blood Gluc Sensor (FREESTYLE LIBRE 2 SENSOR) MISC 1 Device by Does not apply route every 14 (fourteen) days. 10/31/22   Carlus Pavlov, MD  cyanocobalamin (VITAMIN B12) 500 MCG tablet Take 500 mcg by mouth daily.    [provider]  ezetimibe (ZETIA) 10 MG tablet TAKE 1 TABLET BY MOUTH EVERY DAY 07/08/23   Tower, Audrie Gallus, MD  famotidine (PEPCID) 20 MG tablet TAKE 1 TABLET BY MOUTH EVERYDAY AT BEDTIME 04/10/23   Nyoka Cowden, MD  gabapentin (NEURONTIN) 100 MG capsule Take 300 mg by mouth daily. 01/04/23   [provider]  Glucosamine HCl (GLUCOSAMINE PO) Take 1,500 mg by mouth 2 (two) times daily.  [provider]  glucose blood (FREESTYLE LITE) test strip 1 each by Other route 4 (four) times daily. E11.65 04/10/20   Romero Belling, MD  insulin isophane & regular human KwikPen (NOVOLIN 70/30 KWIKPEN) (70-30) 100 UNIT/ML KwikPen Inject 30 Units into the skin daily with breakfast. And pen needles 1/day 05/08/23   Carlus Pavlov, MD  insulin lispro (HUMALOG KWIKPEN) 200 UNIT/ML KwikPen Inject 10-14 Units into the skin 2 (two) times daily before a meal. INJECT 12-15 UNITS INTO THE SKIN 2 (TWO) TIMES DAILY BEFORE A MEAL. 05/08/23   Carlus Pavlov, MD  Insulin Pen Needle (BD PEN NEEDLE NANO 2ND GEN) 32G X 4 MM MISC USE 3 TIMES A DAY 01/25/23   Carlus Pavlov, MD  ketoconazole (NIZORAL) 2 % cream Apply 1 Application topically 2 (two) times daily. 11/10/22   [provider]  Lancets (FREESTYLE) lancets 1 EACH BY OTHER ROUTE 4 (FOUR) TIMES DAILY. E11.65 08/23/20   Romero Belling, MD  Lutein 20 MG CAPS Take 20 mg by mouth daily.    [provider]  meclizine (ANTIVERT) 12.5 MG tablet TAKE 1-2 TABLETS BY MOUTH THREE TIMES A DAY AS NEEDED FOR DIZZINESS 10/30/22   [provider]  Multiple Vitamins-Minerals (PRESERVISION AREDS 2 PO)  Take by mouth.    [provider]  nitroGLYCERIN (NITROSTAT) 0.4 MG SL tablet Place 1 tablet (0.4 mg total) under the tongue every 5 (five) minutes x 3 doses as needed for chest pain. 01/19/23   Antonieta Iba, MD  omeprazole (PRILOSEC) 40 MG capsule TAKE 1 CAPUSLE BY MOUTH 30- 60 MIN BEFORE YOUR FIRST AND LAST MEALS OF THE DAY 10/31/22   Nyoka Cowden, MD  ondansetron (ZOFRAN) 8 MG tablet Take 1 tablet (8 mg total) by mouth every 8 (eight) hours as needed for nausea or vomiting. From narcotic pain medicine 01/21/23   Tower, Audrie Gallus, MD  polyethylene glycol (MIRALAX / GLYCOLAX) 17 g packet Take 17 g by mouth daily.    [provider]  predniSONE (DELTASONE) 5 MG tablet Take 3 tablets (15 mg total) by mouth daily with breakfast for 14 days, THEN 2 tablets (10 mg total) daily with breakfast for 14 days, THEN 1 tablet (5 mg total) daily with breakfast for 14 days. 3 daily. 08/28/23 10/09/23  Marcelino Duster, MD  rosuvastatin (CRESTOR) 40 MG tablet TAKE 1 TABLET BY MOUTH EVERY DAY 11/18/22   Tower, Audrie Gallus, MD  Semaglutide, 2 MG/DOSE, (OZEMPIC, 2 MG/DOSE,) 8 MG/3ML SOPN Inject 2 mg into the skin once a week. 05/08/23   Carlus Pavlov, MD  senna-docusate (SENOKOT-S) 8.6-50 MG tablet Take 1 tablet by mouth at bedtime. 08/28/23   Marcelino Duster, MD  torsemide 40 MG TABS Take 40 mg by mouth daily. 08/29/23   Marcelino Duster, MD    Physical Exam: Vitals:   08/30/23 1500 08/30/23 1530 08/30/23 1600 08/30/23 1630  BP: (!) 146/87 (!) 143/94 133/81 (!) 134/91  Pulse: 82 83 88 83  Resp: 20 18 19  (!) 30  Temp:    98.2 F (36.8 C)  TempSrc:    Oral  SpO2: 100% 100% 100% 100%   Physical Exam Vitals and nursing note reviewed.  Constitutional:      General: Kurt Aguilar is not in acute distress.    Appearance: Kurt Aguilar is obese. Kurt Aguilar is ill-appearing. Kurt Aguilar is not diaphoretic.  HENT:     Head: Normocephalic and atraumatic.     Mouth/Throat:     Mouth: Mucous membranes are moist.  Pharynx: Oropharynx is clear.  Neck:     Comments: JVD difficult to assess due to body habitus. Moon facies in the setting of chronic steroid use Cardiovascular:     Rate and Rhythm: Normal rate and regular rhythm.     Heart sounds: Murmur heard.  Pulmonary:     Effort: Tachypnea present. No accessory muscle usage or respiratory distress.     Breath sounds: Decreased breath sounds (Bibasilar) and rhonchi (Diffuse) present.  Abdominal:     General: Abdomen is protuberant. Bowel sounds are normal.     Palpations: Abdomen is soft.     Tenderness: There is no abdominal tenderness. There is no guarding.  Musculoskeletal:     Right lower leg: No edema.     Left lower leg: No edema.  Skin:    General: Skin is warm and dry.     Findings: Ecchymosis (Throughout) present.  Neurological:     General: No focal deficit present.     Mental Status: Kurt Aguilar is alert and oriented to person, place, and time.  Psychiatric:        Mood and Affect: Mood normal.        Behavior: Behavior normal.    Data Reviewed: CBC with WBC of 13.5, hemoglobin of 10.8, MCV 102.7, platelets of 331 CMP with sodium of 140, potassium 4.6, bicarb 31, glucose 224, BUN 94, creatinine 4.46, anion gap 16, alkaline phosphatase 184, albumin 3.0, AST 257, ALT 403 and GFR 13 BNP 336 Troponin 341 Lactic acid 3.4 with downtrend to 2.6 INR 1.2 COVID-19, RSV and influenza PCR negative Urinalysis with small hematuria, rare bacteria only  EKG pending  CT CHEST WO CONTRAST Result Date: 08/30/2023 CLINICAL DATA:  Recent history of pneumonia/sepsis. Productive cough. EXAM: CT CHEST WITHOUT CONTRAST TECHNIQUE: Multidetector CT imaging of the chest was performed following the standard protocol without IV contrast. RADIATION DOSE REDUCTION: This exam was performed according to the departmental dose-optimization program which includes automated exposure control, adjustment of the mA and/or kV according to patient size and/or use of iterative  reconstruction technique. COMPARISON:  CT chest dated August 22, 2023. FINDINGS: Cardiovascular: Normal heart size. No pericardial effusion. Atherosclerosis of the thoracic aorta and arch branch vessels. Multivessel coronary artery calcifications. Aortic valve and mitral annulus calcifications. Prior CABG. Mediastinum/Nodes: Redemonstration of multiple prominent mediastinal lymph nodes, similar to slightly decreased in size compared to the prior exam. Calcified mediastinal and right hilar granulomatous nodes. Secretions noted in the left mainstem bronchus. Thyroid gland and esophagus demonstrate no significant abnormality. Lungs/Pleura: Similar small right and trace left pleural effusions. Calcified pleural plaques in the left hemithorax. Patchy bilateral multifocal peripheral predominant consolidation is again noted with increased consolidative changes at the right lung base and the superior aspect of the posterior left lower lobe. No pneumothorax. Upper Abdomen: No acute abnormality. Musculoskeletal: Intact median sternotomy wires. No suspicious osseous lesion. No acute osseous abnormality. IMPRESSION: 1. Multilobar pneumonia with increased airspace consolidation at the right lung base and the posterior left lower lobe. 2. Similar small right and trace left pleural effusions. 3. Additional unchanged ancillary findings, as described above. Aortic Atherosclerosis (ICD10-I70.0). Electronically Signed   By: Hart Robinsons M.D.   On: 08/30/2023 17:03   DG Chest 2 View Result Date: 08/30/2023 CLINICAL DATA:  Cough and dyspnea. Recent hospitalization for pneumonia/sepsis. EXAM: CHEST - 2 VIEW COMPARISON:  08/22/2023 FINDINGS: Lungs are hypoinflated demonstrate persistent opacification over the left base likely infection with associated small left pleural effusion. Possible small amount right  pleural fluid. Calcified pleural plaques over the lateral left upper lung/apex. Mild stable cardiomegaly. Remainder of the  exam is unchanged. IMPRESSION: 1. Persistent opacification over the left base likely infection with associated small left pleural effusion. Possible small amount right pleural fluid. 2. Stable cardiomegaly. Electronically Signed   By: Elberta Fortis M.D.   On: 08/30/2023 13:43   Results are pending, will review when available.  Assessment and Plan:  * Acute hypoxic respiratory failure (HCC) Patient is presenting with AMS this morning in the setting of hypoxia that resolved on placement of supplemental oxygen.  Kurt Aguilar was recently admitted for pneumonia and received 5 days of meropenem and azithromycin with additional 2 days of Rocephin.  Pulmonology has been consulted and recommending treatment for potential hospital associated pneumonia.  Hypoxia may also be related to underlying NSTEMI/HF given slightly enlarged pleural effusions in addition to ongoing tracheobronchomalacia.  - Pulmonology consulted; appreciate their recommendations - Continue supplemental oxygen to maintain oxygen saturation above 88% - Wean as tolerated - S/p one-time dose of vancomycin; will hold off on further vanc given MRSA PCR negative - Cefepime per pharmacy dosing - Expectorated sputum culture - Legionella and strep pneumo urinary antigens - Full respiratory viral panel - VQ scan ordered - CT of the chest pending - Will follow-up additional recommendations per pulmonology note  NSTEMI (non-ST elevated myocardial infarction) [facility]) Patient reported a one-time episode of chest pain yesterday that occurred at rest with evidence of elevated troponin at 341.  Per chart review, patient had a similar elevation in troponin in January 2023.  At that time, Kurt Aguilar underwent LHC with suspicion that NSTEMI was due to left coronary native disease and septal perforator territory.  Given his significant AKI, unlikely to be a candidate for LHC again.  - Cardiology consulted; appreciate their recommendations - Start medical management with  heparin infusion per pharmacy dosing - S/p aspirin 324 mg once, start aspirin 81 mg daily tomorrow - Echocardiogram ordered  Tracheobronchomalacia Per chart review, patient has a previous history of BOOP with evaluation by pulmonology on last admission noting clinically and radiologically more consistent with tracheobronchomalacia.  - Pulmonology consulted; appreciate their recommendations - Management of acute hypoxic respiratory failure as noted above  (HFpEF) heart failure with preserved ejection fraction (HCC) Although patient does not appear significantly hypervolemic on examination, Kurt Aguilar is noted to have slightly increased pleural effusions on CT imaging and his BNP is increased compared to prior at 336.  Given AKI, Kurt Aguilar received small IV bolus of fluids.  Will hold off on diuretics at this time and recheck renal function in the evening.  If evidence of worsening renal function, will trial a dose of Lasix, given the possibility this may be cardiorenal in nature.  - Hold off on diuretics at this time - Daily weights - Strict in and out - Echocardiogram ordered  Acute kidney injury superimposed on chronic kidney disease (HCC) Patient has a history of CKD stage IV With AKI on most recent admission.  On discharge, his creatinine was noted to be slowly rising as high as 3.73, currently 4.46.  At that time, there was concern for contrast-induced nephropathy.  Differential at this point is broad.  Patient does not appear particularly dry however in light of elevated LFTs, potential ATN if Kurt Aguilar did have a hypotensive episode with hypoxia.  Differential also includes cardiorenal.  - Nephrology consulted; appreciate their recommendations - Renal ultrasound ordered - Repeat BMP this afternoon - Avoid nephrotoxic agents as able  Elevated  LFTs Unclear etiology at this time.  No prior history of cirrhosis, and INR and platelets are within normal limits.  Kurt Aguilar is not experiencing any abdominal pain to  suggest cholecystitis.  No reported alcohol use.  I wonder if Kurt Aguilar may have had hypotension when Kurt Aguilar was hypoxic and altered earlier today.  - Recheck CMP in the a.m. - Right upper quadrant ultrasound ordered - Hold home statin  Steroid dependent (HCC) - Continue home steroids  Type 2 diabetes mellitus (HCC) - Hold home antiglycemic agents - SSI, moderate - Decrease home gabapentin at bedtime from 300 mg to 100 mg given AKI  Essential hypertension - Hold home antihypertensives in the setting of high risk for hypotension.  Obstructive sleep apnea - CPAP at bedtime  Advance Care Planning:   Code Status: Limited: Do not attempt resuscitation (DNR) -DNR-LIMITED -Do Not Intubate/DNI confirmed by patient with his wife at bedside.  Consults: Pulmonology, cardiology, nephrology  Family Communication: Patient's wife updated at bedside  Severity of Illness: The appropriate patient status for this patient is INPATIENT. Inpatient status is judged to be reasonable and necessary in order to provide the required intensity of service to ensure the patient's safety. The patient's presenting symptoms, physical exam findings, and initial radiographic and laboratory data in the context of their chronic comorbidities is felt to place them at high risk for further clinical deterioration. Furthermore, it is not anticipated that the patient will be medically stable for discharge from the hospital within 2 midnights of admission.   * I certify that at the point of admission it is my clinical judgment that the patient will require inpatient hospital care spanning beyond 2 midnights from the point of admission due to high intensity of service, high risk for further deterioration and high frequency of surveillance required.*  Author: Verdene Lennert, MD 08/30/2023 7:35 PM  For on call review [email or web address].

## 2023-08-30 NOTE — Assessment & Plan Note (Signed)
Patient has a history of CKD stage IV With AKI on most recent admission.  On discharge, his creatinine was noted to be slowly rising as high as 3.73, currently 4.46.  At that time, there was concern for contrast-induced nephropathy.  Differential at this point is broad.  Patient does not appear particularly dry however in light of elevated LFTs, potential ATN if he did have a hypotensive episode with hypoxia.  Differential also includes cardiorenal.  - Nephrology consulted; appreciate their recommendations - Renal ultrasound ordered - Repeat BMP this afternoon - Avoid nephrotoxic agents as able

## 2023-08-30 NOTE — Assessment & Plan Note (Signed)
Per chart review, patient has a previous history of BOOP with evaluation by pulmonology on last admission noting clinically and radiologically more consistent with tracheobronchomalacia.  - Pulmonology consulted; appreciate their recommendations - Management of acute hypoxic respiratory failure as noted above

## 2023-08-30 NOTE — Assessment & Plan Note (Signed)
 CPAP at bedtime

## 2023-08-30 NOTE — Assessment & Plan Note (Signed)
-   Hold home antihypertensives in the setting of high risk for hypotension.

## 2023-08-30 NOTE — Assessment & Plan Note (Signed)
Patient reported a one-time episode of chest pain yesterday that occurred at rest with evidence of elevated troponin at 341.  Per chart review, patient had a similar elevation in troponin in January 2023.  At that time, he underwent LHC with suspicion that NSTEMI was due to left coronary native disease and septal perforator territory.  Given his significant AKI, unlikely to be a candidate for LHC again.  - Cardiology consulted; appreciate their recommendations - Start medical management with heparin infusion per pharmacy dosing - S/p aspirin 324 mg once, start aspirin 81 mg daily tomorrow - Echocardiogram ordered

## 2023-08-30 NOTE — ED Notes (Signed)
This RN placed IV consult due to pt being difficult IV start and needs second IV for IV heparin.

## 2023-08-30 NOTE — Consult Note (Signed)
Pharmacy Antibiotic Note  Kurt Aguilar is a 77 y.o. male admitted on 08/30/2023 with  HCAP .  Pharmacy has been consulted for Cefepime dosing. PMH includes CAD status post CABG in 1997, hypertension, insulin-dependent diabetes mellitus, CKD stage IV, hyperlipidemia, gout, reactive airway symptoms, cryptogenic organizing pneumonia, on chronic daily steroid, polycystic kidney disease, GERD. Patient discharged 08/28/2023 from [facility] after 8 days admission for acute resp failure and lower lobe pneumonia.  Plan: Cefepime 2gm q 24hrs to start 12/23 @ 1500. Patient already received cefepime and vancomycin x 1 as part of code sepsis.      Temp (24hrs), Avg:98 F (36.7 C), Min:97.8 F (36.6 C), Max:98.2 F (36.8 C)  Recent Labs  Lab 08/25/23 0442 08/26/23 0711 08/26/23 0825 08/27/23 0936 08/28/23 0455 08/30/23 1328 08/30/23 1547  WBC 12.1*  --  12.3*  --  7.2 13.5*  --   CREATININE 3.04* 3.52*  --  3.64* 3.73* 4.46*  --   LATICACIDVEN  --   --   --   --   --  3.4* 2.6*    Estimated Creatinine Clearance: 17.2 mL/min (A) (by C-G formula based on SCr of 4.46 mg/dL (H)).    Allergies  Allergen Reactions   Oxycodone-Acetaminophen Other (See Comments)    Stops breathing; has tolerated Tylenol before   Oxycodone-Acetaminophen Anaphylaxis    Antimicrobials this admission: Cefepime 12/22 >>    Dose adjustments this admission:  Microbiology results:   Thank you for allowing pharmacy to be a part of this patient's care.  Cathaleen Korol Rodriguez-Guzman PharmD, BCPS 08/30/2023 5:37 PM

## 2023-08-30 NOTE — Consult Note (Signed)
ED Pharmacy Antibiotic Sign Off An antibiotic consult was received from an ED provider for Vancomycin and cefepime per pharmacy dosing for sepsis. A chart review was completed to assess appropriateness.   The following one time order(s) were placed:  Cefepime 2gm  Vancomycin 1000mg   Further antibiotic and/or antibiotic pharmacy consults should be ordered by the admitting provider if indicated.   Thank you for allowing pharmacy to be a part of this patient's care.   Kurt Aguilar PharmD, BCPS 08/30/2023 4:20 PM

## 2023-08-30 NOTE — ED Notes (Signed)
This RN to bedside to introduce self to pt. Pt is resting comfortable with wife at bedside. Pt advised he was just here last week and was Dc'd on Friday and is back for the same thing.

## 2023-08-30 NOTE — ED Notes (Signed)
Pt and wife made aware we would be starting IV heparin when the IV team nurse comes down and started second IV. Wife advised he was on heparin last time and she is concerned as his blood was too thin last time and it was leaking around his IV site. We will keep a close eye on this today in the ER.

## 2023-08-30 NOTE — Consult Note (Signed)
CODE SEPSIS - PHARMACY COMMUNICATION  **Broad Spectrum Antibiotics should be administered within 1 hour of Sepsis diagnosis**  Time Code Sepsis Called/Page Received: 1530  Antibiotics Ordered: cefepime and vancomycin  Time of 1st antibiotic administration: 1544  Additional action taken by pharmacy: none  If necessary, Name of Provider/Nurse Contacted: n/a    Gemma Ruan Rodriguez-Guzman PharmD, BCPS 08/30/2023 4:40 PM

## 2023-08-30 NOTE — Assessment & Plan Note (Addendum)
Patient is presenting with AMS this morning in the setting of hypoxia that resolved on placement of supplemental oxygen.  He was recently admitted for pneumonia and received 5 days of meropenem and azithromycin with additional 2 days of Rocephin.  Pulmonology has been consulted and recommending treatment for potential hospital associated pneumonia.  Hypoxia may also be related to underlying NSTEMI/HF given slightly enlarged pleural effusions in addition to ongoing tracheobronchomalacia.  - Pulmonology consulted; appreciate their recommendations - Continue supplemental oxygen to maintain oxygen saturation above 88% - Wean as tolerated - S/p one-time dose of vancomycin; will hold off on further vanc given MRSA PCR negative - Cefepime per pharmacy dosing - Expectorated sputum culture - Legionella and strep pneumo urinary antigens - Full respiratory viral panel - VQ scan ordered - CT of the chest pending - Will follow-up additional recommendations per pulmonology note

## 2023-08-30 NOTE — Assessment & Plan Note (Signed)
Although patient does not appear significantly hypervolemic on examination, he is noted to have slightly increased pleural effusions on CT imaging and his BNP is increased compared to prior at 336.  Given AKI, he received small IV bolus of fluids.  Will hold off on diuretics at this time and recheck renal function in the evening.  If evidence of worsening renal function, will trial a dose of Lasix, given the possibility this may be cardiorenal in nature.  - Hold off on diuretics at this time - Daily weights - Strict in and out - Echocardiogram ordered

## 2023-08-30 NOTE — ED Provider Notes (Signed)
[facility] Provider Note   Event Date/Time   First MD Initiated Contact with Patient 08/30/23 1257     (approximate)  History   Shortness of Breath  HPI  Kurt Aguilar is a 77 y.o. male with a history of idiopathic pulmonary disease, hypoxemic respiratory failure" pulmonary infiltrates" with severe sepsis on review of note from December 20.  Treated for tracheomalacia. Baseline cr 2.2 per nephrology   Patient reports shortness of breath returning again over the last day.  He was recently treated for "Boop" and was in the hospital for several days  Today he started feeling short of breath again.  He reports he has had the symptoms many times he will get an infection he gets treated he gets better than he is to get treated again keeps going over and over again that he has problems with his bronchial tubes  No fever.  Reports fairly productive cough for the last day and a half  No chest pain no pain or discomfort anywhere.  The shortness of breath is alleviated now that he is on oxygen  He has not noticed any leg swelling or calf pain.  Physical Exam   Triage Vital Signs: ED Triage Vitals  Encounter Vitals Group     BP 08/30/23 1240 127/80     Systolic BP Percentile --      Diastolic BP Percentile --      Pulse Rate 08/30/23 1240 84     Resp 08/30/23 1240 (!) 22     Temp 08/30/23 1240 97.8 F (36.6 C)     Temp Source 08/30/23 1240 Oral     SpO2 08/30/23 1238 (!) 88 %     Weight --      Height --      Head Circumference --      Peak Flow --      Pain Score 08/30/23 1242 0     Pain Loc --      Pain Education --      Exclude from Growth Chart --     Most recent vital signs: Vitals:   08/30/23 1238 08/30/23 1240  BP:  127/80  Pulse:  84  Resp:  (!) 22  Temp:  97.8 F (36.6 C)  SpO2: (!) 88% 97%     General: Awake, no distress.  Respirating comfortably fully alert.  Denies symptoms at this time reports feeling better on  oxygen CV:  Good peripheral perfusion.  Normal tones and rate Resp:  Normal effort.  Diminished lung sounds, slight dry crackles noted.  No wheezing and his work of breathing appears quite normal at this time.  No accessory muscle use.  Currently on 4 L nasal cannula Abd:  No distention.  Slightly distended but soft and nontender.  Bruising over the lower abdomen, patient reports no pain or discomfort advises that occurred after injections in the hospital recently.  There is no large hematoma.  He has some venous congestion running across the veins of the abdomen Other:  Mild bilateral lower extremity edema pitting.  No calf tenderness or unilateral edema   ED Results / Procedures / Treatments   Labs (all labs ordered are listed, but only abnormal results are displayed) Labs Reviewed  LACTIC ACID, PLASMA - Abnormal; Notable for the following components:      Result Value   Lactic Acid, Venous 3.4 (*)    All other components within normal limits  COMPREHENSIVE METABOLIC PANEL - Abnormal; Notable for  the following components:   Chloride 93 (*)    Glucose, Bld 224 (*)    BUN 94 (*)    Creatinine, Ser 4.46 (*)    Albumin 3.0 (*)    AST 257 (*)    ALT 403 (*)    Alkaline Phosphatase 184 (*)    GFR, Estimated 13 (*)    Anion gap 16 (*)    All other components within normal limits  CBC WITH DIFFERENTIAL/PLATELET - Abnormal; Notable for the following components:   WBC 13.5 (*)    RBC 3.35 (*)    Hemoglobin 10.8 (*)    HCT 34.4 (*)    MCV 102.7 (*)    Neutro Abs 10.8 (*)    Abs Immature Granulocytes 0.95 (*)    All other components within normal limits  BRAIN NATRIURETIC PEPTIDE - Abnormal; Notable for the following components:   B Natriuretic Peptide 336.3 (*)    All other components within normal limits  TROPONIN I (HIGH SENSITIVITY) - Abnormal; Notable for the following components:   Troponin I (High Sensitivity) 341 (*)    All other components within normal limits  RESP PANEL BY  RT-PCR (RSV, FLU A&B, COVID)  RVPGX2  CULTURE, BLOOD (ROUTINE X 2)  CULTURE, BLOOD (ROUTINE X 2)  RESPIRATORY PANEL BY PCR  PROTIME-INR  APTT  LACTIC ACID, PLASMA     EKG  Reviewed inter by me at 1440 heart rate 80 QRS 1 and 40 QTc 500 Normal sinus rhythm, right bundle branch block.  No evidence of acute ischemia   RADIOLOGY  Chest x-ray inter by me is concerning for left lower lobe consolidation and pleural effusion  DG Chest 2 View Result Date: 08/30/2023 CLINICAL DATA:  Cough and dyspnea. Recent hospitalization for pneumonia/sepsis. EXAM: CHEST - 2 VIEW COMPARISON:  08/22/2023 FINDINGS: Lungs are hypoinflated demonstrate persistent opacification over the left base likely infection with associated small left pleural effusion. Possible small amount right pleural fluid. Calcified pleural plaques over the lateral left upper lung/apex. Mild stable cardiomegaly. Remainder of the exam is unchanged. IMPRESSION: 1. Persistent opacification over the left base likely infection with associated small left pleural effusion. Possible small amount right pleural fluid. 2. Stable cardiomegaly. Electronically Signed   By: Elberta Fortis M.D.   On: 08/30/2023 13:43      PROCEDURES:  Critical Care performed: No  Procedures   MEDICATIONS ORDERED IN ED: Medications  aspirin chewable tablet 324 mg (has no administration in time range)  vancomycin (VANCOCIN) IVPB 1000 mg/200 mL premix (has no administration in time range)  ceFEPIme (MAXIPIME) 2 g in sodium chloride 0.9 % 100 mL IVPB (has no administration in time range)  lactated ringers bolus 500 mL (has no administration in time range)  predniSONE (DELTASONE) tablet 15 mg (has no administration in time range)     IMPRESSION / MDM / ASSESSMENT AND PLAN / ED COURSE  I reviewed the triage vital signs and the nursing notes.                              Differential diagnosis includes, but is not limited to, recurrent bronchiectasis/bronchial  malacia, organizing pneumonia, pneumonia, structural lung disease, reactive airway disease etc.  No chest pain no signs or symptoms highly suggestive of ACS.  No clinical evidence to suggest high risk for PE, namely no chest pain, no clinical exam findings of PE.  No tachycardia.  He also has an established  long history of pulmonary disease with recent similar.    Patient's presentation is most consistent with acute complicated illness / injury requiring diagnostic workup.  Labs notable for elevated troponin but no associated chest pain and ECG demonstrates no ischemia.  I suspect this is demand but difficult to exclude other cause such as early NSTEMI, PE.  I have ordered a perfusion study in hopes to elucidate if PE may be a potential cause.  He has significant renal disease with acute on chronic renal injury  I also noted is a transaminitis pattern with a normal bilirubin.  Question if there may be hepatic injury occurring.  He associates no abdominal pain or abdominal symptoms, he has no abdominal pain elucidated by exam.  Question if this may be secondary to other developing process or sepsis.  His platelet count is appropriate.  There is a leukocytosis which is elevating.  Code sepsis initiated.  Discussed with our pulmonologist Dr. Jayme Cloud, broad-spectrum antibiotics initiated per her recommendation and pulmonary will provide consultation   Ongoing care and consult discussed with and patient accepted hospitalist by Dr. Huel Cote     FINAL CLINICAL IMPRESSION(S) / ED DIAGNOSES   Final diagnoses:  HCAP (healthcare-associated pneumonia)  Sepsis, due to unspecified organism, unspecified whether acute organ dysfunction present (HCC)  AKI (acute kidney injury) (HCC)  Transaminitis     Rx / DC Orders   ED Discharge Orders     None        Note:  This document was prepared using Dragon voice recognition software and may include unintentional dictation errors.   Sharyn Creamer,  MD 08/30/23 1536

## 2023-08-30 NOTE — Assessment & Plan Note (Signed)
-   Continue home steroids

## 2023-08-30 NOTE — Assessment & Plan Note (Addendum)
Unclear etiology at this time.  No prior history of cirrhosis, and INR and platelets are within normal limits.  He is not experiencing any abdominal pain to suggest cholecystitis.  No reported alcohol use.  I wonder if he may have had hypotension when he was hypoxic and altered earlier today.  - Recheck CMP in the a.m. - Right upper quadrant ultrasound ordered - Hold home statin

## 2023-08-30 NOTE — Sepsis Progress Note (Signed)
Sepsis protocol is being followed by [facility]. 

## 2023-08-30 NOTE — ED Triage Notes (Signed)
Pt to ED via EMS for SOB from [facility]. Pt was satting in the 80's at facility, was on 4 L since recent hospitalizion for pneumonia/sepsis. Pt has been coughing up copious amounts of phloem.

## 2023-08-30 NOTE — Consult Note (Signed)
PHARMACY - ANTICOAGULATION CONSULT NOTE  Pharmacy Consult for heparin infusion Indication: chest pain/ACS  Allergies  Allergen Reactions   Oxycodone-Acetaminophen Other (See Comments)    Stops breathing; has tolerated Tylenol before   Oxycodone-Acetaminophen Anaphylaxis    Patient Measurements: Hight: 70 inches Wt 117.3 kg IBW 73 kg Heparin Dosing Weight: 99 kg  Vital Signs: Temp: 97.8 F (36.6 C) (12/22 1240) Temp Source: Oral (12/22 1240) BP: 143/94 (12/22 1530) Pulse Rate: 83 (12/22 1530)  Labs: Recent Labs    08/28/23 0455 08/30/23 1328  HGB 10.1* 10.8*  HCT 31.1* 34.4*  PLT 292 331  APTT  --  25  LABPROT  --  15.1  INR  --  1.2  CREATININE 3.73* 4.46*  TROPONINIHS  --  341*    Estimated Creatinine Clearance: 17.2 mL/min (A) (by C-G formula based on SCr of 4.46 mg/dL (H)).   Medical History: Past Medical History:  Diagnosis Date   Arthritis    Chronic kidney disease    Complication of anesthesia    constipation and inability to  urinate happened a few days after cataract surgery    Diabetes (HCC)    type 2    Diverticulitis    GERD (gastroesophageal reflux disease)    Gout    Heart attack (HCC)    Heart disease    Heart murmur    Hyperglycemia    Hyperlipidemia    Hypertension    Osteoporosis    Pneumonia    hx of BOOP followed by Dr Sherene Sires    Sleep apnea    cpapp - setting at 17     Medications:  No AC prior to admission per med list and fill history  Baseline CBC appropriate to start heparin infusion. Baseline INR = 1.2, Aptt 25  Assessment: Patient admitted to ER with shortness of breath. PMH includes hypertension, diabetes, hyperlipidemia, gout, CAD status post CABG in 1997, GERD, polycystic kidney disease, and chronic kidney disease stage IV. Also noted dx of idiopathic pulmonary disease with recent discharge from [facility] on 12/20.  Goal of Therapy:  Heparin level 0.3-0.7 units/ml Monitor platelets by anticoagulation protocol: Yes    Plan:  Give 4000 units bolus x 1 Start heparin infusion at 1200 units/hr Check anti-Xa level in 8 hours and daily while on heparin Continue to monitor H&H and platelets  Caven Perine Rodriguez-Guzman PharmD, BCPS 08/30/2023 4:26 PM

## 2023-08-30 NOTE — Assessment & Plan Note (Addendum)
-   Hold home antiglycemic agents - SSI, moderate - Decrease home gabapentin at bedtime from 300 mg to 100 mg given AKI

## 2023-08-30 NOTE — Consult Note (Signed)
NAME:  Kurt Aguilar, MRN:  [id number], DOB:  05-24-1946, LOS: 0 ADMISSION DATE:  08/30/2023, CONSULTATION DATE: 08/30/2023 REFERRING MD: Verdene Lennert, MD CHIEF COMPLAINT: Acute respiratory failure with hypoxia, healthcare associated pneumonia.  History of Present Illness:  The patient is a 77 year old who was recently discharged from [facility] with the diagnosis of acute hypoxemic respiratory failure, severe sepsis and community-acquired pneumonia, this was from 20 August 2019 24 through 28 August 2023.  He was treated with 7 days total of antibiotics however reviewing the medications from that visit it appears that he only had Azithromycin for 5 days.  The patient has been followed by [facility] (Dr. Wert/Dr. Maple Hudson) in [place] with presumptive diagnosis of organizing pneumonia and OSA requiring CPAP.  He has been on chronic steroid therapy for approximately 7 years monitoring ESR's.  He has not had pulmonary biopsy.  During his recent admission to [facility] he had a high-resolution chest CT which showed right lower lobe pneumonia severe on the right lower lobe possible underlying interstitial lung disease, trace bilateral pleural effusions, calcified pleural plaques in the left hemithorax and severe tracheobronchomalacia on dynamic images.  There was also significant calcification of the aortic valve and mitral annulus consistent with the patient's known aortic stenosis and mitral stenosis most recently shown on May 2024 echocardiogram.  The patient was discharged 2 days ago and today developed chest discomfort in the upper chest and increasing shortness of breath.  He also had an episode of disorientation.  He has been in rehab at [facility] and was able to be assessed quickly and brought to the emergency room via EMS.  He reported that he had had a fairly productive cough for the last day and a half but cannot tell what color the sputum is.  On presentation to the emergency room his oxygen  saturations on room air were 88%.  He was placed on 4 L nasal cannula O2 with resolution of his dyspnea and chest discomfort.  Review of systems is significant for shortness of breath chest pain and sputum production as noted above.  He has had no hemoptysis.  He does have issues with recurrent arthralgias some myalgias.  He has recurrent issues with shortness of breath which usually respond to recent steroids.  Does not report any nasal or ear tenderness however cannot recall if he has had this in the past.  He is prone to otitis externa he states ears get "a film".  He does not endorse any orthopnea or paroxysmal nocturnal dyspnea.  He has issues with chronic back pain.  No lower extremity edema.  Does not endorse any other symptomatology.  Laboratory data thus far shows that the patient has a persistent lactic acidosis over the last 10 days, troponin elevated at 341.  Chest x-ray shows persistent opacification of the left base with small left pleural effusion possible small right pleural effusion as well, cardiomegaly.  I have reviewed the records from the most recent admission including Dr. Lianne Bushy Dgayli's consult and notes by Dr. Sherene Sires and Dr. Maple Hudson.   Pertinent  Medical History  Presumptive organizing pneumonia Longstanding therapy with corticosteroids Diabetes mellitus type 2 Coronary artery disease Aortic stenosis/mitral stenosis CKD stage IV Polycystic kidney disease Tracheobronchomalacia (TBM) Excessive dynamic airway collapse (EDAC) Obstructive sleep apnea on CPAP (auto CPAP 10 to 20 cm H2O, [employer] [facility]) Prior occupational exposure: Toluene/asbestos  Significant Hospital Events: Including procedures, antibiotic start and stop dates in addition to other pertinent events   08/30/2023 patient readmitted  with working diagnosis of acute respiratory failure with healthcare associated pneumonia, chest pain with elevated troponins rule out ACS  Interim History / Subjective:  Patient notes  relief of shortness of breath with supplemental oxygen.  Chest pain has also subsided with the oxygen supplementation.  Currently cough is controlled.  Objective   Blood pressure (!) 143/94, pulse 83, temperature 97.8 F (36.6 C), temperature source Oral, resp. rate 18, SpO2 100%.       No intake or output data in the 24 hours ending 08/30/23 1619 There were no vitals filed for this visit.  SpO2: 100 % O2 Flow Rate (L/min): 4 L/min  Examination: GENERAL: Obese, well-developed gentleman, appears acutely on chronically ill.  Laying on stretcher, mild tachypnea, no conversational dyspnea.  Awake, alert, conversant. HEAD: Normocephalic, atraumatic.  EYES: Pupils equal, round, reactive to light.  No scleral icterus.  EARS: No exudate, no tenderness. MOUTH: Oral mucosa moist.  No thrush. NECK: Supple. No thyromegaly. Trachea midline. No JVD.  No adenopathy. PULMONARY: Good air entry bilaterally.  No adventitious sounds. CARDIOVASCULAR: S1 and S2. Regular rate and rhythm.  There is a loud 3/6 aortic stenosis murmur, no gallop.  No rubs. ABDOMEN: Obese, soft, nondistended, normoactive bowel sounds, some ecchymoses over the abdomen from prior VTE prophylaxis injections.  Mild engorgement over the abdominal veins. MUSCULOSKELETAL: No joint deformity, no clubbing, no edema.  NEUROLOGIC: No overt focal deficit.  Speech is fluent. SKIN: Intact,warm,dry. PSYCH: Mood and behavior normal.  An independent review of CT chest shows that he has increasing bilateral pleural effusions.  No significant tracheobronchial collapse on today's film.  However film was not dynamic.  Bilateral atelectasis/infiltrates.  Formal interpretation by radiology pending.  Resolved Hospital Problem list   N/A  Assessment & Plan:  Acute respiratory failure with hypoxia Healthcare associated pneumonia Panculture, streptococcal/Legionella antigens, sputum for culture, Fungitell Broaden spectrum of antibiotics Received  vancomycin x 1, hold further doses unless indicated  Chest pain, elevated troponin, lactic acidosis Workup for ACS Hx: CAD, aortic stenosis/mitral stenosis Will be placed on heparin infusion Serial troponins Echocardiogram to eval LVEF Cardiology has been consulted  Acute kidney injury CKD 4 Volume overload Renal has been consulted Hold off on further IV fluids Assess LVEF vis--vis renal perfusion Continue to monitor Avoid nephrotoxic medications  History of presumptive organizing pneumonia Chronically elevated ESR On steroids chronically Continue baseline steroid dose but do not pulse further Check ESR/CRP Checking Fungitell for potential PJP given chronic steroid use Repeat high-resolution CT chest once acute process has resolved  Tracheobronchomalacia Excessive dynamic airway collapse Flutter valve after nebulizer treatments Nebulizer treatments to help with mucociliary clearance Continue CPAP nocturnally Query cause, chronically elevated ESR, query RP  Diabetes mellitus type 2 poor control Exacerbated by steroids Insulin management per hospitalist team   Best Practice (right click and "Reselect all SmartList Selections" daily)   Diet/type: Regular consistency (see orders) DVT prophylaxis systemic heparin Pressure ulcer(s): N/A GI prophylaxis: N/A Lines: N/A Foley:  N/A Code Status:  DNR Last date of multidisciplinary goals of care discussion [08/30/2023 patient, wife, hospitalist present, patient is DNR/DNI but continue current therapy]  Labs   CBC: Recent Labs  Lab 08/25/23 0442 08/26/23 0825 08/28/23 0455 08/30/23 1328  WBC 12.1* 12.3* 7.2 13.5*  NEUTROABS  --   --   --  10.8*  HGB 11.4* 11.0* 10.1* 10.8*  HCT 34.3* 33.7* 31.1* 34.4*  MCV 97.7 100.0 101.0* 102.7*  PLT 218 271 292 331    Basic Metabolic Panel:  Recent Labs  Lab 08/25/23 0442 08/25/23 1346 08/26/23 0711 08/27/23 0936 08/28/23 0455 08/30/23 1328  NA 141  --  142 142 143  140  K 5.4* 4.3 4.6 4.3 4.6 4.6  CL 106  --  104 102 101 93*  CO2 23  --  24 27 27 31   GLUCOSE 103*  --  115* 146* 117* 224*  BUN 74*  --  79* 80* 86* 94*  CREATININE 3.04*  --  3.52* 3.64* 3.73* 4.46*  CALCIUM 8.7*  --  8.7* 8.8* 9.0 9.5  MG 2.5*  --  2.5*  --  2.9*  --    GFR: Estimated Creatinine Clearance: 17.2 mL/min (A) (by C-G formula based on SCr of 4.46 mg/dL (H)). Recent Labs  Lab 08/25/23 0442 08/26/23 0825 08/28/23 0455 08/30/23 1328  WBC 12.1* 12.3* 7.2 13.5*  LATICACIDVEN  --   --   --  3.4*    Liver Function Tests: Recent Labs  Lab 08/30/23 1328  AST 257*  ALT 403*  ALKPHOS 184*  BILITOT 0.8  PROT 7.1  ALBUMIN 3.0*   No results for input(s): "LIPASE", "AMYLASE" in the last 168 hours. No results for input(s): "AMMONIA" in the last 168 hours.  ABG    Component Value Date/Time   HCO3 26.6 08/20/2023 1305   TCO2 20 (L) 09/17/2021 1856   ACIDBASEDEF 7.0 (H) 09/17/2021 1856   O2SAT 60.5 08/20/2023 1305     Coagulation Profile: Recent Labs  Lab 08/30/23 1328  INR 1.2    Cardiac Enzymes: No results for input(s): "CKTOTAL", "CKMB", "CKMBINDEX", "TROPONINI" in the last 168 hours.  HbA1C: Hemoglobin A1C  Date/Time Value Ref Range Status  05/08/2023 11:11 AM 6.3 (A) 4.0 - 5.6 % Final  09/05/2022 01:26 PM 7.9 (A) 4.0 - 5.6 % Final   Hgb A1c MFr Bld  Date/Time Value Ref Range Status  05/17/2020 02:44 PM 6.6 (H) 4.8 - 5.6 % Final    Comment:    (NOTE) Pre diabetes:          5.7%-6.4%  Diabetes:              >6.4%  Glycemic control for   <7.0% adults with diabetes   03/24/2019 09:25 AM 6.3 4.6 - 6.5 % Final    Comment:    Glycemic Control Guidelines for People with Diabetes:Non Diabetic:  <6%Goal of Therapy: <7%Additional Action Suggested:  >8%     CBG: Recent Labs  Lab 08/27/23 1643 08/27/23 2139 08/28/23 0739 08/28/23 1105 08/28/23 1557  GLUCAP 141* 212* 117* 109* 249*    Review of Systems:   A 10 point review of systems was  performed and it is as noted above otherwise negative.  Past Medical History:  He,  has a past medical history of Arthritis, Chronic kidney disease, Complication of anesthesia, Diabetes (HCC), Diverticulitis, GERD (gastroesophageal reflux disease), Gout, Heart attack (HCC), Heart disease, Heart murmur, Hyperglycemia, Hyperlipidemia, Hypertension, Osteoporosis, Pneumonia, and Sleep apnea.   Surgical History:   Past Surgical History:  Procedure Laterality Date   ACROMIO-CLAVICULAR JOINT REPAIR Left 05/05/2019   Procedure: SHOULDER ARTHROSCOPIC ASSISTED ACROMIO-CLAVICULAR JOINT REPAIR;  Surgeon: Jones Broom, MD;  Location: [facility];  Service: Orthopedics;  Laterality: Left;   CATARACT EXTRACTION Bilateral    CORONARY ARTERY BYPASS GRAFT     [place]   LEFT HEART CATH AND CORS/GRAFTS ANGIOGRAPHY N/A 09/19/2021   Procedure: LEFT HEART CATH AND CORS/GRAFTS ANGIOGRAPHY;  Surgeon: Lyn Records, MD;  Location: [facility] INVASIVE CV  LAB;  Service: Cardiovascular;  Laterality: N/A;   open heart surgery  09/09/1995-1998   5 bypass   REPAIR KNEE LIGAMENT     right   SHOULDER ARTHROSCOPY Left 05/05/2019   Procedure: ARTHROSCOPY SHOULDER;  Surgeon: Jones Broom, MD;  Location: [facility];  Service: Orthopedics;  Laterality: Left;   TONSILLECTOMY     TOTAL KNEE ARTHROPLASTY Left 05/29/2020   Procedure: LEFT TOTAL KNEE ARTHROPLASTY;  Surgeon: Marcene Corning, MD;  Location: [facility];  Service: Orthopedics;  Laterality: Left;     Social History:   reports that he quit smoking about 18 years ago. His smoking use included cigars and cigarettes. He started smoking about 21 years ago. He has a 3 pack-year smoking history. He has never used smokeless tobacco. He reports current alcohol use of about 1.0 standard drink of alcohol per week. He reports that he does not use drugs.   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  Family History:  His family history includes Heart attack (age of onset: 9) in his son; Heart disease in his father and mother; Stroke in his father. There is no history of Kidney disease, Prostate cancer, or Diabetes.   Allergies Allergies  Allergen Reactions   Oxycodone-Acetaminophen Other (See Comments)    Stops breathing; has tolerated Tylenol before   Oxycodone-Acetaminophen Anaphylaxis     Home Medications  Prior to Admission medications   Medication Sig Start Date End Date Taking? Authorizing Provider  albuterol (PROVENTIL) (2.5 MG/3ML) 0.083% nebulizer solution Take 3 mLs (2.5 mg total) by nebulization 2 (two) times daily. 08/28/23   Marcelino Duster, MD  allopurinol (ZYLOPRIM) 100 MG tablet TAKE 1 TABLET BY MOUTH TWICE A DAY 06/08/23   Tower, Audrie Gallus, MD  aspirin EC (ASPIRIN 81) 81 MG tablet Take 81 mg by mouth daily. Swallow whole.    [provider]  benzonatate (TESSALON) 200 MG capsule Take 1 capsule (200 mg total) by mouth 3 (three) times daily. 08/28/23   Marcelino Duster, MD  bisoprolol (ZEBETA) 5 MG tablet Take 0.5 tablets (2.5 mg total) by mouth daily. 08/29/23   Marcelino Duster, MD  Calcium Carbonate-Vit D-Min (CALCIUM-VITAMIN D-MINERALS) 600-800 MG-UNIT CHEW Chew 1 tablet by mouth daily.    [provider]  colchicine 0.6 MG tablet Take 1 tablet (0.6 mg total) by mouth as needed (GOUT FLARE). TAKE 1 TABLET BY MOUTH TWICE A DAY WITH A MEAL AS NEEDED FOR ACUTE FLARE OF GOUT 04/22/23   Tower, Audrie Gallus, MD  Continuous Blood Gluc Receiver (FREESTYLE LIBRE 2 READER) DEVI USE AS INSTRUCTED TO CHECK BLOOD SUGARS. 01/13/22   Romero Belling, MD  Continuous Blood Gluc Sensor (FREESTYLE LIBRE 2 SENSOR) MISC 1 Device by Does not apply route every 14 (fourteen) days. 10/31/22   Carlus Pavlov, MD  cyanocobalamin (VITAMIN  B12) 500 MCG tablet Take 500 mcg by mouth daily.    [provider]  ezetimibe (ZETIA) 10 MG tablet TAKE 1 TABLET BY MOUTH EVERY DAY 07/08/23   Tower, Audrie Gallus, MD  famotidine (PEPCID) 20 MG tablet TAKE 1 TABLET BY MOUTH EVERYDAY AT BEDTIME 04/10/23   Nyoka Cowden, MD  gabapentin (NEURONTIN) 100 MG capsule Take 300 mg by mouth daily. 01/04/23   [provider]  Glucosamine  HCl (GLUCOSAMINE PO) Take 1,500 mg by mouth 2 (two) times daily.     [provider]  glucose blood (FREESTYLE LITE) test strip 1 each by Other route 4 (four) times daily. E11.65 04/10/20   Romero Belling, MD  insulin isophane & regular human KwikPen (NOVOLIN 70/30 KWIKPEN) (70-30) 100 UNIT/ML KwikPen Inject 30 Units into the skin daily with breakfast. And pen needles 1/day 05/08/23   Carlus Pavlov, MD  insulin lispro (HUMALOG KWIKPEN) 200 UNIT/ML KwikPen Inject 10-14 Units into the skin 2 (two) times daily before a meal. INJECT 12-15 UNITS INTO THE SKIN 2 (TWO) TIMES DAILY BEFORE A MEAL. 05/08/23   Carlus Pavlov, MD  Insulin Pen Needle (BD PEN NEEDLE NANO 2ND GEN) 32G X 4 MM MISC USE 3 TIMES A DAY 01/25/23   Carlus Pavlov, MD  ketoconazole (NIZORAL) 2 % cream Apply 1 Application topically 2 (two) times daily. 11/10/22   [provider]  Lancets (FREESTYLE) lancets 1 EACH BY OTHER ROUTE 4 (FOUR) TIMES DAILY. E11.65 08/23/20   Romero Belling, MD  Lutein 20 MG CAPS Take 20 mg by mouth daily.    [provider]  meclizine (ANTIVERT) 12.5 MG tablet TAKE 1-2 TABLETS BY MOUTH THREE TIMES A DAY AS NEEDED FOR DIZZINESS 10/30/22   [provider]  Multiple Vitamins-Minerals (PRESERVISION AREDS 2 PO) Take by mouth.    [provider]  nitroGLYCERIN (NITROSTAT) 0.4 MG SL tablet Place 1 tablet (0.4 mg total) under the tongue every 5 (five) minutes x 3 doses as needed for chest pain. 01/19/23   Antonieta Iba, MD  omeprazole (PRILOSEC) 40 MG capsule TAKE 1 CAPUSLE BY MOUTH 30- 60  MIN BEFORE YOUR FIRST AND LAST MEALS OF THE DAY 10/31/22   Nyoka Cowden, MD  ondansetron (ZOFRAN) 8 MG tablet Take 1 tablet (8 mg total) by mouth every 8 (eight) hours as needed for nausea or vomiting. From narcotic pain medicine 01/21/23   Tower, Audrie Gallus, MD  polyethylene glycol (MIRALAX / GLYCOLAX) 17 g packet Take 17 g by mouth daily.    [provider]  predniSONE (DELTASONE) 5 MG tablet Take 3 tablets (15 mg total) by mouth daily with breakfast for 14 days, THEN 2 tablets (10 mg total) daily with breakfast for 14 days, THEN 1 tablet (5 mg total) daily with breakfast for 14 days. 3 daily. 08/28/23 10/09/23  Marcelino Duster, MD  rosuvastatin (CRESTOR) 40 MG tablet TAKE 1 TABLET BY MOUTH EVERY DAY 11/18/22   Tower, Audrie Gallus, MD  Semaglutide, 2 MG/DOSE, (OZEMPIC, 2 MG/DOSE,) 8 MG/3ML SOPN Inject 2 mg into the skin once a week. 05/08/23   Carlus Pavlov, MD  senna-docusate (SENOKOT-S) 8.6-50 MG tablet Take 1 tablet by mouth at bedtime. 08/28/23   Marcelino Duster, MD  torsemide 40 MG TABS Take 40 mg by mouth daily. 08/29/23   Marcelino Duster, MD    Scheduled Meds:  ipratropium-albuterol  3 mL Nebulization Q6H   sodium chloride flush  3 mL Intravenous Q12H   Continuous Infusions:  lactated ringers     vancomycin 1,000 mg (08/30/23 1559)   PRN Meds:.acetaminophen **OR** acetaminophen, ondansetron **OR** ondansetron (ZOFRAN) IV, polyethylene glycol   Level 4 consult    I discussed the case in detail with Dr. Verdene Lennert.  Dr. Janann Colonel assume pulmonary consultative services in AM.  I spent 65 minutes of dedicated to the care of this patient on the date of this encounter to include pre-visit review of records, face-to-face time  with the patient discussing conditions above, post visit ordering of testing, clinical documentation with the electronic health record, making appropriate referrals as documented, and communicating necessary findings to members of  the patients care team.   C. Danice Goltz, MD Advanced Bronchoscopy PCCM [facility]    *This note was generated using voice recognition software/Dragon and/or AI transcription program.  Despite best efforts to proofread, errors can occur which can change the meaning. Any transcriptional errors that result from this process are unintentional and may not be fully corrected at the time of dictation.

## 2023-08-31 ENCOUNTER — Inpatient Hospital Stay: Payer: PPO

## 2023-08-31 ENCOUNTER — Inpatient Hospital Stay
Admit: 2023-08-31 | Discharge: 2023-08-31 | Disposition: A | Discharge status: Home / Self Care | Payer: PPO | Attending: Internal Medicine

## 2023-08-31 DIAGNOSIS — J188 Other pneumonia, unspecified organism: Secondary | ICD-10-CM

## 2023-08-31 DIAGNOSIS — I214 Non-ST elevation (NSTEMI) myocardial infarction: Secondary | ICD-10-CM | POA: Diagnosis not present

## 2023-08-31 DIAGNOSIS — I2489 Other forms of acute ischemic heart disease: Secondary | ICD-10-CM | POA: Diagnosis not present

## 2023-08-31 DIAGNOSIS — R7989 Other specified abnormal findings of blood chemistry: Secondary | ICD-10-CM | POA: Diagnosis not present

## 2023-08-31 DIAGNOSIS — J9601 Acute respiratory failure with hypoxia: Secondary | ICD-10-CM | POA: Diagnosis not present

## 2023-08-31 LAB — CBC WITH DIFFERENTIAL/PLATELET
Abs Immature Granulocytes: 0.98 10*3/uL — ABNORMAL HIGH (ref 0.00–0.07)
Basophils Absolute: 0.1 10*3/uL (ref 0.0–0.1)
Basophils Relative: 1 %
Eosinophils Absolute: 0 10*3/uL (ref 0.0–0.5)
Eosinophils Relative: 0 %
HCT: 30.3 % — ABNORMAL LOW (ref 39.0–52.0)
Hemoglobin: 9.8 g/dL — ABNORMAL LOW (ref 13.0–17.0)
Immature Granulocytes: 8 %
Lymphocytes Relative: 8 %
Lymphs Abs: 1 10*3/uL (ref 0.7–4.0)
MCH: 32.5 pg (ref 26.0–34.0)
MCHC: 32.3 g/dL (ref 30.0–36.0)
MCV: 100.3 fL — ABNORMAL HIGH (ref 80.0–100.0)
Monocytes Absolute: 0.5 10*3/uL (ref 0.1–1.0)
Monocytes Relative: 4 %
Neutro Abs: 10.2 10*3/uL — ABNORMAL HIGH (ref 1.7–7.7)
Neutrophils Relative %: 79 %
Platelets: 274 10*3/uL (ref 150–400)
RBC: 3.02 MIL/uL — ABNORMAL LOW (ref 4.22–5.81)
RDW: 14.7 % (ref 11.5–15.5)
Smear Review: NORMAL
WBC: 12.7 10*3/uL — ABNORMAL HIGH (ref 4.0–10.5)
nRBC: 0.2 % (ref 0.0–0.2)

## 2023-08-31 LAB — COMPREHENSIVE METABOLIC PANEL
ALT: 284 U/L — ABNORMAL HIGH (ref 0–44)
AST: 140 U/L — ABNORMAL HIGH (ref 15–41)
Albumin: 2.6 g/dL — ABNORMAL LOW (ref 3.5–5.0)
Alkaline Phosphatase: 145 U/L — ABNORMAL HIGH (ref 38–126)
Anion gap: 15 (ref 5–15)
BUN: 88 mg/dL — ABNORMAL HIGH (ref 8–23)
CO2: 28 mmol/L (ref 22–32)
Calcium: 8.8 mg/dL — ABNORMAL LOW (ref 8.9–10.3)
Chloride: 97 mmol/L — ABNORMAL LOW (ref 98–111)
Creatinine, Ser: 4.29 mg/dL — ABNORMAL HIGH (ref 0.61–1.24)
GFR, Estimated: 13 mL/min — ABNORMAL LOW (ref >60)
Glucose, Bld: 293 mg/dL — ABNORMAL HIGH (ref 70–99)
Potassium: 4.3 mmol/L (ref 3.5–5.1)
Sodium: 140 mmol/L (ref 135–145)
Total Bilirubin: 0.8 mg/dL (ref <1.2)
Total Protein: 6.1 g/dL — ABNORMAL LOW (ref 6.5–8.1)

## 2023-08-31 LAB — ECHOCARDIOGRAM COMPLETE
AR max vel: 3.04 cm2
AV Area VTI: 3.47 cm2
AV Area mean vel: 2.8 cm2
AV Mean grad: 2 mm[Hg]
AV Peak grad: 3 mm[Hg]
Ao pk vel: 0.87 m/s
Area-P 1/2: 2.68 cm2
MV VTI: 1.38 cm2
S' Lateral: 3.05 cm

## 2023-08-31 LAB — HEPARIN LEVEL (UNFRACTIONATED)
Heparin Unfractionated: 0.77 [IU]/mL — ABNORMAL HIGH (ref 0.30–0.70)
Heparin Unfractionated: 0.72 [IU]/mL — ABNORMAL HIGH (ref 0.30–0.70)
Heparin Unfractionated: 0.68 [IU]/mL (ref 0.30–0.70)

## 2023-08-31 LAB — CBG MONITORING, ED
Glucose-Capillary: 223 mg/dL — ABNORMAL HIGH (ref 70–99)
Glucose-Capillary: 205 mg/dL — ABNORMAL HIGH (ref 70–99)
Glucose-Capillary: 196 mg/dL — ABNORMAL HIGH (ref 70–99)
Glucose-Capillary: 262 mg/dL — ABNORMAL HIGH (ref 70–99)

## 2023-08-31 LAB — TROPONIN I (HIGH SENSITIVITY): Troponin I (High Sensitivity): 232 ng/L (ref <18)

## 2023-08-31 LAB — LACTIC ACID, PLASMA: Lactic Acid, Venous: 2.3 mmol/L (ref 0.5–1.9)

## 2023-08-31 MED ORDER — PREDNISONE 20 MG PO TABS
40.0000 mg | ORAL_TABLET | Freq: Every day | ORAL | Status: DC
  Administered 2023-08-31 – 2023-09-06 (×7): 40 mg via ORAL
  Filled 2023-08-31 (×7): qty 2

## 2023-08-31 MED ORDER — VANCOMYCIN HCL IN DEXTROSE 1-5 GM/200ML-% IV SOLN
1000.0000 mg | Freq: Once | INTRAVENOUS | Status: AC
  Administered 2023-08-31: 1000 mg via INTRAVENOUS
  Filled 2023-08-31: qty 200

## 2023-08-31 MED ORDER — TECHNETIUM TO 99M ALBUMIN AGGREGATED
4.2800 | Freq: Once | INTRAVENOUS | Status: AC | PRN
  Administered 2023-08-31: 4.28 via INTRAVENOUS

## 2023-08-31 MED ORDER — PERFLUTREN LIPID MICROSPHERE
1.0000 mL | INTRAVENOUS | Status: AC | PRN
  Administered 2023-08-31: 2 mL via INTRAVENOUS

## 2023-08-31 MED ORDER — VANCOMYCIN VARIABLE DOSE PER UNSTABLE RENAL FUNCTION (PHARMACIST DOSING): Status: DC

## 2023-08-31 MED ORDER — CEFEPIME HCL 2 G IV SOLR
2.0000 g | INTRAVENOUS | Status: DC
  Administered 2023-08-31 – 2023-09-01 (×2): 2 g via INTRAVENOUS
  Filled 2023-08-31 (×3): qty 12.5

## 2023-08-31 MED ORDER — PREDNISONE 20 MG PO TABS: 40.0000 mg | ORAL_TABLET | Freq: Every day | ORAL | Status: DC

## 2023-08-31 MED ORDER — QUETIAPINE FUMARATE 25 MG PO TABS
50.0000 mg | ORAL_TABLET | Freq: Every day | ORAL | Status: DC
  Administered 2023-08-31: 50 mg via ORAL
  Filled 2023-08-31: qty 2

## 2023-08-31 NOTE — ED Notes (Signed)
Cardiology at bedside.

## 2023-08-31 NOTE — Consult Note (Signed)
PHARMACY - ANTICOAGULATION CONSULT NOTE  Pharmacy Consult for Heparin Infusion Indication: chest pain/ACS  Patient Measurements: Hight: 70 inches Wt 117.3 kg IBW 73 kg Heparin Dosing Weight: 99 kg  Labs: Recent Labs    08/30/23 1328 08/30/23 2049 08/31/23 0142 08/31/23 0144 08/31/23 1145  HGB 10.8*  --   --  9.8*  --   HCT 34.4*  --   --  30.3*  --   PLT 331  --   --  274  --   APTT 25  --   --   --   --   LABPROT 15.1  --   --   --   --   INR 1.2  --   --   --   --   HEPARINUNFRC  --   --  0.77*  --  0.72*  CREATININE 4.46* 4.31*  --  4.29*  --   TROPONINIHS 341* 275* 232*  --   --     Estimated Creatinine Clearance: 17.9 mL/min (A) (by C-G formula based on SCr of 4.29 mg/dL (H)).   Medical History: Past Medical History:  Diagnosis Date   Arthritis    Chronic kidney disease    Complication of anesthesia    constipation and inability to  urinate happened a few days after cataract surgery    Diabetes (HCC)    type 2    Diverticulitis    GERD (gastroesophageal reflux disease)    Gout    Heart attack (HCC)    Heart disease    Heart murmur    Hyperglycemia    Hyperlipidemia    Hypertension    Osteoporosis    Pneumonia    hx of BOOP followed by Dr Sherene Sires    Sleep apnea    cpapp - setting at 17    Medications:  No AC prior to admission per med list and fill history  Baseline CBC appropriate to start heparin infusion. Baseline INR = 1.2, Aptt 25  Assessment: Patient admitted to ER with shortness of breath. PMH includes hypertension, diabetes, hyperlipidemia, gout, CAD status post CABG in 1997, GERD, polycystic kidney disease, and chronic kidney disease stage IV. Also noted dx of idiopathic pulmonary disease with recent discharge from [facility] on 12/20.  Goal of Therapy:  Heparin level 0.3-0.7 units/ml Monitor platelets by anticoagulation protocol: Yes   Plan:  --12/23 1145 = 0.72, supratherapeutic --Decrease heparin infusion to 1000 units/hr --Re-check HL 8  hours from rate change --Daily CBC per protocol while on IV heparin  Tressie Ellis 08/31/2023 12:20 PM

## 2023-08-31 NOTE — ED Notes (Signed)
Pt transported to Nuc Med. 

## 2023-08-31 NOTE — Progress Notes (Signed)
*  PRELIMINARY RESULTS* Echocardiogram 2D Echocardiogram has been performed.  Kurt Aguilar 08/31/2023, 2:59 PM

## 2023-08-31 NOTE — ED Notes (Signed)
Patient placed on personal CPAP.

## 2023-08-31 NOTE — Progress Notes (Signed)
   NAME:  Kurt Aguilar, MRN:  [id number], DOB:  May 28, 1946, LOS: 1 ADMISSION DATE:  08/30/2023  History of Present Illness:  Kurt Aguilar is a 77 year old male patient with a past medical history of CAD status post CABG in 1997, moderate mitral valve stenosis, severe tracheobronchomalacia, CKD stage IV, diabetes mellitus type 2, hypertension, hyperlipidemia and OSA on CPAP who is presenting to [facility] on 12/22 with acute hypoxic respiratory failure.  He was recently admitted to [facility] few days ago with acute hypoxic respiratory failure.  Symptoms started few days before his initial presentation with fever up to 101 after coming back from a cruise.  He was treated for community-acquired pneumonia with meropenem vancomycin and azithromycin.  He was discharged home on oxygen.  He comes back now with ongoing hypoxic respiratory failure worsening cough with sputum production.  CT chest with worsening multilobar pneumonia specifically at the right lung base in the posterior left lower lobe.  Respiratory viral panel 12/22 positive for rhinovirus.  Sputum culture 12/22 with few gram-positive cocci in pairs and clusters.  Aspergillus antigen 12/14 negative.  Beta D glucan pending  He is also being managed and evaluated for NSTEMI.  Also found with worsening kidney function from a baseline of 2.2 mg/dL now with creatinine of 4.3 mg/dL.  Pertinent  Medical History  As above.  Interim History / Subjective:  He feels generally okay, has complaints of worsening cough and shortness of breath.  Currently on 5 L nasal cannula satting 100%.  Objective   Blood pressure 123/73, pulse 86, temperature 98.3 F (36.8 C), temperature source Oral, resp. rate (!) 26, SpO2 97%.        Intake/Output Summary (Last 24 hours) at 08/31/2023 1337 Last data filed at 08/31/2023 1245 Gross per 24 hour  Intake 1000 ml  Output 250 ml  Net 750 ml   There were no  vitals filed for this visit.  GEN no acute distress HEENT supple neck, reactive pupils Pulmonary faint wheezing heard bilaterally CVS normal S1, normal S2, regular rate and rhythm Abdomen soft nontender nondistended positive bowel sounds Extremities warm well-perfused no edema  Labs and imaging were reviewed  Assessment & Plan:  Case of a 77 year old male patient chronically immunosuppressed on steroid for presumed OP presenting for worsening shortness of breath found to have rhinovirus + and few gram + Cocci in clusters in sputum.   #Acute hypoxic respiratory failure secondary to... #Rhinovirus pneumonia with superimposed possible bacterial pneumonia. Opportunistic infection is not ruled out., Pending fungitell. Asp Ag negative.  #NSTEMI likelt type II pending eval, currently on heparin drip.  #AKI on CKD in the setting of sepsis?   Plan  []  Broaden Abx to include mrsa coverage,  []  MRSA swab []  Increase prednisone to 40mg  PO daily  []  Follow up Beta-glucan  []  Pending sputum culture to result. IF no improvement on current regimen might consider a bronchoscopy with BAL.   I spent 60 minutes caring for this patient today, including preparing to see the patient, obtaining a medical history , reviewing a separately obtained history, performing a medically appropriate examination and/or evaluation, counseling and educating the patient/family/caregiver, documenting clinical information in the electronic health record, and independently interpreting results (not separately reported/billed) and communicating results to the patient/family/caregiver  Janann Colonel, MD [facility] 08/31/2023 1:52 PM

## 2023-08-31 NOTE — Consult Note (Signed)
Cardiology Consultation:   Patient ID: Kurt Aguilar; [id number]; [birth year]   Admit date: 08/30/2023 Date of Consult: 08/31/2023  Primary Care Provider: Judy Pimple, MD Primary Cardiologist: Mariah Milling Primary Electrophysiologist:  None   Patient Profile:   Kurt Aguilar is a 77 y.o. male with a hx of CAD status post CABG in 1997 in [place], [place], moderate mitral valve stenosis, tracheobronchomalacia , CKD stage IV, DM2, syncope, HTN, HLD, obesity, and OSA on CPAP who is admitted with acute on chronic hypoxic respiratory distress in the setting of multilobar pneumonia and we are seeing for elevated troponin at the request of Dr. Huel Cote.  History of Present Illness:   Kurt Aguilar was admitted in 09/2021 with NSTEMI.  LHC that showed severe diffuse calcified CAD of the native arteries with 100% proximal, 90% ostial LCx, 99% distal left main, and severely/diffusely diseased proximal to mid LAD with threatened septal perforator.  Widely patent LIMA to LAD, SVG to D1, and sequential SVG to first and second OM.  There was diffusely diseased SVG to the RCA with ostial to proximal 70% stenosis.  It was felt the SVG to RCA could be stented, though was not so critical that it was causing enzyme elevation at that time.  Echo at that time showed an EF of 60 to 65%, no regional wall motion abnormalities, grade 1 diastolic dysfunction, moderately reduced RV systolic function with normal ventricular cavity size no evidence of mitral regurgitation or stenosis, and poorly visualized aortic valve.  Previously reported near syncope/syncope on metoprolol to tartrate that improved with transition to metoprolol succinate.  Zio in 01/2023 showed a predominant rhythm of sinus with an average rate of 100 bpm with 2 episodes of SVT lasting up to 16 beats.  Echo in 01/2023 showed an EF of 60-65%, no RWMA, midl LVH, Gr1DD, normal RV systolic function and ventricular cavity size, moderately dilated left atrium,  mild mitral regurgitation, moderate mitral stenosis, moderate mitral annular calcification, and mild aortic stenosis.   He was recently admitted to [facility] from 08/20/23 through 08/28/23 with acute hypoxic respiratory failure secondary to tracheobronchomalacia and suspected LLL pneumonia with admission complicated by acute on CKD with suspected contrast-induced nephropathy.  He was treated with 5 days of meropenem and azithromycin, with an additional 2 days of Rocephin to complete a 7-day course. Prior to discharge, he received IV fluids and was resumed on torsemide. He was discharged on supplemental oxygen.   He returned to [facility] on 08/30/23 with continued dyspnea that was largely unchanged from how he felt upon presentation to the hospital on 08/20/23. He was found to have multilobar PNA on CT chest and has been placed on IV cefepime. Cardiology is consulted to weigh in on elevated high sensitivity troponin, with an initial and peak of 341, now down trending. BNP 336 (prior 205 on 12/10). Patient reports some chest soreness with coughing, though is otherwise without symptoms of angina or cardiac decompensation. No progressive orthopnea, early satiety, or abdominal distension. He does feel like his lower extremities have some mild swelling. Afebrile. Reports ongoing dyspnea and generalized fatigue, indicating he feels too weak to ambulate in the room at this time. He has been placed on a heparin gtt. Echo is pending. Currently, without angina.     Past Medical History:  Diagnosis Date   Arthritis    Chronic kidney disease    Complication of anesthesia    constipation and inability to  urinate happened a few days after cataract surgery  Diabetes (HCC)    type 2    Diverticulitis    GERD (gastroesophageal reflux disease)    Gout    Heart attack (HCC)    Heart disease    Heart murmur    Hyperglycemia    Hyperlipidemia    Hypertension    Osteoporosis    Pneumonia    hx of BOOP followed by Dr  Sherene Sires    Sleep apnea    cpapp - setting at 15     Past Surgical History:  Procedure Laterality Date   ACROMIO-CLAVICULAR JOINT REPAIR Left 05/05/2019   Procedure: SHOULDER ARTHROSCOPIC ASSISTED ACROMIO-CLAVICULAR JOINT REPAIR;  Surgeon: Jones Broom, MD;  Location: [facility];  Service: Orthopedics;  Laterality: Left;   CATARACT EXTRACTION Bilateral    CORONARY ARTERY BYPASS GRAFT     [place]   LEFT HEART CATH AND CORS/GRAFTS ANGIOGRAPHY N/A 09/19/2021   Procedure: LEFT HEART CATH AND CORS/GRAFTS ANGIOGRAPHY;  Surgeon: Lyn Records, MD;  Location: [facility];  Service: Cardiovascular;  Laterality: N/A;   open heart surgery  09/09/1995-1998   5 bypass   REPAIR KNEE LIGAMENT     right   SHOULDER ARTHROSCOPY Left 05/05/2019   Procedure: ARTHROSCOPY SHOULDER;  Surgeon: Jones Broom, MD;  Location: [facility];  Service: Orthopedics;  Laterality: Left;   TONSILLECTOMY     TOTAL KNEE ARTHROPLASTY Left 05/29/2020   Procedure: LEFT TOTAL KNEE ARTHROPLASTY;  Surgeon: Marcene Corning, MD;  Location: [facility];  Service: Orthopedics;  Laterality: Left;     Home Meds: Prior to Admission medications   Medication Sig Start Date End Date Taking? Authorizing Provider  albuterol (PROVENTIL) (2.5 MG/3ML) 0.083% nebulizer solution Take 3 mLs (2.5 mg total) by nebulization 2 (two) times daily. 08/28/23  Yes Marcelino Duster, MD  aspirin EC (ASPIRIN 81) 81 MG tablet Take 81 mg by mouth daily. Swallow whole.   Yes [provider]  benzonatate (TESSALON) 200 MG capsule Take 1 capsule (200 mg total) by mouth 3 (three) times daily. 08/28/23  Yes Marcelino Duster, MD  bisoprolol (ZEBETA) 5 MG tablet Take 0.5 tablets (2.5 mg total) by mouth daily. 08/29/23  Yes Marcelino Duster, MD  Calcium Carbonate-Vit D-Min (CALCIUM-VITAMIN D-MINERALS) 600-800 MG-UNIT CHEW Chew 1 tablet by mouth daily.   Yes [provider]  colchicine 0.6 MG tablet Take 1 tablet (0.6 mg total) by  mouth as needed (GOUT FLARE). TAKE 1 TABLET BY MOUTH TWICE A DAY WITH A MEAL AS NEEDED FOR ACUTE FLARE OF GOUT 04/22/23  Yes Tower, Marne A, MD  cyanocobalamin (VITAMIN B12) 500 MCG tablet Take 500 mcg by mouth daily.   Yes [provider]  ezetimibe (ZETIA) 10 MG tablet TAKE 1 TABLET BY MOUTH EVERY DAY 07/08/23  Yes Tower, Marne A, MD  famotidine (PEPCID) 20 MG tablet TAKE 1 TABLET BY MOUTH EVERYDAY AT BEDTIME 04/10/23  Yes Nyoka Cowden, MD  gabapentin (NEURONTIN) 100 MG capsule Take 300 mg by mouth daily. 01/04/23  Yes [provider]  Glucosamine HCl (GLUCOSAMINE PO) Take 1,500 mg by mouth 2 (two) times daily.    Yes [provider]  insulin isophane & regular human KwikPen (NOVOLIN 70/30 KWIKPEN) (70-30) 100 UNIT/ML KwikPen Inject 30 Units into the skin daily with breakfast. And pen needles 1/day 05/08/23  Yes Carlus Pavlov, MD  insulin lispro (HUMALOG KWIKPEN) 200 UNIT/ML KwikPen Inject 10-14 Units into the skin 2 (two) times daily before a meal. INJECT 12-15 UNITS INTO THE SKIN 2 (TWO) TIMES  DAILY BEFORE A MEAL. Patient taking differently: Inject 10 Units into the skin 2 (two) times daily before a meal. Inject before lunch and dinner. 05/08/23  Yes Carlus Pavlov, MD  ketoconazole (NIZORAL) 2 % cream Apply 1 Application topically 2 (two) times daily. 11/10/22  Yes [provider]  Lutein 20 MG CAPS Take 20 mg by mouth daily.   Yes [provider]  meclizine (ANTIVERT) 12.5 MG tablet TAKE 1-2 TABLETS BY MOUTH THREE TIMES A DAY AS NEEDED FOR DIZZINESS 10/30/22  Yes [provider]  Multiple Vitamins-Minerals (PRESERVISION AREDS 2 PO) Take by mouth.   Yes [provider]  nitroGLYCERIN (NITROSTAT) 0.4 MG SL tablet Place 1 tablet (0.4 mg total) under the tongue every 5 (five) minutes x 3 doses as needed for chest pain. 01/19/23  Yes Gollan, Tollie Pizza, MD  omeprazole (PRILOSEC) 40 MG capsule TAKE 1 CAPUSLE BY MOUTH 30- 60 MIN BEFORE YOUR  FIRST AND LAST MEALS OF THE DAY 10/31/22  Yes Nyoka Cowden, MD  ondansetron (ZOFRAN) 8 MG tablet Take 1 tablet (8 mg total) by mouth every 8 (eight) hours as needed for nausea or vomiting. From narcotic pain medicine 01/21/23  Yes Tower, Audrie Gallus, MD  polyethylene glycol (MIRALAX / GLYCOLAX) 17 g packet Take 17 g by mouth daily.   Yes [provider]  predniSONE (DELTASONE) 5 MG tablet Take 3 tablets (15 mg total) by mouth daily with breakfast for 14 days, THEN 2 tablets (10 mg total) daily with breakfast for 14 days, THEN 1 tablet (5 mg total) daily with breakfast for 14 days. 3 daily. 08/28/23 10/09/23 Yes Marcelino Duster, MD  rosuvastatin (CRESTOR) 40 MG tablet TAKE 1 TABLET BY MOUTH EVERY DAY 11/18/22  Yes Tower, Marne A, MD  Semaglutide, 2 MG/DOSE, (OZEMPIC, 2 MG/DOSE,) 8 MG/3ML SOPN Inject 2 mg into the skin once a week. 05/08/23  Yes Carlus Pavlov, MD  senna-docusate (SENOKOT-S) 8.6-50 MG tablet Take 1 tablet by mouth at bedtime. 08/28/23  Yes Marcelino Duster, MD  torsemide 40 MG TABS Take 40 mg by mouth daily. 08/29/23  Yes Marcelino Duster, MD  allopurinol (ZYLOPRIM) 100 MG tablet TAKE 1 TABLET BY MOUTH TWICE A DAY 06/08/23   Tower, Audrie Gallus, MD  Continuous Blood Gluc Receiver (FREESTYLE LIBRE 2 READER) DEVI USE AS INSTRUCTED TO CHECK BLOOD SUGARS. 01/13/22   Romero Belling, MD  Continuous Blood Gluc Sensor (FREESTYLE LIBRE 2 SENSOR) MISC 1 Device by Does not apply route every 14 (fourteen) days. 10/31/22   Carlus Pavlov, MD  glucose blood (FREESTYLE LITE) test strip 1 each by Other route 4 (four) times daily. E11.65 04/10/20   Romero Belling, MD  Insulin Pen Needle (BD PEN NEEDLE NANO 2ND GEN) 32G X 4 MM MISC USE 3 TIMES A DAY 01/25/23   Carlus Pavlov, MD  Lancets (FREESTYLE) lancets 1 EACH BY OTHER ROUTE 4 (FOUR) TIMES DAILY. E11.65 08/23/20   Romero Belling, MD    Inpatient Medications: Scheduled Meds:  aspirin EC  81 mg Oral Daily   cyanocobalamin  500 mcg Oral  Daily   ezetimibe  10 mg Oral Daily   famotidine  20 mg Oral QHS   gabapentin  100 mg Oral QHS   insulin aspart  0-15 Units Subcutaneous TID WC   ipratropium-albuterol  3 mL Nebulization Q6H   senna-docusate  1 tablet Oral QHS   sodium chloride flush  3 mL Intravenous Q12H   Continuous Infusions:  ceFEPime (MAXIPIME) IV  heparin 1,100 Units/hr (08/31/23 0254)   PRN Meds: acetaminophen **OR** acetaminophen, nitroGLYCERIN, ondansetron **OR** ondansetron (ZOFRAN) IV, polyethylene glycol  Allergies:   Allergies  Allergen Reactions   Oxycodone-Acetaminophen Other (See Comments)    Stops breathing; has tolerated Tylenol before   Oxycodone-Acetaminophen Anaphylaxis    Social History:   Social History   Socioeconomic History   Marital status: Married    Spouse name: Not on file   Number of children: 4   Years of education: 14   Highest education level: Not on file  Occupational History   Occupation: Engineer, petroleum: RETIRED  Tobacco Use   Smoking status: Former    Current packs/day: 0.00    Average packs/day: 1 pack/day for 3.0 years (3.0 ttl pk-yrs)    Types: Cigars, Cigarettes    Start date: 11/25/2001    Quit date: 11/25/2004    Years since quitting: 18.7   Smokeless tobacco: Never   Tobacco comments:    occas. cigar quit 2006  Vaping Use   Vaping status: Never Used  Substance and Sexual Activity   Alcohol use: Yes    Alcohol/week: 1.0 standard drink of alcohol    Types: 1 Standard drinks or equivalent per week    Comment: rare   Drug use: No   Sexual activity: Never  Other Topics Concern   Not on file  Social History Narrative   Not on file   Social Drivers of Health   Financial Resource Strain: Low Risk  (04/30/2023)   Overall Financial Resource Strain (CARDIA)    Difficulty of Paying Living Expenses: Not hard at all  Food Insecurity: No Food Insecurity (08/21/2023)   Hunger Vital Sign    Worried About Running Out of Food in the Last Year:  Never true    Ran Out of Food in the Last Year: Never true  Transportation Needs: No Transportation Needs (08/21/2023)   PRAPARE - Administrator, Civil Service (Medical): No    Lack of Transportation (Non-Medical): No  Physical Activity: Insufficiently Active (04/30/2023)   Exercise Vital Sign    Days of Exercise per Week: 2 days    Minutes of Exercise per Session: 30 min  Stress: No Stress Concern Present (04/30/2023)   [employer] of Occupational Health - Occupational Stress Questionnaire    Feeling of Stress : Not at all  Social Connections: Socially Integrated (04/30/2023)   Social Connection and Isolation Panel [NHANES]    Frequency of Communication with Friends and Family: More than three times a week    Frequency of Social Gatherings with Friends and Family: More than three times a week    Attends Religious Services: More than 4 times per year    Active Member of [employer] or Organizations: Yes    Attends Engineer, structural: More than 4 times per year    Marital Status: Married  Catering manager Violence: Not At Risk (08/21/2023)   Humiliation, Afraid, Rape, and Kick questionnaire    Fear of Current or Ex-Partner: No    Emotionally Abused: No    Physically Abused: No    Sexually Abused: No     Family History:   Family History  Problem Relation Age of Onset   Heart disease Mother    Heart disease Father    Stroke Father    Heart attack Son 45   Kidney disease Neg Hx    Prostate cancer Neg Hx    Diabetes Neg Hx  ROS:  Review of Systems  Constitutional:  Positive for malaise/fatigue. Negative for chills, diaphoresis, fever and weight loss.  HENT:  Negative for congestion.   Eyes:  Negative for discharge and redness.  Respiratory:  Positive for cough and shortness of breath. Negative for sputum production and wheezing.   Cardiovascular:  Negative for chest pain, palpitations, orthopnea, claudication, leg swelling and PND.  Gastrointestinal:   Negative for abdominal pain, heartburn, nausea and vomiting.  Musculoskeletal:  Negative for falls and myalgias.  Skin:  Negative for rash.  Neurological:  Positive for weakness. Negative for dizziness, tingling, tremors, sensory change, speech change, focal weakness and loss of consciousness.  Endo/Heme/Allergies:  Does not bruise/bleed easily.  Psychiatric/Behavioral:  Negative for substance abuse. The patient is not nervous/anxious.   All other systems reviewed and are negative.     Physical Exam/Data:   Vitals:   08/31/23 0230 08/31/23 0500 08/31/23 0700 08/31/23 0730  BP: (!) 144/76 (!) 142/77 135/73 126/83  Pulse: 83 88 84 83  Resp: (!) 21 (!) 27 16 (!) 21  Temp: 98.2 F (36.8 C) 98 F (36.7 C)    TempSrc:      SpO2: 100% 100% 99% 100%    Intake/Output Summary (Last 24 hours) at 08/31/2023 1610 Last data filed at 08/30/2023 1748 Gross per 24 hour  Intake 1000 ml  Output --  Net 1000 ml   There were no vitals filed for this visit. There is no height or weight on file to calculate BMI.   Physical Exam: General: Well developed, well nourished, in no acute distress. Head: Normocephalic, atraumatic, sclera non-icteric, no xanthomas, nares without discharge.  Neck: Negative for carotid bruits. JVD difficult to assess secondary to body habitus. Lungs: Diminished and coarse breath sounds bilaterally especially along the bilateral bases. Breathing is unlabored on supplement oxygen.  Heart: RRR with S1 S2. I/VI systolic murmur RUSB, I/IV diastolic murmur at the apex, no rubs, or gallops appreciated. Abdomen: Soft, non-tender, non-distended with normoactive bowel sounds. No hepatomegaly. No rebound/guarding. No obvious abdominal masses. Msk:  Strength and tone appear normal for age. Extremities: No clubbing or cyanosis. Trivial bilateral pretibial edema. Distal pedal pulses are 2+ and equal bilaterally. Neuro: Alert and oriented X 3. No facial asymmetry. No focal deficit. Moves  all extremities spontaneously. Psych:  Responds to questions appropriately with a normal affect.   EKG:  The EKG was personally reviewed and demonstrates: NSR, 84 bpm, RBBB, prior inferior infarct, baseline artifact, consistent with prior tracings Telemetry:  Telemetry was personally reviewed and demonstrates: SR with PVCs and short atrial run  Weights: There were no vitals filed for this visit.  Relevant CV Studies:  2D echo 02/05/2023: 1. Left ventricular ejection fraction, by estimation, is 60 to 65%. The  left ventricle has normal function. The left ventricle has no regional  wall motion abnormalities. There is mild left ventricular hypertrophy.  Left ventricular diastolic parameters  are consistent with Grade I diastolic dysfunction (impaired relaxation).   2. Right ventricular systolic function is normal. The right ventricular  size is normal.   3. Left atrial size was moderately dilated.   4. The mitral valve is degenerative. Mild mitral valve regurgitation.  Moderate mitral stenosis. The mean mitral valve gradient is 8.0 mmHg.  Moderate mitral annular calcification.   5. The aortic valve is calcified. Aortic valve regurgitation is not  visualized. Mild aortic valve stenosis. Aortic valve mean gradient  measures 12.0 mmHg.   Comparison(s): EF 60%, normal MV, calcified AOV  with no stenosis.  __________  2D Dorena Cookey 01/12/2023: Normal sinus rhythm Patient had a min HR of 58 bpm, max HR of 176 bpm, and avg HR of 100 bpm.    2 Supraventricular Tachycardia runs occurred, the run with the fastest interval lasting 16 beats with a max rate of 176 bpm (avg 139 bpm); the run with the fastest interval was also the longest.    Isolated SVEs were rare (<1.0%), SVE Couplets were rare (<1.0%), and no SVE Triplets were present.  Isolated VEs were rare (<1.0%, 2178), VE Couplets were rare (<1.0%, 31), and VE Triplets were rare (<1.0%, 1).    No patient triggered events  reported __________  [facility] 09/19/2021: CONCLUSIONS: Severe diffuse calcified coronary disease of the native arteries with 100% proximal RCA, 90% ostial circumflex, 99% distal left main, severely and diffusely diseased proximal to mid LAD with threatened septal perforator. Diffusely diseased saphenous vein graft to the right coronary.  Ostial to proximal 70% stenosis. Widely patent sequential graft to first and second obtuse marginal Widely patent saphenous vein graft to the first diagonal Widely patent LIMA to the mid LAD Aortic valve is heavily calcified, was difficult to cross, but without significant gradient.  LVEDP 10 mmHg. Total contrast 70 cc   RECOMMENDATIONS:   Continue IV fluids for 18 to 24 hours.  He was given an additional 250 cc of saline as a bolus after identifying that LVEDP was 9 mmHg. Monitor kidney function closely.  Contrast exposure was 70 cc. The saphenous vein graft to the right coronary can be stented although it is not so critical that it should be causing enzyme elevation.  Enzyme elevation likely related to left coronary native disease in the septal perforator territory. __________  2D echo 09/18/2021: 1. Left ventricular ejection fraction, by estimation, is 60 to 65%. The  left ventricle has normal function. The left ventricle has no regional  wall motion abnormalities. Left ventricular diastolic parameters are  consistent with Grade I diastolic  dysfunction (impaired relaxation). Elevated left ventricular end-diastolic  pressure.   2. Right ventricular systolic function is moderately reduced. The right  ventricular size is normal.   3. The mitral valve is normal in structure. No evidence of mitral valve  regurgitation. No evidence of mitral stenosis.   4. The AV is poorly visualized to comment on structure. There does appear  to be come calcification of the valve. . The aortic valve was not well  visualized. Aortic valve regurgitation is not visualized. No  aortic  stenosis is present.   Laboratory Data:  Chemistry Recent Labs  Lab 08/30/23 1328 08/30/23 2049 08/31/23 0144  NA 140 140 140  K 4.6 4.5 4.3  CL 93* 96* 97*  CO2 31 28 28   GLUCOSE 224* 208* 293*  BUN 94* 88* 88*  CREATININE 4.46* 4.31* 4.29*  CALCIUM 9.5 9.0 8.8*  GFRNONAA 13* 13* 13*  ANIONGAP 16* 16* 15    Recent Labs  Lab 08/30/23 1328 08/31/23 0144  PROT 7.1 6.1*  ALBUMIN 3.0* 2.6*  AST 257* 140*  ALT 403* 284*  ALKPHOS 184* 145*  BILITOT 0.8 0.8   Hematology Recent Labs  Lab 08/28/23 0455 08/30/23 1328 08/31/23 0144  WBC 7.2 13.5* 12.7*  RBC 3.08* 3.35* 3.02*  HGB 10.1* 10.8* 9.8*  HCT 31.1* 34.4* 30.3*  MCV 101.0* 102.7* 100.3*  MCH 32.8 32.2 32.5  MCHC 32.5 31.4 32.3  RDW 15.0 14.8 14.7  PLT 292 331 274   Cardiac EnzymesNo results for  input(s): "TROPONINI" in the last 168 hours. No results for input(s): "TROPIPOC" in the last 168 hours.  BNP Recent Labs  Lab 08/30/23 1328  BNP 336.3*    DDimer No results for input(s): "DDIMER" in the last 168 hours.  Radiology/Studies:  US Abdomen Complete Result Date: 08/30/2023 IMPRESSION: 1. Diffuse fatty infiltration of the liver. 2. Polycystic kidneys without evidence of obstruction. Electronically Signed   By: Burman Nieves M.D.   On: 08/30/2023 20:25   CT CHEST WO CONTRAST Result Date: 08/30/2023 IMPRESSION: 1. Multilobar pneumonia with increased airspace consolidation at the right lung base and the posterior left lower lobe. 2. Similar small right and trace left pleural effusions. 3. Additional unchanged ancillary findings, as described above. Aortic Atherosclerosis (ICD10-I70.0). Electronically Signed   By: Hart Robinsons M.D.   On: 08/30/2023 17:03   DG Chest 2 View Result Date: 08/30/2023 IMPRESSION: 1. Persistent opacification over the left base likely infection with associated small left pleural effusion. Possible small amount right pleural fluid. 2. Stable cardiomegaly.  Electronically Signed   By: Elberta Fortis M.D.   On: 08/30/2023 13:43    Assessment and Plan:   1. CAD s/p CABG with elevated high-sensitivity troponin: -Never with symptoms of angina or cardiac decompensation -Mildly elevated and downtrending high-sensitivity troponin likely reflective of supply/demand ischemia in the setting of underlying CAD with acute hypoxic respiratory failure in the setting of multilobar pneumonia and underlying CKD stage IV -Reasonable to treat with IV heparin for total of 48 hours -Obtain echo -Would not pursue cardiac cath at this time given lack of anginal symptoms and severe renal dysfunction that would likely lead to hemodialysis, patient is in agreement -ASA and ezetimibe  2. HFpEF: -Suspect symptoms are largely pulmonary in etiology -Defer addition of diuretic at this time with progressive renal decline -Await echo  3.  Mitral stenosis: -Obtain echo -Outpatient follow-up -Unlikely to be contributing to symptoms at this time  4.  Acute hypoxic respiratory failure secondary to multilobar pneumonia and tracheobronchomalacia: -Management per primary service  5.  HTN: -Blood pressure is well-controlled at this time.  6.  HLD: -LDL 63 in 04/2023 -PTA ezetimibe  7.  OSA: -CPAP   Remaining comorbidities and abnormal findings per primary service      For questions or updates, please contact [facility] Please consult [email or web address] for contact info under Cardiology/STEMI.   Signed, Eula Listen, PA-C [facility] Pager: [phone] 08/31/2023, 8:19 AM

## 2023-08-31 NOTE — Consult Note (Signed)
Pharmacy Antibiotic Note  Kurt Aguilar is a 77 y.o. male with PMH including tracheobronchomalacia (previously diagnosed as BOOP) on chronic steroid therapy, CAD s/p CABG (1997), CKD stage IV, hypertension, hyperlipidemia, type 2 diabetes, OSA on CPAP, osteoporosis  admitted on 08/30/2023 with  acute respiratory failure .  Pharmacy has been consulted for vancomycin & cefepime dosing.  Plan:  Cefepime 2 g IV q24h  Vancomycin 1 g given yesterday and will give another vancomycin 1 g IV today. If therapy is continued will dose per levels given CKD IV and un-stable renal function --MRSA PCR pending, can d/c vancomycin per pulmonology if negative --Daily Scr while on vancomycin --Assess tomorrow for checking a level versus starting a maintenance regimen if therapy continued  Temp (24hrs), Avg:98.1 F (36.7 C), Min:97.6 F (36.4 C), Max:98.3 F (36.8 C)  Recent Labs  Lab 08/25/23 0442 08/26/23 0711 08/26/23 0825 08/27/23 0936 08/28/23 0455 08/30/23 1328 08/30/23 1547 08/30/23 2049 08/31/23 0142 08/31/23 0144  WBC 12.1*  --  12.3*  --  7.2 13.5*  --   --   --  12.7*  CREATININE 3.04*   < >  --  3.64* 3.73* 4.46*  --  4.31*  --  4.29*  LATICACIDVEN  --   --   --   --   --  3.4* 2.6*  --  2.3*  --    < > = values in this interval not displayed.    Estimated Creatinine Clearance: 17.9 mL/min (A) (by C-G formula based on SCr of 4.29 mg/dL (H)).    Allergies  Allergen Reactions   Oxycodone-Acetaminophen Other (See Comments)    Stops breathing; has tolerated Tylenol before   Oxycodone-Acetaminophen Anaphylaxis    Antimicrobials this admission: Cefepime 12/22 >>  Vancomycin 12/22 >>   Dose adjustments this admission: N/A  Microbiology results: 12/22 BCx: NGTD 12/22 RVP: (+) Rhinovirus 12/22 Sputum: GPC on gram stain, pending  12/23 MRSA PCR: pending  Thank you for allowing pharmacy to be a part of this patient's care.  Tressie Ellis 08/31/2023 12:59 PM

## 2023-08-31 NOTE — ED Notes (Signed)
Pt given breakfast tray

## 2023-08-31 NOTE — Progress Notes (Signed)
[facility]  ROUNDING NOTE   Subjective:   Kurt Aguilar is a 77 year old male with past medical conditions including hypertension, diabetes, hyperlipidemia, gout, CAD status post CABG in 1997, GERD, polycystic kidney disease, and chronic kidney disease stage IV.  Patient presented to the emergency department with shortness of breath and was admitted at that time for Acute hypoxic respiratory failure (HCC) [J96.01]  Patient is known to our practice and is followed outpatient by Dr. Thedore Mins.  Patient recently admitted for pneumonia.  Patient states that once he was discharged, he began having progressive shortness of breath.  States he went to the health clinic within his community and it was suggested he present to emergency department.  Currently on 4 L nasal cannula, appears comfortable.  Trace lower extremity edema noted.  Labs on ED arrival concerning for BUN 94, creatinine 4.46 with GFR 13, increased liver enzymes, troponin 341, BNP 336, lactic acid 3.4, elevated white count 13.5 with hemoglobin 10.8.  Respiratory panel negative for influenza, COVID-19, and RSV.  Patient is positive for rhinovirus.  Chest x-ray shows persistent opacification at the left base, likely infection.  We have been consulted to evaluate acute kidney injury.  Objective:  Vital signs in last 24 hours:  Temp:  [97.6 F (36.4 C)-98.3 F (36.8 C)] 98.3 F (36.8 C) (12/23 1030) Pulse Rate:  [82-95] 86 (12/23 1030) Resp:  [16-30] 26 (12/23 1030) BP: (123-163)/(73-94) 123/73 (12/23 1030) SpO2:  [97 %-100 %] 97 % (12/23 1030)  Weight change:  There were no vitals filed for this visit.   Intake/Output: I/O last 3 completed shifts: In: 1000 [IV Piggyback:1000] Out: -    Intake/Output this shift:  Total I/O In: -  Out: 250 [Urine:250]  Physical Exam: General: NAD, ill-appearing  Head: Normocephalic, atraumatic. Moist oral mucosal membranes  Eyes: Anicteric  Lungs:  Crackles, normal effort,  NCO 2  Heart: Regular rate and rhythm  Abdomen:  Soft, nontender, obese  Extremities: Trace to 1+ peripheral edema.  Neurologic: Alert and oriented, moving all four extremities  Skin: No lesions       Basic Metabolic Panel: Recent Labs  Lab 08/25/23 0442 08/25/23 1346 08/26/23 0711 08/27/23 0936 08/28/23 0455 08/30/23 1328 08/30/23 2049 08/31/23 0144  NA 141  --  142 142 143 140 140 140  K 5.4*   < > 4.6 4.3 4.6 4.6 4.5 4.3  CL 106  --  104 102 101 93* 96* 97*  CO2 23  --  24 27 27 31 28 28   GLUCOSE 103*  --  115* 146* 117* 224* 208* 293*  BUN 74*  --  79* 80* 86* 94* 88* 88*  CREATININE 3.04*  --  3.52* 3.64* 3.73* 4.46* 4.31* 4.29*  CALCIUM 8.7*  --  8.7* 8.8* 9.0 9.5 9.0 8.8*  MG 2.5*  --  2.5*  --  2.9*  --   --   --    < > = values in this interval not displayed.    Liver Function Tests: Recent Labs  Lab 08/30/23 1328 08/31/23 0144  AST 257* 140*  ALT 403* 284*  ALKPHOS 184* 145*  BILITOT 0.8 0.8  PROT 7.1 6.1*  ALBUMIN 3.0* 2.6*    No results for input(s): "LIPASE", "AMYLASE" in the last 168 hours. No results for input(s): "AMMONIA" in the last 168 hours.  CBC: Recent Labs  Lab 08/25/23 0442 08/26/23 0825 08/28/23 0455 08/30/23 1328 08/31/23 0144  WBC 12.1* 12.3* 7.2 13.5* 12.7*  NEUTROABS  --   --   --  10.8* 10.2*  HGB 11.4* 11.0* 10.1* 10.8* 9.8*  HCT 34.3* 33.7* 31.1* 34.4* 30.3*  MCV 97.7 100.0 101.0* 102.7* 100.3*  PLT 218 271 292 331 274    Cardiac Enzymes: No results for input(s): "CKTOTAL", "CKMB", "CKMBINDEX", "TROPONINI" in the last 168 hours.  BNP: Invalid input(s): "POCBNP"  CBG: Recent Labs  Lab 08/28/23 1557 08/30/23 1718 08/30/23 2208 08/31/23 0759 08/31/23 1139  GLUCAP 249* 199* 198* 223* 205*    Microbiology: Results for orders placed or performed during the hospital encounter of 08/30/23  Resp panel by RT-PCR (RSV, Flu A&B, Covid) Anterior Nasal Swab     Status: None   Collection Time: 08/30/23  1:28 PM    Specimen: Anterior Nasal Swab  Result Value Ref Range Status   SARS Coronavirus 2 by RT PCR NEGATIVE NEGATIVE Final    Comment: (NOTE) SARS-CoV-2 target nucleic acids are NOT DETECTED.  The SARS-CoV-2 RNA is generally detectable in upper respiratory specimens during the acute phase of infection. The lowest concentration of SARS-CoV-2 viral copies this assay can detect is 138 copies/mL. A negative result does not preclude SARS-Cov-2 infection and should not be used as the sole basis for treatment or other patient management decisions. A negative result may occur with  improper specimen collection/handling, submission of specimen other than nasopharyngeal swab, presence of viral mutation(s) within the areas targeted by this assay, and inadequate number of viral copies(<138 copies/mL). A negative result must be combined with clinical observations, patient history, and epidemiological information. The expected result is Negative.  Fact Sheet for Patients:  BloggerCourse.com  Fact Sheet for Healthcare Providers:  SeriousBroker.it  This test is no t yet approved or cleared by the [place] FDA and  has been authorized for detection and/or diagnosis of SARS-CoV-2 by FDA under an Emergency Use Authorization (EUA). This EUA will remain  in effect (meaning this test can be used) for the duration of the COVID-19 declaration under Section 564(b)(1) of the Act, 21 U.S.C.section 360bbb-3(b)(1), unless the authorization is terminated  or revoked sooner.       Influenza A by PCR NEGATIVE NEGATIVE Final   Influenza B by PCR NEGATIVE NEGATIVE Final    Comment: (NOTE) The Xpert Xpress SARS-CoV-2/FLU/RSV plus assay is intended as an aid in the diagnosis of influenza from Nasopharyngeal swab specimens and should not be used as a sole basis for treatment. Nasal washings and aspirates are unacceptable for Xpert Xpress  SARS-CoV-2/FLU/RSV testing.  Fact Sheet for Patients: BloggerCourse.com  Fact Sheet for Healthcare Providers: SeriousBroker.it  This test is not yet approved or cleared by the [place] FDA and has been authorized for detection and/or diagnosis of SARS-CoV-2 by FDA under an Emergency Use Authorization (EUA). This EUA will remain in effect (meaning this test can be used) for the duration of the COVID-19 declaration under Section 564(b)(1) of the Act, 21 U.S.C. section 360bbb-3(b)(1), unless the authorization is terminated or revoked.     Resp Syncytial Virus by PCR NEGATIVE NEGATIVE Final    Comment: (NOTE) Fact Sheet for Patients: BloggerCourse.com  Fact Sheet for Healthcare Providers: SeriousBroker.it  This test is not yet approved or cleared by the [place] FDA and has been authorized for detection and/or diagnosis of SARS-CoV-2 by FDA under an Emergency Use Authorization (EUA). This EUA will remain in effect (meaning this test can be used) for the duration of the COVID-19 declaration under Section 564(b)(1) of the Act, 21 U.S.C. section 360bbb-3(b)(1), unless the authorization is terminated or revoked.  Performed  at [facility], [address] [place], [place] [address]   Blood Culture (routine x 2)     Status: None (Preliminary result)   Collection Time: 08/30/23  2:02 PM   Specimen: BLOOD  Result Value Ref Range Status   Specimen Description BLOOD BLOOD RIGHT ARM  Final   Special Requests   Final    BOTTLES DRAWN AEROBIC AND ANAEROBIC Blood Culture adequate volume   Culture   Final    NO GROWTH < 24 HOURS Performed at [facility], [address] [place], [place] [address]    Report Status PENDING  Incomplete  Blood Culture (routine x 2)     Status: None (Preliminary result)   Collection Time: 08/30/23  2:02 PM   Specimen: BLOOD   Result Value Ref Range Status   Specimen Description BLOOD BLOOD RIGHT ARM  Final   Special Requests   Final    BOTTLES DRAWN AEROBIC AND ANAEROBIC Blood Culture adequate volume   Culture   Final    NO GROWTH < 24 HOURS Performed at [facility], [address] [place], [place] [address]    Report Status PENDING  Incomplete  Respiratory (~20 pathogens) panel by PCR     Status: Abnormal   Collection Time: 08/30/23  3:47 PM   Specimen: Nasopharyngeal Swab; Respiratory  Result Value Ref Range Status   Adenovirus NOT DETECTED NOT DETECTED Final   Coronavirus 229E NOT DETECTED NOT DETECTED Final    Comment: (NOTE) The Coronavirus on the Respiratory Panel, DOES NOT test for the novel  Coronavirus (2019 nCoV)    Coronavirus HKU1 NOT DETECTED NOT DETECTED Final   Coronavirus NL63 NOT DETECTED NOT DETECTED Final   Coronavirus OC43 NOT DETECTED NOT DETECTED Final   Metapneumovirus NOT DETECTED NOT DETECTED Final   Rhinovirus / Enterovirus DETECTED (A) NOT DETECTED Final   Influenza A NOT DETECTED NOT DETECTED Final   Influenza B NOT DETECTED NOT DETECTED Final   Parainfluenza Virus 1 NOT DETECTED NOT DETECTED Final   Parainfluenza Virus 2 NOT DETECTED NOT DETECTED Final   Parainfluenza Virus 3 NOT DETECTED NOT DETECTED Final   Parainfluenza Virus 4 NOT DETECTED NOT DETECTED Final   Respiratory Syncytial Virus NOT DETECTED NOT DETECTED Final   Bordetella pertussis NOT DETECTED NOT DETECTED Final   Bordetella Parapertussis NOT DETECTED NOT DETECTED Final   Chlamydophila pneumoniae NOT DETECTED NOT DETECTED Final   Mycoplasma pneumoniae NOT DETECTED NOT DETECTED Final    Comment: Performed at [facility], [address], [place], [place] [address]  Expectorated Sputum Assessment w Gram Stain, Rflx to Resp Cult     Status: None   Collection Time: 08/30/23  4:25 PM   Specimen: Sputum  Result Value Ref Range Status   Specimen Description SPUTUM  Final   Special Requests  NONE  Final   Sputum evaluation   Final    THIS SPECIMEN IS ACCEPTABLE FOR SPUTUM CULTURE Performed at [facility], [address] [place], [place] [address]    Report Status 08/30/2023 FINAL  Final  Culture, Respiratory w Gram Stain     Status: None (Preliminary result)   Collection Time: 08/30/23  4:25 PM   Specimen: SPU  Result Value Ref Range Status   Specimen Description   Final    SPUTUM Performed at [facility], [address] [place], [place] [address]    Special Requests   Final    NONE Reflexed from [id number] Performed  at [facility], [address] [place], [place] [address]    Gram Stain   Final    RARE SQUAMOUS EPITHELIAL CELLS PRESENT FEW WBC PRESENT,BOTH PMN AND MONONUCLEAR FEW GRAM POSITIVE COCCI IN PAIRS RARE GRAM POSITIVE COCCI IN CLUSTERS    Culture   Final    TOO YOUNG TO READ Performed at [facility], [address] [place], [place] [address]    Report Status PENDING  Incomplete    Coagulation Studies: Recent Labs    08/30/23 1328  LABPROT 15.1  INR 1.2    Urinalysis: Recent Labs    08/30/23 1626  COLORURINE STRAW*  LABSPEC 1.006  PHURINE 5.0  GLUCOSEU NEGATIVE  HGBUR SMALL*  BILIRUBINUR NEGATIVE  KETONESUR NEGATIVE  PROTEINUR NEGATIVE  NITRITE NEGATIVE  LEUKOCYTESUR NEGATIVE      Imaging: NM Pulmonary Perfusion Result Date: 08/31/2023 CLINICAL DATA:  Worsening shortness of breath. EXAM: NUCLEAR MEDICINE PERFUSION LUNG SCAN TECHNIQUE: Perfusion images were obtained in multiple projections after intravenous injection of radiopharmaceutical. Ventilation scans intentionally deferred if perfusion scan and chest x-ray adequate for interpretation during COVID 19 epidemic. RADIOPHARMACEUTICALS:  4.28 mCi Tc-80m MAA IV COMPARISON:  Chest CT and radiograph 08/30/2023 FINDINGS: Comparison chest radiograph demonstrates airspace opacities in the left lower lobe. Perfusion imaging demonstrates no segmental or  subsegmental perfusion abnormalities. Perfusion defect in the left lower lobe corresponds to airspace opacity seen on prior imaging. IMPRESSION: Nonsegmental perfusion defect correlating to airspace opacities seen on prior imaging. Low probability of pulmonary embolism. Electronically Signed   By: Minerva Fester M.D.   On: 08/31/2023 10:42   US Abdomen Complete Result Date: 08/30/2023 CLINICAL DATA:  Acute kidney injury. Elevated liver function studies. EXAM: ABDOMEN ULTRASOUND COMPLETE COMPARISON:  CT abdomen and pelvis 08/17/2022 FINDINGS: Gallbladder: No gallstones or wall thickening visualized. No sonographic Murphy sign noted by sonographer. Common bile duct: Diameter: 3 mm, normal Liver: Diffusely increased hepatic parenchymal echotexture suggesting fatty infiltration. Subcentimeter cysts. Portal vein is patent on color Doppler imaging with normal direction of blood flow towards the liver. IVC: No abnormality visualized. Pancreas: Visualized portion unremarkable. Spleen: Size and appearance within normal limits. Right Kidney: Length: 15.3 cm. Multiple parenchymal cysts are demonstrated with benign appearance. Largest measures 6.1 cm maximal diameter. Changes are consistent with polycystic kidney. No hydronephrosis. No solid mass identified. No imaging follow-up is indicated. Left Kidney: Length: 12.5 cm. Multiple parenchymal cysts are demonstrated with benign appearance. Largest measures 4.8 cm in maximal diameter. Changes are consistent with polycystic kidney. No hydronephrosis or solid mass identified. No follow-up imaging is indicated. Abdominal aorta: No aneurysm visualized. Other findings: Examination is somewhat technically limited due to body habitus, bowel gas, and limited mobility. IMPRESSION: 1. Diffuse fatty infiltration of the liver. 2. Polycystic kidneys without evidence of obstruction. Electronically Signed   By: Burman Nieves M.D.   On: 08/30/2023 20:25   CT CHEST WO CONTRAST Result  Date: 08/30/2023 CLINICAL DATA:  Recent history of pneumonia/sepsis. Productive cough. EXAM: CT CHEST WITHOUT CONTRAST TECHNIQUE: Multidetector CT imaging of the chest was performed following the standard protocol without IV contrast. RADIATION DOSE REDUCTION: This exam was performed according to the departmental dose-optimization program which includes automated exposure control, adjustment of the mA and/or kV according to patient size and/or use of iterative reconstruction technique. COMPARISON:  CT chest dated August 22, 2023. FINDINGS: Cardiovascular: Normal heart size. No pericardial effusion. Atherosclerosis of the thoracic aorta and arch branch vessels. Multivessel coronary artery calcifications. Aortic valve and mitral  annulus calcifications. Prior CABG. Mediastinum/Nodes: Redemonstration of multiple prominent mediastinal lymph nodes, similar to slightly decreased in size compared to the prior exam. Calcified mediastinal and right hilar granulomatous nodes. Secretions noted in the left mainstem bronchus. Thyroid gland and esophagus demonstrate no significant abnormality. Lungs/Pleura: Similar small right and trace left pleural effusions. Calcified pleural plaques in the left hemithorax. Patchy bilateral multifocal peripheral predominant consolidation is again noted with increased consolidative changes at the right lung base and the superior aspect of the posterior left lower lobe. No pneumothorax. Upper Abdomen: No acute abnormality. Musculoskeletal: Intact median sternotomy wires. No suspicious osseous lesion. No acute osseous abnormality. IMPRESSION: 1. Multilobar pneumonia with increased airspace consolidation at the right lung base and the posterior left lower lobe. 2. Similar small right and trace left pleural effusions. 3. Additional unchanged ancillary findings, as described above. Aortic Atherosclerosis (ICD10-I70.0). Electronically Signed   By: Hart Robinsons M.D.   On: 08/30/2023 17:03   DG  Chest 2 View Result Date: 08/30/2023 CLINICAL DATA:  Cough and dyspnea. Recent hospitalization for pneumonia/sepsis. EXAM: CHEST - 2 VIEW COMPARISON:  08/22/2023 FINDINGS: Lungs are hypoinflated demonstrate persistent opacification over the left base likely infection with associated small left pleural effusion. Possible small amount right pleural fluid. Calcified pleural plaques over the lateral left upper lung/apex. Mild stable cardiomegaly. Remainder of the exam is unchanged. IMPRESSION: 1. Persistent opacification over the left base likely infection with associated small left pleural effusion. Possible small amount right pleural fluid. 2. Stable cardiomegaly. Electronically Signed   By: Elberta Fortis M.D.   On: 08/30/2023 13:43     Medications:    ceFEPime (MAXIPIME) IV     heparin 1,000 Units/hr (08/31/23 1325)   vancomycin 1,000 mg (08/31/23 1322)     aspirin EC  81 mg Oral Daily   cyanocobalamin  500 mcg Oral Daily   ezetimibe  10 mg Oral Daily   famotidine  20 mg Oral QHS   gabapentin  100 mg Oral QHS   insulin aspart  0-15 Units Subcutaneous TID WC   ipratropium-albuterol  3 mL Nebulization Q6H   predniSONE  40 mg Oral Q breakfast   senna-docusate  1 tablet Oral QHS   sodium chloride flush  3 mL Intravenous Q12H   vancomycin variable dose per unstable renal function (pharmacist dosing)   Does not apply See admin instructions   acetaminophen **OR** acetaminophen, nitroGLYCERIN, ondansetron **OR** ondansetron (ZOFRAN) IV, polyethylene glycol  Assessment/ Plan:  Kurt Aguilar is a 77 y.o.  male with past medical conditions including hypertension, diabetes, hyperlipidemia, gout, CAD status post CABG in 1997, GERD, polycystic kidney disease, and chronic kidney disease stage IV.  Patient presented to the emergency department with shortness of breath and was admitted at that time for Acute hypoxic respiratory failure (HCC) [J96.01]   Acute Kidney Injury on chronic kidney  disease stage IV with baseline creatinine 2.23 on 04/15/23.  Acute kidney injury secondary to volume overload.  IV contrast exposure on 08/31/2023. Chronic kidney disease is multifactorial: Diabetes and polycystic kidney disease, hypertension, obesity Patient recently discharged with creatinine 3.73, currently admitted with creatinine 4.46.  Diuretics currently held.  No acute indication for dialysis.  Awaiting cardiac workup.   Lab Results  Component Value Date   CREATININE 4.29 (H) 08/31/2023   CREATININE 4.31 (H) 08/30/2023   CREATININE 4.46 (H) 08/30/2023    Intake/Output Summary (Last 24 hours) at 08/31/2023 1334 Last data filed at 08/31/2023 1245 Gross per 24 hour  Intake  1000 ml  Output 250 ml  Net 750 ml    2. Anemia of chronic kidney disease Lab Results  Component Value Date   HGB 9.8 (L) 08/31/2023    Hgb at goal.   3. Secondary Hyperparathyroidism: with outpatient labs: PTH 73, phosphorus 4.2, calcium 9.7 on 08/18/23.   Lab Results  Component Value Date   CALCIUM 8.8 (L) 08/31/2023   CAION 1.15 09/17/2021   PHOS 3.4 09/12/2016    Patient currently prescribed calcium carbonate with vitamin D outpatient.  Will continue to monitor bone minerals during this admission  4. Diabetes mellitus type II with chronic kidney disease/renal manifestations: insulin dependent. Home regimen includes semaglutide, Novolin 70/30 and lispro. Most recent hemoglobin A1c is 6.3 on 05/08/23.   Glucose elevated at times.  Primary team to manage SSI    LOS: 1 Bita Cartwright 12/23/20241:34 PM

## 2023-08-31 NOTE — Progress Notes (Signed)
PROGRESS NOTE    Kurt Aguilar  VFI:[id number] DOB: [birth year] DOA: 08/30/2023 PCP: Judy Pimple, MD  [id number]  LOS: 1 day   Brief hospital course:   Assessment & Plan:  Kurt Aguilar is a 77 y.o. 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   Kurt Aguilar was recently admitted from 12/12 to 12/20 for acute hypoxic respiratory failure in the setting of tracheobronchomalacia and suspected LLL pneumonia + AKI on CKD 2/2 contrast exposure.  He was discharged to SNF on 12/20.   * Acute hypoxic respiratory failure (HCC) 2/2 Rhinovirus pneumonia with superimposed possible bacterial pneumonia Patient is presenting with AMS morning of presentation in the setting of hypoxia that resolved on placement of supplemental oxygen.  He was recently admitted for pneumonia and received 5 days of meropenem and azithromycin with additional 2 days of Rocephin.  Pulmonology has been consulted and recommending treatment for potential hospital associated pneumonia.  Hypoxia may also be related to underlying NSTEMI/HF given slightly enlarged pleural effusions in addition to ongoing tracheobronchomalacia. --cont vanc and cefepime, per pulm --increase prednisone to 40 mg daily  Trop elevation 2/2 demand ischemia NSTEMI ruled out --trop peaked at 341.  Echo without wall motion abnormalities.    Tracheobronchomalacia Per chart review, patient has a previous history of BOOP with evaluation by pulmonology on last admission noting clinically and radiologically more consistent with tracheobronchomalacia. --has been on chronic prednisone 20 mg daily for many years. - Pulmonology consulted --increase prednisone to 40 mg daily  Chronic heart failure with preserved ejection  fraction (HCC) Although patient does not appear significantly hypervolemic on examination, he is noted to have slightly increased pleural effusions on CT imaging and his BNP is increased compared to prior at 336.  Given AKI, he received small IV bolus of fluids.  Will hold off on diuretics at this time and recheck renal function in the evening.  If evidence of worsening renal function, will trial a dose of Lasix, given the possibility this may be cardiorenal in nature. --hold off diuretics given renal decline  Acute kidney injury superimposed on chronic kidney disease 4 (HCC) Patient has a history of CKD stage IV With AKI on most recent admission.  On discharge, his creatinine was noted to be slowly rising as high as 3.73, currently 4.46.   - Nephrology consulted --hold diuretics  Elevated LFTs Unclear etiology at this time.  No prior history of cirrhosis, and INR and platelets are within normal limits.  He is not experiencing any abdominal pain to suggest cholecystitis.  No reported alcohol use.   - Right upper quadrant ultrasound no acute finding - Hold home statin  Steroid dependent (HCC) --has been on chronic prednisone 20 mg daily for many years. --increase prednisone to 40 mg daily, per pulm rec  Type 2 diabetes mellitus (HCC) - Hold home antiglycemic agents - SSI, moderate  Neuropathy - Decrease home gabapentin at bedtime from 300 mg to 100 mg given AKI  Essential hypertension - Hold home antihypertensives in the setting of high risk for hypotension.  Obstructive sleep apnea - CPAP at bedtime   DVT prophylaxis: [id number] gtt Code Status: DNR  Family Communication: daughter updated at bedside today Level of care: Progressive Dispo:   The patient is from: SNF rehab Anticipated d/c is to:  SNF rehab Anticipated d/c date is: > 3 days   Subjective and Interval History:  Pt reported some chest pain from cough.  Had difficulty sleeping at night.  Still making urine.  No  increased swelling.   Objective: Vitals:   08/31/23 1030 08/31/23 1330 08/31/23 1526 08/31/23 1630  BP: 123/73 116/72  116/63  Pulse: 86 80  91  Resp: (!) 26 20  20   Temp: 98.3 F (36.8 C)  98 F (36.7 C)   TempSrc: Oral  Oral   SpO2: 97% 100%  97%    Intake/Output Summary (Last 24 hours) at 08/31/2023 1752 Last data filed at 08/31/2023 1607 Gross per 24 hour  Intake 301.87 ml  Output 250 ml  Net 51.87 ml   There were no vitals filed for this visit.  Examination:   Constitutional: NAD, AAOx3 HEENT: conjunctivae and lids normal, EOMI CV: No cyanosis.   RESP: normal respiratory effort Extremities: No effusions, edema in BLE SKIN: warm, dry Neuro: II - XII grossly intact.   Psych: Normal mood and affect.  Appropriate judgement and reason   Data Reviewed: I have personally reviewed labs and imaging studies  Time spent: 50 minutes  Darlin Priestly, MD [facility] If 7PM-7AM, please contact night-coverage 08/31/2023, 5:52 PM

## 2023-08-31 NOTE — Consult Note (Signed)
PHARMACY - ANTICOAGULATION CONSULT NOTE  Pharmacy Consult for Heparin Infusion Indication: chest pain/ACS  Patient Measurements: Hight: 70 inches Wt 117.3 kg IBW 73 kg Heparin Dosing Weight: 99 kg  Labs: Recent Labs    08/30/23 1328 08/30/23 2049 08/31/23 0142 08/31/23 0144 08/31/23 1145 08/31/23 2211  HGB 10.8*  --   --  9.8*  --   --   HCT 34.4*  --   --  30.3*  --   --   PLT 331  --   --  274  --   --   APTT 25  --   --   --   --   --   LABPROT 15.1  --   --   --   --   --   INR 1.2  --   --   --   --   --   HEPARINUNFRC  --   --  0.77*  --  0.72* 0.68  CREATININE 4.46* 4.31*  --  4.29*  --   --   TROPONINIHS 341* 275* 232*  --   --   --     Estimated Creatinine Clearance: 17.9 mL/min (A) (by C-G formula based on SCr of 4.29 mg/dL (H)).   Medical History: Past Medical History:  Diagnosis Date   Arthritis    Chronic kidney disease    Complication of anesthesia    constipation and inability to  urinate happened a few days after cataract surgery    Diabetes (HCC)    type 2    Diverticulitis    GERD (gastroesophageal reflux disease)    Gout    Heart attack (HCC)    Heart disease    Heart murmur    Hyperglycemia    Hyperlipidemia    Hypertension    Osteoporosis    Pneumonia    hx of BOOP followed by Dr Sherene Sires    Sleep apnea    cpapp - setting at 17    Medications:  No AC prior to admission per med list and fill history  Baseline CBC appropriate to start heparin infusion. Baseline INR = 1.2, Aptt 25  Assessment: Patient admitted to ER with shortness of breath. PMH includes hypertension, diabetes, hyperlipidemia, gout, CAD status post CABG in 1997, GERD, polycystic kidney disease, and chronic kidney disease stage IV. Also noted dx of idiopathic pulmonary disease with recent discharge from [facility] on 12/20.  Goal of Therapy:  Heparin level 0.3-0.7 units/ml Monitor platelets by anticoagulation protocol: Yes   Plan:  --12/23 1145 = 0.72,  supratherapeutic --12/23 2211 HL 0.68, therapeutic x 1   --Continue heparin infusion at 1000 units/hr --Re-check HL w/ AM labs to confirm --Daily CBC per protocol while on IV heparin  Otelia Sergeant, PharmD, [facility] 08/31/2023 10:55 PM

## 2023-08-31 NOTE — Consult Note (Signed)
PHARMACY - ANTICOAGULATION CONSULT NOTE  Pharmacy Consult for heparin infusion Indication: chest pain/ACS  Allergies  Allergen Reactions   Oxycodone-Acetaminophen Other (See Comments)    Stops breathing; has tolerated Tylenol before   Oxycodone-Acetaminophen Anaphylaxis    Patient Measurements: Hight: 70 inches Wt 117.3 kg IBW 73 kg Heparin Dosing Weight: 99 kg  Vital Signs: Temp: 98.3 F (36.8 C) (12/22 2300) Temp Source: Oral (12/22 2044) BP: 144/76 (12/23 0230) Pulse Rate: 83 (12/23 0230)  Labs: Recent Labs    08/28/23 0455 08/30/23 1328 08/30/23 2049 08/31/23 0142 08/31/23 0144  HGB 10.1* 10.8*  --   --  9.8*  HCT 31.1* 34.4*  --   --  30.3*  PLT 292 331  --   --  274  APTT  --  25  --   --   --   LABPROT  --  15.1  --   --   --   INR  --  1.2  --   --   --   HEPARINUNFRC  --   --   --  0.77*  --   CREATININE 3.73* 4.46* 4.31*  --  4.29*  TROPONINIHS  --  341* 275* 232*  --     Estimated Creatinine Clearance: 17.9 mL/min (A) (by C-G formula based on SCr of 4.29 mg/dL (H)).   Medical History: Past Medical History:  Diagnosis Date   Arthritis    Chronic kidney disease    Complication of anesthesia    constipation and inability to  urinate happened a few days after cataract surgery    Diabetes (HCC)    type 2    Diverticulitis    GERD (gastroesophageal reflux disease)    Gout    Heart attack (HCC)    Heart disease    Heart murmur    Hyperglycemia    Hyperlipidemia    Hypertension    Osteoporosis    Pneumonia    hx of BOOP followed by Dr Sherene Sires    Sleep apnea    cpapp - setting at 17     Medications:  No AC prior to admission per med list and fill history  Baseline CBC appropriate to start heparin infusion. Baseline INR = 1.2, Aptt 25  Assessment: Patient admitted to ER with shortness of breath. PMH includes hypertension, diabetes, hyperlipidemia, gout, CAD status post CABG in 1997, GERD, polycystic kidney disease, and chronic kidney disease  stage IV. Also noted dx of idiopathic pulmonary disease with recent discharge from [facility] on 12/20.  Goal of Therapy:  Heparin level 0.3-0.7 units/ml Monitor platelets by anticoagulation protocol: Yes   Plan:  12/23:  HL @ 0142 = 0.77, elevated - Will decrease heparin drip rate to 1100 units/hr and recheck HL 8 hrs after rate change.   Rameen Quinney D 08/31/2023 2:46 AM

## 2023-09-01 DIAGNOSIS — J9601 Acute respiratory failure with hypoxia: Secondary | ICD-10-CM | POA: Diagnosis not present

## 2023-09-01 LAB — GLUCOSE, CAPILLARY
Glucose-Capillary: 186 mg/dL — ABNORMAL HIGH (ref 70–99)
Glucose-Capillary: 263 mg/dL — ABNORMAL HIGH (ref 70–99)
Glucose-Capillary: 220 mg/dL — ABNORMAL HIGH (ref 70–99)
Glucose-Capillary: 309 mg/dL — ABNORMAL HIGH (ref 70–99)

## 2023-09-01 LAB — BASIC METABOLIC PANEL
Anion gap: 16 — ABNORMAL HIGH (ref 5–15)
BUN: 86 mg/dL — ABNORMAL HIGH (ref 8–23)
CO2: 24 mmol/L (ref 22–32)
Calcium: 8.4 mg/dL — ABNORMAL LOW (ref 8.9–10.3)
Chloride: 97 mmol/L — ABNORMAL LOW (ref 98–111)
Creatinine, Ser: 3.83 mg/dL — ABNORMAL HIGH (ref 0.61–1.24)
GFR, Estimated: 15 mL/min — ABNORMAL LOW (ref >60)
Glucose, Bld: 225 mg/dL — ABNORMAL HIGH (ref 70–99)
Potassium: 3.8 mmol/L (ref 3.5–5.1)
Sodium: 137 mmol/L (ref 135–145)

## 2023-09-01 LAB — HEPARIN LEVEL (UNFRACTIONATED): Heparin Unfractionated: 0.64 [IU]/mL (ref 0.30–0.70)

## 2023-09-01 LAB — CBC
HCT: 29.4 % — ABNORMAL LOW (ref 39.0–52.0)
Hemoglobin: 9.6 g/dL — ABNORMAL LOW (ref 13.0–17.0)
MCH: 32.4 pg (ref 26.0–34.0)
MCHC: 32.7 g/dL (ref 30.0–36.0)
MCV: 99.3 fL (ref 80.0–100.0)
Platelets: 266 10*3/uL (ref 150–400)
RBC: 2.96 MIL/uL — ABNORMAL LOW (ref 4.22–5.81)
RDW: 14.9 % (ref 11.5–15.5)
WBC: 14 10*3/uL — ABNORMAL HIGH (ref 4.0–10.5)
nRBC: 0.4 % — ABNORMAL HIGH (ref 0.0–0.2)

## 2023-09-01 LAB — LEGIONELLA PNEUMOPHILA SEROGP 1 UR AG: L. pneumophila Serogp 1 Ur Ag: NEGATIVE

## 2023-09-01 LAB — MRSA NEXT GEN BY PCR, NASAL: MRSA by PCR Next Gen: NOT DETECTED

## 2023-09-01 LAB — MAGNESIUM: Magnesium: 2.2 mg/dL (ref 1.7–2.4)

## 2023-09-01 MED ORDER — SODIUM CHLORIDE 3 % IN NEBU
4.0000 mL | INHALATION_SOLUTION | Freq: Two times a day (BID) | RESPIRATORY_TRACT | Status: AC
  Administered 2023-09-01 – 2023-09-04 (×4): 4 mL via RESPIRATORY_TRACT
  Filled 2023-09-01 (×6): qty 4

## 2023-09-01 MED ORDER — IPRATROPIUM-ALBUTEROL 0.5-2.5 (3) MG/3ML IN SOLN
3.0000 mL | Freq: Two times a day (BID) | RESPIRATORY_TRACT | Status: DC
  Administered 2023-09-01 – 2023-09-06 (×9): 3 mL via RESPIRATORY_TRACT
  Filled 2023-09-01 (×10): qty 3

## 2023-09-01 MED ORDER — HEPARIN SODIUM (PORCINE) 5000 UNIT/ML IJ SOLN
5000.0000 [IU] | Freq: Three times a day (TID) | INTRAMUSCULAR | Status: DC
  Administered 2023-09-01 – 2023-09-06 (×14): 5000 [IU] via SUBCUTANEOUS
  Filled 2023-09-01 (×15): qty 1

## 2023-09-01 NOTE — Progress Notes (Signed)
NAME:  Kurt Aguilar, MRN:  [id number], DOB:  02-13-1946, LOS: 2 ADMISSION DATE:  08/30/2023  History of Present Illness:  Kurt Aguilar is a 77 year old male patient with a past medical history of CAD status post CABG in 1997, moderate mitral valve stenosis, severe tracheobronchomalacia, CKD stage IV, diabetes mellitus type 2, hypertension, hyperlipidemia and OSA on CPAP who is presenting to [facility] on 12/22 with acute hypoxic respiratory failure.  He was recently admitted to [facility] few days ago with acute hypoxic respiratory failure.  Symptoms started few days before his initial presentation with fever up to 101 after coming back from a cruise.  He was treated for community-acquired pneumonia with meropenem vancomycin and azithromycin.  He was discharged home on oxygen.  He comes back now with ongoing hypoxic respiratory failure worsening cough with sputum production.  CT chest with worsening multilobar pneumonia specifically at the right lung base in the posterior left lower lobe.  Respiratory viral panel 12/22 positive for rhinovirus.  Sputum culture 12/22 with few gram-positive cocci in pairs and clusters.  Aspergillus antigen 12/14 negative.  Beta D glucan pending, ANA Pending, ANCA Pending, RF pending   He is also being managed and evaluated for NSTEMI.  Also found with worsening kidney function from a baseline of 2.2 mg/dL now with creatinine of 4.3 mg/dL.  Echocardiogram 12/23 - Normal EF 60%, no rwma, RV is normal, PASP normal, no significant mitral valve disease.   Pertinent  Medical History  As above.  Interim History / Subjective:  Sitting in chair eating, good appetite. Still has significant dyspnea on exertion.   Objective   Blood pressure 104/74, pulse 76, temperature 97.6 F (36.4 C), temperature source Oral, resp. rate (!) 22, height 5\' 7"  (1.702 m), weight 108.5 kg, SpO2 99%.        Intake/Output Summary (Last 24  hours) at 09/01/2023 1415 Last data filed at 09/01/2023 0700 Gross per 24 hour  Intake 358.7 ml  Output 400 ml  Net -41.3 ml   Filed Weights   09/01/23 0229  Weight: 108.5 kg    GEN no acute distress HEENT supple neck, reactive pupils Pulmonary faint wheezing heard bilaterally CVS normal S1, normal S2, regular rate and rhythm Abdomen soft nontender nondistended positive bowel sounds Extremities warm well-perfused no edema  Sputum culture with moderate staph epidermidis. MRSA negative.   Assessment & Plan:  Case of a 77 year old male patient chronically immunosuppressed on steroid for presumed OP presenting for worsening shortness of breath found to have rhinovirus + and few gram + Cocci in clusters in sputum.   #Acute hypoxic respiratory failure secondary to... #Rhinovirus pneumonia with superimposed possible bacterial pneumonia. Opportunistic infection is not ruled out., Pending fungitell. Asp Ag negative.  #TBM (EDAC) #NSTEMI likelt type II pending eval, currently on heparin drip.  #AKI on CKD in the setting of sepsis?   Plan  []  Ok to dc Vancomycin. Cotinue with Cefepime for a total fo 5 days. []  prednisone to 40mg  PO daily for now might need a prolonged taper to baseline.  []  Follow up Beta-glucan, ANCA, ANA, RF.  []  Pulmonary hygiene with 3% hypertonic saline neb preceded by Albuterol neb Q8H. Followed by Flutter valve use and IS. Consider cpap 5-15 at bedtime.   I spent 50 minutes caring for this patient today, including preparing to see the patient, obtaining a medical history , reviewing a separately obtained history, performing a medically appropriate examination and/or evaluation, counseling  and educating the patient/family/caregiver, documenting clinical information in the electronic health record, and independently interpreting results (not separately reported/billed) and communicating results to the patient/family/caregiver  Janann Colonel, MD [facility] 09/01/2023 2:15 PM

## 2023-09-01 NOTE — Progress Notes (Signed)
[facility]  ROUNDING NOTE   Subjective:   Kurt Aguilar is a 77 year old male with past medical conditions including hypertension, diabetes, hyperlipidemia, gout, CAD status post CABG in 1997, GERD, polycystic kidney disease, and chronic kidney disease stage IV.  Patient presented to the emergency department with shortness of breath and was admitted at that time for Transaminitis [R74.01] AKI (acute kidney injury) (HCC) [N17.9] HCAP (healthcare-associated pneumonia) [J18.9] Sepsis, due to unspecified organism, unspecified whether acute organ dysfunction present (HCC) [A41.9] Acute hypoxic respiratory failure (HCC) [J96.01]  Patient is known to our practice and is followed outpatient by Dr. Thedore Mins.  Patient recently admitted for pneumonia.   Patient seen sitting up in bed Wife at bedside Breathing appears labored however patient currently completing ADLs with technician at bedside Remains on nasal cannula, 4 L  Creatinine 3.83 from 4.29 Urine output 650 mL  Objective:  Vital signs in last 24 hours:  Temp:  [97.5 F (36.4 C)-98 F (36.7 C)] 97.6 F (36.4 C) (12/24 0302) Pulse Rate:  [50-105] 76 (12/24 1200) Resp:  [15-29] 22 (12/24 1200) BP: (104-134)/(63-93) 104/74 (12/24 1200) SpO2:  [89 %-100 %] 99 % (12/24 1200) Weight:  [108.5 kg] 108.5 kg (12/24 0229)  Weight change:  Filed Weights   09/01/23 0229  Weight: 108.5 kg     Intake/Output: I/O last 3 completed shifts: In: 358.7 [I.V.:56.8; IV Piggyback:301.9] Out: 650 [Urine:650]   Intake/Output this shift:  No intake/output data recorded.  Physical Exam: General: NAD, ill-appearing  Head: Normocephalic, atraumatic. Moist oral mucosal membranes  Eyes: Anicteric  Lungs:  Basilar crackles, normal effort, NCO 2  Heart: Regular rate and rhythm  Abdomen:  Soft, nontender, obese  Extremities:  1+ peripheral edema.  Neurologic: Alert and oriented, moving all four extremities  Skin: No lesions        Basic Metabolic Panel: Recent Labs  Lab 08/26/23 0711 08/27/23 0936 08/28/23 0455 08/30/23 1328 08/30/23 2049 08/31/23 0144 09/01/23 0537  NA 142   < > 143 140 140 140 137  K 4.6   < > 4.6 4.6 4.5 4.3 3.8  CL 104   < > 101 93* 96* 97* 97*  CO2 24   < > 27 31 28 28 24   GLUCOSE 115*   < > 117* 224* 208* 293* 225*  BUN 79*   < > 86* 94* 88* 88* 86*  CREATININE 3.52*   < > 3.73* 4.46* 4.31* 4.29* 3.83*  CALCIUM 8.7*   < > 9.0 9.5 9.0 8.8* 8.4*  MG 2.5*  --  2.9*  --   --   --  2.2   < > = values in this interval not displayed.    Liver Function Tests: Recent Labs  Lab 08/30/23 1328 08/31/23 0144  AST 257* 140*  ALT 403* 284*  ALKPHOS 184* 145*  BILITOT 0.8 0.8  PROT 7.1 6.1*  ALBUMIN 3.0* 2.6*    No results for input(s): "LIPASE", "AMYLASE" in the last 168 hours. No results for input(s): "AMMONIA" in the last 168 hours.  CBC: Recent Labs  Lab 08/26/23 0825 08/28/23 0455 08/30/23 1328 08/31/23 0144 09/01/23 0537  WBC 12.3* 7.2 13.5* 12.7* 14.0*  NEUTROABS  --   --  10.8* 10.2*  --   HGB 11.0* 10.1* 10.8* 9.8* 9.6*  HCT 33.7* 31.1* 34.4* 30.3* 29.4*  MCV 100.0 101.0* 102.7* 100.3* 99.3  PLT 271 292 331 274 266    Cardiac Enzymes: No results for input(s): "CKTOTAL", "CKMB", "CKMBINDEX", "TROPONINI"  in the last 168 hours.  BNP: Invalid input(s): "POCBNP"  CBG: Recent Labs  Lab 08/31/23 1139 08/31/23 1640 08/31/23 2217 09/01/23 0829 09/01/23 1211  GLUCAP 205* 196* 262* 186* 263*    Microbiology: Results for orders placed or performed during the hospital encounter of 08/30/23  Resp panel by RT-PCR (RSV, Flu A&B, Covid) Anterior Nasal Swab     Status: None   Collection Time: 08/30/23  1:28 PM   Specimen: Anterior Nasal Swab  Result Value Ref Range Status   SARS Coronavirus 2 by RT PCR NEGATIVE NEGATIVE Final    Comment: (NOTE) SARS-CoV-2 target nucleic acids are NOT DETECTED.  The SARS-CoV-2 RNA is generally detectable in upper  respiratory specimens during the acute phase of infection. The lowest concentration of SARS-CoV-2 viral copies this assay can detect is 138 copies/mL. A negative result does not preclude SARS-Cov-2 infection and should not be used as the sole basis for treatment or other patient management decisions. A negative result may occur with  improper specimen collection/handling, submission of specimen other than nasopharyngeal swab, presence of viral mutation(s) within the areas targeted by this assay, and inadequate number of viral copies(<138 copies/mL). A negative result must be combined with clinical observations, patient history, and epidemiological information. The expected result is Negative.  Fact Sheet for Patients:  BloggerCourse.com  Fact Sheet for Healthcare Providers:  SeriousBroker.it  This test is no t yet approved or cleared by the [place] FDA and  has been authorized for detection and/or diagnosis of SARS-CoV-2 by FDA under an Emergency Use Authorization (EUA). This EUA will remain  in effect (meaning this test can be used) for the duration of the COVID-19 declaration under Section 564(b)(1) of the Act, 21 U.S.C.section 360bbb-3(b)(1), unless the authorization is terminated  or revoked sooner.       Influenza A by PCR NEGATIVE NEGATIVE Final   Influenza B by PCR NEGATIVE NEGATIVE Final    Comment: (NOTE) The Xpert Xpress SARS-CoV-2/FLU/RSV plus assay is intended as an aid in the diagnosis of influenza from Nasopharyngeal swab specimens and should not be used as a sole basis for treatment. Nasal washings and aspirates are unacceptable for Xpert Xpress SARS-CoV-2/FLU/RSV testing.  Fact Sheet for Patients: BloggerCourse.com  Fact Sheet for Healthcare Providers: SeriousBroker.it  This test is not yet approved or cleared by the [place] FDA and has been  authorized for detection and/or diagnosis of SARS-CoV-2 by FDA under an Emergency Use Authorization (EUA). This EUA will remain in effect (meaning this test can be used) for the duration of the COVID-19 declaration under Section 564(b)(1) of the Act, 21 U.S.C. section 360bbb-3(b)(1), unless the authorization is terminated or revoked.     Resp Syncytial Virus by PCR NEGATIVE NEGATIVE Final    Comment: (NOTE) Fact Sheet for Patients: BloggerCourse.com  Fact Sheet for Healthcare Providers: SeriousBroker.it  This test is not yet approved or cleared by the [place] FDA and has been authorized for detection and/or diagnosis of SARS-CoV-2 by FDA under an Emergency Use Authorization (EUA). This EUA will remain in effect (meaning this test can be used) for the duration of the COVID-19 declaration under Section 564(b)(1) of the Act, 21 U.S.C. section 360bbb-3(b)(1), unless the authorization is terminated or revoked.  Performed at [facility], [address] [place], [place] [address]   Blood Culture (routine x 2)     Status: None (Preliminary result)   Collection Time: 08/30/23  2:02 PM   Specimen: BLOOD  Result Value Ref Range  Status   Specimen Description BLOOD BLOOD RIGHT ARM  Final   Special Requests   Final    BOTTLES DRAWN AEROBIC AND ANAEROBIC Blood Culture adequate volume   Culture   Final    NO GROWTH 2 DAYS Performed at [facility], [address] [place], [place] [address]    Report Status PENDING  Incomplete  Blood Culture (routine x 2)     Status: None (Preliminary result)   Collection Time: 08/30/23  2:02 PM   Specimen: BLOOD  Result Value Ref Range Status   Specimen Description BLOOD BLOOD RIGHT ARM  Final   Special Requests   Final    BOTTLES DRAWN AEROBIC AND ANAEROBIC Blood Culture adequate volume   Culture   Final    NO GROWTH 2 DAYS Performed at [facility], [address], [place], [place] [address]    Report Status PENDING  Incomplete  Respiratory (~20 pathogens) panel by PCR     Status: Abnormal   Collection Time: 08/30/23  3:47 PM   Specimen: Nasopharyngeal Swab; Respiratory  Result Value Ref Range Status   Adenovirus NOT DETECTED NOT DETECTED Final   Coronavirus 229E NOT DETECTED NOT DETECTED Final    Comment: (NOTE) The Coronavirus on the Respiratory Panel, DOES NOT test for the novel  Coronavirus (2019 nCoV)    Coronavirus HKU1 NOT DETECTED NOT DETECTED Final   Coronavirus NL63 NOT DETECTED NOT DETECTED Final   Coronavirus OC43 NOT DETECTED NOT DETECTED Final   Metapneumovirus NOT DETECTED NOT DETECTED Final   Rhinovirus / Enterovirus DETECTED (A) NOT DETECTED Final   Influenza A NOT DETECTED NOT DETECTED Final   Influenza B NOT DETECTED NOT DETECTED Final   Parainfluenza Virus 1 NOT DETECTED NOT DETECTED Final   Parainfluenza Virus 2 NOT DETECTED NOT DETECTED Final   Parainfluenza Virus 3 NOT DETECTED NOT DETECTED Final   Parainfluenza Virus 4 NOT DETECTED NOT DETECTED Final   Respiratory Syncytial Virus NOT DETECTED NOT DETECTED Final   Bordetella pertussis NOT DETECTED NOT DETECTED Final   Bordetella Parapertussis NOT DETECTED NOT DETECTED Final   Chlamydophila pneumoniae NOT DETECTED NOT DETECTED Final   Mycoplasma pneumoniae NOT DETECTED NOT DETECTED Final    Comment: Performed at [facility], [address], [place], [place] [address]  Expectorated Sputum Assessment w Gram Stain, Rflx to Resp Cult     Status: None   Collection Time: 08/30/23  4:25 PM   Specimen: Sputum  Result Value Ref Range Status   Specimen Description SPUTUM  Final   Special Requests NONE  Final   Sputum evaluation   Final    THIS SPECIMEN IS ACCEPTABLE FOR SPUTUM CULTURE Performed at [facility], [address] [place], [place] [address]    Report Status 08/30/2023 FINAL  Final  Culture, Respiratory w Gram Stain     Status: None  (Preliminary result)   Collection Time: 08/30/23  4:25 PM   Specimen: SPU  Result Value Ref Range Status   Specimen Description   Final    SPUTUM Performed at [facility], [address], [place], [place] [address]    Special Requests   Final    NONE Reflexed from [phone] Performed at [facility], [address] [place], [place] [address]    Gram Stain   Final    RARE SQUAMOUS EPITHELIAL CELLS PRESENT FEW WBC PRESENT,BOTH PMN AND MONONUCLEAR FEW GRAM POSITIVE COCCI IN PAIRS RARE GRAM POSITIVE COCCI IN CLUSTERS  Culture   Final    MODERATE STAPHYLOCOCCUS EPIDERMIDIS SUSCEPTIBILITIES TO FOLLOW Performed at [facility], [address] [place], [place] [address]    Report Status PENDING  Incomplete  MRSA Next Gen by PCR, Nasal     Status: None   Collection Time: 09/01/23  2:51 AM   Specimen: Nasal Mucosa; Nasal Swab  Result Value Ref Range Status   MRSA by PCR Next Gen NOT DETECTED NOT DETECTED Final    Comment: (NOTE) The GeneXpert MRSA Assay (FDA approved for NASAL specimens only), is one component of a comprehensive MRSA colonization surveillance program. It is not intended to diagnose MRSA infection nor to guide or monitor treatment for MRSA infections. Test performance is not FDA approved in patients less than 71 years old. Performed at [facility], [address] [place], [place] [address]     Coagulation Studies: Recent Labs    08/30/23 1328  LABPROT 15.1  INR 1.2    Urinalysis: Recent Labs    08/30/23 1626  COLORURINE STRAW*  LABSPEC 1.006  PHURINE 5.0  GLUCOSEU NEGATIVE  HGBUR SMALL*  BILIRUBINUR NEGATIVE  KETONESUR NEGATIVE  PROTEINUR NEGATIVE  NITRITE NEGATIVE  LEUKOCYTESUR NEGATIVE      Imaging: ECHOCARDIOGRAM COMPLETE Result Date: 08/31/2023    ECHOCARDIOGRAM REPORT   Patient Name:   Kurt Aguilar [facility] Date of Exam: 08/31/2023 Medical Rec #:  [id number]        Height:       70.0 in Accession #:     [id number]       Weight:       258.6 lb Date of Birth:  11-04-1945        BSA:          2.328 m Patient Age:    77 years         BP:           123/73 mmHg Patient Gender: M                HR:           86 bpm. Exam Location:  [facility] Procedure: 2D Echo, Color Doppler, Cardiac Doppler and Intracardiac            Opacification Agent Indications:     NSTEMI I21.4  History:         Patient has prior history of Echocardiogram examinations, most                  recent 02/05/2023. Previous Myocardial Infarction, Prior CABG;                  Risk Factors:Hypertension and Dyslipidemia.  Sonographer:     Cristela Blue Referring Phys:  [id number] Verdene Lennert Diagnosing Phys: Lorine Bears MD IMPRESSIONS  1. Left ventricular ejection fraction, by estimation, is 55 to 60%. The left ventricle has normal function. The left ventricle has no regional wall motion abnormalities. There is mild left ventricular hypertrophy. Left ventricular diastolic parameters are indeterminate.  2. Right ventricular systolic function is normal. The right ventricular size is normal. There is normal pulmonary artery systolic pressure.  3. Left atrial size was moderately dilated.  4. The mitral valve is degenerative. No evidence of mitral valve regurgitation. Mild mitral stenosis. The mean mitral valve gradient is 4.0 mmHg. Severe mitral annular calcification.  5. The aortic valve is calcified. Aortic valve regurgitation is not visualized. Mild to moderate aortic valve stenosis. AV gradient does not seem to be  accurate. Stenosis degree is assesed visually. FINDINGS  Left Ventricle: Left ventricular ejection fraction, by estimation, is 55 to 60%. The left ventricle has normal function. The left ventricle has no regional wall motion abnormalities. Definity contrast agent was given IV to delineate the left ventricular  endocardial borders. The left ventricular internal cavity size was normal in size. There is mild left ventricular hypertrophy. Left ventricular  diastolic parameters are indeterminate. Right Ventricle: The right ventricular size is normal. No increase in right ventricular wall thickness. Right ventricular systolic function is normal. There is normal pulmonary artery systolic pressure. The tricuspid regurgitant velocity is 2.42 m/s, and  with an assumed right atrial pressure of 5 mmHg, the estimated right ventricular systolic pressure is 28.4 mmHg. Left Atrium: Left atrial size was moderately dilated. Right Atrium: Right atrial size was normal in size. Pericardium: There is no evidence of pericardial effusion. Mitral Valve: The mitral valve is degenerative in appearance. Severe mitral annular calcification. No evidence of mitral valve regurgitation. Mild mitral valve stenosis. MV peak gradient, 11.6 mmHg. The mean mitral valve gradient is 4.0 mmHg. Tricuspid Valve: The tricuspid valve is normal in structure. Tricuspid valve regurgitation is mild . No evidence of tricuspid stenosis. Aortic Valve: The aortic valve is calcified. Aortic valve regurgitation is not visualized. Mild to moderate aortic stenosis is present. Aortic valve mean gradient measures 2.0 mmHg. Aortic valve peak gradient measures 3.0 mmHg. Aortic valve area, by VTI measures 3.47 cm. Pulmonic Valve: The pulmonic valve was normal in structure. Pulmonic valve regurgitation is not visualized. No evidence of pulmonic stenosis. Aorta: The aortic root is normal in size and structure. Venous: The inferior vena cava was not well visualized. IAS/Shunts: No atrial level shunt detected by color flow Doppler.  LEFT VENTRICLE PLAX 2D LVIDd:         4.00 cm   Diastology LVIDs:         3.05 cm   LV e' medial:    5.33 cm/s LV PW:         1.10 cm   LV E/e' medial:  22.1 LV IVS:        1.40 cm   LV e' lateral:   7.29 cm/s LVOT diam:     2.00 cm   LV E/e' lateral: 16.2 LV SV:         53 LV SV Index:   23 LVOT Area:     3.14 cm  RIGHT VENTRICLE RV Basal diam:  3.60 cm RV Mid diam:    3.40 cm RV S prime:     9.90  cm/s TAPSE (M-mode): 1.8 cm LEFT ATRIUM             Index        RIGHT ATRIUM           Index LA diam:        4.90 cm 2.10 cm/m   RA Area:     19.50 cm LA Vol (A2C):   81.6 ml 35.05 ml/m  RA Volume:   49.40 ml  21.22 ml/m LA Vol (A4C):   67.5 ml 28.99 ml/m LA Biplane Vol: 73.7 ml 31.66 ml/m  AORTIC VALVE AV Area (Vmax):    3.04 cm AV Area (Vmean):   2.80 cm AV Area (VTI):     3.47 cm AV Vmax:           86.50 cm/s AV Vmean:          63.500 cm/s AV VTI:  0.152 m AV Peak Grad:      3.0 mmHg AV Mean Grad:      2.0 mmHg LVOT Vmax:         83.80 cm/s LVOT Vmean:        56.500 cm/s LVOT VTI:          0.168 m LVOT/AV VTI ratio: 1.11  AORTA Ao Root diam: 3.30 cm MITRAL VALVE                TRICUSPID VALVE MV Area (PHT): 2.68 cm     TR Peak grad:   23.4 mmHg MV Area VTI:   1.38 cm     TR Vmax:        242.00 cm/s MV Peak grad:  11.6 mmHg MV Mean grad:  4.0 mmHg     SHUNTS MV Vmax:       1.70 m/s     Systemic VTI:  0.17 m MV Vmean:      98.8 cm/s    Systemic Diam: 2.00 cm MV Decel Time: 283 msec MV E velocity: 118.00 cm/s MV A velocity: 140.00 cm/s MV E/A ratio:  0.84 Lorine Bears MD Electronically signed by Lorine Bears MD Signature Date/Time: 08/31/2023/3:20:37 PM    Final    NM Pulmonary Perfusion Result Date: 08/31/2023 CLINICAL DATA:  Worsening shortness of breath. EXAM: NUCLEAR MEDICINE PERFUSION LUNG SCAN TECHNIQUE: Perfusion images were obtained in multiple projections after intravenous injection of radiopharmaceutical. Ventilation scans intentionally deferred if perfusion scan and chest x-ray adequate for interpretation during COVID 19 epidemic. RADIOPHARMACEUTICALS:  4.28 mCi Tc-41m MAA IV COMPARISON:  Chest CT and radiograph 08/30/2023 FINDINGS: Comparison chest radiograph demonstrates airspace opacities in the left lower lobe. Perfusion imaging demonstrates no segmental or subsegmental perfusion abnormalities. Perfusion defect in the left lower lobe corresponds to airspace opacity seen  on prior imaging. IMPRESSION: Nonsegmental perfusion defect correlating to airspace opacities seen on prior imaging. Low probability of pulmonary embolism. Electronically Signed   By: Minerva Fester M.D.   On: 08/31/2023 10:42   US Abdomen Complete Result Date: 08/30/2023 CLINICAL DATA:  Acute kidney injury. Elevated liver function studies. EXAM: ABDOMEN ULTRASOUND COMPLETE COMPARISON:  CT abdomen and pelvis 08/17/2022 FINDINGS: Gallbladder: No gallstones or wall thickening visualized. No sonographic Murphy sign noted by sonographer. Common bile duct: Diameter: 3 mm, normal Liver: Diffusely increased hepatic parenchymal echotexture suggesting fatty infiltration. Subcentimeter cysts. Portal vein is patent on color Doppler imaging with normal direction of blood flow towards the liver. IVC: No abnormality visualized. Pancreas: Visualized portion unremarkable. Spleen: Size and appearance within normal limits. Right Kidney: Length: 15.3 cm. Multiple parenchymal cysts are demonstrated with benign appearance. Largest measures 6.1 cm maximal diameter. Changes are consistent with polycystic kidney. No hydronephrosis. No solid mass identified. No imaging follow-up is indicated. Left Kidney: Length: 12.5 cm. Multiple parenchymal cysts are demonstrated with benign appearance. Largest measures 4.8 cm in maximal diameter. Changes are consistent with polycystic kidney. No hydronephrosis or solid mass identified. No follow-up imaging is indicated. Abdominal aorta: No aneurysm visualized. Other findings: Examination is somewhat technically limited due to body habitus, bowel gas, and limited mobility. IMPRESSION: 1. Diffuse fatty infiltration of the liver. 2. Polycystic kidneys without evidence of obstruction. Electronically Signed   By: Burman Nieves M.D.   On: 08/30/2023 20:25   CT CHEST WO CONTRAST Result Date: 08/30/2023 CLINICAL DATA:  Recent history of pneumonia/sepsis. Productive cough. EXAM: CT CHEST WITHOUT  CONTRAST TECHNIQUE: Multidetector CT imaging of the chest was performed following  the standard protocol without IV contrast. RADIATION DOSE REDUCTION: This exam was performed according to the departmental dose-optimization program which includes automated exposure control, adjustment of the mA and/or kV according to patient size and/or use of iterative reconstruction technique. COMPARISON:  CT chest dated August 22, 2023. FINDINGS: Cardiovascular: Normal heart size. No pericardial effusion. Atherosclerosis of the thoracic aorta and arch branch vessels. Multivessel coronary artery calcifications. Aortic valve and mitral annulus calcifications. Prior CABG. Mediastinum/Nodes: Redemonstration of multiple prominent mediastinal lymph nodes, similar to slightly decreased in size compared to the prior exam. Calcified mediastinal and right hilar granulomatous nodes. Secretions noted in the left mainstem bronchus. Thyroid gland and esophagus demonstrate no significant abnormality. Lungs/Pleura: Similar small right and trace left pleural effusions. Calcified pleural plaques in the left hemithorax. Patchy bilateral multifocal peripheral predominant consolidation is again noted with increased consolidative changes at the right lung base and the superior aspect of the posterior left lower lobe. No pneumothorax. Upper Abdomen: No acute abnormality. Musculoskeletal: Intact median sternotomy wires. No suspicious osseous lesion. No acute osseous abnormality. IMPRESSION: 1. Multilobar pneumonia with increased airspace consolidation at the right lung base and the posterior left lower lobe. 2. Similar small right and trace left pleural effusions. 3. Additional unchanged ancillary findings, as described above. Aortic Atherosclerosis (ICD10-I70.0). Electronically Signed   By: Hart Robinsons M.D.   On: 08/30/2023 17:03   DG Chest 2 View Result Date: 08/30/2023 CLINICAL DATA:  Cough and dyspnea. Recent hospitalization for  pneumonia/sepsis. EXAM: CHEST - 2 VIEW COMPARISON:  08/22/2023 FINDINGS: Lungs are hypoinflated demonstrate persistent opacification over the left base likely infection with associated small left pleural effusion. Possible small amount right pleural fluid. Calcified pleural plaques over the lateral left upper lung/apex. Mild stable cardiomegaly. Remainder of the exam is unchanged. IMPRESSION: 1. Persistent opacification over the left base likely infection with associated small left pleural effusion. Possible small amount right pleural fluid. 2. Stable cardiomegaly. Electronically Signed   By: Elberta Fortis M.D.   On: 08/30/2023 13:43     Medications:    ceFEPime (MAXIPIME) IV Stopped (08/31/23 1607)   heparin 1,000 Units/hr (08/31/23 2210)     aspirin EC  81 mg Oral Daily   cyanocobalamin  500 mcg Oral Daily   ezetimibe  10 mg Oral Daily   famotidine  20 mg Oral QHS   gabapentin  100 mg Oral QHS   insulin aspart  0-15 Units Subcutaneous TID WC   ipratropium-albuterol  3 mL Nebulization Q6H   predniSONE  40 mg Oral Q breakfast   QUEtiapine  50 mg Oral QHS   senna-docusate  1 tablet Oral QHS   sodium chloride flush  3 mL Intravenous Q12H   acetaminophen **OR** acetaminophen, nitroGLYCERIN, ondansetron **OR** ondansetron (ZOFRAN) IV, polyethylene glycol  Assessment/ Plan:  Mr. DIONYSUS VANDERHOEK is a 77 y.o.  male with past medical conditions including hypertension, diabetes, hyperlipidemia, gout, CAD status post CABG in 1997, GERD, polycystic kidney disease, and chronic kidney disease stage IV.  Patient presented to the emergency department with shortness of breath and was admitted at that time for Transaminitis [R74.01] AKI (acute kidney injury) (HCC) [N17.9] HCAP (healthcare-associated pneumonia) [J18.9] Sepsis, due to unspecified organism, unspecified whether acute organ dysfunction present [facility]) [A41.9] Acute hypoxic respiratory failure (HCC) [J96.01]   Acute Kidney Injury on chronic  kidney disease stage IV with baseline creatinine 2.23 on 04/15/23.  Acute kidney injury secondary to volume overload.  IV contrast exposure on 08/31/2023. Chronic kidney disease is multifactorial:  Diabetes and polycystic kidney disease, hypertension, obesity Patient recently discharged with creatinine 3.73, currently admitted with creatinine 4.46.   Creatinine has improved to 3.83 today.  Will continue to monitor during this admission.  No acute indication for dialysis.  Patient currently prescribed torsemide 40 mg daily outpatient.  Will assess appropriate timing to restart this medication.   Lab Results  Component Value Date   CREATININE 3.83 (H) 09/01/2023   CREATININE 4.29 (H) 08/31/2023   CREATININE 4.31 (H) 08/30/2023    Intake/Output Summary (Last 24 hours) at 09/01/2023 1248 Last data filed at 09/01/2023 0700 Gross per 24 hour  Intake 358.7 ml  Output 400 ml  Net -41.3 ml    2. Anemia of chronic kidney disease Lab Results  Component Value Date   HGB 9.6 (L) 09/01/2023    Hgb within optimal range  3. Secondary Hyperparathyroidism: with outpatient labs: PTH 73, phosphorus 4.2, calcium 9.7 on 08/18/23.   Lab Results  Component Value Date   CALCIUM 8.4 (L) 09/01/2023   CAION 1.15 09/17/2021   PHOS 3.4 09/12/2016    Patient currently prescribed calcium carbonate with vitamin D outpatient.  Will continue to monitor bone minerals during this admission  4. Diabetes mellitus type II with chronic kidney disease/renal manifestations: insulin dependent. Home regimen includes semaglutide, Novolin 70/30 and lispro. Most recent hemoglobin A1c is 6.3 on 05/08/23.   Glucose remains elevated at times.  Primary team to manage SSI    LOS: 2 Adalyne Lovick 12/24/202412:48 PM

## 2023-09-01 NOTE — Progress Notes (Signed)
CBG 309. MD on call notified. Will recheck it at midnight per MD instructions.

## 2023-09-01 NOTE — Progress Notes (Signed)
PROGRESS NOTE    ADHRITH WIERMAN  MWN:[id number] DOB: August 17, 1946 DOA: 08/30/2023 PCP: Judy Pimple, MD  [id number]  LOS: 2 days   Brief hospital course:   Assessment & Plan:  GAEL HINSDALE is a 77 y.o. 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   Mr. Tait was recently admitted from 12/12 to 12/20 for acute hypoxic respiratory failure in the setting of tracheobronchomalacia and suspected LLL pneumonia + AKI on CKD 2/2 contrast exposure.  He was discharged to SNF on 12/20.   * Acute hypoxic respiratory failure (HCC) 2/2 Rhinovirus pneumonia with superimposed possible bacterial pneumonia Patient is presenting with AMS morning of presentation in the setting of hypoxia that resolved on placement of supplemental oxygen.  He was recently admitted for pneumonia and received 5 days of meropenem and azithromycin with additional 2 days of Rocephin.  Pulmonology has been consulted and recommending treatment for potential hospital associated pneumonia.  Hypoxia may also be related to underlying NSTEMI/HF given slightly enlarged pleural effusions in addition to ongoing tracheobronchomalacia. --d/c vanc since MRSA screen neg --cont cefepime for 5-day course --cont prednisone at increased 40 mg daily --Pulmonary hygiene with 3% hypertonic saline neb preceded by Albuterol neb Q8H. Followed by Flutter valve use and IS. Consider cpap 5-15 at bedtime.   Trop elevation 2/2 demand ischemia NSTEMI ruled out --trop peaked at 341.  Echo without wall motion abnormalities.    CAD s/p CABG --cont ASA and ezetimibe - Consider ischemic evaluation as outpatient   Tracheobronchomalacia Chronic steroid Per chart review, patient has a previous history of BOOP with  evaluation by pulmonology on last admission noting clinically and radiologically more consistent with tracheobronchomalacia. --has been on chronic prednisone 20 mg daily for many years. - Pulmonology consulted --cont prednisone at increased 40 mg daily.    Chronic heart failure with preserved ejection fraction (HCC) Although patient does not appear significantly hypervolemic on examination, he is noted to have slightly increased pleural effusions on CT imaging and his BNP is increased compared to prior at 336.  Given AKI, he received small IV bolus of fluids.  Will hold off on diuretics at this time and recheck renal function in the evening.  If evidence of worsening renal function, will trial a dose of Lasix, given the possibility this may be cardiorenal in nature. --hold off diuretics given renal decline  Acute kidney injury superimposed on chronic kidney disease 4 (HCC) Patient has a history of CKD stage IV With AKI on most recent admission.  On discharge, his creatinine was noted to be slowly rising as high as 3.73, currently 4.46.   - Nephrology consulted --hold home torsemide  Elevated LFTs Unclear etiology at this time.  No prior history of cirrhosis, and INR and platelets are within normal limits.  He is not experiencing any abdominal pain to suggest cholecystitis.  No reported alcohol use.   - Right upper quadrant ultrasound no acute finding - Hold home statin  Type 2 diabetes mellitus (HCC) - Hold home antiglycemic agents - SSI, moderate  Neuropathy - Decrease home gabapentin at bedtime from 300 mg to 100 mg given AKI  Essential hypertension - Hold home antihypertensives in the setting of high risk for hypotension.  Obstructive sleep apnea - CPAP at bedtime   DVT prophylaxis: Heparin SQ  Code Status: DNR  Family Communication: family updated at bedside today Level of care: Med-Surg Dispo:   The patient is from: SNF rehab Anticipated d/c is to: SNF rehab Anticipated  d/c date is: > 3 days   Subjective and Interval History:  Pt reported confusion early in the morning which improved as day went on.  Family noted slower speech and thought process.   Objective: Vitals:   09/01/23 1000 09/01/23 1100 09/01/23 1115 09/01/23 1200  BP:    104/74  Pulse: 100 86  76  Resp: (!) 27 (!) 29 16 (!) 22  Temp:      TempSrc:      SpO2: 93% 96%  99%  Weight:      Height:        Intake/Output Summary (Last 24 hours) at 09/01/2023 2013 Last data filed at 09/01/2023 1700 Gross per 24 hour  Intake 56.83 ml  Output 850 ml  Net -793.17 ml   Filed Weights   09/01/23 0229  Weight: 108.5 kg    Examination:   Constitutional: NAD, AAOx3 HEENT: conjunctivae and lids normal, EOMI CV: No cyanosis.   RESP: normal respiratory effort Neuro: II - XII grossly intact.   Psych: Normal mood and affect.  Appropriate judgement and reason   Data Reviewed: I have personally reviewed labs and imaging studies  Time spent: 50 minutes  Darlin Priestly, MD [facility] If 7PM-7AM, please contact night-coverage 09/01/2023, 8:13 PM

## 2023-09-01 NOTE — Plan of Care (Signed)
  Problem: Education: Goal: Knowledge of General Education information will improve Description: Including pain rating scale, medication(s)/side effects and non-pharmacologic comfort measures Outcome: Progressing   Problem: Clinical Measurements: Goal: Respiratory complications will improve Outcome: Progressing   Problem: Clinical Measurements: Goal: Cardiovascular complication will be avoided Outcome: Progressing   Problem: Pain Management: Goal: General experience of comfort will improve Outcome: Progressing   Problem: Safety: Goal: Ability to remain free from injury will improve Outcome: Progressing

## 2023-09-01 NOTE — Consult Note (Signed)
PHARMACY - ANTICOAGULATION CONSULT NOTE  Pharmacy Consult for Heparin Infusion Indication: chest pain/ACS  Patient Measurements: Hight: 70 inches Wt 117.3 kg IBW 73 kg Heparin Dosing Weight: 99 kg  Labs: Recent Labs    08/30/23 1328 08/30/23 1328 08/30/23 2049 08/31/23 0142 08/31/23 0144 08/31/23 1145 08/31/23 2211 09/01/23 0537  HGB 10.8*  --   --   --  9.8*  --   --  9.6*  HCT 34.4*  --   --   --  30.3*  --   --  29.4*  PLT 331  --   --   --  274  --   --  266  APTT 25  --   --   --   --   --   --   --   LABPROT 15.1  --   --   --   --   --   --   --   INR 1.2  --   --   --   --   --   --   --   HEPARINUNFRC  --    < >  --  0.77*  --  0.72* 0.68 0.64  CREATININE 4.46*  --  4.31*  --  4.29*  --   --  3.83*  TROPONINIHS 341*  --  275* 232*  --   --   --   --    < > = values in this interval not displayed.    Estimated Creatinine Clearance: 18.4 mL/min (A) (by C-G formula based on SCr of 3.83 mg/dL (H)).   Medical History: Past Medical History:  Diagnosis Date   Arthritis    Chronic kidney disease    Complication of anesthesia    constipation and inability to  urinate happened a few days after cataract surgery    Diabetes (HCC)    type 2    Diverticulitis    GERD (gastroesophageal reflux disease)    Gout    Heart attack (HCC)    Heart disease    Heart murmur    Hyperglycemia    Hyperlipidemia    Hypertension    Osteoporosis    Pneumonia    hx of BOOP followed by Dr Sherene Sires    Sleep apnea    cpapp - setting at 17    Medications:  No AC prior to admission per med list and fill history  Baseline CBC appropriate to start heparin infusion. Baseline INR = 1.2, Aptt 25  Assessment: Patient admitted to ER with shortness of breath. PMH includes hypertension, diabetes, hyperlipidemia, gout, CAD status post CABG in 1997, GERD, polycystic kidney disease, and chronic kidney disease stage IV. Also noted dx of idiopathic pulmonary disease with recent discharge from  [facility] on 12/20.  Goal of Therapy:  Heparin level 0.3-0.7 units/ml Monitor platelets by anticoagulation protocol: Yes   Results --12/23 1145 = 0.72, supratherapeutic --12/23 2211 HL 0.68, therapeutic --12/24 0537 HL 0.68, therapeutic  Plan:  --Continue heparin infusion at 1000 units/hr --Re-check HL w/ AM labs  --Daily CBC per protocol while on IV heparin  Maggie Senseney Rodriguez-Guzman PharmD, BCPS 09/01/2023 7:35 AM

## 2023-09-01 NOTE — Progress Notes (Signed)
Rounding Note    Patient Name: Kurt Aguilar Date of Encounter: 09/01/2023  [facility] Cardiologist: Julien Nordmann, MD   Subjective   Patient is seen on AM rounds, wife at the bedside. Continued symptoms of cough and shortness of breath although improving. Echocardiogram yesterday showed LVEF 55-60% without wall motion abnormalities. No further ischemic evaluation planned.   Inpatient Medications    Scheduled Meds:  aspirin EC  81 mg Oral Daily   cyanocobalamin  500 mcg Oral Daily   ezetimibe  10 mg Oral Daily   famotidine  20 mg Oral QHS   gabapentin  100 mg Oral QHS   insulin aspart  0-15 Units Subcutaneous TID WC   ipratropium-albuterol  3 mL Nebulization Q6H   predniSONE  40 mg Oral Q breakfast   QUEtiapine  50 mg Oral QHS   senna-docusate  1 tablet Oral QHS   sodium chloride flush  3 mL Intravenous Q12H   Continuous Infusions:  ceFEPime (MAXIPIME) IV Stopped (08/31/23 1607)   heparin 1,000 Units/hr (08/31/23 2210)   PRN Meds: acetaminophen **OR** acetaminophen, nitroGLYCERIN, ondansetron **OR** ondansetron (ZOFRAN) IV, polyethylene glycol   Vital Signs    Vitals:   09/01/23 0645 09/01/23 0700 09/01/23 0715 09/01/23 0730  BP:      Pulse: 81 86 91 83  Resp: (!) 23 (!) 23 (!) 22 (!) 23  Temp:      TempSrc:      SpO2: 93% 93% (!) 89% 99%  Weight:      Height:        Intake/Output Summary (Last 24 hours) at 09/01/2023 0831 Last data filed at 09/01/2023 0700 Gross per 24 hour  Intake 358.7 ml  Output 650 ml  Net -291.3 ml      09/01/2023    2:29 AM 08/23/2023    8:56 PM 08/20/2023   10:46 PM  Last 3 Weights  Weight (lbs) 239 lb 3.2 oz 258 lb 9.6 oz 253 lb 8.5 oz  Weight (kg) 108.5 kg 117.3 kg 115 kg      Telemetry    Sinus rhythm with occasional PVCs, rate 80s - Personally Reviewed  Physical Exam   GEN: No acute distress.   Neck: No JVD Cardiac: RRR, I/VI systolic murmur RUSB, I/IV diastolic murmur at the apex, no rubs or  gallops.  Respiratory: Lung sounds diminished with faint wheezing bilaterally GI: Soft, nontender, non-distended  MS: No edema; No deformity. Neuro:  Nonfocal  Psych: Normal affect   Labs    High Sensitivity Troponin:   Recent Labs  Lab 08/30/23 1328 08/30/23 2049 08/31/23 0142  TROPONINIHS 341* 275* 232*     Chemistry Recent Labs  Lab 08/26/23 0711 08/27/23 0936 08/28/23 0455 08/30/23 1328 08/30/23 2049 08/31/23 0144 09/01/23 0537  NA 142   < > 143 140 140 140 137  K 4.6   < > 4.6 4.6 4.5 4.3 3.8  CL 104   < > 101 93* 96* 97* 97*  CO2 24   < > 27 31 28 28 24   GLUCOSE 115*   < > 117* 224* 208* 293* 225*  BUN 79*   < > 86* 94* 88* 88* 86*  CREATININE 3.52*   < > 3.73* 4.46* 4.31* 4.29* 3.83*  CALCIUM 8.7*   < > 9.0 9.5 9.0 8.8* 8.4*  MG 2.5*  --  2.9*  --   --   --  2.2  PROT  --   --   --  7.1  --  6.1*  --   ALBUMIN  --   --   --  3.0*  --  2.6*  --   AST  --   --   --  257*  --  140*  --   ALT  --   --   --  403*  --  284*  --   ALKPHOS  --   --   --  184*  --  145*  --   BILITOT  --   --   --  0.8  --  0.8  --   GFRNONAA 17*   < > 16* 13* 13* 13* 15*  ANIONGAP 14   < > 15 16* 16* 15 16*   < > = values in this interval not displayed.    Lipids No results for input(s): "CHOL", "TRIG", "HDL", "LABVLDL", "LDLCALC", "CHOLHDL" in the last 168 hours.  Hematology Recent Labs  Lab 08/30/23 1328 08/31/23 0144 09/01/23 0537  WBC 13.5* 12.7* 14.0*  RBC 3.35* 3.02* 2.96*  HGB 10.8* 9.8* 9.6*  HCT 34.4* 30.3* 29.4*  MCV 102.7* 100.3* 99.3  MCH 32.2 32.5 32.4  MCHC 31.4 32.3 32.7  RDW 14.8 14.7 14.9  PLT 331 274 266   Thyroid No results for input(s): "TSH", "FREET4" in the last 168 hours.  BNP Recent Labs  Lab 08/30/23 1328  BNP 336.3*    DDimer No results for input(s): "DDIMER" in the last 168 hours.   Radiology    ECHOCARDIOGRAM COMPLETE Result Date: 08/31/2023    ECHOCARDIOGRAM REPORT   Patient Name:   [name] [facility] Date of Exam: 08/31/2023  Medical Rec #:  [id number]        Height:       70.0 in Accession #:    [id number]       Weight:       258.6 lb Date of Birth:  03-Aug-1946        BSA:          2.328 m Patient Age:    47 years         BP:           123/73 mmHg Patient Gender: M                HR:           86 bpm. Exam Location:  [facility] Procedure: 2D Echo, Color Doppler, Cardiac Doppler and Intracardiac            Opacification Agent Indications:     NSTEMI I21.4  History:         Patient has prior history of Echocardiogram examinations, most                  recent 02/05/2023. Previous Myocardial Infarction, Prior CABG;                  Risk Factors:Hypertension and Dyslipidemia.  Sonographer:     Cristela Blue Referring Phys:  [id number] Verdene Lennert Diagnosing Phys: Lorine Bears MD IMPRESSIONS  1. Left ventricular ejection fraction, by estimation, is 55 to 60%. The left ventricle has normal function. The left ventricle has no regional wall motion abnormalities. There is mild left ventricular hypertrophy. Left ventricular diastolic parameters are indeterminate.  2. Right ventricular systolic function is normal. The right ventricular size is normal. There is normal pulmonary artery systolic pressure.  3. Left atrial size was moderately dilated.  4. The mitral valve is degenerative. No  evidence of mitral valve regurgitation. Mild mitral stenosis. The mean mitral valve gradient is 4.0 mmHg. Severe mitral annular calcification.  5. The aortic valve is calcified. Aortic valve regurgitation is not visualized. Mild to moderate aortic valve stenosis. AV gradient does not seem to be accurate. Stenosis degree is assesed visually. FINDINGS  Left Ventricle: Left ventricular ejection fraction, by estimation, is 55 to 60%. The left ventricle has normal function. The left ventricle has no regional wall motion abnormalities. Definity contrast agent was given IV to delineate the left ventricular  endocardial borders. The left ventricular internal cavity size was normal  in size. There is mild left ventricular hypertrophy. Left ventricular diastolic parameters are indeterminate. Right Ventricle: The right ventricular size is normal. No increase in right ventricular wall thickness. Right ventricular systolic function is normal. There is normal pulmonary artery systolic pressure. The tricuspid regurgitant velocity is 2.42 m/s, and  with an assumed right atrial pressure of 5 mmHg, the estimated right ventricular systolic pressure is 28.4 mmHg. Left Atrium: Left atrial size was moderately dilated. Right Atrium: Right atrial size was normal in size. Pericardium: There is no evidence of pericardial effusion. Mitral Valve: The mitral valve is degenerative in appearance. Severe mitral annular calcification. No evidence of mitral valve regurgitation. Mild mitral valve stenosis. MV peak gradient, 11.6 mmHg. The mean mitral valve gradient is 4.0 mmHg. Tricuspid Valve: The tricuspid valve is normal in structure. Tricuspid valve regurgitation is mild . No evidence of tricuspid stenosis. Aortic Valve: The aortic valve is calcified. Aortic valve regurgitation is not visualized. Mild to moderate aortic stenosis is present. Aortic valve mean gradient measures 2.0 mmHg. Aortic valve peak gradient measures 3.0 mmHg. Aortic valve area, by VTI measures 3.47 cm. Pulmonic Valve: The pulmonic valve was normal in structure. Pulmonic valve regurgitation is not visualized. No evidence of pulmonic stenosis. Aorta: The aortic root is normal in size and structure. Venous: The inferior vena cava was not well visualized. IAS/Shunts: No atrial level shunt detected by color flow Doppler.  LEFT VENTRICLE PLAX 2D LVIDd:         4.00 cm   Diastology LVIDs:         3.05 cm   LV e' medial:    5.33 cm/s LV PW:         1.10 cm   LV E/e' medial:  22.1 LV IVS:        1.40 cm   LV e' lateral:   7.29 cm/s LVOT diam:     2.00 cm   LV E/e' lateral: 16.2 LV SV:         53 LV SV Index:   23 LVOT Area:     3.14 cm  RIGHT  VENTRICLE RV Basal diam:  3.60 cm RV Mid diam:    3.40 cm RV S prime:     9.90 cm/s TAPSE (M-mode): 1.8 cm LEFT ATRIUM             Index        RIGHT ATRIUM           Index LA diam:        4.90 cm 2.10 cm/m   RA Area:     19.50 cm LA Vol (A2C):   81.6 ml 35.05 ml/m  RA Volume:   49.40 ml  21.22 ml/m LA Vol (A4C):   67.5 ml 28.99 ml/m LA Biplane Vol: 73.7 ml 31.66 ml/m  AORTIC VALVE AV Area (Vmax):    3.04 cm AV Area (  Vmean):   2.80 cm AV Area (VTI):     3.47 cm AV Vmax:           86.50 cm/s AV Vmean:          63.500 cm/s AV VTI:            0.152 m AV Peak Grad:      3.0 mmHg AV Mean Grad:      2.0 mmHg LVOT Vmax:         83.80 cm/s LVOT Vmean:        56.500 cm/s LVOT VTI:          0.168 m LVOT/AV VTI ratio: 1.11  AORTA Ao Root diam: 3.30 cm MITRAL VALVE                TRICUSPID VALVE MV Area (PHT): 2.68 cm     TR Peak grad:   23.4 mmHg MV Area VTI:   1.38 cm     TR Vmax:        242.00 cm/s MV Peak grad:  11.6 mmHg MV Mean grad:  4.0 mmHg     SHUNTS MV Vmax:       1.70 m/s     Systemic VTI:  0.17 m MV Vmean:      98.8 cm/s    Systemic Diam: 2.00 cm MV Decel Time: 283 msec MV E velocity: 118.00 cm/s MV A velocity: 140.00 cm/s MV E/A ratio:  0.84 Lorine Bears MD Electronically signed by Lorine Bears MD Signature Date/Time: 08/31/2023/3:20:37 PM    Final    NM Pulmonary Perfusion Result Date: 08/31/2023 CLINICAL DATA:  Worsening shortness of breath. EXAM: NUCLEAR MEDICINE PERFUSION LUNG SCAN TECHNIQUE: Perfusion images were obtained in multiple projections after intravenous injection of radiopharmaceutical. Ventilation scans intentionally deferred if perfusion scan and chest x-ray adequate for interpretation during COVID 19 epidemic. RADIOPHARMACEUTICALS:  4.28 mCi Tc-40m MAA IV COMPARISON:  Chest CT and radiograph 08/30/2023 FINDINGS: Comparison chest radiograph demonstrates airspace opacities in the left lower lobe. Perfusion imaging demonstrates no segmental or subsegmental perfusion abnormalities.  Perfusion defect in the left lower lobe corresponds to airspace opacity seen on prior imaging. IMPRESSION: Nonsegmental perfusion defect correlating to airspace opacities seen on prior imaging. Low probability of pulmonary embolism. Electronically Signed   By: Minerva Fester M.D.   On: 08/31/2023 10:42   US Abdomen Complete Result Date: 08/30/2023 CLINICAL DATA:  Acute kidney injury. Elevated liver function studies. EXAM: ABDOMEN ULTRASOUND COMPLETE COMPARISON:  CT abdomen and pelvis 08/17/2022 FINDINGS: Gallbladder: No gallstones or wall thickening visualized. No sonographic Murphy sign noted by sonographer. Common bile duct: Diameter: 3 mm, normal Liver: Diffusely increased hepatic parenchymal echotexture suggesting fatty infiltration. Subcentimeter cysts. Portal vein is patent on color Doppler imaging with normal direction of blood flow towards the liver. IVC: No abnormality visualized. Pancreas: Visualized portion unremarkable. Spleen: Size and appearance within normal limits. Right Kidney: Length: 15.3 cm. Multiple parenchymal cysts are demonstrated with benign appearance. Largest measures 6.1 cm maximal diameter. Changes are consistent with polycystic kidney. No hydronephrosis. No solid mass identified. No imaging follow-up is indicated. Left Kidney: Length: 12.5 cm. Multiple parenchymal cysts are demonstrated with benign appearance. Largest measures 4.8 cm in maximal diameter. Changes are consistent with polycystic kidney. No hydronephrosis or solid mass identified. No follow-up imaging is indicated. Abdominal aorta: No aneurysm visualized. Other findings: Examination is somewhat technically limited due to body habitus, bowel gas, and limited mobility. IMPRESSION: 1. Diffuse fatty infiltration of the liver. 2.  Polycystic kidneys without evidence of obstruction. Electronically Signed   By: Burman Nieves M.D.   On: 08/30/2023 20:25   CT CHEST WO CONTRAST Result Date: 08/30/2023 CLINICAL DATA:   Recent history of pneumonia/sepsis. Productive cough. EXAM: CT CHEST WITHOUT CONTRAST TECHNIQUE: Multidetector CT imaging of the chest was performed following the standard protocol without IV contrast. RADIATION DOSE REDUCTION: This exam was performed according to the departmental dose-optimization program which includes automated exposure control, adjustment of the mA and/or kV according to patient size and/or use of iterative reconstruction technique. COMPARISON:  CT chest dated August 22, 2023. FINDINGS: Cardiovascular: Normal heart size. No pericardial effusion. Atherosclerosis of the thoracic aorta and arch branch vessels. Multivessel coronary artery calcifications. Aortic valve and mitral annulus calcifications. Prior CABG. Mediastinum/Nodes: Redemonstration of multiple prominent mediastinal lymph nodes, similar to slightly decreased in size compared to the prior exam. Calcified mediastinal and right hilar granulomatous nodes. Secretions noted in the left mainstem bronchus. Thyroid gland and esophagus demonstrate no significant abnormality. Lungs/Pleura: Similar small right and trace left pleural effusions. Calcified pleural plaques in the left hemithorax. Patchy bilateral multifocal peripheral predominant consolidation is again noted with increased consolidative changes at the right lung base and the superior aspect of the posterior left lower lobe. No pneumothorax. Upper Abdomen: No acute abnormality. Musculoskeletal: Intact median sternotomy wires. No suspicious osseous lesion. No acute osseous abnormality. IMPRESSION: 1. Multilobar pneumonia with increased airspace consolidation at the right lung base and the posterior left lower lobe. 2. Similar small right and trace left pleural effusions. 3. Additional unchanged ancillary findings, as described above. Aortic Atherosclerosis (ICD10-I70.0). Electronically Signed   By: Hart Robinsons M.D.   On: 08/30/2023 17:03   DG Chest 2 View Result Date:  08/30/2023 CLINICAL DATA:  Cough and dyspnea. Recent hospitalization for pneumonia/sepsis. EXAM: CHEST - 2 VIEW COMPARISON:  08/22/2023 FINDINGS: Lungs are hypoinflated demonstrate persistent opacification over the left base likely infection with associated small left pleural effusion. Possible small amount right pleural fluid. Calcified pleural plaques over the lateral left upper lung/apex. Mild stable cardiomegaly. Remainder of the exam is unchanged. IMPRESSION: 1. Persistent opacification over the left base likely infection with associated small left pleural effusion. Possible small amount right pleural fluid. 2. Stable cardiomegaly. Electronically Signed   By: Elberta Fortis M.D.   On: 08/30/2023 13:43    Cardiac Studies   2D echo 08/31/2023  1. Left ventricular ejection fraction, by estimation, is 55 to 60%. The  left ventricle has normal function. The left ventricle has no regional  wall motion abnormalities. There is mild left ventricular hypertrophy.  Left ventricular diastolic parameters  are indeterminate.   2. Right ventricular systolic function is normal. The right ventricular  size is normal. There is normal pulmonary artery systolic pressure.   3. Left atrial size was moderately dilated.   4. The mitral valve is degenerative. No evidence of mitral valve  regurgitation. Mild mitral stenosis. The mean mitral valve gradient is 4.0  mmHg. Severe mitral annular calcification.   5. The aortic valve is calcified. Aortic valve regurgitation is not  visualized. Mild to moderate aortic valve stenosis. AV gradient does not  seem to be accurate. Stenosis degree is assesed visually.   2D echo 02/05/2023: 1. Left ventricular ejection fraction, by estimation, is 60 to 65%. The  left ventricle has normal function. The left ventricle has no regional  wall motion abnormalities. There is mild left ventricular hypertrophy.  Left ventricular diastolic parameters  are consistent with Grade I  diastolic dysfunction (impaired relaxation).   2. Right ventricular systolic function is normal. The right ventricular  size is normal.   3. Left atrial size was moderately dilated.   4. The mitral valve is degenerative. Mild mitral valve regurgitation.  Moderate mitral stenosis. The mean mitral valve gradient is 8.0 mmHg.  Moderate mitral annular calcification.   5. The aortic valve is calcified. Aortic valve regurgitation is not  visualized. Mild aortic valve stenosis. Aortic valve mean gradient  measures 12.0 mmHg.   Comparison(s): EF 60%, normal MV, calcified AOV with no stenosis.  __________   2D echo 01/12/2023: Normal sinus rhythm Patient had a min HR of 58 bpm, max HR of 176 bpm, and avg HR of 100 bpm.    2 Supraventricular Tachycardia runs occurred, the run with the fastest interval lasting 16 beats with a max rate of 176 bpm (avg 139 bpm); the run with the fastest interval was also the longest.    Isolated SVEs were rare (<1.0%), SVE Couplets were rare (<1.0%), and no SVE Triplets were present.  Isolated VEs were rare (<1.0%, 2178), VE Couplets were rare (<1.0%, 31), and VE Triplets were rare (<1.0%, 1).    No patient triggered events reported __________   [facility] 09/19/2021: CONCLUSIONS: Severe diffuse calcified coronary disease of the native arteries with 100% proximal RCA, 90% ostial circumflex, 99% distal left main, severely and diffusely diseased proximal to mid LAD with threatened septal perforator. Diffusely diseased saphenous vein graft to the right coronary.  Ostial to proximal 70% stenosis. Widely patent sequential graft to first and second obtuse marginal Widely patent saphenous vein graft to the first diagonal Widely patent LIMA to the mid LAD Aortic valve is heavily calcified, was difficult to cross, but without significant gradient.  LVEDP 10 mmHg. Total contrast 70 cc   RECOMMENDATIONS:   Continue IV fluids for 18 to 24 hours.  He was given an additional 250 cc  of saline as a bolus after identifying that LVEDP was 9 mmHg. Monitor kidney function closely.  Contrast exposure was 70 cc. The saphenous vein graft to the right coronary can be stented although it is not so critical that it should be causing enzyme elevation.  Enzyme elevation likely related to left coronary native disease in the septal perforator territory. __________   2D echo 09/18/2021: 1. Left ventricular ejection fraction, by estimation, is 60 to 65%. The  left ventricle has normal function. The left ventricle has no regional  wall motion abnormalities. Left ventricular diastolic parameters are  consistent with Grade I diastolic  dysfunction (impaired relaxation). Elevated left ventricular end-diastolic  pressure.   2. Right ventricular systolic function is moderately reduced. The right  ventricular size is normal.   3. The mitral valve is normal in structure. No evidence of mitral valve  regurgitation. No evidence of mitral stenosis.   4. The AV is poorly visualized to comment on structure. There does appear  to be come calcification of the valve. . The aortic valve was not well  visualized. Aortic valve regurgitation is not visualized. No aortic  stenosis is present.   Patient Profile     LUC VEDDER is a 77 y.o. male with a hx of CAD status post CABG in 1997 in [place], [place], moderate mitral valve stenosis, tracheobronchomalacia , CKD stage IV, DM2, syncope, HTN, HLD, obesity, and OSA on CPAP who is admitted with acute on chronic hypoxic respiratory distress in the setting of multilobar pneumonia and we are seeing for  elevated troponin at the request of Dr. Huel Cote.  Assessment & Plan   CAD s/p CABG  Elevated troponin - Never with symptoms of angina or cardiac decompensation - Troponin peaked at 341 on 12/22 - Mildly elevated and downtrending high-sensitivity troponin likely reflective of supply/demand ischemia in the setting of underlying CAD with acute hypoxic  respiratory failure in the setting of multilobar pneumonia and underlying CKD stage IV - Echo showed LVEF 55-60 % without wall motion abnormalities - Continue IV heparin for total of 48 hours - Will defer cardiac cath due to lack to anginal symptoms and severe renal dysfunction - Continue ASA 81 mg and ezetimibe 10 mg - Consider ischemic evaluation as outpatient  HFpEF - Suspect symptoms are largely pulmonary in etiology - CT chest showed small right and trace left pleural effusions - Echo showed LVEF 55-60% without wall motion abnormality - Cr improved today 4.29>>3.83 - Defer addition of diuretic at this time in the setting of AKI, renal function improving with IV fluids  Mitral stenosis - Do not suspect this is contributing to symptoms - Echo showed mild mitral stenosis  Acute hypoxic respiratory failure secondary to multilobar pneumonia and tracheobronchomalacia - Management per IM  HTN - Well controlled  HLD - Most recent lipid panel 04/2023 LDL 63 - Continue PTA ezetimibe 10 mg  OSA - CPAP  For questions or updates, please contact [facility] Please consult [email or web address] for contact info under        Signed, Orion Crook, PA-C  09/01/2023, 8:31 AM

## 2023-09-01 NOTE — Consult Note (Signed)
PHARMACY - ANTICOAGULATION CONSULT NOTE  Pharmacy Consult for Heparin Infusion Indication: chest pain/ACS  Patient Measurements: Hight: 70 inches Wt 117.3 kg IBW 73 kg Heparin Dosing Weight: 99 kg  Labs: Recent Labs    08/30/23 1328 08/30/23 1328 08/30/23 2049 08/31/23 0142 08/31/23 0144 08/31/23 1145 08/31/23 2211 09/01/23 0537  HGB 10.8*  --   --   --  9.8*  --   --  9.6*  HCT 34.4*  --   --   --  30.3*  --   --  29.4*  PLT 331  --   --   --  274  --   --  266  APTT 25  --   --   --   --   --   --   --   LABPROT 15.1  --   --   --   --   --   --   --   INR 1.2  --   --   --   --   --   --   --   HEPARINUNFRC  --    < >  --  0.77*  --  0.72* 0.68 0.64  CREATININE 4.46*  --  4.31*  --  4.29*  --   --  3.83*  TROPONINIHS 341*  --  275* 232*  --   --   --   --    < > = values in this interval not displayed.    Estimated Creatinine Clearance: 18.4 mL/min (A) (by C-G formula based on SCr of 3.83 mg/dL (H)).   Medical History: Past Medical History:  Diagnosis Date   Arthritis    Chronic kidney disease    Complication of anesthesia    constipation and inability to  urinate happened a few days after cataract surgery    Diabetes (HCC)    type 2    Diverticulitis    GERD (gastroesophageal reflux disease)    Gout    Heart attack (HCC)    Heart disease    Heart murmur    Hyperglycemia    Hyperlipidemia    Hypertension    Osteoporosis    Pneumonia    hx of BOOP followed by Dr Sherene Sires    Sleep apnea    cpapp - setting at 17    Medications:  No AC prior to admission per med list and fill history  Baseline CBC appropriate to start heparin infusion. Baseline INR = 1.2, Aptt 25  Assessment: Patient admitted to ER with shortness of breath. PMH includes hypertension, diabetes, hyperlipidemia, gout, CAD status post CABG in 1997, GERD, polycystic kidney disease, and chronic kidney disease stage IV. Also noted dx of idiopathic pulmonary disease with recent discharge from  [facility] on 12/20.  --12/23 1145 = 0.72, supratherapeutic --12/23 2211 HL 0.68, therapeutic x 1 --12/24 0537 HL 0.64.   Goal of Therapy:  Heparin level 0.3-0.7 units/ml Monitor platelets by anticoagulation protocol: Yes   Plan:  Heparin level is therapeutic. Will continue heparin infusion at 1000 units/hr. Recheck heparin level and CBC with AM labs. Plan for heparin x 48 hours.   Paschal Dopp, PharmD, BCPS 09/01/2023 7:33 AM

## 2023-09-02 DIAGNOSIS — J9601 Acute respiratory failure with hypoxia: Secondary | ICD-10-CM | POA: Diagnosis not present

## 2023-09-02 LAB — BASIC METABOLIC PANEL
Anion gap: 14 (ref 5–15)
BUN: 92 mg/dL — ABNORMAL HIGH (ref 8–23)
CO2: 25 mmol/L (ref 22–32)
Calcium: 8.5 mg/dL — ABNORMAL LOW (ref 8.9–10.3)
Chloride: 100 mmol/L (ref 98–111)
Creatinine, Ser: 3.76 mg/dL — ABNORMAL HIGH (ref 0.61–1.24)
GFR, Estimated: 16 mL/min — ABNORMAL LOW (ref >60)
Glucose, Bld: 218 mg/dL — ABNORMAL HIGH (ref 70–99)
Potassium: 4 mmol/L (ref 3.5–5.1)
Sodium: 139 mmol/L (ref 135–145)

## 2023-09-02 LAB — GLUCOSE, CAPILLARY
Glucose-Capillary: 277 mg/dL — ABNORMAL HIGH (ref 70–99)
Glucose-Capillary: 158 mg/dL — ABNORMAL HIGH (ref 70–99)
Glucose-Capillary: 311 mg/dL — ABNORMAL HIGH (ref 70–99)
Glucose-Capillary: 418 mg/dL — ABNORMAL HIGH (ref 70–99)
Glucose-Capillary: 247 mg/dL — ABNORMAL HIGH (ref 70–99)

## 2023-09-02 LAB — CBC
HCT: 26.8 % — ABNORMAL LOW (ref 39.0–52.0)
Hemoglobin: 8.7 g/dL — ABNORMAL LOW (ref 13.0–17.0)
MCH: 32.5 pg (ref 26.0–34.0)
MCHC: 32.5 g/dL (ref 30.0–36.0)
MCV: 100 fL (ref 80.0–100.0)
Platelets: 239 10*3/uL (ref 150–400)
RBC: 2.68 MIL/uL — ABNORMAL LOW (ref 4.22–5.81)
RDW: 14.9 % (ref 11.5–15.5)
WBC: 13.7 10*3/uL — ABNORMAL HIGH (ref 4.0–10.5)
nRBC: 0.5 % — ABNORMAL HIGH (ref 0.0–0.2)

## 2023-09-02 LAB — CULTURE, RESPIRATORY W GRAM STAIN

## 2023-09-02 LAB — RHEUMATOID FACTOR: Rheumatoid fact SerPl-aCnc: 10.4 [IU]/mL (ref <14.0)

## 2023-09-02 LAB — GLUCOSE, RANDOM: Glucose, Bld: 428 mg/dL — ABNORMAL HIGH (ref 70–99)

## 2023-09-02 LAB — MAGNESIUM: Magnesium: 2.3 mg/dL (ref 1.7–2.4)

## 2023-09-02 MED ORDER — CYCLOBENZAPRINE HCL 10 MG PO TABS
10.0000 mg | ORAL_TABLET | Freq: Every day | ORAL | Status: DC
  Administered 2023-09-02 – 2023-09-03 (×2): 10 mg via ORAL
  Filled 2023-09-02 (×2): qty 1

## 2023-09-02 MED ORDER — LINEZOLID 600 MG PO TABS
600.0000 mg | ORAL_TABLET | Freq: Two times a day (BID) | ORAL | Status: DC
  Administered 2023-09-02 – 2023-09-06 (×9): 600 mg via ORAL
  Filled 2023-09-02 (×9): qty 1

## 2023-09-02 MED ORDER — INSULIN ASPART 100 UNIT/ML IJ SOLN
5.0000 [IU] | Freq: Three times a day (TID) | INTRAMUSCULAR | Status: DC
Start: 2023-09-02 — End: 2023-09-06
  Administered 2023-09-02 – 2023-09-06 (×11): 5 [IU] via SUBCUTANEOUS
  Filled 2023-09-02 (×11): qty 1

## 2023-09-02 MED ORDER — INSULIN ASPART 100 UNIT/ML IJ SOLN
4.0000 [IU] | Freq: Once | INTRAMUSCULAR | Status: AC
  Administered 2023-09-02: 4 [IU] via SUBCUTANEOUS
  Filled 2023-09-02: qty 1

## 2023-09-02 MED ORDER — INSULIN ISOPHANE & REGULAR (HUMAN 70-30)100 UNIT/ML KWIKPEN: 30.0000 [IU] | PEN_INJECTOR | Freq: Every day | SUBCUTANEOUS | Status: DC

## 2023-09-02 MED ORDER — INSULIN ASPART PROT & ASPART (70-30 MIX) 100 UNIT/ML ~~LOC~~ SUSP
30.0000 [IU] | Freq: Every day | SUBCUTANEOUS | Status: DC
  Administered 2023-09-02 – 2023-09-06 (×5): 30 [IU] via SUBCUTANEOUS
  Filled 2023-09-02: qty 10

## 2023-09-02 NOTE — Progress Notes (Signed)
NAME:  Kurt Aguilar, MRN:  [id number], DOB:  03/08/1946, LOS: 3 ADMISSION DATE:  08/30/2023  History of Present Illness:  [name] is a 77 year old male patient with a past medical history of CAD status post CABG in 1997, moderate mitral valve stenosis, severe tracheobronchomalacia, CKD stage IV, diabetes mellitus type 2, hypertension, hyperlipidemia and OSA on CPAP who is presenting to [facility] on 12/22 with acute hypoxic respiratory failure.  He was recently admitted to [facility] few days ago with acute hypoxic respiratory failure.  Symptoms started few days before his initial presentation with fever up to 101 after coming back from a cruise.  He was treated for community-acquired pneumonia with meropenem vancomycin and azithromycin.  He was discharged home on oxygen.  He comes back now with ongoing hypoxic respiratory failure worsening cough with sputum production.  CT chest with worsening multilobar pneumonia specifically at the right lung base in the posterior left lower lobe.  Respiratory viral panel 12/22 positive for rhinovirus.  Sputum culture 12/22 with few gram-positive cocci in pairs and clusters.  Aspergillus antigen 12/14 negative.  Beta D glucan pending, ANA Pending, ANCA Pending, RF pending   He is also being managed and evaluated for NSTEMI.  Also found with worsening kidney function from a baseline of 2.2 mg/dL now with creatinine of 4.3 mg/dL.  Echocardiogram 12/23 - Normal EF 60%, no rwma, RV is normal, PASP normal, no significant mitral valve disease.   Pertinent  Medical History  As above.  Interim History / Subjective:  Sitting in chair eating, good appetite. Still has significant dyspnea on exertion aongoing cough with grayish dark sputum.  Sputum culture with MRSE.   Objective   Blood pressure 124/78, pulse 87, temperature 98 F (36.7 C), temperature source Oral, resp. rate 19, height 5\' 7"  (1.702 m), weight  108.5 kg, SpO2 98%.        Intake/Output Summary (Last 24 hours) at 09/02/2023 1224 Last data filed at 09/02/2023 0757 Gross per 24 hour  Intake --  Output 1300 ml  Net -1300 ml   Filed Weights   09/01/23 0229  Weight: 108.5 kg    GEN no acute distress HEENT supple neck, reactive pupils Pulmonary faint wheezing heard bilaterally CVS normal S1, normal S2, regular rate and rhythm Abdomen soft nontender nondistended positive bowel sounds Extremities warm well-perfused no edema  Sputum culture with moderate staph epidermidis (MRSE). MRSA negative.   Assessment & Plan:  Case of a 77 year old male patient chronically immunosuppressed on steroid for presumed OP presenting for worsening shortness of breath found to have rhinovirus + and few gram + Cocci in clusters in sputum.   #Acute hypoxic respiratory failure secondary to... #Rhinovirus pneumonia with superimposed bacterial pneumonia sputum culture with MRSE. Opportunistic infection is not ruled out., Pending fungitell. Asp Ag negative.  #TBM (EDAC) #NSTEMI likelt type II pending eval, currently on heparin drip.  #AKI on CKD in the setting of sepsis?   Plan  []  Start Linezolid to cover MRSE. Ok to dc Cefepime. Would treat for a total of 14 days.  []  prednisone to 40mg  PO daily for now might need a prolonged taper to baseline.  []  Follow up Beta-glucan, ANCA, ANA, RF.  []  Pulmonary hygiene with 3% hypertonic saline neb preceded by Albuterol neb Q8H. Followed by Flutter valve use and IS. cpap 5-15 at bedtime.   I spent 50 minutes caring for this patient today, including preparing to see the patient, obtaining  a medical history , reviewing a separately obtained history, performing a medically appropriate examination and/or evaluation, counseling and educating the patient/family/caregiver, documenting clinical information in the electronic health record, and independently interpreting results (not separately reported/billed) and  communicating results to the patient/family/caregiver  Janann Colonel, MD [facility] 09/02/2023 12:24 PM

## 2023-09-02 NOTE — Progress Notes (Signed)
[facility]  ROUNDING NOTE   Subjective:   Kurt Aguilar is a 77 year old male with past medical conditions including hypertension, diabetes, hyperlipidemia, gout, CAD status post CABG in 1997, GERD, polycystic kidney disease, and chronic kidney disease stage IV.  Patient presented to the emergency department with shortness of breath and was admitted at that time for Transaminitis [R74.01] AKI (acute kidney injury) (HCC) [N17.9] HCAP (healthcare-associated pneumonia) [J18.9] Sepsis, due to unspecified organism, unspecified whether acute organ dysfunction present (HCC) [A41.9] Acute hypoxic respiratory failure (HCC) [J96.01]  Patient is known to our practice and is followed outpatient by Dr. Thedore Mins.  Patient recently admitted for pneumonia.   Creatinine slightly better to 3.76. Resting comfortably in bed.  Objective:  Vital signs in last 24 hours:  Temp:  [97.7 F (36.5 C)-98.2 F (36.8 C)] 98 F (36.7 C) (12/25 1148) Pulse Rate:  [87-103] 87 (12/25 0400) Resp:  [19-24] 19 (12/25 0400) BP: (103-134)/(66-90) 124/78 (12/25 1148) SpO2:  [92 %-100 %] 98 % (12/25 0400)  Weight change:  Filed Weights   09/01/23 0229  Weight: 108.5 kg     Intake/Output: I/O last 3 completed shifts: In: 56.8 [I.V.:56.8] Out: 1150 [Urine:1150]   Intake/Output this shift:  Total I/O In: -  Out: 550 [Urine:550]  Physical Exam: General: NAD  Head: Normocephalic, atraumatic. Moist oral mucosal membranes  Eyes: Anicteric  Lungs:  Basilar crackles, normal effort, NCO 2  Heart: Regular rate and rhythm  Abdomen:  Soft, nontender, obese  Extremities: 1+ peripheral edema.  Neurologic: Alert and oriented, moving all four extremities  Skin: No lesions       Basic Metabolic Panel: Recent Labs  Lab 08/28/23 0455 08/30/23 1328 08/30/23 2049 08/31/23 0144 09/01/23 0537 09/02/23 0435  NA 143 140 140 140 137 139  K 4.6 4.6 4.5 4.3 3.8 4.0  CL 101 93* 96* 97* 97* 100  CO2 27 31  28 28 24 25   GLUCOSE 117* 224* 208* 293* 225* 218*  BUN 86* 94* 88* 88* 86* 92*  CREATININE 3.73* 4.46* 4.31* 4.29* 3.83* 3.76*  CALCIUM 9.0 9.5 9.0 8.8* 8.4* 8.5*  MG 2.9*  --   --   --  2.2 2.3    Liver Function Tests: Recent Labs  Lab 08/30/23 1328 08/31/23 0144  AST 257* 140*  ALT 403* 284*  ALKPHOS 184* 145*  BILITOT 0.8 0.8  PROT 7.1 6.1*  ALBUMIN 3.0* 2.6*    No results for input(s): "LIPASE", "AMYLASE" in the last 168 hours. No results for input(s): "AMMONIA" in the last 168 hours.  CBC: Recent Labs  Lab 08/28/23 0455 08/30/23 1328 08/31/23 0144 09/01/23 0537 09/02/23 0435  WBC 7.2 13.5* 12.7* 14.0* 13.7*  NEUTROABS  --  10.8* 10.2*  --   --   HGB 10.1* 10.8* 9.8* 9.6* 8.7*  HCT 31.1* 34.4* 30.3* 29.4* 26.8*  MCV 101.0* 102.7* 100.3* 99.3 100.0  PLT 292 331 274 266 239    Cardiac Enzymes: No results for input(s): "CKTOTAL", "CKMB", "CKMBINDEX", "TROPONINI" in the last 168 hours.  BNP: Invalid input(s): "POCBNP"  CBG: Recent Labs  Lab 09/01/23 1729 09/01/23 2135 09/02/23 0117 09/02/23 0754 09/02/23 1151  GLUCAP 220* 309* 277* 158* 311*    Microbiology: Results for orders placed or performed during the hospital encounter of 08/30/23  Resp panel by RT-PCR (RSV, Flu A&B, Covid) Anterior Nasal Swab     Status: None   Collection Time: 08/30/23  1:28 PM   Specimen: Anterior Nasal Swab  Result  Value Ref Range Status   SARS Coronavirus 2 by RT PCR NEGATIVE NEGATIVE Final    Comment: (NOTE) SARS-CoV-2 target nucleic acids are NOT DETECTED.  The SARS-CoV-2 RNA is generally detectable in upper respiratory specimens during the acute phase of infection. The lowest concentration of SARS-CoV-2 viral copies this assay can detect is 138 copies/mL. A negative result does not preclude SARS-Cov-2 infection and should not be used as the sole basis for treatment or other patient management decisions. A negative result may occur with  improper specimen  collection/handling, submission of specimen other than nasopharyngeal swab, presence of viral mutation(s) within the areas targeted by this assay, and inadequate number of viral copies(<138 copies/mL). A negative result must be combined with clinical observations, patient history, and epidemiological information. The expected result is Negative.  Fact Sheet for Patients:  BloggerCourse.com  Fact Sheet for Healthcare Providers:  SeriousBroker.it  This test is no t yet approved or cleared by the [place] FDA and  has been authorized for detection and/or diagnosis of SARS-CoV-2 by FDA under an Emergency Use Authorization (EUA). This EUA will remain  in effect (meaning this test can be used) for the duration of the COVID-19 declaration under Section 564(b)(1) of the Act, 21 U.S.C.section 360bbb-3(b)(1), unless the authorization is terminated  or revoked sooner.       Influenza A by PCR NEGATIVE NEGATIVE Final   Influenza B by PCR NEGATIVE NEGATIVE Final    Comment: (NOTE) The Xpert Xpress SARS-CoV-2/FLU/RSV plus assay is intended as an aid in the diagnosis of influenza from Nasopharyngeal swab specimens and should not be used as a sole basis for treatment. Nasal washings and aspirates are unacceptable for Xpert Xpress SARS-CoV-2/FLU/RSV testing.  Fact Sheet for Patients: BloggerCourse.com  Fact Sheet for Healthcare Providers: SeriousBroker.it  This test is not yet approved or cleared by the [place] FDA and has been authorized for detection and/or diagnosis of SARS-CoV-2 by FDA under an Emergency Use Authorization (EUA). This EUA will remain in effect (meaning this test can be used) for the duration of the COVID-19 declaration under Section 564(b)(1) of the Act, 21 U.S.C. section 360bbb-3(b)(1), unless the authorization is terminated or revoked.     Resp Syncytial  Virus by PCR NEGATIVE NEGATIVE Final    Comment: (NOTE) Fact Sheet for Patients: BloggerCourse.com  Fact Sheet for Healthcare Providers: SeriousBroker.it  This test is not yet approved or cleared by the [place] FDA and has been authorized for detection and/or diagnosis of SARS-CoV-2 by FDA under an Emergency Use Authorization (EUA). This EUA will remain in effect (meaning this test can be used) for the duration of the COVID-19 declaration under Section 564(b)(1) of the Act, 21 U.S.C. section 360bbb-3(b)(1), unless the authorization is terminated or revoked.  Performed at [facility], [address] [place], [place] [address]   Blood Culture (routine x 2)     Status: None (Preliminary result)   Collection Time: 08/30/23  2:02 PM   Specimen: BLOOD  Result Value Ref Range Status   Specimen Description   Final    BLOOD BLOOD RIGHT ARM Performed at [facility], [address], [place], [place] [address]    Special Requests   Final    BOTTLES DRAWN AEROBIC AND ANAEROBIC Blood Culture adequate volume Performed at [facility], [address], [place], [place] [address]    Culture   Final    NO GROWTH 3 DAYS Performed at [facility], [address]  [place], [place] [address]    Report Status PENDING  Incomplete  Blood Culture (routine x 2)     Status: None (Preliminary result)   Collection Time: 08/30/23  2:02 PM   Specimen: BLOOD  Result Value Ref Range Status   Specimen Description   Final    BLOOD BLOOD RIGHT ARM Performed at [facility], [address] [place], [place] [address]    Special Requests   Final    BOTTLES DRAWN AEROBIC AND ANAEROBIC Blood Culture adequate volume Performed at [facility], [address] [place], [place] [address]    Culture   Final    NO GROWTH 3 DAYS Performed at [facility], [address], [place], [place]  [address]    Report Status PENDING  Incomplete  Respiratory (~20 pathogens) panel by PCR     Status: Abnormal   Collection Time: 08/30/23  3:47 PM   Specimen: Nasopharyngeal Swab; Respiratory  Result Value Ref Range Status   Adenovirus NOT DETECTED NOT DETECTED Final   Coronavirus 229E NOT DETECTED NOT DETECTED Final    Comment: (NOTE) The Coronavirus on the Respiratory Panel, DOES NOT test for the novel  Coronavirus (2019 nCoV)    Coronavirus HKU1 NOT DETECTED NOT DETECTED Final   Coronavirus NL63 NOT DETECTED NOT DETECTED Final   Coronavirus OC43 NOT DETECTED NOT DETECTED Final   Metapneumovirus NOT DETECTED NOT DETECTED Final   Rhinovirus / Enterovirus DETECTED (A) NOT DETECTED Final   Influenza A NOT DETECTED NOT DETECTED Final   Influenza B NOT DETECTED NOT DETECTED Final   Parainfluenza Virus 1 NOT DETECTED NOT DETECTED Final   Parainfluenza Virus 2 NOT DETECTED NOT DETECTED Final   Parainfluenza Virus 3 NOT DETECTED NOT DETECTED Final   Parainfluenza Virus 4 NOT DETECTED NOT DETECTED Final   Respiratory Syncytial Virus NOT DETECTED NOT DETECTED Final   Bordetella pertussis NOT DETECTED NOT DETECTED Final   Bordetella Parapertussis NOT DETECTED NOT DETECTED Final   Chlamydophila pneumoniae NOT DETECTED NOT DETECTED Final   Mycoplasma pneumoniae NOT DETECTED NOT DETECTED Final    Comment: Performed at [facility], [address], [place], [place] [address]  Expectorated Sputum Assessment w Gram Stain, Rflx to Resp Cult     Status: None   Collection Time: 08/30/23  4:25 PM   Specimen: Sputum  Result Value Ref Range Status   Specimen Description SPUTUM  Final   Special Requests NONE  Final   Sputum evaluation   Final    THIS SPECIMEN IS ACCEPTABLE FOR SPUTUM CULTURE Performed at [facility], [address] [place], [place] [address]    Report Status 08/30/2023 FINAL  Final  Culture, Respiratory w Gram Stain     Status: None   Collection Time: 08/30/23  4:25  PM   Specimen: SPU  Result Value Ref Range Status   Specimen Description   Final    SPUTUM Performed at [facility], [address], [place], [place] [address]    Special Requests   Final    NONE Reflexed from [id number] Performed at [facility], [address] [place], [place] [address]    Gram Stain   Final    RARE SQUAMOUS EPITHELIAL CELLS PRESENT FEW WBC PRESENT,BOTH PMN AND MONONUCLEAR FEW GRAM POSITIVE COCCI IN PAIRS RARE GRAM POSITIVE COCCI IN CLUSTERS Performed at [facility], [address], [place], [place] [address]    Culture MODERATE STAPHYLOCOCCUS EPIDERMIDIS  Final  Report Status 09/02/2023 FINAL  Final   Organism ID, Bacteria STAPHYLOCOCCUS EPIDERMIDIS  Final      Susceptibility   Staphylococcus epidermidis - MIC*    CIPROFLOXACIN <=0.5 SENSITIVE Sensitive     ERYTHROMYCIN >=8 RESISTANT Resistant     GENTAMICIN 4 SENSITIVE Sensitive     OXACILLIN >=4 RESISTANT Resistant     TETRACYCLINE >=16 RESISTANT Resistant     VANCOMYCIN 1 SENSITIVE Sensitive     TRIMETH/SULFA 80 RESISTANT Resistant     CLINDAMYCIN >=8 RESISTANT Resistant     RIFAMPIN <=0.5 SENSITIVE Sensitive     Inducible Clindamycin NEGATIVE Sensitive     * MODERATE STAPHYLOCOCCUS EPIDERMIDIS  MRSA Next Gen by PCR, Nasal     Status: None   Collection Time: 09/01/23  2:51 AM   Specimen: Nasal Mucosa; Nasal Swab  Result Value Ref Range Status   MRSA by PCR Next Gen NOT DETECTED NOT DETECTED Final    Comment: (NOTE) The GeneXpert MRSA Assay (FDA approved for NASAL specimens only), is one component of a comprehensive MRSA colonization surveillance program. It is not intended to diagnose MRSA infection nor to guide or monitor treatment for MRSA infections. Test performance is not FDA approved in patients less than 24 years old. Performed at [facility], [address] [place], [place] [address]     Coagulation Studies: No results for input(s): "LABPROT",  "INR" in the last 72 hours.   Urinalysis: Recent Labs    08/30/23 1626  COLORURINE STRAW*  LABSPEC 1.006  PHURINE 5.0  GLUCOSEU NEGATIVE  HGBUR SMALL*  BILIRUBINUR NEGATIVE  KETONESUR NEGATIVE  PROTEINUR NEGATIVE  NITRITE NEGATIVE  LEUKOCYTESUR NEGATIVE      Imaging: No results found.    Medications:       aspirin EC  81 mg Oral Daily   cyanocobalamin  500 mcg Oral Daily   cyclobenzaprine  10 mg Oral QHS   ezetimibe  10 mg Oral Daily   famotidine  20 mg Oral QHS   gabapentin  100 mg Oral QHS   heparin  5,000 Units Subcutaneous Q8H   insulin aspart  0-15 Units Subcutaneous TID WC   ipratropium-albuterol  3 mL Nebulization BID   linezolid  600 mg Oral Q12H   predniSONE  40 mg Oral Q breakfast   senna-docusate  1 tablet Oral QHS   sodium chloride flush  3 mL Intravenous Q12H   sodium chloride HYPERTONIC  4 mL Nebulization BID   acetaminophen **OR** acetaminophen, nitroGLYCERIN, ondansetron **OR** ondansetron (ZOFRAN) IV, polyethylene glycol  Assessment/ Plan:  [name] is a 77 y.o.  male with past medical conditions including hypertension, diabetes, hyperlipidemia, gout, CAD status post CABG in 1997, GERD, polycystic kidney disease, and chronic kidney disease stage IV.  Patient presented to the emergency department with shortness of breath and was admitted at that time for Transaminitis [R74.01] AKI (acute kidney injury) (HCC) [N17.9] HCAP (healthcare-associated pneumonia) [J18.9] Sepsis, due to unspecified organism, unspecified whether acute organ dysfunction present [facility]) [A41.9] Acute hypoxic respiratory failure (HCC) [J96.01]   Acute Kidney Injury on chronic kidney disease stage IV with baseline creatinine 2.23 on 04/15/23.  Acute kidney injury secondary to volume overload.  IV contrast exposure on 08/31/2023. Chronic kidney disease is multifactorial: Diabetes and polycystic kidney disease, hypertension, obesity Patient recently discharged with  creatinine 3.73, currently admitted with initial creatinine of 4.46.   Update: Creatinine down to 3.76.  Diuretics held.  Continue to monitor renal parameters daily for now.  No immediate need for dialysis.   Lab Results  Component Value Date   CREATININE 3.76 (H) 09/02/2023   CREATININE 3.83 (H) 09/01/2023   CREATININE 4.29 (H) 08/31/2023    Intake/Output Summary (Last 24 hours) at 09/02/2023 1529 Last data filed at 09/02/2023 0757 Gross per 24 hour  Intake --  Output 1300 ml  Net -1300 ml    2. Anemia of chronic kidney disease Lab Results  Component Value Date   HGB 8.7 (L) 09/02/2023    Hemoglobin down slightly to 8.7.  Continue to monitor.  May need Epogen as outpatient.  3. Secondary Hyperparathyroidism: with outpatient labs: PTH 73, phosphorus 4.2, calcium 9.7 on 08/18/23.   Lab Results  Component Value Date   CALCIUM 8.5 (L) 09/02/2023   CAION 1.15 09/17/2021   PHOS 3.4 09/12/2016    Continue to monitor bone metabolism parameters.  4. Diabetes mellitus type II with chronic kidney disease/renal manifestations: insulin dependent. Home regimen includes semaglutide, Novolin 70/30 and lispro. Most recent hemoglobin A1c is 6.3 on 05/08/23.   Glucose remains elevated at times.  Primary team to manage SSI    LOS: 3 Thaddius Manes 12/25/20243:29 PM

## 2023-09-02 NOTE — Progress Notes (Signed)
PROGRESS NOTE    Kurt Aguilar  WGN:[id number] DOB: Oct 05, 1945 DOA: 08/30/2023 PCP: Judy Pimple, MD  [id number]  LOS: 3 days   Brief hospital course:   Assessment & Plan:  SULEIMAN VACANTI is a 77 y.o. 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   Kurt Aguilar was recently admitted from 12/12 to 12/20 for acute hypoxic respiratory failure in the setting of tracheobronchomalacia and suspected LLL pneumonia + AKI on CKD 2/2 contrast exposure.  He was discharged to SNF on 12/20.   * Acute hypoxic respiratory failure (HCC) 2/2 Rhinovirus pneumonia with superimposed possible bacterial pneumonia with MRSE Patient is presenting with AMS morning of presentation in the setting of hypoxia that resolved on placement of supplemental oxygen.  He was recently admitted for pneumonia and received 5 days of meropenem and azithromycin with additional 2 days of Rocephin.  Pulmonology has been consulted and recommending treatment for potential hospital associated pneumonia.  Hypoxia may also be related to underlying NSTEMI/HF given slightly enlarged pleural effusions in addition to ongoing tracheobronchomalacia. --Start Linezolid to cover MRSE. Would treat for a total of 14 days.  --d/c cefepime --cont prednisone at increased 40 mg daily --Pulmonary hygiene with 3% hypertonic saline neb preceded by Albuterol neb Q8H. Followed by Flutter valve use and IS. Consider cpap 5-15 at bedtime.   Trop elevation 2/2 demand ischemia NSTEMI ruled out --trop peaked at 341.  Echo without wall motion abnormalities.    CAD s/p CABG --cont ASA and ezetimibe - Consider ischemic evaluation as outpatient   Tracheobronchomalacia Chronic steroid Per chart review, patient has a  previous history of BOOP with evaluation by pulmonology on last admission noting clinically and radiologically more consistent with tracheobronchomalacia. --has been on chronic prednisone 20 mg daily for many years. - Pulmonology consulted --cont prednisone at increased 40 mg daily.    Chronic heart failure with preserved ejection fraction (HCC) Although patient does not appear significantly hypervolemic on examination, he is noted to have slightly increased pleural effusions on CT imaging and his BNP is increased compared to prior at 336.  Given AKI, he received small IV bolus of fluids.  Will hold off on diuretics at this time and recheck renal function in the evening.  If evidence of worsening renal function, will trial a dose of Lasix, given the possibility this may be cardiorenal in nature. --hold home torsemide  Acute kidney injury superimposed on chronic kidney disease 4 (HCC) Patient has a history of CKD stage IV contributed by Diabetes and polycystic kidney disease, hypertension, obesity.  With AKI on most recent admission.  On discharge, his creatinine was noted to be slowly rising as high as 3.73.  On current presentation, 4.46.   --Cr improving without IVF - Nephrology consulted --hold home torsemide  Elevated LFTs Unclear etiology at this time.  No prior history of cirrhosis, and INR and platelets are within normal limits.  He is not experiencing any abdominal pain to suggest cholecystitis.  No reported alcohol use.   - Right upper quadrant ultrasound no acute finding - Hold home statin  Type 2 diabetes mellitus (HCC) --resume home 70/30 insulin 30 u daily --add mealtime 5u TID - SSI, moderate  Neuropathy - Decrease home gabapentin at bedtime from 300 mg to 100 mg given AKI  Essential  hypertension - Hold home antihypertensives in the setting of high risk for hypotension.  Obstructive sleep apnea - CPAP at bedtime   DVT prophylaxis: Heparin SQ Code Status: DNR  Family  Communication:  Level of care: Med-Surg Dispo:   The patient is from: SNF rehab Anticipated d/c is to: SNF rehab Anticipated d/c date is: > 3 days   Subjective and Interval History:  Pt still couldn't sleep.  Complained of neck and shoulder pain, and intermittent cramps in his legs.  Severe DOE.   Objective: Vitals:   09/02/23 0400 09/02/23 0756 09/02/23 1148 09/02/23 1554  BP: 119/80 120/77 124/78 132/81  Pulse: 87     Resp: 19     Temp: 98.2 F (36.8 C) 97.7 F (36.5 C) 98 F (36.7 C) 98 F (36.7 C)  TempSrc: Oral Oral Oral Oral  SpO2: 98%     Weight:      Height:        Intake/Output Summary (Last 24 hours) at 09/02/2023 1625 Last data filed at 09/02/2023 0757 Gross per 24 hour  Intake --  Output 1300 ml  Net -1300 ml   Filed Weights   09/01/23 0229  Weight: 108.5 kg    Examination:   Constitutional: NAD, AAOx3 HEENT: conjunctivae and lids normal, EOMI CV: No cyanosis.   RESP: normal respiratory effort at rest Extremities: No effusions, edema in BLE SKIN: warm, dry Neuro: II - XII grossly intact.   Psych: Normal mood and affect.  Appropriate judgement and reason   Data Reviewed: I have personally reviewed labs and imaging studies  Time spent: 35 minutes  Darlin Priestly, MD [facility] If 7PM-7AM, please contact night-coverage 09/02/2023, 4:25 PM

## 2023-09-03 ENCOUNTER — Other Ambulatory Visit (HOSPITAL_COMMUNITY): Payer: Self-pay

## 2023-09-03 ENCOUNTER — Telehealth (HOSPITAL_COMMUNITY): Payer: Self-pay | Admitting: Pharmacy Technician

## 2023-09-03 DIAGNOSIS — J9601 Acute respiratory failure with hypoxia: Secondary | ICD-10-CM | POA: Diagnosis not present

## 2023-09-03 LAB — BASIC METABOLIC PANEL
Anion gap: 14 (ref 5–15)
BUN: 88 mg/dL — ABNORMAL HIGH (ref 8–23)
CO2: 25 mmol/L (ref 22–32)
Calcium: 8.8 mg/dL — ABNORMAL LOW (ref 8.9–10.3)
Chloride: 101 mmol/L (ref 98–111)
Creatinine, Ser: 3.7 mg/dL — ABNORMAL HIGH (ref 0.61–1.24)
GFR, Estimated: 16 mL/min — ABNORMAL LOW (ref >60)
Glucose, Bld: 156 mg/dL — ABNORMAL HIGH (ref 70–99)
Potassium: 3.8 mmol/L (ref 3.5–5.1)
Sodium: 140 mmol/L (ref 135–145)

## 2023-09-03 LAB — GLUCOSE, CAPILLARY
Glucose-Capillary: 153 mg/dL — ABNORMAL HIGH (ref 70–99)
Glucose-Capillary: 149 mg/dL — ABNORMAL HIGH (ref 70–99)
Glucose-Capillary: 168 mg/dL — ABNORMAL HIGH (ref 70–99)
Glucose-Capillary: 220 mg/dL — ABNORMAL HIGH (ref 70–99)

## 2023-09-03 LAB — CBC
HCT: 27.9 % — ABNORMAL LOW (ref 39.0–52.0)
Hemoglobin: 9 g/dL — ABNORMAL LOW (ref 13.0–17.0)
MCH: 32.7 pg (ref 26.0–34.0)
MCHC: 32.3 g/dL (ref 30.0–36.0)
MCV: 101.5 fL — ABNORMAL HIGH (ref 80.0–100.0)
Platelets: 247 10*3/uL (ref 150–400)
RBC: 2.75 MIL/uL — ABNORMAL LOW (ref 4.22–5.81)
RDW: 15 % (ref 11.5–15.5)
WBC: 15.2 10*3/uL — ABNORMAL HIGH (ref 4.0–10.5)
nRBC: 0.7 % — ABNORMAL HIGH (ref 0.0–0.2)

## 2023-09-03 LAB — ANA W/REFLEX: Anti Nuclear Antibody (ANA): NEGATIVE

## 2023-09-03 MED ORDER — PHENYLEPHRINE HCL 0.5 % NA SOLN
4.0000 [drp] | Freq: Once | NASAL | Status: AC
  Administered 2023-09-03: 4 [drp] via NASAL
  Filled 2023-09-03: qty 15

## 2023-09-03 MED ORDER — QUETIAPINE FUMARATE 25 MG PO TABS
100.0000 mg | ORAL_TABLET | Freq: Every day | ORAL | Status: DC
  Administered 2023-09-03 – 2023-09-05 (×3): 100 mg via ORAL
  Filled 2023-09-03 (×3): qty 4

## 2023-09-03 NOTE — Progress Notes (Signed)
       CROSS COVER NOTE  NAME: Kurt Aguilar MRN: [id number] DOB : Feb 25, 1946    Concern as stated by nurse / staff    Message received from Chrissie Noa RN via secure chat  I have a 77 y/o male DNR that was admitted for acute hypoxic Resp failure on 12/22. Hx of tracheobronchomalacia, CAD, CABG, CKD4, HTN, HLD, DM2, OSA. He developed epistaxis around shift change. He is on heparin sq every 8 hours. The bleeding is moderate and I am controlling it with gauze. VS: BP 149/92, MAP 110, HR 94, SpaO2 95% RA RR 16. Just wanted to give you a heads up.   Pertinent findings on chart review: HGB 9, platelets 247  Assessment and  Interventions   Assessment:    09/03/2023    7:50 PM 09/03/2023    4:37 PM 09/03/2023    8:32 AM  Vitals with BMI  Systolic 149 142 956  Diastolic 92 89 82  Pulse 94 95 91   No history of nose bleeds per patient On CPAP at night Moderate amount of bleeding from left nares only Plan: Afrin nasal spray to left nares x 1 dose       Donnie Mesa NP [facility] Cross Cover 7pm-7am - check amion for availability Pager [phone]

## 2023-09-03 NOTE — Progress Notes (Signed)
[facility]  ROUNDING NOTE   Subjective:   Kurt Aguilar is a 77 year old male with past medical conditions including hypertension, diabetes, hyperlipidemia, gout, CAD status post CABG in 1997, GERD, polycystic kidney disease, and chronic kidney disease stage IV.  Patient presented to the emergency department with shortness of breath and was admitted at that time for Transaminitis [R74.01] AKI (acute kidney injury) (HCC) [N17.9] HCAP (healthcare-associated pneumonia) [J18.9] Sepsis, due to unspecified organism, unspecified whether acute organ dysfunction present (HCC) [A41.9] Acute hypoxic respiratory failure (HCC) [J96.01]  Patient is known to our practice and is followed outpatient by Dr. Thedore Mins.  Patient recently admitted for pneumonia.   Patient sitting up in chair Wife at bedside Room air Lower extremity edema remains  Creatinine 3.70 UOP  Objective:  Vital signs in last 24 hours:  Temp:  [97.5 F (36.4 C)-98.1 F (36.7 C)] 98.1 F (36.7 C) (12/26 1637) Pulse Rate:  [83-95] 95 (12/26 1637) Resp:  [16-26] 16 (12/26 1637) BP: (120-142)/(82-89) 142/89 (12/26 1637) SpO2:  [92 %-97 %] 92 % (12/26 1637)  Weight change:  Filed Weights   09/01/23 0229  Weight: 108.5 kg     Intake/Output: I/O last 3 completed shifts: In: 120 [P.O.:120] Out: 1275 [Urine:1275]   Intake/Output this shift:  Total I/O In: -  Out: 550 [Urine:550]  Physical Exam: General: NAD  Head: Normocephalic, atraumatic. Moist oral mucosal membranes  Eyes: Anicteric  Lungs:  Basilar crackles, normal effort, NCO 2  Heart: Regular rate and rhythm  Abdomen:  Soft, nontender, obese  Extremities: 1+ peripheral edema.  Neurologic: Alert and oriented, moving all four extremities  Skin: No lesions       Basic Metabolic Panel: Recent Labs  Lab 08/28/23 0455 08/30/23 1328 08/30/23 2049 08/31/23 0144 09/01/23 0537 09/02/23 0435 09/02/23 1614 09/03/23 0516  NA 143   < > 140  140 137 139  --  140  K 4.6   < > 4.5 4.3 3.8 4.0  --  3.8  CL 101   < > 96* 97* 97* 100  --  101  CO2 27   < > 28 28 24 25   --  25  GLUCOSE 117*   < > 208* 293* 225* 218* 428* 156*  BUN 86*   < > 88* 88* 86* 92*  --  88*  CREATININE 3.73*   < > 4.31* 4.29* 3.83* 3.76*  --  3.70*  CALCIUM 9.0   < > 9.0 8.8* 8.4* 8.5*  --  8.8*  MG 2.9*  --   --   --  2.2 2.3  --   --    < > = values in this interval not displayed.    Liver Function Tests: Recent Labs  Lab 08/30/23 1328 08/31/23 0144  AST 257* 140*  ALT 403* 284*  ALKPHOS 184* 145*  BILITOT 0.8 0.8  PROT 7.1 6.1*  ALBUMIN 3.0* 2.6*    No results for input(s): "LIPASE", "AMYLASE" in the last 168 hours. No results for input(s): "AMMONIA" in the last 168 hours.  CBC: Recent Labs  Lab 08/30/23 1328 08/31/23 0144 09/01/23 0537 09/02/23 0435 09/03/23 0516  WBC 13.5* 12.7* 14.0* 13.7* 15.2*  NEUTROABS 10.8* 10.2*  --   --   --   HGB 10.8* 9.8* 9.6* 8.7* 9.0*  HCT 34.4* 30.3* 29.4* 26.8* 27.9*  MCV 102.7* 100.3* 99.3 100.0 101.5*  PLT 331 274 266 239 247    Cardiac Enzymes: No results for input(s): "CKTOTAL", "CKMB", "  CKMBINDEX", "TROPONINI" in the last 168 hours.  BNP: Invalid input(s): "POCBNP"  CBG: Recent Labs  Lab 09/02/23 1151 09/02/23 1559 09/02/23 2047 09/03/23 1144 09/03/23 1635  GLUCAP 311* 418* 247* 149* 168*    Microbiology: Results for orders placed or performed during the hospital encounter of 08/30/23  Resp panel by RT-PCR (RSV, Flu A&B, Covid) Anterior Nasal Swab     Status: None   Collection Time: 08/30/23  1:28 PM   Specimen: Anterior Nasal Swab  Result Value Ref Range Status   SARS Coronavirus 2 by RT PCR NEGATIVE NEGATIVE Final    Comment: (NOTE) SARS-CoV-2 target nucleic acids are NOT DETECTED.  The SARS-CoV-2 RNA is generally detectable in upper respiratory specimens during the acute phase of infection. The lowest concentration of SARS-CoV-2 viral copies this assay can detect  is 138 copies/mL. A negative result does not preclude SARS-Cov-2 infection and should not be used as the sole basis for treatment or other patient management decisions. A negative result may occur with  improper specimen collection/handling, submission of specimen other than nasopharyngeal swab, presence of viral mutation(s) within the areas targeted by this assay, and inadequate number of viral copies(<138 copies/mL). A negative result must be combined with clinical observations, patient history, and epidemiological information. The expected result is Negative.  Fact Sheet for Patients:  BloggerCourse.com  Fact Sheet for Healthcare Providers:  SeriousBroker.it  This test is no t yet approved or cleared by the [place] FDA and  has been authorized for detection and/or diagnosis of SARS-CoV-2 by FDA under an Emergency Use Authorization (EUA). This EUA will remain  in effect (meaning this test can be used) for the duration of the COVID-19 declaration under Section 564(b)(1) of the Act, 21 U.S.C.section 360bbb-3(b)(1), unless the authorization is terminated  or revoked sooner.       Influenza A by PCR NEGATIVE NEGATIVE Final   Influenza B by PCR NEGATIVE NEGATIVE Final    Comment: (NOTE) The Xpert Xpress SARS-CoV-2/FLU/RSV plus assay is intended as an aid in the diagnosis of influenza from Nasopharyngeal swab specimens and should not be used as a sole basis for treatment. Nasal washings and aspirates are unacceptable for Xpert Xpress SARS-CoV-2/FLU/RSV testing.  Fact Sheet for Patients: BloggerCourse.com  Fact Sheet for Healthcare Providers: SeriousBroker.it  This test is not yet approved or cleared by the [place] FDA and has been authorized for detection and/or diagnosis of SARS-CoV-2 by FDA under an Emergency Use Authorization (EUA). This EUA will remain in effect  (meaning this test can be used) for the duration of the COVID-19 declaration under Section 564(b)(1) of the Act, 21 U.S.C. section 360bbb-3(b)(1), unless the authorization is terminated or revoked.     Resp Syncytial Virus by PCR NEGATIVE NEGATIVE Final    Comment: (NOTE) Fact Sheet for Patients: BloggerCourse.com  Fact Sheet for Healthcare Providers: SeriousBroker.it  This test is not yet approved or cleared by the [place] FDA and has been authorized for detection and/or diagnosis of SARS-CoV-2 by FDA under an Emergency Use Authorization (EUA). This EUA will remain in effect (meaning this test can be used) for the duration of the COVID-19 declaration under Section 564(b)(1) of the Act, 21 U.S.C. section 360bbb-3(b)(1), unless the authorization is terminated or revoked.  Performed at [facility], [address] [place], [place] [address]   Blood Culture (routine x 2)     Status: None (Preliminary result)   Collection Time: 08/30/23  2:02 PM   Specimen: BLOOD  Result Value  Ref Range Status   Specimen Description BLOOD BLOOD RIGHT ARM  Final   Special Requests   Final    BOTTLES DRAWN AEROBIC AND ANAEROBIC Blood Culture adequate volume   Culture   Final    NO GROWTH 4 DAYS Performed at [facility], [address] [place], [place] [address]    Report Status PENDING  Incomplete  Blood Culture (routine x 2)     Status: None (Preliminary result)   Collection Time: 08/30/23  2:02 PM   Specimen: BLOOD  Result Value Ref Range Status   Specimen Description BLOOD BLOOD RIGHT ARM  Final   Special Requests   Final    BOTTLES DRAWN AEROBIC AND ANAEROBIC Blood Culture adequate volume   Culture   Final    NO GROWTH 4 DAYS Performed at [facility], [address], [place], [place] [address]    Report Status PENDING  Incomplete  Respiratory (~20 pathogens) panel by PCR     Status: Abnormal    Collection Time: 08/30/23  3:47 PM   Specimen: Nasopharyngeal Swab; Respiratory  Result Value Ref Range Status   Adenovirus NOT DETECTED NOT DETECTED Final   Coronavirus 229E NOT DETECTED NOT DETECTED Final    Comment: (NOTE) The Coronavirus on the Respiratory Panel, DOES NOT test for the novel  Coronavirus (2019 nCoV)    Coronavirus HKU1 NOT DETECTED NOT DETECTED Final   Coronavirus NL63 NOT DETECTED NOT DETECTED Final   Coronavirus OC43 NOT DETECTED NOT DETECTED Final   Metapneumovirus NOT DETECTED NOT DETECTED Final   Rhinovirus / Enterovirus DETECTED (A) NOT DETECTED Final   Influenza A NOT DETECTED NOT DETECTED Final   Influenza B NOT DETECTED NOT DETECTED Final   Parainfluenza Virus 1 NOT DETECTED NOT DETECTED Final   Parainfluenza Virus 2 NOT DETECTED NOT DETECTED Final   Parainfluenza Virus 3 NOT DETECTED NOT DETECTED Final   Parainfluenza Virus 4 NOT DETECTED NOT DETECTED Final   Respiratory Syncytial Virus NOT DETECTED NOT DETECTED Final   Bordetella pertussis NOT DETECTED NOT DETECTED Final   Bordetella Parapertussis NOT DETECTED NOT DETECTED Final   Chlamydophila pneumoniae NOT DETECTED NOT DETECTED Final   Mycoplasma pneumoniae NOT DETECTED NOT DETECTED Final    Comment: Performed at [facility], [address] [place], [place] [address]  Expectorated Sputum Assessment w Gram Stain, Rflx to Resp Cult     Status: None   Collection Time: 08/30/23  4:25 PM   Specimen: Sputum  Result Value Ref Range Status   Specimen Description SPUTUM  Final   Special Requests NONE  Final   Sputum evaluation   Final    THIS SPECIMEN IS ACCEPTABLE FOR SPUTUM CULTURE Performed at [facility], [address], [place], [place] [address]    Report Status 08/30/2023 FINAL  Final  Culture, Respiratory w Gram Stain     Status: None   Collection Time: 08/30/23  4:25 PM   Specimen: SPU  Result Value Ref Range Status   Specimen Description   Final    SPUTUM Performed at  [facility], [address], [place], [place] [address]    Special Requests   Final    NONE Reflexed from [phone] Performed at [facility], [address] [place], [place] [address]    Gram Stain   Final    RARE SQUAMOUS EPITHELIAL CELLS PRESENT FEW WBC PRESENT,BOTH PMN AND MONONUCLEAR FEW GRAM POSITIVE COCCI IN PAIRS RARE GRAM POSITIVE COCCI IN CLUSTERS  Performed at [facility], [address] [place], [place] [address]    Culture MODERATE STAPHYLOCOCCUS EPIDERMIDIS  Final   Report Status 09/02/2023 FINAL  Final   Organism ID, Bacteria STAPHYLOCOCCUS EPIDERMIDIS  Final      Susceptibility   Staphylococcus epidermidis - MIC*    CIPROFLOXACIN <=0.5 SENSITIVE Sensitive     ERYTHROMYCIN >=8 RESISTANT Resistant     GENTAMICIN 4 SENSITIVE Sensitive     OXACILLIN >=4 RESISTANT Resistant     TETRACYCLINE >=16 RESISTANT Resistant     VANCOMYCIN 1 SENSITIVE Sensitive     TRIMETH/SULFA 80 RESISTANT Resistant     CLINDAMYCIN >=8 RESISTANT Resistant     RIFAMPIN <=0.5 SENSITIVE Sensitive     Inducible Clindamycin NEGATIVE Sensitive     * MODERATE STAPHYLOCOCCUS EPIDERMIDIS  MRSA Next Gen by PCR, Nasal     Status: None   Collection Time: 09/01/23  2:51 AM   Specimen: Nasal Mucosa; Nasal Swab  Result Value Ref Range Status   MRSA by PCR Next Gen NOT DETECTED NOT DETECTED Final    Comment: (NOTE) The GeneXpert MRSA Assay (FDA approved for NASAL specimens only), is one component of a comprehensive MRSA colonization surveillance program. It is not intended to diagnose MRSA infection nor to guide or monitor treatment for MRSA infections. Test performance is not FDA approved in patients less than 89 years old. Performed at [facility] [address] [place], [place] [address]     Coagulation Studies: No results for input(s): "LABPROT", "INR" in the last 72 hours.   Urinalysis: No results for input(s): "COLORURINE", "LABSPEC", "PHURINE",  "GLUCOSEU", "HGBUR", "BILIRUBINUR", "KETONESUR", "PROTEINUR", "UROBILINOGEN", "NITRITE", "LEUKOCYTESUR" in the last 72 hours.  Invalid input(s): "APPERANCEUR"     Imaging: No results found.    Medications:       aspirin EC  81 mg Oral Daily   cyanocobalamin  500 mcg Oral Daily   cyclobenzaprine  10 mg Oral QHS   ezetimibe  10 mg Oral Daily   famotidine  20 mg Oral QHS   gabapentin  100 mg Oral QHS   heparin  5,000 Units Subcutaneous Q8H   insulin aspart  0-15 Units Subcutaneous TID WC   insulin aspart  5 Units Subcutaneous TID WC   insulin aspart protamine- aspart  30 Units Subcutaneous Q breakfast   ipratropium-albuterol  3 mL Nebulization BID   linezolid  600 mg Oral Q12H   predniSONE  40 mg Oral Q breakfast   senna-docusate  1 tablet Oral QHS   sodium chloride flush  3 mL Intravenous Q12H   sodium chloride HYPERTONIC  4 mL Nebulization BID   acetaminophen **OR** acetaminophen, nitroGLYCERIN, ondansetron **OR** ondansetron (ZOFRAN) IV, polyethylene glycol  Assessment/ Plan:  [name] is a 77 y.o.  male with past medical conditions including hypertension, diabetes, hyperlipidemia, gout, CAD status post CABG in 1997, GERD, polycystic kidney disease, and chronic kidney disease stage IV.  Patient presented to the emergency department with shortness of breath and was admitted at that time for Transaminitis [R74.01] AKI (acute kidney injury) (HCC) [N17.9] HCAP (healthcare-associated pneumonia) [J18.9] Sepsis, due to unspecified organism, unspecified whether acute organ dysfunction present [facility]) [A41.9] Acute hypoxic respiratory failure (HCC) [J96.01]   Acute Kidney Injury on chronic kidney disease stage IV with baseline creatinine 2.23 on 04/15/23.  Acute kidney injury secondary to volume overload.  IV contrast exposure on 08/31/2023. Chronic kidney disease is multifactorial: Diabetes and polycystic kidney disease, hypertension, obesity Patient recently  discharged  with creatinine 3.73, currently admitted with initial creatinine of 4.46.   Update: Renal function remains stable today. Decent urine output noted. Will continue to hold diuresis and not offer IVF. Patient was previously discharged at creatinine prior but returned. Would prefer creatinine be lower, likely 3.5 or less before considering discharge.   Lab Results  Component Value Date   CREATININE 3.70 (H) 09/03/2023   CREATININE 3.76 (H) 09/02/2023   CREATININE 3.83 (H) 09/01/2023    Intake/Output Summary (Last 24 hours) at 09/03/2023 1740 Last data filed at 09/03/2023 1638 Gross per 24 hour  Intake 120 ml  Output 975 ml  Net -855 ml    2. Anemia of chronic kidney disease Lab Results  Component Value Date   HGB 9.0 (L) 09/03/2023    Hemoglobin 9.0.  Continue to monitor.   3. Secondary Hyperparathyroidism: with outpatient labs: PTH 73, phosphorus 4.2, calcium 9.7 on 08/18/23.   Lab Results  Component Value Date   CALCIUM 8.8 (L) 09/03/2023   CAION 1.15 09/17/2021   PHOS 3.4 09/12/2016    Continue to monitor bone metabolism parameters.  4. Diabetes mellitus type II with chronic kidney disease/renal manifestations: insulin dependent. Home regimen includes semaglutide, Novolin 70/30 and lispro. Most recent hemoglobin A1c is 6.3 on 05/08/23.    Primary team to manage SSI    LOS: 4 Carrington Mullenax 12/26/20245:40 PM

## 2023-09-03 NOTE — Telephone Encounter (Signed)
Patient Product/process development scientist completed.    The patient is insured through [employer]/ [employer]. Patient has Medicare and is not eligible for a copay card, but may be able to apply for patient assistance, if available.    Ran test claim for Linezolid (Zyvox) 600 mg and the current 9 day co-pay is $0.00.   This test claim was processed through [facility]- copay amounts may vary at other pharmacies due to pharmacy/plan contracts, or as the patient moves through the different stages of their insurance plan.     Roland Earl, CPHT Pharmacy Technician III Certified Patient Advocate [facility] Patient Advocate Team Direct Number: [phone]  Fax: [phone]

## 2023-09-03 NOTE — Progress Notes (Signed)
PROGRESS NOTE    Kurt Aguilar  ZOX:[id number] DOB: 1946-04-25 DOA: 08/30/2023 PCP: Judy Pimple, MD  215A/215A-AA  LOS: 4 days   Brief hospital course:   Assessment & Plan:  Kurt Aguilar is a 77 y.o. 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   Kurt Aguilar was recently admitted from 12/12 to 12/20 for acute hypoxic respiratory failure in the setting of tracheobronchomalacia and suspected LLL pneumonia + AKI on CKD 2/2 contrast exposure.  He was discharged to SNF on 12/20.   * Acute hypoxic respiratory failure (HCC) 2/2 Rhinovirus pneumonia with superimposed possible bacterial pneumonia with MRSE Patient is presenting with AMS morning of presentation in the setting of hypoxia that resolved on placement of supplemental oxygen.  He was recently admitted for pneumonia and received 5 days of meropenem and azithromycin with additional 2 days of Rocephin.  Pulmonology has been consulted and recommending treatment for potential hospital associated pneumonia.  Hypoxia may also be related to underlying NSTEMI/HF given slightly enlarged pleural effusions in addition to ongoing tracheobronchomalacia. --started on vanc and cefepime on presentation, switched to Linezolid to cover MRSE.  --cont Linezolid, will treat for total 14 days --cont prednisone at increased 40 mg daily --Pulmonary hygiene with 3% hypertonic saline neb preceded by Albuterol neb Q8H. Followed by Flutter valve use and IS. Consider cpap 5-15 at bedtime.   Trop elevation 2/2 demand ischemia NSTEMI ruled out --trop peaked at 341.  Echo without wall motion abnormalities.    CAD s/p CABG --cont ASA and ezetimibe - Consider ischemic evaluation as outpatient    Tracheobronchomalacia Chronic steroid Per chart review, patient has a previous history of BOOP with evaluation by pulmonology on last admission noting clinically and radiologically more consistent with tracheobronchomalacia. --has been on chronic prednisone 20 mg daily for many years. - Pulmonology consulted --cont prednisone at increased 40 mg daily.    Chronic heart failure with preserved ejection fraction (HCC) Although patient does not appear significantly hypervolemic on examination, he is noted to have slightly increased pleural effusions on CT imaging and his BNP is increased compared to prior at 336.  Given AKI, he received small IV bolus of fluids.  Will hold off on diuretics at this time and recheck renal function in the evening.  If evidence of worsening renal function, will trial a dose of Lasix, given the possibility this may be cardiorenal in nature. --hold home torsemide, per nephro  Acute kidney injury superimposed on chronic kidney disease 4 (HCC) Patient has a history of CKD stage IV contributed by Diabetes and polycystic kidney disease, hypertension, obesity.  With AKI on most recent admission.  On discharge, his creatinine was noted to be slowly rising as high as 3.73.  On current presentation, 4.46.   --Cr improving without IVF - Nephrology consulted --hold home torsemide, per nephro  Elevated LFTs Unclear etiology at this time.  No prior history of cirrhosis, and INR and platelets are within normal limits.  He is not experiencing any abdominal pain to suggest cholecystitis.  No reported alcohol use.   - Right upper quadrant ultrasound no acute finding - Hold home statin  Type 2 diabetes mellitus (HCC) --resume home 70/30 insulin 30 u daily --mealtime 5u TID - SSI, moderate  Neuropathy - Decrease home gabapentin at bedtime from  300 mg to 100 mg given AKI  Essential hypertension - Hold home antihypertensives in the setting of high risk for  hypotension.  Obstructive sleep apnea - CPAP nightly   DVT prophylaxis: Heparin SQ Code Status: DNR  Family Communication:  Level of care: Med-Surg Dispo:   The patient is from: SNF rehab Anticipated d/c is to: SNF rehab Anticipated d/c date is: 1-2 days   Subjective and Interval History:  Pt reported still not able to sleep.  Leg cramp improved.   Objective: Vitals:   09/03/23 0127 09/03/23 0453 09/03/23 0832 09/03/23 1637  BP: 127/89 132/83 120/82 (!) 142/89  Pulse: 83 85 91 95  Resp: 20 20 16 16   Temp: 97.7 F (36.5 C) 97.6 F (36.4 C) 97.8 F (36.6 C) 98.1 F (36.7 C)  TempSrc: Oral Oral    SpO2: 94% 97% 93% 92%  Weight:      Height:        Intake/Output Summary (Last 24 hours) at 09/03/2023 1917 Last data filed at 09/03/2023 1638 Gross per 24 hour  Intake 120 ml  Output 975 ml  Net -855 ml   Filed Weights   09/01/23 0229  Weight: 108.5 kg    Examination:   Constitutional: NAD, AAOx3 HEENT: conjunctivae and lids normal, EOMI CV: No cyanosis.   RESP: normal respiratory effort, on [place] Neuro: II - XII grossly intact.   Psych: Normal mood and affect.  Appropriate judgement and reason   Data Reviewed: I have personally reviewed labs and imaging studies  Time spent: 35 minutes  Darlin Priestly, MD [facility] If 7PM-7AM, please contact night-coverage 09/03/2023, 7:17 PM

## 2023-09-03 NOTE — Plan of Care (Signed)

## 2023-09-04 DIAGNOSIS — J9601 Acute respiratory failure with hypoxia: Secondary | ICD-10-CM | POA: Diagnosis not present

## 2023-09-04 LAB — CBC
HCT: 26.6 % — ABNORMAL LOW (ref 39.0–52.0)
Hemoglobin: 8.6 g/dL — ABNORMAL LOW (ref 13.0–17.0)
MCH: 32.3 pg (ref 26.0–34.0)
MCHC: 32.3 g/dL (ref 30.0–36.0)
MCV: 100 fL (ref 80.0–100.0)
Platelets: 239 10*3/uL (ref 150–400)
RBC: 2.66 MIL/uL — ABNORMAL LOW (ref 4.22–5.81)
RDW: 15 % (ref 11.5–15.5)
WBC: 11.8 10*3/uL — ABNORMAL HIGH (ref 4.0–10.5)
nRBC: 0.8 % — ABNORMAL HIGH (ref 0.0–0.2)

## 2023-09-04 LAB — ANCA PROFILE
Anti-MPO Antibodies: <0.2 U (ref 0.0–0.9)
Anti-PR3 Antibodies: <0.2 U (ref 0.0–0.9)
Atypical P-ANCA titer: <1:20 {titer}
C-ANCA: <1:20 {titer}
P-ANCA: <1:20 {titer}

## 2023-09-04 LAB — CULTURE, BLOOD (ROUTINE X 2)
Culture: NO GROWTH
Culture: NO GROWTH
Special Requests: ADEQUATE
Special Requests: ADEQUATE

## 2023-09-04 LAB — BASIC METABOLIC PANEL
Anion gap: 15 (ref 5–15)
BUN: 85 mg/dL — ABNORMAL HIGH (ref 8–23)
CO2: 22 mmol/L (ref 22–32)
Calcium: 8.9 mg/dL (ref 8.9–10.3)
Chloride: 102 mmol/L (ref 98–111)
Creatinine, Ser: 3.45 mg/dL — ABNORMAL HIGH (ref 0.61–1.24)
GFR, Estimated: 17 mL/min — ABNORMAL LOW (ref >60)
Glucose, Bld: 199 mg/dL — ABNORMAL HIGH (ref 70–99)
Potassium: 4.2 mmol/L (ref 3.5–5.1)
Sodium: 139 mmol/L (ref 135–145)

## 2023-09-04 LAB — GLUCOSE, CAPILLARY
Glucose-Capillary: 177 mg/dL — ABNORMAL HIGH (ref 70–99)
Glucose-Capillary: 125 mg/dL — ABNORMAL HIGH (ref 70–99)
Glucose-Capillary: 150 mg/dL — ABNORMAL HIGH (ref 70–99)
Glucose-Capillary: 282 mg/dL — ABNORMAL HIGH (ref 70–99)

## 2023-09-04 MED ORDER — METHOCARBAMOL 1000 MG/10ML IJ SOLN
500.0000 mg | Freq: Once | INTRAMUSCULAR | Status: AC
  Administered 2023-09-04: 500 mg via INTRAVENOUS
  Filled 2023-09-04: qty 5

## 2023-09-04 MED ORDER — SODIUM CHLORIDE 3 % IN NEBU
4.0000 mL | INHALATION_SOLUTION | Freq: Two times a day (BID) | RESPIRATORY_TRACT | Status: DC
  Administered 2023-09-05 – 2023-09-06 (×3): 4 mL via RESPIRATORY_TRACT
  Filled 2023-09-04 (×4): qty 4

## 2023-09-04 NOTE — Progress Notes (Signed)
[facility]  ROUNDING NOTE   Subjective:   EDVARDO Aguilar is a 77 year old male with past medical conditions including hypertension, diabetes, hyperlipidemia, gout, CAD status post CABG in 1997, GERD, polycystic kidney disease, and chronic kidney disease stage IV.  Patient presented to the emergency department with shortness of breath and was admitted at that time for Transaminitis [R74.01] AKI (acute kidney injury) (HCC) [N17.9] HCAP (healthcare-associated pneumonia) [J18.9] Sepsis, due to unspecified organism, unspecified whether acute organ dysfunction present (HCC) [A41.9] Acute hypoxic respiratory failure (HCC) [J96.01]  Patient is known to our practice and is followed outpatient by Dr. Thedore Mins.  Patient recently admitted for pneumonia.   Patient seen resting in bed Alert and oriented Wife at bedside Remains on 2L [place]  Appears patient experienced epistaxis overnight  Creatinine 3.45 UOP 1L  Objective:  Vital signs in last 24 hours:  Temp:  [97.7 F (36.5 C)-98.4 F (36.9 C)] 98.4 F (36.9 C) (12/27 0814) Pulse Rate:  [87-95] 87 (12/27 0814) Resp:  [16-20] 18 (12/27 0814) BP: (124-149)/(80-92) 124/80 (12/27 0814) SpO2:  [88 %-99 %] 99 % (12/27 0814)  Weight change:  Filed Weights   09/01/23 0229  Weight: 108.5 kg     Intake/Output: I/O last 3 completed shifts: In: 120 [P.O.:120] Out: 1500 [Urine:1500]   Intake/Output this shift:  No intake/output data recorded.  Physical Exam: General: NAD  Head: Normocephalic, atraumatic. Moist oral mucosal membranes  Eyes: Anicteric  Lungs:  Basilar crackles, normal effort, NCO 2  Heart: Regular rate and rhythm  Abdomen:  Soft, nontender, obese  Extremities: 1+ peripheral edema.  Neurologic: Alert and oriented, moving all four extremities  Skin: No lesions       Basic Metabolic Panel: Recent Labs  Lab 08/31/23 0144 09/01/23 0537 09/02/23 0435 09/02/23 1614 09/03/23 0516 09/04/23 0456  NA 140  137 139  --  140 139  K 4.3 3.8 4.0  --  3.8 4.2  CL 97* 97* 100  --  101 102  CO2 28 24 25   --  25 22  GLUCOSE 293* 225* 218* 428* 156* 199*  BUN 88* 86* 92*  --  88* 85*  CREATININE 4.29* 3.83* 3.76*  --  3.70* 3.45*  CALCIUM 8.8* 8.4* 8.5*  --  8.8* 8.9  MG  --  2.2 2.3  --   --   --     Liver Function Tests: Recent Labs  Lab 08/30/23 1328 08/31/23 0144  AST 257* 140*  ALT 403* 284*  ALKPHOS 184* 145*  BILITOT 0.8 0.8  PROT 7.1 6.1*  ALBUMIN 3.0* 2.6*    No results for input(s): "LIPASE", "AMYLASE" in the last 168 hours. No results for input(s): "AMMONIA" in the last 168 hours.  CBC: Recent Labs  Lab 08/30/23 1328 08/31/23 0144 09/01/23 0537 09/02/23 0435 09/03/23 0516 09/04/23 0456  WBC 13.5* 12.7* 14.0* 13.7* 15.2* 11.8*  NEUTROABS 10.8* 10.2*  --   --   --   --   HGB 10.8* 9.8* 9.6* 8.7* 9.0* 8.6*  HCT 34.4* 30.3* 29.4* 26.8* 27.9* 26.6*  MCV 102.7* 100.3* 99.3 100.0 101.5* 100.0  PLT 331 274 266 239 247 239    Cardiac Enzymes: No results for input(s): "CKTOTAL", "CKMB", "CKMBINDEX", "TROPONINI" in the last 168 hours.  BNP: Invalid input(s): "POCBNP"  CBG: Recent Labs  Lab 09/03/23 1144 09/03/23 1635 09/03/23 1952 09/04/23 0848 09/04/23 1232  GLUCAP 149* 168* 220* 177* 125*    Microbiology: Results for orders placed or performed during  the hospital encounter of 08/30/23  Resp panel by RT-PCR (RSV, Flu A&B, Covid) Anterior Nasal Swab     Status: None   Collection Time: 08/30/23  1:28 PM   Specimen: Anterior Nasal Swab  Result Value Ref Range Status   SARS Coronavirus 2 by RT PCR NEGATIVE NEGATIVE Final    Comment: (NOTE) SARS-CoV-2 target nucleic acids are NOT DETECTED.  The SARS-CoV-2 RNA is generally detectable in upper respiratory specimens during the acute phase of infection. The lowest concentration of SARS-CoV-2 viral copies this assay can detect is 138 copies/mL. A negative result does not preclude SARS-Cov-2 infection and should  not be used as the sole basis for treatment or other patient management decisions. A negative result may occur with  improper specimen collection/handling, submission of specimen other than nasopharyngeal swab, presence of viral mutation(s) within the areas targeted by this assay, and inadequate number of viral copies(<138 copies/mL). A negative result must be combined with clinical observations, patient history, and epidemiological information. The expected result is Negative.  Fact Sheet for Patients:  BloggerCourse.com  Fact Sheet for Healthcare Providers:  SeriousBroker.it  This test is no t yet approved or cleared by the [place] FDA and  has been authorized for detection and/or diagnosis of SARS-CoV-2 by FDA under an Emergency Use Authorization (EUA). This EUA will remain  in effect (meaning this test can be used) for the duration of the COVID-19 declaration under Section 564(b)(1) of the Act, 21 U.S.C.section 360bbb-3(b)(1), unless the authorization is terminated  or revoked sooner.       Influenza A by PCR NEGATIVE NEGATIVE Final   Influenza B by PCR NEGATIVE NEGATIVE Final    Comment: (NOTE) The Xpert Xpress SARS-CoV-2/FLU/RSV plus assay is intended as an aid in the diagnosis of influenza from Nasopharyngeal swab specimens and should not be used as a sole basis for treatment. Nasal washings and aspirates are unacceptable for Xpert Xpress SARS-CoV-2/FLU/RSV testing.  Fact Sheet for Patients: BloggerCourse.com  Fact Sheet for Healthcare Providers: SeriousBroker.it  This test is not yet approved or cleared by the [place] FDA and has been authorized for detection and/or diagnosis of SARS-CoV-2 by FDA under an Emergency Use Authorization (EUA). This EUA will remain in effect (meaning this test can be used) for the duration of the COVID-19 declaration under  Section 564(b)(1) of the Act, 21 U.S.C. section 360bbb-3(b)(1), unless the authorization is terminated or revoked.     Resp Syncytial Virus by PCR NEGATIVE NEGATIVE Final    Comment: (NOTE) Fact Sheet for Patients: BloggerCourse.com  Fact Sheet for Healthcare Providers: SeriousBroker.it  This test is not yet approved or cleared by the [place] FDA and has been authorized for detection and/or diagnosis of SARS-CoV-2 by FDA under an Emergency Use Authorization (EUA). This EUA will remain in effect (meaning this test can be used) for the duration of the COVID-19 declaration under Section 564(b)(1) of the Act, 21 U.S.C. section 360bbb-3(b)(1), unless the authorization is terminated or revoked.  Performed at [facility], [address] [place], [place] [address]   Blood Culture (routine x 2)     Status: None   Collection Time: 08/30/23  2:02 PM   Specimen: BLOOD  Result Value Ref Range Status   Specimen Description BLOOD BLOOD RIGHT ARM  Final   Special Requests   Final    BOTTLES DRAWN AEROBIC AND ANAEROBIC Blood Culture adequate volume   Culture   Final    NO GROWTH 5 DAYS Performed at [facility], [address] [place], [place] [address]    Report Status 09/04/2023 FINAL  Final  Blood Culture (routine x 2)     Status: None   Collection Time: 08/30/23  2:02 PM   Specimen: BLOOD  Result Value Ref Range Status   Specimen Description BLOOD BLOOD RIGHT ARM  Final   Special Requests   Final    BOTTLES DRAWN AEROBIC AND ANAEROBIC Blood Culture adequate volume   Culture   Final    NO GROWTH 5 DAYS Performed at [facility], [address] [place], [place] [address]    Report Status 09/04/2023 FINAL  Final  Respiratory (~20 pathogens) panel by PCR     Status: Abnormal   Collection Time: 08/30/23  3:47 PM   Specimen: Nasopharyngeal Swab; Respiratory  Result Value Ref Range Status    Adenovirus NOT DETECTED NOT DETECTED Final   Coronavirus 229E NOT DETECTED NOT DETECTED Final    Comment: (NOTE) The Coronavirus on the Respiratory Panel, DOES NOT test for the novel  Coronavirus (2019 nCoV)    Coronavirus HKU1 NOT DETECTED NOT DETECTED Final   Coronavirus NL63 NOT DETECTED NOT DETECTED Final   Coronavirus OC43 NOT DETECTED NOT DETECTED Final   Metapneumovirus NOT DETECTED NOT DETECTED Final   Rhinovirus / Enterovirus DETECTED (A) NOT DETECTED Final   Influenza A NOT DETECTED NOT DETECTED Final   Influenza B NOT DETECTED NOT DETECTED Final   Parainfluenza Virus 1 NOT DETECTED NOT DETECTED Final   Parainfluenza Virus 2 NOT DETECTED NOT DETECTED Final   Parainfluenza Virus 3 NOT DETECTED NOT DETECTED Final   Parainfluenza Virus 4 NOT DETECTED NOT DETECTED Final   Respiratory Syncytial Virus NOT DETECTED NOT DETECTED Final   Bordetella pertussis NOT DETECTED NOT DETECTED Final   Bordetella Parapertussis NOT DETECTED NOT DETECTED Final   Chlamydophila pneumoniae NOT DETECTED NOT DETECTED Final   Mycoplasma pneumoniae NOT DETECTED NOT DETECTED Final    Comment: Performed at [facility], [address] [place], [place] [address]  Expectorated Sputum Assessment w Gram Stain, Rflx to Resp Cult     Status: None   Collection Time: 08/30/23  4:25 PM   Specimen: Sputum  Result Value Ref Range Status   Specimen Description SPUTUM  Final   Special Requests NONE  Final   Sputum evaluation   Final    THIS SPECIMEN IS ACCEPTABLE FOR SPUTUM CULTURE Performed at [facility], [address], [place], [place] [address]    Report Status 08/30/2023 FINAL  Final  Culture, Respiratory w Gram Stain     Status: None   Collection Time: 08/30/23  4:25 PM   Specimen: SPU  Result Value Ref Range Status   Specimen Description   Final    SPUTUM Performed at [facility], [address] [place], [place] [address]    Special Requests   Final    NONE Reflexed  from [phone] Performed at [facility], [address] [place], [place] [address]    Gram Stain   Final    RARE SQUAMOUS EPITHELIAL CELLS PRESENT FEW WBC PRESENT,BOTH PMN AND MONONUCLEAR FEW GRAM POSITIVE COCCI IN PAIRS RARE GRAM POSITIVE COCCI IN CLUSTERS Performed at [facility], [address] [place], [place] [address]    Culture MODERATE STAPHYLOCOCCUS EPIDERMIDIS  Final   Report Status 09/02/2023 FINAL  Final   Organism ID, Bacteria STAPHYLOCOCCUS EPIDERMIDIS  Final      Susceptibility   Staphylococcus  epidermidis - MIC*    CIPROFLOXACIN <=0.5 SENSITIVE Sensitive     ERYTHROMYCIN >=8 RESISTANT Resistant     GENTAMICIN 4 SENSITIVE Sensitive     OXACILLIN >=4 RESISTANT Resistant     TETRACYCLINE >=16 RESISTANT Resistant     VANCOMYCIN 1 SENSITIVE Sensitive     TRIMETH/SULFA 80 RESISTANT Resistant     CLINDAMYCIN >=8 RESISTANT Resistant     RIFAMPIN <=0.5 SENSITIVE Sensitive     Inducible Clindamycin NEGATIVE Sensitive     * MODERATE STAPHYLOCOCCUS EPIDERMIDIS  MRSA Next Gen by PCR, Nasal     Status: None   Collection Time: 09/01/23  2:51 AM   Specimen: Nasal Mucosa; Nasal Swab  Result Value Ref Range Status   MRSA by PCR Next Gen NOT DETECTED NOT DETECTED Final    Comment: (NOTE) The GeneXpert MRSA Assay (FDA approved for NASAL specimens only), is one component of a comprehensive MRSA colonization surveillance program. It is not intended to diagnose MRSA infection nor to guide or monitor treatment for MRSA infections. Test performance is not FDA approved in patients less than 58 years old. Performed at [facility], [address] [place], [place] [address]     Coagulation Studies: No results for input(s): "LABPROT", "INR" in the last 72 hours.   Urinalysis: No results for input(s): "COLORURINE", "LABSPEC", "PHURINE", "GLUCOSEU", "HGBUR", "BILIRUBINUR", "KETONESUR", "PROTEINUR", "UROBILINOGEN", "NITRITE", "LEUKOCYTESUR" in the last 72  hours.  Invalid input(s): "APPERANCEUR"     Imaging: No results found.    Medications:       aspirin EC  81 mg Oral Daily   cyanocobalamin  500 mcg Oral Daily   cyclobenzaprine  10 mg Oral QHS   ezetimibe  10 mg Oral Daily   famotidine  20 mg Oral QHS   gabapentin  100 mg Oral QHS   heparin  5,000 Units Subcutaneous Q8H   insulin aspart  0-15 Units Subcutaneous TID WC   insulin aspart  5 Units Subcutaneous TID WC   insulin aspart protamine- aspart  30 Units Subcutaneous Q breakfast   ipratropium-albuterol  3 mL Nebulization BID   linezolid  600 mg Oral Q12H   predniSONE  40 mg Oral Q breakfast   QUEtiapine  100 mg Oral QHS   senna-docusate  1 tablet Oral QHS   sodium chloride flush  3 mL Intravenous Q12H   sodium chloride HYPERTONIC  4 mL Nebulization BID   acetaminophen **OR** acetaminophen, nitroGLYCERIN, ondansetron **OR** ondansetron (ZOFRAN) IV, polyethylene glycol  Assessment/ Plan:  Mr. SHUBHAM KNIFFIN is a 77 y.o.  male with past medical conditions including hypertension, diabetes, hyperlipidemia, gout, CAD status post CABG in 1997, GERD, polycystic kidney disease, and chronic kidney disease stage IV.  Patient presented to the emergency department with shortness of breath and was admitted at that time for Transaminitis [R74.01] AKI (acute kidney injury) (HCC) [N17.9] HCAP (healthcare-associated pneumonia) [J18.9] Sepsis, due to unspecified organism, unspecified whether acute organ dysfunction present [facility]) [A41.9] Acute hypoxic respiratory failure (HCC) [J96.01]   Acute Kidney Injury on chronic kidney disease stage IV with baseline creatinine 2.23 on 04/15/23.  Acute kidney injury secondary to volume overload.  IV contrast exposure on 08/31/2023. Chronic kidney disease is multifactorial: Diabetes and polycystic kidney disease, hypertension, obesity Patient recently discharged with creatinine 3.73, currently admitted with initial creatinine of 4.46.    Update:  Creatinine improved to 3.45 with decent urine output. Patient cleared to discharge from renal stance. Would recommend starting Torsemide 20mg  daily tomorrow. Will schedule  follow up appt in our office in 1-2 weeks   Lab Results  Component Value Date   CREATININE 3.45 (H) 09/04/2023   CREATININE 3.70 (H) 09/03/2023   CREATININE 3.76 (H) 09/02/2023    Intake/Output Summary (Last 24 hours) at 09/04/2023 1256 Last data filed at 09/03/2023 2147 Gross per 24 hour  Intake --  Output 775 ml  Net -775 ml    2. Anemia of chronic kidney disease Lab Results  Component Value Date   HGB 8.6 (L) 09/04/2023    Hemoglobin 8.6. Patient experienced epistaxis overnight. Prescribed Afrin by overnight coverage.   3. Secondary Hyperparathyroidism: with outpatient labs: PTH 73, phosphorus 4.2, calcium 9.7 on 08/18/23.   Lab Results  Component Value Date   CALCIUM 8.9 09/04/2023   CAION 1.15 09/17/2021   PHOS 3.4 09/12/2016    Bone minerals within desired range  4. Diabetes mellitus type II with chronic kidney disease/renal manifestations: insulin dependent. Home regimen includes semaglutide, Novolin 70/30 and lispro. Most recent hemoglobin A1c is 6.3 on 05/08/23.   Glucose well controlled. Primary team to manage SSI    LOS: 5 Lameisha Schuenemann 12/27/202412:56 PM

## 2023-09-04 NOTE — Evaluation (Signed)
Physical Therapy Evaluation Patient Details Name: Kurt Aguilar MRN: [id number] DOB: Jun 26, 1946 Today's Date: 09/04/2023  History of Present Illness  arry RISHABH MCMURDIE is a 77 y.o. male with medical history significant of tracheobronchomalacia (previously diagnosed as BOOP) on chronic steroid therapy, CAD s/p CABG (1997), CKD stage IV, hypertension, hyperlipidemia, type 2 diabetes, OSA on CPAP, osteoporosis, who presents to the ED due to shortness of breath   Clinical Impression  Patient received in bed, he is agreeable to PT assessment. Patient's wife present in room. He requires mod A for supine to sit and min A for sit to stand. Patient is quite weak and required min A +2 for safety when moving from bed to recliner. He attempted to sit prematurely and reports weakness, faint feeling once in recliner. Patient's legs were elevated and he was reclined with improved his symptoms. HR and O2 were WNL. RN made aware and said he will check orthostatics. Patient will continue to benefit from skilled PT to improve strength, endurance and safety.          If plan is discharge home, recommend the following: A lot of help with walking and/or transfers;A lot of help with bathing/dressing/bathroom;Assist for transportation   Can travel by private vehicle   No    Equipment Recommendations Other (comment) (TBD)  Recommendations for Other Services       Functional Status Assessment Patient has had a recent decline in their functional status and demonstrates the ability to make significant improvements in function in a reasonable and predictable amount of time.     Precautions / Restrictions Precautions Precautions: Fall Precaution Comments: AF c RVR, chronic low back pain Restrictions Weight Bearing Restrictions Per Provider Order: No      Mobility  Bed Mobility Overal bed mobility: Needs Assistance Bed Mobility: Supine to Sit     Supine to sit: Mod assist, HOB elevated     General bed  mobility comments: patient requires assist to perform supine to sit.    Transfers Overall transfer level: Needs assistance Equipment used: Rolling walker (2 wheels) Transfers: Sit to/from Stand Sit to Stand: Min assist                Ambulation/Gait Ambulation/Gait assistance: Min assist, +2 safety/equipment, +2 physical assistance Gait Distance (Feet): 4 Feet Assistive device: Rolling walker (2 wheels) Gait Pattern/deviations: Step-to pattern, Decreased step length - right, Decreased step length - left, Decreased stride length Gait velocity: decr     General Gait Details: patient generally shaky with mobility. O2 sats remained > 90% throughout. HR in 110s. He reports feeling faint and weak with mobility. Attempted to sit prematurely.  Stairs            Wheelchair Mobility     Tilt Bed    Modified Rankin (Stroke Patients Only)       Balance Overall balance assessment: Needs assistance Sitting-balance support: Feet supported Sitting balance-Leahy Scale: Good     Standing balance support: Bilateral upper extremity supported, During functional activity, Reliant on assistive device for balance Standing balance-Leahy Scale: Fair Standing balance comment: rolling walker required for external support with min A +1-2 for safety                             Pertinent Vitals/Pain Pain Assessment Pain Assessment: No/denies pain    Home Living Family/patient expects to be discharged to:: Private residence Living Arrangements: Spouse/significant other Available Help at Discharge: Family  Type of Home: Independent living facility Home Access: Level entry       Home Layout: One level Home Equipment: Rollator (4 wheels);Cane - single point;Shower seat      Prior Function               Mobility Comments: cane for household ambulation, 4 wheeled walker for community ADLs Comments: Mod I     Extremity/Trunk Assessment   Upper Extremity  Assessment Upper Extremity Assessment: Generalized weakness    Lower Extremity Assessment Lower Extremity Assessment: Generalized weakness    Cervical / Trunk Assessment Cervical / Trunk Assessment: Normal  Communication   Communication Communication: No apparent difficulties Cueing Techniques: Verbal cues  Cognition Arousal: Alert Behavior During Therapy: WFL for tasks assessed/performed Overall Cognitive Status: Within Functional Limits for tasks assessed                                          General Comments      Exercises     Assessment/Plan    PT Assessment Patient needs continued PT services  PT Problem List Decreased strength;Decreased activity tolerance;Decreased balance;Decreased mobility;Cardiopulmonary status limiting activity;Decreased safety awareness       PT Treatment Interventions DME instruction;Gait training;Stair training;Functional mobility training;Therapeutic exercise;Therapeutic activities;Balance training;Neuromuscular re-education;Cognitive remediation;Patient/family education    PT Goals (Current goals can be found in the Care Plan section)  Acute Rehab PT Goals Patient Stated Goal: to return to SNF PT Goal Formulation: With patient/family Time For Goal Achievement: 09/17/23 Potential to Achieve Goals: Good    Frequency Min 1X/week     Co-evaluation PT/OT/SLP Co-Evaluation/Treatment: Yes Reason for Co-Treatment: For patient/therapist safety;To address functional/ADL transfers PT goals addressed during session: Mobility/safety with mobility;Balance;Proper use of DME         AM-PAC PT "6 Clicks" Mobility  Outcome Measure Help needed turning from your back to your side while in a flat bed without using bedrails?: A Lot Help needed moving from lying on your back to sitting on the side of a flat bed without using bedrails?: A Lot Help needed moving to and from a bed to a chair (including a wheelchair)?: A Lot Help  needed standing up from a chair using your arms (e.g., wheelchair or bedside chair)?: A Lot Help needed to walk in hospital room?: A Lot Help needed climbing 3-5 steps with a railing? : Total 6 Click Score: 11    End of Session Equipment Utilized During Treatment: Oxygen Activity Tolerance: Patient limited by fatigue Patient left: in chair;with call bell/phone within reach;with family/visitor present Nurse Communication: Mobility status PT Visit Diagnosis: Other abnormalities of gait and mobility (R26.89);Muscle weakness (generalized) (M62.81);Unsteadiness on feet (R26.81);Difficulty in walking, not elsewhere classified (R26.2)    Time: 1610-9604 PT Time Calculation (min) (ACUTE ONLY): 18 min   Charges:   PT Evaluation $PT Eval Moderate Complexity: 1 Mod   PT General Charges $$ ACUTE PT VISIT: 1 Visit         Zaeden Lastinger, PT, GCS 09/04/23,2:04 PM

## 2023-09-04 NOTE — Evaluation (Signed)
Occupational Therapy Evaluation Patient Details Name: Kurt Aguilar MRN: [id number] DOB: [birth year] Today's Date: 09/04/2023   History of Present Illness 77 y.o. male with medical history significant of tracheobronchomalacia (previously diagnosed as BOOP) on chronic steroid therapy, CAD s/p CABG (1997), CKD stage IV, hypertension, hyperlipidemia, type 2 diabetes, OSA on CPAP, osteoporosis, who presents to the ED due to shortness of breath   Clinical Impression   Pt was seen for PT/OT co-evaluation this date. Prior to hospital admission, pt lived at [facility] with his wife. He was MOD I with ADLs/ambulation using a cane in the home and rollator in the community.   Pt presents to acute OT demonstrating impaired ADL performance and functional mobility 2/2 decreased activity tolerance, weakness, and balance deficits (See OT problem list for additional functional deficits). Pt currently requires Mod A for bed mobility for trunk management. Min A x2 needed for STS from EOB and for safety with SPT to recliner with sp02 monitored throughout and maintaining in 90's on 2L. Pt notably SOB requiring education on PLB. He did become dizzy during transfer and was shakey/buckling. Legs elevated and pt back to feeling normal prior to PT/OT exit. No pain during session. Nursing notified of symptoms during transfer and will monitor orthostatic vitals with return to bed. Pt would benefit from skilled OT services to address noted impairments and functional limitations (see below for any additional details) in order to maximize safety and independence while minimizing falls risk and caregiver burden. Do anticipate the need for follow up OT services upon acute hospital DC.        If plan is discharge home, recommend the following: A little help with walking and/or transfers;A lot of help with bathing/dressing/bathroom;Assistance with cooking/housework;Assist for transportation;Help with stairs or ramp for  entrance    Functional Status Assessment  Patient has had a recent decline in their functional status and demonstrates the ability to make significant improvements in function in a reasonable and predictable amount of time.  Equipment Recommendations  Other (comment) (defer)    Recommendations for Other Services       Precautions / Restrictions Precautions Precautions: Fall Precaution Comments: AF c RVR, chronic low back pain Restrictions Weight Bearing Restrictions Per Provider Order: No      Mobility Bed Mobility Overal bed mobility: Needs Assistance Bed Mobility: Supine to Sit     Supine to sit: Mod assist, HOB elevated Sit to supine: Min assist   General bed mobility comments: needed for trunk management to reach upright sitting    Transfers Overall transfer level: Needs assistance Equipment used: Rolling walker (2 wheels) Transfers: Sit to/from Stand Sit to Stand: Min assist     Step pivot transfers: Min assist     General transfer comment: Min A for STS and SPT to recliner      Balance Overall balance assessment: Needs assistance Sitting-balance support: Feet supported Sitting balance-Leahy Scale: Good Sitting balance - Comments: no LOB while seated EOB   Standing balance support: Bilateral upper extremity supported, During functional activity, Reliant on assistive device for balance Standing balance-Leahy Scale: Poor Standing balance comment: RW use for external support with min A +1-2 for safety with some knee buckling                           ADL either performed or assessed with clinical judgement   ADL Overall ADL's : Needs assistance/impaired  Toilet Transfer: Rolling walker (2 wheels);Minimal assistance Toilet Transfer Details (indicate cue type and reason): simulated to recliner                 Vision         Perception         Praxis         Pertinent Vitals/Pain Pain  Assessment Pain Assessment: No/denies pain Pain Intervention(s): Monitored during session     Extremity/Trunk Assessment Upper Extremity Assessment Upper Extremity Assessment: Generalized weakness   Lower Extremity Assessment Lower Extremity Assessment: Generalized weakness   Cervical / Trunk Assessment Cervical / Trunk Assessment: Normal   Communication Communication Communication: No apparent difficulties Cueing Techniques: Verbal cues   Cognition Arousal: Alert Behavior During Therapy: WFL for tasks assessed/performed Overall Cognitive Status: Within Functional Limits for tasks assessed                                       General Comments  02 on 2L maintained WFL throughout; HR in low 100's; became dizzy during transfer and nurse notified to check orthostatics with return to bed    Exercises Other Exercises Other Exercises: Edu in role of acute OT services and importance of therapy to maximize strength/IND/safety.   Shoulder Instructions      Home Living Family/patient expects to be discharged to:: Private residence Living Arrangements: Spouse/significant other Available Help at Discharge: Family Type of Home: Independent living facility Home Access: Level entry     Home Layout: One level         Bathroom Toilet: Handicapped height     Home Equipment: Rollator (4 wheels);Cane - single point;Shower seat          Prior Functioning/Environment Prior Level of Function : Independent/Modified Independent             Mobility Comments: cane for household ambulation, 4 wheeled walker for community ADLs Comments: Mod I        OT Problem List: Decreased strength;Decreased activity tolerance;Impaired balance (sitting and/or standing)      OT Treatment/Interventions: Self-care/ADL training;Therapeutic exercise;Therapeutic activities;Energy conservation;Patient/family education;DME and/or AE instruction;Balance training    OT  Goals(Current goals can be found in the care plan section) Acute Rehab OT Goals Patient Stated Goal: improve his strength and endurance OT Goal Formulation: With patient/family Time For Goal Achievement: 09/18/23 Potential to Achieve Goals: Good ADL Goals Pt Will Perform Lower Body Bathing: with contact guard assist;sitting/lateral leans;sit to/from stand;with adaptive equipment Pt Will Transfer to Toilet: with supervision;bedside commode;ambulating Pt Will Perform Toileting - Clothing Manipulation and hygiene: with contact guard assist;sit to/from stand;sitting/lateral leans Additional ADL Goal #1: Pt will demo/verbalize 1 learned ECS, e.g. PLB, use of AE/AD, pacing, etc. to maximize ADL performance and prevent overexertion.  OT Frequency: Min 1X/week    Co-evaluation PT/OT/SLP Co-Evaluation/Treatment: Yes Reason for Co-Treatment: For patient/therapist safety;To address functional/ADL transfers PT goals addressed during session: Mobility/safety with mobility;Balance;Proper use of DME        AM-PAC OT "6 Clicks" Daily Activity     Outcome Measure Help from another person eating meals?: None Help from another person taking care of personal grooming?: None Help from another person toileting, which includes using toliet, bedpan, or urinal?: A Lot Help from another person bathing (including washing, rinsing, drying)?: A Lot Help from another person to put on and taking off regular upper body clothing?: A Little Help from another  person to put on and taking off regular lower body clothing?: A Lot 6 Click Score: 17   End of Session Equipment Utilized During Treatment: Rolling walker (2 wheels);Oxygen Nurse Communication: Mobility status  Activity Tolerance: Patient tolerated treatment well Patient left: in chair;with call bell/phone within reach;with chair alarm set;with family/visitor present  OT Visit Diagnosis: Other abnormalities of gait and mobility (R26.89);Muscle weakness  (generalized) (M62.81)                Time: 1300-1320 OT Time Calculation (min): 20 min Charges:  OT General Charges $OT Visit: 1 Visit OT Evaluation $OT Eval Moderate Complexity: 1 Mod Shaquisha Wynn, OTR/L  09/04/23, 2:58 PM Dayla Gasca E Maryanna Stuber 09/04/2023, 2:53 PM

## 2023-09-04 NOTE — Progress Notes (Signed)
NAME:  Kurt Aguilar, MRN:  [id number], DOB:  [birth year], LOS: 5 ADMISSION DATE:  08/30/2023  History of Present Illness:  Kurt Aguilar is a 77 year old male patient with a past medical history of CAD status post CABG in 1997, moderate mitral valve stenosis, severe tracheobronchomalacia, CKD stage IV, diabetes mellitus type 2, hypertension, hyperlipidemia and OSA on CPAP who is presenting to [facility] on 12/22 with acute hypoxic respiratory failure.  He was recently admitted to [facility] few days ago with acute hypoxic respiratory failure.  Symptoms started few days before his initial presentation with fever up to 101 after coming back from a cruise.  He was treated for community-acquired pneumonia with meropenem vancomycin and azithromycin.  He was discharged home on oxygen.  He comes back now with ongoing hypoxic respiratory failure worsening cough with sputum production.  CT chest with worsening multilobar pneumonia specifically at the right lung base in the posterior left lower lobe.  Respiratory viral panel 12/22 positive for rhinovirus.  Sputum culture 12/22 with few gram-positive cocci in pairs and clusters.  Aspergillus antigen 12/14 negative.  Beta D glucan pending, ANA Negative, ANCA Negative, RF negative   He is also being managed and evaluated for NSTEMI.  Also found with worsening kidney function from a baseline of 2.2 mg/dL now with creatinine of 4.3 mg/dL.  Echocardiogram 12/23 - Normal EF 60%, no rwma, RV is normal, PASP normal, no significant mitral valve disease.   Pertinent  Medical History  As above.  Interim History / Subjective:  Sitting in chair eating, good appetite. Still complaining of significant dyspnea. Barely was able to stand up with PT. WBC downtrending, cough improved.   Objective   Blood pressure 124/80, pulse 87, temperature 98.4 F (36.9 C), resp. rate 18, height 5\' 7"  (1.702 m), weight 108.5 kg, SpO2  96%.        Intake/Output Summary (Last 24 hours) at 09/04/2023 1827 Last data filed at 09/03/2023 2147 Gross per 24 hour  Intake --  Output 525 ml  Net -525 ml   Filed Weights   09/01/23 0229  Weight: 108.5 kg    GEN no acute distress HEENT supple neck, reactive pupils Pulmonary faint wheezing heard bilaterally CVS normal S1, normal S2, regular rate and rhythm Abdomen soft nontender nondistended positive bowel sounds Extremities warm well-perfused no edema  Sputum culture with moderate staph epidermidis (MRSE). MRSA negative.   Kidney function improving now at 3.45 mg/dl.  Assessment & Plan:  Case of a 77 year old male patient chronically immunosuppressed on steroid for presumed OP presenting for worsening shortness of breath found to have rhinovirus + and few gram + Cocci in clusters in sputum.   #Acute hypoxic respiratory failure secondary to... #Rhinovirus pneumonia with superimposed bacterial pneumonia sputum culture with MRSE. Opportunistic infection is not ruled out., Pending fungitell. Asp Ag negative.  #TBM (EDAC) #NSTEMI likelt type II pending eval, currently on heparin drip.  #AKI on CKD in the setting of sepsis?   Plan  []  c/w Linezolid to cover MRSE. Ok to dc Cefepime. Would treat for a total of 14 days.  []  prednisone to 40mg  PO daily . Titrate prednisone by 10mg  every 7 days until 10mg  and maintain until seen as outpatient. []  Consider gentle diuresis. []  Pulmonary hygiene with 3% hypertonic saline neb preceded by Albuterol neb Q8H. Followed by Flutter valve use and IS. cpap 5-15 at bedtime.  []  Agree with PT/OT. Will likely benefit from rehab.  I spent 50 minutes caring for this patient today, including preparing to see the patient, obtaining a medical history , reviewing a separately obtained history, performing a medically appropriate examination and/or evaluation, counseling and educating the patient/family/caregiver, documenting clinical information in  the electronic health record, and independently interpreting results (not separately reported/billed) and communicating results to the patient/family/caregiver  Janann Colonel, MD [facility] 09/04/2023 6:27 PM

## 2023-09-04 NOTE — Progress Notes (Signed)
PROGRESS NOTE    Kurt Aguilar  WUJ:[id number] DOB: 1945/11/03 DOA: 08/30/2023 PCP: Judy Pimple, MD  215A/215A-AA  LOS: 5 days   Brief hospital course:   Assessment & Plan:  Kurt Aguilar is a 77 y.o. 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   Kurt Aguilar was recently admitted from 12/12 to 12/20 for acute hypoxic respiratory failure in the setting of tracheobronchomalacia and suspected LLL pneumonia + AKI on CKD 2/2 contrast exposure.  He was discharged to SNF on 12/20.   * Acute hypoxic respiratory failure (HCC) 2/2 Rhinovirus pneumonia with superimposed possible bacterial pneumonia with MRSE Patient is presenting with AMS morning of presentation in the setting of hypoxia that resolved on placement of supplemental oxygen.  He was recently admitted for pneumonia and received 5 days of meropenem and azithromycin with additional 2 days of Rocephin.  Pulmonology has been consulted and recommending treatment for potential hospital associated pneumonia.  Hypoxia may also be related to underlying NSTEMI/HF given slightly enlarged pleural effusions in addition to ongoing tracheobronchomalacia. --started on vanc and cefepime on presentation, switched to Linezolid to cover MRSE.  --cont Linezolid, will treat for total 14 days --cont prednisone 40 mg daily, Titrate prednisone by 10mg  every 7 days until 10mg  and maintain until seen as outpatient.  --Pulmonary hygiene with 3% hypertonic saline neb preceded by Albuterol neb Q8H. Followed by Flutter valve use and IS. Consider cpap 5-15 at bedtime.   Trop elevation 2/2 demand ischemia NSTEMI ruled out --trop peaked at 341.  Echo without wall motion abnormalities.    CAD s/p CABG --cont ASA and  ezetimibe - Consider ischemic evaluation as outpatient   Tracheobronchomalacia Chronic steroid Per chart review, patient has a previous history of BOOP with evaluation by pulmonology on last admission noting clinically and radiologically more consistent with tracheobronchomalacia. --has been on chronic prednisone 20 mg daily for many years. - Pulmonology consulted --cont prednisone 40 mg daily, Titrate prednisone by 10mg  every 7 days until 10mg  and maintain until seen as outpatient.   Chronic heart failure with preserved ejection fraction (HCC) Although patient does not appear significantly hypervolemic on examination, he is noted to have slightly increased pleural effusions on CT imaging and his BNP is increased compared to prior at 336.  Given AKI, he received small IV bolus of fluids.  Will hold off on diuretics at this time and recheck renal function in the evening.  If evidence of worsening renal function, will trial a dose of Lasix, given the possibility this may be cardiorenal in nature. --start torsemide 20 mg daily tomorrow  Acute kidney injury superimposed on chronic kidney disease 4 (HCC) Patient has a history of CKD stage IV contributed by Diabetes and polycystic kidney disease, hypertension, obesity.  With AKI on most recent admission.  On discharge, his creatinine was noted to be slowly rising as high as 3.73.  On current presentation, 4.46.   --Cr improving without IVF - Nephrology consulted  Elevated LFTs Unclear etiology at this time.  No prior history of cirrhosis, and INR and platelets are within normal limits.  He is not experiencing any abdominal pain to suggest cholecystitis.  No reported alcohol use.   - Right upper quadrant ultrasound no acute finding - Hold home statin  Type 2 diabetes mellitus (HCC) --  cont home 70/30 insulin 30u daily --mealtime 5u TID - SSI, moderate  Neuropathy - Decrease home gabapentin at bedtime from 300 mg to 100 mg given AKI  Essential  hypertension - Hold home antihypertensives in the setting of high risk for hypotension.  Obstructive sleep apnea - CPAP nightly   DVT prophylaxis: Heparin SQ Code Status: DNR  Family Communication: wife updated at bedside today Level of care: Med-Surg Dispo:   The patient is from: SNF rehab Anticipated d/c is to: SNF rehab Anticipated d/c date is: whenever bed available   Subjective and Interval History:  Pt had nose bleed last night.  Still couldn't sleep well.  Severe DOE.   Objective: Vitals:   09/04/23 0805 09/04/23 0814 09/04/23 1343 09/04/23 2017  BP:  124/80  123/71  Pulse:  87  99  Resp:  18  18  Temp:  98.4 F (36.9 C)  97.6 F (36.4 C)  TempSrc:    Oral  SpO2: (!) 88% 99% 96% 98%  Weight:      Height:        Intake/Output Summary (Last 24 hours) at 09/04/2023 2059 Last data filed at 09/03/2023 2147 Gross per 24 hour  Intake --  Output 525 ml  Net -525 ml   Filed Weights   09/01/23 0229  Weight: 108.5 kg    Examination:   Constitutional: NAD, AAOx3 HEENT: conjunctivae and lids normal, EOMI CV: No cyanosis.   RESP: normal respiratory effort at rest Extremities: bruising noted on underside of right leg SKIN: warm, dry Neuro: II - XII grossly intact.   Psych: Normal mood and affect.  Appropriate judgement and reason   Data Reviewed: I have personally reviewed labs and imaging studies  Time spent: 35 minutes  Darlin Priestly, MD [facility] If 7PM-7AM, please contact night-coverage 09/04/2023, 8:59 PM

## 2023-09-05 ENCOUNTER — Other Ambulatory Visit: Payer: Self-pay

## 2023-09-05 ENCOUNTER — Encounter: Payer: Self-pay | Admitting: Internal Medicine

## 2023-09-05 ENCOUNTER — Inpatient Hospital Stay: Payer: PPO

## 2023-09-05 DIAGNOSIS — J9601 Acute respiratory failure with hypoxia: Secondary | ICD-10-CM | POA: Diagnosis not present

## 2023-09-05 LAB — BASIC METABOLIC PANEL
Anion gap: 12 (ref 5–15)
BUN: 78 mg/dL — ABNORMAL HIGH (ref 8–23)
CO2: 23 mmol/L (ref 22–32)
Calcium: 8.7 mg/dL — ABNORMAL LOW (ref 8.9–10.3)
Chloride: 104 mmol/L (ref 98–111)
Creatinine, Ser: 3.35 mg/dL — ABNORMAL HIGH (ref 0.61–1.24)
GFR, Estimated: 18 mL/min — ABNORMAL LOW (ref >60)
Glucose, Bld: 178 mg/dL — ABNORMAL HIGH (ref 70–99)
Potassium: 4.4 mmol/L (ref 3.5–5.1)
Sodium: 139 mmol/L (ref 135–145)

## 2023-09-05 LAB — CBC
HCT: 24.2 % — ABNORMAL LOW (ref 39.0–52.0)
Hemoglobin: 7.9 g/dL — ABNORMAL LOW (ref 13.0–17.0)
MCH: 33.1 pg (ref 26.0–34.0)
MCHC: 32.6 g/dL (ref 30.0–36.0)
MCV: 101.3 fL — ABNORMAL HIGH (ref 80.0–100.0)
Platelets: 225 10*3/uL (ref 150–400)
RBC: 2.39 MIL/uL — ABNORMAL LOW (ref 4.22–5.81)
RDW: 15.1 % (ref 11.5–15.5)
WBC: 10.6 10*3/uL — ABNORMAL HIGH (ref 4.0–10.5)
nRBC: 0.8 % — ABNORMAL HIGH (ref 0.0–0.2)

## 2023-09-05 LAB — GLUCOSE, CAPILLARY
Glucose-Capillary: 194 mg/dL — ABNORMAL HIGH (ref 70–99)
Glucose-Capillary: 187 mg/dL — ABNORMAL HIGH (ref 70–99)
Glucose-Capillary: 215 mg/dL — ABNORMAL HIGH (ref 70–99)
Glucose-Capillary: 238 mg/dL — ABNORMAL HIGH (ref 70–99)

## 2023-09-05 MED ORDER — METHOCARBAMOL 500 MG PO TABS
500.0000 mg | ORAL_TABLET | Freq: Every day | ORAL | Status: DC
  Administered 2023-09-05: 500 mg via ORAL
  Filled 2023-09-05: qty 1

## 2023-09-05 NOTE — Progress Notes (Signed)
2 nonintact areas near elbow and lower forearm area noted with  scant serosanq drainage of both area. cleansed with NS xeroform, gauze, and ace wrap. Pt express pain of right calf noted purple discoloration with green borders bruise that developed on the calf. When pt point toes back and forth he c/o pain and ROM seems limited due to the pain compared to ROM of left. he do not recall doing anything that could cause trauma to the area. MD Fran Lowes notified of the above. See new orders.

## 2023-09-05 NOTE — Progress Notes (Signed)
PROGRESS NOTE    Kurt Aguilar  ZOX:[id number] DOB: June 18, 1946 DOA: 08/30/2023 PCP: Judy Pimple, MD  215A/215A-AA  LOS: 6 days   Brief hospital course:   Assessment & Plan:  Kurt Aguilar is a 77 y.o. 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   Mr. Ketelsen was recently admitted from 12/12 to 12/20 for acute hypoxic respiratory failure in the setting of tracheobronchomalacia and suspected LLL pneumonia + AKI on CKD 2/2 contrast exposure.  He was discharged to SNF on 12/20.   * Acute hypoxic respiratory failure (HCC) 2/2 Rhinovirus pneumonia with superimposed possible bacterial pneumonia with MRSE Patient is presenting with AMS morning of presentation in the setting of hypoxia that resolved on placement of supplemental oxygen.  He was recently admitted for pneumonia and received 5 days of meropenem and azithromycin with additional 2 days of Rocephin.  Pulmonology has been consulted and recommending treatment for potential hospital associated pneumonia.  Hypoxia may also be related to underlying NSTEMI/HF given slightly enlarged pleural effusions in addition to ongoing tracheobronchomalacia. --started on vanc and cefepime on presentation, switched to Linezolid to cover MRSE.  Plan: --cont Linezolid, will treat for total 14 days --cont prednisone 40 mg daily, Titrate prednisone by 10mg  every 7 days until 10mg  and maintain until seen as outpatient.  --Pulmonary hygiene with 3% hypertonic saline neb preceded by Albuterol neb Q8H. Followed by Flutter valve use and IS. Consider cpap 5-15 at bedtime.  --need 2L home O2  Trop elevation 2/2 demand ischemia NSTEMI ruled out --trop peaked at 341.  Echo without wall motion abnormalities.    CAD s/p  CABG --cont ASA and ezetimibe - Consider ischemic evaluation as outpatient   Tracheobronchomalacia Chronic steroid Per chart review, patient has a previous history of BOOP with evaluation by pulmonology on last admission noting clinically and radiologically more consistent with tracheobronchomalacia. --has been on chronic prednisone 20 mg daily for many years. - Pulmonology consulted --cont prednisone 40 mg daily, Titrate prednisone by 10mg  every 7 days until 10mg  and maintain until seen as outpatient.  --need 2L home O2  Chronic heart failure with preserved ejection fraction (HCC) Although patient does not appear significantly hypervolemic on examination, he is noted to have slightly increased pleural effusions on CT imaging and his BNP is increased compared to prior at 336.  Given AKI, he received small IV bolus of fluids.  Will hold off on diuretics at this time and recheck renal function in the evening.  If evidence of worsening renal function, will trial a dose of Lasix, given the possibility this may be cardiorenal in nature. --timing of resuming home torsemide per nephro  Acute kidney injury superimposed on chronic kidney disease 4 (HCC) Patient has a history of CKD stage IV contributed by Diabetes and polycystic kidney disease, hypertension, obesity.  With AKI on most recent admission.  On discharge, his creatinine was noted to be slowly rising as high as 3.73.  On current presentation, 4.46.   --Cr improving without IVF - Nephrology consulted  Elevated LFTs Unclear etiology at this time.  No prior history of cirrhosis, and INR and platelets are within normal limits.  He is not experiencing any abdominal pain to suggest cholecystitis.  No reported alcohol use.   - Right upper quadrant ultrasound no acute finding -  Hold home statin  Type 2 diabetes mellitus (HCC) --cont home 70/30 insulin 30u daily --mealtime 5u TID --ACHS and SSI  Neuropathy - Decrease home gabapentin at  bedtime from 300 mg to 100 mg given AKI  Essential hypertension - Hold home antihypertensives in the setting of high risk for hypotension.  Obstructive sleep apnea - CPAP nightly   DVT prophylaxis: Heparin SQ Code Status: DNR  Family Communication: wife updated at bedside today Level of care: Med-Surg Dispo:   The patient is from: SNF rehab Anticipated d/c is to: SNF rehab Anticipated d/c date is: whenever bed available, possibly tomorrow   Subjective and Interval History:  Pt said he finally slept last night.   Objective: Vitals:   09/05/23 0802 09/05/23 1045 09/05/23 1050 09/05/23 1450  BP: 126/74   115/80  Pulse: 93   96  Resp: 18   18  Temp: 98 F (36.7 C)   98.2 F (36.8 C)  TempSrc:    Oral  SpO2: 98% (!) 87% 94% 100%  Weight:      Height:        Intake/Output Summary (Last 24 hours) at 09/05/2023 1811 Last data filed at 09/05/2023 1745 Gross per 24 hour  Intake 120 ml  Output 1100 ml  Net -980 ml   Filed Weights   09/01/23 0229  Weight: 108.5 kg    Examination:   Constitutional: NAD, AAOx3 HEENT: conjunctivae and lids normal, EOMI CV: No cyanosis.   RESP: normal respiratory effort, on 2L Neuro: II - XII grossly intact.   Psych: Normal mood and affect.  Appropriate judgement and reason   Data Reviewed: I have personally reviewed labs and imaging studies  Time spent: 35 minutes  Darlin Priestly, MD [facility] If 7PM-7AM, please contact night-coverage 09/05/2023, 6:11 PM

## 2023-09-05 NOTE — [place] FL2 (Signed)
[place] MEDICAID FL2 LEVEL OF CARE FORM     IDENTIFICATION  Patient Name: Kurt Aguilar Birthdate: 04/30/1946 Sex: male Admission Date (Current Location): 08/30/2023  [facility] and [place] Number:  Chiropodist and Address:  [facility], [address], [place], [place] [address]      Provider Number: [id number]  Attending Physician Name and Address:  Darlin Priestly, MD  Relative Name and Phone Number:  Emileo, Schadler (Spouse)  [phone] (Mobile)    Current Level of Care: Hospital Recommended Level of Care: Skilled Nursing Facility Prior Approval Number:    Date Approved/Denied:   PASRR Number: [id number]  Discharge Plan:      Current Diagnoses: Patient Active Problem List   Diagnosis Date Noted   Acute hypoxic respiratory failure (HCC) 08/30/2023   NSTEMI (non-ST elevated myocardial infarction) (HCC) 08/30/2023   (HFpEF) heart failure with preserved ejection fraction (HCC) 08/30/2023   Tracheobronchomalacia 08/30/2023   Elevated LFTs 08/30/2023   Acute hypoxemic respiratory failure (HCC) 08/20/2023   Muscular chest pain 08/20/2023   Vitamin B12 deficiency 04/22/2023   CKD (chronic kidney disease) stage 4, GFR 15-29 ml/min (HCC) 04/22/2023   Current use of proton pump inhibitor 04/14/2023   Nausea 01/21/2023   Lumbar radiculopathy 01/01/2023   Immunocompromised due to corticosteroids (HCC) 10/09/2022   Severe sepsis (HCC) 08/17/2022   Lumbar pain 01/30/2022   Chronic diastolic heart failure (HCC) 10/23/2021   Acute kidney injury superimposed on chronic kidney disease (HCC) 09/17/2021   Stage 3b chronic kidney disease (CKD) (HCC) 09/17/2021   Osteopenia 05/16/2021   Steroid dependent (HCC) 04/12/2021   Primary osteoarthritis of left knee 05/29/2020   Colon cancer screening 02/13/2017   Poorly controlled type 2 diabetes mellitus with circulatory disorder (HCC) 08/13/2016   Chronic heel pain, left 08/12/2016   Upper airway  cough syndrome 07/31/2016   Class 1 obesity with serious comorbidity and body mass index (BMI) of 32.0 to 32.9 in adult 05/19/2016   Encounter for general adult medical examination with abnormal findings 01/27/2016   Pulmonary infiltrates with high  ESR c/w BOOP/ idiopathic  12/19/2015   Hypotension 12/12/2015   Cough 11/29/2015   Cystic kidney disease 10/01/2015   Renal lesion 09/27/2015   Erectile dysfunction of organic origin 09/27/2015   BPH with obstruction/lower urinary tract symptoms 09/27/2015   Constipation 09/24/2015   Cyst of right kidney 09/24/2015   CAD (coronary artery disease) 06/08/2015   Grieving 11/22/2014   Encounter for Medicare annual wellness exam 07/11/2013   Prostate cancer screening 07/03/2013   Right bundle branch block 02/22/2013   Chronic low back pain 05/13/2011   Obstructive sleep apnea 03/15/2010   Hyperlipidemia associated with type 2 diabetes mellitus (HCC) 04/12/2009   Gout 04/12/2009   Essential hypertension 04/12/2009   Coronary artery disease of bypass graft of native heart with stable angina pectoris (HCC) 04/12/2009   GERD 04/12/2009   Type 2 diabetes mellitus (HCC) 04/12/2009   DIVERTICULITIS, HX OF 04/12/2009    Orientation RESPIRATION BLADDER Height & Weight     Self, Time, Situation, Place  O2 Continent Weight: 239 lb 3.2 oz (108.5 kg) Height:  5\' 7"  (170.2 cm)  BEHAVIORAL SYMPTOMS/MOOD NEUROLOGICAL BOWEL NUTRITION STATUS      Continent Diet (heart healthy/carb modified)  AMBULATORY STATUS COMMUNICATION OF NEEDS Skin   Limited Assist Verbally Skin abrasions, Bruising                       Personal Care  Assistance Level of Assistance  Bathing, Feeding, Dressing Bathing Assistance: Limited assistance Feeding assistance: Limited assistance Dressing Assistance: Limited assistance     Functional Limitations Info             SPECIAL CARE FACTORS FREQUENCY  PT (By licensed PT), OT (By licensed OT)     PT Frequency: 5  times per week OT Frequency: 5 times per week            Contractures      Additional Factors Info  Code Status, Allergies Code Status Info: Do not attempt resuscitation (DNR) -DNR-LIMITED -Do Not Intubate/DNI Allergies Info: Oxycodone-acetaminophen           Current Medications (09/05/2023):  This is the current hospital active medication list Current Facility-Administered Medications  Medication Dose Route Frequency Provider Last Rate Last Admin   acetaminophen (TYLENOL) tablet 650 mg  650 mg Oral Q6H PRN Verdene Lennert, MD       Or   acetaminophen (TYLENOL) suppository 650 mg  650 mg Rectal Q6H PRN Verdene Lennert, MD       aspirin EC tablet 81 mg  81 mg Oral Daily Verdene Lennert, MD   81 mg at 09/05/23 0930   cyanocobalamin (VITAMIN B12) tablet 500 mcg  500 mcg Oral Daily Verdene Lennert, MD   500 mcg at 09/05/23 0930   ezetimibe (ZETIA) tablet 10 mg  10 mg Oral Daily Verdene Lennert, MD   10 mg at 09/05/23 0935   famotidine (PEPCID) tablet 20 mg  20 mg Oral QHS Verdene Lennert, MD   20 mg at 09/04/23 2218   gabapentin (NEURONTIN) capsule 100 mg  100 mg Oral QHS Verdene Lennert, MD   100 mg at 09/04/23 2217   heparin injection 5,000 Units  5,000 Units Subcutaneous Q8H Lorine Bears A, MD   5,000 Units at 09/05/23 0603   insulin aspart (novoLOG) injection 0-15 Units  0-15 Units Subcutaneous TID WC Verdene Lennert, MD   3 Units at 09/05/23 1146   insulin aspart (novoLOG) injection 5 Units  5 Units Subcutaneous TID WC Darlin Priestly, MD   5 Units at 09/05/23 1146   insulin aspart protamine- aspart (NOVOLOG MIX 70/30) injection 30 Units  30 Units Subcutaneous Q breakfast Orson Aloe, RPH   30 Units at 09/05/23 0940   ipratropium-albuterol (DUONEB) 0.5-2.5 (3) MG/3ML nebulizer solution 3 mL  3 mL Nebulization BID Darlin Priestly, MD   3 mL at 09/05/23 0801   linezolid (ZYVOX) tablet 600 mg  600 mg Oral Q12H Assaker, [place], MD   600 mg at 09/05/23 0932   nitroGLYCERIN  (NITROSTAT) SL tablet 0.4 mg  0.4 mg Sublingual Q5 Min x 3 PRN Verdene Lennert, MD       ondansetron (ZOFRAN) tablet 4 mg  4 mg Oral Q6H PRN Verdene Lennert, MD       Or   ondansetron (ZOFRAN) injection 4 mg  4 mg Intravenous Q6H PRN Verdene Lennert, MD       polyethylene glycol (MIRALAX / GLYCOLAX) packet 17 g  17 g Oral Daily PRN Verdene Lennert, MD       predniSONE (DELTASONE) tablet 40 mg  40 mg Oral Q breakfast Assaker, [place], MD   40 mg at 09/05/23 0930   QUEtiapine (SEROQUEL) tablet 100 mg  100 mg Oral QHS Darlin Priestly, MD   100 mg at 09/04/23 2218   senna-docusate (Senokot-S) tablet 1 tablet  1 tablet Oral QHS Verdene Lennert, MD   1 tablet at  09/04/23 2218   sodium chloride flush (NS) 0.9 % injection 3 mL  3 mL Intravenous Q12H Verdene Lennert, MD   3 mL at 09/05/23 0935   sodium chloride HYPERTONIC 3 % nebulizer solution 4 mL  4 mL Nebulization BID Darlin Priestly, MD   4 mL at 09/05/23 0801     Discharge Medications: Please see discharge summary for a list of discharge medications.  Relevant Imaging Results:  Relevant Lab Results:   Additional Information SS #: [id number]  Selisa Tensley E Dalayna Lauter, LCSW

## 2023-09-05 NOTE — Plan of Care (Signed)
  Problem: Education: Goal: Ability to describe self-care measures that may prevent or decrease complications (Diabetes Survival Skills Education) will improve Outcome: Progressing Goal: Individualized Educational Video(s) Outcome: Progressing   

## 2023-09-05 NOTE — Progress Notes (Signed)
[facility]  PROGRESS NOTE   Subjective:   Patient seen at bedside.  Denies any chest pain or shortness of breath.  He is afebrile.  Objective:  Vital signs: Blood pressure 115/80, pulse 96, temperature 98.2 F (36.8 C), temperature source Oral, resp. rate 18, height 5\' 7"  (1.702 m), weight 108.5 kg, SpO2 100%.  Intake/Output Summary (Last 24 hours) at 09/05/2023 1628 Last data filed at 09/05/2023 1502 Gross per 24 hour  Intake 120 ml  Output 800 ml  Net -680 ml   Filed Weights   09/01/23 0229  Weight: 108.5 kg     Physical Exam: General:  No acute distress  Head:  Normocephalic, atraumatic. Moist oral mucosal membranes  Eyes:  Anicteric  Neck:  Supple  Lungs:   Clear to auscultation, normal effort  Heart:  S1S2 no rubs  Abdomen:   Soft, nontender, bowel sounds present  Extremities:  peripheral edema.  Neurologic:  Awake, alert, following commands  Skin:  No lesions  Access:     Basic Metabolic Panel: Recent Labs  Lab 09/01/23 0537 09/02/23 0435 09/02/23 1614 09/03/23 0516 09/04/23 0456 09/05/23 0550  NA 137 139  --  140 139 139  K 3.8 4.0  --  3.8 4.2 4.4  CL 97* 100  --  101 102 104  CO2 24 25  --  25 22 23   GLUCOSE 225* 218* 428* 156* 199* 178*  BUN 86* 92*  --  88* 85* 78*  CREATININE 3.83* 3.76*  --  3.70* 3.45* 3.35*  CALCIUM 8.4* 8.5*  --  8.8* 8.9 8.7*  MG 2.2 2.3  --   --   --   --    GFR: Estimated Creatinine Clearance: 21 mL/min (A) (by C-G formula based on SCr of 3.35 mg/dL (H)).  Liver Function Tests: Recent Labs  Lab 08/30/23 1328 08/31/23 0144  AST 257* 140*  ALT 403* 284*  ALKPHOS 184* 145*  BILITOT 0.8 0.8  PROT 7.1 6.1*  ALBUMIN 3.0* 2.6*   No results for input(s): "LIPASE", "AMYLASE" in the last 168 hours. No results for input(s): "AMMONIA" in the last 168 hours.  CBC: Recent Labs  Lab 08/30/23 1328 08/31/23 0144 09/01/23 0537 09/02/23 0435 09/03/23 0516 09/04/23 0456 09/05/23 0550  WBC 13.5* 12.7*  14.0* 13.7* 15.2* 11.8* 10.6*  NEUTROABS 10.8* 10.2*  --   --   --   --   --   HGB 10.8* 9.8* 9.6* 8.7* 9.0* 8.6* 7.9*  HCT 34.4* 30.3* 29.4* 26.8* 27.9* 26.6* 24.2*  MCV 102.7* 100.3* 99.3 100.0 101.5* 100.0 101.3*  PLT 331 274 266 239 247 239 225     HbA1C: Hemoglobin A1C  Date/Time Value Ref Range Status  05/08/2023 11:11 AM 6.3 (A) 4.0 - 5.6 % Final  09/05/2022 01:26 PM 7.9 (A) 4.0 - 5.6 % Final   Hgb A1c MFr Bld  Date/Time Value Ref Range Status  05/17/2020 02:44 PM 6.6 (H) 4.8 - 5.6 % Final    Comment:    (NOTE) Pre diabetes:          5.7%-6.4%  Diabetes:              >6.4%  Glycemic control for   <7.0% adults with diabetes   03/24/2019 09:25 AM 6.3 4.6 - 6.5 % Final    Comment:    Glycemic Control Guidelines for People with Diabetes:Non Diabetic:  <6%Goal of Therapy: <7%Additional Action Suggested:  >8%     Urinalysis: No results for  input(s): "COLORURINE", "LABSPEC", "PHURINE", "GLUCOSEU", "HGBUR", "BILIRUBINUR", "KETONESUR", "PROTEINUR", "UROBILINOGEN", "NITRITE", "LEUKOCYTESUR" in the last 72 hours.  Invalid input(s): "APPERANCEUR"    Imaging: No results found.   Medications:     aspirin EC  81 mg Oral Daily   cyanocobalamin  500 mcg Oral Daily   ezetimibe  10 mg Oral Daily   famotidine  20 mg Oral QHS   gabapentin  100 mg Oral QHS   heparin  5,000 Units Subcutaneous Q8H   insulin aspart  0-15 Units Subcutaneous TID WC   insulin aspart  5 Units Subcutaneous TID WC   insulin aspart protamine- aspart  30 Units Subcutaneous Q breakfast   ipratropium-albuterol  3 mL Nebulization BID   linezolid  600 mg Oral Q12H   predniSONE  40 mg Oral Q breakfast   QUEtiapine  100 mg Oral QHS   senna-docusate  1 tablet Oral QHS   sodium chloride flush  3 mL Intravenous Q12H   sodium chloride HYPERTONIC  4 mL Nebulization BID    Assessment/ Plan:     Kurt Aguilar is a 77 y.o. male with medical history significant of tracheobronchomalacia (previously  diagnosed as BOOP) on chronic steroid therapy, CAD s/p CABG (1997), CKD stage IV, hypertension, hyperlipidemia, type 2 diabetes, OSA on CPAP, osteoporosis, who was admitted for hypoxic respiratory failure and found to have pneumonia..   #1: Acute kidney injury on chronic kidney disease: Patient has a baseline creatinine of 2.2.  Acute kidney injury secondary to ATN.  Renal indices are slowly but steadily improving.  Patient has a history of polycystic kidney disease and diabetic kidney disease.  #2: Anemia: Anemia secondary to chronic kidney disease.  #3: Sepsis: Continue linezolid as ordered.  #4: Secondary hyperparathyroidism: PTH is slightly elevated but calcium and phosphorus levels are within normal limits.  #5: Diabetes: Continue insulin as ordered.  Labs and medications reviewed. Will continue to follow along with you.   LOS: 6 Lorain Childes, MD [facility] 12/28/20244:28 PM

## 2023-09-05 NOTE — TOC Initial Note (Addendum)
Transition of Care [facility]) - Initial/Assessment Note    Patient Details  Name: Kurt Aguilar MRN: [id number] Date of Birth: Jan 22, 1946  [facility] CM/SW Contact:    Liliana Cline, LCSW Phone Number: 09/05/2023, 12:24 PM  Clinical Narrative:                 Patient resides at [facility] with his spouse. Patient was recently discharged to [facility]. PT and OT recommend STR at DC this admission as well. CSW spoke with patient's wife as well as Sue Lush in Admissions who confirm patient will go to [facility] for more STR when discharged. Per MD, patient is medically ready - CSW called [employer], left a VM requesting a return call to start insurance authorization.   1:03- Call from Tammy with [employer]. Gailen Shelter for SNF [facility] and [facility].  3:48Berkley Harvey approved for SNF [phone] and [facility] [phone]. CSW asked Sue Lush at [facility] when patient can come.   4:12- Patient can return to [facility]. Updated MD.  Expected Discharge Plan: Skilled Nursing Facility Barriers to Discharge: Continued Medical Work up   Patient Goals and CMS Choice Patient states their goals for this hospitalization and ongoing recovery are:: [facility] CMS Medicare.gov Compare Post Acute Care list provided to:: Patient Represenative (must comment) Choice offered to / list presented to : Spouse      Expected Discharge Plan and Services       Living arrangements for the past 2 months: Independent Living Facility                                      Prior Living Arrangements/Services Living arrangements for the past 2 months: Independent Living Facility Lives with:: Spouse Patient language and need for interpreter reviewed:: Yes        Need for Family Participation in Patient Care: Yes (Comment) Care giver support system in place?: Yes (comment)   Criminal Activity/Legal Involvement Pertinent to Current Situation/Hospitalization: No -  Comment as needed  Activities of Daily Living   ADL Screening (condition at time of admission) Independently performs ADLs?: Yes (appropriate for developmental age) Is the patient deaf or have difficulty hearing?: No Does the patient have difficulty seeing, even when wearing glasses/contacts?: No Does the patient have difficulty concentrating, remembering, or making decisions?: No  Permission Sought/Granted Permission sought to share information with : Facility Industrial/product designer granted to share information with : Yes, Verbal Permission Granted (by spouse)     Permission granted to share info w AGENCY: [facility]        Emotional Assessment         Alcohol / Substance Use: Not Applicable Psych Involvement: No (comment)  Admission diagnosis:  Transaminitis [R74.01] AKI (acute kidney injury) (HCC) [N17.9] HCAP (healthcare-associated pneumonia) [J18.9] Sepsis, due to unspecified organism, unspecified whether acute organ dysfunction present (HCC) [A41.9] Acute hypoxic respiratory failure (HCC) [J96.01] Patient Active Problem List   Diagnosis Date Noted   Acute hypoxic respiratory failure (HCC) 08/30/2023   NSTEMI (non-ST elevated myocardial infarction) (HCC) 08/30/2023   (HFpEF) heart failure with preserved ejection fraction (HCC) 08/30/2023   Tracheobronchomalacia 08/30/2023   Elevated LFTs 08/30/2023   Acute hypoxemic respiratory failure (HCC) 08/20/2023   Muscular chest pain 08/20/2023   Vitamin B12 deficiency 04/22/2023   CKD (chronic kidney disease) stage 4, GFR 15-29 ml/min (  HCC) 04/22/2023   Current use of proton pump inhibitor 04/14/2023   Nausea 01/21/2023   Lumbar radiculopathy 01/01/2023   Immunocompromised due to corticosteroids (HCC) 10/09/2022   Severe sepsis (HCC) 08/17/2022   Lumbar pain 01/30/2022   Chronic diastolic heart failure (HCC) 10/23/2021   Acute kidney injury superimposed on chronic kidney disease (HCC) 09/17/2021   Stage 3b  chronic kidney disease (CKD) (HCC) 09/17/2021   Osteopenia 05/16/2021   Steroid dependent (HCC) 04/12/2021   Primary osteoarthritis of left knee 05/29/2020   Colon cancer screening 02/13/2017   Poorly controlled type 2 diabetes mellitus with circulatory disorder (HCC) 08/13/2016   Chronic heel pain, left 08/12/2016   Upper airway cough syndrome 07/31/2016   Class 1 obesity with serious comorbidity and body mass index (BMI) of 32.0 to 32.9 in adult 05/19/2016   Encounter for general adult medical examination with abnormal findings 01/27/2016   Pulmonary infiltrates with high  ESR c/w BOOP/ idiopathic  12/19/2015   Hypotension 12/12/2015   Cough 11/29/2015   Cystic kidney disease 10/01/2015   Renal lesion 09/27/2015   Erectile dysfunction of organic origin 09/27/2015   BPH with obstruction/lower urinary tract symptoms 09/27/2015   Constipation 09/24/2015   Cyst of right kidney 09/24/2015   CAD (coronary artery disease) 06/08/2015   Grieving 11/22/2014   Encounter for Medicare annual wellness exam 07/11/2013   Prostate cancer screening 07/03/2013   Right bundle branch block 02/22/2013   Chronic low back pain 05/13/2011   Obstructive sleep apnea 03/15/2010   Hyperlipidemia associated with type 2 diabetes mellitus (HCC) 04/12/2009   Gout 04/12/2009   Essential hypertension 04/12/2009   Coronary artery disease of bypass graft of native heart with stable angina pectoris (HCC) 04/12/2009   GERD 04/12/2009   Type 2 diabetes mellitus (HCC) 04/12/2009   DIVERTICULITIS, HX OF 04/12/2009   PCP:  Judy Pimple, MD Pharmacy:   [facility] [phone] [place], [place] - [address] [place] [address] Phone: [phone] Fax: [phone]  [facility] [address], [address] [place] [address] Phone: [phone] Fax: [phone]     Social Drivers of Health (SDOH) Social History: SDOH Screenings    Food Insecurity: No Food Insecurity (09/01/2023)  Housing: Low Risk  (09/01/2023)  Transportation Needs: No Transportation Needs (09/01/2023)  Utilities: Not At Risk (09/01/2023)  Alcohol Screen: Low Risk  (04/30/2023)  Depression (PHQ2-9): Low Risk  (04/30/2023)  Financial Resource Strain: Low Risk  (04/30/2023)  Physical Activity: Insufficiently Active (04/30/2023)  Social Connections: Socially Integrated (04/30/2023)  Stress: No Stress Concern Present (04/30/2023)  Tobacco Use: Medium Risk (09/05/2023)  Health Literacy: Adequate Health Literacy (04/30/2023)   SDOH Interventions:     Readmission Risk Interventions    09/20/2021    9:36 AM  Readmission Risk Prevention Plan  Transportation Screening Complete  Medication Review (RN Care Manager) Referral to Pharmacy  PCP or Specialist appointment within 3-5 days of discharge Not Complete  PCP/Specialist Appt Not Complete comments pending patient is not medically stable but does have a PCP  HRI or Home Care Consult Complete  SW Recovery Care/Counseling Consult Complete  Palliative Care Screening Not Applicable  Skilled Nursing Facility Not Applicable

## 2023-09-06 DIAGNOSIS — R2689 Other abnormalities of gait and mobility: Secondary | ICD-10-CM | POA: Diagnosis not present

## 2023-09-06 DIAGNOSIS — K219 Gastro-esophageal reflux disease without esophagitis: Secondary | ICD-10-CM | POA: Diagnosis not present

## 2023-09-06 DIAGNOSIS — M109 Gout, unspecified: Secondary | ICD-10-CM | POA: Diagnosis not present

## 2023-09-06 DIAGNOSIS — I080 Rheumatic disorders of both mitral and aortic valves: Secondary | ICD-10-CM | POA: Diagnosis not present

## 2023-09-06 DIAGNOSIS — R570 Cardiogenic shock: Secondary | ICD-10-CM | POA: Diagnosis not present

## 2023-09-06 DIAGNOSIS — N170 Acute kidney failure with tubular necrosis: Secondary | ICD-10-CM | POA: Diagnosis not present

## 2023-09-06 DIAGNOSIS — D649 Anemia, unspecified: Secondary | ICD-10-CM | POA: Diagnosis not present

## 2023-09-06 DIAGNOSIS — I517 Cardiomegaly: Secondary | ICD-10-CM | POA: Diagnosis not present

## 2023-09-06 DIAGNOSIS — N183 Chronic kidney disease, stage 3 unspecified: Secondary | ICD-10-CM | POA: Diagnosis not present

## 2023-09-06 DIAGNOSIS — R579 Shock, unspecified: Secondary | ICD-10-CM | POA: Diagnosis not present

## 2023-09-06 DIAGNOSIS — I130 Hypertensive heart and chronic kidney disease with heart failure and stage 1 through stage 4 chronic kidney disease, or unspecified chronic kidney disease: Secondary | ICD-10-CM | POA: Diagnosis not present

## 2023-09-06 DIAGNOSIS — N281 Cyst of kidney, acquired: Secondary | ICD-10-CM | POA: Diagnosis not present

## 2023-09-06 DIAGNOSIS — R7989 Other specified abnormal findings of blood chemistry: Secondary | ICD-10-CM | POA: Diagnosis not present

## 2023-09-06 DIAGNOSIS — I214 Non-ST elevation (NSTEMI) myocardial infarction: Secondary | ICD-10-CM | POA: Diagnosis not present

## 2023-09-06 DIAGNOSIS — R531 Weakness: Secondary | ICD-10-CM | POA: Diagnosis not present

## 2023-09-06 DIAGNOSIS — R0689 Other abnormalities of breathing: Secondary | ICD-10-CM | POA: Diagnosis not present

## 2023-09-06 DIAGNOSIS — M545 Low back pain, unspecified: Secondary | ICD-10-CM | POA: Diagnosis not present

## 2023-09-06 DIAGNOSIS — G4733 Obstructive sleep apnea (adult) (pediatric): Secondary | ICD-10-CM | POA: Diagnosis not present

## 2023-09-06 DIAGNOSIS — I5033 Acute on chronic diastolic (congestive) heart failure: Secondary | ICD-10-CM | POA: Diagnosis not present

## 2023-09-06 DIAGNOSIS — Z515 Encounter for palliative care: Secondary | ICD-10-CM | POA: Diagnosis not present

## 2023-09-06 DIAGNOSIS — Z741 Need for assistance with personal care: Secondary | ICD-10-CM | POA: Diagnosis not present

## 2023-09-06 DIAGNOSIS — R6521 Severe sepsis with septic shock: Secondary | ICD-10-CM | POA: Diagnosis not present

## 2023-09-06 DIAGNOSIS — N179 Acute kidney failure, unspecified: Secondary | ICD-10-CM | POA: Diagnosis not present

## 2023-09-06 DIAGNOSIS — I2489 Other forms of acute ischemic heart disease: Secondary | ICD-10-CM | POA: Diagnosis not present

## 2023-09-06 DIAGNOSIS — E1121 Type 2 diabetes mellitus with diabetic nephropathy: Secondary | ICD-10-CM | POA: Diagnosis not present

## 2023-09-06 DIAGNOSIS — D696 Thrombocytopenia, unspecified: Secondary | ICD-10-CM | POA: Diagnosis not present

## 2023-09-06 DIAGNOSIS — D539 Nutritional anemia, unspecified: Secondary | ICD-10-CM | POA: Diagnosis not present

## 2023-09-06 DIAGNOSIS — R278 Other lack of coordination: Secondary | ICD-10-CM | POA: Diagnosis not present

## 2023-09-06 DIAGNOSIS — D849 Immunodeficiency, unspecified: Secondary | ICD-10-CM | POA: Diagnosis not present

## 2023-09-06 DIAGNOSIS — D631 Anemia in chronic kidney disease: Secondary | ICD-10-CM | POA: Diagnosis not present

## 2023-09-06 DIAGNOSIS — Z7401 Bed confinement status: Secondary | ICD-10-CM | POA: Diagnosis not present

## 2023-09-06 DIAGNOSIS — I5032 Chronic diastolic (congestive) heart failure: Secondary | ICD-10-CM | POA: Diagnosis not present

## 2023-09-06 DIAGNOSIS — R935 Abnormal findings on diagnostic imaging of other abdominal regions, including retroperitoneum: Secondary | ICD-10-CM | POA: Diagnosis not present

## 2023-09-06 DIAGNOSIS — R339 Retention of urine, unspecified: Secondary | ICD-10-CM | POA: Diagnosis not present

## 2023-09-06 DIAGNOSIS — R4189 Other symptoms and signs involving cognitive functions and awareness: Secondary | ICD-10-CM | POA: Diagnosis not present

## 2023-09-06 DIAGNOSIS — Z8616 Personal history of COVID-19: Secondary | ICD-10-CM | POA: Diagnosis not present

## 2023-09-06 DIAGNOSIS — Z1152 Encounter for screening for COVID-19: Secondary | ICD-10-CM | POA: Diagnosis not present

## 2023-09-06 DIAGNOSIS — E1122 Type 2 diabetes mellitus with diabetic chronic kidney disease: Secondary | ICD-10-CM | POA: Diagnosis not present

## 2023-09-06 DIAGNOSIS — J8489 Other specified interstitial pulmonary diseases: Secondary | ICD-10-CM | POA: Diagnosis not present

## 2023-09-06 DIAGNOSIS — A0811 Acute gastroenteropathy due to Norwalk agent: Secondary | ICD-10-CM | POA: Diagnosis not present

## 2023-09-06 DIAGNOSIS — J90 Pleural effusion, not elsewhere classified: Secondary | ICD-10-CM | POA: Diagnosis not present

## 2023-09-06 DIAGNOSIS — J9859 Other diseases of mediastinum, not elsewhere classified: Secondary | ICD-10-CM | POA: Diagnosis not present

## 2023-09-06 DIAGNOSIS — R109 Unspecified abdominal pain: Secondary | ICD-10-CM | POA: Diagnosis not present

## 2023-09-06 DIAGNOSIS — N184 Chronic kidney disease, stage 4 (severe): Secondary | ICD-10-CM | POA: Diagnosis not present

## 2023-09-06 DIAGNOSIS — Z66 Do not resuscitate: Secondary | ICD-10-CM | POA: Diagnosis not present

## 2023-09-06 DIAGNOSIS — E1140 Type 2 diabetes mellitus with diabetic neuropathy, unspecified: Secondary | ICD-10-CM | POA: Diagnosis not present

## 2023-09-06 DIAGNOSIS — G4700 Insomnia, unspecified: Secondary | ICD-10-CM | POA: Diagnosis not present

## 2023-09-06 DIAGNOSIS — R410 Disorientation, unspecified: Secondary | ICD-10-CM | POA: Diagnosis not present

## 2023-09-06 DIAGNOSIS — G928 Other toxic encephalopathy: Secondary | ICD-10-CM | POA: Diagnosis not present

## 2023-09-06 DIAGNOSIS — R571 Hypovolemic shock: Secondary | ICD-10-CM | POA: Diagnosis not present

## 2023-09-06 DIAGNOSIS — W19XXXA Unspecified fall, initial encounter: Secondary | ICD-10-CM | POA: Diagnosis present

## 2023-09-06 DIAGNOSIS — E875 Hyperkalemia: Secondary | ICD-10-CM | POA: Diagnosis not present

## 2023-09-06 DIAGNOSIS — A419 Sepsis, unspecified organism: Secondary | ICD-10-CM | POA: Diagnosis not present

## 2023-09-06 DIAGNOSIS — I10 Essential (primary) hypertension: Secondary | ICD-10-CM | POA: Diagnosis not present

## 2023-09-06 DIAGNOSIS — I2510 Atherosclerotic heart disease of native coronary artery without angina pectoris: Secondary | ICD-10-CM | POA: Diagnosis not present

## 2023-09-06 DIAGNOSIS — Z951 Presence of aortocoronary bypass graft: Secondary | ICD-10-CM | POA: Diagnosis not present

## 2023-09-06 DIAGNOSIS — J45909 Unspecified asthma, uncomplicated: Secondary | ICD-10-CM | POA: Diagnosis not present

## 2023-09-06 DIAGNOSIS — J189 Pneumonia, unspecified organism: Secondary | ICD-10-CM | POA: Diagnosis not present

## 2023-09-06 DIAGNOSIS — E872 Acidosis, unspecified: Secondary | ICD-10-CM | POA: Diagnosis not present

## 2023-09-06 DIAGNOSIS — N189 Chronic kidney disease, unspecified: Secondary | ICD-10-CM | POA: Diagnosis not present

## 2023-09-06 DIAGNOSIS — R252 Cramp and spasm: Secondary | ICD-10-CM | POA: Diagnosis not present

## 2023-09-06 DIAGNOSIS — I6523 Occlusion and stenosis of bilateral carotid arteries: Secondary | ICD-10-CM | POA: Diagnosis not present

## 2023-09-06 DIAGNOSIS — Q613 Polycystic kidney, unspecified: Secondary | ICD-10-CM | POA: Diagnosis not present

## 2023-09-06 DIAGNOSIS — I959 Hypotension, unspecified: Secondary | ICD-10-CM | POA: Diagnosis not present

## 2023-09-06 DIAGNOSIS — R6889 Other general symptoms and signs: Secondary | ICD-10-CM | POA: Diagnosis not present

## 2023-09-06 DIAGNOSIS — R5383 Other fatigue: Secondary | ICD-10-CM | POA: Diagnosis not present

## 2023-09-06 DIAGNOSIS — G9340 Encephalopathy, unspecified: Secondary | ICD-10-CM | POA: Diagnosis not present

## 2023-09-06 DIAGNOSIS — J9601 Acute respiratory failure with hypoxia: Secondary | ICD-10-CM | POA: Diagnosis not present

## 2023-09-06 DIAGNOSIS — M7989 Other specified soft tissue disorders: Secondary | ICD-10-CM | POA: Diagnosis not present

## 2023-09-06 DIAGNOSIS — J470 Bronchiectasis with acute lower respiratory infection: Secondary | ICD-10-CM | POA: Diagnosis not present

## 2023-09-06 DIAGNOSIS — F1920 Other psychoactive substance dependence, uncomplicated: Secondary | ICD-10-CM | POA: Diagnosis not present

## 2023-09-06 DIAGNOSIS — E66811 Obesity, class 1: Secondary | ICD-10-CM | POA: Diagnosis not present

## 2023-09-06 DIAGNOSIS — K7200 Acute and subacute hepatic failure without coma: Secondary | ICD-10-CM | POA: Diagnosis not present

## 2023-09-06 DIAGNOSIS — G8929 Other chronic pain: Secondary | ICD-10-CM | POA: Diagnosis not present

## 2023-09-06 DIAGNOSIS — J398 Other specified diseases of upper respiratory tract: Secondary | ICD-10-CM | POA: Diagnosis not present

## 2023-09-06 DIAGNOSIS — E8721 Acute metabolic acidosis: Secondary | ICD-10-CM | POA: Diagnosis not present

## 2023-09-06 DIAGNOSIS — I700 Atherosclerosis of aorta: Secondary | ICD-10-CM | POA: Diagnosis not present

## 2023-09-06 LAB — HEPATIC FUNCTION PANEL
ALT: 84 U/L — ABNORMAL HIGH (ref 0–44)
AST: 32 U/L (ref 15–41)
Albumin: 3 g/dL — ABNORMAL LOW (ref 3.5–5.0)
Alkaline Phosphatase: 78 U/L (ref 38–126)
Bilirubin, Direct: 0.2 mg/dL (ref 0.0–0.2)
Indirect Bilirubin: 1 mg/dL — ABNORMAL HIGH (ref 0.3–0.9)
Total Bilirubin: 1.2 mg/dL — ABNORMAL HIGH (ref <1.2)
Total Protein: 5.4 g/dL — ABNORMAL LOW (ref 6.5–8.1)

## 2023-09-06 LAB — GLUCOSE, CAPILLARY: Glucose-Capillary: 217 mg/dL — ABNORMAL HIGH (ref 70–99)

## 2023-09-06 MED ORDER — QUETIAPINE FUMARATE 100 MG PO TABS: 100.0000 mg | ORAL_TABLET | Freq: Every day | ORAL | Status: DC

## 2023-09-06 MED ORDER — LINEZOLID 600 MG PO TABS: 600.0000 mg | ORAL_TABLET | Freq: Two times a day (BID) | ORAL | Status: AC

## 2023-09-06 MED ORDER — TORSEMIDE 40 MG PO TABS: 20.0000 mg | ORAL_TABLET | Freq: Every day | ORAL | Status: DC

## 2023-09-06 MED ORDER — GABAPENTIN 100 MG PO CAPS: 100.0000 mg | ORAL_CAPSULE | Freq: Every day | ORAL | Status: DC

## 2023-09-06 MED ORDER — METHOCARBAMOL 500 MG PO TABS: 500.0000 mg | ORAL_TABLET | Freq: Every day | ORAL | Status: AC

## 2023-09-06 MED ORDER — TORSEMIDE 10 MG PO TABS
20.0000 mg | ORAL_TABLET | Freq: Every day | ORAL | Status: DC
  Administered 2023-09-06: 20 mg via ORAL
  Filled 2023-09-06: qty 2

## 2023-09-06 MED ORDER — PREDNISONE 20 MG PO TABS: ORAL_TABLET | ORAL | Status: DC

## 2023-09-06 MED ORDER — HUMALOG KWIKPEN 200 UNIT/ML ~~LOC~~ SOPN: 10.0000 [IU] | PEN_INJECTOR | Freq: Two times a day (BID) | SUBCUTANEOUS | Status: DC

## 2023-09-06 NOTE — Progress Notes (Signed)
Nsg Discharge Note  Admit Date:  08/30/2023 Discharge date: 09/06/2023   Kurt Aguilar to be D/C'd Skilled nursing facility per MD order.  AVS completed.  Copy for chart, and copy for patient signed, and dated. Patient/caregiver able to verbalize understanding.  Discharge Medication: Allergies as of 09/06/2023       Reactions   Oxycodone-acetaminophen Other (See Comments)   Stops breathing; has tolerated Tylenol before   Oxycodone-acetaminophen Anaphylaxis        Medication List     STOP taking these medications    benzonatate 200 MG capsule Commonly known as: TESSALON       TAKE these medications    albuterol (2.5 MG/3ML) 0.083% nebulizer solution Commonly known as: PROVENTIL Take 3 mLs (2.5 mg total) by nebulization 2 (two) times daily.   allopurinol 100 MG tablet Commonly known as: ZYLOPRIM TAKE 1 TABLET BY MOUTH TWICE A DAY   Aspirin 81 81 MG tablet Generic drug: aspirin EC Take 81 mg by mouth daily. Swallow whole.   BD Pen Needle Nano 2nd Gen 32G X 4 MM Misc Generic drug: Insulin Pen Needle USE 3 TIMES A DAY   bisoprolol 5 MG tablet Commonly known as: ZEBETA Take 0.5 tablets (2.5 mg total) by mouth daily.   Calcium-Vitamin D-Minerals 600-800 MG-UNIT Chew Chew 1 tablet by mouth daily.   colchicine 0.6 MG tablet Take 1 tablet (0.6 mg total) by mouth as needed (GOUT FLARE). TAKE 1 TABLET BY MOUTH TWICE A DAY WITH A MEAL AS NEEDED FOR ACUTE FLARE OF GOUT   cyanocobalamin 500 MCG tablet Commonly known as: VITAMIN B12 Take 500 mcg by mouth daily.   ezetimibe 10 MG tablet Commonly known as: ZETIA TAKE 1 TABLET BY MOUTH EVERY DAY   famotidine 20 MG tablet Commonly known as: PEPCID TAKE 1 TABLET BY MOUTH EVERYDAY AT BEDTIME   freestyle lancets 1 EACH BY OTHER ROUTE 4 (FOUR) TIMES DAILY. E11.65   FreeStyle Libre 2 Reader Hardie Pulley USE AS INSTRUCTED TO CHECK BLOOD SUGARS.   FreeStyle Libre 2 Sensor Misc 1 Device by Does not apply route every 14  (fourteen) days.   FREESTYLE LITE test strip Generic drug: glucose blood 1 each by Other route 4 (four) times daily. E11.65   gabapentin 100 MG capsule Commonly known as: NEURONTIN Take 1 capsule (100 mg total) by mouth daily. Reduced from 300 mg. What changed:  how much to take additional instructions   GLUCOSAMINE PO Take 1,500 mg by mouth 2 (two) times daily.   HumaLOG KwikPen 200 UNIT/ML KwikPen Generic drug: insulin lispro Inject 10 Units into the skin 2 (two) times daily before a meal. Inject before lunch and dinner.   ketoconazole 2 % cream Commonly known as: NIZORAL Apply 1 Application topically 2 (two) times daily.   linezolid 600 MG tablet Commonly known as: ZYVOX Take 1 tablet (600 mg total) by mouth every 12 (twelve) hours for 10 days.   Lutein 20 MG Caps Take 20 mg by mouth daily.   meclizine 12.5 MG tablet Commonly known as: ANTIVERT TAKE 1-2 TABLETS BY MOUTH THREE TIMES A DAY AS NEEDED FOR DIZZINESS   methocarbamol 500 MG tablet Commonly known as: ROBAXIN Take 1 tablet (500 mg total) by mouth at bedtime for 7 days.   nitroGLYCERIN 0.4 MG SL tablet Commonly known as: NITROSTAT Place 1 tablet (0.4 mg total) under the tongue every 5 (five) minutes x 3 doses as needed for chest pain.   NovoLIN 70/30 Kwikpen (70-30) 100 UNIT/ML  KwikPen Generic drug: insulin isophane & regular human KwikPen Inject 30 Units into the skin daily with breakfast. And pen needles 1/day   omeprazole 40 MG capsule Commonly known as: PRILOSEC TAKE 1 CAPUSLE BY MOUTH 30- 60 MIN BEFORE YOUR FIRST AND LAST MEALS OF THE DAY   ondansetron 8 MG tablet Commonly known as: Zofran Take 1 tablet (8 mg total) by mouth every 8 (eight) hours as needed for nausea or vomiting. From narcotic pain medicine   Ozempic (2 MG/DOSE) 8 MG/3ML Sopn Generic drug: Semaglutide (2 MG/DOSE) Inject 2 mg into the skin once a week.   polyethylene glycol 17 g packet Commonly known as: MIRALAX /  GLYCOLAX Take 17 g by mouth daily.   predniSONE 20 MG tablet Commonly known as: DELTASONE Starting on 09/07/23, take 30 mg prednisone daily for 7 days, and decrease by 10mg  every 7 days until 10mg  and maintain until seen as outpatient by pulmonologist. What changed:  medication strength See the new instructions.   PRESERVISION AREDS 2 PO Take by mouth.   QUEtiapine 100 MG tablet Commonly known as: SEROQUEL Take 1 tablet (100 mg total) by mouth at bedtime for 7 days.   rosuvastatin 40 MG tablet Commonly known as: CRESTOR TAKE 1 TABLET BY MOUTH EVERY DAY   senna-docusate 8.6-50 MG tablet Commonly known as: Senokot-S Take 1 tablet by mouth at bedtime.   Torsemide 40 MG Tabs Take 20 mg by mouth daily. Reduced from 40 mg. What changed:  how much to take additional instructions               Discharge Care Instructions  (From admission, onward)           Start     Ordered   09/06/23 0000  Discharge wound care:       Comments: Apply Xeroform gauze to right arm skin tears Q day, then cover with ABD pad and kerlex - -   09/06/23 0857            Discharge Assessment: Vitals:   09/06/23 0231 09/06/23 0846  BP: 120/84 110/73  Pulse: 92 (!) 110  Resp: 20 18  Temp: 97.8 F (36.6 C) 98 F (36.7 C)  SpO2: 95% 100%   Skin clean, dry and intact without evidence of skin break down, no evidence of skin tears noted. IV catheter discontinued intact. Site without signs and symptoms of complications - no redness or edema noted at insertion site, patient denies c/o pain - only slight tenderness at site.  Dressing with slight pressure applied.  D/c Instructions-Education: Discharge instructions given to patient/family with verbalized understanding. D/c education completed with patient/family including follow up instructions, medication list, d/c activities limitations if indicated, with other d/c instructions as indicated by MD - patient able to verbalize  understanding, all questions fully answered. Patient instructed to return to ED, call 911, or call MD for any changes in condition.  Patient escorted via EMS   Adair Laundry, RN 09/06/2023 4:21 PM

## 2023-09-06 NOTE — Plan of Care (Signed)

## 2023-09-06 NOTE — Progress Notes (Signed)
Report called to [place] at facility. CM updated. Awaiting EMS pickup. Family at bedside aware of plan.

## 2023-09-06 NOTE — Discharge Summary (Signed)
Physician Discharge Summary   Kurt Aguilar  male DOB: February 27, 1946  WJX:[id number]  PCP: Judy Pimple, MD  Admit date: 08/30/2023 Discharge date: 09/06/2023  Admitted From: SNF rehab Disposition:  SNF rehab CODE STATUS: DNR  Discharge Instructions     Diet - low sodium heart healthy   Complete by: As directed    Discharge wound care:   Complete by: As directed    Apply Xeroform gauze to right arm skin tears Q day, then cover with ABD pad and kerlex [facility] Course:  For full details, please see H&P, progress notes, consult notes and ancillary notes.  Briefly,  Kurt Aguilar is a 77 y.o. male with medical history significant of tracheobronchomalacia (previously diagnosed as BOOP) on chronic steroid therapy, CAD s/p CABG (1997), CKD stage IV, hypertension, type 2 diabetes, OSA on CPAP, who presented to the ED due to shortness of breath.   Kurt Aguilar was recently admitted from 12/12 to 12/20 for acute hypoxic respiratory failure in the setting of tracheobronchomalacia and suspected LLL pneumonia + AKI on CKD 2/2 contrast exposure.  He was discharged to SNF on 12/20.   * Acute hypoxic respiratory failure (HCC) 2/2 Rhinovirus pneumonia with superimposed possible bacterial pneumonia with MRSE Patient is presenting with AMS morning of presentation in the setting of hypoxia that resolved on placement of supplemental oxygen.  He was recently admitted for pneumonia and received 5 days of meropenem and azithromycin with additional 2 days of Rocephin.   --Pulmonology has been consulted during this admission and recommending treatment for potential hospital associated pneumonia.  Hypoxia may also be related to underlying NSTEMI/HF given slightly enlarged pleural effusions in addition to ongoing tracheobronchomalacia. --started on vanc and cefepime on presentation, switched to Linezolid to cover MRSE grown from sputum culture. --pt was discharged on 10 more days of  Linezolid, to complete a total 14 day course. --Pt received Pulmonary hygiene with 3% hypertonic saline neb preceded by Albuterol neb Q8H. Followed by Flutter valve use and IS.  --Pt desat to 87% on room air even at rest the day prior to discharge.  Pt was discharged on 2L O2.   Trop elevation 2/2 demand ischemia NSTEMI ruled out --trop peaked at 341.  Cardio consulted.  Echo without wall motion abnormalities.     CAD s/p CABG --cont ASA and ezetimibe.  Statin held due to elevated LFT's, to be resumed after discharge. - cardio consulted.  Consider ischemic evaluation as outpatient    Tracheobronchomalacia Chronic steroid dependent Per chart review, patient has a previous history of BOOP with evaluation by pulmonology on last admission noting clinically and radiologically more consistent with tracheobronchomalacia. --has been on chronic prednisone 20 mg daily for many years. - Pulmonology consulted --prednisone increased to 40 mg daily during this hospitalization.  Discharged on prednisone 30 mg daily for 7 days, taper down by 10mg  every 7 days until 10mg  and maintain until seen as outpatient but pulm Dr. Larinda Buttery.   Chronic heart failure with preserved ejection fraction (HCC) Although patient does not appear significantly hypervolemic on examination, he is noted to have slightly increased pleural effusions on CT imaging and his BNP is increased compared to prior at 336.  Given AKI, he received small IV bolus of fluids.   --home torsemide held during hospitalization, and was resumed at 20 mg daily prior to discharge, per nephro rec.   Acute kidney injury superimposed on chronic kidney disease 4 (HCC) Patient has  a history of CKD stage IV contributed by Diabetes and polycystic kidney disease, hypertension, obesity.  With AKI on most recent admission.  On last discharge, his creatinine was noted to be slowly rising to 3.73 on 12/20.  Cr 4.46 on current presentation.  Nephro consulted.   --Cr  gradually improved without IVF, was 3.35 prior to discharge. --home torsemide held during current hospitalization, and was resumed at 20 mg daily prior to discharge, per nephro rec.   Elevated LFTs Unclear etiology at this time.  No prior history of cirrhosis, and INR and platelets are within normal limits.  He is not experiencing any abdominal pain to suggest cholecystitis.  No reported alcohol use.   - Right upper quadrant ultrasound showed Diffuse fatty infiltration of the liver, no acute finding --Statin held due to elevated LFT's, to be resumed after discharge.   Type 2 diabetes mellitus (HCC) --discharged back on home regimen as below.   Neuropathy - Decrease home gabapentin at bedtime from 300 mg to 100 mg given AKI and CKD.   Essential hypertension - home antihypertensives held in the setting of high risk for hypotension. --home torsemide resumed at lower dose of 20 mg daily prior to discharge.  Home bisoprolol to be resumed after discharge.   Obstructive sleep apnea - CPAP nightly  RLE cramp --cont Robaxin 500 mg nightly for 7 more days after discharge.  Mild confusion and insomnia --cont seroquel 100 mg nightly for 7 more days after discharge.   Unless noted above, medications under "STOP" list are ones pt was not taking PTA.  Discharge Diagnoses:  Principal Problem:   Acute hypoxic respiratory failure (HCC) Active Problems:   NSTEMI (non-ST elevated myocardial infarction) (HCC)   Tracheobronchomalacia   (HFpEF) heart failure with preserved ejection fraction (HCC)   Acute kidney injury superimposed on chronic kidney disease (HCC)   Elevated LFTs   Obstructive sleep apnea   Essential hypertension   Type 2 diabetes mellitus (HCC)   Steroid dependent (HCC)   30 Day Unplanned Readmission Risk Score    Flowsheet Row ED to Hosp-Admission (Current) from 08/30/2023 in [facility]  30 Day Unplanned Readmission Risk Score (%) 37.55  Filed at 09/06/2023 0801       This score is the patient's risk of an unplanned readmission within 30 days of being discharged (0 -100%). The score is based on dignosis, age, lab data, medications, orders, and past utilization.   Low:  0-14.9   Medium: 15-21.9   High: 22-29.9   Extreme: 30 and above         Discharge Instructions:  Allergies as of 09/06/2023       Reactions   Oxycodone-acetaminophen Other (See Comments)   Stops breathing; has tolerated Tylenol before   Oxycodone-acetaminophen Anaphylaxis        Medication List     STOP taking these medications    benzonatate 200 MG capsule Commonly known as: TESSALON       TAKE these medications    albuterol (2.5 MG/3ML) 0.083% nebulizer solution Commonly known as: PROVENTIL Take 3 mLs (2.5 mg total) by nebulization 2 (two) times daily.   allopurinol 100 MG tablet Commonly known as: ZYLOPRIM TAKE 1 TABLET BY MOUTH TWICE A DAY   Aspirin 81 81 MG tablet Generic drug: aspirin EC Take 81 mg by mouth daily. Swallow whole.   BD Pen Needle Nano 2nd Gen 32G X 4 MM Misc Generic drug: Insulin Pen Needle USE 3 TIMES A  DAY   bisoprolol 5 MG tablet Commonly known as: ZEBETA Take 0.5 tablets (2.5 mg total) by mouth daily.   Calcium-Vitamin D-Minerals 600-800 MG-UNIT Chew Chew 1 tablet by mouth daily.   colchicine 0.6 MG tablet Take 1 tablet (0.6 mg total) by mouth as needed (GOUT FLARE). TAKE 1 TABLET BY MOUTH TWICE A DAY WITH A MEAL AS NEEDED FOR ACUTE FLARE OF GOUT   cyanocobalamin 500 MCG tablet Commonly known as: VITAMIN B12 Take 500 mcg by mouth daily.   ezetimibe 10 MG tablet Commonly known as: ZETIA TAKE 1 TABLET BY MOUTH EVERY DAY   famotidine 20 MG tablet Commonly known as: PEPCID TAKE 1 TABLET BY MOUTH EVERYDAY AT BEDTIME   freestyle lancets 1 EACH BY OTHER ROUTE 4 (FOUR) TIMES DAILY. E11.65   FreeStyle Libre 2 Reader Hardie Pulley USE AS INSTRUCTED TO CHECK BLOOD SUGARS.   FreeStyle Libre 2 Sensor  Misc 1 Device by Does not apply route every 14 (fourteen) days.   FREESTYLE LITE test strip Generic drug: glucose blood 1 each by Other route 4 (four) times daily. E11.65   gabapentin 100 MG capsule Commonly known as: NEURONTIN Take 1 capsule (100 mg total) by mouth daily. Reduced from 300 mg. What changed:  how much to take additional instructions   GLUCOSAMINE PO Take 1,500 mg by mouth 2 (two) times daily.   HumaLOG KwikPen 200 UNIT/ML KwikPen Generic drug: insulin lispro Inject 10 Units into the skin 2 (two) times daily before a meal. Inject before lunch and dinner.   ketoconazole 2 % cream Commonly known as: NIZORAL Apply 1 Application topically 2 (two) times daily.   linezolid 600 MG tablet Commonly known as: ZYVOX Take 1 tablet (600 mg total) by mouth every 12 (twelve) hours for 10 days.   Lutein 20 MG Caps Take 20 mg by mouth daily.   meclizine 12.5 MG tablet Commonly known as: ANTIVERT TAKE 1-2 TABLETS BY MOUTH THREE TIMES A DAY AS NEEDED FOR DIZZINESS   methocarbamol 500 MG tablet Commonly known as: ROBAXIN Take 1 tablet (500 mg total) by mouth at bedtime for 7 days.   nitroGLYCERIN 0.4 MG SL tablet Commonly known as: NITROSTAT Place 1 tablet (0.4 mg total) under the tongue every 5 (five) minutes x 3 doses as needed for chest pain.   NovoLIN 70/30 Kwikpen (70-30) 100 UNIT/ML KwikPen Generic drug: insulin isophane & regular human KwikPen Inject 30 Units into the skin daily with breakfast. And pen needles 1/day   omeprazole 40 MG capsule Commonly known as: PRILOSEC TAKE 1 CAPUSLE BY MOUTH 30- 60 MIN BEFORE YOUR FIRST AND LAST MEALS OF THE DAY   ondansetron 8 MG tablet Commonly known as: Zofran Take 1 tablet (8 mg total) by mouth every 8 (eight) hours as needed for nausea or vomiting. From narcotic pain medicine   Ozempic (2 MG/DOSE) 8 MG/3ML Sopn Generic drug: Semaglutide (2 MG/DOSE) Inject 2 mg into the skin once a week.   polyethylene glycol 17 g  packet Commonly known as: MIRALAX / GLYCOLAX Take 17 g by mouth daily.   predniSONE 20 MG tablet Commonly known as: DELTASONE Starting on 09/07/23, take 30 mg prednisone daily for 7 days, and decrease by 10mg  every 7 days until 10mg  and maintain until seen as outpatient by pulmonologist. What changed:  medication strength See the new instructions.   PRESERVISION AREDS 2 PO Take by mouth.   QUEtiapine 100 MG tablet Commonly known as: SEROQUEL Take 1 tablet (100 mg total) by mouth  at bedtime for 7 days.   rosuvastatin 40 MG tablet Commonly known as: CRESTOR TAKE 1 TABLET BY MOUTH EVERY DAY   senna-docusate 8.6-50 MG tablet Commonly known as: Senokot-S Take 1 tablet by mouth at bedtime.   Torsemide 40 MG Tabs Take 20 mg by mouth daily. Reduced from 40 mg. What changed:  how much to take additional instructions               Discharge Care Instructions  (From admission, onward)           Start     Ordered   09/06/23 0000  Discharge wound care:       Comments: Apply Xeroform gauze to right arm skin tears Q day, then cover with ABD pad and kerlex - -   09/06/23 0857             Follow-up Information     Assaker, [place] Bali, MD Follow up in 2 week(s).   Specialty: Pulmonary Disease Contact information: [address] [place] [address] [phone]         Judy Pimple, MD Follow up.   Specialties: Family Medicine, Radiology Contact information: [address] [place] [address] [phone]                 Allergies  Allergen Reactions   Oxycodone-Acetaminophen Other (See Comments)    Stops breathing; has tolerated Tylenol before   Oxycodone-Acetaminophen Anaphylaxis     The results of significant diagnostics from this hospitalization (including imaging, microbiology, ancillary and laboratory) are listed below for reference.   Consultations:   Procedures/Studies: US Venous Img Lower  Unilateral Right (DVT) Result Date: 09/05/2023 CLINICAL DATA:  Right leg pain, initial encounter EXAM: RIGHT LOWER EXTREMITY VENOUS DOPPLER ULTRASOUND TECHNIQUE: Gray-scale sonography with graded compression, as well as color Doppler and duplex ultrasound were performed to evaluate the lower extremity deep venous systems from the level of the common femoral vein and including the common femoral, femoral, profunda femoral, popliteal and calf veins including the posterior tibial, peroneal and gastrocnemius veins when visible. The superficial great saphenous vein was also interrogated. Spectral Doppler was utilized to evaluate flow at rest and with distal augmentation maneuvers in the common femoral, femoral and popliteal veins. COMPARISON:  None Available. FINDINGS: Contralateral Common Femoral Vein: Respiratory phasicity is normal and symmetric with the symptomatic side. No evidence of thrombus. Normal compressibility. Common Femoral Vein: No evidence of thrombus. Normal compressibility, respiratory phasicity and response to augmentation. Saphenofemoral Junction: No evidence of thrombus. Normal compressibility and flow on color Doppler imaging. Profunda Femoral Vein: No evidence of thrombus. Normal compressibility and flow on color Doppler imaging. Femoral Vein: No evidence of thrombus. Normal compressibility, respiratory phasicity and response to augmentation. Popliteal Vein: No evidence of thrombus. Normal compressibility, respiratory phasicity and response to augmentation. Calf Veins: No evidence of thrombus. Normal compressibility and flow on color Doppler imaging. Superficial Great Saphenous Vein: No evidence of thrombus. Normal compressibility. Venous Reflux:  None. Other Findings:  None. IMPRESSION: No evidence of deep venous thrombosis. Electronically Signed   By: Alcide Clever M.D.   On: 09/05/2023 19:01   ECHOCARDIOGRAM COMPLETE Result Date: 08/31/2023    ECHOCARDIOGRAM REPORT   Patient Name:   Kurt Aguilar  [facility] Date of Exam: 08/31/2023 Medical Rec #:  [id number]        Height:       70.0 in Accession #:    [id number]  Weight:       258.6 lb Date of Birth:  1946/01/11        BSA:          2.328 m Patient Age:    55 years         BP:           123/73 mmHg Patient Gender: M                HR:           86 bpm. Exam Location:  [facility] Procedure: 2D Echo, Color Doppler, Cardiac Doppler and Intracardiac            Opacification Agent Indications:     NSTEMI I21.4  History:         Patient has prior history of Echocardiogram examinations, most                  recent 02/05/2023. Previous Myocardial Infarction, Prior CABG;                  Risk Factors:Hypertension and Dyslipidemia.  Sonographer:     Cristela Blue Referring Phys:  [id number] Verdene Lennert Diagnosing Phys: Lorine Bears MD IMPRESSIONS  1. Left ventricular ejection fraction, by estimation, is 55 to 60%. The left ventricle has normal function. The left ventricle has no regional wall motion abnormalities. There is mild left ventricular hypertrophy. Left ventricular diastolic parameters are indeterminate.  2. Right ventricular systolic function is normal. The right ventricular size is normal. There is normal pulmonary artery systolic pressure.  3. Left atrial size was moderately dilated.  4. The mitral valve is degenerative. No evidence of mitral valve regurgitation. Mild mitral stenosis. The mean mitral valve gradient is 4.0 mmHg. Severe mitral annular calcification.  5. The aortic valve is calcified. Aortic valve regurgitation is not visualized. Mild to moderate aortic valve stenosis. AV gradient does not seem to be accurate. Stenosis degree is assesed visually. FINDINGS  Left Ventricle: Left ventricular ejection fraction, by estimation, is 55 to 60%. The left ventricle has normal function. The left ventricle has no regional wall motion abnormalities. Definity contrast agent was given IV to delineate the left ventricular  endocardial borders. The left  ventricular internal cavity size was normal in size. There is mild left ventricular hypertrophy. Left ventricular diastolic parameters are indeterminate. Right Ventricle: The right ventricular size is normal. No increase in right ventricular wall thickness. Right ventricular systolic function is normal. There is normal pulmonary artery systolic pressure. The tricuspid regurgitant velocity is 2.42 m/s, and  with an assumed right atrial pressure of 5 mmHg, the estimated right ventricular systolic pressure is 28.4 mmHg. Left Atrium: Left atrial size was moderately dilated. Right Atrium: Right atrial size was normal in size. Pericardium: There is no evidence of pericardial effusion. Mitral Valve: The mitral valve is degenerative in appearance. Severe mitral annular calcification. No evidence of mitral valve regurgitation. Mild mitral valve stenosis. MV peak gradient, 11.6 mmHg. The mean mitral valve gradient is 4.0 mmHg. Tricuspid Valve: The tricuspid valve is normal in structure. Tricuspid valve regurgitation is mild . No evidence of tricuspid stenosis. Aortic Valve: The aortic valve is calcified. Aortic valve regurgitation is not visualized. Mild to moderate aortic stenosis is present. Aortic valve mean gradient measures 2.0 mmHg. Aortic valve peak gradient measures 3.0 mmHg. Aortic valve area, by VTI measures 3.47 cm. Pulmonic Valve: The pulmonic valve was normal in structure. Pulmonic valve regurgitation is not visualized. No evidence of pulmonic stenosis.  Aorta: The aortic root is normal in size and structure. Venous: The inferior vena cava was not well visualized. IAS/Shunts: No atrial level shunt detected by color flow Doppler.  LEFT VENTRICLE PLAX 2D LVIDd:         4.00 cm   Diastology LVIDs:         3.05 cm   LV e' medial:    5.33 cm/s LV PW:         1.10 cm   LV E/e' medial:  22.1 LV IVS:        1.40 cm   LV e' lateral:   7.29 cm/s LVOT diam:     2.00 cm   LV E/e' lateral: 16.2 LV SV:         53 LV SV Index:    23 LVOT Area:     3.14 cm  RIGHT VENTRICLE RV Basal diam:  3.60 cm RV Mid diam:    3.40 cm RV S prime:     9.90 cm/s TAPSE (M-mode): 1.8 cm LEFT ATRIUM             Index        RIGHT ATRIUM           Index LA diam:        4.90 cm 2.10 cm/m   RA Area:     19.50 cm LA Vol (A2C):   81.6 ml 35.05 ml/m  RA Volume:   49.40 ml  21.22 ml/m LA Vol (A4C):   67.5 ml 28.99 ml/m LA Biplane Vol: 73.7 ml 31.66 ml/m  AORTIC VALVE AV Area (Vmax):    3.04 cm AV Area (Vmean):   2.80 cm AV Area (VTI):     3.47 cm AV Vmax:           86.50 cm/s AV Vmean:          63.500 cm/s AV VTI:            0.152 m AV Peak Grad:      3.0 mmHg AV Mean Grad:      2.0 mmHg LVOT Vmax:         83.80 cm/s LVOT Vmean:        56.500 cm/s LVOT VTI:          0.168 m LVOT/AV VTI ratio: 1.11  AORTA Ao Root diam: 3.30 cm MITRAL VALVE                TRICUSPID VALVE MV Area (PHT): 2.68 cm     TR Peak grad:   23.4 mmHg MV Area VTI:   1.38 cm     TR Vmax:        242.00 cm/s MV Peak grad:  11.6 mmHg MV Mean grad:  4.0 mmHg     SHUNTS MV Vmax:       1.70 m/s     Systemic VTI:  0.17 m MV Vmean:      98.8 cm/s    Systemic Diam: 2.00 cm MV Decel Time: 283 msec MV E velocity: 118.00 cm/s MV A velocity: 140.00 cm/s MV E/A ratio:  0.84 Lorine Bears MD Electronically signed by Lorine Bears MD Signature Date/Time: 08/31/2023/3:20:37 PM    Final    NM Pulmonary Perfusion Result Date: 08/31/2023 CLINICAL DATA:  Worsening shortness of breath. EXAM: NUCLEAR MEDICINE PERFUSION LUNG SCAN TECHNIQUE: Perfusion images were obtained in multiple projections after intravenous injection of radiopharmaceutical. Ventilation scans intentionally deferred if perfusion scan and chest x-ray adequate for  interpretation during COVID 19 epidemic. RADIOPHARMACEUTICALS:  4.28 mCi Tc-71m MAA IV COMPARISON:  Chest CT and radiograph 08/30/2023 FINDINGS: Comparison chest radiograph demonstrates airspace opacities in the left lower lobe. Perfusion imaging demonstrates no segmental or  subsegmental perfusion abnormalities. Perfusion defect in the left lower lobe corresponds to airspace opacity seen on prior imaging. IMPRESSION: Nonsegmental perfusion defect correlating to airspace opacities seen on prior imaging. Low probability of pulmonary embolism. Electronically Signed   By: Minerva Fester M.D.   On: 08/31/2023 10:42   US Abdomen Complete Result Date: 08/30/2023 CLINICAL DATA:  Acute kidney injury. Elevated liver function studies. EXAM: ABDOMEN ULTRASOUND COMPLETE COMPARISON:  CT abdomen and pelvis 08/17/2022 FINDINGS: Gallbladder: No gallstones or wall thickening visualized. No sonographic Murphy sign noted by sonographer. Common bile duct: Diameter: 3 mm, normal Liver: Diffusely increased hepatic parenchymal echotexture suggesting fatty infiltration. Subcentimeter cysts. Portal vein is patent on color Doppler imaging with normal direction of blood flow towards the liver. IVC: No abnormality visualized. Pancreas: Visualized portion unremarkable. Spleen: Size and appearance within normal limits. Right Kidney: Length: 15.3 cm. Multiple parenchymal cysts are demonstrated with benign appearance. Largest measures 6.1 cm maximal diameter. Changes are consistent with polycystic kidney. No hydronephrosis. No solid mass identified. No imaging follow-up is indicated. Left Kidney: Length: 12.5 cm. Multiple parenchymal cysts are demonstrated with benign appearance. Largest measures 4.8 cm in maximal diameter. Changes are consistent with polycystic kidney. No hydronephrosis or solid mass identified. No follow-up imaging is indicated. Abdominal aorta: No aneurysm visualized. Other findings: Examination is somewhat technically limited due to body habitus, bowel gas, and limited mobility. IMPRESSION: 1. Diffuse fatty infiltration of the liver. 2. Polycystic kidneys without evidence of obstruction. Electronically Signed   By: Burman Nieves M.D.   On: 08/30/2023 20:25   CT CHEST WO CONTRAST Result  Date: 08/30/2023 CLINICAL DATA:  Recent history of pneumonia/sepsis. Productive cough. EXAM: CT CHEST WITHOUT CONTRAST TECHNIQUE: Multidetector CT imaging of the chest was performed following the standard protocol without IV contrast. RADIATION DOSE REDUCTION: This exam was performed according to the departmental dose-optimization program which includes automated exposure control, adjustment of the mA and/or kV according to patient size and/or use of iterative reconstruction technique. COMPARISON:  CT chest dated August 22, 2023. FINDINGS: Cardiovascular: Normal heart size. No pericardial effusion. Atherosclerosis of the thoracic aorta and arch branch vessels. Multivessel coronary artery calcifications. Aortic valve and mitral annulus calcifications. Prior CABG. Mediastinum/Nodes: Redemonstration of multiple prominent mediastinal lymph nodes, similar to slightly decreased in size compared to the prior exam. Calcified mediastinal and right hilar granulomatous nodes. Secretions noted in the left mainstem bronchus. Thyroid gland and esophagus demonstrate no significant abnormality. Lungs/Pleura: Similar small right and trace left pleural effusions. Calcified pleural plaques in the left hemithorax. Patchy bilateral multifocal peripheral predominant consolidation is again noted with increased consolidative changes at the right lung base and the superior aspect of the posterior left lower lobe. No pneumothorax. Upper Abdomen: No acute abnormality. Musculoskeletal: Intact median sternotomy wires. No suspicious osseous lesion. No acute osseous abnormality. IMPRESSION: 1. Multilobar pneumonia with increased airspace consolidation at the right lung base and the posterior left lower lobe. 2. Similar small right and trace left pleural effusions. 3. Additional unchanged ancillary findings, as described above. Aortic Atherosclerosis (ICD10-I70.0). Electronically Signed   By: Hart Robinsons M.D.   On: 08/30/2023 17:03   DG  Chest 2 View Result Date: 08/30/2023 CLINICAL DATA:  Cough and dyspnea. Recent hospitalization for pneumonia/sepsis. EXAM: CHEST - 2 VIEW COMPARISON:  08/22/2023 FINDINGS: Lungs are hypoinflated demonstrate persistent opacification over the left base likely infection with associated small left pleural effusion. Possible small amount right pleural fluid. Calcified pleural plaques over the lateral left upper lung/apex. Mild stable cardiomegaly. Remainder of the exam is unchanged. IMPRESSION: 1. Persistent opacification over the left base likely infection with associated small left pleural effusion. Possible small amount right pleural fluid. 2. Stable cardiomegaly. Electronically Signed   By: Elberta Fortis M.D.   On: 08/30/2023 13:43   CT Chest High Resolution Result Date: 08/23/2023 CLINICAL DATA:  77 year old male with history of interstitial lung disease. Shortness of breath. EXAM: CT CHEST WITHOUT CONTRAST TECHNIQUE: Multidetector CT imaging of the chest was performed following the standard protocol without intravenous contrast. High resolution imaging of the lungs, as well as inspiratory and expiratory imaging, was performed. RADIATION DOSE REDUCTION: This exam was performed according to the departmental dose-optimization program which includes automated exposure control, adjustment of the mA and/or kV according to patient size and/or use of iterative reconstruction technique. COMPARISON:  Chest CTA 12/19/2015. FINDINGS: Cardiovascular: Heart size is normal. There is no significant pericardial fluid, thickening or pericardial calcification. There is aortic atherosclerosis, as well as atherosclerosis of the great vessels of the mediastinum and the coronary arteries, including calcified atherosclerotic plaque in the left main, left anterior descending, left circumflex and right coronary arteries. Status post median sternotomy for CABG including LIMA to the LAD. Severe calcifications of the aortic valve and  mitral annulus. Mediastinum/Nodes: Multiple prominent borderline enlarged mediastinal and hilar lymph nodes are incidentally noted. No definite pathologically enlarged lymph nodes are identified. Densely calcified subcarinal and right hilar lymph nodes are also incidentally noted. Esophagus is unremarkable in appearance. No axillary lymphadenopathy. Lungs/Pleura: Small right and trace left pleural effusions lying dependently. Calcified pleural plaques in the left hemithorax. No calcified pleural plaques in the right hemithorax. Patchy multifocal peripheral predominant airspace consolidation throughout the lungs bilaterally, most evident in the dependent portions of the right lower lobe and right upper lobe. High-resolution images also demonstrates some patchy areas of ground-glass attenuation, septal thickening and thickening of the peribronchovascular interstitium, however, today's study is overall limited by motion and the presence of what appears to be acute airspace consolidation, limiting accurate assessment for underlying interstitial lung disease. Inspiratory and expiratory imaging demonstrates severe collapse of the trachea and mainstem bronchi during expiration indicative of tracheobronchomalacia. Upper Abdomen: Partially imaged low-attenuation lesion in the upper left retroperitoneum measuring at least 3 cm in diameter, incompletely characterized on today's noncontrast CT examination, but statistically likely an exophytic cyst in the upper pole of the left kidney (no imaging follow-up recommended). Diffuse low attenuation throughout the visualized hepatic parenchyma, indicative of a background of hepatic steatosis. Musculoskeletal: Median sternotomy wires. There are no aggressive appearing lytic or blastic lesions noted in the visualized portions of the skeleton. IMPRESSION: 1. Limited examination for assessment of interstitial lung disease based on presence of what appears to be multilobar pneumonia, most  severe in the right lower lobe. If there is persistent clinical concern for interstitial lung disease (which is likely present in the background on today's study), repeat high-resolution chest CT should be performed after resolution of the patient's acute illness on an outpatient basis. 2. Small right and trace left pleural effusions lying dependently. 3. Calcified pleural plaques in the left hemithorax suggesting prior left-sided empyema and/or left-sided thoracic trauma. No right-sided calcified pleural plaques are noted to indicate asbestos related pleural disease at this time. 4. Severe tracheobronchomalacia. 5. Aortic  atherosclerosis, in addition to left main and three-vessel coronary artery disease. Status post median sternotomy for CABG including LIMA to the LAD. 6. There are calcifications of the aortic valve and mitral annulus. Echocardiographic correlation for evaluation of potential valvular dysfunction may be warranted if clinically indicated. 7. Additional incidental findings, as above. Aortic Atherosclerosis (ICD10-I70.0). Electronically Signed   By: Trudie Reed M.D.   On: 08/23/2023 06:23   DG Chest 1 View Result Date: 08/22/2023 CLINICAL DATA:  Tachypnea EXAM: CHEST  1 VIEW COMPARISON:  Chest x-ray 08/20/2023 FINDINGS: Cardiomediastinal silhouette is enlarged, unchanged. There is a stable small left pleural effusion with patchy left basilar opacities. Left-sided calcified pleural plaques are again noted. There is no pneumothorax or acute fracture. Sternotomy wires are present. IMPRESSION: 1. Stable small left pleural effusion with patchy left basilar opacities. 2. Left-sided calcified pleural plaques. Electronically Signed   By: Darliss Cheney M.D.   On: 08/22/2023 19:08   DG Chest Port 1 View Result Date: 08/20/2023 CLINICAL DATA:  Questionable sepsis - evaluate for abnormality. EXAM: PORTABLE CHEST 1 VIEW COMPARISON:  06/19/2023. FINDINGS: Low lung volume. There is new left basilar  opacity with probable trace left pleural effusion. There is minimal blunting of right lateral costophrenic angle as well, which may represent trace right pleural effusion. Bilateral lungs are otherwise clear. Redemonstration of left lateral pleural calcification, nonspecific but commonly seen with asbestos related disease. No pneumothorax on either side. No pulmonary edema. Stable cardio-mediastinal silhouette. No acute osseous abnormalities. The soft tissues are within normal limits. IMPRESSION: *New left basilar opacity, which may represent combination of atelectasis and/or pneumonia. There is associated trace left pleural effusion. Follow up to clearing is recommended. *Probable new trace right pleural effusion.  No pulmonary edema. Electronically Signed   By: Jules Schick M.D.   On: 08/20/2023 13:21      Labs: BNP (last 3 results) Recent Labs    08/30/23 1328  BNP 336.3*   Basic Metabolic Panel: Recent Labs  Lab 09/01/23 0537 09/02/23 0435 09/02/23 1614 09/03/23 0516 09/04/23 0456 09/05/23 0550  NA 137 139  --  140 139 139  K 3.8 4.0  --  3.8 4.2 4.4  CL 97* 100  --  101 102 104  CO2 24 25  --  25 22 23   GLUCOSE 225* 218* 428* 156* 199* 178*  BUN 86* 92*  --  88* 85* 78*  CREATININE 3.83* 3.76*  --  3.70* 3.45* 3.35*  CALCIUM 8.4* 8.5*  --  8.8* 8.9 8.7*  MG 2.2 2.3  --   --   --   --    Liver Function Tests: Recent Labs  Lab 08/30/23 1328 08/31/23 0144  AST 257* 140*  ALT 403* 284*  ALKPHOS 184* 145*  BILITOT 0.8 0.8  PROT 7.1 6.1*  ALBUMIN 3.0* 2.6*   No results for input(s): "LIPASE", "AMYLASE" in the last 168 hours. No results for input(s): "AMMONIA" in the last 168 hours. CBC: Recent Labs  Lab 08/30/23 1328 08/31/23 0144 09/01/23 0537 09/02/23 0435 09/03/23 0516 09/04/23 0456 09/05/23 0550  WBC 13.5* 12.7* 14.0* 13.7* 15.2* 11.8* 10.6*  NEUTROABS 10.8* 10.2*  --   --   --   --   --   HGB 10.8* 9.8* 9.6* 8.7* 9.0* 8.6* 7.9*  HCT 34.4* 30.3* 29.4*  26.8* 27.9* 26.6* 24.2*  MCV 102.7* 100.3* 99.3 100.0 101.5* 100.0 101.3*  PLT 331 274 266 239 247 239 225   Cardiac Enzymes: No results for  input(s): "CKTOTAL", "CKMB", "CKMBINDEX", "TROPONINI" in the last 168 hours. BNP: Invalid input(s): "POCBNP" CBG: Recent Labs  Lab 09/05/23 0803 09/05/23 1135 09/05/23 1603 09/05/23 2207 09/06/23 0845  GLUCAP 194* 187* 215* 238* 217*   D-Dimer No results for input(s): "DDIMER" in the last 72 hours. Hgb A1c No results for input(s): "HGBA1C" in the last 72 hours. Lipid Profile No results for input(s): "CHOL", "HDL", "LDLCALC", "TRIG", "CHOLHDL", "LDLDIRECT" in the last 72 hours. Thyroid function studies No results for input(s): "TSH", "T4TOTAL", "T3FREE", "THYROIDAB" in the last 72 hours.  Invalid input(s): "FREET3" Anemia work up No results for input(s): "VITAMINB12", "FOLATE", "FERRITIN", "TIBC", "IRON", "RETICCTPCT" in the last 72 hours. Urinalysis    Component Value Date/Time   COLORURINE STRAW (A) 08/30/2023 1626   APPEARANCEUR CLEAR (A) 08/30/2023 1626   LABSPEC 1.006 08/30/2023 1626   PHURINE 5.0 08/30/2023 1626   GLUCOSEU NEGATIVE 08/30/2023 1626   GLUCOSEU NEGATIVE 11/25/2010 0951   HGBUR SMALL (A) 08/30/2023 1626   BILIRUBINUR NEGATIVE 08/30/2023 1626   BILIRUBINUR neg. 08/30/2012 1548   KETONESUR NEGATIVE 08/30/2023 1626   PROTEINUR NEGATIVE 08/30/2023 1626   UROBILINOGEN 0.2 08/30/2012 1548   UROBILINOGEN 0.2 11/25/2010 0951   NITRITE NEGATIVE 08/30/2023 1626   LEUKOCYTESUR NEGATIVE 08/30/2023 1626   Sepsis Labs Recent Labs  Lab 09/02/23 0435 09/03/23 0516 09/04/23 0456 09/05/23 0550  WBC 13.7* 15.2* 11.8* 10.6*   Microbiology Recent Results (from the past 240 hours)  Resp panel by RT-PCR (RSV, Flu A&B, Covid) Anterior Nasal Swab     Status: None   Collection Time: 08/30/23  1:28 PM   Specimen: Anterior Nasal Swab  Result Value Ref Range Status   SARS Coronavirus 2 by RT PCR NEGATIVE NEGATIVE Final     Comment: (NOTE) SARS-CoV-2 target nucleic acids are NOT DETECTED.  The SARS-CoV-2 RNA is generally detectable in upper respiratory specimens during the acute phase of infection. The lowest concentration of SARS-CoV-2 viral copies this assay can detect is 138 copies/mL. A negative result does not preclude SARS-Cov-2 infection and should not be used as the sole basis for treatment or other patient management decisions. A negative result may occur with  improper specimen collection/handling, submission of specimen other than nasopharyngeal swab, presence of viral mutation(s) within the areas targeted by this assay, and inadequate number of viral copies(<138 copies/mL). A negative result must be combined with clinical observations, patient history, and epidemiological information. The expected result is Negative.  Fact Sheet for Patients:  BloggerCourse.com  Fact Sheet for Healthcare Providers:  SeriousBroker.it  This test is no t yet approved or cleared by the [place] FDA and  has been authorized for detection and/or diagnosis of SARS-CoV-2 by FDA under an Emergency Use Authorization (EUA). This EUA will remain  in effect (meaning this test can be used) for the duration of the COVID-19 declaration under Section 564(b)(1) of the Act, 21 U.S.C.section 360bbb-3(b)(1), unless the authorization is terminated  or revoked sooner.       Influenza A by PCR NEGATIVE NEGATIVE Final   Influenza B by PCR NEGATIVE NEGATIVE Final    Comment: (NOTE) The Xpert Xpress SARS-CoV-2/FLU/RSV plus assay is intended as an aid in the diagnosis of influenza from Nasopharyngeal swab specimens and should not be used as a sole basis for treatment. Nasal washings and aspirates are unacceptable for Xpert Xpress SARS-CoV-2/FLU/RSV testing.  Fact Sheet for Patients: BloggerCourse.com  Fact Sheet for Healthcare  Providers: SeriousBroker.it  This test is not yet approved or cleared by the  [place] [employer] trader and has been authorized for detection and/or diagnosis of SARS-CoV-2 by FDA under an TEFL teacher (EUA). This EUA will remain in effect (meaning this test can be used) for the duration of the COVID-19 declaration under Section 564(b)(1) of the Act, 21 U.S.C. section 360bbb-3(b)(1), unless the authorization is terminated or revoked.     Resp Syncytial Virus by PCR NEGATIVE NEGATIVE Final    Comment: (NOTE) Fact Sheet for Patients: BloggerCourse.com  Fact Sheet for Healthcare Providers: SeriousBroker.it  This test is not yet approved or cleared by the [place] FDA and has been authorized for detection and/or diagnosis of SARS-CoV-2 by FDA under an Emergency Use Authorization (EUA). This EUA will remain in effect (meaning this test can be used) for the duration of the COVID-19 declaration under Section 564(b)(1) of the Act, 21 U.S.C. section 360bbb-3(b)(1), unless the authorization is terminated or revoked.  Performed at [facility], [address] [place], [place] [address]   Blood Culture (routine x 2)     Status: None   Collection Time: 08/30/23  2:02 PM   Specimen: BLOOD  Result Value Ref Range Status   Specimen Description BLOOD BLOOD RIGHT ARM  Final   Special Requests   Final    BOTTLES DRAWN AEROBIC AND ANAEROBIC Blood Culture adequate volume   Culture   Final    NO GROWTH 5 DAYS Performed at [facility], [address] [place], [place] [address]    Report Status 09/04/2023 FINAL  Final  Blood Culture (routine x 2)     Status: None   Collection Time: 08/30/23  2:02 PM   Specimen: BLOOD  Result Value Ref Range Status   Specimen Description BLOOD BLOOD RIGHT ARM  Final   Special Requests   Final    BOTTLES DRAWN AEROBIC AND ANAEROBIC Blood Culture adequate  volume   Culture   Final    NO GROWTH 5 DAYS Performed at [facility], [address] [place], [place] [address]    Report Status 09/04/2023 FINAL  Final  Respiratory (~20 pathogens) panel by PCR     Status: Abnormal   Collection Time: 08/30/23  3:47 PM   Specimen: Nasopharyngeal Swab; Respiratory  Result Value Ref Range Status   Adenovirus NOT DETECTED NOT DETECTED Final   Coronavirus 229E NOT DETECTED NOT DETECTED Final    Comment: (NOTE) The Coronavirus on the Respiratory Panel, DOES NOT test for the novel  Coronavirus (2019 nCoV)    Coronavirus HKU1 NOT DETECTED NOT DETECTED Final   Coronavirus NL63 NOT DETECTED NOT DETECTED Final   Coronavirus OC43 NOT DETECTED NOT DETECTED Final   Metapneumovirus NOT DETECTED NOT DETECTED Final   Rhinovirus / Enterovirus DETECTED (A) NOT DETECTED Final   Influenza A NOT DETECTED NOT DETECTED Final   Influenza B NOT DETECTED NOT DETECTED Final   Parainfluenza Virus 1 NOT DETECTED NOT DETECTED Final   Parainfluenza Virus 2 NOT DETECTED NOT DETECTED Final   Parainfluenza Virus 3 NOT DETECTED NOT DETECTED Final   Parainfluenza Virus 4 NOT DETECTED NOT DETECTED Final   Respiratory Syncytial Virus NOT DETECTED NOT DETECTED Final   Bordetella pertussis NOT DETECTED NOT DETECTED Final   Bordetella Parapertussis NOT DETECTED NOT DETECTED Final   Chlamydophila pneumoniae NOT DETECTED NOT DETECTED Final   Mycoplasma pneumoniae NOT DETECTED NOT DETECTED Final    Comment: Performed at [facility], [address], [place], [place] [address]  Expectorated Sputum Assessment w Gram Stain,  Rflx to Resp Cult     Status: None   Collection Time: 08/30/23  4:25 PM   Specimen: Sputum  Result Value Ref Range Status   Specimen Description SPUTUM  Final   Special Requests NONE  Final   Sputum evaluation   Final    THIS SPECIMEN IS ACCEPTABLE FOR SPUTUM CULTURE Performed at [facility], [address], [place], [place] [address]     Report Status 08/30/2023 FINAL  Final  Culture, Respiratory w Gram Stain     Status: None   Collection Time: 08/30/23  4:25 PM   Specimen: SPU  Result Value Ref Range Status   Specimen Description   Final    SPUTUM Performed at [facility], [address] [place], [place] [address]    Special Requests   Final    NONE Reflexed from [id number] Performed at [facility], [address] [place], [place] [address]    Gram Stain   Final    RARE SQUAMOUS EPITHELIAL CELLS PRESENT FEW WBC PRESENT,BOTH PMN AND MONONUCLEAR FEW GRAM POSITIVE COCCI IN PAIRS RARE GRAM POSITIVE COCCI IN CLUSTERS Performed at [facility], [address], [place], [place] [address]    Culture MODERATE STAPHYLOCOCCUS EPIDERMIDIS  Final   Report Status 09/02/2023 FINAL  Final   Organism ID, Bacteria STAPHYLOCOCCUS EPIDERMIDIS  Final      Susceptibility   Staphylococcus epidermidis - MIC*    CIPROFLOXACIN <=0.5 SENSITIVE Sensitive     ERYTHROMYCIN >=8 RESISTANT Resistant     GENTAMICIN 4 SENSITIVE Sensitive     OXACILLIN >=4 RESISTANT Resistant     TETRACYCLINE >=16 RESISTANT Resistant     VANCOMYCIN 1 SENSITIVE Sensitive     TRIMETH/SULFA 80 RESISTANT Resistant     CLINDAMYCIN >=8 RESISTANT Resistant     RIFAMPIN <=0.5 SENSITIVE Sensitive     Inducible Clindamycin NEGATIVE Sensitive     * MODERATE STAPHYLOCOCCUS EPIDERMIDIS  MRSA Next Gen by PCR, Nasal     Status: None   Collection Time: 09/01/23  2:51 AM   Specimen: Nasal Mucosa; Nasal Swab  Result Value Ref Range Status   MRSA by PCR Next Gen NOT DETECTED NOT DETECTED Final    Comment: (NOTE) The GeneXpert MRSA Assay (FDA approved for NASAL specimens only), is one component of a comprehensive MRSA colonization surveillance program. It is not intended to diagnose MRSA infection nor to guide or monitor treatment for MRSA infections. Test performance is not FDA approved in patients less than 51 years old. Performed at [facility], [address] [place], [place] [address]      Total time spend on discharging this patient, including the last patient exam, discussing the hospital stay, instructions for ongoing care as it relates to all pertinent caregivers, as well as preparing the medical discharge records, prescriptions, and/or referrals as applicable, is 35 minutes.    Darlin Priestly, MD  [facility] 09/06/2023, 8:58 AM

## 2023-09-06 NOTE — TOC Transition Note (Addendum)
Transition of Care [facility]) - Discharge Note   Patient Details  Name: Kurt Aguilar MRN: [id number] Date of Birth: February 16, 1946  [facility]) CM/SW Contact:  Liliana Cline, LCSW Phone Number: 09/06/2023, 12:12 PM   Clinical Narrative:    Discharge to [facility] today. Room 116. Confirmed with Admissions Worker Sue Lush. Updated MD, RN, and wife [name]). Asked RN to call report. EMS paperwork completed. Patient is next on list for [facility].   3:20- Patient has not been picked up yet. Called [facility], they stated they don't have an ETA but patient is still next on the list. RN updated.   Final next level of care: Skilled Nursing Facility Barriers to Discharge: Barriers Resolved   Patient Goals and CMS Choice Patient states their goals for this hospitalization and ongoing recovery are:: [facility] CMS Medicare.gov Compare Post Acute Care list provided to:: Patient Represenative (must comment) Choice offered to / list presented to : Spouse      Discharge Placement              Patient chooses bed at: [facility] Patient to be transferred to facility by: [facility]   Patient and family notified of of transfer: 09/06/23  Discharge Plan and Services Additional resources added to the After Visit Summary for                                       Social Drivers of Health (SDOH) Interventions SDOH Screenings   Food Insecurity: No Food Insecurity (09/01/2023)  Housing: Low Risk  (09/01/2023)  Transportation Needs: No Transportation Needs (09/01/2023)  Utilities: Not At Risk (09/01/2023)  Alcohol Screen: Low Risk  (04/30/2023)  Depression (PHQ2-9): Low Risk  (04/30/2023)  Financial Resource Strain: Low Risk  (04/30/2023)  Physical Activity: Insufficiently Active (04/30/2023)  Social Connections: Socially Integrated (04/30/2023)  Stress: No Stress Concern Present (04/30/2023)  Tobacco Use: Medium Risk (09/05/2023)  Health Literacy: Adequate Health Literacy  (04/30/2023)     Readmission Risk Interventions    09/20/2021    9:36 AM  Readmission Risk Prevention Plan  Transportation Screening Complete  Medication Review (RN Care Manager) Referral to Pharmacy  PCP or Specialist appointment within 3-5 days of discharge Not Complete  PCP/Specialist Appt Not Complete comments pending patient is not medically stable but does have a PCP  HRI or Home Care Consult Complete  SW Recovery Care/Counseling Consult Complete  Palliative Care Screening Not Applicable  Skilled Nursing Facility Not Applicable

## 2023-09-06 NOTE — Progress Notes (Signed)
[facility]  PROGRESS NOTE   Subjective:   Seen at bedside.  Feels much better.  Objective:  Vital signs: Blood pressure 110/73, pulse (!) 110, temperature 98 F (36.7 C), resp. rate 18, height 5\' 7"  (1.702 m), weight 108.5 kg, SpO2 100%.  Intake/Output Summary (Last 24 hours) at 09/06/2023 1422 Last data filed at 09/05/2023 2028 Gross per 24 hour  Intake 220 ml  Output 700 ml  Net -480 ml   Filed Weights   09/01/23 0229  Weight: 108.5 kg     Physical Exam: General:  No acute distress  Head:  Normocephalic, atraumatic. Moist oral mucosal membranes  Eyes:  Anicteric  Neck:  Supple  Lungs:   Clear to auscultation, normal effort  Heart:  S1S2 no rubs  Abdomen:   Soft, nontender, bowel sounds present  Extremities:  peripheral edema.  Neurologic:  Awake, alert, following commands  Skin:  No lesions  Access:     Basic Metabolic Panel: Recent Labs  Lab 09/01/23 0537 09/02/23 0435 09/02/23 1614 09/03/23 0516 09/04/23 0456 09/05/23 0550  NA 137 139  --  140 139 139  K 3.8 4.0  --  3.8 4.2 4.4  CL 97* 100  --  101 102 104  CO2 24 25  --  25 22 23   GLUCOSE 225* 218* 428* 156* 199* 178*  BUN 86* 92*  --  88* 85* 78*  CREATININE 3.83* 3.76*  --  3.70* 3.45* 3.35*  CALCIUM 8.4* 8.5*  --  8.8* 8.9 8.7*  MG 2.2 2.3  --   --   --   --    GFR: Estimated Creatinine Clearance: 21 mL/min (A) (by C-G formula based on SCr of 3.35 mg/dL (H)).  Liver Function Tests: Recent Labs  Lab 08/31/23 0144 09/05/23 0546  AST 140* 32  ALT 284* 84*  ALKPHOS 145* 78  BILITOT 0.8 1.2*  PROT 6.1* 5.4*  ALBUMIN 2.6* 3.0*   No results for input(s): "LIPASE", "AMYLASE" in the last 168 hours. No results for input(s): "AMMONIA" in the last 168 hours.  CBC: Recent Labs  Lab 08/31/23 0144 09/01/23 0537 09/02/23 0435 09/03/23 0516 09/04/23 0456 09/05/23 0550  WBC 12.7* 14.0* 13.7* 15.2* 11.8* 10.6*  NEUTROABS 10.2*  --   --   --   --   --   HGB 9.8* 9.6* 8.7* 9.0*  8.6* 7.9*  HCT 30.3* 29.4* 26.8* 27.9* 26.6* 24.2*  MCV 100.3* 99.3 100.0 101.5* 100.0 101.3*  PLT 274 266 239 247 239 225     HbA1C: Hemoglobin A1C  Date/Time Value Ref Range Status  05/08/2023 11:11 AM 6.3 (A) 4.0 - 5.6 % Final  09/05/2022 01:26 PM 7.9 (A) 4.0 - 5.6 % Final   Hgb A1c MFr Bld  Date/Time Value Ref Range Status  05/17/2020 02:44 PM 6.6 (H) 4.8 - 5.6 % Final    Comment:    (NOTE) Pre diabetes:          5.7%-6.4%  Diabetes:              >6.4%  Glycemic control for   <7.0% adults with diabetes   03/24/2019 09:25 AM 6.3 4.6 - 6.5 % Final    Comment:    Glycemic Control Guidelines for People with Diabetes:Non Diabetic:  <6%Goal of Therapy: <7%Additional Action Suggested:  >8%     Urinalysis: No results for input(s): "COLORURINE", "LABSPEC", "PHURINE", "GLUCOSEU", "HGBUR", "BILIRUBINUR", "KETONESUR", "PROTEINUR", "UROBILINOGEN", "NITRITE", "LEUKOCYTESUR" in the last 72 hours.  Invalid input(s): "  APPERANCEUR"    Imaging: US Venous Img Lower Unilateral Right (DVT) Result Date: 09/05/2023 CLINICAL DATA:  Right leg pain, initial encounter EXAM: RIGHT LOWER EXTREMITY VENOUS DOPPLER ULTRASOUND TECHNIQUE: Gray-scale sonography with graded compression, as well as color Doppler and duplex ultrasound were performed to evaluate the lower extremity deep venous systems from the level of the common femoral vein and including the common femoral, femoral, profunda femoral, popliteal and calf veins including the posterior tibial, peroneal and gastrocnemius veins when visible. The superficial great saphenous vein was also interrogated. Spectral Doppler was utilized to evaluate flow at rest and with distal augmentation maneuvers in the common femoral, femoral and popliteal veins. COMPARISON:  None Available. FINDINGS: Contralateral Common Femoral Vein: Respiratory phasicity is normal and symmetric with the symptomatic side. No evidence of thrombus. Normal compressibility. Common Femoral  Vein: No evidence of thrombus. Normal compressibility, respiratory phasicity and response to augmentation. Saphenofemoral Junction: No evidence of thrombus. Normal compressibility and flow on color Doppler imaging. Profunda Femoral Vein: No evidence of thrombus. Normal compressibility and flow on color Doppler imaging. Femoral Vein: No evidence of thrombus. Normal compressibility, respiratory phasicity and response to augmentation. Popliteal Vein: No evidence of thrombus. Normal compressibility, respiratory phasicity and response to augmentation. Calf Veins: No evidence of thrombus. Normal compressibility and flow on color Doppler imaging. Superficial Great Saphenous Vein: No evidence of thrombus. Normal compressibility. Venous Reflux:  None. Other Findings:  None. IMPRESSION: No evidence of deep venous thrombosis. Electronically Signed   By: Alcide Clever M.D.   On: 09/05/2023 19:01     Medications:     aspirin EC  81 mg Oral Daily   cyanocobalamin  500 mcg Oral Daily   ezetimibe  10 mg Oral Daily   famotidine  20 mg Oral QHS   gabapentin  100 mg Oral QHS   heparin  5,000 Units Subcutaneous Q8H   insulin aspart  0-15 Units Subcutaneous TID WC   insulin aspart  5 Units Subcutaneous TID WC   insulin aspart protamine- aspart  30 Units Subcutaneous Q breakfast   ipratropium-albuterol  3 mL Nebulization BID   linezolid  600 mg Oral Q12H   methocarbamol  500 mg Oral QHS   predniSONE  40 mg Oral Q breakfast   QUEtiapine  100 mg Oral QHS   senna-docusate  1 tablet Oral QHS   sodium chloride flush  3 mL Intravenous Q12H   sodium chloride HYPERTONIC  4 mL Nebulization BID   torsemide  20 mg Oral Daily    Assessment/ Plan:     Kurt Aguilar is a 77 y.o. male with medical history significant of tracheobronchomalacia (previously diagnosed as BOOP) on chronic steroid therapy, CAD s/p CABG (1997), CKD stage IV, hypertension, hyperlipidemia, type 2 diabetes, OSA on CPAP, osteoporosis, who was  admitted for hypoxic respiratory failure and found to have pneumonia..    #1: Acute kidney injury on chronic kidney disease: Patient has a baseline creatinine of 2.2.  Acute kidney injury secondary to ATN.  Renal indices are slowly but steadily improving.  Patient has a history of polycystic kidney disease and diabetic kidney disease.   #2: Anemia: Anemia secondary to chronic kidney disease.   #3: Sepsis: Continue linezolid as ordered.   #4: Secondary hyperparathyroidism: PTH is slightly elevated but calcium and phosphorus levels are within normal limits.   #5: Diabetes: Continue insulin as ordered. Discharge planning in progress. Labs and medications reviewed. Will continue to follow along with you.   LOS: 7 Smith Mcnicholas  Suezanne Jacquet, MD [facility] 12/29/20242:22 PM

## 2023-09-06 NOTE — Discharge Instructions (Signed)
Follow up with PCP and pick up medications from pharmacy. Make sure to take all antibiotics. Do not double up on narcotic/pain medication or drink alcohol during this time. Do not drive while taking narcotic medication. Call 911 or return to ER for life threatening issues or other concerns.

## 2023-09-06 NOTE — Consult Note (Signed)
WOC Nurse Consult Note: Reason for Consult: Consult requested for right arm skin tears.  Performed remotely after review of progress notes. These can be treated independently by the bedside nurses using the Skin care order set in Epic as follows: Apply Xeroform gauze to right arm skin tears Q day, then cover with ABD pad and kerlex. .Please re-consult if further assistance is needed.  Thank-you,  Cammie Mcgee MSN, RN, CWOCN, [place], CNS [phone]

## 2023-09-07 ENCOUNTER — Ambulatory Visit: Payer: PPO | Admitting: Internal Medicine

## 2023-09-07 ENCOUNTER — Encounter: Payer: Self-pay | Admitting: Adult Health

## 2023-09-07 ENCOUNTER — Non-Acute Institutional Stay (SKILLED_NURSING_FACILITY): Payer: Self-pay | Admitting: Adult Health

## 2023-09-07 DIAGNOSIS — J9601 Acute respiratory failure with hypoxia: Secondary | ICD-10-CM | POA: Diagnosis not present

## 2023-09-07 DIAGNOSIS — J189 Pneumonia, unspecified organism: Secondary | ICD-10-CM | POA: Diagnosis not present

## 2023-09-07 DIAGNOSIS — J398 Other specified diseases of upper respiratory tract: Secondary | ICD-10-CM | POA: Diagnosis not present

## 2023-09-07 DIAGNOSIS — G8929 Other chronic pain: Secondary | ICD-10-CM | POA: Diagnosis not present

## 2023-09-07 DIAGNOSIS — G4733 Obstructive sleep apnea (adult) (pediatric): Secondary | ICD-10-CM

## 2023-09-07 DIAGNOSIS — E1121 Type 2 diabetes mellitus with diabetic nephropathy: Secondary | ICD-10-CM | POA: Diagnosis not present

## 2023-09-07 DIAGNOSIS — I2510 Atherosclerotic heart disease of native coronary artery without angina pectoris: Secondary | ICD-10-CM

## 2023-09-07 DIAGNOSIS — N179 Acute kidney failure, unspecified: Secondary | ICD-10-CM | POA: Diagnosis not present

## 2023-09-07 DIAGNOSIS — N189 Chronic kidney disease, unspecified: Secondary | ICD-10-CM

## 2023-09-07 DIAGNOSIS — I5032 Chronic diastolic (congestive) heart failure: Secondary | ICD-10-CM

## 2023-09-07 DIAGNOSIS — M545 Low back pain, unspecified: Secondary | ICD-10-CM

## 2023-09-07 LAB — FUNGITELL BETA-D-GLUCAN
Fungitell Value:: <31.25 pg/mL
Result Name:: NEGATIVE

## 2023-09-10 ENCOUNTER — Encounter: Payer: Self-pay | Admitting: Nurse Practitioner

## 2023-09-10 ENCOUNTER — Non-Acute Institutional Stay (SKILLED_NURSING_FACILITY): Payer: PPO | Admitting: Nurse Practitioner

## 2023-09-10 DIAGNOSIS — G8929 Other chronic pain: Secondary | ICD-10-CM

## 2023-09-10 DIAGNOSIS — E1121 Type 2 diabetes mellitus with diabetic nephropathy: Secondary | ICD-10-CM

## 2023-09-10 DIAGNOSIS — M545 Low back pain, unspecified: Secondary | ICD-10-CM

## 2023-09-10 DIAGNOSIS — N184 Chronic kidney disease, stage 4 (severe): Secondary | ICD-10-CM

## 2023-09-10 MED ORDER — HYDROCODONE-ACETAMINOPHEN 5-325 MG PO TABS: 1.0000 | ORAL_TABLET | Freq: Four times a day (QID) | ORAL | 0 refills | Status: DC | PRN

## 2023-09-10 NOTE — Progress Notes (Signed)
 Location:  Other Nursing Home Room [facility] Place of Service:  SNF (31)  Tower, Laine LABOR, MD  Patient Care Team: Tower, Laine LABOR, MD as PCP - General Perla, Evalene PARAS, MD as PCP - Cardiology (Cardiology) Perla, Evalene PARAS, MD as Consulting Physician (Cardiology) Darlean Ozell NOVAK, MD as Consulting Physician (Pulmonary Disease) Arloa Larnell POUR., MD as Referring Physician (Dentistry) Fate Morna SAILOR, [facility] (Inactive) as Pharmacist (Pharmacist)  Extended Emergency Contact Information Primary Emergency Contact: [facility] Address: [address]          [place], [place] [address] [place] Home Phone: [phone] Mobile Phone: [phone] Relation: Spouse  Goals of care: Advanced Directive information    09/10/2023   12:14 PM  Advanced Directives  Does Patient Have a Medical Advance Directive? Yes  Type of Estate Agent of [place];Out of facility DNR (pink MOST or yellow form)  Does patient want to make changes to medical advance directive? No - Patient declined  Copy of Healthcare Power of Attorney in Chart? Yes - validated most recent copy scanned in chart (See row information)     Chief Complaint  Patient presents with   Acute Visit    Pain    HPI:  Pt is a 78 y.o. male seen today for an acute visit for severe back pain, not controlled on current regimen. Had a fall 5 days ago. Lumbar spine xray negative for fracture No numbness or tingling noted No loss of browel or bladder control  Having frequent muscle spasms.  Pain 10/10 Unable to move in bed Can not get out of bed for therapy  Has a hard time moving due to pain.  No constipation Has bruising on leg and abdomen since hospitalization.    Past Medical History:  Diagnosis Date   Arthritis    Chronic kidney disease    Complication of anesthesia    constipation and inability to  urinate happened a few days after cataract surgery    Diabetes (HCC)    type  2    Diverticulitis    GERD (gastroesophageal reflux disease)    Gout    Heart attack (HCC)    Heart disease    Heart murmur    Hyperglycemia    Hyperlipidemia    Hypertension    Osteoporosis    Pneumonia    hx of BOOP followed by Dr Darlean    Sleep apnea    cpapp - setting at 17    Past Surgical History:  Procedure Laterality Date   ACROMIO-CLAVICULAR JOINT REPAIR Left 05/05/2019   Procedure: SHOULDER ARTHROSCOPIC ASSISTED ACROMIO-CLAVICULAR JOINT REPAIR;  Surgeon: Dozier Soulier, MD;  Location: [facility];  Service: Orthopedics;  Laterality: Left;   CATARACT EXTRACTION Bilateral    CORONARY ARTERY BYPASS GRAFT     [place]    LEFT HEART CATH AND CORS/GRAFTS ANGIOGRAPHY N/A 09/19/2021   Procedure: LEFT HEART CATH AND CORS/GRAFTS ANGIOGRAPHY;  Surgeon: Claudene Victory ORN, MD;  Location: [facility];  Service: Cardiovascular;  Laterality: N/A;   open heart surgery  09/09/1995-1998   5 bypass   REPAIR KNEE LIGAMENT     right   SHOULDER ARTHROSCOPY Left 05/05/2019   Procedure: ARTHROSCOPY SHOULDER;  Surgeon: Dozier Soulier, MD;  Location: [facility];  Service: Orthopedics;  Laterality: Left;   TONSILLECTOMY     TOTAL KNEE ARTHROPLASTY Left 05/29/2020   Procedure: LEFT TOTAL KNEE ARTHROPLASTY;  Surgeon: Sheril Coy, MD;  Location: [facility];  Service: Orthopedics;  Laterality: Left;    Allergies  Allergen Reactions   Oxycodone -Acetaminophen  Other (See Comments)    Stops breathing; has tolerated Tylenol  before   Oxycodone -Acetaminophen  Anaphylaxis    Outpatient Encounter Medications as of 09/10/2023  Medication Sig   acetaminophen  (TYLENOL ) 500 MG tablet Take 1,000 mg by mouth every 8 (eight) hours as needed.   albuterol  (PROVENTIL ) (2.5 MG/3ML) 0.083% nebulizer solution Take 3 mLs (2.5 mg total) by nebulization 2 (two) times daily.   allopurinol  (ZYLOPRIM ) 100 MG tablet Take 50 mg by mouth daily. Every 2 days.   aspirin  EC (ASPIRIN  81) 81 MG tablet Take 81 mg by mouth  daily. Swallow whole.   bisoprolol  (ZEBETA ) 5 MG tablet Take 0.5 tablets (2.5 mg total) by mouth daily.   Calcium  Carbonate-Vit D-Min (CALCIUM -VITAMIN D -MINERALS) 600-800 MG-UNIT CHEW Chew 1 tablet by mouth daily.   Continuous Blood Gluc Receiver (FREESTYLE LIBRE 2 READER) DEVI USE AS INSTRUCTED TO CHECK BLOOD SUGARS.   Continuous Blood Gluc Sensor (FREESTYLE LIBRE 2 SENSOR) MISC 1 Device by Does not apply route every 14 (fourteen) days.   cyanocobalamin  (VITAMIN B12) 500 MCG tablet Take 500 mcg by mouth daily.   ezetimibe  (ZETIA ) 10 MG tablet TAKE 1 TABLET BY MOUTH EVERY DAY   famotidine  (PEPCID ) 20 MG tablet TAKE 1 TABLET BY MOUTH EVERYDAY AT BEDTIME   gabapentin  (NEURONTIN ) 100 MG capsule Take 1 capsule (100 mg total) by mouth daily. Reduced from 300 mg.   Glucosamine HCl (GLUCOSAMINE PO) Take 1,500 mg by mouth 2 (two) times daily.    glucose blood (FREESTYLE LITE) test strip 1 each by Other route 4 (four) times daily. E11.65   insulin  isophane & regular human KwikPen (NOVOLIN  70/30 KWIKPEN) (70-30) 100 UNIT/ML KwikPen Inject 30 Units into the skin daily with breakfast. And pen needles 1/day   insulin  lispro (HUMALOG  KWIKPEN) 200 UNIT/ML KwikPen Inject 10 Units into the skin 2 (two) times daily before a meal. Inject before lunch and dinner.   Insulin  Pen Needle (BD PEN NEEDLE NANO 2ND GEN) 32G X 4 MM MISC USE 3 TIMES A DAY   ketoconazole (NIZORAL) 2 % cream Apply 1 Application topically 2 (two) times daily.   Lancets (FREESTYLE) lancets 1 EACH BY OTHER ROUTE 4 (FOUR) TIMES DAILY. E11.65   linezolid  (ZYVOX ) 600 MG tablet Take 1 tablet (600 mg total) by mouth every 12 (twelve) hours for 10 days.   Lutein 20 MG CAPS Take 20 mg by mouth daily.   meclizine  (ANTIVERT ) 12.5 MG tablet TAKE 1-2 TABLETS BY MOUTH THREE TIMES A DAY AS NEEDED FOR DIZZINESS   Menthol, Topical Analgesic, (BIOFREEZE COOL THE PAIN) 4 % GEL Apply to lower back topically four times daily   methocarbamol  (ROBAXIN ) 500 MG tablet  Take 1 tablet (500 mg total) by mouth at bedtime for 7 days.   Multiple Vitamins-Minerals (PRESERVISION AREDS 2 PO) Take 1 capsule by mouth daily.   nitroGLYCERIN  (NITROSTAT ) 0.4 MG SL tablet Place 1 tablet (0.4 mg total) under the tongue every 5 (five) minutes x 3 doses as needed for chest pain.   omeprazole  (PRILOSEC) 40 MG capsule TAKE 1 CAPUSLE BY MOUTH 30- 60 MIN BEFORE YOUR FIRST AND LAST MEALS OF THE DAY   ondansetron  (ZOFRAN ) 8 MG tablet Take 1 tablet (8 mg total) by mouth every 8 (eight) hours as needed for nausea or vomiting. From narcotic pain medicine   OXYGEN 2lpm every shift related to acute respiratory failure with hypoxia.   polyethylene glycol (MIRALAX  / GLYCOLAX ) 17  g packet Take 17 g by mouth daily.   predniSONE  (DELTASONE ) 20 MG tablet Starting on 09/07/23, take 30 mg prednisone  daily for 7 days, and decrease by 10mg  every 7 days until 10mg  and maintain until seen as outpatient by pulmonologist.   QUEtiapine  (SEROQUEL ) 100 MG tablet Take 1 tablet (100 mg total) by mouth at bedtime for 7 days.   rosuvastatin  (CRESTOR ) 40 MG tablet TAKE 1 TABLET BY MOUTH EVERY DAY   Semaglutide , 2 MG/DOSE, (OZEMPIC , 2 MG/DOSE,) 8 MG/3ML SOPN Inject 2 mg into the skin once a week.   senna-docusate (SENOKOT-S) 8.6-50 MG tablet Take 1 tablet by mouth at bedtime.   Torsemide  40 MG TABS Take 20 mg by mouth daily. Reduced from 40 mg.   [DISCONTINUED] allopurinol  (ZYLOPRIM ) 100 MG tablet TAKE 1 TABLET BY MOUTH TWICE A DAY   [DISCONTINUED] colchicine  0.6 MG tablet Take 1 tablet (0.6 mg total) by mouth as needed (GOUT FLARE). TAKE 1 TABLET BY MOUTH TWICE A DAY WITH A MEAL AS NEEDED FOR ACUTE FLARE OF GOUT (Patient not taking: Reported on 09/07/2023)   No facility-administered encounter medications on file as of 09/10/2023.    Review of Systems  Constitutional:  Positive for activity change. Negative for appetite change and fatigue.  Musculoskeletal:  Positive for arthralgias, back pain, gait problem and  myalgias. Negative for neck pain and neck stiffness.  Skin:  Positive for color change (increase in bruising).    Immunization History  Administered Date(s) Administered   Fluad Quad(high Dose 65+) 06/25/2019, 05/29/2022   Influenza Split 07/09/2011   Influenza Whole 05/14/2010, 06/09/2012   Influenza, High Dose Seasonal PF 05/22/2014, 07/04/2015, 05/16/2016, 07/09/2018, 05/20/2023   Influenza,inj,Quad PF,6+ Mos 07/11/2013   Influenza-Unspecified 07/03/2017, 06/14/2021   Moderna Sars-Covid-2 Vaccination 11/18/2019, 12/27/2019, 07/24/2020, 04/12/2021, 06/14/2021   Pfizer Covid-19 Vaccine Bivalent Booster 58yrs & up 06/16/2022   Pfizer(Comirnaty)Fall Seasonal Vaccine 12 years and older 05/20/2023   Pneumococcal Conjugate-13 08/07/2014   Pneumococcal Polysaccharide-23 09/08/2002, 07/11/2013   Respiratory Syncytial Virus Vaccine,Recomb Aduvanted(Arexvy) 06/16/2022   Td 09/08/2004, 02/12/2016   Zoster Recombinant(Shingrix) 04/06/2018, 06/17/2018   Zoster, Live 07/09/2011   Pertinent  Health Maintenance Due  Topic Date Due   HEMOGLOBIN A1C  11/06/2023   FOOT EXAM  05/07/2024   OPHTHALMOLOGY EXAM  08/04/2024   INFLUENZA VACCINE  Completed   Colonoscopy  Discontinued      08/18/2022    8:45 PM 08/19/2022    8:46 AM 10/09/2022    1:57 PM 04/22/2023    8:52 AM 04/30/2023    1:40 PM  Fall Risk  Falls in the past year?   0 0 1  Was there an injury with Fall?   1 0 0  Fall Risk Category Calculator   2 0 2  (RETIRED) Patient Fall Risk Level High fall risk High fall risk     Patient at Risk for Falls Due to   Other (Comment)  Impaired balance/gait;Impaired mobility  Patient at Risk for Falls Due to - Comments   Medication was off and made his passed out    Fall risk Follow up   Falls evaluation completed Falls evaluation completed Falls evaluation completed;Falls prevention discussed;Education provided   Functional Status Survey:    Vitals:   09/10/23 1154  BP: 106/70  Pulse: 97   Resp: 14  Temp: (!) 97.4 F (36.3 C)  SpO2: 92%  Weight: 234 lb (106.1 kg)  Height: 5' 10 (1.778 m)   Body mass index is 33.58 kg/m. Physical  Exam Constitutional:      Appearance: Normal appearance.  Musculoskeletal:        General: Tenderness present.     Right lower leg: No edema.     Left lower leg: No edema.     Comments: Limited movement due to pain guarded  Skin:    Findings: Bruising present.  Neurological:     Mental Status: He is alert. Mental status is at baseline.  Psychiatric:        Mood and Affect: Mood normal.     Labs reviewed: Recent Labs    08/28/23 0455 08/30/23 1328 09/01/23 0537 09/02/23 0435 09/02/23 1614 09/03/23 0516 09/04/23 0456 09/05/23 0550  NA 143   < > 137 139  --  140 139 139  K 4.6   < > 3.8 4.0  --  3.8 4.2 4.4  CL 101   < > 97* 100  --  101 102 104  CO2 27   < > 24 25  --  25 22 23   GLUCOSE 117*   < > 225* 218*   < > 156* 199* 178*  BUN 86*   < > 86* 92*  --  88* 85* 78*  CREATININE 3.73*   < > 3.83* 3.76*  --  3.70* 3.45* 3.35*  CALCIUM  9.0   < > 8.4* 8.5*  --  8.8* 8.9 8.7*  MG 2.9*  --  2.2 2.3  --   --   --   --    < > = values in this interval not displayed.   Recent Labs    08/30/23 1328 08/31/23 0144 09/05/23 0546  AST 257* 140* 32  ALT 403* 284* 84*  ALKPHOS 184* 145* 78  BILITOT 0.8 0.8 1.2*  PROT 7.1 6.1* 5.4*  ALBUMIN  3.0* 2.6* 3.0*   Recent Labs    08/20/23 1109 08/21/23 0327 08/30/23 1328 08/31/23 0144 09/01/23 0537 09/03/23 0516 09/04/23 0456 09/05/23 0550  WBC 17.0*   < > 13.5* 12.7*   < > 15.2* 11.8* 10.6*  NEUTROABS 11.2*  --  10.8* 10.2*  --   --   --   --   HGB 12.6*   < > 10.8* 9.8*   < > 9.0* 8.6* 7.9*  HCT 38.7*   < > 34.4* 30.3*   < > 27.9* 26.6* 24.2*  MCV 102.1*   < > 102.7* 100.3*   < > 101.5* 100.0 101.3*  PLT 164   < > 331 274   < > 247 239 225   < > = values in this interval not displayed.   Lab Results  Component Value Date   TSH 3.47 04/15/2023   Lab Results   Component Value Date   HGBA1C 6.3 (A) 05/08/2023   Lab Results  Component Value Date   CHOL 138 04/15/2023   HDL 47.60 04/15/2023   LDLCALC 33 02/14/2022   LDLDIRECT 63.0 04/15/2023   TRIG 320.0 (H) 04/15/2023   CHOLHDL 3 04/15/2023    Significant Diagnostic Results in last 30 days:  US  Venous Img Lower Unilateral Right (DVT) Result Date: 09/05/2023 CLINICAL DATA:  Right leg pain, initial encounter EXAM: RIGHT LOWER EXTREMITY VENOUS DOPPLER ULTRASOUND TECHNIQUE: Gray-scale sonography with graded compression, as well as color Doppler and duplex ultrasound were performed to evaluate the lower extremity deep venous systems from the level of the common femoral vein and including the common femoral, femoral, profunda femoral, popliteal and calf veins including the posterior tibial, peroneal and gastrocnemius veins  when visible. The superficial great saphenous vein was also interrogated. Spectral Doppler was utilized to evaluate flow at rest and with distal augmentation maneuvers in the common femoral, femoral and popliteal veins. COMPARISON:  None Available. FINDINGS: Contralateral Common Femoral Vein: Respiratory phasicity is normal and symmetric with the symptomatic side. No evidence of thrombus. Normal compressibility. Common Femoral Vein: No evidence of thrombus. Normal compressibility, respiratory phasicity and response to augmentation. Saphenofemoral Junction: No evidence of thrombus. Normal compressibility and flow on color Doppler imaging. Profunda Femoral Vein: No evidence of thrombus. Normal compressibility and flow on color Doppler imaging. Femoral Vein: No evidence of thrombus. Normal compressibility, respiratory phasicity and response to augmentation. Popliteal Vein: No evidence of thrombus. Normal compressibility, respiratory phasicity and response to augmentation. Calf Veins: No evidence of thrombus. Normal compressibility and flow on color Doppler imaging. Superficial Great Saphenous  Vein: No evidence of thrombus. Normal compressibility. Venous Reflux:  None. Other Findings:  None. IMPRESSION: No evidence of deep venous thrombosis. Electronically Signed   By: Oneil Devonshire M.D.   On: 09/05/2023 19:01   ECHOCARDIOGRAM COMPLETE Result Date: 08/31/2023    ECHOCARDIOGRAM REPORT   Patient Name:   TAAVI HOOSE [facility] Date of Exam: 08/31/2023 Medical Rec #:  [id number]        Height:       70.0 in Accession #:    [id number]       Weight:       258.6 lb Date of Birth:  [birth year]        BSA:          2.328 m Patient Age:    77 years         BP:           123/73 mmHg Patient Gender: M                HR:           86 bpm. Exam Location:  [facility] Procedure: 2D Echo, Color Doppler, Cardiac Doppler and Intracardiac            Opacification Agent Indications:     NSTEMI I21.4  History:         Patient has prior history of Echocardiogram examinations, most                  recent 02/05/2023. Previous Myocardial Infarction, Prior CABG;                  Risk Factors:Hypertension and Dyslipidemia.  Sonographer:     Christopher Furnace Referring Phys:  [id number] [place] BROOM Diagnosing Phys: Deatrice Cage MD IMPRESSIONS  1. Left ventricular ejection fraction, by estimation, is 55 to 60%. The left ventricle has normal function. The left ventricle has no regional wall motion abnormalities. There is mild left ventricular hypertrophy. Left ventricular diastolic parameters are indeterminate.  2. Right ventricular systolic function is normal. The right ventricular size is normal. There is normal pulmonary artery systolic pressure.  3. Left atrial size was moderately dilated.  4. The mitral valve is degenerative. No evidence of mitral valve regurgitation. Mild mitral stenosis. The mean mitral valve gradient is 4.0 mmHg. Severe mitral annular calcification.  5. The aortic valve is calcified. Aortic valve regurgitation is not visualized. Mild to moderate aortic valve stenosis. AV gradient does not seem to be accurate. Stenosis degree  is assesed visually. FINDINGS  Left Ventricle: Left ventricular ejection fraction, by estimation, is 55 to 60%. The left ventricle has normal function. The left  ventricle has no regional wall motion abnormalities. Definity  contrast agent was given IV to delineate the left ventricular  endocardial borders. The left ventricular internal cavity size was normal in size. There is mild left ventricular hypertrophy. Left ventricular diastolic parameters are indeterminate. Right Ventricle: The right ventricular size is normal. No increase in right ventricular wall thickness. Right ventricular systolic function is normal. There is normal pulmonary artery systolic pressure. The tricuspid regurgitant velocity is 2.42 m/s, and  with an assumed right atrial pressure of 5 mmHg, the estimated right ventricular systolic pressure is 28.4 mmHg. Left Atrium: Left atrial size was moderately dilated. Right Atrium: Right atrial size was normal in size. Pericardium: There is no evidence of pericardial effusion. Mitral Valve: The mitral valve is degenerative in appearance. Severe mitral annular calcification. No evidence of mitral valve regurgitation. Mild mitral valve stenosis. MV peak gradient, 11.6 mmHg. The mean mitral valve gradient is 4.0 mmHg. Tricuspid Valve: The tricuspid valve is normal in structure. Tricuspid valve regurgitation is mild . No evidence of tricuspid stenosis. Aortic Valve: The aortic valve is calcified. Aortic valve regurgitation is not visualized. Mild to moderate aortic stenosis is present. Aortic valve mean gradient measures 2.0 mmHg. Aortic valve peak gradient measures 3.0 mmHg. Aortic valve area, by VTI measures 3.47 cm. Pulmonic Valve: The pulmonic valve was normal in structure. Pulmonic valve regurgitation is not visualized. No evidence of pulmonic stenosis. Aorta: The aortic root is normal in size and structure. Venous: The inferior vena cava was not well visualized. IAS/Shunts: No atrial level shunt  detected by color flow Doppler.  LEFT VENTRICLE PLAX 2D LVIDd:         4.00 cm   Diastology LVIDs:         3.05 cm   LV e' medial:    5.33 cm/s LV PW:         1.10 cm   LV E/e' medial:  22.1 LV IVS:        1.40 cm   LV e' lateral:   7.29 cm/s LVOT diam:     2.00 cm   LV E/e' lateral: 16.2 LV SV:         53 LV SV Index:   23 LVOT Area:     3.14 cm  RIGHT VENTRICLE RV Basal diam:  3.60 cm RV Mid diam:    3.40 cm RV S prime:     9.90 cm/s TAPSE (M-mode): 1.8 cm LEFT ATRIUM             Index        RIGHT ATRIUM           Index LA diam:        4.90 cm 2.10 cm/m   RA Area:     19.50 cm LA Vol (A2C):   81.6 ml 35.05 ml/m  RA Volume:   49.40 ml  21.22 ml/m LA Vol (A4C):   67.5 ml 28.99 ml/m LA Biplane Vol: 73.7 ml 31.66 ml/m  AORTIC VALVE AV Area (Vmax):    3.04 cm AV Area (Vmean):   2.80 cm AV Area (VTI):     3.47 cm AV Vmax:           86.50 cm/s AV Vmean:          63.500 cm/s AV VTI:            0.152 m AV Peak Grad:      3.0 mmHg AV Mean Grad:      2.0  mmHg LVOT Vmax:         83.80 cm/s LVOT Vmean:        56.500 cm/s LVOT VTI:          0.168 m LVOT/AV VTI ratio: 1.11  AORTA Ao Root diam: 3.30 cm MITRAL VALVE                TRICUSPID VALVE MV Area (PHT): 2.68 cm     TR Peak grad:   23.4 mmHg MV Area VTI:   1.38 cm     TR Vmax:        242.00 cm/s MV Peak grad:  11.6 mmHg MV Mean grad:  4.0 mmHg     SHUNTS MV Vmax:       1.70 m/s     Systemic VTI:  0.17 m MV Vmean:      98.8 cm/s    Systemic Diam: 2.00 cm MV Decel Time: 283 msec MV E velocity: 118.00 cm/s MV A velocity: 140.00 cm/s MV E/A ratio:  0.84 Deatrice Cage MD Electronically signed by Deatrice Cage MD Signature Date/Time: 08/31/2023/3:20:37 PM    Final    NM Pulmonary Perfusion Result Date: 08/31/2023 CLINICAL DATA:  Worsening shortness of breath. EXAM: NUCLEAR MEDICINE PERFUSION LUNG SCAN TECHNIQUE: Perfusion images were obtained in multiple projections after intravenous injection of radiopharmaceutical. Ventilation scans intentionally deferred if  perfusion scan and chest x-ray adequate for interpretation during COVID 19 epidemic. RADIOPHARMACEUTICALS:  4.28 mCi Tc-20m MAA IV COMPARISON:  Chest CT and radiograph 08/30/2023 FINDINGS: Comparison chest radiograph demonstrates airspace opacities in the left lower lobe. Perfusion imaging demonstrates no segmental or subsegmental perfusion abnormalities. Perfusion defect in the left lower lobe corresponds to airspace opacity seen on prior imaging. IMPRESSION: Nonsegmental perfusion defect correlating to airspace opacities seen on prior imaging. Low probability of pulmonary embolism. Electronically Signed   By: Norman Gatlin M.D.   On: 08/31/2023 10:42   US  Abdomen Complete Result Date: 08/30/2023 CLINICAL DATA:  Acute kidney injury. Elevated liver function studies. EXAM: ABDOMEN ULTRASOUND COMPLETE COMPARISON:  CT abdomen and pelvis 08/17/2022 FINDINGS: Gallbladder: No gallstones or wall thickening visualized. No sonographic Murphy sign noted by sonographer. Common bile duct: Diameter: 3 mm, normal Liver: Diffusely increased hepatic parenchymal echotexture suggesting fatty infiltration. Subcentimeter cysts. Portal vein is patent on color Doppler imaging with normal direction of blood flow towards the liver. IVC: No abnormality visualized. Pancreas: Visualized portion unremarkable. Spleen: Size and appearance within normal limits. Right Kidney: Length: 15.3 cm. Multiple parenchymal cysts are demonstrated with benign appearance. Largest measures 6.1 cm maximal diameter. Changes are consistent with polycystic kidney. No hydronephrosis. No solid mass identified. No imaging follow-up is indicated. Left Kidney: Length: 12.5 cm. Multiple parenchymal cysts are demonstrated with benign appearance. Largest measures 4.8 cm in maximal diameter. Changes are consistent with polycystic kidney. No hydronephrosis or solid mass identified. No follow-up imaging is indicated. Abdominal aorta: No aneurysm visualized. Other  findings: Examination is somewhat technically limited due to body habitus, bowel gas, and limited mobility. IMPRESSION: 1. Diffuse fatty infiltration of the liver. 2. Polycystic kidneys without evidence of obstruction. Electronically Signed   By: Elsie Gravely M.D.   On: 08/30/2023 20:25   CT CHEST WO CONTRAST Result Date: 08/30/2023 CLINICAL DATA:  Recent history of pneumonia/sepsis. Productive cough. EXAM: CT CHEST WITHOUT CONTRAST TECHNIQUE: Multidetector CT imaging of the chest was performed following the standard protocol without IV contrast. RADIATION DOSE REDUCTION: This exam was performed according to the departmental dose-optimization program which includes  automated exposure control, adjustment of the mA and/or kV according to patient size and/or use of iterative reconstruction technique. COMPARISON:  CT chest dated August 22, 2023. FINDINGS: Cardiovascular: Normal heart size. No pericardial effusion. Atherosclerosis of the thoracic aorta and arch branch vessels. Multivessel coronary artery calcifications. Aortic valve and mitral annulus calcifications. Prior CABG. Mediastinum/Nodes: Redemonstration of multiple prominent mediastinal lymph nodes, similar to slightly decreased in size compared to the prior exam. Calcified mediastinal and right hilar granulomatous nodes. Secretions noted in the left mainstem bronchus. Thyroid  gland and esophagus demonstrate no significant abnormality. Lungs/Pleura: Similar small right and trace left pleural effusions. Calcified pleural plaques in the left hemithorax. Patchy bilateral multifocal peripheral predominant consolidation is again noted with increased consolidative changes at the right lung base and the superior aspect of the posterior left lower lobe. No pneumothorax. Upper Abdomen: No acute abnormality. Musculoskeletal: Intact median sternotomy wires. No suspicious osseous lesion. No acute osseous abnormality. IMPRESSION: 1. Multilobar pneumonia with  increased airspace consolidation at the right lung base and the posterior left lower lobe. 2. Similar small right and trace left pleural effusions. 3. Additional unchanged ancillary findings, as described above. Aortic Atherosclerosis (ICD10-I70.0). Electronically Signed   By: Harrietta Sherry M.D.   On: 08/30/2023 17:03   DG Chest 2 View Result Date: 08/30/2023 CLINICAL DATA:  Cough and dyspnea. Recent hospitalization for pneumonia/sepsis. EXAM: CHEST - 2 VIEW COMPARISON:  08/22/2023 FINDINGS: Lungs are hypoinflated demonstrate persistent opacification over the left base likely infection with associated small left pleural effusion. Possible small amount right pleural fluid. Calcified pleural plaques over the lateral left upper lung/apex. Mild stable cardiomegaly. Remainder of the exam is unchanged. IMPRESSION: 1. Persistent opacification over the left base likely infection with associated small left pleural effusion. Possible small amount right pleural fluid. 2. Stable cardiomegaly. Electronically Signed   By: Toribio Agreste M.D.   On: 08/30/2023 13:43   CT Chest High Resolution Result Date: 08/23/2023 CLINICAL DATA:  78 year old male with history of interstitial lung disease. Shortness of breath. EXAM: CT CHEST WITHOUT CONTRAST TECHNIQUE: Multidetector CT imaging of the chest was performed following the standard protocol without intravenous contrast. High resolution imaging of the lungs, as well as inspiratory and expiratory imaging, was performed. RADIATION DOSE REDUCTION: This exam was performed according to the departmental dose-optimization program which includes automated exposure control, adjustment of the mA and/or kV according to patient size and/or use of iterative reconstruction technique. COMPARISON:  Chest CTA 12/19/2015. FINDINGS: Cardiovascular: Heart size is normal. There is no significant pericardial fluid, thickening or pericardial calcification. There is aortic atherosclerosis, as well as  atherosclerosis of the great vessels of the mediastinum and the coronary arteries, including calcified atherosclerotic plaque in the left main, left anterior descending, left circumflex and right coronary arteries. Status post median sternotomy for CABG including LIMA to the LAD. Severe calcifications of the aortic valve and mitral annulus. Mediastinum/Nodes: Multiple prominent borderline enlarged mediastinal and hilar lymph nodes are incidentally noted. No definite pathologically enlarged lymph nodes are identified. Densely calcified subcarinal and right hilar lymph nodes are also incidentally noted. Esophagus is unremarkable in appearance. No axillary lymphadenopathy. Lungs/Pleura: Small right and trace left pleural effusions lying dependently. Calcified pleural plaques in the left hemithorax. No calcified pleural plaques in the right hemithorax. Patchy multifocal peripheral predominant airspace consolidation throughout the lungs bilaterally, most evident in the dependent portions of the right lower lobe and right upper lobe. High-resolution images also demonstrates some patchy areas of ground-glass attenuation, septal thickening and thickening  of the peribronchovascular interstitium, however, today's study is overall limited by motion and the presence of what appears to be acute airspace consolidation, limiting accurate assessment for underlying interstitial lung disease. Inspiratory and expiratory imaging demonstrates severe collapse of the trachea and mainstem bronchi during expiration indicative of tracheobronchomalacia. Upper Abdomen: Partially imaged low-attenuation lesion in the upper left retroperitoneum measuring at least 3 cm in diameter, incompletely characterized on today's noncontrast CT examination, but statistically likely an exophytic cyst in the upper pole of the left kidney (no imaging follow-up recommended). Diffuse low attenuation throughout the visualized hepatic parenchyma, indicative of a  background of hepatic steatosis. Musculoskeletal: Median sternotomy wires. There are no aggressive appearing lytic or blastic lesions noted in the visualized portions of the skeleton. IMPRESSION: 1. Limited examination for assessment of interstitial lung disease based on presence of what appears to be multilobar pneumonia, most severe in the right lower lobe. If there is persistent clinical concern for interstitial lung disease (which is likely present in the background on today's study), repeat high-resolution chest CT should be performed after resolution of the patient's acute illness on an outpatient basis. 2. Small right and trace left pleural effusions lying dependently. 3. Calcified pleural plaques in the left hemithorax suggesting prior left-sided empyema and/or left-sided thoracic trauma. No right-sided calcified pleural plaques are noted to indicate asbestos related pleural disease at this time. 4. Severe tracheobronchomalacia. 5. Aortic atherosclerosis, in addition to left main and three-vessel coronary artery disease. Status post median sternotomy for CABG including LIMA to the LAD. 6. There are calcifications of the aortic valve and mitral annulus. Echocardiographic correlation for evaluation of potential valvular dysfunction may be warranted if clinically indicated. 7. Additional incidental findings, as above. Aortic Atherosclerosis (ICD10-I70.0). Electronically Signed   By: Toribio Aye M.D.   On: 08/23/2023 06:23   DG Chest 1 View Result Date: 08/22/2023 CLINICAL DATA:  Tachypnea EXAM: CHEST  1 VIEW COMPARISON:  Chest x-ray 08/20/2023 FINDINGS: Cardiomediastinal silhouette is enlarged, unchanged. There is a stable small left pleural effusion with patchy left basilar opacities. Left-sided calcified pleural plaques are again noted. There is no pneumothorax or acute fracture. Sternotomy wires are present. IMPRESSION: 1. Stable small left pleural effusion with patchy left basilar opacities. 2.  Left-sided calcified pleural plaques. Electronically Signed   By: Greig Pique M.D.   On: 08/22/2023 19:08   DG Chest Port 1 View Result Date: 08/20/2023 CLINICAL DATA:  Questionable sepsis - evaluate for abnormality. EXAM: PORTABLE CHEST 1 VIEW COMPARISON:  06/19/2023. FINDINGS: Low lung volume. There is new left basilar opacity with probable trace left pleural effusion. There is minimal blunting of right lateral costophrenic angle as well, which may represent trace right pleural effusion. Bilateral lungs are otherwise clear. Redemonstration of left lateral pleural calcification, nonspecific but commonly seen with asbestos related disease. No pneumothorax on either side. No pulmonary edema. Stable cardio-mediastinal silhouette. No acute osseous abnormalities. The soft tissues are within normal limits. IMPRESSION: *New left basilar opacity, which may represent combination of atelectasis and/or pneumonia. There is associated trace left pleural effusion. Follow up to clearing is recommended. *Probable new trace right pleural effusion.  No pulmonary edema. Electronically Signed   By: Ree Molt M.D.   On: 08/20/2023 13:21    Assessment/Plan 1. Chronic midline low back pain without sciatica (Primary) 10/10 back pain, unable to move in bed without having severe muscle spasms and pain -pain after he had a fall out of bed -he has chronic low back pain but this  is acute on chronic and pain has not improved over the last 5 days -he had flare in April after fall 2024, reports hydrocodone - apap helped pain- currently pain is not controlled He also is having severe muscle spasm on robaxin  which is not effective.  He has taken zanaflex in the past which helped.  -will start zanaflex 2 mg BID x 3 days scheduled then to have PRN Will increase prednisone  to 60 mg for 3 days then decrease back to 40 mg taper  2. Type 2 diabetes mellitus with diabetic nephropathy, unspecified whether long term insulin  use  (HCC) -no hypoglycemia Will follow up a1c  3. CKD (chronic kidney disease) stage 4, GFR 15-29 ml/min (HCC) -Follow metabolic panel Avoid nephrotoxic meds (NSAIDS)   Annielee Jemmott K. Caro BODILY [facility] [phone]

## 2023-09-11 ENCOUNTER — Ambulatory Visit: Payer: PPO | Admitting: Cardiology

## 2023-09-12 ENCOUNTER — Other Ambulatory Visit: Payer: Self-pay | Admitting: Internal Medicine

## 2023-09-12 DIAGNOSIS — R918 Other nonspecific abnormal finding of lung field: Secondary | ICD-10-CM

## 2023-09-14 ENCOUNTER — Other Ambulatory Visit: Payer: Self-pay

## 2023-09-14 ENCOUNTER — Non-Acute Institutional Stay (SKILLED_NURSING_FACILITY): Payer: Self-pay | Admitting: Student

## 2023-09-14 ENCOUNTER — Encounter: Payer: Self-pay | Admitting: Student

## 2023-09-14 DIAGNOSIS — J398 Other specified diseases of upper respiratory tract: Secondary | ICD-10-CM | POA: Diagnosis not present

## 2023-09-14 DIAGNOSIS — I5033 Acute on chronic diastolic (congestive) heart failure: Secondary | ICD-10-CM

## 2023-09-14 DIAGNOSIS — G4733 Obstructive sleep apnea (adult) (pediatric): Secondary | ICD-10-CM | POA: Diagnosis not present

## 2023-09-14 DIAGNOSIS — I2510 Atherosclerotic heart disease of native coronary artery without angina pectoris: Secondary | ICD-10-CM | POA: Diagnosis not present

## 2023-09-14 DIAGNOSIS — I5032 Chronic diastolic (congestive) heart failure: Secondary | ICD-10-CM | POA: Diagnosis not present

## 2023-09-14 DIAGNOSIS — J189 Pneumonia, unspecified organism: Secondary | ICD-10-CM

## 2023-09-14 DIAGNOSIS — N179 Acute kidney failure, unspecified: Secondary | ICD-10-CM

## 2023-09-14 DIAGNOSIS — M545 Low back pain, unspecified: Secondary | ICD-10-CM

## 2023-09-14 DIAGNOSIS — G8929 Other chronic pain: Secondary | ICD-10-CM

## 2023-09-14 DIAGNOSIS — F1920 Other psychoactive substance dependence, uncomplicated: Secondary | ICD-10-CM | POA: Diagnosis not present

## 2023-09-14 DIAGNOSIS — J9601 Acute respiratory failure with hypoxia: Secondary | ICD-10-CM

## 2023-09-14 DIAGNOSIS — N184 Chronic kidney disease, stage 4 (severe): Secondary | ICD-10-CM

## 2023-09-14 DIAGNOSIS — E1121 Type 2 diabetes mellitus with diabetic nephropathy: Secondary | ICD-10-CM

## 2023-09-14 DIAGNOSIS — N189 Chronic kidney disease, unspecified: Secondary | ICD-10-CM

## 2023-09-14 MED ORDER — GABAPENTIN 100 MG PO CAPS: 300.0000 mg | ORAL_CAPSULE | Freq: Every day | ORAL | Status: DC

## 2023-09-14 NOTE — Progress Notes (Signed)
 Location:      Place of Service:    Provider:  Abdul Balls, Laine LABOR, MD  Patient Care Team: Tower, Laine LABOR, MD as PCP - General Perla, Evalene PARAS, MD as PCP - Cardiology (Cardiology) Perla Evalene PARAS, MD as Consulting Physician (Cardiology) Darlean Ozell NOVAK, MD as Consulting Physician (Pulmonary Disease) Arloa Larnell POUR., MD as Referring Physician (Dentistry) Fate Morna SAILOR, [facility] (Inactive) as Pharmacist (Pharmacist)  Extended Emergency Contact Information Primary Emergency Contact: [facility] Address: [address]          [place], [place] [address] [place] Home Phone: [phone] Mobile Phone: [phone] Relation: Spouse  Code Status:  DNR Goals of care: Advanced Directive information    09/10/2023   12:14 PM  Advanced Directives  Does Patient Have a Medical Advance Directive? Yes  Type of Estate Agent of [place];Out of facility DNR (pink MOST or yellow form)  Does patient want to make changes to medical advance directive? No - Patient declined  Copy of Healthcare Power of Attorney in Chart? Yes - validated most recent copy scanned in chart (See row information)     Chief Complaint  Patient presents with   Admission    HPI:  Pt is a 78 y.o. male seen today for an Admission to [facility].   His wife Inocente is at Bedside and aids with the history.   Discussed the use of AI scribe software for clinical note transcription with the patient, who gave verbal consent to proceed.  History of Present Illness         The patient, with a history of diabetes and bronchial issues, has been experiencing a series of health complications over the past few years. Recently, the patient was hospitalized due to a bronchial infection, initially suspected to be cardiac disease. The infection was resistant to initial antibiotic treatment, requiring multiple regimens and hospital readmissions. The patient's oxygen levels were notably poor  during this period, necessitating supplemental oxygen, which was not previously required at home.  The patient's blood sugar levels have been fluctuating, with recent readings ranging from 243 to 319. The patient's diabetes management regimen appears complex and may require simplification for better control.  The patient also reports severe back pain, which has been previously attributed to arthritis. The pain has intensified recently, with visible muscle spasms. The patient has experienced two falls since hospital admission, one of which resulted in a brief loss of consciousness. The patient has a history of orthostatic hypotension and typically uses a cane or rollator for mobility.  The patient's cognitive function has reportedly diminished recently, with episodes of confusion and disorientation. This change is suspected to be a side effect of the patient's numerous medications. The patient also reports difficulty sleeping, with unpleasant dreams, a new symptom for him.  The patient has been on prednisone  for what was previously called BOOP, now considered bronchomalacia and COPD, with the dosage adjusted based on the patient's perceived need. The patient's current regimen includes a variety of medications, including antibiotics, insulin , pain relievers, and others. The patient reports dehydration but struggles to drink sufficient fluids.  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 The patient's goal is to regain enough strength and health to return home independently. He remains DNR Status.    Past Medical History:  Diagnosis Date   Arthritis    Chronic kidney disease    Complication of anesthesia  constipation and inability to  urinate happened a few days after cataract surgery    Diabetes (HCC)    type 2    Diverticulitis    GERD (gastroesophageal reflux disease)    Gout    Heart attack (HCC)    Heart disease    Heart murmur     Hyperglycemia    Hyperlipidemia    Hypertension    Osteoporosis    Pneumonia    hx of BOOP followed by Dr Darlean    Sleep apnea    cpapp - setting at 17    Past Surgical History:  Procedure Laterality Date   ACROMIO-CLAVICULAR JOINT REPAIR Left 05/05/2019   Procedure: SHOULDER ARTHROSCOPIC ASSISTED ACROMIO-CLAVICULAR JOINT REPAIR;  Surgeon: Dozier Soulier, MD;  Location: [facility];  Service: Orthopedics;  Laterality: Left;   CATARACT EXTRACTION Bilateral    CORONARY ARTERY BYPASS GRAFT     [place]    LEFT HEART CATH AND CORS/GRAFTS ANGIOGRAPHY N/A 09/19/2021   Procedure: LEFT HEART CATH AND CORS/GRAFTS ANGIOGRAPHY;  Surgeon: Claudene Victory ORN, MD;  Location: [facility];  Service: Cardiovascular;  Laterality: N/A;   open heart surgery  09/09/1995-1998   5 bypass   REPAIR KNEE LIGAMENT     right   SHOULDER ARTHROSCOPY Left 05/05/2019   Procedure: ARTHROSCOPY SHOULDER;  Surgeon: Dozier Soulier, MD;  Location: [facility];  Service: Orthopedics;  Laterality: Left;   TONSILLECTOMY     TOTAL KNEE ARTHROPLASTY Left 05/29/2020   Procedure: LEFT TOTAL KNEE ARTHROPLASTY;  Surgeon: Sheril Coy, MD;  Location: [facility];  Service: Orthopedics;  Laterality: Left;    Allergies  Allergen Reactions   Oxycodone  Anaphylaxis    02/25/2022     Outpatient Encounter Medications as of 09/14/2023  Medication Sig   acetaminophen  (TYLENOL ) 500 MG tablet Take 1,000 mg by mouth every 8 (eight) hours as needed.   albuterol  (PROVENTIL ) (2.5 MG/3ML) 0.083% nebulizer solution Take 3 mLs (2.5 mg total) by nebulization 2 (two) times daily.   allopurinol  (ZYLOPRIM ) 100 MG tablet Take 50 mg by mouth daily. Every 2 days.   aspirin  EC (ASPIRIN  81) 81 MG tablet Take 81 mg by mouth daily. Swallow whole.   bisoprolol  (ZEBETA ) 5 MG tablet Take 0.5 tablets (2.5 mg total) by mouth daily.   Calcium  Carbonate-Vit D-Min (CALCIUM -VITAMIN D -MINERALS) 600-800 MG-UNIT CHEW Chew 1 tablet by mouth daily.   Continuous Blood  Gluc Receiver (FREESTYLE LIBRE 2 READER) DEVI USE AS INSTRUCTED TO CHECK BLOOD SUGARS.   Continuous Blood Gluc Sensor (FREESTYLE LIBRE 2 SENSOR) MISC 1 Device by Does not apply route every 14 (fourteen) days.   cyanocobalamin  (VITAMIN B12) 500 MCG tablet Take 500 mcg by mouth daily.   ezetimibe  (ZETIA ) 10 MG tablet TAKE 1 TABLET BY MOUTH EVERY DAY   famotidine  (PEPCID ) 20 MG tablet TAKE 1 TABLET BY MOUTH EVERYDAY AT BEDTIME   gabapentin  (NEURONTIN ) 100 MG capsule Take 1 capsule (100 mg total) by mouth daily. Reduced from 300 mg.   Glucosamine HCl (GLUCOSAMINE PO) Take 1,500 mg by mouth 2 (two) times daily.    glucose blood (FREESTYLE LITE) test strip 1 each by Other route 4 (four) times daily. E11.65   HYDROcodone -acetaminophen  (NORCO/VICODIN) 5-325 MG tablet Take 1 tablet by mouth every 6 (six) hours as needed for moderate pain (pain score 4-6) or severe pain (pain score 7-10).   insulin  isophane & regular human KwikPen (NOVOLIN  70/30 KWIKPEN) (70-30) 100 UNIT/ML KwikPen Inject 30 Units into the skin daily with breakfast. And  pen needles 1/day   insulin  lispro (HUMALOG  KWIKPEN) 200 UNIT/ML KwikPen Inject 10 Units into the skin 2 (two) times daily before a meal. Inject before lunch and dinner.   Insulin  Pen Needle (BD PEN NEEDLE NANO 2ND GEN) 32G X 4 MM MISC USE 3 TIMES A DAY   ketoconazole (NIZORAL) 2 % cream Apply 1 Application topically 2 (two) times daily.   Lancets (FREESTYLE) lancets 1 EACH BY OTHER ROUTE 4 (FOUR) TIMES DAILY. E11.65   linezolid  (ZYVOX ) 600 MG tablet Take 1 tablet (600 mg total) by mouth every 12 (twelve) hours for 10 days.   Lutein 20 MG CAPS Take 20 mg by mouth daily.   meclizine  (ANTIVERT ) 12.5 MG tablet TAKE 1-2 TABLETS BY MOUTH THREE TIMES A DAY AS NEEDED FOR DIZZINESS   Menthol, Topical Analgesic, (BIOFREEZE COOL THE PAIN) 4 % GEL Apply to lower back topically four times daily   Multiple Vitamins-Minerals (PRESERVISION AREDS 2 PO) Take 1 capsule by mouth daily.    nitroGLYCERIN  (NITROSTAT ) 0.4 MG SL tablet Place 1 tablet (0.4 mg total) under the tongue every 5 (five) minutes x 3 doses as needed for chest pain.   omeprazole  (PRILOSEC) 40 MG capsule TAKE 1 CAPUSLE BY MOUTH 30- 60 MIN BEFORE YOUR FIRST AND LAST MEALS OF THE DAY   ondansetron  (ZOFRAN ) 8 MG tablet Take 1 tablet (8 mg total) by mouth every 8 (eight) hours as needed for nausea or vomiting. From narcotic pain medicine   OXYGEN 2lpm every shift related to acute respiratory failure with hypoxia.   polyethylene glycol (MIRALAX  / GLYCOLAX ) 17 g packet Take 17 g by mouth daily.   predniSONE  (DELTASONE ) 20 MG tablet Starting on 09/07/23, take 30 mg prednisone  daily for 7 days, and decrease by 10mg  every 7 days until 10mg  and maintain until seen as outpatient by pulmonologist.   QUEtiapine  (SEROQUEL ) 100 MG tablet Take 1 tablet (100 mg total) by mouth at bedtime for 7 days.   rosuvastatin  (CRESTOR ) 40 MG tablet TAKE 1 TABLET BY MOUTH EVERY DAY   Semaglutide , 2 MG/DOSE, (OZEMPIC , 2 MG/DOSE,) 8 MG/3ML SOPN Inject 2 mg into the skin once a week.   senna-docusate (SENOKOT-S) 8.6-50 MG tablet Take 1 tablet by mouth at bedtime.   Torsemide  40 MG TABS Take 20 mg by mouth daily. Reduced from 40 mg.   No facility-administered encounter medications on file as of 09/14/2023.    Review of Systems  Immunization History  Administered Date(s) Administered   Fluad Quad(high Dose 65+) 06/25/2019, 05/29/2022   Influenza Split 07/09/2011   Influenza Whole 05/14/2010, 06/09/2012   Influenza, High Dose Seasonal PF 05/22/2014, 07/04/2015, 05/16/2016, 07/09/2018, 05/20/2023   Influenza,inj,Quad PF,6+ Mos 07/11/2013   Influenza-Unspecified 07/03/2017, 06/14/2021   Moderna Sars-Covid-2 Vaccination 11/18/2019, 12/27/2019, 07/24/2020, 04/12/2021, 06/14/2021   Pfizer Covid-19 Vaccine Bivalent Booster 56yrs & up 06/16/2022   Pfizer(Comirnaty)Fall Seasonal Vaccine 12 years and older 05/20/2023   Pneumococcal Conjugate-13  08/07/2014   Pneumococcal Polysaccharide-23 09/08/2002, 07/11/2013   Respiratory Syncytial Virus Vaccine,Recomb Aduvanted(Arexvy) 06/16/2022   Td 09/08/2004, 02/12/2016   Zoster Recombinant(Shingrix) 04/06/2018, 06/17/2018   Zoster, Live 07/09/2011   Pertinent  Health Maintenance Due  Topic Date Due   HEMOGLOBIN A1C  11/06/2023   FOOT EXAM  05/07/2024   OPHTHALMOLOGY EXAM  08/04/2024   INFLUENZA VACCINE  Completed   Colonoscopy  Discontinued      08/18/2022    8:45 PM 08/19/2022    8:46 AM 10/09/2022    1:57 PM 04/22/2023  8:52 AM 04/30/2023    1:40 PM  Fall Risk  Falls in the past year?   0 0 1  Was there an injury with Fall?   1 0 0  Fall Risk Category Calculator   2 0 2  (RETIRED) Patient Fall Risk Level High fall risk High fall risk     Patient at Risk for Falls Due to   Other (Comment)  Impaired balance/gait;Impaired mobility  Patient at Risk for Falls Due to - Comments   Medication was off and made his passed out    Fall risk Follow up   Falls evaluation completed Falls evaluation completed Falls evaluation completed;Falls prevention discussed;Education provided   Functional Status Survey:    Vitals:   09/14/23 1431  BP: 124/82  Pulse: 94  Resp: 16  Temp: (!) 97.4 F (36.3 C)  SpO2: 94%  Weight: 234 lb (106.1 kg)   Body mass index is 33.58 kg/m. Physical Exam General : Chronically ill-appearing. Wearing 2LNC CHEST: Clear lung sounds with slight crackles. CARDIOVASCULAR: Heart rate regular. EXTREMITIES: Leg swelling approximately 1+. SKIN: Bruising present. Labs reviewed: Recent Labs    08/28/23 0455 08/30/23 1328 09/01/23 0537 09/02/23 0435 09/02/23 1614 09/03/23 0516 09/04/23 0456 09/05/23 0550  NA 143   < > 137 139  --  140 139 139  K 4.6   < > 3.8 4.0  --  3.8 4.2 4.4  CL 101   < > 97* 100  --  101 102 104  CO2 27   < > 24 25  --  25 22 23   GLUCOSE 117*   < > 225* 218*   < > 156* 199* 178*  BUN 86*   < > 86* 92*  --  88* 85* 78*  CREATININE  3.73*   < > 3.83* 3.76*  --  3.70* 3.45* 3.35*  CALCIUM  9.0   < > 8.4* 8.5*  --  8.8* 8.9 8.7*  MG 2.9*  --  2.2 2.3  --   --   --   --    < > = values in this interval not displayed.   Recent Labs    08/30/23 1328 08/31/23 0144 09/05/23 0546  AST 257* 140* 32  ALT 403* 284* 84*  ALKPHOS 184* 145* 78  BILITOT 0.8 0.8 1.2*  PROT 7.1 6.1* 5.4*  ALBUMIN  3.0* 2.6* 3.0*   Recent Labs    08/20/23 1109 08/21/23 0327 08/30/23 1328 08/31/23 0144 09/01/23 0537 09/03/23 0516 09/04/23 0456 09/05/23 0550  WBC 17.0*   < > 13.5* 12.7*   < > 15.2* 11.8* 10.6*  NEUTROABS 11.2*  --  10.8* 10.2*  --   --   --   --   HGB 12.6*   < > 10.8* 9.8*   < > 9.0* 8.6* 7.9*  HCT 38.7*   < > 34.4* 30.3*   < > 27.9* 26.6* 24.2*  MCV 102.1*   < > 102.7* 100.3*   < > 101.5* 100.0 101.3*  PLT 164   < > 331 274   < > 247 239 225   < > = values in this interval not displayed.   Lab Results  Component Value Date   TSH 3.47 04/15/2023   Lab Results  Component Value Date   HGBA1C 6.3 (A) 05/08/2023   Lab Results  Component Value Date   CHOL 138 04/15/2023   HDL 47.60 04/15/2023   LDLCALC 33 02/14/2022   LDLDIRECT 63.0 04/15/2023   TRIG  320.0 (H) 04/15/2023   CHOLHDL 3 04/15/2023    Significant Diagnostic Results in last 30 days:  US  Venous Img Lower Unilateral Right (DVT) Result Date: 09/05/2023 CLINICAL DATA:  Right leg pain, initial encounter EXAM: RIGHT LOWER EXTREMITY VENOUS DOPPLER ULTRASOUND TECHNIQUE: Gray-scale sonography with graded compression, as well as color Doppler and duplex ultrasound were performed to evaluate the lower extremity deep venous systems from the level of the common femoral vein and including the common femoral, femoral, profunda femoral, popliteal and calf veins including the posterior tibial, peroneal and gastrocnemius veins when visible. The superficial great saphenous vein was also interrogated. Spectral Doppler was utilized to evaluate flow at rest and with distal  augmentation maneuvers in the common femoral, femoral and popliteal veins. COMPARISON:  None Available. FINDINGS: Contralateral Common Femoral Vein: Respiratory phasicity is normal and symmetric with the symptomatic side. No evidence of thrombus. Normal compressibility. Common Femoral Vein: No evidence of thrombus. Normal compressibility, respiratory phasicity and response to augmentation. Saphenofemoral Junction: No evidence of thrombus. Normal compressibility and flow on color Doppler imaging. Profunda Femoral Vein: No evidence of thrombus. Normal compressibility and flow on color Doppler imaging. Femoral Vein: No evidence of thrombus. Normal compressibility, respiratory phasicity and response to augmentation. Popliteal Vein: No evidence of thrombus. Normal compressibility, respiratory phasicity and response to augmentation. Calf Veins: No evidence of thrombus. Normal compressibility and flow on color Doppler imaging. Superficial Great Saphenous Vein: No evidence of thrombus. Normal compressibility. Venous Reflux:  None. Other Findings:  None. IMPRESSION: No evidence of deep venous thrombosis. Electronically Signed   By: Oneil Devonshire M.D.   On: 09/05/2023 19:01   ECHOCARDIOGRAM COMPLETE Result Date: 08/31/2023    ECHOCARDIOGRAM REPORT   Patient Name:   [facility] Date of Exam: 08/31/2023 Medical Rec #:  [id number]        Height:       70.0 in Accession #:    [id number]       Weight:       258.6 lb Date of Birth:  [birth year]        BSA:          2.328 m Patient Age:    77 years         BP:           123/73 mmHg Patient Gender: M                HR:           86 bpm. Exam Location:  [facility] Procedure: 2D Echo, Color Doppler, Cardiac Doppler and Intracardiac            Opacification Agent Indications:     NSTEMI I21.4  History:         Patient has prior history of Echocardiogram examinations, most                  recent 02/05/2023. Previous Myocardial Infarction, Prior CABG;                  Risk  Factors:Hypertension and Dyslipidemia.  Sonographer:     Christopher Furnace Referring Phys:  [id number] [place] BROOM Diagnosing Phys: Deatrice Cage MD IMPRESSIONS  1. Left ventricular ejection fraction, by estimation, is 55 to 60%. The left ventricle has normal function. The left ventricle has no regional wall motion abnormalities. There is mild left ventricular hypertrophy. Left ventricular diastolic parameters are indeterminate.  2. Right ventricular systolic function is normal. The right ventricular size is normal. There  is normal pulmonary artery systolic pressure.  3. Left atrial size was moderately dilated.  4. The mitral valve is degenerative. No evidence of mitral valve regurgitation. Mild mitral stenosis. The mean mitral valve gradient is 4.0 mmHg. Severe mitral annular calcification.  5. The aortic valve is calcified. Aortic valve regurgitation is not visualized. Mild to moderate aortic valve stenosis. AV gradient does not seem to be accurate. Stenosis degree is assesed visually. FINDINGS  Left Ventricle: Left ventricular ejection fraction, by estimation, is 55 to 60%. The left ventricle has normal function. The left ventricle has no regional wall motion abnormalities. Definity  contrast agent was given IV to delineate the left ventricular  endocardial borders. The left ventricular internal cavity size was normal in size. There is mild left ventricular hypertrophy. Left ventricular diastolic parameters are indeterminate. Right Ventricle: The right ventricular size is normal. No increase in right ventricular wall thickness. Right ventricular systolic function is normal. There is normal pulmonary artery systolic pressure. The tricuspid regurgitant velocity is 2.42 m/s, and  with an assumed right atrial pressure of 5 mmHg, the estimated right ventricular systolic pressure is 28.4 mmHg. Left Atrium: Left atrial size was moderately dilated. Right Atrium: Right atrial size was normal in size. Pericardium: There is no  evidence of pericardial effusion. Mitral Valve: The mitral valve is degenerative in appearance. Severe mitral annular calcification. No evidence of mitral valve regurgitation. Mild mitral valve stenosis. MV peak gradient, 11.6 mmHg. The mean mitral valve gradient is 4.0 mmHg. Tricuspid Valve: The tricuspid valve is normal in structure. Tricuspid valve regurgitation is mild . No evidence of tricuspid stenosis. Aortic Valve: The aortic valve is calcified. Aortic valve regurgitation is not visualized. Mild to moderate aortic stenosis is present. Aortic valve mean gradient measures 2.0 mmHg. Aortic valve peak gradient measures 3.0 mmHg. Aortic valve area, by VTI measures 3.47 cm. Pulmonic Valve: The pulmonic valve was normal in structure. Pulmonic valve regurgitation is not visualized. No evidence of pulmonic stenosis. Aorta: The aortic root is normal in size and structure. Venous: The inferior vena cava was not well visualized. IAS/Shunts: No atrial level shunt detected by color flow Doppler.  LEFT VENTRICLE PLAX 2D LVIDd:         4.00 cm   Diastology LVIDs:         3.05 cm   LV e' medial:    5.33 cm/s LV PW:         1.10 cm   LV E/e' medial:  22.1 LV IVS:        1.40 cm   LV e' lateral:   7.29 cm/s LVOT diam:     2.00 cm   LV E/e' lateral: 16.2 LV SV:         53 LV SV Index:   23 LVOT Area:     3.14 cm  RIGHT VENTRICLE RV Basal diam:  3.60 cm RV Mid diam:    3.40 cm RV S prime:     9.90 cm/s TAPSE (M-mode): 1.8 cm LEFT ATRIUM             Index        RIGHT ATRIUM           Index LA diam:        4.90 cm 2.10 cm/m   RA Area:     19.50 cm LA Vol (A2C):   81.6 ml 35.05 ml/m  RA Volume:   49.40 ml  21.22 ml/m LA Vol (A4C):   67.5 ml 28.99  ml/m LA Biplane Vol: 73.7 ml 31.66 ml/m  AORTIC VALVE AV Area (Vmax):    3.04 cm AV Area (Vmean):   2.80 cm AV Area (VTI):     3.47 cm AV Vmax:           86.50 cm/s AV Vmean:          63.500 cm/s AV VTI:            0.152 m AV Peak Grad:      3.0 mmHg AV Mean Grad:      2.0 mmHg  LVOT Vmax:         83.80 cm/s LVOT Vmean:        56.500 cm/s LVOT VTI:          0.168 m LVOT/AV VTI ratio: 1.11  AORTA Ao Root diam: 3.30 cm MITRAL VALVE                TRICUSPID VALVE MV Area (PHT): 2.68 cm     TR Peak grad:   23.4 mmHg MV Area VTI:   1.38 cm     TR Vmax:        242.00 cm/s MV Peak grad:  11.6 mmHg MV Mean grad:  4.0 mmHg     SHUNTS MV Vmax:       1.70 m/s     Systemic VTI:  0.17 m MV Vmean:      98.8 cm/s    Systemic Diam: 2.00 cm MV Decel Time: 283 msec MV E velocity: 118.00 cm/s MV A velocity: 140.00 cm/s MV E/A ratio:  0.84 Deatrice Cage MD Electronically signed by Deatrice Cage MD Signature Date/Time: 08/31/2023/3:20:37 PM    Final    NM Pulmonary Perfusion Result Date: 08/31/2023 CLINICAL DATA:  Worsening shortness of breath. EXAM: NUCLEAR MEDICINE PERFUSION LUNG SCAN TECHNIQUE: Perfusion images were obtained in multiple projections after intravenous injection of radiopharmaceutical. Ventilation scans intentionally deferred if perfusion scan and chest x-ray adequate for interpretation during COVID 19 epidemic. RADIOPHARMACEUTICALS:  4.28 mCi Tc-80m MAA IV COMPARISON:  Chest CT and radiograph 08/30/2023 FINDINGS: Comparison chest radiograph demonstrates airspace opacities in the left lower lobe. Perfusion imaging demonstrates no segmental or subsegmental perfusion abnormalities. Perfusion defect in the left lower lobe corresponds to airspace opacity seen on prior imaging. IMPRESSION: Nonsegmental perfusion defect correlating to airspace opacities seen on prior imaging. Low probability of pulmonary embolism. Electronically Signed   By: Norman Gatlin M.D.   On: 08/31/2023 10:42   US  Abdomen Complete Result Date: 08/30/2023 CLINICAL DATA:  Acute kidney injury. Elevated liver function studies. EXAM: ABDOMEN ULTRASOUND COMPLETE COMPARISON:  CT abdomen and pelvis 08/17/2022 FINDINGS: Gallbladder: No gallstones or wall thickening visualized. No sonographic Murphy sign noted by  sonographer. Common bile duct: Diameter: 3 mm, normal Liver: Diffusely increased hepatic parenchymal echotexture suggesting fatty infiltration. Subcentimeter cysts. Portal vein is patent on color Doppler imaging with normal direction of blood flow towards the liver. IVC: No abnormality visualized. Pancreas: Visualized portion unremarkable. Spleen: Size and appearance within normal limits. Right Kidney: Length: 15.3 cm. Multiple parenchymal cysts are demonstrated with benign appearance. Largest measures 6.1 cm maximal diameter. Changes are consistent with polycystic kidney. No hydronephrosis. No solid mass identified. No imaging follow-up is indicated. Left Kidney: Length: 12.5 cm. Multiple parenchymal cysts are demonstrated with benign appearance. Largest measures 4.8 cm in maximal diameter. Changes are consistent with polycystic kidney. No hydronephrosis or solid mass identified. No follow-up imaging is indicated. Abdominal aorta: No aneurysm visualized. Other findings: Examination  is somewhat technically limited due to body habitus, bowel gas, and limited mobility. IMPRESSION: 1. Diffuse fatty infiltration of the liver. 2. Polycystic kidneys without evidence of obstruction. Electronically Signed   By: Elsie Gravely M.D.   On: 08/30/2023 20:25   CT CHEST WO CONTRAST Result Date: 08/30/2023 CLINICAL DATA:  Recent history of pneumonia/sepsis. Productive cough. EXAM: CT CHEST WITHOUT CONTRAST TECHNIQUE: Multidetector CT imaging of the chest was performed following the standard protocol without IV contrast. RADIATION DOSE REDUCTION: This exam was performed according to the departmental dose-optimization program which includes automated exposure control, adjustment of the mA and/or kV according to patient size and/or use of iterative reconstruction technique. COMPARISON:  CT chest dated August 22, 2023. FINDINGS: Cardiovascular: Normal heart size. No pericardial effusion. Atherosclerosis of the thoracic aorta  and arch branch vessels. Multivessel coronary artery calcifications. Aortic valve and mitral annulus calcifications. Prior CABG. Mediastinum/Nodes: Redemonstration of multiple prominent mediastinal lymph nodes, similar to slightly decreased in size compared to the prior exam. Calcified mediastinal and right hilar granulomatous nodes. Secretions noted in the left mainstem bronchus. Thyroid  gland and esophagus demonstrate no significant abnormality. Lungs/Pleura: Similar small right and trace left pleural effusions. Calcified pleural plaques in the left hemithorax. Patchy bilateral multifocal peripheral predominant consolidation is again noted with increased consolidative changes at the right lung base and the superior aspect of the posterior left lower lobe. No pneumothorax. Upper Abdomen: No acute abnormality. Musculoskeletal: Intact median sternotomy wires. No suspicious osseous lesion. No acute osseous abnormality. IMPRESSION: 1. Multilobar pneumonia with increased airspace consolidation at the right lung base and the posterior left lower lobe. 2. Similar small right and trace left pleural effusions. 3. Additional unchanged ancillary findings, as described above. Aortic Atherosclerosis (ICD10-I70.0). Electronically Signed   By: Harrietta Sherry M.D.   On: 08/30/2023 17:03   DG Chest 2 View Result Date: 08/30/2023 CLINICAL DATA:  Cough and dyspnea. Recent hospitalization for pneumonia/sepsis. EXAM: CHEST - 2 VIEW COMPARISON:  08/22/2023 FINDINGS: Lungs are hypoinflated demonstrate persistent opacification over the left base likely infection with associated small left pleural effusion. Possible small amount right pleural fluid. Calcified pleural plaques over the lateral left upper lung/apex. Mild stable cardiomegaly. Remainder of the exam is unchanged. IMPRESSION: 1. Persistent opacification over the left base likely infection with associated small left pleural effusion. Possible small amount right pleural  fluid. 2. Stable cardiomegaly. Electronically Signed   By: Toribio Agreste M.D.   On: 08/30/2023 13:43   CT Chest High Resolution Result Date: 08/23/2023 CLINICAL DATA:  78 year old male with history of interstitial lung disease. Shortness of breath. EXAM: CT CHEST WITHOUT CONTRAST TECHNIQUE: Multidetector CT imaging of the chest was performed following the standard protocol without intravenous contrast. High resolution imaging of the lungs, as well as inspiratory and expiratory imaging, was performed. RADIATION DOSE REDUCTION: This exam was performed according to the departmental dose-optimization program which includes automated exposure control, adjustment of the mA and/or kV according to patient size and/or use of iterative reconstruction technique. COMPARISON:  Chest CTA 12/19/2015. FINDINGS: Cardiovascular: Heart size is normal. There is no significant pericardial fluid, thickening or pericardial calcification. There is aortic atherosclerosis, as well as atherosclerosis of the great vessels of the mediastinum and the coronary arteries, including calcified atherosclerotic plaque in the left main, left anterior descending, left circumflex and right coronary arteries. Status post median sternotomy for CABG including LIMA to the LAD. Severe calcifications of the aortic valve and mitral annulus. Mediastinum/Nodes: Multiple prominent borderline enlarged mediastinal and hilar lymph  nodes are incidentally noted. No definite pathologically enlarged lymph nodes are identified. Densely calcified subcarinal and right hilar lymph nodes are also incidentally noted. Esophagus is unremarkable in appearance. No axillary lymphadenopathy. Lungs/Pleura: Small right and trace left pleural effusions lying dependently. Calcified pleural plaques in the left hemithorax. No calcified pleural plaques in the right hemithorax. Patchy multifocal peripheral predominant airspace consolidation throughout the lungs bilaterally, most evident  in the dependent portions of the right lower lobe and right upper lobe. High-resolution images also demonstrates some patchy areas of ground-glass attenuation, septal thickening and thickening of the peribronchovascular interstitium, however, today's study is overall limited by motion and the presence of what appears to be acute airspace consolidation, limiting accurate assessment for underlying interstitial lung disease. Inspiratory and expiratory imaging demonstrates severe collapse of the trachea and mainstem bronchi during expiration indicative of tracheobronchomalacia. Upper Abdomen: Partially imaged low-attenuation lesion in the upper left retroperitoneum measuring at least 3 cm in diameter, incompletely characterized on today's noncontrast CT examination, but statistically likely an exophytic cyst in the upper pole of the left kidney (no imaging follow-up recommended). Diffuse low attenuation throughout the visualized hepatic parenchyma, indicative of a background of hepatic steatosis. Musculoskeletal: Median sternotomy wires. There are no aggressive appearing lytic or blastic lesions noted in the visualized portions of the skeleton. IMPRESSION: 1. Limited examination for assessment of interstitial lung disease based on presence of what appears to be multilobar pneumonia, most severe in the right lower lobe. If there is persistent clinical concern for interstitial lung disease (which is likely present in the background on today's study), repeat high-resolution chest CT should be performed after resolution of the patient's acute illness on an outpatient basis. 2. Small right and trace left pleural effusions lying dependently. 3. Calcified pleural plaques in the left hemithorax suggesting prior left-sided empyema and/or left-sided thoracic trauma. No right-sided calcified pleural plaques are noted to indicate asbestos related pleural disease at this time. 4. Severe tracheobronchomalacia. 5. Aortic  atherosclerosis, in addition to left main and three-vessel coronary artery disease. Status post median sternotomy for CABG including LIMA to the LAD. 6. There are calcifications of the aortic valve and mitral annulus. Echocardiographic correlation for evaluation of potential valvular dysfunction may be warranted if clinically indicated. 7. Additional incidental findings, as above. Aortic Atherosclerosis (ICD10-I70.0). Electronically Signed   By: Toribio Aye M.D.   On: 08/23/2023 06:23   DG Chest 1 View Result Date: 08/22/2023 CLINICAL DATA:  Tachypnea EXAM: CHEST  1 VIEW COMPARISON:  Chest x-ray 08/20/2023 FINDINGS: Cardiomediastinal silhouette is enlarged, unchanged. There is a stable small left pleural effusion with patchy left basilar opacities. Left-sided calcified pleural plaques are again noted. There is no pneumothorax or acute fracture. Sternotomy wires are present. IMPRESSION: 1. Stable small left pleural effusion with patchy left basilar opacities. 2. Left-sided calcified pleural plaques. Electronically Signed   By: Greig Pique M.D.   On: 08/22/2023 19:08   DG Chest Port 1 View Result Date: 08/20/2023 CLINICAL DATA:  Questionable sepsis - evaluate for abnormality. EXAM: PORTABLE CHEST 1 VIEW COMPARISON:  06/19/2023. FINDINGS: Low lung volume. There is new left basilar opacity with probable trace left pleural effusion. There is minimal blunting of right lateral costophrenic angle as well, which may represent trace right pleural effusion. Bilateral lungs are otherwise clear. Redemonstration of left lateral pleural calcification, nonspecific but commonly seen with asbestos related disease. No pneumothorax on either side. No pulmonary edema. Stable cardio-mediastinal silhouette. No acute osseous abnormalities. The soft tissues are within normal  limits. IMPRESSION: *New left basilar opacity, which may represent combination of atelectasis and/or pneumonia. There is associated trace left pleural  effusion. Follow up to clearing is recommended. *Probable new trace right pleural effusion.  No pulmonary edema. Electronically Signed   By: Ree Molt M.D.   On: 08/20/2023 13:21    Assessment/Plan HCAP (healthcare-associated pneumonia)  Acute hypoxic respiratory failure (HCC)  Tracheobronchomalacia  CKD (chronic kidney disease) stage 4, GFR 15-29 ml/min (HCC)  Type 2 diabetes mellitus with diabetic nephropathy, unspecified whether long term insulin  use (HCC)  Chronic midline low back pain without sciatica  Coronary artery disease involving native coronary artery of native heart without angina pectoris  Chronic diastolic heart failure (HCC)  Acute kidney injury superimposed on chronic kidney disease (HCC)  OSA (obstructive sleep apnea) Rhinovirus Pneumonia with superimposed possible bacterial pneumonia with MRSE Recently discharged from hospital after a 14-day course of antibiotics including 5 days of meropenem , azithromycin , 2 additional days of Rocephin , and 10 more days of linezolid . -Continue current antibiotic regimen as prescribed.  Uncontrolled Diabetes Recent blood glucose levels ranging from 243 to 319. Currently on Humalog  pen (10 units twice a day) and Novolin  (30 units a day). -Review diabetes management history and consider simplifying regimen.  Chronic Back Pain Severe pain, exacerbated by recent falls. History of arthritis. -Continue hydrocodone -acetaminophen  as needed for severe pain. -Consider potential for osteopenia-related spinal fractures due to chronic steroid use and monitor closely.  Orthostatic Hypotension CHF Recent falls and episodes of shaking in arms and legs. -Discontinue torsemide  due to potential for exacerbating dehydration and hypotension. -Monitor weights to ensure no fluid retention.  Sleep Disturbances/OSA Recent onset of poor sleep quality and bad dreams. -Review medication list for potential side effects causing sleep  disturbances.  Oxygen Requirement Currently on 2 liters of oxygen due to recent lung infections. -Continue current oxygen therapy. -Plan for another oxygen certification test before discharge.  General Health Maintenance -Continue current medications including prednisone  (30mg  daily), bisoprolol  (2.5mg  daily), gabapentin  (300mg  nightly), omeprazole  (twice daily), and Ozempic  as available. -Discontinue meclizine  due to potential for increasing confusion and risk of falls. -Consider discontinuing Novolin  if simplifying diabetes regimen. -Plan for physical therapy to continue post-discharge. -Follow-up in 3-4 weeks or sooner if any changes in condition.  Family/ staff Communication: nursing, [place]  Labs/tests ordered:  BMP in 1 week.   I spent greater than 50  minutes for the care of this patient in face to face time, chart review, clinical documentation, patient education. I spent an additional 16 minutes discussing goals of care and advanced care planning.

## 2023-09-16 LAB — COMPREHENSIVE METABOLIC PANEL
Albumin: 3.6 (ref 3.5–5.0)
Calcium: 7.9 — AB (ref 8.7–10.7)
Globulin: 2
eGFR: 10

## 2023-09-16 LAB — HEPATIC FUNCTION PANEL
ALT: 58 U/L — AB (ref 10–40)
AST: 51 — AB (ref 14–40)
Alkaline Phosphatase: 75 (ref 25–125)
Bilirubin, Total: 1.3

## 2023-09-16 LAB — CBC AND DIFFERENTIAL
HCT: 25 — AB (ref 41–53)
Hemoglobin: 7.9 — AB (ref 13.5–17.5)
Neutrophils Absolute: 6210
Platelets: 81 10*3/uL — AB (ref 150–400)
WBC: 7.5

## 2023-09-16 LAB — BASIC METABOLIC PANEL
BUN: 121 — AB (ref 4–21)
CO2: 20 (ref 13–22)
Chloride: 108 (ref 99–108)
Creatinine: 5.4 — AB (ref 0.6–1.3)
Glucose: 115
Potassium: 5 meq/L (ref 3.5–5.1)
Sodium: 142 (ref 137–147)

## 2023-09-16 LAB — HEMOGLOBIN A1C: Hemoglobin A1C: 8.2

## 2023-09-16 LAB — CBC: RBC: 2.42 — AB (ref 3.87–5.11)

## 2023-09-17 ENCOUNTER — Other Ambulatory Visit: Payer: Self-pay

## 2023-09-17 ENCOUNTER — Emergency Department: Payer: PPO

## 2023-09-17 ENCOUNTER — Inpatient Hospital Stay: Payer: PPO

## 2023-09-17 ENCOUNTER — Inpatient Hospital Stay
Admission: EM | Admit: 2023-09-17 | Discharge: 2023-10-10 | DRG: 682 | Disposition: E | Payer: PPO | Source: Skilled Nursing Facility | Attending: Internal Medicine | Admitting: Internal Medicine

## 2023-09-17 ENCOUNTER — Encounter: Payer: Self-pay | Admitting: Nurse Practitioner

## 2023-09-17 ENCOUNTER — Non-Acute Institutional Stay (SKILLED_NURSING_FACILITY): Payer: PPO | Admitting: Nurse Practitioner

## 2023-09-17 DIAGNOSIS — R253 Fasciculation: Secondary | ICD-10-CM | POA: Diagnosis present

## 2023-09-17 DIAGNOSIS — I5032 Chronic diastolic (congestive) heart failure: Secondary | ICD-10-CM | POA: Diagnosis not present

## 2023-09-17 DIAGNOSIS — I10 Essential (primary) hypertension: Secondary | ICD-10-CM | POA: Diagnosis not present

## 2023-09-17 DIAGNOSIS — G9340 Encephalopathy, unspecified: Secondary | ICD-10-CM | POA: Diagnosis not present

## 2023-09-17 DIAGNOSIS — Z1152 Encounter for screening for COVID-19: Secondary | ICD-10-CM

## 2023-09-17 DIAGNOSIS — I959 Hypotension, unspecified: Secondary | ICD-10-CM

## 2023-09-17 DIAGNOSIS — M109 Gout, unspecified: Secondary | ICD-10-CM | POA: Diagnosis present

## 2023-09-17 DIAGNOSIS — Z8616 Personal history of COVID-19: Secondary | ICD-10-CM

## 2023-09-17 DIAGNOSIS — Z9981 Dependence on supplemental oxygen: Secondary | ICD-10-CM

## 2023-09-17 DIAGNOSIS — E875 Hyperkalemia: Secondary | ICD-10-CM | POA: Diagnosis not present

## 2023-09-17 DIAGNOSIS — R6521 Severe sepsis with septic shock: Secondary | ICD-10-CM | POA: Diagnosis not present

## 2023-09-17 DIAGNOSIS — R570 Cardiogenic shock: Secondary | ICD-10-CM | POA: Diagnosis present

## 2023-09-17 DIAGNOSIS — M7989 Other specified soft tissue disorders: Secondary | ICD-10-CM | POA: Diagnosis not present

## 2023-09-17 DIAGNOSIS — R109 Unspecified abdominal pain: Secondary | ICD-10-CM | POA: Diagnosis not present

## 2023-09-17 DIAGNOSIS — Z515 Encounter for palliative care: Secondary | ICD-10-CM | POA: Diagnosis not present

## 2023-09-17 DIAGNOSIS — N183 Chronic kidney disease, stage 3 unspecified: Secondary | ICD-10-CM | POA: Diagnosis not present

## 2023-09-17 DIAGNOSIS — R7989 Other specified abnormal findings of blood chemistry: Secondary | ICD-10-CM | POA: Diagnosis not present

## 2023-09-17 DIAGNOSIS — G4733 Obstructive sleep apnea (adult) (pediatric): Secondary | ICD-10-CM | POA: Diagnosis present

## 2023-09-17 DIAGNOSIS — N139 Obstructive and reflux uropathy, unspecified: Secondary | ICD-10-CM | POA: Diagnosis present

## 2023-09-17 DIAGNOSIS — J189 Pneumonia, unspecified organism: Secondary | ICD-10-CM | POA: Diagnosis not present

## 2023-09-17 DIAGNOSIS — M810 Age-related osteoporosis without current pathological fracture: Secondary | ICD-10-CM | POA: Diagnosis present

## 2023-09-17 DIAGNOSIS — D649 Anemia, unspecified: Secondary | ICD-10-CM | POA: Diagnosis not present

## 2023-09-17 DIAGNOSIS — J9809 Other diseases of bronchus, not elsewhere classified: Secondary | ICD-10-CM | POA: Diagnosis present

## 2023-09-17 DIAGNOSIS — K219 Gastro-esophageal reflux disease without esophagitis: Secondary | ICD-10-CM | POA: Diagnosis present

## 2023-09-17 DIAGNOSIS — J470 Bronchiectasis with acute lower respiratory infection: Secondary | ICD-10-CM | POA: Diagnosis not present

## 2023-09-17 DIAGNOSIS — N184 Chronic kidney disease, stage 4 (severe): Secondary | ICD-10-CM | POA: Diagnosis not present

## 2023-09-17 DIAGNOSIS — A419 Sepsis, unspecified organism: Principal | ICD-10-CM | POA: Diagnosis present

## 2023-09-17 DIAGNOSIS — Z885 Allergy status to narcotic agent status: Secondary | ICD-10-CM

## 2023-09-17 DIAGNOSIS — R571 Hypovolemic shock: Secondary | ICD-10-CM | POA: Diagnosis not present

## 2023-09-17 DIAGNOSIS — E8721 Acute metabolic acidosis: Secondary | ICD-10-CM | POA: Diagnosis not present

## 2023-09-17 DIAGNOSIS — R531 Weakness: Secondary | ICD-10-CM | POA: Diagnosis not present

## 2023-09-17 DIAGNOSIS — N281 Cyst of kidney, acquired: Secondary | ICD-10-CM | POA: Diagnosis not present

## 2023-09-17 DIAGNOSIS — Z79899 Other long term (current) drug therapy: Secondary | ICD-10-CM

## 2023-09-17 DIAGNOSIS — D696 Thrombocytopenia, unspecified: Secondary | ICD-10-CM | POA: Diagnosis not present

## 2023-09-17 DIAGNOSIS — I130 Hypertensive heart and chronic kidney disease with heart failure and stage 1 through stage 4 chronic kidney disease, or unspecified chronic kidney disease: Secondary | ICD-10-CM | POA: Diagnosis not present

## 2023-09-17 DIAGNOSIS — E119 Type 2 diabetes mellitus without complications: Secondary | ICD-10-CM

## 2023-09-17 DIAGNOSIS — Q613 Polycystic kidney, unspecified: Secondary | ICD-10-CM

## 2023-09-17 DIAGNOSIS — I6523 Occlusion and stenosis of bilateral carotid arteries: Secondary | ICD-10-CM | POA: Diagnosis not present

## 2023-09-17 DIAGNOSIS — J398 Other specified diseases of upper respiratory tract: Secondary | ICD-10-CM | POA: Diagnosis not present

## 2023-09-17 DIAGNOSIS — J9859 Other diseases of mediastinum, not elsewhere classified: Secondary | ICD-10-CM | POA: Diagnosis not present

## 2023-09-17 DIAGNOSIS — M549 Dorsalgia, unspecified: Secondary | ICD-10-CM | POA: Diagnosis present

## 2023-09-17 DIAGNOSIS — Z823 Family history of stroke: Secondary | ICD-10-CM

## 2023-09-17 DIAGNOSIS — I2510 Atherosclerotic heart disease of native coronary artery without angina pectoris: Secondary | ICD-10-CM | POA: Diagnosis present

## 2023-09-17 DIAGNOSIS — N189 Chronic kidney disease, unspecified: Secondary | ICD-10-CM | POA: Diagnosis not present

## 2023-09-17 DIAGNOSIS — Z87891 Personal history of nicotine dependence: Secondary | ICD-10-CM

## 2023-09-17 DIAGNOSIS — I4719 Other supraventricular tachycardia: Secondary | ICD-10-CM | POA: Diagnosis present

## 2023-09-17 DIAGNOSIS — N179 Acute kidney failure, unspecified: Secondary | ICD-10-CM | POA: Diagnosis present

## 2023-09-17 DIAGNOSIS — Z7982 Long term (current) use of aspirin: Secondary | ICD-10-CM

## 2023-09-17 DIAGNOSIS — N170 Acute kidney failure with tubular necrosis: Principal | ICD-10-CM | POA: Diagnosis present

## 2023-09-17 DIAGNOSIS — E861 Hypovolemia: Secondary | ICD-10-CM | POA: Diagnosis present

## 2023-09-17 DIAGNOSIS — D539 Nutritional anemia, unspecified: Secondary | ICD-10-CM | POA: Diagnosis present

## 2023-09-17 DIAGNOSIS — G928 Other toxic encephalopathy: Secondary | ICD-10-CM | POA: Diagnosis present

## 2023-09-17 DIAGNOSIS — Z794 Long term (current) use of insulin: Secondary | ICD-10-CM

## 2023-09-17 DIAGNOSIS — J90 Pleural effusion, not elsewhere classified: Secondary | ICD-10-CM | POA: Diagnosis not present

## 2023-09-17 DIAGNOSIS — Z7985 Long-term (current) use of injectable non-insulin antidiabetic drugs: Secondary | ICD-10-CM

## 2023-09-17 DIAGNOSIS — I080 Rheumatic disorders of both mitral and aortic valves: Secondary | ICD-10-CM | POA: Diagnosis not present

## 2023-09-17 DIAGNOSIS — L8990 Pressure ulcer of unspecified site, unspecified stage: Secondary | ICD-10-CM | POA: Insufficient documentation

## 2023-09-17 DIAGNOSIS — E66811 Obesity, class 1: Secondary | ICD-10-CM | POA: Diagnosis not present

## 2023-09-17 DIAGNOSIS — E872 Acidosis, unspecified: Secondary | ICD-10-CM | POA: Diagnosis not present

## 2023-09-17 DIAGNOSIS — R339 Retention of urine, unspecified: Secondary | ICD-10-CM | POA: Diagnosis not present

## 2023-09-17 DIAGNOSIS — R579 Shock, unspecified: Secondary | ICD-10-CM | POA: Diagnosis not present

## 2023-09-17 DIAGNOSIS — I252 Old myocardial infarction: Secondary | ICD-10-CM

## 2023-09-17 DIAGNOSIS — Z66 Do not resuscitate: Secondary | ICD-10-CM | POA: Diagnosis present

## 2023-09-17 DIAGNOSIS — D631 Anemia in chronic kidney disease: Secondary | ICD-10-CM | POA: Diagnosis not present

## 2023-09-17 DIAGNOSIS — I517 Cardiomegaly: Secondary | ICD-10-CM | POA: Diagnosis not present

## 2023-09-17 DIAGNOSIS — J9601 Acute respiratory failure with hypoxia: Secondary | ICD-10-CM | POA: Diagnosis not present

## 2023-09-17 DIAGNOSIS — J45909 Unspecified asthma, uncomplicated: Secondary | ICD-10-CM | POA: Diagnosis present

## 2023-09-17 DIAGNOSIS — G8929 Other chronic pain: Secondary | ICD-10-CM | POA: Diagnosis present

## 2023-09-17 DIAGNOSIS — K7200 Acute and subacute hepatic failure without coma: Secondary | ICD-10-CM | POA: Diagnosis not present

## 2023-09-17 DIAGNOSIS — I700 Atherosclerosis of aorta: Secondary | ICD-10-CM | POA: Diagnosis not present

## 2023-09-17 DIAGNOSIS — Z6834 Body mass index (BMI) 34.0-34.9, adult: Secondary | ICD-10-CM

## 2023-09-17 DIAGNOSIS — W19XXXA Unspecified fall, initial encounter: Secondary | ICD-10-CM | POA: Diagnosis present

## 2023-09-17 DIAGNOSIS — Z951 Presence of aortocoronary bypass graft: Secondary | ICD-10-CM

## 2023-09-17 DIAGNOSIS — J8489 Other specified interstitial pulmonary diseases: Secondary | ICD-10-CM | POA: Diagnosis present

## 2023-09-17 DIAGNOSIS — I4510 Unspecified right bundle-branch block: Secondary | ICD-10-CM | POA: Diagnosis present

## 2023-09-17 DIAGNOSIS — I214 Non-ST elevation (NSTEMI) myocardial infarction: Secondary | ICD-10-CM | POA: Diagnosis not present

## 2023-09-17 DIAGNOSIS — Z96652 Presence of left artificial knee joint: Secondary | ICD-10-CM | POA: Diagnosis present

## 2023-09-17 DIAGNOSIS — A0811 Acute gastroenteropathy due to Norwalk agent: Secondary | ICD-10-CM | POA: Diagnosis present

## 2023-09-17 DIAGNOSIS — E2740 Unspecified adrenocortical insufficiency: Secondary | ICD-10-CM | POA: Diagnosis present

## 2023-09-17 DIAGNOSIS — L89151 Pressure ulcer of sacral region, stage 1: Secondary | ICD-10-CM | POA: Diagnosis present

## 2023-09-17 DIAGNOSIS — Z7952 Long term (current) use of systemic steroids: Secondary | ICD-10-CM

## 2023-09-17 DIAGNOSIS — R410 Disorientation, unspecified: Secondary | ICD-10-CM | POA: Diagnosis not present

## 2023-09-17 DIAGNOSIS — R5383 Other fatigue: Secondary | ICD-10-CM

## 2023-09-17 DIAGNOSIS — Z8249 Family history of ischemic heart disease and other diseases of the circulatory system: Secondary | ICD-10-CM

## 2023-09-17 DIAGNOSIS — E785 Hyperlipidemia, unspecified: Secondary | ICD-10-CM | POA: Diagnosis present

## 2023-09-17 DIAGNOSIS — R338 Other retention of urine: Secondary | ICD-10-CM

## 2023-09-17 DIAGNOSIS — E669 Obesity, unspecified: Secondary | ICD-10-CM | POA: Diagnosis present

## 2023-09-17 DIAGNOSIS — E1122 Type 2 diabetes mellitus with diabetic chronic kidney disease: Secondary | ICD-10-CM | POA: Diagnosis present

## 2023-09-17 DIAGNOSIS — R0689 Other abnormalities of breathing: Secondary | ICD-10-CM | POA: Diagnosis not present

## 2023-09-17 DIAGNOSIS — M62830 Muscle spasm of back: Secondary | ICD-10-CM | POA: Diagnosis present

## 2023-09-17 DIAGNOSIS — R935 Abnormal findings on diagnostic imaging of other abdominal regions, including retroperitoneum: Secondary | ICD-10-CM | POA: Diagnosis not present

## 2023-09-17 HISTORY — DX: Other specified diseases of upper respiratory tract: J39.8

## 2023-09-17 HISTORY — DX: Atherosclerotic heart disease of native coronary artery without angina pectoris: I25.10

## 2023-09-17 HISTORY — DX: Unspecified asthma, uncomplicated: J45.909

## 2023-09-17 HISTORY — DX: Supraventricular tachycardia, unspecified: I47.10

## 2023-09-17 HISTORY — DX: Rheumatic mitral stenosis: I05.0

## 2023-09-17 HISTORY — DX: Nonrheumatic aortic (valve) stenosis: I35.0

## 2023-09-17 HISTORY — DX: Type 2 diabetes mellitus without complications: E11.9

## 2023-09-17 HISTORY — DX: Chronic kidney disease, stage 4 (severe): N18.4

## 2023-09-17 LAB — IRON AND TIBC
Iron: 198 ug/dL — ABNORMAL HIGH (ref 45–182)
Saturation Ratios: 68 % — ABNORMAL HIGH (ref 17.9–39.5)
TIBC: 291 ug/dL (ref 250–450)
UIBC: 93 ug/dL

## 2023-09-17 LAB — CK: Total CK: 129 U/L (ref 49–397)

## 2023-09-17 LAB — HEPATIC FUNCTION PANEL
ALT: 54 U/L — ABNORMAL HIGH (ref 0–44)
AST: 60 U/L — ABNORMAL HIGH (ref 15–41)
Albumin: 2.7 g/dL — ABNORMAL LOW (ref 3.5–5.0)
Alkaline Phosphatase: 59 U/L (ref 38–126)
Bilirubin, Direct: 0.3 mg/dL — ABNORMAL HIGH (ref 0.0–0.2)
Indirect Bilirubin: 0.9 mg/dL (ref 0.3–0.9)
Total Bilirubin: 1.2 mg/dL (ref 0.0–1.2)
Total Protein: 4.9 g/dL — ABNORMAL LOW (ref 6.5–8.1)

## 2023-09-17 LAB — COMPREHENSIVE METABOLIC PANEL
ALT: 61 U/L — ABNORMAL HIGH (ref 0–44)
AST: 66 U/L — ABNORMAL HIGH (ref 15–41)
Albumin: 3.1 g/dL — ABNORMAL LOW (ref 3.5–5.0)
Alkaline Phosphatase: 65 U/L (ref 38–126)
Anion gap: 21 — ABNORMAL HIGH (ref 5–15)
BUN: 155 mg/dL — ABNORMAL HIGH (ref 8–23)
CO2: 13 mmol/L — ABNORMAL LOW (ref 22–32)
Calcium: 7.3 mg/dL — ABNORMAL LOW (ref 8.9–10.3)
Chloride: 107 mmol/L (ref 98–111)
Creatinine, Ser: 7.03 mg/dL — ABNORMAL HIGH (ref 0.61–1.24)
GFR, Estimated: 7 mL/min — ABNORMAL LOW (ref >60)
Glucose, Bld: 138 mg/dL — ABNORMAL HIGH (ref 70–99)
Potassium: 6.1 mmol/L — ABNORMAL HIGH (ref 3.5–5.1)
Sodium: 141 mmol/L (ref 135–145)
Total Bilirubin: 0.9 mg/dL (ref 0.0–1.2)
Total Protein: 5.5 g/dL — ABNORMAL LOW (ref 6.5–8.1)

## 2023-09-17 LAB — BASIC METABOLIC PANEL
Anion gap: 20 — ABNORMAL HIGH (ref 5–15)
Anion gap: 21 — ABNORMAL HIGH (ref 5–15)
BUN: 143 mg/dL — ABNORMAL HIGH (ref 8–23)
BUN: 150 mg/dL — ABNORMAL HIGH (ref 8–23)
CO2: 11 mmol/L — ABNORMAL LOW (ref 22–32)
CO2: 12 mmol/L — ABNORMAL LOW (ref 22–32)
Calcium: 6.9 mg/dL — ABNORMAL LOW (ref 8.9–10.3)
Calcium: 6.9 mg/dL — ABNORMAL LOW (ref 8.9–10.3)
Chloride: 106 mmol/L (ref 98–111)
Chloride: 104 mmol/L (ref 98–111)
Creatinine, Ser: 6.89 mg/dL — ABNORMAL HIGH (ref 0.61–1.24)
Creatinine, Ser: 7.13 mg/dL — ABNORMAL HIGH (ref 0.61–1.24)
GFR, Estimated: 8 mL/min — ABNORMAL LOW (ref >60)
GFR, Estimated: 7 mL/min — ABNORMAL LOW (ref >60)
Glucose, Bld: 112 mg/dL — ABNORMAL HIGH (ref 70–99)
Glucose, Bld: 142 mg/dL — ABNORMAL HIGH (ref 70–99)
Potassium: 5.2 mmol/L — ABNORMAL HIGH (ref 3.5–5.1)
Potassium: 5.6 mmol/L — ABNORMAL HIGH (ref 3.5–5.1)
Sodium: 137 mmol/L (ref 135–145)
Sodium: 137 mmol/L (ref 135–145)

## 2023-09-17 LAB — CBC WITH DIFFERENTIAL/PLATELET
Abs Immature Granulocytes: 0.03 10*3/uL (ref 0.00–0.07)
Abs Immature Granulocytes: 0.05 10*3/uL (ref 0.00–0.07)
Abs Immature Granulocytes: 0.04 10*3/uL (ref 0.00–0.07)
Basophils Absolute: 0 10*3/uL (ref 0.0–0.1)
Basophils Absolute: 0 10*3/uL (ref 0.0–0.1)
Basophils Absolute: 0 10*3/uL (ref 0.0–0.1)
Basophils Relative: 0 %
Basophils Relative: 0 %
Basophils Relative: 0 %
Eosinophils Absolute: 0 10*3/uL (ref 0.0–0.5)
Eosinophils Absolute: 0 10*3/uL (ref 0.0–0.5)
Eosinophils Absolute: 0 10*3/uL (ref 0.0–0.5)
Eosinophils Relative: 0 %
Eosinophils Relative: 0 %
Eosinophils Relative: 0 %
HCT: 25.7 % — ABNORMAL LOW (ref 39.0–52.0)
HCT: 21.8 % — ABNORMAL LOW (ref 39.0–52.0)
HCT: 20.7 % — ABNORMAL LOW (ref 39.0–52.0)
Hemoglobin: 7.9 g/dL — ABNORMAL LOW (ref 13.0–17.0)
Hemoglobin: 6.9 g/dL — ABNORMAL LOW (ref 13.0–17.0)
Hemoglobin: 6.7 g/dL — ABNORMAL LOW (ref 13.0–17.0)
Immature Granulocytes: 1 %
Immature Granulocytes: 1 %
Immature Granulocytes: 1 %
Lymphocytes Relative: 11 %
Lymphocytes Relative: 5 %
Lymphocytes Relative: 7 %
Lymphs Abs: 0.6 10*3/uL — ABNORMAL LOW (ref 0.7–4.0)
Lymphs Abs: 0.4 10*3/uL — ABNORMAL LOW (ref 0.7–4.0)
Lymphs Abs: 0.5 10*3/uL — ABNORMAL LOW (ref 0.7–4.0)
MCH: 32 pg (ref 26.0–34.0)
MCH: 32.5 pg (ref 26.0–34.0)
MCH: 33 pg (ref 26.0–34.0)
MCHC: 30.7 g/dL (ref 30.0–36.0)
MCHC: 31.7 g/dL (ref 30.0–36.0)
MCHC: 32.4 g/dL (ref 30.0–36.0)
MCV: 104 fL — ABNORMAL HIGH (ref 80.0–100.0)
MCV: 102.8 fL — ABNORMAL HIGH (ref 80.0–100.0)
MCV: 102 fL — ABNORMAL HIGH (ref 80.0–100.0)
Monocytes Absolute: 0.3 10*3/uL (ref 0.1–1.0)
Monocytes Absolute: 0.4 10*3/uL (ref 0.1–1.0)
Monocytes Absolute: 0.3 10*3/uL (ref 0.1–1.0)
Monocytes Relative: 5 %
Monocytes Relative: 4 %
Monocytes Relative: 3 %
Neutro Abs: 4.9 10*3/uL (ref 1.7–7.7)
Neutro Abs: 7.1 10*3/uL (ref 1.7–7.7)
Neutro Abs: 7.4 10*3/uL (ref 1.7–7.7)
Neutrophils Relative %: 83 %
Neutrophils Relative %: 90 %
Neutrophils Relative %: 89 %
Platelets: 66 10*3/uL — ABNORMAL LOW (ref 150–400)
Platelets: 54 10*3/uL — ABNORMAL LOW (ref 150–400)
Platelets: 54 10*3/uL — ABNORMAL LOW (ref 150–400)
RBC: 2.47 MIL/uL — ABNORMAL LOW (ref 4.22–5.81)
RBC: 2.12 MIL/uL — ABNORMAL LOW (ref 4.22–5.81)
RBC: 2.03 MIL/uL — ABNORMAL LOW (ref 4.22–5.81)
RDW: 16 % — ABNORMAL HIGH (ref 11.5–15.5)
RDW: 16.2 % — ABNORMAL HIGH (ref 11.5–15.5)
RDW: 16 % — ABNORMAL HIGH (ref 11.5–15.5)
Smear Review: NORMAL
Smear Review: NORMAL
Smear Review: NORMAL
WBC Morphology: INCREASED
WBC: 5.8 10*3/uL (ref 4.0–10.5)
WBC: 8 10*3/uL (ref 4.0–10.5)
WBC: 8.2 10*3/uL (ref 4.0–10.5)
nRBC: 0 % (ref 0.0–0.2)
nRBC: 0.3 % — ABNORMAL HIGH (ref 0.0–0.2)
nRBC: 0 % (ref 0.0–0.2)

## 2023-09-17 LAB — URINALYSIS, ROUTINE W REFLEX MICROSCOPIC
Bilirubin Urine: NEGATIVE
Glucose, UA: NEGATIVE mg/dL
Hgb urine dipstick: NEGATIVE
Ketones, ur: NEGATIVE mg/dL
Leukocytes,Ua: NEGATIVE
Nitrite: NEGATIVE
Protein, ur: 30 mg/dL — AB
Specific Gravity, Urine: 1.012 (ref 1.005–1.030)
Squamous Epithelial / HPF: 0 /[HPF] (ref 0–5)
pH: 5 (ref 5.0–8.0)

## 2023-09-17 LAB — TECHNOLOGIST SMEAR REVIEW: Plt Morphology: NORMAL

## 2023-09-17 LAB — PROTIME-INR
INR: 1.2 (ref 0.8–1.2)
Prothrombin Time: 15.7 s — ABNORMAL HIGH (ref 11.4–15.2)

## 2023-09-17 LAB — LACTIC ACID, PLASMA
Lactic Acid, Venous: 6.3 mmol/L (ref 0.5–1.9)
Lactic Acid, Venous: 4.7 mmol/L (ref 0.5–1.9)
Lactic Acid, Venous: 4.7 mmol/L (ref 0.5–1.9)

## 2023-09-17 LAB — APTT
aPTT: 28 s (ref 24–36)
aPTT: >200 s (ref 24–36)

## 2023-09-17 LAB — CBG MONITORING, ED
Glucose-Capillary: 102 mg/dL — ABNORMAL HIGH (ref 70–99)
Glucose-Capillary: 108 mg/dL — ABNORMAL HIGH (ref 70–99)

## 2023-09-17 LAB — HEPATITIS PANEL, ACUTE
HCV Ab: NONREACTIVE
Hep A IgM: NONREACTIVE
Hep B C IgM: NONREACTIVE
Hepatitis B Surface Ag: NONREACTIVE

## 2023-09-17 LAB — RESP PANEL BY RT-PCR (RSV, FLU A&B, COVID)  RVPGX2
Influenza A by PCR: NEGATIVE
Influenza B by PCR: NEGATIVE
Resp Syncytial Virus by PCR: NEGATIVE
SARS Coronavirus 2 by RT PCR: NEGATIVE

## 2023-09-17 LAB — RETICULOCYTES
Immature Retic Fract: 3.6 % (ref 2.3–15.9)
RBC.: 2.02 MIL/uL — ABNORMAL LOW (ref 4.22–5.81)
Retic Count, Absolute: 11.5 10*3/uL — ABNORMAL LOW (ref 19.0–186.0)
Retic Ct Pct: 0.6 % (ref 0.4–3.1)

## 2023-09-17 LAB — TROPONIN I (HIGH SENSITIVITY)
Troponin I (High Sensitivity): 1451 ng/L (ref <18)
Troponin I (High Sensitivity): 1320 ng/L (ref <18)

## 2023-09-17 LAB — MRSA NEXT GEN BY PCR, NASAL: MRSA by PCR Next Gen: NOT DETECTED

## 2023-09-17 LAB — FERRITIN: Ferritin: 3420 ng/mL — ABNORMAL HIGH (ref 24–336)

## 2023-09-17 LAB — FIBRINOGEN: Fibrinogen: 278 mg/dL (ref 210–475)

## 2023-09-17 LAB — D-DIMER, QUANTITATIVE: D-Dimer, Quant: 1.03 ug{FEU}/mL — ABNORMAL HIGH (ref 0.00–0.50)

## 2023-09-17 LAB — GLUCOSE, CAPILLARY
Glucose-Capillary: 131 mg/dL — ABNORMAL HIGH (ref 70–99)
Glucose-Capillary: 133 mg/dL — ABNORMAL HIGH (ref 70–99)

## 2023-09-17 LAB — PATHOLOGIST SMEAR REVIEW

## 2023-09-17 LAB — TSH: TSH: 0.989 u[IU]/mL (ref 0.350–4.500)

## 2023-09-17 LAB — BRAIN NATRIURETIC PEPTIDE: B Natriuretic Peptide: 320.1 pg/mL — ABNORMAL HIGH (ref 0.0–100.0)

## 2023-09-17 LAB — PROCALCITONIN: Procalcitonin: 0.64 ng/mL

## 2023-09-17 LAB — FOLATE: Folate: 9.3 ng/mL (ref >5.9)

## 2023-09-17 MED ORDER — ARGATROBAN 50 MG/50ML IV SOLN
0.5000 ug/kg/min | INTRAVENOUS | Status: DC
  Administered 2023-09-17 – 2023-09-18 (×2): 0.5 ug/kg/min via INTRAVENOUS
  Filled 2023-09-17: qty 50

## 2023-09-17 MED ORDER — HYDROCORTISONE SOD SUC (PF) 100 MG IJ SOLR: 100.0000 mg | Freq: Three times a day (TID) | INTRAMUSCULAR | Status: DC

## 2023-09-17 MED ORDER — SODIUM CHLORIDE 0.9% IV SOLUTION: Freq: Once | INTRAVENOUS | Status: AC

## 2023-09-17 MED ORDER — HYDROCORTISONE SOD SUC (PF) 100 MG IJ SOLR
50.0000 mg | Freq: Three times a day (TID) | INTRAMUSCULAR | Status: DC
  Filled 2023-09-17: qty 1

## 2023-09-17 MED ORDER — SODIUM CHLORIDE 0.9% FLUSH: 3.0000 mL | INTRAVENOUS | Status: DC | PRN

## 2023-09-17 MED ORDER — SODIUM ZIRCONIUM CYCLOSILICATE 10 G PO PACK
10.0000 g | PACK | Freq: Once | ORAL | Status: AC
  Administered 2023-09-17: 10 g via ORAL
  Filled 2023-09-17: qty 1

## 2023-09-17 MED ORDER — DEXTROSE 50 % IV SOLN
1.0000 | Freq: Once | INTRAVENOUS | Status: AC
  Administered 2023-09-17: 50 mL via INTRAVENOUS
  Filled 2023-09-17: qty 50

## 2023-09-17 MED ORDER — SODIUM BICARBONATE 8.4 % IV SOLN
INTRAVENOUS | Status: DC
  Filled 2023-09-17: qty 150
  Filled 2023-09-17: qty 1000

## 2023-09-17 MED ORDER — LACTATED RINGERS IV BOLUS
1000.0000 mL | Freq: Once | INTRAVENOUS | Status: AC
  Administered 2023-09-17: 1000 mL via INTRAVENOUS

## 2023-09-17 MED ORDER — HYDROCORTISONE SOD SUC (PF) 100 MG IJ SOLR
100.0000 mg | Freq: Three times a day (TID) | INTRAMUSCULAR | Status: DC
  Administered 2023-09-17 – 2023-09-19 (×6): 100 mg via INTRAVENOUS
  Filled 2023-09-17 (×6): qty 2

## 2023-09-17 MED ORDER — INSULIN ASPART 100 UNIT/ML IV SOLN
10.0000 [IU] | Freq: Once | INTRAVENOUS | Status: AC
  Administered 2023-09-17: 10 [IU] via INTRAVENOUS
  Filled 2023-09-17: qty 0.1

## 2023-09-17 MED ORDER — HYDROCORTISONE SOD SUC (PF) 100 MG IJ SOLR
100.0000 mg | Freq: Once | INTRAMUSCULAR | Status: AC
  Administered 2023-09-17: 100 mg via INTRAVENOUS
  Filled 2023-09-17: qty 2

## 2023-09-17 MED ORDER — DOCUSATE SODIUM 100 MG PO CAPS: 100.0000 mg | ORAL_CAPSULE | Freq: Two times a day (BID) | ORAL | Status: DC | PRN

## 2023-09-17 MED ORDER — FAMOTIDINE IN NACL 20-0.9 MG/50ML-% IV SOLN
20.0000 mg | INTRAVENOUS | Status: DC
  Administered 2023-09-17: 20 mg via INTRAVENOUS
  Filled 2023-09-17: qty 50

## 2023-09-17 MED ORDER — HYDROCORTISONE SOD SUC (PF) 100 MG IJ SOLR: 50.0000 mg | Freq: Three times a day (TID) | INTRAMUSCULAR | Status: DC

## 2023-09-17 MED ORDER — POLYETHYLENE GLYCOL 3350 17 G PO PACK: 17.0000 g | PACK | Freq: Every day | ORAL | Status: DC | PRN

## 2023-09-17 MED ORDER — HEPARIN SODIUM (PORCINE) 5000 UNIT/ML IJ SOLN
4000.0000 [IU] | Freq: Once | INTRAMUSCULAR | Status: AC
  Administered 2023-09-17: 4000 [IU] via INTRAVENOUS
  Filled 2023-09-17: qty 1

## 2023-09-17 MED ORDER — NOREPINEPHRINE 4 MG/250ML-% IV SOLN: 2.0000 ug/min | INTRAVENOUS | Status: DC

## 2023-09-17 MED ORDER — SODIUM CHLORIDE 0.9% FLUSH
3.0000 mL | Freq: Two times a day (BID) | INTRAVENOUS | Status: DC
  Administered 2023-09-18 – 2023-09-19 (×3): 3 mL via INTRAVENOUS

## 2023-09-17 MED ORDER — HEPARIN (PORCINE) 25000 UT/250ML-% IV SOLN: 1250.0000 [IU]/h | INTRAVENOUS | Status: DC

## 2023-09-17 MED ORDER — HEPARIN (PORCINE) 25000 UT/250ML-% IV SOLN
1250.0000 [IU]/h | INTRAVENOUS | Status: DC
  Administered 2023-09-17: 1250 [IU]/h via INTRAVENOUS
  Filled 2023-09-17: qty 250

## 2023-09-17 MED ORDER — ARGATROBAN 50 MG/50ML IV SOLN
0.5000 ug/kg/min | INTRAVENOUS | Status: DC
  Filled 2023-09-17: qty 50

## 2023-09-17 MED ORDER — INSULIN ASPART 100 UNIT/ML IJ SOLN
0.0000 [IU] | INTRAMUSCULAR | Status: DC
  Administered 2023-09-17 – 2023-09-19 (×4): 1 [IU] via SUBCUTANEOUS
  Filled 2023-09-17 (×4): qty 1

## 2023-09-17 MED ORDER — PIPERACILLIN-TAZOBACTAM IN DEX 2-0.25 GM/50ML IV SOLN
2.2500 g | Freq: Four times a day (QID) | INTRAVENOUS | Status: DC
Start: 2023-09-17 — End: 2023-09-18
  Administered 2023-09-17 – 2023-09-18 (×2): 2.25 g via INTRAVENOUS
  Filled 2023-09-17 (×4): qty 50

## 2023-09-17 MED ORDER — SODIUM CHLORIDE 0.9 % IV SOLN: 250.0000 mL | INTRAVENOUS | Status: AC | PRN

## 2023-09-17 MED ORDER — SODIUM CHLORIDE 0.9 % IV SOLN
250.0000 mL | INTRAVENOUS | Status: AC
  Administered 2023-09-17: 250 mL via INTRAVENOUS

## 2023-09-17 MED ORDER — HEPARIN BOLUS VIA INFUSION: 4000.0000 [IU] | Freq: Once | INTRAVENOUS | Status: DC

## 2023-09-17 NOTE — Consult Note (Addendum)
 Initial Consultation Note   Patient: Kurt Aguilar FMW:[id number] DOB: [birth year] PCP: Randeen Laine LABOR, MD DOA: 09/17/2023 DOS: the patient was seen and examined on 09/17/2023 Primary service: Claudene Rover, MD  Referring physician: Rover Claudene  Reason for consult: Septic shock, NSTEMI, AKI, thrombocytopenia, encephalopathy   Assessment/Plan: Assessment and Plan: Patient presented with multiple active issues including septic shock, NSTEMI, AKI, thrombocytopenia encephalopathy.  Current systolic pressures are in the 29d despite aggressive IV fluids as well as IV hydrocortisone  in setting of chronic steroid use.Troponins 1400s-1300s. On heparin  gtt. Noted plt 60s. Hgb 7.9. PCT 0.64. K 6.1, Cr 7.  Given multiorgan dysfunction and persistent hypotension, pt will be tentatively admitted to the critical care service for further evaluation.  Had a relatively lengthy discussion with wife at the bedside who is fully aware of current medical condition.  Noted to be DNR.  Is in the process of discussing overall presentation with family to make formal decision as to further management including palliative care.  Will otherwise defer further management to critical care team    TRH will sign off at present, please call us  again when needed.  HPI: Kurt Aguilar is a 78 y.o. male with past medical history of of tracheobronchomalacia (previously diagnosed as BOOP) on chronic steroid therapy, CAD s/p CABG (1997), CKD stage IV, hypertension, type 2 diabetes, OSA on CPAP presenting with multiple medical issues including septic shock, NSTEMI, AKI, thrombocytopenia, encephalopathy, hyperkalemia.  History primarily from patient's wife in the setting of encephalopathy.  Patient noted to have been recently admitted December 22 through December 29 for issues including hypoxic respiratory failure secondary to rhinovirus pneumonia as well as superimposed bacterial pneumonia, NSTEMI, acute on chronic kidney injury.  Patient  currently at local facility.  Per report, patient with worsening confusion over the past week.  Worsening weakness and functional status.  Has had marked hallucinations.  Decreased p.o. intake.  No reported fevers or chills.  No reported nausea or vomiting. Presented to the ER afebrile, heart rate 50s to 100s, blood pressure 70s, satting 100% on 2 L.  White count 5.8, hemoglobin 7.9, platelets 66, VBG with metabolic acidosis, lactic acidosis of 6.3-4.7.  Troponin 1400s to 1300s.  COVID flu and RSV negative.  Urinalysis not indicative of infection.  Creatinine 7.03.  Potassium 6.1.  BNP of 320.  Procalcitonin 0.64.  CT head within normal limits.  Chest x-ray with enlarged heart and improving left pleural effusion.  CT of the abdomen pelvis with noted possible multifocal pneumonia as well as polycystic kidneys.  Review of Systems: unable to review all systems due to the inability of the patient to answer questions. Past Medical History:  Diagnosis Date   Arthritis    Chronic kidney disease    Complication of anesthesia    constipation and inability to  urinate happened a few days after cataract surgery    Diabetes (HCC)    type 2    Diverticulitis    GERD (gastroesophageal reflux disease)    Gout    Heart attack (HCC)    Heart disease    Heart murmur    Hyperglycemia    Hyperlipidemia    Hypertension    Osteoporosis    Pneumonia    hx of BOOP followed by Dr Darlean    Sleep apnea    cpapp - setting at 17    Past Surgical History:  Procedure Laterality Date   ACROMIO-CLAVICULAR JOINT REPAIR Left 05/05/2019   Procedure: SHOULDER ARTHROSCOPIC ASSISTED ACROMIO-CLAVICULAR  JOINT REPAIR;  Surgeon: Dozier Soulier, MD;  Location: [facility];  Service: Orthopedics;  Laterality: Left;   CATARACT EXTRACTION Bilateral    CORONARY ARTERY BYPASS GRAFT     [place]    LEFT HEART CATH AND CORS/GRAFTS ANGIOGRAPHY N/A 09/19/2021   Procedure: LEFT HEART CATH AND CORS/GRAFTS ANGIOGRAPHY;  Surgeon: Claudene Victory ORN, MD;  Location: [facility];  Service: Cardiovascular;  Laterality: N/A;   open heart surgery  09/09/1995-1998   5 bypass   REPAIR KNEE LIGAMENT     right   SHOULDER ARTHROSCOPY Left 05/05/2019   Procedure: ARTHROSCOPY SHOULDER;  Surgeon: Dozier Soulier, MD;  Location: [facility];  Service: Orthopedics;  Laterality: Left;   TONSILLECTOMY     TOTAL KNEE ARTHROPLASTY Left 05/29/2020   Procedure: LEFT TOTAL KNEE ARTHROPLASTY;  Surgeon: Sheril Coy, MD;  Location: [facility];  Service: Orthopedics;  Laterality: Left;   Social History:  reports that he quit smoking about 18 years ago. His smoking use included cigars and cigarettes. He started smoking about 21 years ago. He has a 3 pack-year smoking history. He has never used smokeless tobacco. He reports current alcohol  use of about 1.0 standard drink of alcohol  per week. He reports that he does not use drugs.  Allergies  Allergen Reactions   Oxycodone  Anaphylaxis    02/25/2022     Family History  Problem Relation Age of Onset   Heart disease Mother    Heart disease Father    Stroke Father    Heart attack Son 75   Kidney disease Neg Hx    Prostate cancer Neg Hx    Diabetes Neg Hx     Prior to Admission medications   Medication Sig Start Date End Date Taking? Authorizing Provider  acetaminophen  (TYLENOL ) 500 MG tablet Take 1,000 mg by mouth every 8 (eight) hours as needed.    [provider]  albuterol  (PROVENTIL ) (2.5 MG/3ML) 0.083% nebulizer solution Take 3 mLs (2.5 mg total) by nebulization 2 (two) times daily. Patient not taking: Reported on 09/17/2023 08/28/23   Darci Pore, MD  allopurinol  (ZYLOPRIM ) 100 MG tablet Take 50 mg by mouth daily. Every 2 days. Patient not taking: Reported on 09/17/2023    [provider]  aspirin  EC (ASPIRIN  81) 81 MG tablet Take 81 mg by mouth daily. Swallow whole.    [provider]  bisoprolol  (ZEBETA ) 5 MG tablet Take 0.5 tablets (2.5 mg total) by mouth  daily. 08/29/23   Darci Pore, MD  Calcium  Carbonate-Vit D-Min (CALCIUM -VITAMIN D -MINERALS) 600-800 MG-UNIT CHEW Chew 1 tablet by mouth daily.    [provider]  Continuous Blood Gluc Receiver (FREESTYLE LIBRE 2 READER) DEVI USE AS INSTRUCTED TO CHECK BLOOD SUGARS. 01/13/22   Kassie Mallick, MD  Continuous Blood Gluc Sensor (FREESTYLE LIBRE 2 SENSOR) MISC 1 Device by Does not apply route every 14 (fourteen) days. 10/31/22   Trixie File, MD  cyanocobalamin  (VITAMIN B12) 500 MCG tablet Take 500 mcg by mouth daily.    [provider]  ezetimibe  (ZETIA ) 10 MG tablet TAKE 1 TABLET BY MOUTH EVERY DAY 07/08/23   Tower, Laine LABOR, MD  famotidine  (PEPCID ) 20 MG tablet TAKE 1 TABLET BY MOUTH EVERYDAY AT BEDTIME 04/10/23   Darlean Ozell NOVAK, MD  gabapentin  (NEURONTIN ) 100 MG capsule Take 3 capsules (300 mg total) by mouth at bedtime. Reduced from 300 mg. Patient taking differently: Take 300 mg by mouth at bedtime. Give one capsule by mouth once daily. 09/14/23  Abdul Fine, MD  Glucosamine HCl (GLUCOSAMINE PO) Take 1,500 mg by mouth 2 (two) times daily.     [provider]  glucose blood (FREESTYLE LITE) test strip 1 each by Other route 4 (four) times daily. E11.65 04/10/20   Kassie Mallick, MD  HYDROcodone -acetaminophen  (NORCO/VICODIN) 5-325 MG tablet Take 1 tablet by mouth every 6 (six) hours as needed for moderate pain (pain score 4-6) or severe pain (pain score 7-10). 09/10/23   Caro Harlene POUR, NP  insulin  isophane & regular human KwikPen (NOVOLIN  70/30 KWIKPEN) (70-30) 100 UNIT/ML KwikPen Inject 30 Units into the skin daily with breakfast. And pen needles 1/day 05/08/23   Trixie File, MD  insulin  lispro (HUMALOG  KWIKPEN) 200 UNIT/ML KwikPen Inject 10 Units into the skin 2 (two) times daily before a meal. Inject before lunch and dinner. 09/06/23   Awanda City, MD  Insulin  Pen Needle (BD PEN NEEDLE NANO 2ND GEN) 32G X 4 MM MISC USE 3 TIMES A DAY 01/25/23   Trixie File, MD  ketoconazole (NIZORAL) 2 % cream Apply 1 Application topically 2 (two) times daily. 11/10/22   [provider]  Lancets (FREESTYLE) lancets 1 EACH BY OTHER ROUTE 4 (FOUR) TIMES DAILY. E11.65 08/23/20   Kassie Mallick, MD  linezolid  (ZYVOX ) 600 MG tablet Take 600 mg by mouth 2 (two) times daily.    [provider]  Menthol, Topical Analgesic, (BIOFREEZE COOL THE PAIN) 4 % GEL Apply to lower back topically four times daily    [provider]  Multiple Vitamins-Minerals (PRESERVISION AREDS 2 PO) Take 1 capsule by mouth daily.    [provider]  nitroGLYCERIN  (NITROSTAT ) 0.4 MG SL tablet Place 1 tablet (0.4 mg total) under the tongue every 5 (five) minutes x 3 doses as needed for chest pain. 01/19/23   Gollan, Timothy J, MD  omeprazole  (PRILOSEC) 40 MG capsule TAKE 1 CAPUSLE BY MOUTH 30- 60 MIN BEFORE YOUR FIRST AND LAST MEALS OF THE DAY 10/31/22   Darlean Ozell NOVAK, MD  ondansetron  (ZOFRAN ) 8 MG tablet Take 1 tablet (8 mg total) by mouth every 8 (eight) hours as needed for nausea or vomiting. From narcotic pain medicine 01/21/23   Tower, Marne A, MD  OXYGEN 2lpm every shift related to acute respiratory failure with hypoxia.    [provider]  polyethylene glycol (MIRALAX  / GLYCOLAX ) 17 g packet Take 17 g by mouth daily.    [provider]  predniSONE  (DELTASONE ) 20 MG tablet Starting on 09/07/23, take 30 mg prednisone  daily for 7 days, and decrease by 10mg  every 7 days until 10mg  and maintain until seen as outpatient by pulmonologist. Patient taking differently: Take 10 mg by mouth daily. Give 20mg  by mouth once daily for 6 days, then reduce to 10mg  daily. 09/06/23   Awanda City, MD  rosuvastatin  (CRESTOR ) 40 MG tablet TAKE 1 TABLET BY MOUTH EVERY DAY 11/18/22   Tower, Laine LABOR, MD  Semaglutide , 2 MG/DOSE, (OZEMPIC , 2 MG/DOSE,) 8 MG/3ML SOPN Inject 2 mg into the skin once a week. 05/08/23   Trixie File, MD  senna-docusate (SENOKOT-S) 8.6-50  MG tablet Take 1 tablet by mouth at bedtime. 08/28/23   Darci Pore, MD    Physical Exam: Vitals:   09/17/23 1152 09/17/23 1307 09/17/23 1330 09/17/23 1551  BP:  (!) 87/53 110/74   Pulse:  86 81   Resp:  14 16   Temp:    (!) 97.4 F (36.3 C)  TempSrc:    Axillary  SpO2:  100% 100%   Weight: 107.7 kg     Height: 5' 10 (1.778 m)      Physical Exam Constitutional:      Appearance: He is obese.     Comments: + generalized lethargy    HENT:     Head: Normocephalic.  Eyes:     Pupils: Pupils are equal, round, and reactive to light.  Cardiovascular:     Rate and Rhythm: Normal rate and regular rhythm.  Pulmonary:     Effort: Pulmonary effort is normal.  Abdominal:     Comments: Obese abdomen    Musculoskeletal:        General: Normal range of motion.  Skin:    General: Skin is warm.     Coloration: Skin is pale.  Neurological:     Comments: + generalized confusion   Psychiatric:        Mood and Affect: Mood normal.     Data Reviewed:   There are no new results to review at this time.   CT ABDOMEN PELVIS WO CONTRAST CLINICAL DATA:  Abdomen pain  EXAM: CT ABDOMEN AND PELVIS WITHOUT CONTRAST  TECHNIQUE: Multidetector CT imaging of the abdomen and pelvis was performed following the standard protocol without IV contrast.  RADIATION DOSE REDUCTION: This exam was performed according to the departmental dose-optimization program which includes automated exposure control, adjustment of the mA and/or kV according to patient size and/or use of iterative reconstruction technique.  COMPARISON:  CT 08/17/2022  FINDINGS: Lower chest: Lung bases demonstrate mild bronchiectasis in the lower lobes. Small foci of lower lobe consolidation and peribronchial thickening. Small focus of right middle lobe consolidation. Coronary vascular calcification. Aortic valve calcifications. Borderline to mild cardiomegaly.  Hepatobiliary: No calcified gallstone. No biliary  dilatation. Hepatic granuloma.  Pancreas: Unremarkable. No pancreatic ductal dilatation or surrounding inflammatory changes.  Spleen: Numerous calcified granuloma  Adrenals/Urinary Tract: Adrenal glands are normal. Polycystic kidneys bilaterally with numerous simple and complex cysts and subcentimeter hyper and hypodensities too small to further characterize, grossly stable as compared with 2023 and further assessment limited without contrast no hydronephrosis. Decompressed urinary bladder by Foley catheter  Stomach/Bowel: Moderate fluid distension of stomach. No evidence for bowel obstruction or bowel wall thickening. Negative appendix.  Vascular/Lymphatic: Moderate aortic atherosclerosis. No aneurysm. No suspicious lymph nodes  Reproductive: Negative prostate  Other: Negative for pelvic effusion or free air. Small fat containing inguinal hernias.  Musculoskeletal: No acute or suspicious osseous abnormality.  IMPRESSION: 1. No CT evidence for acute intra-abdominal or pelvic abnormality. 2. Mild lower lobe bronchiectasis. Peribronchial thickening with patchy foci of consolidations in the lower lobes and right middle lobe, possible multifocal pneumonia, imaging follow-up to resolution is recommended. 3. Polycystic kidneys. Numerous simple and complex bilateral renal cysts, grossly unchanged, further evaluation limited without contrast. Non emergent/outpatient renal CT or MRI follow-up contrast examination could be performed if desired. 4. Moderate fluid distension of stomach. 5. Aortic atherosclerosis.  Aortic Atherosclerosis (ICD10-I70.0).  Electronically Signed   By: Luke Bun M.D.   On: 09/17/2023 16:07 CT HEAD WO CONTRAST ( ) CLINICAL DATA:  Non focal encephalopathy. Evaluate for intracranial hemorrhage or CVA. Acute onset of confusion and lethargy.  EXAM: CT HEAD WITHOUT CONTRAST  TECHNIQUE: Contiguous axial images were obtained from the base of the  skull through the vertex without intravenous contrast.  RADIATION DOSE REDUCTION: This exam was performed according to the departmental dose-optimization program which includes automated exposure control, adjustment of the mA and/or kV according to patient size  and/or use of iterative reconstruction technique.  COMPARISON:  CT head without contrast 11/17/2021.  FINDINGS: Brain: Moderate atrophy and white matter changes demonstrate some progression since the prior exam. No acute infarct, hemorrhage, or mass lesion is present. The ventricles are of normal size. No significant extraaxial fluid collection is present.  The brainstem and cerebellum are within normal limits. A relatively empty sella is present. Midline structures are otherwise within normal limits.  Vascular: Atherosclerotic calcifications are present within the cavernous internal carotid arteries bilaterally. No hyperdense vessel is present.  Skull: Calvarium is intact. No focal lytic or blastic lesions are present. No significant extracranial soft tissue lesion is present.  Sinuses/Orbits: Bilateral lens replacements are noted. Globes and orbits are otherwise unremarkable. The paranasal sinuses and mastoid air cells are clear.  IMPRESSION: 1. No acute intracranial abnormality or significant interval change. 2. Moderate atrophy and white matter disease demonstrates some progression since the prior exam. This likely reflects the sequela of chronic microvascular ischemia.  Electronically Signed   By: Lonni Necessary M.D.   On: 09/17/2023 14:51 DG Chest Port 1 View CLINICAL DATA:  Sepsis  EXAM: PORTABLE CHEST 1 VIEW  COMPARISON:  X-ray 08/30/2023.  FINDINGS: Enlarged cardiopericardial silhouette. Sternal wires. Stable widened mediastinum with a calcified aorta. Persistent left pleural effusion but improving. Adjacent mild opacity. Presumed calcification along the left lung apex. No pneumothorax or  edema. Overlapping cardiac leads.  IMPRESSION: Postop chest.  Enlarged heart.  Improving left pleural effusion.  Electronically Signed   By: Ranell Bring M.D.   On: 09/17/2023 12:21  Lab Results  Component Value Date   WBC 8.0 09/17/2023   HGB 6.9 (L) 09/17/2023   HCT 21.8 (L) 09/17/2023   MCV 102.8 (H) 09/17/2023   PLT 54 (L) 09/17/2023   Last metabolic panel Lab Results  Component Value Date   GLUCOSE 138 (H) 09/17/2023   NA 141 09/17/2023   K 6.1 (H) 09/17/2023   CL 107 09/17/2023   CO2 13 (L) 09/17/2023   BUN 155 (H) 09/17/2023   CREATININE 7.03 (H) 09/17/2023   GFRNONAA 7 (L) 09/17/2023   CALCIUM  7.3 (L) 09/17/2023   PHOS 3.4 09/12/2016   PROT 5.5 (L) 09/17/2023   ALBUMIN  3.1 (L) 09/17/2023   BILITOT 0.9 09/17/2023   ALKPHOS 65 09/17/2023   AST 66 (H) 09/17/2023   ALT 61 (H) 09/17/2023   ANIONGAP 21 (H) 09/17/2023      Greater than 50% was spent in counseling and coordination of care with patient Critical care time: 70 minutes or more  Family Communication: Wife at the bedside  Primary team communication: Plan of care discussed w/ ED team.  Thank you very much for involving us  in the care of your patient.  Author: Elspeth JINNY Masters, MD 09/17/2023 4:00 PM  For on call review [email or web address].

## 2023-09-17 NOTE — ED Triage Notes (Signed)
 Pt arrived via EMS from [facility] d/t AMS. Pt is able to follow commands but slow to respond, hypertensive, needing 2l O2.

## 2023-09-17 NOTE — ED Notes (Signed)
 Date and time results received: 09/17/23 1307  Test: Troponin Critical Value: 1451  Name of Provider Notified: MD Katrinka Blazing

## 2023-09-17 NOTE — ED Notes (Signed)
 Date and time results received: 09/17/23 1256 Test: Lactic Acid Critical Value: 6.3  Name of Provider Notified: MD Katrinka Blazing

## 2023-09-17 NOTE — Consult Note (Addendum)
 PHARMACY - ANTICOAGULATION CONSULT NOTE  Pharmacy Consult for Argatroban  Indication: chest pain/ACS - Suspected HIT  Allergies  Allergen Reactions   Oxycodone  Anaphylaxis    02/25/2022    Patient Measurements: Height: 5' 10 (177.8 cm) Weight: 107.7 kg (237 lb 6.4 oz) IBW/kg (Calculated) : 73 Heparin  Dosing Weight: 96.2 kg  Vital Signs: Temp: 97.5 F (36.4 C) (01/09 1151) Temp Source: Axillary (01/09 1151) BP: 87/53 (01/09 1307) Pulse Rate: 86 (01/09 1307)  Labs: Recent Labs    09/16/23 0000 09/17/23 1218  HGB 7.9* 7.9*  HCT 25* 25.7*  PLT 81* 66*  APTT  --  28  LABPROT  --  15.7*  INR  --  1.2  CREATININE 5.4*  --   TROPONINIHS  --  1,451*   Estimated Creatinine Clearance: 13.6 mL/min (A) (by C-G formula based on SCr of 5.4 mg/dL (A)).  Medical History: Past Medical History:  Diagnosis Date   Arthritis    Chronic kidney disease    Complication of anesthesia    constipation and inability to  urinate happened a few days after cataract surgery    Diabetes (HCC)    type 2    Diverticulitis    GERD (gastroesophageal reflux disease)    Gout    Heart attack (HCC)    Heart disease    Heart murmur    Hyperglycemia    Hyperlipidemia    Hypertension    Osteoporosis    Pneumonia    hx of BOOP followed by Dr Darlean    Sleep apnea    cpapp - setting at 17    Medications:  No anticoagulation per chart review  Assessment: 78 yo male presenting for confusion and lethargy. In Ed, found to have elevated troponin and pharmacy was consulted to initiate heparin  infusion. On presentation, patient had low platelet count of 81, which trended down this AM to 66. Per ED provider, heparin  bolus and infusion started at 1318 today, as patient had concern for ACS, hgb was low but stable, and no signs of bleeding.  Upon recheck of CBC, patient's platelets are now 54. Pharmacy has been consulted to transition to argatroban  out of concern for possible heparin  induced  thrombocytopenia.  Patient does not have a documented history of thrombocytopenia and, of note, patient was hospitalized recently (08/2023) for HCAP and they received heparin  infusion from 12/22 - 12/24 at 1,000 units/hr as well as subcutaneous heparin  injections (5,000 units q8hours) from 12/24-12/29. During that admission, patient had a stable platelet count in mid to low 200s.   Baseline labs: aPTT ordered, hgb 6.9, plt 54, INR 1.2  Goal of Therapy:  aPTT 50-90 seconds Monitor platelets by anticoagulation protocol: Yes   Plan:  Start argatroban  at 0.5 mcg/kg/min  Check aPTT 4 hours after initiation until therapeutic x2 Then check a minimum of daily  Monitor CBC and H&H daily  Alfonso MARLA Buys, PharmD Pharmacy Resident  09/17/2023 1:18 PM _______________________________________________________________  Thank you for involving pharmacy in this patient's care.   Damien Napoleon, PharmD Clinical Pharmacist 09/17/2023 6:07 PM

## 2023-09-17 NOTE — Consult Note (Addendum)
 Hematology/Oncology Consult note [phone] [phone]      Patient Care Team: Tower, Laine LABOR, MD as PCP - General Perla, Evalene PARAS, MD as PCP - Cardiology (Cardiology) Perla Evalene PARAS, MD as Consulting Physician (Cardiology) Darlean Ozell NOVAK, MD as Consulting Physician (Pulmonary Disease) Arloa Larnell POUR., MD as Referring Physician (Dentistry) Fate Morna SAILOR, [facility] (Inactive) as Pharmacist (Pharmacist)   Name of the patient: Kurt Aguilar  [id number]  1946/09/01   REASON FOR COSULTATION:  Thrombocytopenia, requested by ED physician Dr. Claudene Rover.   History of presenting illness-  78 y.o. male with PMH listed at below who were sent to emergency room via EMS from [facility] for evaluation of altered mental status.  Patient is not able to recall any history .  His wife reports that patient has had very poor p.o. intake, mental status declined for the past couple of days and inability to urinate.  He also complains about back pain.  In the emergency room, patient was found to have hypotensive, confusion, AKI on CKD.  Acute thrombocytopenia.  Troponin was 1320. CT abdomen pelvis without contrast showed no evidence of acute intra-abdominal or pelvic abnormality.  Mild lower lobe bronchiectasis.  Peribronchial thickening with patchy foci of consolidations in the lower lobes and right middle lobe, possible multifocal pneumonia.  Polycystic kidneys, moderate fluid distention of stomach.  Aortic atherosclerosis.  Thrombocytopenia, acute onset, patient was recently admitted from 01/28/2023 - 09/06/2023 due to acute hypoxic respiratory failure, secondary to rhinovirus pneumonia with superimposed possible bacterial pneumonia with MRSE.  He was treated with vancomycin  and cefepime  during his hospitalization, switched to Linezolid  to cover MRSE grown from sputum culture.  Upon discharge, patient was prescribed 10 more days of linezolid  to complete total 14 days  course. At discharge, platelet count was normal.   Allergies  Allergen Reactions   Oxycodone  Anaphylaxis    02/25/2022     Patient Active Problem List   Diagnosis Date Noted   Renal failure (ARF), acute on chronic (HCC) 09/17/2023   Acute hypoxic respiratory failure (HCC) 08/30/2023   NSTEMI (non-ST elevated myocardial infarction) (HCC) 08/30/2023   (HFpEF) heart failure with preserved ejection fraction (HCC) 08/30/2023   Tracheobronchomalacia 08/30/2023   Elevated LFTs 08/30/2023   Acute hypoxemic respiratory failure (HCC) 08/20/2023   Muscular chest pain 08/20/2023   Vitamin B12 deficiency 04/22/2023   CKD (chronic kidney disease) stage 4, GFR 15-29 ml/min (HCC) 04/22/2023   Current use of proton pump inhibitor 04/14/2023   Nausea 01/21/2023   Lumbar radiculopathy 01/01/2023   Immunocompromised due to corticosteroids (HCC) 10/09/2022   Severe sepsis (HCC) 08/17/2022   Lumbar pain 01/30/2022   Acute on chronic diastolic (congestive) heart failure (HCC) 10/23/2021   Acute kidney injury superimposed on chronic kidney disease (HCC) 09/17/2021   Stage 3b chronic kidney disease (CKD) (HCC) 09/17/2021   Osteopenia 05/16/2021   Steroid dependent (HCC) 04/12/2021   Primary osteoarthritis of left knee 05/29/2020   Colon cancer screening 02/13/2017   Poorly controlled type 2 diabetes mellitus with circulatory disorder (HCC) 08/13/2016   Chronic heel pain, left 08/12/2016   Upper airway cough syndrome 07/31/2016   Morbid obesity due to excess calories (HCC) 05/19/2016   Encounter for general adult medical examination with abnormal findings 01/27/2016   Pulmonary infiltrates with high  ESR c/w BOOP/ idiopathic  12/19/2015   Hypotension 12/12/2015   Cough 11/29/2015   Cystic kidney disease 10/01/2015   Renal lesion 09/27/2015   Erectile dysfunction of organic origin  09/27/2015   BPH with obstruction/lower urinary tract symptoms 09/27/2015   Constipation 09/24/2015   Cyst of right  kidney 09/24/2015   CAD (coronary artery disease) 06/08/2015   Grieving 11/22/2014   Encounter for Medicare annual wellness exam 07/11/2013   Prostate cancer screening 07/03/2013   Right bundle branch block 02/22/2013   Chronic low back pain 05/13/2011   Obstructive sleep apnea 03/15/2010   Hyperlipidemia associated with type 2 diabetes mellitus (HCC) 04/12/2009   Gout 04/12/2009   Essential hypertension 04/12/2009   Coronary artery disease of bypass graft of native heart with stable angina pectoris (HCC) 04/12/2009   GERD 04/12/2009   Type 2 diabetes mellitus (HCC) 04/12/2009   DIVERTICULITIS, HX OF 04/12/2009     Past Medical History:  Diagnosis Date   Arthritis    Chronic kidney disease    Complication of anesthesia    constipation and inability to  urinate happened a few days after cataract surgery    Diabetes (HCC)    type 2    Diverticulitis    GERD (gastroesophageal reflux disease)    Gout    Heart attack (HCC)    Heart disease    Heart murmur    Hyperglycemia    Hyperlipidemia    Hypertension    Osteoporosis    Pneumonia    hx of BOOP followed by Dr Darlean    Sleep apnea    cpapp - setting at 15      Past Surgical History:  Procedure Laterality Date   ACROMIO-CLAVICULAR JOINT REPAIR Left 05/05/2019   Procedure: SHOULDER ARTHROSCOPIC ASSISTED ACROMIO-CLAVICULAR JOINT REPAIR;  Surgeon: Dozier Soulier, MD;  Location: [facility];  Service: Orthopedics;  Laterality: Left;   CATARACT EXTRACTION Bilateral    CORONARY ARTERY BYPASS GRAFT     [place]    LEFT HEART CATH AND CORS/GRAFTS ANGIOGRAPHY N/A 09/19/2021   Procedure: LEFT HEART CATH AND CORS/GRAFTS ANGIOGRAPHY;  Surgeon: Claudene Victory ORN, MD;  Location: [facility];  Service: Cardiovascular;  Laterality: N/A;   open heart surgery  09/09/1995-1998   5 bypass   REPAIR KNEE LIGAMENT     right   SHOULDER ARTHROSCOPY Left 05/05/2019   Procedure: ARTHROSCOPY SHOULDER;  Surgeon: Dozier Soulier, MD;   Location: [facility];  Service: Orthopedics;  Laterality: Left;   TONSILLECTOMY     TOTAL KNEE ARTHROPLASTY Left 05/29/2020   Procedure: LEFT TOTAL KNEE ARTHROPLASTY;  Surgeon: Sheril Coy, MD;  Location: [facility];  Service: Orthopedics;  Laterality: Left;    Social History   Socioeconomic History   Marital status: Married    Spouse name: Not on file   Number of children: 4   Years of education: 14   Highest education level: Not on file  Occupational History   Occupation: Sports administrator    Employer: RETIRED  Tobacco Use   Smoking status: Former    Current packs/day: 0.00    Average packs/day: 1 pack/day for 3.0 years (3.0 ttl pk-yrs)    Types: Cigars, Cigarettes    Start date: 11/25/2001    Quit date: 11/25/2004    Years since quitting: 18.8   Smokeless tobacco: Never   Tobacco comments:    occas. cigar quit 2006  Vaping Use   Vaping status: Never Used  Substance and Sexual Activity   Alcohol  use: Yes    Alcohol /week: 1.0 standard drink of alcohol     Types: 1 Standard drinks or equivalent per week    Comment: rare   Drug use: No  Sexual activity: Never  Other Topics Concern   Not on file  Social History Narrative   Not on file   Social Drivers of Health   Financial Resource Strain: Low Risk  (04/30/2023)   Overall Financial Resource Strain (CARDIA)    Difficulty of Paying Living Expenses: Not hard at all  Food Insecurity: No Food Insecurity (09/01/2023)   Hunger Vital Sign    Worried About Running Out of Food in the Last Year: Never true    Ran Out of Food in the Last Year: Never true  Transportation Needs: No Transportation Needs (09/01/2023)   PRAPARE - Administrator, Civil Service (Medical): No    Lack of Transportation (Non-Medical): No  Physical Activity: Insufficiently Active (04/30/2023)   Exercise Vital Sign    Days of Exercise per Week: 2 days    Minutes of Exercise per Session: 30 min  Stress: No Stress Concern Present (04/30/2023)    [employer] of Occupational Health - Occupational Stress Questionnaire    Feeling of Stress : Not at all  Social Connections: Socially Integrated (04/30/2023)   Social Connection and Isolation Panel [NHANES]    Frequency of Communication with Friends and Family: More than three times a week    Frequency of Social Gatherings with Friends and Family: More than three times a week    Attends Religious Services: More than 4 times per year    Active Member of [employer] or Organizations: Yes    Attends Engineer, Structural: More than 4 times per year    Marital Status: Married  Catering Manager Violence: Not At Risk (09/01/2023)   Humiliation, Afraid, Rape, and Kick questionnaire    Fear of Current or Ex-Partner: No    Emotionally Abused: No    Physically Abused: No    Sexually Abused: No     Family History  Problem Relation Age of Onset   Heart disease Mother    Heart disease Father    Stroke Father    Heart attack Son 85   Kidney disease Neg Hx    Prostate cancer Neg Hx    Diabetes Neg Hx      Current Facility-Administered Medications:    docusate sodium  (COLACE) capsule 100 mg, 100 mg, Oral, BID PRN, Isadora Hose, MD   heparin  ADULT infusion 100 units/mL (25000 units/250mL), 1,250 Units/hr, Intravenous, Continuous, Geraldine Alfonso POUR, RPH, Last Rate: 12.5 mL/hr at 09/17/23 1410, 1,250 Units/hr at 09/17/23 1410   polyethylene glycol (MIRALAX  / GLYCOLAX ) packet 17 g, 17 g, Oral, Daily PRN, Isadora Hose, MD   sodium bicarbonate  150 mEq in dextrose  5 % 1,150 mL infusion, , Intravenous, Continuous, Dgayli, Khabib, MD  Current Outpatient Medications:    acetaminophen  (TYLENOL ) 500 MG tablet, Take 1,000 mg by mouth every 8 (eight) hours as needed., Disp: , Rfl:    albuterol  (PROVENTIL ) (2.5 MG/3ML) 0.083% nebulizer solution, Take 3 mLs (2.5 mg total) by nebulization 2 (two) times daily. (Patient not taking: Reported on 09/17/2023), Disp: 75 mL, Rfl: 12   allopurinol  (ZYLOPRIM )  100 MG tablet, Take 50 mg by mouth daily. Every 2 days. (Patient not taking: Reported on 09/17/2023), Disp: , Rfl:    aspirin  EC (ASPIRIN  81) 81 MG tablet, Take 81 mg by mouth daily. Swallow whole., Disp: , Rfl:    bisoprolol  (ZEBETA ) 5 MG tablet, Take 0.5 tablets (2.5 mg total) by mouth daily., Disp: 15 tablet, Rfl: 2   Calcium  Carbonate-Vit D-Min (CALCIUM -VITAMIN D -MINERALS) 600-800 MG-UNIT CHEW, Chew 1  tablet by mouth daily., Disp: , Rfl:    Continuous Blood Gluc Receiver (FREESTYLE LIBRE 2 READER) DEVI, USE AS INSTRUCTED TO CHECK BLOOD SUGARS., Disp: 1 each, Rfl: 0   Continuous Blood Gluc Sensor (FREESTYLE LIBRE 2 SENSOR) MISC, 1 Device by Does not apply route every 14 (fourteen) days., Disp: 6 each, Rfl: 3   cyanocobalamin  (VITAMIN B12) 500 MCG tablet, Take 500 mcg by mouth daily., Disp: , Rfl:    ezetimibe  (ZETIA ) 10 MG tablet, TAKE 1 TABLET BY MOUTH EVERY DAY, Disp: 90 tablet, Rfl: 2   famotidine  (PEPCID ) 20 MG tablet, TAKE 1 TABLET BY MOUTH EVERYDAY AT BEDTIME, Disp: 90 tablet, Rfl: 1   gabapentin  (NEURONTIN ) 100 MG capsule, Take 3 capsules (300 mg total) by mouth at bedtime. Reduced from 300 mg. (Patient taking differently: Take 300 mg by mouth at bedtime. Give one capsule by mouth once daily.), Disp: , Rfl:    Glucosamine HCl (GLUCOSAMINE PO), Take 1,500 mg by mouth 2 (two) times daily. , Disp: , Rfl:    glucose blood (FREESTYLE LITE) test strip, 1 each by Other route 4 (four) times daily. E11.65, Disp: 400 strip, Rfl: 0   HYDROcodone -acetaminophen  (NORCO/VICODIN) 5-325 MG tablet, Take 1 tablet by mouth every 6 (six) hours as needed for moderate pain (pain score 4-6) or severe pain (pain score 7-10)., Disp: 30 tablet, Rfl: 0   insulin  isophane & regular human KwikPen (NOVOLIN  70/30 KWIKPEN) (70-30) 100 UNIT/ML KwikPen, Inject 30 Units into the skin daily with breakfast. And pen needles 1/day, Disp: , Rfl:    insulin  lispro (HUMALOG  KWIKPEN) 200 UNIT/ML KwikPen, Inject 10 Units into the skin 2  (two) times daily before a meal. Inject before lunch and dinner., Disp: , Rfl:    Insulin  Pen Needle (BD PEN NEEDLE NANO 2ND GEN) 32G X 4 MM MISC, USE 3 TIMES A DAY, Disp: 300 each, Rfl: 1   ketoconazole (NIZORAL) 2 % cream, Apply 1 Application topically 2 (two) times daily., Disp: , Rfl:    Lancets (FREESTYLE) lancets, 1 EACH BY OTHER ROUTE 4 (FOUR) TIMES DAILY. E11.65, Disp: 400 each, Rfl: 0   linezolid  (ZYVOX ) 600 MG tablet, Take 600 mg by mouth 2 (two) times daily., Disp: , Rfl:    Menthol, Topical Analgesic, (BIOFREEZE COOL THE PAIN) 4 % GEL, Apply to lower back topically four times daily, Disp: , Rfl:    Multiple Vitamins-Minerals (PRESERVISION AREDS 2 PO), Take 1 capsule by mouth daily., Disp: , Rfl:    nitroGLYCERIN  (NITROSTAT ) 0.4 MG SL tablet, Place 1 tablet (0.4 mg total) under the tongue every 5 (five) minutes x 3 doses as needed for chest pain., Disp: 25 tablet, Rfl: 1   omeprazole  (PRILOSEC) 40 MG capsule, TAKE 1 CAPUSLE BY MOUTH 30- 60 MIN BEFORE YOUR FIRST AND LAST MEALS OF THE DAY, Disp: 180 capsule, Rfl: 3   ondansetron  (ZOFRAN ) 8 MG tablet, Take 1 tablet (8 mg total) by mouth every 8 (eight) hours as needed for nausea or vomiting. From narcotic pain medicine, Disp: 15 tablet, Rfl: 0   OXYGEN, 2lpm every shift related to acute respiratory failure with hypoxia., Disp: , Rfl:    polyethylene glycol (MIRALAX  / GLYCOLAX ) 17 g packet, Take 17 g by mouth daily., Disp: , Rfl:    predniSONE  (DELTASONE ) 20 MG tablet, Starting on 09/07/23, take 30 mg prednisone  daily for 7 days, and decrease by 10mg  every 7 days until 10mg  and maintain until seen as outpatient by pulmonologist. (Patient taking differently: Take 10  mg by mouth daily. Give 20mg  by mouth once daily for 6 days, then reduce to 10mg  daily.), Disp: , Rfl:    rosuvastatin  (CRESTOR ) 40 MG tablet, TAKE 1 TABLET BY MOUTH EVERY DAY, Disp: 90 tablet, Rfl: 2   Semaglutide , 2 MG/DOSE, (OZEMPIC , 2 MG/DOSE,) 8 MG/3ML SOPN, Inject 2 mg into the  skin once a week., Disp: 9 mL, Rfl: 3   senna-docusate (SENOKOT-S) 8.6-50 MG tablet, Take 1 tablet by mouth at bedtime., Disp: 30 tablet, Rfl: 2  Review of Systems - Oncology  PHYSICAL EXAM Vitals:   09/17/23 1152 09/17/23 1307 09/17/23 1330 09/17/23 1551  BP:  (!) 87/53 110/74   Pulse:  86 81   Resp:  14 16   Temp:    (!) 97.4 F (36.3 C)  TempSrc:    Axillary  SpO2:  100% 100%   Weight: 237 lb 6.4 oz (107.7 kg)     Height: 5' 10 (1.778 m)      Physical Exam Constitutional:      General: He is not in acute distress.    Appearance: He is obese. He is not diaphoretic.  HENT:     Head: Normocephalic and atraumatic.  Eyes:     General: No scleral icterus. Cardiovascular:     Rate and Rhythm: Normal rate.     Heart sounds: No murmur heard. Pulmonary:     Effort: Pulmonary effort is normal. No respiratory distress.  Abdominal:     General: Bowel sounds are normal. There is no distension.     Palpations: Abdomen is soft.  Musculoskeletal:        General: Normal range of motion.     Cervical back: Normal range of motion and neck supple.  Skin:    General: Skin is warm and dry.     Findings: No erythema.     Comments: Multiple bruising  Neurological:     Mental Status: He is alert.     Motor: No abnormal muscle tone.     Comments: Orientated x 1.  Psychiatric:        Mood and Affect: Affect normal.       LABORATORY STUDIES    Latest Ref Rng & Units 09/17/2023    3:49 PM 09/17/2023   12:18 PM 09/16/2023   12:00 AM  CBC  WBC 4.0 - 10.5 K/uL 8.0  5.8  7.5      Hemoglobin 13.0 - 17.0 g/dL 6.9  7.9  7.9      Hematocrit 39.0 - 52.0 % 21.8  25.7  25      Platelets 150 - 400 K/uL 54  66  81         This result is from an external source.      Latest Ref Rng & Units 09/17/2023   12:18 PM 09/16/2023   12:00 AM 09/05/2023    5:50 AM  CMP  Glucose 70 - 99 mg/dL 861   821   BUN 8 - 23 mg/dL 844  878     78   Creatinine 0.61 - 1.24 mg/dL 2.96  5.4     6.64   Sodium 135 -  145 mmol/L 141  142     139   Potassium 3.5 - 5.1 mmol/L 6.1  5.0     4.4   Chloride 98 - 111 mmol/L 107  108     104   CO2 22 - 32 mmol/L 13  20     23  Calcium  8.9 - 10.3 mg/dL 7.3  7.9     8.7   Total Protein 6.5 - 8.1 g/dL 5.5     Total Bilirubin 0.0 - 1.2 mg/dL 0.9     Alkaline Phos 38 - 126 U/L 65  75       AST 15 - 41 U/L 66  51       ALT 0 - 44 U/L 61  58          This result is from an external source.     RADIOGRAPHIC STUDIES: I have personally reviewed the radiological images as listed and agreed with the findings in the report. CT ABDOMEN PELVIS WO CONTRAST Result Date: 09/17/2023 CLINICAL DATA:  Abdomen pain EXAM: CT ABDOMEN AND PELVIS WITHOUT CONTRAST TECHNIQUE: Multidetector CT imaging of the abdomen and pelvis was performed following the standard protocol without IV contrast. RADIATION DOSE REDUCTION: This exam was performed according to the departmental dose-optimization program which includes automated exposure control, adjustment of the mA and/or kV according to patient size and/or use of iterative reconstruction technique. COMPARISON:  CT 08/17/2022 FINDINGS: Lower chest: Lung bases demonstrate mild bronchiectasis in the lower lobes. Small foci of lower lobe consolidation and peribronchial thickening. Small focus of right middle lobe consolidation. Coronary vascular calcification. Aortic valve calcifications. Borderline to mild cardiomegaly. Hepatobiliary: No calcified gallstone. No biliary dilatation. Hepatic granuloma. Pancreas: Unremarkable. No pancreatic ductal dilatation or surrounding inflammatory changes. Spleen: Numerous calcified granuloma Adrenals/Urinary Tract: Adrenal glands are normal. Polycystic kidneys bilaterally with numerous simple and complex cysts and subcentimeter hyper and hypodensities too small to further characterize, grossly stable as compared with 2023 and further assessment limited without contrast no hydronephrosis. Decompressed urinary bladder by  Foley catheter Stomach/Bowel: Moderate fluid distension of stomach. No evidence for bowel obstruction or bowel wall thickening. Negative appendix. Vascular/Lymphatic: Moderate aortic atherosclerosis. No aneurysm. No suspicious lymph nodes Reproductive: Negative prostate Other: Negative for pelvic effusion or free air. Small fat containing inguinal hernias. Musculoskeletal: No acute or suspicious osseous abnormality. IMPRESSION: 1. No CT evidence for acute intra-abdominal or pelvic abnormality. 2. Mild lower lobe bronchiectasis. Peribronchial thickening with patchy foci of consolidations in the lower lobes and right middle lobe, possible multifocal pneumonia, imaging follow-up to resolution is recommended. 3. Polycystic kidneys. Numerous simple and complex bilateral renal cysts, grossly unchanged, further evaluation limited without contrast. Non emergent/outpatient renal CT or MRI follow-up contrast examination could be performed if desired. 4. Moderate fluid distension of stomach. 5. Aortic atherosclerosis. Aortic Atherosclerosis (ICD10-I70.0). Electronically Signed   By: Luke Bun M.D.   On: 09/17/2023 16:07   CT HEAD WO CONTRAST ( ) Result Date: 09/17/2023 CLINICAL DATA:  Non focal encephalopathy. Evaluate for intracranial hemorrhage or CVA. Acute onset of confusion and lethargy. EXAM: CT HEAD WITHOUT CONTRAST TECHNIQUE: Contiguous axial images were obtained from the base of the skull through the vertex without intravenous contrast. RADIATION DOSE REDUCTION: This exam was performed according to the departmental dose-optimization program which includes automated exposure control, adjustment of the mA and/or kV according to patient size and/or use of iterative reconstruction technique. COMPARISON:  CT head without contrast 11/17/2021. FINDINGS: Brain: Moderate atrophy and white matter changes demonstrate some progression since the prior exam. No acute infarct, hemorrhage, or mass lesion is present. The  ventricles are of normal size. No significant extraaxial fluid collection is present. The brainstem and cerebellum are within normal limits. A relatively empty sella is present. Midline structures are otherwise within normal limits. Vascular: Atherosclerotic calcifications are present within  the cavernous internal carotid arteries bilaterally. No hyperdense vessel is present. Skull: Calvarium is intact. No focal lytic or blastic lesions are present. No significant extracranial soft tissue lesion is present. Sinuses/Orbits: Bilateral lens replacements are noted. Globes and orbits are otherwise unremarkable. The paranasal sinuses and mastoid air cells are clear. IMPRESSION: 1. No acute intracranial abnormality or significant interval change. 2. Moderate atrophy and white matter disease demonstrates some progression since the prior exam. This likely reflects the sequela of chronic microvascular ischemia. Electronically Signed   By: Lonni Necessary M.D.   On: 09/17/2023 14:51   DG Chest Port 1 View Result Date: 09/17/2023 CLINICAL DATA:  Sepsis EXAM: PORTABLE CHEST 1 VIEW COMPARISON:  X-ray 08/30/2023. FINDINGS: Enlarged cardiopericardial silhouette. Sternal wires. Stable widened mediastinum with a calcified aorta. Persistent left pleural effusion but improving. Adjacent mild opacity. Presumed calcification along the left lung apex. No pneumothorax or edema. Overlapping cardiac leads. IMPRESSION: Postop chest.  Enlarged heart. Improving left pleural effusion. Electronically Signed   By: Ranell Bring M.D.   On: 09/17/2023 12:21   US  Venous Img Lower Unilateral Right (DVT) Result Date: 09/05/2023 CLINICAL DATA:  Right leg pain, initial encounter EXAM: RIGHT LOWER EXTREMITY VENOUS DOPPLER ULTRASOUND TECHNIQUE: Gray-scale sonography with graded compression, as well as color Doppler and duplex ultrasound were performed to evaluate the lower extremity deep venous systems from the level of the common femoral vein  and including the common femoral, femoral, profunda femoral, popliteal and calf veins including the posterior tibial, peroneal and gastrocnemius veins when visible. The superficial great saphenous vein was also interrogated. Spectral Doppler was utilized to evaluate flow at rest and with distal augmentation maneuvers in the common femoral, femoral and popliteal veins. COMPARISON:  None Available. FINDINGS: Contralateral Common Femoral Vein: Respiratory phasicity is normal and symmetric with the symptomatic side. No evidence of thrombus. Normal compressibility. Common Femoral Vein: No evidence of thrombus. Normal compressibility, respiratory phasicity and response to augmentation. Saphenofemoral Junction: No evidence of thrombus. Normal compressibility and flow on color Doppler imaging. Profunda Femoral Vein: No evidence of thrombus. Normal compressibility and flow on color Doppler imaging. Femoral Vein: No evidence of thrombus. Normal compressibility, respiratory phasicity and response to augmentation. Popliteal Vein: No evidence of thrombus. Normal compressibility, respiratory phasicity and response to augmentation. Calf Veins: No evidence of thrombus. Normal compressibility and flow on color Doppler imaging. Superficial Great Saphenous Vein: No evidence of thrombus. Normal compressibility. Venous Reflux:  None. Other Findings:  None. IMPRESSION: No evidence of deep venous thrombosis. Electronically Signed   By: Oneil Devonshire M.D.   On: 09/05/2023 19:01   ECHOCARDIOGRAM COMPLETE Result Date: 08/31/2023    ECHOCARDIOGRAM REPORT   Patient Name:   DYAN CREELMAN [facility] Date of Exam: 08/31/2023 Medical Rec #:  [id number]        Height:       70.0 in Accession #:    [id number]       Weight:       258.6 lb Date of Birth:  05-17-1946        BSA:          2.328 m Patient Age:    77 years         BP:           123/73 mmHg Patient Gender: M                HR:           86 bpm. Exam Location:  [facility] Procedure: 2D  Echo, Color  Doppler, Cardiac Doppler and Intracardiac            Opacification Agent Indications:     NSTEMI I21.4  History:         Patient has prior history of Echocardiogram examinations, most                  recent 02/05/2023. Previous Myocardial Infarction, Prior CABG;                  Risk Factors:Hypertension and Dyslipidemia.  Sonographer:     Christopher Furnace Referring Phys:  [id number] CLAY[place]NE BROOM Diagnosing Phys: Deatrice Cage MD IMPRESSIONS  1. Left ventricular ejection fraction, by estimation, is 55 to 60%. The left ventricle has normal function. The left ventricle has no regional wall motion abnormalities. There is mild left ventricular hypertrophy. Left ventricular diastolic parameters are indeterminate.  2. Right ventricular systolic function is normal. The right ventricular size is normal. There is normal pulmonary artery systolic pressure.  3. Left atrial size was moderately dilated.  4. The mitral valve is degenerative. No evidence of mitral valve regurgitation. Mild mitral stenosis. The mean mitral valve gradient is 4.0 mmHg. Severe mitral annular calcification.  5. The aortic valve is calcified. Aortic valve regurgitation is not visualized. Mild to moderate aortic valve stenosis. AV gradient does not seem to be accurate. Stenosis degree is assesed visually. FINDINGS  Left Ventricle: Left ventricular ejection fraction, by estimation, is 55 to 60%. The left ventricle has normal function. The left ventricle has no regional wall motion abnormalities. Definity  contrast agent was given IV to delineate the left ventricular  endocardial borders. The left ventricular internal cavity size was normal in size. There is mild left ventricular hypertrophy. Left ventricular diastolic parameters are indeterminate. Right Ventricle: The right ventricular size is normal. No increase in right ventricular wall thickness. Right ventricular systolic function is normal. There is normal pulmonary artery systolic pressure. The tricuspid  regurgitant velocity is 2.42 m/s, and  with an assumed right atrial pressure of 5 mmHg, the estimated right ventricular systolic pressure is 28.4 mmHg. Left Atrium: Left atrial size was moderately dilated. Right Atrium: Right atrial size was normal in size. Pericardium: There is no evidence of pericardial effusion. Mitral Valve: The mitral valve is degenerative in appearance. Severe mitral annular calcification. No evidence of mitral valve regurgitation. Mild mitral valve stenosis. MV peak gradient, 11.6 mmHg. The mean mitral valve gradient is 4.0 mmHg. Tricuspid Valve: The tricuspid valve is normal in structure. Tricuspid valve regurgitation is mild . No evidence of tricuspid stenosis. Aortic Valve: The aortic valve is calcified. Aortic valve regurgitation is not visualized. Mild to moderate aortic stenosis is present. Aortic valve mean gradient measures 2.0 mmHg. Aortic valve peak gradient measures 3.0 mmHg. Aortic valve area, by VTI measures 3.47 cm. Pulmonic Valve: The pulmonic valve was normal in structure. Pulmonic valve regurgitation is not visualized. No evidence of pulmonic stenosis. Aorta: The aortic root is normal in size and structure. Venous: The inferior vena cava was not well visualized. IAS/Shunts: No atrial level shunt detected by color flow Doppler.  LEFT VENTRICLE PLAX 2D LVIDd:         4.00 cm   Diastology LVIDs:         3.05 cm   LV e' medial:    5.33 cm/s LV PW:         1.10 cm   LV E/e' medial:  22.1 LV IVS:  1.40 cm   LV e' lateral:   7.29 cm/s LVOT diam:     2.00 cm   LV E/e' lateral: 16.2 LV SV:         53 LV SV Index:   23 LVOT Area:     3.14 cm  RIGHT VENTRICLE RV Basal diam:  3.60 cm RV Mid diam:    3.40 cm RV S prime:     9.90 cm/s TAPSE (M-mode): 1.8 cm LEFT ATRIUM             Index        RIGHT ATRIUM           Index LA diam:        4.90 cm 2.10 cm/m   RA Area:     19.50 cm LA Vol (A2C):   81.6 ml 35.05 ml/m  RA Volume:   49.40 ml  21.22 ml/m LA Vol (A4C):   67.5 ml 28.99  ml/m LA Biplane Vol: 73.7 ml 31.66 ml/m  AORTIC VALVE AV Area (Vmax):    3.04 cm AV Area (Vmean):   2.80 cm AV Area (VTI):     3.47 cm AV Vmax:           86.50 cm/s AV Vmean:          63.500 cm/s AV VTI:            0.152 m AV Peak Grad:      3.0 mmHg AV Mean Grad:      2.0 mmHg LVOT Vmax:         83.80 cm/s LVOT Vmean:        56.500 cm/s LVOT VTI:          0.168 m LVOT/AV VTI ratio: 1.11  AORTA Ao Root diam: 3.30 cm MITRAL VALVE                TRICUSPID VALVE MV Area (PHT): 2.68 cm     TR Peak grad:   23.4 mmHg MV Area VTI:   1.38 cm     TR Vmax:        242.00 cm/s MV Peak grad:  11.6 mmHg MV Mean grad:  4.0 mmHg     SHUNTS MV Vmax:       1.70 m/s     Systemic VTI:  0.17 m MV Vmean:      98.8 cm/s    Systemic Diam: 2.00 cm MV Decel Time: 283 msec MV E velocity: 118.00 cm/s MV A velocity: 140.00 cm/s MV E/A ratio:  0.84 Deatrice Cage MD Electronically signed by Deatrice Cage MD Signature Date/Time: 08/31/2023/3:20:37 PM    Final    NM Pulmonary Perfusion Result Date: 08/31/2023 CLINICAL DATA:  Worsening shortness of breath. EXAM: NUCLEAR MEDICINE PERFUSION LUNG SCAN TECHNIQUE: Perfusion images were obtained in multiple projections after intravenous injection of radiopharmaceutical. Ventilation scans intentionally deferred if perfusion scan and chest x-ray adequate for interpretation during COVID 19 epidemic. RADIOPHARMACEUTICALS:  4.28 mCi Tc-36m MAA IV COMPARISON:  Chest CT and radiograph 08/30/2023 FINDINGS: Comparison chest radiograph demonstrates airspace opacities in the left lower lobe. Perfusion imaging demonstrates no segmental or subsegmental perfusion abnormalities. Perfusion defect in the left lower lobe corresponds to airspace opacity seen on prior imaging. IMPRESSION: Nonsegmental perfusion defect correlating to airspace opacities seen on prior imaging. Low probability of pulmonary embolism. Electronically Signed   By: Norman Gatlin M.D.   On: 08/31/2023 10:42   US  Abdomen  Complete Result Date: 08/30/2023 CLINICAL DATA:  Acute kidney injury. Elevated liver function studies. EXAM: ABDOMEN ULTRASOUND COMPLETE COMPARISON:  CT abdomen and pelvis 08/17/2022 FINDINGS: Gallbladder: No gallstones or wall thickening visualized. No sonographic Murphy sign noted by sonographer. Common bile duct: Diameter: 3 mm, normal Liver: Diffusely increased hepatic parenchymal echotexture suggesting fatty infiltration. Subcentimeter cysts. Portal vein is patent on color Doppler imaging with normal direction of blood flow towards the liver. IVC: No abnormality visualized. Pancreas: Visualized portion unremarkable. Spleen: Size and appearance within normal limits. Right Kidney: Length: 15.3 cm. Multiple parenchymal cysts are demonstrated with benign appearance. Largest measures 6.1 cm maximal diameter. Changes are consistent with polycystic kidney. No hydronephrosis. No solid mass identified. No imaging follow-up is indicated. Left Kidney: Length: 12.5 cm. Multiple parenchymal cysts are demonstrated with benign appearance. Largest measures 4.8 cm in maximal diameter. Changes are consistent with polycystic kidney. No hydronephrosis or solid mass identified. No follow-up imaging is indicated. Abdominal aorta: No aneurysm visualized. Other findings: Examination is somewhat technically limited due to body habitus, bowel gas, and limited mobility. IMPRESSION: 1. Diffuse fatty infiltration of the liver. 2. Polycystic kidneys without evidence of obstruction. Electronically Signed   By: Elsie Gravely M.D.   On: 08/30/2023 20:25   CT CHEST WO CONTRAST Result Date: 08/30/2023 CLINICAL DATA:  Recent history of pneumonia/sepsis. Productive cough. EXAM: CT CHEST WITHOUT CONTRAST TECHNIQUE: Multidetector CT imaging of the chest was performed following the standard protocol without IV contrast. RADIATION DOSE REDUCTION: This exam was performed according to the departmental dose-optimization program which includes  automated exposure control, adjustment of the mA and/or kV according to patient size and/or use of iterative reconstruction technique. COMPARISON:  CT chest dated August 22, 2023. FINDINGS: Cardiovascular: Normal heart size. No pericardial effusion. Atherosclerosis of the thoracic aorta and arch branch vessels. Multivessel coronary artery calcifications. Aortic valve and mitral annulus calcifications. Prior CABG. Mediastinum/Nodes: Redemonstration of multiple prominent mediastinal lymph nodes, similar to slightly decreased in size compared to the prior exam. Calcified mediastinal and right hilar granulomatous nodes. Secretions noted in the left mainstem bronchus. Thyroid  gland and esophagus demonstrate no significant abnormality. Lungs/Pleura: Similar small right and trace left pleural effusions. Calcified pleural plaques in the left hemithorax. Patchy bilateral multifocal peripheral predominant consolidation is again noted with increased consolidative changes at the right lung base and the superior aspect of the posterior left lower lobe. No pneumothorax. Upper Abdomen: No acute abnormality. Musculoskeletal: Intact median sternotomy wires. No suspicious osseous lesion. No acute osseous abnormality. IMPRESSION: 1. Multilobar pneumonia with increased airspace consolidation at the right lung base and the posterior left lower lobe. 2. Similar small right and trace left pleural effusions. 3. Additional unchanged ancillary findings, as described above. Aortic Atherosclerosis (ICD10-I70.0). Electronically Signed   By: Harrietta Sherry M.D.   On: 08/30/2023 17:03   DG Chest 2 View Result Date: 08/30/2023 CLINICAL DATA:  Cough and dyspnea. Recent hospitalization for pneumonia/sepsis. EXAM: CHEST - 2 VIEW COMPARISON:  08/22/2023 FINDINGS: Lungs are hypoinflated demonstrate persistent opacification over the left base likely infection with associated small left pleural effusion. Possible small amount right pleural fluid.  Calcified pleural plaques over the lateral left upper lung/apex. Mild stable cardiomegaly. Remainder of the exam is unchanged. IMPRESSION: 1. Persistent opacification over the left base likely infection with associated small left pleural effusion. Possible small amount right pleural fluid. 2. Stable cardiomegaly. Electronically Signed   By: Toribio Agreste M.D.   On: 08/30/2023 13:43   CT Chest High Resolution Result Date: 08/23/2023 CLINICAL DATA:  78 year old male with  history of interstitial lung disease. Shortness of breath. EXAM: CT CHEST WITHOUT CONTRAST TECHNIQUE: Multidetector CT imaging of the chest was performed following the standard protocol without intravenous contrast. High resolution imaging of the lungs, as well as inspiratory and expiratory imaging, was performed. RADIATION DOSE REDUCTION: This exam was performed according to the departmental dose-optimization program which includes automated exposure control, adjustment of the mA and/or kV according to patient size and/or use of iterative reconstruction technique. COMPARISON:  Chest CTA 12/19/2015. FINDINGS: Cardiovascular: Heart size is normal. There is no significant pericardial fluid, thickening or pericardial calcification. There is aortic atherosclerosis, as well as atherosclerosis of the great vessels of the mediastinum and the coronary arteries, including calcified atherosclerotic plaque in the left main, left anterior descending, left circumflex and right coronary arteries. Status post median sternotomy for CABG including LIMA to the LAD. Severe calcifications of the aortic valve and mitral annulus. Mediastinum/Nodes: Multiple prominent borderline enlarged mediastinal and hilar lymph nodes are incidentally noted. No definite pathologically enlarged lymph nodes are identified. Densely calcified subcarinal and right hilar lymph nodes are also incidentally noted. Esophagus is unremarkable in appearance. No axillary lymphadenopathy.  Lungs/Pleura: Small right and trace left pleural effusions lying dependently. Calcified pleural plaques in the left hemithorax. No calcified pleural plaques in the right hemithorax. Patchy multifocal peripheral predominant airspace consolidation throughout the lungs bilaterally, most evident in the dependent portions of the right lower lobe and right upper lobe. High-resolution images also demonstrates some patchy areas of ground-glass attenuation, septal thickening and thickening of the peribronchovascular interstitium, however, today's study is overall limited by motion and the presence of what appears to be acute airspace consolidation, limiting accurate assessment for underlying interstitial lung disease. Inspiratory and expiratory imaging demonstrates severe collapse of the trachea and mainstem bronchi during expiration indicative of tracheobronchomalacia. Upper Abdomen: Partially imaged low-attenuation lesion in the upper left retroperitoneum measuring at least 3 cm in diameter, incompletely characterized on today's noncontrast CT examination, but statistically likely an exophytic cyst in the upper pole of the left kidney (no imaging follow-up recommended). Diffuse low attenuation throughout the visualized hepatic parenchyma, indicative of a background of hepatic steatosis. Musculoskeletal: Median sternotomy wires. There are no aggressive appearing lytic or blastic lesions noted in the visualized portions of the skeleton. IMPRESSION: 1. Limited examination for assessment of interstitial lung disease based on presence of what appears to be multilobar pneumonia, most severe in the right lower lobe. If there is persistent clinical concern for interstitial lung disease (which is likely present in the background on today's study), repeat high-resolution chest CT should be performed after resolution of the patient's acute illness on an outpatient basis. 2. Small right and trace left pleural effusions lying  dependently. 3. Calcified pleural plaques in the left hemithorax suggesting prior left-sided empyema and/or left-sided thoracic trauma. No right-sided calcified pleural plaques are noted to indicate asbestos related pleural disease at this time. 4. Severe tracheobronchomalacia. 5. Aortic atherosclerosis, in addition to left main and three-vessel coronary artery disease. Status post median sternotomy for CABG including LIMA to the LAD. 6. There are calcifications of the aortic valve and mitral annulus. Echocardiographic correlation for evaluation of potential valvular dysfunction may be warranted if clinically indicated. 7. Additional incidental findings, as above. Aortic Atherosclerosis (ICD10-I70.0). Electronically Signed   By: Toribio Aye M.D.   On: 08/23/2023 06:23   DG Chest 1 View Result Date: 08/22/2023 CLINICAL DATA:  Tachypnea EXAM: CHEST  1 VIEW COMPARISON:  Chest x-ray 08/20/2023 FINDINGS: Cardiomediastinal silhouette is enlarged,  unchanged. There is a stable small left pleural effusion with patchy left basilar opacities. Left-sided calcified pleural plaques are again noted. There is no pneumothorax or acute fracture. Sternotomy wires are present. IMPRESSION: 1. Stable small left pleural effusion with patchy left basilar opacities. 2. Left-sided calcified pleural plaques. Electronically Signed   By: Greig Pique M.D.   On: 08/22/2023 19:08   DG Chest Port 1 View Result Date: 08/20/2023 CLINICAL DATA:  Questionable sepsis - evaluate for abnormality. EXAM: PORTABLE CHEST 1 VIEW COMPARISON:  06/19/2023. FINDINGS: Low lung volume. There is new left basilar opacity with probable trace left pleural effusion. There is minimal blunting of right lateral costophrenic angle as well, which may represent trace right pleural effusion. Bilateral lungs are otherwise clear. Redemonstration of left lateral pleural calcification, nonspecific but commonly seen with asbestos related disease. No pneumothorax on  either side. No pulmonary edema. Stable cardio-mediastinal silhouette. No acute osseous abnormalities. The soft tissues are within normal limits. IMPRESSION: *New left basilar opacity, which may represent combination of atelectasis and/or pneumonia. There is associated trace left pleural effusion. Follow up to clearing is recommended. *Probable new trace right pleural effusion.  No pulmonary edema. Electronically Signed   By: Ree Molt M.D.   On: 08/20/2023 13:21   DG Bone Density Result Date: 07/15/2023 EXAM: DUAL X-RAY ABSORPTIOMETRY (DXA) FOR BONE MINERAL DENSITY IMPRESSION: Your patient Roosevelt Eimers completed a BMD test on 07/15/2023 using the [employer] iDXA DXA System (software version: 14.10) manufactured by [employer]. The following summarizes the results of our evaluation. Technologist:VLM PATIENT BIOGRAPHICAL: Name: Sanders, Manninen Patient ID: [id number] Birth Date: 09-10-1945 Height: 70.5 in. Gender: Male Exam Date: 07/15/2023 Weight: 253.2 lbs. Indications: Caucasian, COPD, Corticosteroid, Diabetic, Kidney Disease, Glucocorticoids (Chronic), History of Fracture (Adult) Fractures: Shoulder Treatments: Calcium , Omeprazole , pepcid , Prednisone , Vitamin D  DENSITOMETRY RESULTS: Site         Region     Measured Date Measured Age WHO Classification Young Adult T-score BMD         %Change vs. Previous Significant Change (*) DualFemur Neck Right 07/15/20[age].8 Osteopenia -1.7 0.852 g/cm2 -7.8% Yes DualFemur Neck Right 05/16/2021 77.6 Osteopenia -1.1 0.924 g/cm2 - - DualFemur Total Mean 07/15/2023 79.8 Osteopenia -1.2 0.931 g/cm2 -4.0% Yes DualFemur Total Mean 05/16/2021 77.6 Normal -0.9 0.970 g/cm2 - - Left Forearm Radius 33% 07/15/2023 79.8 Normal 1.6 1.150 g/cm2 - - ASSESSMENT: The BMD measured at Femur Neck Left is 0.874 g/cm2 with a T-score of -1.5. This paitent is considered to be osteopenic according World Health Organization [facility]) criteria. Lumbar spine was not utilized due to advanced  degenerative changes. Compared with prior study, there has been significant decrease in the total hip. The scan quality is good. World Science Writer [facility]) criteria for post-menopausal, Caucasian Women: Normal:                   T-score at or above -1 SD Osteopenia/low bone mass: T-score between -1 and -2.5 SD Osteoporosis:             T-score at or below -2.5 SD RECOMMENDATIONS: 1. All patients should optimize calcium  and vitamin D  intake. 2. Consider FDA-approved medical therapies in postmenopausal women and men aged 28 years and older, based on the following: a. A hip or vertebral(clinical or morphometric) fracture b. T-score < -2.5 at the femoral neck or spine after appropriate evaluation to exclude secondary causes c. Low bone mass (T-score between -1.0 and -2.5 at the femoral neck or spine) and  a 10-year probability of a hip fracture > 3% or a 10-year probability of a major osteoporosis-related fracture > 20% based on the [place] -adapted WHO algorithm 3. Clinician judgment and/or patient preferences may indicate treatment for people with 10-year fracture probabilities above or below these levels FOLLOW-UP: People with diagnosed cases of osteoporosis or osteopenia should be regularly tested for bone mineral density. For patients eligible for Medicare, routine testing is allowed once every 2 years. The testing frequency can be increased to one year for patients who have rapidly progressing disease, or for those who are receiving medical therapy to restore bone mass. I have reviewed this report, and agree with the above findings. [facility] Dear LAINE DELENA BALLS, Your patient SAAHIR PRUDE completed a FRAX assessment on 07/15/2023 using the [employer] iDXA DXA System (analysis version: 14.10) manufactured by [employer]. The following summarizes the results of our evaluation. PATIENT BIOGRAPHICAL: Name: Angus, Amini Patient ID: [id number] Birth Date: 08-Aug-1946 Height:    70.5 in. Gender:      [age]      [age]       Weight:    253.2 lbs. Ethnicity:  White                            Exam Date: 07/15/2023 FRAX* RESULTS:  (version: 3.5) 10-year Probability of Fracture1 Major Osteoporotic Fracture2 Hip Fracture 15.1% 5.3% Population: [place]  (Caucasian) Risk Factors: Glucocorticoids (Chronic), History of Fracture (Adult) Based on Femur (Right) Neck BMD 1 -The 10-year probability of fracture may be lower than reported if the patient has received treatment. 2 -Major Osteoporotic Fracture: Clinical Spine, Forearm, Hip or Shoulder *FRAX is a armed forces logistics/support/administrative officer of the [employer] of [employer] for Metabolic Bone Disease, a World Science Writer (WHO) [employer]. ASSESSMENT: The probability of a major osteoporotic fracture is 15.1% within the next ten years. The probability of a hip fracture is 5.3% within the next ten years. . Electronically Signed   By: Norman Hopper M.D.   On: 07/15/2023 09:28     Assessment and plan-   # Acute thrombocytopenia Peripheral smear showed no schistocytes, he has normal bilirubin, no reticulocytosis.  Less likely MAHA Agree with checking vitamin B12, folate.  Haptoglobin, hepatitis panel Suspect thrombocytopenia is secondary to recent use of linezolid .  Other differential includes HIT, marrow suppression due to shock/infection.  4T score showed intermediate risk.  Heparin  has been held, on argatroban   Check HIT panel.  # AKI on CKD, likely secondary to obstructive uropathy/ATN Appreciate nephrology input # Hyperkalemia secondary to AKI. Managed by primary team and ICU  # Elevated troponin, appreciate cardiology input. # Macrocytic anemia Normal folate.  B12 is pending.  No iron deficiency Likely due to worsening of kidney function. Check SPEP PRBC transfusion to keep Hb>=7   # Shock Cardiogenic shock versus septic shock. Follow-up blood cultures and infectious workup. On empiric antibiotics for possible multifocal  pneumonia   Thank you for allowing me to participate in the care of this patient.   Zelphia Cap, MD, PhD Hematology Oncology 09/17/2023

## 2023-09-17 NOTE — Consult Note (Signed)
 Pharmacy Antibiotic Note  MAXIM BEDEL is a 78 y.o. male admitted on 09/17/2023 with possible sepsis. PMH is significant for hypertension, diabetes, hyperlipidemia, gout, CAD status post CABG in 1997, GERD, polycystic kidney disease, and chronic kidney disease stage IV. Pharmacy has been consulted for Zosyn  dosing.  Today, 09/17/2023 Day 1 of Zosyn  WBC WNL Afebrile  Renal function worsening; Scr 7.03 today with estimated CrCl of 10.5 mL/min  Baseline creatinine ~ 2.2  Lactate 4.7 PCT 0.64 1/9 CXR shows improving left pleural effusion UA unremarkable   Plan: Start Zosyn  2.25 gm IV Q6H given current renal function  Pharmacy will continue to monitor and dose adjust appropriately   Height: 5' 10 (177.8 cm) Weight: 107.7 kg (237 lb 6.4 oz) IBW/kg (Calculated) : 73  Temp (24hrs), Avg:97.6 F (36.4 C), Min:97.1 F (36.2 C), Max:98.6 F (37 C)  Recent Labs  Lab 09/16/23 0000 09/17/23 1218 09/17/23 1408 09/17/23 1549  WBC 7.5 5.8  --  8.0  CREATININE 5.4* 7.03*  --   --   LATICACIDVEN  --  6.3* 4.7*  --     Estimated Creatinine Clearance: 10.5 mL/min (A) (by C-G formula based on SCr of 7.03 mg/dL (H)).    Allergies  Allergen Reactions   Oxycodone  Anaphylaxis    02/25/2022    Antimicrobials this admission: Zosyn  1/9 >>   Dose adjustments this admission:  Microbiology results: 1/9 BCx: sent 1/9 UCx: sent   1/9 Resp panel: negative 1/9 MRSA PCR: sent  Thank you for allowing pharmacy to be a part of this patient's care.  Evonnie Nieves, PharmD Pharmacy Resident  09/17/2023 5:51 PM

## 2023-09-17 NOTE — H&P (Signed)
 NAME:  Kurt Aguilar, MRN:  [id number], DOB:  1946/03/04, LOS: 0 ADMISSION DATE:  09/17/2023, CONSULTATION DATE: 09/17/2023 REFERRING MD: Dr. Claudene, CHIEF COMPLAINT: AMS   History of Present Illness:  This is a 78 yo male with a PMH CAD s/p CABG in 1997, moderate mitral valve stenosis, severe tracheobronchomalacia, CKD stage IV, type 2 diabetes mellitus, HTN, HLD, and OSA on CPAP.  He presented to [facility] ER on 01/09 via EMS from [facility] with altered mental status.  Pts wife at bedside reports the pt has had poor po intake over the past 2 days and an inability to urinate.  He also has had back spasms along with twitching movements.  Per outpatient facility documentation pts recent blood work results were abnormal due to creatinine increasing from 3.35 to 5.35.  He has had 2 hospitalizations in December 2024 with acute hypoxic respiratory failure secondary to pneumonia with MRSE, rhinovirus, elevated troponin's, acute kidney injury superimposed on CKD stage IV, and tracheobronchomalacia.  ED Course Upon arrival to the ER pt hypotensive.  Significant lab results were: K+ 6.1/CO2 13/glucose 138/BUN 155/creatinine 7.03/calcium  7.3/anion gap 21/albumin  3.1/AST 66/ALT 61/BNP 320.1/troponin 1,451/lactic acid 6.3/pct 0.64/hgb 7.9/platelet count 66.  EKG revealed sinus rhythm with RBBB which is not a new finding, but no ST elevation.  CXR and Resp panel by RT-PCR negative.  CT Abd Pelvis negative for acute intra-abdominal or pelvic abnormality, although concerning for multifocal pneumonia.  Pt received 100 mg of iv solucortef, 10 units of iv insulin , 1 amp of D50W, 10 g of lokelma , 2L LR bolus, and heparin  bolus followed by heparin  gtt.   Despite aggressive iv fluid resuscitation bp readings remained soft and PCCM team contacted for ICU admission.   CT Abd Pelvis WO Contrast: No CT evidence for acute intra-abdominal or pelvic abnormality. Mild lower lobe bronchiectasis. Peribronchial thickening with patchy foci  of consolidations in the lower lobes and right middle lobe, possible multifocal pneumonia, imaging follow-up to resolution is recommended. Polycystic kidneys. Numerous simple and complex bilateral renal cysts, grossly unchanged, further evaluation limited without contrast. Non emergent/outpatient renal CT or MRI follow-up contrast examination could be performed if desired. Moderate fluid distension of stomach. Aortic atherosclerosis.   CT Head: No acute intracranial abnormality or significant interval change. Moderate atrophy and white matter disease demonstrates some progression since the prior exam. This likely reflects the sequela of chronic microvascular ischemia.   Pertinent  Medical History  CAD s/p CABG in 1997 Moderate mitral valve stenosis Severe tracheobronchomalacia CKD stage IV Type II diabetes mellitus HTN HLD OSA on CPAP  Micro Data:   Resp panel by RT-PCR 01/9>>negative  Blood x2 01/9>> Aspergillus Ag. BAL/Serum 01/9>> Urine 01/9>> MRSA PCR 01/9>>  Significant Hospital Events: Including procedures, antibiotic start and stop dates in addition to other pertinent events   01/9: Admitted to ICU with hypotension multifactorial due to possible adrenal insufficiency/septic shock, and hypovolemia, acute kidney injury superimposed on CKD stage IV with hyperkalemia, lactic and metabolic acidosis, elevated troponin secondary to demand ischemia vs. NSTEMI, macrocytic anemia, and thrombocytopenia      Interim History / Subjective:  Pt not requiring vasopressors at this time map 65 or higher.  Mentation slowly improving   Objective   Blood pressure 110/74, pulse 81, temperature (!) 97.4 F (36.3 C), temperature source Axillary, resp. rate 16, height 5' 10 (1.778 m), weight 107.7 kg, SpO2 100%.       No intake or output data in the 24 hours ending 09/17/23 1616  Filed Weights   09/17/23 1152  Weight: 107.7 kg    Examination: General: Acute on chronically-ill appearing male,  NAD on 2L O2 via nasal canula  HENT: Supple, no JVD  Lungs: Clear throughout, even, non labored  Cardiovascular: Sinus rhythm with RBBB, no m/r/g, trace bilateral upper extremity edema   Abdomen: +BS x4, obese, non distended, non tender  Extremities: Moves all extremities  Neuro: Lethargic but oriented and able to follow commands, PERRLA  Skin: Bilateral upper extremity ecchymosis, multiple bilateral upper extremity skin tears, blanchable sacral spine erythema, RLQ ecchymosis, right calf ecchymosis present on admission  GU: Indwelling foley catheter draining hazy yellow urine with sediment   Resolved Hospital Problem list    Assessment & Plan:   #Acute toxic metabolic encephalopathy~improving - Correct metabolic derangements - Avoid sedating medications as able  - Maintain sleep/wake cycle   #Tracheobronchomalacia  Hx: OSA and pneumonia with MRSE  - Supplemental O2 for dyspnea and/or hypoxia  - Maintain O2 sats 92% or higher  - CPAP at bedtime   #Hypotension multifactorial: hypovolemia, possible adrenal insufficiency and/or sepsis  #Elevated troponin secondary to demand ischemia vs. NSTEMI  #HFpEF  Hx: CAD s/p CABG in 1997, HTN, HLD, moderate mitral valve stenosis, and heart murmur  Echo 08/31/23: EF 55 to 60%, mild mitral valve stenosis, mild to moderate aortic valve stenosis  - Continuous telemetry monitoring  - Trend troponin's until peaked  - Prn levophed  gtt to maintain map 65 or higher - Stress dose steroids  - TSH and free T3 pending  - Cardiology consulted appreciate input  - BNP: 320.1 will defer diuretic therapy for now due to hypotension and worsening renal function  - Hold heparin  gtt for now until and will repeat CBC at 2000; if platelet count and/or hgb continue to decrease will discontinue heparin  gtt   #Acute kidney injury superimposed on CKD stage IV secondary to obstructive uropathy due to urinary retention and ATN  #Hyperkalemia  #Lactic and metabolic  acidosis  #Polycystic kidney per CT Abd Pelvis  - Stat BMP   - Trend lactic acid  - Replace electrolytes as indicated - Strict intake/output  - Nephrology consulted appreciate input - Sodium bicarb gtt   #Transaminitis suspect secondary to shock liver  - Trend hepatic function panel  - Avoid hepatoxic medications  - Acute hepatitis panel pending   #Macrocytic anemia  #Thrombocytopenia Smear review results 10/07/23 normal platelet morphology   - Trend CBC  - Monitor for s/sx of bleeding  - Transfuse for hgb <7 - Anemia panel pending  - Hematology consulted by EDP low suspicion for TTP   #Infectious  - Trend WBC and monitor fever curve - Trend PCT - Follow cultures  - Will start empiric zosyn  for now pending culture results and sensitivities   #Blanchable sacral spine erythema  - Turn q2hrs   #GERD - IV pepcid    #Type II diabetes mellitus  - Hemoglobin A1c pending  - CBG's q4hrs - SSI   Best Practice (right click and Reselect all SmartList Selections daily)   Diet/type: NPO pending bedside swallow evaluation  DVT prophylaxis systemic heparin  on hold for now pending repeat CBC results  Pressure ulcer(s): Blanchable sacral spine erythema present on admission  GI prophylaxis: H2B Lines: N/A Foley:  Yes, and it is still needed Code Status:  DNR Last date of multidisciplinary goals of care discussion [N/A]  01/9: Pt and pts spouse updated by ICU Intensivist Dr. Isadora at bedside regarding pts condition and current  plan of care.  Pt with multiorgan failure and high risk for Cardiac Arrest and Sudden Death. Discussed code status with pt and pts spouse stated pt is a DNR/DNI, and they WOULD NOT want to proceed with any form of dialysis if deemed necessary. All questions were answered.  Will consult palliative care to assist with goals of treatment   Labs   CBC: Recent Labs  Lab 09/16/23 0000 09/17/23 1218  WBC 7.5 5.8  NEUTROABS 6,210.00 4.9  HGB 7.9* 7.9*  HCT  25* 25.7*  MCV  --  104.0*  PLT 81* 66*    Basic Metabolic Panel: Recent Labs  Lab 09/16/23 0000 09/17/23 1218  NA 142 141  K 5.0 6.1*  CL 108 107  CO2 20 13*  GLUCOSE  --  138*  BUN 121* 155*  CREATININE 5.4* 7.03*  CALCIUM  7.9* 7.3*   GFR: Estimated Creatinine Clearance: 10.5 mL/min (A) (by C-G formula based on SCr of 7.03 mg/dL (H)). Recent Labs  Lab 09/16/23 0000 09/17/23 1218 09/17/23 1408  PROCALCITON  --  0.64  --   WBC 7.5 5.8  --   LATICACIDVEN  --  6.3* 4.7*    Liver Function Tests: Recent Labs  Lab 09/16/23 0000 09/17/23 1218  AST 51* 66*  ALT 58* 61*  ALKPHOS 75 65  BILITOT  --  0.9  PROT  --  5.5*  ALBUMIN  3.6 3.1*   No results for input(s): LIPASE, AMYLASE in the last 168 hours. No results for input(s): AMMONIA in the last 168 hours.  ABG    Component Value Date/Time   HCO3 14.2 (L) 09/17/2023 1532   TCO2 20 (L) 09/17/2021 1856   ACIDBASEDEF 13.4 (H) 09/17/2023 1532   O2SAT 23.6 09/17/2023 1532     Coagulation Profile: Recent Labs  Lab 09/17/23 1218  INR 1.2    Cardiac Enzymes: No results for input(s): CKTOTAL, CKMB, CKMBINDEX, TROPONINI in the last 168 hours.  HbA1C: Hemoglobin A1C  Date/Time Value Ref Range Status  09/16/2023 12:00 AM 8.2  Final  05/08/2023 11:11 AM 6.3 (A) 4.0 - 5.6 % Final  09/05/2022 01:26 PM 7.9 (A) 4.0 - 5.6 % Final   Hgb A1c MFr Bld  Date/Time Value Ref Range Status  05/17/2020 02:44 PM 6.6 (H) 4.8 - 5.6 % Final    Comment:    (NOTE) Pre diabetes:          5.7%-6.4%  Diabetes:              >6.4%  Glycemic control for   <7.0% adults with diabetes   03/24/2019 09:25 AM 6.3 4.6 - 6.5 % Final    Comment:    Glycemic Control Guidelines for People with Diabetes:Non Diabetic:  <6%Goal of Therapy: <7%Additional Action Suggested:  >8%     CBG: Recent Labs  Lab 09/17/23 1356 09/17/23 1530  GLUCAP 102* 108*    Review of Systems: Positives in BOLD   Gen: poor po intake, fever,  chills, weight change, fatigue, night sweats HEENT: Denies blurred vision, double vision, hearing loss, tinnitus, sinus congestion, rhinorrhea, sore throat, neck stiffness, dysphagia PULM: Denies shortness of breath, cough, sputum production, hemoptysis, wheezing CV: Denies chest pain, edema, orthopnea, paroxysmal nocturnal dyspnea, palpitations GI: Denies abdominal pain, nausea, vomiting, diarrhea, hematochezia, melena, constipation, change in bowel habits GU: dysuria, hematuria, polyuria, oliguria, urethral discharge Endocrine: Denies hot or cold intolerance, polyuria, polyphagia or appetite change Derm: Denies rash, dry skin, scaling or peeling skin change Heme: Denies easy bruising,  bleeding, bleeding gums Neuro: altered mental status, headache, numbness, weakness, slurred speech, loss of memory or consciousness  Past Medical History:  He,  has a past medical history of Arthritis, Chronic kidney disease, Complication of anesthesia, Diabetes (HCC), Diverticulitis, GERD (gastroesophageal reflux disease), Gout, Heart attack (HCC), Heart disease, Heart murmur, Hyperglycemia, Hyperlipidemia, Hypertension, Osteoporosis, Pneumonia, and Sleep apnea.   Surgical History:   Past Surgical History:  Procedure Laterality Date   ACROMIO-CLAVICULAR JOINT REPAIR Left 05/05/2019   Procedure: SHOULDER ARTHROSCOPIC ASSISTED ACROMIO-CLAVICULAR JOINT REPAIR;  Surgeon: Dozier Soulier, MD;  Location: [facility];  Service: Orthopedics;  Laterality: Left;   CATARACT EXTRACTION Bilateral    CORONARY ARTERY BYPASS GRAFT     [place]    LEFT HEART CATH AND CORS/GRAFTS ANGIOGRAPHY N/A 09/19/2021   Procedure: LEFT HEART CATH AND CORS/GRAFTS ANGIOGRAPHY;  Surgeon: Claudene Victory ORN, MD;  Location: [facility];  Service: Cardiovascular;  Laterality: N/A;   open heart surgery  09/09/1995-1998   5 bypass   REPAIR KNEE LIGAMENT     right   SHOULDER ARTHROSCOPY Left 05/05/2019   Procedure: ARTHROSCOPY SHOULDER;   Surgeon: Dozier Soulier, MD;  Location: [facility];  Service: Orthopedics;  Laterality: Left;   TONSILLECTOMY     TOTAL KNEE ARTHROPLASTY Left 05/29/2020   Procedure: LEFT TOTAL KNEE ARTHROPLASTY;  Surgeon: Sheril Coy, MD;  Location: [facility];  Service: Orthopedics;  Laterality: Left;     Social History:   reports that he quit smoking about 18 years ago. His smoking use included cigars and cigarettes. He started smoking about 21 years ago. He has a 3 pack-year smoking history. He has never used smokeless tobacco. He reports current alcohol  use of about 1.0 standard drink of alcohol  per week. He reports that he does not use drugs.   Family History:  His family history includes Heart attack (age of onset: 21) in his son; Heart disease in his father and mother; Stroke in his father. There is no history of Kidney disease, Prostate cancer, or Diabetes.   Allergies Allergies  Allergen Reactions   Oxycodone  Anaphylaxis    02/25/2022      Home Medications  Prior to Admission medications   Medication Sig Start Date End Date Taking? Authorizing Provider  acetaminophen  (TYLENOL ) 500 MG tablet Take 1,000 mg by mouth every 8 (eight) hours as needed.    [provider]  albuterol  (PROVENTIL ) (2.5 MG/3ML) 0.083% nebulizer solution Take 3 mLs (2.5 mg total) by nebulization 2 (two) times daily. Patient not taking: Reported on 09/17/2023 08/28/23   Darci Pore, MD  allopurinol  (ZYLOPRIM ) 100 MG tablet Take 50 mg by mouth daily. Every 2 days. Patient not taking: Reported on 09/17/2023    [provider]  aspirin  EC (ASPIRIN  81) 81 MG tablet Take 81 mg by mouth daily. Swallow whole.    [provider]  bisoprolol  (ZEBETA ) 5 MG tablet Take 0.5 tablets (2.5 mg total) by mouth daily. 08/29/23   Darci Pore, MD  Calcium  Carbonate-Vit D-Min (CALCIUM -VITAMIN D -MINERALS) 600-800 MG-UNIT CHEW Chew 1 tablet by mouth daily.    [provider]  Continuous Blood Gluc  Receiver (FREESTYLE LIBRE 2 READER) DEVI USE AS INSTRUCTED TO CHECK BLOOD SUGARS. 01/13/22   Kassie Mallick, MD  Continuous Blood Gluc Sensor (FREESTYLE LIBRE 2 SENSOR) MISC 1 Device by Does not apply route every 14 (fourteen) days. 10/31/22   Trixie File, MD  cyanocobalamin  (VITAMIN B12) 500 MCG tablet Take 500 mcg by mouth daily.    [provider]  ezetimibe  (ZETIA ) 10 MG tablet TAKE 1 TABLET BY MOUTH EVERY DAY 07/08/23   Tower, Laine LABOR, MD  famotidine  (PEPCID ) 20 MG tablet TAKE 1 TABLET BY MOUTH EVERYDAY AT BEDTIME 04/10/23   Darlean Ozell NOVAK, MD  gabapentin  (NEURONTIN ) 100 MG capsule Take 3 capsules (300 mg total) by mouth at bedtime. Reduced from 300 mg. Patient taking differently: Take 300 mg by mouth at bedtime. Give one capsule by mouth once daily. 09/14/23   Abdul Fine, MD  Glucosamine HCl (GLUCOSAMINE PO) Take 1,500 mg by mouth 2 (two) times daily.     [provider]  glucose blood (FREESTYLE LITE) test strip 1 each by Other route 4 (four) times daily. E11.65 04/10/20   Kassie Mallick, MD  HYDROcodone -acetaminophen  (NORCO/VICODIN) 5-325 MG tablet Take 1 tablet by mouth every 6 (six) hours as needed for moderate pain (pain score 4-6) or severe pain (pain score 7-10). 09/10/23   Caro Harlene POUR, NP  insulin  isophane & regular human KwikPen (NOVOLIN  70/30 KWIKPEN) (70-30) 100 UNIT/ML KwikPen Inject 30 Units into the skin daily with breakfast. And pen needles 1/day 05/08/23   Trixie File, MD  insulin  lispro (HUMALOG  KWIKPEN) 200 UNIT/ML KwikPen Inject 10 Units into the skin 2 (two) times daily before a meal. Inject before lunch and dinner. 09/06/23   Awanda City, MD  Insulin  Pen Needle (BD PEN NEEDLE NANO 2ND GEN) 32G X 4 MM MISC USE 3 TIMES A DAY 01/25/23   Trixie File, MD  ketoconazole (NIZORAL) 2 % cream Apply 1 Application topically 2 (two) times daily. 11/10/22   [provider]  Lancets (FREESTYLE) lancets 1 EACH BY OTHER ROUTE 4 (FOUR) TIMES DAILY.  E11.65 08/23/20   Kassie Mallick, MD  linezolid  (ZYVOX ) 600 MG tablet Take 600 mg by mouth 2 (two) times daily.    [provider]  Menthol, Topical Analgesic, (BIOFREEZE COOL THE PAIN) 4 % GEL Apply to lower back topically four times daily    [provider]  Multiple Vitamins-Minerals (PRESERVISION AREDS 2 PO) Take 1 capsule by mouth daily.    [provider]  nitroGLYCERIN  (NITROSTAT ) 0.4 MG SL tablet Place 1 tablet (0.4 mg total) under the tongue every 5 (five) minutes x 3 doses as needed for chest pain. 01/19/23   Gollan, Timothy J, MD  omeprazole  (PRILOSEC) 40 MG capsule TAKE 1 CAPUSLE BY MOUTH 30- 60 MIN BEFORE YOUR FIRST AND LAST MEALS OF THE DAY 10/31/22   Darlean Ozell NOVAK, MD  ondansetron  (ZOFRAN ) 8 MG tablet Take 1 tablet (8 mg total) by mouth every 8 (eight) hours as needed for nausea or vomiting. From narcotic pain medicine 01/21/23   Tower, Marne A, MD  OXYGEN 2lpm every shift related to acute respiratory failure with hypoxia.    [provider]  polyethylene glycol (MIRALAX  / GLYCOLAX ) 17 g packet Take 17 g by mouth daily.    [provider]  predniSONE  (DELTASONE ) 20 MG tablet Starting on 09/07/23, take 30 mg prednisone  daily for 7 days, and decrease by 10mg  every 7 days until 10mg  and maintain until seen as outpatient by pulmonologist. Patient taking differently: Take 10 mg by mouth daily. Give 20mg  by mouth once daily for 6 days, then reduce to 10mg  daily. 09/06/23   Awanda City, MD  rosuvastatin  (CRESTOR ) 40 MG tablet TAKE 1 TABLET BY MOUTH EVERY DAY 11/18/22   Tower, Laine LABOR, MD  Semaglutide , 2 MG/DOSE, (OZEMPIC , 2 MG/DOSE,) 8 MG/3ML SOPN Inject 2 mg into the skin  once a week. 05/08/23   Trixie File, MD  senna-docusate (SENOKOT-S) 8.6-50 MG tablet Take 1 tablet by mouth at bedtime. 08/28/23   Darci Pore, MD     Critical care time: 70 minutes      Lonell Moose, AGNP  Pulmonary/Critical Care Pager [phone] (please enter 7  digits) PCCM Consult Pager [phone] (please enter 7 digits)

## 2023-09-17 NOTE — ED Provider Notes (Addendum)
 [facility] Provider Note    Event Date/Time   First MD Initiated Contact with Patient 09/17/23 1144     (approximate)   History   Altered Mental Status   HPI  Kurt Aguilar is a 78 y.o. male who presents to the ED for evaluation of Altered Mental Status   I review a medical DC summary from 12/29.  Admitted for hypoxic respiratory failure and diagnosed with HCAP, pleural effusion.  History of tracheobronchomalacia ,CHF, CAD s/p remote CABG, CKD 4, HTN and DM.  Chronically steroid-dependent.  OSA on CPAP. Also review some nursing home documentation of the past few days.  Severe back spasms and started on hydrocodone  and Zanaflex. Note from earlier today indicates he had worsening shortness of breath, and AKI on recent labs and lethargy so they sent him to us .  Here in the ED, patient reports he needs to void.  Reports chronic back pain.  He knows why he is here and reports a worsening kidney number.  Reports no recent falls or injuries.  Weight history obtained from his wife she reports nonfocal symptoms for couple days that he will not eat or drink.  Periods of generalized confusion.   Physical Exam   Triage Vital Signs: ED Triage Vitals  Encounter Vitals Group     BP 09/17/23 1133 (!) 82/62     Systolic BP Percentile --      Diastolic BP Percentile --      Pulse Rate 09/17/23 1133 (!) 58     Resp 09/17/23 1133 (!) 22     Temp 09/17/23 1133 97.6 F (36.4 C)     Temp Source 09/17/23 1133 Axillary     SpO2 09/17/23 1133 98 %     Weight --      Height --      Head Circumference --      Peak Flow --      Pain Score 09/17/23 1134 0     Pain Loc --      Pain Education --      Exclude from Growth Chart --     Most recent vital signs: Vitals:   09/17/23 1330 09/17/23 1551  BP: 110/74   Pulse: 81   Resp: 16   Temp:  (!) 97.4 F (36.3 C)  SpO2: 100%     General: Awake, no distress.  Chronically ill-appearing CV:  Good peripheral  perfusion.  Resp:  Normal effort.  Abd:  No distention.  Covered in bruises from subcutaneous injections across his lower abdomen.  Fullness to the suprapubic abdomen and some mild tenderness.  Benign upper abdomen. MSK:  No deformity noted.  Neuro:  No focal deficits appreciated. Other:     ED Results / Procedures / Treatments   Labs (all labs ordered are listed, but only abnormal results are displayed) Labs Reviewed  LACTIC ACID, PLASMA - Abnormal; Notable for the following components:      Result Value   Lactic Acid, Venous 6.3 (*)    All other components within normal limits  LACTIC ACID, PLASMA - Abnormal; Notable for the following components:   Lactic Acid, Venous 4.7 (*)    All other components within normal limits  COMPREHENSIVE METABOLIC PANEL - Abnormal; Notable for the following components:   Potassium 6.1 (*)    CO2 13 (*)    Glucose, Bld 138 (*)    BUN 155 (*)    Creatinine, Ser 7.03 (*)    Calcium  7.3 (*)  Total Protein 5.5 (*)    Albumin  3.1 (*)    AST 66 (*)    ALT 61 (*)    GFR, Estimated 7 (*)    Anion gap 21 (*)    All other components within normal limits  CBC WITH DIFFERENTIAL/PLATELET - Abnormal; Notable for the following components:   RBC 2.47 (*)    Hemoglobin 7.9 (*)    HCT 25.7 (*)    MCV 104.0 (*)    RDW 16.0 (*)    Platelets 66 (*)    Lymphs Abs 0.6 (*)    All other components within normal limits  PROTIME-INR - Abnormal; Notable for the following components:   Prothrombin Time 15.7 (*)    All other components within normal limits  BRAIN NATRIURETIC PEPTIDE - Abnormal; Notable for the following components:   B Natriuretic Peptide 320.1 (*)    All other components within normal limits  URINALYSIS, ROUTINE W REFLEX MICROSCOPIC - Abnormal; Notable for the following components:   Color, Urine YELLOW (*)    APPearance HAZY (*)    Protein, ur 30 (*)    Bacteria, UA RARE (*)    All other components within normal limits  BLOOD GAS, VENOUS -  Abnormal; Notable for the following components:   pH, Ven 7.18 (*)    pCO2, Ven 38 (*)    pO2, Ven <31 (*)    Bicarbonate 14.2 (*)    Acid-base deficit 13.4 (*)    All other components within normal limits  CBG MONITORING, ED - Abnormal; Notable for the following components:   Glucose-Capillary 102 (*)    All other components within normal limits  CBG MONITORING, ED - Abnormal; Notable for the following components:   Glucose-Capillary 108 (*)    All other components within normal limits  TROPONIN I (HIGH SENSITIVITY) - Abnormal; Notable for the following components:   Troponin I (High Sensitivity) 1,451 (*)    All other components within normal limits  TROPONIN I (HIGH SENSITIVITY) - Abnormal; Notable for the following components:   Troponin I (High Sensitivity) 1,320 (*)    All other components within normal limits  RESP PANEL BY RT-PCR (RSV, FLU A&B, COVID)  RVPGX2  CULTURE, BLOOD (ROUTINE X 2)  CULTURE, BLOOD (ROUTINE X 2)  APTT  PROCALCITONIN  HEPARIN  LEVEL (UNFRACTIONATED)  CBC  DIFFERENTIAL  TECHNOLOGIST SMEAR REVIEW    EKG Sinus rhythm with a rate of 99 bpm.  Normal axis.  Right bundle.  No STEMI.  Nonspecific changes.  RADIOLOGY 1 view CXR interpreted by me with left-sided basilar infiltrate and effusion CT head interpreted by me without evidence of acute intracranial pathology  Official radiology report(s): CT HEAD WO CONTRAST ( ) Result Date: 09/17/2023 CLINICAL DATA:  Non focal encephalopathy. Evaluate for intracranial hemorrhage or CVA. Acute onset of confusion and lethargy. EXAM: CT HEAD WITHOUT CONTRAST TECHNIQUE: Contiguous axial images were obtained from the base of the skull through the vertex without intravenous contrast. RADIATION DOSE REDUCTION: This exam was performed according to the departmental dose-optimization program which includes automated exposure control, adjustment of the mA and/or kV according to patient size and/or use of iterative  reconstruction technique. COMPARISON:  CT head without contrast 11/17/2021. FINDINGS: Brain: Moderate atrophy and white matter changes demonstrate some progression since the prior exam. No acute infarct, hemorrhage, or mass lesion is present. The ventricles are of normal size. No significant extraaxial fluid collection is present. The brainstem and cerebellum are within normal limits. A relatively empty sella  is present. Midline structures are otherwise within normal limits. Vascular: Atherosclerotic calcifications are present within the cavernous internal carotid arteries bilaterally. No hyperdense vessel is present. Skull: Calvarium is intact. No focal lytic or blastic lesions are present. No significant extracranial soft tissue lesion is present. Sinuses/Orbits: Bilateral lens replacements are noted. Globes and orbits are otherwise unremarkable. The paranasal sinuses and mastoid air cells are clear. IMPRESSION: 1. No acute intracranial abnormality or significant interval change. 2. Moderate atrophy and white matter disease demonstrates some progression since the prior exam. This likely reflects the sequela of chronic microvascular ischemia. Electronically Signed   By: Lonni Necessary M.D.   On: 09/17/2023 14:51   DG Chest Port 1 View Result Date: 09/17/2023 CLINICAL DATA:  Sepsis EXAM: PORTABLE CHEST 1 VIEW COMPARISON:  X-ray 08/30/2023. FINDINGS: Enlarged cardiopericardial silhouette. Sternal wires. Stable widened mediastinum with a calcified aorta. Persistent left pleural effusion but improving. Adjacent mild opacity. Presumed calcification along the left lung apex. No pneumothorax or edema. Overlapping cardiac leads. IMPRESSION: Postop chest.  Enlarged heart. Improving left pleural effusion. Electronically Signed   By: Ranell Bring M.D.   On: 09/17/2023 12:21    PROCEDURES and INTERVENTIONS:  .1-3 Lead EKG Interpretation  Performed by: Claudene Rover, MD Authorized by: Claudene Rover, MD      Interpretation: normal     ECG rate:  80   ECG rate assessment: normal     Rhythm: sinus rhythm     Ectopy: none     Conduction: normal   .Critical Care  Performed by: Claudene Rover, MD Authorized by: Claudene Rover, MD   Critical care provider statement:    Critical care time (minutes):  75   Critical care time was exclusive of:  Separately billable procedures and treating other patients   Critical care was necessary to treat or prevent imminent or life-threatening deterioration of the following conditions:  Circulatory failure and cardiac failure   Critical care was time spent personally by me on the following activities:  Development of treatment plan with patient or surrogate, discussions with consultants, evaluation of patient's response to treatment, examination of patient, ordering and review of laboratory studies, ordering and review of radiographic studies, ordering and performing treatments and interventions, pulse oximetry, re-evaluation of patient's condition and review of old charts   Medications  heparin  ADULT infusion 100 units/mL (25000 units/250mL) (1,250 Units/hr Intravenous New Bag/Given 09/17/23 1410)  hydrocortisone  sodium succinate  (SOLU-CORTEF ) 100 MG injection 100 mg (100 mg Intravenous Given 09/17/23 1246)  lactated ringers  bolus 1,000 mL (1,000 mLs Intravenous New Bag/Given 09/17/23 1227)  heparin  injection 4,000 Units (4,000 Units Intravenous Given 09/17/23 1318)  sodium zirconium cyclosilicate  (LOKELMA ) packet 10 g (10 g Oral Given 09/17/23 1406)  insulin  aspart (novoLOG ) injection 10 Units (10 Units Intravenous Given 09/17/23 1414)    And  dextrose  50 % solution 50 mL (50 mLs Intravenous Given 09/17/23 1358)  lactated ringers  bolus 1,000 mL (1,000 mLs Intravenous New Bag/Given 09/17/23 1336)     IMPRESSION / MDM / ASSESSMENT AND PLAN / ED COURSE  I reviewed the triage vital signs and the nursing notes.  Differential diagnosis includes, but is not limited to, stroke, sepsis,  hypovolemia, ACS, viral syndrome, urosepsis, urinary retention, AKI, hyper uremia  {Patient presents with symptoms of an acute illness or injury that is potentially life-threatening.  Patient presents with nonfocal altered mentation likely multifactorial in the setting of an AKI, hyper uremia and urinary retention.  Blood work with AKI on CKD with hyperkalemia.  He receives Lokelma , insulin  glucose, Foley catheter and IV fluids.  Hematologically he has a chronic anemia that is stable from his recent admission but an acute thrombocytopenia of uncertain etiology.  Adding a smear review for pathology to review to see if he has any schistocytes.  His bilirubin is normal and so a consumptive process seems less likely.  I do consult with hematology who will consult.  Urine without infectious features.  Troponin is elevated, downtrending and possibly related to his hypotension.  Lactic acidosis is improving and of possibly similar etiology.  Clear CT head and improving CXR.  Procalcitonin is only mildly elevated.  He should have just finished his linezolid  antibiotics around today and I am not sure infectious etiology or contributing to his symptoms.  We draw alters but do not provide additional antibiotics at this point.    Overall, signs of multiorgan failure.  His blood pressure does soften again and I believe he would be more appropriate for the ICU.  So I do consult with Dr. Isadora for admission  Clinical Course as of 09/17/23 1557  Thu Sep 17, 2023  1219 USIV placed by me.  Unable to void while I am in there, he was trying with the nurse.  I look at his bladder ultrasound and is quite full.  I recommend Foley catheter and he is agreeable. [DS]  1300 Reassessed.  Has a big smile and says thank you and reports feeling better after the Foley was placed. BP remains similar, MAPs around 65-68 [DS]  1417 Reassessed.  Wife at the bedside.  He is just not eating and drinking.  She reports generalized  symptoms [DS]  1517 Consult with medicine who agrees to admit [DS]  1550 I consult with Dr. Babara. Hematology. Probably not TTP, though uncertain. Will add smear. She will consult.  [DS]  1554 After this, unfortunately his blood pressure drops again.  Will reach out to the ICU for admission [DS]    Clinical Course User Index [DS] Claudene Rover, MD     FINAL CLINICAL IMPRESSION(S) / ED DIAGNOSES   Final diagnoses:  NSTEMI (non-ST elevated myocardial infarction) (HCC)  Acute urinary retention  AKI (acute kidney injury) (HCC)  Hypotension, unspecified hypotension type     Rx / DC Orders   ED Discharge Orders     None        Note:  This document was prepared using Dragon voice recognition software and may include unintentional dictation errors.   Claudene Rover, MD 09/17/23 1533    Claudene Rover, MD 09/17/23 [id number]

## 2023-09-17 NOTE — Consult Note (Signed)
 PHARMACY CONSULT NOTE - FOLLOW UP  Pharmacy Consult for Electrolyte Monitoring and Replacement   Recent Labs: Potassium (mmol/L)  Date Value  09/17/2023 6.1 (H)   Magnesium  (mg/dL)  Date Value  87/74/7975 2.3   Calcium  (mg/dL)  Date Value  98/90/7974 7.3 (L)   Albumin  (g/dL)  Date Value  98/90/7974 3.1 (L)   Phosphorus (mg/dL)  Date Value  98/94/7981 3.4   Sodium  Date Value  09/17/2023 141 mmol/L  09/16/2023 142   Assessment: Kurt Aguilar is a 56 male who presents to the ED with altered mental status. Admitted with AKI on CKD with hyperkalemia. Past medical history significant for CHF, CAD s/p CABG. They have a foley catheter. Pharmacy was consulted to manage this patient's electrolytes.   Goal of Therapy:  Electrolytes WNL (target K towards 4 due to CHF) 09/17/2023 K = 6.1 Mg = NA Phos = NA  Plan:  MD ordered 10 mg of Lokelma   Follow-up with BMP, Mg, and Phos with AM labs  Kurt Aguilar, PharmD Pharmacy Resident  09/17/2023 4:12 PM

## 2023-09-17 NOTE — Progress Notes (Signed)
 Location:  Other Nursing Home Room Number: [facility] 116A Place of Service:  SNF (31)  Tower, Laine LABOR, MD  Patient Care Team: Tower, Laine LABOR, MD as PCP - General Perla, Evalene PARAS, MD as PCP - Cardiology (Cardiology) Perla, Evalene PARAS, MD as Consulting Physician (Cardiology) Darlean Ozell NOVAK, MD as Consulting Physician (Pulmonary Disease) Arloa Larnell POUR., MD as Referring Physician (Dentistry) Fate Morna SAILOR, [facility] (Inactive) as Pharmacist (Pharmacist)  Extended Emergency Contact Information Primary Emergency Contact: [facility] Address: [address]          [place], [place] [address] [place] Home Phone: [phone] Mobile Phone: [phone] Relation: Spouse  Goals of care: Advanced Directive information    09/10/2023   12:14 PM  Advanced Directives  Does Patient Have a Medical Advance Directive? Yes  Type of Estate Agent of [place];Out of facility DNR (pink MOST or yellow form)  Does patient want to make changes to medical advance directive? No - Patient declined  Copy of Healthcare Power of Attorney in Chart? Yes - validated most recent copy scanned in chart (See row information)     Chief Complaint  Patient presents with   Acute Visit    Lethargic and Confusion.     HPI:  Pt is a 78 y.o. male seen today for an acute visit for confusion and lethargy.  Pt with hx of HCAP, acute hypoxic resp failure on O2,  tracheobronchaomalcia at [facility] for rehab.  Additional hx is CKD, CAD, DM, OA Staff reports over the last 24 hours he has become more lethargic and confused.  He has had a hx of back pain and recently with severe pain and spasms, he was started on zanaflex and hydrocodone  which helped him in the past. Wife reports overall it has helped but he is still having back spasm. When he has a spasm his wife reports he is not there- he spaces out and appears unresponsive for a few seconds. The wife PT has noticed the spasms  and can see the muscles tightening up he reports he has not been eating or drinking well either.  Recent blood work shows elevation in Cr to 5.35 up from 3.35  Staff also concerned due to appearing juandice. Total bil at 1.3 yesterday,  previously 1.2  He reports he had a BM recently (previously has been constipated) and unable to to urinate but was able to urinare with his BM.  He reports some abdominal discomfort but resolved with BM  Past Medical History:  Diagnosis Date   Arthritis    Chronic kidney disease    Complication of anesthesia    constipation and inability to  urinate happened a few days after cataract surgery    Diabetes (HCC)    type 2    Diverticulitis    GERD (gastroesophageal reflux disease)    Gout    Heart attack (HCC)    Heart disease    Heart murmur    Hyperglycemia    Hyperlipidemia    Hypertension    Osteoporosis    Pneumonia    hx of BOOP followed by Dr Darlean    Sleep apnea    cpapp - setting at 15    Past Surgical History:  Procedure Laterality Date   ACROMIO-CLAVICULAR JOINT REPAIR Left 05/05/2019   Procedure: SHOULDER ARTHROSCOPIC ASSISTED ACROMIO-CLAVICULAR JOINT REPAIR;  Surgeon: Dozier Soulier, MD;  Location: [facility];  Service: Orthopedics;  Laterality: Left;   CATARACT EXTRACTION Bilateral  CORONARY ARTERY BYPASS GRAFT     [place]    LEFT HEART CATH AND CORS/GRAFTS ANGIOGRAPHY N/A 09/19/2021   Procedure: LEFT HEART CATH AND CORS/GRAFTS ANGIOGRAPHY;  Surgeon: Claudene Victory ORN, MD;  Location: [facility] INVASIVE CV [facility];  Service: Cardiovascular;  Laterality: N/A;   open heart surgery  09/09/1995-1998   5 bypass   REPAIR KNEE LIGAMENT     right   SHOULDER ARTHROSCOPY Left 05/05/2019   Procedure: ARTHROSCOPY SHOULDER;  Surgeon: Dozier Soulier, MD;  Location: [facility];  Service: Orthopedics;  Laterality: Left;   TONSILLECTOMY     TOTAL KNEE ARTHROPLASTY Left 05/29/2020   Procedure: LEFT TOTAL KNEE ARTHROPLASTY;  Surgeon: Sheril Coy, MD;   Location: [facility];  Service: Orthopedics;  Laterality: Left;    Allergies  Allergen Reactions   Oxycodone  Anaphylaxis    02/25/2022     Outpatient Encounter Medications as of 09/17/2023  Medication Sig   acetaminophen  (TYLENOL ) 500 MG tablet Take 1,000 mg by mouth every 8 (eight) hours as needed.   aspirin  EC (ASPIRIN  81) 81 MG tablet Take 81 mg by mouth daily. Swallow whole.   bisoprolol  (ZEBETA ) 5 MG tablet Take 0.5 tablets (2.5 mg total) by mouth daily.   Calcium  Carbonate-Vit D-Min (CALCIUM -VITAMIN D -MINERALS) 600-800 MG-UNIT CHEW Chew 1 tablet by mouth daily.   Continuous Blood Gluc Receiver (FREESTYLE LIBRE 2 READER) DEVI USE AS INSTRUCTED TO CHECK BLOOD SUGARS.   Continuous Blood Gluc Sensor (FREESTYLE LIBRE 2 SENSOR) MISC 1 Device by Does not apply route every 14 (fourteen) days.   cyanocobalamin  (VITAMIN B12) 500 MCG tablet Take 500 mcg by mouth daily.   ezetimibe  (ZETIA ) 10 MG tablet TAKE 1 TABLET BY MOUTH EVERY DAY   famotidine  (PEPCID ) 20 MG tablet TAKE 1 TABLET BY MOUTH EVERYDAY AT BEDTIME   gabapentin  (NEURONTIN ) 100 MG capsule Take 3 capsules (300 mg total) by mouth at bedtime. Reduced from 300 mg. (Patient taking differently: Take 300 mg by mouth at bedtime. Give one capsule by mouth once daily.)   Glucosamine HCl (GLUCOSAMINE PO) Take 1,500 mg by mouth 2 (two) times daily.    glucose blood (FREESTYLE LITE) test strip 1 each by Other route 4 (four) times daily. E11.65   HYDROcodone -acetaminophen  (NORCO/VICODIN) 5-325 MG tablet Take 1 tablet by mouth every 6 (six) hours as needed for moderate pain (pain score 4-6) or severe pain (pain score 7-10).   insulin  isophane & regular human KwikPen (NOVOLIN  70/30 KWIKPEN) (70-30) 100 UNIT/ML KwikPen Inject 30 Units into the skin daily with breakfast. And pen needles 1/day   insulin  lispro (HUMALOG  KWIKPEN) 200 UNIT/ML KwikPen Inject 10 Units into the skin 2 (two) times daily before a meal. Inject before lunch and dinner.   Insulin  Pen  Needle (BD PEN NEEDLE NANO 2ND GEN) 32G X 4 MM MISC USE 3 TIMES A DAY   ketoconazole (NIZORAL) 2 % cream Apply 1 Application topically 2 (two) times daily.   Lancets (FREESTYLE) lancets 1 EACH BY OTHER ROUTE 4 (FOUR) TIMES DAILY. E11.65   linezolid  (ZYVOX ) 600 MG tablet Take 600 mg by mouth 2 (two) times daily.   Menthol, Topical Analgesic, (BIOFREEZE COOL THE PAIN) 4 % GEL Apply to lower back topically four times daily   Multiple Vitamins-Minerals (PRESERVISION AREDS 2 PO) Take 1 capsule by mouth daily.   nitroGLYCERIN  (NITROSTAT ) 0.4 MG SL tablet Place 1 tablet (0.4 mg total) under the tongue every 5 (five) minutes x 3 doses as needed for chest pain.   omeprazole  (PRILOSEC) 40  MG capsule TAKE 1 CAPUSLE BY MOUTH 30- 60 MIN BEFORE YOUR FIRST AND LAST MEALS OF THE DAY   ondansetron  (ZOFRAN ) 8 MG tablet Take 1 tablet (8 mg total) by mouth every 8 (eight) hours as needed for nausea or vomiting. From narcotic pain medicine   OXYGEN 2lpm every shift related to acute respiratory failure with hypoxia.   polyethylene glycol (MIRALAX  / GLYCOLAX ) 17 g packet Take 17 g by mouth daily.   predniSONE  (DELTASONE ) 20 MG tablet Starting on 09/07/23, take 30 mg prednisone  daily for 7 days, and decrease by 10mg  every 7 days until 10mg  and maintain until seen as outpatient by pulmonologist. (Patient taking differently: Take 10 mg by mouth daily. Give 20mg  by mouth once daily for 6 days, then reduce to 10mg  daily.)   rosuvastatin  (CRESTOR ) 40 MG tablet TAKE 1 TABLET BY MOUTH EVERY DAY   Semaglutide , 2 MG/DOSE, (OZEMPIC , 2 MG/DOSE,) 8 MG/3ML SOPN Inject 2 mg into the skin once a week.   senna-docusate (SENOKOT-S) 8.6-50 MG tablet Take 1 tablet by mouth at bedtime.   albuterol  (PROVENTIL ) (2.5 MG/3ML) 0.083% nebulizer solution Take 3 mLs (2.5 mg total) by nebulization 2 (two) times daily. (Patient not taking: Reported on 09/17/2023)   allopurinol  (ZYLOPRIM ) 100 MG tablet Take 50 mg by mouth daily. Every 2 days. (Patient not  taking: Reported on 09/17/2023)   No facility-administered encounter medications on file as of 09/17/2023.    Review of Systems  Constitutional:  Positive for activity change, appetite change and fatigue. Negative for unexpected weight change.  HENT:  Negative for congestion and hearing loss.   Eyes: Negative.   Respiratory:  Positive for shortness of breath. Negative for cough.   Cardiovascular:  Negative for chest pain, palpitations and leg swelling.  Gastrointestinal:  Positive for constipation. Negative for abdominal pain and diarrhea.  Genitourinary:  Positive for difficulty urinating. Negative for dysuria.  Musculoskeletal:  Positive for joint swelling and myalgias. Negative for arthralgias.  Skin:  Positive for color change. Negative for wound.  Neurological:  Positive for tremors and weakness. Negative for dizziness.  Psychiatric/Behavioral:  Positive for confusion. Negative for agitation and behavioral problems.     Immunization History  Administered Date(s) Administered   Fluad Quad(high Dose 65+) 06/25/2019, 05/29/2022   Influenza Split 07/09/2011   Influenza Whole 05/14/2010, 06/09/2012   Influenza, High Dose Seasonal PF 05/22/2014, 07/04/2015, 05/16/2016, 07/09/2018, 05/20/2023   Influenza,inj,Quad PF,6+ Mos 07/11/2013   Influenza-Unspecified 07/03/2017, 06/14/2021   Moderna Sars-Covid-2 Vaccination 11/18/2019, 12/27/2019, 07/24/2020, 04/12/2021, 06/14/2021   Pfizer Covid-19 Vaccine Bivalent Booster 61yrs & up 06/16/2022   Pfizer(Comirnaty)Fall Seasonal Vaccine 12 years and older 05/20/2023   Pneumococcal Conjugate-13 08/07/2014   Pneumococcal Polysaccharide-23 09/08/2002, 07/11/2013   Respiratory Syncytial Virus Vaccine,Recomb Aduvanted(Arexvy) 06/16/2022   Td 09/08/2004, 02/12/2016   Zoster Recombinant(Shingrix) 04/06/2018, 06/17/2018   Zoster, Live 07/09/2011   Pertinent  Health Maintenance Due  Topic Date Due   HEMOGLOBIN A1C  03/15/2024   FOOT EXAM  05/07/2024    OPHTHALMOLOGY EXAM  08/04/2024   INFLUENZA VACCINE  Completed   Colonoscopy  Discontinued      08/18/2022    8:45 PM 08/19/2022    8:46 AM 10/09/2022    1:57 PM 04/22/2023    8:52 AM 04/30/2023    1:40 PM  Fall Risk  Falls in the past year?   0 0 1  Was there an injury with Fall?   1 0 0  Fall Risk Category Calculator   2 0 2  (  RETIRED) Patient Fall Risk Level High fall risk High fall risk     Patient at Risk for Falls Due to   Other (Comment)  Impaired balance/gait;Impaired mobility  Patient at Risk for Falls Due to - Comments   Medication was off and made his passed out    Fall risk Follow up   Falls evaluation completed Falls evaluation completed Falls evaluation completed;Falls prevention discussed;Education provided   Functional Status Survey:    Vitals:   09/17/23 1037  BP: (!) 81/56  Pulse: (!) 107  Resp: 16  Temp: (!) 97.1 F (36.2 C)  SpO2: 90%   There is no height or weight on file to calculate BMI. Physical Exam Constitutional:      General: He is not in acute distress.    Appearance: He is well-developed. He is obese. He is ill-appearing. He is not diaphoretic.  HENT:     Head: Normocephalic and atraumatic.     Right Ear: External ear normal.     Left Ear: External ear normal.     Mouth/Throat:     Pharynx: No oropharyngeal exudate.  Eyes:     Conjunctiva/sclera: Conjunctivae normal.     Pupils: Pupils are equal, round, and reactive to light.  Cardiovascular:     Rate and Rhythm: Normal rate and regular rhythm.     Heart sounds: Normal heart sounds.  Pulmonary:     Effort: Pulmonary effort is normal.     Breath sounds: Normal breath sounds.  Abdominal:     General: Bowel sounds are normal.     Palpations: Abdomen is soft.  Musculoskeletal:        General: No tenderness.     Cervical back: Normal range of motion and neck supple.     Right lower leg: No edema.     Left lower leg: No edema.  Skin:    General: Skin is warm and dry.     Coloration:  Skin is jaundiced.  Neurological:     Mental Status: He is alert. He is disoriented.     Motor: Weakness present.     Gait: Gait abnormal.     Labs reviewed: Recent Labs    08/28/23 0455 08/30/23 1328 09/01/23 0537 09/02/23 0435 09/02/23 1614 09/03/23 0516 09/04/23 0456 09/05/23 0550 09/16/23 0000  NA 143   < > 137 139  --  140 139 139 142  K 4.6   < > 3.8 4.0  --  3.8 4.2 4.4 5.0  CL 101   < > 97* 100  --  101 102 104 108  CO2 27   < > 24 25  --  25 22 23 20   GLUCOSE 117*   < > 225* 218*   < > 156* 199* 178*  --   BUN 86*   < > 86* 92*  --  88* 85* 78* 121*  CREATININE 3.73*   < > 3.83* 3.76*  --  3.70* 3.45* 3.35* 5.4*  CALCIUM  9.0   < > 8.4* 8.5*  --  8.8* 8.9 8.7* 7.9*  MG 2.9*  --  2.2 2.3  --   --   --   --   --    < > = values in this interval not displayed.   Recent Labs    08/30/23 1328 08/31/23 0144 09/05/23 0546 09/16/23 0000  AST 257* 140* 32 51*  ALT 403* 284* 84* 58*  ALKPHOS 184* 145* 78 75  BILITOT 0.8 0.8 1.2*  --  PROT 7.1 6.1* 5.4*  --   ALBUMIN  3.0* 2.6* 3.0* 3.6   Recent Labs    08/30/23 1328 08/31/23 0144 09/01/23 0537 09/03/23 0516 09/04/23 0456 09/05/23 0550 09/16/23 0000  WBC 13.5* 12.7*   < > 15.2* 11.8* 10.6* 7.5  NEUTROABS 10.8* 10.2*  --   --   --   --  6,210.00  HGB 10.8* 9.8*   < > 9.0* 8.6* 7.9* 7.9*  HCT 34.4* 30.3*   < > 27.9* 26.6* 24.2* 25*  MCV 102.7* 100.3*   < > 101.5* 100.0 101.3*  --   PLT 331 274   < > 247 239 225 81*   < > = values in this interval not displayed.   Lab Results  Component Value Date   TSH 3.47 04/15/2023   Lab Results  Component Value Date   HGBA1C 8.2 09/16/2023   Lab Results  Component Value Date   CHOL 138 04/15/2023   HDL 47.60 04/15/2023   LDLCALC 33 02/14/2022   LDLDIRECT 63.0 04/15/2023   TRIG 320.0 (H) 04/15/2023   CHOLHDL 3 04/15/2023    Significant Diagnostic Results in last 30 days:  US  Venous Img Lower Unilateral Right (DVT) Result Date: 09/05/2023 CLINICAL DATA:   Right leg pain, initial encounter EXAM: RIGHT LOWER EXTREMITY VENOUS DOPPLER ULTRASOUND TECHNIQUE: Gray-scale sonography with graded compression, as well as color Doppler and duplex ultrasound were performed to evaluate the lower extremity deep venous systems from the level of the common femoral vein and including the common femoral, femoral, profunda femoral, popliteal and calf veins including the posterior tibial, peroneal and gastrocnemius veins when visible. The superficial great saphenous vein was also interrogated. Spectral Doppler was utilized to evaluate flow at rest and with distal augmentation maneuvers in the common femoral, femoral and popliteal veins. COMPARISON:  None Available. FINDINGS: Contralateral Common Femoral Vein: Respiratory phasicity is normal and symmetric with the symptomatic side. No evidence of thrombus. Normal compressibility. Common Femoral Vein: No evidence of thrombus. Normal compressibility, respiratory phasicity and response to augmentation. Saphenofemoral Junction: No evidence of thrombus. Normal compressibility and flow on color Doppler imaging. Profunda Femoral Vein: No evidence of thrombus. Normal compressibility and flow on color Doppler imaging. Femoral Vein: No evidence of thrombus. Normal compressibility, respiratory phasicity and response to augmentation. Popliteal Vein: No evidence of thrombus. Normal compressibility, respiratory phasicity and response to augmentation. Calf Veins: No evidence of thrombus. Normal compressibility and flow on color Doppler imaging. Superficial Great Saphenous Vein: No evidence of thrombus. Normal compressibility. Venous Reflux:  None. Other Findings:  None. IMPRESSION: No evidence of deep venous thrombosis. Electronically Signed   By: Oneil Devonshire M.D.   On: 09/05/2023 19:01   ECHOCARDIOGRAM COMPLETE Result Date: 08/31/2023    ECHOCARDIOGRAM REPORT   Patient Name:   KERRION [facility]PPAINEN [facility] Date of Exam: 08/31/2023 Medical Rec #:  [id number]         Height:       70.0 in Accession #:    [id number]       Weight:       258.6 lb Date of Birth:  1946/07/20        BSA:          2.328 m Patient Age:    77 years         BP:           123/73 mmHg Patient Gender: M                HR:  86 bpm. Exam Location:  [facility] Procedure: 2D Echo, Color Doppler, Cardiac Doppler and Intracardiac            Opacification Agent Indications:     NSTEMI I21.4  History:         Patient has prior history of Echocardiogram examinations, most                  recent 02/05/2023. Previous Myocardial Infarction, Prior CABG;                  Risk Factors:Hypertension and Dyslipidemia.  Sonographer:     Christopher Furnace Referring Phys:  [id number] [place]LAY[place]NE BROOM Diagnosing Phys: Deatrice Cage MD IMPRESSIONS  1. Left ventricular ejection fraction, by estimation, is 55 to 60%. The left ventricle has normal function. The left ventricle has no regional wall motion abnormalities. There is mild left ventricular hypertrophy. Left ventricular diastolic parameters are indeterminate.  2. Right ventricular systolic function is normal. The right ventricular size is normal. There is normal pulmonary artery systolic pressure.  3. Left atrial size was moderately dilated.  4. The mitral valve is degenerative. No evidence of mitral valve regurgitation. Mild mitral stenosis. The mean mitral valve gradient is 4.0 mmHg. Severe mitral annular calcification.  5. The aortic valve is calcified. Aortic valve regurgitation is not visualized. Mild to moderate aortic valve stenosis. AV gradient does not seem to be accurate. Stenosis degree is assesed visually. FINDINGS  Left Ventricle: Left ventricular ejection fraction, by estimation, is 55 to 60%. The left ventricle has normal function. The left ventricle has no regional wall motion abnormalities. Definity  contrast agent was given IV to delineate the left ventricular  endocardial borders. The left ventricular internal cavity size was normal in size. There is mild left  ventricular hypertrophy. Left ventricular diastolic parameters are indeterminate. Right Ventricle: The right ventricular size is normal. No increase in right ventricular wall thickness. Right ventricular systolic function is normal. There is normal pulmonary artery systolic pressure. The tricuspid regurgitant velocity is 2.42 m/s, and  with an assumed right atrial pressure of 5 mmHg, the estimated right ventricular systolic pressure is 28.4 mmHg. Left Atrium: Left atrial size was moderately dilated. Right Atrium: Right atrial size was normal in size. Pericardium: There is no evidence of pericardial effusion. Mitral Valve: The mitral valve is degenerative in appearance. Severe mitral annular calcification. No evidence of mitral valve regurgitation. Mild mitral valve stenosis. MV peak gradient, 11.6 mmHg. The mean mitral valve gradient is 4.0 mmHg. Tricuspid Valve: The tricuspid valve is normal in structure. Tricuspid valve regurgitation is mild . No evidence of tricuspid stenosis. Aortic Valve: The aortic valve is calcified. Aortic valve regurgitation is not visualized. Mild to moderate aortic stenosis is present. Aortic valve mean gradient measures 2.0 mmHg. Aortic valve peak gradient measures 3.0 mmHg. Aortic valve area, by VTI measures 3.47 cm. Pulmonic Valve: The pulmonic valve was normal in structure. Pulmonic valve regurgitation is not visualized. No evidence of pulmonic stenosis. Aorta: The aortic root is normal in size and structure. Venous: The inferior vena cava was not well visualized. IAS/Shunts: No atrial level shunt detected by color flow Doppler.  LEFT VENTRICLE PLAX 2D LVIDd:         4.00 cm   Diastology LVIDs:         3.05 cm   LV e' medial:    5.33 cm/s LV PW:         1.10 cm   LV E/e' medial:  22.1 LV IVS:  1.40 cm   LV e' lateral:   7.29 cm/s LVOT diam:     2.00 cm   LV E/e' lateral: 16.2 LV SV:         53 LV SV Index:   23 LVOT Area:     3.14 cm  RIGHT VENTRICLE RV Basal diam:  3.60 cm RV  Mid diam:    3.40 cm RV S prime:     9.90 cm/s TAPSE (M-mode): 1.8 cm LEFT ATRIUM             Index        RIGHT ATRIUM           Index LA diam:        4.90 cm 2.10 cm/m   RA Area:     19.50 cm LA Vol (A2C):   81.6 ml 35.05 ml/m  RA Volume:   49.40 ml  21.22 ml/m LA Vol (A4C):   67.5 ml 28.99 ml/m LA Biplane Vol: 73.7 ml 31.66 ml/m  AORTIC VALVE AV Area (Vmax):    3.04 cm AV Area (Vmean):   2.80 cm AV Area (VTI):     3.47 cm AV Vmax:           86.50 cm/s AV Vmean:          63.500 cm/s AV VTI:            0.152 m AV Peak Grad:      3.0 mmHg AV Mean Grad:      2.0 mmHg LVOT Vmax:         83.80 cm/s LVOT Vmean:        56.500 cm/s LVOT VTI:          0.168 m LVOT/AV VTI ratio: 1.11  AORTA Ao Root diam: 3.30 cm MITRAL VALVE                TRICUSPID VALVE MV Area (PHT): 2.68 cm     TR Peak grad:   23.4 mmHg MV Area VTI:   1.38 cm     TR Vmax:        242.00 cm/s MV Peak grad:  11.6 mmHg MV Mean grad:  4.0 mmHg     SHUNTS MV Vmax:       1.70 m/s     Systemic VTI:  0.17 m MV Vmean:      98.8 cm/s    Systemic Diam: 2.00 cm MV Decel Time: 283 msec MV E velocity: 118.00 cm/s MV A velocity: 140.00 cm/s MV E/A ratio:  0.84 Deatrice Cage MD Electronically signed by Deatrice Cage MD Signature Date/Time: 08/31/2023/3:20:37 PM    Final    NM Pulmonary Perfusion Result Date: 08/31/2023 CLINICAL DATA:  Worsening shortness of breath. EXAM: NUCLEAR MEDICINE PERFUSION LUNG SCAN TECHNIQUE: Perfusion images were obtained in multiple projections after intravenous injection of radiopharmaceutical. Ventilation scans intentionally deferred if perfusion scan and chest x-ray adequate for interpretation during COVID 19 epidemic. RADIOPHARMACEUTICALS:  4.28 mCi Tc-64m MAA IV COMPARISON:  Chest CT and radiograph 08/30/2023 FINDINGS: Comparison chest radiograph demonstrates airspace opacities in the left lower lobe. Perfusion imaging demonstrates no segmental or subsegmental perfusion abnormalities. Perfusion defect in the left lower  lobe corresponds to airspace opacity seen on prior imaging. IMPRESSION: Nonsegmental perfusion defect correlating to airspace opacities seen on prior imaging. Low probability of pulmonary embolism. Electronically Signed   By: Norman Gatlin M.D.   On: 08/31/2023 10:42   US  Abdomen Complete Result Date: 08/30/2023 CLINICAL DATA:  Acute kidney injury. Elevated liver function studies. EXAM: ABDOMEN ULTRASOUND COMPLETE COMPARISON:  CT abdomen and pelvis 08/17/2022 FINDINGS: Gallbladder: No gallstones or wall thickening visualized. No sonographic Murphy sign noted by sonographer. Common bile duct: Diameter: 3 mm, normal Liver: Diffusely increased hepatic parenchymal echotexture suggesting fatty infiltration. Subcentimeter cysts. Portal vein is patent on color Doppler imaging with normal direction of blood flow towards the liver. IVC: No abnormality visualized. Pancreas: Visualized portion unremarkable. Spleen: Size and appearance within normal limits. Right Kidney: Length: 15.3 cm. Multiple parenchymal cysts are demonstrated with benign appearance. Largest measures 6.1 cm maximal diameter. Changes are consistent with polycystic kidney. No hydronephrosis. No solid mass identified. No imaging follow-up is indicated. Left Kidney: Length: 12.5 cm. Multiple parenchymal cysts are demonstrated with benign appearance. Largest measures 4.8 cm in maximal diameter. Changes are consistent with polycystic kidney. No hydronephrosis or solid mass identified. No follow-up imaging is indicated. Abdominal aorta: No aneurysm visualized. Other findings: Examination is somewhat technically limited due to body habitus, bowel gas, and limited mobility. IMPRESSION: 1. Diffuse fatty infiltration of the liver. 2. Polycystic kidneys without evidence of obstruction. Electronically Signed   By: Elsie Gravely M.D.   On: 08/30/2023 20:25   CT CHEST WO CONTRAST Result Date: 08/30/2023 CLINICAL DATA:  Recent history of pneumonia/sepsis.  Productive cough. EXAM: CT CHEST WITHOUT CONTRAST TECHNIQUE: Multidetector CT imaging of the chest was performed following the standard protocol without IV contrast. RADIATION DOSE REDUCTION: This exam was performed according to the departmental dose-optimization program which includes automated exposure control, adjustment of the mA and/or kV according to patient size and/or use of iterative reconstruction technique. COMPARISON:  CT chest dated August 22, 2023. FINDINGS: Cardiovascular: Normal heart size. No pericardial effusion. Atherosclerosis of the thoracic aorta and arch branch vessels. Multivessel coronary artery calcifications. Aortic valve and mitral annulus calcifications. Prior CABG. Mediastinum/Nodes: Redemonstration of multiple prominent mediastinal lymph nodes, similar to slightly decreased in size compared to the prior exam. Calcified mediastinal and right hilar granulomatous nodes. Secretions noted in the left mainstem bronchus. Thyroid  gland and esophagus demonstrate no significant abnormality. Lungs/Pleura: Similar small right and trace left pleural effusions. Calcified pleural plaques in the left hemithorax. Patchy bilateral multifocal peripheral predominant consolidation is again noted with increased consolidative changes at the right lung base and the superior aspect of the posterior left lower lobe. No pneumothorax. Upper Abdomen: No acute abnormality. Musculoskeletal: Intact median sternotomy wires. No suspicious osseous lesion. No acute osseous abnormality. IMPRESSION: 1. Multilobar pneumonia with increased airspace consolidation at the right lung base and the posterior left lower lobe. 2. Similar small right and trace left pleural effusions. 3. Additional unchanged ancillary findings, as described above. Aortic Atherosclerosis (ICD10-I70.0). Electronically Signed   By: Harrietta Sherry M.D.   On: 08/30/2023 17:03   DG Chest 2 View Result Date: 08/30/2023 CLINICAL DATA:  Cough and  dyspnea. Recent hospitalization for pneumonia/sepsis. EXAM: CHEST - 2 VIEW COMPARISON:  08/22/2023 FINDINGS: Lungs are hypoinflated demonstrate persistent opacification over the left base likely infection with associated small left pleural effusion. Possible small amount right pleural fluid. Calcified pleural plaques over the lateral left upper lung/apex. Mild stable cardiomegaly. Remainder of the exam is unchanged. IMPRESSION: 1. Persistent opacification over the left base likely infection with associated small left pleural effusion. Possible small amount right pleural fluid. 2. Stable cardiomegaly. Electronically Signed   By: Toribio Agreste M.D.   On: 08/30/2023 13:43   CT Chest High Resolution Result Date: 08/23/2023 CLINICAL DATA:  78 year old male with  history of interstitial lung disease. Shortness of breath. EXAM: CT CHEST WITHOUT CONTRAST TECHNIQUE: Multidetector CT imaging of the chest was performed following the standard protocol without intravenous contrast. High resolution imaging of the lungs, as well as inspiratory and expiratory imaging, was performed. RADIATION DOSE REDUCTION: This exam was performed according to the departmental dose-optimization program which includes automated exposure control, adjustment of the mA and/or kV according to patient size and/or use of iterative reconstruction technique. COMPARISON:  Chest CTA 12/19/2015. FINDINGS: Cardiovascular: Heart size is normal. There is no significant pericardial fluid, thickening or pericardial calcification. There is aortic atherosclerosis, as well as atherosclerosis of the great vessels of the mediastinum and the coronary arteries, including calcified atherosclerotic plaque in the left main, left anterior descending, left circumflex and right coronary arteries. Status post median sternotomy for CABG including LIMA to the LAD. Severe calcifications of the aortic valve and mitral annulus. Mediastinum/Nodes: Multiple prominent borderline  enlarged mediastinal and hilar lymph nodes are incidentally noted. No definite pathologically enlarged lymph nodes are identified. Densely calcified subcarinal and right hilar lymph nodes are also incidentally noted. Esophagus is unremarkable in appearance. No axillary lymphadenopathy. Lungs/Pleura: Small right and trace left pleural effusions lying dependently. Calcified pleural plaques in the left hemithorax. No calcified pleural plaques in the right hemithorax. Patchy multifocal peripheral predominant airspace consolidation throughout the lungs bilaterally, most evident in the dependent portions of the right lower lobe and right upper lobe. High-resolution images also demonstrates some patchy areas of ground-glass attenuation, septal thickening and thickening of the peribronchovascular interstitium, however, today's study is overall limited by motion and the presence of what appears to be acute airspace consolidation, limiting accurate assessment for underlying interstitial lung disease. Inspiratory and expiratory imaging demonstrates severe collapse of the trachea and mainstem bronchi during expiration indicative of tracheobronchomalacia. Upper Abdomen: Partially imaged low-attenuation lesion in the upper left retroperitoneum measuring at least 3 cm in diameter, incompletely characterized on today's noncontrast CT examination, but statistically likely an exophytic cyst in the upper pole of the left kidney (no imaging follow-up recommended). Diffuse low attenuation throughout the visualized hepatic parenchyma, indicative of a background of hepatic steatosis. Musculoskeletal: Median sternotomy wires. There are no aggressive appearing lytic or blastic lesions noted in the visualized portions of the skeleton. IMPRESSION: 1. Limited examination for assessment of interstitial lung disease based on presence of what appears to be multilobar pneumonia, most severe in the right lower lobe. If there is persistent clinical  concern for interstitial lung disease (which is likely present in the background on today's study), repeat high-resolution chest CT should be performed after resolution of the patient's acute illness on an outpatient basis. 2. Small right and trace left pleural effusions lying dependently. 3. Calcified pleural plaques in the left hemithorax suggesting prior left-sided empyema and/or left-sided thoracic trauma. No right-sided calcified pleural plaques are noted to indicate asbestos related pleural disease at this time. 4. Severe tracheobronchomalacia. 5. Aortic atherosclerosis, in addition to left main and three-vessel coronary artery disease. Status post median sternotomy for CABG including LIMA to the LAD. 6. There are calcifications of the aortic valve and mitral annulus. Echocardiographic correlation for evaluation of potential valvular dysfunction may be warranted if clinically indicated. 7. Additional incidental findings, as above. Aortic Atherosclerosis (ICD10-I70.0). Electronically Signed   By: Toribio Aye M.D.   On: 08/23/2023 06:23   DG Chest 1 View Result Date: 08/22/2023 CLINICAL DATA:  Tachypnea EXAM: CHEST  1 VIEW COMPARISON:  Chest x-ray 08/20/2023 FINDINGS: Cardiomediastinal silhouette is enlarged,  unchanged. There is a stable small left pleural effusion with patchy left basilar opacities. Left-sided calcified pleural plaques are again noted. There is no pneumothorax or acute fracture. Sternotomy wires are present. IMPRESSION: 1. Stable small left pleural effusion with patchy left basilar opacities. 2. Left-sided calcified pleural plaques. Electronically Signed   By: Greig Pique M.D.   On: 08/22/2023 19:08   DG Chest Port 1 View Result Date: 08/20/2023 CLINICAL DATA:  Questionable sepsis - evaluate for abnormality. EXAM: PORTABLE CHEST 1 VIEW COMPARISON:  06/19/2023. FINDINGS: Low lung volume. There is new left basilar opacity with probable trace left pleural effusion. There is minimal  blunting of right lateral costophrenic angle as well, which may represent trace right pleural effusion. Bilateral lungs are otherwise clear. Redemonstration of left lateral pleural calcification, nonspecific but commonly seen with asbestos related disease. No pneumothorax on either side. No pulmonary edema. Stable cardio-mediastinal silhouette. No acute osseous abnormalities. The soft tissues are within normal limits. IMPRESSION: *New left basilar opacity, which may represent combination of atelectasis and/or pneumonia. There is associated trace left pleural effusion. Follow up to clearing is recommended. *Probable new trace right pleural effusion.  No pulmonary edema. Electronically Signed   By: Ree Molt M.D.   On: 08/20/2023 13:21    Assessment/Plan 1. Acute hypoxic respiratory failure (HCC) (Primary) Continues on 02 also reporting worsening shortness of breath, will send to ED for further evaluation  2. AKI (acute kidney injury) (HCC) Noted on labs, not eating or drinking well. Will send to ED for further evaluation  3. Lethargy -progressively worsening lethargy, hypotension, tachycardia noted, will send to ED for further evaluation     Ione [place] K. Caro BODILY [facility] [phone]

## 2023-09-18 ENCOUNTER — Inpatient Hospital Stay (HOSPITAL_COMMUNITY)
Admit: 2023-09-18 | Discharge: 2023-09-18 | Disposition: A | Discharge status: Home / Self Care | Payer: PPO | Attending: Nurse Practitioner | Admitting: Nurse Practitioner

## 2023-09-18 ENCOUNTER — Encounter: Payer: Self-pay | Admitting: Student in an Organized Health Care Education/Training Program

## 2023-09-18 DIAGNOSIS — R579 Shock, unspecified: Secondary | ICD-10-CM | POA: Diagnosis not present

## 2023-09-18 DIAGNOSIS — D696 Thrombocytopenia, unspecified: Secondary | ICD-10-CM | POA: Diagnosis not present

## 2023-09-18 DIAGNOSIS — D649 Anemia, unspecified: Secondary | ICD-10-CM

## 2023-09-18 DIAGNOSIS — I214 Non-ST elevation (NSTEMI) myocardial infarction: Secondary | ICD-10-CM

## 2023-09-18 DIAGNOSIS — E872 Acidosis, unspecified: Secondary | ICD-10-CM

## 2023-09-18 DIAGNOSIS — N179 Acute kidney failure, unspecified: Secondary | ICD-10-CM | POA: Diagnosis not present

## 2023-09-18 DIAGNOSIS — R7989 Other specified abnormal findings of blood chemistry: Secondary | ICD-10-CM

## 2023-09-18 DIAGNOSIS — I2510 Atherosclerotic heart disease of native coronary artery without angina pectoris: Secondary | ICD-10-CM | POA: Diagnosis not present

## 2023-09-18 LAB — APTT
aPTT: 112 s — ABNORMAL HIGH (ref 24–36)
aPTT: 77 s — ABNORMAL HIGH (ref 24–36)
aPTT: 72 s — ABNORMAL HIGH (ref 24–36)

## 2023-09-18 LAB — BLOOD GAS, ARTERIAL
Acid-base deficit: 8.6 mmol/L — ABNORMAL HIGH (ref 0.0–2.0)
Acid-base deficit: 9.1 mmol/L — ABNORMAL HIGH (ref 0.0–2.0)
Acid-base deficit: 11.8 mmol/L — ABNORMAL HIGH (ref 0.0–2.0)
Bicarbonate: 13.9 mmol/L — ABNORMAL LOW (ref 20.0–28.0)
Bicarbonate: 13.6 mmol/L — ABNORMAL LOW (ref 20.0–28.0)
Bicarbonate: 11 mmol/L — ABNORMAL LOW (ref 20.0–28.0)
FIO2: 21 %
O2 Content: 2 L/min
O2 Saturation: 98.1 %
O2 Saturation: 98.1 %
O2 Saturation: 99.1 %
Patient temperature: 37
Patient temperature: 37
Patient temperature: 37
pCO2 arterial: 22 mm[Hg] — ABNORMAL LOW (ref 32–48)
pCO2 arterial: 22 mm[Hg] — ABNORMAL LOW (ref 32–48)
pCO2 arterial: 19 mm[Hg] — CL (ref 32–48)
pH, Arterial: 7.41 (ref 7.35–7.45)
pH, Arterial: 7.4 (ref 7.35–7.45)
pH, Arterial: 7.37 (ref 7.35–7.45)
pO2, Arterial: 120 mm[Hg] — ABNORMAL HIGH (ref 83–108)
pO2, Arterial: 91 mm[Hg] (ref 83–108)
pO2, Arterial: 158 mm[Hg] — ABNORMAL HIGH (ref 83–108)

## 2023-09-18 LAB — CBC
HCT: 23.3 % — ABNORMAL LOW (ref 39.0–52.0)
HCT: 21.8 % — ABNORMAL LOW (ref 39.0–52.0)
Hemoglobin: 7.6 g/dL — ABNORMAL LOW (ref 13.0–17.0)
Hemoglobin: 7.1 g/dL — ABNORMAL LOW (ref 13.0–17.0)
MCH: 30.2 pg (ref 26.0–34.0)
MCH: 30 pg (ref 26.0–34.0)
MCHC: 32.6 g/dL (ref 30.0–36.0)
MCHC: 32.6 g/dL (ref 30.0–36.0)
MCV: 92.5 fL (ref 80.0–100.0)
MCV: 92 fL (ref 80.0–100.0)
Platelets: 88 10*3/uL — ABNORMAL LOW (ref 150–400)
Platelets: 74 10*3/uL — ABNORMAL LOW (ref 150–400)
RBC: 2.52 MIL/uL — ABNORMAL LOW (ref 4.22–5.81)
RBC: 2.37 MIL/uL — ABNORMAL LOW (ref 4.22–5.81)
RDW: 19.7 % — ABNORMAL HIGH (ref 11.5–15.5)
RDW: 20.3 % — ABNORMAL HIGH (ref 11.5–15.5)
WBC: 8.5 10*3/uL (ref 4.0–10.5)
WBC: 8.7 10*3/uL (ref 4.0–10.5)
nRBC: 0.2 % (ref 0.0–0.2)
nRBC: 0.5 % — ABNORMAL HIGH (ref 0.0–0.2)

## 2023-09-18 LAB — BASIC METABOLIC PANEL
Anion gap: 22 — ABNORMAL HIGH (ref 5–15)
Anion gap: 23 — ABNORMAL HIGH (ref 5–15)
BUN: 129 mg/dL — ABNORMAL HIGH (ref 8–23)
BUN: 75 mg/dL — ABNORMAL HIGH (ref 8–23)
CO2: 12 mmol/L — ABNORMAL LOW (ref 22–32)
CO2: 11 mmol/L — ABNORMAL LOW (ref 22–32)
Calcium: 6.8 mg/dL — ABNORMAL LOW (ref 8.9–10.3)
Calcium: 8.1 mg/dL — ABNORMAL LOW (ref 8.9–10.3)
Chloride: 101 mmol/L (ref 98–111)
Chloride: 103 mmol/L (ref 98–111)
Creatinine, Ser: 6.09 mg/dL — ABNORMAL HIGH (ref 0.61–1.24)
Creatinine, Ser: 3.56 mg/dL — ABNORMAL HIGH (ref 0.61–1.24)
GFR, Estimated: 9 mL/min — ABNORMAL LOW (ref >60)
GFR, Estimated: 17 mL/min — ABNORMAL LOW (ref >60)
Glucose, Bld: 141 mg/dL — ABNORMAL HIGH (ref 70–99)
Glucose, Bld: 136 mg/dL — ABNORMAL HIGH (ref 70–99)
Potassium: 5 mmol/L (ref 3.5–5.1)
Potassium: 4.5 mmol/L (ref 3.5–5.1)
Sodium: 135 mmol/L (ref 135–145)
Sodium: 137 mmol/L (ref 135–145)

## 2023-09-18 LAB — ECHOCARDIOGRAM LIMITED
Est EF: >55
Height: 70 in
S' Lateral: 3.1 cm
Weight: 3887.15 [oz_av]

## 2023-09-18 LAB — PREPARE PLATELET PHERESIS: Unit division: 0

## 2023-09-18 LAB — LACTIC ACID, PLASMA
Lactic Acid, Venous: 6.4 mmol/L (ref 0.5–1.9)
Lactic Acid, Venous: 6.9 mmol/L (ref 0.5–1.9)
Lactic Acid, Venous: 7.7 mmol/L (ref 0.5–1.9)
Lactic Acid, Venous: 8.4 mmol/L (ref 0.5–1.9)
Lactic Acid, Venous: >9 mmol/L (ref 0.5–1.9)
Lactic Acid, Venous: >9 mmol/L (ref 0.5–1.9)

## 2023-09-18 LAB — GLUCOSE, CAPILLARY
Glucose-Capillary: 111 mg/dL — ABNORMAL HIGH (ref 70–99)
Glucose-Capillary: 116 mg/dL — ABNORMAL HIGH (ref 70–99)
Glucose-Capillary: 118 mg/dL — ABNORMAL HIGH (ref 70–99)
Glucose-Capillary: 119 mg/dL — ABNORMAL HIGH (ref 70–99)
Glucose-Capillary: 125 mg/dL — ABNORMAL HIGH (ref 70–99)
Glucose-Capillary: 129 mg/dL — ABNORMAL HIGH (ref 70–99)

## 2023-09-18 LAB — RENAL FUNCTION PANEL
Albumin: 2.6 g/dL — ABNORMAL LOW (ref 3.5–5.0)
Anion gap: 21 — ABNORMAL HIGH (ref 5–15)
BUN: 81 mg/dL — ABNORMAL HIGH (ref 8–23)
CO2: 13 mmol/L — ABNORMAL LOW (ref 22–32)
Calcium: 8.2 mg/dL — ABNORMAL LOW (ref 8.9–10.3)
Chloride: 105 mmol/L (ref 98–111)
Creatinine, Ser: 3.61 mg/dL — ABNORMAL HIGH (ref 0.61–1.24)
GFR, Estimated: 16 mL/min — ABNORMAL LOW (ref >60)
Glucose, Bld: 127 mg/dL — ABNORMAL HIGH (ref 70–99)
Phosphorus: 5.8 mg/dL — ABNORMAL HIGH (ref 2.5–4.6)
Potassium: 4.6 mmol/L (ref 3.5–5.1)
Sodium: 139 mmol/L (ref 135–145)

## 2023-09-18 LAB — BPAM PLATELET PHERESIS
Blood Product Expiration Date: 202501092359
ISSUE DATE / TIME: 202501092242
Unit Type and Rh: 5100

## 2023-09-18 LAB — IMMATURE PLATELET FRACTION: Immature Platelet Fraction: 8.6 % (ref 1.2–8.6)

## 2023-09-18 LAB — PHOSPHORUS: Phosphorus: 8.2 mg/dL — ABNORMAL HIGH (ref 2.5–4.6)

## 2023-09-18 LAB — URINE CULTURE: Culture: NO GROWTH

## 2023-09-18 LAB — MAGNESIUM: Magnesium: 2.3 mg/dL (ref 1.7–2.4)

## 2023-09-18 LAB — ABO/RH: ABO/RH(D): A POS

## 2023-09-18 LAB — PREPARE RBC (CROSSMATCH)

## 2023-09-18 LAB — ALBUMIN: Albumin: 2.5 g/dL — ABNORMAL LOW (ref 3.5–5.0)

## 2023-09-18 LAB — VITAMIN B12: Vitamin B-12: 3303 pg/mL — ABNORMAL HIGH (ref 180–914)

## 2023-09-18 MED ORDER — VITAMIN C 500 MG PO TABS
500.0000 mg | ORAL_TABLET | Freq: Two times a day (BID) | ORAL | Status: DC
  Administered 2023-09-18: 500 mg via ORAL
  Filled 2023-09-18: qty 1

## 2023-09-18 MED ORDER — RENA-VITE PO TABS
1.0000 | ORAL_TABLET | Freq: Every day | ORAL | Status: DC
  Administered 2023-09-18: 1
  Filled 2023-09-18: qty 1

## 2023-09-18 MED ORDER — RENA-VITE PO TABS
1.0000 | ORAL_TABLET | Freq: Every day | ORAL | Status: DC
Start: 2023-09-19 — End: 2023-09-19

## 2023-09-18 MED ORDER — CALCIUM GLUCONATE-NACL 1-0.675 GM/50ML-% IV SOLN
1.0000 g | Freq: Once | INTRAVENOUS | Status: AC
  Administered 2023-09-18: 1000 mg via INTRAVENOUS
  Filled 2023-09-18: qty 50

## 2023-09-18 MED ORDER — PIPERACILLIN-TAZOBACTAM 3.375 G IVPB
3.3750 g | Freq: Four times a day (QID) | INTRAVENOUS | Status: DC
  Administered 2023-09-18 – 2023-09-19 (×5): 3.375 g via INTRAVENOUS
  Filled 2023-09-18 (×5): qty 50

## 2023-09-18 MED ORDER — NOREPINEPHRINE 16 MG/250ML-% IV SOLN
0.0000 ug/min | INTRAVENOUS | Status: DC
  Administered 2023-09-18: 2 ug/min via INTRAVENOUS
  Administered 2023-09-19: 8 ug/min via INTRAVENOUS
  Filled 2023-09-18 (×2): qty 250

## 2023-09-18 MED ORDER — PRISMASOL BGK 0/2.5 32-2.5 MEQ/L EC SOLN
Status: DC
  Filled 2023-09-18 (×4): qty 5000

## 2023-09-18 MED ORDER — VASOPRESSIN 20 UNITS/100 ML INFUSION FOR SHOCK
0.0000 [IU]/min | INTRAVENOUS | Status: DC
  Administered 2023-09-18 – 2023-09-19 (×3): 0.03 [IU]/min via INTRAVENOUS
  Filled 2023-09-18 (×3): qty 100

## 2023-09-18 MED ORDER — HEPARIN SODIUM (PORCINE) 1000 UNIT/ML DIALYSIS
1000.0000 [IU] | INTRAMUSCULAR | Status: DC | PRN
Start: 2023-09-18 — End: 2023-09-19

## 2023-09-18 MED ORDER — PRISMASOL BGK 0/2.5 32-2.5 MEQ/L EC SOLN
Status: DC
  Filled 2023-09-18 (×14): qty 5000

## 2023-09-18 MED ORDER — SODIUM CHLORIDE 0.9 % FOR CRRT: INTRAVENOUS_CENTRAL | Status: DC | PRN

## 2023-09-18 MED ORDER — NEPRO/CARBSTEADY PO LIQD: 237.0000 mL | Freq: Three times a day (TID) | ORAL | Status: DC

## 2023-09-18 MED ORDER — ARGATROBAN 50 MG/50ML IV SOLN
0.3500 ug/kg/min | INTRAVENOUS | Status: DC
Start: 2023-09-18 — End: 2023-09-19
  Administered 2023-09-18 (×2): 0.35 ug/kg/min via INTRAVENOUS
  Filled 2023-09-18 (×2): qty 50

## 2023-09-18 MED ORDER — CHLORHEXIDINE GLUCONATE CLOTH 2 % EX PADS
6.0000 | MEDICATED_PAD | Freq: Every day | CUTANEOUS | Status: DC
  Administered 2023-09-18 – 2023-09-19 (×2): 6 via TOPICAL

## 2023-09-18 MED ORDER — NOREPINEPHRINE 4 MG/250ML-% IV SOLN: 0.0000 ug/min | INTRAVENOUS | Status: DC

## 2023-09-18 NOTE — Consult Note (Signed)
 Pharmacy Antibiotic Note  Kurt Aguilar is a 78 y.o. male admitted on 09/17/2023 with possible sepsis. PMH is significant for hypertension, diabetes, hyperlipidemia, gout, CAD status post CABG in 1997, GERD, polycystic kidney disease, and chronic kidney disease stage IV. Pharmacy has been consulted for Zosyn  dosing.  Today, 09/18/2023 Day 2 of Zosyn  WBC WNL Afebrile  CRRT started 01/10  1/9 CXR shows improving left pleural effusion UA unremarkable   Plan: adjust Zosyn  to 3.375 grams IV Q6H Pharmacy will continue to monitor and dose adjust appropriately   Height: 5' 10 (177.8 cm) Weight: 110.2 kg (242 lb 15.2 oz) IBW/kg (Calculated) : 73  Temp (24hrs), Avg:98 F (36.7 C), Min:96 F (35.6 C), Max:99.1 F (37.3 C)  Recent Labs  Lab 09/16/23 0000 09/17/23 1218 09/17/23 1408 09/17/23 1549 09/17/23 1722 09/17/23 1949 09/17/23 1950 09/18/23 0140 09/18/23 0505 09/18/23 0507 09/18/23 0636  WBC 7.5 5.8  --  8.0  --   --  8.2  --   --   --  8.5  CREATININE 5.4* 7.03*  --   --  6.89* 7.13*  --   --   --  6.09*  --   LATICACIDVEN  --  6.3* 4.7*  --   --   --  4.7* 6.4* 6.9*  --   --     Estimated Creatinine Clearance: 12.2 mL/min (A) (by C-G formula based on SCr of 6.09 mg/dL (H)).    Allergies  Allergen Reactions   Oxycodone  Anaphylaxis    02/25/2022    Antimicrobials this admission: Zosyn  1/9 >>   Microbiology results: 1/9 BCx: NGTD 1/9 UCx: pending 1/9 Resp panel: negative 1/9 MRSA PCR: negative  Thank you for allowing pharmacy to be a part of this patient's care.  Adriana JONETTA Bolster, PharmD, BCPS 09/18/2023 7:27 AM

## 2023-09-18 NOTE — Consult Note (Signed)
 PHARMACY - ANTICOAGULATION CONSULT NOTE  Pharmacy Consult for Argatroban  Indication: chest pain/ACS - Suspected HIT  Allergies  Allergen Reactions   Oxycodone  Anaphylaxis    02/25/2022    Patient Measurements: Height: 5' 10 (177.8 cm) Weight: 110.2 kg (242 lb 15.2 oz) IBW/kg (Calculated) : 73 Heparin  Dosing Weight: 96.2 kg  Vital Signs: Temp: 97.9 F (36.6 C) (01/10 1600) Temp Source: Axillary (01/10 1600) BP: 109/78 (01/10 1600) Pulse Rate: 42 (01/10 1815)  Labs: Recent Labs    09/17/23 1218 09/17/23 1408 09/17/23 1549 09/17/23 1722 09/17/23 1949 09/17/23 1950 09/18/23 0129 09/18/23 0507 09/18/23 0636 09/18/23 0758 09/18/23 1342 09/18/23 1710 09/18/23 1815  HGB 7.9*  --    < >  --   --  6.7*  --   --  7.6*  --  7.1*  --   --   HCT 25.7*  --    < >  --   --  20.7*  --   --  23.3*  --  21.8*  --   --   PLT 66*  --    < >  --   --  54*  --   --  88*  --  74*  --   --   APTT 28  --   --   --   --  >200* 112*  --   --  77*  --   --  72*  LABPROT 15.7*  --   --   --   --   --   --   --   --   --   --   --   --   INR 1.2  --   --   --   --   --   --   --   --   --   --   --   --   CREATININE 7.03*  --   --  6.89* 7.13*  --   --  6.09*  --   --   --  3.61*  --   CKTOTAL  --   --   --  129  --   --   --   --   --   --   --   --   --   TROPONINIHS 1,451* 1,320*  --   --   --   --   --   --   --   --   --   --   --    < > = values in this interval not displayed.   Estimated Creatinine Clearance: 20.6 mL/min (A) (by C-G formula based on SCr of 3.61 mg/dL (H)).  Medical History: Past Medical History:  Diagnosis Date   Aortic stenosis    a. 08/2023 Echo: EF 55-60%, no rwma, mild LVH, nl RV fxn, mod dil LA, mild MS, mild-mod AS.   Arthritis    Asthma    CAD (coronary artery disease)    a. 1997 CABG x 5 [place], [place]): LIMA->LAD, VG->D1, VG->OM1->OM2, VG->dRCA; b. 09/2021 NSTEMI/Cath: LM 99ost, LAD diff dzs, D1 100, D2/3 sev dzs, LCX 100ost/p, RCA 100p, VG->dRCA 70ost/p,  VG->OM1->OM2 ok, LIMA->LAD ok, VG->D1 ok-->Med rx.   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 01/2023 Zio: predominantly sinus rhythm @ 100 (58-176).  2 SVT runs, fastest 176, longest 16 beats. Rare PACs/PVCs. No triggered events.   Sleep apnea    cpapp - setting at 17    Tracheobronchomalacia    Type II diabetes mellitus (HCC)    type 2    Medications:  No anticoagulation per chart review  Assessment: 78 yo male presenting for confusion and lethargy. In Ed, found to have elevated troponin and pharmacy was consulted to initiate heparin  infusion. On presentation, patient had low platelet count of 81, which trended down this AM to 66. Per ED provider, heparin  bolus and infusion started at 1318 today, as patient had concern for ACS, hgb was low but stable, and no signs of bleeding.  Upon recheck of CBC, patient's platelets are now 54. Pharmacy has been consulted to transition to argatroban  out of concern for possible heparin  induced thrombocytopenia.  Patient does not have a documented history of thrombocytopenia and, of note, patient was hospitalized recently (08/2023) for HCAP and they received heparin  infusion from 12/22 - 12/24 at 1,000 units/hr as well as subcutaneous heparin  injections (5,000 units q8hours) from 12/24-12/29. During that admission, patient had a stable platelet count in mid to low 200s.   Baseline labs: aPTT 28s hgb 6.9, plt 54, INR 1.2  Date Time aPTT Rate/Comment 1/10 0758 77 Therapeutic x 1 1/10 1815 72 Therapeutic x 2  Goal of Therapy:  aPTT 50-90  seconds Monitor platelets by anticoagulation protocol: Yes   Plan: aPTT therapeutic Continue argatroban  at 0.35 mcg/kg/min  Check aPTT next with AM labs on 1/11 Monitor CBC and H&H daily   Thank you for involving pharmacy in this patient's care.   Will M. Lenon, PharmD Clinical Pharmacist 09/18/2023 7:11 PM

## 2023-09-18 NOTE — Evaluation (Signed)
 Clinical/Bedside Swallow Evaluation Patient Details  Name: Kurt Aguilar MRN: [id number] Date of Birth: [birth year]  Today's Date: 09/18/2023 Time: SLP Start Time (ACUTE ONLY): 1158 SLP Stop Time (ACUTE ONLY): 1250 SLP Time Calculation (min) (ACUTE ONLY): 52 min  Past Medical History:  Past Medical History:  Diagnosis Date   Aortic stenosis    a. 08/2023 Echo: EF 55-60%, no rwma, mild LVH, nl RV fxn, mod dil LA, mild MS, mild-mod AS.   Arthritis    Asthma    CAD (coronary artery disease)    a. 1997 CABG x 5 [facility], [place]): LIMA->LAD, VG->D1, VG->OM1->OM2, VG->dRCA; b. 09/2021 NSTEMI/Cath: LM 99ost, LAD diff dzs, D1 100, D2/3 sev dzs, LCX 100ost/p, RCA 100p, VG->dRCA 70ost/p, VG->OM1->OM2 ok, LIMA->LAD ok, VG->D1 ok-->Med rx.   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 01/2023 Zio: predominantly sinus rhythm @ 100 (58-176).  2 SVT runs, fastest 176, longest 16 beats. Rare PACs/PVCs. No triggered events.   Sleep apnea    cpapp - setting at 17    Tracheobronchomalacia    Type II diabetes mellitus (HCC)    type 2    Past Surgical History:  Past Surgical History:  Procedure Laterality Date   ACROMIO-CLAVICULAR JOINT REPAIR Left 05/05/2019   Procedure: SHOULDER ARTHROSCOPIC ASSISTED ACROMIO-CLAVICULAR JOINT REPAIR;  Surgeon: Dozier Soulier, MD;  Location: [facility];  Service: Orthopedics;  Laterality: Left;   CATARACT EXTRACTION Bilateral    CORONARY ARTERY BYPASS GRAFT     [place]    LEFT HEART CATH AND CORS/GRAFTS ANGIOGRAPHY N/A 09/19/2021   Procedure: LEFT HEART CATH  AND CORS/GRAFTS ANGIOGRAPHY;  Surgeon: Claudene Victory ORN, MD;  Location: [facility];  Service: Cardiovascular;  Laterality: N/A;   open heart surgery  09/09/1995-1998   5 bypass   REPAIR KNEE LIGAMENT     right   SHOULDER ARTHROSCOPY Left 05/05/2019   Procedure: ARTHROSCOPY SHOULDER;  Surgeon: Dozier Soulier, MD;  Location: [facility];  Service: Orthopedics;  Laterality: Left;   TONSILLECTOMY     TOTAL KNEE ARTHROPLASTY Left 05/29/2020   Procedure: LEFT TOTAL KNEE ARTHROPLASTY;  Surgeon: Sheril Coy, MD;  Location: [facility];  Service: Orthopedics;  Laterality: Left;   HPI:  78 y/o male with h/o tracheobronchomalacia, sleep apnea on CPAP, PSVT, aortic stenosis, CAD s/p CABG, HLD, Obesity, gout, OSA, HTN, DM, renal cyst, CKD IV, CHF, NSTEMI, GERD, diverticulitis, BPH and recent admission for acute hypoxic respiratory failure secondary to rhinovirus pneumonia with superimposed bacterial pneumonia with MRSE and who is now admitted with shock, NSTEMI,  thrombocytopenia, AKI, anemia, acute toxic metabolic encephalopathy and adrenal insufficiency. Back Pain and spasms also reported w/ recent Fall at [facility] on first day of admit.  Pt has had 3 admits in last 2 months per chart.  Unsure of his baseline Cognition.   CXR: Enlarged heart. Improving left pleural effusion.    Assessment / Plan / Recommendation  Clinical Impression   Pt seen for CSE today. Pt  awakened and could verbally respond to a basic question(s) re: self given cues and Time. Eyes closed often. Cues required throughout session. Wife present.  On RA, afebrile. WBC wnl.  OF NOTE: Pt has a baseline h/o eating well w/out difficulty but oral intake has been decreased in recent days as pt has been in/out of the hospital and SNF Rehab per chart.  Pt appears to present w/ oropharyngeal phase dysphagia in setting of declined Mental/Cognitive status currently; State decline+. ANY Cognitive decline can impact overall awareness/timing of swallow and  safety during po tasks which increases risk for aspiration, choking. Pt also has a Baseline of sleep apnea and is supposed to wear his CPAP -- noted sleep apnea episodes during this session.  Pt's risk for aspiration can be reduced when following general aspiration precautions and using a modified diet consistency of pureed foods for cohesion and Nectar consistency liquids for the viscosity; cues for follow through.        Post heavy positioning, pt consumed trials of ice chips, purees, thin and Nectar liquids via tsp/cup/straw w/ overt clinical s/s of aspiration(coughing 2/2 trials) noted w/ thin liquids vis cup. W/ other consistencies/trials, no overt clinical s/s of aspiration noted: no decline in vocal quality; no cough, and no decline in respiratory status during/post trials. O2 sats remained 98% during session. Oral phase was adequate for bolus management and oral clearing of the boluses given. No trials of solids were attempted but mastication of ice chips was appropriate for bolus management. Pt attempted self-feeding(holding Cup) but required MAX support and guidance d/t the mental status.  OM Exam appeared grossly [facility] w/ No unilateral weakness noted. Some confusion of OM tasks and oral care noted. Hand over hand guidance and visual cue were helpful to initiate oral care task.         In setting of Mental status decline and State presentation w/ Missing Dentition, recommend initiation of the dysphagia level 1(Pureed) foods moistened for ease; Nectar liquids. General aspiration precautions; reduce Distractions during meals and engage pt during meals for self-feeding. Pills Crushed in Puree for safer swallowing as needed. Supervision and Support w/ feeding at meals- MUST be fully awake for oral intake. MD/NSG updated.  ST services will continue to follow pt while admitted for trials to upgrade diet consistency as appropriate, safe. Noted f/u w/ Palliative Care for GOC ordered. Dietician following.  Precautions posted in room, chart. SLP Visit Diagnosis: Dysphagia, oropharyngeal phase (R13.12) (in setting of mental status and State decline currently)    Aspiration Risk  Moderate aspiration risk;Risk for inadequate nutrition/hydration    Diet Recommendation   Nectar;Dysphagia 1 (puree) = dysphagia level 1(Pureed) foods moistened for ease; Nectar liquids. General aspiration precautions; reduce Distractions during meals and engage pt during meals for self-feeding. Pills Crushed in Puree for safer swallowing as needed. Supervision and Support w/ feeding at meals- MUST be fully awake for oral intake.  Medication Administration: Crushed with puree    Other  Recommendations Recommended Consults:  (Dietician f/u; Palliative Care ordered) Oral Care Recommendations: Oral care BID;Oral care before and after PO;Staff/trained caregiver to provide oral care Caregiver Recommendations: Avoid jello, ice cream, thin soups, popsicles;Remove water pitcher;Have oral suction available    Recommendations for follow up therapy are one component of a multi-disciplinary discharge planning process, led by the attending physician.  Recommendations may be updated based on patient status, additional functional criteria and insurance authorization.  Follow up Recommendations Follow physician's recommendations for discharge plan and follow up therapies (  TBD)      Assistance Recommended at Discharge  FULL  Functional Status Assessment Patient has had a recent decline in their functional status and demonstrates the ability to make significant improvements in function in a reasonable and predictable amount of time.  Frequency and Duration min 2x/week  2 weeks       Prognosis Prognosis for improved oropharyngeal function: Fair Barriers to Reach Goals: Cognitive deficits;Time post onset;Severity of deficits Barriers/Prognosis Comment: declined State and mental status presentation currently      Swallow Study    General Date of Onset: 09/17/23 HPI: 78 y/o male with h/o tracheobronchomalacia, sleep apnea on CPAP, PSVT, aortic stenosis, CAD s/p CABG, HLD, Obesity, gout, OSA, HTN, DM, renal cyst, CKD IV, CHF, NSTEMI, GERD, diverticulitis, BPH and recent admission for acute hypoxic respiratory failure secondary to rhinovirus pneumonia with superimposed bacterial pneumonia with MRSE and who is now admitted with shock, NSTEMI,  thrombocytopenia, AKI, anemia, acute toxic metabolic encephalopathy and adrenal insufficiency. Back Pain and spasms also reported w/ recent Fall at [facility] on first day of admit.  Pt has had 3 admits in last 2 months per chart.  Unsure of his baseline Cognition.   CXR: Enlarged heart. Improving left pleural effusion. Type of Study: Bedside Swallow Evaluation Previous Swallow Assessment: none Diet Prior to this Study: NPO Temperature Spikes Noted: No (wbc 8.7) Respiratory Status: Room air (98-99%) History of Recent Intubation: No Behavior/Cognition: Alert;Cooperative;Pleasant mood;Confused;Distractible;Requires cueing (min drowsy) Oral Cavity Assessment: Dry (min) Oral Care Completed by SLP: Yes Oral Cavity - Dentition: Missing dentition Vision:  (n/a) Self-Feeding Abilities: Total assist (weak, confusion) Patient Positioning: Upright in bed (needed full support) Baseline Vocal Quality: Low vocal intensity (wfl) Volitional Cough: Cognitively unable to elicit Volitional Swallow: Unable to elicit    Oral/Motor/Sensory Function Overall Oral Motor/Sensory Function: Within functional limits (grossly)   Ice Chips Ice chips: Within functional limits Presentation: Spoon (10 trials) Other Comments: munched immediately   Thin Liquid Thin Liquid: Impaired Presentation: Cup;Self Fed (fully supported holding cup: 2 trials) Oral Phase Impairments: Poor awareness of bolus Pharyngeal  Phase Impairments: Suspected delayed Swallow;Cough - Immediate (2/2 trials)    Nectar Thick Nectar Thick Liquid:  Within functional limits Presentation: Spoon;Straw (fed: 10 trials total)   Honey Thick Honey Thick Liquid: Not tested   Puree Puree: Within functional limits Presentation: Spoon (fed; 12 trials)   Solid     Solid: Not tested Other Comments: d/t mental status        Comer Portugal, MS, CCC-SLP Speech Language Pathologist Rehab Services; [facility] [phone] (ascom) Nyiah Pianka 09/18/2023,2:47 PM

## 2023-09-18 NOTE — Progress Notes (Signed)
   09/18/23 1345  Spiritual Encounters  Type of Visit Initial  Care provided to: Pt and family  Referral source Chaplain assessment  Reason for visit Routine spiritual support  OnCall Visit No  Spiritual Framework  Presenting Themes Other (comment) (Introduced Orthoptist services to Pt and Wife; Pt from [employer])  Values/beliefs  (Wife stated they are Catholic)  Community/Connection Family  Family Stress Factors Exhausted  Interventions  Spiritual Care Interventions Made Established relationship of care and support;Compassionate presence;Reflective listening;Explored values/beliefs/practices/strengths  Intervention Outcomes  Outcomes Connection to spiritual care   Pt smiled continuously but not responsive to questions; Wife at bedside and stated they are Catholic; offered to contact [facility]; Wife already has contact, open to visits

## 2023-09-18 NOTE — Progress Notes (Signed)
 PT Cancellation Note  Patient Details Name: Kurt Aguilar MRN: [id number] DOB: [birth year]   Cancelled Treatment:    Reason Eval/Treat Not Completed: Patient not medically ready. Orders received and chart reviewed. Per RN pt not medically ready for PT/OT assessments requesting to hold. PT to re-attempt at a later time/date.    Dorina HERO. Fairly IV, PT, DPT Physical Therapist- [place]  [facility]  09/18/2023, 12:44 PM

## 2023-09-18 NOTE — Progress Notes (Signed)
 PHARMACY CONSULT NOTE - FOLLOW UP  Pharmacy Consult for Electrolyte Monitoring and Replacement   Recent Labs: Potassium (mmol/L)  Date Value  09/18/2023 5.0   Magnesium  (mg/dL)  Date Value  98/89/7974 2.3   Calcium  (mg/dL)  Date Value  98/89/7974 6.8 (L)   Albumin  (g/dL)  Date Value  98/89/7974 2.5 (L)   Phosphorus (mg/dL)  Date Value  98/89/7974 8.2 (H)   Sodium  Date Value  09/18/2023 135 mmol/L  09/16/2023 142   Corrected Ca: 8.0 mg/dL   Assessment: 78 y.o. male admitted on 09/17/2023 with possible sepsis. PMH is significant for hypertension, diabetes, hyperlipidemia, gout, CAD status post CABG in 1997, GERD, polycystic kidney disease, and chronic kidney disease stage IV.   Goal of Therapy:  Electrolytes WNL  Plan:  ---1 gram calcium  gluconate IV x 1 ---renal function panel ordered BID while on CRRT  Kurt Aguilar ,PharmD Clinical Pharmacist 09/18/2023 7:00 AM

## 2023-09-18 NOTE — Plan of Care (Signed)
  Problem: Education: Goal: Knowledge of General Education information will improve Description: Including pain rating scale, medication(s)/side effects and non-pharmacologic comfort measures Outcome: Progressing   Problem: Health Behavior/Discharge Planning: Goal: Ability to manage health-related needs will improve Outcome: Progressing   Problem: Clinical Measurements: Goal: Will remain free from infection Outcome: Progressing Goal: Respiratory complications will improve Outcome: Progressing   Problem: Coping: Goal: Level of anxiety will decrease Outcome: Progressing   Problem: Elimination: Goal: Will not experience complications related to bowel motility Outcome: Progressing Goal: Will not experience complications related to urinary retention Outcome: Progressing   Problem: Pain Management: Goal: General experience of comfort will improve Outcome: Progressing   Problem: Safety: Goal: Ability to remain free from injury will improve Outcome: Progressing   Problem: Skin Integrity: Goal: Risk for impaired skin integrity will decrease Outcome: Progressing   Problem: Education: Goal: Ability to describe self-care measures that may prevent or decrease complications (Diabetes Survival Skills Education) will improve Outcome: Progressing Goal: Individualized Educational Video(s) Outcome: Progressing   Problem: Coping: Goal: Ability to adjust to condition or change in health will improve Outcome: Progressing   Problem: Health Behavior/Discharge Planning: Goal: Ability to identify and utilize available resources and services will improve Outcome: Progressing Goal: Ability to manage health-related needs will improve Outcome: Progressing   Problem: Metabolic: Goal: Ability to maintain appropriate glucose levels will improve Outcome: Progressing   Problem: Nutritional: Goal: Maintenance of adequate nutrition will improve Outcome: Progressing Goal: Progress toward  achieving an optimal weight will improve Outcome: Progressing   Problem: Clinical Measurements: Goal: Ability to maintain clinical measurements within normal limits will improve Outcome: Not Progressing Goal: Diagnostic test results will improve Outcome: Not Progressing Goal: Cardiovascular complication will be avoided Outcome: Not Progressing   Problem: Activity: Goal: Risk for activity intolerance will decrease Outcome: Not Progressing   Problem: Nutrition: Goal: Adequate nutrition will be maintained Outcome: Not Progressing   Problem: Fluid Volume: Goal: Ability to maintain a balanced intake and output will improve Outcome: Not Progressing   Problem: Skin Integrity: Goal: Risk for impaired skin integrity will decrease Outcome: Not Progressing   Problem: Tissue Perfusion: Goal: Adequacy of tissue perfusion will improve Outcome: Not Progressing

## 2023-09-18 NOTE — Progress Notes (Signed)
 Initial Nutrition Assessment  DOCUMENTATION CODES:   Obesity unspecified  INTERVENTION:   RD will add supplements pending SLP evaluation.   Rena-vit po daily with diet advancement   Vitamin C  500mg  po BID with diet advancement   Recommend NGT placement and nutrition support if patient is unable to be initiated on an oral diet within the next 48hrs.   Pt at high refeed risk  Daily weights   NUTRITION DIAGNOSIS:   Inadequate oral intake related to acute illness as evidenced by per patient/family report.  GOAL:   Patient will meet greater than or equal to 90% of their needs  MONITOR:   Diet advancement, Labs, Weight trends, I & O's, Skin  REASON FOR ASSESSMENT:   Consult Assessment of nutrition requirement/status  ASSESSMENT:   78 y/o male with h/o tracheobronchomalacia, PSVT, aortic stenosis, CAD s/p CABG, HLD, gout, OSA, HTN, DM, renal cyst, CKD IV, CHF, NSTEMI, GERD, diverticulitis, BPH and recent admission for acute hypoxic respiratory failure secondary to rhinovirus pneumonia with superimposed bacterial pneumonia with MRSE and who is now admitted with shock, NSTEMI,  thrombocytopenia, AKI, anemia, acute toxic metabolic encephalopathy and adrenal insufficiency.  Met with pt and pt's wife in room today. Pt with AMS and is unable to provide any reliable history. Wife reports pt with good appetite and oral intake at baseline but reports pt's oral intake has been poor for several days. Wife reports that patient has only eaten grapes for the past 3 days. Pt has been residing at [facility] since his last admission; pt was documented to be eating 100% of meals at that time. SLP evaluation is pending. Pt may require NGT placement and nutrition support if he in unable to be initiated on a diet; this was discussed with pt's wife. RD will add supplements with diet advancement. Pt is at high refeed risk. CRRT initiated yesterday.   Per chart, pt appears to be up ~9lbs from his last  admission. Wife reports that pt had lost ~10lbs pta. RD unsure if pt has had any recent weight changes.    Medications reviewed and include: solu-cortef , insulin , calcium  gluconate, levophed , zosyn , vasopressin    Labs reviewed: K 5.0 wnl, BUN 129(H), creat 6.09(H), P 8.2(H), Mg 2.3 wnl Hgb 7.6(L), Hct 23.3(L) Cbgs- 118, 129, 111 x 24 hrs  AIC 8.2- 1/8  NUTRITION - FOCUSED PHYSICAL EXAM:  Flowsheet Row Most Recent Value  Orbital Region No depletion  Upper Arm Region No depletion  Thoracic and Lumbar Region No depletion  Buccal Region No depletion  Temple Region No depletion  Clavicle Bone Region Mild depletion  Clavicle and Acromion Bone Region Mild depletion  Scapular Bone Region No depletion  Dorsal Hand No depletion  Patellar Region Mild depletion  Anterior Thigh Region Mild depletion  Posterior Calf Region Mild depletion  Edema (RD Assessment) None  Hair Reviewed  Eyes Reviewed  Mouth Reviewed  Skin Reviewed  Nails Reviewed   Diet Order:   Diet Order             Diet NPO time specified  Diet effective now                  EDUCATION NEEDS:   Education needs have been addressed  Skin:  Skin Assessment: Reviewed RN Assessment (Stage I sacrum, skin tears, ecchymosis)  Last BM:  1/10- TYPE 6  Height:   Ht Readings from Last 1 Encounters:  09/17/23 5' 10 (1.778 m)    Weight:   Wt Readings from  Last 1 Encounters:  09/18/23 110.2 kg    Ideal Body Weight:  75.45 kg  BMI:  Body mass index is 34.86 kg/m.  Estimated Nutritional Needs:   Kcal:  2200-2500kcal/day  Protein:  110-125g/day  Fluid:  1.9-2.2L/day  Augustin Shams MS, RD, LDN If unable to be reached, please send secure chat to RD inpatient available from 8:00a-4:00p daily

## 2023-09-18 NOTE — Procedures (Signed)
 CENTRAL VENOUS CATHETER INSERTION PROCEDURE NOTE  VALGENE DELOATCH  [id number]  May 27, 1946  Date:09/18/23  Time:4:19 AM   Provider Performing:Nereyda Bowler A Kathrene   Procedure: Insertion of Non-tunneled Central Venous [phone]) with US  guidance (23062)   Indication(s) Hemodialysis  Consent Risks of the procedure as well as the alternatives and risks of each were explained to the patient and/or caregiver.  Consent for the procedure was obtained and is signed in the bedside chart  Anesthesia Topical only with 1% lidocaine    Timeout Verified patient identification, verified procedure, site/side was marked, verified correct patient position, special equipment/implants available, medications/allergies/relevant history reviewed, required imaging and test results available.  Sterile Technique Maximal sterile technique including full sterile barrier drape, hand hygiene, sterile gown, sterile gloves, mask, hair covering, sterile ultrasound probe cover (if used).  Procedure Description Area of catheter insertion was cleaned with chlorhexidine  and draped in sterile fashion.  With real-time ultrasound guidance a central venous catheter was placed into the left femoral vein. Nonpulsatile blood flow and easy flushing noted in all ports.  The catheter was sutured in place and sterile dressing applied.  Complications/Tolerance None; patient tolerated the procedure well. Chest X-ray is ordered to verify placement for internal jugular or subclavian cannulation.   Chest x-ray is not ordered for femoral cannulation.  EBL Minimal  Specimen(s) None  Almarie Kathrene, DNP, CCRN, FNP-C, AGACNP-BC Acute Care & Family Nurse Practitioner  [facility]  See Amion for personal pager PCCM on call pager [phone] until 7 am

## 2023-09-18 NOTE — Progress Notes (Signed)
 [facility] [place], [place] 09/18/23  Subjective:   Hospital day # 1  Patient profile: Patient known to our practice from outpatient follow-up.  Last seen on August 17, 2023.  He has medical history of coronary disease status post CABG, chronic diastolic CHF with grade 1 diastolic dysfunction, hypertension, hyperlipidemia, gout, steroid-dependent reactive airway disease, osteoarthritis, polycystic kidney disease, history of COVID infection February 2024.  His CKD is presumed to be secondary to type 2 diabetes, hypertension and polycystic kidney disease.  His baseline creatinine is 2.65/GFR 24 from August 18, 2023.   History of present illness: He presented to the emergency room via EMS from [facility] due to altered mental status.  His wife reports that he has had decreased oral intake for the past 3 days.  He had a fall and has been hurting in his back.  Recently his blood pressure has been low and his creatinine been increasing.  As per notes, he was recently started on hydrocodone  and Zanaflex for severe back spasms.  Evaluation in the ER revealed elevated leg, elevated troponin high potassium and creatinine of 7.  Given hyperkalemia, low urine but and critical illness, he was emergently started on CRRT.  This morning, he is confused.  He is able to recognize his wife.  He he is not oriented to time or place.  He could not recognize his daughter. He is responding well to CRRT.  BUN and creatinine potassium is now in the normal range.  He remains on pressors.      Objective:  Vital signs in last 24 hours:  Temp:  [96 F (35.6 C)-99.5 F (37.5 C)] 97.9 F (36.6 C) (01/10 1200) Pulse Rate:  [35-94] 44 (01/10 1400) Resp:  [12-30] 15 (01/10 1400) BP: (71-182)/(40-149) 101/67 (01/10 1200) SpO2:  [96 %-100 %] 100 % (01/10 1400) Arterial Line BP: (85-153)/(33-69) 149/45 (01/10 1545) Weight:  [110.2 kg] 110.2 kg (01/10 0500)  Weight change:  Filed Weights    09/17/23 1152 09/18/23 0500  Weight: 107.7 kg 110.2 kg    Intake/Output:    Intake/Output Summary (Last 24 hours) at 09/18/2023 1555 Last data filed at 09/18/2023 1500 Gross per 24 hour  Intake 5011.08 ml  Output 1339 ml  Net 3672.08 ml     Physical Exam: General: Critically ill-appearing, laying in the bed  HEENT Moist oral mucous membranes  Pulm/lungs Requiring Reader O2, normal breathing effort  CVS/Heart Tachycardic, irregular  Abdomen:  Soft, distended, nontender  Extremities: Trace to 1+ edema  Neurologic: Able to follow simple commands but confused  Skin: Rashes  Access:        Basic Metabolic Panel:  Recent Labs  Lab 09/16/23 0000 09/17/23 1218 09/17/23 1722 09/17/23 1949 09/18/23 0507  NA 142 141 137 137 135  K 5.0 6.1* 5.2* 5.6* 5.0  CL 108 107 106 104 101  CO2 20 13* 11* 12* 12*  GLUCOSE  --  138* 112* 142* 141*  BUN 121* 155* 143* 150* 129*  CREATININE 5.4* 7.03* 6.89* 7.13* 6.09*  CALCIUM  7.9* 7.3* 6.9* 6.9* 6.8*  MG  --   --   --   --  2.3  PHOS  --   --   --   --  8.2*     CBC: Recent Labs  Lab 09/16/23 0000 09/17/23 1218 09/17/23 1549 09/17/23 1950 09/18/23 0636 09/18/23 1342  WBC 7.5 5.8 8.0 8.2 8.5 8.7  NEUTROABS 6,210.00 4.9 7.1 7.4  --   --   HGB  7.9* 7.9* 6.9* 6.7* 7.6* 7.1*  HCT 25* 25.7* 21.8* 20.7* 23.3* 21.8*  MCV  --  104.0* 102.8* 102.0* 92.5 92.0  PLT 81* 66* 54* 54* 88* 74*      Lab Results  Component Value Date   HEPBSAG NON REACTIVE 09/17/2023   HEPBIGM NON REACTIVE 09/17/2023      Microbiology:  Recent Results (from the past 240 hours)  Blood Culture (routine x 2)     Status: None (Preliminary result)   Collection Time: 09/17/23 12:18 PM   Specimen: BLOOD  Result Value Ref Range Status   Specimen Description BLOOD RIGHT ANTECUBITAL  Final   Special Requests   Final    BOTTLES DRAWN AEROBIC AND ANAEROBIC Blood Culture results may not be optimal due to an inadequate volume of blood received in culture  bottles   Culture   Final    NO GROWTH < 24 HOURS Performed at [facility], [address] [place], [place] [address]    Report Status PENDING  Incomplete  Blood Culture (routine x 2)     Status: None (Preliminary result)   Collection Time: 09/17/23 12:19 PM   Specimen: BLOOD  Result Value Ref Range Status   Specimen Description BLOOD LEFT ANTECUBITAL  Final   Special Requests   Final    BOTTLES DRAWN AEROBIC AND ANAEROBIC Blood Culture adequate volume   Culture   Final    NO GROWTH < 24 HOURS Performed at [facility], [address] [place], [place] [address]    Report Status PENDING  Incomplete  Resp panel by RT-PCR (RSV, Flu A&B, Covid) Urine, Catheterized     Status: None   Collection Time: 09/17/23 12:23 PM   Specimen: Urine, Catheterized; Nasal Swab  Result Value Ref Range Status   SARS Coronavirus 2 by RT PCR NEGATIVE NEGATIVE Final    Comment: (NOTE) SARS-CoV-2 target nucleic acids are NOT DETECTED.  The SARS-CoV-2 RNA is generally detectable in upper respiratory specimens during the acute phase of infection. The lowest concentration of SARS-CoV-2 viral copies this assay can detect is 138 copies/mL. A negative result does not preclude SARS-Cov-2 infection and should not be used as the sole basis for treatment or other patient management decisions. A negative result may occur with  improper specimen collection/handling, submission of specimen other than nasopharyngeal swab, presence of viral mutation(s) within the areas targeted by this assay, and inadequate number of viral copies(<138 copies/mL). A negative result must be combined with clinical observations, patient history, and epidemiological information. The expected result is Negative.  Fact Sheet for Patients:  bloggercourse.com  Fact Sheet for Healthcare Providers:  seriousbroker.it  This test is no t yet approved or cleared by the  [place]  FDA and  has been authorized for detection and/or diagnosis of SARS-CoV-2 by FDA under an Emergency Use Authorization (EUA). This EUA will remain  in effect (meaning this test can be used) for the duration of the COVID-19 declaration under Section 564(b)(1) of the Act, 21 U.S.C.section 360bbb-3(b)(1), unless the authorization is terminated  or revoked sooner.       Influenza A by PCR NEGATIVE NEGATIVE Final   Influenza B by PCR NEGATIVE NEGATIVE Final    Comment: (NOTE) The Xpert Xpress SARS-CoV-2/FLU/RSV plus assay is intended as an aid in the diagnosis of influenza from Nasopharyngeal swab specimens and should not be used as a sole basis for treatment. Nasal washings and aspirates are unacceptable for Xpert Xpress SARS-CoV-2/FLU/RSV testing.  Fact Sheet for  Patients: bloggercourse.com  Fact Sheet for Healthcare Providers: seriousbroker.it  This test is not yet approved or cleared by the [place]  FDA and has been authorized for detection and/or diagnosis of SARS-CoV-2 by FDA under an Emergency Use Authorization (EUA). This EUA will remain in effect (meaning this test can be used) for the duration of the COVID-19 declaration under Section 564(b)(1) of the Act, 21 U.S.C. section 360bbb-3(b)(1), unless the authorization is terminated or revoked.     Resp Syncytial Virus by PCR NEGATIVE NEGATIVE Final    Comment: (NOTE) Fact Sheet for Patients: bloggercourse.com  Fact Sheet for Healthcare Providers: seriousbroker.it  This test is not yet approved or cleared by the [place]  FDA and has been authorized for detection and/or diagnosis of SARS-CoV-2 by FDA under an Emergency Use Authorization (EUA). This EUA will remain in effect (meaning this test can be used) for the duration of the COVID-19 declaration under Section 564(b)(1) of the Act, 21 U.S.C. section  360bbb-3(b)(1), unless the authorization is terminated or revoked.  Performed at [facility], [address] [place], [place] [address]   MRSA Next Gen by PCR, Nasal     Status: None   Collection Time: 09/17/23  5:21 PM   Specimen: Nasal Mucosa; Nasal Swab  Result Value Ref Range Status   MRSA by PCR Next Gen NOT DETECTED NOT DETECTED Final    Comment: (NOTE) The GeneXpert MRSA Assay (FDA approved for NASAL specimens only), is one component of a comprehensive MRSA colonization surveillance program. It is not intended to diagnose MRSA infection nor to guide or monitor treatment for MRSA infections. Test performance is not FDA approved in patients less than 51 years old. Performed at [facility], [address] [place], [place] [address]     Coagulation Studies: Recent Labs    09/17/23 Sep 08, 1217  LABPROT 15.7*  INR 1.2    Urinalysis: Recent Labs    09/17/23 1222  COLORURINE YELLOW*  LABSPEC 1.012  PHURINE 5.0  GLUCOSEU NEGATIVE  HGBUR NEGATIVE  BILIRUBINUR NEGATIVE  KETONESUR NEGATIVE  PROTEINUR 30*  NITRITE NEGATIVE  LEUKOCYTESUR NEGATIVE      Imaging: US  Venous Img Lower Bilateral (DVT) Result Date: 09/17/2023 CLINICAL DATA:  Bilateral leg swelling. EXAM: BILATERAL LOWER EXTREMITY VENOUS DOPPLER ULTRASOUND TECHNIQUE: Gray-scale sonography with graded compression, as well as color Doppler and duplex ultrasound were performed to evaluate the lower extremity deep venous systems from the level of the common femoral vein and including the common femoral, femoral, profunda femoral, popliteal and calf veins including the posterior tibial, peroneal and gastrocnemius veins when visible. The superficial great saphenous vein was also interrogated. Spectral Doppler was utilized to evaluate flow at rest and with distal augmentation maneuvers in the common femoral, femoral and popliteal veins. COMPARISON:  None Available. FINDINGS: RIGHT LOWER EXTREMITY Common  Femoral Vein: No evidence of thrombus. Normal compressibility, respiratory phasicity and response to augmentation. Saphenofemoral Junction: No evidence of thrombus. Normal compressibility and flow on color Doppler imaging. Profunda Femoral Vein: No evidence of thrombus. Normal compressibility and flow on color Doppler imaging. Femoral Vein: No evidence of thrombus. Normal compressibility, respiratory phasicity and response to augmentation. Popliteal Vein: No evidence of thrombus. Normal compressibility, respiratory phasicity and response to augmentation. Calf Veins: No evidence of thrombus. Normal compressibility and flow on color Doppler imaging. Superficial Great Saphenous Vein: No evidence of thrombus. Normal compressibility. Venous Reflux:  None. Other Findings: Complex fluid collection along the right calf measuring 8.8 x 2.9 x 4.3 cm. No  abnormal blood flow on Doppler. Few internal echoes. Lobular margins. LEFT LOWER EXTREMITY Common Femoral Vein: No evidence of thrombus. Normal compressibility, respiratory phasicity and response to augmentation. Saphenofemoral Junction: No evidence of thrombus. Normal compressibility and flow on color Doppler imaging. Profunda Femoral Vein: No evidence of thrombus. Normal compressibility and flow on color Doppler imaging. Femoral Vein: No evidence of thrombus. Normal compressibility, respiratory phasicity and response to augmentation. Popliteal Vein: No evidence of thrombus. Normal compressibility, respiratory phasicity and response to augmentation. Calf Veins: No evidence of thrombus. Normal compressibility and flow on color Doppler imaging. Superficial Great Saphenous Vein: No evidence of thrombus. Normal compressibility. Venous Reflux:  None. Other Findings:  None. IMPRESSION: No evidence of deep venous thrombosis in either lower extremity. 8.8 x 2.9 x 4.3 cm complex fluid collection along the right calf region. This has has a differential including resolving hematoma,  infection or other lesion. Please correlate with specific history and further workup/follow up is recommended. Electronically Signed   By: Ranell Bring M.D.   On: 09/17/2023 18:30   CT ABDOMEN PELVIS WO CONTRAST Result Date: 09/17/2023 CLINICAL DATA:  Abdomen pain EXAM: CT ABDOMEN AND PELVIS WITHOUT CONTRAST TECHNIQUE: Multidetector CT imaging of the abdomen and pelvis was performed following the standard protocol without IV contrast. RADIATION DOSE REDUCTION: This exam was performed according to the departmental dose-optimization program which includes automated exposure control, adjustment of the mA and/or kV according to patient size and/or use of iterative reconstruction technique. COMPARISON:  CT 08/17/2022 FINDINGS: Lower chest: Lung bases demonstrate mild bronchiectasis in the lower lobes. Small foci of lower lobe consolidation and peribronchial thickening. Small focus of right middle lobe consolidation. Coronary vascular calcification. Aortic valve calcifications. Borderline to mild cardiomegaly. Hepatobiliary: No calcified gallstone. No biliary dilatation. Hepatic granuloma. Pancreas: Unremarkable. No pancreatic ductal dilatation or surrounding inflammatory changes. Spleen: Numerous calcified granuloma Adrenals/Urinary Tract: Adrenal glands are normal. Polycystic kidneys bilaterally with numerous simple and complex cysts and subcentimeter hyper and hypodensities too small to further characterize, grossly stable as compared with 2023 and further assessment limited without contrast no hydronephrosis. Decompressed urinary bladder by Foley catheter Stomach/Bowel: Moderate fluid distension of stomach. No evidence for bowel obstruction or bowel wall thickening. Negative appendix. Vascular/Lymphatic: Moderate aortic atherosclerosis. No aneurysm. No suspicious lymph nodes Reproductive: Negative prostate Other: Negative for pelvic effusion or free air. Small fat containing inguinal hernias. Musculoskeletal: No  acute or suspicious osseous abnormality. IMPRESSION: 1. No CT evidence for acute intra-abdominal or pelvic abnormality. 2. Mild lower lobe bronchiectasis. Peribronchial thickening with patchy foci of consolidations in the lower lobes and right middle lobe, possible multifocal pneumonia, imaging follow-up to resolution is recommended. 3. Polycystic kidneys. Numerous simple and complex bilateral renal cysts, grossly unchanged, further evaluation limited without contrast. Non emergent/outpatient renal CT or MRI follow-up contrast examination could be performed if desired. 4. Moderate fluid distension of stomach. 5. Aortic atherosclerosis. Aortic Atherosclerosis (ICD10-I70.0). Electronically Signed   By: Luke Bun M.D.   On: 09/17/2023 16:07   CT HEAD WO CONTRAST ( ) Result Date: 09/17/2023 CLINICAL DATA:  Non focal encephalopathy. Evaluate for intracranial hemorrhage or CVA. Acute onset of confusion and lethargy. EXAM: CT HEAD WITHOUT CONTRAST TECHNIQUE: Contiguous axial images were obtained from the base of the skull through the vertex without intravenous contrast. RADIATION DOSE REDUCTION: This exam was performed according to the departmental dose-optimization program which includes automated exposure control, adjustment of the mA and/or kV according to patient size and/or use of iterative reconstruction technique. COMPARISON:  CT  head without contrast 11/17/2021. FINDINGS: Brain: Moderate atrophy and white matter changes demonstrate some progression since the prior exam. No acute infarct, hemorrhage, or mass lesion is present. The ventricles are of normal size. No significant extraaxial fluid collection is present. The brainstem and cerebellum are within normal limits. A relatively empty sella is present. Midline structures are otherwise within normal limits. Vascular: Atherosclerotic calcifications are present within the cavernous internal carotid arteries bilaterally. No hyperdense vessel is present.  Skull: Calvarium is intact. No focal lytic or blastic lesions are present. No significant extracranial soft tissue lesion is present. Sinuses/Orbits: Bilateral lens replacements are noted. Globes and orbits are otherwise unremarkable. The paranasal sinuses and mastoid air cells are clear. IMPRESSION: 1. No acute intracranial abnormality or significant interval change. 2. Moderate atrophy and white matter disease demonstrates some progression since the prior exam. This likely reflects the sequela of chronic microvascular ischemia. Electronically Signed   By: Lonni Necessary M.D.   On: 09/17/2023 14:51   DG Chest Port 1 View Result Date: 09/17/2023 CLINICAL DATA:  Sepsis EXAM: PORTABLE CHEST 1 VIEW COMPARISON:  X-ray 08/30/2023. FINDINGS: Enlarged cardiopericardial silhouette. Sternal wires. Stable widened mediastinum with a calcified aorta. Persistent left pleural effusion but improving. Adjacent mild opacity. Presumed calcification along the left lung apex. No pneumothorax or edema. Overlapping cardiac leads. IMPRESSION: Postop chest.  Enlarged heart. Improving left pleural effusion. Electronically Signed   By: Ranell Bring M.D.   On: 09/17/2023 12:21     Medications:    sodium chloride      sodium chloride  Stopped (09/18/23 1245)   argatroban  0.35 mcg/kg/min (09/18/23 1500)   norepinephrine  (LEVOPHED ) Adult infusion 11 mcg/min (09/18/23 1500)   piperacillin -tazobactam (ZOSYN )  IV 3.375 g (09/18/23 1546)   prismasol  BGK 2/2.5 dialysis solution 1,500 mL/hr at 09/18/23 0950   prismasol  BGK 2/2.5 replacement solution 400 mL/hr at 09/18/23 0300   prismasol  BGK 2/2.5 replacement solution 400 mL/hr at 09/18/23 0300   vasopressin  0.03 Units/min (09/18/23 1500)    vitamin C   500 mg Oral BID   Chlorhexidine  Gluconate Cloth  6 each Topical Q0600   feeding supplement (NEPRO CARB STEADY)  237 mL Oral TID BM   hydrocortisone  sod succinate (SOLU-CORTEF ) inj  100 mg Intravenous Q8H   insulin  aspart  0-9  Units Subcutaneous Q4H   multivitamin  1 tablet Per Tube QHS   sodium chloride  flush  3 mL Intravenous Q12H   sodium chloride , docusate sodium , heparin , polyethylene glycol, sodium chloride , sodium chloride  flush  Assessment/ Plan:  78 y.o. male with  History of CABG CHF.  Had 1 diastolic dysfunction hypertension, hyperlipidemia, gout, tracheomalacia dependent, osteoarthritis, polycystic kidney disease, history of COVID infection in 2024, brain atrophy and white matter disease suggesting chronic microvascular ischemia, aortic atherosclerosis  admitted on 09/17/2023 for NSTEMI (non-ST elevated myocardial infarction) (HCC) [I21.4] AKI (acute kidney injury) (HCC) [N17.9] Acute urinary retention [R33.8] Renal failure (ARF), acute on chronic (HCC) [N17.9, N18.9] Hypotension, unspecified hypotension type [I95.9]  Acute kidney injury Likely secondary to ATN from hypotension. Patient was started on emergent CRRT early morning of 09/18/2023. Tolerating well so far. 2K bath.  Net ultrafiltration 0.  Severe hyperkalemia Corrected with CRRT. Currently on 2K bath.  Continue to monitor.  Severe Acute metabolic acidosis From lactic acidosis due to hypotension. Currently requiring pressors.  Chronic kidney disease stage IV, due to diabetes, hypertension and polycystic kidney disease Baseline creatinine 2.65/GFR 24 from August 18, 2023    LOS: 1 Aidden Markovic Dennise 1/10/20253:55 PM  [facility]  [address] [place], [place] [phone]  Note: This note was prepared with Dragon dictation. Any transcription errors are unintentional

## 2023-09-18 NOTE — Consult Note (Signed)
 PHARMACY - ANTICOAGULATION CONSULT NOTE  Pharmacy Consult for Argatroban  Indication: chest pain/ACS - Suspected HIT  Allergies  Allergen Reactions   Oxycodone  Anaphylaxis    02/25/2022    Patient Measurements: Height: 5' 10 (177.8 cm) Weight: 110.2 kg (242 lb 15.2 oz) IBW/kg (Calculated) : 73 Heparin  Dosing Weight: 96.2 kg  Vital Signs: Temp: 99 F (37.2 C) (01/10 0630) Temp Source: Rectal (01/10 0600) BP: 84/67 (01/10 0400) Pulse Rate: 47 (01/10 0630)  Labs: Recent Labs    09/17/23 1218 09/17/23 1408 09/17/23 1549 09/17/23 1722 09/17/23 1949 09/17/23 1950 09/18/23 0129 09/18/23 0507 09/18/23 0636  HGB 7.9*  --  6.9*  --   --  6.7*  --   --  7.6*  HCT 25.7*  --  21.8*  --   --  20.7*  --   --  23.3*  PLT 66*  --  54*  --   --  54*  --   --  88*  APTT 28  --   --   --   --  >200* 112*  --   --   LABPROT 15.7*  --   --   --   --   --   --   --   --   INR 1.2  --   --   --   --   --   --   --   --   CREATININE 7.03*  --   --  6.89* 7.13*  --   --  6.09*  --   CKTOTAL  --   --   --  129  --   --   --   --   --   TROPONINIHS 1,451* 1,320*  --   --   --   --   --   --   --    Estimated Creatinine Clearance: 12.2 mL/min (A) (by C-G formula based on SCr of 6.09 mg/dL (H)).  Medical History: Past Medical History:  Diagnosis Date   Arthritis    Chronic kidney disease    Complication of anesthesia    constipation and inability to  urinate happened a few days after cataract surgery    Diabetes (HCC)    type 2    Diverticulitis    GERD (gastroesophageal reflux disease)    Gout    Heart attack (HCC)    Heart disease    Heart murmur    Hyperglycemia    Hyperlipidemia    Hypertension    Osteoporosis    Pneumonia    hx of BOOP followed by Dr Darlean    Sleep apnea    cpapp - setting at 17    Medications:  No anticoagulation per chart review  Assessment: 78 yo male presenting for confusion and lethargy. In Ed, found to have elevated troponin and pharmacy was  consulted to initiate heparin  infusion. On presentation, patient had low platelet count of 81, which trended down this AM to 66. Per ED provider, heparin  bolus and infusion started at 1318 today, as patient had concern for ACS, hgb was low but stable, and no signs of bleeding.  Upon recheck of CBC, patient's platelets are now 54. Pharmacy has been consulted to transition to argatroban  out of concern for possible heparin  induced thrombocytopenia.  Patient does not have a documented history of thrombocytopenia and, of note, patient was hospitalized recently (08/2023) for HCAP and they received heparin  infusion from 12/22 - 12/24 at 1,000 units/hr as well as subcutaneous  heparin  injections (5,000 units q8hours) from 12/24-12/29. During that admission, patient had a stable platelet count in mid to low 200s.   Baseline labs: aPTT 28s hgb 6.9, plt 54, INR 1.2  Goal of Therapy:  aPTT 50-90 seconds Monitor platelets by anticoagulation protocol: Yes   Plan: aPTT therapeutic Continue argatroban  at 0.35 mcg/kg/min  Check aPTT in 12 hours to confirm Then check a minimum of daily  Monitor CBC and H&H daily   Thank you for involving pharmacy in this patient's care.   Adriana Bolster, PharmD, BCPS 09/18/2023 6:59 AM

## 2023-09-18 NOTE — Progress Notes (Signed)
*  PRELIMINARY RESULTS* Echocardiogram A Limited 2D Echocardiogram has been performed.  Carolyne Fiscal 09/18/2023, 4:09 PM

## 2023-09-18 NOTE — Progress Notes (Signed)
 OT Cancellation Note  Patient Details Name: Kurt Aguilar MRN: [id number] DOB: May 13, 1946   Cancelled Treatment:    Reason Eval/Treat Not Completed: Patient not medically ready. Consult received, chart reviewed. Secure chat with RN, requesting holding at this time. Pt requiring increasing pressor support, confused, and on CRRT. Will re-attempt at later date/time as medically appropriate.   Jaz Laningham R., MPH, MS, OTR/L ascom [phone] 09/18/23, 12:46 PM

## 2023-09-18 NOTE — Progress Notes (Signed)
 NAME:  Kurt Aguilar, MRN:  [id number], DOB:  [birth year], LOS: 1 ADMISSION DATE:  09/17/2023, CONSULTATION DATE: 09/17/2023 REFERRING MD: Dr. Claudene, CHIEF COMPLAINT: AMS   History of Present Illness:  This is a 78 yo male with a PMH CAD s/p CABG in 1997, moderate mitral valve stenosis, severe tracheobronchomalacia, CKD stage IV, type 2 diabetes mellitus, HTN, HLD, and OSA on CPAP.  He presented to [facility] ER on 01/09 via EMS from [facility] with altered mental status.  Pts wife at bedside reports the pt has had poor po intake over the past 2 days and an inability to urinate.  He also has had back spasms along with twitching movements.  Per outpatient facility documentation pts recent blood work results were abnormal due to creatinine increasing from 3.35 to 5.35.  He has had 2 hospitalizations in December 2024 with acute hypoxic respiratory failure secondary to pneumonia with MRSE, rhinovirus, elevated troponin's, acute kidney injury superimposed on CKD stage IV, and tracheobronchomalacia.  ED Course Upon arrival to the ER pt hypotensive.  Significant lab results were: K+ 6.1/CO2 13/glucose 138/BUN 155/creatinine 7.03/calcium  7.3/anion gap 21/albumin  3.1/AST 66/ALT 61/BNP 320.1/troponin 1,451/lactic acid 6.3/pct 0.64/hgb 7.9/platelet count 66.  EKG revealed sinus rhythm with RBBB which is not a new finding, but no ST elevation.  CXR and Resp panel by RT-PCR negative.  CT Abd Pelvis negative for acute intra-abdominal or pelvic abnormality, although concerning for multifocal pneumonia.  Pt received 100 mg of iv solucortef, 10 units of iv insulin , 1 amp of D50W, 10 g of lokelma , 2L LR bolus, and heparin  bolus followed by heparin  gtt.   Despite aggressive iv fluid resuscitation bp readings remained soft and PCCM team contacted for ICU admission.   CT Abd Pelvis WO Contrast: No CT evidence for acute intra-abdominal or pelvic abnormality. Mild lower lobe bronchiectasis. Peribronchial thickening with patchy foci  of consolidations in the lower lobes and right middle lobe, possible multifocal pneumonia, imaging follow-up to resolution is recommended. Polycystic kidneys. Numerous simple and complex bilateral renal cysts, grossly unchanged, further evaluation limited without contrast. Non emergent/outpatient renal CT or MRI follow-up contrast examination could be performed if desired. Moderate fluid distension of stomach. Aortic atherosclerosis.   CT Head: No acute intracranial abnormality or significant interval change. Moderate atrophy and white matter disease demonstrates some progression since the prior exam. This likely reflects the sequela of chronic microvascular ischemia.   Pertinent  Medical History  CAD s/p CABG in 1997 Moderate mitral valve stenosis Severe tracheobronchomalacia CKD stage IV Type II diabetes mellitus HTN HLD OSA on CPAP  Micro Data:   Resp panel by RT-PCR 01/9>>negative  Blood x2 01/9>> no growth to date Acute Hepatitis panel 1/9: non-reactive Aspergillus Ag. BAL/Serum 01/9>> Fungitell Beta-D-Glucan 1/9:  Urine 01/9>> MRSA PCR 01/9>>Negative  Antimicrobials:   Anti-infectives (From admission, onward)    Start     Dose/Rate Route Frequency Ordered Stop   09/18/23 0900  piperacillin -tazobactam (ZOSYN ) IVPB 3.375 g        3.375 g 100 mL/hr over 30 Minutes Intravenous Every 6 hours 09/18/23 0728     09/17/23 1900  piperacillin -tazobactam (ZOSYN ) IVPB 2.25 g  Status:  Discontinued        2.25 g 100 mL/hr over 30 Minutes Intravenous Every 6 hours 09/17/23 1809 09/18/23 0728       Significant Hospital Events: Including procedures, antibiotic start and stop dates in addition to other pertinent events   01/9: Admitted to ICU with hypotension multifactorial due to  possible adrenal insufficiency/septic shock, and hypovolemia, acute kidney injury superimposed on CKD stage IV with hyperkalemia, lactic and metabolic acidosis, elevated troponin secondary to demand ischemia  vs. NSTEMI, macrocytic anemia, and thrombocytopenia     01/10:  Started on CRRT overnight due to worsening AKI and metabolic acidosis.  Requiring Levophed  with escalating requirements, add Vasopressin . Remains Encephalopathic.  Transfused 1 unit platelets for HD catheter insertion, given 1 unit pRBCs for Hgb of 6.7 with appropriate response in Hgb to 7.6.  Cardiology plans to repeat Echo. Consult Palliative Care for GOC discussions.  Interim History / Subjective:  -Overnight was started on CRRT due to worsening AKI and metabolic acidosis -Was given 1 unit of platelets for insertion of HD catheter, along with 1 unit of pRBCs for Hgb of 6.7 which improve to 7.6 post transfusion -Requiring Levophed , currently at 13 mcg  Objective   Blood pressure (!) 84/67, pulse (!) 44, temperature 99.1 F (37.3 C), resp. rate 17, height 5' 10 (1.778 m), weight 110.2 kg, SpO2 100%.        Intake/Output Summary (Last 24 hours) at 09/18/2023 0936 Last data filed at 09/18/2023 0900 Gross per 24 hour  Intake 4666.7 ml  Output 1232 ml  Net 3434.7 ml   Filed Weights   09/17/23 1152 09/18/23 0500  Weight: 107.7 kg 110.2 kg    Examination: General: Acute on chronically-ill appearing male, NAD on 2L O2 via nasal canula  HENT: Supple, no JVD  Lungs: Clear throughout, even, non labored  Cardiovascular: Irregularly irregular rhythm, no m/r/g, trace bilateral upper extremity edema   Abdomen: +BS x4, obese, non distended, non tender  Extremities: Moves all extremities  Neuro: Awake and alert,oriented only to self, able to follow commands, no focal deficits noted,, PERRLA  Skin: Bilateral upper extremity ecchymosis, multiple bilateral upper extremity skin tears, blanchable sacral spine erythema, RLQ ecchymosis, right calf ecchymosis present on admission  GU: Indwelling foley catheter draining hazy yellow urine with sediment   Resolved Hospital Problem list    Assessment & Plan:   #Acute toxic metabolic  encephalopathy CT Head on presentation negative for acute intracranial abnormality -Treatment of metabolic derangements as outlined below -Provide supportive care -Promote normal sleep/wake cycle and family presence -Avoid sedating medications as able  #Tracheobronchomalacia  Hx: OSA and pneumonia with MRSE  - Supplemental O2 for dyspnea and/or hypoxia  - Maintain O2 sats 92% or higher  - CPAP at bedtime   #Hypotension multifactorial: hypovolemia, possible adrenal insufficiency and/or sepsis  #Elevated troponin secondary to demand ischemia vs. NSTEMI  #HFpEF  Hx: CAD s/p CABG in 1997, HTN, HLD, moderate mitral valve stenosis, and heart murmur  Echo 08/31/23: EF 55 to 60%, mild mitral valve stenosis, mild to moderate aortic valve stenosis  -Continuous cardiac monitoring -Maintain MAP >65 -Vasopressors as needed to maintain MAP goal ~ will add Vasopressin  11/10 -Trend lactic acid until normalized -HS Troponin peaked at 1451 -Volume removal with CRRT -Continue Stress dose steroids -Cardiology following, appreciate input ~ plan for repeat Echocardiogram  - TSH normal at 0.989 -Continue Argatroban  for [facility] given concern for HIT  #Acute kidney injury superimposed on CKD stage IV secondary to obstructive uropathy due to urinary retention and ATN  #Hyperkalemia ~ RESOLVED #Lactic and metabolic acidosis  #Polycystic kidney per CT Abd Pelvis  -Monitor I&O's / urinary output -Follow BMP -Ensure adequate renal perfusion -Avoid nephrotoxic agents as able -Replace electrolytes as indicated ~ Pharmacy following for assistance with electrolyte replacement -Bicarb gtt ~ will stop now  that CRRT initiated -Nephrology following, appreciate input ~ Continue CRRT as per Nephrology  #Transaminitis suspect secondary to shock liver  -CT Abdomen & Pelvis 1/9 without any acute intra-abdominal or pelvic abnormality -Acute Hepatitis panel non-reactive - Trend hepatic function panel  - Avoid hepatoxic  medications   #Macrocytic anemia  #Thrombocytopenia ~ ? Due to recent use of Linezolid  vs. HIT vs marrow suppression due to shock/infection Smear review results 10/07/23 normal platelet morphology   -Monitor for S/Sx of bleeding -Trend CBC -Argatroban  for AC/VTE Prophylaxis  -Transfuse for Hgb <7 -Transfuse platelets for Platelets count <10 K , <50 K with active bleeding, or <100 K with Neurosurgical procedures -Oncology is following, appreciate input -HIT panel is pending  #Questionable Multifocal Pneumonia -Monitor fever curve -Trend WBC's & Procalcitonin -Follow cultures as above -Continue empiric Zosyn  pending cultures & sensitivities  #Blanchable sacral spine erythema  - Turn q2hrs   #GERD - IV pepcid    #Type II diabetes mellitus  - Hemoglobin A1c pending  - CBG's q4hrs - SSI    Pt is critically ill with shock and multiorgan failure.  Prognosis is extremely guarded, high risk for further decompensation, cardiac arrest and death.  Given current critical illness, multiple chronic co morbidities, and advanced age, overall long term prognosis is poor.  Pt is DNR/DNI, will consult Palliative Care to assist with ongoing goals of care discussions.   Best Practice (right click and Reselect all SmartList Selections daily)   Diet/type: NPO pending bedside swallow evaluation  DVT prophylaxis: Argatroban  Pressure ulcer(s): Blanchable sacral spine erythema present on admission  GI prophylaxis: H2B Lines: Trialysis and Arterial line, both still needed Foley:  Yes, and it is still needed Code Status:  DNR Last date of multidisciplinary goals of care discussion [1/10]  01/10: Pt's spouse and daughter updated at bedside on plan of care  Labs   CBC: Recent Labs  Lab 09/16/23 0000 09/17/23 1218 09/17/23 1549 09/17/23 1950 09/18/23 0636  WBC 7.5 5.8 8.0 8.2 8.5  NEUTROABS 6,210.00 4.9 7.1 7.4  --   HGB 7.9* 7.9* 6.9* 6.7* 7.6*  HCT 25* 25.7* 21.8* 20.7* 23.3*  MCV  --   104.0* 102.8* 102.0* 92.5  PLT 81* 66* 54* 54* 88*    Basic Metabolic Panel: Recent Labs  Lab 09/16/23 0000 09/17/23 1218 09/17/23 1722 09/17/23 1949 09/18/23 0507  NA 142 141 137 137 135  K 5.0 6.1* 5.2* 5.6* 5.0  CL 108 107 106 104 101  CO2 20 13* 11* 12* 12*  GLUCOSE  --  138* 112* 142* 141*  BUN 121* 155* 143* 150* 129*  CREATININE 5.4* 7.03* 6.89* 7.13* 6.09*  CALCIUM  7.9* 7.3* 6.9* 6.9* 6.8*  MG  --   --   --   --  2.3  PHOS  --   --   --   --  8.2*   GFR: Estimated Creatinine Clearance: 12.2 mL/min (A) (by C-G formula based on SCr of 6.09 mg/dL (H)). Recent Labs  Lab 09/17/23 1218 09/17/23 1408 09/17/23 1549 09/17/23 1950 09/18/23 0140 09/18/23 0505 09/18/23 0636  PROCALCITON 0.64  --   --   --   --   --   --   WBC 5.8  --  8.0 8.2  --   --  8.5  LATICACIDVEN 6.3* 4.7*  --  4.7* 6.4* 6.9*  --     Liver Function Tests: Recent Labs  Lab 09/16/23 0000 09/17/23 1218 09/17/23 1950 09/18/23 0505  AST 51* 66* 60*  --  ALT 58* 61* 54*  --   ALKPHOS 75 65 59  --   BILITOT  --  0.9 1.2  --   PROT  --  5.5* 4.9*  --   ALBUMIN  3.6 3.1* 2.7* 2.5*   No results for input(s): LIPASE, AMYLASE in the last 168 hours. No results for input(s): AMMONIA in the last 168 hours.  ABG    Component Value Date/Time   HCO3 14.7 (L) 09/17/2023 1933   TCO2 20 (L) 09/17/2021 1856   ACIDBASEDEF 11.9 (H) 09/17/2023 1933   O2SAT 28 09/17/2023 1933     Coagulation Profile: Recent Labs  Lab 09/17/23 1218  INR 1.2    Cardiac Enzymes: Recent Labs  Lab 09/17/23 1722  CKTOTAL 129    HbA1C: Hemoglobin A1C  Date/Time Value Ref Range Status  09/16/2023 12:00 AM 8.2  Final  05/08/2023 11:11 AM 6.3 (A) 4.0 - 5.6 % Final  09/05/2022 01:26 PM 7.9 (A) 4.0 - 5.6 % Final   Hgb A1c MFr Bld  Date/Time Value Ref Range Status  05/17/2020 02:44 PM 6.6 (H) 4.8 - 5.6 % Final    Comment:    (NOTE) Pre diabetes:          5.7%-6.4%  Diabetes:               >6.4%  Glycemic control for   <7.0% adults with diabetes   03/24/2019 09:25 AM 6.3 4.6 - 6.5 % Final    Comment:    Glycemic Control Guidelines for People with Diabetes:Non Diabetic:  <6%Goal of Therapy: <7%Additional Action Suggested:  >8%     CBG: Recent Labs  Lab 09/17/23 1530 09/17/23 1945 09/17/23 2339 09/18/23 0358 09/18/23 0745  GLUCAP 108* 133* 131* 111* 129*    Review of Systems: Positives in BOLD   Unable to assess due to AMS  Past Medical History:  He,  has a past medical history of Aortic stenosis, Arthritis, Asthma, CAD (coronary artery disease), CKD (chronic kidney disease), stage IV (HCC), Complication of anesthesia, Diverticulitis, GERD (gastroesophageal reflux disease), Gout, Heart attack (HCC), Heart murmur, Hyperlipidemia, Hypertension, Mild mitral stenosis, Osteoporosis, Pneumonia, PSVT (paroxysmal supraventricular tachycardia) (HCC), Sleep apnea, Tracheobronchomalacia, and Type II diabetes mellitus (HCC).   Surgical History:   Past Surgical History:  Procedure Laterality Date   ACROMIO-CLAVICULAR JOINT REPAIR Left 05/05/2019   Procedure: SHOULDER ARTHROSCOPIC ASSISTED ACROMIO-CLAVICULAR JOINT REPAIR;  Surgeon: Dozier Soulier, MD;  Location: [facility];  Service: Orthopedics;  Laterality: Left;   CATARACT EXTRACTION Bilateral    CORONARY ARTERY BYPASS GRAFT     [place]    LEFT HEART CATH AND CORS/GRAFTS ANGIOGRAPHY N/A 09/19/2021   Procedure: LEFT HEART CATH AND CORS/GRAFTS ANGIOGRAPHY;  Surgeon: Claudene Victory ORN, MD;  Location: [facility];  Service: Cardiovascular;  Laterality: N/A;   open heart surgery  09/09/1995-1998   5 bypass   REPAIR KNEE LIGAMENT     right   SHOULDER ARTHROSCOPY Left 05/05/2019   Procedure: ARTHROSCOPY SHOULDER;  Surgeon: Dozier Soulier, MD;  Location: [facility];  Service: Orthopedics;  Laterality: Left;   TONSILLECTOMY     TOTAL KNEE ARTHROPLASTY Left 05/29/2020   Procedure: LEFT TOTAL KNEE ARTHROPLASTY;  Surgeon:  Sheril Coy, MD;  Location: [facility];  Service: Orthopedics;  Laterality: Left;     Social History:   reports that he quit smoking about 18 years ago. His smoking use included cigars and cigarettes. He started smoking about 21 years ago. He has a 3 pack-year smoking history. He  has never used smokeless tobacco. He reports current alcohol  use of about 1.0 standard drink of alcohol  per week. He reports that he does not use drugs.   Family History:  His family history includes Heart attack (age of onset: 42) in his son; Heart disease in his father and mother; Stroke in his father. There is no history of Kidney disease, Prostate cancer, or Diabetes.   Allergies Allergies  Allergen Reactions   Oxycodone  Anaphylaxis    02/25/2022      Home Medications  Prior to Admission medications   Medication Sig Start Date End Date Taking? Authorizing Provider  acetaminophen  (TYLENOL ) 500 MG tablet Take 1,000 mg by mouth every 8 (eight) hours as needed.    [provider]  albuterol  (PROVENTIL ) (2.5 MG/3ML) 0.083% nebulizer solution Take 3 mLs (2.5 mg total) by nebulization 2 (two) times daily. Patient not taking: Reported on 09/17/2023 08/28/23   Darci Pore, MD  allopurinol  (ZYLOPRIM ) 100 MG tablet Take 50 mg by mouth daily. Every 2 days. Patient not taking: Reported on 09/17/2023    [provider]  aspirin  EC (ASPIRIN  81) 81 MG tablet Take 81 mg by mouth daily. Swallow whole.    [provider]  bisoprolol  (ZEBETA ) 5 MG tablet Take 0.5 tablets (2.5 mg total) by mouth daily. 08/29/23   Darci Pore, MD  Calcium  Carbonate-Vit D-Min (CALCIUM -VITAMIN D -MINERALS) 600-800 MG-UNIT CHEW Chew 1 tablet by mouth daily.    [provider]  Continuous Blood Gluc Receiver (FREESTYLE LIBRE 2 READER) DEVI USE AS INSTRUCTED TO CHECK BLOOD SUGARS. 01/13/22   Kassie Mallick, MD  Continuous Blood Gluc Sensor (FREESTYLE LIBRE 2 SENSOR) MISC 1 Device by Does not apply route  every 14 (fourteen) days. 10/31/22   Trixie File, MD  cyanocobalamin  (VITAMIN B12) 500 MCG tablet Take 500 mcg by mouth daily.    [provider]  ezetimibe  (ZETIA ) 10 MG tablet TAKE 1 TABLET BY MOUTH EVERY DAY 07/08/23   Tower, [place] A, MD  famotidine  (PEPCID ) 20 MG tablet TAKE 1 TABLET BY MOUTH EVERYDAY AT BEDTIME 04/10/23   Darlean Ozell NOVAK, MD  gabapentin  (NEURONTIN ) 100 MG capsule Take 3 capsules (300 mg total) by mouth at bedtime. Reduced from 300 mg. Patient taking differently: Take 300 mg by mouth at bedtime. Give one capsule by mouth once daily. 09/14/23   Abdul Fine, MD  Glucosamine HCl (GLUCOSAMINE PO) Take 1,500 mg by mouth 2 (two) times daily.     [provider]  glucose blood (FREESTYLE LITE) test strip 1 each by Other route 4 (four) times daily. E11.65 04/10/20   Kassie Mallick, MD  HYDROcodone -acetaminophen  (NORCO/VICODIN) 5-325 MG tablet Take 1 tablet by mouth every 6 (six) hours as needed for moderate pain (pain score 4-6) or severe pain (pain score 7-10). 09/10/23   Caro Harlene POUR, NP  insulin  isophane & regular human KwikPen (NOVOLIN  70/30 KWIKPEN) (70-30) 100 UNIT/ML KwikPen Inject 30 Units into the skin daily with breakfast. And pen needles 1/day 05/08/23   Trixie File, MD  insulin  lispro (HUMALOG  KWIKPEN) 200 UNIT/ML KwikPen Inject 10 Units into the skin 2 (two) times daily before a meal. Inject before lunch and dinner. 09/06/23   Awanda [place], MD  Insulin  Pen Needle (BD PEN NEEDLE NANO 2ND GEN) 32G X 4 MM MISC USE 3 TIMES A DAY 01/25/23   Trixie File, MD  ketoconazole (NIZORAL) 2 % cream Apply 1 Application topically 2 (two) times daily. 11/10/22   [provider]  Lancets (FREESTYLE) lancets  1 EACH BY OTHER ROUTE 4 (FOUR) TIMES DAILY. E11.65 08/23/20   Kassie Mallick, MD  linezolid  (ZYVOX ) 600 MG tablet Take 600 mg by mouth 2 (two) times daily.    [provider]  Menthol, Topical Analgesic, (BIOFREEZE COOL THE PAIN) 4 % GEL Apply  to lower back topically four times daily    [provider]  Multiple Vitamins-Minerals (PRESERVISION AREDS 2 PO) Take 1 capsule by mouth daily.    [provider]  nitroGLYCERIN  (NITROSTAT ) 0.4 MG SL tablet Place 1 tablet (0.4 mg total) under the tongue every 5 (five) minutes x 3 doses as needed for chest pain. 01/19/23   Gollan, Timothy J, MD  omeprazole  (PRILOSEC) 40 MG capsule TAKE 1 CAPUSLE BY MOUTH 30- 60 MIN BEFORE YOUR FIRST AND LAST MEALS OF THE DAY 10/31/22   Darlean Ozell NOVAK, MD  ondansetron  (ZOFRAN ) 8 MG tablet Take 1 tablet (8 mg total) by mouth every 8 (eight) hours as needed for nausea or vomiting. From narcotic pain medicine 01/21/23   Tower, [place] A, MD  OXYGEN 2lpm every shift related to acute respiratory failure with hypoxia.    [provider]  polyethylene glycol (MIRALAX  / GLYCOLAX ) 17 g packet Take 17 g by mouth daily.    [provider]  predniSONE  (DELTASONE ) 20 MG tablet Starting on 09/07/23, take 30 mg prednisone  daily for 7 days, and decrease by 10mg  every 7 days until 10mg  and maintain until seen as outpatient by pulmonologist. Patient taking differently: Take 10 mg by mouth daily. Give 20mg  by mouth once daily for 6 days, then reduce to 10mg  daily. 09/06/23   Awanda [place], MD  rosuvastatin  (CRESTOR ) 40 MG tablet TAKE 1 TABLET BY MOUTH EVERY DAY 11/18/22   Tower, Laine LABOR, MD  Semaglutide , 2 MG/DOSE, (OZEMPIC , 2 MG/DOSE,) 8 MG/3ML SOPN Inject 2 mg into the skin once a week. 05/08/23   Trixie File, MD  senna-docusate (SENOKOT-S) 8.6-50 MG tablet Take 1 tablet by mouth at bedtime. 08/28/23   Darci Pore, MD     Critical care time: 40 minutes     Inge Lecher, AGACNP-BC [facility] Prefer epic messenger for cross cover needs If after hours, please call E-link

## 2023-09-18 NOTE — Consult Note (Signed)
 Cardiology Consult    Patient ID: Kurt Aguilar MRN: [id number], DOB/AGE: 1946/05/20   Admit date: 09/17/2023 Date of Consult: 09/18/2023  Primary Physician: Tower, Laine LABOR, MD Primary Cardiologist: Evalene Lunger, MD Requesting Provider: LOIS November, MD  Patient Profile    Kurt Aguilar is a 78 y.o. male with a history of CAD s/p CABG x 5 in 1997, HTN, HL, DMII, mild mitral stenosis, mild-mod AS, CKD IV, OSA, tracheobronchomalacia, and recent bronchopneumonia, who is being seen today for the evaluation of elevated troponin in the setting of ongoing resp failure, worsening renal failure, hyperkalemia, anemia, acidosis, thrombocytopenia, hypotension, and AMS  at the request of Dr. November.  Past Medical History  Subjective  Past Medical History:  Diagnosis Date   Aortic stenosis    a. 08/2023 Echo: EF 55-60%, no rwma, mild LVH, nl RV fxn, mod dil LA, mild MS, mild-mod AS.   Arthritis    Asthma    CAD (coronary artery disease)    a. 1997 CABG x 5 [place], [place]): LIMA->LAD, VG->D1, VG->OM1->OM2, VG->dRCA; b. 09/2021 NSTEMI/Cath: LM 99ost, LAD diff dzs, D1 100, D2/3 sev dzs, LCX 100ost/p, RCA 100p, VG->dRCA 70ost/p, VG->OM1->OM2 ok, LIMA->LAD ok, VG->D1 ok-->Med rx.   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 01/2023 Zio: predominantly sinus rhythm @ 100 (58-176).  2 SVT runs, fastest 176, longest 16 beats. Rare PACs/PVCs. No triggered events.   Sleep apnea    cpapp - setting at 17    Tracheobronchomalacia    Type II diabetes mellitus (HCC)    type 2     Past Surgical  History:  Procedure Laterality Date   ACROMIO-CLAVICULAR JOINT REPAIR Left 05/05/2019   Procedure: SHOULDER ARTHROSCOPIC ASSISTED ACROMIO-CLAVICULAR JOINT REPAIR;  Surgeon: Dozier Soulier, MD;  Location: [facility];  Service: Orthopedics;  Laterality: Left;   CATARACT EXTRACTION Bilateral    CORONARY ARTERY BYPASS GRAFT     [place]    LEFT HEART CATH AND CORS/GRAFTS ANGIOGRAPHY N/A 09/19/2021   Procedure: LEFT HEART CATH AND CORS/GRAFTS ANGIOGRAPHY;  Surgeon: Claudene Victory ORN, MD;  Location: [facility];  Service: Cardiovascular;  Laterality: N/A;   open heart surgery  09/09/1995-1998   5 bypass   REPAIR KNEE LIGAMENT     right   SHOULDER ARTHROSCOPY Left 05/05/2019   Procedure: ARTHROSCOPY SHOULDER;  Surgeon: Dozier Soulier, MD;  Location: [facility];  Service: Orthopedics;  Laterality: Left;   TONSILLECTOMY     TOTAL KNEE ARTHROPLASTY Left 05/29/2020   Procedure: LEFT TOTAL KNEE ARTHROPLASTY;  Surgeon: Sheril Coy, MD;  Location: [facility];  Service: Orthopedics;  Laterality: Left;     Allergies  Allergies  Allergen Reactions   Oxycodone  Anaphylaxis    02/25/2022       History of Present Illness    78 y.o. male with a history of CAD s/p CABG x 5 in 1997, HTN, HL, DMII, mild mitral stenosis, mild-mod AS, CKD IV, OSA, tracheobronchomalacia, and recent bronchopneumonia.  IN 09/2021, he was admitted w/ NSTEMI.  Cath revealed severe, native multivessel dzs w/ 5/5 patent grafts.  The VG  dRCA had a 70% ost/prox stenosis, which was not felt to be significant, and he was medically managed.  In 01/2023, he wore a zio monitor 2/2 presyncope/syncope.  This showed brief runs of SVT w/o triggered events, and no events to explain presyncope/syncope.  Kurt Aguilar was admitted to [facility] 12/12 .arrwo 08/28/2023 due to hypoxic resp failure in the setting of tracheobronchomalacia and LLL PNA.  Course was complicated by AKI/CIN.  Unfortunately, he required readmission 12/22  09/06/2023 due to  ongoing resp failure, rhinovirus PNA w/ superimposed possible bacterial PNA w/ MRSE.  HsTrop rose to 341.  Echo showed nl EF w/ mild MR, and mild-mod AS.  He was conservatively managed for demand ischemia and was d/c'd to SNF on linezolid .  Per pts family, he never really recovered since being @ SNF.  He suffered fall over a week ago (tried to get up to go the bathroom on his own), and since, PT has had difficulty getting him up out of bed.  He has also had poor appetite and his wife indicates that for 2 days prior to this admission, he was not eating or drinking at all.  Notes indicated that in the setting of poor PO intake, lethargy, and confusion, he had lab work on 1/8 that showed stable anemia (H/H 7.90/25) w/ newly discovered thrombocytopenia (81), and worsening renal fxn w/ BUN/Creat 121/5.4.  He was seen by SNF house staff on 1/9, and was found to be hypotensive (81/56), tachycardic, afebrile, and hypoxic.  He was then transported to the ED.  On arrival, he was afebrile and hypotensive (82/62).  ECG showed RSR @ 99, inferior infarct, RBBB.  Labs notable for worsening renal fxn w/ BUN/Creat of 155/7.03.  K 6.1, lactic acid 6.3, Ca 7.3, anion gap 21, ast/alt 66/61, BNP 320.1, hsTrop 1451  1320, H/H 7.9/25.7  6.9/21.8  6.7/20.7.  Plts 66  54  54 (were 225 on 12/28).  Resp panel neg.  Hepatitis testing non-reactive.  UA hazy w/ rare bacteria.  CXR w/ cardiomegaly and improving L pl effusion.  CT head w/o acute intracranial abnormality.  CT abd/pelvis w/o acute abnormality.  Mild lower lobe bronchiectasis and peribronchial thickening w/ patchy foci of consolidations in lower lobes and RML w/ question of multifocal PNA noted.  Polycystic kidneys, Ao atherosclerosis, and mod fluid distension of stomach also noted.  LE u/s neg for DVT, though 8.8 x 2.9 x 4.3 cm complex fluid collection along the R calf was noted (pts wife says that he had a charlie horse w/ R calf pain during his last admission followed by  spontaneous bruising).  He was initially placed on heparin  but this was converted to argatroban  in the setting of thrombocytopenia.  He was also treated w/ IV steroids, 2L LR boluses, levo, and zosyn .  Pt currently unable to provide much information related to his current hospitalization.  He says that he doesn't feel well but isn't able to provide further details - speech somewhat garbled.  Family at bedside.   Inpatient Medications  Subjective    Chlorhexidine  Gluconate Cloth  6 each Topical Q0600   hydrocortisone  sod succinate (SOLU-CORTEF ) inj  100 mg Intravenous Q8H   insulin  aspart  0-9 Units Subcutaneous Q4H   sodium chloride  flush  3 mL Intravenous Q12H    Family History    Family History  Problem Relation Age of Onset  Heart disease Mother    Heart disease Father    Stroke Father    Heart attack Son 5   Kidney disease Neg Hx    Prostate cancer Neg Hx    Diabetes Neg Hx    He indicated that his mother is deceased. He indicated that his father is deceased. He indicated that his son is deceased. He indicated that the status of his neg hx is unknown.   Social History    Social History   Socioeconomic History   Marital status: Married    Spouse name: Not on file   Number of children: 4   Years of education: 14   Highest education level: Not on file  Occupational History   Occupation: Engineer, [employer]: RETIRED  Tobacco Use   Smoking status: Former    Current packs/day: 0.00    Average packs/day: 1 pack/day for 3.0 years (3.0 ttl pk-yrs)    Types: Cigars, Cigarettes    Start date: 11/25/2001    Quit date: 11/25/2004    Years since quitting: 18.8   Smokeless tobacco: Never   Tobacco comments:    occas. cigar quit 2006  Vaping Use   Vaping status: Never Used  Substance and Sexual Activity   Alcohol  use: Yes    Alcohol /week: 1.0 standard drink of alcohol     Types: 1 Standard drinks or equivalent per week    Comment: rare   Drug use: No   Sexual  activity: Never  Other Topics Concern   Not on file  Social History Narrative   Not on file   Social Drivers of Health   Financial Resource Strain: Low Risk  (04/30/2023)   Overall Financial Resource Strain (CARDIA)    Difficulty of Paying Living Expenses: Not hard at all  Food Insecurity: No Food Insecurity (09/01/2023)   Hunger Vital Sign    Worried About Running Out of Food in the Last Year: Never true    Ran Out of Food in the Last Year: Never true  Transportation Needs: No Transportation Needs (09/01/2023)   PRAPARE - Administrator, Civil Service (Medical): No    Lack of Transportation (Non-Medical): No  Physical Activity: Insufficiently Active (04/30/2023)   Exercise Vital Sign    Days of Exercise per Week: 2 days    Minutes of Exercise per Session: 30 min  Stress: No Stress Concern Present (04/30/2023)   [employer] of Occupational Health - Occupational Stress Questionnaire    Feeling of Stress : Not at all  Social Connections: Socially Integrated (04/30/2023)   Social Connection and Isolation Panel [NHANES]    Frequency of Communication with Friends and Family: More than three times a week    Frequency of Social Gatherings with Friends and Family: More than three times a week    Attends Religious Services: More than 4 times per year    Active Member of [employer] or Organizations: Yes    Attends Banker Meetings: More than 4 times per year    Marital Status: Married  Catering Manager Violence: Not At Risk (09/01/2023)   Humiliation, Afraid, Rape, and Kick questionnaire    Fear of Current or Ex-Partner: No    Emotionally Abused: No    Physically Abused: No    Sexually Abused: No     Review of Systems    General:  +++ lethargy, malaise, confusion.  No chills, fever, night sweats or weight changes.  Cardiovascular:  No chest pain, +++  dyspnea on exertion, +++ LE edema, no orthopnea, palpitations, paroxysmal nocturnal dyspnea. Dermatological: No  rash, lesions/masses Respiratory: No cough, +++ dyspnea Urologic: No hematuria, dysuria Abdominal:   +++ Anorexia.  No nausea, vomiting, diarrhea, bright red blood per rectum, melena, or hematemesis Neurologic:  No visual changes, +++ wkns, +++ changes in mental status. All other systems reviewed and are otherwise negative except as noted above.    Objective  Physical Exam    Blood pressure (!) 84/67, pulse (!) 43, temperature 99.5 F (37.5 C), resp. rate 20, height 5' 10 (1.778 m), weight 110.2 kg, SpO2 100%.  General: Pleasant, NAD Psych: Normal affect. Neuro: Speech garbled.  Unable to ascertain orientation. Moves all extremities spontaneously. HEENT: Normal  Neck: Supple, obese, difficult to gauge JVP.  No bruits.   Lungs:  Resp regular and unlabored, diminished breath sounds bilat. Heart: Irreg, distant, 2/6 syst murmur throughout.  No s3, s4. Abdomen: Obese, soft, non-tender, non-distended, BS + x 4.  Extremities: No clubbing, cyanosis.  Trace to 1+ bilat upper and lower ext edema.  Significant bruising to bilat upper extremities w/ skin tears and oozing from R upper arm.  DP/PT1+, Radials 2+ and equal bilaterally.  Labs    Cardiac Enzymes Recent Labs  Lab 08/30/23 1328 08/30/23 2049 08/31/23 0142 09/17/23 1218 09/17/23 1408  TROPONINIHS 341* 275* 232* 1,451* 1,320*     BNP    Component Value Date/Time   BNP 320.1 (H) 09/17/2023 1218    ProBNP    Component Value Date/Time   PROBNP 24.0 11/11/2016 1048    Lab Results  Component Value Date   WBC 8.5 09/18/2023   HGB 7.6 (L) 09/18/2023   HCT 23.3 (L) 09/18/2023   MCV 92.5 09/18/2023   PLT 88 (L) 09/18/2023    Recent Labs  Lab 09/17/23 1950 09/18/23 0507  NA  --  135  K  --  5.0  CL  --  101  CO2  --  12*  BUN  --  129*  CREATININE  --  6.09*  CALCIUM   --  6.8*  PROT 4.9*  --   BILITOT 1.2  --   ALKPHOS 59  --   ALT 54*  --   AST 60*  --   GLUCOSE  --  141*   Lab Results  Component Value  Date   CHOL 138 04/15/2023   HDL 47.60 04/15/2023   LDLCALC 33 02/14/2022   TRIG 320.0 (H) 04/15/2023   Lab Results  Component Value Date   DDIMER 1.03 (H) 09/17/2023      Radiology Studies    US  Venous Img Lower Bilateral (DVT) Result Date: 09/17/2023 CLINICAL DATA:  Bilateral leg swelling. EXAM: BILATERAL LOWER EXTREMITY VENOUS DOPPLER ULTRASOUND TECHNIQUE: Gray-scale sonography with graded compression, as well as color Doppler and duplex ultrasound were performed to evaluate the lower extremity deep venous systems from the level of the common femoral vein and including the common femoral, femoral, profunda femoral, popliteal and calf veins including the posterior tibial, peroneal and gastrocnemius veins when visible. The superficial great saphenous vein was also interrogated. Spectral Doppler was utilized to evaluate flow at rest and with distal augmentation maneuvers in the common femoral, femoral and popliteal veins. COMPARISON:  None Available. FINDINGS: RIGHT LOWER EXTREMITY Common Femoral Vein: No evidence of thrombus. Normal compressibility, respiratory phasicity and response to augmentation. Saphenofemoral Junction: No evidence of thrombus. Normal compressibility and flow on color Doppler imaging. Profunda Femoral Vein: No evidence of thrombus. Normal compressibility and  flow on color Doppler imaging. Femoral Vein: No evidence of thrombus. Normal compressibility, respiratory phasicity and response to augmentation. Popliteal Vein: No evidence of thrombus. Normal compressibility, respiratory phasicity and response to augmentation. Calf Veins: No evidence of thrombus. Normal compressibility and flow on color Doppler imaging. Superficial Great Saphenous Vein: No evidence of thrombus. Normal compressibility. Venous Reflux:  None. Other Findings: Complex fluid collection along the right calf measuring 8.8 x 2.9 x 4.3 cm. No abnormal blood flow on Doppler. Few internal echoes. Lobular margins. LEFT  LOWER EXTREMITY Common Femoral Vein: No evidence of thrombus. Normal compressibility, respiratory phasicity and response to augmentation. Saphenofemoral Junction: No evidence of thrombus. Normal compressibility and flow on color Doppler imaging. Profunda Femoral Vein: No evidence of thrombus. Normal compressibility and flow on color Doppler imaging. Femoral Vein: No evidence of thrombus. Normal compressibility, respiratory phasicity and response to augmentation. Popliteal Vein: No evidence of thrombus. Normal compressibility, respiratory phasicity and response to augmentation. Calf Veins: No evidence of thrombus. Normal compressibility and flow on color Doppler imaging. Superficial Great Saphenous Vein: No evidence of thrombus. Normal compressibility. Venous Reflux:  None. Other Findings:  None. IMPRESSION: No evidence of deep venous thrombosis in either lower extremity. 8.8 x 2.9 x 4.3 cm complex fluid collection along the right calf region. This has has a differential including resolving hematoma, infection or other lesion. Please correlate with specific history and further workup/follow up is recommended. Electronically Signed   By: Ranell Bring M.D.   On: 09/17/2023 18:30   CT ABDOMEN PELVIS WO CONTRAST Result Date: 09/17/2023 CLINICAL DATA:  Abdomen pain EXAM: CT ABDOMEN AND PELVIS WITHOUT CONTRAST TECHNIQUE: Multidetector CT imaging of the abdomen and pelvis was performed following the standard protocol without IV contrast. RADIATION DOSE REDUCTION: This exam was performed according to the departmental dose-optimization program which includes automated exposure control, adjustment of the mA and/or kV according to patient size and/or use of iterative reconstruction technique. COMPARISON:  CT 08/17/2022 FINDINGS: Lower chest: Lung bases demonstrate mild bronchiectasis in the lower lobes. Small foci of lower lobe consolidation and peribronchial thickening. Small focus of right middle lobe consolidation.  Coronary vascular calcification. Aortic valve calcifications. Borderline to mild cardiomegaly. Hepatobiliary: No calcified gallstone. No biliary dilatation. Hepatic granuloma. Pancreas: Unremarkable. No pancreatic ductal dilatation or surrounding inflammatory changes. Spleen: Numerous calcified granuloma Adrenals/Urinary Tract: Adrenal glands are normal. Polycystic kidneys bilaterally with numerous simple and complex cysts and subcentimeter hyper and hypodensities too small to further characterize, grossly stable as compared with 2023 and further assessment limited without contrast no hydronephrosis. Decompressed urinary bladder by Foley catheter Stomach/Bowel: Moderate fluid distension of stomach. No evidence for bowel obstruction or bowel wall thickening. Negative appendix. Vascular/Lymphatic: Moderate aortic atherosclerosis. No aneurysm. No suspicious lymph nodes Reproductive: Negative prostate Other: Negative for pelvic effusion or free air. Small fat containing inguinal hernias. Musculoskeletal: No acute or suspicious osseous abnormality. IMPRESSION: 1. No CT evidence for acute intra-abdominal or pelvic abnormality. 2. Mild lower lobe bronchiectasis. Peribronchial thickening with patchy foci of consolidations in the lower lobes and right middle lobe, possible multifocal pneumonia, imaging follow-up to resolution is recommended. 3. Polycystic kidneys. Numerous simple and complex bilateral renal cysts, grossly unchanged, further evaluation limited without contrast. Non emergent/outpatient renal CT or MRI follow-up contrast examination could be performed if desired. 4. Moderate fluid distension of stomach. 5. Aortic atherosclerosis. Aortic Atherosclerosis (ICD10-I70.0). Electronically Signed   By: Luke Bun M.D.   On: 09/17/2023 16:07   CT HEAD WO CONTRAST ( ) Result Date: 09/17/2023  CLINICAL DATA:  Non focal encephalopathy. Evaluate for intracranial hemorrhage or CVA. Acute onset of confusion and  lethargy. EXAM: CT HEAD WITHOUT CONTRAST TECHNIQUE: Contiguous axial images were obtained from the base of the skull through the vertex without intravenous contrast. RADIATION DOSE REDUCTION: This exam was performed according to the departmental dose-optimization program which includes automated exposure control, adjustment of the mA and/or kV according to patient size and/or use of iterative reconstruction technique. COMPARISON:  CT head without contrast 11/17/2021. FINDINGS: Brain: Moderate atrophy and white matter changes demonstrate some progression since the prior exam. No acute infarct, hemorrhage, or mass lesion is present. The ventricles are of normal size. No significant extraaxial fluid collection is present. The brainstem and cerebellum are within normal limits. A relatively empty sella is present. Midline structures are otherwise within normal limits. Vascular: Atherosclerotic calcifications are present within the cavernous internal carotid arteries bilaterally. No hyperdense vessel is present. Skull: Calvarium is intact. No focal lytic or blastic lesions are present. No significant extracranial soft tissue lesion is present. Sinuses/Orbits: Bilateral lens replacements are noted. Globes and orbits are otherwise unremarkable. The paranasal sinuses and mastoid air cells are clear. IMPRESSION: 1. No acute intracranial abnormality or significant interval change. 2. Moderate atrophy and white matter disease demonstrates some progression since the prior exam. This likely reflects the sequela of chronic microvascular ischemia. Electronically Signed   By: Lonni Necessary M.D.   On: 09/17/2023 14:51   DG Chest Port 1 View Result Date: 09/17/2023 CLINICAL DATA:  Sepsis EXAM: PORTABLE CHEST 1 VIEW COMPARISON:  X-ray 08/30/2023. FINDINGS: Enlarged cardiopericardial silhouette. Sternal wires. Stable widened mediastinum with a calcified aorta. Persistent left pleural effusion but improving. Adjacent mild  opacity. Presumed calcification along the left lung apex. No pneumothorax or edema. Overlapping cardiac leads. IMPRESSION: Postop chest.  Enlarged heart. Improving left pleural effusion. Electronically Signed   By: Ranell Bring M.D.   On: 09/17/2023 12:21      ECG & Cardiac Imaging    RSR, 99, inf infarct, RBBB - personally reviewed.  Assessment & Plan    1.  CAD/Demand ischemia:  s/p CABG x 5 in 1997 w/ NSTEMI in 09/2021 w/ 5/5 patent grafts on cath and 70% ost/prox VG  dRCA stenosis.  He was medically managed.  He had two admissions in 08/2023 in the setting of resp failure w/ hsTrops in the 80's on the first admission and up to 341 on the second admission.  Echo showed nl LV fxn, and he was conservatively managed.  Per his wife, Kurt Aguilar hasn't been doing well @ SNF and over the past several days.  He has been increasingly weak, lethargic, and PO intake has been minimal over the past several days.  He was noted to have worsening renal fxn and thrombocytopenia by lab work 1/8, and was subsequently seen by house staff 1/9 w/ AMS, hypoxia, and hypotension, thus prompting referral to the ED.  Here, he remains hypotensive on norepi.  H/H worse, now s/p PRBCs.  Troponins elevated @ 1451  1320.  He denies c/p, though is unable to provide adequate history.  Family denies any recent complaints of c/p.  He is currently on argatroban  (heparin  stopped due to thrombocytopenia).  With multiple co-morbidities and renal failure, he is not currently a candidate for cath.  With hypotension and troponin elevation, will f/u limited echo to re-eval Ef/wall motion.  Plan conservative rx.  No ASA 2/2 thrombocytopenia.  No ? blocker 2/2 hypotension.  No statin 2/2 elev  LFTs.  2.  Shock/Sepsis/Lactic acidosis:  See above.  Mgmt per CCM.  Cont to require norepi.  3.  Acute hypoxic resp failure/Multifocal PNA/tracheobronchomalacia:  Currently on nasal cannula.  Abx per CCM.  5.  Acute on chronic stage IV kidney dzs:  In  setting of poor PO intake and hypotension.  On CRRT.  6.  Macrocytic Anemia:  in setting of worsening renal failure.  Seen by hematology.  S/p prbcs.  Maintain hgb >8 in setting of demand ischemia/known CAD.  7.  Thrombocytopenia:  seen by heme - ? Role of linezolid .  S/p plt transfusion.  8.  DMII:  per CCM.  9.  HL:  statin on hold - LFTs mildly elevated on admission.  Risk Assessment/Risk Scores:     TIMI Risk Score for Unstable Angina or Non-ST Elevation MI:   The patient's TIMI risk score is 5, which indicates a 26% risk of all cause mortality, new or recurrent myocardial infarction or need for urgent revascularization in the next 14 days.      Signed, Lonni Meager, NP 09/18/2023, 10:38 AM  For questions or updates, please contact   Please consult [email or web address] for contact info under Cardiology/STEMI.

## 2023-09-18 NOTE — Procedures (Signed)
 ARTERIAL CATHETER INSERTION PROCEDURE NOTE  Patient Name: Kurt Aguilar   MRN: [id number]   Date of Birth/ Sex: April 10, 1946 , male      Admission Date: 09/17/2023  Attending Provider: Isadora Hose, MD  Primary Diagnosis: Renal failure (ARF), acute on chronic [facility])    Provider Performing: Almarie DELENA Nose   Procedure: Insertion of Arterial Line (63379) with US  guidance (23062)   Indication(s) Blood pressure monitoring and/or need for frequent ABGs  Consent Risks of the procedure as well as the alternatives and risks of each were explained to the patient and/or caregiver.  Consent for the procedure was obtained and is signed in the bedside chart  Anesthesia None  Time Out Verified patient identification, verified procedure, site/side was marked, verified correct patient position, special equipment/implants available, medications/allergies/relevant history reviewed, required imaging and test results available.  Sterile Technique Maximal sterile technique including full sterile barrier drape, hand hygiene, sterile gown, sterile gloves, mask, hair covering, sterile ultrasound probe cover (if used).  Procedure Description Area of catheter insertion was cleaned with chlorhexidine  and draped in sterile fashion. With real-time ultrasound guidance an arterial catheter was placed into the left femoral artery.  Appropriate arterial tracings confirmed on monitor.    Complications/Tolerance None; patient tolerated the procedure well.  EBL Minimal  Specimen(s) None   Almarie Nose, DNP, CCRN, FNP-C, AGACNP-BC Acute Care & Family Nurse Practitioner  [facility]  See Amion for personal pager PCCM on call pager [phone] until 7 am

## 2023-09-18 NOTE — Progress Notes (Signed)
Patient alert, oriented to self. Patient able to follow simple commands.  Blood pressure fluctuating, MD made aware. New labs ordered. Additional pressors started. Pressors titrated per MD as needed.   New CRRT goal, +50 per MD.

## 2023-09-18 NOTE — Consult Note (Signed)
 PHARMACY - ANTICOAGULATION CONSULT NOTE  Pharmacy Consult for Argatroban  Indication: chest pain/ACS - Suspected HIT  Allergies  Allergen Reactions   Oxycodone  Anaphylaxis    02/25/2022    Patient Measurements: Height: 5' 10 (177.8 cm) Weight: 107.7 kg (237 lb 6.4 oz) IBW/kg (Calculated) : 73 Heparin  Dosing Weight: 96.2 kg  Vital Signs: Temp: 96 F (35.6 C) (01/10 0030) Temp Source: Axillary (01/10 0030) BP: 71/40 (01/10 0130) Pulse Rate: 86 (01/10 0130)  Labs: Recent Labs    09/17/23 1218 09/17/23 1408 09/17/23 1549 09/17/23 1722 09/17/23 1949 09/17/23 1950  HGB 7.9*  --  6.9*  --   --  6.7*  HCT 25.7*  --  21.8*  --   --  20.7*  PLT 66*  --  54*  --   --  54*  APTT 28  --   --   --   --  >200*  LABPROT 15.7*  --   --   --   --   --   INR 1.2  --   --   --   --   --   CREATININE 7.03*  --   --  6.89* 7.13*  --   CKTOTAL  --   --   --  129  --   --   TROPONINIHS 1,451* 1,320*  --   --   --   --    Estimated Creatinine Clearance: 10.3 mL/min (A) (by C-G formula based on SCr of 7.13 mg/dL (H)).  Medical History: Past Medical History:  Diagnosis Date   Arthritis    Chronic kidney disease    Complication of anesthesia    constipation and inability to  urinate happened a few days after cataract surgery    Diabetes (HCC)    type 2    Diverticulitis    GERD (gastroesophageal reflux disease)    Gout    Heart attack (HCC)    Heart disease    Heart murmur    Hyperglycemia    Hyperlipidemia    Hypertension    Osteoporosis    Pneumonia    hx of BOOP followed by Dr Darlean    Sleep apnea    cpapp - setting at 17    Medications:  No anticoagulation per chart review  Assessment: 78 yo male presenting for confusion and lethargy. In Ed, found to have elevated troponin and pharmacy was consulted to initiate heparin  infusion. On presentation, patient had low platelet count of 81, which trended down this AM to 66. Per ED provider, heparin  bolus and infusion started at  1318 today, as patient had concern for ACS, hgb was low but stable, and no signs of bleeding.  Upon recheck of CBC, patient's platelets are now 54. Pharmacy has been consulted to transition to argatroban  out of concern for possible heparin  induced thrombocytopenia.  Patient does not have a documented history of thrombocytopenia and, of note, patient was hospitalized recently (08/2023) for HCAP and they received heparin  infusion from 12/22 - 12/24 at 1,000 units/hr as well as subcutaneous heparin  injections (5,000 units q8hours) from 12/24-12/29. During that admission, patient had a stable platelet count in mid to low 200s.   Baseline labs: aPTT ordered, hgb 6.9, plt 54, INR 1.2  Goal of Therapy:  aPTT 50-90 seconds Monitor platelets by anticoagulation protocol: Yes  1/10 0129 aPTT 112, supratherapeutic   Plan:  Notified CHARITY FUNDRAISER.  Hold for ~ 30-45 minutes Restart argatroban  at 0.35 mcg/kg/min  Check aPTT 4 hours after restart  until therapeutic x2 Then check a minimum of daily  Monitor CBC and H&H daily   Thank you for involving pharmacy in this patient's care.   Rankin CANDIE Dills, PharmD, [facility] 09/18/2023 2:23 AM

## 2023-09-19 DIAGNOSIS — D696 Thrombocytopenia, unspecified: Secondary | ICD-10-CM | POA: Diagnosis not present

## 2023-09-19 DIAGNOSIS — N179 Acute kidney failure, unspecified: Secondary | ICD-10-CM | POA: Diagnosis not present

## 2023-09-19 DIAGNOSIS — R579 Shock, unspecified: Secondary | ICD-10-CM | POA: Diagnosis not present

## 2023-09-19 DIAGNOSIS — I214 Non-ST elevation (NSTEMI) myocardial infarction: Secondary | ICD-10-CM | POA: Diagnosis not present

## 2023-09-19 DIAGNOSIS — I959 Hypotension, unspecified: Secondary | ICD-10-CM

## 2023-09-19 DIAGNOSIS — E872 Acidosis, unspecified: Secondary | ICD-10-CM | POA: Diagnosis not present

## 2023-09-19 DIAGNOSIS — Z515 Encounter for palliative care: Secondary | ICD-10-CM | POA: Diagnosis not present

## 2023-09-19 LAB — TYPE AND SCREEN
ABO/RH(D): A POS
Antibody Screen: NEGATIVE
Unit division: 0

## 2023-09-19 LAB — RENAL FUNCTION PANEL
Albumin: 2.4 g/dL — ABNORMAL LOW (ref 3.5–5.0)
Anion gap: 25 — ABNORMAL HIGH (ref 5–15)
BUN: 56 mg/dL — ABNORMAL HIGH (ref 8–23)
CO2: 9 mmol/L — ABNORMAL LOW (ref 22–32)
Calcium: 7.9 mg/dL — ABNORMAL LOW (ref 8.9–10.3)
Chloride: 102 mmol/L (ref 98–111)
Creatinine, Ser: 2.61 mg/dL — ABNORMAL HIGH (ref 0.61–1.24)
GFR, Estimated: 24 mL/min — ABNORMAL LOW (ref >60)
Glucose, Bld: 123 mg/dL — ABNORMAL HIGH (ref 70–99)
Phosphorus: 5.4 mg/dL — ABNORMAL HIGH (ref 2.5–4.6)
Potassium: 4.3 mmol/L (ref 3.5–5.1)
Sodium: 136 mmol/L (ref 135–145)

## 2023-09-19 LAB — AMMONIA: Ammonia: 52 umol/L — ABNORMAL HIGH (ref 9–35)

## 2023-09-19 LAB — GASTROINTESTINAL PANEL BY PCR, STOOL (REPLACES STOOL CULTURE)

## 2023-09-19 LAB — CBC
HCT: 21.6 % — ABNORMAL LOW (ref 39.0–52.0)
Hemoglobin: 6.6 g/dL — ABNORMAL LOW (ref 13.0–17.0)
MCH: 29.7 pg (ref 26.0–34.0)
MCHC: 30.6 g/dL (ref 30.0–36.0)
MCV: 97.3 fL (ref 80.0–100.0)
Platelets: 60 10*3/uL — ABNORMAL LOW (ref 150–400)
RBC: 2.22 MIL/uL — ABNORMAL LOW (ref 4.22–5.81)
RDW: 20.1 % — ABNORMAL HIGH (ref 11.5–15.5)
WBC: 9.3 10*3/uL (ref 4.0–10.5)
nRBC: 0.2 % (ref 0.0–0.2)

## 2023-09-19 LAB — HEPARIN INDUCED PLATELET AB (HIT ANTIBODY): Heparin Induced Plt Ab: 0.038 {OD_unit} (ref 0.000–0.400)

## 2023-09-19 LAB — GLUCOSE, CAPILLARY
Glucose-Capillary: 108 mg/dL — ABNORMAL HIGH (ref 70–99)
Glucose-Capillary: 102 mg/dL — ABNORMAL HIGH (ref 70–99)
Glucose-Capillary: 123 mg/dL — ABNORMAL HIGH (ref 70–99)

## 2023-09-19 LAB — BPAM RBC
Blood Product Expiration Date: 202502102359
ISSUE DATE / TIME: 202501100351
Unit Type and Rh: 6200

## 2023-09-19 LAB — T3, FREE: T3, Free: 1.1 pg/mL — ABNORMAL LOW (ref 2.0–4.4)

## 2023-09-19 LAB — MAGNESIUM: Magnesium: 2 mg/dL (ref 1.7–2.4)

## 2023-09-19 LAB — APTT
aPTT: 91 s — ABNORMAL HIGH (ref 24–36)
aPTT: 97 s — ABNORMAL HIGH (ref 24–36)

## 2023-09-19 MED ORDER — FENTANYL CITRATE PF 50 MCG/ML IJ SOSY
25.0000 ug | PREFILLED_SYRINGE | INTRAMUSCULAR | Status: DC | PRN
  Administered 2023-09-19 (×3): 50 ug via INTRAVENOUS
  Administered 2023-09-19: 25 ug via INTRAVENOUS
  Filled 2023-09-19 (×4): qty 1

## 2023-09-19 MED ORDER — LORAZEPAM 2 MG/ML IJ SOLN
1.0000 mg | Freq: Once | INTRAMUSCULAR | Status: AC
  Administered 2023-09-19: 1 mg via INTRAVENOUS

## 2023-09-19 MED ORDER — LORAZEPAM 2 MG/ML IJ SOLN
1.0000 mg | INTRAMUSCULAR | Status: DC | PRN
  Administered 2023-09-19 (×3): 1 mg via INTRAVENOUS
  Filled 2023-09-19 (×4): qty 1

## 2023-09-19 MED ORDER — FENTANYL CITRATE PF 50 MCG/ML IJ SOSY
12.5000 ug | PREFILLED_SYRINGE | INTRAMUSCULAR | Status: DC | PRN
  Administered 2023-09-19: 12.5 ug via INTRAVENOUS
  Filled 2023-09-19: qty 1

## 2023-09-19 MED ORDER — GLYCOPYRROLATE 0.2 MG/ML IJ SOLN
0.2000 mg | INTRAMUSCULAR | Status: DC | PRN
  Administered 2023-09-19: 0.2 mg via INTRAVENOUS
  Filled 2023-09-19: qty 1

## 2023-09-19 MED ORDER — LORAZEPAM 2 MG/ML IJ SOLN
1.0000 mg | Freq: Once | INTRAMUSCULAR | Status: AC
  Administered 2023-09-19: 1 mg via INTRAVENOUS
  Filled 2023-09-19: qty 1

## 2023-09-19 MED ORDER — GLYCOPYRROLATE 0.2 MG/ML IJ SOLN: 0.2000 mg | INTRAMUSCULAR | Status: DC | PRN

## 2023-09-19 MED ORDER — GLYCOPYRROLATE 1 MG PO TABS: 1.0000 mg | ORAL_TABLET | ORAL | Status: DC | PRN

## 2023-09-19 MED ORDER — POLYVINYL ALCOHOL 1.4 % OP SOLN: 1.0000 [drp] | Freq: Four times a day (QID) | OPHTHALMIC | Status: DC | PRN

## 2023-09-19 NOTE — Progress Notes (Signed)
 OT Cancellation Note  Patient Details Name: QUENTEN NAWAZ MRN: [id number] DOB: 02/17/1946   Cancelled Treatment:    Reason Eval/Treat Not Completed: Other (comment) (Per RN, pt transitioning to comfort care. Will dc in house. Please re-order if needs change.)  Amairany Schumpert 09/19/2023, 10:54 AM

## 2023-09-19 NOTE — IPAL (Signed)
  Interdisciplinary Goals of Care Family Meeting   Date carried out: 09/19/2023  Location of the meeting: Bedside  Member's involved: Physician and Family Member or next of kin  Durable Power of Attorney or acting medical decision maker: Wife and daughter    Discussion: We discussed goals of care for Kurt Aguilar. Discussed overall condition with progressive shock, metabolic acidosis, lactic acid build up, and renal failure. Family understand his overall multi-organ failure precludes meaningful recovery with return to previous functional status. They would like to re-focus goals of care to comfort measures only. We will hold CRRT and discontinue vasopressors. Patient was comfortable at the time of our conversations and my exam.  Code status:   Code Status: Limited: Do not attempt resuscitation (DNR) -DNR-LIMITED -Do Not Intubate/DNI    Disposition: In-patient comfort care  Time spent for the meeting: 25 minutes    Belva November, MD  09/19/2023, 12:18 PM

## 2023-09-19 NOTE — Plan of Care (Signed)
  Problem: Education: Goal: Knowledge of General Education information will improve Description: Including pain rating scale, medication(s)/side effects and non-pharmacologic comfort measures Outcome: Not Progressing   Problem: Health Behavior/Discharge Planning: Goal: Ability to manage health-related needs will improve Outcome: Not Progressing   Problem: Clinical Measurements: Goal: Ability to maintain clinical measurements within normal limits will improve Outcome: Not Progressing Goal: Will remain free from infection Outcome: Not Progressing Goal: Diagnostic test results will improve Outcome: Not Progressing Goal: Respiratory complications will improve Outcome: Not Progressing Goal: Cardiovascular complication will be avoided Outcome: Not Progressing   Problem: Nutrition: Goal: Adequate nutrition will be maintained Outcome: Not Progressing   Problem: Coping: Goal: Level of anxiety will decrease Outcome: Not Progressing   Problem: Education: Goal: Ability to describe self-care measures that may prevent or decrease complications (Diabetes Survival Skills Education) will improve Outcome: Not Progressing Goal: Individualized Educational Video(s) Outcome: Not Progressing   Problem: Skin Integrity: Goal: Risk for impaired skin integrity will decrease Outcome: Not Progressing

## 2023-09-19 NOTE — Consult Note (Signed)
 PHARMACY - ANTICOAGULATION CONSULT NOTE  Pharmacy Consult for Argatroban  Indication: chest pain/ACS - Suspected HIT  Allergies  Allergen Reactions   Oxycodone  Anaphylaxis    02/25/2022    Patient Measurements: Height: 5' 10 (177.8 cm) Weight: 109.5 kg (241 lb 6.5 oz) IBW/kg (Calculated) : 73 Heparin  Dosing Weight: 96.2 kg  Vital Signs: Temp: 97.7 F (36.5 C) (01/11 0400) Temp Source: Axillary (01/11 0400) BP: 106/83 (01/10 2000) Pulse Rate: 39 (01/11 0500)  Labs: Recent Labs    09/17/23 1218 09/17/23 1408 09/17/23 1549 09/17/23 1722 09/17/23 1949 09/18/23 0636 09/18/23 0758 09/18/23 1342 09/18/23 1710 09/18/23 1815 09/19/23 0350  HGB 7.9*  --    < >  --    < > 7.6*  --  7.1*  --   --  6.6*  HCT 25.7*  --    < >  --    < > 23.3*  --  21.8*  --   --  21.6*  PLT 66*  --    < >  --    < > 88*  --  74*  --   --  60*  APTT 28  --   --   --    < >  --  77*  --   --  72* 91*  LABPROT 15.7*  --   --   --   --   --   --   --   --   --   --   INR 1.2  --   --   --   --   --   --   --   --   --   --   CREATININE 7.03*  --   --  6.89*   < >  --   --   --  3.61* 3.56* 2.61*  CKTOTAL  --   --   --  129  --   --   --   --   --   --   --   TROPONINIHS 1,451* 1,320*  --   --   --   --   --   --   --   --   --    < > = values in this interval not displayed.   Estimated Creatinine Clearance: 28.4 mL/min (A) (by C-G formula based on SCr of 2.61 mg/dL (H)).  Medical History: Past Medical History:  Diagnosis Date   Aortic stenosis    a. 08/2023 Echo: EF 55-60%, no rwma, mild LVH, nl RV fxn, mod dil LA, mild MS, mild-mod AS.   Arthritis    Asthma    CAD (coronary artery disease)    a. 1997 CABG x 5 [place], [place]): LIMA->LAD, VG->D1, VG->OM1->OM2, VG->dRCA; b. 09/2021 NSTEMI/Cath: LM 99ost, LAD diff dzs, D1 100, D2/3 sev dzs, LCX 100ost/p, RCA 100p, VG->dRCA 70ost/p, VG->OM1->OM2 ok, LIMA->LAD ok, VG->D1 ok-->Med rx.   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 01/2023  Zio: predominantly sinus rhythm @ 100 (58-176).  2 SVT runs, fastest 176, longest 16 beats. Rare PACs/PVCs. No triggered events.   Sleep apnea    cpapp - setting at 17    Tracheobronchomalacia    Type II diabetes mellitus (HCC)    type 2    Medications:  No anticoagulation per chart review  Assessment: 78 yo male presenting for confusion and lethargy. In Ed, found to have elevated troponin and pharmacy was consulted to initiate heparin  infusion. On presentation, patient had low platelet count of 81, which trended down this AM to 66. Per ED provider, heparin  bolus and infusion started at 1318 today, as patient had concern for ACS, hgb was low but stable, and no signs of bleeding.  Upon recheck of CBC, patient's platelets are now 54. Pharmacy has been consulted to transition to argatroban  out of concern for possible heparin  induced thrombocytopenia.  Patient does not have a documented history of thrombocytopenia and, of note, patient was hospitalized recently (08/2023) for HCAP and they received heparin  infusion from 12/22 - 12/24 at 1,000 units/hr as well as subcutaneous heparin  injections (5,000 units q8hours) from 12/24-12/29. During that admission, patient had a stable platelet count in mid to low 200s.   Baseline labs: aPTT 28s hgb 6.9, plt 54, INR 1.2  Date Time aPTT Rate/Comment 1/10 0758 77 Therapeutic x 1 1/10 1815 72 Therapeutic x 2 1/11 0350 91 Supratherapeutic  Goal of Therapy:  aPTT 50-90 seconds Monitor platelets by anticoagulation protocol: Yes   Plan: Decrease argatroban  to  0.31 mcg/kg/min  Recheck aPTT in 4 hrs after rate change Monitor CBC and H&H daily  Thank you for involving pharmacy in this patient's care.   Rankin CANDIE Dills, PharmD, [facility] 09/19/2023 5:29 AM

## 2023-09-19 NOTE — Progress Notes (Signed)
 Family has decided to proceed with comfort care, therefore CRRT to be stopped.   Thanks for allowing [place] to participate, no further renal input.

## 2023-09-19 NOTE — Plan of Care (Signed)
  Problem: Education: Goal: Knowledge of General Education information will improve Description: Including pain rating scale, medication(s)/side effects and non-pharmacologic comfort measures Outcome: Progressing   Problem: Clinical Measurements: Goal: Will remain free from infection Outcome: Progressing Goal: Respiratory complications will improve Outcome: Progressing   Problem: Elimination: Goal: Will not experience complications related to urinary retention Outcome: Progressing   Problem: Pain Management: Goal: General experience of comfort will improve Outcome: Progressing   Problem: Safety: Goal: Ability to remain free from injury will improve Outcome: Progressing   Problem: Education: Goal: Individualized Educational Video(s) Outcome: Progressing   Problem: Health Behavior/Discharge Planning: Goal: Ability to identify and utilize available resources and services will improve Outcome: Progressing   Problem: Metabolic: Goal: Ability to maintain appropriate glucose levels will improve Outcome: Progressing   Problem: Nutritional: Goal: Progress toward achieving an optimal weight will improve Outcome: Progressing   Problem: Skin Integrity: Goal: Risk for impaired skin integrity will decrease Outcome: Progressing   Problem: Tissue Perfusion: Goal: Adequacy of tissue perfusion will improve Outcome: Progressing   Problem: Health Behavior/Discharge Planning: Goal: Ability to manage health-related needs will improve Outcome: Not Progressing   Problem: Clinical Measurements: Goal: Ability to maintain clinical measurements within normal limits will improve Outcome: Not Progressing Goal: Diagnostic test results will improve Outcome: Not Progressing Goal: Cardiovascular complication will be avoided Outcome: Not Progressing   Problem: Activity: Goal: Risk for activity intolerance will decrease Outcome: Not Progressing   Problem: Nutrition: Goal: Adequate  nutrition will be maintained Outcome: Not Progressing   Problem: Coping: Goal: Level of anxiety will decrease Outcome: Not Progressing   Problem: Elimination: Goal: Will not experience complications related to bowel motility Outcome: Not Progressing   Problem: Skin Integrity: Goal: Risk for impaired skin integrity will decrease Outcome: Not Progressing   Problem: Education: Goal: Ability to describe self-care measures that may prevent or decrease complications (Diabetes Survival Skills Education) will improve Outcome: Not Progressing   Problem: Coping: Goal: Ability to adjust to condition or change in health will improve Outcome: Not Progressing   Problem: Fluid Volume: Goal: Ability to maintain a balanced intake and output will improve Outcome: Not Progressing   Problem: Health Behavior/Discharge Planning: Goal: Ability to manage health-related needs will improve Outcome: Not Progressing   Problem: Nutritional: Goal: Maintenance of adequate nutrition will improve Outcome: Not Progressing

## 2023-09-19 NOTE — Consult Note (Signed)
 PHARMACY - ANTICOAGULATION CONSULT NOTE  Pharmacy Consult for Argatroban  Indication: chest pain/ACS - Suspected HIT  Allergies  Allergen Reactions   Oxycodone  Anaphylaxis    02/25/2022    Patient Measurements: Height: 5' 10 (177.8 cm) Weight: 109.5 kg (241 lb 6.5 oz) IBW/kg (Calculated) : 73 Heparin  Dosing Weight: 96.2 kg  Vital Signs: Temp: 97.7 F (36.5 C) (01/11 0400) Temp Source: Axillary (01/11 0400) Pulse Rate: 58 (01/11 1115)  Labs: Recent Labs    09/17/23 1218 09/17/23 1408 09/17/23 1549 09/17/23 1722 09/17/23 1949 09/18/23 0636 09/18/23 0758 09/18/23 1342 09/18/23 1710 09/18/23 1815 09/19/23 0350 09/19/23 0952  HGB 7.9*  --    < >  --    < > 7.6*  --  7.1*  --   --  6.6*  --   HCT 25.7*  --    < >  --    < > 23.3*  --  21.8*  --   --  21.6*  --   PLT 66*  --    < >  --    < > 88*  --  74*  --   --  60*  --   APTT 28  --   --   --    < >  --    < >  --   --  72* 91* 97*  LABPROT 15.7*  --   --   --   --   --   --   --   --   --   --   --   INR 1.2  --   --   --   --   --   --   --   --   --   --   --   CREATININE 7.03*  --   --  6.89*   < >  --   --   --  3.61* 3.56* 2.61*  --   CKTOTAL  --   --   --  129  --   --   --   --   --   --   --   --   TROPONINIHS 1,451* 1,320*  --   --   --   --   --   --   --   --   --   --    < > = values in this interval not displayed.   Estimated Creatinine Clearance: 28.4 mL/min (A) (by C-G formula based on SCr of 2.61 mg/dL (H)).  Medical History: Past Medical History:  Diagnosis Date   Aortic stenosis    a. 08/2023 Echo: EF 55-60%, no rwma, mild LVH, nl RV fxn, mod dil LA, mild MS, mild-mod AS.   Arthritis    Asthma    CAD (coronary artery disease)    a. 1997 CABG x 5 [place], [place]): LIMA->LAD, VG->D1, VG->OM1->OM2, VG->dRCA; b. 09/2021 NSTEMI/Cath: LM 99ost, LAD diff dzs, D1 100, D2/3 sev dzs, LCX 100ost/p, RCA 100p, VG->dRCA 70ost/p, VG->OM1->OM2 ok, LIMA->LAD ok, VG->D1 ok-->Med rx.   CKD (chronic kidney disease),  stage IV (HCC)    Complication of anesthesia    constipation and inability to  urinate happened a few days after cataract surgery    Diverticulitis    GERD (gastroesophageal reflux disease)    Gout    Heart attack (HCC)    Heart murmur    Hyperlipidemia    Hypertension    Mild mitral stenosis    Osteoporosis  Pneumonia    hx of BOOP followed by Dr Darlean    PSVT (paroxysmal supraventricular tachycardia) (HCC)    a. 01/2023 Zio: predominantly sinus rhythm @ 100 (58-176).  2 SVT runs, fastest 176, longest 16 beats. Rare PACs/PVCs. No triggered events.   Sleep apnea    cpapp - setting at 17    Tracheobronchomalacia    Type II diabetes mellitus (HCC)    type 2    Medications:  No anticoagulation per chart review  Assessment: 78 yo male presenting for confusion and lethargy. In Ed, found to have elevated troponin and pharmacy was consulted to initiate heparin  infusion. On presentation, patient had low platelet count of 81, which trended down this AM to 66. Per ED provider, heparin  bolus and infusion started at 1318 today, as patient had concern for ACS, hgb was low but stable, and no signs of bleeding.  Upon recheck of CBC, patient's platelets are now 54. Pharmacy has been consulted to transition to argatroban  out of concern for possible heparin  induced thrombocytopenia.  Patient does not have a documented history of thrombocytopenia and, of note, patient was hospitalized recently (08/2023) for HCAP and they received heparin  infusion from 12/22 - 12/24 at 1,000 units/hr as well as subcutaneous heparin  injections (5,000 units q8hours) from 12/24-12/29. During that admission, patient had a stable platelet count in mid to low 200s.   Baseline labs: aPTT 28s hgb 6.9, plt 54, INR 1.2  Date Time aPTT Rate/Comment 1/10 0758 77 Therapeutic x 1 1/10 1815 72 Therapeutic x 2 1/11 0350 91 SUPRAtherapeutic 1/11 0952 97 SUPRAtherapeutic   Goal of Therapy:  aPTT 50-90 seconds Monitor platelets  by anticoagulation protocol: Yes   Plan: Decrease argatroban  10% to 0.27 mcg/kg/min from 0.31 mcg/kg/min Recheck aPTT in 4 hrs after rate change Monitor CBC and H&H daily  Thank you for involving pharmacy in this patient's care.   Alfonso MARLA Buys, PharmD Pharmacy Resident  09/19/2023 11:51 AM

## 2023-09-19 NOTE — Progress Notes (Signed)
 SLP Cancellation Note  Patient Details Name: EXCELL NEYLAND MRN: [id number] DOB: 09-26-1945   Cancelled treatment:       Reason Eval/Treat Not Completed: Other (comment) Pt transitioned to comfort care. RN made aware of option to liberalize diet if desired by pt/family. No further SLP services indicated at this time.   Rasheena Talmadge Clapp  MS [facility] SLP    Penny Frisbie J Clapp 09/19/2023, 12:27 PM

## 2023-09-19 NOTE — Progress Notes (Signed)
 PT Cancellation Note  Patient Details Name: Kurt Aguilar MRN: [id number] DOB: [birth year]   Cancelled Treatment:    Reason Eval/Treat Not Completed: Other (comment). Comfort care. Will dc in house. Please re-order if needs change.   Guenther Dunshee 09/19/2023, 10:17 AM Corean Dade, PT, DPT, GCS [phone]

## 2023-09-19 NOTE — Progress Notes (Addendum)
 NAME:  Kurt Aguilar, MRN:  [id number], DOB:  08/09/1946, LOS: 2 ADMISSION DATE:  09/17/2023, CHIEF COMPLAINT:  Shock, renal failure   History of Present Illness:   This is a 78 yo male with a PMH CAD s/p CABG in 1997, moderate mitral valve stenosis, severe tracheobronchomalacia, CKD stage IV, type 2 diabetes mellitus, HTN, HLD, and OSA on CPAP.  He presented to [facility] ER on 01/09 via EMS from [facility] with altered mental status.  Pts wife at bedside reports the pt has had poor po intake over the past 2 days and an inability to urinate.  He also has had back spasms along with twitching movements.  Per outpatient facility documentation pts recent blood work results were abnormal due to creatinine increasing from 3.35 to 5.35.  He has had 2 hospitalizations in December 2024 with acute hypoxic respiratory failure secondary to pneumonia with MRSE, rhinovirus, elevated troponin's, acute kidney injury superimposed on CKD stage IV, and tracheobronchomalacia.   ED Course Upon arrival to the ER pt hypotensive.  Significant lab results were: K+ 6.1/CO2 13/glucose 138/BUN 155/creatinine 7.03/calcium  7.3/anion gap 21/albumin  3.1/AST 66/ALT 61/BNP 320.1/troponin 1,451/lactic acid 6.3/pct 0.64/hgb 7.9/platelet count 66.  EKG revealed sinus rhythm with RBBB which is not a new finding, but no ST elevation.  CXR and Resp panel by RT-PCR negative.  CT Abd Pelvis negative for acute intra-abdominal or pelvic abnormality, although concerning for multifocal pneumonia.  Pt received 100 mg of iv solucortef, 10 units of iv insulin , 1 amp of D50W, 10 g of lokelma , 2L LR bolus, and heparin  bolus followed by heparin  gtt.   Despite aggressive iv fluid resuscitation bp readings remained soft and PCCM team contacted for ICU admission.    CT Abd Pelvis WO Contrast: No CT evidence for acute intra-abdominal or pelvic abnormality. Mild lower lobe bronchiectasis. Peribronchial thickening with patchy foci of consolidations in the lower  lobes and right middle lobe, possible multifocal pneumonia, imaging follow-up to resolution is recommended. Polycystic kidneys. Numerous simple and complex bilateral renal cysts, grossly unchanged, further evaluation limited without contrast. Non emergent/outpatient renal CT or MRI follow-up contrast examination could be performed if desired. Moderate fluid distension of stomach. Aortic atherosclerosis.   CT Head: No acute intracranial abnormality or significant interval change. Moderate atrophy and white matter disease demonstrates some progression since the prior exam. This likely reflects the sequela of chronic microvascular ischemia.  Pertinent  Medical History   CAD s/p CABG in 1997 Moderate mitral valve stenosis Severe tracheobronchomalacia CKD stage IV Type II diabetes mellitus HTN HLD OSA on CPAP  Significant Hospital Events: Including procedures, antibiotic start and stop dates in addition to other pertinent events   01/9: Admitted to ICU with hypotension multifactorial due to possible adrenal insufficiency/septic shock, and hypovolemia, acute kidney injury superimposed on CKD stage IV with hyperkalemia, lactic and metabolic acidosis, elevated troponin secondary to demand ischemia vs. NSTEMI, macrocytic anemia, and thrombocytopenia     01/10:  Started on CRRT overnight due to worsening AKI and metabolic acidosis.  Requiring Levophed  with escalating requirements, add Vasopressin . Remains Encephalopathic.  Transfused 1 unit platelets for HD catheter insertion, given 1 unit pRBCs for Hgb of 6.7 with appropriate response in Hgb to 7.6.  Cardiology plans to repeat Echo. Consult Palliative Care for GOC discussions. 1/11: disoriented overnight, palliative consulted, family would like to transition care to comfort measures only  Interim History / Subjective:  -continued on CRRT -disoriented overnight  Objective   Blood pressure 106/83, pulse (!) 39,  temperature 97.7 F (36.5 C),  temperature source Axillary, resp. rate 17, height 5' 10 (1.778 m), weight 109.5 kg, SpO2 91%.        Intake/Output Summary (Last 24 hours) at 09/19/2023 0954 Last data filed at 09/19/2023 0900 Gross per 24 hour  Intake 856.69 ml  Output 118 ml  Net 738.69 ml   Filed Weights   09/17/23 1152 09/18/23 0500 09/19/23 0400  Weight: 107.7 kg 110.2 kg 109.5 kg    Examination: Physical Exam Constitutional:      General: He is in acute distress.     Appearance: He is ill-appearing.  Cardiovascular:     Pulses: Normal pulses.     Heart sounds: Normal heart sounds.  Pulmonary:     Breath sounds: Normal breath sounds.  Abdominal:     General: There is distension.     Palpations: Abdomen is soft.  Musculoskeletal:     Right lower leg: Edema present.     Left lower leg: Edema present.  Neurological:     Mental Status: He is disoriented.      Assessment & Plan:   Kurt Aguilar is a 78 year old male with history of tracheobronchomalacia and asthma previously on chronic immune suppression presenting to the hospital with hypotension, altered mental status and renal failure.   Neurology #Toxic Metabolic Encephalopathy #Uremic Encephalopathy  Toxic metabolic encephalopathy in the setting of uremia, but remains able to maintain airway (and is DNI). Initiated on CRRT with minimal improvement. Administered lorazepam  for agitation given overall goals of care discussion and plan to transition to comfort measures today.  Cardiovascular #Hypovolemic Shock #Septic Shock #Lactic acidosis #NSTEMI  Initially unclear etiology behind shock but with positive norovirus in stool study I suspect he developed severe hypovolemia from diarrhea resulting in shock, renal failure, and NSTEMI. This was all compounded by adrenal insufficiency. Held off on sending for a CTA of the abdomen to assess for mesenteric ischemia given he is anti-coagulated. Lower extremity duplex shows potential hematoma that the  wife confirms is secondary to recent trauma - no sign of compartment syndrome noted. Treating NSTEMI medically; cardiology consult appreciated  -nor-epi for goal MAP > 65 mmHg  Pulmonary #Acute Hypoxic Respiratory Failure #Tracheobronchomalacia  He was previously given a diagnosis of BOOP and maintained on steroids, though on HRCT he is now found to have tracheobronchomalacia. Previously admitted for respiratory failure but current chest imaging doesn't show signs of acute respiratory infection. Currently maintained on nasal cannula and nocturnal CPAP without problem.  -goal SpO2 > 92% -continue nebs  Gastrointestinal #Norovirus Infection  Found to be positive for norovirus, with diarrhea and hypovolemia explaining circulatory shock. Currently NPO given mental status.  Renal #Acute Kidney Injury #Metabolic Acidosis  Acute kidney injury in the setting of shock, with profound metabolic acidosis secondary to lactic acid build up. He is on CRRT at the moment. Discussed with family who would like to transition goals of care to comfort measures and express that he wouldn't want long term dialysis.  Endocrine #Adrenal Insufficiency  Chronically on steroids, started on IV hydrocortisone  for stress dosing. ICU glycemic protocol.  Hem/Onc #Thrombocytopenia #Anemia  Blood work suggests against TTP or DIC - heparin  held and argatroban  started while HIT panel results. Patient is DNR/DNI and family is considering transitioning goals of care to comfort measures only.  ID #NoroVirus Infection  Found to have norovirus infection. Blood cultures no growth to date, beta d and aspergillus ag pending. He is on broad spectrum antibiotics.  Best Practice (right click and Reselect all SmartList Selections daily)   Diet/type: NPO DVT prophylaxis other Pressure ulcer(s): N/A GI prophylaxis: PPI Lines: Central line and Arterial Line Foley:  Yes, and it is still needed Code Status:  DNR Last  date of multidisciplinary goals of care discussion [09/19/2023]  Labs   CBC: Recent Labs  Lab 09/16/23 0000 09/16/23 0000 09/17/23 1218 09/17/23 1549 09/17/23 1950 09/18/23 0636 09/18/23 1342 09/19/23 0350  WBC 7.5   < > 5.8 8.0 8.2 8.5 8.7 9.3  NEUTROABS 6,210.00  --  4.9 7.1 7.4  --   --   --   HGB 7.9*  --  7.9* 6.9* 6.7* 7.6* 7.1* 6.6*  HCT 25*  --  25.7* 21.8* 20.7* 23.3* 21.8* 21.6*  MCV  --    < > 104.0* 102.8* 102.0* 92.5 92.0 97.3  PLT 81*  --  66* 54* 54* 88* 74* 60*   < > = values in this interval not displayed.    Basic Metabolic Panel: Recent Labs  Lab 09/17/23 1949 09/18/23 0507 09/18/23 1710 09/18/23 1815 09/19/23 0350  NA 137 135 139 137 136  K 5.6* 5.0 4.6 4.5 4.3  CL 104 101 105 103 102  CO2 12* 12* 13* 11* 9*  GLUCOSE 142* 141* 127* 136* 123*  BUN 150* 129* 81* 75* 56*  CREATININE 7.13* 6.09* 3.61* 3.56* 2.61*  CALCIUM  6.9* 6.8* 8.2* 8.1* 7.9*  MG  --  2.3  --   --  2.0  PHOS  --  8.2* 5.8*  --  5.4*   GFR: Estimated Creatinine Clearance: 28.4 mL/min (A) (by C-G formula based on SCr of 2.61 mg/dL (H)). Recent Labs  Lab 09/17/23 1218 09/17/23 1408 09/17/23 1950 09/18/23 0140 09/18/23 0636 09/18/23 0943 09/18/23 1219 09/18/23 1342 09/18/23 1815 09/18/23 2116 09/19/23 0350  PROCALCITON 0.64  --   --   --   --   --   --   --   --   --   --   WBC 5.8   < > 8.2  --  8.5  --   --  8.7  --   --  9.3  LATICACIDVEN 6.3*   < > 4.7*   < >  --  7.7* 8.4*  --  >9.0* >9.0*  --    < > = values in this interval not displayed.    Liver Function Tests: Recent Labs  Lab 09/16/23 0000 09/17/23 1218 09/17/23 1950 09/18/23 0505 09/18/23 1710 09/19/23 0350  AST 51* 66* 60*  --   --   --   ALT 58* 61* 54*  --   --   --   ALKPHOS 75 65 59  --   --   --   BILITOT  --  0.9 1.2  --   --   --   PROT  --  5.5* 4.9*  --   --   --   ALBUMIN  3.6 3.1* 2.7* 2.5* 2.6* 2.4*   No results for input(s): LIPASE, AMYLASE in the last 168 hours. Recent Labs   Lab 09/19/23 0413  AMMONIA 52*    ABG    Component Value Date/Time   PHART 7.37 09/18/2023 1748   PCO2ART 19 (LL) 09/18/2023 1748   PO2ART 158 (H) 09/18/2023 1748   HCO3 11.0 (L) 09/18/2023 1748   TCO2 20 (L) 09/17/2021 1856   ACIDBASEDEF 11.8 (H) 09/18/2023 1748   O2SAT 99.1 09/18/2023 1748     Coagulation Profile: Recent  Labs  Lab 09/17/23 1218  INR 1.2    Cardiac Enzymes: Recent Labs  Lab 09/17/23 1722  CKTOTAL 129    HbA1C: Hemoglobin A1C  Date/Time Value Ref Range Status  09/16/2023 12:00 AM 8.2  Final  05/08/2023 11:11 AM 6.3 (A) 4.0 - 5.6 % Final  09/05/2022 01:26 PM 7.9 (A) 4.0 - 5.6 % Final   Hgb A1c MFr Bld  Date/Time Value Ref Range Status  05/17/2020 02:44 PM 6.6 (H) 4.8 - 5.6 % Final    Comment:    (NOTE) Pre diabetes:          5.7%-6.4%  Diabetes:              >6.4%  Glycemic control for   <7.0% adults with diabetes   03/24/2019 09:25 AM 6.3 4.6 - 6.5 % Final    Comment:    Glycemic Control Guidelines for People with Diabetes:Non Diabetic:  <6%Goal of Therapy: <7%Additional Action Suggested:  >8%     CBG: Recent Labs  Lab 09/18/23 1605 09/18/23 1938 09/18/23 2341 09/19/23 0354 09/19/23 0755  GLUCAP 116* 119* 125* 108* 102*    Review of Systems:   Unable to obtain  Past Medical History:  He,  has a past medical history of Aortic stenosis, Arthritis, Asthma, CAD (coronary artery disease), CKD (chronic kidney disease), stage IV (HCC), Complication of anesthesia, Diverticulitis, GERD (gastroesophageal reflux disease), Gout, Heart attack (HCC), Heart murmur, Hyperlipidemia, Hypertension, Mild mitral stenosis, Osteoporosis, Pneumonia, PSVT (paroxysmal supraventricular tachycardia) (HCC), Sleep apnea, Tracheobronchomalacia, and Type II diabetes mellitus (HCC).   Surgical History:   Past Surgical History:  Procedure Laterality Date   ACROMIO-CLAVICULAR JOINT REPAIR Left 05/05/2019   Procedure: SHOULDER ARTHROSCOPIC ASSISTED  ACROMIO-CLAVICULAR JOINT REPAIR;  Surgeon: Dozier Soulier, MD;  Location: [facility];  Service: Orthopedics;  Laterality: Left;   CATARACT EXTRACTION Bilateral    CORONARY ARTERY BYPASS GRAFT     [place]    LEFT HEART CATH AND CORS/GRAFTS ANGIOGRAPHY N/A 09/19/2021   Procedure: LEFT HEART CATH AND CORS/GRAFTS ANGIOGRAPHY;  Surgeon: Claudene Victory ORN, MD;  Location: [facility];  Service: Cardiovascular;  Laterality: N/A;   open heart surgery  09/09/1995-1998   5 bypass   REPAIR KNEE LIGAMENT     right   SHOULDER ARTHROSCOPY Left 05/05/2019   Procedure: ARTHROSCOPY SHOULDER;  Surgeon: Dozier Soulier, MD;  Location: [facility];  Service: Orthopedics;  Laterality: Left;   TONSILLECTOMY     TOTAL KNEE ARTHROPLASTY Left 05/29/2020   Procedure: LEFT TOTAL KNEE ARTHROPLASTY;  Surgeon: Sheril Coy, MD;  Location: [facility];  Service: Orthopedics;  Laterality: Left;     Social History:   reports that he quit smoking about 18 years ago. His smoking use included cigars and cigarettes. He started smoking about 21 years ago. He has a 3 pack-year smoking history. He has never used smokeless tobacco. He reports current alcohol  use of about 1.0 standard drink of alcohol  per week. He reports that he does not use drugs.   Family History:  His family history includes Heart attack (age of onset: 40) in his son; Heart disease in his father and mother; Stroke in his father. There is no history of Kidney disease, Prostate cancer, or Diabetes.   Allergies Allergies  Allergen Reactions   Oxycodone  Anaphylaxis    02/25/2022      Home Medications  Prior to Admission medications   Medication Sig Start Date End Date Taking? Authorizing Provider  acetaminophen  (TYLENOL ) 500 MG tablet Take 1,000  mg by mouth every 8 (eight) hours as needed.   Yes [provider]  bisoprolol  (ZEBETA ) 5 MG tablet Take 0.5 tablets (2.5 mg total) by mouth daily. 08/29/23  Yes Darci Pore, MD  Calcium   Carbonate-Vit D-Min (CALCIUM -VITAMIN D -MINERALS) 600-800 MG-UNIT CHEW Chew 1 tablet by mouth daily.   Yes [provider]  cyanocobalamin  (VITAMIN B12) 500 MCG tablet Take 500 mcg by mouth daily.   Yes [provider]  ezetimibe  (ZETIA ) 10 MG tablet TAKE 1 TABLET BY MOUTH EVERY DAY 07/08/23  Yes Tower, Laine LABOR, MD  famotidine  (PEPCID ) 20 MG tablet TAKE 1 TABLET BY MOUTH EVERYDAY AT BEDTIME 04/10/23  Yes Darlean Ozell NOVAK, MD  gabapentin  (NEURONTIN ) 100 MG capsule Take 3 capsules (300 mg total) by mouth at bedtime. Reduced from 300 mg. Patient taking differently: Take 100 mg by mouth daily. 09/14/23  Yes Beamer, Richerd, MD  gabapentin  (NEURONTIN ) 300 MG capsule Take 300 mg by mouth at bedtime.   Yes [provider]  Glucosamine HCl (GLUCOSAMINE PO) Take 1,500 mg by mouth 2 (two) times daily.    Yes [provider]  HYDROcodone -acetaminophen  (NORCO/VICODIN) 5-325 MG tablet Take 1 tablet by mouth every 6 (six) hours as needed for moderate pain (pain score 4-6) or severe pain (pain score 7-10). 09/10/23  Yes Caro Harlene POUR, NP  insulin  isophane & regular human KwikPen (NOVOLIN  70/30 KWIKPEN) (70-30) 100 UNIT/ML KwikPen Inject 30 Units into the skin daily with breakfast. And pen needles 1/day 05/08/23  Yes Trixie File, MD  insulin  lispro (HUMALOG  KWIKPEN) 200 UNIT/ML KwikPen Inject 10 Units into the skin 2 (two) times daily before a meal. Inject before lunch and dinner. 09/06/23  Yes Awanda City, MD  ketoconazole (NIZORAL) 2 % cream Apply 1 Application topically 2 (two) times daily. 11/10/22  Yes [provider]  linezolid  (ZYVOX ) 600 MG tablet Take 600 mg by mouth 2 (two) times daily.   Yes [provider]  Menthol, Topical Analgesic, (BIOFREEZE COOL THE PAIN) 4 % GEL Apply to lower back topically four times daily   Yes [provider]  Multiple Vitamins-Minerals (PRESERVISION AREDS 2 PO) Take 1 capsule by mouth daily.   Yes [provider]  nitroGLYCERIN  (NITROSTAT ) 0.4 MG SL tablet Place 1 tablet (0.4 mg total) under the tongue every 5 (five) minutes x 3 doses as needed for chest pain. 01/19/23  Yes Gollan, Timothy J, MD  omeprazole  (PRILOSEC) 40 MG capsule TAKE 1 CAPUSLE BY MOUTH 30- 60 MIN BEFORE YOUR FIRST AND LAST MEALS OF THE DAY 10/31/22  Yes Darlean Ozell NOVAK, MD  ondansetron  (ZOFRAN ) 8 MG tablet Take 1 tablet (8 mg total) by mouth every 8 (eight) hours as needed for nausea or vomiting. From narcotic pain medicine 01/21/23  Yes Tower, Laine LABOR, MD  polyethylene glycol (MIRALAX  / GLYCOLAX ) 17 g packet Take 17 g by mouth daily.   Yes [provider]  predniSONE  (DELTASONE ) 20 MG tablet Starting on 09/07/23, take 30 mg prednisone  daily for 7 days, and decrease by 10mg  every 7 days until 10mg  and maintain until seen as outpatient by pulmonologist. Patient taking differently: Take 10 mg by mouth daily. Give 20mg  by mouth once daily for 6 days, then reduce to 10mg  daily. 09/06/23  Yes Awanda City, MD  rosuvastatin  (CRESTOR ) 40 MG tablet TAKE 1 TABLET BY MOUTH EVERY DAY 11/18/22  Yes Tower, Marne A, MD  Semaglutide , 2 MG/DOSE, (OZEMPIC , 2 MG/DOSE,) 8 MG/3ML SOPN Inject 2 mg into the skin  once a week. 05/08/23  Yes Trixie File, MD  senna-docusate (SENOKOT-S) 8.6-50 MG tablet Take 1 tablet by mouth at bedtime. 08/28/23  Yes Darci Pore, MD  aspirin  EC (ASPIRIN  81) 81 MG tablet Take 81 mg by mouth daily. Swallow whole.    [provider]  Continuous Blood Gluc Receiver (FREESTYLE LIBRE 2 READER) DEVI USE AS INSTRUCTED TO CHECK BLOOD SUGARS. 01/13/22   Kassie Mallick, MD  Continuous Blood Gluc Sensor (FREESTYLE LIBRE 2 SENSOR) MISC 1 Device by Does not apply route every 14 (fourteen) days. 10/31/22   Trixie File, MD  glucose blood (FREESTYLE LITE) test strip 1 each by Other route 4 (four) times daily. E11.65 04/10/20   Kassie Mallick, MD  Insulin  Pen Needle (BD PEN NEEDLE NANO 2ND GEN) 32G X 4 MM MISC  USE 3 TIMES A DAY 01/25/23   Trixie File, MD  Lancets (FREESTYLE) lancets 1 EACH BY OTHER ROUTE 4 (FOUR) TIMES DAILY. E11.65 08/23/20   Kassie Mallick, MD  OXYGEN 2lpm every shift related to acute respiratory failure with hypoxia.    [provider]     Critical care time: 50 minutes    Belva November, MD [facility] 09/19/2023 11:14 AM

## 2023-09-19 NOTE — Consult Note (Signed)
 Consultation Note Date: 09/19/2023   Patient Name: ZAYLEN SUSMAN  DOB: 03/13/1946  MRN: [id number]  Age / Sex: 78 y.o., male  PCP: Tower, Laine LABOR, MD Referring Physician: Isadora Hose, MD  Reason for Consultation: Establishing goals of care   HPI/Brief Hospital Course: 78 y.o. male  with past medical history of CAD s/p CABG, moderate mitral valve regurgitation, severe bronchomalacia, CKD stage IV, T2DM, HTN, HLD, OSA on CPAP admitted from [place] on 09/17/2023 with AMS. Had recent visit with PCP within [facility], found to have significant AKI superimposed on CKD. Noted 2 recent admissions in December for acute respiratory failure secondary to PNA with MRSE, rhinovirus and tracheobronchomalacia.  Admitted to ICU and being treated for multifactorial hypotension, septic shock, AKI on CKD with hyperkalemia, lactic and metabolic acidosis, macrocytic anemia and thrombocytopenia  1/10: CRRT started due to worsening renal function, required initiation of vasopressor support, lactic acidosis worsening  Palliative medicine was consulted for assisting with goals of care conversations and supporting family through comfort care transition.  Subjective:  Extensive chart review has been completed prior to meeting patient including labs, vital signs, imaging, progress notes, orders, and available advanced directive documents from current and previous encounters.  Collaborated with ICU team who previously met with family. Family has verbalized their desire to transition to comfort care, possibly waiting on a few more family members to visit from out of town prior to transitioning. Support requested for family during comfort transition.  Visited with Ms. Bi at his bedside. He is resting in bed with eyes closed, unable to participate in goals of care conversations. Wife, daughter and grandson at bedside during time of visit.  Introduced myself as a scientist, water quality as a member of the palliative care team. Explained palliative medicine is specialized medical care for people living with serious illness. It focuses on providing relief from the symptoms and stress of a serious illness. The goal is to improve quality of life for both the patient and the family.   Family confirms their desire to transition to comfort care, they are anticipating arrival of a few more family members but also want to focus on his comfort while waiting. At this time, Mr. Cottrill without signs of distress or discomfort. Discussed initiating as needed pain medications for which they agreed.  Consulted with pharmacy as it is documented Mr. Mayberry history of anaphylactic reaction to codeine  in the past. Decision was made to utilize Fentanyl  as needed for pain as he has tolerated in the past.  Per chart review and conversations later in shift with nursing staff, ICU team again met with family and transitioned to full comfort measures. No acute needs at this time per nursing staff.  Emotional support provided to patient/family/support persons. PMT will continue to follow and support patient as needed.  Objective: Primary Diagnoses: Present on Admission:  Renal failure (ARF), acute on chronic (HCC)   Assessment and Plan  SUMMARY OF RECOMMENDATIONS   Full Comfort Measures PMT to continue to follow for ongoing needs and support  Palliative Prophylaxis:   Bowel Regimen, Delirium Protocol and Frequent Pain Assessment  Spiritual:  Desire for ongoing Chaplain support: Yes, have visited previously and family aware to request chaplain support to be paged as desired Education provided on Chaplain services offered through PMT, education provided on Grief/Bereavement support services    Discussed With: ICU team and nursing staff   Thank you for this consult and allowing Palliative Medicine to participate in the care of  Ubaldo CHARM Seidel. Palliative medicine will continue  to follow and assist as needed.   Time Total: 55 minutes  Time spent includes: Detailed review of medical records (labs, imaging, vital signs), medically appropriate exam (mental status, respiratory, cardiac, skin), discussed with treatment team, counseling and educating patient, family and staff, documenting clinical information, medication management and coordination of care.   Signed by: Waddell Lesches, DNP, AGNP-C Palliative Medicine    Please contact Palliative Medicine Team phone at [phone] for questions and concerns.  For individual provider: See Tracey

## 2023-09-20 DIAGNOSIS — R579 Shock, unspecified: Secondary | ICD-10-CM | POA: Diagnosis not present

## 2023-09-20 DIAGNOSIS — J398 Other specified diseases of upper respiratory tract: Secondary | ICD-10-CM

## 2023-09-20 DIAGNOSIS — E872 Acidosis, unspecified: Secondary | ICD-10-CM

## 2023-09-20 DIAGNOSIS — N179 Acute kidney failure, unspecified: Secondary | ICD-10-CM | POA: Diagnosis not present

## 2023-09-20 LAB — HAPTOGLOBIN: Haptoglobin: 22 mg/dL — ABNORMAL LOW (ref 34–355)

## 2023-09-21 LAB — BLOOD GAS, VENOUS
Acid-base deficit: 13.4 mmol/L — ABNORMAL HIGH (ref 0.0–2.0)
Acid-base deficit: 11.9 mmol/L — ABNORMAL HIGH (ref 0.0–2.0)
Bicarbonate: 14.2 mmol/L — ABNORMAL LOW (ref 20.0–28.0)
Bicarbonate: 14.7 mmol/L — ABNORMAL LOW (ref 20.0–28.0)
O2 Saturation: 23.6 %
O2 Saturation: 28 %
Patient temperature: 37
Patient temperature: 37
pCO2, Ven: 38 mm[Hg] — ABNORMAL LOW (ref 44–60)
pCO2, Ven: 35 mm[Hg] — ABNORMAL LOW (ref 44–60)
pH, Ven: 7.18 — CL (ref 7.25–7.43)
pH, Ven: 7.23 — ABNORMAL LOW (ref 7.25–7.43)

## 2023-09-21 LAB — CRYPTOCOCCUS ANTIGEN, SERUM: Cryptococcus Antigen, Serum: NEGATIVE

## 2023-09-21 LAB — ADAMTS13 ACTIVITY REFLEX

## 2023-09-21 LAB — ASPERGILLUS ANTIGEN, BAL/SERUM: Aspergillus Ag, BAL/Serum: 0.1 {index} (ref 0.00–0.49)

## 2023-09-21 LAB — ADAMTS13 ACTIVITY: Adamts 13 Activity: 50.1 % — ABNORMAL LOW (ref >66.8)

## 2023-09-22 LAB — CULTURE, BLOOD (ROUTINE X 2)
Culture: NO GROWTH
Culture: NO GROWTH
Special Requests: ADEQUATE

## 2023-09-23 LAB — FUNGITELL BETA-D-GLUCAN
Fungitell Value:: <31.25 pg/mL
Result Name:: NEGATIVE

## 2023-09-30 ENCOUNTER — Inpatient Hospital Stay: Payer: PPO | Admitting: Student in an Organized Health Care Education/Training Program

## 2023-10-10 NOTE — Progress Notes (Signed)
  Chaplain On-Call responded to a page from the ICU Unit Secretary Merlynn, who reported that the patient's family is at the bedside, and that the patient is placed on comfort care.  The patient is non-responsive. Chaplain met patient's daughters Kurt Aguilar and Kurt Aguilar. They described the suddenness of the patient's illness, and their confidence that the patient is assured through his faith.  Chaplain provided prayer and spiritual and emotional support, and assured them of further availability as needed.  Chaplain Bebe Lay M.Div., [facility]

## 2023-10-10 NOTE — Plan of Care (Signed)
  Problem: Pain Management: Goal: General experience of comfort will improve Outcome: Progressing

## 2023-10-10 NOTE — Death Summary Note (Signed)
 DEATH SUMMARY   Patient Details  Name: Kurt Aguilar MRN: [id number] DOB: [birth year] ERE:Untzm, Laine LABOR, MD Admission/Discharge Information   Admit Date:  10-12-2023  Date of Death: Date of Death: 15-Oct-2023  Time of Death: Time of Death: 0937  Length of Stay: 3   Principle Cause of death: Hypovolemic Shock  Hospital Diagnoses: Principal Problem:   Acute kidney injury superimposed on chronic kidney disease (HCC) Active Problems:   Tracheobronchomalacia   Type 2 diabetes mellitus (HCC)   Shock circulatory (HCC)   Thrombocytopenia (HCC)   Pressure injury of skin   Macrocytic anemia   Metabolic acidosis   Hospital Course: Kurt Aguilar is a 78 yo male with a PMH significant for CAD s/p CABG in 09/08/1996, moderate mitral valve stenosis, severe tracheobronchomalacia, CKD stage IV, type 2 diabetes mellitus, HTN, HLD, and OSA on CPAP presented to [facility] ER on 10-12-23 via EMS from [facility] with altered mental status. Wife at bedside reported the patient has had poor po intake over the past 2 days and an inability to urinate. He also has had back spasms along with twitching movements. Per outpatient facility documentation pts recent blood work results were abnormal due to creatinine increasing from 3.35 to 5.35.   Initial labs - K+ 6.1/CO2 13/glucose 138/BUN 155/creatinine 7.03/calcium  7.3/anion gap 21/albumin  3.1/AST 66/ALT 61/BNP 320.1/troponin 1,451/lactic acid 6.3/pct 0.64/hgb 7.9/platelet count 66. EKG revealed sinus rhythm with RBBB which is not a new finding, but no ST elevation. CXR and Resp panel by RT-PCR negative. CT Abd Pelvis negative for acute intra-abdominal or pelvic abnormality, although concerning for multifocal pneumonia. Pt received 100 mg of iv solucortef, 10 units of iv insulin , 1 amp of D50W, 10 g of lokelma , 2L LR bolus, and heparin  bolus followed by heparin  gtt. Despite aggressive iv fluid resuscitation bp readings remained soft and PCCM team contacted for ICU admission.    10/12/23: Admitted to ICU with hypotension multifactorial due to possible adrenal insufficiency/septic shock, and hypovolemia, acute kidney injury superimposed on CKD stage IV with hyperkalemia, lactic and metabolic acidosis, elevated troponin secondary to demand ischemia vs. NSTEMI, macrocytic anemia, and thrombocytopenia     09/18/23:  Started on CRRT overnight due to worsening AKI and metabolic acidosis.  Requiring Levophed  with escalating requirements, add Vasopressin . Remains Encephalopathic.  Transfused 1 unit platelets for HD catheter insertion, given 1 unit pRBCs for Hgb of 6.7 with appropriate response in Hgb to 7.6.  Cardiology plans to repeat Echo. Consult Palliative Care for GOC discussions. 09/19/23: disoriented overnight, palliative discussed with family who would like to transition care to comfort measures only. 2023/10/15 patient passed away at 9:37 am.   Nutrition Documentation    Flowsheet Row ED to Hosp-Admission (Current) from 2023-10-12 in [facility] ICU/CCU  Nutrition Problem Inadequate oral intake  Etiology acute illness  Nutrition Goal Patient will meet greater than or equal to 90% of their needs     ,  Active Pressure Injury/Wound(s)     Pressure Ulcer  Duration          Pressure Injury 10-12-2023 Sacrum Mid Stage 1 -  Intact skin with non-blanchable redness of a localized area usually over a bony prominence. 2 days           Procedures: none  Consultations: Hematology, Cardiology, Palliative  The results of significant diagnostics from this hospitalization (including imaging, microbiology, ancillary and laboratory) are listed below for reference.   Significant Diagnostic Studies: ECHOCARDIOGRAM LIMITED Result Date: 09/18/2023  ECHOCARDIOGRAM LIMITED REPORT   Patient Name:   Kurt Aguilar [facility] Date of Exam: 09/18/2023 Medical Rec #:  [id number]        Height:       70.0 in Accession #:    [id number]       Weight:       242.9 lb Date of Birth:   1945/12/18        BSA:          2.267 m Patient Age:    77 years         BP:           101/67 mmHg Patient Gender: M                HR:           75 bpm. Exam Location:  [facility] Procedure: Limited Echo Indications:     NSTEMI  History:         Patient has prior history of Echocardiogram examinations, most                  recent 08/31/2023. CHF, Acute MI, Previous Myocardial                  Infarction and CAD, Arrythmias:RBBB; Risk Factors:Hypertension,                  Diabetes, Dyslipidemia and Sleep Apnea. CKD.  Sonographer:     Naomie Reef Referring Phys:  [id number] CHRISTOPHER RONALD BERGE Diagnosing Phys: Lonni Hanson MD  Sonographer Comments: Technically difficult study due to poor echo windows. IMPRESSIONS  1. Left ventricular ejection fraction, by estimation, is >55%. The left ventricle has normal function. Left ventricular endocardial border not optimally defined to evaluate regional wall motion. There is moderate left ventricular hypertrophy.  2. Right ventricular systolic function was not well visualized. The right ventricular size is not well visualized.  3. The mitral valve was not well visualized. Severe mitral annular calcification.  4. The aortic valve has an indeterminant number of cusps. There is moderate calcification of the aortic valve. There is moderate thickening of the aortic valve. FINDINGS  Left Ventricle: Left ventricular ejection fraction, by estimation, is >55%. The left ventricle has normal function. Left ventricular endocardial border not optimally defined to evaluate regional wall motion. The left ventricular internal cavity size was  normal in size. There is moderate left ventricular hypertrophy. Right Ventricle: The right ventricular size is not well visualized. No increase in right ventricular wall thickness. Right ventricular systolic function was not well visualized. Pericardium: There is no evidence of pericardial effusion. Mitral Valve: The mitral valve was not well  visualized. Severe mitral annular calcification. Aortic Valve: The aortic valve has an indeterminant number of cusps. There is moderate calcification of the aortic valve. There is moderate thickening of the aortic valve. Aorta: The aortic root is normal in size and structure. LEFT VENTRICLE PLAX 2D LVIDd:         4.80 cm LVIDs:         3.10 cm LV PW:         1.20 cm LV IVS:        1.50 cm LVOT diam:     2.00 cm LVOT Area:     3.14 cm  LEFT ATRIUM         Index LA diam:    4.80 cm 2.12 cm/m   AORTA Ao Root diam: 3.60 cm  SHUNTS Systemic Diam: 2.00 cm Christopher End  MD Electronically signed by Lonni Hanson MD Signature Date/Time: 09/18/2023/4:43:48 PM    Final    US  Venous Img Lower Bilateral (DVT) Result Date: 09/17/2023 CLINICAL DATA:  Bilateral leg swelling. EXAM: BILATERAL LOWER EXTREMITY VENOUS DOPPLER ULTRASOUND TECHNIQUE: Gray-scale sonography with graded compression, as well as color Doppler and duplex ultrasound were performed to evaluate the lower extremity deep venous systems from the level of the common femoral vein and including the common femoral, femoral, profunda femoral, popliteal and calf veins including the posterior tibial, peroneal and gastrocnemius veins when visible. The superficial great saphenous vein was also interrogated. Spectral Doppler was utilized to evaluate flow at rest and with distal augmentation maneuvers in the common femoral, femoral and popliteal veins. COMPARISON:  None Available. FINDINGS: RIGHT LOWER EXTREMITY Common Femoral Vein: No evidence of thrombus. Normal compressibility, respiratory phasicity and response to augmentation. Saphenofemoral Junction: No evidence of thrombus. Normal compressibility and flow on color Doppler imaging. Profunda Femoral Vein: No evidence of thrombus. Normal compressibility and flow on color Doppler imaging. Femoral Vein: No evidence of thrombus. Normal compressibility, respiratory phasicity and response to augmentation. Popliteal Vein:  No evidence of thrombus. Normal compressibility, respiratory phasicity and response to augmentation. Calf Veins: No evidence of thrombus. Normal compressibility and flow on color Doppler imaging. Superficial Great Saphenous Vein: No evidence of thrombus. Normal compressibility. Venous Reflux:  None. Other Findings: Complex fluid collection along the right calf measuring 8.8 x 2.9 x 4.3 cm. No abnormal blood flow on Doppler. Few internal echoes. Lobular margins. LEFT LOWER EXTREMITY Common Femoral Vein: No evidence of thrombus. Normal compressibility, respiratory phasicity and response to augmentation. Saphenofemoral Junction: No evidence of thrombus. Normal compressibility and flow on color Doppler imaging. Profunda Femoral Vein: No evidence of thrombus. Normal compressibility and flow on color Doppler imaging. Femoral Vein: No evidence of thrombus. Normal compressibility, respiratory phasicity and response to augmentation. Popliteal Vein: No evidence of thrombus. Normal compressibility, respiratory phasicity and response to augmentation. Calf Veins: No evidence of thrombus. Normal compressibility and flow on color Doppler imaging. Superficial Great Saphenous Vein: No evidence of thrombus. Normal compressibility. Venous Reflux:  None. Other Findings:  None. IMPRESSION: No evidence of deep venous thrombosis in either lower extremity. 8.8 x 2.9 x 4.3 cm complex fluid collection along the right calf region. This has has a differential including resolving hematoma, infection or other lesion. Please correlate with specific history and further workup/follow up is recommended. Electronically Signed   By: Ranell Bring M.D.   On: 09/17/2023 18:30   CT ABDOMEN PELVIS WO CONTRAST Result Date: 09/17/2023 CLINICAL DATA:  Abdomen pain EXAM: CT ABDOMEN AND PELVIS WITHOUT CONTRAST TECHNIQUE: Multidetector CT imaging of the abdomen and pelvis was performed following the standard protocol without IV contrast. RADIATION DOSE  REDUCTION: This exam was performed according to the departmental dose-optimization program which includes automated exposure control, adjustment of the mA and/or kV according to patient size and/or use of iterative reconstruction technique. COMPARISON:  CT 08/17/2022 FINDINGS: Lower chest: Lung bases demonstrate mild bronchiectasis in the lower lobes. Small foci of lower lobe consolidation and peribronchial thickening. Small focus of right middle lobe consolidation. Coronary vascular calcification. Aortic valve calcifications. Borderline to mild cardiomegaly. Hepatobiliary: No calcified gallstone. No biliary dilatation. Hepatic granuloma. Pancreas: Unremarkable. No pancreatic ductal dilatation or surrounding inflammatory changes. Spleen: Numerous calcified granuloma Adrenals/Urinary Tract: Adrenal glands are normal. Polycystic kidneys bilaterally with numerous simple and complex cysts and subcentimeter hyper and hypodensities too small to further characterize, grossly stable as compared with 2023  and further assessment limited without contrast no hydronephrosis. Decompressed urinary bladder by Foley catheter Stomach/Bowel: Moderate fluid distension of stomach. No evidence for bowel obstruction or bowel wall thickening. Negative appendix. Vascular/Lymphatic: Moderate aortic atherosclerosis. No aneurysm. No suspicious lymph nodes Reproductive: Negative prostate Other: Negative for pelvic effusion or free air. Small fat containing inguinal hernias. Musculoskeletal: No acute or suspicious osseous abnormality. IMPRESSION: 1. No CT evidence for acute intra-abdominal or pelvic abnormality. 2. Mild lower lobe bronchiectasis. Peribronchial thickening with patchy foci of consolidations in the lower lobes and right middle lobe, possible multifocal pneumonia, imaging follow-up to resolution is recommended. 3. Polycystic kidneys. Numerous simple and complex bilateral renal cysts, grossly unchanged, further evaluation limited  without contrast. Non emergent/outpatient renal CT or MRI follow-up contrast examination could be performed if desired. 4. Moderate fluid distension of stomach. 5. Aortic atherosclerosis. Aortic Atherosclerosis (ICD10-I70.0). Electronically Signed   By: Luke Bun M.D.   On: 09/17/2023 16:07   CT HEAD WO CONTRAST ( ) Result Date: 09/17/2023 CLINICAL DATA:  Non focal encephalopathy. Evaluate for intracranial hemorrhage or CVA. Acute onset of confusion and lethargy. EXAM: CT HEAD WITHOUT CONTRAST TECHNIQUE: Contiguous axial images were obtained from the base of the skull through the vertex without intravenous contrast. RADIATION DOSE REDUCTION: This exam was performed according to the departmental dose-optimization program which includes automated exposure control, adjustment of the mA and/or kV according to patient size and/or use of iterative reconstruction technique. COMPARISON:  CT head without contrast 11/17/2021. FINDINGS: Brain: Moderate atrophy and white matter changes demonstrate some progression since the prior exam. No acute infarct, hemorrhage, or mass lesion is present. The ventricles are of normal size. No significant extraaxial fluid collection is present. The brainstem and cerebellum are within normal limits. A relatively empty sella is present. Midline structures are otherwise within normal limits. Vascular: Atherosclerotic calcifications are present within the cavernous internal carotid arteries bilaterally. No hyperdense vessel is present. Skull: Calvarium is intact. No focal lytic or blastic lesions are present. No significant extracranial soft tissue lesion is present. Sinuses/Orbits: Bilateral lens replacements are noted. Globes and orbits are otherwise unremarkable. The paranasal sinuses and mastoid air cells are clear. IMPRESSION: 1. No acute intracranial abnormality or significant interval change. 2. Moderate atrophy and white matter disease demonstrates some progression since the prior  exam. This likely reflects the sequela of chronic microvascular ischemia. Electronically Signed   By: Lonni Necessary M.D.   On: 09/17/2023 14:51   DG Chest Port 1 View Result Date: 09/17/2023 CLINICAL DATA:  Sepsis EXAM: PORTABLE CHEST 1 VIEW COMPARISON:  X-ray 08/30/2023. FINDINGS: Enlarged cardiopericardial silhouette. Sternal wires. Stable widened mediastinum with a calcified aorta. Persistent left pleural effusion but improving. Adjacent mild opacity. Presumed calcification along the left lung apex. No pneumothorax or edema. Overlapping cardiac leads. IMPRESSION: Postop chest.  Enlarged heart. Improving left pleural effusion. Electronically Signed   By: Ranell Bring M.D.   On: 09/17/2023 12:21   US  Venous Img Lower Unilateral Right (DVT) Result Date: 09/05/2023 CLINICAL DATA:  Right leg pain, initial encounter EXAM: RIGHT LOWER EXTREMITY VENOUS DOPPLER ULTRASOUND TECHNIQUE: Gray-scale sonography with graded compression, as well as color Doppler and duplex ultrasound were performed to evaluate the lower extremity deep venous systems from the level of the common femoral vein and including the common femoral, femoral, profunda femoral, popliteal and calf veins including the posterior tibial, peroneal and gastrocnemius veins when visible. The superficial great saphenous vein was also interrogated. Spectral Doppler was utilized to evaluate flow at rest and  with distal augmentation maneuvers in the common femoral, femoral and popliteal veins. COMPARISON:  None Available. FINDINGS: Contralateral Common Femoral Vein: Respiratory phasicity is normal and symmetric with the symptomatic side. No evidence of thrombus. Normal compressibility. Common Femoral Vein: No evidence of thrombus. Normal compressibility, respiratory phasicity and response to augmentation. Saphenofemoral Junction: No evidence of thrombus. Normal compressibility and flow on color Doppler imaging. Profunda Femoral Vein: No evidence of  thrombus. Normal compressibility and flow on color Doppler imaging. Femoral Vein: No evidence of thrombus. Normal compressibility, respiratory phasicity and response to augmentation. Popliteal Vein: No evidence of thrombus. Normal compressibility, respiratory phasicity and response to augmentation. Calf Veins: No evidence of thrombus. Normal compressibility and flow on color Doppler imaging. Superficial Great Saphenous Vein: No evidence of thrombus. Normal compressibility. Venous Reflux:  None. Other Findings:  None. IMPRESSION: No evidence of deep venous thrombosis. Electronically Signed   By: Oneil Devonshire M.D.   On: 09/05/2023 19:01   ECHOCARDIOGRAM COMPLETE Result Date: 08/31/2023    ECHOCARDIOGRAM REPORT   Patient Name:   Kurt Aguilar [facility] Date of Exam: 08/31/2023 Medical Rec #:  [id number]        Height:       70.0 in Accession #:    [id number]       Weight:       258.6 lb Date of Birth:  August 01, 1946        BSA:          2.328 m Patient Age:    77 years         BP:           123/73 mmHg Patient Gender: M                HR:           86 bpm. Exam Location:  [facility] Procedure: 2D Echo, Color Doppler, Cardiac Doppler and Intracardiac            Opacification Agent Indications:     NSTEMI I21.4  History:         Patient has prior history of Echocardiogram examinations, most                  recent 02/05/2023. Previous Myocardial Infarction, Prior CABG;                  Risk Factors:Hypertension and Dyslipidemia.  Sonographer:     Christopher Furnace Referring Phys:  [id number] [place]LAY[place] BROOM Diagnosing Phys: Deatrice Cage MD IMPRESSIONS  1. Left ventricular ejection fraction, by estimation, is 55 to 60%. The left ventricle has normal function. The left ventricle has no regional wall motion abnormalities. There is mild left ventricular hypertrophy. Left ventricular diastolic parameters are indeterminate.  2. Right ventricular systolic function is normal. The right ventricular size is normal. There is normal pulmonary artery  systolic pressure.  3. Left atrial size was moderately dilated.  4. The mitral valve is degenerative. No evidence of mitral valve regurgitation. Mild mitral stenosis. The mean mitral valve gradient is 4.0 mmHg. Severe mitral annular calcification.  5. The aortic valve is calcified. Aortic valve regurgitation is not visualized. Mild to moderate aortic valve stenosis. AV gradient does not seem to be accurate. Stenosis degree is assesed visually. FINDINGS  Left Ventricle: Left ventricular ejection fraction, by estimation, is 55 to 60%. The left ventricle has normal function. The left ventricle has no regional wall motion abnormalities. Definity  contrast agent was given IV to delineate the left ventricular  endocardial  borders. The left ventricular internal cavity size was normal in size. There is mild left ventricular hypertrophy. Left ventricular diastolic parameters are indeterminate. Right Ventricle: The right ventricular size is normal. No increase in right ventricular wall thickness. Right ventricular systolic function is normal. There is normal pulmonary artery systolic pressure. The tricuspid regurgitant velocity is 2.42 m/s, and  with an assumed right atrial pressure of 5 mmHg, the estimated right ventricular systolic pressure is 28.4 mmHg. Left Atrium: Left atrial size was moderately dilated. Right Atrium: Right atrial size was normal in size. Pericardium: There is no evidence of pericardial effusion. Mitral Valve: The mitral valve is degenerative in appearance. Severe mitral annular calcification. No evidence of mitral valve regurgitation. Mild mitral valve stenosis. MV peak gradient, 11.6 mmHg. The mean mitral valve gradient is 4.0 mmHg. Tricuspid Valve: The tricuspid valve is normal in structure. Tricuspid valve regurgitation is mild . No evidence of tricuspid stenosis. Aortic Valve: The aortic valve is calcified. Aortic valve regurgitation is not visualized. Mild to moderate aortic stenosis is present.  Aortic valve mean gradient measures 2.0 mmHg. Aortic valve peak gradient measures 3.0 mmHg. Aortic valve area, by VTI measures 3.47 cm. Pulmonic Valve: The pulmonic valve was normal in structure. Pulmonic valve regurgitation is not visualized. No evidence of pulmonic stenosis. Aorta: The aortic root is normal in size and structure. Venous: The inferior vena cava was not well visualized. IAS/Shunts: No atrial level shunt detected by color flow Doppler.  LEFT VENTRICLE PLAX 2D LVIDd:         4.00 cm   Diastology LVIDs:         3.05 cm   LV e' medial:    5.33 cm/s LV PW:         1.10 cm   LV E/e' medial:  22.1 LV IVS:        1.40 cm   LV e' lateral:   7.29 cm/s LVOT diam:     2.00 cm   LV E/e' lateral: 16.2 LV SV:         53 LV SV Index:   23 LVOT Area:     3.14 cm  RIGHT VENTRICLE RV Basal diam:  3.60 cm RV Mid diam:    3.40 cm RV S prime:     9.90 cm/s TAPSE (M-mode): 1.8 cm LEFT ATRIUM             Index        RIGHT ATRIUM           Index LA diam:        4.90 cm 2.10 cm/m   RA Area:     19.50 cm LA Vol (A2C):   81.6 ml 35.05 ml/m  RA Volume:   49.40 ml  21.22 ml/m LA Vol (A4C):   67.5 ml 28.99 ml/m LA Biplane Vol: 73.7 ml 31.66 ml/m  AORTIC VALVE AV Area (Vmax):    3.04 cm AV Area (Vmean):   2.80 cm AV Area (VTI):     3.47 cm AV Vmax:           86.50 cm/s AV Vmean:          63.500 cm/s AV VTI:            0.152 m AV Peak Grad:      3.0 mmHg AV Mean Grad:      2.0 mmHg LVOT Vmax:         83.80 cm/s LVOT Vmean:  56.500 cm/s LVOT VTI:          0.168 m LVOT/AV VTI ratio: 1.11  AORTA Ao Root diam: 3.30 cm MITRAL VALVE                TRICUSPID VALVE MV Area (PHT): 2.68 cm     TR Peak grad:   23.4 mmHg MV Area VTI:   1.38 cm     TR Vmax:        242.00 cm/s MV Peak grad:  11.6 mmHg MV Mean grad:  4.0 mmHg     SHUNTS MV Vmax:       1.70 m/s     Systemic VTI:  0.17 m MV Vmean:      98.8 cm/s    Systemic Diam: 2.00 cm MV Decel Time: 283 msec MV E velocity: 118.00 cm/s MV A velocity: 140.00 cm/s MV E/A ratio:   0.84 Deatrice Cage MD Electronically signed by Deatrice Cage MD Signature Date/Time: 08/31/2023/3:20:37 PM    Final    NM Pulmonary Perfusion Result Date: 08/31/2023 CLINICAL DATA:  Worsening shortness of breath. EXAM: NUCLEAR MEDICINE PERFUSION LUNG SCAN TECHNIQUE: Perfusion images were obtained in multiple projections after intravenous injection of radiopharmaceutical. Ventilation scans intentionally deferred if perfusion scan and chest x-ray adequate for interpretation during COVID 19 epidemic. RADIOPHARMACEUTICALS:  4.28 mCi Tc-47m MAA IV COMPARISON:  Chest CT and radiograph 08/30/2023 FINDINGS: Comparison chest radiograph demonstrates airspace opacities in the left lower lobe. Perfusion imaging demonstrates no segmental or subsegmental perfusion abnormalities. Perfusion defect in the left lower lobe corresponds to airspace opacity seen on prior imaging. IMPRESSION: Nonsegmental perfusion defect correlating to airspace opacities seen on prior imaging. Low probability of pulmonary embolism. Electronically Signed   By: Norman Gatlin M.D.   On: 08/31/2023 10:42   US  Abdomen Complete Result Date: 08/30/2023 CLINICAL DATA:  Acute kidney injury. Elevated liver function studies. EXAM: ABDOMEN ULTRASOUND COMPLETE COMPARISON:  CT abdomen and pelvis 08/17/2022 FINDINGS: Gallbladder: No gallstones or wall thickening visualized. No sonographic Murphy sign noted by sonographer. Common bile duct: Diameter: 3 mm, normal Liver: Diffusely increased hepatic parenchymal echotexture suggesting fatty infiltration. Subcentimeter cysts. Portal vein is patent on color Doppler imaging with normal direction of blood flow towards the liver. IVC: No abnormality visualized. Pancreas: Visualized portion unremarkable. Spleen: Size and appearance within normal limits. Right Kidney: Length: 15.3 cm. Multiple parenchymal cysts are demonstrated with benign appearance. Largest measures 6.1 cm maximal diameter. Changes are consistent  with polycystic kidney. No hydronephrosis. No solid mass identified. No imaging follow-up is indicated. Left Kidney: Length: 12.5 cm. Multiple parenchymal cysts are demonstrated with benign appearance. Largest measures 4.8 cm in maximal diameter. Changes are consistent with polycystic kidney. No hydronephrosis or solid mass identified. No follow-up imaging is indicated. Abdominal aorta: No aneurysm visualized. Other findings: Examination is somewhat technically limited due to body habitus, bowel gas, and limited mobility. IMPRESSION: 1. Diffuse fatty infiltration of the liver. 2. Polycystic kidneys without evidence of obstruction. Electronically Signed   By: Elsie Gravely M.D.   On: 08/30/2023 20:25   CT CHEST WO CONTRAST Result Date: 08/30/2023 CLINICAL DATA:  Recent history of pneumonia/sepsis. Productive cough. EXAM: CT CHEST WITHOUT CONTRAST TECHNIQUE: Multidetector CT imaging of the chest was performed following the standard protocol without IV contrast. RADIATION DOSE REDUCTION: This exam was performed according to the departmental dose-optimization program which includes automated exposure control, adjustment of the mA and/or kV according to patient size and/or use of iterative reconstruction technique. COMPARISON:  CT  chest dated August 22, 2023. FINDINGS: Cardiovascular: Normal heart size. No pericardial effusion. Atherosclerosis of the thoracic aorta and arch branch vessels. Multivessel coronary artery calcifications. Aortic valve and mitral annulus calcifications. Prior CABG. Mediastinum/Nodes: Redemonstration of multiple prominent mediastinal lymph nodes, similar to slightly decreased in size compared to the prior exam. Calcified mediastinal and right hilar granulomatous nodes. Secretions noted in the left mainstem bronchus. Thyroid  gland and esophagus demonstrate no significant abnormality. Lungs/Pleura: Similar small right and trace left pleural effusions. Calcified pleural plaques in the left  hemithorax. Patchy bilateral multifocal peripheral predominant consolidation is again noted with increased consolidative changes at the right lung base and the superior aspect of the posterior left lower lobe. No pneumothorax. Upper Abdomen: No acute abnormality. Musculoskeletal: Intact median sternotomy wires. No suspicious osseous lesion. No acute osseous abnormality. IMPRESSION: 1. Multilobar pneumonia with increased airspace consolidation at the right lung base and the posterior left lower lobe. 2. Similar small right and trace left pleural effusions. 3. Additional unchanged ancillary findings, as described above. Aortic Atherosclerosis (ICD10-I70.0). Electronically Signed   By: Harrietta Sherry M.D.   On: 08/30/2023 17:03   DG Chest 2 View Result Date: 08/30/2023 CLINICAL DATA:  Cough and dyspnea. Recent hospitalization for pneumonia/sepsis. EXAM: CHEST - 2 VIEW COMPARISON:  08/22/2023 FINDINGS: Lungs are hypoinflated demonstrate persistent opacification over the left base likely infection with associated small left pleural effusion. Possible small amount right pleural fluid. Calcified pleural plaques over the lateral left upper lung/apex. Mild stable cardiomegaly. Remainder of the exam is unchanged. IMPRESSION: 1. Persistent opacification over the left base likely infection with associated small left pleural effusion. Possible small amount right pleural fluid. 2. Stable cardiomegaly. Electronically Signed   By: Toribio Agreste M.D.   On: 08/30/2023 13:43   CT Chest High Resolution Result Date: 08/23/2023 CLINICAL DATA:  78 year old male with history of interstitial lung disease. Shortness of breath. EXAM: CT CHEST WITHOUT CONTRAST TECHNIQUE: Multidetector CT imaging of the chest was performed following the standard protocol without intravenous contrast. High resolution imaging of the lungs, as well as inspiratory and expiratory imaging, was performed. RADIATION DOSE REDUCTION: This exam was performed  according to the departmental dose-optimization program which includes automated exposure control, adjustment of the mA and/or kV according to patient size and/or use of iterative reconstruction technique. COMPARISON:  Chest CTA 12/19/2015. FINDINGS: Cardiovascular: Heart size is normal. There is no significant pericardial fluid, thickening or pericardial calcification. There is aortic atherosclerosis, as well as atherosclerosis of the great vessels of the mediastinum and the coronary arteries, including calcified atherosclerotic plaque in the left main, left anterior descending, left circumflex and right coronary arteries. Status post median sternotomy for CABG including LIMA to the LAD. Severe calcifications of the aortic valve and mitral annulus. Mediastinum/Nodes: Multiple prominent borderline enlarged mediastinal and hilar lymph nodes are incidentally noted. No definite pathologically enlarged lymph nodes are identified. Densely calcified subcarinal and right hilar lymph nodes are also incidentally noted. Esophagus is unremarkable in appearance. No axillary lymphadenopathy. Lungs/Pleura: Small right and trace left pleural effusions lying dependently. Calcified pleural plaques in the left hemithorax. No calcified pleural plaques in the right hemithorax. Patchy multifocal peripheral predominant airspace consolidation throughout the lungs bilaterally, most evident in the dependent portions of the right lower lobe and right upper lobe. High-resolution images also demonstrates some patchy areas of ground-glass attenuation, septal thickening and thickening of the peribronchovascular interstitium, however, today's study is overall limited by motion and the presence of what appears to be acute airspace  consolidation, limiting accurate assessment for underlying interstitial lung disease. Inspiratory and expiratory imaging demonstrates severe collapse of the trachea and mainstem bronchi during expiration indicative of  tracheobronchomalacia. Upper Abdomen: Partially imaged low-attenuation lesion in the upper left retroperitoneum measuring at least 3 cm in diameter, incompletely characterized on today's noncontrast CT examination, but statistically likely an exophytic cyst in the upper pole of the left kidney (no imaging follow-up recommended). Diffuse low attenuation throughout the visualized hepatic parenchyma, indicative of a background of hepatic steatosis. Musculoskeletal: Median sternotomy wires. There are no aggressive appearing lytic or blastic lesions noted in the visualized portions of the skeleton. IMPRESSION: 1. Limited examination for assessment of interstitial lung disease based on presence of what appears to be multilobar pneumonia, most severe in the right lower lobe. If there is persistent clinical concern for interstitial lung disease (which is likely present in the background on today's study), repeat high-resolution chest CT should be performed after resolution of the patient's acute illness on an outpatient basis. 2. Small right and trace left pleural effusions lying dependently. 3. Calcified pleural plaques in the left hemithorax suggesting prior left-sided empyema and/or left-sided thoracic trauma. No right-sided calcified pleural plaques are noted to indicate asbestos related pleural disease at this time. 4. Severe tracheobronchomalacia. 5. Aortic atherosclerosis, in addition to left main and three-vessel coronary artery disease. Status post median sternotomy for CABG including LIMA to the LAD. 6. There are calcifications of the aortic valve and mitral annulus. Echocardiographic correlation for evaluation of potential valvular dysfunction may be warranted if clinically indicated. 7. Additional incidental findings, as above. Aortic Atherosclerosis (ICD10-I70.0). Electronically Signed   By: Toribio Aye M.D.   On: 08/23/2023 06:23   DG Chest 1 View Result Date: 08/22/2023 CLINICAL DATA:  Tachypnea  EXAM: CHEST  1 VIEW COMPARISON:  Chest x-ray 08/20/2023 FINDINGS: Cardiomediastinal silhouette is enlarged, unchanged. There is a stable small left pleural effusion with patchy left basilar opacities. Left-sided calcified pleural plaques are again noted. There is no pneumothorax or acute fracture. Sternotomy wires are present. IMPRESSION: 1. Stable small left pleural effusion with patchy left basilar opacities. 2. Left-sided calcified pleural plaques. Electronically Signed   By: Greig Pique M.D.   On: 08/22/2023 19:08    Microbiology: Recent Results (from the past 240 hours)  Blood Culture (routine x 2)     Status: None (Preliminary result)   Collection Time: 09/17/23 12:18 PM   Specimen: BLOOD  Result Value Ref Range Status   Specimen Description BLOOD RIGHT ANTECUBITAL  Final   Special Requests   Final    BOTTLES DRAWN AEROBIC AND ANAEROBIC Blood Culture results may not be optimal due to an inadequate volume of blood received in culture bottles   Culture   Final    NO GROWTH 3 DAYS Performed at [facility], [address], [place], [place] [address]    Report Status PENDING  Incomplete  Blood Culture (routine x 2)     Status: None (Preliminary result)   Collection Time: 09/17/23 12:19 PM   Specimen: BLOOD  Result Value Ref Range Status   Specimen Description BLOOD LEFT ANTECUBITAL  Final   Special Requests   Final    BOTTLES DRAWN AEROBIC AND ANAEROBIC Blood Culture adequate volume   Culture   Final    NO GROWTH 3 DAYS Performed at [facility], [address], [place], [place] [address]    Report Status PENDING  Incomplete  Urine Culture     Status: None  Collection Time: 09/17/23 12:22 PM   Specimen: Urine, Random  Result Value Ref Range Status   Specimen Description   Final    URINE, RANDOM Performed at [facility], [address] [place], [place] [address]    Special Requests   Final    NONE Performed at [facility], [address] [place], [place] [address]    Culture   Final    NO GROWTH Performed at [facility], [address] [place], [place] [address]    Report Status 09/18/2023 FINAL  Final  Resp panel by RT-PCR (RSV, Flu A&B, Covid) Urine, Catheterized     Status: None   Collection Time: 09/17/23 12:23 PM   Specimen: Urine, Catheterized; Nasal Swab  Result Value Ref Range Status   SARS Coronavirus 2 by RT PCR NEGATIVE NEGATIVE Final    Comment: (NOTE) SARS-CoV-2 target nucleic acids are NOT DETECTED.  The SARS-CoV-2 RNA is generally detectable in upper respiratory specimens during the acute phase of infection. The lowest concentration of SARS-CoV-2 viral copies this assay can detect is 138 copies/mL. A negative result does not preclude SARS-Cov-2 infection and should not be used as the sole basis for treatment or other patient management decisions. A negative result may occur with  improper specimen collection/handling, submission of specimen other than nasopharyngeal swab, presence of viral mutation(s) within the areas targeted by this assay, and inadequate number of viral copies(<138 copies/mL). A negative result must be combined with clinical observations, patient history, and epidemiological information. The expected result is Negative.  Fact Sheet for Patients:  bloggercourse.com  Fact Sheet for Healthcare Providers:  seriousbroker.it  This test is no t yet approved or cleared by the [place]  FDA and  has been authorized for detection and/or diagnosis of SARS-CoV-2 by FDA under an Emergency Use Authorization (EUA). This EUA will remain  in effect (meaning this test can be used) for the duration of the COVID-19 declaration under Section 564(b)(1) of the Act, 21 U.S.C.section 360bbb-3(b)(1), unless the authorization is terminated  or revoked sooner.       Influenza A by PCR NEGATIVE NEGATIVE Final   Influenza B by  PCR NEGATIVE NEGATIVE Final    Comment: (NOTE) The Xpert Xpress SARS-CoV-2/FLU/RSV plus assay is intended as an aid in the diagnosis of influenza from Nasopharyngeal swab specimens and should not be used as a sole basis for treatment. Nasal washings and aspirates are unacceptable for Xpert Xpress SARS-CoV-2/FLU/RSV testing.  Fact Sheet for Patients: bloggercourse.com  Fact Sheet for Healthcare Providers: seriousbroker.it  This test is not yet approved or cleared by the [place]  FDA and has been authorized for detection and/or diagnosis of SARS-CoV-2 by FDA under an Emergency Use Authorization (EUA). This EUA will remain in effect (meaning this test can be used) for the duration of the COVID-19 declaration under Section 564(b)(1) of the Act, 21 U.S.C. section 360bbb-3(b)(1), unless the authorization is terminated or revoked.     Resp Syncytial Virus by PCR NEGATIVE NEGATIVE Final    Comment: (NOTE) Fact Sheet for Patients: bloggercourse.com  Fact Sheet for Healthcare Providers: seriousbroker.it  This test is not yet approved or cleared by the [place]  FDA and has been authorized for detection and/or diagnosis of SARS-CoV-2 by FDA under an Emergency Use Authorization (EUA). This EUA will remain in effect (meaning this test can be used) for the duration of the COVID-19 declaration under Section 564(b)(1) of the Act, 21 U.S.C. section 360bbb-3(b)(1), unless the authorization is  terminated or revoked.  Performed at [facility], [address] [place], [place] [address]   MRSA Next Gen by PCR, Nasal     Status: None   Collection Time: 09/17/23  5:21 PM   Specimen: Nasal Mucosa; Nasal Swab  Result Value Ref Range Status   MRSA by PCR Next Gen NOT DETECTED NOT DETECTED Final    Comment: (NOTE) The GeneXpert MRSA Assay (FDA approved for NASAL specimens  only), is one component of a comprehensive MRSA colonization surveillance program. It is not intended to diagnose MRSA infection nor to guide or monitor treatment for MRSA infections. Test performance is not FDA approved in patients less than 9 years old. Performed at [facility], [address] [place], [place] [address]   Gastrointestinal Panel by PCR , Stool     Status: Abnormal   Collection Time: 09/19/23  3:50 AM   Specimen: Stool  Result Value Ref Range Status   Campylobacter species NOT DETECTED NOT DETECTED Final   Plesimonas shigelloides NOT DETECTED NOT DETECTED Final   Salmonella species NOT DETECTED NOT DETECTED Final   Yersinia enterocolitica NOT DETECTED NOT DETECTED Final   Vibrio species NOT DETECTED NOT DETECTED Final   Vibrio cholerae NOT DETECTED NOT DETECTED Final   Enteroaggregative E coli (EAEC) NOT DETECTED NOT DETECTED Final   Enteropathogenic E coli (EPEC) NOT DETECTED NOT DETECTED Final   Enterotoxigenic E coli (ETEC) NOT DETECTED NOT DETECTED Final   Shiga like toxin producing E coli (STEC) NOT DETECTED NOT DETECTED Final   Shigella/Enteroinvasive E coli (EIEC) NOT DETECTED NOT DETECTED Final   Cryptosporidium NOT DETECTED NOT DETECTED Final   Cyclospora cayetanensis NOT DETECTED NOT DETECTED Final   Entamoeba histolytica NOT DETECTED NOT DETECTED Final   Giardia lamblia NOT DETECTED NOT DETECTED Final   Adenovirus F40/41 NOT DETECTED NOT DETECTED Final   Astrovirus NOT DETECTED NOT DETECTED Final   Norovirus GI/GII DETECTED (A) NOT DETECTED Final    Comment: RESULT CALLED TO, READ BACK BY AND VERIFIED WITH: GINA SHANNON 09/19/2023 AT 0801 SRR    Rotavirus A NOT DETECTED NOT DETECTED Final   Sapovirus (I, II, IV, and V) NOT DETECTED NOT DETECTED Final    Comment: Performed at [facility], [address] [place], [place] [address]    Time spent: 37 minutes  Signed: Concepcion Riser, MD 2023-10-05

## 2023-10-10 DEATH — deceased

## 2023-12-21 ENCOUNTER — Ambulatory Visit: Payer: PPO | Admitting: Internal Medicine
